# Patient Record
Sex: Male | Born: 1945 | Race: White | Hispanic: No | Marital: Single | Smoking: Current every day smoker
Health system: Southern US, Community
[De-identification: ages and names within clinical notes are randomized; demographics above are authoritative.]

## PROBLEM LIST (undated history)

## (undated) ENCOUNTER — Emergency Department (HOSPITAL_COMMUNITY): Discharge status: Absent without leave | Payer: Medicare Other

## (undated) DIAGNOSIS — I4891 Unspecified atrial fibrillation: Secondary | ICD-10-CM

## (undated) DIAGNOSIS — E785 Hyperlipidemia, unspecified: Secondary | ICD-10-CM

## (undated) DIAGNOSIS — K922 Gastrointestinal hemorrhage, unspecified: Secondary | ICD-10-CM

## (undated) DIAGNOSIS — Y92129 Unspecified place in nursing home as the place of occurrence of the external cause: Secondary | ICD-10-CM

## (undated) DIAGNOSIS — I10 Essential (primary) hypertension: Secondary | ICD-10-CM

## (undated) DIAGNOSIS — I442 Atrioventricular block, complete: Secondary | ICD-10-CM

## (undated) DIAGNOSIS — Z8669 Personal history of other diseases of the nervous system and sense organs: Secondary | ICD-10-CM

## (undated) DIAGNOSIS — F32A Depression, unspecified: Secondary | ICD-10-CM

## (undated) DIAGNOSIS — J690 Pneumonitis due to inhalation of food and vomit: Secondary | ICD-10-CM

## (undated) DIAGNOSIS — S72043A Displaced fracture of base of neck of unspecified femur, initial encounter for closed fracture: Secondary | ICD-10-CM

## (undated) DIAGNOSIS — I482 Chronic atrial fibrillation, unspecified: Secondary | ICD-10-CM

## (undated) DIAGNOSIS — K259 Gastric ulcer, unspecified as acute or chronic, without hemorrhage or perforation: Secondary | ICD-10-CM

## (undated) DIAGNOSIS — I428 Other cardiomyopathies: Secondary | ICD-10-CM

## (undated) DIAGNOSIS — M109 Gout, unspecified: Secondary | ICD-10-CM

## (undated) DIAGNOSIS — R079 Chest pain, unspecified: Secondary | ICD-10-CM

## (undated) DIAGNOSIS — D369 Benign neoplasm, unspecified site: Secondary | ICD-10-CM

## (undated) DIAGNOSIS — Z765 Malingerer [conscious simulation]: Secondary | ICD-10-CM

## (undated) DIAGNOSIS — I208 Other forms of angina pectoris: Secondary | ICD-10-CM

## (undated) DIAGNOSIS — G8929 Other chronic pain: Secondary | ICD-10-CM

## (undated) DIAGNOSIS — D126 Benign neoplasm of colon, unspecified: Secondary | ICD-10-CM

## (undated) DIAGNOSIS — F329 Major depressive disorder, single episode, unspecified: Secondary | ICD-10-CM

## (undated) DIAGNOSIS — W19XXXA Unspecified fall, initial encounter: Secondary | ICD-10-CM

## (undated) DIAGNOSIS — I700 Atherosclerosis of aorta: Secondary | ICD-10-CM

## (undated) DIAGNOSIS — C801 Malignant (primary) neoplasm, unspecified: Secondary | ICD-10-CM

## (undated) DIAGNOSIS — N400 Enlarged prostate without lower urinary tract symptoms: Secondary | ICD-10-CM

## (undated) DIAGNOSIS — C8590 Non-Hodgkin lymphoma, unspecified, unspecified site: Secondary | ICD-10-CM

## (undated) HISTORY — PX: APPENDECTOMY: SHX54

## (undated) HISTORY — PX: GASTRIC RESECTION: SHX5248

## (undated) HISTORY — PX: PARTIAL COLECTOMY: SHX5273

## (undated) HISTORY — PX: ESOPHAGOGASTRODUODENOSCOPY: SHX1529

## (undated) HISTORY — PX: PACEMAKER PLACEMENT: SHX43

## (undated) HISTORY — PX: CHOLECYSTECTOMY: SHX55

## (undated) HISTORY — PX: PACEMAKER GENERATOR CHANGE: SHX5998

## (undated) HISTORY — DX: Non-Hodgkin lymphoma, unspecified, unspecified site: C85.90

## (undated) HISTORY — DX: Gastric ulcer, unspecified as acute or chronic, without hemorrhage or perforation: K25.9

## (undated) HISTORY — DX: Benign neoplasm of colon, unspecified: D12.6

---

## 2006-10-26 ENCOUNTER — Encounter (INDEPENDENT_AMBULATORY_CARE_PROVIDER_SITE_OTHER): Payer: Self-pay | Admitting: Internal Medicine

## 2006-10-26 ENCOUNTER — Ambulatory Visit: Payer: Self-pay | Admitting: Cardiology

## 2006-10-26 ENCOUNTER — Inpatient Hospital Stay (HOSPITAL_COMMUNITY): Admission: EM | Admit: 2006-10-26 | Discharge: 2006-11-14 | Payer: Self-pay | Admitting: Emergency Medicine

## 2006-10-28 ENCOUNTER — Ambulatory Visit: Payer: Self-pay | Admitting: Oncology

## 2006-10-30 ENCOUNTER — Ambulatory Visit: Payer: Self-pay | Admitting: Internal Medicine

## 2006-11-04 ENCOUNTER — Encounter: Payer: Self-pay | Admitting: Internal Medicine

## 2006-12-24 ENCOUNTER — Ambulatory Visit: Payer: Self-pay | Admitting: Internal Medicine

## 2006-12-24 ENCOUNTER — Encounter: Payer: Self-pay | Admitting: Internal Medicine

## 2006-12-24 DIAGNOSIS — I700 Atherosclerosis of aorta: Secondary | ICD-10-CM

## 2006-12-24 DIAGNOSIS — F8189 Other developmental disorders of scholastic skills: Secondary | ICD-10-CM | POA: Insufficient documentation

## 2006-12-24 DIAGNOSIS — I4891 Unspecified atrial fibrillation: Secondary | ICD-10-CM

## 2006-12-24 DIAGNOSIS — I428 Other cardiomyopathies: Secondary | ICD-10-CM

## 2006-12-24 DIAGNOSIS — Z9189 Other specified personal risk factors, not elsewhere classified: Secondary | ICD-10-CM

## 2006-12-24 DIAGNOSIS — I259 Chronic ischemic heart disease, unspecified: Secondary | ICD-10-CM

## 2006-12-24 DIAGNOSIS — I442 Atrioventricular block, complete: Secondary | ICD-10-CM

## 2006-12-24 DIAGNOSIS — N189 Chronic kidney disease, unspecified: Secondary | ICD-10-CM

## 2006-12-24 DIAGNOSIS — F321 Major depressive disorder, single episode, moderate: Secondary | ICD-10-CM | POA: Insufficient documentation

## 2006-12-24 DIAGNOSIS — Z9089 Acquired absence of other organs: Secondary | ICD-10-CM

## 2006-12-24 DIAGNOSIS — D696 Thrombocytopenia, unspecified: Secondary | ICD-10-CM | POA: Insufficient documentation

## 2006-12-24 HISTORY — DX: Atherosclerosis of aorta: I70.0

## 2006-12-24 HISTORY — DX: Atrioventricular block, complete: I44.2

## 2006-12-24 HISTORY — DX: Other cardiomyopathies: I42.8

## 2006-12-24 LAB — CONVERTED CEMR LAB
Albumin: 4 g/dL (ref 3.5–5.2)
Alkaline Phosphatase: 76 units/L (ref 39–117)
BUN: 22 mg/dL (ref 6–23)
Basophils Absolute: 0 10*3/uL (ref 0.0–0.1)
CO2: 30 meq/L (ref 19–32)
Eosinophils Relative: 3 % (ref 0–5)
Glucose, Bld: 107 mg/dL — ABNORMAL HIGH (ref 70–99)
HCT: 37.5 % — ABNORMAL LOW (ref 39.0–52.0)
Lymphocytes Relative: 41 % (ref 12–46)
Lymphs Abs: 2.4 10*3/uL (ref 0.7–3.3)
Magnesium: 2 mg/dL (ref 1.5–2.5)
Neutro Abs: 2.8 10*3/uL (ref 1.7–7.7)
Platelets: 159 10*3/uL (ref 150–400)
Potassium: 4.9 meq/L (ref 3.5–5.3)
RBC Folate: 1282 ng/mL — ABNORMAL HIGH (ref 180–600)
Total Bilirubin: 0.3 mg/dL (ref 0.3–1.2)
Vitamin B-12: 634 pg/mL (ref 211–911)
WBC: 5.8 10*3/uL (ref 4.0–10.5)

## 2007-01-02 ENCOUNTER — Ambulatory Visit: Payer: Self-pay

## 2007-04-07 ENCOUNTER — Ambulatory Visit: Payer: Self-pay | Admitting: Internal Medicine

## 2007-09-30 ENCOUNTER — Emergency Department (HOSPITAL_COMMUNITY): Admission: EM | Admit: 2007-09-30 | Discharge: 2007-09-30 | Payer: Self-pay | Admitting: Emergency Medicine

## 2007-11-03 ENCOUNTER — Ambulatory Visit: Payer: Self-pay | Admitting: Internal Medicine

## 2010-02-06 ENCOUNTER — Encounter (INDEPENDENT_AMBULATORY_CARE_PROVIDER_SITE_OTHER): Payer: Self-pay | Admitting: *Deleted

## 2010-03-08 ENCOUNTER — Encounter: Payer: Self-pay | Admitting: Internal Medicine

## 2010-03-09 ENCOUNTER — Encounter: Payer: Self-pay | Admitting: Internal Medicine

## 2010-03-14 ENCOUNTER — Ambulatory Visit: Payer: Self-pay | Admitting: Internal Medicine

## 2010-07-12 NOTE — Cardiovascular Report (Signed)
Summary: Office Visit   Office Visit   Imported By: Roderic Ovens 04/09/2010 12:51:00  _____________________________________________________________________  External Attachment:    Type:   Image     Comment:   External Document

## 2010-07-12 NOTE — Procedures (Signed)
Summary: Cardiology Device Clinic   Current Medications (verified): 1)  Adult Aspirin Ec Low Strength 81 Mg  Tbec (Aspirin) .... Take 1 Tablet By Mouth Once A Day 2)  Carvedilol 3.125 Mg  Tabs (Carvedilol) .... Take 1 Tablet By Mouth Two Times A Day 3)  Zyprexa 5 Mg  Tabs (Olanzapine) .... Take 1 Tablet By Mouth Once A Day At Bedtime 4)  Citalopram Hydrobromide 20 Mg  Tabs (Citalopram Hydrobromide) .... Take 1 Tablet By Mouth Once A Day in Am 5)  Mag-Ox 400 400 Mg  Tabs (Magnesium Oxide) .... Take 1 Tablet By Mouth Two Times A Day 6)  Os-Cal 500 + D 500-200 Mg-Unit  Tabs (Calcium Carbonate-Vitamin D) .... Take 1 Tablet By Mouth Two Times A Day 7)  Docusate Sodium 100 Mg  Caps (Docusate Sodium) .... Take 1 Tablet By Mouth Once A Day At Bedtime As Needed For Constipation  Allergies (verified): No Known Drug Allergies  PPM Specifications Following MD:  Sherryl Manges, MD     PPM Vendor:  St Jude     PPM Model Number:  [id number]     PPM Serial Number:  [id number] PPM DOI:  10/31/2006     PPM Implanting MD:  Lewayne Bunting, MD  Lead 1    Location: RV     DOI: 01/21/1990     Model #: [id number]-60     Serial #: [id number]     Status: active  PPM Follow Up Battery Voltage:  2.78 V     Battery Est. Longevity:  >10 YRS     Right Ventricle  Amplitude: 6.9 mV, Impedance: 372 ohms, Threshold: 0.875 V at 0.5 msec  Episodes MS Episodes:  0     Ventricular High Rate:  0     Ventricular Pacing:  13%  Parameters Mode:  VVI     Lower Rate Limit:  50     Next Cardiology Appt Due:  09/10/2010 Tech Comments:  NORMAL DEVICE FUNCTION. NO EPISODES SINCE LAST CHECK. ROV IN 6 MTHS W/DEVICE CLINIC. Vella Kohler  March 14, 2010 8:54 AM

## 2010-07-12 NOTE — Cardiovascular Report (Signed)
Summary: Certified Letter Signed - Other (no appt. made)  Certified Letter Signed - Other (no appt. made)   Imported By: Debby Freiberg 04/17/2010 13:07:19  _____________________________________________________________________  External Attachment:    Type:   Image     Comment:   External Document

## 2010-07-12 NOTE — Letter (Signed)
Summary: Device-Delinquent Check  [facility], Main Office  [address]   [place], [place] [address]   Phone: [phone]  Fax: [phone]     February 06, 2010 MRN: [id number]   [name] [address] [place], [place]  [address]   Dear [name],  According to our records, you have not had your implanted device checked in the recommended period of time.  We are unable to determine appropriate device function without checking your device on a regular basis.  Please call our office to schedule an appointment, with Dr. Graciela Husbands,  as soon as possible.  If you are having your device checked by another physician, please call us so that we may update our records.  Thank you,  Altha Harm, LPN  February 06, 2010 8:42 AM  [facility] certified mail

## 2010-10-23 NOTE — Discharge Summary (Signed)
NAMEJULLIAN, [name]                 ACCOUNT NO.:  [id number]   MEDICAL RECORD NO.:  [id number]          PATIENT TYPE:  INP   LOCATION:  A209                          FACILITY:  [facility]   PHYSICIAN:  Dorris Singh, DO    DATE OF BIRTH:  1945/12/20   DATE OF ADMISSION:  10/25/2006  DATE OF DISCHARGE:  LH                               DISCHARGE SUMMARY   DISCHARGE DATE:  May be Oct 30, 2006.  The patient will be discharged  via [facility].   ADMISSION DIAGNOSES:  1. Dehydration.  2. Altered mental status.  3. Atrial fibrillation.  4. Status post pacemaker.  5. Malnutrition.  6. Patient was homeless, indigent.   DISCHARGE DIAGNOSES:  1. Atrial fibrillation with AV block.  2. Malnutrition.  3. Behavioral disorder.  4. Hypokalemia.   While the patient was here, he was seen by several specialists, the  consults include:  1. Cardiology.  2. Nephrology.  3. Hematology.   Procedures that were done while the patient was here include:  1. Abdominal series.  2. CT of the chest.  3. Chest x-ray.  4. CT of the head without contrast.  5. CT of the abdomen without contrast.  These tests were done.  The abdominal x-ray and chest x-ray were done on  Oct 26, 2006.  And, the CT of the abdomen, head, and chest were all done  on Oct 27, 2006.   HISTORY AND PHYSICAL:  [name] was a 65 year old [name] with an  unknown past medical history who was brought here by EMS and police.  Per the report of EMS and the emergency room physician, the patient was  living with his girlfriend till recently, when his girlfriend was placed  in a nursing home.  He was forced out of his girlfriend's home by his  girlfriend's daughter because he was unable to maintain his personal  hygiene.  He was at the [facility] and the owner of the [facility] called the police and EMS brought him to the emergency room.  It  was stated, the patient had not eaten for several days.  Apparently, he  had been  living in a barn off and on.  It is not known if he was taking  any medication.  Upon admission the patient was very weak and unable to  give any responses at that point in time.   While he was in the hospital, due to his labs, the patient was  determined to be in renal failure possibly due to severe dehydration  with an elevated BUN and creatinine.  He was hydrated and then assessed  by nutritional services and given the appropriate diet.  He also was  sent for multiple studies.  A CT of the head, chest, and abdomen.  Cardiology followed him regarding his AFib which was controlled on  medication.  The patient still would not answer questions appropriately,  would tell people that he just wanted to sleep but as he became more  hydrated and his renal function returned, the patient became more  responsive.  At  that point in time it was decided that the patient  should be evaluated by our ACT Team and social services for possible  placement due to his emaciated appearance.  It was determined that maybe  PT OT should also consult and participate.  While the patient was here,  he experienced several pauses of asymptomatic bradycardia as well as  several AV nodal pauses.  It was determined that the patient's pacemaker  was not working and that here at [facility] the cardiologists  do not place pacemakers, that the patient would need to be transferred.  Also, from the ACT Team evaluation it was recommended that the patient  be evaluated by psychiatric services.  [facility] does not have  these services here, so it was determined that the patient should be  transferred via [facility] and that I spoke with Dr. Renae Fickle and the patient  would be accepted on their team A and that the patient would be  transferred to be evaluated by these services at [facility].      Dorris Singh, DO  Electronically Signed     CB/MEDQ  D:  10/30/2006  T:  10/30/2006  Job:  [id number]

## 2010-10-23 NOTE — Consult Note (Signed)
NAMEJUMAR, GREENSTREET NO.:  [id number]   MEDICAL RECORD NO.:  [id number]          PATIENT TYPE:  INP   LOCATION:  2036                         FACILITY:  [facility]   PHYSICIAN:  Antonietta Breach, M.D.  DATE OF BIRTH:  [birth year]   DATE OF CONSULTATION:  11/11/2006  DATE OF DISCHARGE:                                 CONSULTATION   [name] continues with very low energy, social withdrawal, poverty of  speech, lying in his hospital bed.  He does acknowledge depressed mood  and poor appetite as well as lack of interests.  He denies any  hallucinations.  He does appear to have some suspiciousness, however,  there is no evidence that he is having systematic delusions or frank  paranoia.   LABORATORY DATA:  WBC 6.5, hemoglobin 10.6, platelet count 174,000.  On  Nov 04, 2006: BUN 24, creatinine 0.88.   MENTAL STATUS EXAM:  [name] is lying in the left lateral decubitus  position in his hospital bed with poor eye contact.  On orientation  testing he does not know the year, he looks up on the wall, finds the  calendar to name the month.  He does know he is in the hospital.  He is  oriented to person.  His thought process is coherent.  Thought content,  as described above, there is poverty of speech.  Affect is constricted.  Mood is irritable.  Judgment is impaired.  He clearly lacks the  appreciation of his condition as well as the environmental support that  would be required for sustaining life.   ASSESSMENT:  1. 293.83  Mood disorder not otherwise specified, depressed.  2. Rule out 296.33  Major depressive disorder, recurrent, severe.  3. Rule out psychotic disorder not otherwise specified, 293.81 with      delusions.  As mentioned above, there is no clear evidence that the      patient is having hallucinations.  He does appear suspicious but      not to the degree of fixed paranoia or systematic delusions.   [name] does demonstrate cognitive impairment as well as  impairment in  the ability to appreciate his condition and the environmental support  that would be required.  He does not have the capacity for informed  consent.   RECOMMENDATIONS:  1. Would start Celexa for antidepression at 5 mg q. a.m., if      tolerated, with an increase by 5 mg per day to an initial trial      dose of 20 mg q. a.m.  2. Would continue to monitor the patient for the need to start an      antipsychotic; however, will defer this at this point.      Antonietta Breach, M.D.  Electronically Signed     JW/MEDQ  D:  11/11/2006  T:  11/11/2006  Job:  [id number]

## 2010-10-23 NOTE — Assessment & Plan Note (Signed)
[facility]                         ELECTROPHYSIOLOGY OFFICE NOTE   NAME:MABESamarth, [name]                        MRN:          [id number]  DATE:11/03/2007                            DOB:          03/22/1946    [name] is status post pacemaker implantation for complete heart block  in the setting of atrial fibrillation.  He denies problems with  lightheadedness.  He has chronic shortness of breath.  He continues to  smoke.   His other complaint is sleeping disturbance.   MEDICATIONS:  1. Carvedilol 3.125 b.i.d.  2. Aspirin.  3. Zyprexa.   PHYSICAL EXAMINATION:  VITAL SIGNS:  Blood pressure 128/81, his pulse  was 76.  LUNGS:  Clear.  CARDIAC:  His heart sounds were regular.  EXTREMITIES:  Without edema.  His weight was 118, up 10 pounds since October.   Interrogation of his St. Jude Verity pulse generator demonstrates an R-  wave of 5 with impedance of 391 and a threshold of 1 volt at 0.5.   IMPRESSION:  1. Atrial fibrillation - permanent.  2. Complete heart block/bradycardia.  3. Status post pacemaker for the above.  4. Chest pain, declining further workup.  5. No Coumadin secondary to patient unwillingness.   [name] is stable from an arrhythmia point of view.  Risk factors  remain but modification is not something he is willing to undertake.   We will see him again in six months' time.     [name], MD, [facility]  Electronically Signed    SCK/MedQ  DD: 11/03/2007  DT: 11/03/2007  Job #: [id number]   cc:   [facility]

## 2010-10-23 NOTE — Op Note (Signed)
NAMEMALIQ, [name]                 ACCOUNT NO.:  [id number]   MEDICAL RECORD NO.:  [id number]          PATIENT TYPE:  INP   LOCATION:  2036                         FACILITY:  [facility]   PHYSICIAN:  Doylene Canning. Ladona Ridgel, MD    DATE OF BIRTH:  Sep 10, 1945   DATE OF PROCEDURE:  10/31/2006  DATE OF DISCHARGE:                               OPERATIVE REPORT   PROCEDURE PERFORMED:  Removal of a previously implanted single-chamber  pacemaker and insertion of a new single-chamber pacemaker secondary to  the device being at end of life.   SURGEON:  Doylene Canning. Ladona Ridgel, MD   I. INTRODUCTION:  The patient is a 65 year old male with a history of  symptomatic bradycardia and chronic atrial fibrillation, who was  admitted to the hospital and had periods of bradycardia along with  multiple other medical problems and is transferred down for pacemaker  generator change.   II. PROCEDURE:  After informed consent was obtained, the patient was  taken to the diagnostic EP lab in the fasting state.  After the usual  preparation and draping, intravenous fentanyl and midazolam were given  for sedation.  Thirty milliliters of lidocaine were infiltrated into the  right infraclavicular region over the old pacemaker insertion site.  A 5-  cm incision was carried out over this region and electrocautery utilized  to dissect down to the pacemaker pocket.  The generator was removed  without difficulty.  The old ventricular pacing lead was evaluated.  The  R waves were measured between 4 and 5 mV and the pacing impedance  (bipolar) was 764 ohms with a threshold of 1.6 volts at 0.5  milliseconds.  With these satisfactory parameters, the new St. Jude  Verity ADx XL SR single-chamber pacemaker, serial number [id number], was  connected to the previous pacing lead and placed back in the  subcutaneous pocket.  The pocket was irrigated with kanamycin and the  incision closed with a layer of 2-0 Vicryl followed by a layer of 3-0  Vicryl,  followed by a layer of 4-0 Vicryl.  Benzoin was painted on the  skin, Steri-Strips were applied and a pressure dressing was placed, and  the patient was returned to the his room in satisfactory condition.   III. COMPLICATIONS:  There were no immediate procedural complications.   IV. RESULTS:  This demonstrated successful pacemaker generator removal  and insertion in a patient with symptomatic bradycardia.      Doylene Canning. Ladona Ridgel, MD  Electronically Signed     GWT/MEDQ  D:  10/31/2006  T:  10/31/2006  Job:  [id number]   cc:   Gerrit Friends. Dietrich Pates, MD, [facility]

## 2010-10-23 NOTE — H&P (Signed)
[name], [name]                 ACCOUNT NO.:  [id number]   MEDICAL RECORD NO.:  [id number]          PATIENT TYPE:  INP   LOCATION:  A209                          FACILITY:  [facility]   PHYSICIAN:  Marcello Moores, MD   DATE OF BIRTH:  1946-04-10   DATE OF ADMISSION:  10/25/2006  DATE OF DISCHARGE:  LH                              HISTORY & PHYSICAL   PRIMARY CARE PHYSICIAN:  Unassigned.   CHIEF COMPLAINT:  He was brought by EMS and police from [facility].   HISTORY OF PRESENT ILLNESS:  [name] be is a 65 year old male patient  with unknown past medical history who was brought today early in the  morning by EMS and police.  Per the report of the EMS, police, and  emergency room physician, the patient was living with his girlfriend  until recently, and his girlfriend was placed in a nursing home.  He was  forced out of his girlfriend's and her daughter's house because he was  unable to maintain his personal hygiene.  He was at [facility], and  the owner of the [facility] called the police, and EMS brought him  to the emergency room. The patient was not eating for the last several  days, and he was dehydrated, and he was unable to answer for the  interview.  At this time, we do not know if he was taking any  medication, if he was following up with any medical doctor. The patient  is alert but very lethargic and weak and unable to give any reasonable  medical or social history.  Currently, the computer system is also in  repair, and I was unable to have access if he has any past medical  history in our hospital.   REVIEW OF SYSTEMS:  A 10-point review of system is limited, as I  mentioned in the HPI.   ALLERGIES:  Unknown.   PAST MEDICAL HISTORY:  Unknown.   SOCIAL HISTORY:  Homeless currently, otherwise unknown.   PHYSICAL EXAMINATION:  GENERAL:  The patient is lying on the bed, very  emaciated and cachectic without any respiratory distress, unable to give  any  reasonable history during the attempted interview.  VITAL SIGNS:  Initial blood pressure is 136/80. Pulse rate is 92.  Temperature is 99.4, and respiratory rate is 20 per minute. Saturation  is 96% on room air.  HEENT: He has pink conjunctivae.  Nonicteric sclera.  Very dry buccal  mucosa. Sunken periorbital area and prominent fascial bones with poor  hygiene.  NECK:  Supple.  CHEST: Good air entry bilaterally.  No wheezes or rhonchi.  CARDIOVASCULAR:  S1-S2 heard, irregular.  No murmur is appreciated, and  there is probable pacemaker on the right side of the chest.  ABDOMEN:  Scaphoid.  No organomegaly is appreciated in the area of  tenderness.  Normoactive bowel sounds.  EXTREMITIES:  emaciated.  Old scar on the right knee area.  CNS:  He is pale, alert but very weak and lethargic, but there is not  any neurological deficit on examination.   LABORATORY DATA:  Chemistries:  Sodium is 148, potassium 3.9, chloride  104, bicarb 23, and glucose is 251. BUN is 85, creatinine 1.97, calcium  9.9. Total protein is 8.6. Albumin is 4. Alcohol level is less than 5.  On the CBC, white blood cell count is 7.8, hemoglobin  15.38, hematocrit  48, RDW 14.5, platelet count 151, neutrophil 76%. CK-MB is 1.9, and  troponin is 0.06.   Chest x-ray:  COPD.  Otherwise there is no sign of infiltrate.   ASSESSMENT:  1. Dehydration with elevated BUN and creatinine, elevated sodium      level, elevated hemoglobin and hematocrit, and elevated protein and      albumin.  Probably all these elevated lab results are related to      dehydration. We will rehydrate him with IV fluid, and we will      monitor the labs.  2. Altered mental status.  The patient is alert, but he is very      lethargic and not well oriented.  Probably this is related to his      dehydration.  3. Atrial fibrillation.  The patient received IV dose of Cardizem in      the emergency room,  and the rate is well-controlled.  We will       continue with p.o. Cardizem and will consult cardiology also for      long-term management of his problem.  4. Status post pacemaker.  5. malnutrition. The patient is very emaciated and cachectic. Probably      this is secondary to food deprivation, but will investigate also      for possible medical cause of the weight loss or cancer with CT OF      ABDOMEN CHEST AND BRAIN .  6. Homeless, and he needs Presenter, broadcasting. Probably he might      need also psychiatric evaluation.      Marcello Moores, MD  Electronically Signed     MT/MEDQ  D:  10/26/2006  T:  10/26/2006  Job:  [id number]

## 2010-10-23 NOTE — Group Therapy Note (Signed)
[name], [name]                 ACCOUNT NO.:  [id number]   MEDICAL RECORD NO.:  [id number]          PATIENT TYPE:  INP   LOCATION:  A209                          FACILITY:  [facility]   PHYSICIAN:  Dorris Singh, DO    DATE OF BIRTH:  07/10/1945   DATE OF PROCEDURE:  DATE OF DISCHARGE:                                 PROGRESS NOTE   PROGRESS REPORT   This is for a note of [facility].   The patient is seen, examined, chart reviewed.  Have been monitoring  patient since AV block.  Has not had another episode.  Patient has been  seen by social work and cardiology.  Cardiology notes state that  possible replacement of pacemaker is in order due to the pacemaker not  functioning during a pause the patient had on 05/20.  Also spoke with  social work services; they stated that they could find placement for  patient here locally in town that can assist him with his medical needs  as well as his nutritional needs.   Vital signs for [name] are 97.5, 65, 20, 100/58.  Generally he is a  cachectic, emaciated, [name], who is in no acute distress, who  answers some questions, but will close eyes and not answer some.  HEENT  EOMI.  PERRLA.  The patient has a beard, and hair is in disarray.  No  lice or parasites noted.  Cardio regular rate and rhythm, without  murmur.  Pulmonary clear to auscultation bilaterally.  Abdomen flat,  nontender.  No organomegaly.  Bowel sounds times 4.  Extremities are  thin, cachectic, pulse within all 4 quadrants.   Labs that he had done, for today he had a CBC of WBC 5.2, hemoglobin  10.3, hematocrit 28.5, platelet 67.  His ESR was 40.  Had a CMP done.  Sodium was 142, potassium 2.9 which is low, chloride 106.  CO2 32.  Glucose 118.  BUN 9, and creatinine 0.5.  Also, his albumin __________  was 2.0 which is low, and his calcium 7.3 which is low.  His phosphorus  was 2.2.   IMPRESSION AND PLAN:  Renal failure, which is currently stable.  Hypokalemia.  We  will replace with KCl, and decrease IV rate.  AFib with  block.  Cardiology recommends pacemaker replacement.  Await consent of  patient.  Malnutritional state.  Per nutritional assessment, patient has  other severe protein caloric malnutrition.  We will continue with the  recommendations of the nutritionist for meal planning.  Social work has  found placement for patient at a local facility, a nursing home  facility; await patient consent.  PT OT continues participation.  Await  ACT team evaluation of the patient for any further recommendation.  We  will continue to monitor, and we will replace calcium at this time with  oral supplementation.      Dorris Singh, DO  Electronically Signed    CB/MEDQ  D:  10/29/2006  T:  10/29/2006  Job:  [id number]

## 2010-10-23 NOTE — Discharge Summary (Signed)
NAMEMERL, BOMMARITO                 ACCOUNT NO.:  [id number]   MEDICAL RECORD NO.:  [id number]          PATIENT TYPE:  INP   LOCATION:  2036                         FACILITY:  [facility]   PHYSICIAN:  Beckey Rutter, MD  DATE OF BIRTH:  [birth year]   DATE OF ADMISSION:  10/30/2006  DATE OF DISCHARGE:                               DISCHARGE SUMMARY   ADDENDUM TO DISCHARGE SUMMARY [phone]   1. The patient will be discharged on Zyprexa p.o. 5 mg at night, which      will be added to the above list of the medications.  2. The patient's history of HIV will be added to the additional      diagnoses.  The HIV preliminary tests seem to be stable as per the      medical chart.  During hospitalization, he remained stable in that      medical problem and the recommendation is to follow up with      infectious disease or primary for further assessment and manage his      HIV.      Beckey Rutter, MD  Electronically Signed     EME/MEDQ  D:  11/11/2006  T:  11/11/2006  Job:  [id number]

## 2010-10-23 NOTE — Consult Note (Signed)
[name], [name]                 ACCOUNT NO.:  [id number]   MEDICAL RECORD NO.:  [id number]          PATIENT TYPE:  INP   LOCATION:  A209                          FACILITY:  [facility]   PHYSICIAN:  Ladona Horns. Mariel Sleet, MD  DATE OF BIRTH:  [birth year]   DATE OF CONSULTATION:  10/27/2006  DATE OF DISCHARGE:                                 CONSULTATION   DIAGNOSES:  1. Thrombocytopenia, though he actually had a normal platelet count      yesterday.  His platelets from today was found to be low.  2. Possible mental health illness.  3. Profound cachexia.  4. Renal insufficiency, primarily prerenal, with a BUN of 85,      creatinine 1.97.  5. Elevated total protein, and we do need to exclude myeloma, however.   HISTORY:  This is a somewhat quiet gentleman who was brought to the  hospital by the police.  He [place], does not remember how many years he is done that, but he was  supposedly living with a young woman pillow she became placed in a  nursing home herself.   I believe he showed up at [facility] and that is where the police  picked him up and brought him to the hospital.  He does not remember the  last time he has eaten and he has not been able to answer the interview  at the time of the admission, and he is not a very good reporter of  circumstances now, either.   He did not know the year.  He did not know the last time he had  something to eat or drink.  He knew that he smoked years ago.  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  He is unsure of  allergies, etc., etc.   He was married, he states, at one time.  Does not remember how long ago  that was.  He does have a sister brother, he states, in [place].  He has  not seen them for 20 years or more.  His parents both died of old age.   PHYSICAL EXAMINATION:  GENERAL:  He is  cachectic-appearing gentleman in  no acute distress presently.  HEENT:  He has a full head of hair and full beard and mustache, some of  which is gray.  His pupils do respond to light.  He has no teeth.  Tongue appears unremarkable.  LUNGS:  Actually clear.  HEART:  A regular rhythm and rate without distinct murmur, rub or  gallop.  SKIN:  He has some benign-appearing lesions on his forehead and around  the right eye.  ABDOMEN:  No apparent splenomegaly.  LYMPHATIC:  He has a small lymph node about 1.5 cm in the left inguinal  area, but it does not feel pathologic.  EXTREMITIES:  He has no peripheral edema.  Pulses in his feet are very  weak.  CHEST:  This gentleman also has a pacemaker in his right upper chest  wall.   This gentleman is possibly thrombocytopenic.  That remains be seen with  a repeat value.  He has an elevated total protein, which clearly needs  an SPEP in order to evaluate.  He needs, of course, hydration for his  prerenal azotemia  and he does have some protein in his urine, which will again be  evaluated with an SPEP and a quantitative IFE from his serum, but I  suspect he may need a psychiatric intervention and evaluation to see if  he has occult schizophrenia or something like that that needs further  attention.      Ladona Horns. Mariel Sleet, MD  Electronically Signed     ESN/MEDQ  D:  10/27/2006  T:  10/28/2006  Job:  [id number]

## 2010-10-23 NOTE — Consult Note (Signed)
NAMECURTIES, CONIGLIARO                 ACCOUNT NO.:  [id number]   MEDICAL RECORD NO.:  [id number]          PATIENT TYPE:  INP   LOCATION:  2036                         FACILITY:  [facility]   PHYSICIAN:  Antonietta Breach, M.D.  DATE OF BIRTH:  [birth year]   DATE OF CONSULTATION:  10/31/2006  DATE OF DISCHARGE:                                 CONSULTATION   REQUESTING PHYSICIAN:  Lonia Blood, M.D.   REASON FOR CONSULTATION:  Impaired mental status.   HISTORY OF PRESENT ILLNESS:  Mr. Banjamin Stovall is a 65 year old male  admitted to the [facility] on Oct 30, 2006.   Mr. Mayberry was noncompliant with pacemaker followup and presented to the  [facility] after being transferred from [facility] for a  pacemaker change.  He had originally presented to [facility]  for dehydration and altered mental status as well as generalized failure  to thrive.  He has been indigent and homeless, however, was living in a  house for indigent individuals called the [facility].   On the medical ward, the patient has become very frustrated when he does  not understand why the nurses are coming in to tend to him.  Last night  he yelled and was wanting to be left alone.  Today, he has tolerated the  undersigned's visit without yelling.  He denies any depressed mood.  He  clearly has decreased energy.  He is oriented to all spheres and he does  have short-term recall.  When asked if he has auditory hallucinations,  he states that he does not know.  He will not commit to certain  questions and pauses for long periods of time.   Efforts to help the patient provide better history verbally have failed;  he continues to have poverty of speech.   PAST PSYCHIATRIC HISTORY:  It is reported that the patient was forced  out of his girlfriend and daughter's house because he would not maintain  his personal hygiene.  He then went to the [facility].  The patient  denies any history of mental treatment.   He will state I don't know  when asked about a history of hallucinations.   He will not provide any additional history.  Upon medical record review,  including the electronic medical record, there is no additional history  about his past mental status.   FAMILY PSYCHIATRIC HISTORY:  None known.   SOCIAL HISTORY:  Marital status:  The patient has stated that he was  married in 1 of the exam interviews; however, he does not state  specifically that he is divorced.  He has stated that he has not seen  his sister or brother for 20 years or more; please see the discussion  above regarding a recent girlfriend.  The patient denies any illegal  drugs or alcohol.  The patient denies biological children.  Occupation:  In the medical record, he is listed as a retired Games developer.  He  does have a history of drinking alcohol; however, the extent of his  drinking is unknown.   GENERAL MEDICAL  PROBLEMS:  1. Recent dehydration.  2. Altered mental status.  3. Atrial fibrillation, status post pacemaker.  4. Malnutrition.  5. Hypokalemia.  6. Cardiomyopathy with ejection fraction of 45%.  7. Severe aortic atherosclerosis and mural thrombosis per CT scan of      the abdomen in May 2008.  Patchy peribronchial densities in both      lungs in the CT scan.  8. CT scan of the abdomen on May 19 had findings suggestive of mild      ascites, periportal edema, mild biliary dilatation and a history of      cholecystectomy.  9. The patient also has shown normocytic anemia and mild      thrombocytopenia.  10.The patient's TSH was normal at 0.36.  His HIV was nonreactive.      B12 was 680; folic acid was 9.3.  11.He has protein calorie malnutrition, acute renal failure which was      resolved with IV fluids at [facility].   MEDICATIONS:  The [facility] is reviewed.  The patient is not on any current  psychotropics.   LABORATORY DATA:  Please see above.  The patient's current BUN is 9,  creatinine  0.58 and magnesium is 1.3.  ANA is negative.  The hepatitis  panel is completely negative.  Vitamin D was low at 14.  WBC 5.9,  hemoglobin 11.1 and platelet count 103,000.  The patient's ESR was high  at 40.  Hemoglobin A1c was within normal limits.  A drug screen of the  urine on May 18 was unremarkable.  The patient's Tylenol level was  negative.  Aspirin level was negative.  Alcohol level was negative.   Head CT without contrast on May 19 showed no apparent acute findings.   REVIEW OF SYSTEMS:  The patient has limited cooperation with this.  From  what can be gathered from the medical record and staff, is as follows:  CONSTITUTIONAL:  Afebrile.  HEAD:  No trauma.  EYES:  No known visual  changes.  EARS:  No hearing impairment.  NOSE:  No rhinorrhea.  MOUTH  AND THROAT:  No sore throat.  NEUROLOGIC:  Unremarkable.  PSYCHIATRIC:  As above.  CARDIOVASCULAR:  As above.  RESPIRATORY:  No current cough.  GASTROINTESTINAL:  Appetite is mildly decreased.  No diarrhea or  vomiting.  GENITOURINARY:  See above.  The patient's renal insufficiency  has resolved.  SKIN:  Unremarkable.  MUSCULOSKELETAL:  The patient is  emaciated.  HEMATOLOGIC/LYMPHATIC:  As above.  ENDOCRINE/METABOLIC:  As  above.   EXAMINATION:  VITAL SIGNS:  Temperature 97.1, pulse 80, respiration 18,  blood pressure 103/71, O2 saturation on room air 94%.   MENTAL STATUS EXAM:  Mr. Amirault is a 65 year old male appearing his  chronologic age, but clearly emaciated, lying in a supine position in  his hospital bed with a disheveled appearance, a very large untamed gray  beard with an aloof affect and intermittent eye contact; please see the  history of present illness.  The patient has intact orientation and did  cooperate with those questions.  He is oriented to the year, the month,  day of the month, day of the week, place and person.  His memory is intact for 3/3 objects immediate and 3/3 at 5 minutes.  His fund of  knowledge  and intelligence are grossly below average, although this is  difficult to discern, given the patient's limited historical  cooperation.  The patient's speech involves normal prosody; however,  at  times there is poverty of speech for long periods when he will not  answer certain questions.  Thought process is coherent; however, as  mentioned, there are long periods of pausing when questions are asked  and overall he has poverty of speech.  Thought content:  The patient  does have a guarded disposition.  He may be responding to internal  stimuli at the time; however, this is unclear.  Please see the history  of present illness regarding questions about auditory hallucinations.  His insight is poor.  His judgment is impaired.  Please see the  discussion below.  He demonstrates poor social reciprocity.   ASSESSMENT:   AXES I:  1. 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  2. Rule out 293.82, psychotic disorder, not otherwise specified, with      hallucinations.  3. Rule out schizophrenia, undifferentiated type, in residual phase;      please see the discussion below.   AXIS II:  Deferred.   AXIS III:  See general medical problems.   AXIS IV:  General medical; primary support group; economic.   AXIS V:  Forty.   Mr. Stradling, when discussing his medical condition, does not demonstrate  that he understands and appreciates the potential condition of his heart  without medical treatment, as well as the potential morbidity and  mortality involved.  He also demonstrates an ambivalence about his  future course of care.   He does not have the capacity for informed consent.   Mr. Glazier demonstrates currently and the available history supports the  possibility of a schizophrenia diagnosis.  He displays affective  awkwardness and lack of social reciprocity.  He also displays  ambivalence and poverty of speech.  He also has a guarded affect.    RECOMMENDATIONS:  1. If no healthcare power attorney is present, would treat any      potentially lethal conditions under the implied consent rule.  2. If no healthcare power attorney, I would recommend appointment of      legal guardian through the court system.  3. Regarding potential psychiatric treatment, if the patient's QTc is      less than 500 msec, he would be a candidate for a course of Zyprexa      starting 5 mg p.o. or IM daily in the evening; this could reduce      paranoia as well as eliminate any hallucinations if they are      present; also, this could eliminate agitation, which is clearly      disturbing to the patient as well as interfering with his bedside      care.  If the QTC remains less than 500 msec, the Zyprexa could be      increased to 10 mg nightly for an adequate trial.  4. Regarding the patient's followup, I anticipate that this patient     will require a placement facility.  He can follow up with one of      the psychiatric clinics attached to [facility], [facility]      or [facility]; also, the county mental health system is      available.  Some facilities have visiting mental health care teams      or on-site consulting psychiatrists.      Antonietta Breach, M.D.  Electronically Signed     JW/MEDQ  D:  10/31/2006  T:  11/01/2006  Job:  [id number]

## 2010-10-23 NOTE — H&P (Signed)
[name], [name] NO.:  [id number]   MEDICAL RECORD NO.:  [id number]          PATIENT TYPE:  INP   LOCATION:  2036                         FACILITY:  [facility]   PHYSICIAN:  Elliot Cousin, M.D.    DATE OF BIRTH:  [birth year]   DATE OF ADMISSION:  10/30/2006  DATE OF DISCHARGE:                              HISTORY & PHYSICAL   PRIORITY HISTORY AND PHYSICAL   CHIEF COMPLAINT:  The patient was transferred from [facility]  to [facility] for a pacemaker change.   HISTORY OF PRESENT ILLNESS:  The patient is a 65 year old man, who was  admitted at [facility] for dehydration, altered mental status,  and generalized failure to thrive.  Apparently the patient was living at  a boardinghouse and/or a house for indigent individuals, called the  [facility].  The patient apparently was not doing well at the  [facility] and was therefore transferred to [facility] for  further evaluation and management.  When the patient was evaluated in  the emergency department by the physicians at [facility], it  was found that the patient was dehydrated, malnourished, and had altered  mental status.  It was also noted that the patient was in atrial  fibrillation.  Throughout the hospitalization, the patient developed  sinus pauses, bradycardia and an AV block.  Subsequently, [facility]  Cardiology was consulted.  Per the evaluation by the cardiologist, the  patient needed a pacemaker generator change.  For additional details,  please see the discharge summary by Dr. Dorris Singh [facility]).   The patient is currently lying in bed.  He has no complaints except that  he admits to feeling bad.  When he was asked to be more specific, he  complains only of generalized weakness.  In essence, he denies headache,  dizziness, fever, chills, nausea, vomiting, diarrhea, chest pain,  palpitations, shortness of breath, abdominal pain,  painful urination,  and swelling in his legs.   PAST MEDICAL HISTORY:  (Gathered from the notes from [facility]).  1. Status post pacemaker in the past.  Unknown date of original      insertion.  2. 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  3. Atrial fibrillation, duration unknown.  4. Ischemic heart disease, status post coronary artery bypass surgery.  5. Cardiomyopathy with an ejection fraction of 45% per the two-D      echocardiogram, Oct 29, 2006.  6. Severe aortic atherosclerosis and mural thrombosis per CT scan of      the abdomen on Oct 27, 2006.  7. Patchy peribronchial densities in both of the lungs per CT scan of      the chest on Oct 27, 2006.  8. Homeless.  9. Illiterate.  10.CT scan of the abdomen on Oct 27, 2006, with findings suggestive of      mild ascites, periportal edema, mild biliary dilatation status post      cholecystectomy.  11.Normocytic anemia and mild thrombocytopenia.  The patient was      evaluated by hematologist, Dr. Mariel Sleet.  Iron studies were      ordered and revealed a total iron of 85, TIBC of 163, percent      saturation of 52, ferritin of 617, folic acid of 9.3, and Vitamin      B12 of 680.  In addition, the patient's HIV antibody was      nonreactive and his TSH was within normal limits at 0.360.  12.Protein-calorie malnutrition.  13.Hypokalemia.  14.Acute renal failure, resolved with IV fluids at [facility].  15.Question of mental illness versus dementia.   MEDICATIONS:  The active medication list was not sent over from [facility].   ALLERGIES:  No known drug allergies.   SOCIAL HISTORY:  The patient was living at [facility] for an unknown  period of time.  Apparently he had been living with his stepdaughter for  some time now.  The patient was asked to leave his stepdaughter's home  for unknown reasons.  The patient says  that he is divorced.  He has no  biological children.  He lived in [place] for a number of years.  He  worked as a Games developer in the past.  He stopped smoking many years  ago.  He recently stopped drinking alcohol.  He denies illicit drug use.   FAMILY HISTORY:  Both of his parents died in their 61s of old age.  He  has one brother and one sister.  He does not know them very well.   REVIEW OF SYSTEMS:  As above in the History of Present Illness.   PHYSICAL EXAMINATION:  VITAL SIGNS:  Temperature 98.1, pulse 58,  respiratory rate 18, blood pressure 127/56, oxygen saturation 98% on  room air, weight 42 kg.  GENERAL:  The patient is a small-framed, cachectic-appearing, 60-year-  old Caucasian man, who is currently in bed in no acute distress.  HEENT:  Head is normocephalic and nontraumatic, pupils are equal, round,  and reactive to light, extraocular movements are intact, conjunctivae  are clear, sclerae are white, tympanic membranes not examined, nasal  mucosa is mildly dry, no sinus tenderness.  Oropharynx reveals no teeth.  Mucous membranes are moist.  No posterior exudates or erythema.  NECK:  Supple, no adenopathy, no thyromegaly, no bruit, no JVD.  LUNGS:  Occasional fine crackles in the bases.  Breathing nonlabored.  HEART:  Irregular irregular with bradycardia.  CHEST WALL:  Well-healed scar.  Right upper chest pacemaker intact,  nontender.  ABDOMEN:  Positive bowel sounds, soft, nontender, nondistended, no  hepatosplenomegaly, no masses palpated.  RECTAL AND GU:  Deferred.  EXTREMITIES:  Pedal pulses barely palpable.  No pretibial edema and no  pedal edema.  The patient has a well-healed scar on his right leg above  his knee (he says this is secondary to a remote chainsaw injury).  NEUROLOGIC:  The patient is alert.  He is oriented to himself and the  hospital.  He is not oriented to the month (March), and he is not oriented to the date (2006).  He is not oriented to  the [facility]).  Cranial nerves II-XII appear to be grossly intact with  exception of a decrease in hearing acuity.  Strength is grossly 5/5 in  the supine position.  Sensation is grossly intact.   ADMISSION LABORATORY DATA:  Pending for in the morning.  Pertinent lab  results from [facility] over the past two days reveal a  negative ANA, negative acute viral hepatitis panel, unremarkable protein  electrophoresis, low Vitamin D of 14, potassium of 2.8, hemoglobin of  10.7, platelets of 93, TSH within normal limits at 0.360.   ASSESSMENT:  1. Sick sinus syndrome with a need for a generator change.  The      patient has been evaluated by [facility] Cardiology and the generator      change is scheduled for in the morning, Oct 31, 2006, by Dr.      Antoine Poche.  2. Hypokalemia.  The patient is being repleted.  3. Atrial fibrillation, duration unknown.  Will defer anticoagulation      chronically to the Cardiology team.  4. Revolved acute renal failure with treatment with intravenous      fluids.  5. History of ischemic heart disease and cardiomyopathy with the      ejection fraction of 45% per recent two-D echocardiogram, Oct 29, 2006.  6. Normocytic anemia/mild thrombocytopenia. Etiology unclear. The      patient's iron studies and additional studies appear to be within      normal limits.  7. Homeless/indigent.  8. Illiterate.  9. A question of mental illness versus dementia.  The patient will      need further evaluation by psychiatrist, Dr. Jeanie Sewer.   PLAN:  1. The patient will be admitted for further evaluation and management.  2. The patient is scheduled for a pacemaker generator change on Oct 31, 2006, by Dr. Antoine Poche.  3. Consult psychiatrist, Dr. Jeanie Sewer, in the morning.  4. Consult the case manager and/or social worker for possible      placement.  5. Will defer anticoagulation to the recommendations from Cardiology.  6. Replete potassium  chloride.  Will check a magnesium level to rule      out deficiency.  7. Supportive care.  8. A multivitamin with iron.  9. Vitamin D supplementation.  10.Albuterol and Atrovent nebulizers as needed.      Elliot Cousin, M.D.  Electronically Signed     DF/MEDQ  D:  10/30/2006  T:  10/30/2006  Job:  [id number]

## 2010-10-23 NOTE — Discharge Summary (Signed)
Damon, Goodwin                 ACCOUNT NO.:  [id number]   MEDICAL RECORD NO.:  [id number]          PATIENT TYPE:  INP   LOCATION:  2036                         FACILITY:  [facility]   PHYSICIAN:  Beckey Rutter, MD  DATE OF BIRTH:  [birth year]   DATE OF ADMISSION:  10/30/2006  DATE OF DISCHARGE:  11/11/2006                               DISCHARGE SUMMARY   CHIEF COMPLAINT ON PRESENTATION:  Transfer for pacemaker surgery.   HISTORY OF PRESENT ILLNESS:  This is a 65 year old [name] with  multiple medical problems transferred from [facility] mainly  for pacemaker generator replacement which was done the day after  admission on Oct 31, 2006.   HOSPITAL COURSE:  1. The patient had the pacemaker generator changed by Dr. Lewayne Bunting      on Oct 31, 2006.  The procedure performed was removal of previously      implanted single-chamber pacemaker and insertion of a new single-      chamber pacemaker secondary to the device being at end-of-life.      The patient was seen by [facility] during this      hospitalization and now the patient's cardiology issue is stable.      The plan is to follow up with [facility] at [address] on Thursday, June 5 at 9:20 a.m.  During the      hospitalization also, the patient was evaluated for malnutrition      and he was seen by nutritionist who assessed his need and last      report was reading that he is taking sufficient food to support his      nutrition needs.  2. Altered mental status.  The patient has altered mentation which      seems to be improved compared to the description and the      examination the patient had on presentation to [facility]      and even to the presentation here at [facility].  He also      was seen by Dr. Antonietta Breach, our psychiatrist for further      evaluation and assessment.  Please refer to the psych consultation      full report dictated  by Dr. Antonietta Breach on Oct 31, 2006.  In      brief, the impression of the psych is the patient has psychiatric      disorder nonspecific as of now, but he may need to have healthcare      power of attorney.  If no healthcare power of attorney, the      psychiatrist would recommend appointment of a legal guardian      through the court system.   During the hospital stay though he remained relatively stable a few days  after the surgery, but because of the guardianship and the placement  issue the patient stayed in the hospital here.  Now he is stable for  discharge.  We would recommend the psychiatrist to reevaluate him to  assess the need for guardianship and essentially do we need to keep him  in the hospital just for that or can he go to an assisted living  facility where they can continue to pursue the guardianship issue.   DISCHARGE DIAGNOSES:  1. Status post pacemaker and status post change of generator of the      pacemaker here in [facility]  2. 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  3. Atrial fibrillation, duration unknown.  4. Ischemic heart disease, status post coronary artery bypass surgery.  5. Cardiomyopathy with ejection fraction of 45% with 2D echocardiogram      done on Oct 29, 2006.  6. Severe aortic atherosclerosis and mural thrombosis per computerized      tomography scan of the abdomen on Oct 27, 2006.  7. Patchy peribronchial densities in both of the lungs per      computerized tomography scan of the chest on Oct 27, 2006.  8. Homeless.  9. Illiterate.  10.Computerized tomography scan of the abdomen on Oct 27, 2006 with      findings suggestive of mild ascites, periportal edema, and mild      bilateral dilatation, status post cholecystectomy.  11.Normocytic anemia and mild thrombocytopenia.  The patient was      evaluated by hematologist, Dr. Mariel Sleet.  Iron studies  were      ordered and revealed a total iron of 85, total iron-binding      capacity of 163, percent saturation is 52.  Ferritin of 617 and      folic acid of 9.3.  Vitamin B12 of 680.  In addition, the patient's      human immunodeficiency virus antibody was nonreactive and his TSH      was within normal limits at 0.360.  12.Protein calorie malnutrition.  13.Hypokalemia.  14.Acute renal failure, resolved with intravenous fluids at [facility].  15.Question of mental illness versus dementia.   DISCHARGE MEDICATIONS:  1. Aspirin 81 mg p.o. daily.  2. Coreg 3.125 p.o. b.i.d.  3. Calcium carbonate one tab p.o. twice a day.  4. Ensure nutritional supplement p.o. b.i.d.  5. Ducosate 100 mg at night p.r.n.   DISCHARGE PLAN:  The patient might be discharged to assisted living  facility at this time if agreeable by psychiatrist.      Beckey Rutter, MD  Electronically Signed     EME/MEDQ  D:  11/11/2006  T:  11/11/2006  Job:  [id number]
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
## 2010-10-23 NOTE — Assessment & Plan Note (Signed)
[facility]                         ELECTROPHYSIOLOGY OFFICE NOTE   NAME:[name]                        MRN:          [id number]  DATE:04/07/2007                            DOB:          August 03, 1945    [name] underwent pacemaker generator replacement for complete heart  block in the setting of permanent atrial fibrillation a couple months  ago by Dr. Lewayne Bunting.  He is a 65 year old gentleman who resides in a  nursing home.   He has had no problems with lightheadedness.  He has chronic shortness  of breath.  He also describes exertional abdominal discomfort which is  relieved by rest.   His cardiac risk factors notable for smoking.  His lipid status is not  known.   MEDICATIONS:  1. Carvedilol 3.125 b.i.d.  2. Zyprexa.  3. Aspirin.   EXAMINATION:  His blood pressure was 121/69, his pulse was 58.  LUNGS:  Clear.  HEART SOUNDS:  Regular.  EXTREMITIES:  Without edema.   Interrogation of his [employer] pacemaker demonstrates an R wave of 3.2  with an impedance of 367, a threshold of 1 volt at 0.5, battery voltage  was 2.78, heart rate excursion was adequate.   IMPRESSION:  1. Permanent atrial fibrillation with bradycardia.  2. Status post pacer for the above.  3. Thromboembolic risk factors notable for?  4. Exertional abdominal discomfort relieved by rest.   [name] has permanent atrial fibrillation status post pacemaker  implantation, from this point of view he is doing fine.  He is not a  great historian. We do not have data regarding his LV function; more  remotely, it was normal but that was a decade ago.   I am concerned about his exertional abdominal discomfort which is  relieved by rest; this possibly is an anginal equivalent, especially  given his cardiac risk factors and specifically long-term smoking.   The patient is disinclined to stop smoking.  The patient also declines  further evaluation of his chest  discomfort.   We will see him again in the device clinic in 8 months' time.     [name], MD, [facility]  Electronically Signed    SCK/MedQ  DD: 04/07/2007  DT: 04/07/2007  Job #: [id number]   cc:   [facility]

## 2010-10-23 NOTE — Procedures (Signed)
NAMECOLLIN, [name]                 ACCOUNT NO.:  [id number]   MEDICAL RECORD NO.:  [id number]          PATIENT TYPE:  INP   LOCATION:  A209                          FACILITY:  [facility]   PHYSICIAN:  Noralyn Pick. Eden Emms, MD, FACCDATE OF BIRTH:  September 27, 1945   DATE OF PROCEDURE:  DATE OF DISCHARGE:                                ECHOCARDIOGRAM   INDICATIONS FOR PROCEDURE:  Atrial fibrillation.   RESULTS:  Left ventricular cavity size was mildly dilated. There was  mild global hypokinesis with an EF in the 45% range. There was mild left  atrial enlargement. Right sided cardiac chambers were normal. There was  a pacing catheter seen in the RV. There was mild TR.   Mitral valve was mildly thickened with mild MR. There was aortic valve  sclerosis with mild aortic insufficiency. Subcostal imaging revealed no  atrial septal defect, no source of embolus and no effusion.   M-mode measurement showed an aortic dimension of 28 mm, left atrial  dimension of 42 mm, septal thickness 8 mm.   FINAL IMPRESSION:  1. Mild left ventricular cavity enlargement with global hypokinesis,      ejection fraction of 45%.  2. Mild mitral insufficiency.  3. Aortic valve sclerosis with mild AI.  4. Normal right ventricle with mild tricuspid regurgitation. Pacing      catheter seen in the right ventricle.  5. Mild left atrial enlargement.  6. No pericardial effusion.      Noralyn Pick. Eden Emms, MD, [facility]  Electronically Signed     PCN/MEDQ  D:  10/29/2006  T:  10/29/2006  Job:  [id number]   cc:   Marcello Moores, MD

## 2011-03-05 LAB — POCT CARDIAC MARKERS
CKMB, poc: 1.2
Myoglobin, poc: 32.9
Troponin i, poc: <0.05

## 2011-03-05 LAB — CBC
HCT: 41.5
Hemoglobin: 13.8
RBC: 4.3
RDW: 13.4
WBC: 6

## 2011-03-28 LAB — CBC
Platelets: 174
RBC: 3.27 — ABNORMAL LOW
WBC: 6.5

## 2011-03-28 LAB — PREALBUMIN: Prealbumin: 23.5

## 2013-05-27 ENCOUNTER — Inpatient Hospital Stay (HOSPITAL_COMMUNITY)
Admission: EM | Admit: 2013-05-27 | Discharge: 2013-05-31 | DRG: 481 | Disposition: A | Discharge status: Skilled Nursing Facility | Payer: Medicare Other | Attending: Internal Medicine | Admitting: Internal Medicine

## 2013-05-27 ENCOUNTER — Encounter (HOSPITAL_COMMUNITY): Payer: Self-pay | Admitting: Emergency Medicine

## 2013-05-27 ENCOUNTER — Emergency Department (HOSPITAL_COMMUNITY): Payer: Medicare Other

## 2013-05-27 DIAGNOSIS — Z7901 Long term (current) use of anticoagulants: Secondary | ICD-10-CM

## 2013-05-27 DIAGNOSIS — Z681 Body mass index (BMI) 19 or less, adult: Secondary | ICD-10-CM

## 2013-05-27 DIAGNOSIS — Z7982 Long term (current) use of aspirin: Secondary | ICD-10-CM

## 2013-05-27 DIAGNOSIS — I442 Atrioventricular block, complete: Secondary | ICD-10-CM | POA: Diagnosis present

## 2013-05-27 DIAGNOSIS — Z950 Presence of cardiac pacemaker: Secondary | ICD-10-CM

## 2013-05-27 DIAGNOSIS — S72141A Displaced intertrochanteric fracture of right femur, initial encounter for closed fracture: Secondary | ICD-10-CM

## 2013-05-27 DIAGNOSIS — S72043A Displaced fracture of base of neck of unspecified femur, initial encounter for closed fracture: Secondary | ICD-10-CM

## 2013-05-27 DIAGNOSIS — N189 Chronic kidney disease, unspecified: Secondary | ICD-10-CM | POA: Diagnosis present

## 2013-05-27 DIAGNOSIS — E46 Unspecified protein-calorie malnutrition: Secondary | ICD-10-CM | POA: Diagnosis present

## 2013-05-27 DIAGNOSIS — I259 Chronic ischemic heart disease, unspecified: Secondary | ICD-10-CM | POA: Diagnosis present

## 2013-05-27 DIAGNOSIS — F17200 Nicotine dependence, unspecified, uncomplicated: Secondary | ICD-10-CM | POA: Diagnosis present

## 2013-05-27 DIAGNOSIS — W010XXA Fall on same level from slipping, tripping and stumbling without subsequent striking against object, initial encounter: Secondary | ICD-10-CM | POA: Diagnosis present

## 2013-05-27 DIAGNOSIS — D62 Acute posthemorrhagic anemia: Secondary | ICD-10-CM | POA: Diagnosis not present

## 2013-05-27 DIAGNOSIS — F329 Major depressive disorder, single episode, unspecified: Secondary | ICD-10-CM

## 2013-05-27 DIAGNOSIS — I4891 Unspecified atrial fibrillation: Secondary | ICD-10-CM | POA: Insufficient documentation

## 2013-05-27 DIAGNOSIS — D696 Thrombocytopenia, unspecified: Secondary | ICD-10-CM

## 2013-05-27 DIAGNOSIS — I700 Atherosclerosis of aorta: Secondary | ICD-10-CM | POA: Diagnosis present

## 2013-05-27 DIAGNOSIS — S72143A Displaced intertrochanteric fracture of unspecified femur, initial encounter for closed fracture: Principal | ICD-10-CM

## 2013-05-27 DIAGNOSIS — Z79899 Other long term (current) drug therapy: Secondary | ICD-10-CM

## 2013-05-27 DIAGNOSIS — Y9210 Unspecified residential institution as the place of occurrence of the external cause: Secondary | ICD-10-CM | POA: Diagnosis present

## 2013-05-27 DIAGNOSIS — M109 Gout, unspecified: Secondary | ICD-10-CM | POA: Diagnosis present

## 2013-05-27 DIAGNOSIS — I428 Other cardiomyopathies: Secondary | ICD-10-CM | POA: Diagnosis present

## 2013-05-27 DIAGNOSIS — S7223XA Displaced subtrochanteric fracture of unspecified femur, initial encounter for closed fracture: Secondary | ICD-10-CM

## 2013-05-27 DIAGNOSIS — I2589 Other forms of chronic ischemic heart disease: Secondary | ICD-10-CM | POA: Diagnosis present

## 2013-05-27 DIAGNOSIS — F321 Major depressive disorder, single episode, moderate: Secondary | ICD-10-CM | POA: Diagnosis present

## 2013-05-27 HISTORY — DX: Major depressive disorder, single episode, unspecified: F32.9

## 2013-05-27 HISTORY — DX: Unspecified place in nursing home as the place of occurrence of the external cause: Y92.129

## 2013-05-27 HISTORY — DX: Displaced fracture of base of neck of unspecified femur, initial encounter for closed fracture: S72.043A

## 2013-05-27 HISTORY — DX: Unspecified fall, initial encounter: W19.XXXA

## 2013-05-27 HISTORY — DX: Gout, unspecified: M10.9

## 2013-05-27 HISTORY — DX: Depression, unspecified: F32.A

## 2013-05-27 HISTORY — DX: Atrioventricular block, complete: I44.2

## 2013-05-27 LAB — BASIC METABOLIC PANEL
CO2: 29 mEq/L (ref 19–32)
Calcium: 9.1 mg/dL (ref 8.4–10.5)
Chloride: 103 mEq/L (ref 96–112)
Creatinine, Ser: 0.72 mg/dL (ref 0.50–1.35)
Glucose, Bld: 103 mg/dL — ABNORMAL HIGH (ref 70–99)
Sodium: 137 mEq/L (ref 135–145)

## 2013-05-27 LAB — CBC WITH DIFFERENTIAL/PLATELET
Basophils Absolute: 0 10*3/uL (ref 0.0–0.1)
Eosinophils Relative: 1 % (ref 0–5)
HCT: 38.6 % — ABNORMAL LOW (ref 39.0–52.0)
Lymphocytes Relative: 32 % (ref 12–46)
Lymphs Abs: 1.7 10*3/uL (ref 0.7–4.0)
MCV: 98.2 fL (ref 78.0–100.0)
Monocytes Absolute: 0.3 10*3/uL (ref 0.1–1.0)
Neutro Abs: 3.4 10*3/uL (ref 1.7–7.7)
RBC: 3.93 MIL/uL — ABNORMAL LOW (ref 4.22–5.81)
RDW: 13.6 % (ref 11.5–15.5)
WBC: 5.5 10*3/uL (ref 4.0–10.5)

## 2013-05-27 LAB — ABO/RH: ABO/RH(D): O POS

## 2013-05-27 LAB — URINALYSIS, ROUTINE W REFLEX MICROSCOPIC
Nitrite: NEGATIVE
Specific Gravity, Urine: 1.017 (ref 1.005–1.030)
Urobilinogen, UA: 0.2 mg/dL (ref 0.0–1.0)
pH: 5 (ref 5.0–8.0)

## 2013-05-27 LAB — PROTIME-INR
INR: 1.06 (ref 0.00–1.49)
Prothrombin Time: 13.6 seconds (ref 11.6–15.2)

## 2013-05-27 LAB — GLUCOSE, CAPILLARY

## 2013-05-27 MED ORDER — DEXTROSE-NACL 5-0.45 % IV SOLN
100.0000 mL/h | INTRAVENOUS | Status: AC
  Administered 2013-05-27 – 2013-05-28 (×2): 100 mL/h via INTRAVENOUS

## 2013-05-27 MED ORDER — VITAMIN D (ERGOCALCIFEROL) 1.25 MG (50000 UNIT) PO CAPS
50000.0000 [IU] | ORAL_CAPSULE | ORAL | Status: DC
  Filled 2013-05-27: qty 1

## 2013-05-27 MED ORDER — HYDROMORPHONE HCL PF 1 MG/ML IJ SOLN
1.0000 mg | Freq: Once | INTRAMUSCULAR | Status: AC
  Administered 2013-05-27: 1 mg via INTRAVENOUS
  Filled 2013-05-27: qty 1

## 2013-05-27 MED ORDER — HYDROMORPHONE HCL PF 1 MG/ML IJ SOLN
1.0000 mg | INTRAMUSCULAR | Status: DC | PRN
  Administered 2013-05-27 – 2013-05-29 (×8): 1 mg via INTRAVENOUS
  Filled 2013-05-27 (×8): qty 1

## 2013-05-27 MED ORDER — SERTRALINE HCL 50 MG PO TABS
50.0000 mg | ORAL_TABLET | Freq: Every day | ORAL | Status: DC
  Administered 2013-05-28 – 2013-05-31 (×4): 50 mg via ORAL
  Filled 2013-05-27 (×5): qty 1

## 2013-05-27 MED ORDER — ONDANSETRON HCL 4 MG/2ML IJ SOLN
4.0000 mg | Freq: Once | INTRAMUSCULAR | Status: AC
  Administered 2013-05-27: 4 mg via INTRAVENOUS
  Filled 2013-05-27: qty 2

## 2013-05-27 MED ORDER — MELATONIN 1 MG PO TABS: 1.0000 mg | ORAL_TABLET | Freq: Every day | ORAL | Status: DC

## 2013-05-27 MED ORDER — MIRTAZAPINE 15 MG PO TABS
15.0000 mg | ORAL_TABLET | ORAL | Status: DC
  Administered 2013-05-28 – 2013-05-31 (×4): 15 mg via ORAL
  Filled 2013-05-27 (×5): qty 1

## 2013-05-27 MED ORDER — CHLORHEXIDINE GLUCONATE 4 % EX LIQD
60.0000 mL | Freq: Once | CUTANEOUS | Status: AC
  Administered 2013-05-28: 4 via TOPICAL
  Filled 2013-05-27 (×2): qty 60

## 2013-05-27 MED ORDER — DEXTROSE 5 % IV SOLN
2.0000 g | INTRAVENOUS | Status: AC
  Administered 2013-05-28: 2 g via INTRAVENOUS
  Filled 2013-05-27: qty 20

## 2013-05-27 MED ORDER — ACETAMINOPHEN 500 MG PO TABS
1000.0000 mg | ORAL_TABLET | Freq: Once | ORAL | Status: AC
  Administered 2013-05-27: 1000 mg via ORAL
  Filled 2013-05-27: qty 2

## 2013-05-27 NOTE — ED Provider Notes (Signed)
Medical screening examination/treatment/procedure(s) were conducted as a shared visit with non-physician practitioner(s) and myself.  I personally evaluated the patient during the encounter.  EKG Interpretation    Date/Time:  Thursday May 27 2013 10:09:07 EST Ventricular Rate:  92 PR Interval:    QRS Duration: 99 QT Interval:  394 QTC Calculation: 487 R Axis:   114 Text Interpretation:  Atrial fibrillation Right axis deviation Borderline prolonged QT interval Baseline wander in lead(s) V6 Confirmed by Bebe Shaggy  MD, Lewie Deman [phone]) on 05/27/2013 10:11:59 AM              Joya Gaskins, MD 05/27/13 [phone]
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
## 2013-05-27 NOTE — ED Notes (Signed)
Internal medicine at bedside

## 2013-05-27 NOTE — Consult Note (Signed)
ORTHOPAEDIC CONSULTATION  REQUESTING PHYSICIAN: Joya Gaskins, MD  Chief Complaint: R hip fracture  HPI: [name] is a 67 y.o. male who complains of  R hip pain  Past Medical History  Diagnosis Date  . Depression   . Hypomagnesemia   . Dyspepsia    History reviewed. No pertinent past surgical history. History   Social History  . Marital Status: Divorced    Spouse Name: N/A    Number of Children: N/A  . Years of Education: N/A   Social History Main Topics  . Smoking status: None  . Smokeless tobacco: None  . Alcohol Use: None  . Drug Use: None  . Sexual Activity: None   Other Topics Concern  . None   Social History Narrative  . None   History reviewed. No pertinent family history. No Known Allergies Prior to Admission medications   Medication Sig Start Date End Date Taking? Authorizing Provider  alendronate (FOSAMAX) 35 MG tablet Take 35 mg by mouth every 7 (seven) days. Take with a full glass of water on an empty stomach.   Yes Historical Provider, MD  aspirin 81 MG chewable tablet Chew 81 mg by mouth daily.   Yes Historical Provider, MD  Calcium Carbonate-Vit D-Min (CALTRATE 600+D PLUS MINERALS) 600-800 MG-UNIT TABS Take 1 tablet by mouth daily.   Yes Historical Provider, MD  carvedilol (COREG) 12.5 MG tablet Take 12.5 mg by mouth 2 (two) times daily with a meal.   Yes Historical Provider, MD  colchicine 0.6 MG tablet Take 0.6 mg by mouth every 6 (six) hours as needed (pain).   Yes Historical Provider, MD  doxazosin (CARDURA) 4 MG tablet Take 4 mg by mouth every morning.   Yes Historical Provider, MD  finasteride (PROSCAR) 5 MG tablet Take 5 mg by mouth every morning.   Yes Historical Provider, MD  lisinopril (PRINIVIL,ZESTRIL) 5 MG tablet Take 5 mg by mouth every morning.   Yes Historical Provider, MD  magnesium oxide (MAG-OX) 400 MG tablet Take 400 mg by mouth 2 (two) times daily.   Yes Historical Provider, MD  Melatonin 1 MG TABS Take 1 mg by  mouth at bedtime.   Yes Historical Provider, MD  mirtazapine (REMERON) 15 MG tablet Take 15 mg by mouth every morning.   Yes Historical Provider, MD  pravastatin (PRAVACHOL) 20 MG tablet Take 20 mg by mouth at bedtime.   Yes Historical Provider, MD  sertraline (ZOLOFT) 50 MG tablet Take 50 mg by mouth daily.   Yes Historical Provider, MD  Vitamin D, Ergocalciferol, (DRISDOL) 50000 UNITS CAPS capsule Take 50,000 Units by mouth every 7 (seven) days.   Yes Historical Provider, MD  warfarin (COUMADIN) 1 MG tablet Take 0.5-1 mg by mouth daily. Take 0.5 mg on Monday and Friday along with 6mg  tablet to equal 6.5mg .  Take 1mg  tablet along with 6 mg tablet to equal 7mg  on all other days of the week.   Yes Historical Provider, MD  warfarin (COUMADIN) 6 MG tablet Take 6 mg by mouth daily. Take 0.5 mg on Monday and Friday along with 6mg  tablet to equal 6.5mg .  Take 1mg  tablet along with 6 mg tablet to equal 7mg  on all other days of the week.   Yes Historical Provider, MD   Dg Chest 1 View  05/27/2013   CLINICAL DATA:  Fall.  Hip pain.  Preoperative chest radiograph.  EXAM: CHEST - 1 VIEW  COMPARISON:  09/30/2007.  FINDINGS: Dual lead  right subclavian pacemaker. The cardiopericardial silhouette is within normal limits for projection. Cholecystectomy clips are present in the right upper quadrant. No airspace disease. No effusion. No displaced rib fractures in this patient with a history of fall.  IMPRESSION: No active cardiopulmonary disease.   Electronically Signed   By: Andreas Newport M.D.   On: 05/27/2013 11:29   Dg Hip Complete Left  05/27/2013   CLINICAL DATA:  Fall.  Right hip pain.  EXAM: LEFT HIP - COMPLETE 2+ VIEW  COMPARISON:  None.  FINDINGS: There is a comminuted intertrochanteric right hip fracture with sub trochanteric extension into the medial aspect of the proximal shaft. Diffuse osteopenia. The femoral head remains located. The obturator rings appear intact. Fracture fragments are minimally  displaced.  IMPRESSION: Comminuted intertrochanteric and subtrochanteric right femur fracture.   Electronically Signed   By: Andreas Newport M.D.   On: 05/27/2013 11:28    Positive ROS: All other systems have been reviewed and were otherwise negative with the exception of those mentioned in the HPI and as above.  Labs cbc  Recent Labs  05/27/13 1034  WBC 5.5  HGB 12.6*  HCT 38.6*  PLT 109*    Labs inflam No results found for this basename: ESR, CRP,  in the last 72 hours  Labs coag  Recent Labs  05/27/13 1034  INR 1.06     Recent Labs  05/27/13 1034  NA 137  K 4.0  CL 103  CO2 29  GLUCOSE 103*  BUN 11  CREATININE 0.72  CALCIUM 9.1    Physical Exam: Filed Vitals:   05/27/13 1400  BP: 131/79  Pulse: 65  Temp:   Resp: 14   General: Alert, no acute distress Cardiovascular: No pedal edema Respiratory: No cyanosis, no use of accessory musculature GI: No organomegaly, abdomen is soft and non-tender Skin: No lesions in the area of chief complaint Neurologic: Sensation intact distally Psychiatric: Patient is competent for consent with normal mood and affect Lymphatic: No axillary or cervical lymphadenopathy  MUSCULOSKELETAL:  RLE: pain with log roll. Skin benign, NVI Other extremities are atraumatic with painless ROM and NVI.  Assessment: R hip fracture  Plan: IM nail Weight Bearing Status: bedrest for now PT VTE px: SCD's and chemical post op   Margarita Rana, D, MD Cell [phone]   05/27/2013 2:34 PM

## 2013-05-27 NOTE — H&P (Signed)
Date: 05/27/2013               Patient Name:  [name] MRN: [id number]  DOB: 03-19-1946 Age / Sex: 67 y.o., male   PCP: No primary provider on file.              Medical Service: Internal Medicine Teaching Service              Attending Physician: Dr. Farley Ly, MD    First Contact: Dr. Delane Ginger Pager: [phone]  Second Contact: Dr. Zada Girt Pager: [phone]            After Hours (After 5p/  First Contact Pager: [phone]  weekends / holidays): Second Contact Pager: [phone]   Chief Complaint: Right femur fracture  History of Present Illness: Pt is a 67 yo male with a PMH significant for major depression, atrial fibrillation, complete AV block (s/p pacemaker placement), previous homelessness, and thrombocytopenia who presents from [facility] assisted living facility after a fall on 05/27/2013 at 8:30 AM. Pt is a rather poor historian and therefore the history was limited.  Patient describes an episode where he slipped on some water that was on the floor and fell. He reports falling backwards and landed on his right back side. He denies hitting his head or any other injuries.  He denies any h/o seizure disorder and was not observed to exhibit any seizure-like activity.  He denies any LOC and remembers the events leading up to the fall and thereafter.  He has no h/o frequent falls. He denies any associated dizziness, lightheadedness, CP, diaphoresis, weakness, SOB, or palpitations.  Pt was in severe pain immediately and unable to get off the floor and EMS was called.  XR revealed a comminuted intertrochanteric and subtrochanteric right femur fracture.  He is a current smoker and smokes 4-5 cigarettes/day since he was 67 yrs old.  He reports previous using alcohol but quit 30 yrs ago.  He denies any recreational drug use.   During evaluation in the ED, the patient described his pain as constant, and a 7/10 which had improved from a 10/10 with 3 doses of IV Dilaudid 1 mg.   Pt is  divorced and has no family. According to chart records he completed 2nd grade and has an appointed legal guardian.    Review of Systems: Constitutional: Denies fever, chills, diaphoresis, appetite change and fatigue.  HEENT: Denies hearing loss, ear pain, congestion, sore throat, rhinorrhea, sneezing, mouth sores, trouble swallowing, neck pain, neck stiffness and tinnitus.  Respiratory: Denies SOB, DOE, cough, chest tightness, and wheezing.  Cardiovascular: Denies chest pain, palpitations and leg swelling.  Gastrointestinal: Denies nausea, vomiting, abdominal pain, diarrhea, constipation,blood in stool and abdominal distention.  Genitourinary: Denies dysuria, urgency, frequency, hematuria, flank pain and difficulty urinating.  Skin: Denies pallor, rash and wound.  Neurological: Denies dizziness, seizures, syncope, weakness, lightheadedness, numbness and headaches.  Hematological: Denies adenopathy. Easy bruising, personal or family bleeding history  Psychiatric/Behavioral: Denies suicidal ideation, mood changes, confusion, nervousness, sleep disturbance and agitation  Medications Prior to Admission  Medication Sig Dispense Refill  . alendronate (FOSAMAX) 35 MG tablet Take 35 mg by mouth every 7 (seven) days. Take with a full glass of water on an empty stomach.      Marland Kitchen aspirin 81 MG chewable tablet Chew 81 mg by mouth daily.      . Calcium Carbonate-Vit D-Min (CALTRATE 600+D PLUS MINERALS) 600-800 MG-UNIT TABS Take 1 tablet by mouth daily.      Marland Kitchen  carvedilol (COREG) 12.5 MG tablet Take 12.5 mg by mouth 2 (two) times daily with a meal.      . colchicine 0.6 MG tablet Take 0.6 mg by mouth every 6 (six) hours as needed (pain).      Marland Kitchen doxazosin (CARDURA) 4 MG tablet Take 4 mg by mouth every morning.      . finasteride (PROSCAR) 5 MG tablet Take 5 mg by mouth every morning.      Marland Kitchen lisinopril (PRINIVIL,ZESTRIL) 5 MG tablet Take 5 mg by mouth every morning.      . magnesium oxide (MAG-OX) 400 MG tablet  Take 400 mg by mouth 2 (two) times daily.      . Melatonin 1 MG TABS Take 1 mg by mouth at bedtime.      . mirtazapine (REMERON) 15 MG tablet Take 15 mg by mouth every morning.      . pravastatin (PRAVACHOL) 20 MG tablet Take 20 mg by mouth at bedtime.      . sertraline (ZOLOFT) 50 MG tablet Take 50 mg by mouth daily.      . Vitamin D, Ergocalciferol, (DRISDOL) 50000 UNITS CAPS capsule Take 50,000 Units by mouth every 7 (seven) days.      Marland Kitchen warfarin (COUMADIN) 1 MG tablet Take 0.5-1 mg by mouth daily. Take 0.5 mg on Monday and Friday along with 6mg  tablet to equal 6.5mg .  Take 1mg  tablet along with 6 mg tablet to equal 7mg  on all other days of the week.      . warfarin (COUMADIN) 6 MG tablet Take 6 mg by mouth daily. Take 0.5 mg on Monday and Friday along with 6mg  tablet to equal 6.5mg .  Take 1mg  tablet along with 6 mg tablet to equal 7mg  on all other days of the week.            Allergies: Allergies as of 05/27/2013  . (No Known Allergies)   Past Medical History  Diagnosis Date  . Depression   . Hypomagnesemia   . Dyspepsia   . Fall at nursing home 05/27/2013    "slipped on water; said I broke my right hip" (05/27/2013)  . Migraine     "used to have them bad; haven't had one in awhile" (05/27/2013)  . Gout attack     "not too long ago; in my right foot" (05/27/2013)  . Complete heart block     reason for pacemaker/notes 05/27/2013  . Atrial fibrillation     /notes 05/27/2013   Past Surgical History  Procedure Laterality Date  . Insert / replace / remove pacemaker    . Appendectomy    . Cholecystectomy     History reviewed. No pertinent family history. History   Social History  . Marital Status: Divorced    Spouse Name: N/A    Number of Children: N/A  . Years of Education: N/A   Occupational History  . Not on file.   Social History Main Topics  . Smoking status: Current Every Day Smoker -- 0.25 packs/day for 61 years    Types: Cigarettes  . Smokeless tobacco:  Former Neurosurgeon    Types: Snuff, Chew     Comment: 05/27/2013 "haven't used chew or snuff in ~ 30 yr"  . Alcohol Use: Yes     Comment: 05/27/2013 "none in years; never had problem w/it"  . Drug Use: Not on file  . Sexual Activity: No   Other Topics Concern  . Not on file   Social History Narrative  .  No narrative on file    Physical Exam: Filed Vitals:   05/27/13 2047  BP: 142/73  Pulse: 84  Temp: 98.7 F (37.1 C)  Resp:    General: chronically ill appearing male in distress but cooperative with exam Head: atraumatic, normocephalic Nose/throat: endentulous, oropharynx clear, moist mucous membranes  Neck: supple, no carotid bruits Lungs/Chest wall: clear to auscultation bilaterally, normal work of breathing  Heart: normal rate, irregular rhythm; no M/R/G Pulses: radial and dorsalis pedis pulses are 2+ b/l Abdomen: Thin, no rebound, guarding, or rigidity; normal bowel sounds; no masses or organomegaly  Skin: warm, dry, intact, normal turgor, no rashes. Extremities: soiled feet bilaterally. No peripheral edema, clubbing, or cyanosis. Pain with minimal movement of his RLE. Peripheral pulses are present b/l in the LE.  No visible wounds. Neurologic: A&O X3, CN II - XII are grossly intact.  Unable to adequately assess strength d/t pain.   Psych: pt alert and oriented, mood and affect are normal   Lab results: Basic Metabolic Panel:  Recent Labs  40/98/11 1034  NA 137  K 4.0  CL 103  CO2 29  GLUCOSE 103*  BUN 11  CREATININE 0.72  CALCIUM 9.1   Anion Gap: 5  Recent Labs  05/27/13 1034  WBC 5.5  NEUTROABS 3.4  HGB 12.6*  HCT 38.6*  MCV 98.2  PLT 109*   Coagulation:  Recent Labs  05/27/13 1034  LABPROT 13.6  INR 1.06   Urinalysis:  Recent Labs  05/27/13 1547  COLORURINE YELLOW  LABSPEC 1.017  PHURINE 5.0  GLUCOSEU NEGATIVE  HGBUR NEGATIVE  BILIRUBINUR NEGATIVE  KETONESUR NEGATIVE  PROTEINUR NEGATIVE  UROBILINOGEN 0.2  NITRITE NEGATIVE   LEUKOCYTESUR NEGATIVE   Imaging results:  Dg Chest 1 View  05/27/2013   CLINICAL DATA:  Fall.  Hip pain.  Preoperative chest radiograph.  EXAM: CHEST - 1 VIEW  COMPARISON:  09/30/2007.  FINDINGS: Dual lead right subclavian pacemaker. The cardiopericardial silhouette is within normal limits for projection. Cholecystectomy clips are present in the right upper quadrant. No airspace disease. No effusion. No displaced rib fractures in this patient with a history of fall.  IMPRESSION: No active cardiopulmonary disease.   Electronically Signed   By: Andreas Newport M.D.   On: 05/27/2013 11:29   Dg Hip Complete Left  05/27/2013   CLINICAL DATA:  Fall.  Right hip pain.  EXAM: LEFT HIP - COMPLETE 2+ VIEW  COMPARISON:  None.  FINDINGS: There is a comminuted intertrochanteric right hip fracture with sub trochanteric extension into the medial aspect of the proximal shaft. Diffuse osteopenia. The femoral head remains located. The obturator rings appear intact. Fracture fragments are minimally displaced.  IMPRESSION: Comminuted intertrochanteric and subtrochanteric right femur fracture.   Electronically Signed   By: Andreas Newport M.D.   On: 05/27/2013 11:28    EKG: Date/Time:  Thursday May 27 2013 10:09:07 EST Ventricular Rate:  92 PR Interval:    QRS Duration: 99 QT Interval:  394 QTC Calculation: 487 R Axis:   114 Text Interpretation:  Atrial fibrillation Right axis deviation Borderline prolonged QT interval Baseline wander in lead(s) V6 Confirmed by Bebe Shaggy  MD, DONALD [phone]) on 05/27/2013 10:11:59 AM  Consults: Treatment Team:  Sheral Apley, MD C: [id number]  Assessment & Plan by Problem: Principal Problem:   Closed comminuted intertrochanteric fracture of proximal end of right femur Active Problems:   MALNUTRITION, PROTEIN-CALORIE   THROMBOCYTOPENIA NOS   DEPRESSION, MAJOR, MODERATE   PSYCHIATRIC DISORDER  DISEASE, ISCHEMIC HEART, CHRONIC NOS   CARDIOMYOPATHY   BLOCK, AV,  COMPLETE   ATHEROSCLEROSIS, AORTIC   RENAL INSUFFICIENCY, ACUTE   ATRIAL FIBRILLATION, HX OF   Fracture femur, cervicotrochanteric  67 yo male with PMH significant for major depression, atrial fibrillation, complete AV block, status post pacemaker placement, homelessness, thrombocytopenia, presents from [facility] facility after a fall.  # Fracture of the right femur: Pt with mechanical fall on 05/27/2013 at 8:30 AM. XR revealed comminuted intertrochanteric and subtrochanteric right femur fracture. Physical exam does not reveal neurovascular compromise of the right lower extremity. Of note, pt is a smoker and is on fosamax and calcium w/VitD although osteopenia/osteoporosis is not listed as part of his PMH.  No BMD/DEXA report.  Also, pt has a h/o hypomagnesium and takes magnesium supplementation.  Pt continues to complain of pain. Orthopedics (Dr. Eulah Pont) was consulted from the ED.  - Admit telemetry  - frequent neurovascular checks for compartment syndrome  - IV Dilaudid 1 mg q3 hours PRN - Orthopedics following (Dr. Eulah Pont) - NPO after midnight for surgery  - monitor CBGs q4 hrs  - IV Cefazolin per surgery   # Atrial Fibrillation: Stable. Pt reports he gets frequent INR checks from his nursing home. INR is 1.6 today. On home Coreg 12.5 mg twice a day. Echocardiogram on 10/26/2006 revealed ejection fraction of 55-65%. No other abnormalities were detected. EKG today with atrial fibrillation.  - hold coumadin pre-op. Commence when safe after surgery  - continue with Coreg for rate control  - continue telemetry  # Major depression: Stable. Continue with home Remeron and Zoloft   # Block AV complete: patient has a pacemaker in place; followed by [place] [facility] previously.  Pt states it is checked about every 3 months.   # Chronic thrombocytopenia: Platelet count 109 compared to 88 in 2009. No active bleeding.  -Monitor CBC.   Code Status: Full  Dispo: Disposition is  deferred at this time, awaiting improvement of current medical problems. Anticipated discharge in approximately 3-5 day(s).   The patient does not have a current PCP (No primary provider on file.), therefore is require OPC follow-up after discharge.   The patient does have transportation limitations that hinder transportation to clinic appointments.

## 2013-05-27 NOTE — ED Provider Notes (Signed)
CSN: [id number]     Arrival date & time 05/27/13  1027 History   First MD Initiated Contact with Patient 05/27/13 0957     Chief Complaint  Patient presents with  . Fall   (Consider location/radiation/quality/duration/timing/severity/associated sxs/prior Treatment) HPI Comments: Patient with h/o pacemaker 2/2 complete heart block, h/o permanent afib on coumadin -- presents with complaint of right hip pain, acute onset, after slipping on water this morning and falling onto his right side. Patient was transported by EMS. Pain medicine was given en route with some improvement in pain. Patient denies hitting his head or hurting his neck. He denies numbness, tingling, weakness of extremities. He denies feeling lightheaded or having chest pain prior to his fall. EMS notes external rotation and shortening of the right lower extremity. Course is constant. Movement makes pain worse. Patient does not have orthopedic physician.  The history is provided by the patient.    Past Medical History  Diagnosis Date  . Depression   . Hypomagnesemia   . Dyspepsia    History reviewed. No pertinent past surgical history. History reviewed. No pertinent family history. History  Substance Use Topics  . Smoking status: Not on file  . Smokeless tobacco: Not on file  . Alcohol Use: Not on file    Review of Systems  Constitutional: Negative for fever.  HENT: Negative for rhinorrhea and sore throat.   Eyes: Negative for redness.  Respiratory: Negative for cough and shortness of breath.   Cardiovascular: Negative for chest pain and palpitations.  Gastrointestinal: Negative for nausea, vomiting, abdominal pain and diarrhea.  Genitourinary: Negative for dysuria.  Musculoskeletal: Positive for arthralgias and myalgias.  Skin: Negative for rash.  Neurological: Negative for headaches.    Allergies  Review of patient's allergies indicates no known allergies.  Home Medications  No current outpatient  prescriptions on file. BP 133/70  Pulse 84  Temp(Src) 98.5 F (36.9 C) (Oral)  Resp 16  SpO2 98% Physical Exam  Nursing note and vitals reviewed. Constitutional: He appears well-developed and well-nourished.  HENT:  Head: Normocephalic and atraumatic.  Eyes: Conjunctivae are normal. Right eye exhibits no discharge. Left eye exhibits no discharge.  Neck: Normal range of motion. Neck supple.  Cardiovascular: Normal rate and normal heart sounds.  An irregularly irregular rhythm present.  Pulses:      Dorsalis pedis pulses are 1+ on the right side, and 1+ on the left side.       Posterior tibial pulses are 1+ on the right side, and 1+ on the left side.  Diminished pedal pulses bilaterally with cap refill = 2s. No cyanosis of feet.   Pulmonary/Chest: Effort normal and breath sounds normal.  Abdominal: Soft. There is no tenderness.  Musculoskeletal:       Right hip: He exhibits decreased range of motion, decreased strength, tenderness and bony tenderness.       Right knee: Normal.       Right ankle: Normal.       Lumbar back: Normal.       Legs: Neurological: He is alert.  Skin: Skin is warm and dry.  Psychiatric: He has a normal mood and affect.    ED Course  Procedures (including critical care time) Labs Review Labs Reviewed  CBC WITH DIFFERENTIAL - Abnormal; Notable for the following:    RBC 3.93 (*)    Hemoglobin 12.6 (*)    HCT 38.6 (*)    Platelets 109 (*)    All other components within normal limits  BASIC METABOLIC PANEL - Abnormal; Notable for the following:    Glucose, Bld 103 (*)    All other components within normal limits  PROTIME-INR  URINALYSIS, ROUTINE W REFLEX MICROSCOPIC   Imaging Review Dg Chest 1 View  05/27/2013   CLINICAL DATA:  Fall.  Hip pain.  Preoperative chest radiograph.  EXAM: CHEST - 1 VIEW  COMPARISON:  09/30/2007.  FINDINGS: Dual lead right subclavian pacemaker. The cardiopericardial silhouette is within normal limits for projection.  Cholecystectomy clips are present in the right upper quadrant. No airspace disease. No effusion. No displaced rib fractures in this patient with a history of fall.  IMPRESSION: No active cardiopulmonary disease.   Electronically Signed   By: Andreas Newport M.D.   On: 05/27/2013 11:29   Dg Hip Complete Left  05/27/2013   CLINICAL DATA:  Fall.  Right hip pain.  EXAM: LEFT HIP - COMPLETE 2+ VIEW  COMPARISON:  None.  FINDINGS: There is a comminuted intertrochanteric right hip fracture with sub trochanteric extension into the medial aspect of the proximal shaft. Diffuse osteopenia. The femoral head remains located. The obturator rings appear intact. Fracture fragments are minimally displaced.  IMPRESSION: Comminuted intertrochanteric and subtrochanteric right femur fracture.   Electronically Signed   By: Andreas Newport M.D.   On: 05/27/2013 11:28    EKG Interpretation    Date/Time:  Thursday May 27 2013 10:09:07 EST Ventricular Rate:  92 PR Interval:    QRS Duration: 99 QT Interval:  394 QTC Calculation: 487 R Axis:   114 Text Interpretation:  Atrial fibrillation Right axis deviation Borderline prolonged QT interval Baseline wander in lead(s) V6 Confirmed by Bebe Shaggy  MD, DONALD ([id number]) on 05/27/2013 10:11:59 AM           10:20 AM Patient seen and examined. Work-up initiated. Medications ordered. Pt discussed with and seen by Dr. Bebe Shaggy.   Vital signs reviewed and are as follows: Filed Vitals:   05/27/13 1007  BP: 133/70  Pulse: 84  Temp: 98.5 F (36.9 C)  Resp: 16   11:25 AM Pt informed of hip fracture and need for admission.   11:54 AM Spoke with IMTS who will see this unassigned patient. Pending ortho callback.   12:54 PM Dr. Eulah Pont to see. Will consider repair tomorrow.   Spoke with Dr. Eulah Pont who states he will see patient tonight or tomorrow morning.    MDM   1. Intertrochanteric fracture of right hip, closed, initial encounter    Admit for hip fracture.      Renne Crigler, PA-C 05/27/13 [phone]

## 2013-05-27 NOTE — ED Provider Notes (Signed)
Patient seen/examined in the Emergency Department in conjunction with Midlevel Provider [place] Patient reports right hip pain s/p fall Exam : tenderness with ROM of right hip Plan: suspect right hip fracture.    Joya Gaskins, MD 05/27/13 [phone]

## 2013-05-27 NOTE — ED Notes (Signed)
Report received, assumed care.  

## 2013-05-28 ENCOUNTER — Inpatient Hospital Stay (HOSPITAL_COMMUNITY): Payer: Medicare Other

## 2013-05-28 ENCOUNTER — Encounter (HOSPITAL_COMMUNITY)
Admission: EM | Disposition: A | Discharge status: Skilled Nursing Facility | Payer: Self-pay | Source: Home / Self Care | Attending: Internal Medicine

## 2013-05-28 ENCOUNTER — Encounter (HOSPITAL_COMMUNITY): Payer: Medicare Other | Admitting: Certified Registered"

## 2013-05-28 ENCOUNTER — Encounter (HOSPITAL_COMMUNITY): Payer: Self-pay | Admitting: Certified Registered"

## 2013-05-28 ENCOUNTER — Inpatient Hospital Stay (HOSPITAL_COMMUNITY): Payer: Medicare Other | Admitting: Certified Registered"

## 2013-05-28 HISTORY — PX: OPEN REDUCTION OF HIP: SHX5993

## 2013-05-28 LAB — CBC
HCT: 34.4 % — ABNORMAL LOW (ref 39.0–52.0)
Hemoglobin: 11 g/dL — ABNORMAL LOW (ref 13.0–17.0)
MCV: 97.7 fL (ref 78.0–100.0)
RBC: 3.52 MIL/uL — ABNORMAL LOW (ref 4.22–5.81)
WBC: 6.3 10*3/uL (ref 4.0–10.5)

## 2013-05-28 LAB — COMPREHENSIVE METABOLIC PANEL
BUN: 10 mg/dL (ref 6–23)
CO2: 27 mEq/L (ref 19–32)
Chloride: 100 mEq/L (ref 96–112)
Creatinine, Ser: 0.64 mg/dL (ref 0.50–1.35)
GFR calc Af Amer: >90 mL/min (ref >90)
GFR calc non Af Amer: >90 mL/min (ref >90)
Glucose, Bld: 132 mg/dL — ABNORMAL HIGH (ref 70–99)
Total Bilirubin: 0.9 mg/dL (ref 0.3–1.2)

## 2013-05-28 LAB — PROTIME-INR
INR: 1.09 (ref 0.00–1.49)
Prothrombin Time: 13.9 seconds (ref 11.6–15.2)

## 2013-05-28 LAB — GLUCOSE, CAPILLARY
Glucose-Capillary: 122 mg/dL — ABNORMAL HIGH (ref 70–99)
Glucose-Capillary: 134 mg/dL — ABNORMAL HIGH (ref 70–99)
Glucose-Capillary: 137 mg/dL — ABNORMAL HIGH (ref 70–99)

## 2013-05-28 LAB — SURGICAL PCR SCREEN: Staphylococcus aureus: NEGATIVE

## 2013-05-28 LAB — HIV ANTIBODY (ROUTINE TESTING W REFLEX): HIV: NONREACTIVE

## 2013-05-28 SURGERY — OPEN REDUCTION OF HIP
Anesthesia: General | Site: Hip | Laterality: Right

## 2013-05-28 MED ORDER — WARFARIN SODIUM 7.5 MG PO TABS
7.5000 mg | ORAL_TABLET | Freq: Once | ORAL | Status: AC
  Administered 2013-05-28: 7.5 mg via ORAL
  Filled 2013-05-28: qty 1

## 2013-05-28 MED ORDER — ROCURONIUM BROMIDE 100 MG/10ML IV SOLN
INTRAVENOUS | Status: DC | PRN
  Administered 2013-05-28: 20 mg via INTRAVENOUS

## 2013-05-28 MED ORDER — FENTANYL CITRATE 0.05 MG/ML IJ SOLN
INTRAMUSCULAR | Status: DC | PRN
  Administered 2013-05-28 (×3): 50 ug via INTRAVENOUS

## 2013-05-28 MED ORDER — NEOSTIGMINE METHYLSULFATE 1 MG/ML IJ SOLN
INTRAMUSCULAR | Status: DC | PRN
  Administered 2013-05-28: 3 mg via INTRAVENOUS

## 2013-05-28 MED ORDER — PHENOL 1.4 % MT LIQD
1.0000 | OROMUCOSAL | Status: DC | PRN
  Filled 2013-05-28: qty 177

## 2013-05-28 MED ORDER — ONDANSETRON HCL 4 MG PO TABS: 4.0000 mg | ORAL_TABLET | Freq: Four times a day (QID) | ORAL | Status: DC | PRN

## 2013-05-28 MED ORDER — GLYCOPYRROLATE 0.2 MG/ML IJ SOLN
INTRAMUSCULAR | Status: DC | PRN
  Administered 2013-05-28: 0.2 mg via INTRAVENOUS

## 2013-05-28 MED ORDER — HYDROMORPHONE HCL PF 1 MG/ML IJ SOLN
0.2500 mg | INTRAMUSCULAR | Status: DC | PRN
  Administered 2013-05-28 (×2): 0.5 mg via INTRAVENOUS

## 2013-05-28 MED ORDER — WARFARIN - PHARMACIST DOSING INPATIENT: Freq: Every day | Status: DC

## 2013-05-28 MED ORDER — CEFAZOLIN SODIUM-DEXTROSE 2-3 GM-% IV SOLR
2.0000 g | Freq: Four times a day (QID) | INTRAVENOUS | Status: AC
  Administered 2013-05-28 (×2): 2 g via INTRAVENOUS
  Filled 2013-05-28 (×3): qty 50

## 2013-05-28 MED ORDER — CARVEDILOL 12.5 MG PO TABS
12.5000 mg | ORAL_TABLET | Freq: Two times a day (BID) | ORAL | Status: DC
  Administered 2013-05-28 – 2013-05-31 (×8): 12.5 mg via ORAL
  Filled 2013-05-28 (×9): qty 1

## 2013-05-28 MED ORDER — ACETAMINOPHEN 650 MG RE SUPP: 650.0000 mg | Freq: Four times a day (QID) | RECTAL | Status: DC | PRN

## 2013-05-28 MED ORDER — 0.9 % SODIUM CHLORIDE (POUR BTL) OPTIME
TOPICAL | Status: DC | PRN
  Administered 2013-05-28: 1000 mL

## 2013-05-28 MED ORDER — ACETAMINOPHEN 325 MG PO TABS: 650.0000 mg | ORAL_TABLET | Freq: Four times a day (QID) | ORAL | Status: DC | PRN

## 2013-05-28 MED ORDER — METOCLOPRAMIDE HCL 5 MG PO TABS
5.0000 mg | ORAL_TABLET | Freq: Three times a day (TID) | ORAL | Status: DC | PRN
  Filled 2013-05-28: qty 2

## 2013-05-28 MED ORDER — ONDANSETRON HCL 4 MG/2ML IJ SOLN: 4.0000 mg | Freq: Four times a day (QID) | INTRAMUSCULAR | Status: DC | PRN

## 2013-05-28 MED ORDER — HYDROCODONE-ACETAMINOPHEN 5-325 MG PO TABS
1.0000 | ORAL_TABLET | Freq: Four times a day (QID) | ORAL | Status: DC | PRN
  Administered 2013-05-28 – 2013-05-29 (×4): 2 via ORAL
  Filled 2013-05-28 (×4): qty 2

## 2013-05-28 MED ORDER — LACTATED RINGERS IV SOLN
INTRAVENOUS | Status: DC | PRN
  Administered 2013-05-28: 07:00:00 via INTRAVENOUS

## 2013-05-28 MED ORDER — METOCLOPRAMIDE HCL 5 MG/ML IJ SOLN
5.0000 mg | Freq: Three times a day (TID) | INTRAMUSCULAR | Status: DC | PRN
  Filled 2013-05-28: qty 2

## 2013-05-28 MED ORDER — MENTHOL 3 MG MT LOZG
1.0000 | LOZENGE | OROMUCOSAL | Status: DC | PRN
  Filled 2013-05-28: qty 9

## 2013-05-28 MED ORDER — ALBUMIN HUMAN 5 % IV SOLN
INTRAVENOUS | Status: DC | PRN
  Administered 2013-05-28: 08:00:00 via INTRAVENOUS

## 2013-05-28 MED ORDER — LIDOCAINE HCL (CARDIAC) 20 MG/ML IV SOLN
INTRAVENOUS | Status: DC | PRN
  Administered 2013-05-28: 100 mg via INTRAVENOUS

## 2013-05-28 MED ORDER — ENOXAPARIN SODIUM 40 MG/0.4ML ~~LOC~~ SOLN
40.0000 mg | SUBCUTANEOUS | Status: DC
  Administered 2013-05-29 – 2013-05-30 (×2): 40 mg via SUBCUTANEOUS
  Filled 2013-05-28 (×3): qty 0.4

## 2013-05-28 MED ORDER — PHENYLEPHRINE HCL 10 MG/ML IJ SOLN
INTRAMUSCULAR | Status: DC | PRN
  Administered 2013-05-28 (×6): 80 ug via INTRAVENOUS

## 2013-05-28 MED ORDER — ONDANSETRON HCL 4 MG/2ML IJ SOLN
INTRAMUSCULAR | Status: DC | PRN
  Administered 2013-05-28: 4 mg via INTRAVENOUS

## 2013-05-28 MED ORDER — MORPHINE SULFATE 2 MG/ML IJ SOLN: 0.5000 mg | INTRAMUSCULAR | Status: DC | PRN

## 2013-05-28 MED ORDER — ARTIFICIAL TEARS OP OINT
TOPICAL_OINTMENT | OPHTHALMIC | Status: DC | PRN
  Administered 2013-05-28: 1 via OPHTHALMIC

## 2013-05-28 MED ORDER — PROPOFOL 10 MG/ML IV BOLUS
INTRAVENOUS | Status: DC | PRN
  Administered 2013-05-28: 110 mg via INTRAVENOUS

## 2013-05-28 MED ORDER — HYDROMORPHONE HCL PF 1 MG/ML IJ SOLN
INTRAMUSCULAR | Status: AC
  Filled 2013-05-28: qty 1

## 2013-05-28 MED ORDER — DOCUSATE SODIUM 100 MG PO CAPS: 100.0000 mg | ORAL_CAPSULE | Freq: Two times a day (BID) | ORAL | Status: DC

## 2013-05-28 MED ORDER — DEXTROSE-NACL 5-0.45 % IV SOLN
INTRAVENOUS | Status: AC
  Administered 2013-05-28 – 2013-05-29 (×2): via INTRAVENOUS

## 2013-05-28 MED ORDER — HYDROCODONE-ACETAMINOPHEN 5-325 MG PO TABS: 2.0000 | ORAL_TABLET | ORAL | Status: DC | PRN

## 2013-05-28 MED ORDER — EPHEDRINE SULFATE 50 MG/ML IJ SOLN
INTRAMUSCULAR | Status: DC | PRN
  Administered 2013-05-28: 5 mg via INTRAVENOUS

## 2013-05-28 SURGICAL SUPPLY — 38 items
BIT DRILL AO GAMMA 4.2X180 (BIT) ×1 IMPLANT
CLOTH BEACON ORANGE TIMEOUT ST (SAFETY) ×2 IMPLANT
COVER MAYO STAND STRL (DRAPES) ×1 IMPLANT
COVER SURGICAL LIGHT HANDLE (MISCELLANEOUS) ×2 IMPLANT
DRAPE STERI IOBAN 125X83 (DRAPES) ×2 IMPLANT
DRSG ADAPTIC 3X8 NADH LF (GAUZE/BANDAGES/DRESSINGS) ×2 IMPLANT
DRSG TEGADERM 2-3/8X2-3/4 SM (GAUZE/BANDAGES/DRESSINGS) ×2 IMPLANT
DRSG TEGADERM 4X4.75 (GAUZE/BANDAGES/DRESSINGS) ×3 IMPLANT
DURAPREP 26ML APPLICATOR (WOUND CARE) ×2 IMPLANT
ELECT REM PT RETURN 9FT ADLT (ELECTROSURGICAL) ×2
ELECTRODE REM PT RTRN 9FT ADLT (ELECTROSURGICAL) ×1 IMPLANT
GAUZE XEROFORM 5X9 LF (GAUZE/BANDAGES/DRESSINGS) ×1 IMPLANT
GLOVE BIO SURGEON STRL SZ7.5 (GLOVE) ×2 IMPLANT
GLOVE BIOGEL PI IND STRL 8 (GLOVE) ×1 IMPLANT
GLOVE BIOGEL PI INDICATOR 8 (GLOVE) ×2
GOWN STRL NON-REIN LRG LVL3 (GOWN DISPOSABLE) ×2 IMPLANT
GUIDEROD T2 3X1000 (ROD) ×1 IMPLANT
K-WIRE  3.2X450M STR (WIRE) ×2
K-WIRE 3.2X450M STR (WIRE) ×2
KIT ROOM TURNOVER OR (KITS) ×2 IMPLANT
KWIRE 3.2X450M STR (WIRE) IMPLANT
MANIFOLD NEPTUNE II (INSTRUMENTS) ×1 IMPLANT
NAIL LONG GAMMA3 10X420X125 (Nail) ×1 IMPLANT
NS IRRIG 1000ML POUR BTL (IV SOLUTION) ×2 IMPLANT
PACK GENERAL/GYN (CUSTOM PROCEDURE TRAY) ×2 IMPLANT
PAD ARMBOARD 7.5X6 YLW CONV (MISCELLANEOUS) ×4 IMPLANT
SCREW LAG GAMMA 3 TI 10.5X100M (Screw) ×1 IMPLANT
SCREW LOCKING T2 F/T  5MMX40MM (Screw) ×1 IMPLANT
SCREW LOCKING T2 F/T 5MMX40MM (Screw) IMPLANT
SPONGE GAUZE 4X4 12PLY (GAUZE/BANDAGES/DRESSINGS) ×2 IMPLANT
STAPLER VISISTAT 35W (STAPLE) ×2 IMPLANT
SUT MNCRL AB 4-0 PS2 18 (SUTURE) IMPLANT
SUT MON AB 2-0 CT1 27 (SUTURE) ×2 IMPLANT
SUT VIC AB 0 CT1 27 (SUTURE) ×2
SUT VIC AB 0 CT1 27XBRD ANBCTR (SUTURE) ×1 IMPLANT
TOWEL OR 17X24 6PK STRL BLUE (TOWEL DISPOSABLE) ×2 IMPLANT
TOWEL OR 17X26 10 PK STRL BLUE (TOWEL DISPOSABLE) ×2 IMPLANT
WATER STERILE IRR 1000ML POUR (IV SOLUTION) IMPLANT

## 2013-05-28 NOTE — Progress Notes (Signed)
Pt returned from OR. Pedal pulses auscultated with doppler, present and marked on R foot.  L pedal pulse also palpable, 1+ weak.  Bilateral lower extremities cool to touch. No discoloration present. Will continue to monitor. [name]

## 2013-05-28 NOTE — Anesthesia Postprocedure Evaluation (Signed)
  Anesthesia Post-op Note  Patient: [name]  Procedure(s) Performed: Procedure(s): OPEN REDUCTION OF HIP (Right)  Patient Location: PACU  Anesthesia Type:General  Level of Consciousness: awake  Airway and Oxygen Therapy: Patient Spontanous Breathing  Post-op Pain: mild  Post-op Assessment: Post-op Vital signs reviewed  Post-op Vital Signs: Reviewed  Complications: No apparent anesthesia complications

## 2013-05-28 NOTE — Anesthesia Procedure Notes (Signed)
Procedure Name: Intubation Date/Time: 05/28/2013 7:21 AM Performed by: Lanell Matar Pre-anesthesia Checklist: Patient identified, Timeout performed, Emergency Drugs available, Suction available and Patient being monitored Patient Re-evaluated:Patient Re-evaluated prior to inductionOxygen Delivery Method: Circle system utilized Preoxygenation: Pre-oxygenation with 100% oxygen Intubation Type: IV induction Ventilation: Mask ventilation without difficulty and Oral airway inserted - appropriate to patient size Laryngoscope Size: Hyacinth Meeker and 2 Grade View: Grade I Tube type: Oral Tube size: 7.5 mm Number of attempts: 1 Airway Equipment and Method: Stylet Placement Confirmation: ETT inserted through vocal cords under direct vision,  positive ETCO2,  CO2 detector and breath sounds checked- equal and bilateral Secured at: 22 cm Tube secured with: Tape Dental Injury: Teeth and Oropharynx as per pre-operative assessment

## 2013-05-28 NOTE — Progress Notes (Signed)
ANTICOAGULATION CONSULT NOTE - Initial Consult  Pharmacy Consult for warfarin Indication: atrial fibrillation and VTE prophylaxis  No Known Allergies  Patient Measurements: Height: 5\' 9"  (175.3 cm) IBW/kg (Calculated) : 70.7 Heparin Dosing Weight:   Vital Signs: Temp: 98.7 F (37.1 C) (12/19 0953) BP: 153/64 mmHg (12/19 1012) Pulse Rate: 76 (12/19 0945)  Labs:  Recent Labs  05/27/13 1034 05/28/13 0704  HGB 12.6* 11.0*  HCT 38.6* 34.4*  PLT 109* 109*  LABPROT 13.6 13.9  INR 1.06 1.09  CREATININE 0.72 0.64    The CrCl is unknown because both a height and weight (above a minimum accepted value) are required for this calculation.   Medical History: Past Medical History  Diagnosis Date  . Depression   . Hypomagnesemia   . Dyspepsia   . Fall at nursing home 05/27/2013    "slipped on water; said I broke my right hip" (05/27/2013)  . Migraine     "used to have them bad; haven't had one in awhile" (05/27/2013)  . Gout attack     "not too long ago; in my right foot" (05/27/2013)  . Complete heart block     reason for pacemaker/notes 05/27/2013  . Atrial fibrillation     /notes 05/27/2013    Medications:  Prescriptions prior to admission  Medication Sig Dispense Refill  . alendronate (FOSAMAX) 35 MG tablet Take 35 mg by mouth every 7 (seven) days. Take with a full glass of water on an empty stomach.      Marland Kitchen aspirin 81 MG chewable tablet Chew 81 mg by mouth daily.      . Calcium Carbonate-Vit D-Min (CALTRATE 600+D PLUS MINERALS) 600-800 MG-UNIT TABS Take 1 tablet by mouth daily.      . carvedilol (COREG) 12.5 MG tablet Take 12.5 mg by mouth 2 (two) times daily with a meal.      . colchicine 0.6 MG tablet Take 0.6 mg by mouth every 6 (six) hours as needed (pain).      Marland Kitchen doxazosin (CARDURA) 4 MG tablet Take 4 mg by mouth every morning.      . finasteride (PROSCAR) 5 MG tablet Take 5 mg by mouth every morning.      Marland Kitchen lisinopril (PRINIVIL,ZESTRIL) 5 MG tablet Take 5 mg  by mouth every morning.      . magnesium oxide (MAG-OX) 400 MG tablet Take 400 mg by mouth 2 (two) times daily.      . Melatonin 1 MG TABS Take 1 mg by mouth at bedtime.      . mirtazapine (REMERON) 15 MG tablet Take 15 mg by mouth every morning.      . pravastatin (PRAVACHOL) 20 MG tablet Take 20 mg by mouth at bedtime.      . sertraline (ZOLOFT) 50 MG tablet Take 50 mg by mouth daily.      . Vitamin D, Ergocalciferol, (DRISDOL) 50000 UNITS CAPS capsule Take 50,000 Units by mouth every 7 (seven) days.      Marland Kitchen warfarin (COUMADIN) 1 MG tablet Take 0.5-1 mg by mouth daily. Take 0.5 mg on Monday and Friday along with 6mg  tablet to equal 6.5mg .  Take 1mg  tablet along with 6 mg tablet to equal 7mg  on all other days of the week.      . warfarin (COUMADIN) 6 MG tablet Take 6 mg by mouth daily. Take 0.5 mg on Monday and Friday along with 6mg  tablet to equal 6.5mg .  Take 1mg  tablet along with 6 mg tablet to equal  7mg  on all other days of the week.        Assessment: 67 yom on chronic coumadin for afib presented to the hospital after a fall. Now s/p ORIF of R hip. To restart coumadin tonight. Lovenox prophylaxis to start tomorrow AM. INR is low at 1.09. H/H low at 11/34.4, and plts are chronically low at 109. No overt bleeding noted.   Goal of Therapy:  INR 2-3   Plan:  1. Warfarin 7.5mg  PO x 1 tonight 2. Daily INR  Analayah Brooke, Drake Leach 05/28/2013,10:35 AM

## 2013-05-28 NOTE — Progress Notes (Signed)
Subjective:  Pt is s/p open reduction of right hip.  He c/o right extremity pain.  He denies any CP, SOB, N/V, or abdominal pain.    Objective:  Vital signs in last 24 hours: Filed Vitals:   05/28/13 0945 05/28/13 0953 05/28/13 1012 05/28/13 1226  BP:   153/64 149/69  Pulse: 76   94  Temp:  98.7 F (37.1 C)  97.2 F (36.2 C)  TempSrc:    Oral  Resp: 19   20  Height:      SpO2: 95%   96%   Weight change:   Intake/Output Summary (Last 24 hours) at 05/28/13 1601 Last data filed at 05/28/13 0900  Gross per 24 hour  Intake 1778.33 ml  Output    200 ml  Net 1578.33 ml    Physical Exam: Constitutional: Vital signs reviewed.  Patient is chronically ill appearing lying in bed in NAD.     Head: Normocephalic and atraumatic Eyes: PERRL, EOMI, conjunctivae normal, no scleral icterus  Neck: Supple, Trachea midline Cardiovascular: RRR, S1, S2 present, no MRG, DP 2+ b/l Pulmonary/Chest: normal respiratory effort, CTAB, no wheezes, rales, or rhonchi Abdominal: Soft. +BS, Non-tender, non-distended Musculoskeletal: R hip s/p open reduction, L hip wnl Neurological: A&O x3, cranial nerve II-XII are grossly intact, moving all extremities  Skin: Cool extremities (b/l below both knees), dry and intact. No rash. RN obtained DP pulses with doppler.   Lab Results:  BMP:  Recent Labs Lab 05/27/13 1034 05/27/13 2040 05/28/13 0704  NA 137  --  135  K 4.0  --  3.7  CL 103  --  100  CO2 29  --  27  GLUCOSE 103*  --  132*  BUN 11  --  10  CREATININE 0.72  --  0.64  CALCIUM 9.1  --  9.0  MG  --   --  1.7  PHOS  --  2.9  --     CBC:  Recent Labs Lab 05/27/13 1034 05/28/13 0704  WBC 5.5 6.3  NEUTROABS 3.4  --   HGB 12.6* 11.0*  HCT 38.6* 34.4*  MCV 98.2 97.7  PLT 109* 109*    Coagulation:  Recent Labs Lab 05/27/13 1034 05/28/13 0704  LABPROT 13.6 13.9  INR 1.06 1.09    CBG:            Recent Labs Lab 05/27/13 2135 05/28/13 0010 05/28/13 0639 05/28/13 0913  05/28/13 1139  GLUCAP 137* 137* 134* 122* 126*          LFTs:  Recent Labs Lab 05/28/13 0704  AST 31  ALT 21  ALKPHOS 121*  BILITOT 0.9  PROT 7.1  ALBUMIN 3.6   EKG:  Date/Time:  Thursday May 27 2013 10:09:07 EST Ventricular Rate:  92 PR Interval:    QRS Duration: 99 QT Interval:  394 QTC Calculation: 487 R Axis:   114 Text Interpretation:  Atrial fibrillation Right axis deviation Borderline prolonged QT interval Baseline wander in lead(s) V6 Confirmed by Bebe Shaggy  MD, DONALD [phone]) on 05/27/2013 10:11:59 AM   Urinalysis:  Recent Labs Lab 05/27/13 1547  COLORURINE YELLOW  LABSPEC 1.017  PHURINE 5.0  GLUCOSEU NEGATIVE  HGBUR NEGATIVE  BILIRUBINUR NEGATIVE  KETONESUR NEGATIVE  PROTEINUR NEGATIVE  UROBILINOGEN 0.2  NITRITE NEGATIVE  LEUKOCYTESUR NEGATIVE    Micro Results: Recent Results (from the past 240 hour(s))  SURGICAL PCR SCREEN     Status: None   Collection Time    05/28/13  2:44 AM  Result Value Range Status   MRSA, PCR NEGATIVE  NEGATIVE Final   Staphylococcus aureus NEGATIVE  NEGATIVE Final   Comment:            The Xpert SA Assay (FDA     approved for NASAL specimens     in patients over 48 years of age),     is one component of     a comprehensive surveillance     program.  Test performance has     been validated by [employer] for patients greater     than or equal to 82 year old.     It is not intended     to diagnose infection nor to     guide or monitor treatment.    Blood Culture: No results found for this basename: sdes,  specrequest,  cult,  reptstatus    Studies/Results: Dg Chest 1 View  05/27/2013   CLINICAL DATA:  Fall.  Hip pain.  Preoperative chest radiograph.  EXAM: CHEST - 1 VIEW  COMPARISON:  09/30/2007.  FINDINGS: Dual lead right subclavian pacemaker. The cardiopericardial silhouette is within normal limits for projection. Cholecystectomy clips are present in the right upper quadrant. No airspace  disease. No effusion. No displaced rib fractures in this patient with a history of fall.  IMPRESSION: No active cardiopulmonary disease.   Electronically Signed   By: Andreas Newport M.D.   On: 05/27/2013 11:29   Dg Hip Complete Left  05/27/2013   CLINICAL DATA:  Fall.  Right hip pain.  EXAM: LEFT HIP - COMPLETE 2+ VIEW  COMPARISON:  None.  FINDINGS: There is a comminuted intertrochanteric right hip fracture with sub trochanteric extension into the medial aspect of the proximal shaft. Diffuse osteopenia. The femoral head remains located. The obturator rings appear intact. Fracture fragments are minimally displaced.  IMPRESSION: Comminuted intertrochanteric and subtrochanteric right femur fracture.   Electronically Signed   By: Andreas Newport M.D.   On: 05/27/2013 11:28   Dg Hip Operative Right  05/28/2013   CLINICAL DATA:  Right femoral intertrochanteric fracture.  EXAM: DG OPERATIVE RIGHT HIP  TECHNIQUE: Spot fluoroscopic images of the right hip were submitted.  COMPARISON:  05/27/2013  FINDINGS: Again noted is a displaced right femoral intertrochanteric fracture. Intramedullary nail placement within the right femur. There is a hip screw extending through the right femoral head and neck. The intramedullary nail extends into the distal femur with a distal interlocking screw.  IMPRESSION: Internal fixation of the displaced right femoral intertrochanteric fracture.   Electronically Signed   By: Richarda Overlie M.D.   On: 05/28/2013 08:56   Dg Pelvis Portable  05/28/2013   CLINICAL DATA:  Right trochanteric fracture repair  EXAM: PORTABLE PELVIS 1-2 VIEWS  COMPARISON:  None.  FINDINGS: There is a minimally displaced right intertrochanteric fracture transfixed with a intra medullary nail and a cannulated femoral neck screw without hardware failure or complication. The distal aspect of the intra medullary nail is excluded from the field of view. There is no hip dislocation. There are postsurgical changes  overlying the right hip.  The left hip is unremarkable.  IMPRESSION: ORIF right intertrochanteric fracture.   Electronically Signed   By: Elige Ko   On: 05/28/2013 09:10   Dg Femur Right Port  05/28/2013   CLINICAL DATA:  Right intertrochanteric fracture  EXAM: PORTABLE RIGHT FEMUR - 2 VIEW  COMPARISON:  None.  FINDINGS: Minimally displaced right intertrochanteric fracture transfixed with a  right intra medullary nail and interlocking cannulated femoral neck screw. There is no dislocation. Postsurgical changes are noted over the right hip and distal right femur. There is no hardware failure or complication.  IMPRESSION: ORIF right intertrochanteric fracture.   Electronically Signed   By: Elige Ko   On: 05/28/2013 09:13    Medications:  Scheduled Meds: . carvedilol  12.5 mg Oral BID WC  .  ceFAZolin (ANCEF) IV  2 g Intravenous Q6H  . [START ON 05/29/2013] enoxaparin (LOVENOX) injection  40 mg Subcutaneous Q24H  . HYDROmorphone      . mirtazapine  15 mg Oral BH-q7a  . sertraline  50 mg Oral Daily  . Vitamin D (Ergocalciferol)  50,000 Units Oral Q7 days  . warfarin  7.5 mg Oral ONCE-1800  . Warfarin - Pharmacist Dosing Inpatient   Does not apply q1800   Continuous Infusions: . dextrose 5 % and 0.45% NaCl 75 mL/hr at 05/28/13 1015   PRN Meds:.acetaminophen, acetaminophen, HYDROcodone-acetaminophen, HYDROmorphone (DILAUDID) injection, menthol-cetylpyridinium, metoCLOPramide (REGLAN) injection, metoCLOPramide, morphine injection, ondansetron (ZOFRAN) IV, ondansetron, phenol  Antibiotics: Anti-infectives   Start     Dose/Rate Route Frequency Ordered Stop   05/28/13 1400  ceFAZolin (ANCEF) IVPB 2 g/50 mL premix     2 g 100 mL/hr over 30 Minutes Intravenous Every 6 hours 05/28/13 1013 05/29/13 0159   05/28/13 0600  [MAR Hold]  ceFAZolin (ANCEF) 2 g in dextrose 5 % 50 mL IVPB     (On MAR Hold since 05/28/13 0629)   2 g 140 mL/hr over 30 Minutes Intravenous On call to O.R. 05/27/13 1439  05/28/13 0735     Antibiotics Given (last 72 hours)   Date/Time Action Medication Dose   05/28/13 0735 Given   [MAR Hold] ceFAZolin (ANCEF) 2 g in dextrose 5 % 50 mL IVPB (On MAR Hold since 05/28/13 0629) 2 g     Day of Hospitalization:  1 Consults: Treatment Team:  Sheral Apley, MD  Assessment/Plan: Principal Problem:   Closed comminuted intertrochanteric fracture of proximal end of right femur Active Problems:   MALNUTRITION, PROTEIN-CALORIE   THROMBOCYTOPENIA NOS   DEPRESSION, MAJOR, MODERATE   PSYCHIATRIC DISORDER   DISEASE, ISCHEMIC HEART, CHRONIC NOS   CARDIOMYOPATHY   BLOCK, AV, COMPLETE   ATHEROSCLEROSIS, AORTIC   RENAL INSUFFICIENCY, ACUTE   Atrial fibrillation   Fracture femur, cervicotrochanteric  # Fracture of the right femur: s/p open reduction of the hip.  Pt with mechanical fall on 05/27/2013 at 8:30 AM. XR revealed comminuted intertrochanteric and subtrochanteric right femur fracture. Physical exam does not reveal neurovascular compromise of the right lower extremity. Of note, pt is a smoker and is on fosamax and calcium w/VitD although osteopenia/osteoporosis is not listed as part of his PMH. No BMD/DEXA report. Also, pt has a h/o hypomagnesium and takes magnesium supplementation. Pt continues to complain of pain. Orthopedics (Dr. Eulah Pont) was consulted from the ED and pt had open reduction of the right hip this AM.  He was admitted to telemetry.  He c/o pain this AM but denies any other CP, SOB, fever/chills, or N/V.   - frequent neurovascular checks for compartment syndrome  - IV Dilaudid 1 mg q3 hours PRN  - Norco 1-2 tabs q6 hrs PRN - Orthopedics following (Dr. Eulah Pont)  - IV Cefazolin per surgery   # Atrial Fibrillation: Stable. Pt reports he gets frequent INR checks from his nursing home. INR is 1.6 today. On home Coreg 12.5 mg twice a day. Echocardiogram  on 10/26/2006 revealed ejection fraction of 55-65%. No other abnormalities were detected. EKG today  with atrial fibrillation.  - held coumadin pre-op; surgery to begin lovenox on 12/20 and coumadin today 12/19 - continue coreg for rate control  - continue telemetry   # Major depression: Stable. Continue with home Remeron and Zoloft   # Block AV complete: patient has a pacemaker in place; followed by [facility] previously. Pt states it is checked about every 3 months.   # Chronic thrombocytopenia: Platelet count 109 compared to 88 in 2009. No active bleeding.  -Monitor CBC   #Dispo- Disposition is deferred at this time, awaiting improvement of current medical problems.  Anticipated discharge in approximately 1-2 day(s).    LOS: 1 day   Boykin Peek, MD 05/28/2013, 4:01 PM

## 2013-05-28 NOTE — Progress Notes (Signed)
OT Cancellation Note  Patient Details Name: [name] MRN: [id number] DOB: 06/29/1945   Cancelled Treatment:    Reason Eval/Treat Not Completed: Other (comment) From ALF. Will need SNF. Defer OT to SNF. [facility] [name], OTR/L  [phone] 05/28/2013 05/28/2013, 5:28 PM

## 2013-05-28 NOTE — Progress Notes (Signed)
Pt in a-fib hr noted to be as high as 150 non-sustaining. Pt asymptomatic. MD notified. No new orders given at this time. Sanda Linger, RN

## 2013-05-28 NOTE — Anesthesia Preprocedure Evaluation (Addendum)
Anesthesia Evaluation  Patient identified by MRN, date of birth, ID band Patient awake    Reviewed: Allergy & Precautions, H&P , NPO status , Patient's Chart, lab work & pertinent test results, reviewed documented beta blocker date and time   Airway Mallampati: I TM Distance: >3 FB Neck ROM: Full    Dental  (+) Edentulous Upper and Edentulous Lower   Pulmonary Current Smoker,          Cardiovascular + Peripheral Vascular Disease + dysrhythmias Atrial Fibrillation     Neuro/Psych  Headaches, PSYCHIATRIC DISORDERS Depression    GI/Hepatic negative GI ROS, Neg liver ROS,   Endo/Other    Renal/GU Renal disease     Musculoskeletal   Abdominal   Peds  Hematology   Anesthesia Other Findings   Reproductive/Obstetrics                          Anesthesia Physical Anesthesia Plan  ASA: III  Anesthesia Plan: General   Post-op Pain Management:    Induction: Intravenous  Airway Management Planned: Oral ETT  Additional Equipment:   Intra-op Plan:   Post-operative Plan: Possible Post-op intubation/ventilation  Informed Consent: I have reviewed the patients History and Physical, chart, labs and discussed the procedure including the risks, benefits and alternatives for the proposed anesthesia with the patient or authorized representative who has indicated his/her understanding and acceptance.   Dental advisory given  Plan Discussed with: CRNA and Anesthesiologist  Anesthesia Plan Comments:         Anesthesia Quick Evaluation

## 2013-05-28 NOTE — Transfer of Care (Signed)
Immediate Anesthesia Transfer of Care Note  Patient: [name]  Procedure(s) Performed: Procedure(s): OPEN REDUCTION OF HIP (Right)  Patient Location: PACU  Anesthesia Type:General  Level of Consciousness: awake, alert  and oriented  Airway & Oxygen Therapy: Patient Spontanous Breathing and Patient connected to nasal cannula oxygen  Post-op Assessment: Report given to PACU RN, Post -op Vital signs reviewed and stable and Patient moving all extremities X 4  Post vital signs: Reviewed and stable  Complications: No apparent anesthesia complications

## 2013-05-28 NOTE — H&P (Signed)
Internal Medicine Attending Admission Note Date: 05/28/2013  Patient name: [name] Medical record number: [id number] Date of birth: 1946-01-19 Age: 67 y.o. Gender: male  I saw and evaluated the patient. I reviewed the resident's note and I agree with the resident's findings and plan as documented in the resident's note, with the following additional comments.  Chief Complaint(s): Right femur fracture  History - key components related to admission: Patient is a 67 year old man with history of atrial fibrillation, AV block status post pacemaker placement, depression, and other problems as outlined in the medical history admitted with a comminuted intertrochanteric and subtrochanteric right femur fracture sustained in a fall at the assisted-living facility.  I saw patient after he had returned to his room postoperatively.   Physical Exam - key components related to admission:  Filed Vitals:   05/28/13 0945 05/28/13 0953 05/28/13 1012 05/28/13 1226  BP:   153/64 149/69  Pulse: 76   94  Temp:  98.7 F (37.1 C)  97.2 F (36.2 C)  TempSrc:    Oral  Resp: 19   20  Height:      SpO2: 95%   96%    General: Alert, oriented Lungs: Clear Heart: Regular; no extra sounds or murmurs Abdomen: Bowel sounds present, soft, nontender Extremities: No edema; feet are somewhat cool but appear adequately perfused  Lab results:   Basic Metabolic Panel:  Recent Labs  46/96/29 1034 05/27/13 2040 05/28/13 0704  NA 137  --  135  K 4.0  --  3.7  CL 103  --  100  CO2 29  --  27  GLUCOSE 103*  --  132*  BUN 11  --  10  CREATININE 0.72  --  0.64  CALCIUM 9.1  --  9.0  MG  --   --  1.7  PHOS  --  2.9  --     Liver Function Tests:  Recent Labs  05/28/13 0704  AST 31  ALT 21  ALKPHOS 121*  BILITOT 0.9  PROT 7.1  ALBUMIN 3.6    CBC:  Recent Labs  05/27/13 1034 05/28/13 0704  WBC 5.5 6.3  HGB 12.6* 11.0*  HCT 38.6* 34.4*  MCV 98.2 97.7  PLT 109* 109*    Recent Labs  05/27/13 1034  NEUTROABS 3.4  LYMPHSABS 1.7  MONOABS 0.3  EOSABS 0.1  BASOSABS 0.0     CBG:  Recent Labs  05/27/13 2135 05/28/13 0010 05/28/13 0639 05/28/13 0913 05/28/13 1139  GLUCAP 137* 137* 134* 122* 126*     Coagulation:  Recent Labs  05/27/13 1034 05/28/13 0704  INR 1.06 1.09      Urinalysis    Component Value Date/Time   COLORURINE YELLOW 05/27/2013 1547   APPEARANCEUR CLOUDY* 05/27/2013 1547   LABSPEC 1.017 05/27/2013 1547   PHURINE 5.0 05/27/2013 1547   GLUCOSEU NEGATIVE 05/27/2013 1547   HGBUR NEGATIVE 05/27/2013 1547   BILIRUBINUR NEGATIVE 05/27/2013 1547   KETONESUR NEGATIVE 05/27/2013 1547   PROTEINUR NEGATIVE 05/27/2013 1547   UROBILINOGEN 0.2 05/27/2013 1547   NITRITE NEGATIVE 05/27/2013 1547   LEUKOCYTESUR NEGATIVE 05/27/2013 1547     Imaging results:  Dg Chest 1 View  05/27/2013   CLINICAL DATA:  Fall.  Hip pain.  Preoperative chest radiograph.  EXAM: CHEST - 1 VIEW  COMPARISON:  09/30/2007.  FINDINGS: Dual lead right subclavian pacemaker. The cardiopericardial silhouette is within normal limits for projection. Cholecystectomy clips are present in the right upper quadrant. No airspace disease. No  effusion. No displaced rib fractures in this patient with a history of fall.  IMPRESSION: No active cardiopulmonary disease.   Electronically Signed   By: Andreas Newport M.D.   On: 05/27/2013 11:29   Dg Hip Complete Left  05/27/2013   CLINICAL DATA:  Fall.  Right hip pain.  EXAM: LEFT HIP - COMPLETE 2+ VIEW  COMPARISON:  None.  FINDINGS: There is a comminuted intertrochanteric right hip fracture with sub trochanteric extension into the medial aspect of the proximal shaft. Diffuse osteopenia. The femoral head remains located. The obturator rings appear intact. Fracture fragments are minimally displaced.  IMPRESSION: Comminuted intertrochanteric and subtrochanteric right femur fracture.   Electronically Signed   By: Andreas Newport M.D.   On:  05/27/2013 11:28   Dg Hip Operative Right  05/28/2013   CLINICAL DATA:  Right femoral intertrochanteric fracture.  EXAM: DG OPERATIVE RIGHT HIP  TECHNIQUE: Spot fluoroscopic images of the right hip were submitted.  COMPARISON:  05/27/2013  FINDINGS: Again noted is a displaced right femoral intertrochanteric fracture. Intramedullary nail placement within the right femur. There is a hip screw extending through the right femoral head and neck. The intramedullary nail extends into the distal femur with a distal interlocking screw.  IMPRESSION: Internal fixation of the displaced right femoral intertrochanteric fracture.   Electronically Signed   By: Richarda Overlie M.D.   On: 05/28/2013 08:56   Dg Pelvis Portable  05/28/2013   CLINICAL DATA:  Right trochanteric fracture repair  EXAM: PORTABLE PELVIS 1-2 VIEWS  COMPARISON:  None.  FINDINGS: There is a minimally displaced right intertrochanteric fracture transfixed with a intra medullary nail and a cannulated femoral neck screw without hardware failure or complication. The distal aspect of the intra medullary nail is excluded from the field of view. There is no hip dislocation. There are postsurgical changes overlying the right hip.  The left hip is unremarkable.  IMPRESSION: ORIF right intertrochanteric fracture.   Electronically Signed   By: Elige Ko   On: 05/28/2013 09:10   Dg Femur Right Port  05/28/2013   CLINICAL DATA:  Right intertrochanteric fracture  EXAM: PORTABLE RIGHT FEMUR - 2 VIEW  COMPARISON:  None.  FINDINGS: Minimally displaced right intertrochanteric fracture transfixed with a right intra medullary nail and interlocking cannulated femoral neck screw. There is no dislocation. Postsurgical changes are noted over the right hip and distal right femur. There is no hardware failure or complication.  IMPRESSION: ORIF right intertrochanteric fracture.   Electronically Signed   By: Elige Ko   On: 05/28/2013 09:13    Other results: EKG: Atrial  fibrillation; right axis deviation; borderline prolonged QT interval; baseline wander in lead(s) V6   Assessment & Plan by Problem:  1. Comminuted intertrochanteric and subtrochanteric right femur fracture.  Patient underwent open reduction by Dr. Margarita Rana, and is currently alert and stable postop.  Plan is postoperative management including DVT prophylaxis and mobilization as per orthopedics.  2.  Atrial fibrillation.  Patient's rate is well controlled; plan is continue home dose of Coreg; monitor.  Patient was apparently on chronic anticoagulation with warfarin prior to admission.     3.  Other problems and plans as per the resident physician's note.

## 2013-05-28 NOTE — Progress Notes (Signed)
UR Completed Guiseppe Flanagan Graves-Bigelow, RN,BSN [phone]  

## 2013-05-28 NOTE — Op Note (Signed)
DATE OF SURGERY:  05/28/2013  TIME: 8:26 AM  PATIENT NAME:  [name]  AGE: 67 y.o.  PRE-OPERATIVE DIAGNOSIS:  Right hip intertroch fracture  POST-OPERATIVE DIAGNOSIS:  SAME  PROCEDURE:  OPEN REDUCTION OF HIP  SURGEON:  Augusten Lipkin, D  ASSISTANT:  None  OPERATIVE IMPLANTS: Stryker Gamma Nail with distal interlock screw  PREOPERATIVE INDICATIONS:  KAIEL WEIDE is a 67 y.o. year old who fell and suffered a hip fracture. He was brought into the ER and then admitted and optimized and then elected for surgical intervention.    The risks benefits and alternatives were discussed with the patient including but not limited to the risks of nonoperative treatment, versus surgical intervention including infection, bleeding, nerve injury, malunion, nonunion, hardware prominence, hardware failure, need for hardware removal, blood clots, cardiopulmonary complications, morbidity, mortality, among others, and they were willing to proceed.    OPERATIVE PROCEDURE:  The patient was brought to the operating room and placed in the supine position. General anesthesia was administered, with a foley. He was placed on the fracture table.  Closed reduction was performed under C-arm guidance. The length of the femur was also measured using fluoroscopy. Time out was then performed after sterile prep and drape. He received preoperative antibiotics.  Incision was made proximal to the greater trochanter. A guidewire was placed in the appropriate position. Confirmation was made on AP and lateral views. The above-named nail was opened. I opened the proximal femur with a reamer. I then placed the nail by hand easily down. I did not need to ream the femur.  Once the nail was completely seated, I placed a guidepin into the femoral head into the center center position. I measured the length, and then reamed the lateral cortex and up into the head. I then placed the lag screw. Slight compression was applied.  Anatomic fixation achieved. Bone quality was mediocre.  I then secured the proximal interlocking bolt, and took off a half a turn, and then removed the instruments, and took final C-arm pictures AP and lateral the entire length of the leg.  The knee is perfect circles technique to place a distal interlock screw.   Anatomic reconstruction was achieved, and the wounds were irrigated copiously and closed with Vicryl followed by staples and sterile gauze for the skin. The patient was awakened and returned to PACU in stable and satisfactory condition. There no complications and the patient tolerated the procedure well.  He will be weightbearing as tolerated, and will be on lovenox to bridge to coumadin  for a period of four weeks after discharge.   Margarita Rana, M.D.

## 2013-05-28 NOTE — Progress Notes (Signed)
Spoke with MD Eulah Pont about pt stating he was uncomfortable signing the consent. MD stated to send pt down to the OR with the consent & he would speak with him about it then. Sanda Linger, RN

## 2013-05-28 NOTE — Care Management Note (Signed)
    Page 1 of 1   05/31/2013     3:42:13 PM   CARE MANAGEMENT NOTE 05/31/2013  Patient:  [name], [name]   Account Number:  [id number]  Date Initiated:  05/28/2013  Documentation initiated by:  GRAVES-BIGELOW,Aniesa Boback  Subjective/Objective Assessment:   Pt is admitted from [facility] due to slipping on some water and falling, patient landed on his right hip, unable to ambulate after fall.R hip fracture. Plan for OPEN REDUCTION OF HIP (Right) 05-28-13.     Action/Plan:   Plan for PT to evaluate. Pt will probably benefit from SNF at d/c. CM will continue to monitor for disposition needs.   Anticipated DC Date:  06/01/2013   Anticipated DC Plan:  SKILLED NURSING FACILITY  In-house referral  Clinical Social Worker      DC Planning Services  CM consult      Choice offered to / List presented to:             Status of service:  Completed, signed off Medicare Important Message given?   (If response is "NO", the following Medicare IM given date fields will be blank) Date Medicare IM given:   Date Additional Medicare IM given:    Discharge Disposition:  SKILLED NURSING FACILITY  Per UR Regulation:  Reviewed for med. necessity/level of care/duration of stay  If discussed at Long Length of Stay Meetings, dates discussed:    Comments:  05-31-13 1541 Tomi Bamberger, RN, BSN [phone] Plan for d/c to [facility].

## 2013-05-29 LAB — CBC
Hemoglobin: 7.7 g/dL — ABNORMAL LOW (ref 13.0–17.0)
MCH: 32 pg (ref 26.0–34.0)
RBC: 2.41 MIL/uL — ABNORMAL LOW (ref 4.22–5.81)
RDW: 13.2 % (ref 11.5–15.5)

## 2013-05-29 LAB — BASIC METABOLIC PANEL
CO2: 23 mEq/L (ref 19–32)
Calcium: 8.3 mg/dL — ABNORMAL LOW (ref 8.4–10.5)
Glucose, Bld: 123 mg/dL — ABNORMAL HIGH (ref 70–99)
Potassium: 4.3 mEq/L (ref 3.5–5.1)
Sodium: 129 mEq/L — ABNORMAL LOW (ref 135–145)

## 2013-05-29 LAB — PROTIME-INR: INR: 1.37 (ref 0.00–1.49)

## 2013-05-29 MED ORDER — WARFARIN SODIUM 7.5 MG PO TABS
7.5000 mg | ORAL_TABLET | Freq: Once | ORAL | Status: AC
  Administered 2013-05-29: 7.5 mg via ORAL
  Filled 2013-05-29: qty 1

## 2013-05-29 MED ORDER — DIPHENHYDRAMINE HCL 25 MG PO CAPS
25.0000 mg | ORAL_CAPSULE | Freq: Once | ORAL | Status: AC
  Administered 2013-05-29: 25 mg via ORAL
  Filled 2013-05-29: qty 1

## 2013-05-29 MED ORDER — HYDROCODONE-ACETAMINOPHEN 5-325 MG PO TABS
2.0000 | ORAL_TABLET | Freq: Four times a day (QID) | ORAL | Status: DC | PRN
  Administered 2013-05-29 – 2013-05-30 (×3): 2 via ORAL
  Filled 2013-05-29 (×3): qty 2

## 2013-05-29 MED ORDER — ACETAMINOPHEN 325 MG PO TABS: 650.0000 mg | ORAL_TABLET | Freq: Once | ORAL | Status: DC

## 2013-05-29 MED ORDER — SODIUM CHLORIDE 0.9 % IV BOLUS (SEPSIS)
500.0000 mL | Freq: Once | INTRAVENOUS | Status: AC
  Administered 2013-05-29: 500 mL via INTRAVENOUS

## 2013-05-29 MED ORDER — DIPHENHYDRAMINE HCL 50 MG/ML IJ SOLN
12.5000 mg | Freq: Once | INTRAMUSCULAR | Status: AC
  Administered 2013-05-29: 12.5 mg via INTRAVENOUS
  Filled 2013-05-29: qty 1

## 2013-05-29 NOTE — Progress Notes (Signed)
     Subjective:  Patient reports pain as moderate.  Denies chest pain or shortness of breath.  Objective:   VITALS:   Filed Vitals:   05/29/13 0000 05/29/13 0005 05/29/13 0400 05/29/13 0800  BP:  124/71 104/59 132/75  Pulse:  89 75   Temp:  98.5 F (36.9 C) 98.6 F (37 C)   TempSrc:  Oral Oral   Resp: 22 18 18 14   Height:      SpO2: 96% 94% 94% 100%    Dressings are intact, minimal serous drainage. EHL and FHL are firing. Calves are soft.   Lab Results  Component Value Date   WBC 6.2 05/29/2013   HGB 7.7* 05/29/2013   HCT 23.0* 05/29/2013   MCV 95.4 05/29/2013   PLT 74* 05/29/2013     Assessment/Plan: 1 Day Post-Op   Principal Problem:   Closed comminuted intertrochanteric fracture of proximal end of right femur Active Problems:   MALNUTRITION, PROTEIN-CALORIE   THROMBOCYTOPENIA NOS   DEPRESSION, MAJOR, MODERATE   PSYCHIATRIC DISORDER   DISEASE, ISCHEMIC HEART, CHRONIC NOS   CARDIOMYOPATHY   BLOCK, AV, COMPLETE   ATHEROSCLEROSIS, AORTIC   RENAL INSUFFICIENCY, ACUTE   Atrial fibrillation   Fracture femur, cervicotrochanteric   Continue physical therapy, Coumadin for DVT prophylaxis Acute postoperative blood loss anemia, we'll transfuse 2 units given the fact that he is postoperative day one, with a hematocrit of 23. I will expect ongoing decreasing hematocrit for at least the next 24 hours. SNF planning.   Wrenley Sayed P 05/29/2013, 12:00 PM   [name], MD Cell [phone]

## 2013-05-29 NOTE — Progress Notes (Signed)
ANTICOAGULATION CONSULT NOTE - Follow Up Consult  Pharmacy Consult for warfarin Indication: atrial fibrillation and VTE prophylaxis  No Known Allergies  Patient Measurements: Height: 5\' 9"  (175.3 cm) IBW/kg (Calculated) : 70.7 Heparin Dosing Weight:   Vital Signs: Temp: 98.6 F (37 C) (12/20 0400) Temp src: Oral (12/20 0400) BP: 132/75 mmHg (12/20 0800) Pulse Rate: 75 (12/20 0400)  Labs:  Recent Labs  05/27/13 1034 05/28/13 0704 05/29/13 0459  HGB 12.6* 11.0* 7.7*  HCT 38.6* 34.4* 23.0*  PLT 109* 109* 74*  LABPROT 13.6 13.9 16.5*  INR 1.06 1.09 1.37  CREATININE 0.72 0.64 0.65    The CrCl is unknown because both a height and weight (above a minimum accepted value) are required for this calculation.   Medical History: Past Medical History  Diagnosis Date  . Depression   . Hypomagnesemia   . Dyspepsia   . Fall at nursing home 05/27/2013    "slipped on water; said I broke my right hip" (05/27/2013)  . Migraine     "used to have them bad; haven't had one in awhile" (05/27/2013)  . Gout attack     "not too long ago; in my right foot" (05/27/2013)  . Complete heart block     reason for pacemaker/notes 05/27/2013  . Atrial fibrillation     /notes 05/27/2013    Medications:  Prescriptions prior to admission  Medication Sig Dispense Refill  . alendronate (FOSAMAX) 35 MG tablet Take 35 mg by mouth every 7 (seven) days. Take with a full glass of water on an empty stomach.      Marland Kitchen aspirin 81 MG chewable tablet Chew 81 mg by mouth daily.      . Calcium Carbonate-Vit D-Min (CALTRATE 600+D PLUS MINERALS) 600-800 MG-UNIT TABS Take 1 tablet by mouth daily.      . carvedilol (COREG) 12.5 MG tablet Take 12.5 mg by mouth 2 (two) times daily with a meal.      . colchicine 0.6 MG tablet Take 0.6 mg by mouth every 6 (six) hours as needed (pain).      Marland Kitchen doxazosin (CARDURA) 4 MG tablet Take 4 mg by mouth every morning.      . finasteride (PROSCAR) 5 MG tablet Take 5 mg by mouth  every morning.      Marland Kitchen lisinopril (PRINIVIL,ZESTRIL) 5 MG tablet Take 5 mg by mouth every morning.      . magnesium oxide (MAG-OX) 400 MG tablet Take 400 mg by mouth 2 (two) times daily.      . Melatonin 1 MG TABS Take 1 mg by mouth at bedtime.      . mirtazapine (REMERON) 15 MG tablet Take 15 mg by mouth every morning.      . pravastatin (PRAVACHOL) 20 MG tablet Take 20 mg by mouth at bedtime.      . sertraline (ZOLOFT) 50 MG tablet Take 50 mg by mouth daily.      . Vitamin D, Ergocalciferol, (DRISDOL) 50000 UNITS CAPS capsule Take 50,000 Units by mouth every 7 (seven) days.      Marland Kitchen warfarin (COUMADIN) 1 MG tablet Take 0.5-1 mg by mouth daily. Take 0.5 mg on Monday and Friday along with 6mg  tablet to equal 6.5mg .  Take 1mg  tablet along with 6 mg tablet to equal 7mg  on all other days of the week.      . warfarin (COUMADIN) 6 MG tablet Take 6 mg by mouth daily. Take 0.5 mg on Monday and Friday along with 6mg  tablet  to equal 6.5mg .  Take 1mg  tablet along with 6 mg tablet to equal 7mg  on all other days of the week.        Assessment: 67 yom on chronic coumadin for afib presented to the hospital after a fall. Now s/p ORIF of R hip. Coumadin restarted last night. Lovenox prophylaxis started this morning (to be d/c'd when INR 1.8 or higher per order comments).   INR is subtherapeutic today at 1.37. H/H low at 7.7/23, and plts are chronically low though further decreased today to 74 though patient is POD #1. No overt bleeding noted per RN.   Goal of Therapy:  INR 2-3   Plan:  1. Warfarin 7.5mg  PO x 1 tonight 2. Daily INR 3. Monitor signs of bleeding.  Swetha Rayle C. Seiji Wiswell, PharmD Clinical Pharmacist-Resident Pager: [phone] Pharmacy: [phone] 05/29/2013 9:18 AM

## 2013-05-29 NOTE — Progress Notes (Signed)
Dr. Zada Girt updated with Temp and BP. New order for NS bolus .

## 2013-05-29 NOTE — Progress Notes (Signed)
Orthopedic Tech Progress Note Patient Details:  [name] 07-Dec-1945 [id number]  Patient ID: [name], male   DOB: [birth year], 67 y.o.   MRN: [id number]  Trapeze bar patient helper Nikki Dom 05/29/2013, 3:14 PM

## 2013-05-29 NOTE — Evaluation (Signed)
Physical Therapy Evaluation Patient Details Name: [name] MRN: [id number] DOB: [birth year] Today's Date: 05/29/2013 Time: 6578-4696 PT Time Calculation (min): 32 min  PT Assessment / Plan / Recommendation History of Present Illness  Pt adm s/p fall at ALF (slipped in bathroom on water). Pt previously homeless. PMHx includes psych d/o, depression, cardiomyopathy, afib, PPM.  Clinical Impression  Patient is s/p above surgery resulting in functional limitations due to the deficits listed below (see PT Problem List).  Patient will benefit from skilled PT to increase their independence and safety with mobility to allow discharge to the venue listed below.       PT Assessment  Patient needs continued PT services    Follow Up Recommendations  SNF    Does the patient have the potential to tolerate intense rehabilitation      Barriers to Discharge Decreased caregiver support      Equipment Recommendations  Rolling walker with 5" wheels    Recommendations for Other Services     Frequency Min 3X/week    Precautions / Restrictions Precautions Precautions: Fall Restrictions Weight Bearing Restrictions: Yes RLE Weight Bearing: Weight bearing as tolerated   Pertinent Vitals/Pain 6/10 Rt hip; patient repositioned for comfort; pt premedicated for pain      Mobility  Bed Mobility Bed Mobility: Supine to Sit;Sitting - Scoot to [place] of Bed;Sit to Supine;Scooting to [facility] Supine to Sit: 4: Min assist;HOB elevated;With rails Sitting - Scoot to Edge of Bed: 5: Supervision Sit to Supine: 4: Min assist;HOB flat Scooting to [facility]: 5: Supervision Details for Bed Mobility Assistance: Pt initially insistent he could not move/sit up; after LE exercises, agreed to try. Pt moves slowly, but prefers to move his leg himself; pt almost able to lift his leg up onto bed with sit to supine; able to sit in long-sitting to scoot himself up to [facility] Transfers Transfers: Sit to Stand Sit to Stand: Other  (comment) (unable ) Details for Transfer Assistance: Pt with poor effort as attempted to stand x 2 from elevated bed: "I just can't do it today"    Exercises General Exercises - Lower Extremity Ankle Circles/Pumps: AROM;Both;10 reps;Supine Quad Sets: AROM;Both;10 reps;Supine Heel Slides: AAROM;Right;10 reps;Supine Hip ABduction/ADduction: AAROM;Right;10 reps;Supine   PT Diagnosis: Difficulty walking;Acute pain  PT Problem List: Decreased range of motion;Decreased activity tolerance;Decreased balance;Decreased mobility;Decreased knowledge of use of DME;Pain PT Treatment Interventions: DME instruction;Gait training;Functional mobility training;Therapeutic activities;Therapeutic exercise;Patient/family education     PT Goals(Current goals can be found in the care plan section) Acute Rehab PT Goals Patient Stated Goal: agrees he wants to be able to walk again PT Goal Formulation: With patient Time For Goal Achievement: 06/05/13 Potential to Achieve Goals: Good  Visit Information  Last PT Received On: 05/29/13 Assistance Needed: +2 History of Present Illness: Pt adm s/p fall at ALF (slipped in bathroom on water). Pt previously homeless. PMHx includes psych d/o, depression, cardiomyopathy, afib, PPM.       Prior Functioning  Home Living Family/patient expects to be discharged to:: Skilled nursing facility Additional Comments: from ALF Prior Function Level of Independence: Independent Communication Communication: No difficulties Dominant Hand: Right    Cognition  Cognition Arousal/Alertness: Awake/alert Behavior During Therapy: Anxious Overall Cognitive Status: No family/caregiver present to determine baseline cognitive functioning (per chart has appointed legal guardian )    Extremity/Trunk Assessment Upper Extremity Assessment Upper Extremity Assessment: Overall WFL for tasks assessed Lower Extremity Assessment Lower Extremity Assessment: RLE deficits/detail RLE: Unable to  fully assess due to pain  Cervical / Trunk Assessment Cervical / Trunk Assessment: Kyphotic   Balance    End of Session PT - End of Session Equipment Utilized During Treatment:  (pt refused use of gait belt when attempting to stand) Activity Tolerance: Patient limited by pain Patient left: in bed;with call bell/phone within reach;with bed alarm set Nurse Communication: Mobility status  GP     [name] 05/29/2013, 11:17 AM Pager [phone]

## 2013-05-29 NOTE — Progress Notes (Signed)
Subjective:  Feeling tired today. Did not have his breakfast. Complaining of pain and was unable to participate in PT.   Objective:  Vital signs in last 24 hours: Filed Vitals:   05/29/13 0000 05/29/13 0005 05/29/13 0400 05/29/13 0800  BP:  124/71 104/59 132/75  Pulse:  89 75   Temp:  98.5 F (36.9 C) 98.6 F (37 C)   TempSrc:  Oral Oral   Resp: 22 18 18 14   Height:      SpO2: 96% 94% 94% 100%   Weight change:   Intake/Output Summary (Last 24 hours) at 05/29/13 1229 Last data filed at 05/29/13 1000  Gross per 24 hour  Intake    935 ml  Output    875 ml  Net     60 ml    Physical Exam: Constitutional: Vital signs reviewed.  Patient is chronically ill appearing lying in bed in NAD.     Cardiovascular: RRR, S1, S2 present, no MRG, DP 2+ b/l Pulmonary/Chest: normal respiratory effort, CTAB, no wheezes, rales, or rhonchi Abdominal: Soft. +BS, Non-tender, non-distended Musculoskeletal: R hip s/p open reduction, L hip wnl, pulses are present in both LEs Neurological: A&O x3 Skin: Cool extremities (b/l below both knees), dry and intact. Lab Results:  BMP:  Recent Labs Lab 05/27/13 1034 05/27/13 2040 05/28/13 0704 05/29/13 0459  NA 137  --  135 129*  K 4.0  --  3.7 4.3  CL 103  --  100 97  CO2 29  --  27 23  GLUCOSE 103*  --  132* 123*  BUN 11  --  10 10  CREATININE 0.72  --  0.64 0.65  CALCIUM 9.1  --  9.0 8.3*  MG  --   --  1.7  --   PHOS  --  2.9  --   --     CBC:  Recent Labs Lab 05/27/13 1034 05/28/13 0704 05/29/13 0459  WBC 5.5 6.3 6.2  NEUTROABS 3.4  --   --   HGB 12.6* 11.0* 7.7*  HCT 38.6* 34.4* 23.0*  MCV 98.2 97.7 95.4  PLT 109* 109* 74*    Coagulation:  Recent Labs Lab 05/27/13 1034 05/28/13 0704 05/29/13 0459  LABPROT 13.6 13.9 16.5*  INR 1.06 1.09 1.37    CBG:            Recent Labs Lab 05/27/13 2135 05/28/13 0010 05/28/13 0639 05/28/13 0913 05/28/13 1139  GLUCAP 137* 137* 134* 122* 126*          LFTs:  Recent  Labs Lab 05/28/13 0704  AST 31  ALT 21  ALKPHOS 121*  BILITOT 0.9  PROT 7.1  ALBUMIN 3.6   EKG:  Date/Time:  Thursday May 27 2013 10:09:07 EST Ventricular Rate:  92 PR Interval:    QRS Duration: 99 QT Interval:  394 QTC Calculation: 487 R Axis:   114 Text Interpretation:  Atrial fibrillation Right axis deviation Borderline prolonged QT interval Baseline wander in lead(s) V6 Confirmed by Bebe Shaggy  MD, DONALD [phone]) on 05/27/2013 10:11:59 AM   Urinalysis:  Recent Labs Lab 05/27/13 1547  COLORURINE YELLOW  LABSPEC 1.017  PHURINE 5.0  GLUCOSEU NEGATIVE  HGBUR NEGATIVE  BILIRUBINUR NEGATIVE  KETONESUR NEGATIVE  PROTEINUR NEGATIVE  UROBILINOGEN 0.2  NITRITE NEGATIVE  LEUKOCYTESUR NEGATIVE     Studies/Results: Dg Hip Operative Right  05/28/2013   CLINICAL DATA:  Right femoral intertrochanteric fracture.  EXAM: DG OPERATIVE RIGHT HIP  TECHNIQUE: Spot fluoroscopic images of the right  hip were submitted.  COMPARISON:  05/27/2013  FINDINGS: Again noted is a displaced right femoral intertrochanteric fracture. Intramedullary nail placement within the right femur. There is a hip screw extending through the right femoral head and neck. The intramedullary nail extends into the distal femur with a distal interlocking screw.  IMPRESSION: Internal fixation of the displaced right femoral intertrochanteric fracture.   Electronically Signed   By: Richarda Overlie M.D.   On: 05/28/2013 08:56   Dg Pelvis Portable  05/28/2013   CLINICAL DATA:  Right trochanteric fracture repair  EXAM: PORTABLE PELVIS 1-2 VIEWS  COMPARISON:  None.  FINDINGS: There is a minimally displaced right intertrochanteric fracture transfixed with a intra medullary nail and a cannulated femoral neck screw without hardware failure or complication. The distal aspect of the intra medullary nail is excluded from the field of view. There is no hip dislocation. There are postsurgical changes overlying the right hip.  The left  hip is unremarkable.  IMPRESSION: ORIF right intertrochanteric fracture.   Electronically Signed   By: Elige Ko   On: 05/28/2013 09:10   Dg Femur Right Port  05/28/2013   CLINICAL DATA:  Right intertrochanteric fracture  EXAM: PORTABLE RIGHT FEMUR - 2 VIEW  COMPARISON:  None.  FINDINGS: Minimally displaced right intertrochanteric fracture transfixed with a right intra medullary nail and interlocking cannulated femoral neck screw. There is no dislocation. Postsurgical changes are noted over the right hip and distal right femur. There is no hardware failure or complication.  IMPRESSION: ORIF right intertrochanteric fracture.   Electronically Signed   By: Elige Ko   On: 05/28/2013 09:13    Medications:  Scheduled Meds: . acetaminophen  650 mg Oral Once  . carvedilol  12.5 mg Oral BID WC  . diphenhydrAMINE  25 mg Oral Once  . enoxaparin (LOVENOX) injection  40 mg Subcutaneous Q24H  . mirtazapine  15 mg Oral BH-q7a  . sertraline  50 mg Oral Daily  . Vitamin D (Ergocalciferol)  50,000 Units Oral Q7 days  . warfarin  7.5 mg Oral ONCE-1800  . Warfarin - Pharmacist Dosing Inpatient   Does not apply q1800   Continuous Infusions:   PRN Meds:.acetaminophen, acetaminophen, HYDROcodone-acetaminophen, HYDROmorphone (DILAUDID) injection, menthol-cetylpyridinium, metoCLOPramide (REGLAN) injection, metoCLOPramide, morphine injection, ondansetron (ZOFRAN) IV, ondansetron, phenol  Antibiotics: Anti-infectives   Start     Dose/Rate Route Frequency Ordered Stop   05/28/13 1400  ceFAZolin (ANCEF) IVPB 2 g/50 mL premix     2 g 100 mL/hr over 30 Minutes Intravenous Every 6 hours 05/28/13 1013 05/28/13 2033   05/28/13 0600  [MAR Hold]  ceFAZolin (ANCEF) 2 g in dextrose 5 % 50 mL IVPB     (On MAR Hold since 05/28/13 0629)   2 g 140 mL/hr over 30 Minutes Intravenous On call to O.R. 05/27/13 1439 05/28/13 0735     Antibiotics Given (last 72 hours)   Date/Time Action Medication Dose Rate   05/28/13  0735 Given   [MAR Hold] ceFAZolin (ANCEF) 2 g in dextrose 5 % 50 mL IVPB (On MAR Hold since 05/28/13 0629) 2 g    05/28/13 1615 Given   ceFAZolin (ANCEF) IVPB 2 g/50 mL premix 2 g 100 mL/hr   05/28/13 2003 Given   ceFAZolin (ANCEF) IVPB 2 g/50 mL premix 2 g 100 mL/hr     Day of Hospitalization:  2 Consults: Treatment Team:  Sheral Apley, MD  Assessment/Plan: Principal Problem:   Closed comminuted intertrochanteric fracture of proximal end of right  femur Active Problems:   MALNUTRITION, PROTEIN-CALORIE   THROMBOCYTOPENIA NOS   DEPRESSION, MAJOR, MODERATE   PSYCHIATRIC DISORDER   DISEASE, ISCHEMIC HEART, CHRONIC NOS   CARDIOMYOPATHY   BLOCK, AV, COMPLETE   ATHEROSCLEROSIS, AORTIC   RENAL INSUFFICIENCY, ACUTE   Atrial fibrillation   Fracture femur, cervicotrochanteric  # Fracture of the right femur: s/p open reduction of the hip on 05/28/13.  Pt with mechanical fall on 05/27/2013 at 8:30 AM. XR revealed comminuted intertrochanteric and subtrochanteric right femur fracture. Of note, pt is a smoker and is on fosamax and calcium w/VitD although osteopenia/osteoporosis is not listed as part of his PMH. No BMD/DEXA report. Also, pt has a h/o hypomagnesium and takes magnesium supplementation.   Plan. - frequent neurovascular checks for compartment syndrome  - maintain on Vicodin 2 pills every 6 hours when necessary - Orthopedics following (Dr. Eulah Pont)  - PT/OT  # Acute blood loss anemia: Postoperative blood loss. Hemoglobin from 11.0>7.7 postop. Plan. -Transfusion with 1 unit of PRBC orthopedic - check Hgb tomorrow am  # Atrial Fibrillation: Stable. Pt reports he gets frequent INR checks from his nursing home. INR is 1.6 today. On home Coreg 12.5 mg twice a day. Echocardiogram on 10/26/2006 revealed ejection fraction of 55-65%. No other abnormalities were detected. EKG  with atrial fibrillation.  - held coumadin pre-op; surgery - start lovenox today  - continue coreg for rate  control  - continue telemetry   # Major depression: Stable. Continue with home Remeron and Zoloft   # Block AV complete: patient has a pacemaker in place; followed by [facility] previously. Pt states it is checked about every 3 months.   # Chronic thrombocytopenia: Platelet count 109 compared to 88 in 2009. No active bleeding.  -Monitor CBC   #Dispo- Disposition is deferred at this time, awaiting improvement of current medical problems.  Anticipated discharge in approximately 1-2 day(s).    LOS: 2 days   Dow Adolph, MD 05/29/2013, 12:29 PM

## 2013-05-29 NOTE — Progress Notes (Signed)
Patient's temp 100.8. 2nd unit of PRBC's to be given. Notified MD on call. New orders for IV Benadryl and Tylenol before starting 2nd unit. Will continue to monitor. Earnest Conroy RN

## 2013-05-30 LAB — CBC
Hemoglobin: 10.2 g/dL — ABNORMAL LOW (ref 13.0–17.0)
MCH: 31.7 pg (ref 26.0–34.0)
MCHC: 33.3 g/dL (ref 30.0–36.0)
MCV: 95 fL (ref 78.0–100.0)
Platelets: 71 10*3/uL — ABNORMAL LOW (ref 150–400)
RDW: 13.6 % (ref 11.5–15.5)
WBC: 6 10*3/uL (ref 4.0–10.5)

## 2013-05-30 LAB — PROTIME-INR: Prothrombin Time: 28.9 seconds — ABNORMAL HIGH (ref 11.6–15.2)

## 2013-05-30 LAB — BASIC METABOLIC PANEL
BUN: 9 mg/dL (ref 6–23)
CO2: 29 mEq/L (ref 19–32)
Calcium: 8.4 mg/dL (ref 8.4–10.5)
Chloride: 101 mEq/L (ref 96–112)
Creatinine, Ser: 0.7 mg/dL (ref 0.50–1.35)
GFR calc Af Amer: >90 mL/min (ref >90)
GFR calc non Af Amer: >90 mL/min (ref >90)

## 2013-05-30 LAB — TYPE AND SCREEN
ABO/RH(D): O POS
Unit division: 0
Unit division: 0

## 2013-05-30 MED ORDER — WARFARIN SODIUM 3 MG PO TABS
3.0000 mg | ORAL_TABLET | Freq: Once | ORAL | Status: DC
  Filled 2013-05-30: qty 1

## 2013-05-30 MED ORDER — HYDROCODONE-ACETAMINOPHEN 5-325 MG PO TABS
2.0000 | ORAL_TABLET | Freq: Four times a day (QID) | ORAL | Status: DC
  Administered 2013-05-30 – 2013-05-31 (×6): 2 via ORAL
  Filled 2013-05-30 (×6): qty 2

## 2013-05-30 MED ORDER — MORPHINE SULFATE 2 MG/ML IJ SOLN
2.0000 mg | INTRAMUSCULAR | Status: DC | PRN
  Administered 2013-05-30 (×2): 2 mg via INTRAVENOUS
  Filled 2013-05-30 (×2): qty 1

## 2013-05-30 MED ORDER — WARFARIN SODIUM 1 MG PO TABS
1.0000 mg | ORAL_TABLET | Freq: Once | ORAL | Status: AC
  Administered 2013-05-30: 1 mg via ORAL
  Filled 2013-05-30: qty 1

## 2013-05-30 NOTE — Progress Notes (Signed)
     Subjective:  Patient reports pain as moderate.  No new complaints.  Objective:   VITALS:   Filed Vitals:   05/30/13 0030 05/30/13 0557 05/30/13 0800 05/30/13 0808  BP: 107/64 149/75  124/76  Pulse: 78 78  85  Temp: 99.1 F (37.3 C) 99.4 F (37.4 C)    TempSrc: Oral Oral    Resp: 16 18 19    Height:      SpO2: 95% 95% 94%     Neurologically intact Dorsiflexion/Plantar flexion intact Incision: no drainage and dressings changed proximally, no drainage or cellulitis.   Lab Results  Component Value Date   WBC 6.0 05/30/2013   HGB 10.2* 05/30/2013   HCT 30.6* 05/30/2013   MCV 95.0 05/30/2013   PLT 71* 05/30/2013     Assessment/Plan: 2 Days Post-Op   Principal Problem:   Closed comminuted intertrochanteric fracture of proximal end of right femur Active Problems:   MALNUTRITION, PROTEIN-CALORIE   THROMBOCYTOPENIA NOS   DEPRESSION, MAJOR, MODERATE   PSYCHIATRIC DISORDER   DISEASE, ISCHEMIC HEART, CHRONIC NOS   CARDIOMYOPATHY   BLOCK, AV, COMPLETE   ATHEROSCLEROSIS, AORTIC   RENAL INSUFFICIENCY, ACUTE   Atrial fibrillation   Fracture femur, cervicotrochanteric   Advance diet Up with therapy Discharge to SNF when bed available per primary team.   [name] P 05/30/2013, 11:03 AM   Damon Lucy, MD Cell [phone]

## 2013-05-30 NOTE — Clinical Social Work Placement (Addendum)
Clinical Social Work Department CLINICAL SOCIAL WORK PLACEMENT NOTE 05/30/2013  Patient:  [name], [name]  Account Number:  [id number] Admit date:  05/27/2013  Clinical Social Worker:  Lavell Luster  Date/time:  05/30/2013 02:53 PM  Clinical Social Work is seeking post-discharge placement for this patient at the following level of care:   SKILLED NURSING   (*CSW will update this form in Epic as items are completed)   05/30/2013  Patient/family provided with [facility] Department of Clinical Social Work's list of facilities offering this level of care within the geographic area requested by the patient (or if unable, by the patient's family).  05/30/2013  Patient/family informed of their freedom to choose among providers that offer the needed level of care, that participate in Medicare, Medicaid or managed care program needed by the patient, have an available bed and are willing to accept the patient.  05/30/2013  Patient/family informed of [facility]' ownership interest in [facility], as well as of the fact that they are under no obligation to receive care at this facility.  PASARR submitted to EDS on 05/30/2013 PASARR number received from EDS on   FL2 transmitted to all facilities in geographic area requested by pt/family on  05/30/2013 FL2 transmitted to all facilities within larger geographic area on   Patient informed that his/her managed care company has contracts with or will negotiate with  certain facilities, including the following:     Patient/family informed of bed offers received:   Patient chooses bed at  Physician recommends and patient chooses bed at    Patient to be transferred to [facility] on 05/31/13- Jetta Lout, LCSWA   Patient to be transferred to facility by Non-emergency EMS ([facility])- Jetta Lout, LCSWA   The following physician request were entered in Epic:   Additional Comments:   Zada Finders, Bridget Hartshorn,  [phone]

## 2013-05-30 NOTE — Progress Notes (Signed)
Subjective:  Feeling better today. No acute overnight events. Still complaining of pain. Appetite is good.   Objective:  Vital signs in last 24 hours: Filed Vitals:   05/30/13 0030 05/30/13 0557 05/30/13 0800 05/30/13 0808  BP: 107/64 149/75  124/76  Pulse: 78 78  85  Temp: 99.1 F (37.3 C) 99.4 F (37.4 C)    TempSrc: Oral Oral    Resp: 16 18 19    Height:      SpO2: 95% 95% 94%    Weight change:   Intake/Output Summary (Last 24 hours) at 05/30/13 1012 Last data filed at 05/30/13 0558  Gross per 24 hour  Intake   1028 ml  Output   2450 ml  Net  -1422 ml    Physical Exam: Constitutional: Vital signs reviewed.  Patient is chronically ill appearing lying in bed in NAD.     Cardiovascular: RRR, S1, S2 present, no MRG, DP 2+ b/l Pulmonary/Chest: normal respiratory effort, CTAB, no wheezes, rales, or rhonchi Abdominal: Soft. +BS, Non-tender, non-distended Musculoskeletal: R hip s/p open reduction, L hip wnl, pulses are present in both LEs Neurological: A&O x3 Skin: Cool extremities (b/l below both knees), dry and intact. Lab Results:  BMP:  Recent Labs Lab 05/27/13 1034 05/27/13 2040 05/28/13 0704 05/29/13 0459 05/30/13 0455  NA 137  --  135 129* 137  K 4.0  --  3.7 4.3 4.1  CL 103  --  100 97 101  CO2 29  --  27 23 29   GLUCOSE 103*  --  132* 123* 98  BUN 11  --  10 10 9   CREATININE 0.72  --  0.64 0.65 0.70  CALCIUM 9.1  --  9.0 8.3* 8.4  MG  --   --  1.7  --   --   PHOS  --  2.9  --   --   --     CBC:  Recent Labs Lab 05/27/13 1034  05/29/13 0459 05/30/13 0455  WBC 5.5  < > 6.2 6.0  NEUTROABS 3.4  --   --   --   HGB 12.6*  < > 7.7* 10.2*  HCT 38.6*  < > 23.0* 30.6*  MCV 98.2  < > 95.4 95.0  PLT 109*  < > 74* 71*  < > = values in this interval not displayed.  Coagulation:  Recent Labs Lab 05/27/13 1034 05/28/13 0704 05/29/13 0459 05/30/13 0455  LABPROT 13.6 13.9 16.5* 28.9*  INR 1.06 1.09 1.37 2.85*    CBG:            Recent  Labs Lab 05/27/13 2135 05/28/13 0010 05/28/13 0639 05/28/13 0913 05/28/13 1139  GLUCAP 137* 137* 134* 122* 126*          LFTs:  Recent Labs Lab 05/28/13 0704  AST 31  ALT 21  ALKPHOS 121*  BILITOT 0.9  PROT 7.1  ALBUMIN 3.6   EKG:  Date/Time:  Thursday May 27 2013 10:09:07 EST Ventricular Rate:  92 PR Interval:    QRS Duration: 99 QT Interval:  394 QTC Calculation: 487 R Axis:   114 Text Interpretation:  Atrial fibrillation Right axis deviation Borderline prolonged QT interval Baseline wander in lead(s) V6 Confirmed by Bebe Shaggy  MD, DONALD [phone]) on 05/27/2013 10:11:59 AM   Urinalysis:  Recent Labs Lab 05/27/13 1547  COLORURINE YELLOW  LABSPEC 1.017  PHURINE 5.0  GLUCOSEU NEGATIVE  HGBUR NEGATIVE  BILIRUBINUR NEGATIVE  KETONESUR NEGATIVE  PROTEINUR NEGATIVE  UROBILINOGEN 0.2  NITRITE  NEGATIVE  LEUKOCYTESUR NEGATIVE    Studies/Results: No results found.  Medications:  Scheduled Meds: . carvedilol  12.5 mg Oral BID WC  . mirtazapine  15 mg Oral BH-q7a  . sertraline  50 mg Oral Daily  . Vitamin D (Ergocalciferol)  50,000 Units Oral Q7 days  . warfarin  1 mg Oral ONCE-1800  . Warfarin - Pharmacist Dosing Inpatient   Does not apply q1800   Continuous Infusions:   PRN Meds:.HYDROcodone-acetaminophen, menthol-cetylpyridinium, metoCLOPramide (REGLAN) injection, metoCLOPramide, morphine injection, ondansetron (ZOFRAN) IV, ondansetron, phenol  Antibiotics: Anti-infectives   Start     Dose/Rate Route Frequency Ordered Stop   05/28/13 1400  ceFAZolin (ANCEF) IVPB 2 g/50 mL premix     2 g 100 mL/hr over 30 Minutes Intravenous Every 6 hours 05/28/13 1013 05/28/13 2033   05/28/13 0600  [MAR Hold]  ceFAZolin (ANCEF) 2 g in dextrose 5 % 50 mL IVPB     (On MAR Hold since 05/28/13 0629)   2 g 140 mL/hr over 30 Minutes Intravenous On call to O.R. 05/27/13 1439 05/28/13 0735     Antibiotics Given (last 72 hours)   Date/Time Action Medication Dose Rate    05/28/13 0735 Given   [MAR Hold] ceFAZolin (ANCEF) 2 g in dextrose 5 % 50 mL IVPB (On MAR Hold since 05/28/13 0629) 2 g    05/28/13 1615 Given   ceFAZolin (ANCEF) IVPB 2 g/50 mL premix 2 g 100 mL/hr   05/28/13 2003 Given   ceFAZolin (ANCEF) IVPB 2 g/50 mL premix 2 g 100 mL/hr     Day of Hospitalization:  3 Consults: Treatment Team:  Sheral Apley, MD  Assessment/Plan: Principal Problem:   Closed comminuted intertrochanteric fracture of proximal end of right femur Active Problems:   MALNUTRITION, PROTEIN-CALORIE   THROMBOCYTOPENIA NOS   DEPRESSION, MAJOR, MODERATE   PSYCHIATRIC DISORDER   DISEASE, ISCHEMIC HEART, CHRONIC NOS   CARDIOMYOPATHY   BLOCK, AV, COMPLETE   ATHEROSCLEROSIS, AORTIC   RENAL INSUFFICIENCY, ACUTE   Atrial fibrillation   Fracture femur, cervicotrochanteric  # Fracture of the right femur: s/p open reduction of the hip on 05/28/13.  Pt with mechanical fall on 05/27/2013. X Ray revealed comminuted intertrochanteric and subtrochanteric right femur fracture. Of note, pt is a smoker and is on fosamax and calcium w/VitD although osteopenia/osteoporosis is not listed as part of his PMH. No BMD/DEXA report. Also, pt has a h/o hypomagnesium and takes magnesium supplementation. Pain not well controlled with prn vicodin 10mg   Plan. - frequent neurovascular checks for compartment syndrome  - change vicodin to schedules q8hr - add morphine 2 mg q2 prm   - Orthopedics following (Dr. Eulah Pont)  - PT/OT - awaiting for SNF placement  # Acute blood loss anemia: Postoperative blood loss. Hemoglobin from 11.0>7.7 postop. Post-transfusion Hgb 10.2.  Plan - check Hgb in am.  # Atrial Fibrillation: Stable. Pt reports he gets frequent INR checks from his nursing home. INR is 1.6 today. On home Coreg 12.5 mg twice a day. Echocardiogram on 10/26/2006 revealed ejection fraction of 55-65%. No other abnormalities were detected. EKG  with atrial fibrillation.  - restarted coumadin  post-op per surgery, INR 2.85 - continue coreg for rate control  - continue telemetry   # Major depression: Stable. 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 Pt states it is checked about every 3 months.   # Chronic thrombocytopenia: Platelet count  109 compared to 88 in 2009. No active bleeding.  -Monitor CBC   #Dispo- Disposition is deferred at this time, awaiting improvement of current medical problems and availability of SNF bed.      LOS: 3 days   Dow Adolph, MD 05/30/2013, 10:12 AM

## 2013-05-30 NOTE — Progress Notes (Addendum)
ANTICOAGULATION CONSULT NOTE - Follow Up Consult  Pharmacy Consult for warfarin Indication: atrial fibrillation and VTE prophylaxis  No Known Allergies  Patient Measurements: Height: 5\' 9"  (175.3 cm) IBW/kg (Calculated) : 70.7 Heparin Dosing Weight:   Vital Signs: Temp: 99.4 F (37.4 C) (12/21 0557) Temp src: Oral (12/21 0557) BP: 124/76 mmHg (12/21 0808) Pulse Rate: 85 (12/21 0808)  Labs:  Recent Labs  05/28/13 0704 05/29/13 0459 05/30/13 0455  HGB 11.0* 7.7* 10.2*  HCT 34.4* 23.0* 30.6*  PLT 109* 74* 71*  LABPROT 13.9 16.5* 28.9*  INR 1.09 1.37 2.85*  CREATININE 0.64 0.65 0.70    The CrCl is unknown because both a height and weight (above a minimum accepted value) are required for this calculation.   Medical History: Past Medical History  Diagnosis Date  . Depression   . Hypomagnesemia   . Dyspepsia   . Fall at nursing home 05/27/2013    "slipped on water; said I broke my right hip" (05/27/2013)  . Migraine     "used to have them bad; haven't had one in awhile" (05/27/2013)  . Gout attack     "not too long ago; in my right foot" (05/27/2013)  . Complete heart block     reason for pacemaker/notes 05/27/2013  . Atrial fibrillation     /notes 05/27/2013    Medications:  Prescriptions prior to admission  Medication Sig Dispense Refill  . alendronate (FOSAMAX) 35 MG tablet Take 35 mg by mouth every 7 (seven) days. Take with a full glass of water on an empty stomach.      Marland Kitchen aspirin 81 MG chewable tablet Chew 81 mg by mouth daily.      . Calcium Carbonate-Vit D-Min (CALTRATE 600+D PLUS MINERALS) 600-800 MG-UNIT TABS Take 1 tablet by mouth daily.      . carvedilol (COREG) 12.5 MG tablet Take 12.5 mg by mouth 2 (two) times daily with a meal.      . colchicine 0.6 MG tablet Take 0.6 mg by mouth every 6 (six) hours as needed (pain).      Marland Kitchen doxazosin (CARDURA) 4 MG tablet Take 4 mg by mouth every morning.      . finasteride (PROSCAR) 5 MG tablet Take 5 mg by  mouth every morning.      Marland Kitchen lisinopril (PRINIVIL,ZESTRIL) 5 MG tablet Take 5 mg by mouth every morning.      . magnesium oxide (MAG-OX) 400 MG tablet Take 400 mg by mouth 2 (two) times daily.      . Melatonin 1 MG TABS Take 1 mg by mouth at bedtime.      . mirtazapine (REMERON) 15 MG tablet Take 15 mg by mouth every morning.      . pravastatin (PRAVACHOL) 20 MG tablet Take 20 mg by mouth at bedtime.      . sertraline (ZOLOFT) 50 MG tablet Take 50 mg by mouth daily.      . Vitamin D, Ergocalciferol, (DRISDOL) 50000 UNITS CAPS capsule Take 50,000 Units by mouth every 7 (seven) days.      Marland Kitchen warfarin (COUMADIN) 1 MG tablet Take 0.5-1 mg by mouth daily. Take 0.5 mg on Monday and Friday along with 6mg  tablet to equal 6.5mg .  Take 1mg  tablet along with 6 mg tablet to equal 7mg  on all other days of the week.      . warfarin (COUMADIN) 6 MG tablet Take 6 mg by mouth daily. Take 0.5 mg on Monday and Friday along with 6mg  tablet  to equal 6.5mg .  Take 1mg  tablet along with 6 mg tablet to equal 7mg  on all other days of the week.       Assessment: 67 yom on chronic coumadin for afib presented to the hospital after a fall. Now s/p ORIF of R hip 12/19. Coumadin restarted 12/19. Lovenox prophylaxis started 12/20 discontinued today now that INR is > 1.8 per lovenox order comments.    INR is therapeutic today at 2.85. However, this was a large increase in INR from yesterday (1.37). Patient has received only a total two doses of coumadin which were only slightly higher than patient's PTA dose of 6.5 mg MF, 7 mg all other days. He is not on any medications that are suggestive of drug interactions with coumadin. His inpatient diet being different from his home diet could play a role. Due to the large increase in INR, need for a transfusion yesterday, and the fact that the patient appears to be a fast-responder to the coumadin, dosing will be very conservative today. Holding a dose completely may cause his INR to drop too  low; therefore, patient will receive a low dose of coumadin tonight.  H/H improved 10.2/30.6 after receiving 2 units PRBC (acute blood loss anemia thought to be due to being post-op). His platelets are chronically low at 71. Patient is POD #2. No overt bleeding noted per RN.   Goal of Therapy:  INR 2-3   Plan:  1. Warfarin 1mg  PO x 1 tonight 2. Daily INR 3. Monitor signs of bleeding.  Jawon Dipiero C. Amal Saiki, PharmD Clinical Pharmacist-Resident Pager: [phone] Pharmacy: [phone] 05/30/2013 8:40 AM

## 2013-05-30 NOTE — Clinical Social Work Psychosocial (Signed)
Clinical Social Work Department BRIEF PSYCHOSOCIAL ASSESSMENT 05/30/2013  Patient:  [name], [name]     Account Number:  [id number]     Admit date:  05/27/2013  Clinical Social Worker:  Lavell Luster  Date/Time:  05/30/2013 02:38 PM  Referred by:  Physician  Date Referred:  05/30/2013 Referred for  SNF Placement   Other Referral:   Interview type:  Patient Other interview type:   Patient alert and oriented at time of assessment.    PSYCHOSOCIAL DATA Living Status:  FACILITY Admitted from facility:  [facility] Level of care:  Assisted Living Primary support name:  NONE Primary support relationship to patient:   Degree of support available:   Support is poor. Patient states that he does not have friends or family.    CURRENT CONCERNS Current Concerns  Post-Acute Placement   Other Concerns:    SOCIAL WORK ASSESSMENT / PLAN CSW met with patient at bedside to discuss recommendation for SNF. Patient is agreeable to SNF search and placement for rehab. Patient states that he lives at [facility] and plans to return after rehab. Patient reports that he does not have any social supports at this time. Patient does not have a preference for SNF at this time.   Assessment/plan status:  Psychosocial Support/Ongoing Assessment of Needs Other assessment/ plan:   Complete FL2, Fax, PASRR   Information/referral to community resources:   CSW contact information and SNF list given to patient.    PATIENT'S/FAMILY'S RESPONSE TO PLAN OF CARE: Patient agreeable to SNF placement in [facility]. Patient was pleasant, appropriate, and appreciative of CSW contact. Patient awaits bed offers. CSW will follow.       Roddie Mc, [place], [place], [phone]

## 2013-05-31 ENCOUNTER — Non-Acute Institutional Stay (SKILLED_NURSING_FACILITY): Payer: Medicare Other | Admitting: Internal Medicine

## 2013-05-31 DIAGNOSIS — I4891 Unspecified atrial fibrillation: Secondary | ICD-10-CM

## 2013-05-31 DIAGNOSIS — E46 Unspecified protein-calorie malnutrition: Secondary | ICD-10-CM

## 2013-05-31 DIAGNOSIS — F321 Major depressive disorder, single episode, moderate: Secondary | ICD-10-CM

## 2013-05-31 DIAGNOSIS — D696 Thrombocytopenia, unspecified: Secondary | ICD-10-CM

## 2013-05-31 DIAGNOSIS — F489 Nonpsychotic mental disorder, unspecified: Secondary | ICD-10-CM

## 2013-05-31 DIAGNOSIS — S72141A Displaced intertrochanteric fracture of right femur, initial encounter for closed fracture: Secondary | ICD-10-CM

## 2013-05-31 DIAGNOSIS — S72143A Displaced intertrochanteric fracture of unspecified femur, initial encounter for closed fracture: Secondary | ICD-10-CM

## 2013-05-31 DIAGNOSIS — I442 Atrioventricular block, complete: Secondary | ICD-10-CM

## 2013-05-31 DIAGNOSIS — N189 Chronic kidney disease, unspecified: Secondary | ICD-10-CM

## 2013-05-31 LAB — CBC
MCH: 31.8 pg (ref 26.0–34.0)
MCHC: 33 g/dL (ref 30.0–36.0)
MCV: 96.4 fL (ref 78.0–100.0)
Platelets: 90 10*3/uL — ABNORMAL LOW (ref 150–400)
RBC: 3.3 MIL/uL — ABNORMAL LOW (ref 4.22–5.81)
RDW: 13.9 % (ref 11.5–15.5)

## 2013-05-31 LAB — PROTIME-INR
INR: 5.15 (ref 0.00–1.49)
INR: 3.69 — ABNORMAL HIGH (ref 0.00–1.49)
Prothrombin Time: 45.4 seconds — ABNORMAL HIGH (ref 11.6–15.2)

## 2013-05-31 MED ORDER — WARFARIN SODIUM 6 MG PO TABS: 6.0000 mg | ORAL_TABLET | Freq: Every day | ORAL | Status: DC

## 2013-05-31 MED ORDER — WARFARIN SODIUM 1 MG PO TABS: 0.5000 mg | ORAL_TABLET | Freq: Every day | ORAL | Status: DC

## 2013-05-31 MED ORDER — HYDROCODONE-ACETAMINOPHEN 5-325 MG PO TABS: 2.0000 | ORAL_TABLET | ORAL | Status: DC | PRN

## 2013-05-31 NOTE — Discharge Summary (Signed)
Name: [name] MRN: [id number] DOB: 01-16-1946 67 y.o. PCP: No primary provider on file.  Date of Admission: 05/27/2013  9:53 AM Date of Discharge: 05/31/2013 Attending Physician: Farley Ly, MD  Discharge Diagnosis: Principal Problem:   Closed comminuted intertrochanteric fracture of proximal end of right femur Active Problems:   MALNUTRITION, PROTEIN-CALORIE   THROMBOCYTOPENIA NOS   DEPRESSION, MAJOR, MODERATE   PSYCHIATRIC DISORDER   DISEASE, ISCHEMIC HEART, CHRONIC NOS   CARDIOMYOPATHY   BLOCK, AV, COMPLETE   ATHEROSCLEROSIS, AORTIC   RENAL INSUFFICIENCY, ACUTE   Atrial fibrillation   Fracture femur, cervicotrochanteric  Discharge Medications:   Medication List         alendronate 35 MG tablet  Commonly known as:  FOSAMAX  Take 35 mg by mouth every 7 (seven) days. Take with a full glass of water on an empty stomach.     aspirin 81 MG chewable tablet  Chew 81 mg by mouth daily.     CALTRATE 600+D PLUS MINERALS 600-800 MG-UNIT Tabs  Take 1 tablet by mouth daily.     carvedilol 12.5 MG tablet  Commonly known as:  COREG  Take 12.5 mg by mouth 2 (two) times daily with a meal.     colchicine 0.6 MG tablet  Take 0.6 mg by mouth every 6 (six) hours as needed (pain).     docusate sodium 100 MG capsule  Commonly known as:  COLACE  Take 1 capsule (100 mg total) by mouth 2 (two) times daily. Continue this while taking narcotics to help with bowel movements     doxazosin 4 MG tablet  Commonly known as:  CARDURA  Take 4 mg by mouth every morning.     finasteride 5 MG tablet  Commonly known as:  PROSCAR  Take 5 mg by mouth every morning.     HYDROcodone-acetaminophen 5-325 MG per tablet  Commonly known as:  NORCO  Take 2 tablets by mouth every 4 (four) hours as needed.     lisinopril 5 MG tablet  Commonly known as:  PRINIVIL,ZESTRIL  Take 5 mg by mouth every morning.     magnesium oxide 400 MG tablet  Commonly known as:  MAG-OX  Take 400 mg by  mouth 2 (two) times daily.     Melatonin 1 MG Tabs  Take 1 mg by mouth at bedtime.     mirtazapine 15 MG tablet  Commonly known as:  REMERON  Take 15 mg by mouth every morning.     pravastatin 20 MG tablet  Commonly known as:  PRAVACHOL  Take 20 mg by mouth at bedtime.     sertraline 50 MG tablet  Commonly known as:  ZOLOFT  Take 50 mg by mouth daily.     Vitamin D (Ergocalciferol) 50000 UNITS Caps capsule  Commonly known as:  DRISDOL  Take 50,000 Units by mouth every 7 (seven) days.     warfarin 6 MG tablet  Commonly known as:  COUMADIN  Take 1 tablet (6 mg total) by mouth daily. Take 0.5 mg on Monday and Friday along with 6mg  tablet to equal 6.5mg .  Take 1mg  tablet along with 6 mg tablet to equal 7mg  on all other days of the week.  Start taking on:  06/03/2013     warfarin 1 MG tablet  Commonly known as:  COUMADIN  Take 0.5-1 tablets (0.5-1 mg total) by mouth daily. Take 0.5 mg on Monday and Friday along with 6mg  tablet to equal 6.5mg .  Take  1mg  tablet along with 6 mg tablet to equal 7mg  on all other days of the week.  Start taking on:  06/03/2013        Disposition and follow-up:   Mr.Jamon R Shellenbarger was discharged from [facility] in Stable condition.  At the hospital follow up visit please address:  1.  Appropriate coumadin dosing with daily INR checks, any signs of bleeding, follow up with Dr. Eulah Pont (orthopedic surgeon)--please call within 1-2 weeks to schedule an appointment   2.  Labs / imaging needed at time of follow-up: PT/INR, CBC   3.  Pending labs/ test needing follow-up: None  Follow-up Appointments: Follow-up Information   Call Margarita Rana, D, MD. (please make an appointment within 1-2 weeks)    Specialty:  Orthopedic Surgery   Contact information:   [address] [place] [address] [phone]       Call Pcp Not In System.      Discharge Instructions: Discharge Orders   Future Orders Complete By  Expires   Call MD for:  difficulty breathing, headache or visual disturbances  As directed    Call MD for:  persistant nausea and vomiting  As directed    Call MD for:  redness, tenderness, or signs of infection (pain, swelling, redness, odor or green/yellow discharge around incision site)  As directed    Call MD for:  severe uncontrolled pain  As directed    Call MD for:  temperature >100.4  As directed    Diet - low sodium heart healthy  As directed    Increase activity slowly  As directed    Weight bearing as tolerated  As directed    Questions:     Laterality:     Extremity:        Consultations: Treatment Team:  Sheral Apley, MD  Procedures Performed:  Dg Chest 1 View  05/27/2013   CLINICAL DATA:  Fall.  Hip pain.  Preoperative chest radiograph.  EXAM: CHEST - 1 VIEW  COMPARISON:  09/30/2007.  FINDINGS: Dual lead right subclavian pacemaker. The cardiopericardial silhouette is within normal limits for projection. Cholecystectomy clips are present in the right upper quadrant. No airspace disease. No effusion. No displaced rib fractures in this patient with a history of fall.  IMPRESSION: No active cardiopulmonary disease.   Electronically Signed   By: Andreas Newport M.D.   On: 05/27/2013 11:29   Dg Hip Complete Left  05/27/2013   CLINICAL DATA:  Fall.  Right hip pain.  EXAM: LEFT HIP - COMPLETE 2+ VIEW  COMPARISON:  None.  FINDINGS: There is a comminuted intertrochanteric right hip fracture with sub trochanteric extension into the medial aspect of the proximal shaft. Diffuse osteopenia. The femoral head remains located. The obturator rings appear intact. Fracture fragments are minimally displaced.  IMPRESSION: Comminuted intertrochanteric and subtrochanteric right femur fracture.   Electronically Signed   By: Andreas Newport M.D.   On: 05/27/2013 11:28   Dg Hip Operative Right  05/28/2013   CLINICAL DATA:  Right femoral intertrochanteric fracture.  EXAM: DG OPERATIVE RIGHT HIP   TECHNIQUE: Spot fluoroscopic images of the right hip were submitted.  COMPARISON:  05/27/2013  FINDINGS: Again noted is a displaced right femoral intertrochanteric fracture. Intramedullary nail placement within the right femur. There is a hip screw extending through the right femoral head and neck. The intramedullary nail extends into the distal femur with a distal interlocking screw.  IMPRESSION: Internal fixation of the  displaced right femoral intertrochanteric fracture.   Electronically Signed   By: Richarda Overlie M.D.   On: 05/28/2013 08:56   Dg Pelvis Portable  05/28/2013   CLINICAL DATA:  Right trochanteric fracture repair  EXAM: PORTABLE PELVIS 1-2 VIEWS  COMPARISON:  None.  FINDINGS: There is a minimally displaced right intertrochanteric fracture transfixed with a intra medullary nail and a cannulated femoral neck screw without hardware failure or complication. The distal aspect of the intra medullary nail is excluded from the field of view. There is no hip dislocation. There are postsurgical changes overlying the right hip.  The left hip is unremarkable.  IMPRESSION: ORIF right intertrochanteric fracture.   Electronically Signed   By: Elige Ko   On: 05/28/2013 09:10   Dg Femur Right Port  05/28/2013   CLINICAL DATA:  Right intertrochanteric fracture  EXAM: PORTABLE RIGHT FEMUR - 2 VIEW  COMPARISON:  None.  FINDINGS: Minimally displaced right intertrochanteric fracture transfixed with a right intra medullary nail and interlocking cannulated femoral neck screw. There is no dislocation. Postsurgical changes are noted over the right hip and distal right femur. There is no hardware failure or complication.  IMPRESSION: ORIF right intertrochanteric fracture.   Electronically Signed   By: Elige Ko   On: 05/28/2013 09:13    2D Echo: N/A  Cardiac Cath: N/A  Admission HPI: Pt is a 67 yo male with a PMH significant for major depression, atrial fibrillation, complete AV block (s/p pacemaker  placement), previous homelessness, and thrombocytopenia who presents from [facility] assisted living facility after a fall on 05/27/2013 at 8:30 AM. Pt is a rather poor historian and therefore the history was limited. Patient describes an episode where he slipped on some water that was on the floor and fell. He reports falling backwards and landed on his right back side. He denies hitting his head or any other injuries. He denies any h/o seizure disorder and was not observed to exhibit any seizure-like activity. He denies any LOC and remembers the events leading up to the fall and thereafter. He has no h/o frequent falls. He denies any associated dizziness, lightheadedness, CP, diaphoresis, weakness, SOB, or palpitations. Pt was in severe pain immediately and unable to get off the floor and EMS was called. XR revealed a comminuted intertrochanteric and subtrochanteric right femur fracture. He is a current smoker and smokes 4-5 cigarettes/day since he was 67 yrs old. He reports previous using alcohol but quit 30 yrs ago. He denies any recreational drug use.  During evaluation in the ED, the patient described his pain as constant, and a 7/10 which had improved from a 10/10 with 3 doses of IV Dilaudid 1 mg.   Pt is divorced and has no family. According to chart records he completed 2nd grade and has an appointed legal guardian.  Hospital Course by problem list: Principal Problem:   Closed comminuted intertrochanteric fracture of proximal end of right femur Active Problems:   MALNUTRITION, PROTEIN-CALORIE   THROMBOCYTOPENIA NOS   DEPRESSION, MAJOR, MODERATE   PSYCHIATRIC DISORDER   DISEASE, ISCHEMIC HEART, CHRONIC NOS   CARDIOMYOPATHY   BLOCK, AV, COMPLETE   ATHEROSCLEROSIS, AORTIC   RENAL INSUFFICIENCY, ACUTE   Atrial fibrillation   Fracture femur, cervicotrochanteric   # Fracture of the right femur: S/p open reduction of the hip. Pt with mechanical fall on 05/27/2013 at 8:30 AM. XR revealed  comminuted intertrochanteric and subtrochanteric right femur fracture. Physical exam does not reveal neurovascular compromise of the right  lower extremity. Pt is a smoker and is on fosamax and calcium w/VitD although osteopenia/osteoporosis is not listed as part of his PMH. No BMD/DEXA report. Also, pt has a h/o hypomagnesium and takes magnesium supplementation. Pt was admitted to the IM teaching service and placed on telemetry. Pt continued to c/o pain. Orthopedics (Dr. Eulah Pont) was consulted from the ED and pt had open reduction of the right hip this AM. He was given 2 units of prbcs due to intraoperative bleeding. He continues to c/o pain but denies any other CP, SOB, fever/chills, or N/V. He had frequent neurovascular checks for compartment syndrome which were negative. His pain was somewhat controlled with 2mg  morphine IV q2hrs PRN and norco 1-2 tabs q6 hrs PRN. He was put on IV Cefazolin per surgery for 1 day. PT has been working with him and recommends a rolling walker with 5" wheels and SNF placement. CSW has found a bed at a SNF and he is stable for d/c today.   # Atrial Fibrillation: Stable. Pt reports he gets frequent INR checks from his nursing home. On home Coreg 12.5 mg twice daily for rate control.  Echocardiogram on 10/26/2006 revealed ejection fraction of 55-65%. No other abnormalities were detected. EKG with atrial fibrillation. We placed on telemetry.  We held coumadin pre-op; surgery began lovenox on 12/20 and coumadin on 12/19. We continued coreg 12.5mg  bid for rate control. His goal INR is 2-3.  Initially upon admission, his INR was sub-therapeutic at 1.06.  It increased to 3.69 today.  He received 7.5mg  on 12/19, 7.5mg  on 12/20, and 1mg  on 12/21.  He will need close follow-up of his INR and should have it rechecked tomorrow on Tuesday, 05/31/13. He will need daily INR checks and monitored for signs of bleeding.  Per pharmacy's recommendations, his coumadin dose should be held today, Monday,  05/31/13.    Lab Results   Component  Value  Date    INR  3.69*  05/31/2013    INR  5.15*  05/31/2013    INR  2.85*  05/30/2013    # Major depression: Stable. Continued with home Remeron and Zoloft.   # Block AV complete: Pt with pacemaker in place; followed by [facility] previously. Pt states it is checked about every 3 months.   # Chronic thrombocytopenia: Platelet count 109 compared to 88 in 2009. No active bleeding. We monitored CBC which remained stable.   Lab Results   Component  Value  Date    PLT  90*  05/31/2013     Discharge Vitals:   BP 96/54  Pulse 79  Temp(Src) 98.8 F (37.1 C) (Oral)  Resp 16  Ht 5\' 9"  (1.753 m)  Wt 50.077 kg (110 lb 6.4 oz)  BMI 16.30 kg/m2  SpO2 98%  Discharge Labs:  Results for orders placed during the hospital encounter of 05/27/13 (from the past 24 hour(s))  CBC     Status: Abnormal   Collection Time    05/31/13  5:40 AM      Result Value Range   WBC 6.7  4.0 - 10.5 K/uL   RBC 3.30 (*) 4.22 - 5.81 MIL/uL   Hemoglobin 10.5 (*) 13.0 - 17.0 g/dL   HCT 16.1 (*) 09.6 - 04.5 %   MCV 96.4  78.0 - 100.0 fL   MCH 31.8  26.0 - 34.0 pg   MCHC 33.0  30.0 - 36.0 g/dL   RDW 40.9  81.1 - 91.4 %   Platelets 90 (*)  150 - 400 K/uL  PROTIME-INR     Status: Abnormal   Collection Time    05/31/13  5:40 AM      Result Value Range   Prothrombin Time 45.4 (*) 11.6 - 15.2 seconds   INR 5.15 (*) 0.00 - 1.49  PROTIME-INR     Status: Abnormal   Collection Time    05/31/13  8:38 AM      Result Value Range   Prothrombin Time 35.2 (*) 11.6 - 15.2 seconds   INR 3.69 (*) 0.00 - 1.49    Signed: Boykin Peek, MD 05/31/2013, 2:09 PM   Time Spent on Discharge: 55 minutes Services Ordered on Discharge: PT Equipment Ordered on Discharge: Dan Humphreys

## 2013-05-31 NOTE — Progress Notes (Signed)
Physical Therapy Treatment Patient Details Name: [name] MRN: [id number] DOB: 12/30/1945 Today's Date: 05/31/2013 Time: 0201-0226 PT Time Calculation (min): 25 min  PT Assessment / Plan / Recommendation  History of Present Illness Pt adm s/p fall at ALF (slipped in bathroom on water). Pt previously homeless. PMHx includes psych d/o, depression, cardiomyopathy, afib, PPM.   PT Comments   Pt agreeable to OOB today with encouragement.  Pt with good upper body strength and maintaining NWB on R LE despite being WBAT.  Plan is to go to SNF possibly today.  Follow Up Recommendations  SNF     Does the patient have the potential to tolerate intense rehabilitation     Barriers to Discharge        Equipment Recommendations  Rolling walker with 5" wheels    Recommendations for Other Services    Frequency Min 3X/week   Progress towards PT Goals Progress towards PT goals: Progressing toward goals  Plan Current plan remains appropriate    Precautions / Restrictions Precautions Precautions: Fall Restrictions RLE Weight Bearing: Weight bearing as tolerated   Pertinent Vitals/Pain FACES 7/10, not time for pain meds.    Mobility  Bed Mobility Bed Mobility: Supine to Sit Supine to Sit: 4: Min assist;Other (comment) (A for R LE) Sitting - Scoot to Edge of Bed: 5: Supervision Transfers Transfers: Sit to Stand Sit to Stand: 1: +2 Total assist Sit to Stand: Patient Percentage: 50% Stand Pivot Transfers: 4: Min assist Details for Transfer Assistance: Pt needing +2 A to stand with heavy encouragement. SPT with use of RW.  Pt maintaining NWB on R LE although he is WBAT.  Good upper body strength.    Exercises General Exercises - Lower Extremity Ankle Circles/Pumps: AROM;Both;10 reps;Supine Quad Sets: AROM;Both;10 reps;Supine Gluteal Sets: Strengthening;Both;Supine;10 reps Heel Slides: AAROM;Right;10 reps;Supine Hip ABduction/ADduction: AAROM;Right;10 reps;Supine   PT Diagnosis:     PT Problem List:   PT Treatment Interventions:     PT Goals (current goals can now be found in the care plan section)    Visit Information  Last PT Received On: 05/31/13 Assistance Needed: +2 History of Present Illness: Pt adm s/p fall at ALF (slipped in bathroom on water). Pt previously homeless. PMHx includes psych d/o, depression, cardiomyopathy, afib, PPM.    Subjective Data  Subjective: I don't think I can do it.   Cognition  Cognition Arousal/Alertness: Awake/alert Behavior During Therapy: Anxious Overall Cognitive Status: No family/caregiver present to determine baseline cognitive functioning    Balance     End of Session PT - End of Session Equipment Utilized During Treatment: Gait belt Activity Tolerance: Patient limited by pain Patient left: in chair;with chair alarm set Nurse Communication: Mobility status   GP     [name] LUBECK 05/31/2013, 2:40 PM

## 2013-05-31 NOTE — Progress Notes (Signed)
Patient ID: [name], male   DOB: 04/11/1946, 67 y.o.   MRN: [id number]     Subjective:  Patient reports pain as mild to moderate.  Patient aware of time and place.  Reports that he is doing better  Objective:   VITALS:   Filed Vitals:   05/30/13 1731 05/30/13 2000 05/30/13 2130 05/31/13 0547  BP: 147/75  108/69 119/68  Pulse: 94  72 82  Temp:   98.6 F (37 C) 98.9 F (37.2 C)  TempSrc:   Oral Oral  Resp:  18 18 18   Height:      SpO2:  95% 95% 98%    ABD soft Sensation intact distally Dorsiflexion/Plantar flexion intact Incision: dressing C/D/I and no drainage   Lab Results  Component Value Date   WBC 6.7 05/31/2013   HGB 10.5* 05/31/2013   HCT 31.8* 05/31/2013   MCV 96.4 05/31/2013   PLT 90* 05/31/2013     Assessment/Plan: 3 Days Post-Op   Principal Problem:   Closed comminuted intertrochanteric fracture of proximal end of right femur Active Problems:   MALNUTRITION, PROTEIN-CALORIE   THROMBOCYTOPENIA NOS   DEPRESSION, MAJOR, MODERATE   PSYCHIATRIC DISORDER   DISEASE, ISCHEMIC HEART, CHRONIC NOS   CARDIOMYOPATHY   BLOCK, AV, COMPLETE   ATHEROSCLEROSIS, AORTIC   RENAL INSUFFICIENCY, ACUTE   Atrial fibrillation   Fracture femur, cervicotrochanteric   Advance diet Up with therapy WBAT right lower ext. Dry dressing PRN   Haskel Khan 05/31/2013, 9:34 AM   Margarita Rana MD [phone]

## 2013-05-31 NOTE — Progress Notes (Signed)
ANTICOAGULATION CONSULT NOTE   Pharmacy Consult for warfarin Indication: atrial fibrillation and VTE prophylaxis  No Known Allergies  Patient Measurements: Height: 5\' 9"  (175.3 cm) IBW/kg (Calculated) : 70.7   Vital Signs: Temp: 98.9 F (37.2 C) (12/22 0547) Temp src: Oral (12/22 0547) BP: 119/68 mmHg (12/22 0547) Pulse Rate: 82 (12/22 0547)  Labs:  Recent Labs  05/29/13 0459 05/30/13 0455 05/31/13 0540  HGB 7.7* 10.2* 10.5*  HCT 23.0* 30.6* 31.8*  PLT 74* 71* 90*  LABPROT 16.5* 28.9* 45.4*  INR 1.37 2.85* 5.15*  CREATININE 0.65 0.70  --     The CrCl is unknown because both a height and weight (above a minimum accepted value) are required for this calculation.   Assessment: [name] on chronic coumadin for afib presented to the hospital after a fall. Now s/p ORIF of R hip 12/19. Coumadin restarted 12/19. Lovenox prophylaxis started 12/20 discontinued today now that INR is > 1.8 per lovenox order comments.    INR continued to rise overnight and exceeding high rate to 5.1 this morning after 1mg  last night. Will hold dosing for now until coagulapathy resolves.   H/H is stable after transfusions, plt continue to improve. No overt bleeding noted.  Goal of Therapy:  INR 2-3   Plan:  1. Hold Warfarin  2. Daily INR 3. Monitor signs of bleeding.  Sheppard Coil PharmD., BCPS Clinical Pharmacist Pager [phone] 05/31/2013 8:36 AM

## 2013-05-31 NOTE — Progress Notes (Signed)
Internal Medicine Attending  Date: 05/31/2013  Patient name: [name] Medical record number: [id number] Date of birth: 06-Dec-1945 Age: 67 y.o. Gender: male  I saw and evaluated the patient and discussed his care on a.m. rounds with house staff. I reviewed the resident's note by Dr. Delane Ginger and I agree with the resident's findings and plans as documented in her note, with the following additional comments.  Patient reports some right hip pain, but otherwise is doing well without complaints. His hemoglobin today is stable at 10.5.  Anticipate discharge to skilled nursing facility later today.  Patient's INR is over-therapeutic today; he will need daily INR checks and appropriate warfarin adjustment at the skilled nursing facility until his dosing regimen is stable.

## 2013-05-31 NOTE — Progress Notes (Signed)
DC orders received.  Patient stable with no S/S of distress.  Medication and discharge information reviewed with patient.  Awaiting transport to SNF via ambulance. [place], Mitzi Hansen

## 2013-05-31 NOTE — Progress Notes (Addendum)
Subjective    Pt is s/p open reduction of right hip.  He rates his pain 6/10.  No other complaints today.   Objective:    Vital signs in last 24 hours: Filed Vitals:   05/30/13 2130 05/31/13 0547 05/31/13 0800 05/31/13 1051  BP: 108/69 119/68    Pulse: 72 82    Temp: 98.6 F (37 C) 98.9 F (37.2 C)    TempSrc: Oral Oral    Resp: 18 18 16    Height:      Weight:    50.077 kg (110 lb 6.4 oz)  SpO2: 95% 98% 98%    Weight change:   Intake/Output Summary (Last 24 hours) at 05/31/13 1314 Last data filed at 05/31/13 0548  Gross per 24 hour  Intake      0 ml  Output    400 ml  Net   -400 ml    Physical Exam: Constitutional: Vital signs reviewed.  Patient is chronically ill appearing lying in bed in NAD.     Head: Normocephalic and atraumatic Eyes: PERRL, EOMI, conjunctivae normal, no scleral icterus  Neck: Supple, Trachea midline Cardiovascular: RRR, S1, S2 present, no MRG, DP 2+ b/l Pulmonary/Chest: normal respiratory effort, CTAB, no wheezes, rales, or rhonchi Abdominal: Soft. +BS, Non-tender, non-distended Musculoskeletal: R hip s/p open reduction, L hip wnl Neurological: A&O x3, cranial nerve II-XII are grossly intact, moving all extremities  Skin: Cool extremities (b/l below both knees), dry and intact. No rash. RN obtained DP pulses with doppler.   Lab Results:  BMP:  Recent Labs Lab 05/27/13 1034 05/27/13 2040 05/28/13 0704 05/29/13 0459 05/30/13 0455  NA 137  --  135 129* 137  K 4.0  --  3.7 4.3 4.1  CL 103  --  100 97 101  CO2 29  --  27 23 29   GLUCOSE 103*  --  132* 123* 98  BUN 11  --  10 10 9   CREATININE 0.72  --  0.64 0.65 0.70  CALCIUM 9.1  --  9.0 8.3* 8.4  MG  --   --  1.7  --   --   PHOS  --  2.9  --   --   --     CBC:  Recent Labs Lab 05/27/13 1034  05/30/13 0455 05/31/13 0540  WBC 5.5  < > 6.0 6.7  NEUTROABS 3.4  --   --   --   HGB 12.6*  < > 10.2* 10.5*  HCT 38.6*  < > 30.6* 31.8*  MCV 98.2  < > 95.0 96.4  PLT 109*  < >  71* 90*  < > = values in this interval not displayed.  Coagulation:  Recent Labs Lab 05/29/13 0459 05/30/13 0455 05/31/13 0540 05/31/13 0838  LABPROT 16.5* 28.9* 45.4* 35.2*  INR 1.37 2.85* 5.15* 3.69*    CBG:            Recent Labs Lab 05/27/13 2135 05/28/13 0010 05/28/13 0639 05/28/13 0913 05/28/13 1139  GLUCAP 137* 137* 134* 122* 126*          LFTs:  Recent Labs Lab 05/28/13 0704  AST 31  ALT 21  ALKPHOS 121*  BILITOT 0.9  PROT 7.1  ALBUMIN 3.6   EKG:  Date/Time:  Thursday May 27 2013 10:09:07 EST Ventricular Rate:  92 PR Interval:    QRS Duration: 99 QT Interval:  394 QTC Calculation: 487 R Axis:   114 Text Interpretation:  Atrial fibrillation Right axis deviation Borderline  prolonged QT interval Baseline wander in lead(s) V6 Confirmed by Bebe Shaggy  MD, DONALD [phone]) on 05/27/2013 10:11:59 AM   Urinalysis:  Recent Labs Lab 05/27/13 1547  COLORURINE YELLOW  LABSPEC 1.017  PHURINE 5.0  GLUCOSEU NEGATIVE  HGBUR NEGATIVE  BILIRUBINUR NEGATIVE  KETONESUR NEGATIVE  PROTEINUR NEGATIVE  UROBILINOGEN 0.2  NITRITE NEGATIVE  LEUKOCYTESUR NEGATIVE    Micro Results: Recent Results (from the past 240 hour(s))  SURGICAL PCR SCREEN     Status: None   Collection Time    05/28/13  2:44 AM      Result Value Range Status   MRSA, PCR NEGATIVE  NEGATIVE Final   Staphylococcus aureus NEGATIVE  NEGATIVE Final   Comment:            The Xpert SA Assay (FDA     approved for NASAL specimens     in patients over 53 years of age),     is one component of     a comprehensive surveillance     program.  Test performance has     been validated by [employer] for patients greater     than or equal to 52 year old.     It is not intended     to diagnose infection nor to     guide or monitor treatment.    Medications:  Scheduled Meds: . carvedilol  12.5 mg Oral BID WC  . HYDROcodone-acetaminophen  2 tablet Oral Q6H  . mirtazapine  15 mg Oral BH-q7a   . sertraline  50 mg Oral Daily  . Vitamin D (Ergocalciferol)  50,000 Units Oral Q7 days  . Warfarin - Pharmacist Dosing Inpatient   Does not apply q1800   Continuous Infusions:   PRN Meds:.menthol-cetylpyridinium, metoCLOPramide (REGLAN) injection, metoCLOPramide, morphine injection, ondansetron (ZOFRAN) IV, ondansetron, phenol  Antibiotics: Anti-infectives   Start     Dose/Rate Route Frequency Ordered Stop   05/28/13 1400  ceFAZolin (ANCEF) IVPB 2 g/50 mL premix     2 g 100 mL/hr over 30 Minutes Intravenous Every 6 hours 05/28/13 1013 05/28/13 2033   05/28/13 0600  [MAR Hold]  ceFAZolin (ANCEF) 2 g in dextrose 5 % 50 mL IVPB     (On MAR Hold since 05/28/13 0629)   2 g 140 mL/hr over 30 Minutes Intravenous On call to O.R. 05/27/13 1439 05/28/13 0735     Antibiotics Given (last 72 hours)   Date/Time Action Medication Dose Rate   05/28/13 1615 Given   ceFAZolin (ANCEF) IVPB 2 g/50 mL premix 2 g 100 mL/hr   05/28/13 2003 Given   ceFAZolin (ANCEF) IVPB 2 g/50 mL premix 2 g 100 mL/hr     Day of Hospitalization:  4 Consults: Treatment Team:  Sheral Apley, MD  Assessment/Plan: Principal Problem:   Closed comminuted intertrochanteric fracture of proximal end of right femur Active Problems:   MALNUTRITION, PROTEIN-CALORIE   THROMBOCYTOPENIA NOS   DEPRESSION, MAJOR, MODERATE   PSYCHIATRIC DISORDER   DISEASE, ISCHEMIC HEART, CHRONIC NOS   CARDIOMYOPATHY   BLOCK, AV, COMPLETE   ATHEROSCLEROSIS, AORTIC   RENAL INSUFFICIENCY, ACUTE   Atrial fibrillation   Fracture femur, cervicotrochanteric  # Fracture of the right femur: S/p open reduction of the hip.  Pt with mechanical fall on 05/27/2013 at 8:30 AM. XR revealed comminuted intertrochanteric and subtrochanteric right femur fracture. Physical exam does not reveal neurovascular compromise of the right lower extremity. Pt is a smoker and is on fosamax and calcium  w/VitD although osteopenia/osteoporosis is not listed as part of  his PMH. No BMD/DEXA report. Also, pt has a h/o hypomagnesium and takes magnesium supplementation. Pt was admitted to the IM teaching service and placed on telemetry.  Pt continued to c/o  pain. Orthopedics (Dr. Eulah Pont) was consulted from the ED and pt had open reduction of the right hip this AM.   He was given 2 units of prbcs due to intraoperative bleeding.  He continues to c/o pain but denies any other CP, SOB, fever/chills, or N/V.  He had frequent neurovascular checks for compartment syndrome which were negative.  His pain was somewhat controlled with 2mg  morphine IV q2hrs PRN and norco 1-2 tabs q6 hrs PRN.  He was put on IV Cefazolin per surgery for 1 day.  PT has been working with him and recommends a rolling walker with 5" wheels and SNF placement.  CSW has found a bed at a SNF and he is stable for d/c today.   # Atrial Fibrillation: Stable. Pt reports he gets frequent INR checks from his nursing home.  On home Coreg 12.5 mg twice a day. Echocardiogram on 10/26/2006 revealed ejection fraction of 55-65%. No other abnormalities were detected. EKG with atrial fibrillation.  We placed on telemetry.  We held coumadin pre-op; surgery began lovenox on 12/20 and coumadin on 12/19.  We continued coreg 12.5mg  bid for rate control.  His goal INR is 2-3.  Today, (05/31/13) his INR increased to 5.15, but upon recheck it decreased to 3.69.  He will need close follow-up of his INR and should have it rechecked tomorrow on Tuesday, 05/31/13.  He will need daily INR checks and monitor for signs of bleeding.  Per pharmacy's recommendations, his coumadin dose should be held today, Monday, 05/31/13.  He should then take 3mg  on 06/01/13 and 3mg  on 06/02/13 and resume his home dose on 06/03/13 per pharmacy.    Lab Results  Component Value Date   INR 3.69* 05/31/2013   INR 5.15* 05/31/2013   INR 2.85* 05/30/2013    # Major depression: Stable. Continued with home Remeron and Zoloft.  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 Pt states it is checked about every 3 months.   # Chronic thrombocytopenia: Platelet count 109 compared to 88 in 2009. No active bleeding.  We monitored CBC which remained stable.   Lab Results  Component Value Date   PLT 90* 05/31/2013    #Dispo-  SNF placement has been secured.  Anticipated discharge today.       LOS: 4 days   Boykin Peek, MD 05/31/2013, 1:14 PM

## 2013-05-31 NOTE — Progress Notes (Signed)
Patient is medically stable for D/C to [facility] skilled nursing facility today. Clinical Child psychotherapist (CSW) prepared D/C packet and confirmed patient's bed with [facility] at [place]. CSW arranged non-emegency EMS ([facility]) for transport to [place]. Patient has no contacts listed in Epic and when asked if he had any friends of family he replied "no he didn't have anyone." Nursing is aware of above. Please reconsult if further social work needs arise. CSW signing off.   Jetta Lout, LCSWA Weekend CSW [phone]

## 2013-06-01 ENCOUNTER — Other Ambulatory Visit: Payer: Self-pay | Admitting: *Deleted

## 2013-06-01 ENCOUNTER — Encounter: Payer: Self-pay | Admitting: Nurse Practitioner

## 2013-06-01 ENCOUNTER — Non-Acute Institutional Stay (SKILLED_NURSING_FACILITY): Payer: Medicare Other | Admitting: Nurse Practitioner

## 2013-06-01 DIAGNOSIS — S72143A Displaced intertrochanteric fracture of unspecified femur, initial encounter for closed fracture: Secondary | ICD-10-CM

## 2013-06-01 DIAGNOSIS — I442 Atrioventricular block, complete: Secondary | ICD-10-CM

## 2013-06-01 DIAGNOSIS — I4891 Unspecified atrial fibrillation: Secondary | ICD-10-CM

## 2013-06-01 DIAGNOSIS — D62 Acute posthemorrhagic anemia: Secondary | ICD-10-CM

## 2013-06-01 DIAGNOSIS — E46 Unspecified protein-calorie malnutrition: Secondary | ICD-10-CM

## 2013-06-01 DIAGNOSIS — D696 Thrombocytopenia, unspecified: Secondary | ICD-10-CM

## 2013-06-01 DIAGNOSIS — F321 Major depressive disorder, single episode, moderate: Secondary | ICD-10-CM

## 2013-06-01 DIAGNOSIS — S72141A Displaced intertrochanteric fracture of right femur, initial encounter for closed fracture: Secondary | ICD-10-CM

## 2013-06-01 DIAGNOSIS — N189 Chronic kidney disease, unspecified: Secondary | ICD-10-CM

## 2013-06-01 MED ORDER — HYDROCODONE-ACETAMINOPHEN 5-325 MG PO TABS: ORAL_TABLET | ORAL | Status: DC

## 2013-06-01 NOTE — Progress Notes (Signed)
Patient ID: [name], male   DOB: [birth year], 67 y.o.   MRN: [id number]    Nursing Home Location:  [facility]   Place of Service: SNF (31)  PCP: No primary provider on file.  No Known Allergies  Chief Complaint  Patient presents with  . Hospitalization Follow-up    HPI:  66 year old male who was transferred from the hospital to [facility] after open reduction of the hip after falling backwards and landing on his right side for ongoing rehab and support Nursing without any concern and pt does not have new complaints   Review of Systems:  Review of Systems  Constitutional: Negative for fever, chills and malaise/fatigue.  Respiratory: Positive for cough (chronic; smoker). Negative for shortness of breath.   Cardiovascular: Negative for chest pain.  Gastrointestinal: Negative for heartburn, abdominal pain, diarrhea and constipation.  Genitourinary: Negative for dysuria.  Musculoskeletal: Positive for falls and joint pain. Negative for myalgias.  Skin: Negative for itching and rash.  Neurological: Negative for dizziness, weakness and headaches.  Psychiatric/Behavioral: Negative for depression.     Past Medical History  Diagnosis Date  . Depression   . Hypomagnesemia   . Dyspepsia   . Fall at nursing home 05/27/2013    "slipped on water; said I broke my right hip" (05/27/2013)  . Migraine     "used to have them bad; haven't had one in awhile" (05/27/2013)  . Gout attack     "not too long ago; in my right foot" (05/27/2013)  . Complete heart block     reason for pacemaker/notes 05/27/2013  . Atrial fibrillation     /notes 05/27/2013   Past Surgical History  Procedure Laterality Date  . Insert / replace / remove pacemaker    . Appendectomy    . Cholecystectomy     Social History:   reports that he has been smoking Cigarettes.  He has a 15.25 pack-year smoking history. He has quit using smokeless tobacco. His smokeless tobacco use included Snuff and  Chew. He reports that he drinks alcohol. His drug history is not on file.  No family history on file.  Medications: Patient's Medications  New Prescriptions   No medications on file  Previous Medications   ALENDRONATE (FOSAMAX) 35 MG TABLET    Take 35 mg by mouth every 7 (seven) days. Take with a full glass of water on an empty stomach.   ASPIRIN 81 MG CHEWABLE TABLET    Chew 81 mg by mouth daily.   CALCIUM CARBONATE-VIT D-MIN (CALTRATE 600+D PLUS MINERALS) 600-800 MG-UNIT TABS    Take 1 tablet by mouth daily.   CARVEDILOL (COREG) 12.5 MG TABLET    Take 12.5 mg by mouth 2 (two) times daily with a meal.   COLCHICINE 0.6 MG TABLET    Take 0.6 mg by mouth every 6 (six) hours as needed (pain).   DOCUSATE SODIUM (COLACE) 100 MG CAPSULE    Take 1 capsule (100 mg total) by mouth 2 (two) times daily. Continue this while taking narcotics to help with bowel movements   DOXAZOSIN (CARDURA) 4 MG TABLET    Take 4 mg by mouth every morning.   FINASTERIDE (PROSCAR) 5 MG TABLET    Take 5 mg by mouth every morning.   HYDROCODONE-ACETAMINOPHEN (NORCO) 5-325 MG PER TABLET    Take 2 tablets by mouth every 4 (four) hours as needed.   LISINOPRIL (PRINIVIL,ZESTRIL) 5 MG TABLET    Take 5 mg by mouth  every morning.   MAGNESIUM OXIDE (MAG-OX) 400 MG TABLET    Take 400 mg by mouth 2 (two) times daily.   MELATONIN 1 MG TABS    Take 1 mg by mouth at bedtime.   MIRTAZAPINE (REMERON) 15 MG TABLET    Take 15 mg by mouth every morning.   PRAVASTATIN (PRAVACHOL) 20 MG TABLET    Take 20 mg by mouth at bedtime.   SERTRALINE (ZOLOFT) 50 MG TABLET    Take 50 mg by mouth daily.   VITAMIN D, ERGOCALCIFEROL, (DRISDOL) 50000 UNITS CAPS CAPSULE    Take 50,000 Units by mouth every 7 (seven) days.   WARFARIN (COUMADIN) 1 MG TABLET    Take 0.5-1 tablets (0.5-1 mg total) by mouth daily. Take 0.5 mg on Monday and Friday along with 6mg  tablet to equal 6.5mg .  Take 1mg  tablet along with 6 mg tablet to equal 7mg  on all other days of the  week.   WARFARIN (COUMADIN) 6 MG TABLET    Take 1 tablet (6 mg total) by mouth daily. Take 0.5 mg on Monday and Friday along with 6mg  tablet to equal 6.5mg .  Take 1mg  tablet along with 6 mg tablet to equal 7mg  on all other days of the week.  Modified Medications   No medications on file  Discontinued Medications   No medications on file     Physical Exam: Physical Exam  Constitutional:  Thin male in NAD  HENT:  Mouth/Throat: Oropharynx is clear and moist. No oropharyngeal exudate.  Eyes: Conjunctivae and EOM are normal. Pupils are equal, round, and reactive to light.  Neck: Normal range of motion. Neck supple.  Cardiovascular: Normal rate.   Pulmonary/Chest: Effort normal and breath sounds normal.  Abdominal: Soft. Bowel sounds are normal. He exhibits no distension.  Musculoskeletal: He exhibits no edema and no tenderness.  Neurological: He is alert.  Skin: Skin is warm and dry. No erythema.  Small incision on right hip with stables and dsg intact; no signs of infection  Psychiatric: Affect normal.     Filed Vitals:   06/01/13 0900  BP: 139/74  Pulse: 68  Temp: 98.2 F (36.8 C)  Resp: 20      Labs reviewed: Basic Metabolic Panel:  Recent Labs  16/10/96 1034 05/27/13 2040 05/28/13 0704 05/29/13 0459 05/30/13 0455  NA 137  --  135 129* 137  K 4.0  --  3.7 4.3 4.1  CL 103  --  100 97 101  CO2 29  --  27 23 29   GLUCOSE 103*  --  132* 123* 98  BUN 11  --  10 10 9   CREATININE 0.72  --  0.64 0.65 0.70  CALCIUM 9.1  --  9.0 8.3* 8.4  MG  --   --  1.7  --   --   PHOS  --  2.9  --   --   --    Liver Function Tests:  Recent Labs  05/28/13 0704  AST 31  ALT 21  ALKPHOS 121*  BILITOT 0.9  PROT 7.1  ALBUMIN 3.6   No results found for this basename: LIPASE, AMYLASE,  in the last 8760 hours No results found for this basename: AMMONIA,  in the last 8760 hours CBC:  Recent Labs  05/27/13 1034  05/29/13 0459 05/30/13 0455 05/31/13 0540  WBC 5.5  < > 6.2  6.0 6.7  NEUTROABS 3.4  --   --   --   --   HGB 12.6*  < >  7.7* 10.2* 10.5*  HCT 38.6*  < > 23.0* 30.6* 31.8*  MCV 98.2  < > 95.4 95.0 96.4  PLT 109*  < > 74* 71* 90*  < > = values in this interval not displayed. Cardiac Enzymes: No results found for this basename: CKTOTAL, CKMB, CKMBINDEX, TROPONINI,  in the last 8760 hours BNP: No components found with this basename: POCBNP,  CBG:  Recent Labs  05/28/13 0639 05/28/13 0913 05/28/13 1139  GLUCAP 134* 122* 126*   TSH: No results found for this basename: TSH,  in the last 8760 hours A1C: No results found for this basename: HGBA1C   Lipid Panel: No results found for this basename: CHOL, HDL, LDLCALC, TRIG, CHOLHDL, LDLDIRECT,  in the last 8760 hours  Radiological Exams: Dg Chest 1 View  05/27/2013   CLINICAL DATA:  Fall.  Hip pain.  Preoperative chest radiograph.  EXAM: CHEST - 1 VIEW  COMPARISON:  09/30/2007.  FINDINGS: Dual lead right subclavian pacemaker. The cardiopericardial silhouette is within normal limits for projection. Cholecystectomy clips are present in the right upper quadrant. No airspace disease. No effusion. No displaced rib fractures in this patient with a history of fall.  IMPRESSION: No active cardiopulmonary disease.   Electronically Signed   By: Andreas Newport M.D.   On: 05/27/2013 11:29   Dg Hip Complete Left  05/27/2013   CLINICAL DATA:  Fall.  Right hip pain.  EXAM: LEFT HIP - COMPLETE 2+ VIEW  COMPARISON:  None.  FINDINGS: There is a comminuted intertrochanteric right hip fracture with sub trochanteric extension into the medial aspect of the proximal shaft. Diffuse osteopenia. The femoral head remains located. The obturator rings appear intact. Fracture fragments are minimally displaced.  IMPRESSION: Comminuted intertrochanteric and subtrochanteric right femur fracture.   Electronically Signed   By: Andreas Newport M.D.   On: 05/27/2013 11:28   Dg Hip Operative Right  05/28/2013   CLINICAL DATA:  Right  femoral intertrochanteric fracture.  EXAM: DG OPERATIVE RIGHT HIP  TECHNIQUE: Spot fluoroscopic images of the right hip were submitted.  COMPARISON:  05/27/2013  FINDINGS: Again noted is a displaced right femoral intertrochanteric fracture. Intramedullary nail placement within the right femur. There is a hip screw extending through the right femoral head and neck. The intramedullary nail extends into the distal femur with a distal interlocking screw.  IMPRESSION: Internal fixation of the displaced right femoral intertrochanteric fracture.   Electronically Signed   By: Richarda Overlie M.D.   On: 05/28/2013 08:56   Dg Pelvis Portable  05/28/2013   CLINICAL DATA:  Right trochanteric fracture repair  EXAM: PORTABLE PELVIS 1-2 VIEWS  COMPARISON:  None.  FINDINGS: There is a minimally displaced right intertrochanteric fracture transfixed with a intra medullary nail and a cannulated femoral neck screw without hardware failure or complication. The distal aspect of the intra medullary nail is excluded from the field of view. There is no hip dislocation. There are postsurgical changes overlying the right hip.  The left hip is unremarkable.  IMPRESSION: ORIF right intertrochanteric fracture.   Electronically Signed   By: Elige Ko   On: 05/28/2013 09:10   Dg Femur Right Port  05/28/2013   CLINICAL DATA:  Right intertrochanteric fracture  EXAM: PORTABLE RIGHT FEMUR - 2 VIEW  COMPARISON:  None.  FINDINGS: Minimally displaced right intertrochanteric fracture transfixed with a right intra medullary nail and interlocking cannulated femoral neck screw. There is no dislocation. Postsurgical changes are noted over the right hip and distal right  femur. There is no hardware failure or complication.  IMPRESSION: ORIF right intertrochanteric fracture.   Electronically Signed   By: Elige Ko   On: 05/28/2013 09:13     Assessment/Plan 1. Atrial fibrillation -rate controlled on current medications -not on coumadin currently  due to elevated INR -current INR of 6.8; will having nursing repeat STAT and was 5.4 -pt on bedrest  2. Closed comminuted intertrochanteric fracture of proximal end of right femur S/p open reduction of the hip -conts on fosamax and calcium with D -tolerating pain medication  -pt with foley catheter from surgery? Unsure of why this was still left in, pt reports he was able to void prior, once INR becomes therapeutic will dc foley with nursing to monitor   3. THROMBOCYTOPENIA NOS -chronic -plts remained stable in hospital -will follow up cbc  4. MALNUTRITION, PROTEIN-CALORIE -conts on Remeron for appetite  -dietary consult -encourage PO intake and supplements   5. DEPRESSION, MAJOR, MODERATE -stable on zoloft  6. RENAL INSUFFICIENCY, ACUTE -will follow up BMP  7. hypomagnesemia  -remains on supplement   8. Acute blood loss -required transfusion after surgery -will follow up cbc  9. Block AV complete  Pt with pacemaker in place; followed by  EP

## 2013-06-01 NOTE — Discharge Summary (Signed)
Internal Medicine Attending  Date: 06/01/2013  Patient name: [name] Medical record number: [id number] Date of birth: February 07, 1946 Age: 67 y.o. Gender: male  Addendum: I called patient's physician at [facility] skilled nursing facility, Dr. Merrilee Seashore, this morning and discussed his anticoagulation in detail with her.  Patient was admitted to our service with a hip fracture, and because of a history of atrial fibrillation was on a reported warfarin regimen prior to admission of 6.5 mg on Monday and Friday and 7 mg on all other days of the week.  His INR was normal at the time of admission.  When warfarin was resumed postoperatively, his INR increased to a supratherapeutic level by the day of discharge and we held his warfarin dose on the day of discharge (yesterday 05/31/2013).  Since it is unclear what his warfarin requirement will be, and it is not clear that his prior home dose will be appropriate for him, I recommended that his INR be checked daily and that his warfarin be dosed and adjusted by Dr. Lyn Hollingshead based on his INR, rather than just resuming his prior home dose as listed in the discharge medication list.  Dr. Lyn Hollingshead confirmed that they will monitor his INR daily and dose his warfarin based upon those results.

## 2013-06-04 ENCOUNTER — Encounter: Payer: Self-pay | Admitting: Internal Medicine

## 2013-06-04 NOTE — Progress Notes (Signed)
MRN: [id number] Name: [name]  Sex: male Age: 67 y.o. DOB: Jan 29, 1946  PSC #: [facility] Facility/Room: 120A Level Of Care: SNF Provider: Merrilee Seashore D Emergency Contacts: Extended Emergency Contact Information Primary Emergency Contact: No,Contact  [place] Home Phone: [phone] Relation: None  Code Status:DNR   Allergies: Review of patient's allergies indicates no known allergies.  Chief Complaint  Patient presents with  . nursing home admission    HPI: Patient is 66 y.o. male who had R hip fracture and is here for Ot/PT. Pt also has mental problem , has a state guardian, and needs supervised care forever.  Past Medical History  Diagnosis Date  . Depression   . Hypomagnesemia   . Dyspepsia   . Fall at nursing home 05/27/2013    "slipped on water; said I broke my right hip" (05/27/2013)  . Migraine     "used to have them bad; haven't had one in awhile" (05/27/2013)  . Gout attack     "not too long ago; in my right foot" (05/27/2013)  . Complete heart block     reason for pacemaker/notes 05/27/2013  . Atrial fibrillation     /notes 05/27/2013    Past Surgical History  Procedure Laterality Date  . Insert / replace / remove pacemaker    . Appendectomy    . Cholecystectomy    . Open reduction of hip Right 05/28/2013    Procedure: OPEN REDUCTION OF HIP;  Surgeon: Sheral Apley, MD;  Location: [facility];  Service: Orthopedics;  Laterality: Right;      Medication List    Notice   This visit is during an admission. Changes to the med list made in this visit will be reflected in the After Visit Summary of the admission.      No orders of the defined types were placed in this encounter.     There is no immunization history on file for this patient.  History  Substance Use Topics  . Smoking status: Current Every Day Smoker -- 0.25 packs/day for 61 years    Types: Cigarettes  . Smokeless tobacco: Former Neurosurgeon    Types: Snuff, Chew      Comment: 05/27/2013 "haven't used chew or snuff in ~ 30 yr"  . Alcohol Use: Yes     Comment: 05/27/2013 "none in years; never had problem w/it"    Family history is noncontributory    Review of Systems- UTO sec to dementia    Filed Vitals:   06/04/13 1126  BP: 127/68  Pulse: 75  Temp: 98.6 F (37 C)  Resp: 19    Physical Exam  GENERAL APPEARANCE: Alert, conversant. Appropriately groomed. No acute distress.  SKIN: No diaphoresis rash HEAD: Normocephalic, atraumatic  EYES: Conjunctiva/lids clear. Pupils round, reactive. EOMs intact.  EARS: External exam WNL, canals clear. Hearing grossly normal.  NOSE: No deformity or discharge.  MOUTH/THROAT: Lips w/o lesions.  RESPIRATORY: Breathing is even, unlabored. Lung sounds are clear   CARDIOVASCULAR: Heart RRR no murmurs, rubs or gallops. No peripheral edema.  GASTROINTESTINAL: Abdomen is soft, non-tender, not distended w/ normal bowel sounds. GENITOURINARY: Bladder non tender, not distended  MUSCULOSKELETAL: No abnormal joints or musculature NEUROLOGIC: Oriented X1. Cranial nerves 2-12 grossly intact. Moves all extremities no tremor. PSYCHIATRICeasily aggitated, no behavioral issues  Patient Active Problem List   Diagnosis Date Noted  . Closed comminuted intertrochanteric fracture of proximal end of right femur 05/27/2013  . Fracture femur, cervicotrochanteric 05/27/2013  . MALNUTRITION, PROTEIN-CALORIE  12/24/2006  . THROMBOCYTOPENIA NOS 12/24/2006  . DEPRESSION, MAJOR, MODERATE 12/24/2006  . PSYCHIATRIC DISORDER 12/24/2006  . LEARNING DISABILITY 12/24/2006  . DISEASE, ISCHEMIC HEART, CHRONIC NOS 12/24/2006  . CARDIOMYOPATHY 12/24/2006  . BLOCK, AV, COMPLETE 12/24/2006  . ATHEROSCLEROSIS, AORTIC 12/24/2006  . RENAL INSUFFICIENCY, ACUTE 12/24/2006  . Atrial fibrillation 12/24/2006  . HOMELESSNESS, HX OF 12/24/2006  . CHOLECYSTECTOMY, HX OF 12/24/2006    CBC    Component Value Date/Time   WBC 6.7 05/31/2013 0540    RBC 3.30* 05/31/2013 0540   HGB 10.5* 05/31/2013 0540   HCT 31.8* 05/31/2013 0540   PLT 90* 05/31/2013 0540   MCV 96.4 05/31/2013 0540   LYMPHSABS 1.7 05/27/2013 1034   MONOABS 0.3 05/27/2013 1034   EOSABS 0.1 05/27/2013 1034   BASOSABS 0.0 05/27/2013 1034    CMP     Component Value Date/Time   NA 137 05/30/2013 0455   K 4.1 05/30/2013 0455   CL 101 05/30/2013 0455   CO2 29 05/30/2013 0455   GLUCOSE 98 05/30/2013 0455   BUN 9 05/30/2013 0455   CREATININE 0.70 05/30/2013 0455   CALCIUM 8.4 05/30/2013 0455   PROT 7.1 05/28/2013 0704   ALBUMIN 3.6 05/28/2013 0704   AST 31 05/28/2013 0704   ALT 21 05/28/2013 0704   ALKPHOS 121* 05/28/2013 0704   BILITOT 0.9 05/28/2013 0704   GFRNONAA >90 05/30/2013 0455   GFRAA >90 05/30/2013 0455    Assessment and Plan  Closed comminuted intertrochanteric fracture of proximal end of right femur S/p ORIF - s/p 2 units PRBC- OT/PT  Atrial fibrillation Coreg 12.5 BI for rate control; pt was on coumadin PTA but INR sub. Was on Lovenox bridge, whern put back on coumadin INR got high -will watch.  BLOCK, AV, COMPLETE S/p pacer prior has checked by [facility] q 3 month  THROMBOCYTOPENIA NOS Unknown cause- PLT 88 in 2009, 108 recently  DEPRESSION, MAJOR, MODERATE On zoloft and Remeron to continue  PSYCHIATRIC DISORDER Cause unknown-has been seen by psych once in 2009 without firm dx- on Remeron and zoloft  MALNUTRITION, PROTEIN-CALORIE Balanced nutrition at [facility]  RENAL INSUFFICIENCY, ACUTE BUN 9/Cr 0.7 on 12/21    Margit Hanks, MD

## 2013-06-04 NOTE — Assessment & Plan Note (Signed)
On zoloft and Remeron to continue

## 2013-06-04 NOTE — Assessment & Plan Note (Signed)
Cause unknown-has been seen by psych once in 2009 without firm dx- on Remeron and zoloft

## 2013-06-04 NOTE — Assessment & Plan Note (Signed)
S/p pacer prior has checked by [place] q 3 month

## 2013-06-04 NOTE — Assessment & Plan Note (Signed)
S/p ORIF - s/p 2 units PRBC- OT/PT

## 2013-06-04 NOTE — Assessment & Plan Note (Signed)
Balanced nutrition at [facility]

## 2013-06-04 NOTE — Assessment & Plan Note (Signed)
BUN 9/Cr 0.7 on 12/21

## 2013-06-04 NOTE — Assessment & Plan Note (Signed)
Coreg 12.5 BI for rate control; pt was on coumadin PTA but INR sub. Was on Lovenox bridge, whern put back on coumadin INR got high -will watch.

## 2013-06-04 NOTE — Assessment & Plan Note (Signed)
Unknown cause- PLT 88 in 2009, 108 recently

## 2013-06-22 ENCOUNTER — Non-Acute Institutional Stay (SKILLED_NURSING_FACILITY): Payer: Medicare Other | Admitting: Nurse Practitioner

## 2013-06-22 ENCOUNTER — Other Ambulatory Visit: Payer: Self-pay | Admitting: Nurse Practitioner

## 2013-06-22 DIAGNOSIS — I4891 Unspecified atrial fibrillation: Secondary | ICD-10-CM

## 2013-06-22 DIAGNOSIS — Z7901 Long term (current) use of anticoagulants: Secondary | ICD-10-CM

## 2013-06-22 DIAGNOSIS — S72143A Displaced intertrochanteric fracture of unspecified femur, initial encounter for closed fracture: Secondary | ICD-10-CM

## 2013-06-22 DIAGNOSIS — S72141A Displaced intertrochanteric fracture of right femur, initial encounter for closed fracture: Secondary | ICD-10-CM

## 2013-06-22 NOTE — Progress Notes (Signed)
Patient ID: [name], male   DOB: 1946/04/08, 68 y.o.   MRN: [id number]    Nursing Home Location:  [place] of Service: SNF (36)  PCP: [name], MD  No Known Allergies  Chief Complaint  Patient presents with  . Acute Visit    HPI:  68 year old male who was transferred from the hospital to [facility] after open reduction of the hip; pt with history of a fib and is currently on coumadin 7 mg; looking back pts INR have not been in the therapeutic range for sometime and have either been high requiring coumadin to be help or low; pts current INR is 1.1 he has been on 7 mg for over 1 week without changes in INR; pt has been complaint with medication And there has been no signs of bleeding  Review of Systems:  Review of Systems  Constitutional: Negative for fever, chills and malaise/fatigue.  HENT: Negative for nosebleeds.   Respiratory: Positive for cough (chronic; smoker). Negative for shortness of breath.   Cardiovascular: Negative for chest pain.  Gastrointestinal: Negative for abdominal pain, diarrhea, constipation and blood in stool.  Genitourinary: Negative for dysuria and hematuria.  Musculoskeletal: Negative for myalgias.  Skin: Negative for itching and rash.  Neurological: Negative for dizziness, weakness and headaches.  Psychiatric/Behavioral: Negative for depression.     Past Medical History  Diagnosis Date  . Depression   . Hypomagnesemia   . Dyspepsia   . Fall at nursing home 05/27/2013    "slipped on water; said I broke my right hip" (05/27/2013)  . Migraine     "used to have them bad; haven't had one in awhile" (05/27/2013)  . Gout attack     "not too long ago; in my right foot" (05/27/2013)  . Complete heart block     reason for pacemaker/notes 05/27/2013  . Atrial fibrillation     /notes 05/27/2013   Past Surgical History  Procedure Laterality Date  . Insert / replace / remove pacemaker    . Appendectomy    .  Cholecystectomy    . Open reduction of hip Right 05/28/2013    Procedure: OPEN REDUCTION OF HIP;  Surgeon: Renette Butters, MD;  Location: [place];  Service: Orthopedics;  Laterality: Right;   Social History:   reports that he has been smoking Cigarettes.  He has a 15.25 pack-year smoking history. He has quit using smokeless tobacco. His smokeless tobacco use included Snuff and Chew. He reports that he drinks alcohol. His drug history is not on file.  No family history on file.  Medications: Patient's Medications  New Prescriptions   No medications on file  Previous Medications   ALENDRONATE (FOSAMAX) 35 MG TABLET    Take 35 mg by mouth every 7 (seven) days. Take with a full glass of water on an empty stomach.   ASPIRIN 81 MG CHEWABLE TABLET    Chew 81 mg by mouth daily.   CALCIUM CARBONATE-VIT D-MIN (CALTRATE 600+D PLUS MINERALS) 600-800 MG-UNIT TABS    Take 1 tablet by mouth daily.   CARVEDILOL (COREG) 12.5 MG TABLET    Take 12.5 mg by mouth 2 (two) times daily with a meal.   COLCHICINE 0.6 MG TABLET    Take 0.6 mg by mouth every 6 (six) hours as needed (pain).   DOCUSATE SODIUM (COLACE) 100 MG CAPSULE    Take 1 capsule (100 mg total) by mouth 2 (two) times daily. Continue  this while taking narcotics to help with bowel movements   DOXAZOSIN (CARDURA) 4 MG TABLET    Take 4 mg by mouth every morning.   FINASTERIDE (PROSCAR) 5 MG TABLET    Take 5 mg by mouth every morning.   HYDROCODONE-ACETAMINOPHEN (NORCO) 5-325 MG PER TABLET    Take two tablets by mouth every four hours as needed for pain   LISINOPRIL (PRINIVIL,ZESTRIL) 5 MG TABLET    Take 5 mg by mouth every morning.   MAGNESIUM OXIDE (MAG-OX) 400 MG TABLET    Take 400 mg by mouth 2 (two) times daily.   MELATONIN 1 MG TABS    Take 1 mg by mouth at bedtime.   MIRTAZAPINE (REMERON) 15 MG TABLET    Take 15 mg by mouth every morning.   PRAVASTATIN (PRAVACHOL) 20 MG TABLET    Take 20 mg by mouth at bedtime.   SERTRALINE (ZOLOFT) 50 MG TABLET     Take 50 mg by mouth daily.   VITAMIN D, ERGOCALCIFEROL, (DRISDOL) 50000 UNITS CAPS CAPSULE    Take 50,000 Units by mouth every 7 (seven) days.  Modified Medications   Modified Medication Previous Medication   WARFARIN (COUMADIN) 6 MG TABLET warfarin (COUMADIN) 6 MG tablet      Take 6 mg by mouth daily. Takes 7 mg daily    Take 1 tablet (6 mg total) by mouth daily. Take 0.5 mg on Monday and Friday along with 6mg  tablet to equal 6.5mg .  Take 1mg  tablet along with 6 mg tablet to equal 7mg  on all other days of the week.  Discontinued Medications   WARFARIN (COUMADIN) 1 MG TABLET    Take 0.5-1 tablets (0.5-1 mg total) by mouth daily. Take 0.5 mg on Monday and Friday along with 6mg  tablet to equal 6.5mg .  Take 1mg  tablet along with 6 mg tablet to equal 7mg  on all other days of the week.     Physical Exam:  Filed Vitals:   06/22/13 1053  BP: 115/72  Pulse: 82  Temp: 98.1 F (36.7 C)  Resp: 20    Physical Exam  Constitutional:  Thin male in NAD  HENT:  Mouth/Throat: Oropharynx is clear and moist. No oropharyngeal exudate.  Eyes: Conjunctivae and EOM are normal. Pupils are equal, round, and reactive to light.  Neck: Normal range of motion. Neck supple.  Cardiovascular: Normal rate.   Pulmonary/Chest: Effort normal and breath sounds normal. No respiratory distress.  Abdominal: Soft. Bowel sounds are normal. He exhibits no distension.  Musculoskeletal: He exhibits no edema and no tenderness.  Neurological: He is alert.  Skin: Skin is warm and dry. No erythema.  Psychiatric: Affect normal.     Labs reviewed: Basic Metabolic Panel:  Recent Labs  05/27/13 1034 05/27/13 2040 05/28/13 0704 05/29/13 0459 05/30/13 0455  NA 137  --  135 129* 137  K 4.0  --  3.7 4.3 4.1  CL 103  --  100 97 101  CO2 29  --  27 23 29   GLUCOSE 103*  --  132* 123* 98  BUN 11  --  10 10 9   CREATININE 0.72  --  0.64 0.65 0.70  CALCIUM 9.1  --  9.0 8.3* 8.4  MG  --   --  1.7  --   --   PHOS  --  2.9   --   --   --    Liver Function Tests:  Recent Labs  05/28/13 0704  AST 31  ALT 21  ALKPHOS 121*  BILITOT 0.9  PROT 7.1  ALBUMIN 3.6   No results found for this basename: LIPASE, AMYLASE,  in the last 8760 hours No results found for this basename: AMMONIA,  in the last 8760 hours CBC:  Recent Labs  05/27/13 1034  05/29/13 0459 05/30/13 0455 05/31/13 0540  WBC 5.5  < > 6.2 6.0 6.7  NEUTROABS 3.4  --   --   --   --   HGB 12.6*  < > 7.7* 10.2* 10.5*  HCT 38.6*  < > 23.0* 30.6* 31.8*  MCV 98.2  < > 95.4 95.0 96.4  PLT 109*  < > 74* 71* 90*  < > = values in this interval not displayed. Cardiac Enzymes: No results found for this basename: CKTOTAL, CKMB, CKMBINDEX, TROPONINI,  in the last 8760 hours BNP: No components found with this basename: POCBNP,  CBG:  Recent Labs  05/28/13 0639 05/28/13 0913 05/28/13 1139  GLUCAP 134* 122* 126*    Assessment/Plan 1. Atrial fibrillation -rate controlled with current medication  - on anticoagulation long term  2. Closed comminuted intertrochanteric fracture of proximal end of right femur S/p ORIF; working with therapy  3. Long term (current) use of anticoagulants -has been on coumadin Will dc at this time and start xarelto 20 mg daily -will get cbc and cmp at this time

## 2013-06-29 ENCOUNTER — Non-Acute Institutional Stay (SKILLED_NURSING_FACILITY): Payer: Medicare Other | Admitting: Nurse Practitioner

## 2013-06-29 DIAGNOSIS — D62 Acute posthemorrhagic anemia: Secondary | ICD-10-CM

## 2013-06-29 DIAGNOSIS — S72141A Displaced intertrochanteric fracture of right femur, initial encounter for closed fracture: Secondary | ICD-10-CM

## 2013-06-29 DIAGNOSIS — F321 Major depressive disorder, single episode, moderate: Secondary | ICD-10-CM

## 2013-06-29 DIAGNOSIS — E46 Unspecified protein-calorie malnutrition: Secondary | ICD-10-CM

## 2013-06-29 DIAGNOSIS — N189 Chronic kidney disease, unspecified: Secondary | ICD-10-CM

## 2013-06-29 DIAGNOSIS — I259 Chronic ischemic heart disease, unspecified: Secondary | ICD-10-CM

## 2013-06-29 DIAGNOSIS — E559 Vitamin D deficiency, unspecified: Secondary | ICD-10-CM

## 2013-06-29 DIAGNOSIS — I4891 Unspecified atrial fibrillation: Secondary | ICD-10-CM

## 2013-06-29 DIAGNOSIS — N400 Enlarged prostate without lower urinary tract symptoms: Secondary | ICD-10-CM

## 2013-06-29 DIAGNOSIS — S72143A Displaced intertrochanteric fracture of unspecified femur, initial encounter for closed fracture: Secondary | ICD-10-CM

## 2013-06-29 NOTE — Progress Notes (Signed)
Patient ID: [name], male   DOB: 1945-08-13, 68 y.o.   MRN: [id number]    Nursing Home Location:  [place] of Service: SNF (36)  PCP: [name], MD  No Known Allergies  Chief Complaint  Patient presents with  . Discharge Note    HPI:  68  year old male who is at [facility] for rehab after open reduction of the hip; pt with hx of afib and was on coumadin therapy however due to not being able to control INRs he is now on xarelto  pt doing well with therapy and now ready to be transitioned to assisted living; pt reports come hip pain from surgery but is able to walk with a walker.  Review of Systems:  Review of Systems  Constitutional: Negative for fever, chills and malaise/fatigue.  HENT: Negative for nosebleeds.   Respiratory: Positive for cough (chronic; smoker). Negative for shortness of breath.   Cardiovascular: Negative for chest pain and leg swelling.  Gastrointestinal: Negative for abdominal pain, diarrhea, constipation and blood in stool.  Genitourinary: Negative for dysuria and hematuria.  Musculoskeletal: Positive for joint pain (hip pain exacerbated by movement). Negative for myalgias.  Skin: Negative for itching and rash.  Neurological: Negative for dizziness, weakness and headaches.  Psychiatric/Behavioral: Negative for depression.     Past Medical History  Diagnosis Date  . Depression   . Hypomagnesemia   . Dyspepsia   . Fall at nursing home 05/27/2013    "slipped on water; said I broke my right hip" (05/27/2013)  . Migraine     "used to have them bad; haven't had one in awhile" (05/27/2013)  . Gout attack     "not too long ago; in my right foot" (05/27/2013)  . Complete heart block     reason for pacemaker/notes 05/27/2013  . Atrial fibrillation     /notes 05/27/2013   Past Surgical History  Procedure Laterality Date  . Insert / replace / remove pacemaker    . Appendectomy    . Cholecystectomy    . Open  reduction of hip Right 05/28/2013    Procedure: OPEN REDUCTION OF HIP;  Surgeon: Renette Butters, MD;  Location: [place];  Service: Orthopedics;  Laterality: Right;   Social History:   reports that he has been smoking Cigarettes.  He has a 15.25 pack-year smoking history. He has quit using smokeless tobacco. His smokeless tobacco use included Snuff and Chew. He reports that he drinks alcohol. His drug history is not on file.  No family history on file.  Medications: Patient's Medications  New Prescriptions   No medications on file  Previous Medications   ALENDRONATE (FOSAMAX) 35 MG TABLET    Take 35 mg by mouth every 7 (seven) days. Take with a full glass of water on an empty stomach.   ASPIRIN 81 MG CHEWABLE TABLET    Chew 81 mg by mouth daily.   CALCIUM CARBONATE-VIT D-MIN (CALTRATE 600+D PLUS MINERALS) 600-800 MG-UNIT TABS    Take 1 tablet by mouth daily.   CARVEDILOL (COREG) 12.5 MG TABLET    Take 12.5 mg by mouth 2 (two) times daily with a meal.   COLCHICINE 0.6 MG TABLET    Take 0.6 mg by mouth every 6 (six) hours as needed (pain).   DOCUSATE SODIUM (COLACE) 100 MG CAPSULE    Take 1 capsule (100 mg total) by mouth 2 (two) times daily. Continue this while taking narcotics to help  with bowel movements   DOXAZOSIN (CARDURA) 4 MG TABLET    Take 4 mg by mouth every morning.   FINASTERIDE (PROSCAR) 5 MG TABLET    Take 5 mg by mouth every morning.   HYDROCODONE-ACETAMINOPHEN (NORCO) 5-325 MG PER TABLET    Take two tablets by mouth every four hours as needed for pain   LISINOPRIL (PRINIVIL,ZESTRIL) 5 MG TABLET    Take 5 mg by mouth every morning.   MAGNESIUM OXIDE (MAG-OX) 400 MG TABLET    Take 400 mg by mouth 2 (two) times daily.   MELATONIN 1 MG TABS    Take 1 mg by mouth at bedtime.   MIRTAZAPINE (REMERON) 15 MG TABLET    Take 15 mg by mouth every morning.   PRAVASTATIN (PRAVACHOL) 20 MG TABLET    Take 20 mg by mouth at bedtime.   RIVAROXABAN (XARELTO) 20 MG TABS TABLET    Take 20 mg by  mouth daily with supper.   SERTRALINE (ZOLOFT) 50 MG TABLET    Take 50 mg by mouth daily.   VITAMIN D, ERGOCALCIFEROL, (DRISDOL) 50000 UNITS CAPS CAPSULE    Take 50,000 Units by mouth every 7 (seven) days.  Modified Medications   No medications on file  Discontinued Medications   WARFARIN (COUMADIN) 6 MG TABLET    Take 6 mg by mouth daily. Takes 7 mg daily     Physical Exam: Physical Exam  Constitutional:  Thin male in NAD  HENT:  Mouth/Throat: Oropharynx is clear and moist. No oropharyngeal exudate.  Eyes: Conjunctivae and EOM are normal. Pupils are equal, round, and reactive to light.  Neck: Normal range of motion. Neck supple.  Cardiovascular: Normal rate.   Pulmonary/Chest: Effort normal and breath sounds normal. No respiratory distress.  Abdominal: Soft. Bowel sounds are normal. He exhibits no distension.  Musculoskeletal: He exhibits no edema and no tenderness.  Neurological: He is alert.  Skin: Skin is warm and dry. No erythema.  Scar to right hip incision   Psychiatric: Affect normal.    Filed Vitals:   06/29/13 1426  BP: 134/64  Pulse: 80  Temp: 98.3 F (36.8 C)  Resp: 18      Labs reviewed: Basic Metabolic Panel:  Recent Labs  05/27/13 1034 05/27/13 2040 05/28/13 0704 05/29/13 0459 05/30/13 0455  NA 137  --  135 129* 137  K 4.0  --  3.7 4.3 4.1  CL 103  --  100 97 101  CO2 29  --  _0 GLUCOSE 103*  --  132* 123* 98  BUN 11  --  _1 CREATININE 0.72  --  0.64 0.65 0.70  CALCIUM 9.1  --  9.0 8.3* 8.4  MG  --   --  1.7  --   --   PHOS  --  2.9  --   --   --    Liver Function Tests:  Recent Labs  05/28/13 0704  AST 31  ALT 21  ALKPHOS 121*  BILITOT 0.9  PROT 7.1  ALBUMIN 3.6   No results found for this basename: LIPASE, AMYLASE,  in the last 8760 hours No results found for this basename: AMMONIA,  in the last 8760 hours CBC:  Recent Labs  05/27/13 1034  05/29/13 0459 05/30/13 0455 05/31/13 0540  WBC 5.5  < > 6.2 6.0 6.7   NEUTROABS 3.4  --   --   --   --   HGB 12.6*  < >  7.7* 10.2* 10.5*  HCT 38.6*  < > 23.0* 30.6* 31.8*  MCV 98.2  < > 95.4 95.0 96.4  PLT 109*  < > 74* 71* 90*  < > = values in this interval not displayed. Cardiac Enzymes: No results found for this basename: CKTOTAL, CKMB, CKMBINDEX, TROPONINI,  in the last 8760 hours BNP: No components found with this basename: POCBNP,  CBG:  Recent Labs  05/28/13 0639 05/28/13 0913 05/28/13 1139  GLUCAP 134* 122* 126*   CMP with Estimated GFR    Result: 06/23/2013 10:19 PM   ( Status: F )     C Sodium 136     135-145 mEq/L SLN   Potassium 4.5     3.5-5.3 mEq/L SLN   Chloride 101     96-112 mEq/L SLN   CO2 26     19-32 mEq/L SLN   Glucose 92     70-99 mg/dL SLN   BUN 22     6-23 mg/dL SLN   Creatinine 0.64     0.50-1.35 mg/dL SLN   Bilirubin, Total 0.9     0.3-1.2 mg/dL SLN   Alkaline Phosphatase 144   H 39-117 U/L SLN   AST/SGOT 15     0-37 U/L SLN   ALT/SGPT 11     0-53 U/L SLN   Total Protein 7.7     6.0-8.3 g/dL SLN   Albumin 4.3     3.5-5.2 g/dL SLN   Calcium 9.9     8.4-10.5 mg/dL SLN   Est GFR, African American >89      mL/min SLN   Est GFR, NonAfrican American >89      mL/min SLN C CBC with Diff    Result: 06/23/2013 8:38 PM   ( Status: F )       WBC 5.6     4.0-10.5 K/uL SLN   RBC 4.01   L 4.22-5.81 MIL/uL SLN   Hemoglobin 12.6   L 13.0-17.0 g/dL SLN   Hematocrit 37.3   L 39.0-52.0 % SLN   MCV 93.0     78.0-100.0 fL SLN   MCH 31.4     26.0-34.0 pg SLN   MCHC 33.8     30.0-36.0 g/dL SLN   RDW 14.0     11.5-15.5 % SLN   Platelet Count 136   L 150-400 K/uL SLN   Granulocyte % 56     43-77 % SLN   Absolute Gran 3.1     1.7-7.7 K/uL SLN   Lymph % 34     12-46 % SLN   Absolute Lymph 1.9     0.7-4.0 K/uL SLN   Mono % 7     3-12 % SLN   Absolute Mono 0.4     0.1-1.0 K/uL SLN   Eos % 3     0-5 % SLN   Absolute Eos 0.1     0.0-0.7 K/uL SLN   Baso % 0     0-1 % SLN   Absolute Baso 0.0     0.0-0.1 K/uL SLN   Smear Review Criteria  for review not met  SLN   Lipid Profile    Result: 06/23/2013 10:19 PM   ( Status: F )       Cholesterol 148     0-200 mg/dL SLN C Triglyceride 101     <150 mg/dL SLN   HDL Cholesterol 38   L >39 mg/dL SLN   Total Chol/HDL Ratio 3.9  Ratio SLN   VLDL Cholesterol (Calc) 20     0-40 mg/dL SLN   LDL Cholesterol (Calc) 90  Assessment/Plan 1. Atrial fibrillation Rate controlled on current medications;  xarelto 20 mg daily for anticoagulation  2. Closed comminuted intertrochanteric fracture of proximal end of right femur -scar to op site -will make sure pt has appropriate ortho follow up scheduled before discharge  -doing well with therapy; no ready to transitioned into assisted living; will need outpt PT/OT  3. MALNUTRITION, PROTEIN-CALORIE -conts on remeron  -reports good appetite but has weight loss, will need to be further monitored as outpt  4. DISEASE, ISCHEMIC HEART, CHRONIC NOS -conts ASA and xarelto  5. Hypomagnesemia -conts magnesium   6. Acute blood loss anemia -improved on recent labs  7. Chronic kidney disease, unspecified unchanged  8. BPH (benign prostatic hyperplasia) -stable on proscar and cardura  9. Major depressive disorder, single episode, moderate -conts on zoloft  10. Unspecified vitamin D deficiency -has been started on Vit D 50000 units weekly  pt is stable for discharge to assisted living-will need PT/OT/Nursing per home health. DME needed includes 2 wheeled walker. Rx written.  will need to follow up with ortho and PCP.

## 2014-09-21 ENCOUNTER — Emergency Department (HOSPITAL_COMMUNITY): Payer: Medicare Other

## 2014-09-21 ENCOUNTER — Emergency Department (HOSPITAL_COMMUNITY)
Admission: EM | Admit: 2014-09-21 | Discharge: 2014-09-22 | Disposition: A | Discharge status: Home / Self Care | Payer: Medicare Other | Source: Home / Self Care | Attending: Emergency Medicine | Admitting: Emergency Medicine

## 2014-09-21 ENCOUNTER — Encounter (HOSPITAL_COMMUNITY): Payer: Self-pay | Admitting: Emergency Medicine

## 2014-09-21 DIAGNOSIS — Z87891 Personal history of nicotine dependence: Secondary | ICD-10-CM

## 2014-09-21 DIAGNOSIS — F329 Major depressive disorder, single episode, unspecified: Secondary | ICD-10-CM | POA: Insufficient documentation

## 2014-09-21 DIAGNOSIS — N400 Enlarged prostate without lower urinary tract symptoms: Secondary | ICD-10-CM | POA: Diagnosis present

## 2014-09-21 DIAGNOSIS — Z993 Dependence on wheelchair: Secondary | ICD-10-CM

## 2014-09-21 DIAGNOSIS — Z7982 Long term (current) use of aspirin: Secondary | ICD-10-CM

## 2014-09-21 DIAGNOSIS — Z7901 Long term (current) use of anticoagulants: Secondary | ICD-10-CM

## 2014-09-21 DIAGNOSIS — K921 Melena: Principal | ICD-10-CM | POA: Diagnosis present

## 2014-09-21 DIAGNOSIS — K2970 Gastritis, unspecified, without bleeding: Secondary | ICD-10-CM | POA: Diagnosis present

## 2014-09-21 DIAGNOSIS — I2510 Atherosclerotic heart disease of native coronary artery without angina pectoris: Secondary | ICD-10-CM | POA: Diagnosis present

## 2014-09-21 DIAGNOSIS — Z79899 Other long term (current) drug therapy: Secondary | ICD-10-CM | POA: Insufficient documentation

## 2014-09-21 DIAGNOSIS — Z8739 Personal history of other diseases of the musculoskeletal system and connective tissue: Secondary | ICD-10-CM

## 2014-09-21 DIAGNOSIS — I4891 Unspecified atrial fibrillation: Secondary | ICD-10-CM | POA: Diagnosis present

## 2014-09-21 DIAGNOSIS — I10 Essential (primary) hypertension: Secondary | ICD-10-CM | POA: Diagnosis present

## 2014-09-21 DIAGNOSIS — R079 Chest pain, unspecified: Secondary | ICD-10-CM

## 2014-09-21 DIAGNOSIS — I482 Chronic atrial fibrillation: Secondary | ICD-10-CM | POA: Diagnosis present

## 2014-09-21 DIAGNOSIS — Z950 Presence of cardiac pacemaker: Secondary | ICD-10-CM

## 2014-09-21 DIAGNOSIS — D638 Anemia in other chronic diseases classified elsewhere: Secondary | ICD-10-CM | POA: Diagnosis present

## 2014-09-21 DIAGNOSIS — D696 Thrombocytopenia, unspecified: Secondary | ICD-10-CM | POA: Diagnosis present

## 2014-09-21 DIAGNOSIS — E46 Unspecified protein-calorie malnutrition: Secondary | ICD-10-CM | POA: Diagnosis present

## 2014-09-21 DIAGNOSIS — Z681 Body mass index (BMI) 19 or less, adult: Secondary | ICD-10-CM

## 2014-09-21 LAB — TROPONIN I

## 2014-09-21 LAB — COMPREHENSIVE METABOLIC PANEL
ALK PHOS: 94 U/L (ref 39–117)
ALT: 15 U/L (ref 0–53)
ANION GAP: 9 (ref 5–15)
AST: 19 U/L (ref 0–37)
Albumin: 3.2 g/dL — ABNORMAL LOW (ref 3.5–5.2)
BILIRUBIN TOTAL: 0.5 mg/dL (ref 0.3–1.2)
BUN: 24 mg/dL — AB (ref 6–23)
CHLORIDE: 106 mmol/L (ref 96–112)
CO2: 25 mmol/L (ref 19–32)
CREATININE: 0.63 mg/dL (ref 0.50–1.35)
Calcium: 8.6 mg/dL (ref 8.4–10.5)
GFR calc Af Amer: >90 mL/min (ref >90)
GFR calc non Af Amer: >90 mL/min (ref >90)
GLUCOSE: 102 mg/dL — AB (ref 70–99)
POTASSIUM: 3.8 mmol/L (ref 3.5–5.1)
Sodium: 140 mmol/L (ref 135–145)
Total Protein: 5.9 g/dL — ABNORMAL LOW (ref 6.0–8.3)

## 2014-09-21 LAB — CBC
HCT: 32.2 % — ABNORMAL LOW (ref 39.0–52.0)
Hemoglobin: 10.4 g/dL — ABNORMAL LOW (ref 13.0–17.0)
MCH: 31.4 pg (ref 26.0–34.0)
MCHC: 32.3 g/dL (ref 30.0–36.0)
MCV: 97.3 fL (ref 78.0–100.0)
PLATELETS: 135 10*3/uL — AB (ref 150–400)
RBC: 3.31 MIL/uL — ABNORMAL LOW (ref 4.22–5.81)
RDW: 13.8 % (ref 11.5–15.5)
WBC: 5.4 10*3/uL (ref 4.0–10.5)

## 2014-09-21 MED ORDER — TRAMADOL HCL 50 MG PO TABS
50.0000 mg | ORAL_TABLET | Freq: Once | ORAL | Status: AC
  Administered 2014-09-22: 50 mg via ORAL
  Filled 2014-09-21: qty 1

## 2014-09-21 NOTE — ED Notes (Signed)
Per EMS:  Pt from [facility].  Pt has been complaining of chest discomfort since Sunday, with n/v over night Sunday in to Monday.  Pt has not had any relief since.  L chest pain, non reproduceable.  Pt received ASA and 2 nitro en route.  Initial BP 141/63, BP after 2 nitro 98/30.

## 2014-09-21 NOTE — ED Notes (Signed)
Phlebotomy at bedside.

## 2014-09-21 NOTE — ED Provider Notes (Addendum)
CSN: [id number]     Arrival date & time 09/21/14  2053 History   First MD Initiated Contact with Patient 09/21/14 2055     Chief Complaint  Patient presents with  . Chest Pain     (Consider location/radiation/quality/duration/timing/severity/associated sxs/prior Treatment) Patient is a 69 y.o. male presenting with chest pain. The history is provided by the patient.  Chest Pain Associated symptoms: no abdominal pain, no back pain, no cough, no fever, no headache, no palpitations, no shortness of breath and not vomiting   Patient c/o mid to left chest pain for the past 2-3 days. Pain is constant, sharp, worse w palpation. No radiating. No associated sob, nv or diaphoresis. Occurs at rest, no relation to activity or exertion. No change in pain whether upright or supine. No pleuritic pain. Denies cough or uri c/o. No fever or chills. Denies chest wall injury or strain. No heartburn. Denies personal or family hx cad. +smoker. On xarelto for hx afib. No leg pain or swelling. No recent immobility, trauma, travel, or surgery.       Past Medical History  Diagnosis Date  . Depression   . Hypomagnesemia   . Dyspepsia   . Fall at nursing home 05/27/2013    "slipped on water; said I broke my right hip" (05/27/2013)  . Migraine     "used to have them bad; haven't had one in awhile" (05/27/2013)  . Gout attack     "not too long ago; in my right foot" (05/27/2013)  . Complete heart block     reason for pacemaker/notes 05/27/2013  . Atrial fibrillation     /notes 05/27/2013   Past Surgical History  Procedure Laterality Date  . Insert / replace / remove pacemaker    . Appendectomy    . Cholecystectomy    . Open reduction of hip Right 05/28/2013    Procedure: OPEN REDUCTION OF HIP;  Surgeon: Renette Butters, MD;  Location: [facility];  Service: Orthopedics;  Laterality: Right;   History reviewed. No pertinent family history. History  Substance Use Topics  . Smoking status: Former Smoker --  0.25 packs/day for 61 years    Types: Cigarettes  . Smokeless tobacco: Former Systems developer    Types: Snuff, Chew     Comment: 05/27/2013 "haven't used chew or snuff in ~ 30 yr"  . Alcohol Use: Yes     Comment: 05/27/2013 "none in years; never had problem w/it"    Review of Systems  Constitutional: Negative for fever and chills.  HENT: Negative for sore throat.   Eyes: Negative for redness.  Respiratory: Negative for cough and shortness of breath.   Cardiovascular: Positive for chest pain. Negative for palpitations and leg swelling.  Gastrointestinal: Negative for vomiting, abdominal pain and diarrhea.  Genitourinary: Negative for dysuria and flank pain.  Musculoskeletal: Negative for back pain and neck pain.  Skin: Negative for rash.  Neurological: Negative for headaches.  Hematological: Does not bruise/bleed easily.  Psychiatric/Behavioral: Negative for confusion.      Allergies  Review of patient's allergies indicates no known allergies.  Home Medications   Prior to Admission medications   Medication Sig Start Date End Date Taking? Authorizing Provider  alendronate (FOSAMAX) 35 MG tablet Take 35 mg by mouth every 7 (seven) days. Take with a full glass of water on an empty stomach.    Historical Provider, MD  aspirin 81 MG chewable tablet Chew 81 mg by mouth daily.    Historical Provider, MD  Calcium Carbonate-Vit  D-Min (CALTRATE 600+D PLUS MINERALS) 600-800 MG-UNIT TABS Take 1 tablet by mouth daily.    Historical Provider, MD  carvedilol (COREG) 12.5 MG tablet Take 12.5 mg by mouth 2 (two) times daily with a meal.    Historical Provider, MD  colchicine 0.6 MG tablet Take 0.6 mg by mouth every 6 (six) hours as needed (pain).    Historical Provider, MD  docusate sodium (COLACE) 100 MG capsule Take 1 capsule (100 mg total) by mouth 2 (two) times daily. Continue this while taking narcotics to help with bowel movements 05/28/13   Renette Butters, MD  doxazosin (CARDURA) 4 MG tablet Take  4 mg by mouth every morning.    Historical Provider, MD  finasteride (PROSCAR) 5 MG tablet Take 5 mg by mouth every morning.    Historical Provider, MD  HYDROcodone-acetaminophen (NORCO) 5-325 MG per tablet Take two tablets by mouth every four hours as needed for pain 06/01/13   Blanchie Serve, MD  lisinopril (PRINIVIL,ZESTRIL) 5 MG tablet Take 5 mg by mouth every morning.    Historical Provider, MD  magnesium oxide (MAG-OX) 400 MG tablet Take 400 mg by mouth 2 (two) times daily.    Historical Provider, MD  Melatonin 1 MG TABS Take 1 mg by mouth at bedtime.    Historical Provider, MD  mirtazapine (REMERON) 15 MG tablet Take 15 mg by mouth every morning.    Historical Provider, MD  pravastatin (PRAVACHOL) 20 MG tablet Take 20 mg by mouth at bedtime.    Historical Provider, MD  Rivaroxaban (XARELTO) 20 MG TABS tablet Take 20 mg by mouth daily with supper.    Historical Provider, MD  sertraline (ZOLOFT) 50 MG tablet Take 50 mg by mouth daily.    Historical Provider, MD  Vitamin D, Ergocalciferol, (DRISDOL) 50000 UNITS CAPS capsule Take 50,000 Units by mouth every 7 (seven) days.    Historical Provider, MD   There were no vitals taken for this visit. Physical Exam  Constitutional: He is oriented to person, place, and time. He appears well-developed and well-nourished. No distress.  HENT:  Head: Atraumatic.  Mouth/Throat: Oropharynx is clear and moist.  Eyes: Conjunctivae are normal. No scleral icterus.  Neck: Neck supple. No JVD present. No tracheal deviation present.  Cardiovascular: Normal rate, regular rhythm, normal heart sounds and intact distal pulses.  Exam reveals no gallop and no friction rub.   No murmur heard. Pulmonary/Chest: Effort normal and breath sounds normal. No accessory muscle usage. No respiratory distress. He exhibits tenderness.  Abdominal: Soft. Bowel sounds are normal. He exhibits no distension and no mass. There is no tenderness. There is no rebound and no guarding.   Genitourinary:  No cva tenderness  Musculoskeletal: Normal range of motion. He exhibits no edema or tenderness.  ctl spine non tender, aligned.   Neurological: He is alert and oriented to person, place, and time.  Skin: Skin is warm and dry. No rash noted. He is not diaphoretic.  No shingles/rash in area of pain.   Psychiatric: He has a normal mood and affect.  Nursing note and vitals reviewed.   ED Course  Procedures (including critical care time) Labs Review  Results for orders placed or performed during the hospital encounter of 09/21/14  CBC  Result Value Ref Range   WBC 5.4 4.0 - 10.5 K/uL   RBC 3.31 (L) 4.22 - 5.81 MIL/uL   Hemoglobin 10.4 (L) 13.0 - 17.0 g/dL   HCT 32.2 (L) 39.0 - 52.0 %  MCV 97.3 78.0 - 100.0 fL   MCH 31.4 26.0 - 34.0 pg   MCHC 32.3 30.0 - 36.0 g/dL   RDW 13.8 11.5 - 15.5 %   Platelets 135 (L) 150 - 400 K/uL  Comprehensive metabolic panel  Result Value Ref Range   Sodium 140 135 - 145 mmol/L   Potassium 3.8 3.5 - 5.1 mmol/L   Chloride 106 96 - 112 mmol/L   CO2 25 19 - 32 mmol/L   Glucose, Bld 102 (H) 70 - 99 mg/dL   BUN 24 (H) 6 - 23 mg/dL   Creatinine, Ser 0.63 0.50 - 1.35 mg/dL   Calcium 8.6 8.4 - 10.5 mg/dL   Total Protein 5.9 (L) 6.0 - 8.3 g/dL   Albumin 3.2 (L) 3.5 - 5.2 g/dL   AST 19 0 - 37 U/L   ALT 15 0 - 53 U/L   Alkaline Phosphatase 94 39 - 117 U/L   Total Bilirubin 0.5 0.3 - 1.2 mg/dL   GFR calc non Af Amer >90 >90 mL/min   GFR calc Af Amer >90 >90 mL/min   Anion gap 9 5 - 15  Troponin I  Result Value Ref Range   Troponin I <0.03 <0.031 ng/mL   Dg Chest 2 View  09/21/2014   CLINICAL DATA:  Left-sided chest pain for 4 days.  EXAM: CHEST  2 VIEW  COMPARISON:  05/27/2013  FINDINGS: Unchanged positioning of dual-chamber pacer leads from the right. Negative cardiomediastinal silhouette. Streaky right infrahilar opacity. There is no edema, consolidation, effusion, or pneumothorax.  IMPRESSION: 1. No acute findings. 2. Mild scarring  or atelectasis in the right infrahilar lung.   Electronically Signed   By: Monte Fantasia M.D.   On: 09/21/2014 22:29       EKG Interpretation   Date/Time:  Wednesday September 21 2014 21:03:29 EDT Ventricular Rate:  71 PR Interval:    QRS Duration: 110 QT Interval:  435 QTC Calculation: 473 R Axis:   133 Text Interpretation:  Atrial fibrillation No significant change since last  tracing Confirmed by Ashok Cordia  MD, Lennette Bihari ([id number]) on 09/21/2014 9:26:40 PM      MDM   Iv ns. Continuous pulse ox and monitor. o2 [place].   Labs.  Cxr. Ecg.  Reviewed nursing notes and prior charts for additional history.   Ultram po.  Recheck pt comfortable. No distress. No sob. Afeb.  0008 delta/repeat troponin pending - signed out to Dr Johnney Killian to check repeat troponin - if neg, not increasing, may d/c w close pcp/cardiology follow up.       Lajean Saver, MD 09/22/14 [phone]

## 2014-09-22 LAB — I-STAT TROPONIN, ED: Troponin i, poc: 0 ng/mL (ref 0.00–0.08)

## 2014-09-22 MED ORDER — TRAMADOL HCL 50 MG PO TABS: 50.0000 mg | ORAL_TABLET | Freq: Four times a day (QID) | ORAL | Status: DC | PRN

## 2014-09-22 NOTE — Discharge Instructions (Signed)
It was our pleasure to provide your ER care today - we hope that you feel better.  Rest.  You may take ultram as need for pain - no driving when taking.  Follow up with primary care doctor/cardiologist in the next few days - call office this morning to arrange follow up appointment.  Return to ER if worse, new symptoms, fevers, persistent or recurrent chest pain, trouble breathing, other concern.  You were given pain medication in the ER - no driving for the next 4 hours.      Chest Pain (Nonspecific) It is often hard to give a specific diagnosis for the cause of chest pain. There is always a chance that your pain could be related to something serious, such as a heart attack or a blood clot in the lungs. You need to follow up with your health care provider for further evaluation. CAUSES   Heartburn.  Pneumonia or bronchitis.  Anxiety or stress.  Inflammation around your heart (pericarditis) or lung (pleuritis or pleurisy).  A blood clot in the lung.  A collapsed lung (pneumothorax). It can develop suddenly on its own (spontaneous pneumothorax) or from trauma to the chest.  Shingles infection (herpes zoster virus). The chest wall is composed of bones, muscles, and cartilage. Any of these can be the source of the pain.  The bones can be bruised by injury.  The muscles or cartilage can be strained by coughing or overwork.  The cartilage can be affected by inflammation and become sore (costochondritis). DIAGNOSIS  Lab tests or other studies may be needed to find the cause of your pain. Your health care provider may have you take a test called an ambulatory electrocardiogram (ECG). An ECG records your heartbeat patterns over a 24-hour period. You may also have other tests, such as:  Transthoracic echocardiogram (TTE). During echocardiography, sound waves are used to evaluate how blood flows through your heart.  Transesophageal echocardiogram (TEE).  Cardiac monitoring. This  allows your health care provider to monitor your heart rate and rhythm in real time.  Holter monitor. This is a portable device that records your heartbeat and can help diagnose heart arrhythmias. It allows your health care provider to track your heart activity for several days, if needed.  Stress tests by exercise or by giving medicine that makes the heart beat faster. TREATMENT   Treatment depends on what may be causing your chest pain. Treatment may include:  Acid blockers for heartburn.  Anti-inflammatory medicine.  Pain medicine for inflammatory conditions.  Antibiotics if an infection is present.  You may be advised to change lifestyle habits. This includes stopping smoking and avoiding alcohol, caffeine, and chocolate.  You may be advised to keep your head raised (elevated) when sleeping. This reduces the chance of acid going backward from your stomach into your esophagus. Most of the time, nonspecific chest pain will improve within 2-3 days with rest and mild pain medicine.  HOME CARE INSTRUCTIONS   If antibiotics were prescribed, take them as directed. Finish them even if you start to feel better.  For the next few days, avoid physical activities that bring on chest pain. Continue physical activities as directed.  Do not use any tobacco products, including cigarettes, chewing tobacco, or electronic cigarettes.  Avoid drinking alcohol.  Only take medicine as directed by your health care provider.  Follow your health care provider's suggestions for further testing if your chest pain does not go away.  Keep any follow-up appointments you made. If you  do not go to an appointment, you could develop lasting (chronic) problems with pain. If there is any problem keeping an appointment, call to reschedule. SEEK MEDICAL CARE IF:   Your chest pain does not go away, even after treatment.  You have a rash with blisters on your chest.  You have a fever. SEEK IMMEDIATE MEDICAL  CARE IF:   You have increased chest pain or pain that spreads to your arm, neck, jaw, back, or abdomen.  You have shortness of breath.  You have an increasing cough, or you cough up blood.  You have severe back or abdominal pain.  You feel nauseous or vomit.  You have severe weakness.  You faint.  You have chills. This is an emergency. Do not wait to see if the pain will go away. Get medical help at once. Call your local emergency services (911 in [place] Do not drive yourself to the hospital. MAKE SURE YOU:   Understand these instructions.  Will watch your condition.  Will get help right away if you are not doing well or get worse. Document Released: 03/06/2005 Document Revised: 06/01/2013 Document Reviewed: 12/31/2007 [facility] Patient Information 2015 [place], [place]. This information is not intended to replace advice given to you by your health care provider. Make sure you discuss any questions you have with your health care provider.    Chest Wall Pain Chest wall pain is pain in or around the bones and muscles of your chest. It may take up to 6 weeks to get better. It may take longer if you must stay physically active in your work and activities.  CAUSES  Chest wall pain may happen on its own. However, it may be caused by:  A viral illness like the flu.  Injury.  Coughing.  Exercise.  Arthritis.  Fibromyalgia.  Shingles. HOME CARE INSTRUCTIONS   Avoid overtiring physical activity. Try not to strain or perform activities that cause pain. This includes any activities using your chest or your abdominal and side muscles, especially if heavy weights are used.  Put ice on the sore area.  Put ice in a plastic bag.  Place a towel between your skin and the bag.  Leave the ice on for 15-20 minutes per hour while awake for the first 2 days.  Only take over-the-counter or prescription medicines for pain, discomfort, or fever as directed by your caregiver. SEEK  IMMEDIATE MEDICAL CARE IF:   Your pain increases, or you are very uncomfortable.  You have a fever.  Your chest pain becomes worse.  You have new, unexplained symptoms.  You have nausea or vomiting.  You feel sweaty or lightheaded.  You have a cough with phlegm (sputum), or you cough up blood. MAKE SURE YOU:   Understand these instructions.  Will watch your condition.  Will get help right away if you are not doing well or get worse. Document Released: 05/27/2005 Document Revised: 08/19/2011 Document Reviewed: 01/21/2011 [facility] Patient Information 2015 [place], [place]. This information is not intended to replace advice given to you by your health care provider. Make sure you discuss any questions you have with your health care provider.

## 2014-09-22 NOTE — ED Notes (Signed)
PTAR CALLED @0117 .

## 2014-09-24 ENCOUNTER — Inpatient Hospital Stay (HOSPITAL_COMMUNITY)
Admission: EM | Admit: 2014-09-24 | Discharge: 2014-09-26 | DRG: 378 | Disposition: A | Discharge status: Skilled Nursing Facility | Payer: Medicare Other | Attending: Internal Medicine | Admitting: Internal Medicine

## 2014-09-24 ENCOUNTER — Encounter (HOSPITAL_COMMUNITY): Payer: Self-pay | Admitting: Emergency Medicine

## 2014-09-24 ENCOUNTER — Inpatient Hospital Stay (HOSPITAL_COMMUNITY): Payer: Medicare Other

## 2014-09-24 DIAGNOSIS — I259 Chronic ischemic heart disease, unspecified: Secondary | ICD-10-CM | POA: Diagnosis not present

## 2014-09-24 DIAGNOSIS — I10 Essential (primary) hypertension: Secondary | ICD-10-CM | POA: Diagnosis present

## 2014-09-24 DIAGNOSIS — J690 Pneumonitis due to inhalation of food and vomit: Secondary | ICD-10-CM | POA: Diagnosis not present

## 2014-09-24 DIAGNOSIS — D696 Thrombocytopenia, unspecified: Secondary | ICD-10-CM | POA: Diagnosis present

## 2014-09-24 DIAGNOSIS — I482 Chronic atrial fibrillation: Secondary | ICD-10-CM

## 2014-09-24 DIAGNOSIS — F321 Major depressive disorder, single episode, moderate: Secondary | ICD-10-CM | POA: Diagnosis not present

## 2014-09-24 DIAGNOSIS — Z87891 Personal history of nicotine dependence: Secondary | ICD-10-CM | POA: Diagnosis not present

## 2014-09-24 DIAGNOSIS — R1030 Lower abdominal pain, unspecified: Secondary | ICD-10-CM | POA: Diagnosis not present

## 2014-09-24 DIAGNOSIS — N400 Enlarged prostate without lower urinary tract symptoms: Secondary | ICD-10-CM | POA: Diagnosis present

## 2014-09-24 DIAGNOSIS — Z993 Dependence on wheelchair: Secondary | ICD-10-CM | POA: Diagnosis not present

## 2014-09-24 DIAGNOSIS — K921 Melena: Secondary | ICD-10-CM | POA: Insufficient documentation

## 2014-09-24 DIAGNOSIS — J80 Acute respiratory distress syndrome: Secondary | ICD-10-CM | POA: Diagnosis not present

## 2014-09-24 DIAGNOSIS — J9601 Acute respiratory failure with hypoxia: Secondary | ICD-10-CM | POA: Diagnosis not present

## 2014-09-24 DIAGNOSIS — A419 Sepsis, unspecified organism: Secondary | ICD-10-CM | POA: Diagnosis not present

## 2014-09-24 DIAGNOSIS — I4891 Unspecified atrial fibrillation: Secondary | ICD-10-CM | POA: Diagnosis present

## 2014-09-24 DIAGNOSIS — T1490XA Injury, unspecified, initial encounter: Secondary | ICD-10-CM

## 2014-09-24 DIAGNOSIS — F329 Major depressive disorder, single episode, unspecified: Secondary | ICD-10-CM | POA: Diagnosis present

## 2014-09-24 DIAGNOSIS — Z7982 Long term (current) use of aspirin: Secondary | ICD-10-CM | POA: Diagnosis not present

## 2014-09-24 DIAGNOSIS — Z9889 Other specified postprocedural states: Secondary | ICD-10-CM | POA: Diagnosis not present

## 2014-09-24 DIAGNOSIS — E43 Unspecified severe protein-calorie malnutrition: Secondary | ICD-10-CM | POA: Diagnosis not present

## 2014-09-24 DIAGNOSIS — I481 Persistent atrial fibrillation: Secondary | ICD-10-CM | POA: Diagnosis not present

## 2014-09-24 DIAGNOSIS — D638 Anemia in other chronic diseases classified elsewhere: Secondary | ICD-10-CM | POA: Diagnosis present

## 2014-09-24 DIAGNOSIS — E46 Unspecified protein-calorie malnutrition: Secondary | ICD-10-CM | POA: Diagnosis not present

## 2014-09-24 DIAGNOSIS — R6521 Severe sepsis with septic shock: Secondary | ICD-10-CM | POA: Diagnosis not present

## 2014-09-24 DIAGNOSIS — I2510 Atherosclerotic heart disease of native coronary artery without angina pectoris: Secondary | ICD-10-CM | POA: Diagnosis present

## 2014-09-24 DIAGNOSIS — Z681 Body mass index (BMI) 19 or less, adult: Secondary | ICD-10-CM | POA: Diagnosis not present

## 2014-09-24 DIAGNOSIS — K254 Chronic or unspecified gastric ulcer with hemorrhage: Secondary | ICD-10-CM | POA: Diagnosis not present

## 2014-09-24 DIAGNOSIS — Z7901 Long term (current) use of anticoagulants: Secondary | ICD-10-CM | POA: Diagnosis not present

## 2014-09-24 DIAGNOSIS — Z950 Presence of cardiac pacemaker: Secondary | ICD-10-CM | POA: Diagnosis not present

## 2014-09-24 DIAGNOSIS — K2970 Gastritis, unspecified, without bleeding: Secondary | ICD-10-CM | POA: Diagnosis present

## 2014-09-24 DIAGNOSIS — K922 Gastrointestinal hemorrhage, unspecified: Secondary | ICD-10-CM | POA: Diagnosis not present

## 2014-09-24 LAB — COMPREHENSIVE METABOLIC PANEL
ALT: 15 U/L (ref 0–53)
AST: 18 U/L (ref 0–37)
Albumin: 3.2 g/dL — ABNORMAL LOW (ref 3.5–5.2)
Alkaline Phosphatase: 98 U/L (ref 39–117)
Anion gap: 8 (ref 5–15)
BUN: 19 mg/dL (ref 6–23)
CO2: 28 mmol/L (ref 19–32)
Calcium: 9 mg/dL (ref 8.4–10.5)
Chloride: 103 mmol/L (ref 96–112)
Creatinine, Ser: 0.75 mg/dL (ref 0.50–1.35)
GFR calc Af Amer: >90 mL/min (ref >90)
GFR calc non Af Amer: >90 mL/min (ref >90)
Glucose, Bld: 94 mg/dL (ref 70–99)
Potassium: 3.8 mmol/L (ref 3.5–5.1)
Sodium: 139 mmol/L (ref 135–145)
Total Bilirubin: 0.3 mg/dL (ref 0.3–1.2)
Total Protein: 6.4 g/dL (ref 6.0–8.3)

## 2014-09-24 LAB — CBC WITH DIFFERENTIAL/PLATELET
Basophils Absolute: 0 10*3/uL (ref 0.0–0.1)
Basophils Relative: 0 % (ref 0–1)
Eosinophils Absolute: 0.1 10*3/uL (ref 0.0–0.7)
Eosinophils Relative: 2 % (ref 0–5)
HCT: 32.6 % — ABNORMAL LOW (ref 39.0–52.0)
Hemoglobin: 10.5 g/dL — ABNORMAL LOW (ref 13.0–17.0)
Lymphocytes Relative: 39 % (ref 12–46)
Lymphs Abs: 2.3 10*3/uL (ref 0.7–4.0)
MCH: 31.6 pg (ref 26.0–34.0)
MCHC: 32.2 g/dL (ref 30.0–36.0)
MCV: 98.2 fL (ref 78.0–100.0)
Monocytes Absolute: 0.4 10*3/uL (ref 0.1–1.0)
Monocytes Relative: 7 % (ref 3–12)
Neutro Abs: 3.1 10*3/uL (ref 1.7–7.7)
Neutrophils Relative %: 52 % (ref 43–77)
Platelets: 138 10*3/uL — ABNORMAL LOW (ref 150–400)
RBC: 3.32 MIL/uL — ABNORMAL LOW (ref 4.22–5.81)
RDW: 13.9 % (ref 11.5–15.5)
WBC: 6 10*3/uL (ref 4.0–10.5)

## 2014-09-24 LAB — CBC
HCT: 32.4 % — ABNORMAL LOW (ref 39.0–52.0)
HEMOGLOBIN: 10.4 g/dL — AB (ref 13.0–17.0)
MCH: 31.4 pg (ref 26.0–34.0)
MCHC: 32.1 g/dL (ref 30.0–36.0)
MCV: 97.9 fL (ref 78.0–100.0)
Platelets: 131 10*3/uL — ABNORMAL LOW (ref 150–400)
RBC: 3.31 MIL/uL — ABNORMAL LOW (ref 4.22–5.81)
RDW: 13.9 % (ref 11.5–15.5)
WBC: 5.5 10*3/uL (ref 4.0–10.5)

## 2014-09-24 LAB — POC OCCULT BLOOD, ED: Fecal Occult Bld: POSITIVE — AB

## 2014-09-24 MED ORDER — SERTRALINE HCL 50 MG PO TABS
50.0000 mg | ORAL_TABLET | Freq: Every morning | ORAL | Status: DC
  Administered 2014-09-25: 50 mg via ORAL
  Filled 2014-09-24: qty 1

## 2014-09-24 MED ORDER — TRAMADOL HCL 50 MG PO TABS: 50.0000 mg | ORAL_TABLET | Freq: Four times a day (QID) | ORAL | Status: DC | PRN

## 2014-09-24 MED ORDER — ACETAMINOPHEN 325 MG PO TABS: 650.0000 mg | ORAL_TABLET | Freq: Four times a day (QID) | ORAL | Status: DC | PRN

## 2014-09-24 MED ORDER — NICOTINE 14 MG/24HR TD PT24
14.0000 mg | MEDICATED_PATCH | Freq: Every day | TRANSDERMAL | Status: DC
  Administered 2014-09-25 – 2014-09-26 (×2): 14 mg via TRANSDERMAL
  Filled 2014-09-24 (×2): qty 1

## 2014-09-24 MED ORDER — SODIUM CHLORIDE 0.9 % IV SOLN
INTRAVENOUS | Status: DC
  Administered 2014-09-24: 19:00:00 via INTRAVENOUS

## 2014-09-24 MED ORDER — TRAMADOL HCL 50 MG PO TABS
50.0000 mg | ORAL_TABLET | Freq: Two times a day (BID) | ORAL | Status: DC
  Administered 2014-09-25 (×3): 50 mg via ORAL
  Filled 2014-09-24 (×3): qty 1

## 2014-09-24 MED ORDER — ONDANSETRON HCL 4 MG/2ML IJ SOLN
4.0000 mg | Freq: Four times a day (QID) | INTRAMUSCULAR | Status: DC | PRN
Start: 2014-09-24 — End: 2014-09-26

## 2014-09-24 MED ORDER — GUAIFENESIN-DM 100-10 MG/5ML PO SYRP
5.0000 mL | ORAL_SOLUTION | ORAL | Status: DC | PRN
  Administered 2014-09-24 – 2014-09-25 (×2): 5 mL via ORAL
  Filled 2014-09-24 (×2): qty 5

## 2014-09-24 MED ORDER — ONDANSETRON HCL 4 MG PO TABS: 4.0000 mg | ORAL_TABLET | Freq: Four times a day (QID) | ORAL | Status: DC | PRN

## 2014-09-24 MED ORDER — PANTOPRAZOLE SODIUM 40 MG IV SOLR
40.0000 mg | Freq: Two times a day (BID) | INTRAVENOUS | Status: DC
  Administered 2014-09-25 (×3): 40 mg via INTRAVENOUS
  Filled 2014-09-24 (×6): qty 40

## 2014-09-24 MED ORDER — ALBUTEROL SULFATE (2.5 MG/3ML) 0.083% IN NEBU: 2.5000 mg | INHALATION_SOLUTION | RESPIRATORY_TRACT | Status: DC | PRN

## 2014-09-24 MED ORDER — DOCUSATE SODIUM 100 MG PO CAPS
100.0000 mg | ORAL_CAPSULE | Freq: Two times a day (BID) | ORAL | Status: DC
  Administered 2014-09-25 (×3): 100 mg via ORAL
  Filled 2014-09-24 (×3): qty 1

## 2014-09-24 MED ORDER — ZOLPIDEM TARTRATE 5 MG PO TABS
5.0000 mg | ORAL_TABLET | Freq: Every day | ORAL | Status: DC
  Administered 2014-09-25 (×2): 5 mg via ORAL
  Filled 2014-09-24 (×2): qty 1

## 2014-09-24 MED ORDER — MAGNESIUM OXIDE 400 (241.3 MG) MG PO TABS
400.0000 mg | ORAL_TABLET | Freq: Two times a day (BID) | ORAL | Status: DC
  Administered 2014-09-25 (×2): 400 mg via ORAL
  Filled 2014-09-24 (×2): qty 1

## 2014-09-24 MED ORDER — CARVEDILOL 12.5 MG PO TABS
12.5000 mg | ORAL_TABLET | Freq: Two times a day (BID) | ORAL | Status: DC
  Administered 2014-09-24 – 2014-09-25 (×3): 12.5 mg via ORAL
  Filled 2014-09-24 (×3): qty 1

## 2014-09-24 MED ORDER — ACETAMINOPHEN 650 MG RE SUPP: 650.0000 mg | Freq: Four times a day (QID) | RECTAL | Status: DC | PRN

## 2014-09-24 MED ORDER — PRAVASTATIN SODIUM 20 MG PO TABS
10.0000 mg | ORAL_TABLET | Freq: Every day | ORAL | Status: DC
  Administered 2014-09-25 (×2): 10 mg via ORAL
  Filled 2014-09-24 (×2): qty 1

## 2014-09-24 MED ORDER — HYDROCERIN EX CREA
TOPICAL_CREAM | Freq: Two times a day (BID) | CUTANEOUS | Status: DC
  Administered 2014-09-25: 01:00:00 via TOPICAL
  Administered 2014-09-25: 1 via TOPICAL
  Administered 2014-09-25 – 2014-09-26 (×2): via TOPICAL
  Filled 2014-09-24: qty 113

## 2014-09-24 MED ORDER — DOXAZOSIN MESYLATE 4 MG PO TABS
4.0000 mg | ORAL_TABLET | Freq: Every day | ORAL | Status: DC
  Administered 2014-09-25: 4 mg via ORAL
  Filled 2014-09-24 (×2): qty 1

## 2014-09-24 MED ORDER — FINASTERIDE 5 MG PO TABS
5.0000 mg | ORAL_TABLET | Freq: Every day | ORAL | Status: DC
  Administered 2014-09-25: 5 mg via ORAL
  Filled 2014-09-24: qty 1

## 2014-09-24 NOTE — H&P (Addendum)
PATIENT DETAILS Name: [name] Age: 69 y.o. Sex: male Date of Birth: 1946/02/02 Admit Date: 09/24/2014 [id number], Noah Delaine, MD   CHIEF COMPLAINT:  Dark stools 2-3 times since this late morning.  HPI: [name] is a 69 y.o. male with a Past Medical History of atrial fibrillation on chronic anticoagulation with Xarelto, history of complete heart block status post pacemaker implantation, history of CAD, nonambulatory-mostly bed to wheelchair bound who presents from his skilled nursing facility for evaluation of black tarry stools mixed with fresh blood since this morning. Per patient, he has had 2-3 bowel movements today that mostly dark-describes it as black, with very little amount of red blood. He denies any abdominal pain. He denies any hematemesis. He was sent from his skilled nursing facility to the emergency room for further evaluation. In the emergency room, rectal exam done by ED PA showed dark stools, stools were also positive for Hemoccult. Patient claims to have had a history of peptic ulcer disease in the remote past. At this time, patient denies any headache, fever, chest pain, shortness of breath, nausea, vomiting or diarrhea. Note-approximately 1 week back, patient claims a door slammed on his right hand, ever since then he's had worsening pain in his right fifth and fourth finger. Claims his hand was significantly swollen and has improved since then. Patient is now being admitted for further evaluation and treatment.   ALLERGIES:  No Known Allergies  PAST MEDICAL HISTORY: Past Medical History  Diagnosis Date  . Depression   . Hypomagnesemia   . Dyspepsia   . Fall at nursing home 05/27/2013    "slipped on water; said I broke my right hip" (05/27/2013)  . Migraine     "used to have them bad; haven't had one in awhile" (05/27/2013)  . Gout attack     "not too long ago; in my right foot" (05/27/2013)  . Complete heart block     reason for  pacemaker/notes 05/27/2013  . Atrial fibrillation     /notes 05/27/2013    PAST SURGICAL HISTORY: Past Surgical History  Procedure Laterality Date  . Insert / replace / remove pacemaker    . Appendectomy    . Cholecystectomy    . Open reduction of hip Right 05/28/2013    Procedure: OPEN REDUCTION OF HIP;  Surgeon: Renette Butters, MD;  Location: [facility];  Service: Orthopedics;  Laterality: Right;    MEDICATIONS AT HOME: Prior to Admission medications   Medication Sig Start Date End Date Taking? Authorizing Provider  aspirin 81 MG chewable tablet Chew 81 mg by mouth daily.   Yes Historical Provider, MD  Calcium Carbonate-Vit D-Min (CALTRATE 600+D PLUS MINERALS) 600-800 MG-UNIT TABS Take 1 tablet by mouth daily.   Yes Historical Provider, MD  carvedilol (COREG) 12.5 MG tablet Take 12.5 mg by mouth 2 (two) times daily with a meal.   Yes Historical Provider, MD  docusate sodium (COLACE) 100 MG capsule Take 1 capsule (100 mg total) by mouth 2 (two) times daily. Continue this while taking narcotics to help with bowel movements 05/28/13  Yes Renette Butters, MD  doxazosin (CARDURA) 4 MG tablet Take 4 mg by mouth every morning.   Yes Historical Provider, MD  finasteride (PROSCAR) 5 MG tablet Take 5 mg by mouth every morning.   Yes Historical Provider, MD  lisinopril (PRINIVIL,ZESTRIL) 5 MG tablet Take 5 mg by mouth every morning.   Yes Historical Provider, MD  magnesium  oxide (MAG-OX) 400 MG tablet Take 400 mg by mouth 2 (two) times daily.   Yes Historical Provider, MD  pravastatin (PRAVACHOL) 10 MG tablet Take 10 mg by mouth at bedtime.   Yes Historical Provider, MD  Rivaroxaban (XARELTO) 20 MG TABS tablet Take 20 mg by mouth every morning.    Yes Historical Provider, MD  sertraline (ZOLOFT) 50 MG tablet Take 50 mg by mouth every morning.    Yes Historical Provider, MD  traMADol (ULTRAM) 50 MG tablet Take 50 mg by mouth 2 (two) times daily.   Yes Historical Provider, MD  zolpidem (AMBIEN) 5 MG  tablet Take 5 mg by mouth at bedtime.   Yes Historical Provider, MD  traMADol (ULTRAM) 50 MG tablet Take 1 tablet (50 mg total) by mouth every 6 (six) hours as needed. Patient not taking: Reported on 09/24/2014 09/22/14   Lajean Saver, MD    FAMILY HISTORY: Mother-colon cancer  SOCIAL HISTORY:  reports that he has quit smoking. His smoking use included Cigarettes. He has a 15.25 pack-year smoking history. He has quit using smokeless tobacco. His smokeless tobacco use included Snuff and Chew. He reports that he drinks alcohol. His drug history is not on file.  REVIEW OF SYSTEMS:  Constitutional:   No  weight loss, night sweats,  Fevers, chills, fatigue.  HEENT:    No headaches, Difficulty swallowing,Tooth/dental problems,Sore throat  Cardio-vascular: No chest pain,  Orthopnea, PND, swelling in lower extremities, anasarca  GI:  No heartburn, indigestion, abdominal pain, nausea, vomiting, diarrhea  Resp: No shortness of breath with exertion or at rest.  No excess mucus, no productive cough, No non-productive cough,  No coughing up of blood.No change in color of mucus  Skin:  no rash or lesions.  GU:  no dysuria, change in color of urine, no urgency or frequency.  No flank pain.  Musculoskeletal: No joint pain or swelling.  No decreased range of motion.  No back pain.  Psych: No change in mood or affect. No depression or anxiety.  No memory loss.   PHYSICAL EXAM: Blood pressure 133/53, pulse 60, temperature 98.7 F (37.1 C), temperature source Oral, resp. rate 14, SpO2 97 %.  General appearance :Awake, alert, not in any distress. Speech Clear. Not toxic Looking HEENT: Atraumatic and Normocephalic, pupils equally reactive to light and accomodation Neck: supple, no JVD. No cervical lymphadenopathy.  Chest:Good air entry bilaterally, no added sounds  CVS: S1 S2 regular, no murmurs.  Abdomen: Bowel sounds present, Non tender and not distended with no gaurding, rigidity or  rebound. Extremities: B/L Lower Ext shows no edema, both legs are warm to touch. Significant muscle wasting observed in bilateral lower extremities. Multiple scratches/small cuts seen in the dorsum of right hand Neurology: Awake alert, and oriented X 3, CN II-XII intact, Non focal Skin:No Rash Wounds:N/A  LABS ON ADMISSION:   Recent Labs  09/21/14 2150 09/24/14 1412  NA 140 139  K 3.8 3.8  CL 106 103  CO2 25 28  GLUCOSE 102* 94  BUN 24* 19  CREATININE 0.63 0.75  CALCIUM 8.6 9.0    Recent Labs  09/21/14 2150 09/24/14 1412  AST 19 18  ALT 15 15  ALKPHOS 94 98  BILITOT 0.5 0.3  PROT 5.9* 6.4  ALBUMIN 3.2* 3.2*   No results for input(s): LIPASE, AMYLASE in the last 72 hours.  Recent Labs  09/21/14 2150 09/24/14 1412  WBC 5.4 6.0  NEUTROABS  --  3.1  HGB 10.4* 10.5*  HCT 32.2* 32.6*  MCV 97.3 98.2  PLT 135* 138*    Recent Labs  09/21/14 2150  TROPONINI <0.03   No results for input(s): DDIMER in the last 72 hours. Invalid input(s): POCBNP   RADIOLOGIC STUDIES ON ADMISSION: No results found.   EKG: Independently reviewed. Rate controlled A. fib  ASSESSMENT AND PLAN: Present on Admission:  . Upper GI bleed: Presents with black tarry stools, hemodynamically stable. Hemoglobin close to usual baseline. On anticoagulation, hold Xarelto for now. Will place on Protonix 40 mg IV twice a day. Frequent CBCs. Keep nothing by mouth post midnight, have consulted GI (Dr Fuller Plan) for possible endoscopic studies. If has overt/significant bleeding, may need to inform GI overnight.   . Atrial fibrillation: Continue Coreg for rate control, hold Xarelto given concern for above   . DEPRESSION, MAJOR, MODERATE: Continue with Zoloft.   . Hypertension: Continue Coreg and Doxil Zosyn. Hold her lisinopril for now. Follow an adjust accordingly  . BPH: Continue with doxazosin and finasteride. Follow  . Recent right hand injury: Claims her door slammed on his right hand-check  x-ray. Having difficulty moving fourth and fifth fingers.  . Thrombocytopenia: Close to baseline. Follow   . Protein-calorie malnutrition: Nutrition consultation when endoscopic studies have completed   . Chronic ischemic heart disease: Appears to be on both aspirin and anticoagulation, would discontinue aspirin permanently in this patient on discharge.  Further plan will depend as patient's clinical course evolves and further radiologic and laboratory data become available. Patient will be monitored closely.  Above noted plan was discussed with patient, he was in agreement.   DVT Prophylaxis: SCD's  Code Status: Full Code  Disposition Plan: Back to SNF when endoscopic procedures complete   Total time spent for admission equals 45 minutes.  [facility] Pager [phone]  If 7PM-7AM, please contact night-coverage [email or web address] Password [email or web address] 09/24/2014, 5:18 PM

## 2014-09-24 NOTE — ED Provider Notes (Signed)
CSN: [id number]     Arrival date & time 09/24/14  1338 History   First MD Initiated Contact with Patient 09/24/14 1342     Chief Complaint  Patient presents with  . Rectal Bleeding     (Consider location/radiation/quality/duration/timing/severity/associated sxs/prior Treatment) HPI Pt is a 69yo male with hx of migraines, complete heart block, and afib for which he has a pacemaker and is on Xarelto, presenting to ED via EMS from [facility] with reports of 2 episodes of dark stools today.  Pt does have a hx of a stomach ulcer but pt states it has never bled. Denies fever, chills, n/v/d. Denies CP or SOB. Denies abdominal pain or urinary symptoms.    Past Medical History  Diagnosis Date  . Depression   . Hypomagnesemia   . Dyspepsia   . Fall at nursing home 05/27/2013    "slipped on water; said I broke my right hip" (05/27/2013)  . Migraine     "used to have them bad; haven't had one in awhile" (05/27/2013)  . Gout attack     "not too long ago; in my right foot" (05/27/2013)  . Complete heart block     reason for pacemaker/notes 05/27/2013  . Atrial fibrillation     /notes 05/27/2013   Past Surgical History  Procedure Laterality Date  . Insert / replace / remove pacemaker    . Appendectomy    . Cholecystectomy    . Open reduction of hip Right 05/28/2013    Procedure: OPEN REDUCTION OF HIP;  Surgeon: Renette Butters, MD;  Location: [place];  Service: Orthopedics;  Laterality: Right;   No family history on file. History  Substance Use Topics  . Smoking status: Former Smoker -- 0.25 packs/day for 61 years    Types: Cigarettes  . Smokeless tobacco: Former Systems developer    Types: Snuff, Chew     Comment: 05/27/2013 "haven't used chew or snuff in ~ 30 yr"  . Alcohol Use: Yes     Comment: 05/27/2013 "none in years; never had problem w/it"    Review of Systems  Constitutional: Negative for fever, chills, diaphoresis, appetite change and fatigue.  Respiratory: Negative for cough  and shortness of breath.   Cardiovascular: Negative for chest pain, palpitations and leg swelling.  Gastrointestinal: Positive for blood in stool. Negative for nausea, vomiting, abdominal pain, diarrhea and constipation.  Musculoskeletal: Negative for myalgias and back pain.  All other systems reviewed and are negative.     Allergies  Review of patient's allergies indicates no known allergies.  Home Medications   Prior to Admission medications   Medication Sig Start Date End Date Taking? Authorizing Provider  aspirin 81 MG chewable tablet Chew 81 mg by mouth daily.   Yes Historical Provider, MD  Calcium Carbonate-Vit D-Min (CALTRATE 600+D PLUS MINERALS) 600-800 MG-UNIT TABS Take 1 tablet by mouth daily.   Yes Historical Provider, MD  carvedilol (COREG) 12.5 MG tablet Take 12.5 mg by mouth 2 (two) times daily with a meal.   Yes Historical Provider, MD  docusate sodium (COLACE) 100 MG capsule Take 1 capsule (100 mg total) by mouth 2 (two) times daily. Continue this while taking narcotics to help with bowel movements 05/28/13  Yes Renette Butters, MD  doxazosin (CARDURA) 4 MG tablet Take 4 mg by mouth every morning.   Yes Historical Provider, MD  finasteride (PROSCAR) 5 MG tablet Take 5 mg by mouth every morning.   Yes Historical Provider, MD  lisinopril (PRINIVIL,ZESTRIL)  5 MG tablet Take 5 mg by mouth every morning.   Yes Historical Provider, MD  magnesium oxide (MAG-OX) 400 MG tablet Take 400 mg by mouth 2 (two) times daily.   Yes Historical Provider, MD  pravastatin (PRAVACHOL) 10 MG tablet Take 10 mg by mouth at bedtime.   Yes Historical Provider, MD  Rivaroxaban (XARELTO) 20 MG TABS tablet Take 20 mg by mouth every morning.    Yes Historical Provider, MD  sertraline (ZOLOFT) 50 MG tablet Take 50 mg by mouth every morning.    Yes Historical Provider, MD  traMADol (ULTRAM) 50 MG tablet Take 50 mg by mouth 2 (two) times daily.   Yes Historical Provider, MD  zolpidem (AMBIEN) 5 MG tablet  Take 5 mg by mouth at bedtime.   Yes Historical Provider, MD  traMADol (ULTRAM) 50 MG tablet Take 1 tablet (50 mg total) by mouth every 6 (six) hours as needed. Patient not taking: Reported on 09/24/2014 09/22/14   Lajean Saver, MD   BP 133/53 mmHg  Pulse 60  Temp(Src) 98.7 F (37.1 C) (Oral)  Resp 14  SpO2 97% Physical Exam  Constitutional: He appears well-developed and well-nourished.  HENT:  Head: Normocephalic and atraumatic.  Eyes: Conjunctivae are normal. No scleral icterus.  Neck: Normal range of motion. Neck supple.  Cardiovascular: Normal rate, regular rhythm and normal heart sounds.   Regular rate and rhythm on exam   Pulmonary/Chest: Effort normal and breath sounds normal. No respiratory distress. He has no wheezes. He has no rales. He exhibits no tenderness.  Abdominal: Soft. Bowel sounds are normal. He exhibits no distension and no mass. There is no tenderness. There is no rebound and no guarding.  Genitourinary: Guaiac positive stool.  Chaperoned exam. Normal sphincter tone. No frank red blood on exam. Dark soft stool on exam glove.   Musculoskeletal: Normal range of motion.  Neurological: He is alert.  Skin: Skin is warm and dry.  Nursing note and vitals reviewed.   ED Course  Procedures (including critical care time) Labs Review Labs Reviewed  COMPREHENSIVE METABOLIC PANEL - Abnormal; Notable for the following:    Albumin 3.2 (*)    All other components within normal limits  CBC WITH DIFFERENTIAL/PLATELET - Abnormal; Notable for the following:    RBC 3.32 (*)    Hemoglobin 10.5 (*)    HCT 32.6 (*)    Platelets 138 (*)    All other components within normal limits  POC OCCULT BLOOD, ED - Abnormal; Notable for the following:    Fecal Occult Bld POSITIVE (*)    All other components within normal limits  OCCULT BLOOD X 1 CARD TO LAB, STOOL    Imaging Review No results found.   EKG Interpretation   Date/Time:  Saturday September 24 2014 13:53:47  EDT Ventricular Rate:  75 PR Interval:    QRS Duration: 105 QT Interval:  438 QTC Calculation: 489 R Axis:   104 Text Interpretation:  Atrial fibrillation Anteroseptal infarct, age  indeterminate Similar to prior No significant change since last tracing  Confirmed by Mingo Amber  MD, [place] [phone]) on 09/24/2014 3:15:33 PM      MDM   Final diagnoses:  Melena   Pt is a 69yo male presenting to ED with reports of 2 episodes of melena. No other concerns or complaints. Pt appears well, non-toxic, is afebrile. Abdomen is soft, non-tender. No frank red blood on rectal exam, Hemoccult: positive. Labs: consistent with pt's baseline.  Discussed pt with Dr. Mingo Amber  who also examined pt. Recommends admitting pt as he is on xarelto. Pt is hemodynamically stable.  Consulted with Dr. Sloan Leiter, [facility]. Will admit to med-surg bed. Temporary admission orders placed.     Noland Fordyce, PA-C 09/24/14 [place], MD 09/26/14 [phone]

## 2014-09-24 NOTE — ED Notes (Addendum)
Per EMS, pt coming from [facility], pt reports two dark stools today. Pt has an hx of ulcer. NAD at this time. Pt alert to self, place and situation, Pt disoriented to year. Pt denies pain at this time. Pt is on blood thinners.

## 2014-09-25 DIAGNOSIS — K921 Melena: Principal | ICD-10-CM

## 2014-09-25 LAB — CBC
HCT: 31.5 % — ABNORMAL LOW (ref 39.0–52.0)
HCT: 32.6 % — ABNORMAL LOW (ref 39.0–52.0)
HEMATOCRIT: 31.5 % — AB (ref 39.0–52.0)
HEMOGLOBIN: 10 g/dL — AB (ref 13.0–17.0)
Hemoglobin: 10 g/dL — ABNORMAL LOW (ref 13.0–17.0)
Hemoglobin: 10.4 g/dL — ABNORMAL LOW (ref 13.0–17.0)
MCH: 31.3 pg (ref 26.0–34.0)
MCH: 31.2 pg (ref 26.0–34.0)
MCH: 31.7 pg (ref 26.0–34.0)
MCHC: 31.7 g/dL (ref 30.0–36.0)
MCHC: 31.7 g/dL (ref 30.0–36.0)
MCHC: 31.9 g/dL (ref 30.0–36.0)
MCV: 98.4 fL (ref 78.0–100.0)
MCV: 98.1 fL (ref 78.0–100.0)
MCV: 99.4 fL (ref 78.0–100.0)
PLATELETS: 135 10*3/uL — AB (ref 150–400)
PLATELETS: 136 10*3/uL — AB (ref 150–400)
Platelets: 129 10*3/uL — ABNORMAL LOW (ref 150–400)
RBC: 3.2 MIL/uL — ABNORMAL LOW (ref 4.22–5.81)
RBC: 3.21 MIL/uL — ABNORMAL LOW (ref 4.22–5.81)
RBC: 3.28 MIL/uL — ABNORMAL LOW (ref 4.22–5.81)
RDW: 13.9 % (ref 11.5–15.5)
RDW: 13.8 % (ref 11.5–15.5)
RDW: 13.7 % (ref 11.5–15.5)
WBC: 5.1 10*3/uL (ref 4.0–10.5)
WBC: 5.8 10*3/uL (ref 4.0–10.5)
WBC: 5.9 10*3/uL (ref 4.0–10.5)

## 2014-09-25 LAB — BASIC METABOLIC PANEL
Anion gap: 7 (ref 5–15)
BUN: 16 mg/dL (ref 6–23)
CHLORIDE: 105 mmol/L (ref 96–112)
CO2: 27 mmol/L (ref 19–32)
CREATININE: 0.65 mg/dL (ref 0.50–1.35)
Calcium: 8.7 mg/dL (ref 8.4–10.5)
GFR calc Af Amer: >90 mL/min (ref >90)
GFR calc non Af Amer: >90 mL/min (ref >90)
Glucose, Bld: 109 mg/dL — ABNORMAL HIGH (ref 70–99)
Potassium: 4.1 mmol/L (ref 3.5–5.1)
SODIUM: 139 mmol/L (ref 135–145)

## 2014-09-25 NOTE — Progress Notes (Signed)
Pt still has some blood in stools.  At 0615 pt was found in bathroom. Connection hub had come off of iv catheter and pt was bleeding all over the place. Charge RN Hui estimated blood loss of 150-200 cc's. Pt stable and back in bed. Day nurse advised of need for IV access.

## 2014-09-25 NOTE — Progress Notes (Addendum)
PATIENT DETAILS Name: [name] Age: 69 y.o. Sex: male Date of Birth: 1945-07-13 Admit Date: 09/24/2014 Admitting Physician Evalee Mutton Kristeen Mans, MD QPR:[id number], Noah Delaine, MD  Subjective: Claims his stools were "normal color" this morning, however per RN-some blood in stools seen this am as well.   Assessment/Plan: Active Problems: GI bleeding: initially thought to be upper given dark stools-now not so sure as he has hematochezia. Hb remains stable, Xarelto discontinued, await GI input for endoscopic studies. Keep NPO till seen by GI.   Atrial fibrillation: Continue Coreg for rate control, continue to hold Xarelto given above   Anemia:likely secondary to chronic disease, Hb remains close to usual baseline.   DEPRESSION, MAJOR, MODERATE: Continue with Zoloft.   Recent right hand injury: Claims her door slammed on his right hand-a week back-Xray neg for fracture-but shows foreign bodies-will discuss with Ortho  Addendum:  Spoke with Hand surgeon over the phone-Dr Aileen Fass reviewed Xray-and suggested outpatient follow up with him. Did not recommend any other measures while inpatient.   Thrombocytopenia: Chronic,Close to baseline. Follow   BPH: Continue with doxazosin and finasteride. Follow  Protein-calorie malnutrition: Nutrition consultation when endoscopic studies have completed   Chronic ischemic heart disease: Appears to be on both aspirin and anticoagulation, would discontinue aspirin permanently in this patient on discharge.  Disposition: Remain inpatient-SNF when work complete  Antibiotics:  None   Anti-infectives    None      DVT Prophylaxis: SCD's  Code Status: Full code   Family Communication None at bedside  Procedures:  None  CONSULTS:  GI  Time spent 40 minutes-which includes 50% of the time with face-to-face with patient/ family and coordinating care related to the above assessment and plan.  MEDICATIONS: Scheduled Meds: .  carvedilol  12.5 mg Oral BID WC  . docusate sodium  100 mg Oral BID  . doxazosin  4 mg Oral Daily  . finasteride  5 mg Oral Daily  . hydrocerin   Topical BID  . magnesium oxide  400 mg Oral BID  . nicotine  14 mg Transdermal Daily  . pantoprazole (PROTONIX) IV  40 mg Intravenous Q12H  . pravastatin  10 mg Oral QHS  . sertraline  50 mg Oral q morning - 10a  . traMADol  50 mg Oral BID  . zolpidem  5 mg Oral QHS   Continuous Infusions: . sodium chloride 75 mL/hr at 09/24/14 1846   PRN Meds:.acetaminophen **OR** acetaminophen, albuterol, guaiFENesin-dextromethorphan, ondansetron **OR** ondansetron (ZOFRAN) IV    PHYSICAL EXAM: Vital signs in last 24 hours: Filed Vitals:   09/24/14 1715 09/24/14 1753 09/24/14 2112 09/25/14 0455  BP: 114/57 149/62 134/54 126/76  Pulse:  69 55 66  Temp:  98.6 F (37 C) 97.8 F (36.6 C) 98.4 F (36.9 C)  TempSrc:   Oral Oral  Resp: 14 16 16 16   SpO2: 97% 100% 99% 97%    Weight change:  There were no vitals filed for this visit. There is no weight on file to calculate BMI.   Gen Exam: Awake and alert with clear speech.   Neck: Supple, No JVD.   Chest: B/L Clear.   CVS: S1 S2 Regular, no murmurs.  Abdomen: soft, BS +, non tender, non distended.  Extremities: no edema, lower extremities warm to touch.Multiple scratches/small cuts seen in the dorsum of right hand-with no major swelling. Neurologic: Non Focal.   Skin: No Rash.   Wounds: N/A.  Intake/Output from previous day:  Intake/Output Summary (Last 24 hours) at 09/25/14 0918 Last data filed at 09/24/14 2054  Gross per 24 hour  Intake    120 ml  Output      0 ml  Net    120 ml     LAB RESULTS: CBC  Recent Labs Lab 09/21/14 2150 09/24/14 1412 09/24/14 1831 09/25/14 0115  WBC 5.4 6.0 5.5 5.1  HGB 10.4* 10.5* 10.4* 10.0*  HCT 32.2* 32.6* 32.4* 31.5*  PLT 135* 138* 131* 129*  MCV 97.3 98.2 97.9 98.4  MCH 31.4 31.6 31.4 31.3  MCHC 32.3 32.2 32.1 31.7  RDW 13.8 13.9 13.9  13.9  LYMPHSABS  --  2.3  --   --   MONOABS  --  0.4  --   --   EOSABS  --  0.1  --   --   BASOSABS  --  0.0  --   --     Chemistries   Recent Labs Lab 09/21/14 2150 09/24/14 1412 09/25/14 0115  NA 140 139 139  K 3.8 3.8 4.1  CL 106 103 105  CO2 25 28 27   GLUCOSE 102* 94 109*  BUN 24* 19 16  CREATININE 0.63 0.75 0.65  CALCIUM 8.6 9.0 8.7    CBG: No results for input(s): GLUCAP in the last 168 hours.  GFR CrCl cannot be calculated (Unknown ideal weight.).  Coagulation profile No results for input(s): INR, PROTIME in the last 168 hours.  Cardiac Enzymes  Recent Labs Lab 09/21/14 2150  TROPONINI <0.03    Invalid input(s): POCBNP No results for input(s): DDIMER in the last 72 hours. No results for input(s): HGBA1C in the last 72 hours. No results for input(s): CHOL, HDL, LDLCALC, TRIG, CHOLHDL, LDLDIRECT in the last 72 hours. No results for input(s): TSH, T4TOTAL, T3FREE, THYROIDAB in the last 72 hours.  Invalid input(s): FREET3 No results for input(s): VITAMINB12, FOLATE, FERRITIN, TIBC, IRON, RETICCTPCT in the last 72 hours. No results for input(s): LIPASE, AMYLASE in the last 72 hours.  Urine Studies No results for input(s): UHGB, CRYS in the last 72 hours.  Invalid input(s): UACOL, UAPR, USPG, UPH, UTP, UGL, UKET, UBIL, UNIT, UROB, ULEU, UEPI, UWBC, URBC, UBAC, CAST, UCOM, BILUA  MICROBIOLOGY: No results found for this or any previous visit (from the past 240 hour(s)).  RADIOLOGY STUDIES/RESULTS: Dg Chest 2 View  09/21/2014   CLINICAL DATA:  Left-sided chest pain for 4 days.  EXAM: CHEST  2 VIEW  COMPARISON:  05/27/2013  FINDINGS: Unchanged positioning of dual-chamber pacer leads from the right. Negative cardiomediastinal silhouette. Streaky right infrahilar opacity. There is no edema, consolidation, effusion, or pneumothorax.  IMPRESSION: 1. No acute findings. 2. Mild scarring or atelectasis in the right infrahilar lung.   Electronically Signed   By:  Monte Fantasia M.D.   On: 09/21/2014 22:29   Dg Hand 2 View Right  09/25/2014   CLINICAL DATA:  Right hand pain. Slammed door on fingers at home last Sunday, persistent pain.  EXAM: RIGHT HAND - 2 VIEW  COMPARISON:  None.  FINDINGS: Diffuse bony under mineralization. No fracture or dislocation. The alignment is maintained. 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 There are multiple, at least 4, small linear densities projecting in the soft tissues of the hand overlying the metacarpals consistent with foreign bodies. These measure up to 7 mm in length.  IMPRESSION: 1. No fracture or dislocation of the right  hand. 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 2. Multiple, at least 4, small linear densities in the soft tissues of the hand consistent with foreign bodies. Acuity is uncertain.   Electronically Signed   By: Jeb Levering M.D.   On: 09/25/2014 01:36    Oren Binet, MD  [facility] Pager:[phone]  If 7PM-7AM, please contact night-coverage [email or web address] Password [email or web address] 09/25/2014, 9:18 AM   LOS: 1 day

## 2014-09-25 NOTE — Consult Note (Signed)
Consultation  Referring Provider: [facility] Primary Care Physician:  [name], MD Primary Gastroenterologist:  Dr. Henrene Pastor remotely  Reason for Consultation:  Melena  HPI: [name] is a 69 y.o. male known very remotely to Dr. Scarlette Shorts. He had an endoscopy in 1994 for dysphagia which was negative. Patient was admitted through the emergency room last night after he had 2 black bowel movements at his nursing home yesterday morning. Patient is being maintained on as a role so and a baby aspirin for history of atrial fibrillation. He also has history of complete heart block status post pacemaker and coronary artery disease. 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 He had hemoglobin check on 09/21/2014 which was 10.4 hemoglobin last evening 10.4 and hemoglobin this morning 10 noted that his platelet count is also low. BUN 9 ,creatinine 0.75. He's been hemodynamically stable. Chart review shows he had an ER visit on 09/21/2014 with complaints of chest pain. Workup was negative and he was discharged. Pt states he has an ulcer and has had it for years. Some burning like fire recently. No PPI on med list. No N/V. No real abdominal pain. He believes he took all his meds yesterday morning. No bleeding since admit.  Past Medical History  Diagnosis Date  . Depression   . Hypomagnesemia   . Dyspepsia   . Fall at nursing home 05/27/2013    "slipped on water; said I broke my right hip" (05/27/2013)  . Migraine     "used to have them bad; haven't had one in awhile" (05/27/2013)  . Gout attack     "not too long ago; in my right foot" (05/27/2013)  . Complete heart block     reason for pacemaker/notes 05/27/2013  . Atrial fibrillation     /notes 05/27/2013    Past Surgical History  Procedure Laterality Date  . Insert / replace / remove pacemaker    . Appendectomy    . Cholecystectomy    . Open reduction of hip  Right 05/28/2013    Procedure: OPEN REDUCTION OF HIP;  Surgeon: Renette Butters, MD;  Location: [place];  Service: Orthopedics;  Laterality: Right;    Prior to Admission medications   Medication Sig Start Date End Date Taking? Authorizing Provider  aspirin 81 MG chewable tablet Chew 81 mg by mouth daily.   Yes Historical Provider, MD  Calcium Carbonate-Vit D-Min (CALTRATE 600+D PLUS MINERALS) 600-800 MG-UNIT TABS Take 1 tablet by mouth daily.   Yes Historical Provider, MD  carvedilol (COREG) 12.5 MG tablet Take 12.5 mg by mouth 2 (two) times daily with a meal.   Yes Historical Provider, MD  docusate sodium (COLACE) 100 MG capsule Take 1 capsule (100 mg total) by mouth 2 (two) times daily. Continue this while taking narcotics to help with bowel movements 05/28/13  Yes Renette Butters, MD  doxazosin (CARDURA) 4 MG tablet Take 4 mg by mouth every morning.   Yes Historical Provider, MD  finasteride (PROSCAR) 5 MG tablet Take 5 mg by mouth every morning.   Yes Historical Provider, MD  lisinopril (PRINIVIL,ZESTRIL) 5 MG tablet Take 5 mg by mouth every morning.   Yes Historical Provider, MD  magnesium oxide (MAG-OX) 400 MG tablet Take 400 mg by mouth 2 (two) times daily.   Yes Historical Provider, MD  pravastatin (PRAVACHOL) 10 MG tablet Take 10 mg by mouth at bedtime.   Yes Historical Provider, MD  Rivaroxaban (XARELTO) 20 MG TABS tablet Take 20 mg by mouth every morning.    Yes Historical Provider, MD  sertraline (ZOLOFT) 50 MG tablet Take 50 mg by mouth every morning.    Yes Historical Provider, MD  traMADol (ULTRAM) 50 MG tablet Take 50 mg by mouth 2 (two) times daily.   Yes Historical Provider, MD  zolpidem (AMBIEN) 5 MG tablet Take 5 mg by mouth at bedtime.   Yes Historical Provider, MD  traMADol (ULTRAM) 50 MG tablet Take 1 tablet (50 mg total) by mouth every 6 (six) hours as needed. Patient not taking: Reported on 09/24/2014 09/22/14   Lajean Saver, MD    Current Facility-Administered  Medications  Medication Dose Route Frequency Provider Last Rate Last Dose  . 0.9 %  sodium chloride infusion   Intravenous Continuous Jonetta Osgood, MD 75 mL/hr at 09/24/14 1846    . acetaminophen (TYLENOL) tablet 650 mg  650 mg Oral Q6H PRN Shanker Kristeen Mans, MD       Or  . acetaminophen (TYLENOL) suppository 650 mg  650 mg Rectal Q6H PRN Shanker Kristeen Mans, MD      . albuterol (PROVENTIL) (2.5 MG/3ML) 0.083% nebulizer solution 2.5 mg  2.5 mg Nebulization Q2H PRN Jonetta Osgood, MD      . carvedilol (COREG) tablet 12.5 mg  12.5 mg Oral BID WC Jonetta Osgood, MD   12.5 mg at 09/24/14 1845  . docusate sodium (COLACE) capsule 100 mg  100 mg Oral BID Jonetta Osgood, MD   100 mg at 09/25/14 0004  . doxazosin (CARDURA) tablet 4 mg  4 mg Oral Daily Shanker Kristeen Mans, MD      . finasteride (PROSCAR) tablet 5 mg  5 mg Oral Daily Shanker Kristeen Mans, MD      . guaiFENesin-dextromethorphan (ROBITUSSIN DM) 100-10 MG/5ML syrup 5 mL  5 mL Oral Q4H PRN Jonetta Osgood, MD   5 mL at 09/24/14 1845  . hydrocerin (EUCERIN) cream   Topical BID Dianne Dun, NP      . magnesium oxide (MAG-OX) tablet 400 mg  400 mg Oral BID Jonetta Osgood, MD   400 mg at 09/25/14 0004  . nicotine (NICODERM CQ - dosed in mg/24 hours) patch 14 mg  14 mg Transdermal Daily Dianne Dun, NP      . ondansetron [facility]) tablet 4 mg  4 mg Oral Q6H PRN Shanker Kristeen Mans, MD       Or  . ondansetron (ZOFRAN) injection 4 mg  4 mg Intravenous Q6H PRN Shanker Kristeen Mans, MD      . pantoprazole (PROTONIX) injection 40 mg  40 mg Intravenous Q12H Jonetta Osgood, MD   40 mg at 09/25/14 0004  . pravastatin (PRAVACHOL) tablet 10 mg  10 mg Oral QHS Jonetta Osgood, MD   10 mg at 09/25/14 0004  . sertraline (ZOLOFT) tablet 50 mg  50 mg Oral q morning - 10a Shanker Kristeen Mans, MD      . traMADol Veatrice Bourbon) tablet 50 mg  50 mg Oral BID Jonetta Osgood, MD   50 mg at 09/25/14 0005  . zolpidem (AMBIEN) tablet 5 mg  5  mg Oral QHS Jonetta Osgood, MD   5 mg at 09/25/14 0005    Allergies as of 09/24/2014  . (No Known Allergies)    No family history on file.  History   Social History  . Marital Status: Divorced    Spouse  Name: N/A  . Number of Children: N/A  . Years of Education: N/A   Occupational History  . Not on file.   Social History Main Topics  . Smoking status: Former Smoker -- 0.25 packs/day for 61 years    Types: Cigarettes  . Smokeless tobacco: Former Systems developer    Types: Snuff, Chew     Comment: 05/27/2013 "haven't used chew or snuff in ~ 30 yr"  . Alcohol Use: Yes     Comment: 05/27/2013 "none in years; never had problem w/it"  . Drug Use: Not on file  . Sexual Activity: No   Other Topics Concern  . Not on file   Social History Narrative    Review of Systems: Pertinent positive and negative review of systems were noted in the above HPI section.  All other review of systems was otherwise negative.Marland Kitchen  Physical Exam: Vital signs in last 24 hours: Temp:  [97.8 F (36.6 C)-98.7 F (37.1 C)] 98.4 F (36.9 C) (04/17 0455) Pulse Rate:  [55-74] 66 (04/17 0455) Resp:  [11-21] 16 (04/17 0455) BP: (107-149)/(51-80) 126/76 mmHg (04/17 0455) SpO2:  [96 %-100 %] 97 % (04/17 0455) Last BM Date: 09/25/14 General:   Alert, well-developed, thin older WM cooperative in NAD, smells of smoke Head:  Normocephalic and atraumatic. Eyes:  Sclera clear, no icterus.   Conjunctiva pink. Ears:  Normal auditory acuity. Nose:  No deformity, discharge,  or lesions. Mouth:  No deformity or lesions.   Neck:  Supple; no masses or thyromegaly. Lungs:  Clear throughout to auscultation.   No wheezes, crackles, or rhonchi.  Heart:  Regular rate and rhythm; no murmurs, clicks, rubs,  or gallops. Abdomen:  Soft,nontender, BS active,nonpalp mass or hsm.   Rectal:  Deferred ,documented melena in ER Msk:  Symmetrical without gross deformities. . Pulses:  Normal pulses noted. Extremities:  Without clubbing  or edema. Neurologic:  Alert and  oriented x4;  grossly normal neurologically. Skin:  Intact without significant lesions or rashes.. Psych:  Alert and cooperative. Normal mood and affect.  Intake/Output from previous day: 04/16 0701 - 04/17 0700 In: 120 [P.O.:120] Out: -  Intake/Output this shift:    Lab Results:  Recent Labs  09/24/14 1412 09/24/14 1831 09/25/14 0115  WBC 6.0 5.5 5.1  HGB 10.5* 10.4* 10.0*  HCT 32.6* 32.4* 31.5*  PLT 138* 131* 129*   BMET  Recent Labs  09/24/14 1412 09/25/14 0115  NA 139 139  K 3.8 4.1  CL 103 105  CO2 28 27  GLUCOSE 94 109*  BUN 19 16  CREATININE 0.75 0.65  CALCIUM 9.0 8.7   LFT  Recent Labs  09/24/14 1412  PROT 6.4  ALBUMIN 3.2*  AST 18  ALT 15  ALKPHOS 98  BILITOT 0.3   PT/INR No results for input(s): LABPROT, INR in the last 72 hours. Hepatitis Panel No results for input(s): HEPBSAG, HCVAB, HEPAIGM, HEPBIGM in the last 72 hours.   IMPRESSION:  #1 69 yo male on aspirin and Xarelto with 2 episodes of melena yesterday. Hemodynamically stable, no bleeding since admit  Hgb stable as compared to 4 days previous Suspect UGI bleed-R/O PUD, gastritis #2 anemia  - acute on chronic , also with thrombocytopenia ? Myelodysplastic component #3 CAD #4 Atrial fib-on baby asa and Xarelto #5 Depression #6 Hx of learning disability, and unspecified psychiatric disorder   PLAN: Hold Xarelto IV PPI BID Serial hgb- transfuse as indicated to keep Hb > 8 Clear liquids today Will schedule for  EGD tomorrow am . Procedure discussed in detail with pt and he is agreeable to proceed.   Amy Esterwood  09/25/2014, 9:53 AM     Attending physician's note   I have taken a history, examined the patient and reviewed the chart. I agree with the Advanced Practitioner's note, impression and recommendations. Acute GI bleed on Xarelto and ASA, likely UGI source. Hold Xarelto and ASA. IV PPI. Monitor Hb. Transfuse to keep Hb > 8. EGD  tomorrow with Dr. Hilarie Fredrickson after Xarelto wash out.   Ladene Artist, MD [facility]

## 2014-09-26 ENCOUNTER — Encounter (HOSPITAL_COMMUNITY)
Admission: EM | Disposition: A | Discharge status: Skilled Nursing Facility | Payer: Self-pay | Source: Home / Self Care | Attending: Internal Medicine

## 2014-09-26 ENCOUNTER — Inpatient Hospital Stay (HOSPITAL_COMMUNITY): Payer: Medicare Other | Admitting: Certified Registered Nurse Anesthetist

## 2014-09-26 ENCOUNTER — Encounter (HOSPITAL_COMMUNITY): Payer: Self-pay | Admitting: *Deleted

## 2014-09-26 DIAGNOSIS — I481 Persistent atrial fibrillation: Secondary | ICD-10-CM

## 2014-09-26 DIAGNOSIS — K921 Melena: Secondary | ICD-10-CM | POA: Insufficient documentation

## 2014-09-26 HISTORY — PX: ESOPHAGOGASTRODUODENOSCOPY (EGD) WITH PROPOFOL: SHX5813

## 2014-09-26 LAB — CBC
HCT: 31.5 % — ABNORMAL LOW (ref 39.0–52.0)
HEMATOCRIT: 30.6 % — AB (ref 39.0–52.0)
HEMOGLOBIN: 10.2 g/dL — AB (ref 13.0–17.0)
Hemoglobin: 9.6 g/dL — ABNORMAL LOW (ref 13.0–17.0)
MCH: 31.1 pg (ref 26.0–34.0)
MCH: 32.3 pg (ref 26.0–34.0)
MCHC: 31.4 g/dL (ref 30.0–36.0)
MCHC: 32.4 g/dL (ref 30.0–36.0)
MCV: 99 fL (ref 78.0–100.0)
MCV: 99.7 fL (ref 78.0–100.0)
PLATELETS: 123 10*3/uL — AB (ref 150–400)
Platelets: 127 10*3/uL — ABNORMAL LOW (ref 150–400)
RBC: 3.09 MIL/uL — ABNORMAL LOW (ref 4.22–5.81)
RBC: 3.16 MIL/uL — ABNORMAL LOW (ref 4.22–5.81)
RDW: 13.8 % (ref 11.5–15.5)
RDW: 13.6 % (ref 11.5–15.5)
WBC: 5.2 10*3/uL (ref 4.0–10.5)
WBC: 5.9 10*3/uL (ref 4.0–10.5)

## 2014-09-26 SURGERY — ESOPHAGOGASTRODUODENOSCOPY (EGD) WITH PROPOFOL
Anesthesia: Monitor Anesthesia Care

## 2014-09-26 MED ORDER — TRAMADOL HCL 50 MG PO TABS: 50.0000 mg | ORAL_TABLET | Freq: Two times a day (BID) | ORAL | Status: DC

## 2014-09-26 MED ORDER — RIVAROXABAN 20 MG PO TABS: 20.0000 mg | ORAL_TABLET | Freq: Every morning | ORAL | Status: DC

## 2014-09-26 MED ORDER — LACTATED RINGERS IV SOLN
INTRAVENOUS | Status: DC
  Administered 2014-09-26: 11:00:00 via INTRAVENOUS

## 2014-09-26 MED ORDER — PEG-KCL-NACL-NASULF-NA ASC-C 100 G PO SOLR: 0.5000 | Freq: Once | ORAL | Status: DC

## 2014-09-26 MED ORDER — PEG-KCL-NACL-NASULF-NA ASC-C 100 G PO SOLR
0.5000 | Freq: Once | ORAL | Status: DC
  Filled 2014-09-26: qty 1

## 2014-09-26 MED ORDER — LACTATED RINGERS IV SOLN
INTRAVENOUS | Status: DC | PRN
Start: 2014-09-26 — End: 2014-09-26
  Administered 2014-09-26: 12:00:00 via INTRAVENOUS

## 2014-09-26 MED ORDER — TRAMADOL HCL 50 MG PO TABS: 50.0000 mg | ORAL_TABLET | Freq: Four times a day (QID) | ORAL | Status: DC | PRN

## 2014-09-26 MED ORDER — PROPOFOL INFUSION 10 MG/ML OPTIME
INTRAVENOUS | Status: DC | PRN
  Administered 2014-09-26: 75 ug/kg/min via INTRAVENOUS

## 2014-09-26 MED ORDER — PNEUMOCOCCAL 13-VAL CONJ VACC IM SUSP: 0.5000 mL | INTRAMUSCULAR | Status: DC

## 2014-09-26 MED ORDER — ONDANSETRON HCL 4 MG/2ML IJ SOLN
INTRAMUSCULAR | Status: DC | PRN
  Administered 2014-09-26: 4 mg via INTRAVENOUS

## 2014-09-26 MED ORDER — BUTAMBEN-TETRACAINE-BENZOCAINE 2-2-14 % EX AERO
INHALATION_SPRAY | CUTANEOUS | Status: DC | PRN
  Administered 2014-09-26: 2 via TOPICAL

## 2014-09-26 MED ORDER — PANTOPRAZOLE SODIUM 40 MG PO TBEC: 40.0000 mg | DELAYED_RELEASE_TABLET | Freq: Every day | ORAL | Status: DC

## 2014-09-26 MED ORDER — PEG-KCL-NACL-NASULF-NA ASC-C 100 G PO SOLR: 1.0000 | Freq: Once | ORAL | Status: DC

## 2014-09-26 NOTE — Op Note (Signed)
[facility] [place], [id number]   ENDOSCOPY PROCEDURE REPORT  PATIENT: Damon, Goodwin  MR#: [id number] BIRTHDATE: Oct 28, 1945 , 52  yrs. old GENDER: male ENDOSCOPIST: Jerene Bears, MD REFERRED BY:  [facility] PROCEDURE DATE:  09/26/2014 PROCEDURE:  EGD, diagnostic ASA CLASS:     Class III INDICATIONS:  melena. MEDICATIONS: Monitored anesthesia care and Per Anesthesia TOPICAL ANESTHETIC: Cetacaine Spray  DESCRIPTION OF PROCEDURE: After the risks benefits and alternatives of the procedure were thoroughly explained, informed consent was obtained.  The Pentax Gastroscope [id number] endoscope was introduced through the mouth and advanced to the second portion of the duodenum , Without limitations.  The instrument was slowly withdrawn as the mucosa was fully examined.   ESOPHAGUS: The mucosa of the esophagus appeared normal.  STOMACH: Mild gastritis (inflammation) was found in the gastric antrum. There was adherent heme present in the stomach without erosion, ulceration seen. No AVM or angiodysplastic lesions seen. No active bleeding.  DUODENUM: Mild duodenal inflammation was found in the duodenal bulb and sweep.   The duodenal mucosa showed no abnormalities in the 2nd part of the duodenum.  Retroflexed views revealed no abnormalities.     The scope was then withdrawn from the patient and the procedure completed.  COMPLICATIONS: There were no immediate complications.  ENDOSCOPIC IMPRESSION: 1.   The mucosa of the esophagus appeared normal 2.   Mild gastritis (inflammation) was found in the gastric antrum 3.   Duodenal inflammation was found in the duodenal bulb and sweep 4.   The duodenal mucosa showed no abnormalities in the 2nd part of the duodenum  RECOMMENDATIONS: 1.  Check H.  pylori serum antibody, treat if positive 2.  Once daily PPI 3.  Proceed with colonoscopy, if patient agreeable, to further evaluate recent melena  (though no significant drop in Hgb)  eSigned:  Jerene Bears, MD 09/26/2014 12:56 PM CC: the patient

## 2014-09-26 NOTE — Progress Notes (Signed)
INITIAL NUTRITION ASSESSMENT  Pt meets criteria for SEVERE MALNUTRITION in the context of chronic illness as evidenced by severe fat and muscle mass loss.  DOCUMENTATION CODES Per approved criteria  -Severe malnutrition in the context of chronic illness -Underweight   INTERVENTION: Pt to be discharged today.  NUTRITION DIAGNOSIS: Malnutrition related to chronic illness as evidenced by severe fat and muscle mass loss.   Goal: Pt to meet >/= 90% of their estimated nutrition needs   Monitor:  Diet advancement, weight trends, labs, I/O's  Reason for Assessment: MD consult for assessment of nutrition requirements/status  69 y.o. male  Admitting Dx: <principal problem not specified>  ASSESSMENT: Pt with a PMH of atrial fibrillation on chronic, history of complete heart block status post pacemaker implantation, history of CAD, nonambulatory-mostly bed to wheelchair bound who presents from his SNF for black tarry stools.  Pt reports having a good appetite currently and PTA eating 3 meals a day with no other difficulties. Pt reports usual body weight of 103 lbs. Pt reports he has weighed 87 lbs, however has gained weight back. Pt does not drink any nutritional supplement as it causes abdominal discomfort. Pt was encouraged to eat his food at meals for adequate nutrition. Pt to be discharged today.  Nutrition Focused Physical Exam:  Subcutaneous Fat:  Orbital Region: N/A Upper Arm Region: Moderate depletion Thoracic and Lumbar Region: Severe depletion  Muscle:  Temple Region: N/A Clavicle Bone Region: Moderate depletion Clavicle and Acromion Bone Region: Moderate depletion Scapular Bone Region: N/A Dorsal Hand: N/A Patellar Region: Severe depletion Anterior Thigh Region: Severe depletion Posterior Calf Region: Severe depletion  Edema: none    Height: Ht Readings from Last 1 Encounters:  05/27/13 5\' 9"  (1.753 m)    Weight: Wt Readings from Last 1 Encounters:  05/31/13  110 lb 6.4 oz (50.077 kg)  09/26/14 103 lbs   Ideal Body Weight: 160 lbs  % Ideal Body Weight: 64%  Wt Readings from Last 10 Encounters:  05/31/13 110 lb 6.4 oz (50.077 kg)  12/24/06 94 lb 0.8 oz (42.661 kg)    Usual Body Weight: 103 lbs per pt report  % Usual Body Weight: ---  BMI:  Body Mass Index: 15.3 kg/(m^2) Underweight  Estimated Nutritional Needs: Kcal: 1700-1900 Protein: 70-90 grams Fluid: 1.7 - 1.9 L/day  Skin: Intact  Diet Order: Diet NPO time specified  EDUCATION NEEDS: -No education needs identified at this time   Intake/Output Summary (Last 24 hours) at 09/26/14 0932 Last data filed at 09/25/14 1700  Gross per 24 hour  Intake    720 ml  Output      0 ml  Net    720 ml    Last BM: 4/17  Labs:   Recent Labs Lab 09/21/14 2150 09/24/14 1412 09/25/14 0115  NA 140 139 139  K 3.8 3.8 4.1  CL 106 103 105  CO2 25 28 27   BUN 24* 19 16  CREATININE 0.63 0.75 0.65  CALCIUM 8.6 9.0 8.7  GLUCOSE 102* 94 109*    CBG (last 3)  No results for input(s): GLUCAP in the last 72 hours.  Scheduled Meds: . carvedilol  12.5 mg Oral BID WC  . docusate sodium  100 mg Oral BID  . doxazosin  4 mg Oral Daily  . finasteride  5 mg Oral Daily  . hydrocerin   Topical BID  . magnesium oxide  400 mg Oral BID  . nicotine  14 mg Transdermal Daily  . pantoprazole (PROTONIX)  IV  40 mg Intravenous Q12H  . [START ON 09/27/2014] pneumococcal 13-valent conjugate vaccine  0.5 mL Intramuscular Tomorrow-1000  . pravastatin  10 mg Oral QHS  . sertraline  50 mg Oral q morning - 10a  . traMADol  50 mg Oral BID  . zolpidem  5 mg Oral QHS    Continuous Infusions: . sodium chloride 75 mL/hr at 09/24/14 1846    Past Medical History  Diagnosis Date  . Depression   . Hypomagnesemia   . Dyspepsia   . Fall at nursing home 05/27/2013    "slipped on water; said I broke my right hip" (05/27/2013)  . Migraine     "used to have them bad; haven't had one in awhile" (05/27/2013)   . Gout attack     "not too long ago; in my right foot" (05/27/2013)  . Complete heart block     reason for pacemaker/notes 05/27/2013  . Atrial fibrillation     /notes 05/27/2013    Past Surgical History  Procedure Laterality Date  . Insert / replace / remove pacemaker    . Appendectomy    . Cholecystectomy    . Open reduction of hip Right 05/28/2013    Procedure: OPEN REDUCTION OF HIP;  Surgeon: Renette Butters, MD;  Location: [facility];  Service: Orthopedics;  Laterality: Right;    Kallie Locks, MS, RD, LDN Pager # [phone] After hours/ weekend pager # [phone]

## 2014-09-26 NOTE — Clinical Social Work Psychosocial (Signed)
Clinical Social Work Department BRIEF PSYCHOSOCIAL ASSESSMENT 09/26/2014  Patient:  [name], [name]     Account Number:  [id number]     Admit date:  09/24/2014  Clinical Social Worker:  Delrae Sawyers  Date/Time:  09/26/2014 03:01 PM  Referred by:  Physician  Date Referred:  09/26/2014 Referred for  SNF Placement   Other Referral:   none.   Interview type:  Patient Other interview type:   none.    PSYCHOSOCIAL DATA Living Status:  FACILITY Admitted from facility:  [facility] Level of care:  Assisted Living Primary support name:  none provided. Primary support relationship to patient:   Degree of support available:   Patient did not provide any information regarding support system.    CURRENT CONCERNS Current Concerns  Post-Acute Placement   Other Concerns:   none.    SOCIAL WORK ASSESSMENT / PLAN CSW received referral patient admitted from facility. (Please see previous note in Epic).    CSW met with patient at bedside regarding discharge disposition. Patient confirmed patient wishes to return to [facility] at time of discharge. Patient presented in good spirits and stated he was hopeful to return home (ALF) as soon as able.    CSW confirmed patient able to return to [facility] once medically stable for discharge.   Assessment/plan status:  Psychosocial Support/Ongoing Assessment of Needs Other assessment/ plan:   none.   Information/referral to community resources:   Patient to return to [facility]F.  FL2 on patient's chart for MD signature.    PATIENT'S/FAMILY'S RESPONSE TO PLAN OF CARE: Patient understanding and agreeable to CSW plan of care. Patient expressed no further questions or concerns at this time.       Lubertha Sayres, [place] Cell: [phone]       Fax: [phone] Clinical Social Work: Orthopedics [phone]) and Surgical [phone])

## 2014-09-26 NOTE — Progress Notes (Signed)
Discussion had with patient after upper endoscopy today. No source for melena found. I have recommended colonoscopy as the next diagnostic step to evaluate recent bleeding. We discussed the risks, benefits and alternatives to his colonoscopy and he does not agree to proceed. He does not feel this is necessary and states "I wont' bleeding again". He understands my recommendation for colonoscopy but still does not wish to proceed Hemoglobin will need to be monitored, resume Xarelto per primary team Follow-up H. pylori and treat if positive Call GI with questions

## 2014-09-26 NOTE — Discharge Summary (Signed)
PATIENT DETAILS Name: [name] Age: 69 y.o. Sex: male Date of Birth: 22-Apr-1946 MRN: [id number]. Admitting Physician: Jonetta Osgood, MD [id number], Noah Delaine, MD  Admit Date: 09/24/2014 Discharge date: 09/26/2014  Recommendations for Outpatient Follow-up:  1. Resume Xarelto from 4/19 2. Check CBC in 1 week 3. If develops anemia or overt GI bleed, we will need referral to gastroenterology for a colonoscopy  PRIMARY DISCHARGE DIAGNOSIS:  Active Problems:   Protein-calorie malnutrition   Thrombocytopenia   DEPRESSION, MAJOR, MODERATE   Chronic ischemic heart disease   Atrial fibrillation   Upper GI bleed   Melena      PAST MEDICAL HISTORY: Past Medical History  Diagnosis Date  . Depression   . Hypomagnesemia   . Dyspepsia   . Fall at nursing home 05/27/2013    "slipped on water; said I broke my right hip" (05/27/2013)  . Migraine     "used to have them bad; Damon't had one in awhile" (05/27/2013)  . Gout attack     "not too long ago; in my right foot" (05/27/2013)  . Complete heart block     reason for pacemaker/notes 05/27/2013  . Atrial fibrillation     /notes 05/27/2013    DISCHARGE MEDICATIONS: Current Discharge Medication List    CONTINUE these medications which have CHANGED   Details  rivaroxaban (XARELTO) 20 MG TABS tablet Take 1 tablet (20 mg total) by mouth every morning. Resume 4/19 Qty: 30 tablet    !! traMADol (ULTRAM) 50 MG tablet Take 1 tablet (50 mg total) by mouth every 6 (six) hours as needed. Qty: 20 tablet, Refills: 0    !! traMADol (ULTRAM) 50 MG tablet Take 1 tablet (50 mg total) by mouth 2 (two) times daily. Qty: 10 tablet, Refills: 0     !! - Potential duplicate medications found. Please discuss with provider.    CONTINUE these medications which have NOT CHANGED   Details  Calcium Carbonate-Vit D-Min (CALTRATE 600+D PLUS MINERALS) 600-800 MG-UNIT TABS Take 1 tablet by mouth daily.    carvedilol (COREG) 12.5 MG tablet Take  12.5 mg by mouth 2 (two) times daily with a meal.    docusate sodium (COLACE) 100 MG capsule Take 1 capsule (100 mg total) by mouth 2 (two) times daily. Continue this while taking narcotics to help with bowel movements Qty: 30 capsule, Refills: 1    doxazosin (CARDURA) 4 MG tablet Take 4 mg by mouth every morning.    finasteride (PROSCAR) 5 MG tablet Take 5 mg by mouth every morning.    lisinopril (PRINIVIL,ZESTRIL) 5 MG tablet Take 5 mg by mouth every morning.    magnesium oxide (MAG-OX) 400 MG tablet Take 400 mg by mouth 2 (two) times daily.    pravastatin (PRAVACHOL) 10 MG tablet Take 10 mg by mouth at bedtime.    sertraline (ZOLOFT) 50 MG tablet Take 50 mg by mouth every morning.     zolpidem (AMBIEN) 5 MG tablet Take 5 mg by mouth at bedtime.      STOP taking these medications     aspirin 81 MG chewable tablet         ALLERGIES:  No Known Allergies  BRIEF HPI:  See H&P, Labs, Consult and Test reports for all details in brief, patient is a 69 year old male resident of a assisted living facility who was admitted for evaluation of black tarry stools.  CONSULTATIONS:   GI  PERTINENT RADIOLOGIC STUDIES: Dg Chest 2 View  09/21/2014  CLINICAL DATA:  Left-sided chest pain for 4 days.  EXAM: CHEST  2 VIEW  COMPARISON:  05/27/2013  FINDINGS: Unchanged positioning of dual-chamber pacer leads from the right. Negative cardiomediastinal silhouette. Streaky right infrahilar opacity. There is no edema, consolidation, effusion, or pneumothorax.  IMPRESSION: 1. No acute findings. 2. Mild scarring or atelectasis in the right infrahilar lung.   Electronically Signed   By: Monte Fantasia M.D.   On: 09/21/2014 22:29   Dg Hand 2 View Right  09/25/2014   CLINICAL DATA:  Right hand pain. Slammed door on fingers at home last Sunday, persistent pain.  EXAM: RIGHT HAND - 2 VIEW  COMPARISON:  None.  FINDINGS: Diffuse bony under mineralization. No fracture or dislocation. The alignment is  maintained. 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 There are multiple, at least 4, small linear densities projecting in the soft tissues of the hand overlying the metacarpals consistent with foreign bodies. These measure up to 7 mm in length.  IMPRESSION: 1. No fracture or dislocation of the right hand. 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 2. Multiple, at least 4, small linear densities in the soft tissues of the hand consistent with foreign bodies. Acuity is uncertain.   Electronically Signed   By: Jeb Levering M.D.   On: 09/25/2014 01:36     PERTINENT LAB RESULTS: CBC:  Recent Labs  09/26/14 0136 09/26/14 1025  WBC 5.2 5.9  HGB 9.6* 10.2*  HCT 30.6* 31.5*  PLT 127* 123*   CMET CMP     Component Value Date/Time   NA 139 09/25/2014 0115   K 4.1 09/25/2014 0115   CL 105 09/25/2014 0115   CO2 27 09/25/2014 0115   GLUCOSE 109* 09/25/2014 0115   BUN 16 09/25/2014 0115   CREATININE 0.65 09/25/2014 0115   CALCIUM 8.7 09/25/2014 0115   PROT 6.4 09/24/2014 1412   ALBUMIN 3.2* 09/24/2014 1412   AST 18 09/24/2014 1412   ALT 15 09/24/2014 1412   ALKPHOS 98 09/24/2014 1412   BILITOT 0.3 09/24/2014 1412   GFRNONAA >90 09/25/2014 0115   GFRAA >90 09/25/2014 0115    GFR CrCl cannot be calculated (Unknown ideal weight.). No results for input(s): LIPASE, AMYLASE in the last 72 hours. No results for input(s): CKTOTAL, CKMB, CKMBINDEX, TROPONINI in the last 72 hours. Invalid input(s): POCBNP No results for input(s): DDIMER in the last 72 hours. No results for input(s): HGBA1C in the last 72 hours. No results for input(s): CHOL, HDL, LDLCALC, TRIG, CHOLHDL, LDLDIRECT in the last 72 hours. No results for input(s): TSH, T4TOTAL, T3FREE, THYROIDAB in the last 72 hours.  Invalid input(s): FREET3 No results for input(s): VITAMINB12, FOLATE, FERRITIN, TIBC, IRON, RETICCTPCT in the last 72 hours. Coags: No results for  input(s): INR in the last 72 hours.  Invalid input(s): PT Microbiology: No results found for this or any previous visit (from the past 240 hour(s)).   BRIEF HOSPITAL COURSE:  GI bleeding: Suspected upper GI bleeding.admitted, started on PPI, Xarelto was discontinued on admission. GI consulted for endoscopic studies-underwent EGD on 4/18, which did not reveal any foci of bleeding. Gastroenterology recommended proceeding with a colonoscopy, however since bleeding has resolved, patient did not agree. We will resume Xarelto on 4/19, and subsequently discharge back to his assisted living facility today if he has a bed available. If in the future, patient develops significant anemia or overt GI bleeding, he will need a colonoscopy. Please recheck  CBC within a week.   Atrial fibrillation: Continue Coreg for rate control, continue Xarelto as endoscopy negative and bleeding seems to have resolved.  Anemia:likely secondary to chronic disease, Hb remains close to usual baseline.   DEPRESSION, MAJOR, MODERATE: Continue with Zoloft.   Recent right hand injury: Claims her door slammed on his right hand-a week back-Xray neg for fracture-but shows foreign bodies-case discussed with will discuss with Hand surgeon over the phone-Dr Aileen Fass reviewed Xray-and suggested outpatient follow up with him. Did not recommend any other measures while inpatient.   Thrombocytopenia: Chronic,Close to baseline. Follow   BPH: Continue with doxazosin and finasteride. Follow  Protein-calorie malnutrition: Nutrition consultation when endoscopic studies have completed   Chronic ischemic heart disease: Appears to be on both aspirin and anticoagulation, will discontinue aspirin permanently in this patient on discharge-as patient is on Xarelto and had black stools this admission   TODAY-DAY OF DISCHARGE:  Subjective:   Lanier Clam today has no headache,no chest abdominal pain,no new weakness tingling or numbness, feels  much better wants to go home today.   Objective:   Blood pressure 145/91, pulse 33, temperature 98 F (36.7 C), temperature source Oral, resp. rate 19, SpO2 97 %.  Intake/Output Summary (Last 24 hours) at 09/26/14 1322 Last data filed at 09/26/14 1246  Gross per 24 hour  Intake    860 ml  Output      0 ml  Net    860 ml   There were no vitals filed for this visit.  Exam Awake Alert, Oriented *3, No new F.N deficits, Normal affect [place].AT,PERRAL Supple Neck,No JVD, No cervical lymphadenopathy appriciated.  Symmetrical Chest wall movement, Good air movement bilaterally, CTAB RRR,No Gallops,Rubs or new Murmurs, No Parasternal Heave +ve B.Sounds, Abd Soft, Non tender, No organomegaly appriciated, No rebound -guarding or rigidity. No Cyanosis, Clubbing or edema, No new Rash or bruise  DISCHARGE CONDITION: Stable  DISPOSITION: ALF   DISCHARGE INSTRUCTIONS:    Activity:  As tolerated with Full fall precautions use walker/cane & assistance as needed  Diet recommendation: Heart Healthy diet  Discharge Instructions    Call MD for:    Complete by:  As directed   Melena/bloody stools     Diet - low sodium heart healthy    Complete by:  As directed      Increase activity slowly    Complete by:  As directed            Follow-up Information    Follow up with [name], MD. Schedule an appointment as soon as possible for a visit in 2 days.   Specialty:  Internal Medicine   Contact information:   [place] [id number] [phone]       Total Time spent on discharge equals 45 minutes.  SignedOren Binet 09/26/2014 1:22 PM

## 2014-09-26 NOTE — Anesthesia Procedure Notes (Signed)
Procedure Name: MAC Date/Time: 09/26/2014 12:23 PM Performed by: Garrison Columbus T Pre-anesthesia Checklist: Patient identified, Emergency Drugs available, Suction available and Patient being monitored Patient Re-evaluated:Patient Re-evaluated prior to inductionOxygen Delivery Method: Nasal cannula Preoxygenation: Pre-oxygenation with 100% oxygen Intubation Type: IV induction Placement Confirmation: positive ETCO2 and breath sounds checked- equal and bilateral Dental Injury: Teeth and Oropharynx as per pre-operative assessment

## 2014-09-26 NOTE — Progress Notes (Signed)
PATIENT DETAILS Name: [name] Age: 69 y.o. Sex: male Date of Birth: 29-Jul-1945 Admit Date: 09/24/2014 Admitting Physician Evalee Mutton Kristeen Mans, MD GEX:[id number], Noah Delaine, MD  Subjective: No further dark or bloody stools.  Assessment/Plan: Active Problems: GI bleeding: Suspected upper GI bleeding.admitted, started on PPI, Xarelto was discontinued. GI consulted for endoscopic studies-due for EGD today. Bleeding seems to have resolved. If no major findings on EGD, suspect can be discharged back to SNF  Atrial fibrillation: Continue Coreg for rate control, continue to hold Xarelto given above   Anemia:likely secondary to chronic disease, Hb remains close to usual baseline.   DEPRESSION, MAJOR, MODERATE: Continue with Zoloft.   Recent right hand injury: Claims her door slammed on his right hand-a week back-Xray neg for fracture-but shows foreign bodies-case discussed with will discuss with Hand surgeon over the phone-Dr Aileen Fass reviewed Xray-and suggested outpatient follow up with him. Did not recommend any other measures while inpatient.   Thrombocytopenia: Chronic,Close to baseline. Follow   BPH: Continue with doxazosin and finasteride. Follow  Protein-calorie malnutrition: Nutrition consultation when endoscopic studies have completed   Chronic ischemic heart disease: Appears to be on both aspirin and anticoagulation, would discontinue aspirin permanently in this patient on discharge.  Disposition: Remain inpatient-SNF when work complete  Antibiotics:  None   Anti-infectives    None      DVT Prophylaxis: SCD's  Code Status: Full code   Family Communication None at bedside  Procedures:  None  CONSULTS:  GI  MEDICATIONS: Scheduled Meds: . carvedilol  12.5 mg Oral BID WC  . docusate sodium  100 mg Oral BID  . doxazosin  4 mg Oral Daily  . finasteride  5 mg Oral Daily  . hydrocerin   Topical BID  . magnesium oxide  400 mg Oral BID  .  nicotine  14 mg Transdermal Daily  . pantoprazole (PROTONIX) IV  40 mg Intravenous Q12H  . [START ON 09/27/2014] pneumococcal 13-valent conjugate vaccine  0.5 mL Intramuscular Tomorrow-1000  . pravastatin  10 mg Oral QHS  . sertraline  50 mg Oral q morning - 10a  . traMADol  50 mg Oral BID  . zolpidem  5 mg Oral QHS   Continuous Infusions: . sodium chloride 75 mL/hr at 09/24/14 1846   PRN Meds:.acetaminophen **OR** acetaminophen, albuterol, guaiFENesin-dextromethorphan, ondansetron **OR** ondansetron (ZOFRAN) IV    PHYSICAL EXAM: Vital signs in last 24 hours: Filed Vitals:   09/25/14 0455 09/25/14 1520 09/25/14 2058 09/26/14 0531  BP: 126/76 123/72 137/57 132/59  Pulse: 66 62 50 52  Temp: 98.4 F (36.9 C) 98.3 F (36.8 C) 97.7 F (36.5 C) 98.6 F (37 C)  TempSrc: Oral Oral Oral Oral  Resp: 16 17 18 17   SpO2: 97% 98% 99% 95%    Weight change:  There were no vitals filed for this visit. There is no weight on file to calculate BMI.   Gen Exam: Awake and alert with clear speech.  Not in any distress Neck: Supple, No JVD.   Chest: B/L Clear.  No rales or rhonchi CVS: S1 S2 Regular, no murmurs.  Abdomen: soft, BS +, non tender, non distended.  Extremities: no edema, lower extremities warm to touch.Multiple scratches/small cuts seen in the dorsum of right hand-with no major swelling. Neurologic: Non Focal.   Skin: No Rash.   Wounds: N/A.    Intake/Output from previous day:  Intake/Output Summary (Last 24 hours) at 09/26/14 1042 Last data filed  at 09/25/14 1700  Gross per 24 hour  Intake    720 ml  Output      0 ml  Net    720 ml     LAB RESULTS: CBC  Recent Labs Lab 09/24/14 1412 09/24/14 1831 09/25/14 0115 09/25/14 1017 09/25/14 1814 09/26/14 0136  WBC 6.0 5.5 5.1 5.8 5.9 5.2  HGB 10.5* 10.4* 10.0* 10.0* 10.4* 9.6*  HCT 32.6* 32.4* 31.5* 31.5* 32.6* 30.6*  PLT 138* 131* 129* 135* 136* 127*  MCV 98.2 97.9 98.4 98.1 99.4 99.0  MCH 31.6 31.4 31.3 31.2  31.7 31.1  MCHC 32.2 32.1 31.7 31.7 31.9 31.4  RDW 13.9 13.9 13.9 13.8 13.7 13.8  LYMPHSABS 2.3  --   --   --   --   --   MONOABS 0.4  --   --   --   --   --   EOSABS 0.1  --   --   --   --   --   BASOSABS 0.0  --   --   --   --   --     Chemistries   Recent Labs Lab 09/21/14 2150 09/24/14 1412 09/25/14 0115  NA 140 139 139  K 3.8 3.8 4.1  CL 106 103 105  CO2 25 28 27   GLUCOSE 102* 94 109*  BUN 24* 19 16  CREATININE 0.63 0.75 0.65  CALCIUM 8.6 9.0 8.7    CBG: No results for input(s): GLUCAP in the last 168 hours.  GFR CrCl cannot be calculated (Unknown ideal weight.).  Coagulation profile No results for input(s): INR, PROTIME in the last 168 hours.  Cardiac Enzymes  Recent Labs Lab 09/21/14 2150  TROPONINI <0.03    Invalid input(s): POCBNP No results for input(s): DDIMER in the last 72 hours. No results for input(s): HGBA1C in the last 72 hours. No results for input(s): CHOL, HDL, LDLCALC, TRIG, CHOLHDL, LDLDIRECT in the last 72 hours. No results for input(s): TSH, T4TOTAL, T3FREE, THYROIDAB in the last 72 hours.  Invalid input(s): FREET3 No results for input(s): VITAMINB12, FOLATE, FERRITIN, TIBC, IRON, RETICCTPCT in the last 72 hours. No results for input(s): LIPASE, AMYLASE in the last 72 hours.  Urine Studies No results for input(s): UHGB, CRYS in the last 72 hours.  Invalid input(s): UACOL, UAPR, USPG, UPH, UTP, UGL, UKET, UBIL, UNIT, UROB, ULEU, UEPI, UWBC, URBC, UBAC, CAST, UCOM, BILUA  MICROBIOLOGY: No results found for this or any previous visit (from the past 240 hour(s)).  RADIOLOGY STUDIES/RESULTS: Dg Chest 2 View  09/21/2014   CLINICAL DATA:  Left-sided chest pain for 4 days.  EXAM: CHEST  2 VIEW  COMPARISON:  05/27/2013  FINDINGS: Unchanged positioning of dual-chamber pacer leads from the right. Negative cardiomediastinal silhouette. Streaky right infrahilar opacity. There is no edema, consolidation, effusion, or pneumothorax.  IMPRESSION:  1. No acute findings. 2. Mild scarring or atelectasis in the right infrahilar lung.   Electronically Signed   By: Monte Fantasia M.D.   On: 09/21/2014 22:29   Dg Hand 2 View Right  09/25/2014   CLINICAL DATA:  Right hand pain. Slammed door on fingers at home last Sunday, persistent pain.  EXAM: RIGHT HAND - 2 VIEW  COMPARISON:  None.  FINDINGS: Diffuse bony under mineralization. No fracture or dislocation. The alignment is maintained. 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 There are multiple, at least 4, small linear densities projecting in the soft tissues  of the hand overlying the metacarpals consistent with foreign bodies. These measure up to 7 mm in length.  IMPRESSION: 1. No fracture or dislocation of the right hand. 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 2. Multiple, at least 4, small linear densities in the soft tissues of the hand consistent with foreign bodies. Acuity is uncertain.   Electronically Signed   By: Jeb Levering M.D.   On: 09/25/2014 01:36    Oren Binet, MD  [facility] Pager:[phone]  If 7PM-7AM, please contact night-coverage [email or web address] Password [email or web address] 09/26/2014, 10:42 AM   LOS: 2 days

## 2014-09-26 NOTE — Progress Notes (Signed)
Attempted to call report to [facility] several times. Placed on hold for lon periods of time.

## 2014-09-26 NOTE — Discharge Planning (Signed)
Patient to return to [facility]. Patient updated at bedside.  Facility: [facility] Report number: [phone] Transportation: EMS ([facility])  Lubertha Sayres, [place] Cell: [phone]       Fax: [phone] Clinical Social Work: Orthopedics [phone]) and Surgical [phone])

## 2014-09-26 NOTE — Progress Notes (Signed)
     [facility] Progress Note  Subjective:  Hgb today 9.6. Says he had dark, loose BM last pm,, but had loose normal colored BM today. For EGD later this a.m.   Objective:  Vital signs in last 24 hours: Temp:  [97.7 F (36.5 C)-98.6 F (37 C)] 98.6 F (37 C) (04/18 0531) Pulse Rate:  [50-62] 52 (04/18 0531) Resp:  [17-18] 17 (04/18 0531) BP: (123-137)/(57-72) 132/59 mmHg (04/18 0531) SpO2:  [95 %-99 %] 95 % (04/18 0531) Last BM Date: 09/25/14 General:   Alert,  Well-developed, male   in NAD Heart:  Regular rate and rhythm; no murmurs Pulm;lungs clear Abdomen:  Soft, nontender and nondistended. Normal bowel sounds, without guarding, and without rebound.   Extremities:  Without edema. Neurologic: Alert and  oriented x3;  grossly normal neurologically. Psych: Alert and cooperative. Normal mood and affect.  Intake/Output from previous day: 04/17 0701 - 04/18 0700 In: 720 [P.O.:720] Out: -  Intake/Output this shift:    Lab Results:  Recent Labs  09/25/14 1017 09/25/14 1814 09/26/14 0136  WBC 5.8 5.9 5.2  HGB 10.0* 10.4* 9.6*  HCT 31.5* 32.6* 30.6*  PLT 135* 136* 127*   BMET  Recent Labs  09/24/14 1412 09/25/14 0115  NA 139 139  K 3.8 4.1  CL 103 105  CO2 28 27  GLUCOSE 94 109*  BUN 19 16  CREATININE 0.75 0.65  CALCIUM 9.0 8.7   LFT  Recent Labs  09/24/14 1412  PROT 6.4  ALBUMIN 3.2*  AST 18  ALT 15  ALKPHOS 98  BILITOT 0.3      ASSESSMENT/PLAN:  #1 69 yo male on aspirin and Xarelto with 2 episodes of melena prior to admission. Hemodynamically stable, no bleeding since admit  Hgb stable as compared to 5 days previous Suspect UGI bleed-R/O PUD, gastritis #2 anemia - acute on chronic , also with thrombocytopenia ? Myelodysplastic component #3 CAD #4 Atrial fib-on baby asa and Xarelto #5 Depression #6 Hx of learning disability, and unspecified psychiatric disorder  Hold Xarelto IV PPI BID Serial hgb- transfuse as  indicated to keep Hb > 8  For EGD today. Procedure discussed in detail with pt and he is agreeable to proceed.      LOS: 2 days   Harsimran Westman, Vita Barley PA-C 09/26/2014, Pager [phone]

## 2014-09-26 NOTE — Transfer of Care (Signed)
Immediate Anesthesia Transfer of Care Note  Patient: [name]  Procedure(s) Performed: Procedure(s): ESOPHAGOGASTRODUODENOSCOPY (EGD) WITH PROPOFOL (N/A)  Patient Location: Endoscopy Unit  Anesthesia Type:MAC  Level of Consciousness: awake, alert  and oriented  Airway & Oxygen Therapy: Patient Spontanous Breathing and Patient connected to nasal cannula oxygen  Post-op Assessment: Report given to RN, Post -op Vital signs reviewed and stable and Patient moving all extremities X 4  Post vital signs: Reviewed and stable  Last Vitals:  Filed Vitals:   09/26/14 1254  BP:   Pulse: 104  Temp: 36.7 C  Resp: 15    Complications: No apparent anesthesia complications

## 2014-09-26 NOTE — Anesthesia Preprocedure Evaluation (Addendum)
Anesthesia Evaluation  Patient identified by MRN, date of birth, ID band Patient awake    Reviewed: Allergy & Precautions, H&P , NPO status , Patient's Chart, lab work & pertinent test results, reviewed documented beta blocker date and time   Airway Mallampati: II  TM Distance: >3 FB Neck ROM: Full    Dental no notable dental hx. (+) Edentulous Upper, Edentulous Lower, Dental Advisory Given   Pulmonary neg pulmonary ROS, former smoker,  breath sounds clear to auscultation  Pulmonary exam normal       Cardiovascular hypertension, On Medications and On Home Beta Blockers + Peripheral Vascular Disease + dysrhythmias Atrial Fibrillation + pacemaker Rhythm:Regular Rate:Normal     Neuro/Psych  Headaches, Depression    GI/Hepatic negative GI ROS, Neg liver ROS,   Endo/Other  negative endocrine ROS  Renal/GU negative Renal ROS  negative genitourinary   Musculoskeletal   Abdominal   Peds  Hematology negative hematology ROS (+)   Anesthesia Other Findings   Reproductive/Obstetrics negative OB ROS                            Anesthesia Physical Anesthesia Plan  ASA: III  Anesthesia Plan: MAC   Post-op Pain Management:    Induction: Intravenous  Airway Management Planned: Nasal Cannula and Natural Airway  Additional Equipment:   Intra-op Plan:   Post-operative Plan:   Informed Consent: I have reviewed the patients History and Physical, chart, labs and discussed the procedure including the risks, benefits and alternatives for the proposed anesthesia with the patient or authorized representative who has indicated his/her understanding and acceptance.   Dental advisory given  Plan Discussed with: CRNA and Surgeon  Anesthesia Plan Comments:         Anesthesia Quick Evaluation

## 2014-09-26 NOTE — Clinical Social Work Note (Signed)
Per RN report, patient from [facility]. CSW attempted to meet with patient at bedside, however patient currently off floor for procedure. CSW contacted [facility] to confirm patient from facility. Facility admissions liaison confirmed patient from facility and may be able to return at discharge. CSW has faxed appropriate documentation to [facility]. CSW to continue to follow and assist with discharge planning needs.  Full assessment to follow once patient returns to floor.  Lubertha Sayres, [place] Cell: [phone]       Fax: [phone] Clinical Social Work: Orthopedics [phone]) and Surgical [phone])

## 2014-09-26 NOTE — Progress Notes (Signed)
Utilization review completed.  

## 2014-09-26 NOTE — Anesthesia Postprocedure Evaluation (Signed)
  Anesthesia Post-op Note  Patient: [name]  Procedure(s) Performed: Procedure(s): ESOPHAGOGASTRODUODENOSCOPY (EGD) WITH PROPOFOL (N/A)  Patient Location: PACU  Anesthesia Type: MAC  Level of Consciousness: awake and alert   Airway and Oxygen Therapy: Patient Spontanous Breathing  Post-op Pain: none  Post-op Assessment: Post-op Vital signs reviewed, Patient's Cardiovascular Status Stable and Respiratory Function Stable  Post-op Vital Signs: Reviewed  Filed Vitals:   09/26/14 1300  BP: 145/91  Pulse: 33  Temp:   Resp: 19    Complications: No apparent anesthesia complications

## 2014-09-27 ENCOUNTER — Encounter (HOSPITAL_COMMUNITY): Payer: Self-pay | Admitting: Internal Medicine

## 2014-09-29 ENCOUNTER — Encounter (HOSPITAL_COMMUNITY): Payer: Self-pay | Admitting: Emergency Medicine

## 2014-09-29 ENCOUNTER — Inpatient Hospital Stay (HOSPITAL_COMMUNITY)
Admission: EM | Admit: 2014-09-29 | Discharge: 2014-10-17 | DRG: 820 | Disposition: A | Discharge status: Nursing Home (Includes ICF) | Payer: Medicare Other | Attending: Internal Medicine | Admitting: Internal Medicine

## 2014-09-29 DIAGNOSIS — K2980 Duodenitis without bleeding: Secondary | ICD-10-CM | POA: Diagnosis present

## 2014-09-29 DIAGNOSIS — R410 Disorientation, unspecified: Secondary | ICD-10-CM | POA: Diagnosis present

## 2014-09-29 DIAGNOSIS — K6389 Other specified diseases of intestine: Secondary | ICD-10-CM

## 2014-09-29 DIAGNOSIS — R6521 Severe sepsis with septic shock: Secondary | ICD-10-CM | POA: Diagnosis not present

## 2014-09-29 DIAGNOSIS — Z79899 Other long term (current) drug therapy: Secondary | ICD-10-CM | POA: Diagnosis not present

## 2014-09-29 DIAGNOSIS — I259 Chronic ischemic heart disease, unspecified: Secondary | ICD-10-CM

## 2014-09-29 DIAGNOSIS — Z7901 Long term (current) use of anticoagulants: Secondary | ICD-10-CM | POA: Diagnosis not present

## 2014-09-29 DIAGNOSIS — I482 Chronic atrial fibrillation: Secondary | ICD-10-CM | POA: Diagnosis present

## 2014-09-29 DIAGNOSIS — K254 Chronic or unspecified gastric ulcer with hemorrhage: Secondary | ICD-10-CM | POA: Diagnosis not present

## 2014-09-29 DIAGNOSIS — Z9889 Other specified postprocedural states: Secondary | ICD-10-CM | POA: Diagnosis not present

## 2014-09-29 DIAGNOSIS — D62 Acute posthemorrhagic anemia: Secondary | ICD-10-CM | POA: Diagnosis not present

## 2014-09-29 DIAGNOSIS — N400 Enlarged prostate without lower urinary tract symptoms: Secondary | ICD-10-CM

## 2014-09-29 DIAGNOSIS — R188 Other ascites: Secondary | ICD-10-CM | POA: Diagnosis present

## 2014-09-29 DIAGNOSIS — K6289 Other specified diseases of anus and rectum: Secondary | ICD-10-CM

## 2014-09-29 DIAGNOSIS — D696 Thrombocytopenia, unspecified: Secondary | ICD-10-CM | POA: Diagnosis present

## 2014-09-29 DIAGNOSIS — Z87891 Personal history of nicotine dependence: Secondary | ICD-10-CM | POA: Diagnosis not present

## 2014-09-29 DIAGNOSIS — K921 Melena: Secondary | ICD-10-CM | POA: Diagnosis present

## 2014-09-29 DIAGNOSIS — J9690 Respiratory failure, unspecified, unspecified whether with hypoxia or hypercapnia: Secondary | ICD-10-CM

## 2014-09-29 DIAGNOSIS — E785 Hyperlipidemia, unspecified: Secondary | ICD-10-CM | POA: Diagnosis present

## 2014-09-29 DIAGNOSIS — G9341 Metabolic encephalopathy: Secondary | ICD-10-CM | POA: Diagnosis not present

## 2014-09-29 DIAGNOSIS — K2970 Gastritis, unspecified, without bleeding: Secondary | ICD-10-CM | POA: Diagnosis present

## 2014-09-29 DIAGNOSIS — D125 Benign neoplasm of sigmoid colon: Secondary | ICD-10-CM | POA: Diagnosis present

## 2014-09-29 DIAGNOSIS — R109 Unspecified abdominal pain: Secondary | ICD-10-CM | POA: Diagnosis present

## 2014-09-29 DIAGNOSIS — M109 Gout, unspecified: Secondary | ICD-10-CM | POA: Diagnosis present

## 2014-09-29 DIAGNOSIS — E876 Hypokalemia: Secondary | ICD-10-CM | POA: Diagnosis not present

## 2014-09-29 DIAGNOSIS — J9600 Acute respiratory failure, unspecified whether with hypoxia or hypercapnia: Secondary | ICD-10-CM

## 2014-09-29 DIAGNOSIS — E871 Hypo-osmolality and hyponatremia: Secondary | ICD-10-CM | POA: Diagnosis not present

## 2014-09-29 DIAGNOSIS — E46 Unspecified protein-calorie malnutrition: Secondary | ICD-10-CM

## 2014-09-29 DIAGNOSIS — J690 Pneumonitis due to inhalation of food and vomit: Secondary | ICD-10-CM | POA: Diagnosis not present

## 2014-09-29 DIAGNOSIS — Z993 Dependence on wheelchair: Secondary | ICD-10-CM

## 2014-09-29 DIAGNOSIS — A419 Sepsis, unspecified organism: Secondary | ICD-10-CM | POA: Diagnosis not present

## 2014-09-29 DIAGNOSIS — R579 Shock, unspecified: Secondary | ICD-10-CM | POA: Diagnosis not present

## 2014-09-29 DIAGNOSIS — R0602 Shortness of breath: Secondary | ICD-10-CM

## 2014-09-29 DIAGNOSIS — D128 Benign neoplasm of rectum: Secondary | ICD-10-CM | POA: Diagnosis present

## 2014-09-29 DIAGNOSIS — J9601 Acute respiratory failure with hypoxia: Secondary | ICD-10-CM | POA: Diagnosis present

## 2014-09-29 DIAGNOSIS — R34 Anuria and oliguria: Secondary | ICD-10-CM | POA: Diagnosis not present

## 2014-09-29 DIAGNOSIS — J441 Chronic obstructive pulmonary disease with (acute) exacerbation: Secondary | ICD-10-CM | POA: Diagnosis not present

## 2014-09-29 DIAGNOSIS — R198 Other specified symptoms and signs involving the digestive system and abdomen: Secondary | ICD-10-CM | POA: Diagnosis not present

## 2014-09-29 DIAGNOSIS — J154 Pneumonia due to other streptococci: Secondary | ICD-10-CM | POA: Diagnosis not present

## 2014-09-29 DIAGNOSIS — Z950 Presence of cardiac pacemaker: Secondary | ICD-10-CM

## 2014-09-29 DIAGNOSIS — T45515A Adverse effect of anticoagulants, initial encounter: Secondary | ICD-10-CM | POA: Diagnosis present

## 2014-09-29 DIAGNOSIS — K922 Gastrointestinal hemorrhage, unspecified: Secondary | ICD-10-CM | POA: Diagnosis not present

## 2014-09-29 DIAGNOSIS — R5381 Other malaise: Secondary | ICD-10-CM | POA: Diagnosis not present

## 2014-09-29 DIAGNOSIS — J189 Pneumonia, unspecified organism: Secondary | ICD-10-CM | POA: Diagnosis not present

## 2014-09-29 DIAGNOSIS — I4892 Unspecified atrial flutter: Secondary | ICD-10-CM | POA: Diagnosis not present

## 2014-09-29 DIAGNOSIS — K625 Hemorrhage of anus and rectum: Secondary | ICD-10-CM | POA: Diagnosis present

## 2014-09-29 DIAGNOSIS — D120 Benign neoplasm of cecum: Secondary | ICD-10-CM | POA: Diagnosis present

## 2014-09-29 DIAGNOSIS — I4891 Unspecified atrial fibrillation: Secondary | ICD-10-CM | POA: Diagnosis present

## 2014-09-29 DIAGNOSIS — K639 Disease of intestine, unspecified: Secondary | ICD-10-CM | POA: Diagnosis not present

## 2014-09-29 DIAGNOSIS — E875 Hyperkalemia: Secondary | ICD-10-CM | POA: Diagnosis not present

## 2014-09-29 DIAGNOSIS — I481 Persistent atrial fibrillation: Secondary | ICD-10-CM

## 2014-09-29 DIAGNOSIS — J9621 Acute and chronic respiratory failure with hypoxia: Secondary | ICD-10-CM | POA: Diagnosis not present

## 2014-09-29 DIAGNOSIS — I10 Essential (primary) hypertension: Secondary | ICD-10-CM | POA: Diagnosis present

## 2014-09-29 DIAGNOSIS — Z452 Encounter for adjustment and management of vascular access device: Secondary | ICD-10-CM

## 2014-09-29 DIAGNOSIS — Z681 Body mass index (BMI) 19 or less, adult: Secondary | ICD-10-CM | POA: Diagnosis not present

## 2014-09-29 DIAGNOSIS — C8510 Unspecified B-cell lymphoma, unspecified site: Principal | ICD-10-CM | POA: Diagnosis present

## 2014-09-29 DIAGNOSIS — E43 Unspecified severe protein-calorie malnutrition: Secondary | ICD-10-CM | POA: Diagnosis not present

## 2014-09-29 DIAGNOSIS — F32A Depression, unspecified: Secondary | ICD-10-CM | POA: Diagnosis present

## 2014-09-29 DIAGNOSIS — R58 Hemorrhage, not elsewhere classified: Secondary | ICD-10-CM | POA: Diagnosis present

## 2014-09-29 DIAGNOSIS — D123 Benign neoplasm of transverse colon: Secondary | ICD-10-CM | POA: Diagnosis present

## 2014-09-29 DIAGNOSIS — Z4659 Encounter for fitting and adjustment of other gastrointestinal appliance and device: Secondary | ICD-10-CM

## 2014-09-29 DIAGNOSIS — K668 Other specified disorders of peritoneum: Secondary | ICD-10-CM

## 2014-09-29 DIAGNOSIS — R0603 Acute respiratory distress: Secondary | ICD-10-CM

## 2014-09-29 DIAGNOSIS — R1030 Lower abdominal pain, unspecified: Secondary | ICD-10-CM | POA: Diagnosis not present

## 2014-09-29 DIAGNOSIS — J80 Acute respiratory distress syndrome: Secondary | ICD-10-CM | POA: Diagnosis not present

## 2014-09-29 DIAGNOSIS — I2510 Atherosclerotic heart disease of native coronary artery without angina pectoris: Secondary | ICD-10-CM | POA: Diagnosis present

## 2014-09-29 DIAGNOSIS — F329 Major depressive disorder, single episode, unspecified: Secondary | ICD-10-CM | POA: Diagnosis present

## 2014-09-29 DIAGNOSIS — R935 Abnormal findings on diagnostic imaging of other abdominal regions, including retroperitoneum: Secondary | ICD-10-CM | POA: Diagnosis not present

## 2014-09-29 HISTORY — DX: Benign prostatic hyperplasia without lower urinary tract symptoms: N40.0

## 2014-09-29 HISTORY — DX: Gastrointestinal hemorrhage, unspecified: K92.2

## 2014-09-29 HISTORY — DX: Hyperlipidemia, unspecified: E78.5

## 2014-09-29 LAB — COMPREHENSIVE METABOLIC PANEL
ALT: 12 U/L (ref 0–53)
AST: 16 U/L (ref 0–37)
Albumin: 3.3 g/dL — ABNORMAL LOW (ref 3.5–5.2)
Alkaline Phosphatase: 83 U/L (ref 39–117)
Anion gap: 8 (ref 5–15)
BUN: 25 mg/dL — AB (ref 6–23)
CALCIUM: 8.8 mg/dL (ref 8.4–10.5)
CO2: 25 mmol/L (ref 19–32)
CREATININE: 0.69 mg/dL (ref 0.50–1.35)
Chloride: 104 mmol/L (ref 96–112)
GFR calc Af Amer: >90 mL/min (ref >90)
Glucose, Bld: 115 mg/dL — ABNORMAL HIGH (ref 70–99)
Potassium: 4.4 mmol/L (ref 3.5–5.1)
Sodium: 137 mmol/L (ref 135–145)
TOTAL PROTEIN: 6.3 g/dL (ref 6.0–8.3)
Total Bilirubin: 0.3 mg/dL (ref 0.3–1.2)

## 2014-09-29 LAB — CBC WITH DIFFERENTIAL/PLATELET
BASOS ABS: 0 10*3/uL (ref 0.0–0.1)
Basophils Relative: 0 % (ref 0–1)
Eosinophils Absolute: 0.2 10*3/uL (ref 0.0–0.7)
Eosinophils Relative: 3 % (ref 0–5)
HCT: 27.9 % — ABNORMAL LOW (ref 39.0–52.0)
Hemoglobin: 9.1 g/dL — ABNORMAL LOW (ref 13.0–17.0)
LYMPHS PCT: 40 % (ref 12–46)
Lymphs Abs: 2.5 10*3/uL (ref 0.7–4.0)
MCH: 32.3 pg (ref 26.0–34.0)
MCHC: 32.6 g/dL (ref 30.0–36.0)
MCV: 98.9 fL (ref 78.0–100.0)
MONO ABS: 0.5 10*3/uL (ref 0.1–1.0)
Monocytes Relative: 7 % (ref 3–12)
NEUTROS ABS: 3.2 10*3/uL (ref 1.7–7.7)
NEUTROS PCT: 50 % (ref 43–77)
PLATELETS: 126 10*3/uL — AB (ref 150–400)
RBC: 2.82 MIL/uL — ABNORMAL LOW (ref 4.22–5.81)
RDW: 13.8 % (ref 11.5–15.5)
WBC: 6.2 10*3/uL (ref 4.0–10.5)

## 2014-09-29 LAB — PROTIME-INR
INR: 1.79 — AB (ref 0.00–1.49)
PROTHROMBIN TIME: 20.9 s — AB (ref 11.6–15.2)

## 2014-09-29 LAB — POC OCCULT BLOOD, ED: Fecal Occult Bld: POSITIVE — AB

## 2014-09-29 NOTE — H&P (Signed)
[facility] History and Physical  [name] AST:[id number] DOB: [birth year] DOA: 09/29/2014  Referring physician: ED physician PCP: [name], MD  Specialists:   Chief Complaint: Rectal bleeding  HPI: [name] is a 69 y.o. 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  Patient was recently hospitalized from 4/16 to 4/18 because of GI bleeding. He had EGD on 4/18, which was negative for active bleeding. Gastroenterology recommended proceeding with a colonoscopy, however since bleeding has resolved, patient did not agree. His Xarelto was restarted on 4/19. He started having rectal bleeding again today. He had 4 times of rectal bleeding with bright red blood per patient. He does not have lightheadedness, chest pain or shortness breath. Patient reports having severe abdominal pain over suprapubic area. He has mild nausea, but no vomiting or diarrhea.  ROS: currently patient denies fever, chills, fatigue, running nose, ear pain, headaches, cough, chest pain, SOB, constipation, dysuria, urgency, frequency, hematuria, skin rashes, joint pain or leg swelling. No unilateral weakness, numbness or tingling sensations. No vision change or hearing loss.  In ED, patient was found to have slight hemoglobin drop from 10.2 on 09/26/14 to 9.1. Temperature normal, no tachycardia, INR 1.79, electrolytes okay and positive FOBT. Patient is admitted to inpatient for further evaluation and treatment.  Review of Systems: As presented in the history of presenting illness, rest negative.  Where does patient live?  At home Can patient participate in ADLs? Yes  Allergy: No Known Allergies  Past Medical History  Diagnosis Date  . Depression   . Hypomagnesemia   . Dyspepsia   . Fall at nursing home 05/27/2013    "slipped on  water; said I broke my right hip" (05/27/2013)  . Migraine     "used to have them bad; haven't had one in awhile" (05/27/2013)  . Gout attack     "not too long ago; in my right foot" (05/27/2013)  . Complete heart block     reason for pacemaker/notes 05/27/2013  . Atrial fibrillation     [name] 05/27/2013  . HLD (hyperlipidemia)   . GIB (gastrointestinal bleeding)   . BPH (benign prostatic hyperplasia)     Past Surgical History  Procedure Laterality Date  . Insert / replace / remove pacemaker    . Appendectomy    . Cholecystectomy    . Open reduction of hip Right 05/28/2013    Procedure: OPEN REDUCTION OF HIP;  Surgeon: Renette Butters, MD;  Location: [place];  Service: Orthopedics;  Laterality: Right;  . Esophagogastroduodenoscopy (egd) with propofol N/A 09/26/2014    Procedure: ESOPHAGOGASTRODUODENOSCOPY (EGD) WITH PROPOFOL;  Surgeon: Jerene Bears, MD;  Location: [facility] ENDOSCOPY;  Service: Endoscopy;  Laterality: N/A;    Social History:  reports that he has quit smoking. His smoking use included Cigarettes. He has a 15.25 pack-year smoking history. He has quit using smokeless tobacco. His smokeless tobacco use included Snuff and Chew. He reports that he drinks alcohol. His drug history is not on file.  Family History:  Family History  Problem Relation Age of Onset  . Colon cancer Mother      Prior to Admission medications   Medication Sig Start Date End Date Taking? Authorizing Provider  Calcium Carbonate-Vit D-Min (CALTRATE 600+D PLUS MINERALS) 600-800 MG-UNIT TABS Take 1 tablet by mouth daily.   Yes Historical Provider, MD  carvedilol (  COREG) 12.5 MG tablet Take 12.5 mg by mouth 2 (two) times daily with a meal.   Yes Historical Provider, MD  docusate sodium (COLACE) 100 MG capsule Take 1 capsule (100 mg total) by mouth 2 (two) times daily. Continue this while taking narcotics to help with bowel movements 05/28/13  Yes Renette Butters, MD  doxazosin (CARDURA) 4 MG tablet Take 4  mg by mouth every morning.   Yes Historical Provider, MD  finasteride (PROSCAR) 5 MG tablet Take 5 mg by mouth every morning.   Yes Historical Provider, MD  lisinopril (PRINIVIL,ZESTRIL) 5 MG tablet Take 5 mg by mouth every morning.   Yes Historical Provider, MD  magnesium oxide (MAG-OX) 400 MG tablet Take 400 mg by mouth 2 (two) times daily.   Yes Historical Provider, MD  pravastatin (PRAVACHOL) 10 MG tablet Take 10 mg by mouth at bedtime.   Yes Historical Provider, MD  rivaroxaban (XARELTO) 20 MG TABS tablet Take 1 tablet (20 mg total) by mouth every morning. Resume 4/19 09/27/14  Yes Shanker Kristeen Mans, MD  sertraline (ZOLOFT) 50 MG tablet Take 50 mg by mouth every morning.    Yes Historical Provider, MD  traMADol (ULTRAM) 50 MG tablet Take 1 tablet (50 mg total) by mouth 2 (two) times daily. 09/26/14  Yes Shanker Kristeen Mans, MD  zolpidem (AMBIEN) 5 MG tablet Take 5 mg by mouth at bedtime.   Yes Historical Provider, MD  traMADol (ULTRAM) 50 MG tablet Take 1 tablet (50 mg total) by mouth every 6 (six) hours as needed. 09/26/14   Shanker Kristeen Mans, MD    Physical Exam: Filed Vitals:   09/29/14 2300 09/29/14 2330 09/30/14 0000 09/30/14 0024  BP: 102/58 131/67 131/95 131/61  Pulse: 67 70 77 67  Temp:    97.9 F (36.6 C)  TempSrc:    Oral  Resp: 19 23 15 18   Height:    5\' 9"  (1.753 m)  Weight:    46.4 kg (102 lb 4.7 oz)  SpO2: 98% 96% 98% 100%   General: Not in acute distress HEENT:       Eyes: PERRL, EOMI, no scleral icterus       ENT: No discharge from the ears and nose, no pharynx injection, no tonsillar enlargement.        Neck: No JVD, no bruit, no mass felt. Cardiac: S1/S2, RRR, No murmurs, No gallops or rubs Pulm: Good air movement bilaterally. Clear to auscultation bilaterally. No rales, wheezing, rhonchi or rubs. Abd: Soft, nondistended, severe tenderness over suprapubic area, no rebound pain, no organomegaly, BS present Ext: No edema bilaterally. 2+DP/PT pulse  bilaterally Musculoskeletal: No joint deformities, erythema, or stiffness, ROM full Skin: No rashes.  Neuro: Alert and oriented X3, cranial nerves II-XII grossly intact, muscle strength 5/5 in all extremeties, sensation to light touch intact.  Psych: Patient is not psychotic, no suicidal or hemocidal ideation.  Labs on Admission:  Basic Metabolic Panel:  Recent Labs Lab 09/24/14 1412 09/25/14 0115 09/29/14 2211  NA 139 139 137  K 3.8 4.1 4.4  CL 103 105 104  CO2 28 27 25   GLUCOSE 94 109* 115*  BUN 19 16 25*  CREATININE 0.75 0.65 0.69  CALCIUM 9.0 8.7 8.8   Liver Function Tests:  Recent Labs Lab 09/24/14 1412 09/29/14 2211  AST 18 16  ALT 15 12  ALKPHOS 98 83  BILITOT 0.3 0.3  PROT 6.4 6.3  ALBUMIN 3.2* 3.3*   No results for input(s): LIPASE, AMYLASE  in the last 168 hours. No results for input(s): AMMONIA in the last 168 hours. CBC:  Recent Labs Lab 09/24/14 1412  09/25/14 1017 09/25/14 1814 09/26/14 0136 09/26/14 1025 09/29/14 2211  WBC 6.0  < > 5.8 5.9 5.2 5.9 6.2  NEUTROABS 3.1  --   --   --   --   --  3.2  HGB 10.5*  < > 10.0* 10.4* 9.6* 10.2* 9.1*  HCT 32.6*  < > 31.5* 32.6* 30.6* 31.5* 27.9*  MCV 98.2  < > 98.1 99.4 99.0 99.7 98.9  PLT 138*  < > 135* 136* 127* 123* 126*  < > = values in this interval not displayed. Cardiac Enzymes: No results for input(s): CKTOTAL, CKMB, CKMBINDEX, TROPONINI in the last 168 hours.  BNP (last 3 results) No results for input(s): BNP in the last 8760 hours.  ProBNP (last 3 results) No results for input(s): PROBNP in the last 8760 hours.  CBG: No results for input(s): GLUCAP in the last 168 hours.  Radiological Exams on Admission: No results found.  EKG: will get one  Assessment/Plan Principal Problem:   GIB (gastrointestinal bleeding) Active Problems:   Protein-calorie malnutrition   Thrombocytopenia   Chronic ischemic heart disease   Atrial fibrillation   BPH (benign prostatic hyperplasia)   HLD  (hyperlipidemia)   Depression   Abdominal pain  GI bleeding: In previous admission, GI was consulted for endoscopic studies-underwent EGD on 4/18, which did not reveal any foci of bleeding. Gastroenterology recommended proceeding with a colonoscopy, however since bleeding has resolved, patient did not agree. His Xarelto was restarted on 4/19. Now presenting with GIB bleeding again. It is likely from lower GI bleeding. Hgb dropped from 10.2 to 9.1. Hemodynamically stable.   - will admit to tele bed - hold xarelto - NPO  - NS at 100 mL/hr - Start IV pantoprazole 40 mg bib - Hydroxyzine IV for nausea (patient's QTC interval is 489 on previous EKG of 09/24/14, not good candidate for Zofran) - Avoid NSAIDs and SQ heparin - Maintain IV access (2 large bore IVs). - Monitor closely and follow q6h cbc, transfuse as necessary. - LaB: INR, PTT - May consult to GI in AM for possible colonoscopy  Abdominal pain: Patient has significant tenderness over suprapubic area, no symptoms for UTI. Etiology is not clear. Patient denies any injury. -CT-abdomen/pelvis  -lipase -pain control -follow UA  Atrial Fibrillation: CHA2DS2-VASc Score is 2, needs oral anticoagulation. Patient is on Xarelto at home. INR is 1.79 on admission. Heart rate is well controlled.  -hold Xarelto due to GIB -continue Coreg for rate control  DEPRESSION, MAJOR, MODERATE: Stable. No suicidal or homicidal ideations. -Continue with Zoloft.   Thrombocytopenia: Chronic,Close to baseline. Platelet 123 on 09/26/14-->126 today. -Follow up by CBC  BPH: Stable -Continue with doxazosin and finasteride.  Protein-calorie malnutrition:  -may Start Ensure after colonoscopy is completed    Chronic ischemic heart disease: Was on both aspirin and anticoagulation before. ASA was discontinued permanently in previous admssion -hold Xarelto -continue coreg and pravastatin  Hyperlipidemia: No LDL on records. -Continue pravastatin -check  FLP  Hypertension: Well-controlled -Continue Coreg and lisinopril   DVT ppx: SCD Code Status: Full code Family Communication: None at bed side.       Disposition Plan: Admit to inpatient   Date of Service 09/30/2014    Ivor Costa [facility] Pager [phone]  If 7PM-7AM, please contact night-coverage [email or web address] Password [email or web address] 09/30/2014, 1:26 AM

## 2014-09-29 NOTE — ED Provider Notes (Signed)
I saw and evaluated the patient, reviewed the resident's note and I agree with the findings and plan.   EKG Interpretation None      69 yo male with recent admission for GI bleeding presenting due to recurrent GI bleed.  On Xarelto.  On exam, well appearing, nontoxic, not distressed, normal respiratory effort, normal perfusion, alert.  Plan admit.    Clinical Impression: 1. Melena       Serita Grit, MD 09/29/14 [phone]

## 2014-09-29 NOTE — ED Notes (Signed)
Pt to ED via [facility] with c/o mid lower abd pain.  Pt st's has had bright red stools today.  Pt alert and oriented x's 3

## 2014-09-30 ENCOUNTER — Encounter (HOSPITAL_COMMUNITY): Payer: Self-pay | Admitting: Internal Medicine

## 2014-09-30 ENCOUNTER — Inpatient Hospital Stay (HOSPITAL_COMMUNITY): Payer: Medicare Other

## 2014-09-30 DIAGNOSIS — R109 Unspecified abdominal pain: Secondary | ICD-10-CM | POA: Diagnosis present

## 2014-09-30 DIAGNOSIS — K921 Melena: Secondary | ICD-10-CM

## 2014-09-30 DIAGNOSIS — K922 Gastrointestinal hemorrhage, unspecified: Secondary | ICD-10-CM | POA: Diagnosis present

## 2014-09-30 DIAGNOSIS — F329 Major depressive disorder, single episode, unspecified: Secondary | ICD-10-CM | POA: Diagnosis present

## 2014-09-30 DIAGNOSIS — Z7901 Long term (current) use of anticoagulants: Secondary | ICD-10-CM

## 2014-09-30 DIAGNOSIS — R935 Abnormal findings on diagnostic imaging of other abdominal regions, including retroperitoneum: Secondary | ICD-10-CM

## 2014-09-30 DIAGNOSIS — E785 Hyperlipidemia, unspecified: Secondary | ICD-10-CM | POA: Diagnosis present

## 2014-09-30 DIAGNOSIS — F32A Depression, unspecified: Secondary | ICD-10-CM | POA: Diagnosis present

## 2014-09-30 LAB — TYPE AND SCREEN
ABO/RH(D): O POS
ANTIBODY SCREEN: NEGATIVE

## 2014-09-30 LAB — CBC
HCT: 31.3 % — ABNORMAL LOW (ref 39.0–52.0)
HCT: 30.8 % — ABNORMAL LOW (ref 39.0–52.0)
HEMOGLOBIN: 9.8 g/dL — AB (ref 13.0–17.0)
Hemoglobin: 10.1 g/dL — ABNORMAL LOW (ref 13.0–17.0)
MCH: 32 pg (ref 26.0–34.0)
MCH: 31.2 pg (ref 26.0–34.0)
MCHC: 32.3 g/dL (ref 30.0–36.0)
MCHC: 31.8 g/dL (ref 30.0–36.0)
MCV: 99.1 fL (ref 78.0–100.0)
MCV: 98.1 fL (ref 78.0–100.0)
PLATELETS: 149 10*3/uL — AB (ref 150–400)
Platelets: 146 10*3/uL — ABNORMAL LOW (ref 150–400)
RBC: 3.16 MIL/uL — ABNORMAL LOW (ref 4.22–5.81)
RBC: 3.14 MIL/uL — AB (ref 4.22–5.81)
RDW: 13.5 % (ref 11.5–15.5)
RDW: 13.9 % (ref 11.5–15.5)
WBC: 6.2 10*3/uL (ref 4.0–10.5)
WBC: 6 10*3/uL (ref 4.0–10.5)

## 2014-09-30 LAB — LIPID PANEL
Cholesterol: 110 mg/dL (ref 0–200)
HDL: 35 mg/dL — ABNORMAL LOW (ref >39)
LDL CALC: 62 mg/dL (ref 0–99)
Total CHOL/HDL Ratio: 3.1 RATIO
Triglycerides: 66 mg/dL (ref <150)
VLDL: 13 mg/dL (ref 0–40)

## 2014-09-30 LAB — COMPREHENSIVE METABOLIC PANEL
ALT: 13 U/L (ref 0–53)
AST: 14 U/L (ref 0–37)
Albumin: 3 g/dL — ABNORMAL LOW (ref 3.5–5.2)
Alkaline Phosphatase: 81 U/L (ref 39–117)
Anion gap: 7 (ref 5–15)
BUN: 19 mg/dL (ref 6–23)
CO2: 24 mmol/L (ref 19–32)
Calcium: 8.6 mg/dL (ref 8.4–10.5)
Chloride: 107 mmol/L (ref 96–112)
Creatinine, Ser: 0.7 mg/dL (ref 0.50–1.35)
Glucose, Bld: 110 mg/dL — ABNORMAL HIGH (ref 70–99)
Potassium: 4.3 mmol/L (ref 3.5–5.1)
SODIUM: 138 mmol/L (ref 135–145)
Total Bilirubin: 0.4 mg/dL (ref 0.3–1.2)
Total Protein: 5.9 g/dL — ABNORMAL LOW (ref 6.0–8.3)

## 2014-09-30 LAB — LIPASE, BLOOD: Lipase: 40 U/L (ref 11–59)

## 2014-09-30 LAB — URINALYSIS, ROUTINE W REFLEX MICROSCOPIC
BILIRUBIN URINE: NEGATIVE
GLUCOSE, UA: NEGATIVE mg/dL
Hgb urine dipstick: NEGATIVE
Ketones, ur: NEGATIVE mg/dL
Leukocytes, UA: NEGATIVE
NITRITE: NEGATIVE
PH: 7.5 (ref 5.0–8.0)
Protein, ur: NEGATIVE mg/dL
Specific Gravity, Urine: 1.008 (ref 1.005–1.030)
Urobilinogen, UA: 0.2 mg/dL (ref 0.0–1.0)

## 2014-09-30 LAB — GLUCOSE, CAPILLARY
GLUCOSE-CAPILLARY: 90 mg/dL (ref 70–99)
Glucose-Capillary: 94 mg/dL (ref 70–99)

## 2014-09-30 LAB — HEMOGLOBIN AND HEMATOCRIT, BLOOD
HCT: 30.1 % — ABNORMAL LOW (ref 39.0–52.0)
HEMATOCRIT: 30 % — AB (ref 39.0–52.0)
HEMOGLOBIN: 9.6 g/dL — AB (ref 13.0–17.0)
HEMOGLOBIN: 9.7 g/dL — AB (ref 13.0–17.0)

## 2014-09-30 LAB — MRSA PCR SCREENING: MRSA by PCR: NEGATIVE

## 2014-09-30 LAB — APTT: APTT: 48 s — AB (ref 24–37)

## 2014-09-30 MED ORDER — SODIUM CHLORIDE 0.9 % IV SOLN
INTRAVENOUS | Status: DC
  Administered 2014-09-30 (×2): via INTRAVENOUS

## 2014-09-30 MED ORDER — MORPHINE SULFATE 2 MG/ML IJ SOLN
1.0000 mg | INTRAMUSCULAR | Status: DC | PRN
  Administered 2014-10-02: 1 mg via INTRAVENOUS
  Filled 2014-09-30: qty 1

## 2014-09-30 MED ORDER — FINASTERIDE 5 MG PO TABS
5.0000 mg | ORAL_TABLET | Freq: Every day | ORAL | Status: DC
  Administered 2014-09-30 – 2014-10-06 (×6): 5 mg via ORAL
  Filled 2014-09-30 (×7): qty 1

## 2014-09-30 MED ORDER — PEG-KCL-NACL-NASULF-NA ASC-C 100 G PO SOLR
1.0000 | Freq: Once | ORAL | Status: DC
  Filled 2014-09-30: qty 1

## 2014-09-30 MED ORDER — PANTOPRAZOLE SODIUM 40 MG PO TBEC
40.0000 mg | DELAYED_RELEASE_TABLET | Freq: Every day | ORAL | Status: DC
  Administered 2014-10-01 – 2014-10-06 (×6): 40 mg via ORAL
  Filled 2014-09-30 (×6): qty 1

## 2014-09-30 MED ORDER — IOHEXOL 300 MG/ML  SOLN
25.0000 mL | INTRAMUSCULAR | Status: AC
  Administered 2014-09-30 (×2): 25 mL via ORAL

## 2014-09-30 MED ORDER — SODIUM CHLORIDE 0.9 % IV SOLN
INTRAVENOUS | Status: DC
  Administered 2014-10-02: 75 mL/h via INTRAVENOUS
  Administered 2014-10-03 – 2014-10-04 (×2): via INTRAVENOUS

## 2014-09-30 MED ORDER — HYDROXYZINE HCL 50 MG/ML IM SOLN
25.0000 mg | Freq: Four times a day (QID) | INTRAMUSCULAR | Status: DC | PRN
  Filled 2014-09-30: qty 0.5

## 2014-09-30 MED ORDER — MAGNESIUM OXIDE 400 (241.3 MG) MG PO TABS
400.0000 mg | ORAL_TABLET | Freq: Two times a day (BID) | ORAL | Status: DC
  Administered 2014-09-30 – 2014-10-03 (×8): 400 mg via ORAL
  Filled 2014-09-30 (×9): qty 1

## 2014-09-30 MED ORDER — SERTRALINE HCL 50 MG PO TABS
50.0000 mg | ORAL_TABLET | Freq: Every morning | ORAL | Status: DC
  Administered 2014-09-30 – 2014-10-06 (×6): 50 mg via ORAL
  Filled 2014-09-30 (×7): qty 1

## 2014-09-30 MED ORDER — PANTOPRAZOLE SODIUM 40 MG IV SOLR
40.0000 mg | Freq: Every day | INTRAVENOUS | Status: DC
  Administered 2014-09-30: 40 mg via INTRAVENOUS
  Filled 2014-09-30 (×2): qty 40

## 2014-09-30 MED ORDER — ZOLPIDEM TARTRATE 5 MG PO TABS
5.0000 mg | ORAL_TABLET | Freq: Every day | ORAL | Status: DC
  Administered 2014-09-30 – 2014-10-04 (×5): 5 mg via ORAL
  Filled 2014-09-30 (×5): qty 1

## 2014-09-30 MED ORDER — LISINOPRIL 5 MG PO TABS
5.0000 mg | ORAL_TABLET | Freq: Every day | ORAL | Status: DC
  Administered 2014-09-30 – 2014-10-03 (×4): 5 mg via ORAL
  Filled 2014-09-30 (×4): qty 1

## 2014-09-30 MED ORDER — PRAVASTATIN SODIUM 10 MG PO TABS
10.0000 mg | ORAL_TABLET | Freq: Every day | ORAL | Status: DC
  Administered 2014-09-30 – 2014-10-05 (×7): 10 mg via ORAL
  Filled 2014-09-30 (×8): qty 1

## 2014-09-30 MED ORDER — TRAMADOL HCL 50 MG PO TABS
50.0000 mg | ORAL_TABLET | Freq: Four times a day (QID) | ORAL | Status: DC | PRN
  Administered 2014-10-03 (×2): 50 mg via ORAL
  Filled 2014-09-30 (×2): qty 1

## 2014-09-30 MED ORDER — CARVEDILOL 12.5 MG PO TABS
12.5000 mg | ORAL_TABLET | Freq: Two times a day (BID) | ORAL | Status: DC
  Administered 2014-09-30 – 2014-10-06 (×10): 12.5 mg via ORAL
  Filled 2014-09-30 (×15): qty 1

## 2014-09-30 MED ORDER — DOCUSATE SODIUM 100 MG PO CAPS: 100.0000 mg | ORAL_CAPSULE | Freq: Two times a day (BID) | ORAL | Status: DC | PRN

## 2014-09-30 MED ORDER — IOHEXOL 300 MG/ML  SOLN
100.0000 mL | Freq: Once | INTRAMUSCULAR | Status: AC | PRN
  Administered 2014-09-30: 100 mL via INTRAVENOUS

## 2014-09-30 MED ORDER — SODIUM CHLORIDE 0.9 % IJ SOLN
3.0000 mL | Freq: Two times a day (BID) | INTRAMUSCULAR | Status: DC
  Administered 2014-09-30 – 2014-10-12 (×17): 3 mL via INTRAVENOUS

## 2014-09-30 MED ORDER — DOXAZOSIN MESYLATE 4 MG PO TABS
4.0000 mg | ORAL_TABLET | Freq: Every day | ORAL | Status: DC
Start: 2014-09-30 — End: 2014-10-06
  Administered 2014-09-30 – 2014-10-06 (×6): 4 mg via ORAL
  Filled 2014-09-30 (×7): qty 1

## 2014-09-30 MED ORDER — CALCIUM CARBONATE-VITAMIN D 500-200 MG-UNIT PO TABS
1.0000 | ORAL_TABLET | Freq: Every day | ORAL | Status: DC
  Administered 2014-09-30 – 2014-10-06 (×6): 1 via ORAL
  Filled 2014-09-30 (×7): qty 1

## 2014-09-30 MED ORDER — MAGNESIUM HYDROXIDE 400 MG/5ML PO SUSP
30.0000 mL | Freq: Once | ORAL | Status: AC
  Administered 2014-09-30: 30 mL via ORAL
  Filled 2014-09-30: qty 30

## 2014-09-30 MED ORDER — PEG-KCL-NACL-NASULF-NA ASC-C 100 G PO SOLR
1.0000 | Freq: Once | ORAL | Status: AC
  Administered 2014-09-30: 200 g via ORAL

## 2014-09-30 MED ORDER — DILTIAZEM HCL ER COATED BEADS 120 MG PO CP24
120.0000 mg | ORAL_CAPSULE | Freq: Every day | ORAL | Status: DC
  Administered 2014-09-30 – 2014-10-06 (×5): 120 mg via ORAL
  Filled 2014-09-30 (×8): qty 1

## 2014-09-30 NOTE — Progress Notes (Addendum)
[facility]                                                                              Patient Demographics  [name], is a 69 y.o. male, DOB - Apr 18, 1946, DGL:[id number]  Admit date - 09/29/2014   Admitting Physician Ivor Costa, MD  Outpatient Primary MD for the patient is [name], MD  LOS - 1   Chief Complaint  Patient presents with  . 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 Patient was recently hospitalized from 4/16 to 4/18 because of GI bleeding. He had EGD on 4/18, which was negative for active bleeding. GI recommended proceeding with a colonoscopy, however since bleeding had resolved, patient refuse colonoscopy. His Xarelto was restarted on 4/19. He started having rectal bleeding again on the day of admission. He had 4 times of rectal bleeding with bright red blood per patient. He denied any lightheadedness, chest pain or shortness breath. Patient reports having severe abdominal pain over suprapubic area. He has mild nausea, but no vomiting or diarrhea.   Assessment & Plan    Principal Problem:   GIB (gastrointestinal bleeding)/lower GI bleed - Recent endoscopically on 4/18 did not reveal any foci of bleeding - Continue to hold xarelto, perhaps he is not a good candidate for anticoagulation at this point with recurrent GI bleeding  - Continue IV PPI, discussed with gastroenterology, recommended start clear liquid diet, milk of magnesia laxative and GI will evaluate for further recommendations. - Continue serial H&H  Addendum 1:38pm CT abdomen and pelvis Heterogeneous collection measuring 1.8 x 4.6 cm over the posterior right mid abdomen immediately posterior to several small bowel loops and inferior to the right kidney. This likely represents a small  hemorrhagic collection with etiology uncertain. No evidence of free peritoneal air or bowel obstruction. - will continue to hold xarelto   Active Problems: Suprapubic Abdominal pain - UA negative for UTI, follow CT abdomen and pelvis, denies any dysuria  Chronic atrial fibrillation - CHADS2-VASc Score is 2, patient is on xarelto, perhaps he is not a good candidate for antiplatelet medication at this point with recurrent GI bleeding. Will continue to hold xarelto at discharge and patient will need to discuss with his primary care physician or his cardiologist regarding further decision about anticoagulation - Continue Coreg  DEPRESSION: -  Stable. No suicidal or homicidal ideations. - Continue  Zoloft.   Thrombocytopenia: Chronic - Currently close to baseline, follow CBC.   BPH: Stable -Continue with doxazosin and finasteride.  Protein-calorie malnutrition:  - Nutrition consult, will place on nutritional supplements  Chronic ischemic heart disease:  - Patient was on both aspirin and anticoagulation before. ASA was discontinued permanently in previous admssion - hold Xarelto -continue coreg and pravastatin  Hyperlipidemia:  - Continue pravastatin - Lipid panel showed LDL 62, cholesterol 110  Hypertension: Well-controlled -Continue Coreg and lisinopril   Code Status: Full code  Family Communication: Discussed in detail with the  patient, all imaging results, lab results explained to the patient    Disposition Plan: Awaiting GI evaluation  Time Spent in minutes   25 minutes  Procedures  none  Consults   Gastroenterology  DVT Prophylaxis  SCD's  Medications  Scheduled Meds: . calcium-vitamin D  1 tablet Oral Daily  . carvedilol  12.5 mg Oral BID WC  . doxazosin  4 mg Oral Daily  . finasteride  5 mg Oral Daily  . lisinopril  5 mg Oral Daily  . magnesium oxide  400 mg Oral BID  . pantoprazole (PROTONIX) IV  40 mg Intravenous QHS  . pravastatin  10 mg Oral  QHS  . sertraline  50 mg Oral q morning - 10a  . sodium chloride  3 mL Intravenous Q12H  . zolpidem  5 mg Oral QHS   Continuous Infusions: . sodium chloride 75 mL/hr at 09/30/14 0030   PRN Meds:.docusate sodium, hydrOXYzine, morphine injection, traMADol   Antibiotics   Anti-infectives    None        Subjective:   [name] was seen and examined today. Patient denies dizziness, chest pain, shortness of breath, N/V/D/C, new weakness, numbess, tingling. No acute events overnight.  Complaining of suprapubic abdominal pain, no ongoing rectal bleeding currently, no fevers  Objective:   Blood pressure 114/60, pulse 76, temperature 98.2 F (36.8 C), temperature source Oral, resp. rate 18, height 5\' 9"  (1.753 m), weight 46.4 kg (102 lb 4.7 oz), SpO2 97 %.  Wt Readings from Last 3 Encounters:  09/30/14 46.4 kg (102 lb 4.7 oz)  05/31/13 50.077 kg (110 lb 6.4 oz)  12/24/06 42.661 kg (94 lb 0.8 oz)     Intake/Output Summary (Last 24 hours) at 09/30/14 1114 Last data filed at 09/30/14 1100  Gross per 24 hour  Intake      0 ml  Output    800 ml  Net   -800 ml    Exam  General: Alert and oriented x 3, NAD  HEENT:  PERRLA, EOMI, Anicteic Sclera, mucous membranes moist.   Neck: Supple, no JVD, no masses  CVS: S1 S2 auscultated, no rubs, murmurs or gallops. Regular rate and rhythm.  Respiratory: Clear to auscultation bilaterally, no wheezing, rales or rhonchi  Abdomen: Soft, tender in the suprapubic region, nondistended, + bowel sounds  Ext: no cyanosis clubbing or edema  Neuro: AAOx3, Cr N's II- XII. Strength 5/5 upper and lower extremities bilaterally  Skin: No rashes  Psych: Normal affect and demeanor, alert and oriented x3    Data Review   Micro Results Recent Results (from the past 240 hour(s))  MRSA PCR Screening     Status: None   Collection Time: 09/30/14  1:10 AM  Result Value Ref Range Status   MRSA by PCR NEGATIVE NEGATIVE Final    Comment:          The GeneXpert MRSA Assay (FDA approved for NASAL specimens only), is one component of a comprehensive MRSA colonization surveillance program. It is not intended to diagnose MRSA infection nor to guide or monitor treatment for MRSA infections.     Radiology Reports Dg Chest 2 View  09/21/2014   CLINICAL DATA:  Left-sided chest pain for 4 days.  EXAM: CHEST  2 VIEW  COMPARISON:  05/27/2013  FINDINGS: Unchanged positioning of dual-chamber pacer leads from the right. Negative cardiomediastinal silhouette. Streaky right infrahilar opacity. There is no edema, consolidation, effusion, or pneumothorax.  IMPRESSION: 1. No acute findings. 2. Mild scarring  or atelectasis in the right infrahilar lung.   Electronically Signed   By: Monte Fantasia M.D.   On: 09/21/2014 22:29   Dg Hand 2 View Right  09/25/2014   CLINICAL DATA:  Right hand pain. Slammed door on fingers at home last Sunday, persistent pain.  EXAM: RIGHT HAND - 2 VIEW  COMPARISON:  None.  FINDINGS: Diffuse bony under mineralization. No fracture or dislocation. The alignment is maintained. 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 There are multiple, at least 4, small linear densities projecting in the soft tissues of the hand overlying the metacarpals consistent with foreign bodies. These measure up to 7 mm in length.  IMPRESSION: 1. No fracture or dislocation of the right hand. 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 2. Multiple, at least 4, small linear densities in the soft tissues of the hand consistent with foreign bodies. Acuity is uncertain.   Electronically Signed   By: Jeb Levering M.D.   On: 09/25/2014 01:36    CBC  Recent Labs Lab 09/24/14 1412  09/26/14 0136 09/26/14 1025 09/29/14 2211 09/30/14 0253 09/30/14 0600  WBC 6.0  < > 5.2 5.9 6.2 6.2 6.0  HGB 10.5*  < > 9.6* 10.2* 9.1* 10.1* 9.8*  HCT 32.6*  < > 30.6* 31.5* 27.9* 31.3* 30.8*  PLT 138*  < > 127* 123*  126* 149* 146*  MCV 98.2  < > 99.0 99.7 98.9 99.1 98.1  MCH 31.6  < > 31.1 32.3 32.3 32.0 31.2  MCHC 32.2  < > 31.4 32.4 32.6 32.3 31.8  RDW 13.9  < > 13.8 13.6 13.8 13.5 13.9  LYMPHSABS 2.3  --   --   --  2.5  --   --   MONOABS 0.4  --   --   --  0.5  --   --   EOSABS 0.1  --   --   --  0.2  --   --   BASOSABS 0.0  --   --   --  0.0  --   --   < > = values in this interval not displayed.  Chemistries   Recent Labs Lab 09/24/14 1412 09/25/14 0115 09/29/14 2211 09/30/14 0600  NA 139 139 137 138  K 3.8 4.1 4.4 4.3  CL 103 105 104 107  CO2 28 27 25 24   GLUCOSE 94 109* 115* 110*  BUN 19 16 25* 19  CREATININE 0.75 0.65 0.69 0.70  CALCIUM 9.0 8.7 8.8 8.6  AST 18  --  16 14  ALT 15  --  12 13  ALKPHOS 98  --  83 81  BILITOT 0.3  --  0.3 0.4   ------------------------------------------------------------------------------------------------------------------ estimated creatinine clearance is 58.8 mL/min (by C-G formula based on Cr of 0.7). ------------------------------------------------------------------------------------------------------------------ No results for input(s): HGBA1C in the last 72 hours. ------------------------------------------------------------------------------------------------------------------  Recent Labs  09/30/14 0253  CHOL 110  HDL 35*  LDLCALC 62  TRIG 66  CHOLHDL 3.1   ------------------------------------------------------------------------------------------------------------------ No results for input(s): TSH, T4TOTAL, T3FREE, THYROIDAB in the last 72 hours.  Invalid input(s): FREET3 ------------------------------------------------------------------------------------------------------------------ No results for input(s): VITAMINB12, FOLATE, FERRITIN, TIBC, IRON, RETICCTPCT in the last 72 hours.  Coagulation profile  Recent Labs Lab 09/29/14 2211  INR 1.79*    No results for input(s): DDIMER in the last 72 hours.  Cardiac  Enzymes No results for input(s): CKMB, TROPONINI, MYOGLOBIN in the last 168 hours.  Invalid input(s): CK ------------------------------------------------------------------------------------------------------------------  Invalid input(s): POCBNP   Recent Labs  09/30/14 0810  GLUCAP 90     Teyona Nichelson M.D. [facility] 09/30/2014, 11:14 AM  Pager: [phone]   Between 7am to 7pm - call Pager - [phone]  After 7pm go to [email or web address] - password [email or web address]  Call night coverage person covering after 7pm

## 2014-09-30 NOTE — Consult Note (Signed)
[facility] Consult: 2:09 PM 09/30/2014  LOS: 1 day    Referring Provider: Dr Tana Coast  Primary Care Physician:  [name], MD Primary Gastroenterologist:  Dr. Henrene Pastor remotely.     Reason for Consultation:  Hematochezia   HPI: [name] is a 69 y.o. male.  Wheelchair bound.  Unspecified psych disorder and developmental disability. On Xarelto for hx a fib.  CAD. Status post PPM.  Admission 4/16 - 4/18 with dark, FOBT + stools.  EGD 09/26/14 for melena 1. The mucosa of the esophagus appeared normal 2. Mild gastritis (inflammation) was found in the gastric antrum 3. Duodenal inflammation was found in the duodenal bulb and sweep 4. The duodenal mucosa showed no abnormalities in the 2nd part of the duodenum  Hgb nadir of 9.6. Platelets in 120s - 130s. BUN max 25.   Pt refused/declined inpt colonoscopy. Resumed xarelto 4/19. 81 mg ASA was not resumed. Serum H pylori Ag is pending from 4/18.  Returned to ED late 09/29/14 with rectal bleeding.  Initially complained of suprapubic area pain (he currently denies this). Had a total of 4 episodes hematochezia PTA.  Hgb down to 9.1.  No stools or recurrent bleeding today. Patient says his appetite and good, no nausea. Denies dizziness, chest pain or heart palpitations.    Past Medical History  Diagnosis Date  . Depression   . Hypomagnesemia   . Dyspepsia   . Fall at nursing home 05/27/2013    "slipped on water; said I broke my right hip" (05/27/2013)  . Migraine     "used to have them bad; haven't had one in awhile" (05/27/2013)  . Gout attack     "not too long ago; in my right foot" (05/27/2013)  . Complete heart block     reason for pacemaker/notes 05/27/2013  . Atrial fibrillation     [name] 05/27/2013  . HLD (hyperlipidemia)   . GIB  (gastrointestinal bleeding)   . BPH (benign prostatic hyperplasia)     Past Surgical History  Procedure Laterality Date  . Insert / replace / remove pacemaker    . Appendectomy    . Cholecystectomy    . Open reduction of hip Right 05/28/2013    Procedure: OPEN REDUCTION OF HIP;  Surgeon: Renette Butters, MD;  Location: [place];  Service: Orthopedics;  Laterality: Right;  . Esophagogastroduodenoscopy (egd) with propofol N/A 09/26/2014    Procedure: ESOPHAGOGASTRODUODENOSCOPY (EGD) WITH PROPOFOL;  Surgeon: Jerene Bears, MD;  Location: [facility] ENDOSCOPY;  Service: Endoscopy;  Laterality: N/A;    Prior to Admission medications   Medication Sig Start Date End Date Taking? Authorizing Provider  Calcium Carbonate-Vit D-Min (CALTRATE 600+D PLUS MINERALS) 600-800 MG-UNIT TABS Take 1 tablet by mouth daily.   Yes Historical Provider, MD  carvedilol (COREG) 12.5 MG tablet Take 12.5 mg by mouth 2 (two) times daily with a meal.   Yes Historical Provider, MD  docusate sodium (COLACE) 100 MG capsule Take 1 capsule (100 mg total) by mouth 2 (two) times daily. Continue this while taking narcotics to help  with bowel movements 05/28/13  Yes Renette Butters, MD  doxazosin (CARDURA) 4 MG tablet Take 4 mg by mouth every morning.   Yes Historical Provider, MD  finasteride (PROSCAR) 5 MG tablet Take 5 mg by mouth every morning.   Yes Historical Provider, MD  lisinopril (PRINIVIL,ZESTRIL) 5 MG tablet Take 5 mg by mouth every morning.   Yes Historical Provider, MD  magnesium oxide (MAG-OX) 400 MG tablet Take 400 mg by mouth 2 (two) times daily.   Yes Historical Provider, MD  pravastatin (PRAVACHOL) 10 MG tablet Take 10 mg by mouth at bedtime.   Yes Historical Provider, MD  rivaroxaban (XARELTO) 20 MG TABS tablet Take 1 tablet (20 mg total) by mouth every morning. Resume 4/19 09/27/14  Yes Shanker Kristeen Mans, MD  sertraline (ZOLOFT) 50 MG tablet Take 50 mg by mouth every morning.    Yes Historical Provider, MD  traMADol  (ULTRAM) 50 MG tablet Take 1 tablet (50 mg total) by mouth 2 (two) times daily. 09/26/14  Yes Shanker Kristeen Mans, MD  zolpidem (AMBIEN) 5 MG tablet Take 5 mg by mouth at bedtime.   Yes Historical Provider, MD  traMADol (ULTRAM) 50 MG tablet Take 1 tablet (50 mg total) by mouth every 6 (six) hours as needed. 09/26/14   Shanker Kristeen Mans, MD    Scheduled Meds: . calcium-vitamin D  1 tablet Oral Daily  . carvedilol  12.5 mg Oral BID WC  . doxazosin  4 mg Oral Daily  . finasteride  5 mg Oral Daily  . lisinopril  5 mg Oral Daily  . magnesium hydroxide  30 mL Oral Once  . magnesium oxide  400 mg Oral BID  . pantoprazole (PROTONIX) IV  40 mg Intravenous QHS  . pravastatin  10 mg Oral QHS  . sertraline  50 mg Oral q morning - 10a  . sodium chloride  3 mL Intravenous Q12H  . zolpidem  5 mg Oral QHS   Infusions: . sodium chloride 75 mL/hr at 09/30/14 0030   PRN Meds: docusate sodium, hydrOXYzine, morphine injection, traMADol   Allergies as of 09/29/2014  . (No Known Allergies)    Family History  Problem Relation Age of Onset  . Colon cancer Mother     History   Social History  . Marital Status: Divorced    Spouse Name: N/A  . Number of Children: N/A  . Years of Education: N/A   Occupational History  . Not on file.   Social History Main Topics  . Smoking status: Former Smoker -- 0.25 packs/day for 61 years    Types: Cigarettes  . Smokeless tobacco: Former Systems developer    Types: Snuff, Chew     Comment: 05/27/2013 "haven't used chew or snuff in ~ 30 yr"  . Alcohol Use: Yes     Comment: 05/27/2013 "none in years; never had problem w/it"  . Drug Use: Not on file  . Sexual Activity: No   Other Topics Concern  . Not on file   Social History Narrative    REVIEW OF SYSTEMS: Pertinent positive and negative review of systems were noted in the above HPI section. All other review of systems was otherwise negative..  Transfusions:  05/2013 Neuro:  No headaches, no peripheral tingling  or numbness Derm:  No itching, no rash or sores.  Endocrine:  No sweats or chills.  No polyuria or dysuria Immunization:  Not queried.  Travel:  None beyond local counties in last few months.  PHYSICAL EXAM: Vital signs in last 24 hours: Filed Vitals:   09/30/14 0834  BP: 114/60  Pulse: 76  Temp:   Resp:    Wt Readings from Last 3 Encounters:  09/30/14 102 lb 4.7 oz (46.4 kg)  05/31/13 110 lb 6.4 oz (50.077 kg)  12/24/06 94 lb 0.8 oz (42.661 kg)    General: Pleasant, comfortable. Does not look acutely ill. Head:  No facial asymmetry or swelling. No signs of head trauma  Eyes:  No conjunctival pallor or scleral icterus Ears:  Not hard of hearing.  Nose:  No congestion, no nasal discharge. Mouth:  Edentulous. Moist, pink and clear oral mucosa. Neck:  No JVD, no TMG, no bruits, no masses. Lungs:  Somewhat reduced breath sounds but overall clear. No cough, no dyspnea. Vocal quality is a bit hoarse Heart: irreg/irreg though paced rhythm. Pacemaker on right upper chest Abdomen:  Soft, NT, ND.  No bruits, no masses. Active bowel sounds..   Rectal: Deferred.   Musc/Skeltl: No joint contractures, swelling, erythema or gross deformities. Extremities:  No CCE.  Neurologic:  He isn't oriented to year, place, reason for admission. No limb weakness, no tremors. Skin:  No telangiectasia, no sores, no rashes. Tattoos:  None Nodes:  No cervical adenopathy.   Psych:  Pleasant, cooperative. Not agitated or depressed.  Intake/Output from previous day: 04/21 0701 - 04/22 0700 In: -  Out: 150 [Urine:150] Intake/Output this shift: Total I/O In: 300 [I.V.:300] Out: 850 [Urine:850]  LAB RESULTS:  Recent Labs  09/29/14 2211 09/30/14 0253 09/30/14 0600 09/30/14 1240  WBC 6.2 6.2 6.0  --   HGB 9.1* 10.1* 9.8* 9.6*  HCT 27.9* 31.3* 30.8* 30.0*  PLT 126* 149* 146*  --    BMET Lab Results  Component Value Date   NA 138 09/30/2014   NA 137 09/29/2014   NA 139 09/25/2014   K 4.3  09/30/2014   K 4.4 09/29/2014   K 4.1 09/25/2014   CL 107 09/30/2014   CL 104 09/29/2014   CL 105 09/25/2014   CO2 24 09/30/2014   CO2 25 09/29/2014   CO2 27 09/25/2014   GLUCOSE 110* 09/30/2014   GLUCOSE 115* 09/29/2014   GLUCOSE 109* 09/25/2014   BUN 19 09/30/2014   BUN 25* 09/29/2014   BUN 16 09/25/2014   CREATININE 0.70 09/30/2014   CREATININE 0.69 09/29/2014   CREATININE 0.65 09/25/2014   CALCIUM 8.6 09/30/2014   CALCIUM 8.8 09/29/2014   CALCIUM 8.7 09/25/2014   LFT  Recent Labs  09/29/14 2211 09/30/14 0600  PROT 6.3 5.9*  ALBUMIN 3.3* 3.0*  AST 16 14  ALT 12 13  ALKPHOS 83 81  BILITOT 0.3 0.4   PT/INR Lab Results  Component Value Date   INR 1.79* 09/29/2014   INR 3.69* 05/31/2013   INR 5.15* 05/31/2013   Hepatitis Panel No results for input(s): HEPBSAG, HCVAB, HEPAIGM, HEPBIGM in the last 72 hours. C-Diff No components found for: CDIFF Lipase     Component Value Date/Time   LIPASE 40 09/30/2014 0253     No results found for: LABOPIA, COCAINSCRNUR, LABBENZ, AMPHETMU, THCU, LABBARB   RADIOLOGY STUDIES: Ct Abdomen Pelvis W Contrast  09/30/2014   CLINICAL DATA:  Patient presents with rectal bleeding. Recent hospitalization for rectal bleeding. EGD 09/26/2014 negative. Abdominal pain and suprapubic pain. Mild nausea.  EXAM: CT ABDOMEN AND PELVIS WITH CONTRAST  TECHNIQUE: Multidetector CT imaging of the abdomen and pelvis was performed using the standard protocol following bolus administration  of intravenous contrast.  CONTRAST:  152mL OMNIPAQUE IOHEXOL 300 MG/ML  SOLN  COMPARISON:  10/26/2006  FINDINGS: Lung bases are within normal.  Cardiac pacer leads are present.  Abdominal images demonstrate evidence of previous cholecystectomy. There is dilatation of the common bile duct measuring 1.7 cm with mild prominence of the central intrahepatic ducts likely due to the postcholecystectomy state. No focal mass or stone seen within the common bile duct. 4 mm  hypodensity over the body of the pancreas unchanged. The spleen, liver and adrenal glands are within normal. Kidneys are normal size without hydronephrosis or nephrolithiasis. There is a 1.5 cm cyst over the upper pole of the left kidney and 1.2 cm cyst over the upper pole of the right kidney. Ureters are within normal. Appendix is not visualized. There is moderate calcified plaque over the abdominal aorta and iliac arteries. 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 There is minimal diverticulosis of the colon.  There is mild reversal of the a normal relationship of the SMA and SMV as the SMV is slightly anterior and to the left of the SMA, although this unchanged. Slight increased number of small bowel loops in the right upper quadrant although unchanged. These findings can be seen with an internal hernia. No evidence of bowel obstruction. There is a heterogeneous extraluminal collection immediately posterior to several small bowel loops in the right mid abdomen and located just inferior to the right kidney measuring 1.8 x 4.6 cm. This may represent a hemorrhagic collection. There is no evidence of bowel perforation/free air.  Pelvic images demonstrate a normal bladder, prostate and rectum. There is hardware over the right femur intact. Mild degenerate change of the spine and left hip.  IMPRESSION: Heterogeneous collection measuring 1.8 x 4.6 cm over the posterior right mid abdomen immediately posterior to several small bowel loops and inferior to the right kidney. This likely represents a small hemorrhagic collection with etiology uncertain. No evidence of free peritoneal air or bowel obstruction.  Stable reversal of the SMA to SMV relationship with slight increased number of small bowel loops in the right upper quadrant as may be due to internal hernia, however is unchanged from the prior exam.  Post cholecystectomy prominence of the common bowel duct.  Stable 4 mm hypodensity over the body of the pancreas unchanged from 2008.  Bilateral renal cysts.  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  These results were called by telephone at the time of interpretation on 09/30/2014 at 1:32 pm to Dr. Tana Coast, who verbally acknowledged these results.   Electronically Signed   By: Marin Olp M.D.   On: 09/30/2014 13:33    ENDOSCOPIC STUDIES: Per HPI.   IMPRESSION:   *  Lower GIB. R/o diverticular bleed. Rule out vascular malformation. CT showing possible hemorrhage right abdomen, (stable) internal hernia ?Marland Kitchen  However pt has no abdominal pain.     *  Normocytic anemia.   *  Gastriitis, duodenitis on 4/18 EGD.  Serum H pylori Ab pending.  Source of melena not found.  Not on PPI at discharge though once daily PPI was GI rec. .   *  A fib, chronic Xarelto.    PLAN:     *  Daily oral PPI. Trend H & H. *  Colonoscopy.  ? Timing. Cardiology rec is to wait 48 hours from last dose before proceduring. His last dose was probably on 4/21  Azucena Freed  09/30/2014, 2:09 PM Pager: [phone]

## 2014-09-30 NOTE — Progress Notes (Signed)
Occupational Therapy Evaluation Patient Details Name: [name] MRN: [id number] DOB: 10/11/1945 Today's Date: 09/30/2014    History of Present Illness [name] is a 69 y.o. 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   Clinical Impression   Patient reports he is at baseline with ADLs. He was independent with bed mobility, modified independent to stand and take steps near [facility] to simulate transfer to w/c. Uses w/c at baseline for mobility and is independent with transfers. He was able to reach all body parts and don/doff socks independently. No OT needs identified. States he has all necessary equipment at his ALF. ALF assists with IADLs such as medication management, laundry, cleaning room, meals. No further OT needs at this time. Will sign off.    Follow Up Recommendations  No OT follow up;Other (comment) (back to ALF)    Equipment Recommendations  None recommended by OT    Recommendations for Other Services PT consult     Precautions / Restrictions Precautions Precautions: Fall Restrictions Weight Bearing Restrictions: No      Mobility Bed Mobility Overal bed mobility: Independent                Transfers                      Balance                                            ADL Overall ADL's : Modified independent                                       General ADL Comments: Patient moved to EOB modified independent. Able to reach all body parts from seated position. Donned/doffed socks independently. Sit to stand supervision/modified independent and took steps at bedside to simulate transfer. At baseline, patient is independent with transfers,  uses w/c for mobility, independent with ADLs.     Vision     Perception     Praxis       Pertinent Vitals/Pain Pain Assessment: 0-10 Pain Score: 6  Pain Location: stomach Pain Descriptors / Indicators: Aching Pain Intervention(s): Monitored during session;Limited activity within patient's tolerance     Hand Dominance Right   Extremity/Trunk Assessment Upper Extremity Assessment Upper Extremity Assessment: Overall WFL for tasks assessed   Lower Extremity Assessment Lower Extremity Assessment: Defer to PT evaluation   Cervical / Trunk Assessment Cervical / Trunk Assessment: Normal   Communication Communication Communication: No difficulties   Cognition Arousal/Alertness: Awake/alert Behavior During Therapy: WFL for tasks assessed/performed Overall Cognitive Status: Within Functional Limits for tasks assessed                     General Comments       Exercises       Shoulder Instructions      Home Living Family/patient expects to be discharged to:: Assisted living                             Home Equipment: Walker - 2 wheels;Wheelchair - manual;Grab bars - tub/shower;Grab bars -  toilet;Toilet riser;Shower seat          Prior Functioning/Environment Level of Independence: Independent with assistive device(s)             OT Diagnosis: Generalized weakness   OT Problem List:     OT Treatment/Interventions:      OT Goals(Current goals can be found in the care plan section) Acute Rehab OT Goals Patient Stated Goal: to go back to ALF OT Goal Formulation: All assessment and education complete, DC therapy  OT Frequency:     Barriers to D/C:            Co-evaluation              End of Session    Activity Tolerance: Patient tolerated treatment well Patient left: in bed;with call bell/phone within reach;with bed alarm set   Time: 1020-1030 OT Time Calculation (min): 10 min Charges:  OT General Charges $OT Visit: 1 Procedure OT Evaluation $Initial OT Evaluation Tier I: 1 Procedure G-Codes:    Abem Shaddix  A 10-06-14, 11:32 AM

## 2014-09-30 NOTE — Progress Notes (Signed)
PT Cancellation Note and Discharge  Patient Details Name: [name] MRN: [id number] DOB: [birth year]   Cancelled Treatment:    Reason Eval/Treat Not Completed: PT screened, no needs identified, will sign off. Pt was at a modified independent to an independent level with transfers during OT eval. Pt uses a w/c at baseline and has all the necessary equipment at ALF. Pt reports that he does not need PT services at this time. If needs change, please reconsult.    Rolinda Roan 09/30/2014, 2:39 PM   Rolinda Roan, PT, DPT Acute Rehabilitation Services Pager: [phone]

## 2014-09-30 NOTE — Progress Notes (Signed)
Utilization review completed.  

## 2014-10-01 ENCOUNTER — Encounter (HOSPITAL_COMMUNITY): Payer: Self-pay

## 2014-10-01 ENCOUNTER — Encounter (HOSPITAL_COMMUNITY)
Admission: EM | Disposition: A | Discharge status: Nursing Home (Includes ICF) | Payer: Self-pay | Source: Home / Self Care | Attending: Internal Medicine

## 2014-10-01 DIAGNOSIS — K639 Disease of intestine, unspecified: Secondary | ICD-10-CM

## 2014-10-01 DIAGNOSIS — K6389 Other specified diseases of intestine: Secondary | ICD-10-CM

## 2014-10-01 DIAGNOSIS — R198 Other specified symptoms and signs involving the digestive system and abdomen: Secondary | ICD-10-CM

## 2014-10-01 DIAGNOSIS — K921 Melena: Secondary | ICD-10-CM | POA: Diagnosis present

## 2014-10-01 DIAGNOSIS — K6289 Other specified diseases of anus and rectum: Secondary | ICD-10-CM

## 2014-10-01 DIAGNOSIS — D125 Benign neoplasm of sigmoid colon: Secondary | ICD-10-CM

## 2014-10-01 DIAGNOSIS — D123 Benign neoplasm of transverse colon: Secondary | ICD-10-CM

## 2014-10-01 HISTORY — PX: COLONOSCOPY: SHX5424

## 2014-10-01 LAB — BASIC METABOLIC PANEL
Anion gap: 9 (ref 5–15)
BUN: 10 mg/dL (ref 6–23)
CO2: 22 mmol/L (ref 19–32)
Calcium: 8.7 mg/dL (ref 8.4–10.5)
Chloride: 110 mmol/L (ref 96–112)
Creatinine, Ser: 0.71 mg/dL (ref 0.50–1.35)
GFR calc Af Amer: >90 mL/min (ref >90)
GFR calc non Af Amer: >90 mL/min (ref >90)
GLUCOSE: 125 mg/dL — AB (ref 70–99)
Potassium: 4.4 mmol/L (ref 3.5–5.1)
Sodium: 141 mmol/L (ref 135–145)

## 2014-10-01 LAB — HEMOGLOBIN AND HEMATOCRIT, BLOOD
HEMATOCRIT: 30.1 % — AB (ref 39.0–52.0)
HEMATOCRIT: 28.7 % — AB (ref 39.0–52.0)
HEMOGLOBIN: 9.2 g/dL — AB (ref 13.0–17.0)
Hemoglobin: 9.6 g/dL — ABNORMAL LOW (ref 13.0–17.0)

## 2014-10-01 LAB — GLUCOSE, CAPILLARY: Glucose-Capillary: 103 mg/dL — ABNORMAL HIGH (ref 70–99)

## 2014-10-01 SURGERY — COLONOSCOPY
Anesthesia: Moderate Sedation

## 2014-10-01 MED ORDER — GUAIFENESIN-DM 100-10 MG/5ML PO SYRP
5.0000 mL | ORAL_SOLUTION | ORAL | Status: DC | PRN
  Administered 2014-10-01: 5 mL via ORAL
  Filled 2014-10-01: qty 5

## 2014-10-01 MED ORDER — FENTANYL CITRATE (PF) 100 MCG/2ML IJ SOLN
INTRAMUSCULAR | Status: DC | PRN
  Administered 2014-10-01 (×4): 25 ug via INTRAVENOUS

## 2014-10-01 MED ORDER — MIDAZOLAM HCL 5 MG/ML IJ SOLN
INTRAMUSCULAR | Status: AC
  Filled 2014-10-01: qty 1

## 2014-10-01 MED ORDER — MIDAZOLAM HCL 5 MG/5ML IJ SOLN
INTRAMUSCULAR | Status: DC | PRN
Start: 2014-10-01 — End: 2014-10-01
  Administered 2014-10-01 (×6): 1 mg via INTRAVENOUS

## 2014-10-01 MED ORDER — SPOT INK MARKER SYRINGE KIT
PACK | SUBMUCOSAL | Status: DC | PRN
  Administered 2014-10-01: 4 mL via SUBMUCOSAL

## 2014-10-01 MED ORDER — FENTANYL CITRATE (PF) 100 MCG/2ML IJ SOLN
INTRAMUSCULAR | Status: AC
  Filled 2014-10-01: qty 2

## 2014-10-01 MED ORDER — SPOT INK MARKER SYRINGE KIT
PACK | SUBMUCOSAL | Status: AC
  Filled 2014-10-01: qty 5

## 2014-10-01 NOTE — Progress Notes (Addendum)
[facility]                                                                              Patient Demographics  [name], is a 69 y.o. male, DOB - [birth year], OFB:[id number]  Admit date - 09/29/2014   Admitting Physician Ivor Costa, MD  Outpatient Primary MD for the patient is [name], MD  LOS - 2   Chief Complaint  Patient presents with  . 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 Patient was recently hospitalized from 4/16 to 4/18 because of GI bleeding. He had EGD on 4/18, which was negative for active bleeding. GI recommended proceeding with a colonoscopy, however since bleeding had resolved, patient refuse colonoscopy. His Xarelto was restarted on 4/19. He started having rectal bleeding again on the day of admission. He had 4 times of rectal bleeding with bright red blood per patient. He denied any lightheadedness, chest pain or shortness breath. Patient reports having severe abdominal pain over suprapubic area. He has mild nausea, but no vomiting or diarrhea.   Assessment & Plan    Principal Problem:   GIB (gastrointestinal bleeding)/lower GI bleed - Recent EGD on 4/18 did not reveal any foci of bleeding - Continue to hold xarelto, perhaps he is not a good candidate for anticoagulation at this point with recurrent GI bleeding  - Continue IV PPI, GI consulted, recommending colonoscopy today 4/23, continue clear liquid diet, serial H&H Addendum: 2:35PM Called by Dr Hilarie Fredrickson: Cscope showed cecal mass 4x4cm, rectal mass 4x5cm - called surgery consult, discussed with Dr Hulen Skains - CEA ordered  Intraabdominal bleed/retroperitoneal hemorrhage: Likely from [place] - CT abdomen and pelvis done yesterday for abdominal pain showed 1.8 x 4.6 cm collection over the posterior  right mid abdomen immediately posterior to several small bowel loops and inferior to the right kidney. This likely represents a small hemorrhagic collection with etiology uncertain. No evidence of free peritoneal air or bowel obstruction. - will continue to hold xarelto, likely not a good candidate for anticoagulation at this point with retroperitoneal hemorrhage and GI bleeding  Suprapubic Abdominal pain: unclear etiology - UA negative for UTI  Chronic atrial fibrillation - CHADS2-VASc Score is 2, patient is on xarelto, perhaps he is not a good candidate for antiplatelet medication at this point with recurrent GI bleeding and retroperitoneal hemorrhage. - Will continue to hold xarelto at discharge and patient will need to discuss with his primary care physician or his cardiologist regarding further decision about anticoagulation - Continue Coreg  DEPRESSION: -  Stable. No suicidal or homicidal ideations. - Continue  Zoloft.   Thrombocytopenia: Chronic - Currently close to baseline, follow CBC.   BPH: Stable -Continue with doxazosin and finasteride.  Protein-calorie malnutrition:  - Nutrition consult, will place on nutritional supplements  Chronic ischemic heart disease:  - Patient was on both aspirin and anticoagulation before. ASA was discontinued permanently in previous admssion - hold Xarelto -continue coreg and pravastatin  Hyperlipidemia:  - Continue pravastatin - Lipid panel showed LDL 62, cholesterol 110  Hypertension: Well-controlled -Continue Coreg and lisinopril   Code Status: Full code  Family Communication: Discussed in detail with the patient, all imaging results, lab results explained to the patient    Disposition Plan: Awaiting colonoscopy, possibly DC tomorrow  Time Spent in minutes   25 minutes  Procedures  none  Consults   Gastroenterology  DVT Prophylaxis  SCD's  Medications  Scheduled Meds: . calcium-vitamin D  1 tablet Oral Daily  .  carvedilol  12.5 mg Oral BID WC  . diltiazem  120 mg Oral Daily  . doxazosin  4 mg Oral Daily  . finasteride  5 mg Oral Daily  . lisinopril  5 mg Oral Daily  . magnesium oxide  400 mg Oral BID  . pantoprazole  40 mg Oral Q0600  . pravastatin  10 mg Oral QHS  . sertraline  50 mg Oral q morning - 10a  . sodium chloride  3 mL Intravenous Q12H  . zolpidem  5 mg Oral QHS   Continuous Infusions: . sodium chloride 20 mL/hr at 09/30/14 1746   PRN Meds:.docusate sodium, hydrOXYzine, morphine injection, traMADol   Antibiotics   Anti-infectives    None        Subjective:   [name] was seen and examined today. Complaining of suprapubic discomfort otherwise no fevers or chills or ongoing rectal bleeding. Patient denies dizziness, chest pain, shortness of breath, N/V/D/C, new weakness, numbess, tingling. No acute events overnight.     Objective:   Blood pressure 113/51, pulse 51, temperature 98.8 F (37.1 C), temperature source Oral, resp. rate 18, height 5\' 9"  (1.753 m), weight 46.4 kg (102 lb 4.7 oz), SpO2 97 %.  Wt Readings from Last 3 Encounters:  09/30/14 46.4 kg (102 lb 4.7 oz)  05/31/13 50.077 kg (110 lb 6.4 oz)  12/24/06 42.661 kg (94 lb 0.8 oz)     Intake/Output Summary (Last 24 hours) at 10/01/14 1116 Last data filed at 10/01/14 0730  Gross per 24 hour  Intake 1388.84 ml  Output   1350 ml  Net  38.84 ml    Exam  General: Alert and oriented x 3, NAD  HEENT:  PERRLA, EOMI, Anicteic Sclera, mucous membranes moist.   Neck: Supple, no JVD, no masses  CVS: S1 S2 auscultated, no mrg  Respiratory: CTAB  Abdomen: Soft, tender in the suprapubic region,ND, NBS  Ext: no cyanosis clubbing or edema  Neuro: AAOx3, Cr N's II- XII. Strength 5/5 upper and lower extremities bilaterally  Skin: No rashes  Psych: Normal affect and demeanor, alert and oriented x3    Data Review   Micro Results Recent Results (from the past 240 hour(s))  MRSA PCR Screening      Status: None   Collection Time: 09/30/14  1:10 AM  Result Value Ref Range Status   MRSA by PCR NEGATIVE NEGATIVE Final    Comment:        The GeneXpert MRSA Assay (FDA approved for NASAL specimens only), is one component of a comprehensive MRSA colonization surveillance program. It is not intended to diagnose MRSA infection nor to guide or monitor treatment for MRSA infections.     Radiology Reports Dg Chest 2 View  09/21/2014   CLINICAL DATA:  Left-sided chest pain for 4 days.  EXAM: CHEST  2 VIEW  COMPARISON:  05/27/2013  FINDINGS: Unchanged positioning of dual-chamber pacer leads from the right. Negative cardiomediastinal silhouette. Streaky right  infrahilar opacity. There is no edema, consolidation, effusion, or pneumothorax.  IMPRESSION: 1. No acute findings. 2. Mild scarring or atelectasis in the right infrahilar lung.   Electronically Signed   By: Monte Fantasia M.D.   On: 09/21/2014 22:29   Ct Abdomen Pelvis W Contrast  09/30/2014   CLINICAL DATA:  Patient presents with rectal bleeding. Recent hospitalization for rectal bleeding. EGD 09/26/2014 negative. Abdominal pain and suprapubic pain. Mild nausea.  EXAM: CT ABDOMEN AND PELVIS WITH CONTRAST  TECHNIQUE: Multidetector CT imaging of the abdomen and pelvis was performed using the standard protocol following bolus administration of intravenous contrast.  CONTRAST:  172mL OMNIPAQUE IOHEXOL 300 MG/ML  SOLN  COMPARISON:  10/26/2006  FINDINGS: Lung bases are within normal.  Cardiac pacer leads are present.  Abdominal images demonstrate evidence of previous cholecystectomy. There is dilatation of the common bile duct measuring 1.7 cm with mild prominence of the central intrahepatic ducts likely due to the postcholecystectomy state. No focal mass or stone seen within the common bile duct. 4 mm hypodensity over the body of the pancreas unchanged. The spleen, liver and adrenal glands are within normal. Kidneys are normal size without  hydronephrosis or nephrolithiasis. There is a 1.5 cm cyst over the upper pole of the left kidney and 1.2 cm cyst over the upper pole of the right kidney. Ureters are within normal. Appendix is not visualized. There is moderate calcified plaque over the abdominal aorta and iliac arteries. 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 There is minimal diverticulosis of the colon.  There is mild reversal of the a normal relationship of the SMA and SMV as the SMV is slightly anterior and to the left of the SMA, although this unchanged. Slight increased number of small bowel loops in the right upper quadrant although unchanged. These findings can be seen with an internal hernia. No evidence of bowel obstruction. There is a heterogeneous extraluminal collection immediately posterior to several small bowel loops in the right mid abdomen and located just inferior to the right kidney measuring 1.8 x 4.6 cm. This may represent a hemorrhagic collection. There is no evidence of bowel perforation/free air.  Pelvic images demonstrate a normal bladder, prostate and rectum. There is hardware over the right femur intact. Mild degenerate change of the spine and left hip.  IMPRESSION: Heterogeneous collection measuring 1.8 x 4.6 cm over the posterior right mid abdomen immediately posterior to several small bowel loops and inferior to the right kidney. This likely represents a small hemorrhagic collection with etiology uncertain. No evidence of free peritoneal air or bowel obstruction.  Stable reversal of the SMA to SMV relationship with slight increased number of small bowel loops in the right upper quadrant as may be due to internal hernia, however is unchanged from the prior exam.  Post cholecystectomy prominence of the common bowel duct. Stable 4 mm hypodensity over the body of the pancreas unchanged from 2008.  Bilateral renal cysts.  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  These results were called by telephone at the time of interpretation on 09/30/2014 at 1:32 pm to Dr. Tana Coast, who verbally acknowledged these results.   Electronically Signed   By: Marin Olp M.D.   On: 09/30/2014 13:33   Dg Hand 2 View Right  09/25/2014   CLINICAL DATA:  Right hand pain. Slammed door on fingers at home last Sunday, persistent pain.  EXAM: RIGHT  HAND - 2 VIEW  COMPARISON:  None.  FINDINGS: Diffuse bony under mineralization. No fracture or dislocation. The alignment is maintained. 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 There are multiple, at least 4, small linear densities projecting in the soft tissues of the hand overlying the metacarpals consistent with foreign bodies. These measure up to 7 mm in length.  IMPRESSION: 1. No fracture or dislocation of the right hand. 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 2. Multiple, at least 4, small linear densities in the soft tissues of the hand consistent with foreign bodies. Acuity is uncertain.   Electronically Signed   By: Jeb Levering M.D.   On: 09/25/2014 01:36    CBC  Recent Labs Lab 09/24/14 1412  09/26/14 0136 09/26/14 1025 09/29/14 2211 09/30/14 0253 09/30/14 0600 09/30/14 1240 09/30/14 1807 10/01/14 0900  WBC 6.0  < > 5.2 5.9 6.2 6.2 6.0  --   --   --   HGB 10.5*  < > 9.6* 10.2* 9.1* 10.1* 9.8* 9.6* 9.7* 9.6*  HCT 32.6*  < > 30.6* 31.5* 27.9* 31.3* 30.8* 30.0* 30.1* 30.1*  PLT 138*  < > 127* 123* 126* 149* 146*  --   --   --   MCV 98.2  < > 99.0 99.7 98.9 99.1 98.1  --   --   --   MCH 31.6  < > 31.1 32.3 32.3 32.0 31.2  --   --   --   MCHC 32.2  < > 31.4 32.4 32.6 32.3 31.8  --   --   --   RDW 13.9  < > 13.8 13.6 13.8 13.5 13.9  --   --   --   LYMPHSABS 2.3  --   --   --  2.5  --   --   --   --   --   MONOABS 0.4  --   --   --  0.5  --   --   --   --   --   EOSABS 0.1  --   --   --  0.2  --   --   --   --    --   BASOSABS 0.0  --   --   --  0.0  --   --   --   --   --   < > = values in this interval not displayed.  Chemistries   Recent Labs Lab 09/24/14 1412 09/25/14 0115 09/29/14 2211 09/30/14 0600 10/01/14 0448  NA 139 139 137 138 141  K 3.8 4.1 4.4 4.3 4.4  CL 103 105 104 107 110  CO2 28 27 25 24 22   GLUCOSE 94 109* 115* 110* 125*  BUN 19 16 25* 19 10  CREATININE 0.75 0.65 0.69 0.70 0.71  CALCIUM 9.0 8.7 8.8 8.6 8.7  AST 18  --  16 14  --   ALT 15  --  12 13  --   ALKPHOS 98  --  83 81  --   BILITOT 0.3  --  0.3 0.4  --    ------------------------------------------------------------------------------------------------------------------ estimated creatinine clearance is 58.8 mL/min (by C-G formula based on Cr of 0.71). ------------------------------------------------------------------------------------------------------------------ No results for input(s): HGBA1C in the last 72 hours. ------------------------------------------------------------------------------------------------------------------  Recent Labs  09/30/14 0253  CHOL 110  HDL 35*  LDLCALC 62  TRIG 66  CHOLHDL 3.1   ------------------------------------------------------------------------------------------------------------------ No results for input(s): TSH, T4TOTAL, T3FREE,  THYROIDAB in the last 72 hours.  Invalid input(s): FREET3 ------------------------------------------------------------------------------------------------------------------ No results for input(s): VITAMINB12, FOLATE, FERRITIN, TIBC, IRON, RETICCTPCT in the last 72 hours.  Coagulation profile  Recent Labs Lab 09/29/14 2211  INR 1.79*    No results for input(s): DDIMER in the last 72 hours.  Cardiac Enzymes No results for input(s): CKMB, TROPONINI, MYOGLOBIN in the last 168 hours.  Invalid input(s): CK ------------------------------------------------------------------------------------------------------------------ Invalid  input(s): [place]  09/30/14 0810 09/30/14 1622 10/01/14 0630  GLUCAP 90 94 103*     RAI,RIPUDEEP M.D. [facility] 10/01/2014, 11:16 AM  Pager: [phone]   Between 7am to 7pm - call Pager - [phone]  After 7pm go to [email or web address] - password [email or web address]  Call night coverage person covering after 7pm

## 2014-10-01 NOTE — Op Note (Signed)
[facility] [place], [address]   COLONOSCOPY PROCEDURE REPORT  PATIENT: [name], [name]  MR#: [id number] BIRTHDATE: 1946-06-09 , 71  yrs. old GENDER: male ENDOSCOPIST: Jerene Bears, MD REFERRED EG:[facility] PROCEDURE DATE:  10/01/2014 PROCEDURE:   Colonoscopy with snare polypectomy, Colonoscopy with biopsy, Submucosal injection, any substance, and Colonoscopy, diagnostic First Screening Colonoscopy - Avg.  risk and is 50 yrs.  old or older - No.  Prior Negative Screening - Now for repeat screening. N/A  History of Adenoma - Now for follow-up colonoscopy & has been > or = to 3 yrs.  N/A ASA CLASS:   Class III INDICATIONS:hematochezia and anemia, chronic anticoagulation. MEDICATIONS: Fentanyl 100 mcg IV and Versed 7 mg IV  DESCRIPTION OF PROCEDURE:   After the risks benefits and alternatives of the procedure were thoroughly explained, informed consent was obtained.  The digital rectal exam revealed no rectal mass.   The Pentax Ped Colon [id number]  endoscope was introduced through the anus and advanced to the cecum, which was identified by both the appendix and ileocecal valve. No adverse events experienced.   The quality of the prep was (MoviPrep was used) fair.  The instrument was then slowly withdrawn as the colon was fully examined.      COLON FINDINGS: A mass measuring 4 X 4cm in size was found at the cecum.  This mass occupies approximately 50% of the cecum and extends very close to the IC valve. Multiple biopsies of the lesion were performed using cold forceps.  A tattoo was applied distal to this lesion on opposite walls in the ascending colon (5cc [place] Ink).   A sessile polyp ranging from 15 to 8mm in size was found in the distal transverse colon.  Endoscopic mucosal resection was performed by injecting saline (8 cc) into the submucosa to raise the lesion.  Resection was then performed with snare cautery.  The polyp  lifted well after submucosal injection and there was no target sign.  There was no blood loss from polypectomy.   Four sessile polyps ranging between 5-71mm in size were found in the sigmoid colon.  Polypectomies were performed with a cold snare (1) and using snare cautery (3).  The resection was complete, the polyp tissue was completely retrieved and sent to histology.   A half circumferential mass, measuring 4 X 5cm in size, was found in the rectum.  This lesion was not palpable on DRE, and the proximal most extent is no further than 15 cm from the dentate line.  Multiple biopsies of the lesion were performed using cold forceps. Retroflexed views revealed no abnormalities. The time to cecum = 8 min Withdrawal time = 47 min   The scope was withdrawn and the procedure completed.  COMPLICATIONS: There were no immediate complications.  ENDOSCOPIC IMPRESSION: 1.   Mass measuring 4 X 4cm in size was found at the cecum; multiple biopsies of the lesion were performed; a tattoo was applied distally in the ascending colon 2.   Sessile polyp ranging from 15 to 54mm in size was found in the distal transverse colon; endoscopic mucosal resection was performed  3.   Four sessile polyps ranging between 5-25mm in size were found in the sigmoid colon; polypectomies were performed with a cold snare (1) and using snare cautery (3) 4.   Half circumferential mass, measuring 4 X 5cm in size, was found in the rectum; multiple biopsies of the lesion were performed  RECOMMENDATIONS: 1.  Await pathology results 2.  Surgical consultation given cecal and rectal masses not endoscopically resectable 3.  Hold Aspirin, NSAIDs and Xarelto for at least 2 weeks given polypectomy and existing colonic masses  eSigned:  Jerene Bears, MD 10/01/2014 2:19 PM   cc:  the patient   PATIENT NAME:  [name], [name] MR#: [id number]

## 2014-10-02 LAB — BASIC METABOLIC PANEL
Anion gap: 7 (ref 5–15)
BUN: 12 mg/dL (ref 6–23)
CO2: 27 mmol/L (ref 19–32)
Calcium: 8.8 mg/dL (ref 8.4–10.5)
Chloride: 104 mmol/L (ref 96–112)
Creatinine, Ser: 0.73 mg/dL (ref 0.50–1.35)
GFR calc Af Amer: >90 mL/min (ref >90)
GFR calc non Af Amer: >90 mL/min (ref >90)
Glucose, Bld: 134 mg/dL — ABNORMAL HIGH (ref 70–99)
Potassium: 4.5 mmol/L (ref 3.5–5.1)
Sodium: 138 mmol/L (ref 135–145)

## 2014-10-02 LAB — GLUCOSE, CAPILLARY: GLUCOSE-CAPILLARY: 136 mg/dL — AB (ref 70–99)

## 2014-10-02 LAB — HEMOGLOBIN AND HEMATOCRIT, BLOOD
HCT: 31.8 % — ABNORMAL LOW (ref 39.0–52.0)
Hemoglobin: 10.2 g/dL — ABNORMAL LOW (ref 13.0–17.0)

## 2014-10-02 MED ORDER — HYDROMORPHONE HCL 1 MG/ML IJ SOLN: 3.0000 mg | INTRAMUSCULAR | Status: DC | PRN

## 2014-10-02 MED ORDER — HYDROMORPHONE HCL 1 MG/ML IJ SOLN
INTRAMUSCULAR | Status: AC
  Filled 2014-10-02: qty 2

## 2014-10-02 MED ORDER — ONDANSETRON HCL 4 MG/2ML IJ SOLN
4.0000 mg | INTRAMUSCULAR | Status: DC | PRN
  Administered 2014-10-02 – 2014-10-04 (×2): 4 mg via INTRAVENOUS
  Filled 2014-10-02 (×2): qty 2

## 2014-10-02 MED ORDER — HYDROMORPHONE HCL 1 MG/ML IJ SOLN
INTRAMUSCULAR | Status: AC
  Filled 2014-10-02: qty 1

## 2014-10-02 MED ORDER — HYDROMORPHONE HCL 1 MG/ML IJ SOLN
2.0000 mg | INTRAMUSCULAR | Status: DC | PRN
  Administered 2014-10-02 – 2014-10-05 (×9): 2 mg via INTRAVENOUS
  Filled 2014-10-02 (×9): qty 2

## 2014-10-02 MED ORDER — HYDROMORPHONE HCL 1 MG/ML IJ SOLN
1.0000 mg | INTRAMUSCULAR | Status: AC
  Administered 2014-10-02: 1 mg via INTRAVENOUS

## 2014-10-02 NOTE — Progress Notes (Signed)
Daily Rounding Note  10/02/2014, 11:28 AM  LOS: 3 days   SUBJECTIVE:       No BMs or rectal bleeding since colonoscopy prep.  Feels stomach burning, no nausea Tucking into his lunch of BBQ, potatoes, cole slaw.   OBJECTIVE:         Vital signs in last 24 hours:    Temp:  [97.7 F (36.5 C)-99 F (37.2 C)] 98.3 F (36.8 C) (04/24 0546) Pulse Rate:  [25-95] 51 (04/24 0546) Resp:  [11-26] 16 (04/24 0546) BP: (85-150)/(41-85) 106/62 mmHg (04/24 0546) SpO2:  [88 %-100 %] 100 % (04/24 0546) Last BM Date: 10/01/14 Filed Weights   09/29/14 2143 09/30/14 0024  Weight: 101 lb (45.813 kg) 102 lb 4.7 oz (46.4 kg)   General: looks weathered but overall not ill looking   Heart: RRR Chest: clear bil.  No cough or SOB Abdomen: soft, NT, ND.  BS active.   Extremities: no CCE Neuro/Psych:  Oriented x 3.  No gross deficits.  Fully alert.   Intake/Output from previous day: 04/23 0701 - 04/24 0700 In: 660 [P.O.:520; I.V.:140] Out: 1400 [Urine:1400]  Intake/Output this shift: Total I/O In: 520 [P.O.:520] Out: 575 [Urine:575]  Lab Results:  Recent Labs  09/29/14 2211 09/30/14 0253 09/30/14 0600  10/01/14 0900 10/01/14 1850 10/02/14 0733  WBC 6.2 6.2 6.0  --   --   --   --   HGB 9.1* 10.1* 9.8*  < > 9.6* 9.2* 10.2*  HCT 27.9* 31.3* 30.8*  < > 30.1* 28.7* 31.8*  PLT 126* 149* 146*  --   --   --   --   < > = values in this interval not displayed. BMET  Recent Labs  09/30/14 0600 10/01/14 0448 10/02/14 0437  NA 138 141 138  K 4.3 4.4 4.5  CL 107 110 104  CO2 24 22 27   GLUCOSE 110* 125* 134*  BUN 19 10 12   CREATININE 0.70 0.71 0.73  CALCIUM 8.6 8.7 8.8   LFT  Recent Labs  09/29/14 2211 09/30/14 0600  PROT 6.3 5.9*  ALBUMIN 3.3* 3.0*  AST 16 14  ALT 12 13  ALKPHOS 83 81  BILITOT 0.3 0.4   PT/INR  Recent Labs  09/29/14 2211  LABPROT 20.9*  INR 1.79*   Hepatitis Panel No results for input(s):  HEPBSAG, HCVAB, HEPAIGM, HEPBIGM in the last 72 hours.  Studies/Results: Ct Abdomen Pelvis W Contrast  09/30/2014   CLINICAL DATA:  Patient presents with rectal bleeding. Recent hospitalization for rectal bleeding. EGD 09/26/2014 negative. Abdominal pain and suprapubic pain. Mild nausea.  EXAM: CT ABDOMEN AND PELVIS WITH CONTRAST  TECHNIQUE: Multidetector CT imaging of the abdomen and pelvis was performed using the standard protocol following bolus administration of intravenous contrast.  CONTRAST:  173mL OMNIPAQUE IOHEXOL 300 MG/ML  SOLN  COMPARISON:  10/26/2006  FINDINGS: Lung bases are within normal.  Cardiac pacer leads are present.  Abdominal images demonstrate evidence of previous cholecystectomy. There is dilatation of the common bile duct measuring 1.7 cm with mild prominence of the central intrahepatic ducts likely due to the postcholecystectomy state. No focal mass or stone seen within the common bile duct. 4 mm hypodensity over the body of the pancreas unchanged. The spleen, liver and adrenal glands are within normal. Kidneys are normal size without hydronephrosis or nephrolithiasis. There is a 1.5 cm cyst over the upper pole of the left kidney and 1.2 cm cyst over  the upper pole of the right kidney. Ureters are within normal. Appendix is not visualized. There is moderate calcified plaque over the abdominal aorta and iliac arteries. 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 There is minimal diverticulosis of the colon.  There is mild reversal of the a normal relationship of the SMA and SMV as the SMV is slightly anterior and to the left of the SMA, although this unchanged. Slight increased number of small bowel loops in the right upper quadrant although unchanged. These findings can be seen with an internal hernia. No evidence of bowel obstruction. There is a heterogeneous extraluminal collection immediately posterior to several small bowel loops  in the right mid abdomen and located just inferior to the right kidney measuring 1.8 x 4.6 cm. This may represent a hemorrhagic collection. There is no evidence of bowel perforation/free air.  Pelvic images demonstrate a normal bladder, prostate and rectum. There is hardware over the right femur intact. Mild degenerate change of the spine and left hip.  IMPRESSION: Heterogeneous collection measuring 1.8 x 4.6 cm over the posterior right mid abdomen immediately posterior to several small bowel loops and inferior to the right kidney. This likely represents a small hemorrhagic collection with etiology uncertain. No evidence of free peritoneal air or bowel obstruction.  Stable reversal of the SMA to SMV relationship with slight increased number of small bowel loops in the right upper quadrant as may be due to internal hernia, however is unchanged from the prior exam.  Post cholecystectomy prominence of the common bowel duct. Stable 4 mm hypodensity over the body of the pancreas unchanged from 2008.  Bilateral renal cysts.  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  These results were called by telephone at the time of interpretation on 09/30/2014 at 1:32 pm to Dr. Tana Coast, who verbally acknowledged these results.   Electronically Signed   By: Marin Olp M.D.   On: 09/30/2014 13:33    ASSESMENT:   * Lower GIB.  CT showing possible hemorrhage right abdomen, (stable) internal hernia ? However pt has no abdominal pain.  Colonoscopy 4/13: cecal mass biopsied and tattood, rectal mass biopsied, multiple additional large polyps all removed.   * Normocytic anemia.   * Gastriitis, duodenitis on 4/18 EGD. Serum H pylori Ab pending. Source of melena not found. Not on PPI at discharge though once daily PPI was GI rec. .   * A fib, chronic Xarelto.     PLAN   *  Surgeon Dr Ninfa Linden following for possible subtotal colectomy vs 2 separate segmental  resections: depending on path results.   *  GI available prn.  Azucena Freed  10/02/2014, 11:28 AM Pager: [phone]

## 2014-10-02 NOTE — Progress Notes (Signed)
[facility]                                                                              Patient Demographics  [name], is a 69 y.o. male, DOB - Jan 31, 1946, FAO:[id number]  Admit date - 09/29/2014   Admitting Physician Ivor Costa, MD  Outpatient Primary MD for the patient is [name], MD  LOS - 3   Chief Complaint  Patient presents with  . 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 Patient was recently hospitalized from 4/16 to 4/18 because of GI bleeding. He had EGD on 4/18, which was negative for active bleeding. GI recommended proceeding with a colonoscopy, however since bleeding had resolved, patient refuse colonoscopy. His Xarelto was restarted on 4/19. He started having rectal bleeding again on the day of admission. He had 4 times of rectal bleeding with bright red blood per patient. He denied any lightheadedness, chest pain or shortness breath. Patient reports having severe abdominal pain over suprapubic area. He has mild nausea, but no vomiting or diarrhea.   Assessment & Plan    Principal Problem:   GIB (gastrointestinal bleeding)/lower GI bleed - Recent EGD on 4/18 did not reveal any foci of bleeding - Continue to hold xarelto, perhaps he is not a good candidate for anticoagulation at this point with recurrent GI bleeding  - Continue PPI, patient underwent colonoscopy on 4/23, showed a cecal mass 4x 4 cm, rectal mass 4 x 5 cm, CEA pending - Appreciate general surgery consult, await biopsy results    Intraabdominal bleed/retroperitoneal hemorrhage: Likely from xarelto - CT abd pelvis showed 1.8 x 4.6 cm collection over the posterior right mid abdomen immediately posterior to several small bowel loops and inferior to the right kidney, likely retroperitoneal  hematoma - will continue to hold xarelto, likely not a good candidate for anticoagulation at this point with retroperitoneal hemorrhage and GI bleeding and will need further workup regarding cecal, rectal mass  Suprapubic Abdominal pain: unclear etiology, resolved - UA negative for UTI  Chronic atrial fibrillation - CHADS2-VASc Score is 2, patient is on xarelto, perhaps he is not a good candidate for antiplatelet medication at this point with recurrent GI bleeding and retroperitoneal hemorrhage. - Will continue to hold xarelto at discharge and patient will need to discuss with his primary care physician or his cardiologist regarding further decision about anticoagulation - Continue Coreg  DEPRESSION: -  Stable. No suicidal or homicidal ideations. - Continue  Zoloft.   Thrombocytopenia: Chronic - Currently close to baseline, follow CBC.   BPH: Stable -Continue with doxazosin and finasteride.  Protein-calorie malnutrition:  - Nutrition consult, will place on nutritional supplements  Chronic ischemic heart disease:  - Patient was on both aspirin and anticoagulation before. ASA was discontinued permanently in previous admssion - hold Xarelto -continue coreg and pravastatin  Hyperlipidemia:  - Continue pravastatin - Lipid panel showed LDL 62, cholesterol 110  Hypertension: Well-controlled -Continue Coreg and lisinopril  Code Status: Full code  Family Communication: Discussed in detail with the patient, all imaging results, lab results explained to the patient    Disposition Plan: Await biopsy results, further management pending  Time Spent in minutes   25 minutes  Procedures  none  Consults   Gastroenterology  DVT Prophylaxis  SCD's  Medications  Scheduled Meds: . calcium-vitamin D  1 tablet Oral Daily  . carvedilol  12.5 mg Oral BID WC  . diltiazem  120 mg Oral Daily  . doxazosin  4 mg Oral Daily  . finasteride  5 mg Oral Daily  . lisinopril  5 mg Oral  Daily  . magnesium oxide  400 mg Oral BID  . pantoprazole  40 mg Oral Q0600  . pravastatin  10 mg Oral QHS  . sertraline  50 mg Oral q morning - 10a  . sodium chloride  3 mL Intravenous Q12H  . zolpidem  5 mg Oral QHS   Continuous Infusions: . sodium chloride 20 mL/hr at 09/30/14 1746   PRN Meds:.docusate sodium, guaiFENesin-dextromethorphan, hydrOXYzine, morphine injection, traMADol   Antibiotics   Anti-infectives    None        Subjective:   [name] was seen and examined today. Patient denies dizziness, chest pain, shortness of breath, N/V/D/C, new weakness, numbess, tingling. No acute events overnight.  Tolerating diet, no abdominal pain today   Objective:   Blood pressure 106/62, pulse 51, temperature 98.3 F (36.8 C), temperature source Oral, resp. rate 16, height 5\' 9"  (1.753 m), weight 46.4 kg (102 lb 4.7 oz), SpO2 100 %.  Wt Readings from Last 3 Encounters:  09/30/14 46.4 kg (102 lb 4.7 oz)  05/31/13 50.077 kg (110 lb 6.4 oz)  12/24/06 42.661 kg (94 lb 0.8 oz)     Intake/Output Summary (Last 24 hours) at 10/02/14 1122 Last data filed at 10/02/14 0813  Gross per 24 hour  Intake   1180 ml  Output   1975 ml  Net   -795 ml    Exam  General: Alert and oriented x 3, NAD  HEENT:  PERRLA, EOMI, Anicteic Sclera  Neck: Supple, no JVD  CVS: S1 S2 auscultated, no mrg  Respiratory: CTAB  Abdomen: Soft, NT ND, NBS  Ext: no cyanosis clubbing or edema  Neuro: AAOx3, Cr N's II- XII. Strength 5/5 upper and lower extremities bilaterally  Skin: No rashes  Psych: Normal affect and demeanor, alert and oriented x3    Data Review   Micro Results Recent Results (from the past 240 hour(s))  MRSA PCR Screening     Status: None   Collection Time: 09/30/14  1:10 AM  Result Value Ref Range Status   MRSA by PCR NEGATIVE NEGATIVE Final    Comment:        The GeneXpert MRSA Assay (FDA approved for NASAL specimens only), is one component of a comprehensive  MRSA colonization surveillance program. It is not intended to diagnose MRSA infection nor to guide or monitor treatment for MRSA infections.     Radiology Reports Dg Chest 2 View  09/21/2014   CLINICAL DATA:  Left-sided chest pain for 4 days.  EXAM: CHEST  2 VIEW  COMPARISON:  05/27/2013  FINDINGS: Unchanged positioning of dual-chamber pacer leads from the right. Negative cardiomediastinal silhouette. Streaky right infrahilar opacity. There is no edema, consolidation, effusion, or pneumothorax.  IMPRESSION: 1. No acute findings. 2. Mild scarring or atelectasis in the right infrahilar lung.   Electronically Signed   By: Angelica Chessman  Watts M.D.   On: 09/21/2014 22:29   Ct Abdomen Pelvis W Contrast  09/30/2014   CLINICAL DATA:  Patient presents with rectal bleeding. Recent hospitalization for rectal bleeding. EGD 09/26/2014 negative. Abdominal pain and suprapubic pain. Mild nausea.  EXAM: CT ABDOMEN AND PELVIS WITH CONTRAST  TECHNIQUE: Multidetector CT imaging of the abdomen and pelvis was performed using the standard protocol following bolus administration of intravenous contrast.  CONTRAST:  117mL OMNIPAQUE IOHEXOL 300 MG/ML  SOLN  COMPARISON:  10/26/2006  FINDINGS: Lung bases are within normal.  Cardiac pacer leads are present.  Abdominal images demonstrate evidence of previous cholecystectomy. There is dilatation of the common bile duct measuring 1.7 cm with mild prominence of the central intrahepatic ducts likely due to the postcholecystectomy state. No focal mass or stone seen within the common bile duct. 4 mm hypodensity over the body of the pancreas unchanged. The spleen, liver and adrenal glands are within normal. Kidneys are normal size without hydronephrosis or nephrolithiasis. There is a 1.5 cm cyst over the upper pole of the left kidney and 1.2 cm cyst over the upper pole of the right kidney. Ureters are within normal. Appendix is not visualized. There is moderate calcified plaque over the  abdominal aorta and iliac arteries. 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 There is minimal diverticulosis of the colon.  There is mild reversal of the a normal relationship of the SMA and SMV as the SMV is slightly anterior and to the left of the SMA, although this unchanged. Slight increased number of small bowel loops in the right upper quadrant although unchanged. These findings can be seen with an internal hernia. No evidence of bowel obstruction. There is a heterogeneous extraluminal collection immediately posterior to several small bowel loops in the right mid abdomen and located just inferior to the right kidney measuring 1.8 x 4.6 cm. This may represent a hemorrhagic collection. There is no evidence of bowel perforation/free air.  Pelvic images demonstrate a normal bladder, prostate and rectum. There is hardware over the right femur intact. Mild degenerate change of the spine and left hip.  IMPRESSION: Heterogeneous collection measuring 1.8 x 4.6 cm over the posterior right mid abdomen immediately posterior to several small bowel loops and inferior to the right kidney. This likely represents a small hemorrhagic collection with etiology uncertain. No evidence of free peritoneal air or bowel obstruction.  Stable reversal of the SMA to SMV relationship with slight increased number of small bowel loops in the right upper quadrant as may be due to internal hernia, however is unchanged from the prior exam.  Post cholecystectomy prominence of the common bowel duct. Stable 4 mm hypodensity over the body of the pancreas unchanged from 2008.  Bilateral renal cysts.  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  These results were called by telephone at the time of interpretation on 09/30/2014 at 1:32 pm to Dr. Tana Coast, who verbally acknowledged these results.   Electronically Signed   By: Marin Olp M.D.   On:  09/30/2014 13:33   Dg Hand 2 View Right  09/25/2014   CLINICAL DATA:  Right hand pain. Slammed door on fingers at home last Sunday, persistent pain.  EXAM: RIGHT HAND - 2 VIEW  COMPARISON:  None.  FINDINGS: Diffuse bony under mineralization. No fracture or dislocation. The alignment is maintained. There is osteoarthritis of base of the thumb with joint space narrowing and  proliferative change. There are multiple, at least 4, small linear densities projecting in the soft tissues of the hand overlying the metacarpals consistent with foreign bodies. These measure up to 7 mm in length.  IMPRESSION: 1. No fracture or dislocation of the right hand. 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 2. Multiple, at least 4, small linear densities in the soft tissues of the hand consistent with foreign bodies. Acuity is uncertain.   Electronically Signed   By: Jeb Levering M.D.   On: 09/25/2014 01:36    CBC  Recent Labs Lab 09/26/14 0136 09/26/14 1025 09/29/14 2211 09/30/14 0253 09/30/14 0600 09/30/14 1240 09/30/14 1807 10/01/14 0900 10/01/14 1850 10/02/14 0733  WBC 5.2 5.9 6.2 6.2 6.0  --   --   --   --   --   HGB 9.6* 10.2* 9.1* 10.1* 9.8* 9.6* 9.7* 9.6* 9.2* 10.2*  HCT 30.6* 31.5* 27.9* 31.3* 30.8* 30.0* 30.1* 30.1* 28.7* 31.8*  PLT 127* 123* 126* 149* 146*  --   --   --   --   --   MCV 99.0 99.7 98.9 99.1 98.1  --   --   --   --   --   MCH 31.1 32.3 32.3 32.0 31.2  --   --   --   --   --   MCHC 31.4 32.4 32.6 32.3 31.8  --   --   --   --   --   RDW 13.8 13.6 13.8 13.5 13.9  --   --   --   --   --   LYMPHSABS  --   --  2.5  --   --   --   --   --   --   --   MONOABS  --   --  0.5  --   --   --   --   --   --   --   EOSABS  --   --  0.2  --   --   --   --   --   --   --   BASOSABS  --   --  0.0  --   --   --   --   --   --   --     Chemistries   Recent Labs Lab 09/29/14 2211 09/30/14 0600 10/01/14 0448 10/02/14 0437  NA 137 138 141 138  K 4.4 4.3 4.4 4.5  CL 104 107 110 104  CO2 25  24 22 27   GLUCOSE 115* 110* 125* 134*  BUN 25* 19 10 12   CREATININE 0.69 0.70 0.71 0.73  CALCIUM 8.8 8.6 8.7 8.8  AST 16 14  --   --   ALT 12 13  --   --   ALKPHOS 83 81  --   --   BILITOT 0.3 0.4  --   --    ------------------------------------------------------------------------------------------------------------------ estimated creatinine clearance is 58.8 mL/min (by C-G formula based on Cr of 0.73). ------------------------------------------------------------------------------------------------------------------ No results for input(s): HGBA1C in the last 72 hours. ------------------------------------------------------------------------------------------------------------------  Recent Labs  09/30/14 0253  CHOL 110  HDL 35*  LDLCALC 62  TRIG 66  CHOLHDL 3.1   ------------------------------------------------------------------------------------------------------------------ No results for input(s): TSH, T4TOTAL, T3FREE, THYROIDAB in the last 72 hours.  Invalid input(s): FREET3 ------------------------------------------------------------------------------------------------------------------ No results for input(s): VITAMINB12, FOLATE, FERRITIN, TIBC, IRON, RETICCTPCT in the last 72 hours.  Coagulation profile  Recent Labs Lab 09/29/14 2211  INR 1.79*  No results for input(s): DDIMER in the last 72 hours.  Cardiac Enzymes No results for input(s): CKMB, TROPONINI, MYOGLOBIN in the last 168 hours.  Invalid input(s): CK ------------------------------------------------------------------------------------------------------------------ Invalid input(s): Gray  09/30/14 0810 09/30/14 1622 10/01/14 0630 10/02/14 0545  GLUCAP 90 94 103* 136*     RAI,RIPUDEEP M.D. [facility] 10/02/2014, 11:22 AM  Pager: [phone]   Between 7am to 7pm - call Pager - [phone]  After 7pm go to [email or web address] - password [email or web address]  Call night coverage person  covering after 7pm

## 2014-10-02 NOTE — Consult Note (Signed)
Reason for Consult:Colon masses Referring Physician: Dr. Ellard Goodwin is an 69 y.o. male.  HPI: We have been asked to see this pleasant gentleman after colonoscopy for GI bleed revealed a mass in the cecum as well as in his rectum. He also had multiple polyps throughout his colon which were removed. The gentleman originally presented last week with a lower GI bleed on blood thinning medication. Medication stopped and then the bleeding stopped as well. Once the medication was resumed, he began bleeding again and presented back to the hospital with a low hemoglobin. Again, he now has no further bleeding with his medication on hold. He is also had a CAT scan of the abdomen and pelvis showing a for severe fluid collection below the kidney. There is also a question of a small internal hernia. The patient currently reports mild right lower quadrant abdominal pain. He has had this discomfort for about a week. He denies a fall. He has no nausea or vomiting and is moving his bowels well and eating. He has multiple chronic medical problems.  Past Medical History  Diagnosis Date  . Depression   . Hypomagnesemia   . Dyspepsia   . Fall at nursing home 05/27/2013    "slipped on water; said I broke my right hip" (05/27/2013)  . Migraine     "used to have them bad; haven't had one in awhile" (05/27/2013)  . Gout attack     "not too long ago; in my right foot" (05/27/2013)  . Complete heart block     reason for pacemaker/notes 05/27/2013  . Atrial fibrillation     [name] 05/27/2013  . HLD (hyperlipidemia)   . GIB (gastrointestinal bleeding)   . BPH (benign prostatic hyperplasia)     Past Surgical History  Procedure Laterality Date  . Insert / replace / remove pacemaker    . Appendectomy    . Cholecystectomy    . Open reduction of hip Right 05/28/2013    Procedure: OPEN REDUCTION OF HIP;  Surgeon: Renette Butters, MD;  Location: [facility];  Service: Orthopedics;  Laterality: Right;  .  Esophagogastroduodenoscopy (egd) with propofol N/A 09/26/2014    Procedure: ESOPHAGOGASTRODUODENOSCOPY (EGD) WITH PROPOFOL;  Surgeon: Jerene Bears, MD;  Location: [facility] ENDOSCOPY;  Service: Endoscopy;  Laterality: N/A;    Family History  Problem Relation Age of Onset  . Colon cancer Mother     Social History:  reports that he has quit smoking. His smoking use included Cigarettes. He has a 15.25 pack-year smoking history. He has quit using smokeless tobacco. His smokeless tobacco use included Snuff and Chew. He reports that he drinks alcohol. His drug history is not on file.  Allergies: No Known Allergies  Medications: I have reviewed the patient's current medications.  Results for orders placed or performed during the hospital encounter of 09/29/14 (from the past 48 hour(s))  Glucose, capillary     Status: None   Collection Time: 09/30/14  8:10 AM  Result Value Ref Range   Glucose-Capillary 90 70 - 99 mg/dL   Comment 1 Notify RN   Hemoglobin and hematocrit, blood     Status: Abnormal   Collection Time: 09/30/14 12:40 PM  Result Value Ref Range   Hemoglobin 9.6 (L) 13.0 - 17.0 g/dL   HCT 30.0 (L) 39.0 - 52.0 %  Glucose, capillary     Status: None   Collection Time: 09/30/14  4:22 PM  Result Value Ref Range   Glucose-Capillary 94 70 -  99 mg/dL   Comment 1 Notify RN    Comment 2 Document in Chart   Hemoglobin and hematocrit, blood     Status: Abnormal   Collection Time: 09/30/14  6:07 PM  Result Value Ref Range   Hemoglobin 9.7 (L) 13.0 - 17.0 g/dL   HCT 30.1 (L) 39.0 - 46.9 %  Basic metabolic panel     Status: Abnormal   Collection Time: 10/01/14  4:48 AM  Result Value Ref Range   Sodium 141 135 - 145 mmol/L   Potassium 4.4 3.5 - 5.1 mmol/L   Chloride 110 96 - 112 mmol/L   CO2 22 19 - 32 mmol/L   Glucose, Bld 125 (H) 70 - 99 mg/dL   BUN 10 6 - 23 mg/dL   Creatinine, Ser 0.71 0.50 - 1.35 mg/dL   Calcium 8.7 8.4 - 10.5 mg/dL   GFR calc non Af Amer >90 >90 mL/min   GFR calc Af  Amer >90 >90 mL/min    Comment: (NOTE) The eGFR has been calculated using the CKD EPI equation. This calculation has not been validated in all clinical situations. eGFR's persistently <90 mL/min signify possible Chronic Kidney Disease.    Anion gap 9 5 - 15  Glucose, capillary     Status: Abnormal   Collection Time: 10/01/14  6:30 AM  Result Value Ref Range   Glucose-Capillary 103 (H) 70 - 99 mg/dL  Hemoglobin and hematocrit, blood     Status: Abnormal   Collection Time: 10/01/14  9:00 AM  Result Value Ref Range   Hemoglobin 9.6 (L) 13.0 - 17.0 g/dL   HCT 30.1 (L) 39.0 - 52.0 %  Hemoglobin and hematocrit, blood     Status: Abnormal   Collection Time: 10/01/14  6:50 PM  Result Value Ref Range   Hemoglobin 9.2 (L) 13.0 - 17.0 g/dL   HCT 28.7 (L) 39.0 - 62.9 %  Basic metabolic panel     Status: Abnormal   Collection Time: 10/02/14  4:37 AM  Result Value Ref Range   Sodium 138 135 - 145 mmol/L   Potassium 4.5 3.5 - 5.1 mmol/L   Chloride 104 96 - 112 mmol/L   CO2 27 19 - 32 mmol/L   Glucose, Bld 134 (H) 70 - 99 mg/dL   BUN 12 6 - 23 mg/dL   Creatinine, Ser 0.73 0.50 - 1.35 mg/dL   Calcium 8.8 8.4 - 10.5 mg/dL   GFR calc non Af Amer >90 >90 mL/min   GFR calc Af Amer >90 >90 mL/min    Comment: (NOTE) The eGFR has been calculated using the CKD EPI equation. This calculation has not been validated in all clinical situations. eGFR's persistently <90 mL/min signify possible Chronic Kidney Disease.    Anion gap 7 5 - 15  Glucose, capillary     Status: Abnormal   Collection Time: 10/02/14  5:45 AM  Result Value Ref Range   Glucose-Capillary 136 (H) 70 - 99 mg/dL    Ct Abdomen Pelvis W Contrast  09/30/2014   CLINICAL DATA:  Patient presents with rectal bleeding. Recent hospitalization for rectal bleeding. EGD 09/26/2014 negative. Abdominal pain and suprapubic pain. Mild nausea.  EXAM: CT ABDOMEN AND PELVIS WITH CONTRAST  TECHNIQUE: Multidetector CT imaging of the abdomen and pelvis  was performed using the standard protocol following bolus administration of intravenous contrast.  CONTRAST:  135m OMNIPAQUE IOHEXOL 300 MG/ML  SOLN  COMPARISON:  10/26/2006  FINDINGS: Lung bases are within normal.  Cardiac pacer  leads are present.  Abdominal images demonstrate evidence of previous cholecystectomy. There is dilatation of the common bile duct measuring 1.7 cm with mild prominence of the central intrahepatic ducts likely due to the postcholecystectomy state. No focal mass or stone seen within the common bile duct. 4 mm hypodensity over the body of the pancreas unchanged. The spleen, liver and adrenal glands are within normal. Kidneys are normal size without hydronephrosis or nephrolithiasis. There is a 1.5 cm cyst over the upper pole of the left kidney and 1.2 cm cyst over the upper pole of the right kidney. Ureters are within normal. Appendix is not visualized. There is moderate calcified plaque over the abdominal aorta and iliac arteries. 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 There is minimal diverticulosis of the colon.  There is mild reversal of the a normal relationship of the SMA and SMV as the SMV is slightly anterior and to the left of the SMA, although this unchanged. Slight increased number of small bowel loops in the right upper quadrant although unchanged. These findings can be seen with an internal hernia. No evidence of bowel obstruction. There is a heterogeneous extraluminal collection immediately posterior to several small bowel loops in the right mid abdomen and located just inferior to the right kidney measuring 1.8 x 4.6 cm. This may represent a hemorrhagic collection. There is no evidence of bowel perforation/free air.  Pelvic images demonstrate a normal bladder, prostate and rectum. There is hardware over the right femur intact. Mild degenerate change of the spine and left hip.  IMPRESSION: Heterogeneous collection  measuring 1.8 x 4.6 cm over the posterior right mid abdomen immediately posterior to several small bowel loops and inferior to the right kidney. This likely represents a small hemorrhagic collection with etiology uncertain. No evidence of free peritoneal air or bowel obstruction.  Stable reversal of the SMA to SMV relationship with slight increased number of small bowel loops in the right upper quadrant as may be due to internal hernia, however is unchanged from the prior exam.  Post cholecystectomy prominence of the common bowel duct. Stable 4 mm hypodensity over the body of the pancreas unchanged from 2008.  Bilateral renal cysts.  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  These results were called by telephone at the time of interpretation on 09/30/2014 at 1:32 pm to Dr. Tana Coast, who verbally acknowledged these results.   Electronically Signed   By: Marin Olp M.D.   On: 09/30/2014 13:33    Review of Systems  All other systems reviewed and are negative.  Blood pressure 106/62, pulse 51, temperature 98.3 F (36.8 C), temperature source Oral, resp. rate 16, height _0  (1.753 m), weight 46.4 kg (102 lb 4.7 oz), SpO2 100 %. Physical Exam  Constitutional: He is oriented to person, place, and time. He appears well-developed and well-nourished. No distress.  Thin appearing elderly gentleman  HENT:  Head: Normocephalic and atraumatic.  Right Ear: External ear normal.  Left Ear: External ear normal.  Nose: Nose normal.  Mouth/Throat: Oropharynx is clear and moist. No oropharyngeal exudate.  Eyes: Conjunctivae are normal. Pupils are equal, round, and reactive to light. No scleral icterus.  Neck: Normal range of motion. Neck supple. No tracheal deviation present.  Cardiovascular: Normal rate.   Irregular rhythm  Respiratory: Effort normal and breath sounds normal. He has no wheezes.  GI: Soft.  Very mild RLQ tenderness  Musculoskeletal:  Normal  range of motion. He exhibits no edema or tenderness.  Lymphadenopathy:    He has no cervical adenopathy.  Neurological: He is alert and oriented to person, place, and time.  Skin: Skin is warm and dry. No rash noted. He is not diaphoretic. No erythema.  Psychiatric: His behavior is normal. Judgment normal.    Assessment/Plan: 1. Cecal rectal mass with multiple polyps as well. Pathology is pending on these masses. They were not resectable by endoscopy. He may require a subtotal colectomy versus 2 separate segmental resections. We'll make this decision which pathology is back. His blood thinning medication will continue to be held.  2. Fluid collection in the right abdomen.  I suspect this may be a small retroperitoneal hematoma from his blood thinning medication. This may be evaluated the time of surgery versus a repeat CT scan in about a week. Currently he remains hemodynamic stable.    [name] A 10/02/2014, 8:03 AM

## 2014-10-03 LAB — BASIC METABOLIC PANEL
Anion gap: 7 (ref 5–15)
BUN: 25 mg/dL — ABNORMAL HIGH (ref 6–23)
CHLORIDE: 101 mmol/L (ref 96–112)
CO2: 26 mmol/L (ref 19–32)
CREATININE: 0.76 mg/dL (ref 0.50–1.35)
Calcium: 8.8 mg/dL (ref 8.4–10.5)
GFR calc Af Amer: >90 mL/min (ref >90)
Glucose, Bld: 109 mg/dL — ABNORMAL HIGH (ref 70–99)
Potassium: 6 mmol/L — ABNORMAL HIGH (ref 3.5–5.1)
Sodium: 134 mmol/L — ABNORMAL LOW (ref 135–145)

## 2014-10-03 LAB — GLUCOSE, CAPILLARY: Glucose-Capillary: 102 mg/dL — ABNORMAL HIGH (ref 70–99)

## 2014-10-03 LAB — HELICOBACTER PYLORI ABS-IGG+IGA, BLD
H Pylori IgA: <0.9
H Pylori IgG: <0.9

## 2014-10-03 LAB — CEA: CEA: 2.9 ng/mL (ref 0.0–4.7)

## 2014-10-03 MED ORDER — NICOTINE 21 MG/24HR TD PT24
21.0000 mg | MEDICATED_PATCH | Freq: Every day | TRANSDERMAL | Status: DC
  Administered 2014-10-03 – 2014-10-06 (×3): 21 mg via TRANSDERMAL
  Filled 2014-10-03 (×4): qty 1

## 2014-10-03 MED ORDER — SODIUM POLYSTYRENE SULFONATE 15 GM/60ML PO SUSP
30.0000 g | Freq: Once | ORAL | Status: AC
  Administered 2014-10-03: 30 g via ORAL
  Filled 2014-10-03: qty 120

## 2014-10-03 NOTE — Progress Notes (Signed)
[facility] Progress Note  2 Days Post-Op  Subjective: Pt notes lower abdominal pain.  No N/V, tolerating clears, but appetite is low.  Says he has trouble ambulating due to a bad leg.  Having some flatus, BM yesterday.    Objective: Vital signs in last 24 hours: Temp:  [97.7 F (36.5 C)-98.4 F (36.9 C)] 98.4 F (36.9 C) (04/25 0537) Pulse Rate:  [44-73] 56 (04/25 0537) Resp:  [16-18] 16 (04/25 0537) BP: (104-125)/(48-63) 120/53 mmHg (04/25 0537) SpO2:  [94 %-100 %] 95 % (04/25 0537) Last BM Date: 10/02/14  Intake/Output from previous day: 04/24 0701 - 04/25 0700 In: 880 [P.O.:880] Out: 2305 [Urine:2305] Intake/Output this shift:    PE: Gen:  Alert, NAD, pleasant Abd: Soft, tender in the RLQ and LLQ and suprapubic area, ND, +BS, no HSM   Lab Results:   Recent Labs  10/01/14 1850 10/02/14 0733  HGB 9.2* 10.2*  HCT 28.7* 31.8*   BMET  Recent Labs  10/02/14 0437 10/03/14 0525  NA 138 134*  K 4.5 6.0*  CL 104 101  CO2 27 26  GLUCOSE 134* 109*  BUN 12 25*  CREATININE 0.73 0.76  CALCIUM 8.8 8.8   PT/INR No results for input(s): LABPROT, INR in the last 72 hours. CMP     Component Value Date/Time   NA 134* 10/03/2014 0525   K 6.0* 10/03/2014 0525   CL 101 10/03/2014 0525   CO2 26 10/03/2014 0525   GLUCOSE 109* 10/03/2014 0525   BUN 25* 10/03/2014 0525   CREATININE 0.76 10/03/2014 0525   CALCIUM 8.8 10/03/2014 0525   PROT 5.9* 09/30/2014 0600   ALBUMIN 3.0* 09/30/2014 0600   AST 14 09/30/2014 0600   ALT 13 09/30/2014 0600   ALKPHOS 81 09/30/2014 0600   BILITOT 0.4 09/30/2014 0600   GFRNONAA >90 10/03/2014 0525   GFRAA >90 10/03/2014 0525   Lipase     Component Value Date/Time   LIPASE 40 09/30/2014 0253       Studies/Results: No results found.  Anti-infectives: Anti-infectives    None       Assessment/Plan 1. LGI bleed secondary - Cecal & rectal mass with multiple polyps as well. Pathology is pending on these  masses. They were not resectable by endoscopy. He may require a subtotal colectomy versus 2 separate segmental resections. Will likely need an ostomy.  We'll make this decision which pathology is back. His blood thinner medication will continue to be held.  2. Fluid collection in the right abdomen. I suspect this may be a small retroperitoneal hematoma from his blood thinner medication. This may be evaluated the time of surgery versus a repeat CT scan in about a week. Currently he remains hemodynamic stable.   3.  Gastritis, duodenitis 4.  AFIB on Xarelto 5.  Normocytic anemia ? Abl anemia   LOS: 4 days    DORT, Jinny Blossom 10/03/2014, 8:04 AM Pager: [phone]

## 2014-10-03 NOTE — Progress Notes (Signed)
[facility]                                                                              Patient Demographics  [name], is a 69 y.o. male, DOB - December 16, 1945, PJA:[id number]  Admit date - 09/29/2014   Admitting Physician Ivor Costa, MD  Outpatient Primary MD for the patient is [name], MD  LOS - 4   Chief Complaint  Patient presents with  . 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 Patient was recently hospitalized from 4/16 to 4/18 because of GI bleeding. He had EGD on 4/18, which was negative for active bleeding. GI recommended proceeding with a colonoscopy, however since bleeding had resolved, patient refuse colonoscopy. His Xarelto was restarted on 4/19. He started having rectal bleeding again on the day of admission. He had 4 times of rectal bleeding with bright red blood per patient. He denied any lightheadedness, chest pain or shortness breath. Patient reports having severe abdominal pain over suprapubic area. He has mild nausea, but no vomiting or diarrhea.   Assessment & Plan    Principal Problem:   GIB (gastrointestinal bleeding)/lower GI bleed - Recent EGD on 4/18 did not reveal any foci of bleeding - Continue to hold xarelto, perhaps he is not a good candidate for anticoagulation at this point with recurrent GI bleeding  - Continue PPI, patient underwent colonoscopy on 4/23, showed a cecal mass 4x 4 cm, rectal mass 4 x 5 cm, CEA pending - Appreciate general surgery consult, awaiting biopsy results    Intraabdominal bleed/retroperitoneal hemorrhage: Likely from xarelto - CT abd pelvis showed 1.8 x 4.6 cm collection over the posterior right mid abdomen immediately posterior to several small bowel loops and inferior to the right kidney, likely retroperitoneal  hematoma - will continue to hold xarelto, likely not a good candidate for anticoagulation at this point with retroperitoneal hemorrhage and GI bleeding and will need further workup regarding cecal, rectal mass  Suprapubic Abdominal pain: unclear etiology, resolved - UA negative for UTI  Chronic atrial fibrillation - CHADS2-VASc Score is 2, patient is on xarelto, perhaps he is not a good candidate for antiplatelet medication at this point with recurrent GI bleeding and retroperitoneal hemorrhage. - Will continue to hold xarelto at discharge and patient will need to discuss with his primary care physician or his cardiologist regarding further decision about anticoagulation - Continue Coreg  Hyperkalemia - Hold lisinopril, hold magnesium, 1 dose of Kayexalate 30 g 1  DEPRESSION: -  Stable. No suicidal or homicidal ideations. - Continue  Zoloft.   Thrombocytopenia: Chronic - Currently close to baseline  BPH: Stable -Continue with doxazosin and finasteride.  Protein-calorie malnutrition:  - Nutrition consult, will place on nutritional supplements  Chronic ischemic heart disease:  - Patient was on both aspirin and anticoagulation before. ASA was discontinued permanently in previous admssion - hold Xarelto -continue coreg and pravastatin  Hyperlipidemia:  - Continue pravastatin - Lipid panel showed  LDL 62, cholesterol 110  Hypertension: Well-controlled -Continue Coreg   Code Status: Full code  Family Communication: Discussed in detail with the patient, all imaging results, lab results explained to the patient    Disposition Plan: Await biopsy results, further management pending  Time Spent in minutes   25 minutes  Procedures  none  Consults   Gastroenterology  DVT Prophylaxis  SCD's  Medications  Scheduled Meds: . calcium-vitamin D  1 tablet Oral Daily  . carvedilol  12.5 mg Oral BID WC  . diltiazem  120 mg Oral Daily  . doxazosin  4 mg Oral Daily  .  finasteride  5 mg Oral Daily  . lisinopril  5 mg Oral Daily  . magnesium oxide  400 mg Oral BID  . nicotine  21 mg Transdermal Daily  . pantoprazole  40 mg Oral Q0600  . pravastatin  10 mg Oral QHS  . sertraline  50 mg Oral q morning - 10a  . sodium chloride  3 mL Intravenous Q12H  . zolpidem  5 mg Oral QHS   Continuous Infusions: . sodium chloride 75 mL/hr (10/02/14 1845)   PRN Meds:.docusate sodium, guaiFENesin-dextromethorphan, HYDROmorphone (DILAUDID) injection, HYDROmorphone (DILAUDID) injection, hydrOXYzine, ondansetron (ZOFRAN) IV, traMADol   Antibiotics   Anti-infectives    None        Subjective:   [name] was seen and examined today. Yesterday evening had abdominal pain with vomiting episode, resolved, patient wants to be on solid diet.  Patient denies dizziness, chest pain, shortness of breath, N/V/D/C, new weakness, numbess, tingling. States no abdominal pain today, no nausea or vomiting   Objective:   Blood pressure 121/59, pulse 56, temperature 98.4 F (36.9 C), temperature source Oral, resp. rate 16, height 5\' 9"  (1.753 m), weight 46.4 kg (102 lb 4.7 oz), SpO2 95 %.  Wt Readings from Last 3 Encounters:  09/30/14 46.4 kg (102 lb 4.7 oz)  05/31/13 50.077 kg (110 lb 6.4 oz)  12/24/06 42.661 kg (94 lb 0.8 oz)     Intake/Output Summary (Last 24 hours) at 10/03/14 1101 Last data filed at 10/03/14 0900  Gross per 24 hour  Intake    600 ml  Output   1730 ml  Net  -1130 ml    Exam  General: Alert and oriented x 3, NAD  HEENT:  PERRLA, EOMI, Anicteic Sclera  Neck: Supple, no JVD  CVS: S1 S2 clear no murmur rubs or gallops  Respiratory: CTAB  Abdomen: Soft, nontender, nondistended, normal bowel sounds  Ext: no cyanosis clubbing or edema  Neuro: AAOx3, Cr N's II- XII. Strength 5/5 upper and lower extremities bilaterally  Skin: No rashes  Psych: Normal affect and demeanor, alert and oriented x3    Data Review   Micro Results Recent  Results (from the past 240 hour(s))  MRSA PCR Screening     Status: None   Collection Time: 09/30/14  1:10 AM  Result Value Ref Range Status   MRSA by PCR NEGATIVE NEGATIVE Final    Comment:        The GeneXpert MRSA Assay (FDA approved for NASAL specimens only), is one component of a comprehensive MRSA colonization surveillance program. It is not intended to diagnose MRSA infection nor to guide or monitor treatment for MRSA infections.     Radiology Reports Dg Chest 2 View  09/21/2014   CLINICAL DATA:  Left-sided chest pain for 4 days.  EXAM: CHEST  2 VIEW  COMPARISON:  05/27/2013  FINDINGS: Unchanged positioning of  dual-chamber pacer leads from the right. Negative cardiomediastinal silhouette. Streaky right infrahilar opacity. There is no edema, consolidation, effusion, or pneumothorax.  IMPRESSION: 1. No acute findings. 2. Mild scarring or atelectasis in the right infrahilar lung.   Electronically Signed   By: Monte Fantasia M.D.   On: 09/21/2014 22:29   Ct Abdomen Pelvis W Contrast  09/30/2014   CLINICAL DATA:  Patient presents with rectal bleeding. Recent hospitalization for rectal bleeding. EGD 09/26/2014 negative. Abdominal pain and suprapubic pain. Mild nausea.  EXAM: CT ABDOMEN AND PELVIS WITH CONTRAST  TECHNIQUE: Multidetector CT imaging of the abdomen and pelvis was performed using the standard protocol following bolus administration of intravenous contrast.  CONTRAST:  157mL OMNIPAQUE IOHEXOL 300 MG/ML  SOLN  COMPARISON:  10/26/2006  FINDINGS: Lung bases are within normal.  Cardiac pacer leads are present.  Abdominal images demonstrate evidence of previous cholecystectomy. There is dilatation of the common bile duct measuring 1.7 cm with mild prominence of the central intrahepatic ducts likely due to the postcholecystectomy state. No focal mass or stone seen within the common bile duct. 4 mm hypodensity over the body of the pancreas unchanged. The spleen, liver and adrenal glands  are within normal. Kidneys are normal size without hydronephrosis or nephrolithiasis. There is a 1.5 cm cyst over the upper pole of the left kidney and 1.2 cm cyst over the upper pole of the right kidney. Ureters are within normal. Appendix is not visualized. There is moderate calcified plaque over the abdominal aorta and iliac arteries. 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 There is minimal diverticulosis of the colon.  There is mild reversal of the a normal relationship of the SMA and SMV as the SMV is slightly anterior and to the left of the SMA, although this unchanged. Slight increased number of small bowel loops in the right upper quadrant although unchanged. These findings can be seen with an internal hernia. No evidence of bowel obstruction. There is a heterogeneous extraluminal collection immediately posterior to several small bowel loops in the right mid abdomen and located just inferior to the right kidney measuring 1.8 x 4.6 cm. This may represent a hemorrhagic collection. There is no evidence of bowel perforation/free air.  Pelvic images demonstrate a normal bladder, prostate and rectum. There is hardware over the right femur intact. Mild degenerate change of the spine and left hip.  IMPRESSION: Heterogeneous collection measuring 1.8 x 4.6 cm over the posterior right mid abdomen immediately posterior to several small bowel loops and inferior to the right kidney. This likely represents a small hemorrhagic collection with etiology uncertain. No evidence of free peritoneal air or bowel obstruction.  Stable reversal of the SMA to SMV relationship with slight increased number of small bowel loops in the right upper quadrant as may be due to internal hernia, however is unchanged from the prior exam.  Post cholecystectomy prominence of the common bowel duct. Stable 4 mm hypodensity over the body of the pancreas unchanged from 2008.  Bilateral renal  cysts.  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  These results were called by telephone at the time of interpretation on 09/30/2014 at 1:32 pm to Dr. Tana Coast, who verbally acknowledged these results.   Electronically Signed   By: Marin Olp M.D.   On: 09/30/2014 13:33   Dg Hand 2 View Right  09/25/2014   CLINICAL DATA:  Right hand pain. Slammed door  on fingers at home last Sunday, persistent pain.  EXAM: RIGHT HAND - 2 VIEW  COMPARISON:  None.  FINDINGS: Diffuse bony under mineralization. No fracture or dislocation. The alignment is maintained. 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 There are multiple, at least 4, small linear densities projecting in the soft tissues of the hand overlying the metacarpals consistent with foreign bodies. These measure up to 7 mm in length.  IMPRESSION: 1. No fracture or dislocation of the right hand. 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 2. Multiple, at least 4, small linear densities in the soft tissues of the hand consistent with foreign bodies. Acuity is uncertain.   Electronically Signed   By: Jeb Levering M.D.   On: 09/25/2014 01:36    CBC  Recent Labs Lab 09/29/14 2211 09/30/14 0253 09/30/14 0600 09/30/14 1240 09/30/14 1807 10/01/14 0900 10/01/14 1850 10/02/14 0733  WBC 6.2 6.2 6.0  --   --   --   --   --   HGB 9.1* 10.1* 9.8* 9.6* 9.7* 9.6* 9.2* 10.2*  HCT 27.9* 31.3* 30.8* 30.0* 30.1* 30.1* 28.7* 31.8*  PLT 126* 149* 146*  --   --   --   --   --   MCV 98.9 99.1 98.1  --   --   --   --   --   MCH 32.3 32.0 31.2  --   --   --   --   --   MCHC 32.6 32.3 31.8  --   --   --   --   --   RDW 13.8 13.5 13.9  --   --   --   --   --   LYMPHSABS 2.5  --   --   --   --   --   --   --   MONOABS 0.5  --   --   --   --   --   --   --   EOSABS 0.2  --   --   --   --   --   --   --   BASOSABS 0.0  --   --   --   --   --   --   --      Chemistries   Recent Labs Lab 09/29/14 2211 09/30/14 0600 10/01/14 0448 10/02/14 0437 10/03/14 0525  NA 137 138 141 138 134*  K 4.4 4.3 4.4 4.5 6.0*  CL 104 107 110 104 101  CO2 25 24 22 27 26   GLUCOSE 115* 110* 125* 134* 109*  BUN 25* 19 10 12  25*  CREATININE 0.69 0.70 0.71 0.73 0.76  CALCIUM 8.8 8.6 8.7 8.8 8.8  AST 16 14  --   --   --   ALT 12 13  --   --   --   ALKPHOS 83 81  --   --   --   BILITOT 0.3 0.4  --   --   --    ------------------------------------------------------------------------------------------------------------------ estimated creatinine clearance is 58.8 mL/min (by C-G formula based on Cr of 0.76). ------------------------------------------------------------------------------------------------------------------ No results for input(s): HGBA1C in the last 72 hours. ------------------------------------------------------------------------------------------------------------------ No results for input(s): CHOL, HDL, LDLCALC, TRIG, CHOLHDL, LDLDIRECT in the last 72 hours. ------------------------------------------------------------------------------------------------------------------ No results for input(s): TSH, T4TOTAL, T3FREE, THYROIDAB in the last 72 hours.  Invalid input(s): FREET3 ------------------------------------------------------------------------------------------------------------------ No results for input(s): VITAMINB12, FOLATE, FERRITIN, TIBC, IRON, RETICCTPCT in the last  72 hours.  Coagulation profile  Recent Labs Lab 09/29/14 2211  INR 1.79*    No results for input(s): DDIMER in the last 72 hours.  Cardiac Enzymes No results for input(s): CKMB, TROPONINI, MYOGLOBIN in the last 168 hours.  Invalid input(s): CK ------------------------------------------------------------------------------------------------------------------ Invalid input(s): POCBNP   Recent Labs  09/30/14 1622 10/01/14 0630 10/02/14 0545 10/03/14 0534   GLUCAP 94 103* 136* 24*     RAI,RIPUDEEP M.D. [facility] 10/03/2014, 11:01 AM  Pager: [phone]   Between 7am to 7pm - call Pager - [phone]  After 7pm go to [email or web address] - password [email or web address]  Call night coverage person covering after 7pm

## 2014-10-04 ENCOUNTER — Encounter (HOSPITAL_COMMUNITY): Payer: Self-pay | Admitting: Internal Medicine

## 2014-10-04 LAB — CBC
HCT: 30 % — ABNORMAL LOW (ref 39.0–52.0)
Hemoglobin: 9.7 g/dL — ABNORMAL LOW (ref 13.0–17.0)
MCH: 31.5 pg (ref 26.0–34.0)
MCHC: 32.3 g/dL (ref 30.0–36.0)
MCV: 97.4 fL (ref 78.0–100.0)
Platelets: 122 10*3/uL — ABNORMAL LOW (ref 150–400)
RBC: 3.08 MIL/uL — AB (ref 4.22–5.81)
RDW: 13.7 % (ref 11.5–15.5)
WBC: 11.6 10*3/uL — AB (ref 4.0–10.5)

## 2014-10-04 LAB — BASIC METABOLIC PANEL
Anion gap: 8 (ref 5–15)
BUN: 17 mg/dL (ref 6–23)
CALCIUM: 8.2 mg/dL — AB (ref 8.4–10.5)
CO2: 25 mmol/L (ref 19–32)
Chloride: 102 mmol/L (ref 96–112)
Creatinine, Ser: 0.56 mg/dL (ref 0.50–1.35)
GFR calc Af Amer: >90 mL/min (ref >90)
GFR calc non Af Amer: >90 mL/min (ref >90)
GLUCOSE: 109 mg/dL — AB (ref 70–99)
Potassium: 3.6 mmol/L (ref 3.5–5.1)
SODIUM: 135 mmol/L (ref 135–145)

## 2014-10-04 LAB — GLUCOSE, CAPILLARY: Glucose-Capillary: 102 mg/dL — ABNORMAL HIGH (ref 70–99)

## 2014-10-04 MED ORDER — SODIUM POLYSTYRENE SULFONATE 15 GM/60ML PO SUSP: 30.0000 g | Freq: Once | ORAL | Status: DC

## 2014-10-04 MED ORDER — BOOST / RESOURCE BREEZE PO LIQD
1.0000 | Freq: Three times a day (TID) | ORAL | Status: DC
  Administered 2014-10-04 (×2): 1 via ORAL

## 2014-10-04 MED ORDER — DEXTROSE 5 % IV SOLN
2.0000 g | INTRAVENOUS | Status: AC
  Administered 2014-10-05: 2 g via INTRAVENOUS
  Filled 2014-10-04: qty 2

## 2014-10-04 MED ORDER — POLYETHYLENE GLYCOL 3350 17 G PO PACK
17.0000 g | PACK | Freq: Two times a day (BID) | ORAL | Status: DC
  Administered 2014-10-04 (×2): 17 g via ORAL
  Filled 2014-10-04 (×4): qty 1

## 2014-10-04 NOTE — Progress Notes (Addendum)
INITIAL NUTRITION ASSESSMENT  DOCUMENTATION CODES Per approved criteria  -Severe malnutrition in the context of chronic illness -Underweight   INTERVENTION: Resource Breeze po TID, each supplement provides 250 kcal and 9 grams of protein RD to follow for nutrition care plan  NUTRITION DIAGNOSIS: Increased nutrient needs related to malnutrition, repletion as evidenced by estimated nutrition needs   Goal: Pt to meet >/= 90% of their estimated nutrition needs   Monitor:  PO & supplemental intake, weight, labs, I/O's  Reason for Assessment: Consult  69 y.o. male  Admitting Dx: GIB (gastrointestinal bleeding)  ASSESSMENT: 69 y.o. Male with past medical history of hyperlipidemia, gout, depression, pacemaker placement secondary to complete AV block, atrial fibrillation on Xarelto, history of GI bleeding, BPH, who presents with rectal bleeding.  RD consulted for poor PO intake.  Patient reports his appetite is ok.  PO intake poor at 10-20% per flowsheet records.  He states he doesn't like Ensure supplements as they make him "sick".  Pt with visible severe muscle and fat depletion.  Pt identified with malnutrition during previous hospital admission -- ongoing.  Will order Resource Breeze to have him try.  Nutrition Focused Physical Exam:  Subcutaneous Fat:  Orbital Region: N/A Upper Arm Region: severe depletion Thoracic and Lumbar Region: severe depletion  Muscle:  Temple Region: N/A Clavicle Bone Region: severe depletion Clavicle and Acromion Bone Region: severe depletion Scapular Bone Region: N/A Dorsal Hand: N/A Patellar Region: severe depletion Anterior Thigh Region: severe depletion Posterior Calf Region: severe depletion  Edema: none  Patient continues to meet criteria for severe malnutrition in the context of chronic illness as evidenced by severe muscle and fat depletion.  Height: Ht Readings from Last 1 Encounters:  09/30/14 5\' 9"  (1.753 m)    Weight: Wt  Readings from Last 1 Encounters:  09/30/14 102 lb 4.7 oz (46.4 kg)    Ideal Body Weight: 160 lb  % Ideal Body Weight: 64%  Wt Readings from Last 10 Encounters:  09/30/14 102 lb 4.7 oz (46.4 kg)  05/31/13 110 lb 6.4 oz (50.077 kg)  12/24/06 94 lb 0.8 oz (42.661 kg)    Usual Body Weight: ---  % Usual Body Weight: ---  BMI:  Body mass index is 15.1 kg/(m^2).  Estimated Nutritional Needs: Kcal: 1500-1700 Protein: 70-80 gm Fluid: 1.5-1.7 L  Skin: Intact  Diet Order: Diet clear liquid Room service appropriate?: Yes; Fluid consistency:: Thin Diet NPO time specified Except for: Sips with Meds  EDUCATION NEEDS: -No education needs identified at this time   Intake/Output Summary (Last 24 hours) at 10/04/14 1435 Last data filed at 10/04/14 1300  Gross per 24 hour  Intake    360 ml  Output   1200 ml  Net   -840 ml    Labs:   Recent Labs Lab 10/02/14 0437 10/03/14 0525 10/04/14 0550  NA 138 134* 135  K 4.5 6.0* 3.6  CL 104 101 102  CO2 27 26 25   BUN 12 25* 17  CREATININE 0.73 0.76 0.56  CALCIUM 8.8 8.8 8.2*  GLUCOSE 134* 109* 109*    CBG (last 3)   Recent Labs  10/02/14 0545 10/03/14 0534 10/04/14 0636  GLUCAP 136* 102* 102*    Scheduled Meds: . calcium-vitamin D  1 tablet Oral Daily  . carvedilol  12.5 mg Oral BID WC  . diltiazem  120 mg Oral Daily  . doxazosin  4 mg Oral Daily  . finasteride  5 mg Oral Daily  . nicotine  21  mg Transdermal Daily  . pantoprazole  40 mg Oral Q0600  . polyethylene glycol  17 g Oral BID  . pravastatin  10 mg Oral QHS  . sertraline  50 mg Oral q morning - 10a  . sodium chloride  3 mL Intravenous Q12H  . zolpidem  5 mg Oral QHS    Continuous Infusions: . sodium chloride 75 mL/hr at 10/03/14 2015    Past Medical History  Diagnosis Date  . Depression   . Hypomagnesemia   . Dyspepsia   . Fall at nursing home 05/27/2013    "slipped on water; said I broke my right hip" (05/27/2013)  . Migraine     "used to  have them bad; haven't had one in awhile" (05/27/2013)  . Gout attack     "not too long ago; in my right foot" (05/27/2013)  . Complete heart block     reason for pacemaker/notes 05/27/2013  . Atrial fibrillation     [name] 05/27/2013  . HLD (hyperlipidemia)   . GIB (gastrointestinal bleeding)   . BPH (benign prostatic hyperplasia)     Past Surgical History  Procedure Laterality Date  . Insert / replace / remove pacemaker    . Appendectomy    . Cholecystectomy    . Open reduction of hip Right 05/28/2013    Procedure: OPEN REDUCTION OF HIP;  Surgeon: Renette Butters, MD;  Location: [facility];  Service: Orthopedics;  Laterality: Right;  . Esophagogastroduodenoscopy (egd) with propofol N/A 09/26/2014    Procedure: ESOPHAGOGASTRODUODENOSCOPY (EGD) WITH PROPOFOL;  Surgeon: Jerene Bears, MD;  Location: [facility] ENDOSCOPY;  Service: Endoscopy;  Laterality: N/A;  . Colonoscopy N/A 10/01/2014    Procedure: COLONOSCOPY;  Surgeon: Jerene Bears, MD;  Location: [facility] ENDOSCOPY;  Service: Endoscopy;  Laterality: N/A;    Arthur Holms, RD, LDN Pager #: [phone] After-Hours Pager #: [phone]

## 2014-10-04 NOTE — Progress Notes (Signed)
Medicare Important Message given? YES  (If response is "NO", the following Medicare IM given date fields will be blank)  Date Medicare IM given: 10/04/14 Medicare IM given by:  Dahlia Client [employer]

## 2014-10-04 NOTE — Clinical Social Work Note (Signed)
Clinical Social Work Assessment  Patient Details  Name: [name] MRN: [id number] Date of Birth: 1946/06/09  Date of referral:  10/04/14               Reason for consult:  Facility Placement                Permission sought to share information with:  Facility Art therapist granted to share information::  Yes, Release of Information Signed  Name::        Agency::  [facility]  Relationship::  [phone]  Contact Information:     Housing/Transportation Living arrangements for the past 2 months:  [place] of Information:  Patient Patient Interpreter Needed:  None Criminal Activity/Legal Involvement Pertinent to Current Situation/Hospitalization:  No - Comment as needed Significant Relationships:  None Lives with:  Facility Resident Do you feel safe going back to the place where you live?  Yes Need for family participation in patient care:  No (Coment)  Care giving concerns:  None- pt feels cared for at [facility]- has been a resident for 11 years   Social Worker assessment / plan:  CSW spoke with pt about returning to ALF at time of DC.  Employment status:  Retired Forensic scientist:  Information systems manager, Medicaid In [place] PT Recommendations:   (back to ALF) Information / Referral to community resources:     Patient/Family's Response to care:  Pt agreeable to returning to ALF  Patient/Family's Understanding of and Emotional Response to Diagnosis, Current Treatment, and Prognosis:  Pt expresses a good understanding of medical condition/prognosis  Emotional Assessment Appearance:  Appears stated age Attitude/Demeanor/Rapport:    Affect (typically observed):  Appropriate, Calm Orientation:  Oriented to Self, Oriented to Place, Oriented to  Time, Oriented to Situation Alcohol / Substance use:  Not Applicable Psych involvement (Current and /or in the community):  No (Comment)  Discharge Needs  Concerns to be addressed:  No discharge  needs identified Readmission within the last 30 days:  Yes Current discharge risk:  None Barriers to Discharge:  Continued Medical Work up   [employer], LCSW 10/04/2014, 2:17 PM

## 2014-10-04 NOTE — Progress Notes (Signed)
[facility] Progress Note  3 Days Post-Op  Subjective: Pt feels better.  Pain controlled, but quite tender in lower abdomen.  No N/V, but anorexic.  Tolerates clears.  Ambulating some.  Having BM's and flatus.  Objective: Vital signs in last 24 hours: Temp:  [99 F (37.2 C)-99.6 F (37.6 C)] 99 F (37.2 C) (04/26 0446) Pulse Rate:  [49-77] 77 (04/25 1953) Resp:  [18] 18 (04/26 0446) BP: (105-127)/(59-67) 106/67 mmHg (04/26 0446) SpO2:  [96 %-99 %] 96 % (04/26 0446) Last BM Date: 10/04/14  Intake/Output from previous day: 04/25 0701 - 04/26 0700 In: 600 [P.O.:600] Out: 600 [Urine:600] Intake/Output this shift:    PE: Gen: Alert, NAD, pleasant Abd: Soft, tender in the RLQ and LLQ and suprapubic area, ND, +BS, no HSM, well healed scars noted in RLQ (appy), umbilicus (GB)   Lab Results:   Recent Labs  10/02/14 0733 10/04/14 0550  WBC  --  11.6*  HGB 10.2* 9.7*  HCT 31.8* 30.0*  PLT  --  122*   BMET  Recent Labs  10/03/14 0525 10/04/14 0550  NA 134* 135  K 6.0* 3.6  CL 101 102  CO2 26 25  GLUCOSE 109* 109*  BUN 25* 17  CREATININE 0.76 0.56  CALCIUM 8.8 8.2*   PT/INR No results for input(s): LABPROT, INR in the last 72 hours. CMP     Component Value Date/Time   NA 135 10/04/2014 0550   K 3.6 10/04/2014 0550   CL 102 10/04/2014 0550   CO2 25 10/04/2014 0550   GLUCOSE 109* 10/04/2014 0550   BUN 17 10/04/2014 0550   CREATININE 0.56 10/04/2014 0550   CALCIUM 8.2* 10/04/2014 0550   PROT 5.9* 09/30/2014 0600   ALBUMIN 3.0* 09/30/2014 0600   AST 14 09/30/2014 0600   ALT 13 09/30/2014 0600   ALKPHOS 81 09/30/2014 0600   BILITOT 0.4 09/30/2014 0600   GFRNONAA >90 10/04/2014 0550   GFRAA >90 10/04/2014 0550   Lipase     Component Value Date/Time   LIPASE 40 09/30/2014 0253       Studies/Results: No results found.  Anti-infectives: Anti-infectives    None       Assessment/Plan 1. 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 Pathology is pending on these masses. They were not resectable by endoscopy. He may require a subtotal colectomy versus 2 separate segmental resections. Will likely need an ostomy. We'll make this decision which pathology is back. His blood thinner medication will continue to be held.  Clear liquid diet.  Gentle prep with miralax.  Tentative OR tomorrow if path back.  2. Fluid collection in the right abdomen. Suspected to be a small retroperitoneal hematoma from his blood thinner medication. This may be evaluated the time of surgery versus a repeat CT scan in about a week. Currently he remains hemodynamic stable.   3. Gastritis, duodenitis 4. AFIB on Xarelto 5. Normocytic anemia ? Abl anemia Hgb down to 9.7 today 6.  Leukocytosis 11.6 7.  DVT proph - SCD's, blood thinners on hold given bleeding    LOS: 5 days    Coralie Keens 10/04/2014, 8:38 AM Pager: [phone]

## 2014-10-04 NOTE — Progress Notes (Signed)
Pt complained of abdominal "burning pain", this pain has been ongoing as per pt. He refused P.O. Pain medication and requested IV pain meds.

## 2014-10-04 NOTE — Progress Notes (Signed)
Utilization review completed.  

## 2014-10-04 NOTE — Progress Notes (Signed)
Patient ID: [name], male   DOB: 25-May-1946, 69 y.o.   MRN: [id number]   Pathology report shows that rectal mass and cecal mass have no invasive cancer, but significant dysplasia (precancerous).  The other polyps are benign adenomas with no dysplasia.  We will proceed with ileocectomy and low anterior resection tomorrow.  There is a possibility that he might need a temporary diverting colostomy/ ileostomy, but this will be decided during surgery.  NPO p MN Hold anticoagulation.  The surgical procedure has been discussed with the patient.  Potential risks, benefits, alternative treatments, and expected outcomes have been explained.  All of the patient's questions at this time have been answered.  The likelihood of reaching the patient's treatment goal is good.  The patient understand the proposed surgical procedure and wishes to proceed.  Imogene Burn. Georgette Dover, MD, [facility]  General/ Trauma Surgery  10/04/2014 3:46 PM

## 2014-10-04 NOTE — Progress Notes (Signed)
[facility]                                                                              Patient Demographics  [name], is a 69 y.o. male, DOB - 05-14-1946, YKD:[id number]  Admit date - 09/29/2014   Admitting Physician Ivor Costa, MD  Outpatient Primary MD for the patient is [name], MD  LOS - 5   Chief Complaint  Patient presents with  . 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 Patient was recently hospitalized from 4/16 to 4/18 because of GI bleeding. He had EGD on 4/18, which was negative for active bleeding. GI recommended proceeding with a colonoscopy, however since bleeding had resolved, patient refuse colonoscopy. His Xarelto was restarted on 4/19. He started having rectal bleeding again on the day of admission. He had 4 times of rectal bleeding with bright red blood per patient. He denied any lightheadedness, chest pain or shortness breath. Patient reports having severe abdominal pain over suprapubic area. He has mild nausea, but no vomiting or diarrhea.   Assessment & Plan    Principal Problem:   GIB (gastrointestinal bleeding)/ lower GI bleed with cecal and rectal mass newly diagnosed - Recent EGD on 4/18 did not reveal any foci of bleeding - Continue to hold xarelto, perhaps he is not a good candidate for anticoagulation at this point with recurrent GI bleeding  - Continue PPI, patient underwent colonoscopy on 4/23, showed a cecal mass 4x 4 cm, rectal mass 4 x 5 cm, CEA pending - Appreciate general surgery consult and recommendations, NPO after MN - Per GI, both masses are adenoma with focal high-grade dysplasia on the biopsy report  Intraabdominal bleed/retroperitoneal hemorrhage: Likely from xarelto - CT abd pelvis showed 1.8 x 4.6 cm collection over  the posterior right mid abdomen immediately posterior to several small bowel loops and inferior to the right kidney, likely retroperitoneal hematoma - will continue to hold xarelto, likely not a good candidate for anticoagulation at this point with retroperitoneal hemorrhage and GI bleeding and will need further workup regarding cecal, rectal mass  Suprapubic Abdominal pain: unclear etiology, resolved - UA negative for UTI  Chronic atrial fibrillation - CHADS2-VASc Score is 2, patient is on xarelto, perhaps he is not a good candidate for antiplatelet medication at this point with recurrent GI bleeding and retroperitoneal hemorrhage. - Will continue to hold xarelto at discharge and patient will need to discuss with his primary care physician or his cardiologist regarding further decision about anticoagulation - Continue Coreg  Hyperkalemia - Resolved  DEPRESSION: -  Stable. No suicidal or homicidal ideations. - Continue  Zoloft.   Thrombocytopenia: Chronic - Currently close to baseline  BPH: Stable -Continue with doxazosin and finasteride.  Protein-calorie malnutrition:  - Nutrition consult, will place on nutritional supplements  Chronic ischemic heart disease:  - Patient was on both aspirin and anticoagulation before. ASA was discontinued permanently in previous admssion - hold Xarelto -continue  coreg and pravastatin  Hyperlipidemia:  - Continue pravastatin - Lipid panel showed LDL 62, cholesterol 110  Hypertension: Well-controlled -Continue Coreg   Code Status: Full code  Family Communication: Discussed in detail with the patient, all imaging results, lab results explained to the patient    Disposition Plan: further management pending, surgery?  Time Spent in minutes   25 minutes  Procedures  none  Consults   Gastroenterology General surgery  DVT Prophylaxis  SCD's  Medications  Scheduled Meds: . calcium-vitamin D  1 tablet Oral Daily  . carvedilol   12.5 mg Oral BID WC  . diltiazem  120 mg Oral Daily  . doxazosin  4 mg Oral Daily  . finasteride  5 mg Oral Daily  . nicotine  21 mg Transdermal Daily  . pantoprazole  40 mg Oral Q0600  . polyethylene glycol  17 g Oral BID  . pravastatin  10 mg Oral QHS  . sertraline  50 mg Oral q morning - 10a  . sodium chloride  3 mL Intravenous Q12H  . zolpidem  5 mg Oral QHS   Continuous Infusions: . sodium chloride 75 mL/hr at 10/03/14 2015   PRN Meds:.docusate sodium, guaiFENesin-dextromethorphan, HYDROmorphone (DILAUDID) injection, HYDROmorphone (DILAUDID) injection, hydrOXYzine, ondansetron (ZOFRAN) IV, traMADol   Antibiotics   Anti-infectives    None        Subjective:   [name] was seen and examined today.  reports no nausea or vomiting, tolerating solid diet, no fevers or chills, productive cough.  Patient denies dizziness, chest pain, shortness of breath, N/V/D/C, new weakness, numbess, tingling.   Objective:   Blood pressure 106/67, pulse 77, temperature 99 F (37.2 C), temperature source Oral, resp. rate 18, height 5\' 9"  (1.753 m), weight 46.4 kg (102 lb 4.7 oz), SpO2 96 %.  Wt Readings from Last 3 Encounters:  09/30/14 46.4 kg (102 lb 4.7 oz)  05/31/13 50.077 kg (110 lb 6.4 oz)  12/24/06 42.661 kg (94 lb 0.8 oz)     Intake/Output Summary (Last 24 hours) at 10/04/14 1256 Last data filed at 10/04/14 0855  Gross per 24 hour  Intake    480 ml  Output   1800 ml  Net  -1320 ml    Exam  General: Alert and oriented x 3, NAD  HEENT:  PERRLA, EOMI, Anicteic Sclera  Neck: Supple, no JVD  CVS: S1 S2 clear no murmur rubs or gallops  Respiratory: Clear to auscultation bilaterally  Abdomen: Soft, NT, ND, NBS  Ext: no cyanosis clubbing or edema  Neuro: AAOx3, Cr N's II- XII. Strength 5/5 upper and lower extremities bilaterally  Skin: No rashes  Psych: Normal affect and demeanor, alert and oriented x3    Data Review   Micro Results Recent Results (from  the past 240 hour(s))  MRSA PCR Screening     Status: None   Collection Time: 09/30/14  1:10 AM  Result Value Ref Range Status   MRSA by PCR NEGATIVE NEGATIVE Final    Comment:        The GeneXpert MRSA Assay (FDA approved for NASAL specimens only), is one component of a comprehensive MRSA colonization surveillance program. It is not intended to diagnose MRSA infection nor to guide or monitor treatment for MRSA infections.     Radiology Reports Dg Chest 2 View  09/21/2014   CLINICAL DATA:  Left-sided chest pain for 4 days.  EXAM: CHEST  2 VIEW  COMPARISON:  05/27/2013  FINDINGS: Unchanged positioning of dual-chamber pacer leads  from the right. Negative cardiomediastinal silhouette. Streaky right infrahilar opacity. There is no edema, consolidation, effusion, or pneumothorax.  IMPRESSION: 1. No acute findings. 2. Mild scarring or atelectasis in the right infrahilar lung.   Electronically Signed   By: Monte Fantasia M.D.   On: 09/21/2014 22:29   Ct Abdomen Pelvis W Contrast  09/30/2014   CLINICAL DATA:  Patient presents with rectal bleeding. Recent hospitalization for rectal bleeding. EGD 09/26/2014 negative. Abdominal pain and suprapubic pain. Mild nausea.  EXAM: CT ABDOMEN AND PELVIS WITH CONTRAST  TECHNIQUE: Multidetector CT imaging of the abdomen and pelvis was performed using the standard protocol following bolus administration of intravenous contrast.  CONTRAST:  188mL OMNIPAQUE IOHEXOL 300 MG/ML  SOLN  COMPARISON:  10/26/2006  FINDINGS: Lung bases are within normal.  Cardiac pacer leads are present.  Abdominal images demonstrate evidence of previous cholecystectomy. There is dilatation of the common bile duct measuring 1.7 cm with mild prominence of the central intrahepatic ducts likely due to the postcholecystectomy state. No focal mass or stone seen within the common bile duct. 4 mm hypodensity over the body of the pancreas unchanged. The spleen, liver and adrenal glands are within  normal. Kidneys are normal size without hydronephrosis or nephrolithiasis. There is a 1.5 cm cyst over the upper pole of the left kidney and 1.2 cm cyst over the upper pole of the right kidney. Ureters are within normal. Appendix is not visualized. There is moderate calcified plaque over the abdominal aorta and iliac arteries. 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 There is minimal diverticulosis of the colon.  There is mild reversal of the a normal relationship of the SMA and SMV as the SMV is slightly anterior and to the left of the SMA, although this unchanged. Slight increased number of small bowel loops in the right upper quadrant although unchanged. These findings can be seen with an internal hernia. No evidence of bowel obstruction. There is a heterogeneous extraluminal collection immediately posterior to several small bowel loops in the right mid abdomen and located just inferior to the right kidney measuring 1.8 x 4.6 cm. This may represent a hemorrhagic collection. There is no evidence of bowel perforation/free air.  Pelvic images demonstrate a normal bladder, prostate and rectum. There is hardware over the right femur intact. Mild degenerate change of the spine and left hip.  IMPRESSION: Heterogeneous collection measuring 1.8 x 4.6 cm over the posterior right mid abdomen immediately posterior to several small bowel loops and inferior to the right kidney. This likely represents a small hemorrhagic collection with etiology uncertain. No evidence of free peritoneal air or bowel obstruction.  Stable reversal of the SMA to SMV relationship with slight increased number of small bowel loops in the right upper quadrant as may be due to internal hernia, however is unchanged from the prior exam.  Post cholecystectomy prominence of the common bowel duct. Stable 4 mm hypodensity over the body of the pancreas unchanged from 2008.  Bilateral renal cysts.  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  These results were called by telephone at the time of interpretation on 09/30/2014 at 1:32 pm to Dr. Tana Coast, who verbally acknowledged these results.   Electronically Signed   By: Marin Olp M.D.   On: 09/30/2014 13:33   Dg Hand 2 View Right  09/25/2014   CLINICAL DATA:  Right hand pain. Slammed door on fingers at  home last Sunday, persistent pain.  EXAM: RIGHT HAND - 2 VIEW  COMPARISON:  None.  FINDINGS: Diffuse bony under mineralization. No fracture or dislocation. The alignment is maintained. 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 There are multiple, at least 4, small linear densities projecting in the soft tissues of the hand overlying the metacarpals consistent with foreign bodies. These measure up to 7 mm in length.  IMPRESSION: 1. No fracture or dislocation of the right hand. 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 2. Multiple, at least 4, small linear densities in the soft tissues of the hand consistent with foreign bodies. Acuity is uncertain.   Electronically Signed   By: Jeb Levering M.D.   On: 09/25/2014 01:36    CBC  Recent Labs Lab 09/29/14 2211 09/30/14 0253 09/30/14 0600  09/30/14 1807 10/01/14 0900 10/01/14 1850 10/02/14 0733 10/04/14 0550  WBC 6.2 6.2 6.0  --   --   --   --   --  11.6*  HGB 9.1* 10.1* 9.8*  < > 9.7* 9.6* 9.2* 10.2* 9.7*  HCT 27.9* 31.3* 30.8*  < > 30.1* 30.1* 28.7* 31.8* 30.0*  PLT 126* 149* 146*  --   --   --   --   --  122*  MCV 98.9 99.1 98.1  --   --   --   --   --  97.4  MCH 32.3 32.0 31.2  --   --   --   --   --  31.5  MCHC 32.6 32.3 31.8  --   --   --   --   --  32.3  RDW 13.8 13.5 13.9  --   --   --   --   --  13.7  LYMPHSABS 2.5  --   --   --   --   --   --   --   --   MONOABS 0.5  --   --   --   --   --   --   --   --   EOSABS 0.2  --   --   --   --   --   --   --   --   BASOSABS 0.0  --    --   --   --   --   --   --   --   < > = values in this interval not displayed.  Chemistries   Recent Labs Lab 09/29/14 2211 09/30/14 0600 10/01/14 0448 10/02/14 0437 10/03/14 0525 10/04/14 0550  NA 137 138 141 138 134* 135  K 4.4 4.3 4.4 4.5 6.0* 3.6  CL 104 107 110 104 101 102  CO2 25 24 22 27 26 25   GLUCOSE 115* 110* 125* 134* 109* 109*  BUN 25* 19 10 12  25* 17  CREATININE 0.69 0.70 0.71 0.73 0.76 0.56  CALCIUM 8.8 8.6 8.7 8.8 8.8 8.2*  AST 16 14  --   --   --   --   ALT 12 13  --   --   --   --   ALKPHOS 83 81  --   --   --   --   BILITOT 0.3 0.4  --   --   --   --    ------------------------------------------------------------------------------------------------------------------ estimated creatinine clearance is 58.8 mL/min (by C-G formula based on Cr of 0.56). ------------------------------------------------------------------------------------------------------------------ No results for input(s): HGBA1C in  the last 72 hours. ------------------------------------------------------------------------------------------------------------------ No results for input(s): CHOL, HDL, LDLCALC, TRIG, CHOLHDL, LDLDIRECT in the last 72 hours. ------------------------------------------------------------------------------------------------------------------ No results for input(s): TSH, T4TOTAL, T3FREE, THYROIDAB in the last 72 hours.  Invalid input(s): FREET3 ------------------------------------------------------------------------------------------------------------------ No results for input(s): VITAMINB12, FOLATE, FERRITIN, TIBC, IRON, RETICCTPCT in the last 72 hours.  Coagulation profile  Recent Labs Lab 09/29/14 2211  INR 1.79*    No results for input(s): DDIMER in the last 72 hours.  Cardiac Enzymes No results for input(s): CKMB, TROPONINI, MYOGLOBIN in the last 168 hours.  Invalid input(s):  CK ------------------------------------------------------------------------------------------------------------------ Invalid input(s): [place]  10/02/14 0545 10/03/14 0534 10/04/14 0636  GLUCAP 136* 102* 27*     RAI,RIPUDEEP M.D. [facility] 10/04/2014, 12:56 PM  Pager: [phone]   Between 7am to 7pm - call Pager - [phone]  After 7pm go to [email or web address] - password [email or web address]  Call night coverage person covering after 7pm

## 2014-10-04 NOTE — Progress Notes (Signed)
Pathology called me this morning with results  1. Cecal mass -- adenoma with at least focal high grade dysplasia  2.  Rectal mass -- adenoma with at least focal high grade dysplasia  Remains polyps removed -- tubular adenoma withOUT high grade dysplasia.

## 2014-10-05 ENCOUNTER — Inpatient Hospital Stay (HOSPITAL_COMMUNITY): Payer: Medicare Other | Admitting: Certified Registered"

## 2014-10-05 ENCOUNTER — Encounter (HOSPITAL_COMMUNITY): Payer: Self-pay | Admitting: Certified Registered Nurse Anesthetist

## 2014-10-05 ENCOUNTER — Encounter (HOSPITAL_COMMUNITY)
Admission: EM | Disposition: A | Discharge status: Nursing Home (Includes ICF) | Payer: Medicare Other | Source: Home / Self Care | Attending: Internal Medicine

## 2014-10-05 DIAGNOSIS — N402 Nodular prostate without lower urinary tract symptoms: Secondary | ICD-10-CM

## 2014-10-05 HISTORY — PX: PARTIAL COLECTOMY: SHX5273

## 2014-10-05 LAB — CBC
HCT: 26.1 % — ABNORMAL LOW (ref 39.0–52.0)
HCT: 28.4 % — ABNORMAL LOW (ref 39.0–52.0)
HEMOGLOBIN: 9.1 g/dL — AB (ref 13.0–17.0)
Hemoglobin: 8.5 g/dL — ABNORMAL LOW (ref 13.0–17.0)
MCH: 31.5 pg (ref 26.0–34.0)
MCH: 31.1 pg (ref 26.0–34.0)
MCHC: 32.6 g/dL (ref 30.0–36.0)
MCHC: 32 g/dL (ref 30.0–36.0)
MCV: 96.7 fL (ref 78.0–100.0)
MCV: 96.9 fL (ref 78.0–100.0)
PLATELETS: 107 10*3/uL — AB (ref 150–400)
Platelets: 110 10*3/uL — ABNORMAL LOW (ref 150–400)
RBC: 2.7 MIL/uL — ABNORMAL LOW (ref 4.22–5.81)
RBC: 2.93 MIL/uL — ABNORMAL LOW (ref 4.22–5.81)
RDW: 13.6 % (ref 11.5–15.5)
RDW: 13.4 % (ref 11.5–15.5)
WBC: 9.8 10*3/uL (ref 4.0–10.5)
WBC: 8.5 10*3/uL (ref 4.0–10.5)

## 2014-10-05 LAB — CREATININE, SERUM
Creatinine, Ser: 0.74 mg/dL (ref 0.50–1.35)
GFR calc Af Amer: >90 mL/min (ref >90)
GFR calc non Af Amer: >90 mL/min (ref >90)

## 2014-10-05 LAB — GLUCOSE, CAPILLARY
Glucose-Capillary: 116 mg/dL — ABNORMAL HIGH (ref 70–99)
Glucose-Capillary: 109 mg/dL — ABNORMAL HIGH (ref 70–99)

## 2014-10-05 LAB — BASIC METABOLIC PANEL
ANION GAP: 7 (ref 5–15)
BUN: 16 mg/dL (ref 6–23)
CHLORIDE: 100 mmol/L (ref 96–112)
CO2: 25 mmol/L (ref 19–32)
CREATININE: 0.52 mg/dL (ref 0.50–1.35)
Calcium: 8.2 mg/dL — ABNORMAL LOW (ref 8.4–10.5)
GFR calc Af Amer: >90 mL/min (ref >90)
GFR calc non Af Amer: >90 mL/min (ref >90)
Glucose, Bld: 101 mg/dL — ABNORMAL HIGH (ref 70–99)
Potassium: 3.8 mmol/L (ref 3.5–5.1)
Sodium: 132 mmol/L — ABNORMAL LOW (ref 135–145)

## 2014-10-05 LAB — TYPE AND SCREEN
ABO/RH(D): O POS
ANTIBODY SCREEN: NEGATIVE

## 2014-10-05 SURGERY — COLECTOMY, PARTIAL
Anesthesia: General | Site: Abdomen

## 2014-10-05 MED ORDER — PROPOFOL 10 MG/ML IV BOLUS
INTRAVENOUS | Status: AC
  Filled 2014-10-05: qty 20

## 2014-10-05 MED ORDER — MIDAZOLAM HCL 5 MG/5ML IJ SOLN
INTRAMUSCULAR | Status: DC | PRN
  Administered 2014-10-05: 1 mg via INTRAVENOUS

## 2014-10-05 MED ORDER — FENTANYL CITRATE (PF) 250 MCG/5ML IJ SOLN
INTRAMUSCULAR | Status: AC
Start: 2014-10-05 — End: 2014-10-05
  Filled 2014-10-05: qty 5

## 2014-10-05 MED ORDER — ONDANSETRON HCL 4 MG/2ML IJ SOLN: 4.0000 mg | Freq: Four times a day (QID) | INTRAMUSCULAR | Status: DC | PRN

## 2014-10-05 MED ORDER — ONDANSETRON HCL 4 MG/2ML IJ SOLN
INTRAMUSCULAR | Status: DC | PRN
  Administered 2014-10-05: 4 mg via INTRAVENOUS

## 2014-10-05 MED ORDER — FENTANYL CITRATE (PF) 100 MCG/2ML IJ SOLN
INTRAMUSCULAR | Status: AC
  Filled 2014-10-05: qty 2

## 2014-10-05 MED ORDER — ROCURONIUM BROMIDE 50 MG/5ML IV SOLN
INTRAVENOUS | Status: AC
  Filled 2014-10-05: qty 1

## 2014-10-05 MED ORDER — HYDROMORPHONE 0.3 MG/ML IV SOLN
INTRAVENOUS | Status: DC
  Administered 2014-10-05: 0.3 mg via INTRAVENOUS
  Administered 2014-10-05: 0.9 mg via INTRAVENOUS
  Administered 2014-10-06: 0.3 mg via INTRAVENOUS
  Administered 2014-10-06: 1.8 mg via INTRAVENOUS
  Administered 2014-10-06: 2.7 mg via INTRAVENOUS
  Filled 2014-10-05: qty 25

## 2014-10-05 MED ORDER — MEPERIDINE HCL 25 MG/ML IJ SOLN: 6.2500 mg | INTRAMUSCULAR | Status: DC | PRN

## 2014-10-05 MED ORDER — CEFOTETAN DISODIUM-DEXTROSE 2-2.08 GM-% IV SOLR
INTRAVENOUS | Status: AC
  Filled 2014-10-05: qty 50

## 2014-10-05 MED ORDER — HEPARIN SODIUM (PORCINE) 5000 UNIT/ML IJ SOLN
5000.0000 [IU] | Freq: Three times a day (TID) | INTRAMUSCULAR | Status: DC
  Filled 2014-10-05 (×3): qty 1

## 2014-10-05 MED ORDER — ROCURONIUM BROMIDE 100 MG/10ML IV SOLN
INTRAVENOUS | Status: DC | PRN
  Administered 2014-10-05: 10 mg via INTRAVENOUS
  Administered 2014-10-05: 40 mg via INTRAVENOUS

## 2014-10-05 MED ORDER — ARTIFICIAL TEARS OP OINT
TOPICAL_OINTMENT | OPHTHALMIC | Status: AC
  Filled 2014-10-05: qty 3.5

## 2014-10-05 MED ORDER — DIPHENHYDRAMINE HCL 50 MG/ML IJ SOLN: 12.5000 mg | Freq: Four times a day (QID) | INTRAMUSCULAR | Status: DC | PRN

## 2014-10-05 MED ORDER — LACTATED RINGERS IV SOLN
INTRAVENOUS | Status: DC
  Administered 2014-10-05 (×3): via INTRAVENOUS

## 2014-10-05 MED ORDER — GLYCOPYRROLATE 0.2 MG/ML IJ SOLN
INTRAMUSCULAR | Status: DC | PRN
  Administered 2014-10-05: 0.4 mg via INTRAVENOUS

## 2014-10-05 MED ORDER — MIDAZOLAM HCL 2 MG/2ML IJ SOLN
INTRAMUSCULAR | Status: AC
  Filled 2014-10-05: qty 2

## 2014-10-05 MED ORDER — LIDOCAINE HCL (CARDIAC) 20 MG/ML IV SOLN
INTRAVENOUS | Status: AC
  Filled 2014-10-05: qty 10

## 2014-10-05 MED ORDER — 0.9 % SODIUM CHLORIDE (POUR BTL) OPTIME
TOPICAL | Status: DC | PRN
  Administered 2014-10-05 (×4): 1000 mL

## 2014-10-05 MED ORDER — NALOXONE HCL 0.4 MG/ML IJ SOLN: 0.4000 mg | INTRAMUSCULAR | Status: DC | PRN

## 2014-10-05 MED ORDER — LIDOCAINE HCL (CARDIAC) 20 MG/ML IV SOLN
INTRAVENOUS | Status: DC | PRN
  Administered 2014-10-05: 40 mg via INTRAVENOUS

## 2014-10-05 MED ORDER — FENTANYL CITRATE (PF) 100 MCG/2ML IJ SOLN
INTRAMUSCULAR | Status: DC | PRN
  Administered 2014-10-05: 250 ug via INTRAVENOUS

## 2014-10-05 MED ORDER — PROMETHAZINE HCL 25 MG/ML IJ SOLN: 6.2500 mg | INTRAMUSCULAR | Status: DC | PRN

## 2014-10-05 MED ORDER — SODIUM CHLORIDE 0.9 % IJ SOLN: 9.0000 mL | INTRAMUSCULAR | Status: DC | PRN

## 2014-10-05 MED ORDER — MIDAZOLAM HCL 2 MG/2ML IJ SOLN
0.5000 mg | Freq: Once | INTRAMUSCULAR | Status: DC | PRN
  Administered 2014-10-06: 2 mg via INTRAVENOUS

## 2014-10-05 MED ORDER — DIPHENHYDRAMINE HCL 12.5 MG/5ML PO ELIX: 12.5000 mg | ORAL_SOLUTION | Freq: Four times a day (QID) | ORAL | Status: DC | PRN

## 2014-10-05 MED ORDER — NEOSTIGMINE METHYLSULFATE 10 MG/10ML IV SOLN
INTRAVENOUS | Status: DC | PRN
  Administered 2014-10-05: 3 mg via INTRAVENOUS

## 2014-10-05 MED ORDER — HYDROMORPHONE 0.3 MG/ML IV SOLN
INTRAVENOUS | Status: AC
  Filled 2014-10-05: qty 25

## 2014-10-05 MED ORDER — ONDANSETRON HCL 4 MG/2ML IJ SOLN
INTRAMUSCULAR | Status: AC
  Filled 2014-10-05: qty 2

## 2014-10-05 MED ORDER — PROPOFOL 10 MG/ML IV BOLUS
INTRAVENOUS | Status: DC | PRN
  Administered 2014-10-05: 100 mg via INTRAVENOUS

## 2014-10-05 MED ORDER — FENTANYL CITRATE (PF) 100 MCG/2ML IJ SOLN
25.0000 ug | INTRAMUSCULAR | Status: DC | PRN
  Administered 2014-10-05 (×4): 25 ug via INTRAVENOUS
  Administered 2014-10-06: 50 ug via INTRAVENOUS

## 2014-10-05 MED ORDER — VECURONIUM BROMIDE 10 MG IV SOLR
INTRAVENOUS | Status: DC | PRN
  Administered 2014-10-05: 2 mg via INTRAVENOUS
  Administered 2014-10-05: 1 mg via INTRAVENOUS

## 2014-10-05 SURGICAL SUPPLY — 55 items
BLADE SURG ROTATE 9660 (MISCELLANEOUS) ×2 IMPLANT
CANISTER SUCTION 2500CC (MISCELLANEOUS) ×3 IMPLANT
CHLORAPREP W/TINT 26ML (MISCELLANEOUS) ×3 IMPLANT
COVER MAYO STAND STRL (DRAPES) ×6 IMPLANT
COVER SURGICAL LIGHT HANDLE (MISCELLANEOUS) ×3 IMPLANT
DRAPE LAPAROSCOPIC ABDOMINAL (DRAPES) ×3 IMPLANT
DRAPE PROXIMA HALF (DRAPES) ×6 IMPLANT
DRAPE UTILITY XL STRL (DRAPES) ×9 IMPLANT
DRAPE WARM FLUID 44X44 (DRAPE) ×3 IMPLANT
DRSG COVADERM 4X10 (GAUZE/BANDAGES/DRESSINGS) ×2 IMPLANT
ELECT BLADE 6.5 EXT (BLADE) ×3 IMPLANT
ELECT CAUTERY BLADE 6.4 (BLADE) ×6 IMPLANT
ELECT REM PT RETURN 9FT ADLT (ELECTROSURGICAL) ×3
ELECTRODE REM PT RTRN 9FT ADLT (ELECTROSURGICAL) ×1 IMPLANT
GAUZE SPONGE 2X2 8PLY STRL LF (GAUZE/BANDAGES/DRESSINGS) IMPLANT
GLOVE BIO SURGEON STRL SZ 6 (GLOVE) ×4 IMPLANT
GLOVE BIO SURGEON STRL SZ7 (GLOVE) ×18 IMPLANT
GLOVE BIOGEL PI IND STRL 6.5 (GLOVE) IMPLANT
GLOVE BIOGEL PI IND STRL 7.0 (GLOVE) IMPLANT
GLOVE BIOGEL PI IND STRL 7.5 (GLOVE) ×2 IMPLANT
GLOVE BIOGEL PI INDICATOR 6.5 (GLOVE) ×2
GLOVE BIOGEL PI INDICATOR 7.0 (GLOVE) ×12
GLOVE BIOGEL PI INDICATOR 7.5 (GLOVE) ×4
GOWN STRL REUS W/ TWL LRG LVL3 (GOWN DISPOSABLE) ×6 IMPLANT
GOWN STRL REUS W/TWL LRG LVL3 (GOWN DISPOSABLE) ×21
KIT BASIN OR (CUSTOM PROCEDURE TRAY) ×3 IMPLANT
KIT ROOM TURNOVER OR (KITS) ×3 IMPLANT
LEGGING LITHOTOMY PAIR STRL (DRAPES) ×2 IMPLANT
LIGASURE IMPACT 36 18CM CVD LR (INSTRUMENTS) ×3 IMPLANT
NS IRRIG 1000ML POUR BTL (IV SOLUTION) ×6 IMPLANT
PACK GENERAL/GYN (CUSTOM PROCEDURE TRAY) ×3 IMPLANT
PAD ARMBOARD 7.5X6 YLW CONV (MISCELLANEOUS) ×4 IMPLANT
PENCIL BUTTON HOLSTER BLD 10FT (ELECTRODE) ×3 IMPLANT
RELOAD PROXIMATE 75MM BLUE (ENDOMECHANICALS) ×3 IMPLANT
RELOAD STAPLE 75 3.8 BLU REG (ENDOMECHANICALS) IMPLANT
SPONGE GAUZE 2X2 STER 10/PKG (GAUZE/BANDAGES/DRESSINGS) ×2
SPONGE LAP 18X18 X RAY DECT (DISPOSABLE) IMPLANT
STAPLER CIRC ILS CVD 25MM (STAPLE) ×2 IMPLANT
STAPLER CUT CVD 40MM BLUE (STAPLE) ×2 IMPLANT
STAPLER PROXIMATE 75MM BLUE (STAPLE) ×2 IMPLANT
STAPLER VISISTAT 35W (STAPLE) ×5 IMPLANT
SUCTION POOLE TIP (SUCTIONS) ×3 IMPLANT
SURGILUBE 2OZ TUBE FLIPTOP (MISCELLANEOUS) IMPLANT
SUT PDS AB 1 TP1 96 (SUTURE) ×6 IMPLANT
SUT SILK 2 0 SH CR/8 (SUTURE) ×3 IMPLANT
SUT SILK 2 0 TIES 10X30 (SUTURE) ×3 IMPLANT
SUT SILK 3 0 SH CR/8 (SUTURE) ×3 IMPLANT
SUT SILK 3 0 TIES 10X30 (SUTURE) ×3 IMPLANT
SYR BULB IRRIGATION 50ML (SYRINGE) ×3 IMPLANT
TOWEL OR 17X26 10 PK STRL BLUE (TOWEL DISPOSABLE) ×6 IMPLANT
TRAY FOLEY CATH SILVER 16FR (SET/KITS/TRAYS/PACK) ×2 IMPLANT
TRAY PROCTOSCOPIC FIBER OPTIC (SET/KITS/TRAYS/PACK) ×2 IMPLANT
TUBE CONNECTING 12'X1/4 (SUCTIONS) ×1
TUBE CONNECTING 12X1/4 (SUCTIONS) ×2 IMPLANT
YANKAUER SUCT BULB TIP NO VENT (SUCTIONS) ×3 IMPLANT

## 2014-10-05 NOTE — Anesthesia Preprocedure Evaluation (Addendum)
Anesthesia Evaluation  Patient identified by MRN, date of birth, ID band Patient awake    Reviewed: Allergy & Precautions, NPO status , Patient's Chart, lab work & pertinent test results  History of Anesthesia Complications Negative for: history of anesthetic complications  Airway Mallampati: II  TM Distance: >3 FB Neck ROM: Full    Dental  (+) Edentulous Upper, Edentulous Lower   Pulmonary COPDCurrent Smoker, former smoker,  breath sounds clear to auscultation        Cardiovascular hypertension, Pt. on medications and Pt. on home beta blockers - angina+ Peripheral Vascular Disease + dysrhythmias Atrial Fibrillation + pacemaker (for 3 AVB) Rhythm:Irregular Rate:Normal  '08 ECHO: EF 55-65%, valves OK   Neuro/Psych Depression negative neurological ROS     GI/Hepatic N/V with cancer H/o GI bleed   Endo/Other    Renal/GU      Musculoskeletal   Abdominal   Peds  Hematology  (+) Blood dyscrasia (xarelto, HB 8.5), ,   Anesthesia Other Findings   Reproductive/Obstetrics                          Anesthesia Physical Anesthesia Plan  ASA: III  Anesthesia Plan: General   Post-op Pain Management:    Induction: Intravenous  Airway Management Planned: Oral ETT  Additional Equipment:   Intra-op Plan:   Post-operative Plan: Extubation in OR  Informed Consent: I have reviewed the patients History and Physical, chart, labs and discussed the procedure including the risks, benefits and alternatives for the proposed anesthesia with the patient or authorized representative who has indicated his/her understanding and acceptance.     Plan Discussed with: CRNA and Surgeon  Anesthesia Plan Comments: (Plan routine monitors, GETA)        Anesthesia Quick Evaluation

## 2014-10-05 NOTE — Progress Notes (Signed)
4 Days Post-Op  Subjective: Patient awake, alert We discussed his upcoming surgery.  He has no family or friends to speak with.  Objective: Vital signs in last 24 hours: Temp:  [98.3 F (36.8 C)-99 F (37.2 C)] 99 F (37.2 C) (04/27 0502) Pulse Rate:  [72-86] 86 (04/27 0502) Resp:  [18] 18 (04/27 0502) BP: (99-110)/(54-72) 99/60 mmHg (04/27 0502) SpO2:  [92 %-96 %] 93 % (04/27 0502) Last BM Date: 10/04/14  Intake/Output from previous day: 04/26 0701 - 04/27 0700 In: 360 [P.O.:360] Out: 1700 [Urine:500; Stool:1200] Intake/Output this shift:    Still with some suprapubic tenderness.  Lab Results:   Recent Labs  10/04/14 0550 10/05/14 0439  WBC 11.6* 9.8  HGB 9.7* 8.5*  HCT 30.0* 26.1*  PLT 122* 110*   BMET  Recent Labs  10/04/14 0550 10/05/14 0439  NA 135 132*  K 3.6 3.8  CL 102 100  CO2 25 25  GLUCOSE 109* 101*  BUN 17 16  CREATININE 0.56 0.52  CALCIUM 8.2* 8.2*   PT/INR No results for input(s): LABPROT, INR in the last 72 hours. ABG No results for input(s): PHART, HCO3 in the last 72 hours.  Invalid input(s): PCO2, PO2  Studies/Results: No results found.  Anti-infectives: Anti-infectives    Start     Dose/Rate Route Frequency Ordered Stop   10/05/14 1045  cefoTEtan (CEFOTAN) 2 g in dextrose 5 % 50 mL IVPB     2 g 100 mL/hr over 30 Minutes Intravenous 30 min pre-op 10/04/14 1548        Assessment/Plan: s/p Procedure(s): COLONOSCOPY (N/A) Type and screen  Plan right hemicolectomy/ low anterior resection/ possible colostomy today.  The surgical procedure has been discussed with the patient.  Potential risks, benefits, alternative treatments, and expected outcomes have been explained.  All of the patient's questions at this time have been answered.  The likelihood of reaching the patient's treatment goal is good.  The patient understand the proposed surgical procedure and wishes to proceed.   LOS: 6 days    Raena Pau K. 10/05/2014

## 2014-10-05 NOTE — Op Note (Signed)
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 Asst.: Dr. Stark Klein Anesthesia: Gen. endotracheal Indications:  This is a 69 year old male who presented with anemia and some melena. His workup included a colonoscopy showing a large mass in his cecum as well as a large mass in the rectum. He has 2 other polyps that were removed.  None of the biopsies showed invasive adenocarcinoma but he does have high-grade dysplasia of the mass in the rectum as well as the cecum. The patient has no family or friend support and has a limited understanding of the care that might be needed for long-term surveillance. We discussed the possibilities for surgery including to separate segmental resections, possible subtotal colectomy, and possible colostomy.  Description of procedure: The patient was brought to the operating room and placed in a supine position on the operating room table. After an adequate level of general anesthesia was obtained, a Foley catheter was placed under sterile technique. The patient's legs were placed in lithotomy position in yellowfin stirrups. His perineum was prepped with Betadine. His abdomen was prepped with ChloraPrep and draped in sterile fashion. A timeout was taken to ensure the proper patient and proper procedure. A vertical midline incision was made from the symphysis pubis to the epigastrium. Dissection was carried down to the fascia which was opened at the linea alba. We into the peritoneal cavity sharply. Patient has some mild ascites. We placed fixed retractors. The patient has an unusual appearance to the anatomy. The cecum seems to be located in the right lower quadrant and the ascending transverse and descending colon appear to be in normal position. However small bowel seemed  to be all located on the right side of the abdomen seems to be covered by a thin transparent sac.  This sac was opened and the small bowel appeared to be normal. 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 The cecum was mobilized from the lateral attachments and mobilized medially. There are some adhesions of the terminal ileum although the patient has never had a previous surgery. The a sending colon and the hepatic flexure were mobilized medially. The duodenum was preserved posteriorly. The mass was easily palpated within the cecum. We then turned our attention to the rectal mass before determining our exact surgical plan. The patient's entire descending colon and sigmoid colon as well as the upper rectum all seemed full of hard balls of stool despite the fact the patient underwent a colonoscopy bowel prep and has been on clear liquids. A hard mass is palpated in the distal sigmoid colon at its juncture with the rectum. There is no stool palpated distal to that mass. The rectum and distal sigmoid colon were mobilized from the attachments on each side of the pelvis. The colon was divided with a blue contour stapler about 5 cm distal to the palpable mass. The mesocolon was divided with the LigaSure device. We then mobilized the descending colon medially up to the splenic flexure. The splenic flexure was taken down with cautery as well as blunt dissection. We divided the gastrocolic ligament with the LigaSure device. The entire colon was thus mobilized. We divided the terminal ileum with a GIA-75 stapler. The ileocolic vessel was ligated with  a 2-0 silk suture. The middle colic vessel was ligated with a 2-0 silk suture. The remainder of the mesentery of the colon was divided with the LigaSure device. The specimen was passed off the field and sent for pathologic examination. We  irrigated the abdomen thoroughly and inspected for hemostasis. With the division of the ileocolic vessel there did not seem to be any further sequela of the partial malrotation. We then created a end-to-side anastomosis between the terminal ileum and the rectum. We used EEA 25 mm stapler. The staple line was indicated on the terminal ileum and a 25 mm anvil was introduced into the small bowel lumen. We closed the end of the small bowel with another GIA-75 stapler and brought the shaft of the anvil out through the antimesenteric border of the terminal ileum. Dr. Barry Dienes inserted the rigid sigmoidoscope and no other masses were identified in the rectum. The  EEA stapler was then inserted and advanced the end of the rectal stump. The spike was advanced just posterior to the staple line and mated with the anvil. The stapler was closed, held for 30 seconds and then fired. The stapler was removed. We occluded the small bowel proximal to the anastomosis and filled the pelvis with saline. The rigid sigmoidoscope was inserted and the anastomosis was inspected and insufflated. There is no sign of leak or bleeding. We then irrigated the entire abdomen thoroughly and inspected for hemostasis. The fascia was reapproximated with double-stranded #1 PDS suture. Subcutaneous tissues were irrigated and staples were used to close the skin.  A dry dressing was applied. He was extubated and brought to recovery was stable condition. All sponge, initially, and needle counts were correct.   Imogene Burn. Georgette Dover, MD, [facility]  General/ Trauma Surgery  10/05/2014 3:27 PM

## 2014-10-05 NOTE — Progress Notes (Signed)
[facility]                                                                              Patient Demographics  [name], is a 69 y.o. male, DOB - 1946/05/26, EVO:[id number]  Admit date - 09/29/2014   Admitting Physician Ivor Costa, MD  Outpatient Primary MD for the patient is [name], MD  LOS - 6   Chief Complaint  Patient presents with  . 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 Patient was recently hospitalized from 4/16 to 4/18 because of GI bleeding. He had EGD on 4/18, which was negative for active bleeding. GI recommended proceeding with a colonoscopy, however since bleeding had resolved, patient refuse colonoscopy. His Xarelto was restarted on 4/19. He started having rectal bleeding again on the day of admission. He had 4 times of rectal bleeding with bright red blood per patient. He denied any lightheadedness, chest pain or shortness breath. Patient reports having severe abdominal pain over suprapubic area. He has mild nausea, but no vomiting or diarrhea.   Assessment & Plan      GIB (gastrointestinal bleeding)/ lower GI bleed with cecal and rectal mass newly diagnosed - Recent EGD on 4/18 did not reveal any foci of bleeding - Continue to hold xarelto, perhaps he is not a good candidate for anticoagulation at this point with recurrent GI bleeding  - Continue PPI, patient underwent colonoscopy on 4/23, showed a cecal mass 4x 4 cm, rectal mass 4 x 5 cm, CEA pending - Appreciate general surgery consult and recommendations, NPO, right hemicolectomy/ low anterior resection/ possible colostomy today - Per GI, both masses are adenoma with focal high-grade dysplasia on the biopsy report   Intraabdominal bleed/retroperitoneal hemorrhage: Likely from xarelto - CT abd  pelvis showed 1.8 x 4.6 cm collection over the posterior right mid abdomen immediately posterior to several small bowel loops and inferior to the right kidney, likely retroperitoneal hematoma - will continue to hold xarelto, likely not a good candidate for anticoagulation at this point with retroperitoneal hemorrhage and GI bleeding and will need further workup regarding cecal, rectal mass  Suprapubic Abdominal pain: unclear etiology, resolved - UA negative for UTI  Chronic atrial fibrillation - CHADS2-VASc Score is 2, patient is on xarelto, perhaps he is not a good candidate for antiplatelet medication at this point with recurrent GI bleeding and retroperitoneal hemorrhage. - Will continue to hold xarelto at discharge and patient will need to discuss with his primary care physician or his cardiologist regarding further decision about anticoagulation - Continue Coreg  Hyperkalemia - Resolved  DEPRESSION: -  Stable. No suicidal or homicidal ideations. - Continue  Zoloft.   Thrombocytopenia: Chronic - Currently close to baseline  BPH: Stable -Continue with doxazosin and finasteride.  Protein-calorie malnutrition:  - Nutrition consult, will place on nutritional supplements  Chronic ischemic heart disease:  - Patient was on both aspirin and anticoagulation before. ASA was discontinued permanently in previous  admssion - hold Xarelto -continue coreg and pravastatin  Hyperlipidemia:  - Continue pravastatin - Lipid panel showed LDL 62, cholesterol 110  Hypertension: Well-controlled -Continue Coreg   Code Status: Full code  Family Communication: Discussed in detail with the patient, all imaging results, lab results explained to the patient    Disposition Plan: further management pending, surgery?  Time Spent in minutes   25 minutes  Procedures  none  Consults   Gastroenterology General surgery  DVT Prophylaxis  SCD's  Medications  Scheduled Meds: . [MAR Hold]  calcium-vitamin D  1 tablet Oral Daily  . [MAR Hold] carvedilol  12.5 mg Oral BID WC  . cefoTEtan (CEFOTAN) IV  2 g Intravenous 30 min Pre-Op  . cefoTEtan in Dextrose 5%      . [MAR Hold] diltiazem  120 mg Oral Daily  . [MAR Hold] doxazosin  4 mg Oral Daily  . [MAR Hold] feeding supplement (RESOURCE BREEZE)  1 Container Oral TID BM  . [MAR Hold] finasteride  5 mg Oral Daily  . [MAR Hold] nicotine  21 mg Transdermal Daily  . [MAR Hold] pantoprazole  40 mg Oral Q0600  . [MAR Hold] polyethylene glycol  17 g Oral BID  . [MAR Hold] pravastatin  10 mg Oral QHS  . [MAR Hold] sertraline  50 mg Oral q morning - 10a  . [MAR Hold] sodium chloride  3 mL Intravenous Q12H  . [MAR Hold] zolpidem  5 mg Oral QHS   Continuous Infusions: . sodium chloride 75 mL/hr at 10/04/14 2235  . lactated ringers 10 mL/hr at 10/05/14 1102   PRN Meds:.[MAR Hold] docusate sodium, [MAR Hold] guaiFENesin-dextromethorphan, [MAR Hold]  HYDROmorphone (DILAUDID) injection, [MAR Hold]  HYDROmorphone (DILAUDID) injection, [MAR Hold] hydrOXYzine, [MAR Hold] ondansetron (ZOFRAN) IV, [MAR Hold] traMADol   Antibiotics   Anti-infectives    Start     Dose/Rate Route Frequency Ordered Stop   10/05/14 1117  cefoTEtan in Dextrose 5% (CEFOTAN) 2-2.08 GM-% IVPB    Comments:  Jones, Tomika   : cabinet override      10/05/14 1117 10/05/14 2329   10/05/14 1045  cefoTEtan (CEFOTAN) 2 g in dextrose 5 % 50 mL IVPB     2 g 100 mL/hr over 30 Minutes Intravenous 30 min pre-op 10/04/14 1548          Subjective:   [name] was seen and examined today.  reports no nausea or vomiting, tolerating solid diet, no fevers or chills, productive cough.     Objective:   Blood pressure 99/60, pulse 86, temperature 99 F (37.2 C), temperature source Oral, resp. rate 18, height 5\' 9"  (1.753 m), weight 46.4 kg (102 lb 4.7 oz), SpO2 93 %.  Wt Readings from Last 3 Encounters:  09/30/14 46.4 kg (102 lb 4.7 oz)  05/31/13 50.077 kg (110 lb 6.4  oz)  12/24/06 42.661 kg (94 lb 0.8 oz)     Intake/Output Summary (Last 24 hours) at 10/05/14 1129 Last data filed at 10/05/14 0509  Gross per 24 hour  Intake    240 ml  Output    500 ml  Net   -260 ml    Exam  General: Alert and oriented x 3, NAD  HEENT:  PERRLA, EOMI, Anicteic Sclera  Neck: Supple, no JVD  CVS: S1 S2 clear no murmur rubs or gallops  Respiratory: Clear to auscultation bilaterally  Abdomen: Soft, NT, ND, NBS  Ext: no cyanosis clubbing or edema  Neuro: AAOx3, Cr N's II- XII.  Strength 5/5 upper and lower extremities bilaterally  Skin: No rashes  Psych: Normal affect and demeanor, alert and oriented x3    Data Review   Micro Results Recent Results (from the past 240 hour(s))  MRSA PCR Screening     Status: None   Collection Time: 09/30/14  1:10 AM  Result Value Ref Range Status   MRSA by PCR NEGATIVE NEGATIVE Final    Comment:        The GeneXpert MRSA Assay (FDA approved for NASAL specimens only), is one component of a comprehensive MRSA colonization surveillance program. It is not intended to diagnose MRSA infection nor to guide or monitor treatment for MRSA infections.     Radiology Reports Dg Chest 2 View  09/21/2014   CLINICAL DATA:  Left-sided chest pain for 4 days.  EXAM: CHEST  2 VIEW  COMPARISON:  05/27/2013  FINDINGS: Unchanged positioning of dual-chamber pacer leads from the right. Negative cardiomediastinal silhouette. Streaky right infrahilar opacity. There is no edema, consolidation, effusion, or pneumothorax.  IMPRESSION: 1. No acute findings. 2. Mild scarring or atelectasis in the right infrahilar lung.   Electronically Signed   By: Monte Fantasia M.D.   On: 09/21/2014 22:29   Ct Abdomen Pelvis W Contrast  09/30/2014   CLINICAL DATA:  Patient presents with rectal bleeding. Recent hospitalization for rectal bleeding. EGD 09/26/2014 negative. Abdominal pain and suprapubic pain. Mild nausea.  EXAM: CT ABDOMEN AND PELVIS WITH  CONTRAST  TECHNIQUE: Multidetector CT imaging of the abdomen and pelvis was performed using the standard protocol following bolus administration of intravenous contrast.  CONTRAST:  123mL OMNIPAQUE IOHEXOL 300 MG/ML  SOLN  COMPARISON:  10/26/2006  FINDINGS: Lung bases are within normal.  Cardiac pacer leads are present.  Abdominal images demonstrate evidence of previous cholecystectomy. There is dilatation of the common bile duct measuring 1.7 cm with mild prominence of the central intrahepatic ducts likely due to the postcholecystectomy state. No focal mass or stone seen within the common bile duct. 4 mm hypodensity over the body of the pancreas unchanged. The spleen, liver and adrenal glands are within normal. Kidneys are normal size without hydronephrosis or nephrolithiasis. There is a 1.5 cm cyst over the upper pole of the left kidney and 1.2 cm cyst over the upper pole of the right kidney. Ureters are within normal. Appendix is not visualized. There is moderate calcified plaque over the abdominal aorta and iliac arteries. 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 There is minimal diverticulosis of the colon.  There is mild reversal of the a normal relationship of the SMA and SMV as the SMV is slightly anterior and to the left of the SMA, although this unchanged. Slight increased number of small bowel loops in the right upper quadrant although unchanged. These findings can be seen with an internal hernia. No evidence of bowel obstruction. There is a heterogeneous extraluminal collection immediately posterior to several small bowel loops in the right mid abdomen and located just inferior to the right kidney measuring 1.8 x 4.6 cm. This may represent a hemorrhagic collection. There is no evidence of bowel perforation/free air.  Pelvic images demonstrate a normal bladder, prostate and rectum. There is hardware over the right femur intact. Mild degenerate  change of the spine and left hip.  IMPRESSION: Heterogeneous collection measuring 1.8 x 4.6 cm over the posterior right mid abdomen immediately posterior to several small bowel loops and inferior to the right kidney. This  likely represents a small hemorrhagic collection with etiology uncertain. No evidence of free peritoneal air or bowel obstruction.  Stable reversal of the SMA to SMV relationship with slight increased number of small bowel loops in the right upper quadrant as may be due to internal hernia, however is unchanged from the prior exam.  Post cholecystectomy prominence of the common bowel duct. Stable 4 mm hypodensity over the body of the pancreas unchanged from 2008.  Bilateral renal cysts.  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  These results were called by telephone at the time of interpretation on 09/30/2014 at 1:32 pm to Dr. Tana Coast, who verbally acknowledged these results.   Electronically Signed   By: Marin Olp M.D.   On: 09/30/2014 13:33   Dg Hand 2 View Right  09/25/2014   CLINICAL DATA:  Right hand pain. Slammed door on fingers at home last Sunday, persistent pain.  EXAM: RIGHT HAND - 2 VIEW  COMPARISON:  None.  FINDINGS: Diffuse bony under mineralization. No fracture or dislocation. The alignment is maintained. 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 There are multiple, at least 4, small linear densities projecting in the soft tissues of the hand overlying the metacarpals consistent with foreign bodies. These measure up to 7 mm in length.  IMPRESSION: 1. No fracture or dislocation of the right hand. 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 2. Multiple, at least 4, small linear densities in the soft tissues of the hand consistent with foreign bodies. Acuity is uncertain.   Electronically Signed   By: Jeb Levering M.D.   On: 09/25/2014 01:36    CBC  Recent Labs Lab  09/29/14 2211 09/30/14 0253 09/30/14 0600  10/01/14 0900 10/01/14 1850 10/02/14 0733 10/04/14 0550 10/05/14 0439  WBC 6.2 6.2 6.0  --   --   --   --  11.6* 9.8  HGB 9.1* 10.1* 9.8*  < > 9.6* 9.2* 10.2* 9.7* 8.5*  HCT 27.9* 31.3* 30.8*  < > 30.1* 28.7* 31.8* 30.0* 26.1*  PLT 126* 149* 146*  --   --   --   --  122* 110*  MCV 98.9 99.1 98.1  --   --   --   --  97.4 96.7  MCH 32.3 32.0 31.2  --   --   --   --  31.5 31.5  MCHC 32.6 32.3 31.8  --   --   --   --  32.3 32.6  RDW 13.8 13.5 13.9  --   --   --   --  13.7 13.6  LYMPHSABS 2.5  --   --   --   --   --   --   --   --   MONOABS 0.5  --   --   --   --   --   --   --   --   EOSABS 0.2  --   --   --   --   --   --   --   --   BASOSABS 0.0  --   --   --   --   --   --   --   --   < > = values in this interval not displayed.  [facility] 09/29/14 2211 09/30/14 0600 10/01/14 0448 10/02/14 0437 10/03/14 0525 10/04/14 0550 10/05/14 0439  NA 137 138  141 138 134* 135 132*  K 4.4 4.3 4.4 4.5 6.0* 3.6 3.8  CL 104 107 110 104 101 102 100  CO2 25 24 22 27 26 25 25   GLUCOSE 115* 110* 125* 134* 109* 109* 101*  BUN 25* 19 10 12  25* 17 16  CREATININE 0.69 0.70 0.71 0.73 0.76 0.56 0.52  CALCIUM 8.8 8.6 8.7 8.8 8.8 8.2* 8.2*  AST 16 14  --   --   --   --   --   ALT 12 13  --   --   --   --   --   ALKPHOS 83 81  --   --   --   --   --   BILITOT 0.3 0.4  --   --   --   --   --    ------------------------------------------------------------------------------------------------------------------ estimated creatinine clearance is 58.8 mL/min (by C-G formula based on Cr of 0.52). ------------------------------------------------------------------------------------------------------------------ No results for input(s): HGBA1C in the last 72 hours. ------------------------------------------------------------------------------------------------------------------ No results for input(s): CHOL, HDL, LDLCALC, TRIG, CHOLHDL,  LDLDIRECT in the last 72 hours. ------------------------------------------------------------------------------------------------------------------ No results for input(s): TSH, T4TOTAL, T3FREE, THYROIDAB in the last 72 hours.  Invalid input(s): FREET3 ------------------------------------------------------------------------------------------------------------------ No results for input(s): VITAMINB12, FOLATE, FERRITIN, TIBC, IRON, RETICCTPCT in the last 72 hours.  Coagulation profile  Recent Labs Lab 09/29/14 2211  INR 1.79*    No results for input(s): DDIMER in the last 72 hours.  Cardiac Enzymes No results for input(s): CKMB, TROPONINI, MYOGLOBIN in the last 168 hours.  Invalid input(s): CK ------------------------------------------------------------------------------------------------------------------ Invalid input(s): [place]  10/03/14 0534 10/04/14 0636 10/05/14 0659 10/05/14 0952  GLUCAP 102* 102* 116* 109Reyne Dumas M.D. [facility] 10/05/2014, 11:29 AM  Pager: [phone]   Between 7am to 7pm - call Pager - [phone]  After 7pm go to [email or web address] - password [email or web address]  Call night coverage person covering after 7pm

## 2014-10-05 NOTE — Anesthesia Postprocedure Evaluation (Signed)
  Anesthesia Post-op Note  Patient: [name]  Procedure(s) Performed: Procedure(s): SUBTOTAL COLECTOMY WITH ILEORECTAL ANASTOMOSIS (N/A)  Patient Location: PACU  Anesthesia Type:General  Level of Consciousness: awake, alert , oriented and patient cooperative  Airway and Oxygen Therapy: Patient Spontanous Breathing and Patient connected to nasal cannula oxygen  Post-op Pain: mild  Post-op Assessment: Post-op Vital signs reviewed, Patient's Cardiovascular Status Stable, Respiratory Function Stable, Patent Airway, No signs of Nausea or vomiting and Pain level controlled  Post-op Vital Signs: Reviewed and stable  Last Vitals:  Filed Vitals:   10/05/14 1730  BP: 137/45  Pulse: 70  Temp: 36.8 C  Resp: 16    Complications: No apparent anesthesia complications

## 2014-10-05 NOTE — Anesthesia Procedure Notes (Signed)
Procedure Name: Intubation Date/Time: 10/05/2014 11:39 AM Performed by: Maryland Pink Pre-anesthesia Checklist: Patient identified, Emergency Drugs available, Suction available, Patient being monitored and Timeout performed Patient Re-evaluated:Patient Re-evaluated prior to inductionOxygen Delivery Method: Circle system utilized Preoxygenation: Pre-oxygenation with 100% oxygen Intubation Type: IV induction Ventilation: Mask ventilation without difficulty Laryngoscope Size: Mac and 3 Grade View: Grade I Tube type: Oral Tube size: 7.5 mm Number of attempts: 1 Airway Equipment and Method: Stylet Placement Confirmation: ETT inserted through vocal cords under direct vision,  positive ETCO2 and breath sounds checked- equal and bilateral Secured at: 21 cm Tube secured with: Tape Dental Injury: Teeth and Oropharynx as per pre-operative assessment

## 2014-10-05 NOTE — Transfer of Care (Signed)
Immediate Anesthesia Transfer of Care Note  Patient: [name]  Procedure(s) Performed: Procedure(s): SUBTOTAL COLECTOMY WITH ILEORECTAL ANASTOMOSIS (N/A)  Patient Location: PACU  Anesthesia Type:General  Level of Consciousness: awake and alert   Airway & Oxygen Therapy: Patient Spontanous Breathing and Patient connected to nasal cannula oxygen  Post-op Assessment: Report given to RN and Post -op Vital signs reviewed and stable  Post vital signs: Reviewed and stable  Last Vitals:  Filed Vitals:   10/05/14 0502  BP: 99/60  Pulse: 86  Temp: 37.2 C  Resp: 18    Complications: No apparent anesthesia complications

## 2014-10-05 NOTE — Progress Notes (Signed)
Report given to philip lopez rn as caregiver 

## 2014-10-06 ENCOUNTER — Inpatient Hospital Stay (HOSPITAL_COMMUNITY): Payer: Medicare Other

## 2014-10-06 ENCOUNTER — Encounter (HOSPITAL_COMMUNITY): Payer: Self-pay | Admitting: Surgery

## 2014-10-06 DIAGNOSIS — J9601 Acute respiratory failure with hypoxia: Secondary | ICD-10-CM

## 2014-10-06 DIAGNOSIS — I482 Chronic atrial fibrillation: Secondary | ICD-10-CM

## 2014-10-06 DIAGNOSIS — I959 Hypotension, unspecified: Secondary | ICD-10-CM

## 2014-10-06 DIAGNOSIS — Z9889 Other specified postprocedural states: Secondary | ICD-10-CM

## 2014-10-06 DIAGNOSIS — J80 Acute respiratory distress syndrome: Secondary | ICD-10-CM

## 2014-10-06 LAB — COMPREHENSIVE METABOLIC PANEL
ALT: 12 U/L (ref 0–53)
AST: 15 U/L (ref 0–37)
Albumin: 2.3 g/dL — ABNORMAL LOW (ref 3.5–5.2)
Alkaline Phosphatase: 62 U/L (ref 39–117)
Anion gap: 8 (ref 5–15)
BUN: 11 mg/dL (ref 6–23)
CALCIUM: 7.9 mg/dL — AB (ref 8.4–10.5)
CO2: 28 mmol/L (ref 19–32)
Chloride: 99 mmol/L (ref 96–112)
Creatinine, Ser: 0.6 mg/dL (ref 0.50–1.35)
GFR calc non Af Amer: >90 mL/min (ref >90)
GLUCOSE: 118 mg/dL — AB (ref 70–99)
POTASSIUM: 3.9 mmol/L (ref 3.5–5.1)
SODIUM: 135 mmol/L (ref 135–145)
Total Bilirubin: 0.4 mg/dL (ref 0.3–1.2)
Total Protein: 5 g/dL — ABNORMAL LOW (ref 6.0–8.3)

## 2014-10-06 LAB — POCT I-STAT 3, ART BLOOD GAS (G3+)
Acid-Base Excess: 4 mmol/L — ABNORMAL HIGH (ref 0.0–2.0)
Bicarbonate: 28.8 mEq/L — ABNORMAL HIGH (ref 20.0–24.0)
O2 SAT: 100 %
PCO2 ART: 44.4 mmHg (ref 35.0–45.0)
TCO2: 30 mmol/L (ref 0–100)
pH, Arterial: 7.42 (ref 7.350–7.450)
pO2, Arterial: 374 mmHg — ABNORMAL HIGH (ref 80.0–100.0)

## 2014-10-06 LAB — BLOOD GAS, ARTERIAL
ACID-BASE EXCESS: 4.3 mmol/L — AB (ref 0.0–2.0)
Bicarbonate: 29.3 mEq/L — ABNORMAL HIGH (ref 20.0–24.0)
Drawn by: 277331
FIO2: 0.55 %
O2 Saturation: 74.7 %
PH ART: 7.368 (ref 7.350–7.450)
Patient temperature: 98.7
TCO2: 30.9 mmol/L (ref 0–100)
pCO2 arterial: 52.2 mmHg — ABNORMAL HIGH (ref 35.0–45.0)
pO2, Arterial: 44.1 mmHg — ABNORMAL LOW (ref 80.0–100.0)

## 2014-10-06 LAB — CBC
HCT: 28.2 % — ABNORMAL LOW (ref 39.0–52.0)
HEMOGLOBIN: 9.1 g/dL — AB (ref 13.0–17.0)
MCH: 31.1 pg (ref 26.0–34.0)
MCHC: 32.3 g/dL (ref 30.0–36.0)
MCV: 96.2 fL (ref 78.0–100.0)
Platelets: 111 10*3/uL — ABNORMAL LOW (ref 150–400)
RBC: 2.93 MIL/uL — AB (ref 4.22–5.81)
RDW: 13.4 % (ref 11.5–15.5)
WBC: 8.2 10*3/uL (ref 4.0–10.5)

## 2014-10-06 LAB — TRIGLYCERIDES: Triglycerides: 83 mg/dL (ref <150)

## 2014-10-06 LAB — GLUCOSE, CAPILLARY: Glucose-Capillary: 112 mg/dL — ABNORMAL HIGH (ref 70–99)

## 2014-10-06 MED ORDER — FUROSEMIDE 10 MG/ML IJ SOLN
40.0000 mg | Freq: Once | INTRAMUSCULAR | Status: AC
  Administered 2014-10-06: 40 mg via INTRAVENOUS

## 2014-10-06 MED ORDER — FENTANYL CITRATE (PF) 100 MCG/2ML IJ SOLN
100.0000 ug | Freq: Once | INTRAMUSCULAR | Status: AC
  Administered 2014-10-06: 100 ug via INTRAVENOUS

## 2014-10-06 MED ORDER — PROPOFOL 1000 MG/100ML IV EMUL
5.0000 ug/kg/min | INTRAVENOUS | Status: DC
  Administered 2014-10-07: 20 ug/kg/min via INTRAVENOUS
  Administered 2014-10-07 (×2): 15 ug/kg/min via INTRAVENOUS
  Filled 2014-10-06 (×3): qty 100

## 2014-10-06 MED ORDER — ALBUTEROL SULFATE (2.5 MG/3ML) 0.083% IN NEBU
2.5000 mg | INHALATION_SOLUTION | Freq: Four times a day (QID) | RESPIRATORY_TRACT | Status: DC
  Administered 2014-10-06 – 2014-10-08 (×6): 2.5 mg via RESPIRATORY_TRACT
  Filled 2014-10-06 (×6): qty 3

## 2014-10-06 MED ORDER — PROPOFOL 1000 MG/100ML IV EMUL: 5.0000 ug/kg/min | INTRAVENOUS | Status: DC

## 2014-10-06 MED ORDER — FENTANYL CITRATE (PF) 100 MCG/2ML IJ SOLN: 100.0000 ug | Freq: Once | INTRAMUSCULAR | Status: DC

## 2014-10-06 MED ORDER — AMIODARONE HCL IN DEXTROSE 360-4.14 MG/200ML-% IV SOLN
60.0000 mg/h | INTRAVENOUS | Status: AC
  Administered 2014-10-07 (×2): 60 mg/h via INTRAVENOUS
  Filled 2014-10-06 (×2): qty 200

## 2014-10-06 MED ORDER — IPRATROPIUM BROMIDE 0.02 % IN SOLN
0.5000 mg | Freq: Four times a day (QID) | RESPIRATORY_TRACT | Status: DC
  Administered 2014-10-06 – 2014-10-08 (×6): 0.5 mg via RESPIRATORY_TRACT
  Filled 2014-10-06 (×6): qty 2.5

## 2014-10-06 MED ORDER — ALBUTEROL SULFATE (2.5 MG/3ML) 0.083% IN NEBU
2.5000 mg | INHALATION_SOLUTION | RESPIRATORY_TRACT | Status: DC | PRN
  Administered 2014-10-08: 2.5 mg via RESPIRATORY_TRACT
  Filled 2014-10-06: qty 3

## 2014-10-06 MED ORDER — HEPARIN SODIUM (PORCINE) 5000 UNIT/ML IJ SOLN
5000.0000 [IU] | Freq: Three times a day (TID) | INTRAMUSCULAR | Status: DC
  Administered 2014-10-07 – 2014-10-13 (×22): 5000 [IU] via SUBCUTANEOUS
  Filled 2014-10-06 (×28): qty 1

## 2014-10-06 MED ORDER — FENTANYL CITRATE (PF) 2500 MCG/50ML IJ SOLN
25.0000 ug/h | INTRAMUSCULAR | Status: DC
  Administered 2014-10-06: 50 ug/h via INTRAVENOUS
  Filled 2014-10-06: qty 50

## 2014-10-06 MED ORDER — FUROSEMIDE 10 MG/ML IJ SOLN
INTRAMUSCULAR | Status: AC
  Filled 2014-10-06: qty 4

## 2014-10-06 MED ORDER — SODIUM CHLORIDE 0.9 % IV SOLN
25.0000 ug/h | INTRAVENOUS | Status: DC
  Administered 2014-10-06: 50 ug/h via INTRAVENOUS
  Administered 2014-10-07: 100 ug/h via INTRAVENOUS
  Filled 2014-10-06 (×2): qty 50

## 2014-10-06 MED ORDER — FENTANYL BOLUS VIA INFUSION: 50.0000 ug | INTRAVENOUS | Status: DC | PRN

## 2014-10-06 MED ORDER — SODIUM CHLORIDE 0.9 % IV SOLN
3.0000 ug/kg/min | INTRAVENOUS | Status: DC
  Administered 2014-10-06: 3 ug/kg/min via INTRAVENOUS
  Filled 2014-10-06: qty 20

## 2014-10-06 MED ORDER — MIDAZOLAM HCL 2 MG/2ML IJ SOLN: 1.0000 mg | INTRAMUSCULAR | Status: DC | PRN

## 2014-10-06 MED ORDER — SODIUM CHLORIDE 0.9 % IV BOLUS (SEPSIS)
1000.0000 mL | Freq: Once | INTRAVENOUS | Status: AC
  Administered 2014-10-06: 1000 mL via INTRAVENOUS

## 2014-10-06 MED ORDER — BUDESONIDE 0.25 MG/2ML IN SUSP
0.2500 mg | Freq: Four times a day (QID) | RESPIRATORY_TRACT | Status: DC
  Administered 2014-10-06: 0.25 mg via RESPIRATORY_TRACT
  Filled 2014-10-06 (×3): qty 2

## 2014-10-06 MED ORDER — DOCUSATE SODIUM 50 MG/5ML PO LIQD
100.0000 mg | Freq: Two times a day (BID) | ORAL | Status: DC | PRN
  Filled 2014-10-06: qty 10

## 2014-10-06 MED ORDER — FENTANYL CITRATE (PF) 100 MCG/2ML IJ SOLN
50.0000 ug | Freq: Once | INTRAMUSCULAR | Status: AC
  Administered 2014-10-06: 50 ug via INTRAVENOUS

## 2014-10-06 MED ORDER — AMIODARONE LOAD VIA INFUSION
150.0000 mg | Freq: Once | INTRAVENOUS | Status: AC
  Administered 2014-10-06: 150 mg via INTRAVENOUS
  Filled 2014-10-06: qty 83.34

## 2014-10-06 MED ORDER — PROPOFOL 1000 MG/100ML IV EMUL: 0.0000 ug/kg/min | INTRAVENOUS | Status: DC

## 2014-10-06 MED ORDER — SODIUM CHLORIDE 0.9 % IV SOLN: 25.0000 ug/h | INTRAVENOUS | Status: DC

## 2014-10-06 MED ORDER — ROCURONIUM BROMIDE 50 MG/5ML IV SOLN
50.0000 mg | Freq: Once | INTRAVENOUS | Status: AC
Start: 2014-10-06 — End: 2014-10-06
  Administered 2014-10-06: 50 mg via INTRAVENOUS

## 2014-10-06 MED ORDER — FAMOTIDINE IN NACL 20-0.9 MG/50ML-% IV SOLN
20.0000 mg | Freq: Two times a day (BID) | INTRAVENOUS | Status: DC
  Administered 2014-10-06 – 2014-10-12 (×13): 20 mg via INTRAVENOUS
  Filled 2014-10-06 (×13): qty 50

## 2014-10-06 MED ORDER — SODIUM CHLORIDE 0.9 % IV BOLUS (SEPSIS)
500.0000 mL | INTRAVENOUS | Status: AC | PRN
  Administered 2014-10-07 (×2): 500 mL via INTRAVENOUS
  Filled 2014-10-06 (×2): qty 500

## 2014-10-06 MED ORDER — ARTIFICIAL TEARS OP OINT
1.0000 "application " | TOPICAL_OINTMENT | Freq: Three times a day (TID) | OPHTHALMIC | Status: DC
  Administered 2014-10-06 – 2014-10-07 (×2): 1 via OPHTHALMIC
  Filled 2014-10-06: qty 3.5

## 2014-10-06 MED ORDER — CHLORHEXIDINE GLUCONATE 0.12 % MT SOLN
15.0000 mL | Freq: Two times a day (BID) | OROMUCOSAL | Status: DC
  Administered 2014-10-06 – 2014-10-14 (×15): 15 mL via OROMUCOSAL
  Filled 2014-10-06 (×16): qty 15

## 2014-10-06 MED ORDER — METOPROLOL TARTRATE 1 MG/ML IV SOLN
INTRAVENOUS | Status: AC
  Administered 2014-10-06: 14:00:00
  Filled 2014-10-06: qty 5

## 2014-10-06 MED ORDER — PIPERACILLIN-TAZOBACTAM 3.375 G IVPB
3.3750 g | Freq: Three times a day (TID) | INTRAVENOUS | Status: DC
  Administered 2014-10-06 – 2014-10-10 (×12): 3.375 g via INTRAVENOUS
  Filled 2014-10-06 (×14): qty 50

## 2014-10-06 MED ORDER — FENTANYL CITRATE (PF) 100 MCG/2ML IJ SOLN
50.0000 ug | Freq: Once | INTRAMUSCULAR | Status: DC
  Filled 2014-10-06: qty 2

## 2014-10-06 MED ORDER — PHENYLEPHRINE HCL 10 MG/ML IJ SOLN
0.0000 ug/min | INTRAVENOUS | Status: DC
  Administered 2014-10-06: 20 ug/min via INTRAVENOUS
  Filled 2014-10-06: qty 1

## 2014-10-06 MED ORDER — PROPOFOL 1000 MG/100ML IV EMUL
INTRAVENOUS | Status: AC
  Filled 2014-10-06: qty 100

## 2014-10-06 MED ORDER — BUDESONIDE 0.25 MG/2ML IN SUSP
0.2500 mg | Freq: Four times a day (QID) | RESPIRATORY_TRACT | Status: DC
  Filled 2014-10-06 (×3): qty 2

## 2014-10-06 MED ORDER — NALOXONE HCL 0.4 MG/ML IJ SOLN
INTRAMUSCULAR | Status: AC
  Administered 2014-10-06: 0.4 mg
  Filled 2014-10-06: qty 1

## 2014-10-06 MED ORDER — KCL-LACTATED RINGERS-D5W 20 MEQ/L IV SOLN
INTRAVENOUS | Status: DC
  Administered 2014-10-06 – 2014-10-07 (×2): via INTRAVENOUS
  Filled 2014-10-06 (×4): qty 1000

## 2014-10-06 MED ORDER — AMIODARONE HCL IN DEXTROSE 360-4.14 MG/200ML-% IV SOLN
30.0000 mg/h | INTRAVENOUS | Status: DC
  Administered 2014-10-07 – 2014-10-09 (×4): 30 mg/h via INTRAVENOUS
  Filled 2014-10-06 (×10): qty 200

## 2014-10-06 MED ORDER — CETYLPYRIDINIUM CHLORIDE 0.05 % MT LIQD
7.0000 mL | Freq: Four times a day (QID) | OROMUCOSAL | Status: DC
  Administered 2014-10-07 – 2014-10-14 (×25): 7 mL via OROMUCOSAL

## 2014-10-06 MED ORDER — FENTANYL BOLUS VIA INFUSION
25.0000 ug | INTRAVENOUS | Status: DC | PRN
  Filled 2014-10-06: qty 25

## 2014-10-06 MED ORDER — FENTANYL CITRATE (PF) 100 MCG/2ML IJ SOLN: 100.0000 ug | Freq: Once | INTRAMUSCULAR | Status: AC | PRN

## 2014-10-06 MED ORDER — DEXTROSE 5 % IV SOLN
0.0000 ug/min | INTRAVENOUS | Status: DC
  Administered 2014-10-06: 10 ug/min via INTRAVENOUS
  Filled 2014-10-06 (×2): qty 4

## 2014-10-06 MED ORDER — CALCIUM CARBONATE-VITAMIN D 500-200 MG-UNIT PO TABS
1.0000 | ORAL_TABLET | Freq: Every day | ORAL | Status: DC
  Administered 2014-10-09 – 2014-10-17 (×9): 1
  Filled 2014-10-06 (×13): qty 1

## 2014-10-06 MED ORDER — FENTANYL BOLUS VIA INFUSION
50.0000 ug | INTRAVENOUS | Status: DC | PRN
  Filled 2014-10-06: qty 50

## 2014-10-06 MED ORDER — HYDROMORPHONE HCL 1 MG/ML IJ SOLN: 1.0000 mg | INTRAMUSCULAR | Status: DC | PRN

## 2014-10-06 MED ORDER — BUDESONIDE 0.25 MG/2ML IN SUSP
0.2500 mg | Freq: Four times a day (QID) | RESPIRATORY_TRACT | Status: DC
  Administered 2014-10-07 (×2): 0.25 mg via RESPIRATORY_TRACT
  Filled 2014-10-06 (×6): qty 2

## 2014-10-06 MED ORDER — ETOMIDATE 2 MG/ML IV SOLN
20.0000 mg | Freq: Once | INTRAVENOUS | Status: AC
  Administered 2014-10-06: 20 mg via INTRAVENOUS

## 2014-10-06 MED ORDER — MIDAZOLAM HCL 2 MG/2ML IJ SOLN
INTRAMUSCULAR | Status: AC
  Administered 2014-10-06: 2 mg via INTRAVENOUS
  Filled 2014-10-06: qty 2

## 2014-10-06 MED ORDER — CISATRACURIUM BOLUS VIA INFUSION
0.0500 mg/kg | Freq: Once | INTRAVENOUS | Status: AC
  Administered 2014-10-06: 2.4 mg via INTRAVENOUS
  Filled 2014-10-06: qty 3

## 2014-10-06 MED ORDER — SODIUM CHLORIDE 0.9 % IV SOLN
25.0000 ug/h | INTRAVENOUS | Status: DC
  Filled 2014-10-06: qty 50

## 2014-10-06 MED ORDER — FENTANYL CITRATE (PF) 100 MCG/2ML IJ SOLN
INTRAMUSCULAR | Status: AC
  Administered 2014-10-06: 50 ug via INTRAVENOUS
  Filled 2014-10-06: qty 2

## 2014-10-06 MED ORDER — FENTANYL CITRATE (PF) 100 MCG/2ML IJ SOLN: 100.0000 ug | Freq: Once | INTRAMUSCULAR | Status: DC | PRN

## 2014-10-06 NOTE — Progress Notes (Signed)
Pt began showing pulse ox of 80% on 6L.  Venti Mask was applied to pt and pt was staying with his o2 sat at 81%.  Dr. Allyson Sabal was notified and informed me to contact surgery since they ordered the PCA pump.  Surgery was contacted and agreed to come see pt.  Dr. Allyson Sabal called to say she placed orders and pca pump was immediately discontinued.  Rapid Response nurse and CCM were notified at this point.  Pt was given Narcan, 5 mg Metoprolol, 50 mg Lasix.  Pt was ST at the time around 105 bpm.  NG tube was then placed on pt through left nare per MD order and was hooked up to suction.  Orders were received to transfer pt to ICU 2S15.  Pt was transported to the unit and receiving nurse was informed that I will be calling report.

## 2014-10-06 NOTE — Procedures (Signed)
Arterial Catheter Insertion Procedure Note [name] [id number] 12-28-1945  Procedure: Insertion of Arterial Catheter  Indications: Blood pressure monitoring and Frequent blood sampling  Procedure Details Consent: Unable to obtain consent because of emergent medical necessity. Time Out: Verified patient identification, verified procedure, site/side was marked, verified correct patient position, special equipment/implants available, medications/allergies/relevent history reviewed, required imaging and test results available.  Performed  Maximum sterile technique was used including antiseptics, cap, gloves, gown, hand hygiene, mask and sheet. Skin prep: Chlorhexidine; local anesthetic administered 20 gauge catheter was inserted into right radial artery using the Seldinger technique.  Evaluation Blood flow good; BP tracing good. Complications: No apparent complications.  Performed under direct MD supervision.  Performed using ultrasound guidance.  Wire visualized in vessel under ultrasound.   Placed by Marni Griffon ACNP  I was present for and supervised the entire procedure  Merton Border, MD ; [facility] [phone].  After 5:30 PM or weekends, call [phone]

## 2014-10-06 NOTE — Progress Notes (Signed)
[facility] Progress Note  1 Day Post-Op  Subjective: We were called to bedside by the nurse (10/06/14 - 1304) because the patient became hypoxemic with sats into the 80's.  She had increased his oxygen and he was not responding.  I recommended respiratory be called to bedside and the patient be started on a non-rebreather.     Objective: Vital signs in last 24 hours: Temp:  [97.3 F (36.3 C)-98.9 F (37.2 C)] 98.8 F (37.1 C) (04/28 1300) Pulse Rate:  [51-102] 99 (04/28 1413) Resp:  [7-35] 35 (04/28 1413) BP: (60-143)/(45-69) 60/50 mmHg (04/28 1413) SpO2:  [84 %-100 %] 100 % (04/28 1413) FiO2 (%):  [99 %-100 %] 100 % (04/28 1400) Weight:  [48.452 kg (106 lb 13.1 oz)] 48.452 kg (106 lb 13.1 oz) (04/28 0503) Last BM Date: 10/04/14  Intake/Output from previous day: 04/27 0701 - 04/28 0700 In: 2100 [I.V.:2100] Out: 1375 [Urine:1225; Blood:150] Intake/Output this shift:    PE: Gen: Alert, NAD, pleasant Card: tachycardia, AFIB with RVR Pulm: Rhonchi/wheezing, apparent respiratory distress with sats between 55-80 Abd: Soft, mildly distended, tender to palpation over incision site, diminished BS, no HSM, incisions C/D/I Ext:  All 4 extremities are thin, no edema, non-tender, no erythema  Lab Results:   Recent Labs  10/05/14 1908 10/06/14 0356  WBC 8.5 8.2  HGB 9.1* 9.1*  HCT 28.4* 28.2*  PLT 107* 111*   BMET  Recent Labs  10/05/14 0439 10/05/14 1908 10/06/14 0356  NA 132*  --  135  K 3.8  --  3.9  CL 100  --  99  CO2 25  --  28  GLUCOSE 101*  --  118*  BUN 16  --  11  CREATININE 0.52 0.74 0.60  CALCIUM 8.2*  --  7.9*   PT/INR No results for input(s): LABPROT, INR in the last 72 hours. CMP     Component Value Date/Time   NA 135 10/06/2014 0356   K 3.9 10/06/2014 0356   CL 99 10/06/2014 0356   CO2 28 10/06/2014 0356   GLUCOSE 118* 10/06/2014 0356   BUN 11 10/06/2014 0356   CREATININE 0.60 10/06/2014 0356   CALCIUM 7.9* 10/06/2014 0356    PROT 5.0* 10/06/2014 0356   ALBUMIN 2.3* 10/06/2014 0356   AST 15 10/06/2014 0356   ALT 12 10/06/2014 0356   ALKPHOS 62 10/06/2014 0356   BILITOT 0.4 10/06/2014 0356   GFRNONAA >90 10/06/2014 0356   GFRAA >90 10/06/2014 0356   Lipase     Component Value Date/Time   LIPASE 40 09/30/2014 0253      Assessment/Plan 1. LGI bleed secondary - Cecal & rectal mass with multiple polyps as well.  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 Fluid collection in the right abdomen - Xarelto 3. Gastritis, duodenitis 4. AFIB on Xarelto 5. Normocytic anemia ? Abl anemia Hgb down to 9.7 today 6. Leukocytosis 11.6 7. DVT proph - SCD's, blood thinners on hold given bleeding  ADDENDUM: 8.  Hypoxia with sats down to 60% -Activated rapid response and called CCM who urgently came to the bedside -Stat CXR - was not very impressive -Given 0.2 narcan, 5mg  metoprolol -NRB, respiratory obtained ABG PO2 43, NG  tube placed with 266mL brown output -Transferred him to the ICU now in 2S15, rapid sequence intubation, they plan on placing a central line     LOS: 7 days    Coralie Keens 10/06/2014, 2:23 PM Pager: [phone]

## 2014-10-06 NOTE — Progress Notes (Addendum)
[facility]                                                                              Patient Demographics  [name], is a 69 y.o. male, DOB - 1945/10/17, HER:[id number]  Admit date - 09/29/2014   Admitting Physician Ivor Costa, MD  Outpatient Primary MD for the patient is [name], MD  LOS - 7   Chief Complaint  Patient presents with  . 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 Patient was recently hospitalized from 4/16 to 4/18 because of GI bleeding. He had EGD on 4/18, which was negative for active bleeding. GI recommended proceeding with a colonoscopy, however since bleeding had resolved, patient refuse colonoscopy. His Xarelto was restarted on 4/19. He started having rectal bleeding again on the day of admission. He had 4 times of rectal bleeding with bright red blood per patient. He denied any lightheadedness, chest pain or shortness breath. Patient reports having severe abdominal pain over suprapubic area. He has mild nausea, but no vomiting or diarrhea.   Assessment & Plan   Hypoxemic respiratory failure Likely secondary to PCA Discontinue PCA, ABG, portable chest x-ray, lasix IV  Continue incentive spirometry Pulmonary toilet      GIB (gastrointestinal bleeding)/ lower GI bleed with cecal and rectal mass newly diagnosed - Recent EGD on 4/18 did not reveal any foci of bleeding - Continue to hold xarelto, perhaps he is not a good candidate for anticoagulation at this point with recurrent GI bleeding  - Continue PPI, patient underwent colonoscopy on 4/23, showed a cecal mass 4x 4 cm, rectal mass 4 x 5 cm, CEA pending - Per GI, both masses are adenoma with focal high-grade dysplasia on the biopsy report POD#1 s/p subtotal colectomy with ileorectal  anastamosis -NPO except ice/sips, IVF, pain control, antiemetics   Intraabdominal bleed/retroperitoneal hemorrhage: Likely from xarelto - CT abd pelvis showed 1.8 x 4.6 cm collection over the posterior right mid abdomen immediately posterior to several small bowel loops and inferior to the right kidney, likely retroperitoneal hematoma -Resume xarelto when OK with surgery, and follow H&H closely  Suprapubic Abdominal pain: unclear etiology, resolved - UA negative for UTI  Chronic atrial fibrillation - CHADS2-VASc Score is 2, patient is on xarelto,  Suspected  retroperitoneal hemorrhage present on CT scan on 4/22 however the patient's H&H has been stable. Restart anticoagulation with xarelto when OK with surgery , hemoglobin stable postoperatively - Continue Coreg  Hyperkalemia - Resolved  DEPRESSION: -  Stable. No suicidal or homicidal ideations. - Continue  Zoloft.   Thrombocytopenia: Chronic - Currently close to baseline  BPH: Stable -Continue with doxazosin and finasteride.  Protein-calorie malnutrition:  - Nutrition consult, will place on nutritional supplements  Chronic ischemic heart disease:  -continue coreg and pravastatin  Hyperlipidemia:  - Continue pravastatin - Lipid panel showed LDL 62, cholesterol 110  Hypertension: Well-controlled -Continue Coreg   Code Status: Full code  Family Communication:  Discussed in detail with the patient, all imaging results, lab results explained to the patient    Disposition Plan: Disposition per surgery, still nothing by mouth  Time Spent in minutes   25 minutes  Procedures  none  Consults   Gastroenterology General surgery  DVT Prophylaxis  SCD's  Medications  Scheduled Meds: . calcium-vitamin D  1 tablet Oral Daily  . carvedilol  12.5 mg Oral BID WC  . diltiazem  120 mg Oral Daily  . doxazosin  4 mg Oral Daily  . finasteride  5 mg Oral Daily  . heparin  5,000 Units Subcutaneous 3 times per day  .  HYDROmorphone PCA 0.3 mg/mL   Intravenous 6 times per day  . nicotine  21 mg Transdermal Daily  . pantoprazole  40 mg Oral Q0600  . pravastatin  10 mg Oral QHS  . sertraline  50 mg Oral q morning - 10a  . sodium chloride  3 mL Intravenous Q12H  . zolpidem  5 mg Oral QHS   Continuous Infusions: . lactated ringers 10 mL/hr at 10/05/14 1102   PRN Meds:.diphenhydrAMINE **OR** diphenhydrAMINE, guaiFENesin-dextromethorphan, hydrOXYzine, naloxone **AND** sodium chloride, ondansetron (ZOFRAN) IV   Antibiotics   Anti-infectives    Start     Dose/Rate Route Frequency Ordered Stop   10/05/14 1117  cefoTEtan in Dextrose 5% (CEFOTAN) 2-2.08 GM-% IVPB    Comments:  Jones, Tomika   : cabinet override      10/05/14 1117 10/05/14 2329   10/05/14 1045  cefoTEtan (CEFOTAN) 2 g in dextrose 5 % 50 mL IVPB     2 g 100 mL/hr over 30 Minutes Intravenous 30 min pre-op 10/04/14 1548 10/05/14 1140        Subjective:   [name] was seen and examined today.  Pt doing okay, c/o a lot of midline pain/soreness. No N/V, tolerating ice chips and sips  Objective:   Blood pressure 104/62, pulse 92, temperature 97.8 F (36.6 C), temperature source Oral, resp. rate 11, height 5\' 9"  (1.753 m), weight 48.452 kg (106 lb 13.1 oz), SpO2 96 %.  Wt Readings from Last 3 Encounters:  10/06/14 48.452 kg (106 lb 13.1 oz)  05/31/13 50.077 kg (110 lb 6.4 oz)  12/24/06 42.661 kg (94 lb 0.8 oz)     Intake/Output Summary (Last 24 hours) at 10/06/14 1138 Last data filed at 10/06/14 0009  Gross per 24 hour  Intake   2100 ml  Output   1375 ml  Net    725 ml    Exam  General: Alert and oriented x 3, NAD  HEENT:  PERRLA, EOMI, Anicteic Sclera  Neck: Supple, no JVD  CVS: S1 S2 clear no murmur rubs or gallops  Respiratory: Clear to auscultation bilaterally  Abdomen: Soft, NT, ND, NBS  Ext: no cyanosis clubbing or edema  Neuro: AAOx3, Cr N's II- XII. Strength 5/5 upper and lower extremities  bilaterally  Skin: No rashes  Psych: Normal affect and demeanor, alert and oriented x3    Data Review   Micro Results Recent Results (from the past 240 hour(s))  MRSA PCR Screening     Status: None   Collection Time: 09/30/14  1:10 AM  Result Value Ref Range Status   MRSA by PCR NEGATIVE NEGATIVE Final    Comment:        The GeneXpert MRSA Assay (FDA approved for NASAL specimens only), is one component of a comprehensive MRSA colonization surveillance program. It is not intended to diagnose MRSA  infection nor to guide or monitor treatment for MRSA infections.     Radiology Reports Dg Chest 2 View  09/21/2014   CLINICAL DATA:  Left-sided chest pain for 4 days.  EXAM: CHEST  2 VIEW  COMPARISON:  05/27/2013  FINDINGS: Unchanged positioning of dual-chamber pacer leads from the right. Negative cardiomediastinal silhouette. Streaky right infrahilar opacity. There is no edema, consolidation, effusion, or pneumothorax.  IMPRESSION: 1. No acute findings. 2. Mild scarring or atelectasis in the right infrahilar lung.   Electronically Signed   By: Monte Fantasia M.D.   On: 09/21/2014 22:29   Ct Abdomen Pelvis W Contrast  09/30/2014   CLINICAL DATA:  Patient presents with rectal bleeding. Recent hospitalization for rectal bleeding. EGD 09/26/2014 negative. Abdominal pain and suprapubic pain. Mild nausea.  EXAM: CT ABDOMEN AND PELVIS WITH CONTRAST  TECHNIQUE: Multidetector CT imaging of the abdomen and pelvis was performed using the standard protocol following bolus administration of intravenous contrast.  CONTRAST:  166mL OMNIPAQUE IOHEXOL 300 MG/ML  SOLN  COMPARISON:  10/26/2006  FINDINGS: Lung bases are within normal.  Cardiac pacer leads are present.  Abdominal images demonstrate evidence of previous cholecystectomy. There is dilatation of the common bile duct measuring 1.7 cm with mild prominence of the central intrahepatic ducts likely due to the postcholecystectomy state. No focal mass or  stone seen within the common bile duct. 4 mm hypodensity over the body of the pancreas unchanged. The spleen, liver and adrenal glands are within normal. Kidneys are normal size without hydronephrosis or nephrolithiasis. There is a 1.5 cm cyst over the upper pole of the left kidney and 1.2 cm cyst over the upper pole of the right kidney. Ureters are within normal. Appendix is not visualized. There is moderate calcified plaque over the abdominal aorta and iliac arteries. 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 There is minimal diverticulosis of the colon.  There is mild reversal of the a normal relationship of the SMA and SMV as the SMV is slightly anterior and to the left of the SMA, although this unchanged. Slight increased number of small bowel loops in the right upper quadrant although unchanged. These findings can be seen with an internal hernia. No evidence of bowel obstruction. There is a heterogeneous extraluminal collection immediately posterior to several small bowel loops in the right mid abdomen and located just inferior to the right kidney measuring 1.8 x 4.6 cm. This may represent a hemorrhagic collection. There is no evidence of bowel perforation/free air.  Pelvic images demonstrate a normal bladder, prostate and rectum. There is hardware over the right femur intact. Mild degenerate change of the spine and left hip.  IMPRESSION: Heterogeneous collection measuring 1.8 x 4.6 cm over the posterior right mid abdomen immediately posterior to several small bowel loops and inferior to the right kidney. This likely represents a small hemorrhagic collection with etiology uncertain. No evidence of free peritoneal air or bowel obstruction.  Stable reversal of the SMA to SMV relationship with slight increased number of small bowel loops in the right upper quadrant as may be due to internal hernia, however is unchanged from the prior exam.  Post  cholecystectomy prominence of the common bowel duct. Stable 4 mm hypodensity over the body of the pancreas unchanged from 2008.  Bilateral renal cysts.  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  These results were called by telephone at the time of interpretation  on 09/30/2014 at 1:32 pm to Dr. Tana Coast, who verbally acknowledged these results.   Electronically Signed   By: Marin Olp M.D.   On: 09/30/2014 13:33   Dg Hand 2 View Right  09/25/2014   CLINICAL DATA:  Right hand pain. Slammed door on fingers at home last Sunday, persistent pain.  EXAM: RIGHT HAND - 2 VIEW  COMPARISON:  None.  FINDINGS: Diffuse bony under mineralization. No fracture or dislocation. The alignment is maintained. 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 There are multiple, at least 4, small linear densities projecting in the soft tissues of the hand overlying the metacarpals consistent with foreign bodies. These measure up to 7 mm in length.  IMPRESSION: 1. No fracture or dislocation of the right hand. 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 2. Multiple, at least 4, small linear densities in the soft tissues of the hand consistent with foreign bodies. Acuity is uncertain.   Electronically Signed   By: Jeb Levering M.D.   On: 09/25/2014 01:36    CBC  Recent Labs Lab 09/29/14 2211  09/30/14 0600  10/02/14 0733 10/04/14 0550 10/05/14 0439 10/05/14 1908 10/06/14 0356  WBC 6.2  < > 6.0  --   --  11.6* 9.8 8.5 8.2  HGB 9.1*  < > 9.8*  < > 10.2* 9.7* 8.5* 9.1* 9.1*  HCT 27.9*  < > 30.8*  < > 31.8* 30.0* 26.1* 28.4* 28.2*  PLT 126*  < > 146*  --   --  122* 110* 107* 111*  MCV 98.9  < > 98.1  --   --  97.4 96.7 96.9 96.2  MCH 32.3  < > 31.2  --   --  31.5 31.5 31.1 31.1  MCHC 32.6  < > 31.8  --   --  32.3 32.6 32.0 32.3  RDW 13.8  < > 13.9  --   --  13.7 13.6 13.4 13.4  LYMPHSABS 2.5  --   --   --   --   --   --    --   --   MONOABS 0.5  --   --   --   --   --   --   --   --   EOSABS 0.2  --   --   --   --   --   --   --   --   BASOSABS 0.0  --   --   --   --   --   --   --   --   < > = values in this interval not displayed.  Chemistries   Recent Labs Lab 09/29/14 2211 09/30/14 0600  10/02/14 0437 10/03/14 0525 10/04/14 0550 10/05/14 0439 10/05/14 1908 10/06/14 0356  NA 137 138  < > 138 134* 135 132*  --  135  K 4.4 4.3  < > 4.5 6.0* 3.6 3.8  --  3.9  CL 104 107  < > 104 101 102 100  --  99  CO2 25 24  < > 27 26 25 25   --  28  GLUCOSE 115* 110*  < > 134* 109* 109* 101*  --  118*  BUN 25* 19  < > 12 25* 17 16  --  11  CREATININE 0.69 0.70  < > 0.73 0.76 0.56 0.52 0.74 0.60  CALCIUM 8.8 8.6  < > 8.8 8.8 8.2* 8.2*  --  7.9*  AST 16 14  --   --   --   --   --   --  15  ALT 12 13  --   --   --   --   --   --  12  ALKPHOS 83 81  --   --   --   --   --   --  62  BILITOT 0.3 0.4  --   --   --   --   --   --  0.4  < > = values in this interval not displayed. ------------------------------------------------------------------------------------------------------------------ estimated creatinine clearance is 61.5 mL/min (by C-G formula based on Cr of 0.6). ------------------------------------------------------------------------------------------------------------------ No results for input(s): HGBA1C in the last 72 hours. ------------------------------------------------------------------------------------------------------------------ No results for input(s): CHOL, HDL, LDLCALC, TRIG, CHOLHDL, LDLDIRECT in the last 72 hours. ------------------------------------------------------------------------------------------------------------------ No results for input(s): TSH, T4TOTAL, T3FREE, THYROIDAB in the last 72 hours.  Invalid input(s): FREET3 ------------------------------------------------------------------------------------------------------------------ No results for input(s): VITAMINB12,  FOLATE, FERRITIN, TIBC, IRON, RETICCTPCT in the last 72 hours.  Coagulation profile  Recent Labs Lab 09/29/14 2211  INR 1.79*    No results for input(s): DDIMER in the last 72 hours.  Cardiac Enzymes No results for input(s): CKMB, TROPONINI, MYOGLOBIN in the last 168 hours.  Invalid input(s): CK ------------------------------------------------------------------------------------------------------------------ Invalid input(s): [place]  10/04/14 0636 10/05/14 0659 10/05/14 0952 10/06/14 0659  GLUCAP 102* 116* [place] M.D. [facility] 10/06/2014, 11:38 AM  Pager: [phone]  Between 7am to 7pm - call Pager - [phone]  After 7pm go to [email or web address] - password [email or web address]  Call night coverage person covering after 7pm

## 2014-10-06 NOTE — Plan of Care (Signed)
Problem: Phase I Progression Outcomes Goal: Pain controlled with appropriate interventions Outcome: Not Progressing Still on PCA c/o a lot of pain

## 2014-10-06 NOTE — Procedures (Signed)
Central Venous Catheter Insertion Procedure Note [name] [id number] 10/31/1945  Procedure: Insertion of Central Venous Catheter Indications: Assessment of intravascular volume and Drug and/or fluid administration  Procedure Details Consent: Unable to obtain consent because of emergent medical necessity. Time Out: Verified patient identification, verified procedure, site/side was marked, verified correct patient position, special equipment/implants available, medications/allergies/relevent history reviewed, required imaging and test results available.  Performed  Maximum sterile technique was used including antiseptics, cap, gloves, gown, hand hygiene, mask and sheet. Skin prep: Chlorhexidine; local anesthetic administered A antimicrobial bonded/coated triple lumen catheter was placed in the left internal jugular vein using the Seldinger technique.  Evaluation Blood flow good Complications: No apparent complications Patient did tolerate procedure well. Chest X-ray ordered to verify placement.  CXR: pending.  Performed under direct MD supervision.  Performed using ultrasound guidance.  Wire visualized in vessel under ultrasound.   Placed by Marni Griffon ACNP  I was present for and supervised the entire procedure  Merton Border, MD ; [facility] [phone].  After 5:30 PM or weekends, call [phone]

## 2014-10-06 NOTE — Progress Notes (Signed)
Called urgently to bedside, patient in acute respiratory distress. Entered room with Catarina NP. Patient Alert and talking.  O2 sats 70s on 100% NRB. Place NG tube, placed on LWS Facilitated urgent transfer to ICU. Patient transferred to 2S15 via bed with O2 and heart monitor. Patient intubated upon arrival post sedation.

## 2014-10-06 NOTE — Progress Notes (Signed)
Pt transferred from 2w- alert to name- o2 93- MD and team at bedside to prepare to intubate.  Report taken from Davis via telephone.  Placed on monitor- vital signs taken, will continue to monitor.

## 2014-10-06 NOTE — Progress Notes (Signed)
**Note De-identified  Obfuscation** Sputum collected and sent to lab 

## 2014-10-06 NOTE — Progress Notes (Addendum)
eLink Physician-Brief Progress Note Patient Name: DMONI FORTSON DOB: 1946-05-01 MRN: [id number]   Date of Service  10/06/2014  HPI/Events of Note  Chronic AFIB/AFlutter with ventricular rate = 133, Hypotension with BP = 83/49 and oliguria.  eICU Interventions  Will order: 1. BMP and Magnesium level now. 2. 0.9 NaCl 1 liter IV over 1 hour now.  3. Amiodarone IV bolus and IV infusion.  4. Titrate current Phenylephrine IV infusion up to goal MAP.      Intervention Category Major Interventions: Arrhythmia - evaluation and management;Hypotension - evaluation and management  Lysle Dingwall 10/06/2014, 11:29 PM

## 2014-10-06 NOTE — Consult Note (Signed)
PULMONARY / CRITICAL CARE MEDICINE   Name: [name] MRN: [id number] DOB: 1946/03/27    ADMISSION DATE:  09/29/2014 CONSULTATION DATE:  4/28   REFERRING MD :  Allyson Sabal   CHIEF COMPLAINT:  Acute respiratory failure   INITIAL PRESENTATION:  69yo male smoker with hx AFib on xarelto, gout, depression, hx GI bleed.  Recently admitted from 4/16 to 4/18 because of GI bleeding. He had EGD on 4/18, which was negative for active bleeding. Ultimately found to have cecal wall and rectal mass on f/u colonoscopy.  Returned 4/21 with rectal bleeding and ultimately underwent subtotal colectomy 4/27.  Was doing well post op until 4/28 when he developed acute respiratory distress, hypoxia with sats 60% despite NRB and PCCM called for ICU tx and emergent intubation.    STUDIES:  CT abd 4/22>>>Heterogeneous collection measuring 1.8 x 4.6 cm over the posterior right mid abdomen immediately posterior to several small bowel loops and inferior to the right kidney. This likely represents a small hemorrhagic collection with etiology uncertain. No evidence of free peritoneal air or bowel obstruction.  SIGNIFICANT EVENTS: 4/27 OR>> subtotal colectomy  4/28> respiratory failure, tx ICU, intubation   HISTORY OF PRESENT ILLNESS:   69yo male smoker with hx AFib on xarelto, gout, depression, hx GI bleed.  Recently admitted from 4/16 to 4/18 because of GI bleeding. He had EGD on 4/18, which was negative for active bleeding. Ultimately found to have cecal wall and rectal mass on f/u colonoscopy.  Returned 4/21 with rectal bleeding and ultimately underwent subtotal colectomy 4/27.  Was doing well post op until 4/28 when he developed acute respiratory distress, hypoxia with sats 60% despite NRB and PCCM called for ICU tx and emergent intubation.   PAST MEDICAL HISTORY :   has a past medical history of Depression; Hypomagnesemia; Dyspepsia; Fall at nursing home (05/27/2013); Migraine; Gout attack; Complete heart block; Atrial  fibrillation; HLD (hyperlipidemia); GIB (gastrointestinal bleeding); and BPH (benign prostatic hyperplasia).  has past surgical history that includes Insert / replace / remove pacemaker; Appendectomy; Cholecystectomy; Open reduction of hip (Right, 05/28/2013); Esophagogastroduodenoscopy (egd) with propofol (N/A, 09/26/2014); Colonoscopy (N/A, 10/01/2014); and Partial colectomy (N/A, 10/05/2014). Prior to Admission medications   Medication Sig Start Date End Date Taking? Authorizing Provider  Calcium Carbonate-Vit D-Min (CALTRATE 600+D PLUS MINERALS) 600-800 MG-UNIT TABS Take 1 tablet by mouth daily.   Yes Historical Provider, MD  carvedilol (COREG) 12.5 MG tablet Take 12.5 mg by mouth 2 (two) times daily with a meal.   Yes Historical Provider, MD  docusate sodium (COLACE) 100 MG capsule Take 1 capsule (100 mg total) by mouth 2 (two) times daily. Continue this while taking narcotics to help with bowel movements 05/28/13  Yes Renette Butters, MD  doxazosin (CARDURA) 4 MG tablet Take 4 mg by mouth every morning.   Yes Historical Provider, MD  finasteride (PROSCAR) 5 MG tablet Take 5 mg by mouth every morning.   Yes Historical Provider, MD  lisinopril (PRINIVIL,ZESTRIL) 5 MG tablet Take 5 mg by mouth every morning.   Yes Historical Provider, MD  magnesium oxide (MAG-OX) 400 MG tablet Take 400 mg by mouth 2 (two) times daily.   Yes Historical Provider, MD  pravastatin (PRAVACHOL) 10 MG tablet Take 10 mg by mouth at bedtime.   Yes Historical Provider, MD  rivaroxaban (XARELTO) 20 MG TABS tablet Take 1 tablet (20 mg total) by mouth every morning. Resume 4/19 09/27/14  Yes Shanker Kristeen Mans, MD  sertraline (ZOLOFT) 50 MG tablet  Take 50 mg by mouth every morning.    Yes Historical Provider, MD  traMADol (ULTRAM) 50 MG tablet Take 1 tablet (50 mg total) by mouth 2 (two) times daily. 09/26/14  Yes Shanker Kristeen Mans, MD  zolpidem (AMBIEN) 5 MG tablet Take 5 mg by mouth at bedtime.   Yes Historical Provider, MD   traMADol (ULTRAM) 50 MG tablet Take 1 tablet (50 mg total) by mouth every 6 (six) hours as needed. 09/26/14   Shanker Kristeen Mans, MD   No Known Allergies  FAMILY HISTORY:  has no family status information on file.  SOCIAL HISTORY:  reports that he has quit smoking. His smoking use included Cigarettes. He has a 15.25 pack-year smoking history. He has quit using smokeless tobacco. His smokeless tobacco use included Snuff and Chew. He reports that he drinks alcohol.  REVIEW OF SYSTEMS:  Unable.  Pt lethargic, critically ill.   SUBJECTIVE:   VITAL SIGNS: Temp:  [97.3 F (36.3 C)-98.9 F (37.2 C)] 98.8 F (37.1 C) (04/28 1300) Pulse Rate:  [51-102] 99 (04/28 1413) Resp:  [7-35] 35 (04/28 1413) BP: (60-143)/(45-69) 60/50 mmHg (04/28 1413) SpO2:  [84 %-100 %] 100 % (04/28 1413) FiO2 (%):  [99 %-100 %] 100 % (04/28 1400) Weight:  [106 lb 13.1 oz (48.452 kg)] 106 lb 13.1 oz (48.452 kg) (04/28 0503) HEMODYNAMICS:   VENTILATOR SETTINGS: Vent Mode:  [-] PRVC FiO2 (%):  [99 %-100 %] 100 % Set Rate:  [35 bmp] 35 bmp Vt Set:  [400 mL] 400 mL PEEP:  [15 cmH20] 15 cmH20 INTAKE / OUTPUT:  Intake/Output Summary (Last 24 hours) at 10/06/14 1432 Last data filed at 10/06/14 0009  Gross per 24 hour  Intake   1100 ml  Output    400 ml  Net    700 ml    PHYSICAL EXAMINATION: General:  69 year old male, currently in acute distress, w/ marked accessory muscle use  Neuro:  Awake, alert, anxious, moves all ext  HEENT:  [place], edentulous.  Cardiovascular:  Initially tachy irreg w/ AF, now AF  Lungs:  Basilar rales, + accessory muscle use  Abdomen:  Mid-abd dressing intact, hypo-active. NGT now in place. Bilious drainage  Musculoskeletal:  Intact  Skin:  Intact   LABS:  CBC  Recent Labs Lab 10/05/14 0439 10/05/14 1908 10/06/14 0356  WBC 9.8 8.5 8.2  HGB 8.5* 9.1* 9.1*  HCT 26.1* 28.4* 28.2*  PLT 110* 107* 111*   Coag's  Recent Labs Lab 09/29/14 2211 09/30/14 0253  APTT  --  48*   INR 1.79*  --    BMET  Recent Labs Lab 10/04/14 0550 10/05/14 0439 10/05/14 1908 10/06/14 0356  NA 135 132*  --  135  K 3.6 3.8  --  3.9  CL 102 100  --  99  CO2 25 25  --  28  BUN 17 16  --  11  CREATININE 0.56 0.52 0.74 0.60  GLUCOSE 109* 101*  --  118*   Electrolytes  Recent Labs Lab 10/04/14 0550 10/05/14 0439 10/06/14 0356  CALCIUM 8.2* 8.2* 7.9*   Sepsis Markers No results for input(s): LATICACIDVEN, PROCALCITON, O2SATVEN in the last 168 hours. ABG  Recent Labs Lab 10/06/14 1313  PHART 7.368  PCO2ART 52.2*  PO2ART 44.1*   Liver Enzymes  Recent Labs Lab 09/29/14 2211 09/30/14 0600 10/06/14 0356  AST 16 14 15   ALT 12 13 12   ALKPHOS 83 81 62  BILITOT 0.3 0.4 0.4  ALBUMIN  3.3* 3.0* 2.3*   Cardiac Enzymes No results for input(s): TROPONINI, PROBNP in the last 168 hours. Glucose  Recent Labs Lab 10/02/14 0545 10/03/14 0534 10/04/14 0636 10/05/14 0659 10/05/14 0952 10/06/14 0659  GLUCAP 136* 102* 102* 116* 109* 112*    Imaging No results found.   ASSESSMENT / PLAN:  PULMONARY OETT 4/28>>> Acute hypoxic respiratory failure - peep responsive hypoxia  ARDS ?aspiration  P:   Intubation, mech vent - 8cc/kg  Peep 15  F/u ABG  ARDS protocol  Stat CXR  BD's  Paralytic x 48 hours   CARDIOVASCULAR CVL L IJ CVL 4/28>>> R rad aline 4/28>>> Shock  Hx HTN  AFib with RVR (chronic)  CHADS-VASc =2c P:  Chronic BB  Cont hold anticoagulation post op  Hold coreg, cardura, diltiazem for now  Titrate pressors as needed to keep MAP 65   RENAL No active issue  P:   Monitor chem  Concern for AKI with significant hypotension   GASTROINTESTINAL Rectal bleeding  Rectal mass Cecal mass S/p subtotal colectomy 4/27 Retroperitoneal bleed - small.   P:    Surgery following  NPO OG to suction  Famotidine   HEMATOLOGIC Anemia - mild  Thrombocytopenia  P:  Sq heparin  Hold full anticoagulation for now  F/u CBC   INFECTIOUS HCAP  v aspiration PNA/ ARDS P:   Sputum 4/28>>> Zosyn 4/28>>>  ENDOCRINE No active issue  P:   Monitor glucose on chem   NEUROLOGIC AMS - r/t severe respiratory failure, shock Paralytic r/t vent dyssynchrony, severe ARDS   P:   RASS goal: -5 Monitor TOF with paralytic    FAMILY  - Updates:  No family    Nickolas Madrid, NP 10/06/2014  2:32 PM Pager: [phone]) [phone] or [phone]  PCCM ATTENDING: I have reviewed pt's initial presentation, consultants notes and hospital database in detail.  The above assessment and plan was formulated under my direction.  In summary: 62 M with CAF and recent GIB one day s/p subtotal colectomy for cecal mass and rectal mass Developed rather sudden and rapidly progressive severe hypoxic respiratory failure requiring emergent intubation Upon my initial evaluation, his chest exam and ventilator mechanics were consistent with ARDS (severe noncompliance)  PIP and Pplat markedly elevated  No wheezing  Abdomen notably soft and nondistended  R>L basilar crackles  He improved with recruitment maneuver and PEEP However, his CXR is not c/w ARDS and reveals only LLL Atx and/or infiltrate He was transiently hypotensive around the time of intubation but this has essentially resolved He has CAF and rate is presently well controlled  Despite the relative lack of findings on current CXR, we are supporting with ARDS protocol and he is on NMB protocol He has been started on pip-tazo for PNA, possible aspiration A CVL has been placed and CVP goal of 12-15 established Because of AF, phenylephrine will be used to maintain MAP > 65 mmHG  We have discussed with Dr Donne Hazel of [facility]  45 minutes of independent CCM time was provided by me   Merton Border, MD;  PCCM service; Mobile [phone]

## 2014-10-06 NOTE — Progress Notes (Signed)
[facility] Progress Note  1 Day Post-Op  Subjective: Pt doing okay, c/o a lot of midline pain/soreness.  No N/V, tolerating ice chips and sips.  No flatus or BM yet.  Not been OOB.  Foley in place.    Objective: Vital signs in last 24 hours: Temp:  [97.3 F (36.3 C)-98.9 F (37.2 C)] 97.8 F (36.6 C) (04/28 0503) Pulse Rate:  [51-93] 92 (04/28 0647) Resp:  [7-22] 11 (04/28 0503) BP: (101-143)/(45-69) 104/62 mmHg (04/28 0647) SpO2:  [96 %-100 %] 96 % (04/28 0503) FiO2 (%):  [99 %-100 %] 99 % (04/28 0503) Weight:  [48.452 kg (106 lb 13.1 oz)] 48.452 kg (106 lb 13.1 oz) (04/28 0503) Last BM Date: 10/04/14  Intake/Output from previous day: 04/27 0701 - 04/28 0700 In: 2100 [I.V.:2100] Out: 1375 [Urine:1225; Blood:150] Intake/Output this shift:    PE: Gen:  Alert, NAD, pleasant Card:  RRR, no M/G/R heard Pulm:  Crackles and rhonchi heard b/l, low effort, IS to 500 Abd: Soft, mildly distended, tender to palpation over incision site, diminished BS, no HSM, incisions C/D/I   Lab Results:   Recent Labs  10/05/14 1908 10/06/14 0356  WBC 8.5 8.2  HGB 9.1* 9.1*  HCT 28.4* 28.2*  PLT 107* 111*   BMET  Recent Labs  10/05/14 0439 10/05/14 1908 10/06/14 0356  NA 132*  --  135  K 3.8  --  3.9  CL 100  --  99  CO2 25  --  28  GLUCOSE 101*  --  118*  BUN 16  --  11  CREATININE 0.52 0.74 0.60  CALCIUM 8.2*  --  7.9*   PT/INR No results for input(s): LABPROT, INR in the last 72 hours. CMP     Component Value Date/Time   NA 135 10/06/2014 0356   K 3.9 10/06/2014 0356   CL 99 10/06/2014 0356   CO2 28 10/06/2014 0356   GLUCOSE 118* 10/06/2014 0356   BUN 11 10/06/2014 0356   CREATININE 0.60 10/06/2014 0356   CALCIUM 7.9* 10/06/2014 0356   PROT 5.0* 10/06/2014 0356   ALBUMIN 2.3* 10/06/2014 0356   AST 15 10/06/2014 0356   ALT 12 10/06/2014 0356   ALKPHOS 62 10/06/2014 0356   BILITOT 0.4 10/06/2014 0356   GFRNONAA >90 10/06/2014 0356   GFRAA >90  10/06/2014 0356   Lipase     Component Value Date/Time   LIPASE 40 09/30/2014 0253       Studies/Results: No results found.  Anti-infectives: Anti-infectives    Start     Dose/Rate Route Frequency Ordered Stop   10/05/14 1117  cefoTEtan in Dextrose 5% (CEFOTAN) 2-2.08 GM-% IVPB    Comments:  Jones, Tomika   : cabinet override      10/05/14 1117 10/05/14 2329   10/05/14 1045  cefoTEtan (CEFOTAN) 2 g in dextrose 5 % 50 mL IVPB     2 g 100 mL/hr over 30 Minutes Intravenous 30 min pre-op 10/04/14 1548 10/05/14 1140       Assessment/Plan 1. LGI bleed secondary - Cecal & rectal mass with multiple polyps as well.  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 Fluid collection in the right abdomen - Xarelto 3.  Gastritis, duodenitis 4. AFIB on Xarelto 5. Normocytic anemia ? Abl anemia Hgb down to 9.7 today 6. Leukocytosis 11.6 7. DVT proph - SCD's, blood thinners on hold given bleeding    LOS: 7 days    DORT, Arthi Mcdonald 10/06/2014, 7:51 AM Pager: [phone]

## 2014-10-06 NOTE — Procedures (Signed)
Intubation Procedure Note [name] [id number] 1945-06-26  Procedure: Intubation Indications: Respiratory insufficiency  Procedure Details Consent: Unable to obtain consent because of emergent medical necessity. Time Out: Verified patient identification, verified procedure, site/side was marked, verified correct patient position, special equipment/implants available, medications/allergies/relevent history reviewed, required imaging and test results available.  Performed   3 glidescope     Evaluation Hemodynamic Status: Persistent hypotension treated with pressors; O2 sats: persistent hypoxemia  Patient's Current Condition: stable Complications: No apparent complications Patient did tolerate procedure well. Chest X-ray ordered to verify placement.  CXR: pending.  Merton Border, MD ; [facility] [phone].  After 5:30 PM or weekends, call [phone]

## 2014-10-06 NOTE — Progress Notes (Signed)
ANTIBIOTIC CONSULT NOTE - INITIAL  Pharmacy Consult for Zosyn Indication: rule out pneumonia  No Known Allergies  Patient Measurements: Height: 5\' 9"  (175.3 cm) Weight: 106 lb 13.1 oz (48.452 kg) IBW/kg (Calculated) : 70.7 Vital Signs: Temp: 98.8 F (37.1 C) (04/28 1300) Temp Source: Oral (04/28 1300) BP: 60/50 mmHg (04/28 1413) Pulse Rate: 99 (04/28 1413) Intake/Output from previous day: 04/27 0701 - 04/28 0700 In: 2100 [I.V.:2100] Out: 1375 [Urine:1225; Blood:150] Intake/Output from this shift:    Labs:  Recent Labs  10/05/14 0439 10/05/14 1908 10/06/14 0356  WBC 9.8 8.5 8.2  HGB 8.5* 9.1* 9.1*  PLT 110* 107* 111*  CREATININE 0.52 0.74 0.60   Estimated Creatinine Clearance: 61.5 mL/min (by C-G formula based on Cr of 0.6). No results for input(s): VANCOTROUGH, VANCOPEAK, VANCORANDOM, GENTTROUGH, GENTPEAK, GENTRANDOM, TOBRATROUGH, TOBRAPEAK, TOBRARND, AMIKACINPEAK, AMIKACINTROU, AMIKACIN in the last 72 hours.   Microbiology: Recent Results (from the past 720 hour(s))  MRSA PCR Screening     Status: None   Collection Time: 09/30/14  1:10 AM  Result Value Ref Range Status   MRSA by PCR NEGATIVE NEGATIVE Final    Comment:        The GeneXpert MRSA Assay (FDA approved for NASAL specimens only), is one component of a comprehensive MRSA colonization surveillance program. It is not intended to diagnose MRSA infection nor to guide or monitor treatment for MRSA infections.     Medical History: Past Medical History  Diagnosis Date  . Depression   . Hypomagnesemia   . Dyspepsia   . Fall at nursing home 05/27/2013    "slipped on water; said I broke my right hip" (05/27/2013)  . Migraine     "used to have them bad; haven't had one in awhile" (05/27/2013)  . Gout attack     "not too long ago; in my right foot" (05/27/2013)  . Complete heart block     reason for pacemaker/notes 05/27/2013  . Atrial fibrillation     [name] 05/27/2013  . HLD (hyperlipidemia)    . GIB (gastrointestinal bleeding)   . BPH (benign prostatic hyperplasia)     Medications:  Anti-infectives    Start     Dose/Rate Route Frequency Ordered Stop   10/05/14 1117  cefoTEtan in Dextrose 5% (CEFOTAN) 2-2.08 GM-% IVPB    Comments:  Ronnald Ramp, Tomika   : cabinet override      10/05/14 1117 10/05/14 2329   10/05/14 1045  cefoTEtan (CEFOTAN) 2 g in dextrose 5 % 50 mL IVPB     2 g 100 mL/hr over 30 Minutes Intravenous 30 min pre-op 10/04/14 1548 10/05/14 1140     Assessment: 69 year old male with rule out aspiration pneumonia to start Zosyn.  Current wbc is within normal limits and patient is afebrile. SCr is normal. Patient has good urine output. CXR is negative. Patient developed acute respiratory distress and required emergent intubation.   Goal of Therapy:  Clinical resolution of infection  Plan:  Zosyn 3.375g IV q8h- 4 hour infusion. Consider ability to narrow antibiotics.   Sloan Leiter, PharmD, BCPS Clinical Pharmacist [phone] 10/06/2014,2:40 PM

## 2014-10-07 ENCOUNTER — Inpatient Hospital Stay (HOSPITAL_COMMUNITY): Payer: Medicare Other

## 2014-10-07 DIAGNOSIS — J690 Pneumonitis due to inhalation of food and vomit: Secondary | ICD-10-CM

## 2014-10-07 DIAGNOSIS — J9601 Acute respiratory failure with hypoxia: Secondary | ICD-10-CM | POA: Diagnosis present

## 2014-10-07 DIAGNOSIS — R6521 Severe sepsis with septic shock: Secondary | ICD-10-CM

## 2014-10-07 DIAGNOSIS — A419 Sepsis, unspecified organism: Secondary | ICD-10-CM | POA: Diagnosis not present

## 2014-10-07 HISTORY — DX: Pneumonitis due to inhalation of food and vomit: J69.0

## 2014-10-07 LAB — COMPREHENSIVE METABOLIC PANEL
ALK PHOS: 54 U/L (ref 39–117)
ALT: 15 U/L (ref 0–53)
AST: 22 U/L (ref 0–37)
Albumin: 1.9 g/dL — ABNORMAL LOW (ref 3.5–5.2)
Anion gap: 9 (ref 5–15)
BILIRUBIN TOTAL: 0.5 mg/dL (ref 0.3–1.2)
BUN: 15 mg/dL (ref 6–23)
CO2: 25 mmol/L (ref 19–32)
CREATININE: 0.95 mg/dL (ref 0.50–1.35)
Calcium: 7.9 mg/dL — ABNORMAL LOW (ref 8.4–10.5)
Chloride: 100 mmol/L (ref 96–112)
GFR, EST NON AFRICAN AMERICAN: 84 mL/min — AB (ref >90)
Glucose, Bld: 275 mg/dL — ABNORMAL HIGH (ref 70–99)
POTASSIUM: 4.1 mmol/L (ref 3.5–5.1)
SODIUM: 134 mmol/L — AB (ref 135–145)
Total Protein: 4.9 g/dL — ABNORMAL LOW (ref 6.0–8.3)

## 2014-10-07 LAB — CBC
HCT: 29.3 % — ABNORMAL LOW (ref 39.0–52.0)
Hemoglobin: 9.9 g/dL — ABNORMAL LOW (ref 13.0–17.0)
MCH: 31.8 pg (ref 26.0–34.0)
MCHC: 33.8 g/dL (ref 30.0–36.0)
MCV: 94.2 fL (ref 78.0–100.0)
Platelets: 152 10*3/uL (ref 150–400)
RBC: 3.11 MIL/uL — AB (ref 4.22–5.81)
RDW: 13.5 % (ref 11.5–15.5)
WBC: 12.3 10*3/uL — ABNORMAL HIGH (ref 4.0–10.5)

## 2014-10-07 LAB — BLOOD GAS, ARTERIAL
ACID-BASE DEFICIT: 0 mmol/L (ref 0.0–2.0)
BICARBONATE: 24 meq/L (ref 20.0–24.0)
DRAWN BY: 418751
FIO2: 0.6 %
LHR: 35 {breaths}/min
MECHVT: 400 mL
O2 Saturation: 99.3 %
PATIENT TEMPERATURE: 98.5
PEEP: 15 cmH2O
PO2 ART: 235 mmHg — AB (ref 80.0–100.0)
TCO2: 25.2 mmol/L (ref 0–100)
pCO2 arterial: 38.3 mmHg (ref 35.0–45.0)
pH, Arterial: 7.413 (ref 7.350–7.450)

## 2014-10-07 LAB — POCT I-STAT 3, ART BLOOD GAS (G3+)
ACID-BASE EXCESS: 2 mmol/L (ref 0.0–2.0)
BICARBONATE: 28 meq/L — AB (ref 20.0–24.0)
O2 Saturation: 98 %
PCO2 ART: 46 mmHg — AB (ref 35.0–45.0)
PO2 ART: 104 mmHg — AB (ref 80.0–100.0)
TCO2: 29 mmol/L (ref 0–100)
pH, Arterial: 7.39 (ref 7.350–7.450)

## 2014-10-07 LAB — BASIC METABOLIC PANEL
Anion gap: 10 (ref 5–15)
BUN: 15 mg/dL (ref 6–23)
CHLORIDE: 99 mmol/L (ref 96–112)
CO2: 25 mmol/L (ref 19–32)
Calcium: 7.9 mg/dL — ABNORMAL LOW (ref 8.4–10.5)
Creatinine, Ser: 1.02 mg/dL (ref 0.50–1.35)
GFR calc Af Amer: 86 mL/min — ABNORMAL LOW (ref >90)
GFR calc non Af Amer: 74 mL/min — ABNORMAL LOW (ref >90)
Glucose, Bld: 248 mg/dL — ABNORMAL HIGH (ref 70–99)
Potassium: 4.2 mmol/L (ref 3.5–5.1)
Sodium: 134 mmol/L — ABNORMAL LOW (ref 135–145)

## 2014-10-07 LAB — MAGNESIUM: Magnesium: 1.4 mg/dL — ABNORMAL LOW (ref 1.5–2.5)

## 2014-10-07 MED ORDER — DEXTROSE-NACL 5-0.45 % IV SOLN
INTRAVENOUS | Status: DC
  Administered 2014-10-07: 10:00:00 via INTRAVENOUS

## 2014-10-07 MED ORDER — MAGNESIUM SULFATE 2 GM/50ML IV SOLN
2.0000 g | Freq: Once | INTRAVENOUS | Status: AC
  Administered 2014-10-07: 2 g via INTRAVENOUS
  Filled 2014-10-07: qty 50

## 2014-10-07 MED ORDER — BUDESONIDE 0.25 MG/2ML IN SUSP
0.2500 mg | Freq: Two times a day (BID) | RESPIRATORY_TRACT | Status: DC
  Administered 2014-10-07 – 2014-10-16 (×18): 0.25 mg via RESPIRATORY_TRACT
  Filled 2014-10-07 (×22): qty 2

## 2014-10-07 NOTE — Progress Notes (Signed)
UR Completed.  [phone]  

## 2014-10-07 NOTE — Progress Notes (Signed)
[facility]  [address] [place], [place] [address] Phone: [phone] FAX: [place] [phone] 1945/07/14  CARE TEAM:  PCP: [name], MD  Outpatient Care Team: Patient Care Team: [name], MD as PCP - General (Internal Medicine)  Inpatient Treatment Team: Treatment Team: Attending Provider: Wilhelmina Mcardle, MD; Consulting Physician: Nolon Nations, MD; Technician: Rana Snare, NT; Social Worker: Ross Ludwig, Latanya Presser; Rounding Team: Md Pccm, MD  Problem List:   Principal Problem:   GIB (gastrointestinal bleeding) Active Problems:   Protein-calorie malnutrition   Thrombocytopenia   Chronic ischemic heart disease   Atrial fibrillation   BPH (benign prostatic hyperplasia)   HLD (hyperlipidemia)   Depression   Abdominal pain   Hematochezia   Cecum mass   Rectal mass   Benign neoplasm of transverse colon   Benign neoplasm of sigmoid colon   2 Days Post-Op   Preop diagnosis: #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 Asst.: Dr. Stark Klein  Assessment  ARDS / PNA.  Guarded but improving s/p abd colectomy for: LGI bleed secondary - Cecal & rectal mass with multiple polyps Rectal mass - tubulovillous adenoma with high-grade dysplasia -Cecal mass - adenoma with high-grade dysplasia  Plan:  -cont vent/PEEP/pulmonary toilet for PNA/ARDS -OGT to LIWS.  Can try trophic TFs if +BM & off pressors in 1-2 days -f/u pathology --VTE prophylaxis- SCDs, etc  I updated the status of the patient to the ICU team & drs Falkner w PCCM.  I made recommendations.  I answered questions.  Understanding & appreciation was expressed.   Adin Hector, M.D., F.A.C.S. Gastrointestinal  and Minimally Invasive Surgery [facility] [address] [place], [place] [address] [phone] Main / Paging   10/07/2014  Subjective:  Intubated Secretions not bad LLL infiltrate on CXR  Objective:  Vital signs:  Filed Vitals:   10/07/14 0645 10/07/14 0700 10/07/14 0718 10/07/14 0732  BP: 133/60 124/75 129/60   Pulse: 57 65 71   Temp:    98.4 F (36.9 C)  TempSrc:    Oral  Resp: 26 34 35   Height:      Weight:      SpO2: 100% 100% 100%     Last BM Date: 10/04/14  Intake/Output   Yesterday:  04/28 0701 - 04/29 0700 In: 4116.8 [I.V.:1836.8; NG/GT:30; IV Piggyback:2250] Out: 2065 [EMLJQ:4920; Emesis/NG output:600] This shift:     Bowel function:  Flatus: n  BM: n  Drain: thin bilious 300 q12h  Physical Exam:  General: Pt intubated/sedated Eyes: Closed.  PERRL, normal EOM.  Sclera clear.  No icterus Neuro: CN II-XII intact w/o focal sensory/motor deficits. Lymph: No head/neck/groin lymphadenopathy Psych:  No delerium/psychosis/paranoia HENT: Normocephalic, Mucus membranes moist.  No thrush.  ETT/OGT in place Neck: Supple, No tracheal deviation Chest: Coarse BS w good excursion on vent CV:  Pulses intact.  Regular rhythm MS: Normal AROM mjr joints.  No obvious deformity Abdomen: Soft.  Nondistended.  Flat.  Incision c/d/i.  No evidence of peritonitis.  No incarcerated hernias. Ext:  SCDs BLE.  No mjr edema.  No cyanosis Skin: No petechiae / purpura  Results:   Labs: Results for orders placed or performed during the hospital encounter of 09/29/14 (from the past 48 hour(s))  Glucose,  capillary     Status: Abnormal   Collection Time: 10/05/14  9:52 AM  Result Value Ref Range   Glucose-Capillary 109 (H) 70 - 99 mg/dL   Comment 1 Repeat Test    Comment 2 Document in Chart   Type and screen     Status: None   Collection Time: 10/05/14 10:47 AM  Result Value Ref Range   ABO/RH(D) O POS    Antibody Screen NEG     Sample Expiration 10/08/2014   CBC     Status: Abnormal   Collection Time: 10/05/14  7:08 PM  Result Value Ref Range   WBC 8.5 4.0 - 10.5 K/uL   RBC 2.93 (L) 4.22 - 5.81 MIL/uL   Hemoglobin 9.1 (L) 13.0 - 17.0 g/dL   HCT 28.4 (L) 39.0 - 52.0 %   MCV 96.9 78.0 - 100.0 fL   MCH 31.1 26.0 - 34.0 pg   MCHC 32.0 30.0 - 36.0 g/dL   RDW 13.4 11.5 - 15.5 %   Platelets 107 (L) 150 - 400 K/uL    Comment: CONSISTENT WITH PREVIOUS RESULT  Creatinine, serum     Status: None   Collection Time: 10/05/14  7:08 PM  Result Value Ref Range   Creatinine, Ser 0.74 0.50 - 1.35 mg/dL   GFR calc non Af Amer >90 >90 mL/min   GFR calc Af Amer >90 >90 mL/min    Comment: (NOTE) The eGFR has been calculated using the CKD EPI equation. This calculation has not been validated in all clinical situations. eGFR's persistently <90 mL/min signify possible Chronic Kidney Disease.   CBC     Status: Abnormal   Collection Time: 10/06/14  3:56 AM  Result Value Ref Range   WBC 8.2 4.0 - 10.5 K/uL   RBC 2.93 (L) 4.22 - 5.81 MIL/uL   Hemoglobin 9.1 (L) 13.0 - 17.0 g/dL   HCT 28.2 (L) 39.0 - 52.0 %   MCV 96.2 78.0 - 100.0 fL   MCH 31.1 26.0 - 34.0 pg   MCHC 32.3 30.0 - 36.0 g/dL   RDW 13.4 11.5 - 15.5 %   Platelets 111 (L) 150 - 400 K/uL    Comment: CONSISTENT WITH PREVIOUS RESULT  Comprehensive metabolic panel     Status: Abnormal   Collection Time: 10/06/14  3:56 AM  Result Value Ref Range   Sodium 135 135 - 145 mmol/L   Potassium 3.9 3.5 - 5.1 mmol/L   Chloride 99 96 - 112 mmol/L   CO2 28 19 - 32 mmol/L   Glucose, Bld 118 (H) 70 - 99 mg/dL   BUN 11 6 - 23 mg/dL   Creatinine, Ser 0.60 0.50 - 1.35 mg/dL   Calcium 7.9 (L) 8.4 - 10.5 mg/dL   Total Protein 5.0 (L) 6.0 - 8.3 g/dL   Albumin 2.3 (L) 3.5 - 5.2 g/dL   AST 15 0 - 37 U/L   ALT 12 0 - 53 U/L   Alkaline Phosphatase 62 39 - 117 U/L   Total Bilirubin 0.4 0.3 - 1.2 mg/dL   GFR calc non Af Amer >90 >90 mL/min   GFR calc Af Amer >90 >90 mL/min     Comment: (NOTE) The eGFR has been calculated using the CKD EPI equation. This calculation has not been validated in all clinical situations. eGFR's persistently <90 mL/min signify possible Chronic Kidney Disease.    Anion gap 8 5 - 15  Glucose, capillary     Status: Abnormal   Collection Time:  10/06/14  6:59 AM  Result Value Ref Range   Glucose-Capillary 112 (H) 70 - 99 mg/dL   Comment 1 Notify RN   Blood gas, arterial     Status: Abnormal   Collection Time: 10/06/14  1:13 PM  Result Value Ref Range   FIO2 0.55 %   Delivery systems OXYGEN MASK    pH, Arterial 7.368 7.350 - 7.450   pCO2 arterial 52.2 (H) 35.0 - 45.0 mmHg   pO2, Arterial 44.1 (L) 80.0 - 100.0 mmHg   Bicarbonate 29.3 (H) 20.0 - 24.0 mEq/L   TCO2 30.9 0 - 100 mmol/L   Acid-Base Excess 4.3 (H) 0.0 - 2.0 mmol/L   O2 Saturation 74.7 %   Patient temperature 98.7    Collection site LEFT RADIAL    Drawn by [id number]    Sample type ARTERIAL DRAW    Allens test (pass/fail) PASS PASS  Culture, respiratory (NON-Expectorated)     Status: None (Preliminary result)   Collection Time: 10/06/14  3:45 PM  Result Value Ref Range   Specimen Description TRACHEAL ASPIRATE    Special Requests NONE    Gram Stain PENDING    Culture      NO GROWTH 1 DAY Performed at [facility]    Report Status PENDING   Triglycerides     Status: None   Collection Time: 10/06/14  4:12 PM  Result Value Ref Range   Triglycerides 83 <150 mg/dL  I-STAT 3, arterial blood gas (G3+)     Status: Abnormal   Collection Time: 10/06/14  8:43 PM  Result Value Ref Range   pH, Arterial 7.420 7.350 - 7.450   pCO2 arterial 44.4 35.0 - 45.0 mmHg   pO2, Arterial 374.0 (H) 80.0 - 100.0 mmHg   Bicarbonate 28.8 (H) 20.0 - 24.0 mEq/L   TCO2 30 0 - 100 mmol/L   O2 Saturation 100.0 %   Acid-Base Excess 4.0 (H) 0.0 - 2.0 mmol/L   Sample type ARTERIAL   Basic metabolic panel     Status: Abnormal   Collection Time: 10/06/14 11:43 PM  Result Value Ref Range    Sodium 134 (L) 135 - 145 mmol/L   Potassium 4.2 3.5 - 5.1 mmol/L   Chloride 99 96 - 112 mmol/L   CO2 25 19 - 32 mmol/L   Glucose, Bld 248 (H) 70 - 99 mg/dL   BUN 15 6 - 23 mg/dL   Creatinine, Ser 1.02 0.50 - 1.35 mg/dL    Comment: DELTA CHECK NOTED   Calcium 7.9 (L) 8.4 - 10.5 mg/dL   GFR calc non Af Amer 74 (L) >90 mL/min   GFR calc Af Amer 86 (L) >90 mL/min    Comment: (NOTE) The eGFR has been calculated using the CKD EPI equation. This calculation has not been validated in all clinical situations. eGFR's persistently <90 mL/min signify possible Chronic Kidney Disease.    Anion gap 10 5 - 15  Magnesium     Status: Abnormal   Collection Time: 10/06/14 11:43 PM  Result Value Ref Range   Magnesium 1.4 (L) 1.5 - 2.5 mg/dL  Blood gas, arterial     Status: Abnormal   Collection Time: 10/07/14  3:25 AM  Result Value Ref Range   FIO2 0.60 %   Delivery systems VENTILATOR    Mode PRESSURE REGULATED VOLUME CONTROL    VT 400 mL   Rate 35 resp/min   Peep/cpap 15.0 cm H20   pH, Arterial 7.413 7.350 - 7.450  pCO2 arterial 38.3 35.0 - 45.0 mmHg   pO2, Arterial 235 (H) 80.0 - 100.0 mmHg   Bicarbonate 24.0 20.0 - 24.0 mEq/L   TCO2 25.2 0 - 100 mmol/L   Acid-base deficit 0.0 0.0 - 2.0 mmol/L   O2 Saturation 99.3 %   Patient temperature 98.5    Collection site ARTERIAL LINE    Drawn by [id number]    Sample type ARTERIAL DRAW   CBC     Status: Abnormal   Collection Time: 10/07/14  3:50 AM  Result Value Ref Range   WBC 12.3 (H) 4.0 - 10.5 K/uL   RBC 3.11 (L) 4.22 - 5.81 MIL/uL   Hemoglobin 9.9 (L) 13.0 - 17.0 g/dL   HCT 29.3 (L) 39.0 - 52.0 %   MCV 94.2 78.0 - 100.0 fL   MCH 31.8 26.0 - 34.0 pg   MCHC 33.8 30.0 - 36.0 g/dL   RDW 13.5 11.5 - 15.5 %   Platelets 152 150 - 400 K/uL    Comment: REPEATED TO VERIFY  Comprehensive metabolic panel     Status: Abnormal   Collection Time: 10/07/14  3:50 AM  Result Value Ref Range   Sodium 134 (L) 135 - 145 mmol/L   Potassium 4.1 3.5 - 5.1  mmol/L   Chloride 100 96 - 112 mmol/L   CO2 25 19 - 32 mmol/L   Glucose, Bld 275 (H) 70 - 99 mg/dL   BUN 15 6 - 23 mg/dL   Creatinine, Ser 0.95 0.50 - 1.35 mg/dL   Calcium 7.9 (L) 8.4 - 10.5 mg/dL   Total Protein 4.9 (L) 6.0 - 8.3 g/dL   Albumin 1.9 (L) 3.5 - 5.2 g/dL   AST 22 0 - 37 U/L   ALT 15 0 - 53 U/L   Alkaline Phosphatase 54 39 - 117 U/L   Total Bilirubin 0.5 0.3 - 1.2 mg/dL   GFR calc non Af Amer 84 (L) >90 mL/min   GFR calc Af Amer >90 >90 mL/min    Comment: (NOTE) The eGFR has been calculated using the CKD EPI equation. This calculation has not been validated in all clinical situations. eGFR's persistently <90 mL/min signify possible Chronic Kidney Disease.    Anion gap 9 5 - 15    Imaging / Studies: Dg Chest Port 1 View  10/07/2014   CLINICAL DATA:  Respiratory failure .  EXAM: PORTABLE CHEST - 1 VIEW  COMPARISON:  10/06/2014.  FINDINGS: Endotracheal tube, left IJ line, NG tube ends stable position. Mediastinum and hilar structures are normal. Heart size stable. Cardiac pacer in stable position. Developing diffuse left lower lobe infiltrate. Pneumonia could present in this fashion. Aspiration cannot be excluded. No pleural effusion or pneumothorax.  IMPRESSION: 1. Lines and tubes in stable position. 2. Developing prominent left lower lobe infiltrate consistent with pneumonia. Aspiration cannot be excluded.   Electronically Signed   By: Marcello Moores  Register   On: 10/07/2014 07:38   Dg Chest Port 1 View  10/06/2014   CLINICAL DATA:  Encounter for central line placement.  EXAM: PORTABLE CHEST - 1 VIEW  COMPARISON:  Same day.  FINDINGS: The heart size and mediastinal contours are within normal limits. Both lungs are clear. No pneumothorax or pleural effusion is noted. Endotracheal tube is in grossly good position with distal tip 6.4 cm above the carina. Left internal jugular catheter is noted with distal tip in expected position of SVC The visualized skeletal structures are  unremarkable.  IMPRESSION: No pneumothorax status post  left internal jugular catheter placement. Endotracheal tube in grossly good position.   Electronically Signed   By: Marijo Conception, M.D.   On: 10/06/2014 15:51   Dg Chest Port 1 View  10/06/2014   CLINICAL DATA:  Shortness of breath.  Decreased oxygen saturation.  EXAM: PORTABLE CHEST - 1 VIEW  COMPARISON:  Single view of the chest 05/27/2013. PA and lateral chest 09/21/2014.  FINDINGS: Pacing device in place. Heart size is upper normal. There is no pulmonary edema. Lungs are clear. No pneumothorax or pleural effusion.  IMPRESSION: No acute disease.   Electronically Signed   By: Inge Rise M.D.   On: 10/06/2014 14:15    Medications / Allergies: per chart  Antibiotics: Anti-infectives    Start     Dose/Rate Route Frequency Ordered Stop   10/06/14 1445  piperacillin-tazobactam (ZOSYN) IVPB 3.375 g     3.375 g 12.5 mL/hr over 240 Minutes Intravenous 3 times per day 10/06/14 1443     10/05/14 1117  cefoTEtan in Dextrose 5% (CEFOTAN) 2-2.08 GM-% IVPB    Comments:  Ronnald Ramp, [place]   : cabinet override      10/05/14 1117 10/05/14 2329   10/05/14 1045  cefoTEtan (CEFOTAN) 2 g in dextrose 5 % 50 mL IVPB     2 g 100 mL/hr over 30 Minutes Intravenous 30 min pre-op 10/04/14 1548 10/05/14 1140       Note: Portions of this report may have been transcribed using voice recognition software. Every effort was made to ensure accuracy; however, inadvertent computerized transcription errors may be present.   Any transcriptional errors that result from this process are unintentional.     Adin Hector, M.D., F.A.C.S. Gastrointestinal and Minimally Invasive Surgery [facility] [address], [place], [place] [address] [phone] Main / Paging   10/07/2014

## 2014-10-07 NOTE — Progress Notes (Signed)
[facility] Progress Note Patient Name: [name] DOB: Nov 20, 1945 MRN: [id number]   Date of Service  10/07/2014  HPI/Events of Note  K+ = 4.2, Mg++ = 1.4 and Creatinine = 1.02.  eICU Interventions  Replete Mg++.     Intervention Category Intermediate Interventions: Electrolyte abnormality - evaluation and management  Gerren Hoffmeier [place] 10/07/2014, 1:25 AM

## 2014-10-07 NOTE — Clinical Social Work Note (Signed)
CSW to continue to follow patient.  Patient is from [facility] discharge plan is to return, however patient may need SNF before returning.  FL2 has been signed and assessment has been completed.  Jones Broom. [place], MSW, [place]

## 2014-10-07 NOTE — Progress Notes (Signed)
PULMONARY / CRITICAL CARE MEDICINE   Name: [name] MRN: [id number] DOB: [birth year]    ADMISSION DATE:  09/29/2014 CONSULTATION DATE:  4/28   REFERRING MD :  Allyson Sabal   CHIEF COMPLAINT:  Acute respiratory failure   INITIAL PRESENTATION:  69yo male smoker with hx AFib on xarelto, gout, depression, hx GI bleed.  Recently admitted from 4/16 to 4/18 because of GI bleeding. He had EGD on 4/18, which was negative for active bleeding. Ultimately found to have cecal wall and rectal mass on f/u colonoscopy.  Returned 4/21 with rectal bleeding and ultimately underwent subtotal colectomy 4/27.  Was doing well post op until 4/28 when he developed acute respiratory distress, hypoxia with sats 60% despite NRB and PCCM called for ICU tx and emergent intubation.    STUDIES:  CT abd 4/22>>>Heterogeneous collection measuring 1.8 x 4.6 cm over the posterior right mid abdomen immediately posterior to several small bowel loops and inferior to the right kidney. This likely represents a small hemorrhagic collection with etiology uncertain. No evidence of free peritoneal air or bowel obstruction.  SIGNIFICANT EVENTS: 4/27 OR>> subtotal colectomy  4/28> respiratory failure, tx ICU, intubation   SUBJECTIVE:  A fib / flutter o/n, started on amio BP improved this am   VITAL SIGNS: Temp:  [97.5 F (36.4 C)-99 F (37.2 C)] 98.4 F (36.9 C) (04/29 0732) Pulse Rate:  [27-138] 71 (04/29 0718) Resp:  [13-42] 35 (04/29 0718) BP: (60-148)/(29-87) 129/60 mmHg (04/29 0718) SpO2:  [61 %-100 %] 100 % (04/29 0718) Arterial Line BP: (79-207)/(43-95) 135/61 mmHg (04/29 0700) FiO2 (%):  [50 %-100 %] 50 % (04/29 0718) HEMODYNAMICS: CVP:  [0 mmHg-22 mmHg] 7 mmHg VENTILATOR SETTINGS: Vent Mode:  [-] PRVC FiO2 (%):  [50 %-100 %] 50 % Set Rate:  [35 bmp] 35 bmp Vt Set:  [400 mL] 400 mL PEEP:  [10 cmH20-15 cmH20] 10 cmH20 Plateau Pressure:  [22 cmH20-32 cmH20] 22 cmH20 INTAKE / OUTPUT:  Intake/Output Summary (Last 24  hours) at 10/07/14 0902 Last data filed at 10/07/14 0700  Gross per 24 hour  Intake 4116.77 ml  Output   2065 ml  Net 2051.77 ml    PHYSICAL EXAMINATION: General:  69 year old male, sedated and intubated Neuro:  Awake, alert, anxious, moves all ext  HEENT:  , edentulous.  Cardiovascular:  Initially tachy irreg w/ AF, now AF  Lungs:  Basilar rales, + accessory muscle use  Abdomen:  Mid-abd dressing intact, hypo-active. NGT now in place. Bilious drainage  Musculoskeletal:  Intact  Skin:  Intact   LABS:  CBC  Recent Labs Lab 10/05/14 1908 10/06/14 0356 10/07/14 0350  WBC 8.5 8.2 12.3*  HGB 9.1* 9.1* 9.9*  HCT 28.4* 28.2* 29.3*  PLT 107* 111* 152   Coag's No results for input(s): APTT, INR in the last 168 hours. BMET  Recent Labs Lab 10/06/14 0356 10/06/14 2343 10/07/14 0350  NA 135 134* 134*  K 3.9 4.2 4.1  CL 99 99 100  CO2 28 25 25   BUN 11 15 15   CREATININE 0.60 1.02 0.95  GLUCOSE 118* 248* 275*   Electrolytes  Recent Labs Lab 10/06/14 0356 10/06/14 2343 10/07/14 0350  CALCIUM 7.9* 7.9* 7.9*  MG  --  1.4*  --    Sepsis Markers No results for input(s): LATICACIDVEN, PROCALCITON, O2SATVEN in the last 168 hours. ABG  Recent Labs Lab 10/06/14 1313 10/06/14 2043 10/07/14 0325  PHART 7.368 7.420 7.413  PCO2ART 52.2* 44.4 38.3  PO2ART 44.1*  374.0* 235*   Liver Enzymes  Recent Labs Lab 10/06/14 0356 10/07/14 0350  AST 15 22  ALT 12 15  ALKPHOS 62 54  BILITOT 0.4 0.5  ALBUMIN 2.3* 1.9*   Cardiac Enzymes No results for input(s): TROPONINI, PROBNP in the last 168 hours. Glucose  Recent Labs Lab 10/02/14 0545 10/03/14 0534 10/04/14 0636 10/05/14 0659 10/05/14 0952 10/06/14 0659  GLUCAP 136* 102* 102* 116* 109* 112*    Imaging Dg Chest Port 1 View  10/06/2014   CLINICAL DATA:  Encounter for central line placement.  EXAM: PORTABLE CHEST - 1 VIEW  COMPARISON:  Same day.  FINDINGS: The heart size and mediastinal contours are within  normal limits. Both lungs are clear. No pneumothorax or pleural effusion is noted. Endotracheal tube is in grossly good position with distal tip 6.4 cm above the carina. Left internal jugular catheter is noted with distal tip in expected position of SVC The visualized skeletal structures are unremarkable.  IMPRESSION: No pneumothorax status post left internal jugular catheter placement. Endotracheal tube in grossly good position.   Electronically Signed   By: Marijo Conception, M.D.   On: 10/06/2014 15:51   Dg Chest Port 1 View  10/06/2014   CLINICAL DATA:  Shortness of breath.  Decreased oxygen saturation.  EXAM: PORTABLE CHEST - 1 VIEW  COMPARISON:  Single view of the chest 05/27/2013. PA and lateral chest 09/21/2014.  FINDINGS: Pacing device in place. Heart size is upper normal. There is no pulmonary edema. Lungs are clear. No pneumothorax or pleural effusion.  IMPRESSION: No acute disease.   Electronically Signed   By: Inge Rise M.D.   On: 10/06/2014 14:15     ASSESSMENT / PLAN:  PULMONARY OETT 4/28>>> Acute hypoxic respiratory failure - peep responsive hypoxia  ARDS LLL PNA, ? aspiration P:   Intubation, mech vent - 8cc/kg; d/c ARDS protocol 4/29 Wean peep to 5, FiO2 to 0.40 F/u ABG  BD's  D./c paralysis 4/29  CARDIOVASCULAR CVL L IJ CVL 4/28>>> R rad aline 4/28>>> Shock  Hx HTN  AFib with RVR (chronic)  CHADS-VASc =2c P:  Chronic BB  Cont hold anticoagulation post op  Hold coreg, cardura, diltiazem for now given no enteral feeds / meds Titrate pressors as needed to keep MAP 65, wean to off Continue amiodarone gtt  RENAL Oliguria, no evidence ARF P:   Monitor chem  Concern for AKI with significant hypotension   GASTROINTESTINAL Rectal bleeding  Rectal mass Cecal mass S/p subtotal colectomy 4/27 Retroperitoneal bleed - small.   P:    Surgery following  NPO, start trickle TF when OK w CCS OG to suction  Famotidine   HEMATOLOGIC Anemia - mild   Thrombocytopenia  P:  Sq heparin  Hold full anticoagulation (A Fib) for now  F/u CBC   INFECTIOUS HCAP v aspiration PNA LLL  P:   Sputum 4/28>>> Zosyn 4/28>>>  ENDOCRINE No active issue  P:   Monitor glucose on chem   NEUROLOGIC AMS - r/t severe respiratory failure, shock Paralysis initiated due to vent dyssynchrony  P:   RASS goal: -2 D/c paralytics 4/29   FAMILY  - Updates:  No family; he is a nursing home resident, reportedly with no family or representative.    35 minutes of independent CCM time was provided by me   Baltazar Apo, MD, PhD 10/07/2014, 9:16 AM [facility] [phone] or if no answer [phone]

## 2014-10-07 NOTE — Progress Notes (Signed)
Inpatient Diabetes Program Recommendations  AACE/ADA: New Consensus Statement on Inpatient Glycemic Control (2013)  Target Ranges:  Prepandial:   less than 140 mg/dL      Peak postprandial:   less than 180 mg/dL (1-2 hours)      Critically ill patients:  140 - 180 mg/dL   Results for MAVRIC, CORTRIGHT (MRN [id number]) as of 10/07/2014 11:41  Ref. Range 10/04/2014 05:50 10/05/2014 04:39 10/06/2014 03:56 10/06/2014 23:43 10/07/2014 03:50  Glucose Latest Ref Range: 70-99 mg/dL 109 (H) 101 (H) 118 (H) 248 (H) 275 (H)    Diabetes history: NO Outpatient Diabetes medications: NA Current orders for Inpatient glycemic control: None  Inpatient Diabetes Program Recommendations Correction (SSI): Noted lab glucose 248 mg/dl at 23:43 on 4/28 and 275 mg/dl at 3:50 on 10/07/14. Question if lab glucose result of drawing blood from central line with IV fluids with dextrose running. At this time, please consider ordering CBGs with Novolog sensitive correction Q4H if needed.  Thanks, Barnie Alderman, RN, MSN, CCRN, CDE Diabetes Coordinator Inpatient Diabetes Program [phone] (Team Pager from [place] to [place]) [phone] ([facility] office) [phone] [facility] office)

## 2014-10-08 ENCOUNTER — Inpatient Hospital Stay (HOSPITAL_COMMUNITY): Payer: Medicare Other

## 2014-10-08 DIAGNOSIS — R6521 Severe sepsis with septic shock: Secondary | ICD-10-CM

## 2014-10-08 DIAGNOSIS — A419 Sepsis, unspecified organism: Secondary | ICD-10-CM

## 2014-10-08 DIAGNOSIS — E43 Unspecified severe protein-calorie malnutrition: Secondary | ICD-10-CM | POA: Diagnosis present

## 2014-10-08 LAB — BASIC METABOLIC PANEL
ANION GAP: 8 (ref 5–15)
BUN: 18 mg/dL (ref 6–23)
CHLORIDE: 100 mmol/L (ref 96–112)
CO2: 25 mmol/L (ref 19–32)
Calcium: 7.7 mg/dL — ABNORMAL LOW (ref 8.4–10.5)
Creatinine, Ser: 0.82 mg/dL (ref 0.50–1.35)
GFR calc Af Amer: >90 mL/min (ref >90)
GFR calc non Af Amer: 89 mL/min — ABNORMAL LOW (ref >90)
Glucose, Bld: 139 mg/dL — ABNORMAL HIGH (ref 70–99)
Potassium: 3.5 mmol/L (ref 3.5–5.1)
Sodium: 133 mmol/L — ABNORMAL LOW (ref 135–145)

## 2014-10-08 LAB — MAGNESIUM: Magnesium: 1.9 mg/dL (ref 1.5–2.5)

## 2014-10-08 LAB — POCT I-STAT 3, ART BLOOD GAS (G3+)
Acid-base deficit: 1 mmol/L (ref 0.0–2.0)
BICARBONATE: 23.8 meq/L (ref 20.0–24.0)
O2 SAT: 99 %
PCO2 ART: 39 mmHg (ref 35.0–45.0)
PO2 ART: 128 mmHg — AB (ref 80.0–100.0)
Patient temperature: 98.4
TCO2: 25 mmol/L (ref 0–100)
pH, Arterial: 7.393 (ref 7.350–7.450)

## 2014-10-08 LAB — GLUCOSE, CAPILLARY
GLUCOSE-CAPILLARY: 123 mg/dL — AB (ref 70–99)
Glucose-Capillary: 104 mg/dL — ABNORMAL HIGH (ref 70–99)
Glucose-Capillary: 119 mg/dL — ABNORMAL HIGH (ref 70–99)
Glucose-Capillary: 123 mg/dL — ABNORMAL HIGH (ref 70–99)
Glucose-Capillary: 134 mg/dL — ABNORMAL HIGH (ref 70–99)

## 2014-10-08 LAB — CBC
HEMATOCRIT: 23.8 % — AB (ref 39.0–52.0)
Hemoglobin: 7.9 g/dL — ABNORMAL LOW (ref 13.0–17.0)
MCH: 30.7 pg (ref 26.0–34.0)
MCHC: 33.2 g/dL (ref 30.0–36.0)
MCV: 92.6 fL (ref 78.0–100.0)
Platelets: 121 10*3/uL — ABNORMAL LOW (ref 150–400)
RBC: 2.57 MIL/uL — AB (ref 4.22–5.81)
RDW: 13.5 % (ref 11.5–15.5)
WBC: 8.5 10*3/uL (ref 4.0–10.5)

## 2014-10-08 LAB — PHOSPHORUS: PHOSPHORUS: 2.5 mg/dL (ref 2.3–4.6)

## 2014-10-08 MED ORDER — TRACE MINERALS CR-CU-F-FE-I-MN-MO-SE-ZN IV SOLN
INTRAVENOUS | Status: AC
  Administered 2014-10-08: 18:00:00 via INTRAVENOUS
  Filled 2014-10-08: qty 960

## 2014-10-08 MED ORDER — POTASSIUM CHLORIDE 10 MEQ/50ML IV SOLN
10.0000 meq | INTRAVENOUS | Status: AC
  Administered 2014-10-08 (×2): 10 meq via INTRAVENOUS
  Filled 2014-10-08 (×2): qty 50

## 2014-10-08 MED ORDER — METOPROLOL TARTRATE 1 MG/ML IV SOLN
2.5000 mg | INTRAVENOUS | Status: DC | PRN
  Administered 2014-10-08: 5 mg via INTRAVENOUS
  Administered 2014-10-08: 2.5 mg via INTRAVENOUS
  Filled 2014-10-08 (×2): qty 5

## 2014-10-08 MED ORDER — SODIUM CHLORIDE 0.9 % IV BOLUS (SEPSIS)
500.0000 mL | Freq: Once | INTRAVENOUS | Status: AC
  Administered 2014-10-08: 500 mL via INTRAVENOUS

## 2014-10-08 MED ORDER — INSULIN ASPART 100 UNIT/ML ~~LOC~~ SOLN
0.0000 [IU] | SUBCUTANEOUS | Status: DC
  Administered 2014-10-08 – 2014-10-09 (×7): 1 [IU] via SUBCUTANEOUS
  Administered 2014-10-10: 2 [IU] via SUBCUTANEOUS
  Administered 2014-10-10: 1 [IU] via SUBCUTANEOUS
  Administered 2014-10-10: 3 [IU] via SUBCUTANEOUS
  Administered 2014-10-10: 2 [IU] via SUBCUTANEOUS
  Administered 2014-10-10 (×2): 1 [IU] via SUBCUTANEOUS
  Administered 2014-10-11: 2 [IU] via SUBCUTANEOUS
  Administered 2014-10-11 (×3): 1 [IU] via SUBCUTANEOUS
  Administered 2014-10-11: 2 [IU] via SUBCUTANEOUS
  Administered 2014-10-11 – 2014-10-13 (×10): 1 [IU] via SUBCUTANEOUS
  Administered 2014-10-14: 2 [IU] via SUBCUTANEOUS
  Administered 2014-10-14: 1 [IU] via SUBCUTANEOUS
  Administered 2014-10-14: 2 [IU] via SUBCUTANEOUS
  Administered 2014-10-14: 1 [IU] via SUBCUTANEOUS

## 2014-10-08 MED ORDER — IPRATROPIUM-ALBUTEROL 0.5-2.5 (3) MG/3ML IN SOLN
3.0000 mL | Freq: Four times a day (QID) | RESPIRATORY_TRACT | Status: DC
  Administered 2014-10-08 – 2014-10-12 (×16): 3 mL via RESPIRATORY_TRACT
  Filled 2014-10-08 (×18): qty 3

## 2014-10-08 MED ORDER — FENTANYL CITRATE (PF) 100 MCG/2ML IJ SOLN
INTRAMUSCULAR | Status: AC
  Filled 2014-10-08: qty 2

## 2014-10-08 MED ORDER — FENTANYL CITRATE (PF) 100 MCG/2ML IJ SOLN
25.0000 ug | INTRAMUSCULAR | Status: DC | PRN
  Administered 2014-10-08 (×2): 25 ug via INTRAVENOUS
  Administered 2014-10-08 – 2014-10-12 (×29): 50 ug via INTRAVENOUS
  Filled 2014-10-08 (×30): qty 2

## 2014-10-08 MED ORDER — DEXTROSE-NACL 5-0.9 % IV SOLN
INTRAVENOUS | Status: DC
  Administered 2014-10-08: 75 mL/h via INTRAVENOUS
  Administered 2014-10-09 – 2014-10-13 (×2): via INTRAVENOUS

## 2014-10-08 NOTE — Progress Notes (Signed)
INITIAL NUTRITION ASSESSMENT  DOCUMENTATION CODES Per approved criteria  -Severe malnutrition in the context of chronic illness -underweight   INTERVENTION: -TPN per pharmacy  -Would recommend switching to tube feeds, unless enteral feeding contraindicated  NUTRITION DIAGNOSIS: Inadequate oral intake related to inability to eat as evidenced by NPO status  Goal: TPN meet >/= 90% of their estimated nutrition needs   Monitor:  TPN to meet needs, wound status, vent status, i/os,   Reason for Assessment: Consult for new TPN/TNA  69 y.o. male  Admitting Dx: GIB (gastrointestinal bleeding)  ASSESSMENT: [name] is a 69 y.o. male who had presented with rectal bleeding. Masses found in cecum/rectum s/p Subtotal colectomy with ileorectal anastomosis. 4/28 Developed acute resp distress and intubated. Found to have PNA.  4/30 extubated/consultation for TPN d/t bowel rest and likelihood of prolonged npo status. Last recorded intake on 4/26, but it was poor. Last complete meal 4/24.  Significant NG ouyput -400 ml  Spoke with nurse. She stated Dr. Rosendo Gros had mentioned that pt would likely not be eating for some time.   Preferred route of nutrition is enteral if gut is viable.   Nutrition Focused Physical Exam:  Subcutaneous Fat:  Orbital Region: n/a Upper Arm Region: Severe per (4/26)  Thoracic and Lumbar Region:Severe per (4/26)  Muscle:  Temple Region: moderate muscle loss Clavicle Bone Region: moderate loss Clavicle and Acromion Bone Region: moderate muscle loss Scapular Bone Region: n/a Dorsal Hand: age appropriate Patellar Region: severe muscle loss Anterior Thigh Region: Severe muscle loss Posterior Calf Region: Severe muscle loss  Edema: none  Height: Ht Readings from Last 1 Encounters:  09/30/14 5\' 9"  (1.753 m)    Weight: Wt Readings from Last 1 Encounters:  10/06/14 106 lb 13.1 oz (48.452 kg)    Ideal Body Weight: 160 lbs  % Ideal Body Weight:  66%  Wt Readings from Last 10 Encounters:  10/06/14 106 lb 13.1 oz (48.452 kg)  05/31/13 110 lb 6.4 oz (50.077 kg)  12/24/06 94 lb 0.8 oz (42.661 kg)    BMI:  Body mass index is 15.77 kg/(m^2).  Estimated Nutritional Needs: Kcal: 1700-1950 kcal (35-40 kcal/kg) Protein: >97 g Pro (2g/kg) Fluid: 1.4 liters  Skin: Surgical wounds, abrasions, dry/cracking skin  Diet Order:  NPO  EDUCATION NEEDS: -No education needs identified at this time   Intake/Output Summary (Last 24 hours) at 10/08/14 0930 Last data filed at 10/08/14 0800  Gross per 24 hour  Intake 2142.7 ml  Output   1330 ml  Net  812.7 ml    Last BM:4/26   Labs:   Recent Labs Lab 10/06/14 2343 10/07/14 0350 10/08/14 0424  NA 134* 134* 133*  K 4.2 4.1 3.5  CL 99 100 100  CO2 25 25 25   BUN 15 15 18   CREATININE 1.02 0.95 0.82  CALCIUM 7.9* 7.9* 7.7*  MG 1.4*  --  1.9  PHOS  --   --  2.5  GLUCOSE 248* 275* 139*    CBG (last 3)   Recent Labs  10/05/14 0952 10/06/14 0659  GLUCAP 109* 112*    Scheduled Meds: . antiseptic oral rinse  7 mL Mouth Rinse QID  . budesonide  0.25 mg Nebulization Q12H  . calcium-vitamin D  1 tablet Per Tube Daily  . chlorhexidine  15 mL Mouth Rinse BID  . famotidine (PEPCID) IV  20 mg Intravenous Q12H  . heparin subcutaneous  5,000 Units Subcutaneous 3 times per day  . insulin aspart  0-9  Units Subcutaneous 6 times per day  . ipratropium-albuterol  3 mL Nebulization QID  . piperacillin-tazobactam (ZOSYN)  IV  3.375 g Intravenous 3 times per day  . potassium chloride  10 mEq Intravenous Q1 Hr x 2  . sodium chloride  500 mL Intravenous Once  . sodium chloride  3 mL Intravenous Q12H    Continuous Infusions: . amiodarone 30 mg/hr (10/08/14 0600)  . dextrose 5 % and 0.9% NaCl 75 mL/hr (10/08/14 0820)  . fentaNYL infusion INTRAVENOUS 100 mcg/hr (10/08/14 0600)  . propofol (DIPRIVAN) infusion 20 mcg/kg/min (10/08/14 0600)    Past Medical History  Diagnosis Date  .  Depression   . Hypomagnesemia   . Dyspepsia   . Fall at nursing home 05/27/2013    "slipped on water; said I broke my right hip" (05/27/2013)  . Migraine     "used to have them bad; haven't had one in awhile" (05/27/2013)  . Gout attack     "not too long ago; in my right foot" (05/27/2013)  . Complete heart block     reason for pacemaker/notes 05/27/2013  . Atrial fibrillation     [name] 05/27/2013  . HLD (hyperlipidemia)   . GIB (gastrointestinal bleeding)   . BPH (benign prostatic hyperplasia)     Past Surgical History  Procedure Laterality Date  . Insert / replace / remove pacemaker    . Appendectomy    . Cholecystectomy    . Open reduction of hip Right 05/28/2013    Procedure: OPEN REDUCTION OF HIP;  Surgeon: Renette Butters, MD;  Location: [facility];  Service: Orthopedics;  Laterality: Right;  . Esophagogastroduodenoscopy (egd) with propofol N/A 09/26/2014    Procedure: ESOPHAGOGASTRODUODENOSCOPY (EGD) WITH PROPOFOL;  Surgeon: Jerene Bears, MD;  Location: [facility] ENDOSCOPY;  Service: Endoscopy;  Laterality: N/A;  . Colonoscopy N/A 10/01/2014    Procedure: COLONOSCOPY;  Surgeon: Jerene Bears, MD;  Location: [facility] ENDOSCOPY;  Service: Endoscopy;  Laterality: N/A;  . Partial colectomy N/A 10/05/2014    Procedure: SUBTOTAL COLECTOMY WITH ILEORECTAL ANASTOMOSIS;  Surgeon: Donnie Mesa, MD;  Location: [facility];  Service: General;  Laterality: N/A;    Burtis Junes RD, LDN Nutrition Pager: [phone] 10/08/2014 9:30 AM

## 2014-10-08 NOTE — Progress Notes (Deleted)
Foley catheter d/c'd at 0010. Pt unable to void and does not feel the urge to void. Bladder scan showed 130-155 cc. Will monitor for void for 2 more hours per protocol.  Jessie Jai Steil, RN 

## 2014-10-08 NOTE — Progress Notes (Signed)
PULMONARY / CRITICAL CARE MEDICINE   Name: [name] MRN: [id number] DOB: 1946-01-15    ADMISSION DATE:  09/29/2014 CONSULTATION DATE:  4/28   REFERRING MD :  Allyson Sabal   CHIEF COMPLAINT:  Acute respiratory failure   INITIAL PRESENTATION:  69yo male smoker with hx AFib on xarelto, gout, depression, hx GI bleed.  Recently admitted from 4/16 to 4/18 because of GI bleeding. He had EGD on 4/18, which was negative for active bleeding. Ultimately found to have cecal wall and rectal mass on f/u colonoscopy.  Returned 4/21 with rectal bleeding and ultimately underwent subtotal colectomy 4/27.  Was doing well post op until 4/28 when he developed acute respiratory distress, hypoxia with sats 60% despite NRB and PCCM called for ICU tx and emergent intubation.    STUDIES:  CT abd 4/22>>>Heterogeneous collection measuring 1.8 x 4.6 cm over the posterior right mid abdomen immediately posterior to several small bowel loops and inferior to the right kidney. This likely represents a small hemorrhagic collection with etiology uncertain. No evidence of free peritoneal air or bowel obstruction.  SIGNIFICANT EVENTS: 4/27 OR>> subtotal colectomy  4/28> respiratory failure, tx ICU, intubation  4/29 A fib / flutter o/n, started on amio  SUBJECTIVE:  Afebrile Calm , RASS 0 on fent + propofol gtt C/o mild abd pain   VITAL SIGNS: Temp:  [98.4 F (36.9 C)-99.7 F (37.6 C)] 98.4 F (36.9 C) (04/30 0743) Pulse Rate:  [32-136] 92 (04/30 0500) Resp:  [21-38] 21 (04/30 0700) BP: (91-146)/(51-89) 108/57 mmHg (04/30 0700) SpO2:  [90 %-100 %] 96 % (04/30 0700) Arterial Line BP: (100-148)/(44-66) 125/54 mmHg (04/30 0700) FiO2 (%):  [40 %-50 %] 40 % (04/30 0300) HEMODYNAMICS: CVP:  [2 mmHg-8 mmHg] 3 mmHg VENTILATOR SETTINGS: Vent Mode:  [-] PRVC FiO2 (%):  [40 %-50 %] 40 % Set Rate:  [26 bmp-28 bmp] 26 bmp Vt Set:  [520 mL] 520 mL PEEP:  [5 cmH20] 5 cmH20 Plateau Pressure:  [17 cmH20-18 cmH20] 18  cmH20 INTAKE / OUTPUT:  Intake/Output Summary (Last 24 hours) at 10/08/14 0823 Last data filed at 10/08/14 1696  Gross per 24 hour  Intake 2246.9 ml  Output   1365 ml  Net  881.9 ml    PHYSICAL EXAMINATION: General:  69 year old male, sedated and intubated Neuro:  Awake, alert, anxious, moves all ext  HEENT:  [place], edentulous.  Cardiovascular:  irreg w/ AF Lungs:  Basilar rales, + accessory muscle use  Abdomen:  Mid-abd dressing intact, hypo-active. NGT now in place. Bilious drainage  Musculoskeletal:  Intact  Skin:  Intact   LABS:  CBC  Recent Labs Lab 10/06/14 0356 10/07/14 0350 10/08/14 0424  WBC 8.2 12.3* 8.5  HGB 9.1* 9.9* 7.9*  HCT 28.2* 29.3* 23.8*  PLT 111* 152 121*   Coag's No results for input(s): APTT, INR in the last 168 hours. BMET  Recent Labs Lab 10/06/14 2343 10/07/14 0350 10/08/14 0424  NA 134* 134* 133*  K 4.2 4.1 3.5  CL 99 100 100  CO2 25 25 25   BUN 15 15 18   CREATININE 1.02 0.95 0.82  GLUCOSE 248* 275* 139*   Electrolytes  Recent Labs Lab 10/06/14 2343 10/07/14 0350 10/08/14 0424  CALCIUM 7.9* 7.9* 7.7*  MG 1.4*  --  1.9  PHOS  --   --  2.5   Sepsis Markers No results for input(s): LATICACIDVEN, PROCALCITON, O2SATVEN in the last 168 hours. ABG  Recent Labs Lab 10/06/14 1619 10/06/14 2043 10/07/14  0325  PHART 7.390 7.420 7.413  PCO2ART 46.0* 44.4 38.3  PO2ART 104.0* 374.0* 235*   Liver Enzymes  Recent Labs Lab 10/06/14 0356 10/07/14 0350  AST 15 22  ALT 12 15  ALKPHOS 62 54  BILITOT 0.4 0.5  ALBUMIN 2.3* 1.9*   Cardiac Enzymes No results for input(s): TROPONINI, PROBNP in the last 168 hours. Glucose  Recent Labs Lab 10/02/14 0545 10/03/14 0534 10/04/14 0636 10/05/14 0659 10/05/14 0952 10/06/14 0659  GLUCAP 136* 102* 102* 116* 109* 112*    Imaging Dg Chest Port 1 View  10/07/2014   CLINICAL DATA:  Respiratory failure .  EXAM: PORTABLE CHEST - 1 VIEW  COMPARISON:  10/06/2014.  FINDINGS:  Endotracheal tube, left IJ line, NG tube ends stable position. Mediastinum and hilar structures are normal. Heart size stable. Cardiac pacer in stable position. Developing diffuse left lower lobe infiltrate. Pneumonia could present in this fashion. Aspiration cannot be excluded. No pleural effusion or pneumothorax.  IMPRESSION: 1. Lines and tubes in stable position. 2. Developing prominent left lower lobe infiltrate consistent with pneumonia. Aspiration cannot be excluded.   Electronically Signed   By: Marcello Moores  Register   On: 10/07/2014 07:38     ASSESSMENT / PLAN:  PULMONARY OETT 4/28>>> Acute hypoxic respiratory failure - peep responsive hypoxia  Doubt ARDS - feel hypoxia was autoPEEP related LLL PNA, ? aspiration P:   Off  ARDS protocol 4/29 SBTs with goal extubation BD's    CARDIOVASCULAR CVL L IJ CVL 4/28>>> R rad aline 4/28>>> Shock  Hx HTN  AFib with RVR (chronic)  CHADS-VASc =2c P:  Chronic BB  Cont hold anticoagulation post op  Hold coreg, cardura, diltiazem for now given no enteral feeds / meds pressors off -start lopressor prn  Continue amiodarone gtt  RENAL Oliguria - low CVP Hyopnatremia/ hypokalemia P:   Monitor chem  NS x 500cc, then D5NS @ 75/h   GASTROINTESTINAL Rectal bleeding due to Rectal mass Cecal mass S/p subtotal colectomy 4/27 Retroperitoneal bleed - small.   Protein calorie malnutrition P:    Surgery following  NPO, start trickle TF when OK w CCS, otherwise YNA OG to suction  Famotidine   HEMATOLOGIC Anemia - mild  Thrombocytopenia  P:  Sq heparin  Hold full anticoagulation (A Fib) for now  F/u CBC   INFECTIOUS HCAP v aspiration PNA LLL  P:   Sputum 4/28>>> Zosyn 4/28>>>  ENDOCRINE No active issue  P:   Monitor glucose while npo NEUROLOGIC AMS - r/t severe respiratory failure, shock Paralysis initiated due to vent dyssynchrony  P:   RASS goal: 0    FAMILY  - Updates:  No family; he is a nursing home resident,  reportedly with no family or representative.   The patient is critically ill with multiple organ systems failure and requires high complexity decision making for assessment and support, frequent evaluation and titration of therapies, application of advanced monitoring technologies and extensive interpretation of multiple databases. Critical Care Time devoted to patient care services described in this note independent of APP time is 35 minutes.    Kara Mead MD. Shade Flood. [facility] Pager [phone] If no response call [phone]   10/08/2014, 8:23 AM

## 2014-10-08 NOTE — Progress Notes (Signed)
eLink Physician-Brief Progress Note Patient Name: RUSHTON EARLY DOB: March 24, 1946 MRN: [id number]   Date of Service  10/08/2014  HPI/Events of Note  Decubitus film of abdomen remains suspicious for free intraperitoneal air.   eICU Interventions  Will order Abdominal CT Scan with oral and IV contrast.      Intervention Category Intermediate Interventions: Diagnostic test evaluation  Salsabeel Gorelick Eugene 10/08/2014, 10:57 PM

## 2014-10-08 NOTE — Progress Notes (Signed)
PARENTERAL NUTRITION CONSULT NOTE - INITIAL  Pharmacy Consult for TPN Indication: NPO, PCM  No Known Allergies  Patient Measurements: Height: 5\' 9"  (175.3 cm) Weight: 106 lb 13.1 oz (48.452 kg) IBW/kg (Calculated) : 70.7  Intake/Output from previous day: 04/29 0701 - 04/30 0700 In: 2406.7 [I.V.:1526.7; NG/GT:30; IV Piggyback:250] Out: 1390 [Urine:590; Emesis/NG output:800] Intake/Output from this shift: Total I/O In: 20 [NG/GT:20] Out: -   Labs:  Recent Labs  10/06/14 0356 10/07/14 0350 10/08/14 0424  WBC 8.2 12.3* 8.5  HGB 9.1* 9.9* 7.9*  HCT 28.2* 29.3* 23.8*  PLT 111* 152 121*     Recent Labs  10/06/14 0356 10/06/14 1612 10/06/14 2343 10/07/14 0350 10/08/14 0424  NA 135  --  134* 134* 133*  K 3.9  --  4.2 4.1 3.5  CL 99  --  99 100 100  CO2 28  --  25 25 25   GLUCOSE 118*  --  248* 275* 139*  BUN 11  --  15 15 18   CREATININE 0.60  --  1.02 0.95 0.82  CALCIUM 7.9*  --  7.9* 7.9* 7.7*  MG  --   --  1.4*  --  1.9  PHOS  --   --   --   --  2.5  PROT 5.0*  --   --  4.9*  --   ALBUMIN 2.3*  --   --  1.9*  --   AST 15  --   --  22  --   ALT 12  --   --  15  --   ALKPHOS 62  --   --  54  --   BILITOT 0.4  --   --  0.5  --   TRIG  --  83  --   --   --    Estimated Creatinine Clearance: 60 mL/min (by C-G formula based on Cr of 0.82).    Recent Labs  10/05/14 0952 10/06/14 0659  GLUCAP 109* 112*    Medical History: Past Medical History  Diagnosis Date  . Depression   . Hypomagnesemia   . Dyspepsia   . Fall at nursing home 05/27/2013    "slipped on water; said I broke my right hip" (05/27/2013)  . Migraine     "used to have them bad; haven't had one in awhile" (05/27/2013)  . Gout attack     "not too long ago; in my right foot" (05/27/2013)  . Complete heart block     reason for pacemaker/notes 05/27/2013  . Atrial fibrillation     [name] 05/27/2013  . HLD (hyperlipidemia)   . GIB (gastrointestinal bleeding)   . BPH (benign prostatic  hyperplasia)     Insulin Requirements in the past 24 hours:  none Surgeries/Procedures: 4/27  Subtotal colectomy with ileorectal anastomosis GI:  NPO   Alb 1.9 Endo: am glucose 139, will add ssi with initiation of TPN Lytes: Na 133 K 3.5 (goal >4 with afib) Phos 2.5, Mag 1.9 (goal >2 ), Ca 7.7, CoCa 9.38  MD ordered KCl 10 mEq IV X 2 Renal: SrCr 0.82, UOP 0.5 ml/kg/hr Pulm: became hypoxic and transferred to ICU, now on vent, ? Asp PNA Cards:afib, BP soft, HR ok  On amio drip Hepatobil: LFTs ok, TG 83 Neuro:  Sedated on fent and propofol ID:  Zosyn  4/28>>     WBC 8.5, Afeb TPN Access: CVC LIJ 4/28 TPN start date:  4/30  Current Nutrition:  NPO   Propofol at 5.8  ml/hr provides ~153 Kcal/day IVF D5NS @75  ml/hr Assessment: 69 yo man s/p Subtotal colectomy with ileorectal anastomosis for cecal wall mass and rectal mass to start TPN for NPO status and PCM.  Nutritional Goals:  Per RD assessment  Plan:  Start Clinimix E 5/15 @ 40 ml/hr Will not start lipids at this time Start ssi with initiation of TPN F/u am labs, RD recommendations Dec IVF to 35 ml/hr when TPN starts  Audray Rumore Poteet 10/08/2014,8:55 AM

## 2014-10-08 NOTE — Progress Notes (Signed)
Wasted 50 mls of IV infusion  Fentanyl in sink.  Maia Petties, RN was a witness. Bobette Mo

## 2014-10-08 NOTE — Progress Notes (Signed)
CRITICAL VALUE ALERT  Critical value received: possible abd free air from abd X-ray  Date of notification:  10/08/14  Time of notification:  2239  Critical value read back:Yes.    Nurse who received alert:  Wyline Beady, RN  MD notified (1st page):  Emily Filbert  Time of first page:  2240  MD notified (2nd page):  Time of second page:  Responding FM:MCRFV Sommers  Time MD responded:  2250

## 2014-10-08 NOTE — Progress Notes (Signed)
3 Days Post-Op  Subjective: Pt with no acute changes overnight  Objective: Vital signs in last 24 hours: Temp:  [98.4 F (36.9 C)-99.7 F (37.6 C)] 98.4 F (36.9 C) (04/30 0743) Pulse Rate:  [32-136] 92 (04/30 0500) Resp:  [21-38] 21 (04/30 0700) BP: (91-146)/(51-89) 108/57 mmHg (04/30 0700) SpO2:  [90 %-100 %] 96 % (04/30 0700) Arterial Line BP: (100-148)/(44-66) 125/54 mmHg (04/30 0700) FiO2 (%):  [40 %-50 %] 40 % (04/30 0300) Last BM Date: 10/04/14  Intake/Output from previous day: 04/29 0701 - 04/30 0700 In: 2406.7 [I.V.:1526.7; NG/GT:30; IV Piggyback:250] Out: 1390 [Urine:590; Emesis/NG output:800] Intake/Output this shift: Total I/O In: 20 [NG/GT:20] Out: -   General appearance: alert GI: soft, approp ttp, ND, wound c/d/i  Lab Results:   Recent Labs  10/07/14 0350 10/08/14 0424  WBC 12.3* 8.5  HGB 9.9* 7.9*  HCT 29.3* 23.8*  PLT 152 121*   BMET  Recent Labs  10/07/14 0350 10/08/14 0424  NA 134* 133*  K 4.1 3.5  CL 100 100  CO2 25 25  GLUCOSE 275* 139*  BUN 15 18  CREATININE 0.95 0.82  CALCIUM 7.9* 7.7*   PT/INR No results for input(s): LABPROT, INR in the last 72 hours. ABG  Recent Labs  10/06/14 2043 10/07/14 0325  PHART 7.420 7.413  HCO3 28.8* 24.0    Studies/Results: Dg Chest Port 1 View  10/08/2014   CLINICAL DATA:  Hypoxia  EXAM: PORTABLE CHEST - 1 VIEW  COMPARISON:  October 07, 2014  FINDINGS: Endotracheal tube tip is 3.0 cm above the carina. Central catheter tip is in the superior vena cava. Nasogastric tube tip and side port are in the stomach. No pneumothorax. Pacemaker leads are attached to the right atrium right ventricle. Heart size and pulmonary vascularity are normal.  There is persistent patchy infiltrate in the left lower lobe. Lungs are otherwise clear. No adenopathy.  IMPRESSION: Tube and catheter positions as described without pneumothorax. Persistent left lower lobe infiltrate without appreciable change. No new opacity. No  change in cardiac silhouette.   Electronically Signed   By: Lowella Grip III M.D.   On: 10/08/2014 07:23   Dg Chest Port 1 View  10/07/2014   CLINICAL DATA:  Respiratory failure .  EXAM: PORTABLE CHEST - 1 VIEW  COMPARISON:  10/06/2014.  FINDINGS: Endotracheal tube, left IJ line, NG tube ends stable position. Mediastinum and hilar structures are normal. Heart size stable. Cardiac pacer in stable position. Developing diffuse left lower lobe infiltrate. Pneumonia could present in this fashion. Aspiration cannot be excluded. No pleural effusion or pneumothorax.  IMPRESSION: 1. Lines and tubes in stable position. 2. Developing prominent left lower lobe infiltrate consistent with pneumonia. Aspiration cannot be excluded.   Electronically Signed   By: Marcello Moores  Register   On: 10/07/2014 07:38   Dg Chest Port 1 View  10/06/2014   CLINICAL DATA:  Encounter for central line placement.  EXAM: PORTABLE CHEST - 1 VIEW  COMPARISON:  Same day.  FINDINGS: The heart size and mediastinal contours are within normal limits. Both lungs are clear. No pneumothorax or pleural effusion is noted. Endotracheal tube is in grossly good position with distal tip 6.4 cm above the carina. Left internal jugular catheter is noted with distal tip in expected position of SVC The visualized skeletal structures are unremarkable.  IMPRESSION: No pneumothorax status post left internal jugular catheter placement. Endotracheal tube in grossly good position.   Electronically Signed   By: Marijo Conception, M.D.  On: 10/06/2014 15:51   Dg Chest Port 1 View  10/06/2014   CLINICAL DATA:  Shortness of breath.  Decreased oxygen saturation.  EXAM: PORTABLE CHEST - 1 VIEW  COMPARISON:  Single view of the chest 05/27/2013. PA and lateral chest 09/21/2014.  FINDINGS: Pacing device in place. Heart size is upper normal. There is no pulmonary edema. Lungs are clear. No pneumothorax or pleural effusion.  IMPRESSION: No acute disease.   Electronically Signed    By: Inge Rise M.D.   On: 10/06/2014 14:15    Anti-infectives: Anti-infectives    Start     Dose/Rate Route Frequency Ordered Stop   10/06/14 1445  piperacillin-tazobactam (ZOSYN) IVPB 3.375 g     3.375 g 12.5 mL/hr over 240 Minutes Intravenous 3 times per day 10/06/14 1443     10/05/14 1117  cefoTEtan in Dextrose 5% (CEFOTAN) 2-2.08 GM-% IVPB    Comments:  Ronnald Ramp, [place]   : cabinet override      10/05/14 1117 10/05/14 2329   10/05/14 1045  cefoTEtan (CEFOTAN) 2 g in dextrose 5 % 50 mL IVPB     2 g 100 mL/hr over 30 Minutes Intravenous 30 min pre-op 10/04/14 1548 10/05/14 1140      Assessment/Plan: s/p Procedure(s): SUBTOTAL COLECTOMY WITH ILEORECTAL ANASTOMOSIS (N/A) Pharm Consult for TNA-poor nutrition pre op Per CCM working to extubation today  Con't Abx   LOS: 9 days    Rosario Jacks., Anne Hahn 10/08/2014

## 2014-10-08 NOTE — Progress Notes (Signed)
eLink Physician-Brief Progress Note Patient Name: [name] DOB: 11/15/1945 MRN: [id number]   Date of Service  10/08/2014  HPI/Events of Note  Question of L free air in abdomen on CXR.   eICU Interventions  Will order L lateral abdominal decubitus film.      Intervention Category Minor Interventions: Clinical assessment - ordering diagnostic tests  Sommer,Steven Cornelia Copa 10/08/2014, 7:01 PM

## 2014-10-08 NOTE — Progress Notes (Signed)
Spoke with Critical Care Dr. Titus Mould regarding orders for CT abdomen with contrast order placed by Dr Oletta Darter, advised by Dr. Harland Dingwall to discuss with CCS for clarification of orders and to make them aware of X-ray findings. Spoke with Dr. Rosendo Gros of [facility] and orders received to cancel CT abdomen as intraperitoneal air would be a probable expected finding 3 days post-op. Pt stable, complaining of mild abdominal pain (no worse than before). VSS. Will continue to monitor closely and alert physician of any clinical change.

## 2014-10-08 NOTE — Procedures (Signed)
Extubation Procedure Note  Patient Details:   Name: [name] DOB: 02/19/1946 MRN: [id number]   Airway Documentation:     Evaluation  O2 sats: stable throughout Complications: No apparent complications Patient did tolerate procedure well. Bilateral Breath Sounds: Diminished, Rhonchi, Coarse crackles Suctioning: Airway, Oral Yes   Pt tolerated wean, positive for cuff leak, extubated to 6L . No stridor or dyspnea noted after extubation. RT will continue to monitor.   Mariam Dollar 10/08/2014, 12:20 PM

## 2014-10-09 ENCOUNTER — Inpatient Hospital Stay (HOSPITAL_COMMUNITY): Payer: Medicare Other

## 2014-10-09 DIAGNOSIS — E43 Unspecified severe protein-calorie malnutrition: Secondary | ICD-10-CM

## 2014-10-09 LAB — COMPREHENSIVE METABOLIC PANEL
ALBUMIN: 1.6 g/dL — AB (ref 3.5–5.0)
ALT: 15 U/L — ABNORMAL LOW (ref 17–63)
AST: 14 U/L — AB (ref 15–41)
Alkaline Phosphatase: 55 U/L (ref 38–126)
Anion gap: 9 (ref 5–15)
BUN: 12 mg/dL (ref 6–20)
CO2: 27 mmol/L (ref 22–32)
CREATININE: 0.65 mg/dL (ref 0.61–1.24)
Calcium: 7.7 mg/dL — ABNORMAL LOW (ref 8.9–10.3)
Chloride: 101 mmol/L (ref 101–111)
GFR calc Af Amer: >60 mL/min (ref >60)
GFR calc non Af Amer: >60 mL/min (ref >60)
GLUCOSE: 135 mg/dL — AB (ref 70–99)
Potassium: 3.2 mmol/L — ABNORMAL LOW (ref 3.5–5.1)
SODIUM: 137 mmol/L (ref 135–145)
Total Bilirubin: 0.4 mg/dL (ref 0.3–1.2)
Total Protein: 4.8 g/dL — ABNORMAL LOW (ref 6.5–8.1)

## 2014-10-09 LAB — GLUCOSE, CAPILLARY
GLUCOSE-CAPILLARY: 139 mg/dL — AB (ref 70–99)
GLUCOSE-CAPILLARY: 128 mg/dL — AB (ref 70–99)
GLUCOSE-CAPILLARY: 123 mg/dL — AB (ref 70–99)
Glucose-Capillary: 119 mg/dL — ABNORMAL HIGH (ref 70–99)
Glucose-Capillary: 140 mg/dL — ABNORMAL HIGH (ref 70–99)

## 2014-10-09 LAB — MAGNESIUM: Magnesium: 1.8 mg/dL (ref 1.7–2.4)

## 2014-10-09 LAB — PREALBUMIN: Prealbumin: 3.7 mg/dL — ABNORMAL LOW (ref 18–38)

## 2014-10-09 LAB — CBC
HCT: 20.9 % — ABNORMAL LOW (ref 39.0–52.0)
HEMOGLOBIN: 6.9 g/dL — AB (ref 13.0–17.0)
MCH: 30.8 pg (ref 26.0–34.0)
MCHC: 33 g/dL (ref 30.0–36.0)
MCV: 93.3 fL (ref 78.0–100.0)
Platelets: 105 10*3/uL — ABNORMAL LOW (ref 150–400)
RBC: 2.24 MIL/uL — ABNORMAL LOW (ref 4.22–5.81)
RDW: 13.6 % (ref 11.5–15.5)
WBC: 6.3 10*3/uL (ref 4.0–10.5)

## 2014-10-09 LAB — PHOSPHORUS: PHOSPHORUS: 3.3 mg/dL (ref 2.5–4.6)

## 2014-10-09 LAB — TRIGLYCERIDES: Triglycerides: 78 mg/dL (ref <150)

## 2014-10-09 LAB — PREPARE RBC (CROSSMATCH)

## 2014-10-09 MED ORDER — POTASSIUM CHLORIDE 20 MEQ/15ML (10%) PO SOLN: 30.0000 meq | ORAL | Status: DC

## 2014-10-09 MED ORDER — TRACE MINERALS CR-CU-F-FE-I-MN-MO-SE-ZN IV SOLN
INTRAVENOUS | Status: AC
  Administered 2014-10-09: 18:00:00 via INTRAVENOUS
  Filled 2014-10-09: qty 1440

## 2014-10-09 MED ORDER — SODIUM CHLORIDE 0.9 % IJ SOLN
10.0000 mL | INTRAMUSCULAR | Status: DC | PRN
  Administered 2014-10-13: 10 mL
  Administered 2014-10-14: 20 mL
  Administered 2014-10-15: 10 mL
  Filled 2014-10-09 (×3): qty 40

## 2014-10-09 MED ORDER — TRACE MINERALS CR-CU-F-FE-I-MN-MO-SE-ZN IV SOLN: INTRAVENOUS | Status: DC

## 2014-10-09 MED ORDER — FAT EMULSION 20 % IV EMUL: 240.0000 mL | INTRAVENOUS | Status: DC

## 2014-10-09 MED ORDER — SODIUM CHLORIDE 0.9 % IV SOLN: Freq: Once | INTRAVENOUS | Status: DC

## 2014-10-09 MED ORDER — POTASSIUM CHLORIDE 10 MEQ/50ML IV SOLN
10.0000 meq | INTRAVENOUS | Status: AC
  Administered 2014-10-09 (×4): 10 meq via INTRAVENOUS
  Filled 2014-10-09 (×4): qty 50

## 2014-10-09 MED ORDER — CARVEDILOL 6.25 MG PO TABS
6.2500 mg | ORAL_TABLET | Freq: Two times a day (BID) | ORAL | Status: DC
  Administered 2014-10-09 – 2014-10-17 (×17): 6.25 mg via ORAL
  Filled 2014-10-09 (×19): qty 1

## 2014-10-09 MED ORDER — FAT EMULSION 20 % IV EMUL
240.0000 mL | INTRAVENOUS | Status: AC
  Administered 2014-10-09: 240 mL via INTRAVENOUS
  Filled 2014-10-09: qty 250

## 2014-10-09 MED ORDER — SODIUM CHLORIDE 0.9 % IJ SOLN
10.0000 mL | Freq: Two times a day (BID) | INTRAMUSCULAR | Status: DC
  Administered 2014-10-10 – 2014-10-12 (×5): 10 mL

## 2014-10-09 NOTE — Progress Notes (Signed)
[facility] ADULT ICU REPLACEMENT PROTOCOL FOR AM LAB REPLACEMENT ONLY  The patient does apply for the [facility] Adult ICU Electrolyte Replacment Protocol based on the criteria listed below:   1. Is GFR >/= 40 ml/min? Yes.    Patient's GFR today is >60 2. Is urine output >/= 0.5 ml/kg/hr for the last 6 hours? Yes.   Patient's UOP is 2.3 ml/kg/hr 3. Is BUN < 60 mg/dL? Yes.    Patient's BUN today is 12 4. Abnormal electrolyte(s):K 3.2 5. Ordered repletion with: per protocol 6. If a panic level lab has been reported, has the CCM MD in charge been notified? No..   Physician:    Ronda Fairly A 10/09/2014 6:31 AM

## 2014-10-09 NOTE — Progress Notes (Signed)
CRITICAL VALUE ALERT  Critical value received:  Hgb 6.9  Date of notification:  10-09-14  Time of notification:  0618  Critical value read back:Yes.    Nurse who received alert:  Chalmers Cater  MD notified (1st page):  Hervey Ard  Time of first page:  [phone]

## 2014-10-09 NOTE — Progress Notes (Signed)
PULMONARY / CRITICAL CARE MEDICINE   Name: [name] MRN: [id number] DOB: August 15, 1945    ADMISSION DATE:  09/29/2014 CONSULTATION DATE:  4/28   REFERRING MD :  Allyson Sabal   CHIEF COMPLAINT:  Acute respiratory failure   INITIAL PRESENTATION:  69yo male smoker with hx AFib on xarelto, gout, depression, hx GI bleed.  Recently admitted from 4/16 to 4/18 because of GI bleeding. He had EGD on 4/18, which was negative for active bleeding. Ultimately found to have cecal wall and rectal mass on f/u colonoscopy.  Returned 4/21 with rectal bleeding and ultimately underwent subtotal colectomy 4/27.  Was doing well post op until 4/28 when he developed acute respiratory distress, hypoxia with sats 60% despite NRB and PCCM called for ICU tx and emergent intubation.    STUDIES:  CT abd 4/22>>>Heterogeneous collection measuring 1.8 x 4.6 cm over the posterior right mid abdomen immediately posterior to several small bowel loops and inferior to the right kidney. This likely represents a small hemorrhagic collection with etiology uncertain. No evidence of free peritoneal air or bowel obstruction.  SIGNIFICANT EVENTS: 4/27 OR>> subtotal colectomy  4/28> respiratory failure, tx ICU, intubation  4/29 A fib / flutter o/n, started on amio 4/30 extubated  SUBJECTIVE:  Afebrile C/o mild abd pain Does nto want to get oob UO picking up   VITAL SIGNS: Temp:  [98.5 F (36.9 C)-99.2 F (37.3 C)] 98.5 F (36.9 C) (05/01 0849) Pulse Rate:  [42-105] 74 (05/01 0900) Resp:  [6-21] 17 (05/01 0900) BP: (117-165)/(47-83) 125/51 mmHg (05/01 0900) SpO2:  [90 %-100 %] 96 % (05/01 0900) Arterial Line BP: (132-169)/(49-60) 132/60 mmHg (05/01 0000) FiO2 (%):  [40 %] 40 % (04/30 1206) Weight:  [119 lb 11.4 oz (54.3 kg)] 119 lb 11.4 oz (54.3 kg) (05/01 0500) HEMODYNAMICS: CVP:  [5 mmHg-8 mmHg] 6 mmHg VENTILATOR SETTINGS: Vent Mode:  [-] PSV FiO2 (%):  [40 %] 40 % PEEP:  [5 cmH20] 5 cmH20 Pressure Support:  [5 cmH20] 5  cmH20 INTAKE / OUTPUT:  Intake/Output Summary (Last 24 hours) at 10/09/14 0920 Last data filed at 10/09/14 0900  Gross per 24 hour  Intake 2606.51 ml  Output   2460 ml  Net 146.51 ml    PHYSICAL EXAMINATION: General:  Chronically ill appearing Neuro:  Awake, alert, anxious, non focal HEENT:  [place], edentulous.  Cardiovascular:  irreg w/ AF Lungs:decreased BS BL Abdomen:  Mid-abd dressing intact, hypo-active. NGT  in place. Bilious drainage  Musculoskeletal:  Intact  Skin:  Intact   LABS:  CBC  Recent Labs Lab 10/07/14 0350 10/08/14 0424 10/09/14 0400  WBC 12.3* 8.5 6.3  HGB 9.9* 7.9* 6.9*  HCT 29.3* 23.8* 20.9*  PLT 152 121* 105*   Coag's No results for input(s): APTT, INR in the last 168 hours. BMET  Recent Labs Lab 10/07/14 0350 10/08/14 0424 10/09/14 0400  NA 134* 133* 137  K 4.1 3.5 3.2*  CL 100 100 101  CO2 25 25 27   BUN 15 18 12   CREATININE 0.95 0.82 0.65  GLUCOSE 275* 139* 135*   Electrolytes  Recent Labs Lab 10/06/14 2343 10/07/14 0350 10/08/14 0424 10/09/14 0400  CALCIUM 7.9* 7.9* 7.7* 7.7*  MG 1.4*  --  1.9 1.8  PHOS  --   --  2.5 3.3   Sepsis Markers No results for input(s): LATICACIDVEN, PROCALCITON, O2SATVEN in the last 168 hours. ABG  Recent Labs Lab 10/06/14 2043 10/07/14 0325 10/08/14 1157  PHART 7.420 7.413 7.Biggs  44.4 38.3 39.0  PO2ART 374.0* 235* 128.0*   Liver Enzymes  Recent Labs Lab 10/06/14 0356 10/07/14 0350 10/09/14 0400  AST 15 22 14*  ALT 12 15 15*  ALKPHOS 62 54 55  BILITOT 0.4 0.5 0.4  ALBUMIN 2.3* 1.9* 1.6*   Cardiac Enzymes No results for input(s): TROPONINI, PROBNP in the last 168 hours. Glucose  Recent Labs Lab 10/08/14 1151 10/08/14 1625 10/08/14 1932 10/08/14 2331 10/09/14 0331 10/09/14 0735  GLUCAP 123* 119* 134* 104* 139* 119*    Imaging Dg Chest Port 1 View  10/08/2014   CLINICAL DATA:  Nasogastric tube placement. Atrial fibrillation. History of smoking.  EXAM:  PORTABLE CHEST - 1 VIEW  COMPARISON:  10/08/2014 and earlier  FINDINGS: Left IJ central line has been placed, tip overlying the superior vena cava. Nasogastric tube is in place, tip not well seen but beyond the gastroesophageal junction.  Right-sided transvenous pacemaker leads overlie the right atrium and right ventricle. The heart is enlarged. There is dense opacity at the left lung base consistent with infiltrate.  There is lucency below the base the heart, suspicious for pneumopericardium or pneumoperitoneum. Further evaluation with left lateral decubitus view of the abdomen is recommended.  IMPRESSION: 1. Interval placement of left IJ central line.  No pneumothorax. 2. Nasogastric tube placed, tip beyond the gastroesophageal junction. 3. Question of abnormal gas collection in the upper abdomen. See above. Recommend left lateral decubitus view of the abdomen to exclude free intraperitoneal air. 4. The salient findings were discussed with Dr. Emmit Alexanders on 10/08/2014 at 6:58 pm.   Electronically Signed   By: Nolon Nations M.D.   On: 10/08/2014 19:00   Dg Chest Port 1 View  10/08/2014   CLINICAL DATA:  Hypoxia  EXAM: PORTABLE CHEST - 1 VIEW  COMPARISON:  October 07, 2014  FINDINGS: Endotracheal tube tip is 3.0 cm above the carina. Central catheter tip is in the superior vena cava. Nasogastric tube tip and side port are in the stomach. No pneumothorax. Pacemaker leads are attached to the right atrium right ventricle. Heart size and pulmonary vascularity are normal.  There is persistent patchy infiltrate in the left lower lobe. Lungs are otherwise clear. No adenopathy.  IMPRESSION: Tube and catheter positions as described without pneumothorax. Persistent left lower lobe infiltrate without appreciable change. No new opacity. No change in cardiac silhouette.   Electronically Signed   By: Lowella Grip III M.D.   On: 10/08/2014 07:23   Dg Abd Decub  10/08/2014   CLINICAL DATA:  Evaluate for free intraperitoneal  air. Chest radiograph demonstrating possible free intraperitoneal air.  EXAM: ABDOMEN - 1 VIEW DECUBITUS  COMPARISON:  CT of 09/30/2014  FINDINGS: Right-sided decubitus view demonstrates lucency in the right upper quadrant for which trace free intraperitoneal air is suspected. Midline laparotomy. Normal small bowel caliber. Right proximal femoral fixation.  IMPRESSION: 1. Suspicion of small volume right upper quadrant free intraperitoneal air. Critical test results telephoned to Kathlee Nations, r.n. at the time of interpretation at 10:39 p.m.on 10/08/2014. Consider dedicated abdominal series including left side up decubitus imaging. 2. No bowel distension.   Electronically Signed   By: Abigail Miyamoto M.D.   On: 10/08/2014 22:41     ASSESSMENT / PLAN:  PULMONARY OETT 4/28>>> 4/30 Acute hypoxic respiratory failure - peep responsive hypoxia  Doubt ARDS - feel hypoxia was autoPEEP related LLL PNA, ? 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   Protein calorie malnutrition P:    Surgery following  NPO, start trickle TF when OK w CCS Famotidine   HEMATOLOGIC Anemia - mild  Thrombocytopenia  P:  Sq heparin  Hold full anticoagulation (A Fib) for now  F/u CBC   INFECTIOUS HCAP v aspiration PNA LLL  P:   Sputum 4/28>>> Zosyn 4/28>>>  ENDOCRINE No active issue  P:   Monitor glucose while npo  NEUROLOGIC AMS - r/t severe respiratory failure, shock Paralysis briefly due to vent dyssynchrony  P:  Mobilise,not motivated for  PT   FAMILY  - Updates:  No family; he is a nursing home resident, reportedly with no family or representative.   Summary - Has tolerated  Extubation, UO improved, has AICD, dc amio gtt & resume PO home meds when able to  The patient is critically ill with multiple organ systems failure and requires high complexity decision making for assessment and support, frequent evaluation and titration of therapies, application of advanced monitoring technologies and extensive interpretation of multiple databases. Critical Care Time devoted to patient care services described in this note independent of APP time is 35 minutes.    Kara Mead MD. Shade Flood. [facility] Pager [phone] If no response call [phone]   10/09/2014, 9:20 AM

## 2014-10-09 NOTE — Progress Notes (Signed)
[facility] Progress Note Patient Name: WAYNE WICKLUND DOB: 07-Jan-1946 MRN: [id number]   Date of Service  10/09/2014  HPI/Events of Note  hgb down, likely dilutional  eICU Interventions  Tx 1 unit     Intervention Category Minor Interventions: Routine modifications to care plan (e.g. PRN medications for pain, fever)  Raylene Miyamoto. 10/09/2014, 6:40 AM

## 2014-10-09 NOTE — Progress Notes (Signed)
ANTIBIOTIC CONSULT NOTE  Pharmacy Consult for Zosyn Indication: rule out pneumonia  No Known Allergies Labs:  Recent Labs  10/07/14 0350 10/08/14 0424 10/09/14 0400  WBC 12.3* 8.5 6.3  HGB 9.9* 7.9* 6.9*  PLT 152 121* 105*  CREATININE 0.95 0.82 0.65   Estimated Creatinine Clearance: 68.8 mL/min (by C-G formula based on Cr of 0.65). No results for input(s): VANCOTROUGH, VANCOPEAK, VANCORANDOM, GENTTROUGH, GENTPEAK, GENTRANDOM, TOBRATROUGH, TOBRAPEAK, TOBRARND, AMIKACINPEAK, AMIKACINTROU, AMIKACIN in the last 72 hours.   Microbiology: Recent Results (from the past 720 hour(s))  MRSA PCR Screening     Status: None   Collection Time: 09/30/14  1:10 AM  Result Value Ref Range Status   MRSA by PCR NEGATIVE NEGATIVE Final    Comment:        The GeneXpert MRSA Assay (FDA approved for NASAL specimens only), is one component of a comprehensive MRSA colonization surveillance program. It is not intended to diagnose MRSA infection nor to guide or monitor treatment for MRSA infections.   Culture, respiratory (NON-Expectorated)     Status: None (Preliminary result)   Collection Time: 10/06/14  3:45 PM  Result Value Ref Range Status   Specimen Description TRACHEAL ASPIRATE  Final   Special Requests NONE  Final   Gram Stain   Final    ABUNDANT WBC PRESENT, PREDOMINANTLY PMN FEW SQUAMOUS EPITHELIAL CELLS PRESENT ABUNDANT GRAM POSITIVE COCCI IN PAIRS Performed at [facility]    Culture   Final    Culture reincubated for better growth Performed at [facility]    Report Status PENDING  Incomplete   Assessment: 69 yo M admitted 09/29/2014 with GIB, Also with intraabdominal RP/bleed requiring colectomy on xarelto PTA for afib  4/27 colectomy 4/28 developed hypoxia and acute resp distress on 2W xfer to 2s; pharmacy consulted to dose zosyn  ID- post op 4/28 pm acute resp distress and emergent intubation treating for r/o aspiration pneumonia v HCAP; Now extubated,   Consider ability to narrow (unasyn?) pending culture results.   Afeb, WBC wnl  4/28  Zosyn 3.375g IV q6h- 4hr infusion. >> 4/28 sputum, abundant GPC in pairs  Renal:  SCr stable CrCl ~60 ml/min  Goal of Therapy:  Clinical resolution of infection  Plan:  Continue Zosyn at current dose. Follow up SCr, UOP, cultures, clinical course and adjust as clinically indicated.  Thank you for allowing pharmacy to be a part of this patients care team.  Rowe Quindell Pharm.D., BCPS, AQ-Cardiology Clinical Pharmacist 10/09/2014 1:34 PM Pager: [phone] Phone: [phone]

## 2014-10-09 NOTE — Progress Notes (Signed)
PARENTERAL NUTRITION CONSULT NOTE   Pharmacy Consult for TPN Indication: NPO, PCM  No Known Allergies  Patient Measurements: Height: 5\' 9"  (175.3 cm) Weight: 119 lb 11.4 oz (54.3 kg) IBW/kg (Calculated) : 70.7  Intake/Output from previous day: 04/30 0701 - 05/01 0700 In: 2390.2 [I.V.:1584.9; NG/GT:60; IV Piggyback:250; TPN:495.3] Out: 2370 [Urine:2020; Emesis/NG output:350] Intake/Output from this shift: Total I/O In: 1158.7 [I.V.:568.7; IV Piggyback:150; TPN:440] Out: 1225 [Urine:1025; Emesis/NG output:200]  Labs:  Recent Labs  10/07/14 0350 10/08/14 0424 10/09/14 0400  WBC 12.3* 8.5 6.3  HGB 9.9* 7.9* 6.9*  HCT 29.3* 23.8* 20.9*  PLT 152 121* PENDING     Recent Labs  10/06/14 1612  10/06/14 2343 10/07/14 0350 10/08/14 0424 10/09/14 0400  NA  --   < > 134* 134* 133* 137  K  --   < > 4.2 4.1 3.5 3.2*  CL  --   < > 99 100 100 101  CO2  --   < > 25 25 25 27   GLUCOSE  --   < > 248* 275* 139* 135*  BUN  --   < > 15 15 18 12   CREATININE  --   < > 1.02 0.95 0.82 0.65  CALCIUM  --   < > 7.9* 7.9* 7.7* 7.7*  MG  --   --  1.4*  --  1.9 1.8  PHOS  --   --   --   --  2.5 3.3  PROT  --   --   --  4.9*  --  4.8*  ALBUMIN  --   --   --  1.9*  --  1.6*  AST  --   --   --  22  --  14*  ALT  --   --   --  15  --  15*  ALKPHOS  --   --   --  54  --  55  BILITOT  --   --   --  0.5  --  0.4  TRIG 83  --   --   --   --   --   < > = values in this interval not displayed. Estimated Creatinine Clearance: 68.8 mL/min (by C-G formula based on Cr of 0.65).    Recent Labs  10/08/14 1932 10/08/14 2331 10/09/14 0331  GLUCAP 134* 104* 139*    Medical History: Past Medical History  Diagnosis Date  . Depression   . Hypomagnesemia   . Dyspepsia   . Fall at nursing home 05/27/2013    "slipped on water; said I broke my right hip" (05/27/2013)  . Migraine     "used to have them bad; haven't had one in awhile" (05/27/2013)  . Gout attack     "not too long ago; in my right  foot" (05/27/2013)  . Complete heart block     reason for pacemaker/notes 05/27/2013  . Atrial fibrillation     [name] 05/27/2013  . HLD (hyperlipidemia)   . GIB (gastrointestinal bleeding)   . BPH (benign prostatic hyperplasia)     Insulin Requirements in the past 24 hours:  2 units ssi Surgeries/Procedures: 4/27  Subtotal colectomy with ileorectal anastomosis GI:  NPO   Alb 1.6  Anticipate prolonged npo status Endo: cbgs 104-139 Lytes: Na 137 K 3.2 (goal >4 with afib) Phos 3.3 , Mag 1.8 (goal >2 ), Ca 7.7, CoCa 9.38   KCl 10 mEq IV X 4 per protocol ordered Renal: SrCr 0.65, UOP 1.6 ml/kg/hr  Pulm: became hypoxic and transferred to ICU, now extubated and on RA  Cards:afib, BP soft, HR ok  On amio drip Hepatobil: LFTs ok, TG 83 Neuro: sedation off  ID:  Zosyn  4/28>>     WBC 6.3 , Tmax 99.1 TPN Access: CVC LIJ 4/28 TPN start date:  4/30  Current Nutrition:  Clinimix E 5/15 @ 40 ml/hr provides 48 gm protein and 681 kcal IVF D5NS @35  ml/hr Assessment: 69 yo man s/p Subtotal colectomy with ileorectal anastomosis for cecal wall mass and rectal mass to start TPN for NPO status and PCM.  Nutritional Goals:  Kcal 1700-1950, protein >97 g Clinimix E 5/15 at goal rate 83 ml/hr and 20% lipids @ 10 ml/hr will provide 99 gm protein and 1916 Kcal/day  Plan:  Increase Clinimix E 5/15 to 60 ml/hr Add lipids at 10 ml/hr KVO IVF F/u am TPN labs  Hali Balgobin Poteet 10/09/2014,6:51 AM

## 2014-10-09 NOTE — Progress Notes (Signed)
4 Days Post-Op  Subjective: No flatus yet, receiving 1u PRBC  Objective: Vital signs in last 24 hours: Temp:  [98.5 F (36.9 C)-99.2 F (37.3 C)] 98.7 F (37.1 C) (05/01 1030) Pulse Rate:  [42-105] 43 (05/01 1030) Resp:  [7-21] 18 (05/01 1030) BP: (120-165)/(47-83) 138/58 mmHg (05/01 1030) SpO2:  [90 %-100 %] 99 % (05/01 1030) Arterial Line BP: (132-169)/(50-60) 132/60 mmHg (05/01 0000) FiO2 (%):  [40 %] 40 % (04/30 1206) Weight:  [54.3 kg (119 lb 11.4 oz)] 54.3 kg (119 lb 11.4 oz) (05/01 0500) Last BM Date: 10/04/14  Intake/Output from previous day: 04/30 0701 - 05/01 0700 In: 2481.9 [I.V.:1636.6; NG/GT:60; IV Piggyback:250; TPN:535.3] Out: 2420 [Urine:2070; Emesis/NG output:350] Intake/Output this shift: Total I/O In: 620.5 [P.O.:60; I.V.:33.8; Blood:366.7; NG/GT:20; IV Piggyback:100; TPN:40] Out: 100 [Urine:100]  General appearance: alert and cooperative Resp: clear to auscultation bilaterally Cardio: irreg with occasional ectopy GI: soft, +BS, incision CDI  Lab Results:   Recent Labs  10/08/14 0424 10/09/14 0400  WBC 8.5 6.3  HGB 7.9* 6.9*  HCT 23.8* 20.9*  PLT 121* 105*   BMET  Recent Labs  10/08/14 0424 10/09/14 0400  NA 133* 137  K 3.5 3.2*  CL 100 101  CO2 25 27  GLUCOSE 139* 135*  BUN 18 12  CREATININE 0.82 0.65  CALCIUM 7.7* 7.7*   PT/INR No results for input(s): LABPROT, INR in the last 72 hours. ABG  Recent Labs  10/07/14 0325 10/08/14 1157  PHART 7.413 7.393  HCO3 24.0 23.8    Studies/Results: Dg Chest Port 1 View  10/09/2014   CLINICAL DATA:  Pneumonia.  EXAM: PORTABLE CHEST - 1 VIEW  COMPARISON:  10/08/2014  FINDINGS: Left IJ central line tip is unchanged, tip overlying the level of the superior vena cava. Right-sided pacer leads overlie the right atrium and right ventricle. Nasogastric tube tip is off the film but beyond the gastroesophageal junction. There is increased right-sided pleural effusion. Dense opacity at the left lung  base obscures the hemidiaphragm. There is a left pleural effusion.  IMPRESSION: 1. Bilateral pleural effusions, increased on the right. 2. Persistent dense left lower lobe infiltrate.   Electronically Signed   By: Nolon Nations M.D.   On: 10/09/2014 09:20   Dg Chest Port 1 View  10/08/2014   CLINICAL DATA:  Nasogastric tube placement. Atrial fibrillation. History of smoking.  EXAM: PORTABLE CHEST - 1 VIEW  COMPARISON:  10/08/2014 and earlier  FINDINGS: Left IJ central line has been placed, tip overlying the superior vena cava. Nasogastric tube is in place, tip not well seen but beyond the gastroesophageal junction.  Right-sided transvenous pacemaker leads overlie the right atrium and right ventricle. The heart is enlarged. There is dense opacity at the left lung base consistent with infiltrate.  There is lucency below the base the heart, suspicious for pneumopericardium or pneumoperitoneum. Further evaluation with left lateral decubitus view of the abdomen is recommended.  IMPRESSION: 1. Interval placement of left IJ central line.  No pneumothorax. 2. Nasogastric tube placed, tip beyond the gastroesophageal junction. 3. Question of abnormal gas collection in the upper abdomen. See above. Recommend left lateral decubitus view of the abdomen to exclude free intraperitoneal air. 4. The salient findings were discussed with Dr. Emmit Alexanders on 10/08/2014 at 6:58 pm.   Electronically Signed   By: Nolon Nations M.D.   On: 10/08/2014 19:00   Dg Chest Port 1 View  10/08/2014   CLINICAL DATA:  Hypoxia  EXAM: PORTABLE CHEST -  1 VIEW  COMPARISON:  October 07, 2014  FINDINGS: Endotracheal tube tip is 3.0 cm above the carina. Central catheter tip is in the superior vena cava. Nasogastric tube tip and side port are in the stomach. No pneumothorax. Pacemaker leads are attached to the right atrium right ventricle. Heart size and pulmonary vascularity are normal.  There is persistent patchy infiltrate in the left lower lobe.  Lungs are otherwise clear. No adenopathy.  IMPRESSION: Tube and catheter positions as described without pneumothorax. Persistent left lower lobe infiltrate without appreciable change. No new opacity. No change in cardiac silhouette.   Electronically Signed   By: Lowella Grip III M.D.   On: 10/08/2014 07:23   Dg Abd Decub  10/08/2014   CLINICAL DATA:  Evaluate for free intraperitoneal air. Chest radiograph demonstrating possible free intraperitoneal air.  EXAM: ABDOMEN - 1 VIEW DECUBITUS  COMPARISON:  CT of 09/30/2014  FINDINGS: Right-sided decubitus view demonstrates lucency in the right upper quadrant for which trace free intraperitoneal air is suspected. Midline laparotomy. Normal small bowel caliber. Right proximal femoral fixation.  IMPRESSION: 1. Suspicion of small volume right upper quadrant free intraperitoneal air. Critical test results telephoned to Kathlee Nations, r.n. at the time of interpretation at 10:39 p.m.on 10/08/2014. Consider dedicated abdominal series including left side up decubitus imaging. 2. No bowel distension.   Electronically Signed   By: Abigail Miyamoto M.D.   On: 10/08/2014 22:41    Anti-infectives: Anti-infectives    Start     Dose/Rate Route Frequency Ordered Stop   10/06/14 1445  piperacillin-tazobactam (ZOSYN) IVPB 3.375 g     3.375 g 12.5 mL/hr over 240 Minutes Intravenous 3 times per day 10/06/14 1443     10/05/14 1117  cefoTEtan in Dextrose 5% (CEFOTAN) 2-2.08 GM-% IVPB    Comments:  Ronnald Ramp, Tomika   : cabinet override      10/05/14 1117 10/05/14 2329   10/05/14 1045  cefoTEtan (CEFOTAN) 2 g in dextrose 5 % 50 mL IVPB     2 g 100 mL/hr over 30 Minutes Intravenous 30 min pre-op 10/04/14 1548 10/05/14 1140      Assessment/Plan: s/p Procedure(s): SUBTOTAL COLECTOMY WITH ILEORECTAL ANASTOMOSIS (N/A)  LOS: 10 days  POD#4 Await return of bowel function - good BS so OK for meds via NGT Acute on chronic anemiaB - TF Hypokalemia - replace Deconditioning -  PT/OT  Marcus Groll E 10/09/2014

## 2014-10-10 DIAGNOSIS — K254 Chronic or unspecified gastric ulcer with hemorrhage: Secondary | ICD-10-CM

## 2014-10-10 LAB — COMPREHENSIVE METABOLIC PANEL
ALBUMIN: 1.6 g/dL — AB (ref 3.5–5.0)
ALT: 14 U/L — ABNORMAL LOW (ref 17–63)
ANION GAP: 9 (ref 5–15)
AST: 15 U/L (ref 15–41)
Alkaline Phosphatase: 54 U/L (ref 38–126)
BILIRUBIN TOTAL: 0.6 mg/dL (ref 0.3–1.2)
BUN: 21 mg/dL — AB (ref 6–20)
CALCIUM: 7.9 mg/dL — AB (ref 8.9–10.3)
CO2: 23 mmol/L (ref 22–32)
CREATININE: 0.62 mg/dL (ref 0.61–1.24)
Chloride: 104 mmol/L (ref 101–111)
GFR calc non Af Amer: >60 mL/min (ref >60)
GLUCOSE: 175 mg/dL — AB (ref 70–99)
Potassium: 3.7 mmol/L (ref 3.5–5.1)
Sodium: 136 mmol/L (ref 135–145)
TOTAL PROTEIN: 4.8 g/dL — AB (ref 6.5–8.1)

## 2014-10-10 LAB — TYPE AND SCREEN
ABO/RH(D): O POS
Antibody Screen: NEGATIVE
UNIT DIVISION: 0

## 2014-10-10 LAB — GLUCOSE, CAPILLARY
GLUCOSE-CAPILLARY: 182 mg/dL — AB (ref 70–99)
GLUCOSE-CAPILLARY: 202 mg/dL — AB (ref 70–99)
GLUCOSE-CAPILLARY: 148 mg/dL — AB (ref 70–99)
GLUCOSE-CAPILLARY: 137 mg/dL — AB (ref 70–99)
Glucose-Capillary: 146 mg/dL — ABNORMAL HIGH (ref 70–99)
Glucose-Capillary: 164 mg/dL — ABNORMAL HIGH (ref 70–99)

## 2014-10-10 LAB — CBC
HCT: 38 % — ABNORMAL LOW (ref 39.0–52.0)
HEMOGLOBIN: 12.9 g/dL — AB (ref 13.0–17.0)
MCH: 31 pg (ref 26.0–34.0)
MCHC: 33.9 g/dL (ref 30.0–36.0)
MCV: 91.3 fL (ref 78.0–100.0)
Platelets: 156 10*3/uL (ref 150–400)
RBC: 4.16 MIL/uL — ABNORMAL LOW (ref 4.22–5.81)
RDW: 14.1 % (ref 11.5–15.5)
WBC: 12.5 10*3/uL — AB (ref 4.0–10.5)

## 2014-10-10 LAB — CULTURE, RESPIRATORY W GRAM STAIN

## 2014-10-10 LAB — CULTURE, RESPIRATORY

## 2014-10-10 LAB — PREALBUMIN: PREALBUMIN: 6.6 mg/dL — AB (ref 18–38)

## 2014-10-10 LAB — MAGNESIUM: MAGNESIUM: 1.7 mg/dL (ref 1.7–2.4)

## 2014-10-10 LAB — TRIGLYCERIDES: TRIGLYCERIDES: 101 mg/dL (ref <150)

## 2014-10-10 LAB — PHOSPHORUS: Phosphorus: 4.2 mg/dL (ref 2.5–4.6)

## 2014-10-10 MED ORDER — MAGNESIUM SULFATE IN D5W 10-5 MG/ML-% IV SOLN
1.0000 g | Freq: Once | INTRAVENOUS | Status: AC
  Administered 2014-10-10: 1 g via INTRAVENOUS
  Filled 2014-10-10: qty 100

## 2014-10-10 MED ORDER — POTASSIUM CHLORIDE 10 MEQ/50ML IV SOLN
10.0000 meq | INTRAVENOUS | Status: AC
  Administered 2014-10-10 (×2): 10 meq via INTRAVENOUS
  Filled 2014-10-10 (×2): qty 50

## 2014-10-10 MED ORDER — SODIUM CHLORIDE 0.9 % IV BOLUS (SEPSIS)
500.0000 mL | Freq: Once | INTRAVENOUS | Status: AC
  Administered 2014-10-10: 500 mL via INTRAVENOUS

## 2014-10-10 MED ORDER — TRACE MINERALS CR-CU-F-FE-I-MN-MO-SE-ZN IV SOLN
INTRAVENOUS | Status: AC
  Administered 2014-10-10: 18:00:00 via INTRAVENOUS
  Filled 2014-10-10: qty 1992

## 2014-10-10 NOTE — Progress Notes (Signed)
[place] NOTE   Pharmacy Consult for TPN Indication: NPO, PCM  No Known Allergies  Patient Measurements: Height: 5\' 9"  (175.3 cm) Weight: 123 lb 3.8 oz (55.9 kg) IBW/kg (Calculated) : 70.7  Intake/Output from previous day: 05/01 0701 - 05/02 0700 In: 2962.7 [P.O.:120; I.V.:373.9; Blood:366.7; NG/GT:110; IV Piggyback:450; TPN:1362.2] Out: 1425 [Urine:775; Emesis/NG output:650] Intake/Output from this shift: Total I/O In: 80 [I.V.:10; TPN:70] Out: -   Labs:  Recent Labs  10/08/14 0424 10/09/14 0400  WBC 8.5 6.3  HGB 7.9* 6.9*  HCT 23.8* 20.9*  PLT 121* 105*     Recent Labs  10/08/14 0424 10/09/14 0400 10/09/14 1405 10/10/14 0400  NA 133* 137  --  136  K 3.5 3.2*  --  3.7  CL 100 101  --  104  CO2 25 27  --  23  GLUCOSE 139* 135*  --  175*  BUN 18 12  --  21*  CREATININE 0.82 0.65  --  0.62  CALCIUM 7.7* 7.7*  --  7.9*  MG 1.9 1.8  --  1.7  PHOS 2.5 3.3  --  4.2  PROT  --  4.8*  --  4.8*  ALBUMIN  --  1.6*  --  1.6*  AST  --  14*  --  15  ALT  --  15*  --  14*  ALKPHOS  --  55  --  54  BILITOT  --  0.4  --  0.6  PREALBUMIN  --  3.7*  --  6.6*  TRIG  --   --  78 101   Estimated Creatinine Clearance: 70.8 mL/min (by C-G formula based on Cr of 0.62).    Recent Labs  10/10/14 0038 10/10/14 0459 10/10/14 0723  GLUCAP 164* 182* 202*    Insulin Requirements in the past 24 hours:  6 units ssi Surgeries/Procedures: 4/27  Subtotal colectomy with ileorectal anastomosis GI:  NPO   Alb 1.6  Anticipate prolonged npo status, prealbumin 3.7>6.6; no flatus; to keep NGT and allow sips Endo: cbgs 164 - 202, 6 units SSI given; goal < 180 in ICU pt Lytes: Na 136 K 3.7 (goal >4 with afib) Phos 4.2  , Mag 1.7  (goal >2 ), Ca 7.9, CoCa 9.82    Renal: SrCr stable, UOP 0.6 ml/kg/hr Pulm: became hypoxic and transferred to ICU, now extubated and on RA  Cards:afib,  Off amio drip Hepatobil: LFTs ok, TG 101 Neuro: sedation off  ID:  Zosyn  4/28>>      WBC 6.3 , AF TPN Access: CVC LIJ 4/28 TPN start date:  4/30  Current Nutrition:  NPO Clinimix E 5/15 60 ml/hr + IVFE at 10 ml/hr  Assessment: 69 yo man s/p Subtotal colectomy with ileorectal anastomosis for cecal wall mass and rectal mass on TPN for NPO status and PCM.  Nutritional Goals:  Kcal 1700-1950, protein >97 g Clinimix E 5/15 at goal rate 83 ml/hr and 20% lipids @ 10 ml/hr will provide 99 gm protein and 1916 Kcal/day  Plan:  -increase Clinimix E 5/15 to goal rate of 83 ml/hr which provides 99.6 gm protein (100% goal) and 1414 Kcals (~ 83 % Kcal goal). -Will withhold lipids while pt in ICU per ASPEN guideline - magnesium 1 gm bolus, k 2 runs and check BMET and mag in am -continue SSI, may need to add insulin to TPN to keep CBG < 180  Eudelia Bunch, Pharm.D. [phone] 10/10/2014 9:14 AM

## 2014-10-10 NOTE — Clinical Social Work Note (Signed)
The Covering CSW reviewed chart. The pt is a resident of [facility]. The pt reported that he will return back to [facility]. The Covering CSW faxed clinical to [facility] (phone [phone]). Ms. Tamala Julian reported that can accept the pt after the NG tube is removed. The Covering CSW will update the unit CSW.    Garnett Rekowski, MSW, LCSWA

## 2014-10-10 NOTE — Plan of Care (Signed)
Problem: Phase II Progression Outcomes Goal: Progress activity as tolerated unless otherwise ordered Outcome: Not Progressing PT consult ordered 10-09-14 Goal: Progressing with IS, TCDB Outcome: Progressing Pt frequently encouraged to perform IS/C.B.; only able to achieve 500 cc on I.S. Goal: Surgical site without signs of infection Outcome: Completed/Met Date Met:  10/10/14 M.A.I. with staples clean, dry and intact without s/s of infection.

## 2014-10-10 NOTE — Progress Notes (Signed)
5 Days Post-Op  Subjective: Awake and alert Complains of incisional pain No flatus yet  Objective: Vital signs in last 24 hours: Temp:  [97.4 F (36.3 C)-99.6 F (37.6 C)] 97.8 F (36.6 C) (05/02 0725) Pulse Rate:  [30-89] 30 (05/02 0700) Resp:  [13-26] 20 (05/02 0700) BP: (121-156)/(47-89) 144/62 mmHg (05/02 0700) SpO2:  [96 %-100 %] 100 % (05/02 0700) Weight:  [55.9 kg (123 lb 3.8 oz)] 55.9 kg (123 lb 3.8 oz) (05/02 0600) Last BM Date: 10/09/14  Intake/Output from previous day: 05/01 0701 - 05/02 0700 In: 2962.7 [P.O.:120; I.V.:373.9; Blood:366.7; NG/GT:110; IV Piggyback:450; TPN:1362.2] Out: 1425 [Urine:775; Emesis/NG output:650] Intake/Output this shift:    Abdomen soft, incision clean, +BS  Lab Results:   Recent Labs  10/08/14 0424 10/09/14 0400  WBC 8.5 6.3  HGB 7.9* 6.9*  HCT 23.8* 20.9*  PLT 121* 105*   BMET  Recent Labs  10/08/14 0424 10/09/14 0400  NA 133* 137  K 3.5 3.2*  CL 100 101  CO2 25 27  GLUCOSE 139* 135*  BUN 18 12  CREATININE 0.82 0.65  CALCIUM 7.7* 7.7*   PT/INR No results for input(s): LABPROT, INR in the last 72 hours. ABG  Recent Labs  10/08/14 1157  PHART 7.393  HCO3 23.8    Studies/Results: Dg Chest Port 1 View  10/09/2014   CLINICAL DATA:  Pneumonia.  EXAM: PORTABLE CHEST - 1 VIEW  COMPARISON:  10/08/2014  FINDINGS: Left IJ central line tip is unchanged, tip overlying the level of the superior vena cava. Right-sided pacer leads overlie the right atrium and right ventricle. Nasogastric tube tip is off the film but beyond the gastroesophageal junction. There is increased right-sided pleural effusion. Dense opacity at the left lung base obscures the hemidiaphragm. There is a left pleural effusion.  IMPRESSION: 1. Bilateral pleural effusions, increased on the right. 2. Persistent dense left lower lobe infiltrate.   Electronically Signed   By: Nolon Nations M.D.   On: 10/09/2014 09:20   Dg Chest Port 1 View  10/08/2014    CLINICAL DATA:  Nasogastric tube placement. Atrial fibrillation. History of smoking.  EXAM: PORTABLE CHEST - 1 VIEW  COMPARISON:  10/08/2014 and earlier  FINDINGS: Left IJ central line has been placed, tip overlying the superior vena cava. Nasogastric tube is in place, tip not well seen but beyond the gastroesophageal junction.  Right-sided transvenous pacemaker leads overlie the right atrium and right ventricle. The heart is enlarged. There is dense opacity at the left lung base consistent with infiltrate.  There is lucency below the base the heart, suspicious for pneumopericardium or pneumoperitoneum. Further evaluation with left lateral decubitus view of the abdomen is recommended.  IMPRESSION: 1. Interval placement of left IJ central line.  No pneumothorax. 2. Nasogastric tube placed, tip beyond the gastroesophageal junction. 3. Question of abnormal gas collection in the upper abdomen. See above. Recommend left lateral decubitus view of the abdomen to exclude free intraperitoneal air. 4. The salient findings were discussed with Dr. Emmit Alexanders on 10/08/2014 at 6:58 pm.   Electronically Signed   By: Nolon Nations M.D.   On: 10/08/2014 19:00   Dg Abd Decub  10/08/2014   CLINICAL DATA:  Evaluate for free intraperitoneal air. Chest radiograph demonstrating possible free intraperitoneal air.  EXAM: ABDOMEN - 1 VIEW DECUBITUS  COMPARISON:  CT of 09/30/2014  FINDINGS: Right-sided decubitus view demonstrates lucency in the right upper quadrant for which trace free intraperitoneal air is suspected. Midline laparotomy. Normal small bowel  caliber. Right proximal femoral fixation.  IMPRESSION: 1. Suspicion of small volume right upper quadrant free intraperitoneal air. Critical test results telephoned to Kathlee Nations, r.n. at the time of interpretation at 10:39 p.m.on 10/08/2014. Consider dedicated abdominal series including left side up decubitus imaging. 2. No bowel distension.   Electronically Signed   By: Abigail Miyamoto M.D.   On:  10/08/2014 22:41    Anti-infectives: Anti-infectives    Start     Dose/Rate Route Frequency Ordered Stop   10/06/14 1445  piperacillin-tazobactam (ZOSYN) IVPB 3.375 g     3.375 g 12.5 mL/hr over 240 Minutes Intravenous 3 times per day 10/06/14 1443     10/05/14 1117  cefoTEtan in Dextrose 5% (CEFOTAN) 2-2.08 GM-% IVPB    Comments:  Ronnald Ramp, [place]   : cabinet override      10/05/14 1117 10/05/14 2329   10/05/14 1045  cefoTEtan (CEFOTAN) 2 g in dextrose 5 % 50 mL IVPB     2 g 100 mL/hr over 30 Minutes Intravenous 30 min pre-op 10/04/14 1548 10/05/14 1140      Assessment/Plan: s/p Procedure(s): SUBTOTAL COLECTOMY WITH ILEORECTAL ANASTOMOSIS (N/A)  Will leave the NG until tomorrow Will allow sips  LOS: 11 days    Liston Thum A 10/10/2014

## 2014-10-10 NOTE — Care Management Note (Signed)
    Page 1 of 2   10/13/2014     11:35:25 AM CARE MANAGEMENT NOTE 10/13/2014  Patient:  [name], [name]   Account Number:  [id number]  Date Initiated:  10/03/2014  Documentation initiated by:  Marvetta Gibbons  Subjective/Objective Assessment:   Pt admitted with GIB     Action/Plan:   PTA pt lived at home   Anticipated DC Date:  10/10/2014   Anticipated DC Plan:  [place] referral  Clinical Social Worker      [place]  CM consult      Choice offered to / List presented to:             Status of service:  In process, will continue to follow Medicare Important Message given?  YES (If response is "NO", the following Medicare IM given date fields will be blank) Date Medicare IM given:  10/04/2014 Medicare IM given by:  Marvetta Gibbons Date Additional Medicare IM given:  10/13/2014 Additional Medicare IM given by:  [place]  Discharge Disposition:    Per UR Regulation:  Reviewed for med. necessity/level of care/duration of stay  If discussed at [place] of Stay Meetings, dates discussed:   10/04/2014  10/06/2014    Comments:  10-13-14 [place], RNBSN [phone] Nursing tells me he will need more assistance than AL due to no control of BM's from surgery.  Cannot care for self. Updated SW on 10-12-14.  Plan for tx to M/S unit today.  10-10-14 10:20am Luz Lex, RNBSN - [phone] Extubated on 4-30, amio off on 5-1, remains on TNa and NG tube.  Plan for SNF - SW consult placed.  10-07-14 [place] Developed resp failure  - requiring emergent intubation on 4-28  10/04/14- 1200- Marvetta Gibbons RN, BSN [phone] Surgery hopeful to plan for OR tomorrow pending path. reports coming in today.

## 2014-10-10 NOTE — Evaluation (Signed)
Physical Therapy Evaluation Patient Details Name: [name] MRN: [id number] DOB: [birth year] Today's Date: 10/10/2014   History of Present Illness  Pt is a 69 y.o. 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 Pt underwent subtotal colectomy 4/27 and was emergently intubated on 4/28 when pt developed acute respiratory distress. He was extubated 4/30.  Clinical Impression  Pt admitted with above diagnosis. Pt currently with functional limitations due to the deficits listed below (see PT Problem List). At the time of PT eval pt required significant encouragement to participate in PT session. Pt was educated in the benefits of OOB and participating with mobility. Pt is showing a functional decline since last evaluation on 4/22 when pt was at baseline of function. Pt will benefit from skilled PT to increase their independence and safety with mobility to allow discharge to the venue listed below. If pt does not functionally improve or participate with future therapy sessions, may want to consider SNF.      Follow Up Recommendations Home health PT;Supervision/Assistance - 24 hour    Equipment Recommendations  None recommended by PT    Recommendations for Other Services       Precautions / Restrictions Precautions Precautions: Fall Restrictions Weight Bearing Restrictions: No      Mobility  Bed Mobility Overal bed mobility: Needs Assistance Bed Mobility: Rolling;Sidelying to Sit Rolling: Min assist Sidelying to sit: Mod assist       General bed mobility comments: Pt not following cues for log roll technique and was complaining of increased pain when transitioning to EOB. He was educated on the benefits of log roll. Mod assist to elevate trunk to full sitting position.   Transfers Overall transfer level: Needs assistance Equipment used: 2 person  hand held assist Transfers: Sit to/from [facility] Sit to Stand: Min assist;+2 physical assistance Stand pivot transfers: Min assist;+2 physical assistance       General transfer comment: Assist to power-up to full standing position and to take pivotal steps around to the recliner chair. Pt was provided assist for balance and general support.   Ambulation/Gait             General Gait Details: Deferred as pt is at a transfers-only level at home. Wheelchair mobile.   Stairs            Wheelchair Mobility    Modified Rankin (Stroke Patients Only)       Balance Overall balance assessment: Needs assistance Sitting-balance support: Feet supported;No upper extremity supported Sitting balance-Leahy Scale: Fair     Standing balance support: Bilateral upper extremity supported Standing balance-Leahy Scale: Poor Standing balance comment: Pt requires UE support for balance at this time.                              Pertinent Vitals/Pain Pain Assessment: Faces Faces Pain Scale: Hurts even more Pain Location: Abdomen with movement Pain Descriptors / Indicators: Aching Pain Intervention(s): Limited activity within patient's tolerance;Monitored during session;Repositioned    Home Living Family/patient expects to be discharged to:: Assisted living               Home Equipment: Walker - 2 wheels;Wheelchair - manual;Grab bars - tub/shower;Grab bars - toilet;Toilet riser;Shower seat      Prior Function Level of Independence: Independent with assistive device(s)  Hand Dominance   Dominant Hand: Right    Extremity/Trunk Assessment   Upper Extremity Assessment: Generalized weakness           Lower Extremity Assessment: Generalized weakness (Significant muscle wasting noted in BLE's)      Cervical / Trunk Assessment: Normal  Communication   Communication: No difficulties  Cognition Arousal/Alertness:  Awake/alert Behavior During Therapy: WFL for tasks assessed/performed Overall Cognitive Status: Within Functional Limits for tasks assessed                      General Comments      Exercises        Assessment/Plan    PT Assessment Patient needs continued PT services  PT Diagnosis Difficulty walking;Generalized weakness;Acute pain   PT Problem List Decreased strength;Decreased range of motion;Decreased activity tolerance;Decreased balance;Decreased mobility;Decreased knowledge of use of DME;Decreased safety awareness;Decreased knowledge of precautions;Pain  PT Treatment Interventions DME instruction;Functional mobility training;Therapeutic activities;Therapeutic exercise;Neuromuscular re-education;Patient/family education;Wheelchair mobility training   PT Goals (Current goals can be found in the Care Plan section) Acute Rehab PT Goals Patient Stated Goal: to go back to ALF PT Goal Formulation: With patient Time For Goal Achievement: 10/24/14 Potential to Achieve Goals: Good    Frequency Min 3X/week   Barriers to discharge        Co-evaluation               End of Session Equipment Utilized During Treatment: Gait belt Activity Tolerance: No increased pain Patient left: in chair;with call bell/phone within reach;with chair alarm set Nurse Communication: Mobility status         Time: 0569-7948 PT Time Calculation (min) (ACUTE ONLY): 20 min   Charges:   PT Evaluation $Initial PT Evaluation Tier I: 1 Procedure     PT G CodesRolinda Roan 10-19-14, 12:02 PM   Rolinda Roan, PT, DPT Acute Rehabilitation Services Pager: [phone]

## 2014-10-10 NOTE — Progress Notes (Signed)
PULMONARY / CRITICAL CARE MEDICINE   Name: [name] MRN: [id number] DOB: 1945-12-07    ADMISSION DATE:  09/29/2014 CONSULTATION DATE:  4/28   REFERRING MD :  Allyson Sabal   CHIEF COMPLAINT:  Acute respiratory failure   INITIAL PRESENTATION:  69yo male smoker with hx AFib on xarelto, gout, depression, hx GI bleed.  Recently admitted from 4/16 to 4/18 because of GI bleeding. He had EGD on 4/18, which was negative for active bleeding. Ultimately found to have cecal wall and rectal mass on f/u colonoscopy.  Returned 4/21 with rectal bleeding and ultimately underwent subtotal colectomy 4/27.  Was doing well post op until 4/28 when he developed acute respiratory distress, hypoxia with sats 60% despite NRB and PCCM called for ICU tx and emergent intubation.    STUDIES:  CT abd 4/22>>>Heterogeneous collection measuring 1.8 x 4.6 cm over the posterior right mid abdomen immediately posterior to several small bowel loops and inferior to the right kidney. This likely represents a small hemorrhagic collection with etiology uncertain. No evidence of free peritoneal air or bowel obstruction.  SIGNIFICANT EVENTS: 4/27 OR>> subtotal colectomy  4/28> respiratory failure, tx ICU, intubation  4/29 A fib / flutter o/n, started on amio 4/30 extubated  SUBJECTIVE:  No acute distress, no acute events   VITAL SIGNS: Temp:  [97.4 F (36.3 C)-99.6 F (37.6 C)] 97.4 F (36.3 C) (05/02 1139) Pulse Rate:  [30-89] 32 (05/02 1100) Resp:  [13-26] 21 (05/02 1200) BP: (121-156)/(51-89) 143/58 mmHg (05/02 1000) SpO2:  [97 %-100 %] 100 % (05/02 1200) Weight:  [123 lb 3.8 oz (55.9 kg)] 123 lb 3.8 oz (55.9 kg) (05/02 0600) HEMODYNAMICS: CVP:  [4 mmHg-8 mmHg] 7 mmHg VENTILATOR SETTINGS:   INTAKE / OUTPUT:  Intake/Output Summary (Last 24 hours) at 10/10/14 1225 Last data filed at 10/10/14 1200  Gross per 24 hour  Intake 2342.16 ml  Output   1575 ml  Net 767.16 ml    PHYSICAL EXAMINATION:  Gen: chronically  ill appearing HENT: OP clear, NCAT, NG in place PULM: CTA B, normal percussion CV: Irreg irreg, no mgr, trace edema GI: BS infrequent, belly soft Derm: surgical scar intact Psyche: normal mood and affect    LABS:  CBC  Recent Labs Lab 10/08/14 0424 10/09/14 0400 10/10/14 0845  WBC 8.5 6.3 12.5*  HGB 7.9* 6.9* 12.9*  HCT 23.8* 20.9* 38.0*  PLT 121* 105* 156   Coag's No results for input(s): APTT, INR in the last 168 hours. BMET  Recent Labs Lab 10/08/14 0424 10/09/14 0400 10/10/14 0400  NA 133* 137 136  K 3.5 3.2* 3.7  CL 100 101 104  CO2 25 27 23   BUN 18 12 21*  CREATININE 0.82 0.65 0.62  GLUCOSE 139* 135* 175*   Electrolytes  Recent Labs Lab 10/08/14 0424 10/09/14 0400 10/10/14 0400  CALCIUM 7.7* 7.7* 7.9*  MG 1.9 1.8 1.7  PHOS 2.5 3.3 4.2   Sepsis Markers No results for input(s): LATICACIDVEN, PROCALCITON, O2SATVEN in the last 168 hours. ABG  Recent Labs Lab 10/06/14 2043 10/07/14 0325 10/08/14 1157  PHART 7.420 7.413 7.393  PCO2ART 44.4 38.3 39.0  PO2ART 374.0* 235* 128.0*   Liver Enzymes  Recent Labs Lab 10/07/14 0350 10/09/14 0400 10/10/14 0400  AST 22 14* 15  ALT 15 15* 14*  ALKPHOS 54 55 54  BILITOT 0.5 0.4 0.6  ALBUMIN 1.9* 1.6* 1.6*   Cardiac Enzymes No results for input(s): TROPONINI, PROBNP in the last 168 hours. Glucose  Recent  Labs Lab 10/09/14 1120 10/09/14 1657 10/09/14 2011 10/10/14 0038 10/10/14 0459 10/10/14 0723  GLUCAP 140* 128* 123* 164* 182* 202*    Imaging Dg Chest Port 1 View  10/09/2014   CLINICAL DATA:  Pneumonia.  EXAM: PORTABLE CHEST - 1 VIEW  COMPARISON:  10/08/2014  FINDINGS: Left IJ central line tip is unchanged, tip overlying the level of the superior vena cava. Right-sided pacer leads overlie the right atrium and right ventricle. Nasogastric tube tip is off the film but beyond the gastroesophageal junction. There is increased right-sided pleural effusion. Dense opacity at the left lung base  obscures the hemidiaphragm. There is a left pleural effusion.  IMPRESSION: 1. Bilateral pleural effusions, increased on the right. 2. Persistent dense left lower lobe infiltrate.   Electronically Signed   By: Nolon Nations M.D.   On: 10/09/2014 09:20     ASSESSMENT / PLAN:  PULMONARY OETT 4/28>>> 4/30 Acute hypoxic respiratory failure -resolvued, suspect COPD related, +/- HCAP Likely COPD, needs outpatient PFT P:   Continue bronchodilators Needs outpatient PFBV  CARDIOVASCULAR CVL L IJ CVL 4/28>>> Shock > resolved Hx HTN  AFib with RVR (chronic)  CHADS-VASc =2c P:  Cont hold anticoagulation post op  Continue coreg at 1/2 home dose, hold cardura, diltiazem for now Use  lopressor prn   RENAL Oliguria - low CVP Hyopnatremia/ hypokalemia P:   Monitor BMET and UOP Replace electrolytes as needed  GASTROINTESTINAL Rectal bleeding due to Rectal mass > resolved S/p subtotal colectomy 4/27 Retroperitoneal bleed - small.   Protein calorie malnutrition P:    Surgery following  NPO, continue TPN per surgery Famotidine   HEMATOLOGIC Anemia - resolved Thrombocytopenia  P:  Sq heparin  Hold full anticoagulation (A Fib) for now  F/u CBC   INFECTIOUS HCAP v aspiration PNA LLL  > resolved P:   Sputum 4/28>>> Zosyn 4/28>>>5/2  ENDOCRINE No active issue  P:   Monitor glucose while npo  NEUROLOGIC AMS - r/t severe respiratory failure, shock Paralysis briefly due to vent dyssynchrony  P:   Encouraged PT   FAMILY  - Updates:  No family; he is a nursing home resident, reportedly with no family or representative.   Roselie Awkward, MD [place] PCCM Pager: [phone] Cell: [phone] If no response, call [phone]  10/10/2014, 12:25 PM

## 2014-10-10 NOTE — Progress Notes (Signed)
eLink Physician-Brief Progress Note Patient Name: [name] DOB: 06/13/1945 MRN: [id number]   Date of Service  10/10/2014  HPI/Events of Note  [facility] REVIEWED AND D/W CRIT CARE  eICU Interventions       Intervention Category Major Interventions: Infection - evaluation and management  Raylene Miyamoto. 10/10/2014, 5:12 PM

## 2014-10-11 DIAGNOSIS — E871 Hypo-osmolality and hyponatremia: Secondary | ICD-10-CM | POA: Diagnosis present

## 2014-10-11 DIAGNOSIS — J189 Pneumonia, unspecified organism: Secondary | ICD-10-CM | POA: Diagnosis present

## 2014-10-11 DIAGNOSIS — D62 Acute posthemorrhagic anemia: Secondary | ICD-10-CM | POA: Diagnosis present

## 2014-10-11 DIAGNOSIS — I4891 Unspecified atrial fibrillation: Secondary | ICD-10-CM

## 2014-10-11 DIAGNOSIS — K661 Hemoperitoneum: Secondary | ICD-10-CM

## 2014-10-11 DIAGNOSIS — E876 Hypokalemia: Secondary | ICD-10-CM

## 2014-10-11 DIAGNOSIS — R58 Hemorrhage, not elsewhere classified: Secondary | ICD-10-CM | POA: Diagnosis present

## 2014-10-11 DIAGNOSIS — R410 Disorientation, unspecified: Secondary | ICD-10-CM

## 2014-10-11 DIAGNOSIS — I10 Essential (primary) hypertension: Secondary | ICD-10-CM | POA: Diagnosis present

## 2014-10-11 DIAGNOSIS — J9621 Acute and chronic respiratory failure with hypoxia: Secondary | ICD-10-CM | POA: Diagnosis present

## 2014-10-11 DIAGNOSIS — K625 Hemorrhage of anus and rectum: Secondary | ICD-10-CM | POA: Diagnosis present

## 2014-10-11 LAB — BASIC METABOLIC PANEL
Anion gap: 10 (ref 5–15)
BUN: 20 mg/dL (ref 6–20)
CO2: 23 mmol/L (ref 22–32)
CREATININE: 0.51 mg/dL — AB (ref 0.61–1.24)
Calcium: 7.9 mg/dL — ABNORMAL LOW (ref 8.9–10.3)
Chloride: 101 mmol/L (ref 101–111)
GFR calc Af Amer: >60 mL/min (ref >60)
GFR calc non Af Amer: >60 mL/min (ref >60)
Glucose, Bld: 132 mg/dL — ABNORMAL HIGH (ref 70–99)
Potassium: 3.7 mmol/L (ref 3.5–5.1)
Sodium: 134 mmol/L — ABNORMAL LOW (ref 135–145)

## 2014-10-11 LAB — GLUCOSE, CAPILLARY
GLUCOSE-CAPILLARY: 116 mg/dL — AB (ref 70–99)
GLUCOSE-CAPILLARY: 128 mg/dL — AB (ref 70–99)
GLUCOSE-CAPILLARY: 151 mg/dL — AB (ref 70–99)
Glucose-Capillary: 150 mg/dL — ABNORMAL HIGH (ref 70–99)
Glucose-Capillary: 167 mg/dL — ABNORMAL HIGH (ref 70–99)
Glucose-Capillary: 133 mg/dL — ABNORMAL HIGH (ref 70–99)
Glucose-Capillary: 139 mg/dL — ABNORMAL HIGH (ref 70–99)

## 2014-10-11 LAB — MAGNESIUM: Magnesium: 1.9 mg/dL (ref 1.7–2.4)

## 2014-10-11 MED ORDER — MAGNESIUM SULFATE IN D5W 10-5 MG/ML-% IV SOLN
1.0000 g | Freq: Once | INTRAVENOUS | Status: AC
  Administered 2014-10-11: 1 g via INTRAVENOUS
  Filled 2014-10-11: qty 100

## 2014-10-11 MED ORDER — POTASSIUM CHLORIDE 10 MEQ/50ML IV SOLN
10.0000 meq | INTRAVENOUS | Status: AC
  Administered 2014-10-11 (×3): 10 meq via INTRAVENOUS
  Filled 2014-10-11 (×3): qty 50

## 2014-10-11 MED ORDER — TRACE MINERALS CR-CU-F-FE-I-MN-MO-SE-ZN IV SOLN
INTRAVENOUS | Status: AC
  Administered 2014-10-11: 18:00:00 via INTRAVENOUS
  Filled 2014-10-11: qty 1992

## 2014-10-11 NOTE — Evaluation (Signed)
Occupational Therapy Evaluation Patient Details Name: [name] MRN: [id number] DOB: May 18, 1946 Today's Date: 10/11/2014    History of Present Illness Pt is a 69 y.o. 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 Pt underwent subtotal colectomy 4/27 and was emergently intubated on 4/28 when pt developed acute respiratory distress. He was extubated 4/30.   Clinical Impression   Pt admitted with above. He demonstrates the below listed deficits and will benefit from continued OT to maximize safety and independence with BADLs.  Pt presents to OT with generalized weakness and pain.  He requires min - max A with ADLs.  He resides at ALF, and recommend he return there at discharge with [place].        Follow Up Recommendations  Home health OT;Other (comment) (return to ALF )    Equipment Recommendations  None recommended by OT    Recommendations for Other Services       Precautions / Restrictions Precautions Precautions: Fall      Mobility Bed Mobility Overal bed mobility: Needs Assistance Bed Mobility: Rolling;Sidelying to Sit Rolling: Min assist Sidelying to sit: Min assist       General bed mobility comments: Pt instructed in need to log roll.  Pt requires min A to move feet to EOB and to lift trunk from bed   Transfers Overall transfer level: Needs assistance Equipment used: 1 person hand held assist Transfers: Sit to/from Stand;Stand Pivot Transfers Sit to Stand: Min assist;+2 safety/equipment Stand pivot transfers: Min assist;+2 safety/equipment       General transfer comment: min A to move into standing, and assist to steady and guide pt to recliner     Balance Overall balance assessment: Needs assistance Sitting-balance support: Feet supported Sitting balance-Leahy Scale: Fair     Standing balance support: Bilateral  upper extremity supported Standing balance-Leahy Scale: Poor Standing balance comment: requires min A                             ADL Overall ADL's : Needs assistance/impaired Eating/Feeding: Modified independent;Bed level;Sitting   Grooming: Wash/dry hands;Wash/dry face;Brushing hair;Set up;Sitting;Bed level   Upper Body Bathing: Moderate assistance;Bed level   Lower Body Bathing: Maximal assistance;Bed level;Sit to/from stand   Upper Body Dressing : Maximal assistance;Sitting   Lower Body Dressing: Total assistance;Sit to/from stand;Bed level   Toilet Transfer: Minimal assistance;+2 for safety/equipment;Stand-pivot;BSC Toilet Transfer Details (indicate cue type and reason): Pt is fearful of falling  Toileting- Clothing Manipulation and Hygiene: Total assistance;Sit to/from stand       Functional mobility during ADLs: Minimal assistance;+2 for safety/equipment General ADL Comments: Pt is limited by pain.  Requires encouragement to participate      Vision     Perception     Praxis      Pertinent Vitals/Pain Pain Assessment: 0-10 Pain Score: 7  Pain Location: abdomen  Pain Descriptors / Indicators: Aching;Constant Pain Intervention(s): Limited activity within patient's tolerance;Repositioned;RN gave pain meds during session;Monitored during session     Hand Dominance Right   Extremity/Trunk Assessment Upper Extremity Assessment Upper Extremity Assessment: Generalized weakness   Lower Extremity Assessment Lower Extremity Assessment: Defer to PT evaluation   Cervical / Trunk Assessment Cervical / Trunk Assessment: Normal   Communication Communication Communication: No difficulties   Cognition Arousal/Alertness: Awake/alert Behavior During Therapy: WFL for tasks assessed/performed Overall Cognitive Status:  Within Functional Limits for tasks assessed                     General Comments       Exercises       Shoulder Instructions       Home Living Family/patient expects to be discharged to:: Assisted living                             Home Equipment: Walker - 2 wheels;Wheelchair - manual;Grab bars - tub/shower;Grab bars - toilet;Toilet riser;Shower seat          Prior Functioning/Environment Level of Independence: Independent with assistive device(s)        Comments: Pt reports he has lived at [facility] x 11 years and is mod I from a w/c level     OT Diagnosis: Generalized weakness;Acute pain   OT Problem List: Decreased strength;Decreased activity tolerance;Impaired balance (sitting and/or standing);Decreased safety awareness;Decreased knowledge of use of DME or AE;Decreased knowledge of precautions;Pain   OT Treatment/Interventions: Self-care/ADL training;DME and/or AE instruction;Therapeutic activities;Balance training    OT Goals(Current goals can be found in the care plan section) Acute Rehab OT Goals Patient Stated Goal: to go back to ALF OT Goal Formulation: With patient Time For Goal Achievement: 10/25/14 Potential to Achieve Goals: Good ADL Goals Pt Will Perform Upper Body Bathing: with supervision;with set-up;sitting Pt Will Perform Lower Body Bathing: with supervision;sit to/from stand Pt Will Perform Upper Body Dressing: with set-up;with supervision;sitting Pt Will Perform Lower Body Dressing: with supervision;sit to/from stand Pt Will Transfer to Toilet: with supervision;stand pivot transfer;bedside commode Pt Will Perform Toileting - Clothing Manipulation and hygiene: with supervision;sit to/from stand  OT Frequency: Min 2X/week   Barriers to D/C: Decreased caregiver support  Pt has lived at ALF x 11 years        Co-evaluation              End of Session Nurse Communication: Mobility status  Activity Tolerance: Patient limited by pain Patient left: in chair;with call bell/phone within reach;with chair alarm set;with nursing/sitter in room   Time: 0321-2248 OT  Time Calculation (min): 17 min Charges:  OT General Charges $OT Visit: 1 Procedure OT Evaluation $Initial OT Evaluation Tier I: 1 Procedure G-Codes:    Lucille Passy M Oct 29, 2014, 3:40 PM

## 2014-10-11 NOTE — Progress Notes (Addendum)
PARENTERAL NUTRITION CONSULT NOTE   Pharmacy Consult for TPN Indication: NPO, PCM  No Known Allergies  Patient Measurements: Height: 5\' 9"  (175.3 cm) Weight: 123 lb 3.8 oz (55.9 kg) IBW/kg (Calculated) : 70.7  Intake/Output from previous day: 05/02 0701 - 05/03 0700 In: 2958.1 [I.V.:245; NG/GT:60; IV Piggyback:800; TPN:1853.1] Out: 2085 [Urine:1385; Emesis/NG output:700] Intake/Output from this shift:    Labs:  Recent Labs  10/09/14 0400 10/10/14 0845  WBC 6.3 12.5*  HGB 6.9* 12.9*  HCT 20.9* 38.0*  PLT 105* 156     Recent Labs  10/09/14 0400 10/09/14 1405 10/10/14 0400 10/11/14 0400  NA 137  --  136 134*  K 3.2*  --  3.7 3.7  CL 101  --  104 101  CO2 27  --  23 23  GLUCOSE 135*  --  175* 132*  BUN 12  --  21* 20  CREATININE 0.65  --  0.62 0.51*  CALCIUM 7.7*  --  7.9* 7.9*  MG 1.8  --  1.7 1.9  PHOS 3.3  --  4.2  --   PROT 4.8*  --  4.8*  --   ALBUMIN 1.6*  --  1.6*  --   AST 14*  --  15  --   ALT 15*  --  14*  --   ALKPHOS 55  --  54  --   BILITOT 0.4  --  0.6  --   PREALBUMIN 3.7*  --  6.6*  --   TRIG  --  78 101  --    Estimated Creatinine Clearance: 70.8 mL/min (by C-G formula based on Cr of 0.51).    Recent Labs  10/11/14 0001 10/11/14 0422 10/11/14 0728  GLUCAP 151* 128* 150*    Insulin Requirements in the past 24 hours:  6 units ssi since rate increased to 83 Surgeries/Procedures: 4/27  Subtotal colectomy with ileorectal anastomosis GI:  POD #6, having BMs, DC NG and start clear liquids.   Alb 1.6 prealbumin 3.7>6.6; Endo: cbgs 132-151, 6 units SSI given; goal < 180 in ICU pt Lytes: Na 134,  K 3.7 after 2 runs yesterday  (goal >4 with afib)  Mag 1.9 after 1 gm yesterday  (goal >2 ), Ca 7.9, CoCa 9.82    Renal: SrCr stable, UOP 0.6 ml/kg/hr Pulm: became hypoxic and transferred to ICU, now extubated and on RA  Cards:afib,  Off amio drip Hepatobil: LFTs ok, TG 101 Neuro: sedation off  ID:  Zosyn  4/28>>     WBC 6.3>>12.5 , AF TPN  Access: CVC LIJ 4/28 TPN start date:  4/30  Current Nutrition:  Clear liq 5/3 Clinimix E 5/15 at 83 ml/hr + IVFE at 10 ml/hr  Assessment: 69 yo man s/p Subtotal colectomy with ileorectal anastomosis for cecal wall mass and rectal mass on TPN for NPO status and PCM.  Nutritional Goals:  Kcal 1700-1950, protein >97 g Clinimix E 5/15 at goal rate 83 ml/hr and 20% lipids @ 10 ml/hr will provide 99 gm protein and 1916 Kcal/day  Plan:  -continue Clinimix E 5/15 at goal rate of 83 ml/hr which provides 99.6 gm protein (100% goal) and 1414 Kcals (~ 83 % Kcal goal). -Will withhold lipids x 7 days while pt in ICU  per ASPEN guideline - magnesium 1 gm bolus, k 3 runs and check BMET and mag in am -continue SSI  Eudelia Bunch, Pharm.D. [phone] 10/11/2014 8:44 AM

## 2014-10-11 NOTE — Progress Notes (Signed)
6 Days Post-Op  Subjective: Having BM's  Objective: Vital signs in last 24 hours: Temp:  [97.4 F (36.3 C)-99.5 F (37.5 C)] 99.5 F (37.5 C) (05/03 0733) Pulse Rate:  [32-96] 92 (05/03 0700) Resp:  [16-31] 16 (05/03 0700) BP: (126-162)/(57-91) 133/60 mmHg (05/03 0700) SpO2:  [84 %-100 %] 100 % (05/03 0700) Last BM Date: 10/10/14  Intake/Output from previous day: 05/02 0701 - 05/03 0700 In: 2958.1 [I.V.:245; NG/GT:60; IV Piggyback:800; TPN:1853.1] Out: 2085 [Urine:1385; Emesis/NG output:700] Intake/Output this shift:    Abdomen soft, appropriately tender  Lab Results:   Recent Labs  10/09/14 0400 10/10/14 0845  WBC 6.3 12.5*  HGB 6.9* 12.9*  HCT 20.9* 38.0*  PLT 105* 156   BMET  Recent Labs  10/10/14 0400 10/11/14 0400  NA 136 134*  K 3.7 3.7  CL 104 101  CO2 23 23  GLUCOSE 175* 132*  BUN 21* 20  CREATININE 0.62 0.51*  CALCIUM 7.9* 7.9*   PT/INR No results for input(s): LABPROT, INR in the last 72 hours. ABG  Recent Labs  10/08/14 1157  PHART 7.393  HCO3 23.8    Studies/Results: No results found.  Anti-infectives: Anti-infectives    Start     Dose/Rate Route Frequency Ordered Stop   10/06/14 1445  piperacillin-tazobactam (ZOSYN) IVPB 3.375 g  Status:  Discontinued     3.375 g 12.5 mL/hr over 240 Minutes Intravenous 3 times per day 10/06/14 1443 10/10/14 1232   10/05/14 1117  cefoTEtan in Dextrose 5% (CEFOTAN) 2-2.08 GM-% IVPB    Comments:  Ronnald Ramp, Tomika   : cabinet override      10/05/14 1117 10/05/14 2329   10/05/14 1045  cefoTEtan (CEFOTAN) 2 g in dextrose 5 % 50 mL IVPB     2 g 100 mL/hr over 30 Minutes Intravenous 30 min pre-op 10/04/14 1548 10/05/14 1140      Assessment/Plan: s/p Procedure(s): SUBTOTAL COLECTOMY WITH ILEORECTAL ANASTOMOSIS (N/A)  Given bowel movements, will d/c NG and start clear liquids. He will always have very loose, watery BM's  LOS: 12 days    Keerstin Bjelland A 10/11/2014

## 2014-10-11 NOTE — Plan of Care (Signed)
Problem: Phase II Progression Outcomes Goal: Discharge plan established Outcome: Completed/Met Date Met:  10/11/14 Discharge to SNF

## 2014-10-11 NOTE — Progress Notes (Signed)
[place] TEAM 1 - Stepdown/ICU TEAM Progress Note  [name] HUD:[id number] DOB: Jan 16, 1946 DOA: 09/29/2014 PCP: [name], MD  Admit HPI / Brief Narrative: [name] is a 70 y.o. 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 Presents with rectal bleeding.  Patient was recently hospitalized from 4/16 to 4/18 because of GI bleeding. He had EGD on 4/18, which was negative for active bleeding. Gastroenterology recommended proceeding with a colonoscopy, however since bleeding has resolved, patient did not agree. His Xarelto was restarted on 4/19. He started having rectal bleeding again today. He had 4 times of rectal bleeding with bright red blood per patient. He does not have lightheadedness, chest pain or shortness breath. Patient reports having severe abdominal pain over suprapubic area. He has mild nausea, but no vomiting or diarrhea. In ED, patient was found to have slight hemoglobin drop from 10.2 on 09/26/14 to 9.1. Temperature normal, no tachycardia, INR 1.79, electrolytes okay and positive FOBT. Patient is admitted to inpatient for further evaluation and treatment.  HPI/Subjective: 5/3 A/O 4, is states ate a small amount ice cream today negative N/V, positive gas. Request additional pain medication.   Assessment/Plan: Acute hypoxic respiratory failure  -Multifactorial to include COPD exacerbation, PCA overdose,+/- HCAP -Resolved -Continue albuterol nebulizer PRN -Continue DuoNeb QID -Continue Pulmicort nebulizer BID  HTN  -Continue Coreg 6.25 mg BID -Continue metoprolol PRN heart rate> 120  AFib with RVR (chronic) CHADS-VASc =2c -Currently in NSR  -See HTN Cont hold anticoagulation post op   Hyopnatremia/ hypokalemia -Improved continue to monitor closely -Replace lateral lites as required  Rectal bleeding due to Rectal mass -Resolved S/p subtotal  colectomy 4/27  Retroperitoneal bleed - small.  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 Marland KitchenTPN (CLINIMIX-E) Adult 83 mL/hr at 10/11/14 1744  . dextrose 5 % and 0.9% NaCl 10 mL/hr at 10/11/14 1500    Objective: VITAL SIGNS: Temp: 98.2 F (36.8 C) (05/03 1610) Temp Source: Oral (05/03 1610) BP: 137/63 mmHg (05/03 1813) Pulse Rate: 70 (05/03 1813) SPO2; FIO2:   Intake/Output Summary (Last 24 hours) at 10/11/14 1834 Last data filed at 10/11/14 1800  Gross per 24 hour  Intake 2475.61 ml  Output   2590 ml  Net -114.39 ml     Exam: General:  A/O 4, cachectic, poor appetite,No acute respiratory distress Lungs: Clear to auscultation bilaterally without wheezes or crackles Cardiovascular: Regular rate and rhythm without murmur gallop or rub normal S1 and S2 Abdomen: Nontender, nondistended, soft, bowel sounds positive, no rebound, no  ascites, no appreciable mass Extremities: No significant cyanosis, clubbing, or edema bilateral  lower extremities  Data Reviewed: Basic Metabolic Panel:  Recent Labs Lab 10/06/14 2343 10/07/14 0350 10/08/14 0424 10/09/14 0400 10/10/14 0400 10/11/14 0400  NA 134* 134* 133* 137 136 134*  K 4.2 4.1 3.5 3.2* 3.7 3.7  CL 99 100 100 101 104 101  CO2 25 25 25 27 23 23   GLUCOSE 248* 275* 139* 135* 175* 132*  BUN 15 15 18 12  21* 20  CREATININE 1.02 0.95 0.82 0.65 0.62 0.51*  CALCIUM 7.9* 7.9* 7.7* 7.7* 7.9* 7.9*  MG 1.4*  --  1.9 1.8 1.7 1.9  PHOS  --   --  2.5 3.3 4.2  --    Liver Function Tests:  Recent Labs Lab 10/06/14 0356 10/07/14 0350 10/09/14 0400 10/10/14 0400  AST 15 22 14* 15  ALT 12 15 15* 14*  ALKPHOS 62 54 55 54  BILITOT 0.4 0.5 0.4 0.6  PROT 5.0* 4.9* 4.8* 4.8*  ALBUMIN 2.3* 1.9* 1.6* 1.6*   No results for input(s): LIPASE, AMYLASE in the last 168 hours. No results for input(s): AMMONIA in the last 168 hours. CBC:  Recent Labs Lab 10/06/14 0356 10/07/14 0350 10/08/14 0424 10/09/14 0400 10/10/14 0845  WBC 8.2 12.3* 8.5 6.3 12.5*  HGB 9.1* 9.9* 7.9* 6.9* 12.9*  HCT 28.2* 29.3* 23.8* 20.9* 38.0*  MCV 96.2 94.2 92.6 93.3 91.3  PLT 111* 152 121* 105* 156   Cardiac Enzymes: No results for input(s): CKTOTAL, CKMB, CKMBINDEX, TROPONINI in the last 168 hours. BNP (last 3 results) No results for input(s): BNP in the last 8760 hours.  ProBNP (last 3 results) No results for input(s): PROBNP in the last 8760 hours.  CBG:  Recent Labs Lab 10/11/14 0001 10/11/14 0422 10/11/14 0728 10/11/14 1135 10/11/14 1557  GLUCAP 151* 128* 150* 167* 116*    Recent Results (from the past 240 hour(s))  Culture, respiratory (NON-Expectorated)     Status: None   Collection Time: 10/06/14  3:45 PM  Result Value Ref Range Status   Specimen Description TRACHEAL ASPIRATE  Final   Special Requests NONE  Final   Gram Stain   Final    ABUNDANT WBC PRESENT, PREDOMINANTLY PMN FEW SQUAMOUS EPITHELIAL CELLS PRESENT ABUNDANT GRAM POSITIVE COCCI IN PAIRS Performed  at [facility]    Culture   Final    ABUNDANT STREPTOCOCCUS PNEUMONIAE Performed at [facility]    Report Status 10/10/2014 FINAL  Final   Organism ID, Bacteria STREPTOCOCCUS PNEUMONIAE  Final      Susceptibility   Streptococcus pneumoniae - MIC (ETEST)*    CEFTRIAXONE 0.032 SENSITIVE Sensitive     LEVOFLOXACIN 0.75 SENSITIVE Sensitive     PENICILLIN 0.032 SENSITIVE Sensitive     * ABUNDANT STREPTOCOCCUS PNEUMONIAE     Studies:  Recent x-ray studies have been reviewed in detail by the Attending Physician  Scheduled Meds:  Scheduled Meds: . antiseptic oral rinse  7 mL Mouth Rinse QID  . budesonide  0.25 mg Nebulization Q12H  . calcium-vitamin D  1 tablet Per Tube Daily  . carvedilol  6.25 mg Oral BID WC  . chlorhexidine  15 mL Mouth Rinse BID  . famotidine (PEPCID) IV  20 mg Intravenous Q12H  . heparin subcutaneous  5,000 Units Subcutaneous 3 times per day  . insulin aspart  0-9 Units Subcutaneous 6 times per day  . ipratropium-albuterol  3 mL Nebulization QID  . sodium chloride  10-40 mL  Intracatheter Q12H  . sodium chloride  3 mL Intravenous Q12H    Time spent on care of this patient: 40 mins   Shakura Cowing, Geraldo Docker , MD  [facility] Office  [phone] Pager - [phone]  On-Call/Text Page:      [email or web address]      password [email or web address]  If 7PM-7AM, please contact night-coverage [email or web address] Password [facility] 10/11/2014, 6:34 PM   LOS: 12 days   Care during the described time interval was provided by me .  I have reviewed this patient's available data, including medical history, events of note, physical examination, radiology studies and test results as part of my evaluation  Dia Crawford, MD [phone] Pager

## 2014-10-12 ENCOUNTER — Encounter (HOSPITAL_COMMUNITY): Payer: Self-pay | Admitting: Surgery

## 2014-10-12 DIAGNOSIS — J9621 Acute and chronic respiratory failure with hypoxia: Secondary | ICD-10-CM

## 2014-10-12 DIAGNOSIS — D62 Acute posthemorrhagic anemia: Secondary | ICD-10-CM

## 2014-10-12 DIAGNOSIS — E871 Hypo-osmolality and hyponatremia: Secondary | ICD-10-CM

## 2014-10-12 LAB — GLUCOSE, CAPILLARY
GLUCOSE-CAPILLARY: 132 mg/dL — AB (ref 70–99)
GLUCOSE-CAPILLARY: 117 mg/dL — AB (ref 70–99)
Glucose-Capillary: 139 mg/dL — ABNORMAL HIGH (ref 70–99)
Glucose-Capillary: 126 mg/dL — ABNORMAL HIGH (ref 70–99)
Glucose-Capillary: 121 mg/dL — ABNORMAL HIGH (ref 70–99)
Glucose-Capillary: 142 mg/dL — ABNORMAL HIGH (ref 70–99)

## 2014-10-12 LAB — COMPREHENSIVE METABOLIC PANEL
ALK PHOS: 70 U/L (ref 38–126)
ALT: 36 U/L (ref 17–63)
AST: 34 U/L (ref 15–41)
Albumin: 1.8 g/dL — ABNORMAL LOW (ref 3.5–5.0)
Anion gap: 7 (ref 5–15)
BUN: 20 mg/dL (ref 6–20)
CALCIUM: 7.8 mg/dL — AB (ref 8.9–10.3)
CO2: 23 mmol/L (ref 22–32)
Chloride: 99 mmol/L — ABNORMAL LOW (ref 101–111)
Creatinine, Ser: 0.52 mg/dL — ABNORMAL LOW (ref 0.61–1.24)
GFR calc Af Amer: >60 mL/min (ref >60)
GFR calc non Af Amer: >60 mL/min (ref >60)
Glucose, Bld: 148 mg/dL — ABNORMAL HIGH (ref 70–99)
POTASSIUM: 4.3 mmol/L (ref 3.5–5.1)
Sodium: 129 mmol/L — ABNORMAL LOW (ref 135–145)
TOTAL PROTEIN: 5.2 g/dL — AB (ref 6.5–8.1)
Total Bilirubin: 0.5 mg/dL (ref 0.3–1.2)

## 2014-10-12 LAB — CBC WITH DIFFERENTIAL/PLATELET
BASOS ABS: 0 10*3/uL (ref 0.0–0.1)
Basophils Relative: 0 % (ref 0–1)
Eosinophils Absolute: 0.2 10*3/uL (ref 0.0–0.7)
Eosinophils Relative: 2 % (ref 0–5)
HEMATOCRIT: 32.3 % — AB (ref 39.0–52.0)
Hemoglobin: 10.7 g/dL — ABNORMAL LOW (ref 13.0–17.0)
Lymphocytes Relative: 21 % (ref 12–46)
Lymphs Abs: 2.4 10*3/uL (ref 0.7–4.0)
MCH: 30.7 pg (ref 26.0–34.0)
MCHC: 33.1 g/dL (ref 30.0–36.0)
MCV: 92.6 fL (ref 78.0–100.0)
Monocytes Absolute: 0.8 10*3/uL (ref 0.1–1.0)
Monocytes Relative: 7 % (ref 3–12)
NEUTROS ABS: 8.2 10*3/uL — AB (ref 1.7–7.7)
NEUTROS PCT: 71 % (ref 43–77)
Platelets: 167 10*3/uL (ref 150–400)
RBC: 3.49 MIL/uL — ABNORMAL LOW (ref 4.22–5.81)
RDW: 14 % (ref 11.5–15.5)
WBC: 11.6 10*3/uL — AB (ref 4.0–10.5)

## 2014-10-12 LAB — MAGNESIUM: Magnesium: 1.9 mg/dL (ref 1.7–2.4)

## 2014-10-12 LAB — PHOSPHORUS: Phosphorus: 4.1 mg/dL (ref 2.5–4.6)

## 2014-10-12 MED ORDER — TRACE MINERALS CR-CU-F-FE-I-MN-MO-SE-ZN IV SOLN
INTRAVENOUS | Status: AC
  Administered 2014-10-12: 18:00:00 via INTRAVENOUS
  Filled 2014-10-12: qty 1992

## 2014-10-12 MED ORDER — LEVALBUTEROL HCL 0.63 MG/3ML IN NEBU
0.6300 mg | INHALATION_SOLUTION | RESPIRATORY_TRACT | Status: DC | PRN
  Filled 2014-10-12: qty 3

## 2014-10-12 MED ORDER — MAGNESIUM SULFATE IN D5W 10-5 MG/ML-% IV SOLN
1.0000 g | Freq: Once | INTRAVENOUS | Status: AC
  Administered 2014-10-12: 1 g via INTRAVENOUS
  Filled 2014-10-12 (×2): qty 100

## 2014-10-12 MED ORDER — IPRATROPIUM BROMIDE 0.02 % IN SOLN
0.5000 mg | Freq: Four times a day (QID) | RESPIRATORY_TRACT | Status: DC
  Administered 2014-10-12 – 2014-10-14 (×6): 0.5 mg via RESPIRATORY_TRACT
  Filled 2014-10-12 (×6): qty 2.5

## 2014-10-12 MED ORDER — FENTANYL CITRATE (PF) 100 MCG/2ML IJ SOLN
50.0000 ug | INTRAMUSCULAR | Status: DC | PRN
  Administered 2014-10-12 (×2): 75 ug via INTRAVENOUS
  Administered 2014-10-12: 50 ug via INTRAVENOUS
  Administered 2014-10-12 – 2014-10-14 (×13): 75 ug via INTRAVENOUS
  Filled 2014-10-12 (×16): qty 2

## 2014-10-12 MED ORDER — LEVALBUTEROL HCL 0.63 MG/3ML IN NEBU
0.6300 mg | INHALATION_SOLUTION | Freq: Four times a day (QID) | RESPIRATORY_TRACT | Status: DC
  Administered 2014-10-12 – 2014-10-13 (×3): 0.63 mg via RESPIRATORY_TRACT
  Filled 2014-10-12 (×7): qty 3

## 2014-10-12 NOTE — Progress Notes (Signed)
Physical Therapy Treatment Patient Details Name: [name] MRN: [id number] DOB: 06/14/1945 Today's Date: 10/12/2014    History of Present Illness Pt is a 69 y.o. 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 Pt underwent subtotal colectomy 4/27 and was emergently intubated on 4/28 when pt developed acute respiratory distress. He was extubated 4/30.    PT Comments    Pt initially refusing all activity. Agreed to sit EOB if he didn't have to sit in recliner. Agreed to stand from EOB and pt incontinent of liquid stool. Stood again for 2 minutes for pericare and pivoted to chair for sheets to be changed. Pt with increasing restlessness/agitation and did not want to stay in chair. For his safety, assisted him back to bed. Mobilizing much better (only required 1 person assist).    Follow Up Recommendations  Home health PT;Supervision/Assistance - 24 hour     Equipment Recommendations  None recommended by PT    Recommendations for Other Services       Precautions / Restrictions Precautions Precautions: Fall Restrictions Weight Bearing Restrictions: No    Mobility  Bed Mobility Overal bed mobility: Needs Assistance Bed Mobility: Rolling;Sidelying to Sit;Sit to Sidelying Rolling: Min assist Sidelying to sit: Min assist;HOB elevated     Sit to sidelying: Min assist General bed mobility comments: pt again trying to pull up to sitting from supine, however was able to redirect him to roll and push up from his side; max cues for return to his side  Transfers Overall transfer level: Needs assistance Equipment used: 1 person hand held assist Transfers: Sit to/from [facility] Sit to Stand: Min assist;+2 safety/equipment Stand pivot transfers: Min assist;+2 safety/equipment       General transfer comment: Assist to power-up to  full standing position and to take pivotal steps around to the recliner chair. Pt was provided assist for balance and general support. to/from recliner as pt refused to stay out of bed  Ambulation/Gait             General Gait Details: Deferred as pt is at a transfers-only level at home. Wheelchair mobile.    Stairs            Wheelchair Mobility    Modified Rankin (Stroke Patients Only)       Balance   Sitting-balance support: No upper extremity supported;Feet supported Sitting balance-Leahy Scale: Fair     Standing balance support: Bilateral upper extremity supported Standing balance-Leahy Scale: Poor Standing balance comment: leans posteriorly on initial standing                    Cognition Arousal/Alertness: Awake/alert Behavior During Therapy: WFL for tasks assessed/performed Overall Cognitive Status: Impaired/Different from baseline Area of Impairment: Orientation;Awareness Orientation Level: Time (thought it was the middle of the night)   Memory: Decreased short-term memory     Awareness: Intellectual   General Comments: even after re-oriented multiple times, pt insisted it was night    Exercises      General Comments General comments (skin integrity, edema, etc.): Educated on importance of incr activity and risks of inactivity; pt continued to refuse to stay OOB. For pt's safety, returned to bed.      Pertinent Vitals/Pain Pain Assessment: Faces Faces Pain Scale: Hurts even more Pain Location: abd Pain Descriptors / Indicators: Burning Pain Intervention(s): Limited activity within patient's tolerance;Monitored during session;Premedicated  before session;Repositioned;Patient requesting pain meds-RN notified    Home Living                      Prior Function            PT Goals (current goals can now be found in the care plan section) Acute Rehab PT Goals Patient Stated Goal: to go back to ALF Time For Goal Achievement:  10/24/14 Progress towards PT goals: Progressing toward goals    Frequency  Min 3X/week    PT Plan Current plan remains appropriate    Co-evaluation             End of Session Equipment Utilized During Treatment: Gait belt Activity Tolerance: Patient limited by pain;Treatment limited secondary to agitation Patient left: with call bell/phone within reach;in bed;with bed alarm set;with SCD's reapplied     Time: 1057-1120 PT Time Calculation (min) (ACUTE ONLY): 23 min  Charges:  $Therapeutic Activity: 23-37 mins                    G Codes:      [name] 2014/11/08, 11:35 AM Pager [phone]

## 2014-10-12 NOTE — Progress Notes (Signed)
7 Days Post-Op  Subjective: Tolerated clears with some heart burn but no nausea or bloating  Objective: Vital signs in last 24 hours: Temp:  [97.4 F (36.3 C)-98.9 F (37.2 C)] 97.4 F (36.3 C) (05/04 0735) Pulse Rate:  [38-100] 75 (05/04 0840) Resp:  [13-30] 13 (05/04 0840) BP: (115-151)/(53-68) 138/58 mmHg (05/04 0840) SpO2:  [95 %-100 %] 100 % (05/04 0840) Weight:  [51.03 kg (112 lb 8 oz)] 51.03 kg (112 lb 8 oz) (05/04 0300) Last BM Date: 10/10/14  Intake/Output from previous day: 05/03 0701 - 05/04 0700 In: 2342.6 [I.V.:185; IV Piggyback:250; TPN:1907.6] Out: 1583 [Urine:3850] Intake/Output this shift: Total I/O In: 93 [I.V.:10; TPN:83] Out: 175 [Urine:175]  Abdomen is soft, non-distended Incision is clean  Lab Results:   Recent Labs  10/10/14 0845 10/12/14 0334  WBC 12.5* 11.6*  HGB 12.9* 10.7*  HCT 38.0* 32.3*  PLT 156 167   BMET  Recent Labs  10/11/14 0400 10/12/14 0334  NA 134* 129*  K 3.7 4.3  CL 101 99*  CO2 23 23  GLUCOSE 132* 148*  BUN 20 20  CREATININE 0.51* 0.52*  CALCIUM 7.9* 7.8*   PT/INR No results for input(s): LABPROT, INR in the last 72 hours. ABG No results for input(s): PHART, HCO3 in the last 72 hours.  Invalid input(s): PCO2, PO2  Studies/Results: No results found.  Anti-infectives: Anti-infectives    Start     Dose/Rate Route Frequency Ordered Stop   10/06/14 1445  piperacillin-tazobactam (ZOSYN) IVPB 3.375 g  Status:  Discontinued     3.375 g 12.5 mL/hr over 240 Minutes Intravenous 3 times per day 10/06/14 1443 10/10/14 1232   10/05/14 1117  cefoTEtan in Dextrose 5% (CEFOTAN) 2-2.08 GM-% IVPB    Comments:  Ronnald Ramp, Tomika   : cabinet override      10/05/14 1117 10/05/14 2329   10/05/14 1045  cefoTEtan (CEFOTAN) 2 g in dextrose 5 % 50 mL IVPB     2 g 100 mL/hr over 30 Minutes Intravenous 30 min pre-op 10/04/14 1548 10/05/14 1140      Assessment/Plan: s/p Procedure(s): SUBTOTAL COLECTOMY WITH ILEORECTAL  ANASTOMOSIS (N/A)  Will keep on clear liquids today per patient request Otherwise, he is slowly improving  LOS: 13 days    Aubriana Ravelo A 10/12/2014

## 2014-10-12 NOTE — Progress Notes (Addendum)
NUTRITION FOLLOW UP  DOCUMENTATION CODES Per approved criteria  -Severe malnutrition in the context of chronic illness -Underweight   INTERVENTION:  TPN per pharmacy RD to follow for nutrition care plan  NUTRITION DIAGNOSIS: Inadequate oral intake now related to altered GI function as evidenced by continued TPN, ongoing  Goal: TPN meet >/= 90% of their estimated nutrition needs, met  Monitor:  TPN prescription, PO intake, weight, labs, I/O's  ASSESSMENT: [name] is a 69 y.o. male who had presented with rectal bleeding. Masses found in cecum/rectum s/p subtotal colectomy with ileorectal anastomosis. 4/28 developed acute resp distress and intubated. Found to have PNA.  Patient s/p procedures 4/27: SUBTOTAL COLECTOMY WITH ILEORECTAL ANASTOMOSIS  RD consulted 4/30 for TPN initiation d/t bowel rest and likelihood of prolonged NPO status.   Pt advanced to Clear Liquids 5/3.  No diet advancement today per Surgery (pt requested).  Patient is receiving TPN with Clinimix E 5/15 @ 83 ml/hr.  Lipids are currently being held.  Provides 1414 kcal and 100 grams protein per day. Meets 94% minimum estimated energy needs and 100% minimum estimated protein needs.  Height: Ht Readings from Last 1 Encounters:  09/30/14 _0  (1.753 m)    Weight: Wt Readings from Last 1 Encounters:  10/12/14 112 lb 8 oz (51.03 kg)    BMI:  Body mass index is 16.61 kg/(m^2).  Re-estimated Nutritional Needs: Kcal: 1500-1700 Protein: 80-90 gm Fluid: 1.5-1.7 L  Skin: surgical wounds, abrasions, dry/cracking skin  Diet Order: Diet clear liquid Room service appropriate?: Yes; Fluid consistency:: Thin .TPN (CLINIMIX-E) Adult .TPN (CLINIMIX-E) Adult   Intake/Output Summary (Last 24 hours) at 10/12/14 1139 Last data filed at 10/12/14 0800  Gross per 24 hour  Intake 2023.61 ml  Output   3575 ml  Net -1551.39 ml    Last BM:  5/4  Labs:   Recent Labs Lab 10/09/14 0400 10/10/14 0400  10/11/14 0400 10/12/14 0334  NA 137 136 134* 129*  K 3.2* 3.7 3.7 4.3  CL 101 104 101 99*  CO2 _1 BUN 12 21* 20 20  CREATININE 0.65 0.62 0.51* 0.52*  CALCIUM 7.7* 7.9* 7.9* 7.8*  MG 1.8 1.7 1.9 1.9  PHOS 3.3 4.2  --  4.1  GLUCOSE 135* 175* 132* 148*    CBG (last 3)   Recent Labs  10/11/14 2328 10/12/14 0328 10/12/14 0733  GLUCAP 139* 139* 132*    Scheduled Meds: . antiseptic oral rinse  7 mL Mouth Rinse QID  . budesonide  0.25 mg Nebulization Q12H  . calcium-vitamin D  1 tablet Per Tube Daily  . carvedilol  6.25 mg Oral BID WC  . chlorhexidine  15 mL Mouth Rinse BID  . famotidine (PEPCID) IV  20 mg Intravenous Q12H  . heparin subcutaneous  5,000 Units Subcutaneous 3 times per day  . insulin aspart  0-9 Units Subcutaneous 6 times per day  . ipratropium-albuterol  3 mL Nebulization QID  . magnesium sulfate 1 - 4 g bolus IVPB  1 g Intravenous Once  . sodium chloride  10-40 mL Intracatheter Q12H  . sodium chloride  3 mL Intravenous Q12H    Continuous Infusions: . Marland KitchenTPN (CLINIMIX-E) Adult 83 mL/hr at 10/12/14 0800  . Marland KitchenTPN (CLINIMIX-E) Adult    . dextrose 5 % and 0.9% NaCl 10 mL/hr at 10/12/14 0800    Past Medical History  Diagnosis Date  . Depression   . Hypomagnesemia   . Dyspepsia   . Fall  at nursing home 05/27/2013    "slipped on water; said I broke my right hip" (05/27/2013)  . Migraine     "used to have them bad; haven't had one in awhile" (05/27/2013)  . Gout attack     "not too long ago; in my right foot" (05/27/2013)  . Complete heart block     reason for pacemaker/notes 05/27/2013  . Atrial fibrillation     [name] 05/27/2013  . HLD (hyperlipidemia)   . GIB (gastrointestinal bleeding)   . BPH (benign prostatic hyperplasia)     Past Surgical History  Procedure Laterality Date  . Insert / replace / remove pacemaker    . Appendectomy    . Cholecystectomy    . Open reduction of hip Right 05/28/2013    Procedure: OPEN REDUCTION OF HIP;   Surgeon: Renette Butters, MD;  Location: [facility];  Service: Orthopedics;  Laterality: Right;  . Esophagogastroduodenoscopy (egd) with propofol N/A 09/26/2014    Procedure: ESOPHAGOGASTRODUODENOSCOPY (EGD) WITH PROPOFOL;  Surgeon: Jerene Bears, MD;  Location[facility] ENDOSCOPY;  Service: Endoscopy;  Laterality: N/A;  . Colonoscopy N/A 10/01/2014    Procedure: COLONOSCOPY;  Surgeon: Jerene Bears, MD;  Location: [facility] ENDOSCOPY;  Service: Endoscopy;  Laterality: N/A;  . Partial colectomy N/A 10/05/2014    Procedure: SUBTOTAL COLECTOMY WITH ILEORECTAL ANASTOMOSIS;  Surgeon: Donnie Mesa, MD;  Location: [place];  Service: General;  Laterality: N/A;    Arthur Holms, RD, LDN Pager #: [phone] After-Hours Pager #: [phone]

## 2014-10-12 NOTE — Progress Notes (Signed)
PARENTERAL NUTRITION CONSULT NOTE   Pharmacy Consult for TPN Indication: NPO, PCM  No Known Allergies  Patient Measurements: Height: 5\' 9"  (175.3 cm) Weight: 112 lb 8 oz (51.03 kg) IBW/kg (Calculated) : 70.7  Intake/Output from previous day: 05/03 0701 - 05/04 0700 In: 2342.6 [I.V.:185; IV Piggyback:250; TPN:1907.6] Out: 3850 [Urine:3850] Intake/Output from this shift:    Labs:  Recent Labs  10/10/14 0845 10/12/14 0334  WBC 12.5* 11.6*  HGB 12.9* 10.7*  HCT 38.0* 32.3*  PLT 156 167     Recent Labs  10/09/14 1405 10/10/14 0400 10/11/14 0400 10/12/14 0334  NA  --  136 134* 129*  K  --  3.7 3.7 4.3  CL  --  104 101 99*  CO2  --  23 23 23   GLUCOSE  --  175* 132* 148*  BUN  --  21* 20 20  CREATININE  --  0.62 0.51* 0.52*  CALCIUM  --  7.9* 7.9* 7.8*  MG  --  1.7 1.9 1.9  PHOS  --  4.2  --  4.1  PROT  --  4.8*  --  5.2*  ALBUMIN  --  1.6*  --  1.8*  AST  --  15  --  34  ALT  --  14*  --  36  ALKPHOS  --  54  --  70  BILITOT  --  0.6  --  0.5  PREALBUMIN  --  6.6*  --   --   TRIG 78 101  --   --    Estimated Creatinine Clearance: 64.6 mL/min (by C-G formula based on Cr of 0.52).    Recent Labs  10/11/14 1946 10/11/14 2328 10/12/14 0328  GLUCAP 133* 139* 139*    Insulin Requirements in the past 24 hours:  5 units ssi since rate increased to 83 Surgeries/Procedures: 4/27  Subtotal colectomy with ileorectal anastomosis GI:  POD #7, having BMs, NGT out 5/3 and started clear liquids 5/3, no intake recorded, but per MD note ate a small amount of ice cream 5/3. No N/V.   Alb 1.8 prealbumin 3.7>6.6; Endo: cbgs 116-167, 5 units SSI given; goal < 180 in ICU pt Lytes: Na down to 129,  K 4.3 after 3 runs yesterday  (goal >4 with afib)  Mag 1.9 after 1 gm yesterday  (goal >2 ), phos 4.1, Ca 7.8, CoCa 9.56    Renal: SrCr stable, UOP 2.9 ml/kg/hr Pulm: became hypoxic and transferred to ICU, now extubated and on RA  Cards:afib,  Off amio drip Hepatobil: LFTs ok,  TG 101 Neuro: sedation off  ID:  Zosyn  4/28>>5/2     WBC 6.3>>12.5>11.6 , AF TPN Access: CVC LIJ 4/28 TPN start date:  4/30  Current Nutrition:  Clear liq 5/3 Clinimix E 5/15 at 83 ml/hr + IVFE at 10 ml/hr  Assessment: 69 yo man s/p Subtotal colectomy with ileorectal anastomosis for cecal wall mass and rectal mass on TPN for NPO status and PCM.  Nutritional Goals:  Kcal 1700-1950, protein >97 g Clinimix E 5/15 at goal rate 83 ml/hr and 20% lipids @ 10 ml/hr will provide 99 gm protein and 1916 Kcal/day  Plan:  -continue Clinimix E 5/15 at goal rate of 83 ml/hr which provides 99.6 gm protein (100% goal) and 1414 Kcals (~ 83 % Kcal goal). -Will withhold lipids x 7 days while pt in ICU  per ASPEN guideline - repeat magnesium 1 gm bolus,TPN labs in am -continue SSI -f/u tolerance of liquid  diet  Eudelia Bunch, Pharm.D. [phone] 10/12/2014 8:00 AM

## 2014-10-12 NOTE — Progress Notes (Signed)
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  Patient had been hospitalized from 4/16 - 4/18 because of GI bleeding. EGD 4/18 was negative for active bleeding. Pt refused colonoscopy at that time. His Xarelto was restarted on 4/19. He started having bright red rectal bleeding again 4/21.   In the ED he was found to have slight hemoglobin drop (10.2 on 09/26/14 to 9.1). Temperature normal, no tachycardia, INR 1.79, electrolytes okay and positive FOBT.   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   He denies nausea or vomiting , shortness of breath, chest pain , or headache. Nursing reports that he has been reluctant to progress his activity.  Assessment/Plan:  Acute hypoxic respiratory failure  -Multifactorial to include COPD exacerbation, PCA overdose, HCAP -Resolved  Left lower lobe Strep pneumo HCAP -respiratory culture positive -  has completed course of antibiotic therapy  HTN  -reasonably well controlled at present - follow w/o change today   AFib with RVR (chronic)  -rate controlled  -CHADS-VASc is 2 - anticoagulation on hold due to bleeding issues noted below  Hyopnatremia / hypokalemia -correct via TNA as indicated - follow sodium trend - no evidence of volume overload on physical exam  Rectal bleeding due to Rectal mass -  s/p subtotal colectomy with ileorectal anastomosis -Postop care per general surgery  Retroperitoneal bleed - small -continue to follow serial CBC to assure hemoglobin stable  Protein calorie malnutrition -Diet/TNA per general surgery  Acute blood loss Anemia -due to GI bleeding as well as retroperitoneal bleeding - following serial CBC  Thrombocytopenia  -steadily improving  AMS -Resolved   Probable previously undiagnosed COPD  -will need outpatient follow-up with PFTs   Code Status: FULL Family Communication: no family present at time of exam Disposition Plan:  medically stable for transfer to med/surg bed (chronic atrial fibrillation now well rate controlled) -  advance activity -  Diet management per general surgery -  patient resides in SNF therefore plan will be to return to same  Consultants: Gen Surg PCCM  Antibiotics: Zosyn 4/28 > 5/2  DVT prophylaxis: Subcutaneous heparin  Objective: Blood pressure 138/58, pulse 75, temperature 97.4 F (36.3 C), temperature source Oral, resp. rate 13, height 5\' 9"  (1.753 m), weight 51.03 kg (112 lb 8 oz), SpO2 100 %.  Intake/Output Summary (Last 24 hours) at 10/12/14 1134 Last data filed at 10/12/14 0800  Gross per 24 hour  Intake 2023.61 ml  Output   3575 ml  Net -1551.39 ml   Exam: General:  A/O 4, cachectic, no acute respiratory distress Lungs: Clear to auscultation bilaterally without wheezes or crackles - distant BS th/o  Cardiovascular: Regular rate without murmur gallop or rub - no JVD Abdomen: Nontender, nondistended, soft, wound/staples intact w/o erythema, no ascites, no appreciable mass Extremities: No significant cyanosis, clubbing, or edema bilateral lower extremities  Data Reviewed: Basic Metabolic Panel:  Recent Labs Lab 10/08/14 0424 10/09/14 0400 10/10/14 0400 10/11/14  0400 10/12/14 0334  NA 133* 137 136 134* 129*  K 3.5 3.2* 3.7 3.7 4.3  CL 100 101 104 101 99*  CO2 25 27 23 23 23     GLUCOSE 139* 135* 175* 132* 148*  BUN 18 12 21* 20 20  CREATININE 0.82 0.65 0.62 0.51* 0.52*  CALCIUM 7.7* 7.7* 7.9* 7.9* 7.8*  MG 1.9 1.8 1.7 1.9 1.9  PHOS 2.5 3.3 4.2  --  4.1   Liver Function Tests:  Recent Labs Lab 10/06/14 0356 10/07/14 0350 10/09/14 0400 10/10/14 0400 10/12/14 0334  AST 15 22 14* 15 34  ALT 12 15 15* 14* 36  ALKPHOS 62 54 55 54 70  BILITOT 0.4 0.5 0.4 0.6 0.5  PROT 5.0* 4.9* 4.8* 4.8* 5.2*  ALBUMIN 2.3* 1.9* 1.6* 1.6* 1.8*   CBC:  Recent Labs Lab 10/07/14 0350 10/08/14 0424 10/09/14 0400 10/10/14 0845 10/12/14 0334  WBC 12.3* 8.5 6.3 12.5* 11.6*  NEUTROABS  --   --   --   --  8.2*  HGB 9.9* 7.9* 6.9* 12.9* 10.7*  HCT 29.3* 23.8* 20.9* 38.0* 32.3*  MCV 94.2 92.6 93.3 91.3 92.6  PLT 152 121* 105* 156 167   CBG:  Recent Labs Lab 10/11/14 1557 10/11/14 1946 10/11/14 2328 10/12/14 0328 10/12/14 0733  GLUCAP 116* 133* 139* 139* 132*    Recent Results (from the past 240 hour(s))  Culture, respiratory (NON-Expectorated)     Status: None   Collection Time: 10/06/14  3:45 PM  Result Value Ref Range Status   Specimen Description TRACHEAL ASPIRATE  Final   Special Requests NONE  Final   Gram Stain   Final    ABUNDANT WBC PRESENT, PREDOMINANTLY PMN FEW SQUAMOUS EPITHELIAL CELLS PRESENT ABUNDANT GRAM POSITIVE COCCI IN PAIRS Performed at [facility]    Culture   Final    ABUNDANT STREPTOCOCCUS PNEUMONIAE Performed at [facility]    Report Status 10/10/2014 FINAL  Final   Organism ID, Bacteria STREPTOCOCCUS PNEUMONIAE  Final      Susceptibility   Streptococcus pneumoniae - MIC (ETEST)*    CEFTRIAXONE 0.032 SENSITIVE Sensitive     LEVOFLOXACIN 0.75 SENSITIVE Sensitive     PENICILLIN 0.032 SENSITIVE Sensitive     * ABUNDANT STREPTOCOCCUS PNEUMONIAE     Studies:  Recent x-ray studies have been reviewed in detail by the Attending Physician  Scheduled Meds:  Scheduled Meds: . antiseptic oral rinse  7 mL Mouth  Rinse QID  . budesonide  0.25 mg Nebulization Q12H  . calcium-vitamin D  1 tablet Per Tube Daily  . carvedilol  6.25 mg Oral BID WC  . chlorhexidine  15 mL Mouth Rinse BID  . famotidine (PEPCID) IV  20 mg Intravenous Q12H  . heparin subcutaneous  5,000 Units Subcutaneous 3 times per day  . insulin aspart  0-9 Units Subcutaneous 6 times per day  . ipratropium-albuterol  3 mL Nebulization QID  . magnesium sulfate 1 - 4 g bolus IVPB  1 g Intravenous Once  . sodium chloride  10-40 mL Intracatheter Q12H  . sodium chloride  3 mL Intravenous Q12H    Time spent on care of this patient: 35 mins  Cherene Altes, MD [facility] For Consults/Admissions - Flow Manager - [phone] Office  [phone]  Contact MD directly via text page:      [email or web address]      password [facility]  10/12/2014, 11:34 AM   LOS: 13 days

## 2014-10-13 ENCOUNTER — Inpatient Hospital Stay (HOSPITAL_COMMUNITY): Payer: Medicare Other

## 2014-10-13 DIAGNOSIS — J690 Pneumonitis due to inhalation of food and vomit: Secondary | ICD-10-CM | POA: Diagnosis present

## 2014-10-13 DIAGNOSIS — J154 Pneumonia due to other streptococci: Secondary | ICD-10-CM

## 2014-10-13 LAB — GLUCOSE, CAPILLARY
GLUCOSE-CAPILLARY: 142 mg/dL — AB (ref 70–99)
GLUCOSE-CAPILLARY: 102 mg/dL — AB (ref 70–99)
Glucose-Capillary: 148 mg/dL — ABNORMAL HIGH (ref 70–99)
Glucose-Capillary: 148 mg/dL — ABNORMAL HIGH (ref 70–99)
Glucose-Capillary: 142 mg/dL — ABNORMAL HIGH (ref 70–99)

## 2014-10-13 LAB — CBC
HCT: 32.7 % — ABNORMAL LOW (ref 39.0–52.0)
Hemoglobin: 10.8 g/dL — ABNORMAL LOW (ref 13.0–17.0)
MCH: 30.7 pg (ref 26.0–34.0)
MCHC: 33 g/dL (ref 30.0–36.0)
MCV: 92.9 fL (ref 78.0–100.0)
Platelets: 175 10*3/uL (ref 150–400)
RBC: 3.52 MIL/uL — ABNORMAL LOW (ref 4.22–5.81)
RDW: 14.1 % (ref 11.5–15.5)
WBC: 10.2 10*3/uL (ref 4.0–10.5)

## 2014-10-13 LAB — MAGNESIUM: Magnesium: 1.9 mg/dL (ref 1.7–2.4)

## 2014-10-13 LAB — BASIC METABOLIC PANEL
ANION GAP: 7 (ref 5–15)
BUN: 22 mg/dL — ABNORMAL HIGH (ref 6–20)
CO2: 24 mmol/L (ref 22–32)
Calcium: 8.1 mg/dL — ABNORMAL LOW (ref 8.9–10.3)
Chloride: 100 mmol/L — ABNORMAL LOW (ref 101–111)
Creatinine, Ser: 0.5 mg/dL — ABNORMAL LOW (ref 0.61–1.24)
GFR calc Af Amer: >60 mL/min (ref >60)
GLUCOSE: 168 mg/dL — AB (ref 70–99)
POTASSIUM: 4.5 mmol/L (ref 3.5–5.1)
Sodium: 131 mmol/L — ABNORMAL LOW (ref 135–145)

## 2014-10-13 LAB — PHOSPHORUS: Phosphorus: 4.5 mg/dL (ref 2.5–4.6)

## 2014-10-13 LAB — PREALBUMIN: PREALBUMIN: 13.8 mg/dL — AB (ref 18–38)

## 2014-10-13 MED ORDER — HYDROCODONE-ACETAMINOPHEN 5-325 MG PO TABS
1.0000 | ORAL_TABLET | ORAL | Status: DC | PRN
Start: 2014-10-13 — End: 2014-10-14
  Administered 2014-10-13 (×2): 2 via ORAL
  Filled 2014-10-13 (×2): qty 2

## 2014-10-13 MED ORDER — ACETAMINOPHEN 325 MG PO TABS: 325.0000 mg | ORAL_TABLET | Freq: Four times a day (QID) | ORAL | Status: DC | PRN

## 2014-10-13 MED ORDER — MAGNESIUM SULFATE 2 GM/50ML IV SOLN
2.0000 g | Freq: Once | INTRAVENOUS | Status: AC
  Administered 2014-10-13: 2 g via INTRAVENOUS
  Filled 2014-10-13: qty 50

## 2014-10-13 MED ORDER — TRACE MINERALS CR-CU-F-FE-I-MN-MO-SE-ZN IV SOLN
INTRAVENOUS | Status: AC
  Administered 2014-10-13: 18:00:00 via INTRAVENOUS
  Filled 2014-10-13: qty 1992

## 2014-10-13 MED ORDER — LEVALBUTEROL HCL 0.63 MG/3ML IN NEBU
0.6300 mg | INHALATION_SOLUTION | Freq: Four times a day (QID) | RESPIRATORY_TRACT | Status: DC
  Administered 2014-10-13 – 2014-10-14 (×3): 0.63 mg via RESPIRATORY_TRACT
  Filled 2014-10-13 (×2): qty 3

## 2014-10-13 NOTE — Progress Notes (Signed)
UR Completed.  [phone]  

## 2014-10-13 NOTE — Progress Notes (Signed)
PARENTERAL NUTRITION CONSULT NOTE - FOLLOW UP  Pharmacy Consult for TPN Indication: NPO, PCM  No Known Allergies  Patient Measurements: Height: 5\' 9"  (175.3 cm) Weight: 112 lb 8 oz (51.03 kg) IBW/kg (Calculated) : 70.7 Adjusted Body Weight:  Usual Weight:   Vital Signs: Temp: 98.5 F (36.9 C) (05/05 0700) Temp Source: Oral (05/05 0700) BP: 132/60 mmHg (05/05 0800) Pulse Rate: 72 (05/05 0810) Intake/Output from previous day: 05/04 0701 - 05/05 0700 In: 2250.4 [I.V.:240; IV Piggyback:100; TPN:1910.4] Out: 2975 [Urine:2975] Intake/Output from this shift: Total I/O In: 93 [I.V.:10; TPN:83] Out: 150 [Urine:150]  Labs:  Recent Labs  10/12/14 0334 10/13/14 0420  WBC 11.6* 10.2  HGB 10.7* 10.8*  HCT 32.3* 32.7*  PLT 167 175     Recent Labs  10/11/14 0400 10/12/14 0334 10/13/14 0420  NA 134* 129* 131*  K 3.7 4.3 4.5  CL 101 99* 100*  CO2 23 23 24   GLUCOSE 132* 148* 168*  BUN 20 20 22*  CREATININE 0.51* 0.52* 0.50*  CALCIUM 7.9* 7.8* 8.1*  MG 1.9 1.9 1.9  PHOS  --  4.1 4.5  PROT  --  5.2*  --   ALBUMIN  --  1.8*  --   AST  --  34  --   ALT  --  36  --   ALKPHOS  --  70  --   BILITOT  --  0.5  --   PREALBUMIN  --   --  13.8*   Estimated Creatinine Clearance: 64.6 mL/min (by C-G formula based on Cr of 0.5).    Recent Labs  10/12/14 2321 10/13/14 0323 10/13/14 0747  GLUCAP 142* 148* 142*    Medications:  Scheduled:  . antiseptic oral rinse  7 mL Mouth Rinse QID  . budesonide  0.25 mg Nebulization Q12H  . calcium-vitamin D  1 tablet Per Tube Daily  . carvedilol  6.25 mg Oral BID WC  . chlorhexidine  15 mL Mouth Rinse BID  . famotidine (PEPCID) IV  20 mg Intravenous Q12H  . heparin subcutaneous  5,000 Units Subcutaneous 3 times per day  . insulin aspart  0-9 Units Subcutaneous 6 times per day  . ipratropium  0.5 mg Nebulization Q6H  . levalbuterol  0.63 mg Nebulization QID    Insulin Requirements in the past 24 hours:  5 units Sens SSI, no  insulin in TPN  Surgeries/Procedures: 4/27 Subtotal colectomy with ileorectal anastomosis  GI: POD #7, having BMs, NGT out 5/3 and started clear liquids 5/3, poor appetite & no PO intake recorded.  No N/V.  Pepcid 20mg  IV q12  Endo: cbgs 121-148, 5 units SSI given; goal < 180 in ICU pt  Lytes: Na 131, K 4.5 (goal >4 with afib) Mag 1.9 (goal >2 ), phos 4.1 on 5/4, Ca 7.8, CoCa 9.56   Renal:  Cr < 1 & stable, UOP 2.7 ml/kg/hr  Pulm: became hypoxic and transferred to ICU, now extubated and on RA.  Pulmicort, Xopenex & Atrovent q6   Cards:  AFib.  VSS.  Coreg  Hepatobil:  LFTs ok, TG 101, PAlb 13.8- significant improvement  Neuro: sedation off   ID: Zosyn 4/28>>5/2 WBC 10.2- improving trend, AFeb  TPN Access: CVC LIJ 4/28 TPN start date: 4/30  Current Nutrition:  Clear liq 5/3 Clinimix E 5/15 at 83 ml/hr + IVFE at 10 ml/hr  Assessment: 69 yo man s/p Subtotal colectomy with ileorectal anastomosis for cecal wall mass and rectal mass on TPN for NPO status  and PCM.  Nutritional Goals:  Kcal 1700-1950, protein >97 g Clinimix E 5/15 at goal rate 83 ml/hr and 20% lipids @ 10 ml/hr will provide 99 gm protein and 1916 Kcal/day  Plan:  - Continue Clinimix E 5/15 at goal rate of 83 ml/hr which provides 99.6 gm protein (100% goal) and 1414 Kcals (~ 83 % Kcal goal). -Withholding lipids x 7 days while pt in ICU per ASPEN guidelines.  To start 5/7 - Mg 2 gm bolus today - BMet, Mg in AM - continue SSI - Add Pepcid to TPN - F/U tolerance of liquid diet   Gracy Bruins, PharmD Clinical Pharmacist [facility]

## 2014-10-13 NOTE — Progress Notes (Signed)
Occupational Therapy Treatment Patient Details Name: [name] MRN: [id number] DOB: Feb 11, 1946 Today's Date: 10/13/2014    History of present illness Pt is a 69 y.o. 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 Pt underwent subtotal colectomy 4/27 and was emergently intubated on 4/28 when pt developed acute respiratory distress. He was extubated 4/30.   OT comments  Pt moves very slowly and requires significant amount of encouragement to participate.  He was able to transfer to w/c with min A - refusing to sit in recliner stating he "hates those chairs".  May have better luck encouraging OOB if he is able to transfer to w/c.  He was incontinent of watery stool.  He is not able to engage in LB ADLs yet due to increased abdominal pain,  Disposition changed to SNF as pt not progressing as quickly as anticipated.   Follow Up Recommendations  SNF;Supervision/Assistance - 24 hour    Equipment Recommendations  None recommended by OT    Recommendations for Other Services      Precautions / Restrictions Precautions Precautions: Fall       Mobility Bed Mobility Overal bed mobility: Needs Assistance Bed Mobility: Supine to Sit Rolling: Min assist Sidelying to sit: Min assist;HOB elevated       General bed mobility comments: Attempted to have pt log roll, but he was insistent on moving supine to sit with HOB elevated.  Assist to lift shoulders due to pain   Transfers Overall transfer level: Needs assistance Equipment used: 1 person hand held assist Transfers: Sit to/from Stand;Stand Pivot Transfers Sit to Stand: Min assist Stand pivot transfers: Min assist       General transfer comment: requires min A to move into standing and min a for balance during transfer     Balance Overall balance assessment: Needs assistance Sitting-balance  support: Feet supported Sitting balance-Leahy Scale: Fair     Standing balance support: Single extremity supported Standing balance-Leahy Scale: Zero Standing balance comment: requires min A and UE support                    ADL                           Toilet Transfer: Minimal assistance;Stand-pivot;BSC   Toileting- Clothing Manipulation and Hygiene: Total assistance;Sit to/from stand       Functional mobility during ADLs: Minimal assistance General ADL Comments: Pt is incontinent of stool.   Required max encouragement to transfer to chair.       Vision                     Perception     Praxis      Cognition   Behavior During Therapy: Anxious Overall Cognitive Status: Difficult to assess                  General Comments: Pt very fixated on his pain.  Difficult to re-direct     Extremity/Trunk Assessment               Exercises     Shoulder Instructions       General Comments      Pertinent Vitals/ Pain       Pain Assessment: 0-10 Pain Score: 9  Pain Location: abdomen  Pain Descriptors / Indicators: Burning;Constant Pain  Intervention(s): Limited activity within patient's tolerance;Repositioned;RN gave pain meds during session  Home Living                                          Prior Functioning/Environment              Frequency Min 2X/week     Progress Toward Goals  OT Goals(current goals can now be found in the care plan section)  Progress towards OT goals: Progressing toward goals (slowly )  ADL Goals Pt Will Perform Upper Body Bathing: with supervision;with set-up;sitting Pt Will Perform Lower Body Bathing: with supervision;sit to/from stand Pt Will Perform Upper Body Dressing: with set-up;with supervision;sitting Pt Will Perform Lower Body Dressing: with supervision;sit to/from stand Pt Will Transfer to Toilet: with supervision;stand pivot transfer;bedside commode Pt Will  Perform Toileting - Clothing Manipulation and hygiene: with supervision;sit to/from stand  Plan Discharge plan needs to be updated    Co-evaluation                 End of Session     Activity Tolerance Patient limited by pain   Patient Left in chair;with call bell/phone within reach;with nursing/sitter in room   Nurse Communication Mobility status;Patient requests pain meds        Time: 1194-1740 OT Time Calculation (min): 24 min  Charges: OT General Charges $OT Visit: 1 Procedure OT Treatments $Therapeutic Activity: 23-37 mins  Morrison Masser M 10/13/2014, 12:06 PM

## 2014-10-13 NOTE — Progress Notes (Signed)
Patient ID: [name], male   DOB: [birth year], 69 y.o.   MRN: [id number]     [facility] [place], [place] [address]    Phone: [phone] FAX: [phone]     Subjective: bm today.  No n/v. Tolerating clears, but doesn't like it.  VSS.  Afebrile.   Objective:  Vital signs:  Filed Vitals:   10/13/14 0600 10/13/14 0700 10/13/14 0800 10/13/14 0810  BP: 111/64  132/60   Pulse: 81 62 62 72  Temp:  98.5 F (36.9 C)    TempSrc:  Oral    Resp: _0 Height:      Weight:      SpO2: 100% 100% 100% 100%    Last BM Date: 10/10/14  Intake/Output   Yesterday:  05/04 0701 - 05/05 0700 In: 2250.4 [I.V.:240; IV Piggyback:100; TPN:1910.4] Out: 2975 [Urine:2975] This shift:  Total I/O In: 93 [I.V.:10; TPN:83] Out: 150 [Urine:150]   Physical Exam: General: Pt awake/alert/oriented x4 in no acute distress  Abdomen: Soft.  Nondistended. Mildly tender at incisions only. midline incision-staples in place, wound edges are approximated.  No evidence of peritonitis.  No incarcerated hernias.    Problem List:   Principal Problem:   GIB (gastrointestinal bleeding) Active Problems:   Protein-calorie malnutrition   Thrombocytopenia   Chronic ischemic heart disease   Atrial fibrillation   BPH (benign prostatic hyperplasia)   HLD (hyperlipidemia)   Depression   Abdominal pain   Hematochezia   Sigmoid & cecal bleeding colon masses s/p abdominal colectomy 10/05/2014   Acute respiratory failure with hypoxia   Aspiration pneumonia   Septic shock   Protein-calorie malnutrition, severe   Acute on chronic respiratory failure with hypoxia   Essential hypertension   Atrial fibrillation with RVR   Hyponatremia   Hypokalemia   Rectal bleeding   Retroperitoneal bleed   Acute blood loss anemia   HCAP (healthcare-associated pneumonia)   Delirium    Results:   Labs: Results for orders placed or performed  during the hospital encounter of 09/29/14 (from the past 48 hour(s))  Glucose, capillary     Status: Abnormal   Collection Time: 10/11/14 11:35 AM  Result Value Ref Range   Glucose-Capillary 167 (H) 70 - 99 mg/dL   Comment 1 Capillary Specimen    Comment 2 Notify RN   Glucose, capillary     Status: Abnormal   Collection Time: 10/11/14  3:57 PM  Result Value Ref Range   Glucose-Capillary 116 (H) 70 - 99 mg/dL  Glucose, capillary     Status: Abnormal   Collection Time: 10/11/14  7:46 PM  Result Value Ref Range   Glucose-Capillary 133 (H) 70 - 99 mg/dL   Comment 1 Capillary Specimen    Comment 2 Notify RN    Comment 3 Document in Chart   Glucose, capillary     Status: Abnormal   Collection Time: 10/11/14 11:28 PM  Result Value Ref Range   Glucose-Capillary 139 (H) 70 - 99 mg/dL   Comment 1 Capillary Specimen   Glucose, capillary     Status: Abnormal   Collection Time: 10/12/14  3:28 AM  Result Value Ref Range   Glucose-Capillary 139 (H) 70 - 99 mg/dL   Comment 1 Venous Specimen   Magnesium     Status: None   Collection Time: 10/12/14  3:34 AM  Result Value Ref Range   Magnesium 1.9  1.7 - 2.4 mg/dL  Comprehensive metabolic panel     Status: Abnormal   Collection Time: 10/12/14  3:34 AM  Result Value Ref Range   Sodium 129 (L) 135 - 145 mmol/L   Potassium 4.3 3.5 - 5.1 mmol/L   Chloride 99 (L) 101 - 111 mmol/L   CO2 23 22 - 32 mmol/L   Glucose, Bld 148 (H) 70 - 99 mg/dL   BUN 20 6 - 20 mg/dL   Creatinine, Ser 0.52 (L) 0.61 - 1.24 mg/dL   Calcium 7.8 (L) 8.9 - 10.3 mg/dL   Total Protein 5.2 (L) 6.5 - 8.1 g/dL   Albumin 1.8 (L) 3.5 - 5.0 g/dL   AST 34 15 - 41 U/L   ALT 36 17 - 63 U/L   Alkaline Phosphatase 70 38 - 126 U/L   Total Bilirubin 0.5 0.3 - 1.2 mg/dL   GFR calc non Af Amer >60 >60 mL/min   GFR calc Af Amer >60 >60 mL/min    Comment: (NOTE) The eGFR has been calculated using the CKD EPI equation. This calculation has not been validated in all clinical  situations. eGFR's persistently <90 mL/min signify possible Chronic Kidney Disease.    Anion gap 7 5 - 15  Phosphorus     Status: None   Collection Time: 10/12/14  3:34 AM  Result Value Ref Range   Phosphorus 4.1 2.5 - 4.6 mg/dL  CBC with Differential/Platelet     Status: Abnormal   Collection Time: 10/12/14  3:34 AM  Result Value Ref Range   WBC 11.6 (H) 4.0 - 10.5 K/uL   RBC 3.49 (L) 4.22 - 5.81 MIL/uL   Hemoglobin 10.7 (L) 13.0 - 17.0 g/dL   HCT 32.3 (L) 39.0 - 52.0 %   MCV 92.6 78.0 - 100.0 fL   MCH 30.7 26.0 - 34.0 pg   MCHC 33.1 30.0 - 36.0 g/dL   RDW 14.0 11.5 - 15.5 %   Platelets 167 150 - 400 K/uL   Neutrophils Relative % 71 43 - 77 %   Neutro Abs 8.2 (H) 1.7 - 7.7 K/uL   Lymphocytes Relative 21 12 - 46 %   Lymphs Abs 2.4 0.7 - 4.0 K/uL   Monocytes Relative 7 3 - 12 %   Monocytes Absolute 0.8 0.1 - 1.0 K/uL   Eosinophils Relative 2 0 - 5 %   Eosinophils Absolute 0.2 0.0 - 0.7 K/uL   Basophils Relative 0 0 - 1 %   Basophils Absolute 0.0 0.0 - 0.1 K/uL  Glucose, capillary     Status: Abnormal   Collection Time: 10/12/14  7:33 AM  Result Value Ref Range   Glucose-Capillary 132 (H) 70 - 99 mg/dL   Comment 1 Capillary Specimen    Comment 2 Notify RN    Comment 3 Document in Chart   Glucose, capillary     Status: Abnormal   Collection Time: 10/12/14  1:05 PM  Result Value Ref Range   Glucose-Capillary 117 (H) 70 - 99 mg/dL   Comment 1 Capillary Specimen    Comment 2 Notify RN   Glucose, capillary     Status: Abnormal   Collection Time: 10/12/14  4:05 PM  Result Value Ref Range   Glucose-Capillary 126 (H) 70 - 99 mg/dL   Comment 1 Capillary Specimen    Comment 2 Notify RN   Glucose, capillary     Status: Abnormal   Collection Time: 10/12/14  7:13 PM  Result Value Ref Range  Glucose-Capillary 121 (H) 70 - 99 mg/dL   Comment 1 Capillary Specimen    Comment 2 Notify RN    Comment 3 Document in Chart   Glucose, capillary     Status: Abnormal   Collection Time:  10/12/14 11:21 PM  Result Value Ref Range   Glucose-Capillary 142 (H) 70 - 99 mg/dL   Comment 1 Capillary Specimen    Comment 2 Notify RN    Comment 3 Document in Chart   Glucose, capillary     Status: Abnormal   Collection Time: 10/13/14  3:23 AM  Result Value Ref Range   Glucose-Capillary 148 (H) 70 - 99 mg/dL   Comment 1 Capillary Specimen    Comment 2 Notify RN    Comment 3 Document in Chart   CBC     Status: Abnormal   Collection Time: 10/13/14  4:20 AM  Result Value Ref Range   WBC 10.2 4.0 - 10.5 K/uL   RBC 3.52 (L) 4.22 - 5.81 MIL/uL   Hemoglobin 10.8 (L) 13.0 - 17.0 g/dL   HCT 32.7 (L) 39.0 - 52.0 %   MCV 92.9 78.0 - 100.0 fL   MCH 30.7 26.0 - 34.0 pg   MCHC 33.0 30.0 - 36.0 g/dL   RDW 14.1 11.5 - 15.5 %   Platelets 175 150 - 400 K/uL  Magnesium     Status: None   Collection Time: 10/13/14  4:20 AM  Result Value Ref Range   Magnesium 1.9 1.7 - 2.4 mg/dL  Phosphorus     Status: None   Collection Time: 10/13/14  4:20 AM  Result Value Ref Range   Phosphorus 4.5 2.5 - 4.6 mg/dL  Prealbumin     Status: Abnormal   Collection Time: 10/13/14  4:20 AM  Result Value Ref Range   Prealbumin 13.8 (L) 18 - 38 mg/dL  Basic metabolic panel     Status: Abnormal   Collection Time: 10/13/14  4:20 AM  Result Value Ref Range   Sodium 131 (L) 135 - 145 mmol/L   Potassium 4.5 3.5 - 5.1 mmol/L   Chloride 100 (L) 101 - 111 mmol/L   CO2 24 22 - 32 mmol/L   Glucose, Bld 168 (H) 70 - 99 mg/dL   BUN 22 (H) 6 - 20 mg/dL   Creatinine, Ser 0.50 (L) 0.61 - 1.24 mg/dL   Calcium 8.1 (L) 8.9 - 10.3 mg/dL   GFR calc non Af Amer >60 >60 mL/min   GFR calc Af Amer >60 >60 mL/min    Comment: (NOTE) The eGFR has been calculated using the CKD EPI equation. This calculation has not been validated in all clinical situations. eGFR's persistently <90 mL/min signify possible Chronic Kidney Disease.    Anion gap 7 5 - 15  Glucose, capillary     Status: Abnormal   Collection Time: 10/13/14  7:47 AM   Result Value Ref Range   Glucose-Capillary 142 (H) 70 - 99 mg/dL   Comment 1 Notify RN     Imaging / Studies: Dg Chest Port 1 View  10/13/2014   CLINICAL DATA:  Rectal mass.  EXAM: PORTABLE CHEST - 1 VIEW  COMPARISON:  10/09/2014  FINDINGS: The nasogastric tube has been removed. There are intact appearances of the transvenous leads. The left jugular central line extends into the SVC. Basilar airspace opacities are present bilaterally, and there probably also is a left pleural effusion. There is worsened consolidation in the medial bases.  IMPRESSION: Worsened basilar consolidation and  probable small pleural effusion.   Electronically Signed   By: Andreas Newport M.D.   On: 10/13/2014 06:00    Medications / Allergies:  Scheduled Meds: . antiseptic oral rinse  7 mL Mouth Rinse QID  . budesonide  0.25 mg Nebulization Q12H  . calcium-vitamin D  1 tablet Per Tube Daily  . carvedilol  6.25 mg Oral BID WC  . chlorhexidine  15 mL Mouth Rinse BID  . heparin subcutaneous  5,000 Units Subcutaneous 3 times per day  . insulin aspart  0-9 Units Subcutaneous 6 times per day  . ipratropium  0.5 mg Nebulization Q6H  . levalbuterol  0.63 mg Nebulization QID  . magnesium sulfate 1 - 4 g bolus IVPB  2 g Intravenous Once   Continuous Infusions: . Marland KitchenTPN (CLINIMIX-E) Adult 83 mL/hr at 10/12/14 1730  . dextrose 5 % and 0.9% NaCl 10 mL/hr at 10/13/14 0756  . Marland KitchenTPN (CLINIMIX-E) Adult     PRN Meds:.acetaminophen, fentaNYL (SUBLIMAZE) injection, HYDROcodone-acetaminophen, levalbuterol, metoprolol, sodium chloride  Antibiotics: Anti-infectives    Start     Dose/Rate Route Frequency Ordered Stop   10/06/14 1445  piperacillin-tazobactam (ZOSYN) IVPB 3.375 g  Status:  Discontinued     3.375 g 12.5 mL/hr over 240 Minutes Intravenous 3 times per day 10/06/14 1443 10/10/14 1232   10/05/14 1117  cefoTEtan in Dextrose 5% (CEFOTAN) 2-2.08 GM-% IVPB    Comments:  Ronnald Ramp, [place]   : cabinet override      10/05/14 1117  10/05/14 2329   10/05/14 1045  cefoTEtan (CEFOTAN) 2 g in dextrose 5 % 50 mL IVPB     2 g 100 mL/hr over 30 Minutes Intravenous 30 min pre-op 10/04/14 1548 10/05/14 1140        Assessment/Plan Cecal and rectal mass POD#8 subtotal colectomy with ileorectal anastomosis---Dr. 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  Will discuss with attending if this has been discussed with the patient and if any further treatment is needed(ie OP onc follow up) VTE prophylaxis-SCD/heparin FEN-advance diet, nutrition consult to ensure PO is adequate enough before stopping TNA PCM-continue TNA  for now dispo-may transfer from surgical standpoint   Erby Pian, [facility] Pager [phone](7A-4:30P) For consults and floor pages call [phone](7A-4:30P)  10/13/2014 10:17 AM

## 2014-10-13 NOTE — Progress Notes (Signed)
[facility] TEAM 1 - Stepdown/ICU TEAM Progress Note  [name] DOB: 09-Sep-1945 DOA: 09/29/2014 PCP: [name], MD  Admit HPI / Brief Narrative: [name] is a 69 y.o. 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 Presents with rectal bleeding.  Patient was recently hospitalized from 4/16 to 4/18 because of GI bleeding. He had EGD on 4/18, which was negative for active bleeding. Gastroenterology recommended proceeding with a colonoscopy, however since bleeding has resolved, patient did not agree. His Xarelto was restarted on 4/19. He started having rectal bleeding again today. He had 4 times of rectal bleeding with bright red blood per patient. He does not have lightheadedness, chest pain or shortness breath. Patient reports having severe abdominal pain over suprapubic area. He has mild nausea, but no vomiting or diarrhea. In ED, patient was found to have slight hemoglobin drop from 10.2 on 09/26/14 to 9.1. Temperature normal, no tachycardia, INR 1.79, electrolytes okay and positive FOBT. Patient is admitted to inpatient for further evaluation and treatment.  HPI/Subjective: 5/4 A/O 4, states abdomen still tender, but improved. Negative N/V.   Assessment/Plan: Acute hypoxic respiratory failure  -Multifactorial to include COPD exacerbation, PCA overdose,+/- HCAP -Resolved -Continue Atrovent QID -Continue Pulmicort nebulizer BID -Note patient's trach aspirate positive for strep pneumo. Patient has not spiked fever, leukocytosis, bands or left shift. Continue to watch closely and if any of the above occur would restart antibiotics.  HCAP vs Aspiration PNA LLL  -Resolved -See acute respiratory  HTN  -Continue Coreg 6.25 mg BID -Continue metoprolol PRN heart rate> 120  AFib with RVR (chronic) CHADS-VASc =2c -Currently in NSR  -See HTN; Cont hold  anticoagulation post op until surgery clears  Hyopnatremia/ hypokalemia -Continues to improve  -Hypokalemia resolved -Replace lateral lites as required  Rectal bleeding due to Rectal mass -Resolved S/p subtotal colectomy 4/27  Retroperitoneal bleed - small.  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 Marland KitchenTPN (CLINIMIX-E) Adult 83 mL/hr at 10/12/14 1730  . dextrose 5 % and 0.9% NaCl 10 mL/hr at 10/13/14 0756  . Marland KitchenTPN (CLINIMIX-E) Adult      Objective: VITAL SIGNS: Temp: 99.1 F (37.3 C) (05/05 1100) Temp Source: Oral (05/05 1100) BP: 121/71 mmHg (05/05 1200) Pulse Rate: 83 (05/05 1000) SPO2; FIO2:   Intake/Output Summary (Last 24 hours) at 10/13/14 1302 Last data filed at 10/13/14 1200  Gross per 24 hour  Intake 2257.38 ml  Output   2775 ml  Net -517.62 ml  Exam: General:  A/O 4, cachectic, poor appetite,No acute respiratory distress Lungs: Clear to auscultation  bilaterally without wheezes or crackles Cardiovascular: Regular rate and rhythm without murmur gallop or rub normal S1 and S2; pacer present right chest wall Abdomen: Nontender, nondistended, soft, bowel sounds positive, no rebound, no ascites, no appreciable mass, midline vertical abdominal incision closed with staples, negative sign of infection Extremities: No significant cyanosis, clubbing, or edema bilateral lower extremities  Data Reviewed: Basic Metabolic Panel:  Recent Labs Lab 10/08/14 0424 10/09/14 0400 10/10/14 0400 10/11/14 0400 10/12/14 0334 10/13/14 0420  NA 133* 137 136 134* 129* 131*  K 3.5 3.2* 3.7 3.7 4.3 4.5  CL 100 101 104 101 99* 100*  CO2 25 27 23 23 23 24   GLUCOSE 139* 135* 175* 132* 148* 168*  BUN 18 12 21* 20 20 22*  CREATININE 0.82 0.65 0.62 0.51* 0.52* 0.50*  CALCIUM 7.7* 7.7* 7.9* 7.9* 7.8* 8.1*  MG 1.9 1.8 1.7 1.9 1.9 1.9  PHOS 2.5 3.3 4.2  --  4.1 4.5   Liver Function Tests:  Recent Labs Lab 10/07/14 0350 10/09/14 0400 10/10/14 0400 10/12/14 0334  AST 22 14* 15 34  ALT 15 15* 14* 36  ALKPHOS 54 55 54 70  BILITOT 0.5 0.4 0.6 0.5  PROT 4.9* 4.8* 4.8* 5.2*  ALBUMIN 1.9* 1.6* 1.6* 1.8*   No results for input(s): LIPASE, AMYLASE in the last 168 hours. No results for input(s): AMMONIA in the last 168 hours. CBC:  Recent Labs Lab 10/08/14 0424 10/09/14 0400 10/10/14 0845 10/12/14 0334 10/13/14 0420  WBC 8.5 6.3 12.5* 11.6* 10.2  NEUTROABS  --   --   --  8.2*  --   HGB 7.9* 6.9* 12.9* 10.7* 10.8*  HCT 23.8* 20.9* 38.0* 32.3* 32.7*  MCV 92.6 93.3 91.3 92.6 92.9  PLT 121* 105* 156 167 175   Cardiac Enzymes: No results for input(s): CKTOTAL, CKMB, CKMBINDEX, TROPONINI in the last 168 hours. BNP (last 3 results) No results for input(s): BNP in the last 8760 hours.  ProBNP (last 3 results) No results for input(s): PROBNP in the last 8760 hours.  CBG:  Recent Labs Lab 10/12/14 1913 10/12/14 2321 10/13/14 0323 10/13/14 0747  10/13/14 1130  GLUCAP 121* 142* 148* 142* 102*    Recent Results (from the past 240 hour(s))  Culture, respiratory (NON-Expectorated)     Status: None   Collection Time: 10/06/14  3:45 PM  Result Value Ref Range Status   Specimen Description TRACHEAL ASPIRATE  Final   Special Requests NONE  Final   Gram Stain   Final    ABUNDANT WBC PRESENT, PREDOMINANTLY PMN FEW SQUAMOUS EPITHELIAL CELLS PRESENT ABUNDANT GRAM POSITIVE COCCI IN PAIRS Performed at [facility]    Culture   Final    ABUNDANT STREPTOCOCCUS PNEUMONIAE Performed at [facility]    Report Status 10/10/2014 FINAL  Final   Organism ID, Bacteria STREPTOCOCCUS PNEUMONIAE  Final      Susceptibility   Streptococcus pneumoniae - MIC (ETEST)*    CEFTRIAXONE 0.032 SENSITIVE Sensitive     LEVOFLOXACIN 0.75 SENSITIVE Sensitive     PENICILLIN 0.032 SENSITIVE Sensitive     * ABUNDANT STREPTOCOCCUS PNEUMONIAE     Studies:  Recent x-ray studies have been reviewed in detail by the Attending Physician  Scheduled Meds:  Scheduled Meds: . antiseptic oral rinse  7 mL Mouth Rinse QID  . budesonide  0.25 mg Nebulization Q12H  . calcium-vitamin D  1 tablet Per  Tube Daily  . carvedilol  6.25 mg Oral BID WC  . chlorhexidine  15 mL Mouth Rinse BID  . heparin subcutaneous  5,000 Units Subcutaneous 3 times per day  . insulin aspart  0-9 Units Subcutaneous 6 times per day  . ipratropium  0.5 mg Nebulization Q6H  . levalbuterol  0.63 mg Nebulization QID    Time spent on care of this patient: 40 mins   Danell Vazquez, Geraldo Docker , MD  [facility] Office  [phone] Pager - [phone]  On-Call/Text Page:      [email or web address]      password [email or web address]  If 7PM-7AM, please contact night-coverage [email or web address] Password [email or web address] 10/13/2014, 1:02 PM   LOS: 14 days   Care during the described time interval was provided by me .  I have reviewed this patient's available data, including medical history, events of note, physical  examination, radiology studies and test results as part of my evaluation  Dia Crawford, MD [phone] Pager

## 2014-10-14 LAB — GLUCOSE, CAPILLARY
Glucose-Capillary: 109 mg/dL — ABNORMAL HIGH (ref 70–99)
Glucose-Capillary: 112 mg/dL — ABNORMAL HIGH (ref 70–99)
Glucose-Capillary: 115 mg/dL — ABNORMAL HIGH (ref 70–99)
Glucose-Capillary: 133 mg/dL — ABNORMAL HIGH (ref 70–99)
Glucose-Capillary: 147 mg/dL — ABNORMAL HIGH (ref 70–99)
Glucose-Capillary: 151 mg/dL — ABNORMAL HIGH (ref 70–99)
Glucose-Capillary: 175 mg/dL — ABNORMAL HIGH (ref 70–99)

## 2014-10-14 LAB — BASIC METABOLIC PANEL
ANION GAP: 6 (ref 5–15)
BUN: 25 mg/dL — ABNORMAL HIGH (ref 6–20)
CHLORIDE: 100 mmol/L — AB (ref 101–111)
CO2: 25 mmol/L (ref 22–32)
Calcium: 8.5 mg/dL — ABNORMAL LOW (ref 8.9–10.3)
Creatinine, Ser: 0.52 mg/dL — ABNORMAL LOW (ref 0.61–1.24)
GFR calc Af Amer: >60 mL/min (ref >60)
GFR calc non Af Amer: >60 mL/min (ref >60)
Glucose, Bld: 139 mg/dL — ABNORMAL HIGH (ref 70–99)
POTASSIUM: 4.7 mmol/L (ref 3.5–5.1)
Sodium: 131 mmol/L — ABNORMAL LOW (ref 135–145)

## 2014-10-14 LAB — MAGNESIUM: MAGNESIUM: 2.1 mg/dL (ref 1.7–2.4)

## 2014-10-14 MED ORDER — RIVAROXABAN 20 MG PO TABS
20.0000 mg | ORAL_TABLET | Freq: Every day | ORAL | Status: DC
  Administered 2014-10-14 – 2014-10-16 (×3): 20 mg via ORAL
  Filled 2014-10-14 (×4): qty 1

## 2014-10-14 MED ORDER — FAMOTIDINE 20 MG PO TABS
20.0000 mg | ORAL_TABLET | Freq: Two times a day (BID) | ORAL | Status: DC
  Administered 2014-10-14 – 2014-10-17 (×6): 20 mg via ORAL
  Filled 2014-10-14 (×6): qty 1

## 2014-10-14 MED ORDER — TRACE MINERALS CR-CU-F-FE-I-MN-MO-SE-ZN IV SOLN
INTRAVENOUS | Status: DC
  Filled 2014-10-14: qty 1992

## 2014-10-14 MED ORDER — OXYCODONE-ACETAMINOPHEN 5-325 MG PO TABS
1.0000 | ORAL_TABLET | ORAL | Status: DC | PRN
  Administered 2014-10-14 – 2014-10-17 (×12): 2 via ORAL
  Filled 2014-10-14 (×12): qty 2

## 2014-10-14 MED ORDER — BOOST / RESOURCE BREEZE PO LIQD
1.0000 | Freq: Three times a day (TID) | ORAL | Status: DC
  Administered 2014-10-14 – 2014-10-16 (×3): 1 via ORAL

## 2014-10-14 NOTE — Discharge Instructions (Addendum)
Information on my medicine - XARELTO (Rivaroxaban)  This medication education was reviewed with me or my healthcare representative as part of my discharge preparation.  The pharmacist that spoke with me during my hospital stay was:  Jaquita Folds, [facility]  Why was Xarelto prescribed for you? Xarelto was prescribed for you to reduce the risk of a blood clot forming that can cause a stroke if you have a medical condition called atrial fibrillation (a type of irregular heartbeat).  What do you need to know about xarelto ? Take your Xarelto ONCE DAILY at the same time every day with your evening meal. If you have difficulty swallowing the tablet whole, you may crush it and mix in applesauce just prior to taking your dose.  Take Xarelto exactly as prescribed by your doctor and DO NOT stop taking Xarelto without talking to the doctor who prescribed the medication.  Stopping without other stroke prevention medication to take the place of Xarelto may increase your risk of developing a clot that causes a stroke.  Refill your prescription before you run out.  After discharge, you should have regular check-up appointments with your healthcare provider that is prescribing your Xarelto.  In the future your dose may need to be changed if your kidney function or weight changes by a significant amount.  What do you do if you miss a dose? If you are taking Xarelto ONCE DAILY and you miss a dose, take it as soon as you remember on the same day then continue your regularly scheduled once daily regimen the next day. Do not take two doses of Xarelto at the same time or on the same day.   Important Safety Information A possible side effect of Xarelto is bleeding. You should call your healthcare provider right away if you experience any of the following: ? Bleeding from an injury or your nose that does not stop. ? Unusual colored urine (red or dark brown) or unusual colored stools (red or black). ? Unusual  bruising for unknown reasons. ? A serious fall or if you hit your head (even if there is no bleeding).  Some medicines may interact with Xarelto and might increase your risk of bleeding while on Xarelto. To help avoid this, consult your healthcare provider or pharmacist prior to using any new prescription or non-prescription medications, including herbals, vitamins, non-steroidal anti-inflammatory drugs (NSAIDs) and supplements.  This website has more information on Xarelto: [email or web address]  [facility], [place] [phone]  OPEN ABDOMINAL SURGERY: POST OP INSTRUCTIONS  Always review your discharge instruction sheet given to you by the facility where your surgery was performed.  IF YOU HAVE DISABILITY OR FAMILY LEAVE FORMS, YOU MUST BRING THEM TO THE OFFICE FOR PROCESSING.  PLEASE DO NOT GIVE THEM TO YOUR DOCTOR.  1. A prescription for pain medication may be given to you upon discharge.  Take your pain medication as prescribed, if needed.  If narcotic pain medicine is not needed, then you may take acetaminophen (Tylenol) or ibuprofen (Advil) as needed. 2. Take your usually prescribed medications unless otherwise directed. 3. If you need a refill on your pain medication, please contact your pharmacy. They will contact our office to request authorization.  Prescriptions will not be filled after 5pm or on week-ends. 4. You should follow a light diet the first few days after arrival home, such as soup and crackers, pudding, etc.unless your doctor has advised otherwise. A high-fiber, low fat diet can be resumed  as tolerated.   Be sure to include lots of fluids daily. Most patients will experience some swelling and bruising on the chest and neck area.  Ice packs will help.  Swelling and bruising can take several days to resolve 5. Most patients will experience some swelling and bruising in the area of the incision. Ice pack will help. Swelling and bruising can take several days to  resolve..  6. It is common to experience some constipation if taking pain medication after surgery.  Increasing fluid intake and taking a stool softener will usually help or prevent this problem from occurring.  A mild laxative (Milk of Magnesia or Miralax) should be taken according to package directions if there are no bowel movements after 48 hours. 7.  You may have steri-strips (small skin tapes) in place directly over the incision.  These strips should be left on the skin for 7-10 days.  If your surgeon used skin glue on the incision, you may shower in 24 hours.  The glue will flake off over the next 2-3 weeks.  Any sutures or staples will be removed at the office during your follow-up visit. You may find that a light gauze bandage over your incision may keep your staples from being rubbed or pulled. You may shower and replace the bandage daily. 8. ACTIVITIES:  You may resume regular (light) daily activities beginning the next day--such as daily self-care, walking, climbing stairs--gradually increasing activities as tolerated.  You may have sexual intercourse when it is comfortable.  Refrain from any heavy lifting or straining until approved by your doctor. a. You may drive when you no longer are taking prescription pain medication, you can comfortably wear a seatbelt, and you can safely maneuver your car and apply brakes b. Return to Work: ___________________________________ 19. You should see your doctor in the office for a follow-up appointment approximately two weeks after your surgery.  Make sure that you call for this appointment within a day or two after you arrive home to insure a convenient appointment time. OTHER INSTRUCTIONS:  _____________________________________________________________ _____________________________________________________________  WHEN TO CALL YOUR DOCTOR: 1. Fever over 101.0 2. Inability to urinate 3. Nausea and/or vomiting 4. Extreme swelling or  bruising 5. Continued bleeding from incision. 6. Increased pain, redness, or drainage from the incision. 7. Difficulty swallowing or breathing 8. Muscle cramping or spasms. 9. Numbness or tingling in hands or feet or around lips.  The clinic staff is available to answer your questions during regular business hours.  Please dont hesitate to call and ask to speak to one of the nurses if you have concerns.  For further questions, please visit [email or web address]

## 2014-10-14 NOTE — Progress Notes (Addendum)
NUTRITION CONSULT/FOLLOW UP  DOCUMENTATION CODES Per approved criteria  -Severe malnutrition in the context of chronic illness -Underweight   INTERVENTION:  TPN per pharmacy Boost Breeze po TID between meals, each supplement provides 250 kcal and 9 grams of protein Magic cup TID with meals, each supplement provides 290 kcal and 9 grams of protein  NUTRITION DIAGNOSIS: Inadequate oral intake now related to altered GI function as evidenced by continued TPN, ongoing  Goal: Pt to meet >/= 90% of their estimated nutrition needs, progressing  Monitor:  TPN prescription, PO intake, weight, labs, I/O's  ASSESSMENT: [name] is a 69 y.o. male who had presented with rectal bleeding. Masses found in cecum/rectum s/p subtotal colectomy with ileorectal anastomosis. 4/28 developed acute resp distress and intubated. Found to have PNA.  Patient s/p procedures 4/27: SUBTOTAL COLECTOMY WITH ILEORECTAL ANASTOMOSIS  Pt advanced to Soft diet 5/6.  Does not like Ensure.  PO intake at lunch today ~ 25% (per lunch tray observation).  Will have him try Boost Breeze & Magic Cup dessert supplement.    He will need to consume 3 supplements (Boost Breeze and/or [employer]) per day to meet his nutrition goal.  Patient is receiving TPN with Clinimix E 5/15 @ 83 ml/hr.  Lipids are currently being held.  Provides 1414 kcal and 100 grams protein per day. Meets 94% minimum estimated energy needs and 100% minimum estimated protein needs.  Height: Ht Readings from Last 1 Encounters:  09/30/14 5\' 9"  (1.753 m)    Weight: Wt Readings from Last 1 Encounters:  10/12/14 112 lb 8 oz (51.03 kg)    BMI:  Body mass index is 16.61 kg/(m^2).  Re-estimated Nutritional Needs: Kcal: 1500-1700 Protein: 80-90 gm Fluid: 1.5-1.7 L  Skin: surgical wounds, abrasions, dry/cracking skin  Diet Order: TPN (CLINIMIX-E) Adult DIET SOFT Room service appropriate?: Yes; Fluid consistency:: Thin   Intake/Output Summary  (Last 24 hours) at 10/14/14 1451 Last data filed at 10/14/14 1400  Gross per 24 hour  Intake 1633.13 ml  Output    860 ml  Net 773.13 ml    Last BM:  5/5  Labs:   Recent Labs Lab 10/10/14 0400  10/12/14 0334 10/13/14 0420 10/14/14 0505  NA 136  < > 129* 131* 131*  K 3.7  < > 4.3 4.5 4.7  CL 104  < > 99* 100* 100*  CO2 23  < > 23 24 25   BUN 21*  < > 20 22* 25*  CREATININE 0.62  < > 0.52* 0.50* 0.52*  CALCIUM 7.9*  < > 7.8* 8.1* 8.5*  MG 1.7  < > 1.9 1.9 2.1  PHOS 4.2  --  4.1 4.5  --   GLUCOSE 175*  < > 148* 168* 139*  < > = values in this interval not displayed.  CBG (last 3)   Recent Labs  10/14/14 0407 10/14/14 0755 10/14/14 1143  GLUCAP 147* 151* 109*    Scheduled Meds: . budesonide  0.25 mg Nebulization Q12H  . calcium-vitamin D  1 tablet Per Tube Daily  . carvedilol  6.25 mg Oral BID WC  . famotidine  20 mg Oral BID  . insulin aspart  0-9 Units Subcutaneous 6 times per day  . rivaroxaban  20 mg Oral Q supper    Continuous Infusions: . dextrose 5 % and 0.9% NaCl 10 mL/hr at 10/13/14 0756  . Marland KitchenTPN (CLINIMIX-E) Adult 40 mL/hr at 10/14/14 0859    Past Medical History  Diagnosis Date  .  Depression   . Hypomagnesemia   . Dyspepsia   . Fall at nursing home 05/27/2013    "slipped on water; said I broke my right hip" (05/27/2013)  . Migraine     "used to have them bad; haven't had one in awhile" (05/27/2013)  . Gout attack     "not too long ago; in my right foot" (05/27/2013)  . Complete heart block     reason for pacemaker/notes 05/27/2013  . Atrial fibrillation     [name] 05/27/2013  . HLD (hyperlipidemia)   . GIB (gastrointestinal bleeding)   . BPH (benign prostatic hyperplasia)     Past Surgical History  Procedure Laterality Date  . Insert / replace / remove pacemaker    . Appendectomy    . Cholecystectomy    . Open reduction of hip Right 05/28/2013    Procedure: OPEN REDUCTION OF HIP;  Surgeon: Renette Butters, MD;  Location: [facility];   Service: Orthopedics;  Laterality: Right;  . Esophagogastroduodenoscopy (egd) with propofol N/A 09/26/2014    Procedure: ESOPHAGOGASTRODUODENOSCOPY (EGD) WITH PROPOFOL;  Surgeon: Jerene Bears, MD;  Location: [facility] ENDOSCOPY;  Service: Endoscopy;  Laterality: N/A;  . Colonoscopy N/A 10/01/2014    Procedure: COLONOSCOPY;  Surgeon: Jerene Bears, MD;  Location: [name] ENDOSCOPY;  Service: Endoscopy;  Laterality: N/A;  . Partial colectomy N/A 10/05/2014    Procedure: SUBTOTAL COLECTOMY WITH ILEORECTAL ANASTOMOSIS;  Surgeon: Donnie Mesa, MD;  Location: [facility];  Service: General;  Laterality: N/A;    Arthur Holms, RD, LDN Pager #: [phone] After-Hours Pager #: [phone]

## 2014-10-14 NOTE — Progress Notes (Signed)
Physical Therapy Treatment Patient Details Name: [name] MRN: [id number] DOB: 07-Dec-1945 Today's Date: 10/14/2014    History of Present Illness Pt is a 69 y.o. 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 Pt underwent subtotal colectomy 4/27 and was emergently intubated on 4/28 when pt developed acute respiratory distress. He was extubated 4/30.    PT Comments    Pt progressing towards physical therapy goals. Was able to transfer to the wheelchair with min assist for balance and support. Pt required max encouragement to participate in session initially, however put forth a very good rehab effort once OOB. Pt was able to self-propel w/c ~130 feet with therapist managing IV pole and assisting around tight corners in room. Pt appeared enthusiastic about his progress and was asking about trying again later today.    Follow Up Recommendations  Home health PT;Supervision/Assistance - 24 hour     Equipment Recommendations  None recommended by PT    Recommendations for Other Services       Precautions / Restrictions Precautions Precautions: Fall Restrictions Weight Bearing Restrictions: No    Mobility  Bed Mobility Overal bed mobility: Needs Assistance Bed Mobility: Supine to Sit     Supine to sit: Min guard     General bed mobility comments: Pt was cued for log roll however moved supine to long sitting and then scooted around to EOB. Min guard for trunk stability when transitioning to seated position.   Transfers Overall transfer level: Needs assistance Equipment used: 1 person hand held assist Transfers: Sit to/from [employer] Sit to Stand: Min assist Stand pivot transfers: Min assist       General transfer comment: Assist to maintain balance when powering up to full standing position. Pt reaching out for  wheelchair arm rests for support when taking pivotal steps however feel that pt could do this without UE support. Has fear of falling.   Ambulation/Gait                 Hotel manager mobility: Yes Wheelchair propulsion: Both upper extremities Wheelchair parts: Supervision/cueing Distance: 130 Wheelchair Assistance Details (indicate cue type and reason): Occasional assist for w/c direction during tight turns as w/c is wider than he is used to. Therapist pushed IV pole as pt propelled w/c.   Modified Rankin (Stroke Patients Only)       Balance Overall balance assessment: Needs assistance Sitting-balance support: Feet supported;No upper extremity supported Sitting balance-Leahy Scale: Fair     Standing balance support: Single extremity supported;During functional activity Standing balance-Leahy Scale: Poor                      Cognition Arousal/Alertness: Awake/alert Behavior During Therapy: Anxious Overall Cognitive Status: Within Functional Limits for tasks assessed                      Exercises      General Comments General comments (skin integrity, edema, etc.): Educated on importance of incr activity and risks of inactivity. Pt appeared motivated for more activity at end of session after he was able to maneuver the w/c himself.       Pertinent Vitals/Pain Pain Assessment: Faces Faces Pain Scale: Hurts even more Pain Location: Abdomen with initial mobility Pain Descriptors /  Indicators: Operative site guarding Pain Intervention(s): Limited activity within patient's tolerance;Monitored during session;Repositioned    Home Living                      Prior Function            PT Goals (current goals can now be found in the care plan section) Acute Rehab PT Goals Patient Stated Goal: to go back to ALF PT Goal Formulation: With patient Time For Goal Achievement:  10/24/14 Potential to Achieve Goals: Good Progress towards PT goals: Progressing toward goals    Frequency  Min 3X/week    PT Plan Current plan remains appropriate    Co-evaluation             End of Session Equipment Utilized During Treatment: Gait belt Activity Tolerance: Patient tolerated treatment well Patient left: with call bell/phone within reach;Other (comment) (Sitting up in wheelchair)     Time: 8101-7510 PT Time Calculation (min) (ACUTE ONLY): 29 min  Charges:  $Therapeutic Activity: 8-22 mins $Wheel Chair Management: 8-22 mins                    G Codes:      Rolinda Roan 11/12/14, 8:48 AM   Rolinda Roan, PT, DPT Acute Rehabilitation Services Pager: [phone]

## 2014-10-14 NOTE — Clinical Social Work Note (Signed)
Patient is from [facility]F, contact person is Jae Dire (phone [phone]) CSW spoke to her on Wednesday she said  he has been living there for 11 years.  Plan for him to return, facility said they can take him back once he is ready for discharge, and can get home health at ALF.   Jones Broom. [place], MSW, [place] 10/14/2014 6:40 PM

## 2014-10-14 NOTE — Progress Notes (Addendum)
TEAM 1 - Stepdown/ICU TEAM Progress Note  ADVAITH Goodwin YNW:[id number] DOB: 10-Nov-1945 DOA: 09/29/2014 PCP: [name], MD  Admit HPI / Brief Narrative: [name] is a 69 y.o. 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 Presented with rectal bleeding. Patient was recently hospitalized from 4/16 to 4/18 because of GI bleeding. He had EGD on 4/18, which was negative for active bleeding. Gastroenterology recommended proceeding with a colonoscopy, however since bleeding has resolved, patient did not agree. His Xarelto was restarted on 4/19. He started having rectal bleeding again today. He had 4 times of rectal bleeding with bright red blood per patient. He does not have lightheadedness, chest pain or shortness breath. Patient reports having severe abdominal pain over suprapubic area. He has mild nausea, but no vomiting or diarrhea.  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 Patient has not spiked fever, leukocytosis, bands or left shift. Continue to watch closely and if any of the above occur would restart antibiotics.  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  -Stable, monitor closely  Acute blood loss Anemia  - resolved with subtotal colectomy 4/27  Thrombocytopenia  -resolved  Protein calorie malnutrition -wean TNA, advance diet  AMS -Resolved   Code Status: FULL Family Communication: no family present at time of exam Disposition Plan: ALF tomorrow if stable, CSW consult for dispo, may need guardian   Consultants: Dr.Douglas B McQuaid (PCCM) Dr.Douglas Ninfa Linden (CCS)     Culture 4/22 MRSA by PCR negative 4/28 tracheal aspirate positive strep pneumo   Antibiotics: Zosyn 4/28>>>5/2  DVT prophylaxis: Subcutaneous heparin   Devices   LINES / TUBES:  CVL L IJ CVL 4/28>>>    Continuous Infusions: . dextrose 5 % and 0.9% NaCl 10 mL/hr at 10/13/14 0756  . Marland KitchenTPN (CLINIMIX-E) Adult 40 mL/hr at 10/14/14 0859    Objective: VITAL SIGNS: Temp: 98.4 F (36.9 C) (05/06 0415) Temp Source: Oral (05/06 0415) BP: 102/62 mmHg (05/06 0415) Pulse Rate: 75 (05/06 0415) SPO2; FIO2:   Intake/Output Summary (Last 24 hours) at 10/14/14 1048 Last data filed at 10/14/14 0920  Gross per 24 hour  Intake 1259.13 ml  Output    585 ml  Net 674.13 ml     Exam: General:  A/O 4, cachectic, no distress Lungs: CTAB, diminished at bases Cardiovascular: Regular rate and rhythm without murmur gallop or rub normal S1 and  S2; pacer present right chest wall Abdomen: Nontender, nondistended, soft, bowel sounds positive, no rebound, no ascites, no appreciable mass, midline  vertical abdominal incision closed with staples, negative sign of infection Extremities: No significant cyanosis, clubbing, or edema bilateral lower extremities  Data Reviewed: Basic Metabolic Panel:  Recent Labs Lab 10/08/14 0424 10/09/14 0400 10/10/14 0400 10/11/14 0400 10/12/14 0334 10/13/14 0420 10/14/14 0505  NA 133* 137 136 134* 129* 131* 131*  K 3.5 3.2* 3.7 3.7 4.3 4.5 4.7  CL 100 101 104 101 99* 100* 100*  CO2 25 27 23 23 23 24 25   GLUCOSE 139* 135* 175* 132* 148* 168* 139*  BUN 18 12 21* 20 20 22* 25*  CREATININE 0.82 0.65 0.62 0.51* 0.52* 0.50* 0.52*  CALCIUM 7.7* 7.7* 7.9* 7.9* 7.8* 8.1* 8.5*  MG 1.9 1.8 1.7 1.9 1.9 1.9 2.1  PHOS 2.5 3.3 4.2  --  4.1 4.5  --    Liver Function Tests:  Recent Labs Lab 10/09/14 0400 10/10/14 0400 10/12/14 0334  AST 14* 15 34  ALT 15* 14* 36  ALKPHOS 55 54 70  BILITOT 0.4 0.6 0.5  PROT 4.8* 4.8* 5.2*  ALBUMIN 1.6* 1.6* 1.8*   No results for input(s): LIPASE, AMYLASE in the last 168 hours. No results for input(s): AMMONIA in the last 168 hours. CBC:  Recent Labs Lab 10/08/14 0424 10/09/14 0400 10/10/14 0845 10/12/14 0334 10/13/14 0420  WBC 8.5 6.3 12.5* 11.6* 10.2  NEUTROABS  --   --   --  8.2*  --   HGB 7.9* 6.9* 12.9* 10.7* 10.8*  HCT 23.8* 20.9* 38.0* 32.3* 32.7*  MCV 92.6 93.3 91.3 92.6 92.9  PLT 121* 105* 156 167 175   Cardiac Enzymes: No results for input(s): CKTOTAL, CKMB, CKMBINDEX, TROPONINI in the last 168 hours. BNP (last 3 results) No results for input(s): BNP in the last 8760 hours.  ProBNP (last 3 results) No results for input(s): PROBNP in the last 8760 hours.  CBG:  Recent Labs Lab 10/13/14 1545 10/13/14 1959 10/13/14 2354 10/14/14 0407 10/14/14 0755  GLUCAP 148* 142* 175* 147* 151*    Recent Results (from the past 240 hour(s))  Culture, respiratory (NON-Expectorated)     Status: None   Collection Time: 10/06/14  3:45 PM  Result Value Ref Range Status   Specimen Description  TRACHEAL ASPIRATE  Final   Special Requests NONE  Final   Gram Stain   Final    ABUNDANT WBC PRESENT, PREDOMINANTLY PMN FEW SQUAMOUS EPITHELIAL CELLS PRESENT ABUNDANT GRAM POSITIVE COCCI IN PAIRS Performed at [facility]    Culture   Final    ABUNDANT STREPTOCOCCUS PNEUMONIAE Performed at [facility]    Report Status 10/10/2014 FINAL  Final   Organism ID, Bacteria STREPTOCOCCUS PNEUMONIAE  Final      Susceptibility   Streptococcus pneumoniae - MIC (ETEST)*    CEFTRIAXONE 0.032 SENSITIVE Sensitive     LEVOFLOXACIN 0.75 SENSITIVE Sensitive     PENICILLIN 0.032 SENSITIVE Sensitive     * ABUNDANT STREPTOCOCCUS PNEUMONIAE     Studies:  Scheduled Meds:  Scheduled Meds: . budesonide  0.25 mg Nebulization Q12H  . calcium-vitamin D  1 tablet Per Tube Daily  . carvedilol  6.25 mg Oral BID WC  . famotidine  20 mg Oral BID  . insulin aspart  0-9 Units Subcutaneous 6 times per day    Time spent on care of this patient: 6mins   Domenic Polite , MD  [facility] Office  [phone] Pager - [phone]  On-Call/Text Page:      [email or web address]      password [email or web address]  If 7PM-7AM, please contact night-coverage [email or web address] Password [email or web address] 10/14/2014, 10:48 AM   LOS: 15 days

## 2014-10-14 NOTE — Progress Notes (Addendum)
PARENTERAL NUTRITION CONSULT NOTE - FOLLOW UP  Pharmacy Consult for TPN Indication: NPO, Protein Calorie Malnutrition  No Known Allergies  Patient Measurements: Height: 5\' 9"  (175.3 cm) Weight: 112 lb 8 oz (51.03 kg) IBW/kg (Calculated) : 70.7 Adjusted Body Weight:  Usual Weight:   Vital Signs: Temp: 98.4 F (36.9 C) (05/06 0415) Temp Source: Oral (05/06 0415) BP: 102/62 mmHg (05/06 0415) Pulse Rate: 75 (05/06 0415) Intake/Output from previous day: 05/05 0701 - 05/06 0700 In: 1518.1 [P.O.:390; I.V.:115.3; IV Piggyback:50; TPN:962.8] Out: 910 [Urine:910] Intake/Output from this shift:    Labs:  Recent Labs  10/12/14 0334 10/13/14 0420  WBC 11.6* 10.2  HGB 10.7* 10.8*  HCT 32.3* 32.7*  PLT 167 175     Recent Labs  10/12/14 0334 10/13/14 0420 10/14/14 0505  NA 129* 131* 131*  K 4.3 4.5 4.7  CL 99* 100* 100*  CO2 23 24 25   GLUCOSE 148* 168* 139*  BUN 20 22* 25*  CREATININE 0.52* 0.50* 0.52*  CALCIUM 7.8* 8.1* 8.5*  MG 1.9 1.9 2.1  PHOS 4.1 4.5  --   PROT 5.2*  --   --   ALBUMIN 1.8*  --   --   AST 34  --   --   ALT 36  --   --   ALKPHOS 70  --   --   BILITOT 0.5  --   --   PREALBUMIN  --  13.8*  --    Estimated Creatinine Clearance: 64.6 mL/min (by C-G formula based on Cr of 0.52).    Recent Labs  10/13/14 2354 10/14/14 0407 10/14/14 0755  GLUCAP 175* 147* 151*    Medications:  Scheduled:  . antiseptic oral rinse  7 mL Mouth Rinse QID  . budesonide  0.25 mg Nebulization Q12H  . calcium-vitamin D  1 tablet Per Tube Daily  . carvedilol  6.25 mg Oral BID WC  . chlorhexidine  15 mL Mouth Rinse BID  . heparin subcutaneous  5,000 Units Subcutaneous 3 times per day  . insulin aspart  0-9 Units Subcutaneous 6 times per day    Insulin Requirements in the past 24 hours:  6 units Sens SSI, no insulin in TPN  Current Nutrition:  Soft diet 5/6 Clinimix E 5/15 at 83 ml/hr + IVFE at 10 ml/hr  Assessment: 69 yo man s/p Subtotal colectomy with  ileorectal anastomosis for cecal wall mass and rectal mass on TPN for NPO status and PCM.  Nutritional Goals:  Kcal 1700-1950, protein >97 g Clinimix E 5/15 at goal rate 83 ml/hr and 20% lipids @ 10 ml/hr will provide 99 gm protein and 1916 Kcal/day  Surgeries/Procedures: 4/27 Subtotal colectomy with ileorectal anastomosis  GI: Having BMs, NGT out 5/3.  Tolerated CLs but poor appetite (doesn't like CL diet) & no PO intake recorded. No N/V- advanced to soft diet this AM. Plan to cont TPN until adequate PO intake.  Day#7 with no lipids per ICU protocol, but now on floor bed.  Pepcid in TPN  Endo: cbgs 121-148, 5 units SSI given; goal < 180 in ICU pt  Lytes: Na 131- stable. K 4.7 (goal >4 with afib)- upward trend with no additional K beyond TPN. Mg 2.1 (goal >2 ).  5/5: Phos 4.5, Ca 7.8, CoCa 9.56   Renal: Cr < 1 & stable, UOP 0.7 ml/kg/hr  Pulm:  RA. 5/5 CXR- worsened basilar consolidation, prob sm pleural effusion.  Pulmicort, Xopenex & Atrovent q6   Cards: AFib. VSS. Coreg  Hepatobil: LFTs ok, TG 101 on 5/2, PAlb 13.8- significant improvement  Neuro: no issues  ID: Zosyn 4/28>>5/2 WBC 10.2- improving trend, AFeb  TPN Access: CVC LIJ 4/28 TPN start date: 4/30   Plan:  - Continue Clinimix E 5/15 at goal rate of 83 ml/hr which provides 99.6 gm protein (100% goal) and 1414 Kcals (~ 83 % Kcal goal). - Add lipids 5/7 if PO intake does not improve with Soft Diet  - continue SSI - Pepcid in TPN- can stop once TPN d/c'd & full diet because was not on pta  Gracy Bruins, PharmD [facility]   10/14/14 (0900)- addendum:  Notified by Lovena Le, RN of plan to d/c TPN today.  There is no insulin in TPN, therefore do not need to wean over several hours.  1-  Decrease TPN to 78ml/hr x 1hr, then d/c 2-  Resume Pepcid 20mg  po q12, as not at full diet yet 3-  D/C all TPN labs  Gracy Bruins, PharmD Clinical  Pharmacist [facility]

## 2014-10-14 NOTE — Progress Notes (Signed)
ANTICOAGULATION CONSULT NOTE - Initial Consult  Pharmacy Consult for Xarelto Indication: atrial fibrillation  No Known Allergies  Patient Measurements: Height: 5\' 9"  (175.3 cm) Weight: 112 lb 8 oz (51.03 kg) IBW/kg (Calculated) : 70.7 Heparin Dosing Weight:   Vital Signs: Temp: 98.4 F (36.9 C) (05/06 0415) Temp Source: Oral (05/06 0415) BP: 102/62 mmHg (05/06 0415) Pulse Rate: 75 (05/06 0415)  Labs:  Recent Labs  10/12/14 0334 10/13/14 0420 10/14/14 0505  HGB 10.7* 10.8*  --   HCT 32.3* 32.7*  --   PLT 167 175  --   CREATININE 0.52* 0.50* 0.52*    Estimated Creatinine Clearance: 64.6 mL/min (by C-G formula based on Cr of 0.52).   Medical History: Past Medical History  Diagnosis Date  . Depression   . Hypomagnesemia   . Dyspepsia   . Fall at nursing home 05/27/2013    "slipped on water; said I broke my right hip" (05/27/2013)  . Migraine     "used to have them bad; haven't had one in awhile" (05/27/2013)  . Gout attack     "not too long ago; in my right foot" (05/27/2013)  . Complete heart block     reason for pacemaker/notes 05/27/2013  . Atrial fibrillation     [name] 05/27/2013  . HLD (hyperlipidemia)   . GIB (gastrointestinal bleeding)   . BPH (benign prostatic hyperplasia)     Medications:  Scheduled:  . budesonide  0.25 mg Nebulization Q12H  . calcium-vitamin D  1 tablet Per Tube Daily  . carvedilol  6.25 mg Oral BID WC  . famotidine  20 mg Oral BID  . insulin aspart  0-9 Units Subcutaneous 6 times per day    Assessment: 69yo male know to pharmacy, s/p subtotal colectomy with ileorectal anastamosis on 4/27.  Xarelto has been on hold due to rectal bleeding which led to the surgery & also a small RP bleed, now  to resume.  I also discussed this with Beryle Beams PA with surgery this AM, who stated at the time that this was OK with them as well.  Estimated CrCl ~60, no dosage adjustment needed.  Hg 10.8 on 5/5.  Heparin SQ has been  d/c'd.  Goal of Therapy:  prevention of stroke Monitor platelets by anticoagulation protocol: Yes   Plan:  Xarelto 20mg  daily with dinner Watch for s/s of bleeding CBC q48 to trend Hg  Gracy Bruins, PharmD Clinical Pharmacist [facility]

## 2014-10-14 NOTE — Progress Notes (Signed)
Patient ID: [name], male   DOB: [birth year], 69 y.o.   MRN: [id number] 9 Days Post-Op  Subjective: Pt feels ok today.  Hasn't eaten liquids because he hates them.  Ready for solid food.  Has no teeth, but says he still eats solid food. Objective: Vital signs in last 24 hours: Temp:  [97.6 F (36.4 C)-99.1 F (37.3 C)] 98.4 F (36.9 C) (05/06 0415) Pulse Rate:  [69-103] 75 (05/06 0415) Resp:  [16-21] 20 (05/06 0415) BP: (102-140)/(62-82) 102/62 mmHg (05/06 0415) SpO2:  [92 %-100 %] 95 % (05/06 0415) Last BM Date: 10/13/14  Intake/Output from previous day: 05/05 0701 - 05/06 0700 In: 1518.1 [P.O.:390; I.V.:115.3; IV Piggyback:50; TPN:962.8] Out: 910 [Urine:910] Intake/Output this shift:    PE: Abd: soft, appropriately tender, +BS, incision c/d/i with staples Heart: regular  Lab Results:   Recent Labs  10/12/14 0334 10/13/14 0420  WBC 11.6* 10.2  HGB 10.7* 10.8*  HCT 32.3* 32.7*  PLT 167 175   BMET  Recent Labs  10/13/14 0420 10/14/14 0505  NA 131* 131*  K 4.5 4.7  CL 100* 100*  CO2 24 25  GLUCOSE 168* 139*  BUN 22* 25*  CREATININE 0.50* 0.52*  CALCIUM 8.1* 8.5*   PT/INR No results for input(s): LABPROT, INR in the last 72 hours. CMP     Component Value Date/Time   NA 131* 10/14/2014 0505   K 4.7 10/14/2014 0505   CL 100* 10/14/2014 0505   CO2 25 10/14/2014 0505   GLUCOSE 139* 10/14/2014 0505   BUN 25* 10/14/2014 0505   CREATININE 0.52* 10/14/2014 0505   CALCIUM 8.5* 10/14/2014 0505   PROT 5.2* 10/12/2014 0334   ALBUMIN 1.8* 10/12/2014 0334   AST 34 10/12/2014 0334   ALT 36 10/12/2014 0334   ALKPHOS 70 10/12/2014 0334   BILITOT 0.5 10/12/2014 0334   GFRNONAA >60 10/14/2014 0505   GFRAA >60 10/14/2014 0505   Lipase     Component Value Date/Time   LIPASE 40 09/30/2014 0253       Studies/Results: Dg Chest Port 1 View  10/13/2014   CLINICAL DATA:  Rectal mass.  EXAM: PORTABLE CHEST - 1 VIEW  COMPARISON:  10/09/2014  FINDINGS: The  nasogastric tube has been removed. There are intact appearances of the transvenous leads. The left jugular central line extends into the SVC. Basilar airspace opacities are present bilaterally, and there probably also is a left pleural effusion. There is worsened consolidation in the medial bases.  IMPRESSION: Worsened basilar consolidation and probable small pleural effusion.   Electronically Signed   By: Andreas Newport M.D.   On: 10/13/2014 06:00    Anti-infectives: Anti-infectives    Start     Dose/Rate Route Frequency Ordered Stop   10/06/14 1445  piperacillin-tazobactam (ZOSYN) IVPB 3.375 g  Status:  Discontinued     3.375 g 12.5 mL/hr over 240 Minutes Intravenous 3 times per day 10/06/14 1443 10/10/14 1232   10/05/14 1117  cefoTEtan in Dextrose 5% (CEFOTAN) 2-2.08 GM-% IVPB    Comments:  Ronnald Ramp, Tomika   : cabinet override      10/05/14 1117 10/05/14 2329   10/05/14 1045  cefoTEtan (CEFOTAN) 2 g in dextrose 5 % 50 mL IVPB     2 g 100 mL/hr over 30 Minutes Intravenous 30 min pre-op 10/04/14 1548 10/05/14 1140       Assessment/Plan  Cecal and rectal mass POD#9 subtotal colectomy with ileorectal anastomosis---Dr. 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 He will need outpatient oncology follow up VTE prophylaxis-SCD/heparin FEN- soft diet today, wean TNA to off PCM-add ensure dispo- patient is doing well surgically.  My concern for him is that he lives by himself with absolutely no support.  He has no family or any contacts in our system.  PT has recommended HH or 24hr supervision.  OT recommended SNF/24 hr supervision.  He does not have supervision.  With his MR/learning disability, I am concerned the patient does not understand his diagnosis and will  not be able to appropriately juggle all of his follow ups or the importance of these.  I have asked social work to get involved to guide in disposition or what can be offered.  LOS: 15 days    Bland Rudzinski E 10/14/2014, 8:58 AM Pager: [phone]

## 2014-10-15 LAB — CBC
HCT: 34.8 % — ABNORMAL LOW (ref 39.0–52.0)
Hemoglobin: 11.3 g/dL — ABNORMAL LOW (ref 13.0–17.0)
MCH: 30.4 pg (ref 26.0–34.0)
MCHC: 32.5 g/dL (ref 30.0–36.0)
MCV: 93.5 fL (ref 78.0–100.0)
PLATELETS: 280 10*3/uL (ref 150–400)
RBC: 3.72 MIL/uL — AB (ref 4.22–5.81)
RDW: 14.2 % (ref 11.5–15.5)
WBC: 14.5 10*3/uL — AB (ref 4.0–10.5)

## 2014-10-15 LAB — CLOSTRIDIUM DIFFICILE BY PCR: CDIFFPCR: NEGATIVE

## 2014-10-15 LAB — GLUCOSE, CAPILLARY
GLUCOSE-CAPILLARY: 95 mg/dL (ref 70–99)
GLUCOSE-CAPILLARY: 103 mg/dL — AB (ref 70–99)
GLUCOSE-CAPILLARY: 105 mg/dL — AB (ref 70–99)
Glucose-Capillary: 89 mg/dL (ref 70–99)
Glucose-Capillary: 95 mg/dL (ref 70–99)
Glucose-Capillary: 95 mg/dL (ref 70–99)

## 2014-10-15 MED ORDER — LEVOFLOXACIN IN D5W 750 MG/150ML IV SOLN
750.0000 mg | INTRAVENOUS | Status: DC
  Administered 2014-10-15 – 2014-10-16 (×2): 750 mg via INTRAVENOUS
  Filled 2014-10-15 (×4): qty 150

## 2014-10-15 NOTE — Progress Notes (Signed)
Progress Note  JUSITN SALSGIVER VEL:[id number] DOB: [birth year] DOA: 09/29/2014 PCP: [name], MD   [facility] TRANSFER 5/6  Admit HPI / Brief Narrative: [name] is a 69 y.o. 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 Presented with rectal bleeding. Patient was recently hospitalized from 4/16 to 4/18 because of GI bleeding. He had EGD on 4/18, which was negative for active bleeding. Gastroenterology recommended proceeding with a colonoscopy, however since bleeding has resolved, patient did not agree. His Xarelto was restarted on 4/19. He started having rectal bleeding again today. He had 4 times of rectal bleeding with bright red blood per patient. He does not have lightheadedness, chest pain or shortness breath. Patient reports having severe abdominal pain over suprapubic area. He has mild nausea, but no vomiting or diarrhea.  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  -Stable, monitor closely  Acute blood loss Anemia  - resolved with subtotal colectomy 4/27  Thrombocytopenia  -resolved  Protein calorie malnutrition -weaned TNA, advance diet, supplement  Metabolic encephalopathy -Resolved   Code Status: FULL Family Communication: no family present at time of exam Disposition Plan: ALF tomorrow if stable, CSW consulted   Consultants: Dr.Douglas B McQuaid (PCCM) Dr.Douglas Ninfa Linden (CCS)     Culture 4/22 MRSA by PCR negative 4/28 tracheal aspirate positive strep pneumo   Antibiotics: Zosyn 4/28>>>5/2  DVT prophylaxis: Subcutaneous heparin   Devices   LINES / TUBES:  CVL L IJ CVL 4/28>>>    Continuous Infusions:    Objective: VITAL SIGNS: Temp: 98.8 F (37.1 C) (05/07 0611) Temp Source: Oral (05/07 0611) BP: 111/65 mmHg (05/07 0611) Pulse Rate: 81 (05/07 0611) SPO2; FIO2:   Intake/Output Summary (Last 24 hours) at 10/15/14 1053 Last data filed at 10/15/14 0848  Gross per 24 hour  Intake   1160 ml  Output   1550 ml  Net   -390 ml     Exam: General:  A/O 4, cachectic, no distress Lungs: CTAB, diminished at bases Cardiovascular: Regular rate and rhythm without murmur gallop or rub normal S1 and S2; pacer present right chest wall Abdomen: Nontender, nondistended, soft, bowel sounds  positive, no rebound, no ascites, no appreciable mass, midline vertical abdominal incision closed with staples, negative sign of  infection Extremities: No significant cyanosis, clubbing, or edema bilateral lower extremities  Data Reviewed: Basic Metabolic Panel:  Recent Labs Lab 10/09/14 0400 10/10/14 0400 10/11/14 0400 10/12/14 0334 10/13/14 0420 10/14/14 0505  NA 137 136 134* 129* 131* 131*  K 3.2* 3.7 3.7 4.3 4.5 4.7  CL 101 104 101 99* 100* 100*  CO2 27 23 23 23 24 25   GLUCOSE 135* 175* 132* 148* 168* 139*  BUN 12 21* 20 20 22* 25*  CREATININE 0.65 0.62 0.51* 0.52* 0.50* 0.52*  CALCIUM 7.7* 7.9* 7.9* 7.8* 8.1* 8.5*  MG 1.8 1.7 1.9 1.9 1.9 2.1  PHOS 3.3 4.2  --  4.1 4.5  --    Liver Function Tests:  Recent Labs Lab 10/09/14 0400 10/10/14 0400 10/12/14 0334  AST 14* 15 34  ALT 15* 14* 36  ALKPHOS 55 54 70  BILITOT 0.4 0.6 0.5  PROT 4.8* 4.8* 5.2*  ALBUMIN 1.6* 1.6* 1.8*   No results for input(s): LIPASE, AMYLASE in the last 168 hours. No results for input(s): AMMONIA in the last 168 hours. CBC:  Recent Labs Lab 10/09/14 0400 10/10/14 0845 10/12/14 0334 10/13/14 0420 10/15/14 0416  WBC 6.3 12.5* 11.6* 10.2 14.5*  NEUTROABS  --   --  8.2*  --   --   HGB 6.9* 12.9* 10.7* 10.8* 11.3*  HCT 20.9* 38.0* 32.3* 32.7* 34.8*  MCV 93.3 91.3 92.6 92.9 93.5  PLT 105* 156 167 175 280   Cardiac Enzymes: No results for input(s): CKTOTAL, CKMB, CKMBINDEX, TROPONINI in the last 168 hours. BNP (last 3 results) No results for input(s): BNP in the last 8760 hours.  ProBNP (last 3 results) No results for input(s): PROBNP in the last 8760 hours.  CBG:  Recent Labs Lab 10/14/14 1607 10/14/14 1958 10/14/14 2355 10/15/14 0344 10/15/14 0716  GLUCAP 133* 112* 115* 95 95    Recent Results (from the past 240 hour(s))  Culture, respiratory (NON-Expectorated)     Status: None   Collection Time: 10/06/14  3:45 PM  Result Value Ref Range Status   Specimen Description TRACHEAL ASPIRATE  Final   Special Requests NONE  Final   Gram Stain   Final    ABUNDANT WBC PRESENT, PREDOMINANTLY PMN FEW  SQUAMOUS EPITHELIAL CELLS PRESENT ABUNDANT GRAM POSITIVE COCCI IN PAIRS Performed at [facility]    Culture   Final    ABUNDANT STREPTOCOCCUS PNEUMONIAE Performed at [facility]    Report Status 10/10/2014 FINAL  Final   Organism ID, Bacteria STREPTOCOCCUS PNEUMONIAE  Final      Susceptibility   Streptococcus pneumoniae - MIC (ETEST)*    CEFTRIAXONE 0.032 SENSITIVE Sensitive     LEVOFLOXACIN 0.75 SENSITIVE Sensitive     PENICILLIN 0.032 SENSITIVE Sensitive     * ABUNDANT STREPTOCOCCUS PNEUMONIAE  Clostridium Difficile by PCR     Status: None   Collection Time: 10/15/14  7:17 AM  Result Value Ref Range Status   C difficile by pcr NEGATIVE NEGATIVE Final     Studies:  Scheduled Meds:  Scheduled Meds: . budesonide  0.25 mg Nebulization Q12H  . calcium-vitamin D  1 tablet Per Tube Daily  . carvedilol  6.25 mg Oral BID WC  . famotidine  20 mg Oral BID  . feeding supplement (RESOURCE BREEZE)  1 Container Oral TID BM  . insulin aspart  0-9 Units Subcutaneous 6 times  per day  . levofloxacin (LEVAQUIN) IV  750 mg Intravenous Q24H  . rivaroxaban  20 mg Oral Q supper    Time spent on care of this patient: 37mins   Hayslee Casebolt , MD  [facility] Office  [phone] Pager - [phone]  On-Call/Text Page:      [email or web address]      password [email or web address]  If 7PM-7AM, please contact night-coverage [email or web address] Password [email or web address] 10/15/2014, 10:53 AM   LOS: 16 days

## 2014-10-15 NOTE — Progress Notes (Signed)
Patient ID: [name], male   DOB: 07-20-1945, 69 y.o.   MRN: [id number] 10 Days Post-Op  Subjective: Pt feels ok today. Eating ok.  Having BMs.    Objective: Vital signs in last 24 hours: Temp:  [98.4 F (36.9 C)-99.1 F (37.3 C)] 98.8 F (37.1 C) (05/07 0611) Pulse Rate:  [74-83] 81 (05/07 0611) Resp:  [19-20] 20 (05/07 0611) BP: (111-132)/(65-78) 111/65 mmHg (05/07 0611) SpO2:  [96 %-97 %] 96 % (05/07 0611) Last BM Date: 10/14/14  Intake/Output from previous day: 05/06 0701 - 05/07 0700 In: 1160 [P.O.:1080; TPN:80] Out: 650 [Urine:650] Intake/Output this shift: Total I/O In: 120 [P.O.:120] Out: 900 [Urine:900]  PE: Abd: soft, appropriately tender, +BS, ND, incision c/d/i with staples present  Lab Results:   Recent Labs  10/13/14 0420 10/15/14 0416  WBC 10.2 14.5*  HGB 10.8* 11.3*  HCT 32.7* 34.8*  PLT 175 280   BMET  Recent Labs  10/13/14 0420 10/14/14 0505  NA 131* 131*  K 4.5 4.7  CL 100* 100*  CO2 24 25  GLUCOSE 168* 139*  BUN 22* 25*  CREATININE 0.50* 0.52*  CALCIUM 8.1* 8.5*   PT/INR No results for input(s): LABPROT, INR in the last 72 hours. CMP     Component Value Date/Time   NA 131* 10/14/2014 0505   K 4.7 10/14/2014 0505   CL 100* 10/14/2014 0505   CO2 25 10/14/2014 0505   GLUCOSE 139* 10/14/2014 0505   BUN 25* 10/14/2014 0505   CREATININE 0.52* 10/14/2014 0505   CALCIUM 8.5* 10/14/2014 0505   PROT 5.2* 10/12/2014 0334   ALBUMIN 1.8* 10/12/2014 0334   AST 34 10/12/2014 0334   ALT 36 10/12/2014 0334   ALKPHOS 70 10/12/2014 0334   BILITOT 0.5 10/12/2014 0334   GFRNONAA >60 10/14/2014 0505   GFRAA >60 10/14/2014 0505   Lipase     Component Value Date/Time   LIPASE 40 09/30/2014 0253       Studies/Results: No results found.  Anti-infectives: Anti-infectives    Start     Dose/Rate Route Frequency Ordered Stop   10/06/14 1445  piperacillin-tazobactam (ZOSYN) IVPB 3.375 g  Status:  Discontinued     3.375 g 12.5 mL/hr  over 240 Minutes Intravenous 3 times per day 10/06/14 1443 10/10/14 1232   10/05/14 1117  cefoTEtan in Dextrose 5% (CEFOTAN) 2-2.08 GM-% IVPB    Comments:  Jones, Tomika   : cabinet override      10/05/14 1117 10/05/14 2329   10/05/14 1045  cefoTEtan (CEFOTAN) 2 g in dextrose 5 % 50 mL IVPB     2 g 100 mL/hr over 30 Minutes Intravenous 30 min pre-op 10/04/14 1548 10/05/14 1140       Assessment/Plan   Cecal and rectal mass POD#10 subtotal colectomy with ileorectal anastomosis---Dr. 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 He will need outpatient oncology follow up.  I have contacted my office to have them set this up for him as an outpatient, as well as his surgical follow up with Dr. Georgette Dover -WBC went up to 14K today.  He is being checked for C diff.  He has no colon any longer, so he will likely have between 5-10 BMs possibly a day and this would be normal for him.  He did has positive cultures from his sputum, so he may need further abx therapy for this, per  medicine's note VTE prophylaxis-SCD/heparin FEN- soft diet today PCM-add ensure dispo- to [facility], per medicine Surgically stable for dc when medically stable  LOS: 16 days    [name] E 10/15/2014, 9:24 AM Pager: [phone]

## 2014-10-16 LAB — BASIC METABOLIC PANEL
ANION GAP: 9 (ref 5–15)
BUN: 20 mg/dL (ref 6–20)
CALCIUM: 8.9 mg/dL (ref 8.9–10.3)
CO2: 23 mmol/L (ref 22–32)
CREATININE: 0.64 mg/dL (ref 0.61–1.24)
Chloride: 98 mmol/L — ABNORMAL LOW (ref 101–111)
GFR calc non Af Amer: >60 mL/min (ref >60)
Glucose, Bld: 95 mg/dL (ref 70–99)
Potassium: 4.4 mmol/L (ref 3.5–5.1)
Sodium: 130 mmol/L — ABNORMAL LOW (ref 135–145)

## 2014-10-16 LAB — CBC
HCT: 32.5 % — ABNORMAL LOW (ref 39.0–52.0)
Hemoglobin: 10.7 g/dL — ABNORMAL LOW (ref 13.0–17.0)
MCH: 30.7 pg (ref 26.0–34.0)
MCHC: 32.9 g/dL (ref 30.0–36.0)
MCV: 93.1 fL (ref 78.0–100.0)
Platelets: 260 10*3/uL (ref 150–400)
RBC: 3.49 MIL/uL — ABNORMAL LOW (ref 4.22–5.81)
RDW: 14.2 % (ref 11.5–15.5)
WBC: 11.4 10*3/uL — ABNORMAL HIGH (ref 4.0–10.5)

## 2014-10-16 LAB — GLUCOSE, CAPILLARY
GLUCOSE-CAPILLARY: 108 mg/dL — AB (ref 70–99)
Glucose-Capillary: 87 mg/dL (ref 70–99)
Glucose-Capillary: 96 mg/dL (ref 70–99)
Glucose-Capillary: 89 mg/dL (ref 70–99)
Glucose-Capillary: 96 mg/dL (ref 70–99)

## 2014-10-16 MED ORDER — LOPERAMIDE HCL 2 MG PO CAPS
2.0000 mg | ORAL_CAPSULE | ORAL | Status: DC | PRN
  Administered 2014-10-16: 2 mg via ORAL
  Filled 2014-10-16: qty 1

## 2014-10-16 NOTE — Progress Notes (Signed)
Patient ID: [name], male   DOB: 1946/02/02, 69 y.o.   MRN: [id number] 11 Days Post-Op  Subjective: Pt c/o multiple stools.  Eating his breakfast this morning.    Objective: Vital signs in last 24 hours: Temp:  [98.4 F (36.9 C)-98.5 F (36.9 C)] 98.5 F (36.9 C) (05/08 0555) Pulse Rate:  [70-83] 79 (05/08 0555) Resp:  [18-20] 20 (05/08 0555) BP: (106-119)/(62-67) 106/66 mmHg (05/08 0555) SpO2:  [95 %-97 %] 97 % (05/08 0555) Last BM Date: 10/15/14  Intake/Output from previous day: 05/07 0701 - 05/08 0700 In: 600 [P.O.:600] Out: 2525 [Urine:2525] Intake/Output this shift:    PE: Abd: soft, incision healing well, staples out, +BS, ND, appropriately tender  Lab Results:   Recent Labs  10/15/14 0416 10/16/14 0408  WBC 14.5* 11.4*  HGB 11.3* 10.7*  HCT 34.8* 32.5*  PLT 280 260   BMET  Recent Labs  10/14/14 0505 10/16/14 0408  NA 131* 130*  K 4.7 4.4  CL 100* 98*  CO2 25 23  GLUCOSE 139* 95  BUN 25* 20  CREATININE 0.52* 0.64  CALCIUM 8.5* 8.9   PT/INR No results for input(s): LABPROT, INR in the last 72 hours. CMP     Component Value Date/Time   NA 130* 10/16/2014 0408   K 4.4 10/16/2014 0408   CL 98* 10/16/2014 0408   CO2 23 10/16/2014 0408   GLUCOSE 95 10/16/2014 0408   BUN 20 10/16/2014 0408   CREATININE 0.64 10/16/2014 0408   CALCIUM 8.9 10/16/2014 0408   PROT 5.2* 10/12/2014 0334   ALBUMIN 1.8* 10/12/2014 0334   AST 34 10/12/2014 0334   ALT 36 10/12/2014 0334   ALKPHOS 70 10/12/2014 0334   BILITOT 0.5 10/12/2014 0334   GFRNONAA >60 10/16/2014 0408   GFRAA >60 10/16/2014 0408   Lipase     Component Value Date/Time   LIPASE 40 09/30/2014 0253       Studies/Results: No results found.  Anti-infectives: Anti-infectives    Start     Dose/Rate Route Frequency Ordered Stop   10/15/14 1130  levofloxacin (LEVAQUIN) IVPB 750 mg     750 mg 100 mL/hr over 90 Minutes Intravenous Every 24 hours 10/15/14 1046     10/06/14 1445   piperacillin-tazobactam (ZOSYN) IVPB 3.375 g  Status:  Discontinued     3.375 g 12.5 mL/hr over 240 Minutes Intravenous 3 times per day 10/06/14 1443 10/10/14 1232   10/05/14 1117  cefoTEtan in Dextrose 5% (CEFOTAN) 2-2.08 GM-% IVPB    Comments:  [name], [name]   : cabinet override      10/05/14 1117 10/05/14 2329   10/05/14 1045  cefoTEtan (CEFOTAN) 2 g in dextrose 5 % 50 mL IVPB     2 g 100 mL/hr over 30 Minutes Intravenous 30 min pre-op 10/04/14 1548 10/05/14 1140       Assessment/Plan   [name] POD#11 subtotal colectomy with ileorectal anastomosis---Dr. 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 He will need outpatient oncology follow up. I have contacted my office to have them set this up for him as an outpatient, as well as his surgical follow up with Dr. Georgette Dover VTE prophylaxis-SCD/heparin FEN- soft diet, will add imodium for diarrhea.  (d/w patient again that he has no colon and this will likely be his new norm) PCM-add ensure dispo- to [facility], per medicine  ADDENDUM: Patient  has some BRB per rectum with a BM overnight.  Just restarted on xarelto.  Should probably observe for another day to make sure he does not continue to bleed.  LOS: 17 days    Hassie Mandt E 10/16/2014, 8:34 AM Pager: [phone]

## 2014-10-16 NOTE — Progress Notes (Signed)
Progress Note  [name]:[id number] DOB: [birth year] DOA: 09/29/2014 PCP: [name], MD   [facility] TRANSFER 5/6  Admit HPI / Brief Narrative: [name] is a 69 y.o. 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 Presented with rectal bleeding. Patient was recently hospitalized from 4/16 to 4/18 because of GI bleeding. He had EGD on 4/18, which was negative for active bleeding. Gastroenterology recommended proceeding with a colonoscopy, however since bleeding has resolved, patient did not agree. His Xarelto was restarted on 4/19. He started having rectal bleeding again today. He had 4 times of rectal bleeding with bright red blood per patient. He does not have lightheadedness, chest pain or shortness breath. Patient reports having severe abdominal pain over suprapubic area. He has mild nausea, but no vomiting or diarrhea.  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  -Stable, monitor closely  Acute blood loss Anemia  - resolved with subtotal colectomy 4/27  Thrombocytopenia  -resolved  Protein calorie malnutrition -weaned TNA, advance diet, supplement  Metabolic encephalopathy -Resolved   Code Status: FULL Family Communication: no family present at time of exam Disposition Plan: ALF tomorrow if stable, CSW consulted   Consultants: Dr.Douglas B McQuaid (PCCM) Dr.Douglas Ninfa Linden (CCS)     Culture 4/22 MRSA by PCR negative 4/28 tracheal aspirate positive strep pneumo   Antibiotics: Zosyn 4/28>>>5/2  DVT prophylaxis: Subcutaneous heparin   Devices   LINES / TUBES:  CVL L IJ CVL 4/28>>>    Continuous Infusions:    Objective: VITAL SIGNS: Temp: 98.5 F (36.9 C) (05/08 0555) Temp Source: Oral (05/08 0555) BP: 106/66 mmHg (05/08 0555) Pulse Rate: 79 (05/08 0555) SPO2; FIO2:   Intake/Output Summary (Last 24 hours) at 10/16/14 1024 Last data filed at 10/16/14 0555  Gross per 24 hour  Intake    480 ml  Output   1625 ml  Net  -1145 ml     Exam: General:  A/O 4, cachectic, no distress Lungs: CTAB, diminished at bases Cardiovascular: Regular rate and rhythm without murmur gallop or rub  normal S1 and S2; pacer present right chest wall Abdomen: Nontender, nondistended, soft, bowel sounds positive, no rebound, no ascites, no  appreciable mass, midline vertical abdominal incision,  Staples out  Extremities: No significant cyanosis, clubbing, or edema bilateral lower extremities  Data Reviewed: Basic Metabolic Panel:  Recent Labs Lab 10/10/14 0400 10/11/14 0400 10/12/14 0334 10/13/14 0420 10/14/14 0505 10/16/14 0408  NA 136 134* 129* 131* 131* 130*  K 3.7 3.7 4.3 4.5 4.7 4.4  CL 104 101 99* 100* 100* 98*  CO2 23 23 23 24 25 23   GLUCOSE 175* 132* 148* 168* 139* 95  BUN 21* 20 20 22* 25* 20  CREATININE 0.62 0.51* 0.52* 0.50* 0.52* 0.64  CALCIUM 7.9* 7.9* 7.8* 8.1* 8.5* 8.9  MG 1.7 1.9 1.9 1.9 2.1  --   PHOS 4.2  --  4.1 4.5  --   --    Liver Function Tests:  Recent Labs Lab 10/10/14 0400 10/12/14 0334  AST 15 34  ALT 14* 36  ALKPHOS 54 70  BILITOT 0.6 0.5  PROT 4.8* 5.2*  ALBUMIN 1.6* 1.8*   No results for input(s): LIPASE, AMYLASE in the last 168 hours. No results for input(s): AMMONIA in the last 168 hours. CBC:  Recent Labs Lab 10/10/14 0845 10/12/14 0334 10/13/14 0420 10/15/14 0416 10/16/14 0408  WBC 12.5* 11.6* 10.2 14.5* 11.4*  NEUTROABS  --  8.2*  --   --   --   HGB 12.9* 10.7* 10.8* 11.3* 10.7*  HCT 38.0* 32.3* 32.7* 34.8* 32.5*  MCV 91.3 92.6 92.9 93.5 93.1  PLT 156 167 175 280 260   Cardiac Enzymes: No results for input(s): CKTOTAL, CKMB, CKMBINDEX, TROPONINI in the last 168 hours. BNP (last 3 results) No results for input(s): BNP in the last 8760 hours.  ProBNP (last 3 results) No results for input(s): PROBNP in the last 8760 hours.  CBG:  Recent Labs Lab 10/15/14 1629 10/15/14 1937 10/15/14 2343 10/16/14 0338 10/16/14 0808  GLUCAP 95 105* 89 87 96    Recent Results (from the past 240 hour(s))  Culture, respiratory (NON-Expectorated)     Status: None   Collection Time: 10/06/14  3:45 PM  Result Value Ref Range Status   Specimen Description TRACHEAL ASPIRATE  Final   Special Requests NONE  Final   Gram Stain   Final    ABUNDANT WBC PRESENT,  PREDOMINANTLY PMN FEW SQUAMOUS EPITHELIAL CELLS PRESENT ABUNDANT GRAM POSITIVE COCCI IN PAIRS Performed at [facility]    Culture   Final    ABUNDANT STREPTOCOCCUS PNEUMONIAE Performed at [facility]    Report Status 10/10/2014 FINAL  Final   Organism ID, Bacteria STREPTOCOCCUS PNEUMONIAE  Final      Susceptibility   Streptococcus pneumoniae - MIC (ETEST)*    CEFTRIAXONE 0.032 SENSITIVE Sensitive     LEVOFLOXACIN 0.75 SENSITIVE Sensitive     PENICILLIN 0.032 SENSITIVE Sensitive     * ABUNDANT STREPTOCOCCUS PNEUMONIAE  Clostridium Difficile by PCR     Status: None   Collection Time: 10/15/14  7:17 AM  Result Value Ref Range Status   C difficile by pcr NEGATIVE NEGATIVE Final     Studies:  Scheduled Meds:  Scheduled Meds: . budesonide  0.25 mg Nebulization Q12H  . calcium-vitamin D  1 tablet Per Tube Daily  . carvedilol  6.25 mg Oral BID WC  . famotidine  20 mg Oral BID  . feeding supplement (RESOURCE BREEZE)  1 Container Oral TID BM  .  insulin aspart  0-9 Units Subcutaneous 6 times per day  . levofloxacin (LEVAQUIN) IV  750 mg Intravenous Q24H  . rivaroxaban  20 mg Oral Q supper    Time spent on care of this patient: 49mins   Avrohom Mckelvin , MD  [facility] Office  [phone] Pager - [phone]  On-Call/Text Page:      [email or web address]      password [email or web address]  If 7PM-7AM, please contact night-coverage [email or web address] Password [email or web address] 10/16/2014, 10:24 AM   LOS: 17 days

## 2014-10-17 ENCOUNTER — Other Ambulatory Visit: Payer: Self-pay | Admitting: Surgery

## 2014-10-17 DIAGNOSIS — C8590 Non-Hodgkin lymphoma, unspecified, unspecified site: Secondary | ICD-10-CM

## 2014-10-17 LAB — CBC
HCT: 31.8 % — ABNORMAL LOW (ref 39.0–52.0)
HEMOGLOBIN: 10.4 g/dL — AB (ref 13.0–17.0)
MCH: 30.4 pg (ref 26.0–34.0)
MCHC: 32.7 g/dL (ref 30.0–36.0)
MCV: 93 fL (ref 78.0–100.0)
Platelets: 253 10*3/uL (ref 150–400)
RBC: 3.42 MIL/uL — ABNORMAL LOW (ref 4.22–5.81)
RDW: 14.1 % (ref 11.5–15.5)
WBC: 10.6 10*3/uL — ABNORMAL HIGH (ref 4.0–10.5)

## 2014-10-17 LAB — BASIC METABOLIC PANEL
ANION GAP: 10 (ref 5–15)
BUN: 22 mg/dL — ABNORMAL HIGH (ref 6–20)
CHLORIDE: 96 mmol/L — AB (ref 101–111)
CO2: 22 mmol/L (ref 22–32)
Calcium: 8.7 mg/dL — ABNORMAL LOW (ref 8.9–10.3)
Creatinine, Ser: 0.63 mg/dL (ref 0.61–1.24)
GFR calc non Af Amer: >60 mL/min (ref >60)
Glucose, Bld: 108 mg/dL — ABNORMAL HIGH (ref 70–99)
POTASSIUM: 4.1 mmol/L (ref 3.5–5.1)
SODIUM: 128 mmol/L — AB (ref 135–145)

## 2014-10-17 LAB — GLUCOSE, CAPILLARY
GLUCOSE-CAPILLARY: 105 mg/dL — AB (ref 70–99)
GLUCOSE-CAPILLARY: 98 mg/dL (ref 70–99)
Glucose-Capillary: 116 mg/dL — ABNORMAL HIGH (ref 70–99)
Glucose-Capillary: 99 mg/dL (ref 70–99)

## 2014-10-17 MED ORDER — LOPERAMIDE HCL 2 MG PO CAPS: 2.0000 mg | ORAL_CAPSULE | ORAL | Status: DC | PRN

## 2014-10-17 MED ORDER — OXYCODONE-ACETAMINOPHEN 5-325 MG PO TABS: 1.0000 | ORAL_TABLET | ORAL | Status: DC | PRN

## 2014-10-17 NOTE — Care Management Note (Signed)
Case Management Note  Patient Details  Name: [name] MRN: [id number] Date of Birth: 1946-05-28  Subjective/Objective:                    Action/Plan:   Expected Discharge Date:  10/17/14                [facility] is [facility] given referral  Expected Discharge Plan:  Assisted Living / Rest Home  In-House Referral:  Clinical Social Work  Discharge planning Services  CM Consult  Post Acute Care Choice:  Home Health Choice offered to:     DME Arranged:    DME Agency:     HH Arranged:  RN, PT, OT, Nurse's Aide [place] Agency:  [facility]  Status of Service:  Completed, signed off  Medicare Important Message Given:    Date Medicare IM Given:  10/14/14 Medicare IM give by:  Magdalen Spatz RN BSN  Date Additional Medicare IM Given:  10/17/14 Additional Medicare Important Message give by:  Magdalen Spatz RN BSN   If discussed at [place] of Stay Meetings, dates discussed:    Additional Comments:  Marilu Favre, RN 10/17/2014, 10:30 AM

## 2014-10-17 NOTE — Progress Notes (Signed)
[name] to be D/C'd to [facility] per MD order.  Discussed with the patient and all questions fully answered.  VSS. Surgical site clean, dry, intact with no sign of infection. Steri-strips in place.  IV catheter discontinued intact. Site without signs and symptoms of complications. Dressing and pressure applied.  SNF packet prepared by Education officer, museum and sent with EMS to facility.  Prescriptions are in packet. Report called to RN at [facility]  Patient escorted via stretcher, and D/C home via EMS.  Micki Riley 10/17/2014 12:26 PM

## 2014-10-17 NOTE — Progress Notes (Signed)
Patient ID: [name], male   DOB: 06/09/1946, 69 y.o.   MRN: [id number] 12 Days Post-Op  Subjective: No further bloody BMs.  Patient wanting to leave today.  He feels well  Objective: Vital signs in last 24 hours: Temp:  [97.7 F (36.5 C)-98.3 F (36.8 C)] 98.3 F (36.8 C) (05/09 0516) Pulse Rate:  [71-73] 71 (05/09 0516) Resp:  [17-18] 17 (05/09 0516) BP: (99-129)/(60-69) 99/60 mmHg (05/09 0516) SpO2:  [95 %-98 %] 95 % (05/09 0516) Last BM Date: 10/17/14  Intake/Output from previous day: 05/08 0701 - 05/09 0700 In: 47 [P.O.:120; IV Piggyback:300] Out: 425 [Urine:425] Intake/Output this shift:    PE: Abd: soft, appropriately tender, +BS, ND, incisions healing well, staples out  Lab Results:   Recent Labs  10/16/14 0408 10/17/14 0411  WBC 11.4* 10.6*  HGB 10.7* 10.4*  HCT 32.5* 31.8*  PLT 260 253   BMET  Recent Labs  10/16/14 0408 10/17/14 0411  NA 130* 128*  K 4.4 4.1  CL 98* 96*  CO2 23 22  GLUCOSE 95 108*  BUN 20 22*  CREATININE 0.64 0.63  CALCIUM 8.9 8.7*   PT/INR No results for input(s): LABPROT, INR in the last 72 hours. CMP     Component Value Date/Time   NA 128* 10/17/2014 0411   K 4.1 10/17/2014 0411   CL 96* 10/17/2014 0411   CO2 22 10/17/2014 0411   GLUCOSE 108* 10/17/2014 0411   BUN 22* 10/17/2014 0411   CREATININE 0.63 10/17/2014 0411   CALCIUM 8.7* 10/17/2014 0411   PROT 5.2* 10/12/2014 0334   ALBUMIN 1.8* 10/12/2014 0334   AST 34 10/12/2014 0334   ALT 36 10/12/2014 0334   ALKPHOS 70 10/12/2014 0334   BILITOT 0.5 10/12/2014 0334   GFRNONAA >60 10/17/2014 0411   GFRAA >60 10/17/2014 0411   Lipase     Component Value Date/Time   LIPASE 40 09/30/2014 0253       Studies/Results: No results found.  Anti-infectives: Anti-infectives    Start     Dose/Rate Route Frequency Ordered Stop   10/15/14 1130  levofloxacin (LEVAQUIN) IVPB 750 mg     750 mg 100 mL/hr over 90 Minutes Intravenous Every 24 hours 10/15/14 1046      10/06/14 1445  piperacillin-tazobactam (ZOSYN) IVPB 3.375 g  Status:  Discontinued     3.375 g 12.5 mL/hr over 240 Minutes Intravenous 3 times per day 10/06/14 1443 10/10/14 1232   10/05/14 1117  cefoTEtan in Dextrose 5% (CEFOTAN) 2-2.08 GM-% IVPB    Comments:  Jones, Tomika   : cabinet override      10/05/14 1117 10/05/14 2329   10/05/14 1045  cefoTEtan (CEFOTAN) 2 g in dextrose 5 % 50 mL IVPB     2 g 100 mL/hr over 30 Minutes Intravenous 30 min pre-op 10/04/14 1548 10/05/14 1140       Assessment/Plan   Cecal and rectal mass POD#12 subtotal colectomy with ileorectal anastomosis---Dr. 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 He will need outpatient oncology follow up. I have contacted my office to have them set this up for him as an outpatient, as well as his surgical follow up with Dr. Georgette Dover -no further bleeding with BMs VTE prophylaxis-SCD/heparin FEN- soft diet, will add imodium for diarrhea. (d/w patient again that he has no colon and this will likely be his new norm) PCM-add ensure dispo- to  [facility], per medicine, no further bloody BMs.  Stable for DC  LOS: 18 days    Jameia Makris E 10/17/2014, 9:08 AM Pager: [phone]

## 2014-10-17 NOTE — Clinical Social Work Note (Signed)
Patient to be d/c'ed today to [facility].  Patient and family agreeable to plans will transport via ems RN to call report.  Evette Cristal, MSW, [place]

## 2014-10-17 NOTE — Discharge Summary (Signed)
Physician Discharge Summary  [name] RJJ:[id number] DOB: [birth year] DOA: 09/29/2014  PCP: [name], MD  Admit date: 09/29/2014 Discharge date: 10/17/2014  Time spent: 45 minutes  Recommendations for Outpatient Follow-up:  1. Dr.Tseui ([facility]) in [place], office will call 2. 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, marginal zone-New Diagnosis   Acute respiratory failure with hypoxia   Aspiration pneumonia   Septic shock   GIB (gastrointestinal bleeding)   Protein-calorie malnutrition   Thrombocytopenia   Chronic ischemic heart disease   Atrial fibrillation   BPH (benign prostatic hyperplasia)   HLD (hyperlipidemia)   Depression   Abdominal pain   Hematochezia   Protein-calorie malnutrition, severe   Acute on chronic respiratory failure with hypoxia   Essential hypertension   Atrial fibrillation with RVR   Hyponatremia   Hypokalemia   Rectal bleeding   Retroperitoneal bleed   Acute blood loss anemia   HCAP (healthcare-associated pneumonia)   Delirium   Aspiration pneumonia due to food (regurgitated)   Streptococcal pneumonia   Discharge Condition: stable  Diet recommendation: soft bland diet advance as tolerated  Filed Weights   10/09/14 0500 10/10/14 0600 10/12/14 0300  Weight: 54.3 kg (119 lb 11.4 oz) 55.9 kg (123 lb 3.8 oz) 51.03 kg (112 lb 8 oz)    History of present illness:  Chief complaint: Dark stools [name]:[id number] R [name] is a 69 y.o. male with a Past Medical History of atrial fibrillation on chronic anticoagulation with Xarelto, history of complete heart block status post pacemaker implantation, history of CAD, nonambulatory-mostly bed to wheelchair bound who presented from his ALF for evaluation of black tarry stools mixed with fresh blood    Hospital Course:   Cecal and rectal mass S/p subtotal colectomy with ileorectal anastomosis  -this was noted on colonoscopy, path: tubular adenoma with high grade dysplasia -S/p subtotal colectomy with ileorectal anastomosis 4/27 -[facility] removed staples  -was started on TNA post op, now weaned off TNA and diet advanced -Close FU with [facility], I arranged Onc FU -xarelto for Afib resumed  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, marginal zone-New Diagnosis from Path above -Onc FU arranged with Dr.Feng on May 17 at 2:30pm  Acute hypoxic respiratory failure -Post OP -Multifactorial to include COPD exacerbation, meds,+/- HCAP -Improved with nebs, Abx, pulm toilet -weaned off O2 -Continue Atrovent QID, Pulmicort nebulizer BID - CXR 5/5 with some worsened infiltrate given rise in WBC started on levaquin, trach aspirate positive for strep pneumoniae, will complete levaquin course at ALF -encouraged IS  HCAP vs Aspiration PNA LLL  -levaquin as above to complete course, stable, improved  HTN  -Continue Coreg 6.25 mg BID -stable  AFib with RVR (chronic) CHADS-VASc =2c -Currently in NSR  -continue Xarelto, d/w [facility]  Hyopnatremia/ hypokalemia -Continues to improve   Retroperitoneal bleed - small.  -Stable, monitor closely  Protein calorie malnutrition -weaned off TNA, advanced diet, supplements added   Procedures: Damon Graff, MD 4/27 Subtotal colectomy with ileorectal anastomosis; #1 rectal mass (tubulovillous adenoma with high-grade dysplasia)#2 cecal mass (adenoma with high-grade dysplasia) #3. 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, marginal zone  4/28> respiratory failure, tx ICU, intubation   Consultations:  GI  [facility]  Discharge Exam: Filed Vitals:   10/17/14  0516  BP: 99/60  Pulse: 71  Temp: 98.3 F (36.8 C)  Resp: 17    General: AAOx2 Cardiovascular: S1S2/RRR Respiratory: CTAB  Discharge  Instructions   Discharge Instructions    Diet - low sodium heart healthy    Complete by:  As directed      Increase activity slowly    Complete by:  As directed           Current Discharge Medication List    START taking these medications   Details  loperamide (IMODIUM) 2 MG capsule Take 1 capsule (2 mg total) by mouth as needed for diarrhea or loose stools (you may give 2mg  of imodium after each loose stool, up to 16 mg total). Qty: 30 capsule, Refills: 0    oxyCODONE-acetaminophen (PERCOCET/ROXICET) 5-325 MG per tablet Take 1 tablet by mouth every 4 (four) hours as needed for moderate pain. Qty: 30 tablet, Refills: 0      CONTINUE these medications which have NOT CHANGED   Details  Calcium Carbonate-Vit D-Min (CALTRATE 600+D PLUS MINERALS) 600-800 MG-UNIT TABS Take 1 tablet by mouth daily.    carvedilol (COREG) 12.5 MG tablet Take 12.5 mg by mouth 2 (two) times daily with a meal.    docusate sodium (COLACE) 100 MG capsule Take 1 capsule (100 mg total) by mouth 2 (two) times daily. Continue this while taking narcotics to help with bowel movements Qty: 30 capsule, Refills: 1    finasteride (PROSCAR) 5 MG tablet Take 5 mg by mouth every morning.    magnesium oxide (MAG-OX) 400 MG tablet Take 400 mg by mouth 2 (two) times daily.    pravastatin (PRAVACHOL) 10 MG tablet Take 10 mg by mouth at bedtime.    rivaroxaban (XARELTO) 20 MG TABS tablet Take 1 tablet (20 mg total) by mouth every morning. Resume 4/19 Qty: 30 tablet    sertraline (ZOLOFT) 50 MG tablet Take 50 mg by mouth every morning.     zolpidem (AMBIEN) 5 MG tablet Take 5 mg by mouth at bedtime.    traMADol (ULTRAM) 50 MG tablet Take 1 tablet (50 mg total) by mouth every 6 (six) hours as needed. Qty: 20 tablet, Refills: 0      STOP taking these medications     doxazosin (CARDURA) 4 MG tablet      lisinopril (PRINIVIL,ZESTRIL) 5 MG tablet        No Known Allergies Follow-up Information    Follow up with  Maia Petties., MD.   Specialty:  General Surgery   Why:  our office will call you   Contact information:   [address] [place] [address] [phone]       Follow up with [facility].   Specialty:  [place]   Why:  Will provide [facility], PT, OT, aide   Contact information:   [place] [address] [phone]        The results of significant diagnostics from this hospitalization (including imaging, microbiology, ancillary and laboratory) are listed below for reference.    Significant Diagnostic Studies: Dg Chest 2 View  09/21/2014   CLINICAL DATA:  Left-sided chest pain for 4 days.  EXAM: CHEST  2 VIEW  COMPARISON:  05/27/2013  FINDINGS: Unchanged positioning of dual-chamber pacer leads from the right. Negative cardiomediastinal silhouette. Streaky right infrahilar opacity. There is no edema, consolidation, effusion, or pneumothorax.  IMPRESSION: 1. No acute findings. 2. Mild scarring or atelectasis in the right infrahilar  lung.   Electronically Signed   By: Monte Fantasia M.D.   On: 09/21/2014 22:29   Ct Abdomen Pelvis W Contrast  09/30/2014   CLINICAL DATA:  Patient presents with rectal bleeding. Recent hospitalization for rectal bleeding. EGD 09/26/2014 negative. Abdominal pain and suprapubic pain. Mild nausea.  EXAM: CT ABDOMEN AND PELVIS WITH CONTRAST  TECHNIQUE: Multidetector CT imaging of the abdomen and pelvis was performed using the standard protocol following bolus administration of intravenous contrast.  CONTRAST:  163mL OMNIPAQUE IOHEXOL 300 MG/ML  SOLN  COMPARISON:  10/26/2006  FINDINGS: Lung bases are within normal.  Cardiac pacer leads are present.  Abdominal images demonstrate evidence of previous cholecystectomy. There is dilatation of the common bile duct measuring 1.7 cm with mild prominence of the central intrahepatic ducts likely due to the postcholecystectomy state. No focal mass or stone seen within the common  bile duct. 4 mm hypodensity over the body of the pancreas unchanged. The spleen, liver and adrenal glands are within normal. Kidneys are normal size without hydronephrosis or nephrolithiasis. There is a 1.5 cm cyst over the upper pole of the left kidney and 1.2 cm cyst over the upper pole of the right kidney. Ureters are within normal. Appendix is not visualized. There is moderate calcified plaque over the abdominal aorta and iliac arteries. 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 There is minimal diverticulosis of the colon.  There is mild reversal of the a normal relationship of the SMA and SMV as the SMV is slightly anterior and to the left of the SMA, although this unchanged. Slight increased number of small bowel loops in the right upper quadrant although unchanged. These findings can be seen with an internal hernia. No evidence of bowel obstruction. There is a heterogeneous extraluminal collection immediately posterior to several small bowel loops in the right mid abdomen and located just inferior to the right kidney measuring 1.8 x 4.6 cm. This may represent a hemorrhagic collection. There is no evidence of bowel perforation/free air.  Pelvic images demonstrate a normal bladder, prostate and rectum. There is hardware over the right femur intact. Mild degenerate change of the spine and left hip.  IMPRESSION: Heterogeneous collection measuring 1.8 x 4.6 cm over the posterior right mid abdomen immediately posterior to several small bowel loops and inferior to the right kidney. This likely represents a small hemorrhagic collection with etiology uncertain. No evidence of free peritoneal air or bowel obstruction.  Stable reversal of the SMA to SMV relationship with slight increased number of small bowel loops in the right upper quadrant as may be due to internal hernia, however is unchanged from the prior exam.  Post cholecystectomy prominence of the common  bowel duct. Stable 4 mm hypodensity over the body of the pancreas unchanged from 2008.  Bilateral renal cysts.  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  These results were called by telephone at the time of interpretation on 09/30/2014 at 1:32 pm to Dr. Tana Coast, who verbally acknowledged these results.   Electronically Signed   By: Marin Olp M.D.   On: 09/30/2014 13:33   Dg Hand 2 View Right  09/25/2014   CLINICAL DATA:  Right hand pain. Slammed door on fingers at home last Sunday, persistent pain.  EXAM: RIGHT HAND - 2 VIEW  COMPARISON:  None.  FINDINGS: Diffuse bony under mineralization. No fracture or dislocation. The alignment is maintained. 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 There are multiple, at least 4, small linear densities projecting in the soft tissues of the hand overlying the metacarpals consistent with foreign bodies. These measure up to 7 mm in length.  IMPRESSION: 1. No fracture or dislocation of the right hand. 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 2. Multiple, at least 4, small linear densities in the soft tissues of the hand consistent with foreign bodies. Acuity is uncertain.   Electronically Signed   By: Jeb Levering M.D.   On: 09/25/2014 01:36   Dg Chest Port 1 View  10/13/2014   CLINICAL DATA:  Rectal mass.  EXAM: PORTABLE CHEST - 1 VIEW  COMPARISON:  10/09/2014  FINDINGS: The nasogastric tube has been removed. There are intact appearances of the transvenous leads. The left jugular central line extends into the SVC. Basilar airspace opacities are present bilaterally, and there probably also is a left pleural effusion. There is worsened consolidation in the medial bases.  IMPRESSION: Worsened basilar consolidation and probable small pleural effusion.   Electronically Signed   By: Andreas Newport M.D.   On: 10/13/2014 06:00   Dg Chest Port 1 View  10/09/2014    CLINICAL DATA:  Pneumonia.  EXAM: PORTABLE CHEST - 1 VIEW  COMPARISON:  10/08/2014  FINDINGS: Left IJ central line tip is unchanged, tip overlying the level of the superior vena cava. Right-sided pacer leads overlie the right atrium and right ventricle. Nasogastric tube tip is off the film but beyond the gastroesophageal junction. There is increased right-sided pleural effusion. Dense opacity at the left lung base obscures the hemidiaphragm. There is a left pleural effusion.  IMPRESSION: 1. Bilateral pleural effusions, increased on the right. 2. Persistent dense left lower lobe infiltrate.   Electronically Signed   By: Nolon Nations M.D.   On: 10/09/2014 09:20   Dg Chest Port 1 View  10/08/2014   CLINICAL DATA:  Nasogastric tube placement. Atrial fibrillation. History of smoking.  EXAM: PORTABLE CHEST - 1 VIEW  COMPARISON:  10/08/2014 and earlier  FINDINGS: Left IJ central line has been placed, tip overlying the superior vena cava. Nasogastric tube is in place, tip not well seen but beyond the gastroesophageal junction.  Right-sided transvenous pacemaker leads overlie the right atrium and right ventricle. The heart is enlarged. There is dense opacity at the left lung base consistent with infiltrate.  There is lucency below the base the heart, suspicious for pneumopericardium or pneumoperitoneum. Further evaluation with left lateral decubitus view of the abdomen is recommended.  IMPRESSION: 1. Interval placement of left IJ central line.  No pneumothorax. 2. Nasogastric tube placed, tip beyond the gastroesophageal junction. 3. Question of abnormal gas collection in the upper abdomen. See above. Recommend left lateral decubitus view of the abdomen to exclude free intraperitoneal air. 4. The salient findings were discussed with Dr. Emmit Alexanders on 10/08/2014 at 6:58 pm.   Electronically Signed   By: Nolon Nations M.D.   On: 10/08/2014 19:00   Dg Chest Port 1 View  10/08/2014   CLINICAL DATA:  Hypoxia  EXAM:  PORTABLE CHEST - 1 VIEW  COMPARISON:  October 07, 2014  FINDINGS: Endotracheal tube tip is 3.0 cm above the carina. Central catheter tip is in the superior vena cava. Nasogastric tube tip and side port are in the stomach. No pneumothorax. Pacemaker leads are attached to the right atrium right ventricle. Heart size and pulmonary vascularity are normal.  There is persistent  patchy infiltrate in the left lower lobe. Lungs are otherwise clear. No adenopathy.  IMPRESSION: Tube and catheter positions as described without pneumothorax. Persistent left lower lobe infiltrate without appreciable change. No new opacity. No change in cardiac silhouette.   Electronically Signed   By: Lowella Grip III M.D.   On: 10/08/2014 07:23   Dg Chest Port 1 View  10/07/2014   CLINICAL DATA:  Respiratory failure .  EXAM: PORTABLE CHEST - 1 VIEW  COMPARISON:  10/06/2014.  FINDINGS: Endotracheal tube, left IJ line, NG tube ends stable position. Mediastinum and hilar structures are normal. Heart size stable. Cardiac pacer in stable position. Developing diffuse left lower lobe infiltrate. Pneumonia could present in this fashion. Aspiration cannot be excluded. No pleural effusion or pneumothorax.  IMPRESSION: 1. Lines and tubes in stable position. 2. Developing prominent left lower lobe infiltrate consistent with pneumonia. Aspiration cannot be excluded.   Electronically Signed   By: Marcello Moores  Register   On: 10/07/2014 07:38   Dg Chest Port 1 View  10/06/2014   CLINICAL DATA:  Encounter for central line placement.  EXAM: PORTABLE CHEST - 1 VIEW  COMPARISON:  Same day.  FINDINGS: The heart size and mediastinal contours are within normal limits. Both lungs are clear. No pneumothorax or pleural effusion is noted. Endotracheal tube is in grossly good position with distal tip 6.4 cm above the carina. Left internal jugular catheter is noted with distal tip in expected position of SVC The visualized skeletal structures are unremarkable.   IMPRESSION: No pneumothorax status post left internal jugular catheter placement. Endotracheal tube in grossly good position.   Electronically Signed   By: Marijo Conception, M.D.   On: 10/06/2014 15:51   Dg Chest Port 1 View  10/06/2014   CLINICAL DATA:  Shortness of breath.  Decreased oxygen saturation.  EXAM: PORTABLE CHEST - 1 VIEW  COMPARISON:  Single view of the chest 05/27/2013. PA and lateral chest 09/21/2014.  FINDINGS: Pacing device in place. Heart size is upper normal. There is no pulmonary edema. Lungs are clear. No pneumothorax or pleural effusion.  IMPRESSION: No acute disease.   Electronically Signed   By: Inge Rise M.D.   On: 10/06/2014 14:15   Dg Abd Decub  10/08/2014   CLINICAL DATA:  Evaluate for free intraperitoneal air. Chest radiograph demonstrating possible free intraperitoneal air.  EXAM: ABDOMEN - 1 VIEW DECUBITUS  COMPARISON:  CT of 09/30/2014  FINDINGS: Right-sided decubitus view demonstrates lucency in the right upper quadrant for which trace free intraperitoneal air is suspected. Midline laparotomy. Normal small bowel caliber. Right proximal femoral fixation.  IMPRESSION: 1. Suspicion of small volume right upper quadrant free intraperitoneal air. Critical test results telephoned to Kathlee Nations, r.n. at the time of interpretation at 10:39 p.m.on 10/08/2014. Consider dedicated abdominal series including left side up decubitus imaging. 2. No bowel distension.   Electronically Signed   By: Abigail Miyamoto M.D.   On: 10/08/2014 22:41    Microbiology: Recent Results (from the past 240 hour(s))  Clostridium Difficile by PCR     Status: None   Collection Time: 10/15/14  7:17 AM  Result Value Ref Range Status   C difficile by pcr NEGATIVE NEGATIVE Final     Labs: Basic Metabolic Panel:  Recent Labs Lab 10/11/14 0400 10/12/14 0334 10/13/14 0420 10/14/14 0505 10/16/14 0408 10/17/14 0411  NA 134* 129* 131* 131* 130* 128*  K 3.7 4.3 4.5 4.7 4.4 4.1  CL 101 99* 100* 100* 98*  96*  CO2 23 23 24 25 23 22   GLUCOSE 132* 148* 168* 139* 95 108*  BUN 20 20 22* 25* 20 22*  CREATININE 0.51* 0.52* 0.50* 0.52* 0.64 0.63  CALCIUM 7.9* 7.8* 8.1* 8.5* 8.9 8.7*  MG 1.9 1.9 1.9 2.1  --   --   PHOS  --  4.1 4.5  --   --   --    Liver Function Tests:  Recent Labs Lab 10/12/14 0334  AST 34  ALT 36  ALKPHOS 70  BILITOT 0.5  PROT 5.2*  ALBUMIN 1.8*   No results for input(s): LIPASE, AMYLASE in the last 168 hours. No results for input(s): AMMONIA in the last 168 hours. CBC:  Recent Labs Lab 10/12/14 0334 10/13/14 0420 10/15/14 0416 10/16/14 0408 10/17/14 0411  WBC 11.6* 10.2 14.5* 11.4* 10.6*  NEUTROABS 8.2*  --   --   --   --   HGB 10.7* 10.8* 11.3* 10.7* 10.4*  HCT 32.3* 32.7* 34.8* 32.5* 31.8*  MCV 92.6 92.9 93.5 93.1 93.0  PLT 167 175 280 260 253   Cardiac Enzymes: No results for input(s): CKTOTAL, CKMB, CKMBINDEX, TROPONINI in the last 168 hours. BNP: BNP (last 3 results) No results for input(s): BNP in the last 8760 hours.  ProBNP (last 3 results) No results for input(s): PROBNP in the last 8760 hours.  CBG:  Recent Labs Lab 10/16/14 1546 10/16/14 2003 10/17/14 0008 10/17/14 0400 10/17/14 0716  GLUCAP 96 108* 116* 105* 99       Signed:  Cale Bethard  [facility] 10/17/2014, 10:56 AM

## 2014-10-18 ENCOUNTER — Telehealth: Payer: Self-pay | Admitting: *Deleted

## 2014-10-18 NOTE — Telephone Encounter (Signed)
Spoke with representative at [facility]: They are unable to transport him to the 10/25/14 appointment and request a reschedule. Rescheduled for Dr. Burr Medico on 10/28/14 at 11:00. This appointment was accepted.  Faxed appointment calendar and MD contact information to center. Requesting he bring his insurance information and current med list with him. Inquired if he needs a relative or POA to accompany him to the visit. If so, please contact them.

## 2014-10-25 ENCOUNTER — Ambulatory Visit: Payer: Medicare Other

## 2014-10-25 ENCOUNTER — Ambulatory Visit: Payer: Medicare Other | Admitting: Hematology

## 2014-10-28 ENCOUNTER — Ambulatory Visit: Payer: Medicare Other

## 2014-10-28 ENCOUNTER — Encounter: Payer: Self-pay | Admitting: Hematology

## 2014-10-28 ENCOUNTER — Ambulatory Visit (HOSPITAL_BASED_OUTPATIENT_CLINIC_OR_DEPARTMENT_OTHER): Payer: Medicare Other | Admitting: Hematology

## 2014-10-28 ENCOUNTER — Ambulatory Visit (HOSPITAL_BASED_OUTPATIENT_CLINIC_OR_DEPARTMENT_OTHER): Payer: Medicare Other

## 2014-10-28 ENCOUNTER — Telehealth: Payer: Self-pay | Admitting: Hematology

## 2014-10-28 VITALS — BP 109/61 | HR 56 | Temp 98.2°F | Resp 18 | Ht 69.0 in | Wt 96.1 lb

## 2014-10-28 DIAGNOSIS — I4891 Unspecified atrial fibrillation: Secondary | ICD-10-CM

## 2014-10-28 DIAGNOSIS — D128 Benign neoplasm of rectum: Secondary | ICD-10-CM

## 2014-10-28 DIAGNOSIS — C8580 Other specified types of non-Hodgkin lymphoma, unspecified site: Secondary | ICD-10-CM

## 2014-10-28 LAB — CBC & DIFF AND RETIC
BASO%: 0.1 % (ref 0.0–2.0)
BASOS ABS: 0 10*3/uL (ref 0.0–0.1)
EOS%: 1.9 % (ref 0.0–7.0)
Eosinophils Absolute: 0.1 10*3/uL (ref 0.0–0.5)
HEMATOCRIT: 31.6 % — AB (ref 38.4–49.9)
HEMOGLOBIN: 10.2 g/dL — AB (ref 13.0–17.1)
Immature Retic Fract: 12 % — ABNORMAL HIGH (ref 3.00–10.60)
LYMPH#: 2.9 10*3/uL (ref 0.9–3.3)
LYMPH%: 39.3 % (ref 14.0–49.0)
MCH: 31.1 pg (ref 27.2–33.4)
MCHC: 32.3 g/dL (ref 32.0–36.0)
MCV: 96.3 fL (ref 79.3–98.0)
MONO#: 0.6 10*3/uL (ref 0.1–0.9)
MONO%: 8 % (ref 0.0–14.0)
NEUT#: 3.7 10*3/uL (ref 1.5–6.5)
NEUT%: 50.7 % (ref 39.0–75.0)
Platelets: 209 10*3/uL (ref 140–400)
RBC: 3.28 10*6/uL — ABNORMAL LOW (ref 4.20–5.82)
RDW: 15 % — ABNORMAL HIGH (ref 11.0–14.6)
Retic %: 2.04 % — ABNORMAL HIGH (ref 0.80–1.80)
Retic Ct Abs: 66.91 10*3/uL (ref 34.80–93.90)
WBC: 7.3 10*3/uL (ref 4.0–10.3)

## 2014-10-28 LAB — COMPREHENSIVE METABOLIC PANEL (CC13)
ALK PHOS: 97 U/L (ref 40–150)
ALT: 11 U/L (ref 0–55)
AST: 14 U/L (ref 5–34)
Albumin: 3.3 g/dL — ABNORMAL LOW (ref 3.5–5.0)
Anion Gap: 13 mEq/L — ABNORMAL HIGH (ref 3–11)
BUN: 17.4 mg/dL (ref 7.0–26.0)
CO2: 24 mEq/L (ref 22–29)
CREATININE: 0.7 mg/dL (ref 0.7–1.3)
Calcium: 8.8 mg/dL (ref 8.4–10.4)
Chloride: 103 mEq/L (ref 98–109)
EGFR: >90 mL/min/{1.73_m2} (ref >90)
Glucose: 91 mg/dl (ref 70–140)
Potassium: 3.7 mEq/L (ref 3.5–5.1)
SODIUM: 140 meq/L (ref 136–145)
TOTAL PROTEIN: 6.9 g/dL (ref 6.4–8.3)
Total Bilirubin: 0.27 mg/dL (ref 0.20–1.20)

## 2014-10-28 LAB — LACTATE DEHYDROGENASE (CC13): LDH: 181 U/L (ref 125–245)

## 2014-10-28 NOTE — Telephone Encounter (Signed)
Gave and printed appt sched and avs for pt for JUNE °

## 2014-10-28 NOTE — Progress Notes (Signed)
Checked in new pt with no financial concerns. °

## 2014-10-28 NOTE — Progress Notes (Signed)
[place]  [phone]) [phone] Fax:(336) [phone]  Clinic New Consult Note   Patient Care Team: [name], MD as PCP - General (Family Medicine) 10/28/2014  CHIEF COMPLAINTS/PURPOSE OF CONSULTATION:  Newly diagnosed marginal zone lymphoma  HISTORY OF PRESENTING ILLNESS:  [name] 69 y.o. male is here because of recently diagnosed marginal zone lymphoma. He is accompanied by a staph from his nursing home.  He has been a long-term resident at [facility] ore over 10 years due to his homeless. He has no relatives and make his medical decisions on his own. He has multiple medical conditions, including H fibrillation and heart block status post pacemaker in 2014, gout, dyslipidemia, depression, and BPH. He had a mechanical fall in the nursing home which resulted left hip fracture and surgery. He has a chronic pain since then, and is wheelchair-bound most of time.  He presents to [facility] on 09/29/2014 for GI bleeding. He underwent EGD and colonoscopy, which showed mild gastritis at antrum, a 4 cm mass in the cecum and a 5 cm mass in the rectum, 2 additional small polyps in the transverse colon and the sigmoid colon were removed during the colonoscopy. Serum H pylori test was negative. He was seen by general surgeon Dr. Gershon Crane, and underwent subtotal colectomy with ileorectal anastomosis. The cecum and the rectal mass path revealed tubular adenoma with high-grade dysplasia marginal zone B-cell lymphoma.. Incidentally, the omental biopsy showed marginal zone B-cell lymphoma. He was discharged back to nursing home on 10/17/2014.   He has been recovering well from the surgery. He has loose bowel movement 1-3 times a day, no significant abdominal pain or nausea. His appetite and energy level has recovered well. He denies any other symptoms.  No fever, chill, night sweats, no significant weight loss before his surgery.    MEDICAL HISTORY:  Past Medical  History  Diagnosis Date  . Depression   . Hypomagnesemia   . Dyspepsia   . Fall at nursing home 05/27/2013    "slipped on water; said I broke my right hip" (05/27/2013)  . Migraine     "used to have them bad; haven't had one in awhile" (05/27/2013)  . Gout attack     "not too long ago; in my right foot" (05/27/2013)  . Complete heart block     reason for pacemaker/notes 05/27/2013  . Atrial fibrillation     [name] 05/27/2013  . HLD (hyperlipidemia)   . GIB (gastrointestinal bleeding)   . BPH (benign prostatic hyperplasia)     SURGICAL HISTORY:     Past Surgical History  Procedure Laterality Date  . Insert / replace / remove pacemaker    . Appendectomy    . Cholecystectomy    . Open reduction of hip Right 05/28/2013    Procedure: OPEN REDUCTION OF HIP;  Surgeon: Renette Butters, MD;  Location: [place];  Service: Orthopedics;  Laterality: Right;  . Esophagogastroduodenoscopy (egd) with propofol N/A 09/26/2014    Procedure: ESOPHAGOGASTRODUODENOSCOPY (EGD) WITH PROPOFOL;  Surgeon: Jerene Bears, MD;  Location: [facility] ENDOSCOPY;  Service: Endoscopy;  Laterality: N/A;  . Colonoscopy N/A 10/01/2014    Procedure: COLONOSCOPY;  Surgeon: Jerene Bears, MD;  Location: [facility] ENDOSCOPY;  Service: Endoscopy;  Laterality: N/A;  . Partial colectomy N/A 10/05/2014    Procedure: SUBTOTAL COLECTOMY WITH ILEORECTAL ANASTOMOSIS;  Surgeon: Donnie Mesa, MD;  Location: [place];  Service: General;  Laterality: N/A;    SOCIAL HISTORY: History  Social History  . Marital Status: Divorced    Spouse Name: N/A  . Number of Children: N/A  . Years of Education: N/A   Occupational History  . Not on file.   Social History Main Topics  . Smoking status: Former Smoker -- 0.25 packs/day for 61 years    Types: Cigarettes  . Smokeless tobacco: Former Systems developer    Types: Snuff, Chew     Comment: 05/27/2013 "haven't used chew or snuff in ~ 30 yr"  . Alcohol Use: Yes     Comment: 05/27/2013 "none in years; never had  problem w/it"  . Drug Use: Not on file  . Sexual Activity: No   Other Topics Concern  . Not on file   Social History Narrative    FAMILY HISTORY: Family History  Problem Relation Age of Onset  . Colon cancer Mother     ALLERGIES:  has No Known Allergies.  MEDICATIONS:  Current Outpatient Prescriptions  Medication Sig Dispense Refill  . Calcium Carbonate-Vit D-Min (CALTRATE 600+D PLUS MINERALS) 600-800 MG-UNIT TABS Take 1 tablet by mouth daily.    . carvedilol (COREG) 12.5 MG tablet Take 12.5 mg by mouth 2 (two) times daily with a meal.    . docusate sodium (COLACE) 100 MG capsule Take 1 capsule (100 mg total) by mouth 2 (two) times daily. Continue this while taking narcotics to help with bowel movements 30 capsule 1  . finasteride (PROSCAR) 5 MG tablet Take 5 mg by mouth every morning.    . magnesium oxide (MAG-OX) 400 MG tablet Take 400 mg by mouth 2 (two) times daily.    Marland Kitchen oxyCODONE-acetaminophen (PERCOCET/ROXICET) 5-325 MG per tablet Take 1 tablet by mouth every 4 (four) hours as needed for moderate pain. 30 tablet 0  . pravastatin (PRAVACHOL) 10 MG tablet Take 10 mg by mouth at bedtime.    . rivaroxaban (XARELTO) 20 MG TABS tablet Take 1 tablet (20 mg total) by mouth every morning. Resume 4/19 30 tablet   . sertraline (ZOLOFT) 50 MG tablet Take 50 mg by mouth every morning.     . traMADol (ULTRAM) 50 MG tablet Take 1 tablet (50 mg total) by mouth every 6 (six) hours as needed. 20 tablet 0  . zolpidem (AMBIEN) 5 MG tablet Take 5 mg by mouth at bedtime.    Marland Kitchen loperamide (IMODIUM) 2 MG capsule Take 1 capsule (2 mg total) by mouth as needed for diarrhea or loose stools (you may give 63m of imodium after each loose stool, up to 16 mg total). (Patient not taking: Reported on 10/28/2014) 30 capsule 0   No current facility-administered medications for this visit.    REVIEW OF SYSTEMS:   Constitutional: Denies fevers, chills or abnormal night sweats Eyes: Denies blurriness of vision,  double vision or watery eyes Ears, nose, mouth, throat, and face: Denies mucositis or sore throat Respiratory: Denies cough, dyspnea or wheezes Cardiovascular: Denies palpitation, chest discomfort or lower extremity swelling Gastrointestinal:  Denies nausea, heartburn or change in bowel habits Skin: Denies abnormal skin rashes Lymphatics: Denies new lymphadenopathy or easy bruising Neurological:Denies numbness, tingling or new weaknesses Behavioral/Psych: Mood is stable, no new changes  All other systems were reviewed with the patient and are negative.  PHYSICAL EXAMINATION: ECOG PERFORMANCE STATUS: 3 - Symptomatic, >50% confined to bed  Filed Vitals:   10/28/14 1138  BP: 109/61  Pulse: 56  Temp: 98.2 F (36.8 C)  Resp: 18   Filed Weights   10/28/14 1138  Weight: 96  lb 1.6 oz (43.591 kg)    GENERAL:alert, no distress and comfortable SKIN: skin color, texture, turgor are normal, no rashes or significant lesions EYES: normal, conjunctiva are pink and non-injected, sclera clear OROPHARYNX:no exudate, no erythema and lips, buccal mucosa, and tongue normal  NECK: supple, thyroid normal size, non-tender, without nodularity LYMPH:  no palpable lymphadenopathy in the cervical, axillary or inguinal LUNGS: clear to auscultation and percussion with normal breathing effort HEART: regular rate & rhythm and no murmurs and no lower extremity edema ABDOMEN:abdomen soft, non-tender and normal bowel sounds Musculoskeletal:no cyanosis of digits and no clubbing  PSYCH: alert & oriented x 3 with fluent speech NEURO: no focal motor/sensory deficits  LABORATORY DATA:  I have reviewed the data as listed Lab Results  Component Value Date   WBC 10.6* 10/17/2014   HGB 10.4* 10/17/2014   HCT 31.8* 10/17/2014   MCV 93.0 10/17/2014   PLT 253 10/17/2014    Recent Labs  10/09/14 0400 10/10/14 0400  10/12/14 0334  10/14/14 0505 10/16/14 0408 10/17/14 0411  NA 137 136  < > 129*  < > 131*  130* 128*  K 3.2* 3.7  < > 4.3  < > 4.7 4.4 4.1  CL 101 104  < > 99*  < > 100* 98* 96*  CO2 27 23  < > 23  < > '25 23 22  ' GLUCOSE 135* 175*  < > 148*  < > 139* 95 108*  BUN 12 21*  < > 20  < > 25* 20 22*  CREATININE 0.65 0.62  < > 0.52*  < > 0.52* 0.64 0.63  CALCIUM 7.7* 7.9*  < > 7.8*  < > 8.5* 8.9 8.7*  GFRNONAA >60 >60  < > >60  < > >60 >60 >60  GFRAA >60 >60  < > >60  < > >60 >60 >60  PROT 4.8* 4.8*  --  5.2*  --   --   --   --   ALBUMIN 1.6* 1.6*  --  1.8*  --   --   --   --   AST 14* 15  --  34  --   --   --   --   ALT 15* 14*  --  36  --   --   --   --   ALKPHOS 55 54  --  70  --   --   --   --   BILITOT 0.4 0.6  --  0.5  --   --   --   --   < > = values in this interval not displayed.  Surgical pathology 10/05/2014 Diagnosis 1. 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 SEE COMMENT. - CECAL TUBULAR ADENOMA (1.1CM) NEGATIVE FOR HIGH GRADE DYSPLASIA AND INVASIVE MALIGNANCY - RECTAL PREVIOUS BIOPSY SITE, NEGATIVE FOR DYSPLASIA OR MALIGNANCY. - OMENTAL SOFT TISSUE INVOLVEMENT BY LOW GRADE NON-HODGKIN'S B CELL LYMPHOMA, MARGINAL ZONETYPE - TWENTY-SIX LYMPH NODES, NEGATIVE FOR METASTATIC TUMOR (0/26). - ENDOSCOPIC TATTOO INK IDENTIFIED. - COLORECTAL ULCERATIONS WITH ISCHEMIC INJURY AND KAYEXALATE DEPOSITS. - SURGICAL MARGINS, NEGATIVE FOR EPITHELIAL DYSPLASIA OR MALIGNANCY. 2. Colon, resection margin (donut), distal - BENIGN COLON. - NEGATIVE FOR EPITHELIAL DYSPLASIA OR MALIGNANCY. Microscopic Comment 1. The entire 5.1 cm cecal lesion was submitted for review. Slide sections demonstrate tubular adenoma with multiple foci of high grade adenomatous dysplasia present. Although there is focal, minimal mucin extravasation extending into the  submucosa, within the mucin, there is no epithelial lining or freely floating epithelium. The finding is consistent with benign mucin  extravasation secondary to prolapsed mucosa. There is a separate cecal polyp (1.1 cm) with features of tubular adenoma negative for high grade dysplasia or invasive malignancy. A third proximal mucosa polyp (slide D) demonstrates benign polypoid mucosa negative for features of dysplasia or malignancy. There are multiple erosions/ulcerations with associated ischemic injury throughout the colorectum that focally demonstrate basophilic crystalline material consistent with Kayexalate crystalline deposits. The entire stellate area of rectal mucosa representing the previous biopsy site was submitted for review. Slide sections demonstrate benign rectal mucosa with features of previous biopsy site. There are no features of epithelial dysplasia or malignancy present. Within the peri-colonic and omental adipose soft tissue, there are two foci of benign nodular fat necrosis present. Slides from the additional omental nodules as well as the remaining lymph nodes were evaluated by Dr. Gari Crown. Sections of the omental nodules (block Z and ZA) show fatty tissue infiltrated by a dense, relatively monomorphic infiltrate of primarily small lymphoid cells displaying round to slightly irregular nuclei, high nuclear cytoplasmic ratio, dense to partially clumped chromatin, and small nucleoli. The appearance is diffuse associated numerous very small and relatively effaced disrupted germinal centers. Definite atypical follicular lymphoid follicles or proliferation centers are not seen. To further evaluate this process, immunohistochemical stains for BCL-2, BCL-6, CD3, CD5, CD10, CD20, CD21, CD43, CD79a, and Cyclin D-1 were performed on block 1ZA with appropriate controls. The overwhelming majority of lymphocytes consists of B cells as seen with CD20 and CD79a. This is associated with patchy weak co-expression with CD43. No significant CD5 or CD10 positivity is identified. The latter shows minor positivity in some cells in  areas of previously described germinal centers. The latter also are highlighted by numerous dendritic networks as seen with CD21. No significant Cyclin D-1 or BCL-6 positivity is identified. BCL-2 is strongly positive throughout the tissue. The overall features are consistent with involvement by low grade non-Hodgkin's B cell lymphoma, which is best classified as marginal zone type. Sections of all other lymph nodes show relative preservation of the architecture, although patchy expansion of a paracortex is seen in some. While the features are not considered diagnostic of lymphoma, partial involvement by the same low grade lymphoma cannot be entirely excluded. (CRR:gt/ds, 10/10/14) [place] RUND DO   RADIOGRAPHIC STUDIES: I have personally reviewed the radiological images as listed and agreed with the findings in the report. Ct Abdomen Pelvis W Contrast  09/30/2014   CLINICAL DATA:  Patient presents with rectal bleeding. Recent hospitalization for rectal bleeding. EGD 09/26/2014 negative. Abdominal pain and suprapubic pain. Mild nausea.  EXAM: CT ABDOMEN AND PELVIS WITH CONTRAST  TECHNIQUE: Multidetector CT imaging of the abdomen and pelvis was performed using the standard protocol following bolus administration of intravenous contrast.  CONTRAST:  114m OMNIPAQUE IOHEXOL 300 MG/ML  SOLN  COMPARISON:  10/26/2006  FINDINGS: Lung bases are within normal.  Cardiac pacer leads are present.  Abdominal images demonstrate evidence of previous cholecystectomy. There is dilatation of the common bile duct measuring 1.7 cm with mild prominence of the central intrahepatic ducts likely due to the postcholecystectomy state. No focal mass or stone seen within the common bile duct. 4 mm hypodensity over the body of the pancreas unchanged. The spleen, liver and adrenal glands are within normal. Kidneys are normal size without hydronephrosis or nephrolithiasis. There is a 1.5 cm cyst over the upper pole of the left kidney and  1.2 cm cyst over the upper pole of the right kidney. Ureters are within normal. Appendix is not visualized. There is moderate calcified plaque over the abdominal aorta and iliac arteries. 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 There is minimal diverticulosis of the colon.  There is mild reversal of the a normal relationship of the SMA and SMV as the SMV is slightly anterior and to the left of the SMA, although this unchanged. Slight increased number of small bowel loops in the right upper quadrant although unchanged. These findings can be seen with an internal hernia. No evidence of bowel obstruction. There is a heterogeneous extraluminal collection immediately posterior to several small bowel loops in the right mid abdomen and located just inferior to the right kidney measuring 1.8 x 4.6 cm. This may represent a hemorrhagic collection. There is no evidence of bowel perforation/free air.  Pelvic images demonstrate a normal bladder, prostate and rectum. There is hardware over the right femur intact. Mild degenerate change of the spine and left hip.  IMPRESSION: Heterogeneous collection measuring 1.8 x 4.6 cm over the posterior right mid abdomen immediately posterior to several small bowel loops and inferior to the right kidney. This likely represents a small hemorrhagic collection with etiology uncertain. No evidence of free peritoneal air or bowel obstruction.  Stable reversal of the SMA to SMV relationship with slight increased number of small bowel loops in the right upper quadrant as may be due to internal hernia, however is unchanged from the prior exam.  Post cholecystectomy prominence of the common bowel duct. Stable 4 mm hypodensity over the body of the pancreas unchanged from 2008.  Bilateral renal cysts.  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  These results were called by telephone at the  time of interpretation on 09/30/2014 at 1:32 pm to Dr. Tana Coast, who verbally acknowledged these results.   Electronically Signed   By: Marin Olp M.D.   On: 09/30/2014 13:33   Dg Chest Port 1 View  10/13/2014   CLINICAL DATA:  Rectal mass.  EXAM: PORTABLE CHEST - 1 VIEW  COMPARISON:  10/09/2014  FINDINGS: The nasogastric tube has been removed. There are intact appearances of the transvenous leads. The left jugular central line extends into the SVC. Basilar airspace opacities are present bilaterally, and there probably also is a left pleural effusion. There is worsened consolidation in the medial bases.  IMPRESSION: Worsened basilar consolidation and probable small pleural effusion.   Electronically Signed   By: Andreas Newport M.D.   On: 10/13/2014 06:00   Dg Chest Port 1 View  10/09/2014   CLINICAL DATA:  Pneumonia.  EXAM: PORTABLE CHEST - 1 VIEW  COMPARISON:  10/08/2014  FINDINGS: Left IJ central line tip is unchanged, tip overlying the level of the superior vena cava. Right-sided pacer leads overlie the right atrium and right ventricle. Nasogastric tube tip is off the film but beyond the gastroesophageal junction. There is increased right-sided pleural effusion. Dense opacity at the left lung base obscures the hemidiaphragm. There is a left pleural effusion.  IMPRESSION: 1. Bilateral pleural effusions, increased on the right. 2. Persistent dense left lower lobe infiltrate.   Electronically Signed   By: Nolon Nations M.D.   On: 10/09/2014 09:20   Dg Chest Port 1 View  10/08/2014   CLINICAL DATA:  Nasogastric tube placement. Atrial fibrillation. History of smoking.  EXAM: PORTABLE CHEST - 1  VIEW  COMPARISON:  10/08/2014 and earlier  FINDINGS: Left IJ central line has been placed, tip overlying the superior vena cava. Nasogastric tube is in place, tip not well seen but beyond the gastroesophageal junction.  Right-sided transvenous pacemaker leads overlie the right atrium and right ventricle. The heart is  enlarged. There is dense opacity at the left lung base consistent with infiltrate.  There is lucency below the base the heart, suspicious for pneumopericardium or pneumoperitoneum. Further evaluation with left lateral decubitus view of the abdomen is recommended.  IMPRESSION: 1. Interval placement of left IJ central line.  No pneumothorax. 2. Nasogastric tube placed, tip beyond the gastroesophageal junction. 3. Question of abnormal gas collection in the upper abdomen. See above. Recommend left lateral decubitus view of the abdomen to exclude free intraperitoneal air. 4. The salient findings were discussed with Dr. Emmit Alexanders on 10/08/2014 at 6:58 pm.   Electronically Signed   By: Nolon Nations M.D.   On: 10/08/2014 19:00   Dg Chest Port 1 View  10/08/2014   CLINICAL DATA:  Hypoxia  EXAM: PORTABLE CHEST - 1 VIEW  COMPARISON:  October 07, 2014  FINDINGS: Endotracheal tube tip is 3.0 cm above the carina. Central catheter tip is in the superior vena cava. Nasogastric tube tip and side port are in the stomach. No pneumothorax. Pacemaker leads are attached to the right atrium right ventricle. Heart size and pulmonary vascularity are normal.  There is persistent patchy infiltrate in the left lower lobe. Lungs are otherwise clear. No adenopathy.  IMPRESSION: Tube and catheter positions as described without pneumothorax. Persistent left lower lobe infiltrate without appreciable change. No new opacity. No change in cardiac silhouette.   Electronically Signed   By: Lowella Grip III M.D.   On: 10/08/2014 07:23   Dg Chest Port 1 View  10/07/2014   CLINICAL DATA:  Respiratory failure .  EXAM: PORTABLE CHEST - 1 VIEW  COMPARISON:  10/06/2014.  FINDINGS: Endotracheal tube, left IJ line, NG tube ends stable position. Mediastinum and hilar structures are normal. Heart size stable. Cardiac pacer in stable position. Developing diffuse left lower lobe infiltrate. Pneumonia could present in this fashion. Aspiration cannot be  excluded. No pleural effusion or pneumothorax.  IMPRESSION: 1. Lines and tubes in stable position. 2. Developing prominent left lower lobe infiltrate consistent with pneumonia. Aspiration cannot be excluded.   Electronically Signed   By: Marcello Moores  Register   On: 10/07/2014 07:38   Dg Chest Port 1 View  10/06/2014   CLINICAL DATA:  Encounter for central line placement.  EXAM: PORTABLE CHEST - 1 VIEW  COMPARISON:  Same day.  FINDINGS: The heart size and mediastinal contours are within normal limits. Both lungs are clear. No pneumothorax or pleural effusion is noted. Endotracheal tube is in grossly good position with distal tip 6.4 cm above the carina. Left internal jugular catheter is noted with distal tip in expected position of SVC The visualized skeletal structures are unremarkable.  IMPRESSION: No pneumothorax status post left internal jugular catheter placement. Endotracheal tube in grossly good position.   Electronically Signed   By: Marijo Conception, M.D.   On: 10/06/2014 15:51   Dg Chest Port 1 View  10/06/2014   CLINICAL DATA:  Shortness of breath.  Decreased oxygen saturation.  EXAM: PORTABLE CHEST - 1 VIEW  COMPARISON:  Single view of the chest 05/27/2013. PA and lateral chest 09/21/2014.  FINDINGS: Pacing device in place. Heart size is upper normal. There is no pulmonary edema.  Lungs are clear. No pneumothorax or pleural effusion.  IMPRESSION: No acute disease.   Electronically Signed   By: Inge Rise M.D.   On: 10/06/2014 14:15   Dg Abd Decub  10/08/2014   CLINICAL DATA:  Evaluate for free intraperitoneal air. Chest radiograph demonstrating possible free intraperitoneal air.  EXAM: ABDOMEN - 1 VIEW DECUBITUS  COMPARISON:  CT of 09/30/2014  FINDINGS: Right-sided decubitus view demonstrates lucency in the right upper quadrant for which trace free intraperitoneal air is suspected. Midline laparotomy. Normal small bowel caliber. Right proximal femoral fixation.  IMPRESSION: 1. Suspicion of small  volume right upper quadrant free intraperitoneal air. Critical test results telephoned to Kathlee Nations, r.n. at the time of interpretation at 10:39 p.m.on 10/08/2014. Consider dedicated abdominal series including left side up decubitus imaging. 2. No bowel distension.   Electronically Signed   By: Abigail Miyamoto M.D.   On: 10/08/2014 22:41   Colonoscopy 10/01/2014 ENDOSCOPIC IMPRESSION: 1. Mass measuring 4 X 4cm in size was found at the cecum; multiple biopsies of the lesion were performed; a tattoo was applied distally in the ascending colon 2. Sessile polyp ranging from 15 to 63m in size was found in the distal transverse colon; endoscopic mucosal resection was performed  3. Four sessile polyps ranging between 5-932min size were found in the sigmoid colon; polypectomies were performed with a cold snare (1) and using snare cautery (3) 4. Half circumferential mass, measuring 4 X 5cm in size, was found in the rectum; multiple biopsies of the lesion were performed   ASSESSMENT & PLAN:   [id number] old long-term nursing home resident, with multiple medical comorbidities, including atrial fibrillation status post pacemaker and on survival total, hip fracture status post surgery, which resulted in chronic pain and immobilization, depression,  Who presented with GI bleeding with melena and tarry stool.   1. Omental marginal zone B-cell lymphoma  - this is an incidental finding from his recent colon surgery. -His CT chest reviewed no adenopathy, 26 lymph nodes removed from colectomy showed no definitive evidence of lymphoma -He had EGD which showed mild gastritis in the antrum, no other lesions, no random biopsy was done, no breast evidence of gastric involvement by lymphoma. Serum H. pylori test was negative. -I'll obtain a CT chest with contrast to see if he has additional adenopathy, and a bone marrow biopsy to rule out marrow involvement by the lymphoma. I explained this in details to him, he agreed.  -If these  tests are negative, I think he has very small disease burden and would not warrant any treatment. In that case I would recommend observation. -If he has more advanced disease, such as bone marrow involvement, he may benefit from chemotherapy treatment. -Given his medical comorbidities, and the poor performance status due to the hip issue, and very limited understanding of her medical issues and treatment options (he was not able to completely repeat back what I explained to him), he would not be a good candidate for intense chemotherapy treatment.  2. Cecum tubular adenoma and high-grade dysplasia, rectum tubular adenoma, status post subtotal colectomy with ileorectal anastomosis -These lesions has been completely resected by surgery. No need for additional follow-up  3. AF fibrillation, status post pacemaker, arthritis, chronic left hip pain -He'll continue follow-up with his primary care physician at the nursing home.  Plan -CT chest with IV contrast -Bone marrow biopsy by IR -Lab today -Return to clinic in 1 month's to discuss the above test results.   All questions were  answered. The patient knows to call the clinic with any problems, questions or concerns. I spent 55 minutes counseling the patient face to face. The total time spent in the appointment was 60 minutes and more than 50% was on counseling.     Truitt Merle, MD 10/28/2014 12:03 PM

## 2014-10-29 ENCOUNTER — Encounter: Payer: Self-pay | Admitting: Hematology

## 2014-10-29 LAB — HEPATITIS C ANTIBODY: HCV Ab: NEGATIVE

## 2014-10-29 LAB — HEPATITIS B SURFACE ANTIBODY,QUALITATIVE: Hep B S Ab: NEGATIVE

## 2014-10-29 LAB — HEPATITIS B SURFACE ANTIGEN: HEP B S AG: NEGATIVE

## 2014-10-29 LAB — HEPATITIS B CORE ANTIBODY, TOTAL: HEP B C TOTAL AB: NONREACTIVE

## 2014-11-06 ENCOUNTER — Emergency Department (HOSPITAL_COMMUNITY): Payer: Medicare Other

## 2014-11-06 ENCOUNTER — Inpatient Hospital Stay (HOSPITAL_COMMUNITY)
Admission: EM | Admit: 2014-11-06 | Discharge: 2014-11-09 | DRG: 312 | Disposition: A | Discharge status: Home Health | Payer: Medicare Other | Attending: Internal Medicine | Admitting: Internal Medicine

## 2014-11-06 ENCOUNTER — Encounter (HOSPITAL_COMMUNITY): Payer: Self-pay | Admitting: Emergency Medicine

## 2014-11-06 DIAGNOSIS — I951 Orthostatic hypotension: Principal | ICD-10-CM | POA: Diagnosis present

## 2014-11-06 DIAGNOSIS — W19XXXA Unspecified fall, initial encounter: Secondary | ICD-10-CM | POA: Insufficient documentation

## 2014-11-06 DIAGNOSIS — D696 Thrombocytopenia, unspecified: Secondary | ICD-10-CM | POA: Diagnosis present

## 2014-11-06 DIAGNOSIS — E860 Dehydration: Secondary | ICD-10-CM | POA: Diagnosis present

## 2014-11-06 DIAGNOSIS — I4891 Unspecified atrial fibrillation: Secondary | ICD-10-CM | POA: Diagnosis present

## 2014-11-06 DIAGNOSIS — I481 Persistent atrial fibrillation: Secondary | ICD-10-CM | POA: Diagnosis not present

## 2014-11-06 DIAGNOSIS — M25552 Pain in left hip: Secondary | ICD-10-CM | POA: Diagnosis not present

## 2014-11-06 DIAGNOSIS — E46 Unspecified protein-calorie malnutrition: Secondary | ICD-10-CM | POA: Diagnosis present

## 2014-11-06 DIAGNOSIS — K921 Melena: Secondary | ICD-10-CM | POA: Diagnosis present

## 2014-11-06 DIAGNOSIS — Z7982 Long term (current) use of aspirin: Secondary | ICD-10-CM

## 2014-11-06 DIAGNOSIS — N400 Enlarged prostate without lower urinary tract symptoms: Secondary | ICD-10-CM | POA: Diagnosis present

## 2014-11-06 DIAGNOSIS — I482 Chronic atrial fibrillation: Secondary | ICD-10-CM | POA: Diagnosis not present

## 2014-11-06 DIAGNOSIS — Z86010 Personal history of colonic polyps: Secondary | ICD-10-CM | POA: Insufficient documentation

## 2014-11-06 DIAGNOSIS — Z7901 Long term (current) use of anticoagulants: Secondary | ICD-10-CM

## 2014-11-06 DIAGNOSIS — K9289 Other specified diseases of the digestive system: Secondary | ICD-10-CM | POA: Diagnosis present

## 2014-11-06 DIAGNOSIS — D125 Benign neoplasm of sigmoid colon: Secondary | ICD-10-CM | POA: Diagnosis present

## 2014-11-06 DIAGNOSIS — F329 Major depressive disorder, single episode, unspecified: Secondary | ICD-10-CM | POA: Diagnosis present

## 2014-11-06 DIAGNOSIS — R55 Syncope and collapse: Secondary | ICD-10-CM | POA: Diagnosis present

## 2014-11-06 DIAGNOSIS — Z950 Presence of cardiac pacemaker: Secondary | ICD-10-CM

## 2014-11-06 DIAGNOSIS — E785 Hyperlipidemia, unspecified: Secondary | ICD-10-CM | POA: Diagnosis present

## 2014-11-06 DIAGNOSIS — D62 Acute posthemorrhagic anemia: Secondary | ICD-10-CM | POA: Diagnosis present

## 2014-11-06 DIAGNOSIS — I2510 Atherosclerotic heart disease of native coronary artery without angina pectoris: Secondary | ICD-10-CM | POA: Diagnosis present

## 2014-11-06 DIAGNOSIS — Z681 Body mass index (BMI) 19 or less, adult: Secondary | ICD-10-CM

## 2014-11-06 DIAGNOSIS — K6289 Other specified diseases of anus and rectum: Secondary | ICD-10-CM | POA: Insufficient documentation

## 2014-11-06 DIAGNOSIS — R64 Cachexia: Secondary | ICD-10-CM | POA: Diagnosis present

## 2014-11-06 DIAGNOSIS — Z9049 Acquired absence of other specified parts of digestive tract: Secondary | ICD-10-CM | POA: Insufficient documentation

## 2014-11-06 DIAGNOSIS — Z87891 Personal history of nicotine dependence: Secondary | ICD-10-CM

## 2014-11-06 DIAGNOSIS — C8510 Unspecified B-cell lymphoma, unspecified site: Secondary | ICD-10-CM | POA: Diagnosis present

## 2014-11-06 LAB — POC OCCULT BLOOD, ED: Fecal Occult Bld: POSITIVE — AB

## 2014-11-06 LAB — BASIC METABOLIC PANEL
ANION GAP: 9 (ref 5–15)
BUN: 21 mg/dL — AB (ref 6–20)
CALCIUM: 8.6 mg/dL — AB (ref 8.9–10.3)
CO2: 21 mmol/L — AB (ref 22–32)
Chloride: 106 mmol/L (ref 101–111)
Creatinine, Ser: 0.59 mg/dL — ABNORMAL LOW (ref 0.61–1.24)
GFR calc non Af Amer: >60 mL/min (ref >60)
Glucose, Bld: 94 mg/dL (ref 65–99)
Potassium: 3.8 mmol/L (ref 3.5–5.1)
Sodium: 136 mmol/L (ref 135–145)

## 2014-11-06 LAB — CBC WITH DIFFERENTIAL/PLATELET
Basophils Absolute: 0 10*3/uL (ref 0.0–0.1)
Basophils Relative: 0 % (ref 0–1)
EOS PCT: 1 % (ref 0–5)
Eosinophils Absolute: 0 10*3/uL (ref 0.0–0.7)
HEMATOCRIT: 31.5 % — AB (ref 39.0–52.0)
Hemoglobin: 9.9 g/dL — ABNORMAL LOW (ref 13.0–17.0)
Lymphocytes Relative: 45 % (ref 12–46)
Lymphs Abs: 2.7 10*3/uL (ref 0.7–4.0)
MCH: 30.7 pg (ref 26.0–34.0)
MCHC: 31.4 g/dL (ref 30.0–36.0)
MCV: 97.8 fL (ref 78.0–100.0)
MONO ABS: 0.4 10*3/uL (ref 0.1–1.0)
Monocytes Relative: 7 % (ref 3–12)
Neutro Abs: 2.8 10*3/uL (ref 1.7–7.7)
Neutrophils Relative %: 47 % (ref 43–77)
Platelets: 143 10*3/uL — ABNORMAL LOW (ref 150–400)
RBC: 3.22 MIL/uL — ABNORMAL LOW (ref 4.22–5.81)
RDW: 16.8 % — ABNORMAL HIGH (ref 11.5–15.5)
WBC: 5.9 10*3/uL (ref 4.0–10.5)

## 2014-11-06 LAB — PROTIME-INR
INR: 1.54 — AB (ref 0.00–1.49)
Prothrombin Time: 18.5 seconds — ABNORMAL HIGH (ref 11.6–15.2)

## 2014-11-06 MED ORDER — CARVEDILOL 12.5 MG PO TABS
12.5000 mg | ORAL_TABLET | Freq: Two times a day (BID) | ORAL | Status: DC
  Administered 2014-11-06 – 2014-11-09 (×6): 12.5 mg via ORAL
  Filled 2014-11-06 (×6): qty 1

## 2014-11-06 MED ORDER — MAGNESIUM OXIDE 400 MG PO TABS
400.0000 mg | ORAL_TABLET | Freq: Two times a day (BID) | ORAL | Status: DC
  Administered 2014-11-06 – 2014-11-09 (×7): 400 mg via ORAL
  Filled 2014-11-06 (×15): qty 1

## 2014-11-06 MED ORDER — DOCUSATE SODIUM 100 MG PO CAPS
100.0000 mg | ORAL_CAPSULE | Freq: Two times a day (BID) | ORAL | Status: DC
  Administered 2014-11-06 – 2014-11-07 (×3): 100 mg via ORAL
  Filled 2014-11-06 (×4): qty 1

## 2014-11-06 MED ORDER — TRAMADOL HCL 50 MG PO TABS
50.0000 mg | ORAL_TABLET | Freq: Four times a day (QID) | ORAL | Status: DC | PRN
  Administered 2014-11-08: 50 mg via ORAL
  Filled 2014-11-06: qty 1

## 2014-11-06 MED ORDER — ONDANSETRON HCL 4 MG PO TABS: 4.0000 mg | ORAL_TABLET | Freq: Four times a day (QID) | ORAL | Status: DC | PRN

## 2014-11-06 MED ORDER — ZOLPIDEM TARTRATE 5 MG PO TABS
5.0000 mg | ORAL_TABLET | Freq: Every day | ORAL | Status: DC
  Administered 2014-11-06 – 2014-11-08 (×3): 5 mg via ORAL
  Filled 2014-11-06 (×3): qty 1

## 2014-11-06 MED ORDER — ACETAMINOPHEN 650 MG RE SUPP: 650.0000 mg | Freq: Four times a day (QID) | RECTAL | Status: DC | PRN

## 2014-11-06 MED ORDER — SODIUM CHLORIDE 0.9 % IV SOLN: INTRAVENOUS | Status: DC

## 2014-11-06 MED ORDER — BISACODYL 10 MG RE SUPP: 10.0000 mg | Freq: Every day | RECTAL | Status: DC | PRN

## 2014-11-06 MED ORDER — FINASTERIDE 5 MG PO TABS
5.0000 mg | ORAL_TABLET | ORAL | Status: DC
  Administered 2014-11-07 – 2014-11-09 (×3): 5 mg via ORAL
  Filled 2014-11-06 (×4): qty 1

## 2014-11-06 MED ORDER — CALCIUM CARBONATE-VITAMIN D 500-200 MG-UNIT PO TABS
1.0000 | ORAL_TABLET | Freq: Every day | ORAL | Status: DC
  Administered 2014-11-07 – 2014-11-09 (×3): 1 via ORAL
  Filled 2014-11-06 (×4): qty 1

## 2014-11-06 MED ORDER — RIVAROXABAN 20 MG PO TABS
20.0000 mg | ORAL_TABLET | Freq: Every morning | ORAL | Status: DC
  Administered 2014-11-06: 20 mg via ORAL
  Filled 2014-11-06: qty 1

## 2014-11-06 MED ORDER — CALTRATE 600+D PLUS MINERALS 600-800 MG-UNIT PO TABS: 1.0000 | ORAL_TABLET | Freq: Every day | ORAL | Status: DC

## 2014-11-06 MED ORDER — PRAVASTATIN SODIUM 20 MG PO TABS
10.0000 mg | ORAL_TABLET | Freq: Every day | ORAL | Status: DC
  Administered 2014-11-06 – 2014-11-08 (×3): 10 mg via ORAL
  Filled 2014-11-06 (×3): qty 1

## 2014-11-06 MED ORDER — MORPHINE SULFATE 2 MG/ML IJ SOLN
2.0000 mg | Freq: Once | INTRAMUSCULAR | Status: AC
  Administered 2014-11-06: 2 mg via INTRAVENOUS
  Filled 2014-11-06: qty 1

## 2014-11-06 MED ORDER — SODIUM CHLORIDE 0.9 % IV SOLN
INTRAVENOUS | Status: DC
  Administered 2014-11-06 (×2): via INTRAVENOUS

## 2014-11-06 MED ORDER — GUAIFENESIN-DM 100-10 MG/5ML PO SYRP
5.0000 mL | ORAL_SOLUTION | ORAL | Status: DC | PRN
  Administered 2014-11-06 – 2014-11-09 (×7): 5 mL via ORAL
  Filled 2014-11-06 (×8): qty 5

## 2014-11-06 MED ORDER — ACETAMINOPHEN 325 MG PO TABS
650.0000 mg | ORAL_TABLET | Freq: Four times a day (QID) | ORAL | Status: DC | PRN
  Administered 2014-11-08: 650 mg via ORAL
  Filled 2014-11-06: qty 2

## 2014-11-06 MED ORDER — DIPHENHYDRAMINE HCL 25 MG PO CAPS
25.0000 mg | ORAL_CAPSULE | Freq: Once | ORAL | Status: AC
  Administered 2014-11-06: 25 mg via ORAL
  Filled 2014-11-06: qty 1

## 2014-11-06 MED ORDER — POLYETHYLENE GLYCOL 3350 17 G PO PACK
17.0000 g | PACK | Freq: Every day | ORAL | Status: DC | PRN
Start: 2014-11-06 — End: 2014-11-09

## 2014-11-06 MED ORDER — ENSURE PUDDING PO PUDG
1.0000 | Freq: Three times a day (TID) | ORAL | Status: DC
  Administered 2014-11-06 – 2014-11-09 (×5): 1 via ORAL

## 2014-11-06 MED ORDER — OXYCODONE-ACETAMINOPHEN 5-325 MG PO TABS
1.0000 | ORAL_TABLET | ORAL | Status: DC | PRN
  Administered 2014-11-06 – 2014-11-09 (×13): 1 via ORAL
  Filled 2014-11-06 (×14): qty 1

## 2014-11-06 MED ORDER — SERTRALINE HCL 50 MG PO TABS
50.0000 mg | ORAL_TABLET | Freq: Every morning | ORAL | Status: DC
  Administered 2014-11-06 – 2014-11-09 (×4): 50 mg via ORAL
  Filled 2014-11-06 (×4): qty 1

## 2014-11-06 MED ORDER — NICOTINE 21 MG/24HR TD PT24
21.0000 mg | MEDICATED_PATCH | Freq: Every day | TRANSDERMAL | Status: DC
  Administered 2014-11-06 – 2014-11-09 (×4): 21 mg via TRANSDERMAL
  Filled 2014-11-06 (×4): qty 1

## 2014-11-06 MED ORDER — ONDANSETRON HCL 4 MG/2ML IJ SOLN: 4.0000 mg | Freq: Four times a day (QID) | INTRAMUSCULAR | Status: DC | PRN

## 2014-11-06 MED ORDER — PANTOPRAZOLE SODIUM 40 MG PO TBEC
40.0000 mg | DELAYED_RELEASE_TABLET | Freq: Every day | ORAL | Status: DC
  Administered 2014-11-06 – 2014-11-09 (×4): 40 mg via ORAL
  Filled 2014-11-06 (×5): qty 1

## 2014-11-06 NOTE — Consult Note (Signed)
CARDIOLOGY CONSULT NOTE  Patient ID: [name], MRN: [id number], DOB/AGE: January 25, 1946 69 y.o. Admit date: 11/06/2014 Date of Consult: 11/06/2014  Primary Physician: [name], MD Primary Cardiologist: ?  Chief Complaint: *presyncope   HPI [name] is a 69 y.o. male gentleman who is homeless and lives at the [facility] facility admitted after a fall.  He stood this am, felt light headed and fell   He mostly walks with a wheel chair IP orthostatics abnormal ( and incomplete) with HR increase 83--107 but without prolonged standing Diet has been decreased since abd surgery, but no acute changes  Drinking and urinating normally  Denies prior problem with LH and orthostasis and/or syncope  He has a history of permanent atrial fibrillation and underwent pacemaker implantation with generator replacement 2010 by Dr. Elliot Cousin. [facility]. Jude)  E has been lost to followup    He was recently admitted with GI bleeding and found to have a cecal wall and rectal mass. He underwent subtotal colectomy 9/48 was complicated by respiratory failure requiring intubation. 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 Bone marrow biopsy was recommended for disease burden assessment.     Past Medical History  Diagnosis Date  . Depression   . Hypomagnesemia   . Dyspepsia   . Fall at nursing home 05/27/2013    "slipped on water; said I broke my right hip" (05/27/2013)  . Migraine     "used to have them bad; haven't had one in awhile" (05/27/2013)  . Gout attack     "not too long ago; in my right foot" (05/27/2013)  . Complete heart block     reason for pacemaker/notes 05/27/2013  . Atrial fibrillation     [name] 05/27/2013  . HLD (hyperlipidemia)   . GIB (gastrointestinal bleeding)   . BPH (benign prostatic hyperplasia)       Surgical History:  Past Surgical History  Procedure Laterality Date  .  Insert / replace / remove pacemaker    . Appendectomy    . Cholecystectomy    . Open reduction of hip Right 05/28/2013    Procedure: OPEN REDUCTION OF HIP;  Surgeon: Renette Butters, MD;  Location: [place];  Service: Orthopedics;  Laterality: Right;  . Esophagogastroduodenoscopy (egd) with propofol N/A 09/26/2014    Procedure: ESOPHAGOGASTRODUODENOSCOPY (EGD) WITH PROPOFOL;  Surgeon: Jerene Bears, MD;  Location[facility] ENDOSCOPY;  Service: Endoscopy;  Laterality: N/A;  . Colonoscopy N/A 10/01/2014    Procedure: COLONOSCOPY;  Surgeon: Jerene Bears, MD;  Location[facility]COPY;  Service: Endoscopy;  Laterality: N/A;  . Partial colectomy N/A 10/05/2014    Procedure: SUBTOTAL COLECTOMY WITH ILEORECTAL ANASTOMOSIS;  Surgeon: Donnie Mesa, MD;  Location: [place];  Service: General;  Laterality: N/A;     Home Meds: Prior to Admission medications   Medication Sig Start Date End Date Taking? Authorizing Provider  aspirin 81 MG chewable tablet Chew 81 mg by mouth daily.   Yes Historical Provider, MD  Calcium Carbonate-Vit D-Min (CALTRATE 600+D PLUS MINERALS) 600-800 MG-UNIT TABS Take 1 tablet by mouth daily.   Yes Historical Provider, MD  carvedilol (COREG) 12.5 MG tablet Take 12.5 mg by mouth 2 (two) times daily with a meal.   Yes Historical Provider, MD  docusate sodium (COLACE) 100 MG capsule Take 1 capsule (100 mg total) by mouth 2 (two) times daily. Continue this while taking narcotics to help with bowel  movements 05/28/13  Yes Renette Butters, MD  finasteride (PROSCAR) 5 MG tablet Take 5 mg by mouth every morning.   Yes Historical Provider, MD  magnesium oxide (MAG-OX) 400 MG tablet Take 400 mg by mouth 2 (two) times daily.   Yes Historical Provider, MD  oxyCODONE-acetaminophen (PERCOCET/ROXICET) 5-325 MG per tablet Take 1 tablet by mouth every 4 (four) hours as needed for moderate pain. 10/17/14  Yes Domenic Polite, MD  pravastatin (PRAVACHOL) 10 MG tablet Take 10 mg by mouth at bedtime.   Yes Historical  Provider, MD  rivaroxaban (XARELTO) 20 MG TABS tablet Take 1 tablet (20 mg total) by mouth every morning. Resume 4/19 09/27/14  Yes Shanker Kristeen Mans, MD  sertraline (ZOLOFT) 50 MG tablet Take 50 mg by mouth every morning.    Yes Historical Provider, MD  traMADol (ULTRAM) 50 MG tablet Take 1 tablet (50 mg total) by mouth every 6 (six) hours as needed. 09/26/14  Yes Shanker Kristeen Mans, MD  zolpidem (AMBIEN) 5 MG tablet Take 5 mg by mouth at bedtime.   Yes Historical Provider, MD  loperamide (IMODIUM) 2 MG capsule Take 1 capsule (2 mg total) by mouth as needed for diarrhea or loose stools (you may give 54m of imodium after each loose stool, up to 16 mg total). Patient not taking: Reported on 10/28/2014 10/17/14   PDomenic Polite MD    Inpatient Medications:  . [START ON 11/07/2014] calcium-vitamin D  1 tablet Oral Q breakfast  . carvedilol  12.5 mg Oral BID WC  . docusate sodium  100 mg Oral BID  . feeding supplement (ENSURE)  1 Container Oral TID BM  . [START ON 11/07/2014] finasteride  5 mg Oral BH-q7a  . magnesium oxide  400 mg Oral BID  . pantoprazole  40 mg Oral Daily  . pravastatin  10 mg Oral QHS  . rivaroxaban  20 mg Oral q morning - 10a  . sertraline  50 mg Oral q morning - 10a  . zolpidem  5 mg Oral QHS    Allergies: No Known Allergies  History   Social History  . Marital Status: Divorced    Spouse Name: N/A  . Number of Children: N/A  . Years of Education: N/A   Occupational History  . Not on file.   Social History Main Topics  . Smoking status: Former Smoker -- 0.25 packs/day for 61 years    Types: Cigarettes  . Smokeless tobacco: Former [employer] developer   Types: Snuff, Chew     Comment: 05/27/2013 "haven't used chew or snuff in ~ 30 yr"  . Alcohol Use: Yes     Comment: 05/27/2013 "none in years; never had problem w/it" - not in 30 years  . Drug Use: No  . Sexual Activity: No   Other Topics Concern  . Not on file   Social History Narrative     Family History  Problem  Relation Age of Onset  . Colon cancer Mother      ROS:  Please see the history of present illness.   Negative aprt from aboveAll other systems reviewed and negative.    Physical Exam   Blood pressure 115/60, pulse 64, temperature 97.8 F (36.6 C), temperature source Oral, resp. rate 18, height '5\' 9"'  (1.753 m), weight 42.774 kg (94 lb 4.8 oz), SpO2 100 %. General: cachectic caucasian  male in no acute distress. Head: Normocephalic, atraumatic, sclera non-icteric, no xanthomas, nares are without discharge.temporal wasting  EENT: normal  Lymph Nodes:  none Neck: Negative for carotid bruits. JVD not elevated. Back:without scoliosis kyphosis  Lungs: Clear bilaterally to auscultation without wheezes, rales, or rhonchi. Breathing is unlabored. Heart: irregular rate and rhythm  . No rubs, or gallops appreciated. Abdomen: Soft, non-tender, non-distended with normoactive bowel sounds. No hepatomegaly. No rebound/guarding. No obvious abdominal masses. Msk:  Strength and tone appear normal for age. Extremities: No clubbing or cyanosis. No  edema.  Distal pedal pulses are 2+ and equal bilaterally. Skin: Warm and Dry Neuro: Alert and oriented X 3. CN III-XII intact Grossly normal sensory and motor function . Psych:  Responds to questions appropriately with a normal affect.      Labs: Cardiac Enzymes No results for input(s): CKTOTAL, CKMB, TROPONINI in the last 72 hours. CBC Lab Results  Component Value Date   WBC 5.9 11/06/2014   HGB 9.9* 11/06/2014   HCT 31.5* 11/06/2014   MCV 97.8 11/06/2014   PLT 143* 11/06/2014   PROTIME:  Recent Labs  11/06/14 0935  LABPROT 18.5*  INR 1.54*   Chemistry  Recent Labs Lab 11/06/14 0935  NA 136  K 3.8  CL 106  CO2 21*  BUN 21*  CREATININE 0.59*  CALCIUM 8.6*  GLUCOSE 94   Lipids Lab Results  Component Value Date   CHOL 110 09/30/2014   HDL 35* 09/30/2014   LDLCALC 62 09/30/2014   TRIG 101 10/10/2014   BNP No results found for:  PROBNP Thyroid Function Tests: No results for input(s): TSH, T4TOTAL, T3FREE, THYROIDAB in the last 72 hours.  Invalid input(s): FREET3 Miscellaneous No results found for: DDIMER  Radiology/Studies:  Ct Head Wo Contrast  11/06/2014   CLINICAL DATA:  Fall, on Xarelto  EXAM: CT HEAD WITHOUT CONTRAST  TECHNIQUE: Contiguous axial images were obtained from the base of the skull through the vertex without intravenous contrast.  COMPARISON:  10/27/2006  FINDINGS: Motion degraded images.  No evidence of parenchymal hemorrhage or extra-axial fluid collection. No mass lesion, mass effect, or midline shift.  No CT evidence of acute infarction.  Subcortical white matter and periventricular small vessel ischemic changes. Intracranial atherosclerosis.  Global cortical atrophy.  No ventriculomegaly.  The visualized paranasal sinuses are essentially clear. The mastoid air cells are unopacified.  No evidence of calvarial fracture.  IMPRESSION: No evidence of acute intracranial abnormality.  Atrophy with small vessel ischemic changes.   Electronically Signed   By: Julian Hy M.D.   On: 11/06/2014 10:08   Dg Chest Port 1 View  10/13/2014   CLINICAL DATA:  Rectal mass.  EXAM: PORTABLE CHEST - 1 VIEW  COMPARISON:  10/09/2014  FINDINGS: The nasogastric tube has been removed. There are intact appearances of the transvenous leads. The left jugular central line extends into the SVC. Basilar airspace opacities are present bilaterally, and there probably also is a left pleural effusion. There is worsened consolidation in the medial bases.  IMPRESSION: Worsened basilar consolidation and probable small pleural effusion.   Electronically Signed   By: Andreas Newport M.D.   On: 10/13/2014 06:00   Dg Chest Port 1 View  10/09/2014   CLINICAL DATA:  Pneumonia.  EXAM: PORTABLE CHEST - 1 VIEW  COMPARISON:  10/08/2014  FINDINGS: Left IJ central line tip is unchanged, tip overlying the level of the superior vena cava. Right-sided  pacer leads overlie the right atrium and right ventricle. Nasogastric tube tip is off the film but beyond the gastroesophageal junction. There is increased right-sided pleural effusion. Dense opacity at the left lung  base obscures the hemidiaphragm. There is a left pleural effusion.  IMPRESSION: 1. Bilateral pleural effusions, increased on the right. 2. Persistent dense left lower lobe infiltrate.   Electronically Signed   By: Nolon Nations M.D.   On: 10/09/2014 09:20   Dg Chest Port 1 View  10/08/2014   CLINICAL DATA:  Nasogastric tube placement. Atrial fibrillation. History of smoking.  EXAM: PORTABLE CHEST - 1 VIEW  COMPARISON:  10/08/2014 and earlier  FINDINGS: Left IJ central line has been placed, tip overlying the superior vena cava. Nasogastric tube is in place, tip not well seen but beyond the gastroesophageal junction.  Right-sided transvenous pacemaker leads overlie the right atrium and right ventricle. The heart is enlarged. There is dense opacity at the left lung base consistent with infiltrate.  There is lucency below the base the heart, suspicious for pneumopericardium or pneumoperitoneum. Further evaluation with left lateral decubitus view of the abdomen is recommended.  IMPRESSION: 1. Interval placement of left IJ central line.  No pneumothorax. 2. Nasogastric tube placed, tip beyond the gastroesophageal junction. 3. Question of abnormal gas collection in the upper abdomen. See above. Recommend left lateral decubitus view of the abdomen to exclude free intraperitoneal air. 4. The salient findings were discussed with Dr. Emmit Alexanders on 10/08/2014 at 6:58 pm.   Electronically Signed   By: Nolon Nations M.D.   On: 10/08/2014 19:00   Dg Chest Port 1 View  10/08/2014   CLINICAL DATA:  Hypoxia  EXAM: PORTABLE CHEST - 1 VIEW  COMPARISON:  October 07, 2014  FINDINGS: Endotracheal tube tip is 3.0 cm above the carina. Central catheter tip is in the superior vena cava. Nasogastric tube tip and side port  are in the stomach. No pneumothorax. Pacemaker leads are attached to the right atrium right ventricle. Heart size and pulmonary vascularity are normal.  There is persistent patchy infiltrate in the left lower lobe. Lungs are otherwise clear. No adenopathy.  IMPRESSION: Tube and catheter positions as described without pneumothorax. Persistent left lower lobe infiltrate without appreciable change. No new opacity. No change in cardiac silhouette.   Electronically Signed   By: Lowella Grip III M.D.   On: 10/08/2014 07:23   Dg Abd Decub  10/08/2014   CLINICAL DATA:  Evaluate for free intraperitoneal air. Chest radiograph demonstrating possible free intraperitoneal air.  EXAM: ABDOMEN - 1 VIEW DECUBITUS  COMPARISON:  CT of 09/30/2014  FINDINGS: Right-sided decubitus view demonstrates lucency in the right upper quadrant for which trace free intraperitoneal air is suspected. Midline laparotomy. Normal small bowel caliber. Right proximal femoral fixation.  IMPRESSION: 1. Suspicion of small volume right upper quadrant free intraperitoneal air. Critical test results telephoned to Kathlee Nations, r.n. at the time of interpretation at 10:39 p.m.on 10/08/2014. Consider dedicated abdominal series including left side up decubitus imaging. 2. No bowel distension.   Electronically Signed   By: Abigail Miyamoto M.D.   On: 10/08/2014 22:41   Dg Hip Unilat With Pelvis 2-3 Views Left  11/06/2014   CLINICAL DATA:  Dizziness and fall with left hip pain.  EXAM: LEFT HIP (WITH PELVIS) 2-3 VIEWS  COMPARISON:  05/28/2013 and CT 09/30/2014  FINDINGS: Examination demonstrates diffuse decreased bone mineralization. There are mild symmetric degenerative changes of the hips. Fixation hardware over the right femur is intact and unchanged. No evidence of acute fracture dislocation of the left hip/pelvis. There are degenerative changes of the spine as well as calcified plaque over the abdominal aorta and iliac arteries.  IMPRESSION: No  acute findings.    Electronically Signed   By: Marin Olp M.D.   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 Jude  B-cell lymphoma evaluation ongoing    Orthostatic VS and Bun/cr ratio both speak to volume depletion as the likely explanation of his event( finasteride is long standing)   Would hydrate gently  Device was interrogated and function is normal.  Aspirin should be discontinued as it does not add benefit to Rivaroxaban (already done)    Virl Axe

## 2014-11-06 NOTE — Progress Notes (Signed)
  Pill found on floor next to pt recliner where he is sitting.   Looked pill up: Pill is:  Drug: Acetaminophen and Oxycodone Hydrochloride Strength: 325 mg / 5 mg Pill Imprint: 512 Color: White Shape: Round  This RN disposed of pill in sharps box with Jacqualin Combes, RN.   Pt educated on not taking any of his home meds while he is here at the hospital. Will continue to monitor.

## 2014-11-06 NOTE — ED Notes (Signed)
Patient stood up at bedside and stated he didn't feel dizzy.

## 2014-11-06 NOTE — ED Notes (Signed)
Patient transported to CT 

## 2014-11-06 NOTE — ED Notes (Addendum)
Patient returned from X-ray 

## 2014-11-06 NOTE — H&P (Signed)
Patient Demographics  [name], is a 69 y.o. male  MRN: [id number]   DOB - Jun 13, 1945  Admit Date - 11/06/2014  Outpatient Primary MD for the patient is [name], MD   With History of -  Past Medical History  Diagnosis Date  . Depression   . Hypomagnesemia   . Dyspepsia   . Fall at nursing home 05/27/2013    "slipped on water; said I broke my right hip" (05/27/2013)  . Migraine     "used to have them bad; haven't had one in awhile" (05/27/2013)  . Gout attack     "not too long ago; in my right foot" (05/27/2013)  . Complete heart block     reason for pacemaker/notes 05/27/2013  . Atrial fibrillation     [name] 05/27/2013  . HLD (hyperlipidemia)   . GIB (gastrointestinal bleeding)   . BPH (benign prostatic hyperplasia)       Past Surgical History  Procedure Laterality Date  . Insert / replace / remove pacemaker    . Appendectomy    . Cholecystectomy    . Open reduction of hip Right 05/28/2013    Procedure: OPEN REDUCTION OF HIP;  Surgeon: Renette Butters, MD;  Location: [facility];  Service: Orthopedics;  Laterality: Right;  . Esophagogastroduodenoscopy (egd) with propofol N/A 09/26/2014    Procedure: ESOPHAGOGASTRODUODENOSCOPY (EGD) WITH PROPOFOL;  Surgeon: Jerene Bears, MD;  Location: [facility] ENDOSCOPY;  Service: Endoscopy;  Laterality: N/A;  . Colonoscopy N/A 10/01/2014    Procedure: COLONOSCOPY;  Surgeon: Jerene Bears, MD;  Location: [facility] ENDOSCOPY;  Service: Endoscopy;  Laterality: N/A;  . Partial colectomy N/A 10/05/2014    Procedure: SUBTOTAL COLECTOMY WITH ILEORECTAL ANASTOMOSIS;  Surgeon: Donnie Mesa, MD;  Location: [facility];  Service: General;  Laterality: N/A;    in for   Chief Complaint  Patient presents with  . Fall  . Hip Pain     HPI  [name]  is a 69 y.o. male, with past medical history of atrial fibrillation on chronic anticoagulation with Xarelto, history of complete heart block status post pacemaker implantation, history of coronary artery  disease, with recent lengthy hospitalization significant for cecal and rectal mass status post total colectomy with ileorectal anastomosis with biopsy significant for tubular adenoma with high-grade dysplasia, been followed as an outpatient by Dr. Burr Medico for low-grade non-Hodgkin B-cell lymphoma, patient presents from assisted living facility for an episode of dizziness and fall, patient reports at baseline he is ambulatory with a wheelchair, unable to tolerate cane or walker, reports was trying to stand up this morning, as wheelchair was not present, reports he felt dizzy, lightheaded, and then he fell on the floor, denies any loss of consciousness, workup in ED was significant for Hemoccult-positive stool, but normal color stool, no melanoma or bright red blood per rectum, patient was orthostatic by heart rate criteria, not blood pressure criteria, specialists requested to admit the patient for further evaluation.  Review of Systems    In addition to the HPI above,  No Fever-chills, No Headache, No changes with Vision or hearing, No problems swallowing food or Liquids, reports poor appetite No Chest pain, Cough or Shortness of Breath, and an episode of near syncope. No Abdominal pain, No Nausea or Vommitting, Bowel movements are regular, No Blood in stool or Urine, No dysuria, No new skin rashes or bruises, No new joints pains-aches,  No new weakness, tingling, numbness in any extremity, patient was using a wheelchair at  baseline,  No recent weight gain or loss, No polyuria, polydypsia or polyphagia, No significant Mental Stressors.  A full 10 point Review of Systems was done, except as stated above, all other Review of Systems were negative.   Social History History  Substance Use Topics  . Smoking status: Former Smoker -- 0.25 packs/day for 61 years    Types: Cigarettes  . Smokeless tobacco: Former Systems developer    Types: Snuff, Chew     Comment: 05/27/2013 "haven't used chew or snuff in ~ 30  yr"  . Alcohol Use: Yes     Comment: 05/27/2013 "none in years; never had problem w/it" - not in 30 years     Family History Family History  Problem Relation Age of Onset  . Colon cancer Mother      Prior to Admission medications   Medication Sig Start Date End Date Taking? Authorizing Provider  aspirin 81 MG chewable tablet Chew 81 mg by mouth daily.   Yes Historical Provider, MD  Calcium Carbonate-Vit D-Min (CALTRATE 600+D PLUS MINERALS) 600-800 MG-UNIT TABS Take 1 tablet by mouth daily.   Yes Historical Provider, MD  carvedilol (COREG) 12.5 MG tablet Take 12.5 mg by mouth 2 (two) times daily with a meal.   Yes Historical Provider, MD  docusate sodium (COLACE) 100 MG capsule Take 1 capsule (100 mg total) by mouth 2 (two) times daily. Continue this while taking narcotics to help with bowel movements 05/28/13  Yes Renette Butters, MD  finasteride (PROSCAR) 5 MG tablet Take 5 mg by mouth every morning.   Yes Historical Provider, MD  magnesium oxide (MAG-OX) 400 MG tablet Take 400 mg by mouth 2 (two) times daily.   Yes Historical Provider, MD  oxyCODONE-acetaminophen (PERCOCET/ROXICET) 5-325 MG per tablet Take 1 tablet by mouth every 4 (four) hours as needed for moderate pain. 10/17/14  Yes Domenic Polite, MD  pravastatin (PRAVACHOL) 10 MG tablet Take 10 mg by mouth at bedtime.   Yes Historical Provider, MD  rivaroxaban (XARELTO) 20 MG TABS tablet Take 1 tablet (20 mg total) by mouth every morning. Resume 4/19 09/27/14  Yes Shanker Kristeen Mans, MD  sertraline (ZOLOFT) 50 MG tablet Take 50 mg by mouth every morning.    Yes Historical Provider, MD  traMADol (ULTRAM) 50 MG tablet Take 1 tablet (50 mg total) by mouth every 6 (six) hours as needed. 09/26/14  Yes Shanker Kristeen Mans, MD  zolpidem (AMBIEN) 5 MG tablet Take 5 mg by mouth at bedtime.   Yes Historical Provider, MD  loperamide (IMODIUM) 2 MG capsule Take 1 capsule (2 mg total) by mouth as needed for diarrhea or loose stools (you may give 2mg   of imodium after each loose stool, up to 16 mg total). Patient not taking: Reported on 10/28/2014 10/17/14   Domenic Polite, MD    No Known Allergies  Physical Exam  Vitals  Blood pressure 128/74, pulse 87, temperature 98.6 F (37 C), temperature source Oral, resp. rate 20, height 5\' 9"  (1.753 m), weight 43.092 kg (95 lb), SpO2 98 %.   1. General, frail, elderly thin-appearing male lying in bed in NAD,    2. Normal affect and insight, Not Suicidal or Homicidal, Awake Alert, Oriented X 3.  3. No F.N deficits, ALL C.Nerves Intact, motor strength grossly intact, Sensation intact all 4 extremities, Plantars down going.  4. Ears and Eyes appear Normal, Conjunctivae clear, PERRLA. Moist Oral Mucosa.  5. Supple Neck, No JVD, No cervical lymphadenopathy appriciated, No Carotid Bruits.  6. Symmetrical Chest wall movement, Good air movement bilaterally, CTAB.  7. Irregular, No Gallops, Rubs or Murmurs, No Parasternal Heave.  8. Positive Bowel Sounds, Abdomen Soft, No tenderness, No organomegaly appriciated,No rebound -guarding or rigidity.  9.  No Cyanosis, Normal Skin Turgor, No Skin Rash or Bruise.  10. Severe muscle wasting,  joints appear normal , no effusions, Normal ROM.    Data Review  CBC  Recent Labs Lab 11/06/14 0935  WBC 5.9  HGB 9.9*  HCT 31.5*  PLT 143*  MCV 97.8  MCH 30.7  MCHC 31.4  RDW 16.8*  LYMPHSABS 2.7  MONOABS 0.4  EOSABS 0.0  BASOSABS 0.0   ------------------------------------------------------------------------------------------------------------------  Chemistries   Recent Labs Lab 11/06/14 0935  NA 136  K 3.8  CL 106  CO2 21*  GLUCOSE 94  BUN 21*  CREATININE 0.59*  CALCIUM 8.6*   ------------------------------------------------------------------------------------------------------------------ estimated creatinine clearance is 54.6 mL/min (by C-G formula based on Cr of  0.59). ------------------------------------------------------------------------------------------------------------------ No results for input(s): TSH, T4TOTAL, T3FREE, THYROIDAB in the last 72 hours.  Invalid input(s): FREET3   Coagulation profile  Recent Labs Lab 11/06/14 0935  INR 1.54*   ------------------------------------------------------------------------------------------------------------------- No results for input(s): DDIMER in the last 72 hours. -------------------------------------------------------------------------------------------------------------------  Cardiac Enzymes No results for input(s): CKMB, TROPONINI, MYOGLOBIN in the last 168 hours.  Invalid input(s): CK ------------------------------------------------------------------------------------------------------------------ Invalid input(s): POCBNP   ---------------------------------------------------------------------------------------------------------------  Urinalysis    Component Value Date/Time   COLORURINE YELLOW 09/30/2014 0309   APPEARANCEUR CLEAR 09/30/2014 0309   LABSPEC 1.008 09/30/2014 0309   PHURINE 7.5 09/30/2014 0309   GLUCOSEU NEGATIVE 09/30/2014 0309   HGBUR NEGATIVE 09/30/2014 0309   BILIRUBINUR NEGATIVE 09/30/2014 0309   KETONESUR NEGATIVE 09/30/2014 0309   PROTEINUR NEGATIVE 09/30/2014 0309   UROBILINOGEN 0.2 09/30/2014 0309   NITRITE NEGATIVE 09/30/2014 0309   LEUKOCYTESUR NEGATIVE 09/30/2014 0309    ----------------------------------------------------------------------------------------------------------------  Imaging results:   Ct Head Wo Contrast  11/06/2014   CLINICAL DATA:  Fall, on Xarelto  EXAM: CT HEAD WITHOUT CONTRAST  TECHNIQUE: Contiguous axial images were obtained from the base of the skull through the vertex without intravenous contrast.  COMPARISON:  10/27/2006  FINDINGS: Motion degraded images.  No evidence of parenchymal hemorrhage or extra-axial fluid  collection. No mass lesion, mass effect, or midline shift.  No CT evidence of acute infarction.  Subcortical white matter and periventricular small vessel ischemic changes. Intracranial atherosclerosis.  Global cortical atrophy.  No ventriculomegaly.  The visualized paranasal sinuses are essentially clear. The mastoid air cells are unopacified.  No evidence of calvarial fracture.  IMPRESSION: No evidence of acute intracranial abnormality.  Atrophy with small vessel ischemic changes.   Electronically Signed   By: Julian Hy M.D.   On: 11/06/2014 10:08   Dg Hip Unilat With Pelvis 2-3 Views Left  11/06/2014   CLINICAL DATA:  Dizziness and fall with left hip pain.  EXAM: LEFT HIP (WITH PELVIS) 2-3 VIEWS  COMPARISON:  05/28/2013 and CT 09/30/2014  FINDINGS: Examination demonstrates diffuse decreased bone mineralization. There are mild symmetric degenerative changes of the hips. Fixation hardware over the right femur is intact and unchanged. No evidence of acute fracture dislocation of the left hip/pelvis. There are degenerative changes of the spine as well as calcified plaque over the abdominal aorta and iliac arteries.  IMPRESSION: No acute findings.   Electronically Signed   By: Marin Olp M.D.   On: 11/06/2014 10:13    My personal review of EKG: Rhythm NSR, Rate 104   /  min, QTc 493  , no Acute ST changes    Assessment & Plan  Active Problems:   Protein-calorie malnutrition   Atrial fibrillation   Sigmoid & cecal bleeding colon masses s/p abdominal colectomy 10/05/2014   Near syncope  Near syncope - Patient is orthostatic by heart rate criteria, unclear etiology, but patient is wheelchair dependent at baseline, so very likely deconditioning Levada Dy is weakness contributing to it, as well he appears to be malnourished, with significant cardiac history, so he will be admitted for further management. - We'll start on IV fluids 75 mL/h. - Continue on telemetry monitoring. - Check urinalysis -  Consult PT - Will request cardiology to interrogate pacemaker.  Hemoccult-positive stool - Patient with recent abdominal surgery, on anticoagulation with Xarelto, no evidence of active bleed, as discussed with surgery is expected to be Hemoccult-positive stool given his recent surgery and he is still on anticoagulation ,no melena, no bright red blood per rectum, hemoglobin is stable at baseline, recheck CBC in a.m., start on Protonix given history of gastritis in the past .   atrial fibrillation  - CHA2DS2-VASc Score is 2, continue with Xarelto  - Continue with Coreg for heart rate controlled   Protein calorie malnutrition  - Resume on diet , start on ensure .  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  Code Status Full code, confirmed by the patient  Likely DC to  back to ALF once stable   Condition GUARDED    Time spent in minutes : 55 minutes    Mandeep Ferch M.D on 11/06/2014 at 11:43 AM  Between 7am to 7pm - Pager - [phone]  After 7pm go to [email or web address] - password [email or web address]  And look for the night coverage person covering me after hours  [facility] Office  [phone]

## 2014-11-06 NOTE — ED Provider Notes (Signed)
CSN: [id number]     Arrival date & time 11/06/14  0900 History   First MD Initiated Contact with Patient 11/06/14 [phone]     Chief Complaint  Patient presents with  . Fall  . Hip Pain     (Consider location/radiation/quality/duration/timing/severity/associated sxs/prior Treatment) HPI 69 year old male presents after a fall after standing up out of bed. Patient states he stood up and immediately felt lightheaded. At this cause him to fall the ground but he states he did not pass out. The patient's complaining of some mild neck pain but is more concerned about his left hip hurting. Last year he broke his right hip and states this feels similar. He does however have chronic left hip pain and states he needs to get an injection in it. The patient was recently discharged from the hospital after having a subtotal colectomy for recurrent GI bleeds. He is currently on Xarelto but denies melena or bright red blood per rectum. He does not think he hit his head but did hit his neck. Denies ever having chest pain or shortness of breath. Feels normal besides the pain at this point.  Past Medical History  Diagnosis Date  . Depression   . Hypomagnesemia   . Dyspepsia   . Fall at nursing home 05/27/2013    "slipped on water; said I broke my right hip" (05/27/2013)  . Migraine     "used to have them bad; haven't had one in awhile" (05/27/2013)  . Gout attack     "not too long ago; in my right foot" (05/27/2013)  . Complete heart block     reason for pacemaker/notes 05/27/2013  . Atrial fibrillation     [name] 05/27/2013  . HLD (hyperlipidemia)   . GIB (gastrointestinal bleeding)   . BPH (benign prostatic hyperplasia)    Past Surgical History  Procedure Laterality Date  . Insert / replace / remove pacemaker    . Appendectomy    . Cholecystectomy    . Open reduction of hip Right 05/28/2013    Procedure: OPEN REDUCTION OF HIP;  Surgeon: Renette Butters, MD;  Location: [facility];  Service: Orthopedics;   Laterality: Right;  . Esophagogastroduodenoscopy (egd) with propofol N/A 09/26/2014    Procedure: ESOPHAGOGASTRODUODENOSCOPY (EGD) WITH PROPOFOL;  Surgeon: Jerene Bears, MD;  Location: [facility] ENDOSCOPY;  Service: Endoscopy;  Laterality: N/A;  . Colonoscopy N/A 10/01/2014    Procedure: COLONOSCOPY;  Surgeon: Jerene Bears, MD;  Location[facility] ENDOSCOPY;  Service: Endoscopy;  Laterality: N/A;  . Partial colectomy N/A 10/05/2014    Procedure: SUBTOTAL COLECTOMY WITH ILEORECTAL ANASTOMOSIS;  Surgeon: Donnie Mesa, MD;  Location: [facility];  Service: General;  Laterality: N/A;   Family History  Problem Relation Age of Onset  . Colon cancer Mother    History  Substance Use Topics  . Smoking status: Former Smoker -- 0.25 packs/day for 61 years    Types: Cigarettes  . Smokeless tobacco: Former Systems developer    Types: Snuff, Chew     Comment: 05/27/2013 "haven't used chew or snuff in ~ 30 yr"  . Alcohol Use: Yes     Comment: 05/27/2013 "none in years; never had problem w/it" - not in 30 years    Review of Systems  Respiratory: Negative for shortness of breath.   Cardiovascular: Negative for chest pain and palpitations.  Gastrointestinal: Positive for abdominal pain (baseline per patient, always has abd pain since his surgery). Negative for vomiting and blood in stool.  Musculoskeletal: Positive for arthralgias  and neck pain.  Neurological: Positive for light-headedness. Negative for syncope, weakness and headaches.  All other systems reviewed and are negative.     Allergies  Review of patient's allergies indicates no known allergies.  Home Medications   Prior to Admission medications   Medication Sig Start Date End Date Taking? Authorizing Provider  Calcium Carbonate-Vit D-Min (CALTRATE 600+D PLUS MINERALS) 600-800 MG-UNIT TABS Take 1 tablet by mouth daily.    Historical Provider, MD  carvedilol (COREG) 12.5 MG tablet Take 12.5 mg by mouth 2 (two) times daily with a meal.    Historical Provider, MD   docusate sodium (COLACE) 100 MG capsule Take 1 capsule (100 mg total) by mouth 2 (two) times daily. Continue this while taking narcotics to help with bowel movements 05/28/13   Renette Butters, MD  finasteride (PROSCAR) 5 MG tablet Take 5 mg by mouth every morning.    Historical Provider, MD  loperamide (IMODIUM) 2 MG capsule Take 1 capsule (2 mg total) by mouth as needed for diarrhea or loose stools (you may give 2mg  of imodium after each loose stool, up to 16 mg total). Patient not taking: Reported on 10/28/2014 10/17/14   Domenic Polite, MD  magnesium oxide (MAG-OX) 400 MG tablet Take 400 mg by mouth 2 (two) times daily.    Historical Provider, MD  oxyCODONE-acetaminophen (PERCOCET/ROXICET) 5-325 MG per tablet Take 1 tablet by mouth every 4 (four) hours as needed for moderate pain. 10/17/14   Domenic Polite, MD  pravastatin (PRAVACHOL) 10 MG tablet Take 10 mg by mouth at bedtime.    Historical Provider, MD  rivaroxaban (XARELTO) 20 MG TABS tablet Take 1 tablet (20 mg total) by mouth every morning. Resume 4/19 09/27/14   Jonetta Osgood, MD  sertraline (ZOLOFT) 50 MG tablet Take 50 mg by mouth every morning.     Historical Provider, MD  traMADol (ULTRAM) 50 MG tablet Take 1 tablet (50 mg total) by mouth every 6 (six) hours as needed. 09/26/14   Shanker Kristeen Mans, MD  zolpidem (AMBIEN) 5 MG tablet Take 5 mg by mouth at bedtime.    Historical Provider, MD   BP 131/66 mmHg  Pulse 72  Temp(Src) 98.6 F (37 C) (Oral)  Resp 19  Ht 5\' 9"  (1.753 m)  Wt 95 lb (43.092 kg)  BMI 14.02 kg/m2  SpO2 100% Physical Exam  Constitutional: He is oriented to person, place, and time. He appears cachectic.  HENT:  Head: Normocephalic and atraumatic.  Right Ear: External ear normal.  Left Ear: External ear normal.  Nose: Nose normal.  Eyes: Right eye exhibits no discharge. Left eye exhibits no discharge.  Neck: Normal range of motion. Neck supple. Muscular tenderness (mild) present. No spinous process  tenderness present. Normal range of motion present.  Cardiovascular: Normal rate, normal heart sounds and intact distal pulses.  An irregularly irregular rhythm present.  Pulmonary/Chest: Effort normal.  Abdominal: Soft. There is no tenderness.  Genitourinary: Rectal exam shows no external hemorrhoid, no fissure, no mass and anal tone normal.  Brown stool, heme-positive  Musculoskeletal: He exhibits no edema.       Left hip: He exhibits decreased range of motion and tenderness.  Neurological: He is alert and oriented to person, place, and time.  Skin: Skin is warm and dry.  Nursing note and vitals reviewed.   ED Course  Procedures (including critical care time) Labs Review Labs Reviewed  BASIC METABOLIC PANEL - Abnormal; Notable for the following:    CO2  21 (*)    BUN 21 (*)    Creatinine, Ser 0.59 (*)    Calcium 8.6 (*)    All other components within normal limits  CBC WITH DIFFERENTIAL/PLATELET - Abnormal; Notable for the following:    RBC 3.22 (*)    Hemoglobin 9.9 (*)    HCT 31.5 (*)    RDW 16.8 (*)    Platelets 143 (*)    All other components within normal limits  PROTIME-INR - Abnormal; Notable for the following:    Prothrombin Time 18.5 (*)    INR 1.54 (*)    All other components within normal limits  POC OCCULT BLOOD, ED - Abnormal; Notable for the following:    Fecal Occult Bld POSITIVE (*)    All other components within normal limits  TYPE AND SCREEN    Imaging Review Ct Head Wo Contrast  11/06/2014   CLINICAL DATA:  Fall, on Xarelto  EXAM: CT HEAD WITHOUT CONTRAST  TECHNIQUE: Contiguous axial images were obtained from the base of the skull through the vertex without intravenous contrast.  COMPARISON:  10/27/2006  FINDINGS: Motion degraded images.  No evidence of parenchymal hemorrhage or extra-axial fluid collection. No mass lesion, mass effect, or midline shift.  No CT evidence of acute infarction.  Subcortical white matter and periventricular small vessel  ischemic changes. Intracranial atherosclerosis.  Global cortical atrophy.  No ventriculomegaly.  The visualized paranasal sinuses are essentially clear. The mastoid air cells are unopacified.  No evidence of calvarial fracture.  IMPRESSION: No evidence of acute intracranial abnormality.  Atrophy with small vessel ischemic changes.   Electronically Signed   By: Julian Hy M.D.   On: 11/06/2014 10:08   Dg Hip Unilat With Pelvis 2-3 Views Left  11/06/2014   CLINICAL DATA:  Dizziness and fall with left hip pain.  EXAM: LEFT HIP (WITH PELVIS) 2-3 VIEWS  COMPARISON:  05/28/2013 and CT 09/30/2014  FINDINGS: Examination demonstrates diffuse decreased bone mineralization. There are mild symmetric degenerative changes of the hips. Fixation hardware over the right femur is intact and unchanged. No evidence of acute fracture dislocation of the left hip/pelvis. There are degenerative changes of the spine as well as calcified plaque over the abdominal aorta and iliac arteries.  IMPRESSION: No acute findings.   Electronically Signed   By: Marin Olp M.D.   On: 11/06/2014 10:13     EKG Interpretation   Date/Time:  Sunday Nov 06 2014 09:14:33 EDT Ventricular Rate:  104 PR Interval:    QRS Duration: 102 QT Interval:  375 QTC Calculation: 493 R Axis:   -167 Text Interpretation:  Atrial fibrillation Anterior infarct, old no acute  ST/T changes no significant change since September 30 2014 Confirmed by  Regenia Skeeter  MD, [place] ([id number]) on 11/06/2014 9:19:10 AM      MDM   Final diagnoses:  Fall, initial encounter    Patient with a near syncopal episode and subsequent fall. No evidence of a hip fracture and he was able to ambulate and bear weight on the hip and thus I doubt occult fracture. However he did get near syncopal with a mild drop in his hemoglobin and positive blood in his stool while on Xarelto. Given this he will be observed in the hospital with serial hemoglobins and further monitoring.    Sherwood Gambler, MD 11/06/14 1710

## 2014-11-06 NOTE — ED Notes (Signed)
Reports getting up to go outside to smoke and felt dizzy and fell; denies hitting head on fall; no LOC; reports left hip pain. Able to stand, bear weight, and self-transfer to EMS stretcher. Alert and oriented x 4. Denies dizziness on arrival.

## 2014-11-07 ENCOUNTER — Encounter (HOSPITAL_COMMUNITY)
Admission: EM | Disposition: A | Discharge status: Home Health | Payer: Self-pay | Source: Home / Self Care | Attending: Internal Medicine

## 2014-11-07 ENCOUNTER — Encounter (HOSPITAL_COMMUNITY): Payer: Self-pay | Admitting: Nurse Practitioner

## 2014-11-07 DIAGNOSIS — I951 Orthostatic hypotension: Secondary | ICD-10-CM | POA: Diagnosis present

## 2014-11-07 DIAGNOSIS — K625 Hemorrhage of anus and rectum: Secondary | ICD-10-CM | POA: Diagnosis not present

## 2014-11-07 DIAGNOSIS — D62 Acute posthemorrhagic anemia: Secondary | ICD-10-CM | POA: Diagnosis not present

## 2014-11-07 DIAGNOSIS — I482 Chronic atrial fibrillation: Secondary | ICD-10-CM | POA: Diagnosis present

## 2014-11-07 DIAGNOSIS — M7989 Other specified soft tissue disorders: Secondary | ICD-10-CM | POA: Diagnosis not present

## 2014-11-07 DIAGNOSIS — E785 Hyperlipidemia, unspecified: Secondary | ICD-10-CM | POA: Diagnosis present

## 2014-11-07 DIAGNOSIS — F329 Major depressive disorder, single episode, unspecified: Secondary | ICD-10-CM | POA: Diagnosis present

## 2014-11-07 DIAGNOSIS — W19XXXA Unspecified fall, initial encounter: Secondary | ICD-10-CM | POA: Diagnosis present

## 2014-11-07 DIAGNOSIS — R198 Other specified symptoms and signs involving the digestive system and abdomen: Secondary | ICD-10-CM | POA: Diagnosis not present

## 2014-11-07 DIAGNOSIS — Z7982 Long term (current) use of aspirin: Secondary | ICD-10-CM | POA: Diagnosis not present

## 2014-11-07 DIAGNOSIS — R64 Cachexia: Secondary | ICD-10-CM | POA: Diagnosis present

## 2014-11-07 DIAGNOSIS — D696 Thrombocytopenia, unspecified: Secondary | ICD-10-CM | POA: Diagnosis present

## 2014-11-07 DIAGNOSIS — K922 Gastrointestinal hemorrhage, unspecified: Secondary | ICD-10-CM | POA: Diagnosis not present

## 2014-11-07 DIAGNOSIS — C8510 Unspecified B-cell lymphoma, unspecified site: Secondary | ICD-10-CM | POA: Diagnosis present

## 2014-11-07 DIAGNOSIS — I481 Persistent atrial fibrillation: Secondary | ICD-10-CM

## 2014-11-07 DIAGNOSIS — M79609 Pain in unspecified limb: Secondary | ICD-10-CM | POA: Diagnosis not present

## 2014-11-07 DIAGNOSIS — Z950 Presence of cardiac pacemaker: Secondary | ICD-10-CM | POA: Diagnosis not present

## 2014-11-07 DIAGNOSIS — Z9889 Other specified postprocedural states: Secondary | ICD-10-CM | POA: Diagnosis not present

## 2014-11-07 DIAGNOSIS — Z86010 Personal history of colonic polyps: Secondary | ICD-10-CM | POA: Insufficient documentation

## 2014-11-07 DIAGNOSIS — Z860101 Personal history of adenomatous and serrated colon polyps: Secondary | ICD-10-CM | POA: Insufficient documentation

## 2014-11-07 DIAGNOSIS — E46 Unspecified protein-calorie malnutrition: Secondary | ICD-10-CM | POA: Diagnosis present

## 2014-11-07 DIAGNOSIS — K9289 Other specified diseases of the digestive system: Secondary | ICD-10-CM | POA: Diagnosis present

## 2014-11-07 DIAGNOSIS — E860 Dehydration: Secondary | ICD-10-CM | POA: Diagnosis present

## 2014-11-07 DIAGNOSIS — Z87891 Personal history of nicotine dependence: Secondary | ICD-10-CM | POA: Diagnosis not present

## 2014-11-07 DIAGNOSIS — Z7901 Long term (current) use of anticoagulants: Secondary | ICD-10-CM | POA: Diagnosis not present

## 2014-11-07 DIAGNOSIS — I2510 Atherosclerotic heart disease of native coronary artery without angina pectoris: Secondary | ICD-10-CM | POA: Diagnosis present

## 2014-11-07 DIAGNOSIS — K6289 Other specified diseases of anus and rectum: Secondary | ICD-10-CM | POA: Insufficient documentation

## 2014-11-07 DIAGNOSIS — Z9049 Acquired absence of other specified parts of digestive tract: Secondary | ICD-10-CM | POA: Insufficient documentation

## 2014-11-07 DIAGNOSIS — R55 Syncope and collapse: Secondary | ICD-10-CM | POA: Diagnosis not present

## 2014-11-07 DIAGNOSIS — N400 Enlarged prostate without lower urinary tract symptoms: Secondary | ICD-10-CM | POA: Diagnosis present

## 2014-11-07 DIAGNOSIS — K921 Melena: Secondary | ICD-10-CM | POA: Diagnosis present

## 2014-11-07 DIAGNOSIS — M25552 Pain in left hip: Secondary | ICD-10-CM | POA: Diagnosis present

## 2014-11-07 DIAGNOSIS — Z681 Body mass index (BMI) 19 or less, adult: Secondary | ICD-10-CM | POA: Diagnosis not present

## 2014-11-07 HISTORY — PX: FLEXIBLE SIGMOIDOSCOPY: SHX5431

## 2014-11-07 LAB — CBC
HCT: 25.7 % — ABNORMAL LOW (ref 39.0–52.0)
HCT: 27.9 % — ABNORMAL LOW (ref 39.0–52.0)
HEMATOCRIT: 28.8 % — AB (ref 39.0–52.0)
HEMOGLOBIN: 8.7 g/dL — AB (ref 13.0–17.0)
Hemoglobin: 8.2 g/dL — ABNORMAL LOW (ref 13.0–17.0)
Hemoglobin: 8.9 g/dL — ABNORMAL LOW (ref 13.0–17.0)
MCH: 31.4 pg (ref 26.0–34.0)
MCH: 30.9 pg (ref 26.0–34.0)
MCH: 31.2 pg (ref 26.0–34.0)
MCHC: 31.9 g/dL (ref 30.0–36.0)
MCHC: 30.9 g/dL (ref 30.0–36.0)
MCHC: 31.2 g/dL (ref 30.0–36.0)
MCV: 98.5 fL (ref 78.0–100.0)
MCV: 100 fL (ref 78.0–100.0)
MCV: 100 fL (ref 78.0–100.0)
PLATELETS: 128 10*3/uL — AB (ref 150–400)
Platelets: 125 10*3/uL — ABNORMAL LOW (ref 150–400)
Platelets: 135 10*3/uL — ABNORMAL LOW (ref 150–400)
RBC: 2.61 MIL/uL — ABNORMAL LOW (ref 4.22–5.81)
RBC: 2.88 MIL/uL — ABNORMAL LOW (ref 4.22–5.81)
RBC: 2.79 MIL/uL — ABNORMAL LOW (ref 4.22–5.81)
RDW: 17.1 % — ABNORMAL HIGH (ref 11.5–15.5)
RDW: 17.1 % — ABNORMAL HIGH (ref 11.5–15.5)
RDW: 16.8 % — ABNORMAL HIGH (ref 11.5–15.5)
WBC: 4.6 10*3/uL (ref 4.0–10.5)
WBC: 5.2 10*3/uL (ref 4.0–10.5)
WBC: 5.5 10*3/uL (ref 4.0–10.5)

## 2014-11-07 LAB — BASIC METABOLIC PANEL
ANION GAP: 5 (ref 5–15)
BUN: 21 mg/dL — AB (ref 6–20)
CO2: 26 mmol/L (ref 22–32)
Calcium: 8.1 mg/dL — ABNORMAL LOW (ref 8.9–10.3)
Chloride: 108 mmol/L (ref 101–111)
Creatinine, Ser: 0.63 mg/dL (ref 0.61–1.24)
GFR calc Af Amer: >60 mL/min (ref >60)
GFR calc non Af Amer: >60 mL/min (ref >60)
Glucose, Bld: 114 mg/dL — ABNORMAL HIGH (ref 65–99)
POTASSIUM: 3.7 mmol/L (ref 3.5–5.1)
SODIUM: 139 mmol/L (ref 135–145)

## 2014-11-07 LAB — CLOSTRIDIUM DIFFICILE BY PCR: Toxigenic C. Difficile by PCR: NEGATIVE

## 2014-11-07 LAB — URINALYSIS, ROUTINE W REFLEX MICROSCOPIC
BILIRUBIN URINE: NEGATIVE
Glucose, UA: NEGATIVE mg/dL
HGB URINE DIPSTICK: NEGATIVE
KETONES UR: NEGATIVE mg/dL
Leukocytes, UA: NEGATIVE
Nitrite: NEGATIVE
PH: 5 (ref 5.0–8.0)
PROTEIN: NEGATIVE mg/dL
SPECIFIC GRAVITY, URINE: 1.024 (ref 1.005–1.030)
Urobilinogen, UA: 0.2 mg/dL (ref 0.0–1.0)

## 2014-11-07 LAB — OCCULT BLOOD X 1 CARD TO LAB, STOOL: Fecal Occult Bld: POSITIVE — AB

## 2014-11-07 SURGERY — SIGMOIDOSCOPY, FLEXIBLE
Anesthesia: Moderate Sedation

## 2014-11-07 MED ORDER — MIDAZOLAM HCL 10 MG/2ML IJ SOLN
INTRAMUSCULAR | Status: DC | PRN
Start: 2014-11-07 — End: 2014-11-07
  Administered 2014-11-07 (×2): 1 mg via INTRAVENOUS

## 2014-11-07 MED ORDER — FENTANYL CITRATE (PF) 100 MCG/2ML IJ SOLN
INTRAMUSCULAR | Status: DC | PRN
  Administered 2014-11-07: 25 ug via INTRAVENOUS
  Administered 2014-11-07: 12.5 ug via INTRAVENOUS

## 2014-11-07 MED ORDER — FENTANYL CITRATE (PF) 100 MCG/2ML IJ SOLN
INTRAMUSCULAR | Status: AC
  Filled 2014-11-07: qty 2

## 2014-11-07 MED ORDER — MIDAZOLAM HCL 5 MG/ML IJ SOLN
INTRAMUSCULAR | Status: AC
  Filled 2014-11-07: qty 1

## 2014-11-07 MED ORDER — SODIUM CHLORIDE 0.9 % IV SOLN
INTRAVENOUS | Status: DC
  Administered 2014-11-07 – 2014-11-09 (×5): via INTRAVENOUS

## 2014-11-07 NOTE — Op Note (Signed)
[facility] [place], [address]   FLEXIBLE SIGMOIDOSCOPY PROCEDURE REPORT  PATIENT: Damon, Goodwin  MR#: [id number] BIRTHDATE: 1945-06-29 , 20  yrs. old GENDER: male ENDOSCOPIST: Jerene Bears, MD REFERRED BY: [facility] PROCEDURE DATE:  11/07/2014 PROCEDURE:   Sigmoidoscopy, diagnostic ASA CLASS:   Class III INDICATIONS:rectal bleeding, history of cecal mass, history of rectal mass, status post subtotal colectomy with ileorectal anastomosis in April 2016, chronic anticoagulation. MEDICATIONS: Versed 3 mg IV and Fentanyl 37.5 mcg IV  DESCRIPTION OF PROCEDURE:   After the risks benefits and alternatives of the procedure were thoroughly explained, informed consent was obtained.  Digital exam revealed several skin tags. The therapeutic Pentax upper endoscope was introduced through the anus and advanced to the terminal ileum , without limitations.    The quality of the prep was  adequate. Estimated blood loss is zero unless otherwise noted in this procedure report. The instrument was then slowly withdrawn as the mucosa was fully examined.     COLON FINDINGS: There is evidence of a prior surgical anastomosis, ileorectal, with an intact staple line in the rectosigmoid colon. There is a small erosion at this area but it is healthy in appearance, not bleeding, and widely patent. There is a small blind pouch. The distal ileal mucosa is normal.   A half to three-quarter circumferential mass was found in the rectum.  This is located on the first rectal valve distal to the ileorectal anastomosis.  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 Retroflexion was not performed.  The scope was then withdrawn from the patient and the procedure terminated.  COMPLICATIONS: There were no immediate complications.  ENDOSCOPIC IMPRESSION: 1.     Ileorectal  anastomosis with very superficial erosion, healthy in appearance and widely patent 2.   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  Surgical consult for consideration of transanal resection versus further rectal resection 2.  Hold anticoagulation  eSigned:  Jerene Bears, MD 11/07/2014 12:18 PM   CC: the patient

## 2014-11-07 NOTE — Consult Note (Signed)
Reason for Consult:GI bleed Referring Physician: Dr. Tacy Goodwin is an 69 y.o. male.  HPI: Damon pt is a 69yo wm who presents [facility] to [facility] hospital after feeling dizzy and having a reddish brown stool. He is 4 weeks status post subtotal colectomy with ileorectal anastamosis for 2 rectal polyps that had high grade dysplasia. Damon second polyp was looked for but never found intraoperatively. He was re scoped today and there is a polyp identified on Damon first rectal valve that has some friable edge that is likely Damon source of Damon recent low grade bleed.   Past Medical History  Diagnosis Date  . Depression   . Hypomagnesemia   . Fall at nursing home 05/27/2013    "slipped on water; said I broke my right hip" (05/27/2013)  . Migraine     "used to have them bad; haven't had one in awhile" (05/27/2013)  . Gout attack     "not too long ago; in my right foot" (05/27/2013)  . Complete heart block     reason for pacemaker/notes 05/27/2013  . Atrial fibrillation     [name] 05/27/2013  . HLD (hyperlipidemia)   . GIB (gastrointestinal bleeding)   . BPH (benign prostatic hyperplasia)     Past Surgical History  Procedure Laterality Date  . Insert / replace / remove pacemaker    . Appendectomy    . Cholecystectomy    . Open reduction of hip Right 05/28/2013    Procedure: OPEN REDUCTION OF HIP;  Surgeon: Renette Butters, MD;  Location: [facility];  Service: Orthopedics;  Laterality: Right;  . Esophagogastroduodenoscopy (egd) with propofol N/A 09/26/2014    Procedure: ESOPHAGOGASTRODUODENOSCOPY (EGD) WITH PROPOFOL;  Surgeon: Jerene Bears, MD;  Location: [facility] ENDOSCOPY;  Service: Endoscopy;  Laterality: N/A;  . Colonoscopy N/A 10/01/2014    Procedure: COLONOSCOPY;  Surgeon: Jerene Bears, MD;  Location: [facility] ENDOSCOPY;  Service: Endoscopy;  Laterality: N/A;  . Partial colectomy N/A 10/05/2014    Procedure: SUBTOTAL COLECTOMY WITH ILEORECTAL ANASTOMOSIS;  Surgeon: Donnie Mesa, MD;  Location: [facility] OR;   Service: General;  Laterality: N/A;    Family History  Problem Relation Age of Onset  . Colon cancer Mother     Social History:  reports that he has quit smoking. His smoking use included Cigarettes. He has a 15.25 pack-year smoking history. He has quit using smokeless tobacco. His smokeless tobacco use included Snuff and Chew. He reports that he drinks alcohol. He reports that he does not use illicit drugs.  Allergies: No Known Allergies  Medications: I have reviewed Damon patient's current medications.  Results for orders placed or performed during Damon hospital encounter of 11/06/14 (from Damon past 48 hour(s))  Basic metabolic panel     Status: Abnormal   Collection Time: 11/06/14  9:35 AM  Result Value Ref Range   Sodium 136 135 - 145 mmol/L   Potassium 3.8 3.5 - 5.1 mmol/L   Chloride 106 101 - 111 mmol/L   CO2 21 (L) 22 - 32 mmol/L   Glucose, Bld 94 65 - 99 mg/dL   BUN 21 (H) 6 - 20 mg/dL   Creatinine, Ser 0.59 (L) 0.61 - 1.24 mg/dL   Calcium 8.6 (L) 8.9 - 10.3 mg/dL   GFR calc non Af Amer >60 >60 mL/min   GFR calc Af Amer >60 >60 mL/min    Comment: (NOTE) Damon eGFR has been calculated using Damon CKD EPI equation. This calculation has not been validated  in all clinical situations. eGFR's persistently <60 mL/min signify possible Chronic Kidney Disease.    Anion gap 9 5 - 15  CBC WITH DIFFERENTIAL     Status: Abnormal   Collection Time: 11/06/14  9:35 AM  Result Value Ref Range   WBC 5.9 4.0 - 10.5 K/uL   RBC 3.22 (L) 4.22 - 5.81 MIL/uL   Hemoglobin 9.9 (L) 13.0 - 17.0 g/dL   HCT 31.5 (L) 39.0 - 52.0 %   MCV 97.8 78.0 - 100.0 fL   MCH 30.7 26.0 - 34.0 pg   MCHC 31.4 30.0 - 36.0 g/dL   RDW 16.8 (H) 11.5 - 15.5 %   Platelets 143 (L) 150 - 400 K/uL   Neutrophils Relative % 47 43 - 77 %   Neutro Abs 2.8 1.7 - 7.7 K/uL   Lymphocytes Relative 45 12 - 46 %   Lymphs Abs 2.7 0.7 - 4.0 K/uL   Monocytes Relative 7 3 - 12 %   Monocytes Absolute 0.4 0.1 - 1.0 K/uL   Eosinophils  Relative 1 0 - 5 %   Eosinophils Absolute 0.0 0.0 - 0.7 K/uL   Basophils Relative 0 0 - 1 %   Basophils Absolute 0.0 0.0 - 0.1 K/uL  Protime-INR     Status: Abnormal   Collection Time: 11/06/14  9:35 AM  Result Value Ref Range   Prothrombin Time 18.5 (H) 11.6 - 15.2 seconds   INR 1.54 (H) 0.00 - 1.49  Type and screen     Status: None   Collection Time: 11/06/14  9:35 AM  Result Value Ref Range   ABO/RH(D) O POS    Antibody Screen NEG    Sample Expiration 11/09/2014   POC occult blood, ED RN will collect     Status: Abnormal   Collection Time: 11/06/14 10:46 AM  Result Value Ref Range   Fecal Occult Bld POSITIVE (A) NEGATIVE  Clostridium Difficile by PCR     Status: None   Collection Time: 11/06/14  9:40 PM  Result Value Ref Range   C difficile by pcr NEGATIVE NEGATIVE  Basic metabolic panel     Status: Abnormal   Collection Time: 11/07/14  2:28 AM  Result Value Ref Range   Sodium 139 135 - 145 mmol/L   Potassium 3.7 3.5 - 5.1 mmol/L   Chloride 108 101 - 111 mmol/L   CO2 26 22 - 32 mmol/L   Glucose, Bld 114 (H) 65 - 99 mg/dL   BUN 21 (H) 6 - 20 mg/dL   Creatinine, Ser 0.63 0.61 - 1.24 mg/dL   Calcium 8.1 (L) 8.9 - 10.3 mg/dL   GFR calc non Af Amer >60 >60 mL/min   GFR calc Af Amer >60 >60 mL/min    Comment: (NOTE) Damon eGFR has been calculated using Damon CKD EPI equation. This calculation has not been validated in all clinical situations. eGFR's persistently <60 mL/min signify possible Chronic Kidney Disease.    Anion gap 5 5 - 15  CBC     Status: Abnormal   Collection Time: 11/07/14  2:28 AM  Result Value Ref Range   WBC 4.6 4.0 - 10.5 K/uL   RBC 2.61 (L) 4.22 - 5.81 MIL/uL   Hemoglobin 8.2 (L) 13.0 - 17.0 g/dL   HCT 25.7 (L) 39.0 - 52.0 %   MCV 98.5 78.0 - 100.0 fL   MCH 31.4 26.0 - 34.0 pg   MCHC 31.9 30.0 - 36.0 g/dL   RDW 17.1 (H) 11.5 -  15.5 %   Platelets 125 (L) 150 - 400 K/uL  Urinalysis, Routine w reflex microscopic (not at [facility])     Status: None    Collection Time: 11/07/14  6:05 AM  Result Value Ref Range   Color, Urine YELLOW YELLOW   APPearance CLEAR CLEAR   Specific Gravity, Urine 1.024 1.005 - 1.030   pH 5.0 5.0 - 8.0   Glucose, UA NEGATIVE NEGATIVE mg/dL   Hgb urine dipstick NEGATIVE NEGATIVE   Bilirubin Urine NEGATIVE NEGATIVE   Ketones, ur NEGATIVE NEGATIVE mg/dL   Protein, ur NEGATIVE NEGATIVE mg/dL   Urobilinogen, UA 0.2 0.0 - 1.0 mg/dL   Nitrite NEGATIVE NEGATIVE   Leukocytes, UA NEGATIVE NEGATIVE    Comment: MICROSCOPIC NOT DONE ON URINES WITH NEGATIVE PROTEIN, BLOOD, LEUKOCYTES, NITRITE, OR GLUCOSE <1000 mg/dL.  Occult blood card to lab, stool     Status: Abnormal   Collection Time: 11/07/14  6:45 AM  Result Value Ref Range   Fecal Occult Bld POSITIVE (A) NEGATIVE  CBC     Status: Abnormal   Collection Time: 11/07/14  9:58 AM  Result Value Ref Range   WBC 5.2 4.0 - 10.5 K/uL   RBC 2.88 (L) 4.22 - 5.81 MIL/uL   Hemoglobin 8.9 (L) 13.0 - 17.0 g/dL   HCT 28.8 (L) 39.0 - 52.0 %   MCV 100.0 78.0 - 100.0 fL   MCH 30.9 26.0 - 34.0 pg   MCHC 30.9 30.0 - 36.0 g/dL   RDW 17.1 (H) 11.5 - 15.5 %   Platelets 128 (L) 150 - 400 K/uL    Ct Head Wo Contrast  11/06/2014   CLINICAL DATA:  Fall, on Xarelto  EXAM: CT HEAD WITHOUT CONTRAST  TECHNIQUE: Contiguous axial images were obtained from Damon base of Damon skull through Damon vertex without intravenous contrast.  COMPARISON:  10/27/2006  FINDINGS: Motion degraded images.  No evidence of parenchymal hemorrhage or extra-axial fluid collection. No mass lesion, mass effect, or midline shift.  No CT evidence of acute infarction.  Subcortical white matter and periventricular small vessel ischemic changes. Intracranial atherosclerosis.  Global cortical atrophy.  No ventriculomegaly.  Damon visualized paranasal sinuses are essentially clear. Damon mastoid air cells are unopacified.  No evidence of calvarial fracture.  IMPRESSION: No evidence of acute intracranial abnormality.  Atrophy with small  vessel ischemic changes.   Electronically Signed   By: Julian Hy M.D.   On: 11/06/2014 10:08   Dg Hip Unilat With Pelvis 2-3 Views Left  11/06/2014   CLINICAL DATA:  Dizziness and fall with left hip pain.  EXAM: LEFT HIP (WITH PELVIS) 2-3 VIEWS  COMPARISON:  05/28/2013 and CT 09/30/2014  FINDINGS: Examination demonstrates diffuse decreased bone mineralization. There are mild symmetric degenerative changes of Damon hips. Fixation hardware over Damon right femur is intact and unchanged. No evidence of acute fracture dislocation of Damon left hip/pelvis. There are degenerative changes of Damon spine as well as calcified plaque over Damon abdominal aorta and iliac arteries.  IMPRESSION: No acute findings.   Electronically Signed   By: Marin Olp M.D.   On: 11/06/2014 10:13    Review of Systems  Constitutional: Negative.   HENT: Negative.   Eyes: Negative.   Respiratory: Negative.   Cardiovascular: Negative.   Gastrointestinal: Positive for blood in stool. Negative for abdominal pain.  Genitourinary: Negative.   Musculoskeletal: Negative.   Skin: Negative.   Neurological: Positive for dizziness.  Endo/Heme/Allergies: Negative.   Psychiatric/Behavioral: Negative.    Blood  pressure 115/66, pulse 49, temperature 97.9 F (36.6 C), temperature source Oral, resp. rate 20, height '5\' 9"'  (1.753 m), weight 42.774 kg (94 lb 4.8 oz), SpO2 92 %. Physical Exam  Constitutional: He is oriented to person, place, and time. He appears well-developed and well-nourished.  HENT:  Head: Normocephalic and atraumatic.  Eyes: Conjunctivae and EOM are normal. Pupils are equal, round, and reactive to light.  Neck: Normal range of motion. Neck supple.  Cardiovascular: Normal rate, regular rhythm and normal heart sounds.   Respiratory: Effort normal and breath sounds normal.  GI: Soft. Bowel sounds are normal.  Musculoskeletal: Normal range of motion.  Neurological: He is alert and oriented to person, place, and time.   Skin: Skin is warm and dry.  Psychiatric: He has a normal mood and affect. His behavior is normal.    Assessment/Plan: Damon pt is currently asymptomatic but has a polyp in Damon rectum that has at least some high grade dysplasia. Unfortunately he is 4 weeks out from his last surgery. I would recommend holding his blood thinners. I will discuss with his primary surgeon his current findings. We will follow closely.  TOTH III,Tonjia Parillo S 11/07/2014, 1:18 PM

## 2014-11-07 NOTE — Consult Note (Signed)
Consultation  Referring Provider:  [facility]    Primary Care Physician:  [name], MD Primary Gastroenterologist:    Dr. Henrene Pastor (remotely). Most recently Dr. Hilarie Fredrickson (inpatient)    Reason for Consultation:  gib             HPI:   [name] is a 69 y.o. male who was hospitalized in late April with hematochezia. Inpatient colonoscopy revelaed multiple polyps , a 4cm cecal mass and a 4-5cm rectal mass. Cecal path was tubulovillous adenoma with high grade dysplasia and rectal mass biopsy c/w tubular adenoma with high grade dysplasia. Other polyps were adenomatous without dysplasia. On 10/05/14 patient underwent subtotal colectomy with ileorectal anastomosis.   Patient brought to ED after a fall at home.Patient was dizzy but denies loss of consciousness. He was heme positive in ED with mild drop in hgb. Patient now having rectal bleeding. He is on Xarelto but cannot remember if he took it yesterday. No significant abdominal pain.   Past Medical History  Diagnosis Date  . Depression   . Hypomagnesemia   . Fall at nursing home 05/27/2013    "slipped on water; said I broke my right hip" (05/27/2013)  . Migraine     "used to have them bad; haven't had one in awhile" (05/27/2013)  . Gout attack     "not too long ago; in my right foot" (05/27/2013)  . Complete heart block     reason for pacemaker/notes 05/27/2013  . Atrial fibrillation     [name] 05/27/2013  . HLD (hyperlipidemia)   . GIB (gastrointestinal bleeding)   . BPH (benign prostatic hyperplasia)     Past Surgical History  Procedure Laterality Date  . Insert / replace / remove pacemaker    . Appendectomy    . Cholecystectomy    . Open reduction of hip Right 05/28/2013    Procedure: OPEN REDUCTION OF HIP;  Surgeon: Renette Butters, MD;  Location: [facility];  Service: Orthopedics;  Laterality: Right;  . Esophagogastroduodenoscopy (egd) with propofol N/A 09/26/2014    Procedure: ESOPHAGOGASTRODUODENOSCOPY (EGD) WITH  PROPOFOL;  Surgeon: Jerene Bears, MD;  Location: [facility] ENDOSCOPY;  Service: Endoscopy;  Laterality: N/A;  . Colonoscopy N/A 10/01/2014    Procedure: COLONOSCOPY;  Surgeon: Jerene Bears, MD;  Location: [facility] ENDOSCOPY;  Service: Endoscopy;  Laterality: N/A;  . Partial colectomy N/A 10/05/2014    Procedure: SUBTOTAL COLECTOMY WITH ILEORECTAL ANASTOMOSIS;  Surgeon: Donnie Mesa, MD;  Location: [facility] OR;  Service: General;  Laterality: N/A;    Family History  Problem Relation Age of Onset  . Colon cancer Mother      History  Substance Use Topics  . Smoking status: Former Smoker -- 0.25 packs/day for 61 years    Types: Cigarettes  . Smokeless tobacco: Former Systems developer    Types: Snuff, Chew     Comment: 05/27/2013 "haven't used chew or snuff in ~ 30 yr"  . Alcohol Use: Yes     Comment: 05/27/2013 "none in years; never had problem w/it" - not in 30 years    Prior to Admission medications   Medication Sig Start Date End Date Taking? Authorizing Provider  aspirin 81 MG chewable tablet Chew 81 mg by mouth daily.   Yes Historical Provider, MD  Calcium Carbonate-Vit D-Min (CALTRATE 600+D PLUS MINERALS) 600-800 MG-UNIT TABS Take 1 tablet by mouth daily.   Yes Historical Provider, MD  carvedilol (COREG) 12.5 MG tablet Take 12.5 mg by mouth 2 (two)  times daily with a meal.   Yes Historical Provider, MD  docusate sodium (COLACE) 100 MG capsule Take 1 capsule (100 mg total) by mouth 2 (two) times daily. Continue this while taking narcotics to help with bowel movements 05/28/13  Yes Renette Butters, MD  finasteride (PROSCAR) 5 MG tablet Take 5 mg by mouth every morning.   Yes Historical Provider, MD  magnesium oxide (MAG-OX) 400 MG tablet Take 400 mg by mouth 2 (two) times daily.   Yes Historical Provider, MD  oxyCODONE-acetaminophen (PERCOCET/ROXICET) 5-325 MG per tablet Take 1 tablet by mouth every 4 (four) hours as needed for moderate pain. 10/17/14  Yes Domenic Polite, MD  pravastatin (PRAVACHOL) 10 MG tablet Take  10 mg by mouth at bedtime.   Yes Historical Provider, MD  rivaroxaban (XARELTO) 20 MG TABS tablet Take 1 tablet (20 mg total) by mouth every morning. Resume 4/19 09/27/14  Yes Shanker Kristeen Mans, MD  sertraline (ZOLOFT) 50 MG tablet Take 50 mg by mouth every morning.    Yes Historical Provider, MD  traMADol (ULTRAM) 50 MG tablet Take 1 tablet (50 mg total) by mouth every 6 (six) hours as needed. 09/26/14  Yes Shanker Kristeen Mans, MD  zolpidem (AMBIEN) 5 MG tablet Take 5 mg by mouth at bedtime.   Yes Historical Provider, MD  loperamide (IMODIUM) 2 MG capsule Take 1 capsule (2 mg total) by mouth as needed for diarrhea or loose stools (you may give 2mg  of imodium after each loose stool, up to 16 mg total). Patient not taking: Reported on 10/28/2014 10/17/14   Domenic Polite, MD    Current Facility-Administered Medications  Medication Dose Route Frequency Provider Last Rate Last Dose  . 0.9 %  sodium chloride infusion   Intravenous Continuous Belkys A Regalado, MD 100 mL/hr at 11/07/14 0804    . acetaminophen (TYLENOL) tablet 650 mg  650 mg Oral Q6H PRN Albertine Patricia, MD       Or  . acetaminophen (TYLENOL) suppository 650 mg  650 mg Rectal Q6H PRN Albertine Patricia, MD      . bisacodyl (DULCOLAX) suppository 10 mg  10 mg Rectal Daily PRN Albertine Patricia, MD      . calcium-vitamin D (OSCAL WITH D) 500-200 MG-UNIT per tablet 1 tablet  1 tablet Oral Q breakfast Dawood S Elgergawy, MD      . carvedilol (COREG) tablet 12.5 mg  12.5 mg Oral BID WC Silver Huguenin Elgergawy, MD   12.5 mg at 11/06/14 1707  . docusate sodium (COLACE) capsule 100 mg  100 mg Oral BID Albertine Patricia, MD   100 mg at 11/06/14 1315  . feeding supplement (ENSURE) (ENSURE) pudding 1 Container  1 Container Oral TID BM Albertine Patricia, MD   1 Container at 11/06/14 2000  . finasteride (PROSCAR) tablet 5 mg  5 mg Oral BH-q7a Albertine Patricia, MD   5 mg at 11/07/14 0617  . guaiFENesin-dextromethorphan (ROBITUSSIN DM) 100-10 MG/5ML  syrup 5 mL  5 mL Oral Q4H PRN Dianne Dun, NP   5 mL at 11/07/14 0406  . magnesium oxide (MAG-OX) tablet 400 mg  400 mg Oral BID Albertine Patricia, MD   400 mg at 11/06/14 2133  . nicotine (NICODERM CQ - dosed in mg/24 hours) patch 21 mg  21 mg Transdermal Daily Albertine Patricia, MD   21 mg at 11/06/14 1707  . ondansetron (ZOFRAN) tablet 4 mg  4 mg Oral Q6H PRN Silver Huguenin  Elgergawy, MD       Or  . ondansetron (ZOFRAN) injection 4 mg  4 mg Intravenous Q6H PRN Albertine Patricia, MD      . oxyCODONE-acetaminophen (PERCOCET/ROXICET) 5-325 MG per tablet 1 tablet  1 tablet Oral Q4H PRN Albertine Patricia, MD   1 tablet at 11/07/14 0406  . pantoprazole (PROTONIX) EC tablet 40 mg  40 mg Oral Daily Albertine Patricia, MD   40 mg at 11/06/14 1147  . polyethylene glycol (MIRALAX / GLYCOLAX) packet 17 g  17 g Oral Daily PRN Albertine Patricia, MD      . pravastatin (PRAVACHOL) tablet 10 mg  10 mg Oral QHS Albertine Patricia, MD   10 mg at 11/06/14 2133  . sertraline (ZOLOFT) tablet 50 mg  50 mg Oral q morning - 10a Albertine Patricia, MD   50 mg at 11/06/14 1315  . traMADol (ULTRAM) tablet 50 mg  50 mg Oral Q6H PRN Albertine Patricia, MD      . zolpidem (AMBIEN) tablet 5 mg  5 mg Oral QHS Albertine Patricia, MD   5 mg at 11/06/14 2133    Allergies as of 11/06/2014  . (No Known Allergies)    Review of Systems:    All systems reviewed and negative except where noted in HPI.   Physical Exam:  Vital signs in last 24 hours: Temp:  [97.8 F (36.6 C)-98.3 F (36.8 C)] 98.3 F (36.8 C) (05/30 0605) Pulse Rate:  [64-122] 122 (05/30 0605) Resp:  [16-20] 16 (05/29 2053) BP: (94-132)/(47-74) 99/49 mmHg (05/30 0605) SpO2:  [98 %-100 %] 100 % (05/29 2053) Weight:  [94 lb 4.8 oz (42.774 kg)] 94 lb 4.8 oz (42.774 kg) (05/29 1235) Last BM Date: 11/06/14 General:   Pleasant white male in NAD Head:  Normocephalic and atraumatic. Eyes:   No icterus.   Conjunctiva pink. Ears:  Normal auditory  acuity. Neck:  Supple; no masses felt Lungs:  Respirations even and unlabored. Lungs clear to auscultation bilaterally.   No wheezes, crackles, or rhonchi.  Heart:  Regular rate, irregular rhythm. Abdomen:  Soft, nondistended, nontender. Normal bowel sounds. No appreciable masses or hepatomegaly.   Msk:  Symmetrical without gross deformities.  Extremities:  Without edema. Neurologic:  Alert and  oriented x4;  grossly normal neurologically. Skin:  Intact without significant lesions or rashes. Cervical Nodes:  No significant cervical adenopathy. Psych:  Alert and cooperative.   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 SEE COMMENT. - CECAL TUBULAR ADENOMA (1.1CM) NEGATIVE FOR HIGH GRADE DYSPLASIA AND INVASIVE MALIGNANCY - RECTAL PREVIOUS BIOPSY SITE, NEGATIVE FOR DYSPLASIA OR MALIGNANCY. - OMENTAL SOFT TISSUE INVOLVEMENT BY LOW GRADE NON-HODGKIN'S B CELL LYMPHOMA, MARGINAL ZONE TYPE - TWENTY-SIX LYMPH NODES, NEGATIVE FOR METASTATIC TUMOR (0/26). - ENDOSCOPIC TATTOO INK IDENTIFIED. - COLORECTAL ULCERATIONS WITH ISCHEMIC INJURY AND KAYEXALATE DEPOSITS. - SURGICAL MARGINS, NEGATIVE FOR EPITHELIAL DYSPLASIA  OR MALIGNANCY. 2. Colon, resection margin (donut), distal - BENIGN COLON. - NEGATIVE FOR EPITHELIAL DYSPLASIA OR MALIGNANCY.    Impression / Plan:   64. 69 year old male with painless rectal bleeding. He is s/p subtotal colectomy with ileorectal anastomosis late April. Resection done for large  cecal and rectal mass. 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 Not clear on rectal path submitted after resection (no mention of the mass though surely it was resected?). Rule out anastomotic bleed. Patient needs flex sigmoidoscopy today. He has been NPO this am.   2. Afib, he has a pacemaker. On Xarelto.    Thanks     Tye Savoy  11/07/2014, 9:18 AM

## 2014-11-07 NOTE — Evaluation (Signed)
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 Pt is 4 weeks s/p subtotal colectomy with ileorectal anastamosis for 2 rectal polyps that had high grade dysplasia. Sigmoidoscopy shows polyp in the rectum that has at least some high grade dysplasia.    Clinical Impression  Patient presents with mild balance deficits impacting mobility. Reaching to hold onto rail vs IV pole during gait training for balance. Might benefit from [facility] vs RW for safety/stability during mobility. Pt with questionable safety awareness. Pt functioning close to baseline however would benefit from skilled PT to improve balance and safe mobility prior to return to ALF.    Follow Up Recommendations Supervision - Intermittent;Home health PT    Equipment Recommendations  None recommended by PT    Recommendations for Other Services       Precautions / Restrictions Precautions Precautions: Fall Restrictions Weight Bearing Restrictions: No      Mobility  Bed Mobility               General bed mobility comments: Sitting in chair upon PT arrival with sitter.  Transfers Overall transfer level: Needs assistance   Transfers: Sit to/from Stand Sit to Stand: Min guard         General transfer comment: Min guard to return to seated position. Assist for reaching back for surface.   Ambulation/Gait Ambulation/Gait assistance: Min guard Ambulation Distance (Feet): 150 Feet Assistive device: None Gait Pattern/deviations: Step-to pattern;Decreased stride length;Narrow base of support   Gait velocity interpretation: Below normal speed for age/gender General Gait Details: Pt with step to gait pattern, holding onto rail in  hallway or IV pole for support. Mildly unsteady. Declined using RW. Multiple standing rest breaks. 1/4 dyspnea.  Stairs            Wheelchair Mobility    Modified Rankin (Stroke Patients Only)       Balance Overall balance assessment: Needs assistance Sitting-balance support: Feet supported;No upper extremity supported Sitting balance-Leahy Scale: Fair     Standing balance support: During functional activity Standing balance-Leahy Scale: Fair                               Pertinent Vitals/Pain Pain Assessment: Faces Faces Pain Scale: Hurts little more Pain Location: LEs. Pain Descriptors / Indicators: Sore Pain Intervention(s): Monitored during session;Repositioned;Patient requesting pain meds-RN notified    Home Living Family/patient expects to be discharged to:: Assisted living               Home Equipment: Walker - 2 wheels;Wheelchair - manual;Shower seat      Prior Function Level of Independence: Independent with assistive device(s)         Comments: Pt reports he has lived at [facility] x 11 years and is mod I from a w/c level. Appears to be furniture walker based on assessment. Reports 1 fall recently.      Hand Dominance   Dominant Hand: Right    Extremity/Trunk Assessment   Upper Extremity Assessment: Defer to OT evaluation           Lower Extremity Assessment: Overall WFL for tasks assessed      Cervical / Trunk  Assessment: Normal  Communication   Communication: No difficulties  Cognition Arousal/Alertness: Awake/alert Behavior During Therapy: WFL for tasks assessed/performed;Impulsive Overall Cognitive Status: Within Functional Limits for tasks assessed Area of Impairment: Safety/judgement         Safety/Judgement: Decreased awareness of safety     General Comments: Pt impulsive with movement and forgetting IV pole and getting twisted in lines.    General Comments      Exercises        Assessment/Plan     PT Assessment Patient needs continued PT services  PT Diagnosis Abnormality of gait;Acute pain   PT Problem List Decreased strength;Pain;Cardiopulmonary status limiting activity;Decreased balance;Decreased mobility;Decreased safety awareness;Decreased activity tolerance  PT Treatment Interventions Gait training;Balance training;DME instruction;Functional mobility training;Therapeutic activities;Therapeutic exercise;Patient/family education;Wheelchair mobility training   PT Goals (Current goals can be found in the Care Plan section) Acute Rehab PT Goals Patient Stated Goal: to go back to ALF PT Goal Formulation: With patient Time For Goal Achievement: 11/21/14 Potential to Achieve Goals: Good    Frequency Min 3X/week   Barriers to discharge   Not sure of level of support at ALF.    Co-evaluation               End of Session Equipment Utilized During Treatment: Gait belt Activity Tolerance: Patient tolerated treatment well Patient left: in chair;with call bell/phone within reach;with nursing/sitter in room Nurse Communication: Mobility status;Patient requests pain meds    Functional Assessment Tool Used: Clinical judgment Functional Limitation: Mobility: Walking and moving around Mobility: Walking and Moving Around Current Status [phone]): At least 1 percent but less than 20 percent impaired, limited or restricted Mobility: Walking and Moving Around Goal Status [phone]): At least 1 percent but less than 20 percent impaired, limited or restricted    Time: 1411-1424 PT Time Calculation (min) (ACUTE ONLY): 13 min   Charges:   PT Evaluation $Initial PT Evaluation Tier I: 1 Procedure     PT G Codes:   PT G-Codes **NOT FOR INPATIENT CLASS** Functional Assessment Tool Used: Clinical judgment Functional Limitation: Mobility: Walking and moving around Mobility: Walking and Moving Around Current Status ([id number]): At least 1 percent but less than 20 percent impaired, limited or  restricted Mobility: Walking and Moving Around Goal Status [phone]): At least 1 percent but less than 20 percent impaired, limited or restricted    [place] 11/07/2014, 2:37 PM  Wray Kearns, [place], DPT [phone]

## 2014-11-07 NOTE — Progress Notes (Signed)
Pt has DNR papers in chart, but is listed as full code in epic. Please address code status.  Very frequent bowel movements. Stool went from brown green, to now brown/orange/red. Hemoccult sent, NP on call notified.  Patient refusing tele to be put back on him at this time.  Frequent IV removal. 2 last night.   Patient impulsive, very hard to redirect. Patient alert and oriented.

## 2014-11-07 NOTE — Care Management Note (Addendum)
Case Management Note  Patient Details  Name: [name] MRN: [id number] Date of Birth: [birth year]  Subjective/Objective:  69yo M who presents back to the hospital after feeling dizzy and having a reddish brown stool. He is 4 weeks status post subtotal colectomy with ileorectal anastamosis for 2 rectal polyps that had high grade dysplasia. The second polyp was looked for but never found intraoperatively. He was re-scoped and there is a polyp identified on the first rectal valve that has some friable edge that is likely the source of the recent low grade bleed and  has at least some high grade dysplasia         Action/Plan: received referral to assist with PCP   Expected Discharge Date: 11/09/14                  Expected Discharge Plan:  Assisted Living / Rest Home  In-House Referral:     Discharge planning Services  CM Consult  Post Acute Care Choice:    Choice offered to:     DME Arranged:    DME Agency:     HH Arranged:    [place] Agency:     Status of Service:  Completed, signed off  Medicare Important Message Given:    Date Medicare IM Given:    Medicare IM give by:    Date Additional Medicare IM Given:    Additional Medicare Important Message give by:     If discussed at [place] of Stay Meetings, dates discussed:    Additional Comments: met with to discuss PCP. He resides at [facility]. He stated that he has a doctor that sees him once a week. He doesn't know her name. Contacted facility at [phone], spoke with Amador Cunas, she stated that pt's PCP is Dr. [name] and his NP sees pt every Tuesdays.  Norina Buzzard, RN 11/07/2014, 1:58 PM

## 2014-11-07 NOTE — Progress Notes (Addendum)
[facility] PROGRESS NOTE  [name] UYQ:[id number] DOB: Apr 29, 1946 DOA: 11/06/2014 PCP: [name], MD  Assessment/Plan: 1-Near syncope:  -Suspect secondary to orthostatics, dehydration.  -Pacemaker interrogated, and function is normal.   2-GI bleed;  Per report patient had brown red stool.  Times 2 hemoccult positive.  IV fluids.  Continue to cycle Hb.  Type and scree in case needs transfusion.  Hb slightly decreasing 9.9 to 8.2.  GI consulted. Hold xarelto until ok to resume by GI  atrial fibrillation  - CHA2DS2-VASc Score is 2, Will hold Xarelto due to concern for GI bleed.  - Continue with Coreg for heart rate controlled   Protein calorie malnutrition  - Continue with ensure .  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  Family Communication: care discussed with patient.  Disposition Plan: remain inpatient for Observation of Hb, work up GI bleed.    Consultants:  Cardiology  GI  Procedures:  none  Antibiotics:  none  HPI/Subjective: Feeling ok.  Mild abdominal pain, chronic since the surgery.  Had red bloody stool today.  atient wants to be Full Code.   Objective: Filed Vitals:   11/07/14 0605  BP: 99/49  Pulse: 122  Temp: 98.3 F (36.8 C)  Resp:     Intake/Output Summary (Last 24 hours) at 11/07/14 0739 Last data filed at 11/07/14 6387  Gross per 24 hour  Intake 1886.25 ml  Output     75 ml  Net 1811.25 ml   Filed Weights   11/06/14 0910 11/06/14 1235  Weight: 43.092 kg (95 lb) 42.774 kg (94 lb 4.8 oz)    Exam:   General:  Alert in no distress.   Cardiovascular: S 1, S 2 RRR  Respiratory: CTA  Abdomen: BS present, soft, mild tender  Musculoskeletal:  no edema  Data Reviewed: Basic Metabolic Panel:  Recent Labs Lab 11/06/14 0935 11/07/14 0228  NA 136 139  K 3.8 3.7  CL 106 108  CO2 21* 26  GLUCOSE 94 114*  BUN 21* 21*  CREATININE 0.59* 0.63  CALCIUM 8.6* 8.1*   Liver Function Tests: No results for input(s): AST, ALT, ALKPHOS, BILITOT, PROT, ALBUMIN in the last 168 hours. No results for input(s): LIPASE, AMYLASE in the last 168 hours. No results for input(s): AMMONIA in the last 168 hours. CBC:  Recent Labs Lab 11/06/14 0935 11/07/14 0228  WBC 5.9 4.6  NEUTROABS 2.8  --   HGB 9.9* 8.2*  HCT 31.5* 25.7*  MCV 97.8 98.5  PLT 143* 125*   Cardiac Enzymes: No results for input(s): CKTOTAL, CKMB, CKMBINDEX, TROPONINI in the last 168 hours. BNP (last 3 results) No results for input(s): BNP in the last 8760 hours.  ProBNP (last 3 results) No results for input(s): PROBNP in the last 8760 hours.  CBG: No results for input(s): GLUCAP in the last 168 hours.  No results found for this or any previous visit (from the past 240 hour(s)).   Studies: Ct Head Wo Contrast  11/06/2014   CLINICAL DATA:  Fall, on Xarelto  EXAM: CT HEAD WITHOUT CONTRAST  TECHNIQUE: Contiguous axial images were obtained from the base of the skull through the vertex without intravenous contrast.  COMPARISON:  10/27/2006  FINDINGS: Motion degraded images.  No evidence of parenchymal hemorrhage or extra-axial fluid collection. No mass lesion, mass effect, or midline shift.  No CT evidence of acute infarction.  Subcortical white matter and periventricular small vessel ischemic changes. Intracranial atherosclerosis.  Global cortical atrophy.  No ventriculomegaly.  The visualized paranasal sinuses are essentially clear. The mastoid air cells are unopacified.  No evidence of calvarial fracture.  IMPRESSION: No evidence of acute intracranial abnormality.  Atrophy with small vessel ischemic changes.   Electronically Signed   By: Julian Hy M.D.   On:  11/06/2014 10:08   Dg Hip Unilat With Pelvis 2-3 Views Left  11/06/2014   CLINICAL DATA:  Dizziness and fall with left hip pain.  EXAM: LEFT HIP (WITH PELVIS) 2-3 VIEWS  COMPARISON:  05/28/2013 and CT 09/30/2014  FINDINGS: Examination demonstrates diffuse decreased bone mineralization. There are mild symmetric degenerative changes of the hips. Fixation hardware over the right femur is intact and unchanged. No evidence of acute fracture dislocation of the left hip/pelvis. There are degenerative changes of the spine as well as calcified plaque over the abdominal aorta and iliac arteries.  IMPRESSION: No acute findings.   Electronically Signed   By: Marin Olp M.D.   On: 11/06/2014 10:13    Scheduled Meds: . calcium-vitamin D  1 tablet Oral Q breakfast  . carvedilol  12.5 mg Oral BID WC  . docusate sodium  100 mg Oral BID  . feeding supplement (ENSURE)  1 Container Oral TID BM  . finasteride  5 mg Oral BH-q7a  . magnesium oxide  400 mg Oral BID  . nicotine  21 mg Transdermal Daily  . pantoprazole  40 mg Oral Daily  . pravastatin  10 mg Oral QHS  . rivaroxaban  20 mg Oral q morning - 10a  . sertraline  50 mg Oral q morning - 10a  . zolpidem  5 mg Oral QHS   Continuous Infusions: . sodium chloride 75 mL/hr at 11/06/14 2007    Active Problems:   Protein-calorie malnutrition   Atrial fibrillation   Sigmoid & cecal bleeding colon masses s/p abdominal colectomy 10/05/2014   Near syncope   Fall    Time spent: 35 minutes.     Niel Hummer A  [facility] Pager [phone]. If 7PM-7AM, please contact night-coverage at [email or web address], password [facility] 11/07/2014, 7:39 AM

## 2014-11-07 NOTE — Progress Notes (Signed)
PT Cancellation Note  Patient Details Name: [name] MRN: [id number] DOB: [birth year]   Cancelled Treatment:    Reason Eval/Treat Not Completed: Patient at procedure or test/unavailable  Pt off floor at endoscopy. Will follow up when time allows to perform PT evaluation.  Marguarite Arbour A Zalika Tieszen 11/07/2014, 11:04 AM  Wray Kearns, PT, DPT [phone]

## 2014-11-08 ENCOUNTER — Encounter (HOSPITAL_COMMUNITY): Payer: Self-pay | Admitting: Internal Medicine

## 2014-11-08 LAB — CBC
HCT: 27.5 % — ABNORMAL LOW (ref 39.0–52.0)
HCT: 31.7 % — ABNORMAL LOW (ref 39.0–52.0)
HEMATOCRIT: 25.4 % — AB (ref 39.0–52.0)
HEMOGLOBIN: 9.9 g/dL — AB (ref 13.0–17.0)
Hemoglobin: 7.9 g/dL — ABNORMAL LOW (ref 13.0–17.0)
Hemoglobin: 8.4 g/dL — ABNORMAL LOW (ref 13.0–17.0)
MCH: 30.1 pg (ref 26.0–34.0)
MCH: 31 pg (ref 26.0–34.0)
MCH: 31.3 pg (ref 26.0–34.0)
MCHC: 30.5 g/dL (ref 30.0–36.0)
MCHC: 31.1 g/dL (ref 30.0–36.0)
MCHC: 31.2 g/dL (ref 30.0–36.0)
MCV: 100.8 fL — ABNORMAL HIGH (ref 78.0–100.0)
MCV: 101.5 fL — ABNORMAL HIGH (ref 78.0–100.0)
MCV: 96.4 fL (ref 78.0–100.0)
Platelets: 120 10*3/uL — ABNORMAL LOW (ref 150–400)
Platelets: 125 10*3/uL — ABNORMAL LOW (ref 150–400)
Platelets: 125 10*3/uL — ABNORMAL LOW (ref 150–400)
RBC: 2.52 MIL/uL — ABNORMAL LOW (ref 4.22–5.81)
RBC: 2.71 MIL/uL — AB (ref 4.22–5.81)
RBC: 3.29 MIL/uL — AB (ref 4.22–5.81)
RDW: 16.9 % — ABNORMAL HIGH (ref 11.5–15.5)
RDW: 17 % — ABNORMAL HIGH (ref 11.5–15.5)
RDW: 19.3 % — ABNORMAL HIGH (ref 11.5–15.5)
WBC: 5.1 10*3/uL (ref 4.0–10.5)
WBC: 5.1 10*3/uL (ref 4.0–10.5)
WBC: 5.7 10*3/uL (ref 4.0–10.5)

## 2014-11-08 LAB — BASIC METABOLIC PANEL
Anion gap: 4 — ABNORMAL LOW (ref 5–15)
BUN: 12 mg/dL (ref 6–20)
CO2: 22 mmol/L (ref 22–32)
CREATININE: 0.54 mg/dL — AB (ref 0.61–1.24)
Calcium: 7.9 mg/dL — ABNORMAL LOW (ref 8.9–10.3)
Chloride: 111 mmol/L (ref 101–111)
GFR calc non Af Amer: >60 mL/min (ref >60)
Glucose, Bld: 121 mg/dL — ABNORMAL HIGH (ref 65–99)
Potassium: 4.2 mmol/L (ref 3.5–5.1)
Sodium: 137 mmol/L (ref 135–145)

## 2014-11-08 LAB — HEMOGLOBIN A1C
Hgb A1c MFr Bld: 5.6 % (ref 4.8–5.6)
Mean Plasma Glucose: 114 mg/dL

## 2014-11-08 LAB — PREPARE RBC (CROSSMATCH)

## 2014-11-08 MED ORDER — ALBUTEROL SULFATE (2.5 MG/3ML) 0.083% IN NEBU: 2.5000 mg | INHALATION_SOLUTION | Freq: Four times a day (QID) | RESPIRATORY_TRACT | Status: DC | PRN

## 2014-11-08 MED ORDER — ALBUTEROL SULFATE (2.5 MG/3ML) 0.083% IN NEBU
2.5000 mg | INHALATION_SOLUTION | Freq: Four times a day (QID) | RESPIRATORY_TRACT | Status: DC
  Administered 2014-11-08 (×2): 2.5 mg via RESPIRATORY_TRACT
  Filled 2014-11-08 (×2): qty 3

## 2014-11-08 MED ORDER — SODIUM CHLORIDE 0.9 % IV SOLN
Freq: Once | INTRAVENOUS | Status: AC
  Administered 2014-11-08: 08:00:00 via INTRAVENOUS

## 2014-11-08 MED ORDER — DIPHENHYDRAMINE HCL 25 MG PO CAPS
25.0000 mg | ORAL_CAPSULE | Freq: Four times a day (QID) | ORAL | Status: DC | PRN
  Administered 2014-11-08: 25 mg via ORAL
  Filled 2014-11-08: qty 1

## 2014-11-08 NOTE — Progress Notes (Signed)
Patient ID: [name], male   DOB: 1946/05/04, 69 y.o.   MRN: [id number] 1 Day Post-Op  Subjective: Pt feels well this morning.  Minimal abdominal soreness.  Had a small amount of bloody mixed with stool last night, but none this morning.  Objective: Vital signs in last 24 hours: Temp:  [97.7 F (36.5 C)-98.7 F (37.1 C)] 98 F (36.7 C) (05/31 0744) Pulse Rate:  [37-89] 55 (05/31 0756) Resp:  [14-24] 16 (05/31 0744) BP: (80-127)/(39-66) 110/45 mmHg (05/31 0756) SpO2:  [82 %-100 %] 97 % (05/31 0744) Weight:  [47.582 kg (104 lb 14.4 oz)] 47.582 kg (104 lb 14.4 oz) (05/31 0519) Last BM Date: 11/08/14  Intake/Output from previous day: 05/30 0701 - 05/31 0700 In: 1688 [P.O.:1688] Out: 750 [Urine:750] Intake/Output this shift: Total I/O In: 260 [P.O.:260] Out: -   PE: Abd: soft, NT, Nd, +BS  Lab Results:   Recent Labs  11/08/14 0010 11/08/14 0855  WBC 5.1 5.1  HGB 7.9* 8.4*  HCT 25.4* 27.5*  PLT 120* 125*   BMET  Recent Labs  11/07/14 0228 11/08/14 0010  NA 139 137  K 3.7 4.2  CL 108 111  CO2 26 22  GLUCOSE 114* 121*  BUN 21* 12  CREATININE 0.63 0.54*  CALCIUM 8.1* 7.9*   PT/INR  Recent Labs  11/06/14 0935  LABPROT 18.5*  INR 1.54*   CMP     Component Value Date/Time   NA 137 11/08/2014 0010   NA 140 10/28/2014 1237   K 4.2 11/08/2014 0010   K 3.7 10/28/2014 1237   CL 111 11/08/2014 0010   CO2 22 11/08/2014 0010   CO2 24 10/28/2014 1237   GLUCOSE 121* 11/08/2014 0010   GLUCOSE 91 10/28/2014 1237   BUN 12 11/08/2014 0010   BUN 17.4 10/28/2014 1237   CREATININE 0.54* 11/08/2014 0010   CREATININE 0.7 10/28/2014 1237   CALCIUM 7.9* 11/08/2014 0010   CALCIUM 8.8 10/28/2014 1237   PROT 6.9 10/28/2014 1237   PROT 5.2* 10/12/2014 0334   ALBUMIN 3.3* 10/28/2014 1237   ALBUMIN 1.8* 10/12/2014 0334   AST 14 10/28/2014 1237   AST 34 10/12/2014 0334   ALT 11 10/28/2014 1237   ALT 36 10/12/2014 0334   ALKPHOS 97 10/28/2014 1237   ALKPHOS 70  10/12/2014 0334   BILITOT 0.27 10/28/2014 1237   BILITOT 0.5 10/12/2014 0334   GFRNONAA >60 11/08/2014 0010   GFRAA >60 11/08/2014 0010   Lipase     Component Value Date/Time   LIPASE 40 09/30/2014 0253       Studies/Results: No results found.  Anti-infectives: Anti-infectives    None       Assessment/Plan LGI bleed,  S/p subtotal abdominal colectomy for multiple polyps for high grade dysplasia 10/05/2014 -patient just had colonoscopy that reveals another polyp that is friable and the source of bleeding in his rectum.  This is more distal that our anastomosis.  Will need to discuss with surgeon regarding plan.  He is only 4 weeks out and so another operation would not be recommended for at least 2 more months as his abdomen is likely frozen right now.  He is on blood thinners which make him more prone to bleed.  These may need to be held, but will d/w MD. -cont diet   LOS: 1 day    Annabella Elford E 11/08/2014, 10:36 AM Pager: [phone]

## 2014-11-08 NOTE — Progress Notes (Signed)
Paged Baltazar Najjar NP with Hgb of 7.9. Awaiting orders.

## 2014-11-08 NOTE — Progress Notes (Signed)
[facility] PROGRESS NOTE  [name] BSW:[id number] DOB: 08/19/1945 DOA: 11/06/2014 PCP: [name], MD  Assessment/Plan: 69 yo with history of A fib on xarelto, diagnosed with Cecal and rectal mass S/p subtotal colectomy with ileorectal anastomosis 4-27, present with near syncope episode, recurrent GI bleed. He underwent sigmoidoscopy which showed:  Ileorectal anastomosis with very superficial erosion, healthy in appearance and widely patent. Rectal mass as described above with friable surfaces, previously biopsied found to be tubular adenoma with at least high-grade dysplasia.  Surgery consulted, awaiting recommendation regarding next step for rectal mass.  Patient Hb has decrease to 7.9, he had another episode of bloody stool last night. I will transfuse 1 unit PRBC today. Xarelto has been on hold due to GI bleed.   1-Near syncope:  -Suspect secondary to orthostatics, dehydration, anemia.  -Pacemaker interrogated, and function is normal.  -resolved.   2-GI bleed;  Cecal and rectal mass S/p subtotal colectomy with ileorectal anastomosis 4-27 -Times 2 hemoccult positive. Bloody stool/  -Hb trending down. Will transfuse one unit PRBC today./  -GI consulted. Hold xarelto until ok to resume by GI, surgery/  -Patient underwent sigmoidoscopy which showed:  Ileorectal anastomosis with very superficial erosion, healthy in appearance and widely patent. Rectal mass as described above with friable surfaces, previously biopsied found to be tubular adenoma with at least high-grade dysplasia -Will follow surgery recommendation regarding rectal mass, polyp.   atrial fibrillation  - CHA2DS2-VASc Score is 2,  Holding  Xarelto due GI bleed.  - Continue with Coreg for heart rate controlled   Protein calorie malnutrition  - Continue with ensure .  BPH - Continue with Proscar  Hyperlipidemia - Continue with statin  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,  marginal zone - Continue to follow with oncology as an outpatient       Code Status: Full Code.  Family Communication: care discussed with patient.  Disposition Plan: remain inpatient for Observation of Hb, work up GI bleed.    Consultants:  Cardiology  GI  surgery  Procedures: Sigmoidoscopy: 1. Ileorectal anastomosis with very superficial erosion, healthy in appearance and widely patent 2. Rectal mass as described above with friable surfaces, previously biopsied found to be tubular adenoma with at least high-grade dysplasia   Antibiotics:  none  HPI/Subjective: Last bloody stool BM was last night.  Feeling well. No more lightheadedness. .    Objective: Filed Vitals:   11/08/14 1204  BP: 116/69  Pulse: 83  Temp: 98.2 F (36.8 C)  Resp: 20    Intake/Output Summary (Last 24 hours) at 11/08/14 1224 Last data filed at 11/08/14 1217  Gross per 24 hour  Intake   2518 ml  Output    553 ml  Net   1965 ml   Filed Weights   11/06/14 0910 11/06/14 1235 11/08/14 0519  Weight: 43.092 kg (95 lb) 42.774 kg (94 lb 4.8 oz) 47.582 kg (104 lb 14.4 oz)    Exam:   General:  Alert in no distress.   Cardiovascular: S 1, S 2 RRR  Respiratory: CTA  Abdomen: BS present, soft, mild tender  Musculoskeletal: no edema  Data Reviewed: Basic Metabolic Panel:  Recent Labs Lab 11/06/14 0935 11/07/14 0228 11/08/14 0010  NA 136 139 137  K 3.8 3.7 4.2  CL 106 108 111  CO2 21* 26 22  GLUCOSE 94 114* 121*  BUN 21* 21* 12  CREATININE 0.59* 0.63 0.54*  CALCIUM 8.6* 8.1* 7.9*  Liver Function Tests: No results for input(s): AST, ALT, ALKPHOS, BILITOT, PROT, ALBUMIN in the last 168 hours. No results for input(s): LIPASE, AMYLASE in the last 168 hours. No results for input(s): AMMONIA in the last 168 hours. CBC:  Recent Labs Lab 11/06/14 0935 11/07/14 0228 11/07/14 0958 11/07/14 1615 11/08/14 0010 11/08/14 0855  WBC 5.9 4.6 5.2 5.5 5.1 5.1  NEUTROABS  2.8  --   --   --   --   --   HGB 9.9* 8.2* 8.9* 8.7* 7.9* 8.4*  HCT 31.5* 25.7* 28.8* 27.9* 25.4* 27.5*  MCV 97.8 98.5 100.0 100.0 100.8* 101.5*  PLT 143* 125* 128* 135* 120* 125*   Cardiac Enzymes: No results for input(s): CKTOTAL, CKMB, CKMBINDEX, TROPONINI in the last 168 hours. BNP (last 3 results) No results for input(s): BNP in the last 8760 hours.  ProBNP (last 3 results) No results for input(s): PROBNP in the last 8760 hours.  CBG: No results for input(s): GLUCAP in the last 168 hours.  Recent Results (from the past 240 hour(s))  Clostridium Difficile by PCR     Status: None   Collection Time: 11/06/14  9:40 PM  Result Value Ref Range Status   C difficile by pcr NEGATIVE NEGATIVE Final     Studies: No results found.  Scheduled Meds: . albuterol  2.5 mg Nebulization Q6H  . calcium-vitamin D  1 tablet Oral Q breakfast  . carvedilol  12.5 mg Oral BID WC  . docusate sodium  100 mg Oral BID  . feeding supplement (ENSURE)  1 Container Oral TID BM  . finasteride  5 mg Oral BH-q7a  . magnesium oxide  400 mg Oral BID  . nicotine  21 mg Transdermal Daily  . pantoprazole  40 mg Oral Daily  . pravastatin  10 mg Oral QHS  . sertraline  50 mg Oral q morning - 10a  . zolpidem  5 mg Oral QHS   Continuous Infusions: . sodium chloride 100 mL/hr at 11/08/14 0814    Active Problems:   Protein-calorie malnutrition   Atrial fibrillation   Sigmoid & cecal bleeding colon masses s/p abdominal colectomy 10/05/2014   Near syncope   Fall   Chronic anticoagulation   Hx of colectomy   History of adenomatous polyp of colon   Rectal mass    Time spent: 35 minutes.     Niel Hummer A  [facility] Pager [phone]. If 7PM-7AM, please contact night-coverage at [email or web address], password [facility] 11/08/2014, 12:24 PM  LOS: 1 day

## 2014-11-09 ENCOUNTER — Inpatient Hospital Stay (HOSPITAL_COMMUNITY): Payer: Medicare Other

## 2014-11-09 DIAGNOSIS — M79609 Pain in unspecified limb: Secondary | ICD-10-CM

## 2014-11-09 DIAGNOSIS — R55 Syncope and collapse: Secondary | ICD-10-CM

## 2014-11-09 DIAGNOSIS — K922 Gastrointestinal hemorrhage, unspecified: Secondary | ICD-10-CM

## 2014-11-09 DIAGNOSIS — D62 Acute posthemorrhagic anemia: Secondary | ICD-10-CM

## 2014-11-09 DIAGNOSIS — M7989 Other specified soft tissue disorders: Secondary | ICD-10-CM

## 2014-11-09 LAB — CBC
HCT: 28.6 % — ABNORMAL LOW (ref 39.0–52.0)
HCT: 31 % — ABNORMAL LOW (ref 39.0–52.0)
HEMOGLOBIN: 9.1 g/dL — AB (ref 13.0–17.0)
Hemoglobin: 9.7 g/dL — ABNORMAL LOW (ref 13.0–17.0)
MCH: 30.2 pg (ref 26.0–34.0)
MCH: 30.7 pg (ref 26.0–34.0)
MCHC: 31.8 g/dL (ref 30.0–36.0)
MCHC: 31.3 g/dL (ref 30.0–36.0)
MCV: 96.6 fL (ref 78.0–100.0)
MCV: 96.6 fL (ref 78.0–100.0)
PLATELETS: 114 10*3/uL — AB (ref 150–400)
Platelets: 108 10*3/uL — ABNORMAL LOW (ref 150–400)
RBC: 2.96 MIL/uL — ABNORMAL LOW (ref 4.22–5.81)
RBC: 3.21 MIL/uL — ABNORMAL LOW (ref 4.22–5.81)
RDW: 19.6 % — AB (ref 11.5–15.5)
RDW: 19.7 % — ABNORMAL HIGH (ref 11.5–15.5)
WBC: 4.8 10*3/uL (ref 4.0–10.5)
WBC: 5.8 10*3/uL (ref 4.0–10.5)

## 2014-11-09 LAB — BASIC METABOLIC PANEL
ANION GAP: 8 (ref 5–15)
BUN: 7 mg/dL (ref 6–20)
CHLORIDE: 105 mmol/L (ref 101–111)
CO2: 23 mmol/L (ref 22–32)
Calcium: 8.6 mg/dL — ABNORMAL LOW (ref 8.9–10.3)
Creatinine, Ser: 0.57 mg/dL — ABNORMAL LOW (ref 0.61–1.24)
GFR calc non Af Amer: >60 mL/min (ref >60)
Glucose, Bld: 116 mg/dL — ABNORMAL HIGH (ref 65–99)
POTASSIUM: 4.7 mmol/L (ref 3.5–5.1)
Sodium: 136 mmol/L (ref 135–145)

## 2014-11-09 LAB — TYPE AND SCREEN
ABO/RH(D): O POS
Antibody Screen: NEGATIVE
Unit division: 0

## 2014-11-09 MED ORDER — CALCIUM POLYCARBOPHIL 625 MG PO TABS: 625.0000 mg | ORAL_TABLET | Freq: Every day | ORAL | Status: DC

## 2014-11-09 MED ORDER — CALCIUM POLYCARBOPHIL 625 MG PO TABS
625.0000 mg | ORAL_TABLET | Freq: Every day | ORAL | Status: DC
  Administered 2014-11-09: 625 mg via ORAL
  Filled 2014-11-09: qty 1

## 2014-11-09 MED ORDER — TRAMADOL HCL 50 MG PO TABS: 50.0000 mg | ORAL_TABLET | Freq: Four times a day (QID) | ORAL | Status: DC | PRN

## 2014-11-09 MED ORDER — ENSURE PUDDING PO PUDG: 1.0000 | Freq: Three times a day (TID) | ORAL | Status: DC

## 2014-11-09 MED ORDER — NICOTINE 21 MG/24HR TD PT24: 21.0000 mg | MEDICATED_PATCH | Freq: Every day | TRANSDERMAL | Status: DC

## 2014-11-09 MED ORDER — LOPERAMIDE HCL 2 MG PO CAPS: 4.0000 mg | ORAL_CAPSULE | Freq: Four times a day (QID) | ORAL | Status: DC | PRN

## 2014-11-09 NOTE — Clinical Social Work Note (Signed)
Patient is projected to discharge back to ALF, [facility] today.  [facility] is requesting DC summary by 1pm to review for admission.  Per ALF, patient was set up with [facility] for [facility] PT.  ALF requesting RNCM to communicate [facility] PT needs with [facility] prior to discharge.  RNCM made aware and agreeable.  CSW paged MD to make aware of need for discharge summary.  CSW continuing to follow.  Nonnie Done, LCSW [phone]  Psychiatric & Orthopedics (5N 1-8) Clinical Social Worker

## 2014-11-09 NOTE — Discharge Instructions (Signed)
Fiber supplement daily to help with diarrhea Imodium, up to 16mg  a day as needed to help control diarrhea

## 2014-11-09 NOTE — Progress Notes (Signed)
Preliminary results by tech - Left lower ext venous Duplex Completed. Negative for deep and superficial vein thrombosis.  Oda Cogan, BS, RDMS, RVT

## 2014-11-09 NOTE — Care Management Note (Signed)
Case Management Note  Patient Details  Name: [name] MRN: [id number] Date of Birth: 1946/05/12  Subjective/Objective:   Pt admitted for syncope and recurrent GI Bleeds. Pt is from ALF. Plan to return once stable.                  Action/Plan: CM did call [facility]- pt currently active. Agency will need orders for services and F2F. No further needs from CM at this time.    Expected Discharge Date:  11/09/14               Expected Discharge Plan:  Assisted Living / Rest Home  In-House Referral:     Discharge planning Services  CM Consult  Post Acute Care Choice:    Choice offered to:     DME Arranged:    DME Agency:     HH Arranged:  PT HH Agency:  [facility]  Status of Service:  Completed, signed off  Medicare Important Message Given:  N/A - LOS <3 / Initial given by admissions Date Medicare IM Given:    Medicare IM give by:    Date Additional Medicare IM Given:    Additional Medicare Important Message give by:     If discussed at [place] of Stay Meetings, dates discussed:    Additional Comments:  Bethena Roys, RN 11/09/2014, 11:36 AM

## 2014-11-09 NOTE — Discharge Summary (Addendum)
Physician Discharge Summary  [name] GYI:[id number] DOB: 09/24/1945 DOA: 11/06/2014  PCP: [name], MD  Admit date: 11/06/2014 Discharge date: 11/09/2014  Time spent: Greater than 30 minutes  Recommendations for Outpatient Follow-up:  1. MD at ALF in 3 days with repeat labs (CBC & BMP). 2. Home health physical therapy 3. Dr. Donnie Mesa, [facility]: ALF to call for appointment. 4. Dr. Cristopher Peru, Cardiology: ALF to arrange for follow-up appointment 5. 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  Filed Weights   11/06/14 1235 11/08/14 0519 11/09/14 0602  Weight: 42.774 kg (94 lb 4.8 oz) 47.582 kg (104 lb 14.4 oz) 48.127 kg (106 lb 1.6 oz)    History of present illness:  69 year old male patient who is 4 weeks status post subtotal colectomy with ileorectal anastomosis for 2 rectal polyps that had high-grade dysplasia. The second polyp was looked for but never found intraoperatively. His surgery was complicated by respiratory failure requiring intubation. Incidental omental biopsy showed a marginal B cell lymphoma and bone marrow biopsy was recommended for disease burden assessment. He is a resident of assisted living facility. He also has past medical history of depression, hypomagnesemia, gout, complete heart block status post pacemaker, A. fib on Xarelto, HLD and BPH. He was admitted to [facility] on 11/06/14 with complaints of rectal bleeding, dizziness  and near syncope. He was lost to follow-up with cardiology (Dr. Cristopher Peru)  Hospital Course:   Acute lower GI bleed secondary to rectal mass - [facility] and general surgery were consulted - Patient underwent flexible sigmoidoscopy on 11/07/14 with findings as outlined below. Most importantly showed rectal mass which is most likely the cause of his bleeding. - Aspirin and anticoagulation were discontinued due to ongoing bleeding - Surgeons have seen patient today (discussed with Mrs. Saverio Danker, PA) who have cleared patient for discharge back to ALF and patient is to follow-up with Dr. Georgette Dover as outpatient for possible transanal approach for polyp resection. - Surgeons recommend Imodium prn and fiber supplement to help with diarrhea. They also recommend holding anticoagulation until definitive treatment of source of rectal bleeding. - Patient is expected to when continues to have post colectomy diarrhea. -C. difficile PCR negative   Acute posthemorrhagic anemia complicating chronic anemia - Patient had hemoglobins in the low 10 g per DL range in early May. - He was admitted with hemoglobin of 9.9 which was probably hemoconcentrated - Hemoglobin gradually dropped to 7.948 hours after admission. - He was transfused a unit of packed red blood cells and hemoglobin has remained stable in the 9 g range. -  patient also has mild thrombocytopenia which appears chronic   Near syncope - Likely secondary to dehydration from post colectomy diarrhea, acute blood loss anemia and related orthostatic hypotension - Cardiology was consulted, pacemaker was interrogated and normal - Patient was gently hydrated and PRBCs transfused. - Patient denies dizziness, lightheadedness, chest pain or dyspnea. - CT head without acute findings  - Orthostatic blood pressure checks today: Negative  atrial fibrillation  - CHA2DS2-VASc Score is 2 - Holding Xarelto due GI bleed. Aspirin discontinued too. - Continue  with Coreg for heart rate controlled   Protein calorie malnutrition - Continue ensure  BPH - Continue Proscar  Hyperlipidemia - Continue statins  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, marginal zone - Recommend outpatient oncology consultation and follow-up  History of gout - Mild left ankle and feet edema without acute findings. Left lower extremity venous Dopplers negative for DVT. Findings not consistent with acute gout flare.  Consultations:  Cardiology   Surgery  [facility]  Procedures:  Flexible sigmoidoscopy 11/07/14 ENDOSCOPIC IMPRESSION: 1. Ileorectal anastomosis with very superficial erosion, healthy in appearance and widely patent 2. 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 Surgical consult for consideration of transanal resection versus further rectal resection  2. Hold anticoagulation    Discharge Exam:  Complaints:  Ongoing diarrhea but denies rectal bleeding. Denies dizziness, lightheadedness, chest pain or dyspnea. Some left ankle swelling without significant pain.   Filed Vitals:   11/09/14 0602 11/09/14 0800 11/09/14 0837 11/09/14 1146  BP:  140/70  137/77  Pulse:  48 73 83  Temp:  98.6 F (37 C)  97.2 F (36.2 C)  TempSrc:  Oral  Oral  Resp:  18  18  Height:      Weight: 48.127 kg (106 lb 1.6 oz)     SpO2:  98%  98%    General exam: Small built and frail middle-aged male sitting up comfortably at edge of bed. Respiratory system: Clear. No increased work of breathing. Cardiovascular system: S1 & S2 heard, RRR. No JVD, murmurs, gallops, click. Trace left ankle edema . telemetry: Atrial flutter with controlled ventricular rate, occasional PVCs and intermittent on demand ventricular pacing.  Gastrointestinal system: Abdomen is nondistended, soft and nontender. Normal bowel  sounds heard. Central nervous system: Alert and oriented. No focal neurological deficits. Extremities: Symmetric 5 x 5 power.  Discharge Instructions      Discharge Instructions    Call MD for:  difficulty breathing, headache or visual disturbances    Complete by:  As directed      Call MD for:  extreme fatigue    Complete by:  As directed      Call MD for:  persistant dizziness or light-headedness    Complete by:  As directed      Call MD for:  persistant nausea and vomiting    Complete by:  As directed      Call MD for:  severe uncontrolled pain    Complete by:  As directed      Call MD for:  temperature >100.4    Complete by:  As directed      Call MD for:    Complete by:  As directed   Worsening Rectal Bleeding.     Diet - low sodium heart healthy    Complete by:  As directed      Increase activity slowly    Complete by:  As directed             Medication List    STOP taking these medications        aspirin 81 MG chewable tablet     docusate sodium 100 MG capsule  Commonly known as:  COLACE     oxyCODONE-acetaminophen 5-325 MG per tablet  Commonly known as:  PERCOCET/ROXICET     rivaroxaban 20 MG Tabs tablet  Commonly known as:  XARELTO      TAKE these medications        CALTRATE 600+D PLUS MINERALS 600-800 MG-UNIT Tabs  Take 1 tablet by mouth daily.     carvedilol 12.5 MG tablet  Commonly known as:  COREG  Take 12.5 mg by mouth 2 (two) times daily with a meal.     feeding supplement (ENSURE) Pudg  Take 1 Container by mouth 3 (three) times daily between meals.     finasteride 5 MG tablet  Commonly known as:  PROSCAR  Take 5 mg by mouth every morning.     loperamide 2 MG capsule  Commonly known as:  IMODIUM  Take 1 capsule (2 mg total) by mouth as needed for diarrhea or loose stools (you may give 31m of imodium after each loose stool, up to 16 mg total).     magnesium oxide 400 MG tablet  Commonly known as:  MAG-OX  Take 400 mg by mouth 2  (two) times daily.     nicotine 21 mg/24hr patch  Commonly known as:  NICODERM CQ - dosed in mg/24 hours  Place 1 patch (21 mg total) onto the skin daily.     polycarbophil 625 MG tablet  Commonly known as:  FIBERCON  Take 1 tablet (625 mg total) by mouth daily.     pravastatin 10 MG tablet  Commonly known as:  PRAVACHOL  Take 10 mg by mouth at bedtime.     sertraline 50 MG tablet  Commonly known as:  ZOLOFT  Take 50 mg by mouth every morning.     traMADol 50 MG tablet  Commonly known as:  ULTRAM  Take 1 tablet (50 mg total) by mouth every 6 (six) hours as needed for moderate pain or severe pain.     zolpidem 5 MG tablet  Commonly known as:  AMBIEN  Take 5 mg by mouth at bedtime.       Follow-up Information    Follow up with TMaia Petties, MD On 11/22/2014.   Specialty:  General Surgery   Why:  arrive at 2:10pm for a 2:40pm appointment   Contact information:   [phone]      Follow up with [facility]   Specialty:  Home Health Services   Why:  Physical / Occupational Therapy   Contact information:   [address] [phone]       The results of significant diagnostics from this hospitalization (including imaging, microbiology, ancillary and laboratory) are listed below for reference.    Significant Diagnostic Studies: Ct Head Wo Contrast  11/06/2014   CLINICAL DATA:  Fall, on Xarelto  EXAM: CT HEAD WITHOUT CONTRAST  TECHNIQUE: Contiguous axial images were obtained from the base of the skull through the vertex without intravenous contrast.  COMPARISON:  10/27/2006  FINDINGS: Motion degraded images.  No evidence of parenchymal hemorrhage or extra-axial fluid collection. No mass lesion, mass effect, or midline shift.  No CT evidence of acute infarction.  Subcortical white matter and periventricular small vessel ischemic changes. Intracranial atherosclerosis.  Global cortical  atrophy.  No ventriculomegaly.  The visualized paranasal sinuses are essentially clear. The mastoid air cells are unopacified.  No evidence of calvarial fracture.  IMPRESSION: No evidence of acute intracranial abnormality.  Atrophy with small vessel ischemic changes.   Electronically Signed   By:  Julian Hy M.D.   On: 11/06/2014 10:08   Dg Chest Port 1 View  10/13/2014   CLINICAL DATA:  Rectal mass.  EXAM: PORTABLE CHEST - 1 VIEW  COMPARISON:  10/09/2014  FINDINGS: The nasogastric tube has been removed. There are intact appearances of the transvenous leads. The left jugular central line extends into the SVC. Basilar airspace opacities are present bilaterally, and there probably also is a left pleural effusion. There is worsened consolidation in the medial bases.  IMPRESSION: Worsened basilar consolidation and probable small pleural effusion.   Electronically Signed   By: Andreas Newport M.D.   On: 10/13/2014 06:00   Dg Hip Unilat With Pelvis 2-3 Views Left  11/06/2014   CLINICAL DATA:  Dizziness and fall with left hip pain.  EXAM: LEFT HIP (WITH PELVIS) 2-3 VIEWS  COMPARISON:  05/28/2013 and CT 09/30/2014  FINDINGS: Examination demonstrates diffuse decreased bone mineralization. There are mild symmetric degenerative changes of the hips. Fixation hardware over the right femur is intact and unchanged. No evidence of acute fracture dislocation of the left hip/pelvis. There are degenerative changes of the spine as well as calcified plaque over the abdominal aorta and iliac arteries.  IMPRESSION: No acute findings.   Electronically Signed   By: Marin Olp M.D.   On: 11/06/2014 10:13    Microbiology: Recent Results (from the past 240 hour(s))  Clostridium Difficile by PCR     Status: None   Collection Time: 11/06/14  9:40 PM  Result Value Ref Range Status   C difficile by pcr NEGATIVE NEGATIVE Final     Labs: Basic Metabolic Panel:  Recent Labs Lab 11/06/14 0935 11/07/14 0228  11/08/14 0010 11/09/14 0525  NA 136 139 137 136  K 3.8 3.7 4.2 4.7  CL 106 108 111 105  CO2 21* '26 22 23  ' GLUCOSE 94 114* 121* 116*  BUN 21* 21* 12 7  CREATININE 0.59* 0.63 0.54* 0.57*  CALCIUM 8.6* 8.1* 7.9* 8.6*   Liver Function Tests: No results for input(s): AST, ALT, ALKPHOS, BILITOT, PROT, ALBUMIN in the last 168 hours. No results for input(s): LIPASE, AMYLASE in the last 168 hours. No results for input(s): AMMONIA in the last 168 hours. CBC:  Recent Labs Lab 11/06/14 0935  11/08/14 0010 11/08/14 0855 11/08/14 1655 11/08/14 2338 11/09/14 0756  WBC 5.9  < > 5.1 5.1 5.7 4.8 5.8  NEUTROABS 2.8  --   --   --   --   --   --   HGB 9.9*  < > 7.9* 8.4* 9.9* 9.1* 9.7*  HCT 31.5*  < > 25.4* 27.5* 31.7* 28.6* 31.0*  MCV 97.8  < > 100.8* 101.5* 96.4 96.6 96.6  PLT 143*  < > 120* 125* 125* 114* 108*  < > = values in this interval not displayed. Cardiac Enzymes: No results for input(s): CKTOTAL, CKMB, CKMBINDEX, TROPONINI in the last 168 hours. BNP: BNP (last 3 results) No results for input(s): BNP in the last 8760 hours.  ProBNP (last 3 results) No results for input(s): PROBNP in the last 8760 hours.  CBG: No results for input(s): GLUCAP in the last 168 hours.     Signed:  Vernell Leep, MD, FACP, FHM. [facility] Pager [phone]  If 7PM-7AM, please contact night-coverage [email or web address] Password [facility] 11/09/2014, 12:46 PM

## 2014-11-09 NOTE — Progress Notes (Signed)
Physical Therapy Treatment Patient Details Name: [name] MRN: [id number] DOB: Feb 03, 1946 Today's Date: 11/09/2014    History of Present Illness Patient is a 69 y/o male with PMH of HLD, gout, depression, pacemaker placement secondary to complete AV block, A-fib, hx of GI bleed and BPH who presents with rectal bleeding and dizziness. Pt is 4 weeks s/p subtotal colectomy with ileorectal anastamosis for 2 rectal polyps that had high grade dysplasia. Sigmoidoscopy shows polyp in the rectum that has at least some high grade dysplasia.    PT Comments    Patient would ambulate more safely if he would consider  A RW. Plans to return to ALF.  Follow Up Recommendations  Supervision - Intermittent;Home health PT     Equipment Recommendations  None recommended by PT    Recommendations for Other Services       Precautions / Restrictions Precautions Precautions: Fall    Mobility  Bed Mobility                  Transfers     Transfers: Sit to/from Stand Sit to Stand: Supervision         General transfer comment: cues for safety, reaching for IV pole  Ambulation/Gait Ambulation/Gait assistance: Min guard Ambulation Distance (Feet): 200 Feet   Gait Pattern/deviations: Antalgic;Decreased stride length;Staggering right;Staggering left     General Gait Details: patient declines to use a RW, pushing IV poe, reaching for rails, door frames,   Stairs            Wheelchair Mobility    Modified Rankin (Stroke Patients Only)       Balance           Standing balance support: No upper extremity supported;During functional activity Standing balance-Leahy Scale: Fair                      Cognition Arousal/Alertness: Awake/alert     Area of Impairment: Safety/judgement     Memory: Decreased short-term memory   Safety/Judgement: Decreased awareness of safety     General Comments: Pt impulsive with movement and .    Exercises      General  Comments        Pertinent Vitals/Pain Faces Pain Scale: Hurts even more Pain Location: L foot and R thigh/hip Pain Descriptors / Indicators: Sore Pain Intervention(s): Monitored during session    Home Living                      Prior Function            PT Goals (current goals can now be found in the care plan section) Progress towards PT goals: Progressing toward goals    Frequency  Min 3X/week    PT Plan Current plan remains appropriate    Co-evaluation             End of Session   Activity Tolerance: Patient tolerated treatment well Patient left: in bed;with call bell/phone within reach;with nursing/sitter in room     Time: 1120-1135 PT Time Calculation (min) (ACUTE ONLY): 15 min  Charges:  $Gait Training: 8-22 mins                    G Codes:      Marcelino Freestone PT [phone]  11/09/2014, 1:07 PM

## 2014-11-09 NOTE — Progress Notes (Signed)
Patient ID: [name], male   DOB: 1945-12-05, 69 y.o.   MRN: [id number] 2 Days Post-Op  Subjective: No complaints.  Ready to go home.  Tolerating a soft diet. No further bloody BMs  Objective: Vital signs in last 24 hours: Temp:  [98.1 F (36.7 C)-99 F (37.2 C)] 98.6 F (37 C) (06/01 0800) Pulse Rate:  [48-83] 73 (06/01 0837) Resp:  [18-20] 18 (06/01 0800) BP: (102-141)/(41-84) 140/70 mmHg (06/01 0800) SpO2:  [93 %-100 %] 98 % (06/01 0800) Weight:  [48.127 kg (106 lb 1.6 oz)] 48.127 kg (106 lb 1.6 oz) (06/01 0602) Last BM Date: 11/08/14  Intake/Output from previous day: 05/31 0701 - 06/01 0700 In: 1090 [P.O.:760; Blood:330] Out: 3451 [Urine:3451] Intake/Output this shift: Total I/O In: 480 [P.O.:480] Out: 475 [Urine:475]  PE: Abd: soft, NT, ND, +BS, incision healed well  Lab Results:   Recent Labs  11/08/14 2338 11/09/14 0756  WBC 4.8 5.8  HGB 9.1* 9.7*  HCT 28.6* 31.0*  PLT 114* 108*   BMET  Recent Labs  11/08/14 0010 11/09/14 0525  NA 137 136  K 4.2 4.7  CL 111 105  CO2 22 23  GLUCOSE 121* 116*  BUN 12 7  CREATININE 0.54* 0.57*  CALCIUM 7.9* 8.6*   PT/INR No results for input(s): LABPROT, INR in the last 72 hours. CMP     Component Value Date/Time   NA 136 11/09/2014 0525   NA 140 10/28/2014 1237   K 4.7 11/09/2014 0525   K 3.7 10/28/2014 1237   CL 105 11/09/2014 0525   CO2 23 11/09/2014 0525   CO2 24 10/28/2014 1237   GLUCOSE 116* 11/09/2014 0525   GLUCOSE 91 10/28/2014 1237   BUN 7 11/09/2014 0525   BUN 17.4 10/28/2014 1237   CREATININE 0.57* 11/09/2014 0525   CREATININE 0.7 10/28/2014 1237   CALCIUM 8.6* 11/09/2014 0525   CALCIUM 8.8 10/28/2014 1237   PROT 6.9 10/28/2014 1237   PROT 5.2* 10/12/2014 0334   ALBUMIN 3.3* 10/28/2014 1237   ALBUMIN 1.8* 10/12/2014 0334   AST 14 10/28/2014 1237   AST 34 10/12/2014 0334   ALT 11 10/28/2014 1237   ALT 36 10/12/2014 0334   ALKPHOS 97 10/28/2014 1237   ALKPHOS 70 10/12/2014 0334   BILITOT 0.27 10/28/2014 1237   BILITOT 0.5 10/12/2014 0334   GFRNONAA >60 11/09/2014 0525   GFRAA >60 11/09/2014 0525   Lipase     Component Value Date/Time   LIPASE 40 09/30/2014 0253       Studies/Results: No results found.  Anti-infectives: Anti-infectives    None       Assessment/Plan LGI bleed, S/p subtotal abdominal colectomy for multiple polyps for high grade dysplasia 10/05/2014 -patient just had colonoscopy that reveals another polyp that is friable and the source of bleeding in his rectum. This is more distal that our anastomosis. -will have him follow up with Dr. Georgette Dover to discuss possible need for transanal approach for polyp resection as outpatient -stable for dc home from our standpoint -patient should use imodium prn and fiber supplement to help with diarrhea.     LOS: 2 days    Alana Dayton E 11/09/2014, 10:07 AM Pager: [phone]

## 2014-11-14 ENCOUNTER — Encounter (HOSPITAL_COMMUNITY): Payer: Self-pay | Admitting: Emergency Medicine

## 2014-11-14 ENCOUNTER — Inpatient Hospital Stay (HOSPITAL_COMMUNITY)
Admission: EM | Admit: 2014-11-14 | Discharge: 2014-11-17 | DRG: 377 | Disposition: A | Discharge status: Nursing Home (Includes ICF) | Payer: Medicare Other | Attending: Family Medicine | Admitting: Family Medicine

## 2014-11-14 DIAGNOSIS — K6289 Other specified diseases of anus and rectum: Secondary | ICD-10-CM | POA: Diagnosis present

## 2014-11-14 DIAGNOSIS — I481 Persistent atrial fibrillation: Secondary | ICD-10-CM | POA: Diagnosis not present

## 2014-11-14 DIAGNOSIS — Z79899 Other long term (current) drug therapy: Secondary | ICD-10-CM

## 2014-11-14 DIAGNOSIS — L8990 Pressure ulcer of unspecified site, unspecified stage: Secondary | ICD-10-CM | POA: Insufficient documentation

## 2014-11-14 DIAGNOSIS — K922 Gastrointestinal hemorrhage, unspecified: Secondary | ICD-10-CM | POA: Diagnosis not present

## 2014-11-14 DIAGNOSIS — F17220 Nicotine dependence, chewing tobacco, uncomplicated: Secondary | ICD-10-CM | POA: Diagnosis present

## 2014-11-14 DIAGNOSIS — N400 Enlarged prostate without lower urinary tract symptoms: Secondary | ICD-10-CM | POA: Diagnosis present

## 2014-11-14 DIAGNOSIS — Z9049 Acquired absence of other specified parts of digestive tract: Secondary | ICD-10-CM | POA: Diagnosis present

## 2014-11-14 DIAGNOSIS — L89151 Pressure ulcer of sacral region, stage 1: Secondary | ICD-10-CM | POA: Diagnosis present

## 2014-11-14 DIAGNOSIS — Z681 Body mass index (BMI) 19 or less, adult: Secondary | ICD-10-CM | POA: Diagnosis not present

## 2014-11-14 DIAGNOSIS — I4891 Unspecified atrial fibrillation: Secondary | ICD-10-CM | POA: Diagnosis present

## 2014-11-14 DIAGNOSIS — I10 Essential (primary) hypertension: Secondary | ICD-10-CM | POA: Diagnosis present

## 2014-11-14 DIAGNOSIS — K625 Hemorrhage of anus and rectum: Secondary | ICD-10-CM | POA: Diagnosis not present

## 2014-11-14 DIAGNOSIS — F17210 Nicotine dependence, cigarettes, uncomplicated: Secondary | ICD-10-CM | POA: Diagnosis present

## 2014-11-14 DIAGNOSIS — D125 Benign neoplasm of sigmoid colon: Secondary | ICD-10-CM | POA: Diagnosis not present

## 2014-11-14 DIAGNOSIS — E785 Hyperlipidemia, unspecified: Secondary | ICD-10-CM | POA: Diagnosis present

## 2014-11-14 DIAGNOSIS — C8510 Unspecified B-cell lymphoma, unspecified site: Secondary | ICD-10-CM | POA: Diagnosis present

## 2014-11-14 DIAGNOSIS — I2510 Atherosclerotic heart disease of native coronary artery without angina pectoris: Secondary | ICD-10-CM | POA: Diagnosis present

## 2014-11-14 DIAGNOSIS — D128 Benign neoplasm of rectum: Secondary | ICD-10-CM | POA: Diagnosis present

## 2014-11-14 DIAGNOSIS — E43 Unspecified severe protein-calorie malnutrition: Secondary | ICD-10-CM | POA: Diagnosis present

## 2014-11-14 DIAGNOSIS — F329 Major depressive disorder, single episode, unspecified: Secondary | ICD-10-CM | POA: Diagnosis present

## 2014-11-14 DIAGNOSIS — Z950 Presence of cardiac pacemaker: Secondary | ICD-10-CM | POA: Diagnosis not present

## 2014-11-14 DIAGNOSIS — M109 Gout, unspecified: Secondary | ICD-10-CM | POA: Diagnosis present

## 2014-11-14 DIAGNOSIS — R198 Other specified symptoms and signs involving the digestive system and abdomen: Secondary | ICD-10-CM

## 2014-11-14 LAB — CBC WITH DIFFERENTIAL/PLATELET
BASOS PCT: 0 % (ref 0–1)
Basophils Absolute: 0 10*3/uL (ref 0.0–0.1)
EOS ABS: 0.1 10*3/uL (ref 0.0–0.7)
EOS PCT: 1 % (ref 0–5)
HCT: 32.9 % — ABNORMAL LOW (ref 39.0–52.0)
Hemoglobin: 10.1 g/dL — ABNORMAL LOW (ref 13.0–17.0)
LYMPHS PCT: 40 % (ref 12–46)
Lymphs Abs: 2 10*3/uL (ref 0.7–4.0)
MCH: 30.4 pg (ref 26.0–34.0)
MCHC: 30.7 g/dL (ref 30.0–36.0)
MCV: 99.1 fL (ref 78.0–100.0)
Monocytes Absolute: 0.4 10*3/uL (ref 0.1–1.0)
Monocytes Relative: 8 % (ref 3–12)
Neutro Abs: 2.5 10*3/uL (ref 1.7–7.7)
Neutrophils Relative %: 51 % (ref 43–77)
Platelets: 155 10*3/uL (ref 150–400)
RBC: 3.32 MIL/uL — ABNORMAL LOW (ref 4.22–5.81)
RDW: 17.7 % — ABNORMAL HIGH (ref 11.5–15.5)
WBC: 5 10*3/uL (ref 4.0–10.5)

## 2014-11-14 LAB — BASIC METABOLIC PANEL
Anion gap: 8 (ref 5–15)
BUN: 35 mg/dL — ABNORMAL HIGH (ref 6–20)
CO2: 23 mmol/L (ref 22–32)
Calcium: 8.8 mg/dL — ABNORMAL LOW (ref 8.9–10.3)
Chloride: 111 mmol/L (ref 101–111)
Creatinine, Ser: 0.69 mg/dL (ref 0.61–1.24)
Glucose, Bld: 100 mg/dL — ABNORMAL HIGH (ref 65–99)
Potassium: 4 mmol/L (ref 3.5–5.1)
Sodium: 142 mmol/L (ref 135–145)

## 2014-11-14 LAB — OCCULT BLOOD X 1 CARD TO LAB, STOOL: FECAL OCCULT BLD: POSITIVE — AB

## 2014-11-14 LAB — PROTIME-INR
INR: 1.22 (ref 0.00–1.49)
PROTHROMBIN TIME: 15.5 s — AB (ref 11.6–15.2)

## 2014-11-14 LAB — HEMOGLOBIN AND HEMATOCRIT, BLOOD
HCT: 32.5 % — ABNORMAL LOW (ref 39.0–52.0)
Hemoglobin: 10.1 g/dL — ABNORMAL LOW (ref 13.0–17.0)

## 2014-11-14 MED ORDER — ENSURE ENLIVE PO LIQD: 237.0000 mL | Freq: Two times a day (BID) | ORAL | Status: DC

## 2014-11-14 MED ORDER — ACETAMINOPHEN 325 MG PO TABS
650.0000 mg | ORAL_TABLET | Freq: Four times a day (QID) | ORAL | Status: DC | PRN
  Filled 2014-11-14: qty 2

## 2014-11-14 MED ORDER — SODIUM CHLORIDE 0.9 % IV BOLUS (SEPSIS)
1000.0000 mL | Freq: Once | INTRAVENOUS | Status: AC
Start: 2014-11-14 — End: 2014-11-14
  Administered 2014-11-14: 1000 mL via INTRAVENOUS

## 2014-11-14 MED ORDER — HYDROMORPHONE HCL 1 MG/ML IJ SOLN
1.0000 mg | INTRAMUSCULAR | Status: DC | PRN
  Administered 2014-11-14 – 2014-11-16 (×10): 1 mg via INTRAVENOUS
  Filled 2014-11-14 (×10): qty 1

## 2014-11-14 MED ORDER — ONDANSETRON HCL 4 MG/2ML IJ SOLN: 4.0000 mg | Freq: Four times a day (QID) | INTRAMUSCULAR | Status: DC | PRN

## 2014-11-14 MED ORDER — SODIUM CHLORIDE 0.9 % IJ SOLN
3.0000 mL | Freq: Two times a day (BID) | INTRAMUSCULAR | Status: DC
  Administered 2014-11-14: 3 mL via INTRAVENOUS

## 2014-11-14 MED ORDER — NICOTINE 21 MG/24HR TD PT24
21.0000 mg | MEDICATED_PATCH | Freq: Every day | TRANSDERMAL | Status: DC
  Administered 2014-11-14 – 2014-11-17 (×4): 21 mg via TRANSDERMAL
  Filled 2014-11-14 (×4): qty 1

## 2014-11-14 MED ORDER — SODIUM CHLORIDE 0.9 % IV BOLUS (SEPSIS)
500.0000 mL | Freq: Once | INTRAVENOUS | Status: AC
  Administered 2014-11-14: 500 mL via INTRAVENOUS

## 2014-11-14 MED ORDER — ONDANSETRON HCL 4 MG PO TABS: 4.0000 mg | ORAL_TABLET | Freq: Four times a day (QID) | ORAL | Status: DC | PRN

## 2014-11-14 MED ORDER — SODIUM CHLORIDE 0.9 % IV SOLN
INTRAVENOUS | Status: DC
  Administered 2014-11-14 – 2014-11-16 (×4): via INTRAVENOUS

## 2014-11-14 MED ORDER — ACETAMINOPHEN 650 MG RE SUPP: 650.0000 mg | Freq: Four times a day (QID) | RECTAL | Status: DC | PRN

## 2014-11-14 NOTE — ED Notes (Signed)
Per ems pt from [facility], pt co abdominal and rectal bleed since 3 AM. Per pt reports dark and tarry stools. Denies nausea. BP 120/78, pulse 80 Resp 16. Hx colon cancer.

## 2014-11-14 NOTE — ED Provider Notes (Signed)
CSN: [id number]     Arrival date & time 11/14/14  1036 History   First MD Initiated Contact with Patient 11/14/14 1042     Chief Complaint  Patient presents with  . Abdominal Pain  . Rectal Bleeding     (Consider location/radiation/quality/duration/timing/severity/associated sxs/prior Treatment) Patient is a 69 y.o. male presenting with hematochezia. The history is provided by the patient (pt complains of rectal bleeding last night).  Rectal Bleeding Quality:  Bright red Amount:  Moderate Timing:  Intermittent Progression:  Waxing and waning Chronicity:  Recurrent Context: not anal fissures   Associated symptoms: no abdominal pain     Past Medical History  Diagnosis Date  . Depression   . Hypomagnesemia   . Fall at nursing home 05/27/2013    "slipped on water; said I broke my right hip" (05/27/2013)  . Migraine     "used to have them bad; haven't had one in awhile" (05/27/2013)  . Gout attack     "not too long ago; in my right foot" (05/27/2013)  . Complete heart block     reason for pacemaker/notes 05/27/2013  . Atrial fibrillation     [name] 05/27/2013  . HLD (hyperlipidemia)   . GIB (gastrointestinal bleeding)   . BPH (benign prostatic hyperplasia)    Past Surgical History  Procedure Laterality Date  . Insert / replace / remove pacemaker    . Appendectomy    . Cholecystectomy    . Open reduction of hip Right 05/28/2013    Procedure: OPEN REDUCTION OF HIP;  Surgeon: Renette Butters, MD;  Location: [place];  Service: Orthopedics;  Laterality: Right;  . Esophagogastroduodenoscopy (egd) with propofol N/A 09/26/2014    Procedure: ESOPHAGOGASTRODUODENOSCOPY (EGD) WITH PROPOFOL;  Surgeon: Jerene Bears, MD;  Location: [facility] ENDOSCOPY;  Service: Endoscopy;  Laterality: N/A;  . Colonoscopy N/A 10/01/2014    Procedure: COLONOSCOPY;  Surgeon: Jerene Bears, MD;  Location: [facility] ENDOSCOPY;  Service: Endoscopy;  Laterality: N/A;  . Partial colectomy N/A 10/05/2014    Procedure: SUBTOTAL  COLECTOMY WITH ILEORECTAL ANASTOMOSIS;  Surgeon: Donnie Mesa, MD;  Location: [place];  Service: General;  Laterality: N/A;  . Flexible sigmoidoscopy N/A 11/07/2014    Procedure: FLEXIBLE SIGMOIDOSCOPY;  Surgeon: Jerene Bears, MD;  Location: [facility] ENDOSCOPY;  Service: Endoscopy;  Laterality: N/A;   Family History  Problem Relation Age of Onset  . Colon cancer Mother    History  Substance Use Topics  . Smoking status: Former Smoker -- 0.25 packs/day for 61 years    Types: Cigarettes  . Smokeless tobacco: Former Systems developer    Types: Snuff, Chew     Comment: 05/27/2013 "haven't used chew or snuff in ~ 30 yr"  . Alcohol Use: Yes     Comment: 05/27/2013 "none in years; never had problem w/it" - not in 30 years    Review of Systems  Constitutional: Negative for appetite change and fatigue.  HENT: Negative for congestion, ear discharge and sinus pressure.   Eyes: Negative for discharge.  Respiratory: Negative for cough.   Cardiovascular: Negative for chest pain.  Gastrointestinal: Positive for blood in stool and hematochezia. Negative for abdominal pain and diarrhea.  Genitourinary: Negative for frequency and hematuria.  Musculoskeletal: Negative for back pain.  Skin: Negative for rash.  Neurological: Negative for seizures and headaches.  Psychiatric/Behavioral: Negative for hallucinations.      Allergies  Review of patient's allergies indicates no known allergies.  Home Medications   Prior to Admission medications  Medication Sig Start Date End Date Taking? Authorizing Provider  Calcium Carbonate-Vit D-Min (CALTRATE 600+D PLUS MINERALS) 600-800 MG-UNIT TABS Take 1 tablet by mouth daily.   Yes Historical Provider, MD  carvedilol (COREG) 12.5 MG tablet Take 12.5 mg by mouth 2 (two) times daily with a meal.   Yes Historical Provider, MD  finasteride (PROSCAR) 5 MG tablet Take 5 mg by mouth every morning.   Yes Historical Provider, MD  loperamide (IMODIUM) 2 MG capsule Take 1 capsule (2 mg  total) by mouth as needed for diarrhea or loose stools (you may give 2mg  of imodium after each loose stool, up to 16 mg total). 10/17/14  Yes Domenic Polite, MD  magnesium oxide (MAG-OX) 400 MG tablet Take 400 mg by mouth 2 (two) times daily.   Yes Historical Provider, MD  pravastatin (PRAVACHOL) 10 MG tablet Take 10 mg by mouth at bedtime.   Yes Historical Provider, MD  sertraline (ZOLOFT) 50 MG tablet Take 50 mg by mouth every morning.    Yes Historical Provider, MD  traMADol (ULTRAM) 50 MG tablet Take 1 tablet (50 mg total) by mouth every 6 (six) hours as needed for moderate pain or severe pain. 11/09/14  Yes Modena Jansky, MD  zolpidem (AMBIEN) 5 MG tablet Take 5 mg by mouth at bedtime.   Yes Historical Provider, MD  feeding supplement, ENSURE, (ENSURE) PUDG Take 1 Container by mouth 3 (three) times daily between meals. Patient not taking: Reported on 11/14/2014 11/09/14   Modena Jansky, MD  nicotine (NICODERM CQ - DOSED IN MG/24 HOURS) 21 mg/24hr patch Place 1 patch (21 mg total) onto the skin daily. Patient not taking: Reported on 11/14/2014 11/09/14   Modena Jansky, MD  polycarbophil (FIBERCON) 625 MG tablet Take 1 tablet (625 mg total) by mouth daily. Patient not taking: Reported on 11/14/2014 11/09/14   Modena Jansky, MD   BP 115/58 mmHg  Pulse 79  Temp(Src) 97.8 F (36.6 C) (Oral)  Resp 16  SpO2 98% Physical Exam  Constitutional: He is oriented to person, place, and time. He appears well-developed.  HENT:  Head: Normocephalic.  Eyes: Conjunctivae and EOM are normal. No scleral icterus.  Neck: Neck supple. No thyromegaly present.  Cardiovascular: Normal rate and regular rhythm.  Exam reveals no gallop and no friction rub.   No murmur heard. Pulmonary/Chest: No stridor. He has no wheezes. He has no rales. He exhibits no tenderness.  Abdominal: He exhibits no distension. There is no tenderness. There is no rebound.  Genitourinary:  Hem pos brown stool  Musculoskeletal: Normal range  of motion. He exhibits no edema.  Lymphadenopathy:    He has no cervical adenopathy.  Neurological: He is oriented to person, place, and time. He exhibits normal muscle tone. Coordination normal.  Skin: No rash noted. No erythema.  Psychiatric: He has a normal mood and affect. His behavior is normal.    ED Course  Procedures (including critical care time) Labs Review Labs Reviewed  CBC WITH DIFFERENTIAL/PLATELET - Abnormal; Notable for the following:    RBC 3.32 (*)    Hemoglobin 10.1 (*)    HCT 32.9 (*)    RDW 17.7 (*)    All other components within normal limits  BASIC METABOLIC PANEL - Abnormal; Notable for the following:    Glucose, Bld 100 (*)    BUN 35 (*)    Calcium 8.8 (*)    All other components within normal limits  OCCULT BLOOD X 1 CARD TO  LAB, STOOL    Imaging Review No results found.   EKG Interpretation None      MDM   Final diagnoses:  Rectal bleeding   Rectal bleeding from polyp.  Med admit,  Surgery consult    Milton Ferguson, MD 11/14/14 [phone]

## 2014-11-14 NOTE — Progress Notes (Signed)
ED CM spoke with [place] about cm care consult for wheel chair need upon d/c and discussed pt admission to [place] to follow pt at [facility] for other d/c needs

## 2014-11-14 NOTE — H&P (Signed)
History and Physical        Hospital Admission Note Date: 11/14/2014  Patient name: [name] Medical record number: [id number] Date of birth: 1945-12-08 Age: 69 y.o. Gender: male  PCP: [name], MD  Referring physician: Dr Roderic Palau  Chief Complaint:  Rectal bleeding since morning  HPI: Patient is a 69 year old male with history of atrial fibrillation (not on anticoagulation due to GI bleed), history of complete heart block with pacemaker, CAD, recently lengthy and recurrent hospitalizations significant for GI bleed. Patient was found to have cecal and rectal mass on colonoscopies. 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 He underwent underwent total colectomy with review rectal anastomosis. Patient is also followed outpatient by oncology for low-grade non-Hodgkin B-cell lymphoma. Patient also underwent flexible sigmoidoscopy on 5/30 which had shown ileorectal anastomosis with very superficial erosion, rectal mass with friable surface, was recommended surgery for transanal resection versus further rectal resection. The patient presented today from the facility with rectal bleeding since 3 AM in the morning. History was obtained from the patient who reported at least 5 episodes of bloody watery stools, BRBPR. He denied any fevers or chills or any abdominal pain. He denied any nausea or vomiting or hematemesis.  Review of Systems:  Constitutional: Denies fever, chills, diaphoresis, poor appetite and fatigue.  HEENT: Denies photophobia, eye pain, redness, hearing loss, ear pain, congestion, sore throat, rhinorrhea, sneezing, mouth sores, trouble swallowing, neck pain, neck stiffness and tinnitus.   Respiratory: Denies SOB, DOE, cough, chest tightness,  and wheezing.   Cardiovascular: Denies chest pain, palpitations and leg swelling.  Gastrointestinal: Please  see history of present illness  Genitourinary: Denies dysuria, urgency, frequency, hematuria, flank pain and difficulty urinating.  Musculoskeletal: Denies myalgias, back pain, joint swelling, arthralgias and gait problem.  Skin: Denies pallor, rash and wound.  Neurological: Denies dizziness, seizures, syncope, weakness, light-headedness, numbness and headaches.  Hematological: Denies adenopathy. Easy bruising, personal or family bleeding history  Psychiatric/Behavioral: Denies suicidal ideation, mood changes, confusion, nervousness, sleep disturbance and agitation  Past Medical History: Past Medical History  Diagnosis Date  . Depression   . Hypomagnesemia   . Fall at nursing home 05/27/2013    "slipped on water; said I broke my right hip" (05/27/2013)  . Migraine     "used to have them bad; haven't had one in awhile" (05/27/2013)  . Gout attack     "not too long ago; in my right foot" (05/27/2013)  . Complete heart block     reason for pacemaker/notes 05/27/2013  . Atrial fibrillation     [name] 05/27/2013  . HLD (hyperlipidemia)   . GIB (gastrointestinal bleeding)   . BPH (benign prostatic hyperplasia)     Past Surgical History  Procedure Laterality Date  . Insert / replace / remove pacemaker    . Appendectomy    . Cholecystectomy    . Open reduction of hip Right 05/28/2013    Procedure: OPEN REDUCTION OF HIP;  Surgeon: Renette Butters, MD;  Location: [place];  Service: Orthopedics;  Laterality: Right;  . Esophagogastroduodenoscopy (egd) with propofol N/A 09/26/2014    Procedure: ESOPHAGOGASTRODUODENOSCOPY (EGD) WITH PROPOFOL;  Surgeon: Ulice Dash  Everitt Amber, MD;  Location: [place];  Service: Endoscopy;  Laterality: N/A;  . Colonoscopy N/A 10/01/2014    Procedure: COLONOSCOPY;  Surgeon: Jerene Bears, MD;  Location: [facility] ENDOSCOPY;  Service: Endoscopy;  Laterality: N/A;  . Partial colectomy N/A 10/05/2014    Procedure: SUBTOTAL COLECTOMY WITH ILEORECTAL ANASTOMOSIS;  Surgeon: Donnie Mesa, MD;  Location: [place];  Service: General;  Laterality: N/A;  . Flexible sigmoidoscopy N/A 11/07/2014    Procedure: FLEXIBLE SIGMOIDOSCOPY;  Surgeon: Jerene Bears, MD;  Location: [facility] ENDOSCOPY;  Service: Endoscopy;  Laterality: N/A;    Medications: Prior to Admission medications   Medication Sig Start Date End Date Taking? Authorizing Provider  Calcium Carbonate-Vit D-Min (CALTRATE 600+D PLUS MINERALS) 600-800 MG-UNIT TABS Take 1 tablet by mouth daily.   Yes Historical Provider, MD  carvedilol (COREG) 12.5 MG tablet Take 12.5 mg by mouth 2 (two) times daily with a meal.   Yes Historical Provider, MD  finasteride (PROSCAR) 5 MG tablet Take 5 mg by mouth every morning.   Yes Historical Provider, MD  loperamide (IMODIUM) 2 MG capsule Take 1 capsule (2 mg total) by mouth as needed for diarrhea or loose stools (you may give 2mg  of imodium after each loose stool, up to 16 mg total). 10/17/14  Yes Domenic Polite, MD  magnesium oxide (MAG-OX) 400 MG tablet Take 400 mg by mouth 2 (two) times daily.   Yes Historical Provider, MD  pravastatin (PRAVACHOL) 10 MG tablet Take 10 mg by mouth at bedtime.   Yes Historical Provider, MD  sertraline (ZOLOFT) 50 MG tablet Take 50 mg by mouth every morning.    Yes Historical Provider, MD  traMADol (ULTRAM) 50 MG tablet Take 1 tablet (50 mg total) by mouth every 6 (six) hours as needed for moderate pain or severe pain. 11/09/14  Yes Modena Jansky, MD  zolpidem (AMBIEN) 5 MG tablet Take 5 mg by mouth at bedtime.   Yes Historical Provider, MD  feeding supplement, ENSURE, (ENSURE) PUDG Take 1 Container by mouth 3 (three) times daily between meals. Patient not taking: Reported on 11/14/2014 11/09/14   Modena Jansky, MD  nicotine (NICODERM CQ - DOSED IN MG/24 HOURS) 21 mg/24hr patch Place 1 patch (21 mg total) onto the skin daily. Patient not taking: Reported on 11/14/2014 11/09/14   Modena Jansky, MD  polycarbophil (FIBERCON) 625 MG tablet Take 1 tablet (625 mg total) by  mouth daily. Patient not taking: Reported on 11/14/2014 11/09/14   Modena Jansky, MD    Allergies:  No Known Allergies  Social History:  reports that he has quit smoking. His smoking use included Cigarettes. He has a 15.25 pack-year smoking history. He has quit using smokeless tobacco. His smokeless tobacco use included Snuff and Chew. He reports that he drinks alcohol. He reports that he does not use illicit drugs.  Family History: Reviewed with the patient Family History  Problem Relation Age of Onset  . Colon cancer Mother     Physical Exam: Blood pressure 115/58, pulse 79, temperature 97.8 F (36.6 C), temperature source Oral, resp. rate 16, SpO2 98 %. General: Alert, awake, oriented x3, in no acute distress, frail. HEENT: normocephalic, atraumatic, anicteric sclera, pink conjunctiva, pupils equal and reactive to light and accomodation, oropharynx clear Neck: supple, no masses or lymphadenopathy, no goiter, no bruits  Heart: Regular rate and rhythm, without murmurs, rubs or gallops. Lungs: Clear to auscultation bilaterally, no wheezing, rales or rhonchi. Abdomen: Soft, nontender, nondistended, positive bowel  sounds, no masses. Extremities: No clubbing, cyanosis or edema with positive pedal pulses. Neuro: Grossly intact, no focal neurological deficits, strength 5/5 upper and lower extremities bilaterally Psych: alert and oriented x 3, normal mood and affect Skin: no rashes or lesions, warm and dry   LABS on Admission:  Basic Metabolic Panel:  Recent Labs Lab 11/09/14 0525 11/14/14 1112  NA 136 142  K 4.7 4.0  CL 105 111  CO2 23 23  GLUCOSE 116* 100*  BUN 7 35*  CREATININE 0.57* 0.69  CALCIUM 8.6* 8.8*   Liver Function Tests: No results for input(s): AST, ALT, ALKPHOS, BILITOT, PROT, ALBUMIN in the last 168 hours. No results for input(s): LIPASE, AMYLASE in the last 168 hours. No results for input(s): AMMONIA in the last 168 hours. CBC:  Recent Labs Lab  11/09/14 0756 11/14/14 1113  WBC 5.8 5.0  NEUTROABS  --  2.5  HGB 9.7* 10.1*  HCT 31.0* 32.9*  MCV 96.6 99.1  PLT 108* 155   Cardiac Enzymes: No results for input(s): CKTOTAL, CKMB, CKMBINDEX, TROPONINI in the last 168 hours. BNP: Invalid input(s): POCBNP CBG: No results for input(s): GLUCAP in the last 168 hours.  Radiological Exams on Admission:  Ct Head Wo Contrast  11/06/2014   CLINICAL DATA:  Fall, on Xarelto  EXAM: CT HEAD WITHOUT CONTRAST  TECHNIQUE: Contiguous axial images were obtained from the base of the skull through the vertex without intravenous contrast.  COMPARISON:  10/27/2006  FINDINGS: Motion degraded images.  No evidence of parenchymal hemorrhage or extra-axial fluid collection. No mass lesion, mass effect, or midline shift.  No CT evidence of acute infarction.  Subcortical white matter and periventricular small vessel ischemic changes. Intracranial atherosclerosis.  Global cortical atrophy.  No ventriculomegaly.  The visualized paranasal sinuses are essentially clear. The mastoid air cells are unopacified.  No evidence of calvarial fracture.  IMPRESSION: No evidence of acute intracranial abnormality.  Atrophy with small vessel ischemic changes.   Electronically Signed   By: Julian Hy M.D.   On: 11/06/2014 10:08   Dg Hip Unilat With Pelvis 2-3 Views Left  11/06/2014   CLINICAL DATA:  Dizziness and fall with left hip pain.  EXAM: LEFT HIP (WITH PELVIS) 2-3 VIEWS  COMPARISON:  05/28/2013 and CT 09/30/2014  FINDINGS: Examination demonstrates diffuse decreased bone mineralization. There are mild symmetric degenerative changes of the hips. Fixation hardware over the right femur is intact and unchanged. No evidence of acute fracture dislocation of the left hip/pelvis. There are degenerative changes of the spine as well as calcified plaque over the abdominal aorta and iliac arteries.  IMPRESSION: No acute findings.   Electronically Signed   By: Marin Olp M.D.   On:  11/06/2014 10:13    *I have personally reviewed the images above*     Assessment/Plan Principal Problem:   Lower GI bleed: With history of recently diagnosed cecal and rectal mass on colonoscopies. He was found to have a tubular adenoma with high-grade dysplasia, underwent underwent total colectomy with review rectal anastomosis. Subsequent flexible sigmoidoscopy on 5/30 showed rectal mass with friable surfaces, was recommended surgical consult for consideration of transanal resection sources for the rectal resection. - Admit to telemetry, currently hemodynamically stable follow closely, low threshold to transfer to stepdown if having gross bleeding, serial H&H, NPO, IV fluids - GI consulted, surgery also has been consulted by EDP - Transfuse packed RBC for hemoglobin below 7.   Active Problems:   Atrial fibrillation - Currently rate controlled,  on Coreg outpatient  - No intake medications currently to GI bleed    Sigmoid & cecal bleeding colon masses s/p abdominal colectomy 10/05/2014 - GI and surgery consulted as #1    Protein-calorie malnutrition, severe - Once patient on oral diet, will consult nutrition and place on nutritional supplements  Nicotine abuse - Place on nicotine patch    Essential hypertension - Currently stable   DVT prophylaxis: SCDs   CODE STATUS: full code  Family Communication: Admission, patients condition and plan of care including tests being ordered have been discussed with the patient  who indicates understanding and agree with the plan and Code Status  Disposition plan: Further plan will depend as patient's clinical course evolves and further radiologic and laboratory data become available.   Time Spent on Admission: 60 mins   Dao Memmott M.D. [facility] 11/14/2014, 2:55 PM Pager: [phone]  If 7PM-7AM, please contact night-coverage [email or web address] Password [email or web address]

## 2014-11-14 NOTE — Consult Note (Signed)
[facility] Progress Note     Subjective: 69 y/o white male who lives at an assisted living facility with legal guardianship with h/o rectal and cecal mass which showed high grade dysplasia s/p subtotal colectomy on 10/05/14 by Dr. Georgette Dover.  He has subsequently been admitted to [facility] with rectal bleeding and found to have another rectal polyp distal to his anastomosis site (Dr. Hilarie Fredrickson - 11/08/14).  He was discharged from [facility] on 11/09/14 after his Hgb stabilized off Xarelto.  He was pending follow up with Dr. Georgette Dover and referral to one of our colorectal surgeons for consideration of TEMS, but he developed recurrent bloody BM's this morning since 3am (11/14/14).  He notes lower abdominal pain associated with the blood watery BM's.  He's had trouble with diarrhea since discharge.  Admits to SOB and cough which is chronic secondary to smoking.  Denies any CP, N/V, constipation, hematemesis.  He says his appetite is low, but then admits to eating a lot of snacks like potato chips which he tolerates just fine - "I don't starve".  Smokes 5-6 cigarettes/day.  Objective: Vital signs in last 24 hours: Temp:  [97.8 F (36.6 C)-98.5 F (36.9 C)] 97.8 F (36.6 C) (06/06 1330) Pulse Rate:  [79-99] 79 (06/06 1330) Resp:  [16-18] 16 (06/06 1330) BP: (115-122)/(58-68) 115/58 mmHg (06/06 1330) SpO2:  [98 %-100 %] 98 % (06/06 1330)    Intake/Output from previous day:   Intake/Output this shift:    PE: General: pleasant, WD, but very thin white male who is laying in bed in NAD HEENT: head is normocephalic, atraumatic.  Sclera are noninjected.  PERRL.  Ears and nose without any masses or lesions.  Mouth is pink and moist Heart: regular, rate, and rhythm.  Normal s1,s2. No obvious murmurs, gallops, or rubs noted.  Palpable radial and pedal pulses bilaterally Lungs: CTAB, no rhonchi or rales noted, B/l wheezing heard.  Respiratory effort nonlabored. Abd: soft, NT/ND, +BS, no masses, hernias, or  organomegaly, midline incision is well healed with tiny nodule at midportion of wound likely due to sutures MS: all 4 extremities are symmetrical with no cyanosis, clubbing, or edema. Skin: warm and dry with no masses, lesions, or rashes Psych: A&Ox3 with an appropriate affect.   Lab Results:   Recent Labs  11/14/14 1113  WBC 5.0  HGB 10.1*  HCT 32.9*  PLT 155   BMET  Recent Labs  11/14/14 1112  NA 142  K 4.0  CL 111  CO2 23  GLUCOSE 100*  BUN 35*  CREATININE 0.69  CALCIUM 8.8*   PT/INR No results for input(s): LABPROT, INR in the last 72 hours. CMP     Component Value Date/Time   NA 142 11/14/2014 1112   NA 140 10/28/2014 1237   K 4.0 11/14/2014 1112   K 3.7 10/28/2014 1237   CL 111 11/14/2014 1112   CO2 23 11/14/2014 1112   CO2 24 10/28/2014 1237   GLUCOSE 100* 11/14/2014 1112   GLUCOSE 91 10/28/2014 1237   BUN 35* 11/14/2014 1112   BUN 17.4 10/28/2014 1237   CREATININE 0.69 11/14/2014 1112   CREATININE 0.7 10/28/2014 1237   CALCIUM 8.8* 11/14/2014 1112   CALCIUM 8.8 10/28/2014 1237   PROT 6.9 10/28/2014 1237   PROT 5.2* 10/12/2014 0334   ALBUMIN 3.3* 10/28/2014 1237   ALBUMIN 1.8* 10/12/2014 0334   AST 14 10/28/2014 1237   AST 34 10/12/2014 0334   ALT 11 10/28/2014 1237   ALT 36  10/12/2014 0334   ALKPHOS 97 10/28/2014 1237   ALKPHOS 70 10/12/2014 0334   BILITOT 0.27 10/28/2014 1237   BILITOT 0.5 10/12/2014 0334   GFRNONAA >60 11/14/2014 1112   GFRAA >60 11/14/2014 1112   Lipase     Component Value Date/Time   LIPASE 40 09/30/2014 0253       Studies/Results: No results found.  Anti-infectives: Anti-infectives    None       Assessment/Plan LGI bleed secondary to rectal polyp/mass distal to anastomosis site S/p subtotal abdominal colectomy for multiple polyps for high grade dysplasia 10/05/2014 -Patient had colonoscopy on 11/08/14 Dr. Hilarie Fredrickson that reveals another polyp that is friable and the source of bleeding in his rectum. This  is more distal that our anastomosis. -He had follow up with Dr. 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  I will touch base with Dr. Marcello Moores to see what she recommends. -Hgb 10.1 which is improved from discharge on 11/09/14 -I don't think he's on anticoagulation it was discontinued at discharge, but ensure it is not restarted, SCD's for dvt proph only -Check PT/INR - PT was last 18.5 and INR 1.54 on 11/08/14 -Patient should use imodium prn and fiber supplement to help with diarrhea.   1. 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 SEE COMMENT. - CECAL TUBULAR ADENOMA (1.1CM) NEGATIVE FOR HIGH GRADE DYSPLASIA AND INVASIVE MALIGNANCY - RECTAL PREVIOUS BIOPSY SITE, NEGATIVE FOR DYSPLASIA OR MALIGNANCY. - OMENTAL SOFT TISSUE INVOLVEMENT BY LOW GRADE NON-HODGKIN'S B CELL LYMPHOMA, MARGINAL ZONE TYPE - TWENTY-SIX LYMPH NODES, NEGATIVE FOR METASTATIC TUMOR (0/26). - ENDOSCOPIC TATTOO INK IDENTIFIED. - COLORECTAL ULCERATIONS WITH ISCHEMIC INJURY AND KAYEXALATE DEPOSITS. - SURGICAL MARGINS, NEGATIVE FOR EPITHELIAL DYSPLASIA OR MALIGNANCY. 2. Colon, resection margin (donut), distal - BENIGN COLON. - NEGATIVE FOR EPITHELIAL DYSPLASIA OR MALIGNANCY.   LOS: 0 days    Nat Christen 11/14/2014, 2:55 PM Pager: [phone]

## 2014-11-14 NOTE — ED Notes (Signed)
spoke to [facility]  Pt can go at 16:35...klj

## 2014-11-15 LAB — BASIC METABOLIC PANEL
ANION GAP: 9 (ref 5–15)
BUN: 36 mg/dL — AB (ref 6–20)
CO2: 21 mmol/L — AB (ref 22–32)
Calcium: 8.8 mg/dL — ABNORMAL LOW (ref 8.9–10.3)
Chloride: 114 mmol/L — ABNORMAL HIGH (ref 101–111)
Creatinine, Ser: 0.61 mg/dL (ref 0.61–1.24)
GFR calc Af Amer: >60 mL/min (ref >60)
GFR calc non Af Amer: >60 mL/min (ref >60)
Glucose, Bld: 70 mg/dL (ref 65–99)
POTASSIUM: 4 mmol/L (ref 3.5–5.1)
Sodium: 144 mmol/L (ref 135–145)

## 2014-11-15 LAB — HEMOGLOBIN AND HEMATOCRIT, BLOOD
HCT: 35.5 % — ABNORMAL LOW (ref 39.0–52.0)
HEMATOCRIT: 33.7 % — AB (ref 39.0–52.0)
HEMATOCRIT: 36.8 % — AB (ref 39.0–52.0)
HEMOGLOBIN: 10.8 g/dL — AB (ref 13.0–17.0)
HEMOGLOBIN: 10.3 g/dL — AB (ref 13.0–17.0)
Hemoglobin: 11 g/dL — ABNORMAL LOW (ref 13.0–17.0)

## 2014-11-15 LAB — MRSA PCR SCREENING: MRSA BY PCR: NEGATIVE

## 2014-11-15 MED ORDER — IPRATROPIUM-ALBUTEROL 0.5-2.5 (3) MG/3ML IN SOLN
RESPIRATORY_TRACT | Status: AC
  Filled 2014-11-15: qty 3

## 2014-11-15 MED ORDER — CARVEDILOL 6.25 MG PO TABS
6.2500 mg | ORAL_TABLET | Freq: Two times a day (BID) | ORAL | Status: DC
  Administered 2014-11-15 – 2014-11-17 (×4): 6.25 mg via ORAL
  Filled 2014-11-15 (×4): qty 1

## 2014-11-15 MED ORDER — CALCIUM POLYCARBOPHIL 625 MG PO TABS
625.0000 mg | ORAL_TABLET | Freq: Every day | ORAL | Status: DC
  Administered 2014-11-15 – 2014-11-17 (×3): 625 mg via ORAL
  Filled 2014-11-15 (×3): qty 1

## 2014-11-15 MED ORDER — LORAZEPAM 2 MG/ML IJ SOLN
0.5000 mg | Freq: Once | INTRAMUSCULAR | Status: AC
  Administered 2014-11-15: 0.5 mg via INTRAVENOUS
  Filled 2014-11-15: qty 1

## 2014-11-15 MED ORDER — GUAIFENESIN-DM 100-10 MG/5ML PO SYRP
5.0000 mL | ORAL_SOLUTION | ORAL | Status: DC | PRN
  Administered 2014-11-15: 5 mL via ORAL
  Filled 2014-11-15 (×2): qty 10

## 2014-11-15 MED ORDER — LOPERAMIDE HCL 2 MG PO CAPS
2.0000 mg | ORAL_CAPSULE | ORAL | Status: DC | PRN
Start: 2014-11-15 — End: 2014-11-17

## 2014-11-15 MED ORDER — IPRATROPIUM-ALBUTEROL 0.5-2.5 (3) MG/3ML IN SOLN
3.0000 mL | Freq: Two times a day (BID) | RESPIRATORY_TRACT | Status: DC
  Administered 2014-11-15: 3 mL via RESPIRATORY_TRACT
  Filled 2014-11-15: qty 3

## 2014-11-15 MED ORDER — SERTRALINE HCL 50 MG PO TABS
50.0000 mg | ORAL_TABLET | Freq: Every morning | ORAL | Status: DC
  Administered 2014-11-15 – 2014-11-17 (×3): 50 mg via ORAL
  Filled 2014-11-15 (×3): qty 1

## 2014-11-15 MED ORDER — ENSURE PUDDING PO PUDG: 1.0000 | Freq: Three times a day (TID) | ORAL | Status: DC

## 2014-11-15 MED ORDER — IPRATROPIUM-ALBUTEROL 0.5-2.5 (3) MG/3ML IN SOLN: 3.0000 mL | Freq: Four times a day (QID) | RESPIRATORY_TRACT | Status: DC | PRN

## 2014-11-15 MED ORDER — BENZONATATE 100 MG PO CAPS: 100.0000 mg | ORAL_CAPSULE | Freq: Three times a day (TID) | ORAL | Status: DC | PRN

## 2014-11-15 MED ORDER — FINASTERIDE 5 MG PO TABS
5.0000 mg | ORAL_TABLET | Freq: Every day | ORAL | Status: DC
Start: 2014-11-15 — End: 2014-11-17
  Administered 2014-11-15 – 2014-11-17 (×3): 5 mg via ORAL
  Filled 2014-11-15 (×3): qty 1

## 2014-11-15 MED ORDER — PRAVASTATIN SODIUM 10 MG PO TABS
10.0000 mg | ORAL_TABLET | Freq: Every day | ORAL | Status: DC
  Administered 2014-11-15 – 2014-11-16 (×2): 10 mg via ORAL
  Filled 2014-11-15 (×2): qty 1

## 2014-11-15 NOTE — Progress Notes (Signed)
Initial Nutrition Assessment  DOCUMENTATION CODES:  Severe malnutrition in context of chronic illness, Underweight  INTERVENTION: - Will order Magic Cup TID - Will order [facility] Pudding TID - Will d/c Ensure Enlive as pt states he does not like it and refuses to drink it - RD will continue to monitor for needs  NUTRITION DIAGNOSIS:  Malnutrition related to chronic illness as evidenced by severe depletion of muscle mass, severe depletion of body fat.  GOAL:  Patient will meet greater than or equal to 90% of their needs  MONITOR:  PO intake, Diet advancement, Supplement acceptance, Weight trends, Labs, I & O's  REASON FOR ASSESSMENT:  Malnutrition Screening Tool  ASSESSMENT: Per H&P, 69 y.o. 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 Burr Medico for low-grade non-Hodgkin B-cell lymphoma, patient presents from assisted living facility for an episode of dizziness and fall.  Pt seen for MST. BMI indicates underweight status. Pt on CLD to be advanced to FLD at 1400 today. Pt does not like items on CLD tray to include juice, coffee, and broth but he is willing to try jello. He states he does not like Ensure and he refuses to drink it. He also states that he does not like juice. He does like ice cream and would like to receive it TID; will order Magic Cup TID. Pt does not have any teeth. He is difficult to understand with mumbling, slurred speech.  Physical assessment indicates severe muscle and fat wasting to all areas. Weight has been fairly stable per weight hx review. Not meeting needs. PTA, he was ordered Ensure Pudding and FiberCon at  facility.   Medications reviewed. Labs reviewed; Cl: 114 mmol/L, BUN elevated.  Height:  Ht Readings from Last 1 Encounters:  11/14/14 5\' 9"  (1.753 m)    Weight:  Wt Readings from Last 1 Encounters:  11/14/14 99 lb 8 oz (45.133 kg)    Ideal Body Weight:  72.7 kg (kg)  Wt Readings from Last 10 Encounters:  11/14/14 99 lb 8 oz (45.133 kg)  11/09/14 106 lb 1.6 oz (48.127 kg)  10/28/14 96 lb 1.6 oz (43.591 kg)  05/31/13 110 lb 6.4 oz (50.077 kg)  12/24/06 94 lb 0.8 oz (42.661 kg)    BMI:  Body mass index is 14.69 kg/(m^2).  Estimated Nutritional Needs:  Kcal:  1500-1700  Protein:  60-70 grams  Fluid:  2 L/day  Skin:  Wound (see comment) (stage 1 sacral pressure ulcer, abdominal incision present on admission)  Diet Order:  Diet clear liquid Room service appropriate?: Yes; Fluid consistency:: Thin Diet full liquid Room service appropriate?: Yes; Fluid consistency:: Thin  EDUCATION NEEDS:  No education needs identified at this time   Intake/Output Summary (Last 24 hours) at 11/15/14 1325 Last data filed at 11/15/14 0600  Gross per 24 hour  Intake    960 ml  Output      0 ml  Net    960 ml    Last BM:  PTA   Jarome Matin, RD, LDN Inpatient Clinical Dietitian Pager # [phone] After hours/weekend pager # [phone]

## 2014-11-15 NOTE — Progress Notes (Signed)
[facility] Progress Note     Subjective: Pt very sleepy apparently had to be given ativan due to agitation last night.  Denies any pain or N/V.  Unable to tell me if he's had any bloody BM's.  Nurse overnight reports he had several soft brown stools without gross blood, but was heme-occult positive.    Objective: Vital signs in last 24 hours: Temp:  [97.5 F (36.4 C)-98.5 F (36.9 C)] 97.5 F (36.4 C) (06/07 0506) Pulse Rate:  [40-106] 106 (06/07 0506) Resp:  [16-18] 16 (06/07 0506) BP: (100-122)/(45-68) 114/45 mmHg (06/07 0506) SpO2:  [90 %-100 %] 90 % (06/07 0506) Weight:  [45.133 kg (99 lb 8 oz)] 45.133 kg (99 lb 8 oz) (06/06 2037) Last BM Date: 11/14/14  Intake/Output from previous day: 06/06 0701 - 06/07 0700 In: 960 [I.V.:960] Out: -  Intake/Output this shift:    PE: Gen:  Alert, NAD, pleasant Abd: Soft, NT/ND, +BS, no HSM   Lab Results:   Recent Labs  11/14/14 1113 11/14/14 1738 11/15/14 0013  WBC 5.0  --   --   HGB 10.1* 10.1* 10.8*  HCT 32.9* 32.5* 35.5*  PLT 155  --   --    BMET  Recent Labs  11/14/14 1112 11/15/14 0013  NA 142 144  K 4.0 4.0  CL 111 114*  CO2 23 21*  GLUCOSE 100* 70  BUN 35* 36*  CREATININE 0.69 0.61  CALCIUM 8.8* 8.8*   PT/INR  Recent Labs  11/14/14 1835  LABPROT 15.5*  INR 1.22   CMP     Component Value Date/Time   NA 144 11/15/2014 0013   NA 140 10/28/2014 1237   K 4.0 11/15/2014 0013   K 3.7 10/28/2014 1237   CL 114* 11/15/2014 0013   CO2 21* 11/15/2014 0013   CO2 24 10/28/2014 1237   GLUCOSE 70 11/15/2014 0013   GLUCOSE 91 10/28/2014 1237   BUN 36* 11/15/2014 0013   BUN 17.4 10/28/2014 1237   CREATININE 0.61 11/15/2014 0013   CREATININE 0.7 10/28/2014 1237   CALCIUM 8.8* 11/15/2014 0013   CALCIUM 8.8 10/28/2014 1237   PROT 6.9 10/28/2014 1237   PROT 5.2* 10/12/2014 0334   ALBUMIN 3.3* 10/28/2014 1237   ALBUMIN 1.8* 10/12/2014 0334   AST 14 10/28/2014 1237   AST 34 10/12/2014 0334    ALT 11 10/28/2014 1237   ALT 36 10/12/2014 0334   ALKPHOS 97 10/28/2014 1237   ALKPHOS 70 10/12/2014 0334   BILITOT 0.27 10/28/2014 1237   BILITOT 0.5 10/12/2014 0334   GFRNONAA >60 11/15/2014 0013   GFRAA >60 11/15/2014 0013   Lipase     Component Value Date/Time   LIPASE 40 09/30/2014 0253       Studies/Results: No results found.  Anti-infectives: Anti-infectives    None       Assessment/Plan LGI bleed secondary to rectal polyp/mass distal to anastomosis site S/p subtotal abdominal colectomy for multiple polyps for high grade dysplasia 10/05/2014 -Patient had colonoscopy on 11/08/14 Dr. Hilarie Fredrickson that reveals another polyp that is friable and the source of bleeding in his rectum. This is more distal that our anastomosis. -He had follow up with Dr. 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 Dr. Marcello Moores recommends OP evaluation as long as he continues to improve/stabilize. -Hgb 10.8 which continues to improve -Would recommend continuing to hold anticoagulation given his recurrent issues with bleeding -Protein supplements -Check PT/INR - PT was last 18.5 and INR 1.54 on  11/08/14 -Ordered imodium prn and fiber supplement to help with diarrhea. -Start diet and advance as tolerated, monitor H&H, maybe d/c home tomorrow if no more bleeding episodes and Hgb stable   1. 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 SEE COMMENT. - CECAL TUBULAR ADENOMA (1.1CM) NEGATIVE FOR HIGH GRADE DYSPLASIA AND INVASIVE MALIGNANCY - RECTAL PREVIOUS BIOPSY SITE, NEGATIVE FOR DYSPLASIA OR MALIGNANCY. - OMENTAL SOFT TISSUE INVOLVEMENT BY LOW GRADE NON-HODGKIN'S B CELL LYMPHOMA, MARGINAL ZONE TYPE - TWENTY-SIX LYMPH NODES, NEGATIVE FOR METASTATIC TUMOR (0/26). - ENDOSCOPIC TATTOO INK IDENTIFIED. - COLORECTAL ULCERATIONS WITH ISCHEMIC INJURY AND KAYEXALATE DEPOSITS. -  SURGICAL MARGINS, NEGATIVE FOR EPITHELIAL DYSPLASIA OR MALIGNANCY. 2. Colon, resection margin (donut), distal - BENIGN COLON. - NEGATIVE FOR EPITHELIAL DYSPLASIA OR MALIGNANCY.    LOS: 1 day    Nat Christen 11/15/2014, 7:39 AM Pager: [phone]

## 2014-11-15 NOTE — Progress Notes (Signed)
[facility]                                                                              Patient Demographics  [name], is a 69 y.o. male, DOB - 11-20-1945, KCL:[id number]  Admit date - 11/14/2014   Admitting Physician Ripudeep Krystal Eaton, MD  Outpatient Primary MD for the patient is [name], MD  LOS - 1   Chief Complaint  Patient presents with  . Abdominal Pain  . Rectal Bleeding       Brief HPI   Patient is a 69 year old male with history of atrial fibrillation (not on anticoagulation due to GI bleed), history of complete heart block with pacemaker, CAD, recently lengthy and recurrent hospitalizations significant for GI bleed. Patient was found to have cecal and rectal mass on colonoscopies. 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 He underwent underwent total colectomy with review rectal anastomosis. Patient is also followed outpatient by oncology for low-grade non-Hodgkin B-cell lymphoma. Patient also underwent flexible sigmoidoscopy on 5/30 which had shown ileorectal anastomosis with very superficial erosion, rectal mass with friable surface, was recommended surgery for transanal resection versus further rectal resection. The patient presented today from the facility with rectal bleeding since 3 AM in the morning. History was obtained from the patient who reported at least 5 episodes of bloody watery stools, BRBPR. He denied any fevers or chills or any abdominal pain. He denied any nausea or vomiting or hematemesis.   Assessment & Plan    Principal Problem:  Lower GI bleed: With history of recently diagnosed cecal and rectal mass on colonoscopies. He was found to have a tubular adenoma with high-grade dysplasia, underwent underwent total colectomy with review rectal anastomosis. Subsequent flexible sigmoidoscopy on 5/30 showed rectal mass with friable surfaces, was recommended surgical consult for consideration of transanal resection  sources for the rectal resection. - Discussed with general surgery today, hemoglobin currently stable, surgery recommend continued outpatient evaluation as long as he is stable  - Advance diet today, started on clears, serial H&H, observe overnight, possible DC tomorrow if no more bleeding and hemoglobin is stable - Transfuse packed RBC for hemoglobin below 7.   Active Problems:  Atrial fibrillation - Currently rate controlled, on Coreg outpatient, restarted beta blocker  - No anticoagulation due to to GI bleed   Sigmoid & cecal bleeding colon masses s/p abdominal colectomy 10/05/2014 - surgery consulted as #1   Protein-calorie malnutrition, severe - Place on nutritional supplements  Nicotine abuse - Place on nicotine patch   Essential hypertension - Restarted Coreg    Code Status: full code  Family Communication: Discussed in detail with the patient, all imaging results, lab results explained to the patient   Disposition Plan: Possible DC tomorrow if hemoglobin remains stable and no further bleeding  Time Spent in minutes 25 minutes  Procedures  None   Consults   surgery  DVT Prophylaxis  SCD's  Medications  Scheduled Meds: . feeding supplement (ENSURE ENLIVE)  237 mL Oral BID BM  . nicotine  21 mg Transdermal Daily  . polycarbophil  625 mg Oral Daily  .  sodium chloride  3 mL Intravenous Q12H   Continuous Infusions: . sodium chloride 75 mL/hr at 11/15/14 0750   PRN Meds:.acetaminophen **OR** acetaminophen, HYDROmorphone (DILAUDID) injection, loperamide, ondansetron **OR** ondansetron (ZOFRAN) IV   Antibiotics   Anti-infectives    None        Subjective:   [name] was seen and examined today. Sleepy, had received Ativan earlier. Patient denies dizziness, chest pain, shortness of breath, abdominal pain, N/V/D/C, new weakness, numbess, tingling. No bleeding reported since admission  Objective:   Blood pressure 114/45, pulse 106, temperature  97.5 F (36.4 C), temperature source Oral, resp. rate 16, height 5\' 9"  (1.753 m), weight 45.133 kg (99 lb 8 oz), SpO2 90 %.  Wt Readings from Last 3 Encounters:  11/14/14 45.133 kg (99 lb 8 oz)  11/09/14 48.127 kg (106 lb 1.6 oz)  10/28/14 43.591 kg (96 lb 1.6 oz)     Intake/Output Summary (Last 24 hours) at 11/15/14 1010 Last data filed at 11/15/14 0600  Gross per 24 hour  Intake    960 ml  Output      0 ml  Net    960 ml    Exam  General: Sleepy but arousable, NAD  HEENT:  PERRLA, EOMI, Anicteric Sclera, mucous membranes moist.   Neck: Supple, no JVD, no masses  CVS: S1 S2 auscultated, no rubs, murmurs or gallops. Regular rate and rhythm.  Respiratory: Clear to auscultation bilaterally, no wheezing, rales or rhonchi  Abdomen: Soft, nontender, nondistended, + bowel sounds  Ext: no cyanosis clubbing or edema  Neuro: Cr N's II- XII. Strength 5/5 upper and lower extremities bilaterally  Skin: No rashes  Psych: Sleepy   Data Review   Micro Results Recent Results (from the past 240 hour(s))  Clostridium Difficile by PCR     Status: None   Collection Time: 11/06/14  9:40 PM  Result Value Ref Range Status   C difficile by pcr NEGATIVE NEGATIVE Final  MRSA PCR Screening     Status: None   Collection Time: 11/15/14  3:30 AM  Result Value Ref Range Status   MRSA by PCR NEGATIVE NEGATIVE Final    Comment:        The GeneXpert MRSA Assay (FDA approved for NASAL specimens only), is one component of a comprehensive MRSA colonization surveillance program. It is not intended to diagnose MRSA infection nor to guide or monitor treatment for MRSA infections.     Radiology Reports Ct Head Wo Contrast  11/06/2014   CLINICAL DATA:  Fall, on Xarelto  EXAM: CT HEAD WITHOUT CONTRAST  TECHNIQUE: Contiguous axial images were obtained from the base of the skull through the vertex without intravenous contrast.  COMPARISON:  10/27/2006  FINDINGS: Motion degraded images.  No  evidence of parenchymal hemorrhage or extra-axial fluid collection. No mass lesion, mass effect, or midline shift.  No CT evidence of acute infarction.  Subcortical white matter and periventricular small vessel ischemic changes. Intracranial atherosclerosis.  Global cortical atrophy.  No ventriculomegaly.  The visualized paranasal sinuses are essentially clear. The mastoid air cells are unopacified.  No evidence of calvarial fracture.  IMPRESSION: No evidence of acute intracranial abnormality.  Atrophy with small vessel ischemic changes.   Electronically Signed   By: Julian Hy M.D.   On: 11/06/2014 10:08   Dg Hip Unilat With Pelvis 2-3 Views Left  11/06/2014   CLINICAL DATA:  Dizziness and fall with left hip pain.  EXAM: LEFT HIP (WITH PELVIS) 2-3 VIEWS  COMPARISON:  05/28/2013 and CT 09/30/2014  FINDINGS: Examination demonstrates diffuse decreased bone mineralization. There are mild symmetric degenerative changes of the hips. Fixation hardware over the right femur is intact and unchanged. No evidence of acute fracture dislocation of the left hip/pelvis. There are degenerative changes of the spine as well as calcified plaque over the abdominal aorta and iliac arteries.  IMPRESSION: No acute findings.   Electronically Signed   By: Marin Olp M.D.   On: 11/06/2014 10:13    CBC  Recent Labs Lab 11/08/14 1655 11/08/14 2338 11/09/14 0756 11/14/14 1113 11/14/14 1738 11/15/14 0013 11/15/14 0835  WBC 5.7 4.8 5.8 5.0  --   --   --   HGB 9.9* 9.1* 9.7* 10.1* 10.1* 10.8* 10.3*  HCT 31.7* 28.6* 31.0* 32.9* 32.5* 35.5* 33.7*  PLT 125* 114* 108* 155  --   --   --   MCV 96.4 96.6 96.6 99.1  --   --   --   MCH 30.1 30.7 30.2 30.4  --   --   --   MCHC 31.2 31.8 31.3 30.7  --   --   --   RDW 19.3* 19.7* 19.6* 17.7*  --   --   --   LYMPHSABS  --   --   --  2.0  --   --   --   MONOABS  --   --   --  0.4  --   --   --   EOSABS  --   --   --  0.1  --   --   --   BASOSABS  --   --   --  0.0  --   --    --     Chemistries   Recent Labs Lab 11/09/14 0525 11/14/14 1112 11/15/14 0013  NA 136 142 144  K 4.7 4.0 4.0  CL 105 111 114*  CO2 23 23 21*  GLUCOSE 116* 100* 70  BUN 7 35* 36*  CREATININE 0.57* 0.69 0.61  CALCIUM 8.6* 8.8* 8.8*   ------------------------------------------------------------------------------------------------------------------ estimated creatinine clearance is 57.2 mL/min (by C-G formula based on Cr of 0.61). ------------------------------------------------------------------------------------------------------------------ No results for input(s): HGBA1C in the last 72 hours. ------------------------------------------------------------------------------------------------------------------ No results for input(s): CHOL, HDL, LDLCALC, TRIG, CHOLHDL, LDLDIRECT in the last 72 hours. ------------------------------------------------------------------------------------------------------------------ No results for input(s): TSH, T4TOTAL, T3FREE, THYROIDAB in the last 72 hours.  Invalid input(s): FREET3 ------------------------------------------------------------------------------------------------------------------ No results for input(s): VITAMINB12, FOLATE, FERRITIN, TIBC, IRON, RETICCTPCT in the last 72 hours.  Coagulation profile  Recent Labs Lab 11/14/14 1835  INR 1.22    No results for input(s): DDIMER in the last 72 hours.  Cardiac Enzymes No results for input(s): CKMB, TROPONINI, MYOGLOBIN in the last 168 hours.  Invalid input(s): CK ------------------------------------------------------------------------------------------------------------------ Invalid input(s): POCBNP  No results for input(s): GLUCAP in the last 72 hours.   RAI,RIPUDEEP M.D. [facility] 11/15/2014, 10:10 AM  Pager: [phone]   Between 7am to 7pm - call Pager - [phone]  After 7pm go to [email or web address] - password [email or web address]  Call night coverage person covering after  7pm

## 2014-11-15 NOTE — Progress Notes (Signed)
Cm received a call from [place] at 1342 to state pt last received a walker in 2015 and is not eligible another one until 2020

## 2014-11-16 ENCOUNTER — Other Ambulatory Visit: Payer: Self-pay | Admitting: Hematology

## 2014-11-16 ENCOUNTER — Ambulatory Visit (HOSPITAL_COMMUNITY): Payer: Medicare Other

## 2014-11-16 DIAGNOSIS — L8990 Pressure ulcer of unspecified site, unspecified stage: Secondary | ICD-10-CM | POA: Insufficient documentation

## 2014-11-16 DIAGNOSIS — K922 Gastrointestinal hemorrhage, unspecified: Principal | ICD-10-CM

## 2014-11-16 DIAGNOSIS — C8580 Other specified types of non-Hodgkin lymphoma, unspecified site: Secondary | ICD-10-CM

## 2014-11-16 LAB — HEMOGLOBIN AND HEMATOCRIT, BLOOD
HEMATOCRIT: 33.9 % — AB (ref 39.0–52.0)
Hemoglobin: 10.3 g/dL — ABNORMAL LOW (ref 13.0–17.0)

## 2014-11-16 MED ORDER — CARVEDILOL 6.25 MG PO TABS: 6.2500 mg | ORAL_TABLET | Freq: Two times a day (BID) | ORAL | Status: DC

## 2014-11-16 MED ORDER — TRAMADOL HCL 50 MG PO TABS
100.0000 mg | ORAL_TABLET | Freq: Four times a day (QID) | ORAL | Status: DC | PRN
  Administered 2014-11-16 – 2014-11-17 (×3): 100 mg via ORAL
  Filled 2014-11-16 (×3): qty 2

## 2014-11-16 NOTE — Care Management Note (Signed)
Case Management Note  Patient Details  Name: [name] MRN: [id number] Date of Birth: 09-15-1945  Subjective/Objective: From [facility] to return. Noted ordered for RW but has already received a rw in 2015, not eligble to a new one unless private pay.  Await PT recommendations.                 Action/Plan:d/c plan return back to ALF.   Expected Discharge Date:   (unknown)               Expected Discharge Plan:  Assisted Living / Rest Home  In-House Referral:  Clinical Social Work  Discharge planning Services  CM Consult  Post Acute Care Choice:    Choice offered to:     DME Arranged:    DME Agency:     HH Arranged:    HH Agency:     Status of Service:  In process, will continue to follow  Medicare Important Message Given:    Date Medicare IM Given:    Medicare IM give by:    Date Additional Medicare IM Given:    Additional Medicare Important Message give by:     If discussed at [place] of Stay Meetings, dates discussed:    Additional Comments:  Dessa Phi, RN 11/16/2014, 1:36 PM

## 2014-11-16 NOTE — Evaluation (Signed)
Physical Therapy Evaluation Patient Details Name: [name] MRN: [id number] DOB: 10-03-1945 Today's Date: 11/16/2014   History of Present Illness  Patient is a 69 y/o male with PMH of HLD, gout, depression, pacemaker placement secondary to complete AV block, A-fib, hx of GI bleed and BPH who presents with rectal bleeding and dizziness and admitted for lower GI bleed secondary to rectal polyp/mass distal to anastomosis site  Clinical Impression  Pt admitted with above diagnosis. Pt currently with functional limitations due to the deficits listed below (see PT Problem List).   Pt will benefit from skilled PT to increase their independence and safety with mobility to allow discharge to the venue listed below.  Pt with unsteady, antalgic gait however refuses any assistive device.  Pt also reports falls from becoming dizzy occurring while in standing position.  Pt reports "she" is getting me a new w/c and recommended pt use w/c for safety with mobility upon d/c.      Follow Up Recommendations Supervision - Intermittent;Home health PT    Equipment Recommendations  Wheelchair (measurements PT);Wheelchair cushion (measurements PT) (uncertain of w/c access for pt at ALF)    Recommendations for Other Services       Precautions / Restrictions Precautions Precautions: Fall      Mobility  Bed Mobility               General bed mobility comments: Sitting in chair upon arrival  Transfers Overall transfer level: Needs assistance Equipment used: None Transfers: Sit to/from Stand Sit to Stand: Supervision         General transfer comment: cues for safety, reaching for IV pole and anything for UE support nearby  Ambulation/Gait Ambulation/Gait assistance: Min guard Ambulation Distance (Feet): 80 Feet Assistive device: None Gait Pattern/deviations: Step-through pattern;Decreased stride length;Narrow base of support;Antalgic     General Gait Details: patient declines to use a RW,  utilized IV pole and reaching for rails, door frames for UE support, states his chronic LE pain limits ambulation and he prefers w/c  Stairs            Wheelchair Mobility    Modified Rankin (Stroke Patients Only)       Balance Overall balance assessment: Needs assistance;History of Falls         Standing balance support: Bilateral upper extremity supported;During functional activity Standing balance-Leahy Scale: Poor                               Pertinent Vitals/Pain Pain Assessment: Faces Faces Pain Scale: Hurts even more Pain Location: L foot however also reports bil leg pain Pain Descriptors / Indicators: Sore Pain Intervention(s): Limited activity within patient's tolerance;Monitored during session    Home Living Family/patient expects to be discharged to:: Assisted living               Home Equipment: Walker - 2 wheels;Wheelchair - manual;Shower seat Additional Comments: per prior admission about a week ago however pt states he doesn't have a w/c upon asking him today    Prior Function Level of Independence: Independent with assistive device(s)         Comments: Pt reports he has lived at [facility] for 11 years and is mod I from a w/c level. Appears to be furniture walker based on assessment. Reports hx of falls from mostly dizziness     Hand Dominance   Dominant Hand: Right    Extremity/Trunk Assessment  Lower Extremity Assessment: Overall WFL for tasks assessed (diffuse muscle atrophy throughout)         Communication   Communication: No difficulties  Cognition Arousal/Alertness: Awake/alert Behavior During Therapy: WFL for tasks assessed/performed   Area of Impairment: Safety/judgement         Safety/Judgement: Decreased awareness of safety     General Comments: refuses any assistive device for gait despite pain and instability    General Comments      Exercises        Assessment/Plan     PT Assessment Patient needs continued PT services  PT Diagnosis Abnormality of gait;Acute pain   PT Problem List Decreased strength;Pain;Decreased balance;Decreased mobility;Decreased safety awareness;Decreased activity tolerance  PT Treatment Interventions Gait training;Balance training;DME instruction;Functional mobility training;Therapeutic activities;Therapeutic exercise;Patient/family education;Wheelchair mobility training   PT Goals (Current goals can be found in the Care Plan section) Acute Rehab PT Goals Patient Stated Goal: to go back to ALF PT Goal Formulation: With patient Time For Goal Achievement: 11/30/14 Potential to Achieve Goals: Good    Frequency Min 3X/week   Barriers to discharge        Co-evaluation               End of Session   Activity Tolerance: Patient limited by pain Patient left: in chair;with call bell/phone within reach           Time: 1254-1302 PT Time Calculation (min) (ACUTE ONLY): 8 min   Charges:   PT Evaluation $Initial PT Evaluation Tier I: 1 Procedure     PT G Codes:        Porsche Noguchi,KATHrine E 11/16/2014, 2:11 PM Carmelia Bake, PT, DPT 11/16/2014 Pager: [phone]

## 2014-11-16 NOTE — Discharge Instructions (Signed)
Soft-Food Meal Plan °A soft-food meal plan includes foods that are safe and easy to swallow. This meal plan typically is used: °· If you are having trouble chewing or swallowing foods. °· As a transition meal plan after only having had liquid meals for a long period. °WHAT DO I NEED TO KNOW ABOUT THE SOFT-FOOD MEAL PLAN? °A soft-food meal plan includes tender foods that are soft and easy to chew and swallow. In most cases, bite-sized pieces of food are easier to swallow. A bite-sized piece is about ½ inch or smaller. Foods in this plan do not need to be ground or pureed. °Foods that are very hard, crunchy, or sticky should be avoided. Also, breads, cereals, yogurts, and desserts with nuts, seeds, or fruits should be avoided. °WHAT FOODS CAN I EAT? °Grains °Rice and wild rice. Moist bread, dressing, pasta, and noodles. Well-moistened dry or cooked cereals, such as farina (cooked wheat cereal), oatmeal, or grits. Biscuits, breads, muffins, pancakes, and waffles that have been well moistened. °Vegetables °Shredded lettuce. Cooked, tender vegetables, including potatoes without skins. Vegetable juices. Broths or creamed soups made with vegetables that are not stringy or chewy. Strained tomatoes (without seeds). °Fruits °Canned or well-cooked fruits. Soft (ripe), peeled fresh fruits, such as peaches, nectarines, kiwi, cantaloupe, honeydew melon, and watermelon (without seeds). Soft berries with small seeds, such as strawberries. Fruit juices (without pulp). °Meats and Other Protein Sources °Moist, tender, lean beef. Mutton. Lamb. Veal. Chicken. Turkey. Liver. Ham. Fish without bones. Eggs. °Dairy °Milk, milk drinks, and cream. Plain cream cheese and cottage cheese. Plain yogurt. °Sweets/Desserts °Flavored gelatin desserts. Custard. Plain ice cream, frozen yogurt, sherbet, milk shakes, and malts. Plain cakes and cookies. Plain hard candy.  °Other °Butter, margarine (without trans fat), and cooking oils. Mayonnaise. Cream  sauces. Mild spices, salt, and sugar. Syrup, molasses, honey, and jelly. °The items listed above may not be a complete list of recommended foods or beverages. Contact your dietitian for more options. °WHAT FOODS ARE NOT RECOMMENDED? °Grains °Dry bread, toast, crackers that have not been moistened. Coarse or dry cereals, such as bran, granola, and shredded wheat. Tough or chewy crusty breads, such as French bread or baguettes. °Vegetables °Corn. Raw vegetables except shredded lettuce. Cooked vegetables that are tough or stringy. Tough, crisp, fried potatoes and potato skins. °Fruits °Fresh fruits with skins or seeds or both, such as apples, pears, or grapes. Stringy, high-pulp fruits, such as papaya, pineapple, coconut, or mango. Fruit leather, fruit roll-ups, and all dried fruits. °Meats and Other Protein Sources °Sausages and hot dogs. Meats with gristle. Fish with bones. Nuts, seeds, and chunky peanut or other nut butters. °Sweets/Desserts °Cakes or cookies that are very dry or chewy.  °The items listed above may not be a complete list of foods and beverages to avoid. Contact your dietitian for more information. °Document Released: 09/03/2007 Document Revised: 06/01/2013 Document Reviewed: 04/23/2013 °ExitCare® Patient Information ©2015 [employer]. This information is not intended to replace advice given to you by your health care provider. Make sure you discuss any questions you have with your health care provider. ° ° °

## 2014-11-16 NOTE — Discharge Summary (Addendum)
Physician Discharge Summary  [name] WCB:[id number] DOB: [birth year] DOA: 11/14/2014  PCP: [name], MD  Admit date: 11/14/2014 Discharge date: 11/16/2014  Time spent: > 35  minutes  Recommendations for Outpatient Follow-up:  1. Patient will need f/u with General surgery to make further plans given patient's rectal mass  Discharge Diagnoses:  Principal Problem:   GI bleed Active Problems:   Atrial fibrillation   Sigmoid & cecal bleeding colon masses s/p abdominal colectomy 10/05/2014   Protein-calorie malnutrition, severe   Essential hypertension   Rectal mass   Pressure ulcer   Discharge Condition: Stable  Diet recommendation: Soft diet  Filed Weights   11/14/14 2037  Weight: 45.133 kg (99 lb 8 oz)    History of present illness:  From original history of present illness 69 year old male with history of atrial fibrillation (not on anticoagulation due to GI bleed), history of complete heart block with pacemaker, CAD, recently lengthy and recurrent hospitalizations significant for GI bleed. Patient was found to have cecal and rectal mass on colonoscopies. 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 He underwent underwent total colectomy with review rectal anastomosis. Patient is also followed outpatient by oncology for low-grade non-Hodgkin B-cell lymphoma. Patient also underwent flexible sigmoidoscopy on 5/30 which had shown ileorectal anastomosis with very superficial erosion, rectal mass with friable surface, was recommended surgery for transanal resection versus further rectal resection. The patient presented today from the facility with rectal bleeding since 3 AM in the morning. History was obtained from the patient who reported at least 5 episodes of bloody watery stools, BRBPR.   Hospital Course:  Rectal mass: General surgery consulted who recommended the following: LGI bleed secondary to rectal polyp/mass distal to anastomosis site S/p subtotal  abdominal colectomy for multiple polyps for high grade dysplasia 10/05/2014 -Patient had colonoscopy on 11/08/14 Dr. Hilarie Fredrickson that reveals another polyp that is friable and the source of bleeding in his rectum. This is more distal that our anastomosis. -He had follow up with Dr. 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 Dr. Marcello Moores recommends OP evaluation, will arrange for follow up. -Hgb 10.3 which is stable -Would recommend continuing to hold anticoagulation given his recurrent issues with bleeding -Protein supplements -Check PT/INR - PT was last 18.5 and INR 1.54 on 11/08/14 -Ordered imodium prn and fiber supplement to help with diarrhea. 4-16mg /day and titrate until he has only 1-2 bm's per day, continue this at discharge -Advance to soft diet (continue this at discharge), no further bloody BM's, Hgb stable, d/c home today if okay from medical standpoint.   Procedures:  Flexible sigmoidoscopy  Consultations:  General surgery  Discharge Exam: Filed Vitals:   11/16/14 1353  BP: 139/69  Pulse: 87  Temp: 98.1 F (36.7 C)  Resp: 20    General: Pt in nad, alert and awake Cardiovascular: rrr, no mrg Respiratory: cta bl no wheezes  Discharge Instructions   Discharge Instructions    Call MD for:  difficulty breathing, headache or visual disturbances    Complete by:  As directed      Call MD for:  persistant dizziness or light-headedness    Complete by:  As directed      Call MD for:  severe uncontrolled pain    Complete by:  As directed      Call MD for:  temperature >100.4    Complete by:  As directed      Diet - low sodium heart healthy  Complete by:  As directed      Discharge instructions    Complete by:  As directed   Please call Dr. [name]' office for details on follow up appointment as the general surgical team in the hospital has set up a f/u appointment for you.     Increase activity slowly    Complete by:  As directed            Current Discharge Medication List    CONTINUE these medications which have CHANGED   Details  carvedilol (COREG) 6.25 MG tablet Take 1 tablet (6.25 mg total) by mouth 2 (two) times daily with a meal. Qty: 60 tablet, Refills: 0      CONTINUE these medications which have NOT CHANGED   Details  Calcium Carbonate-Vit D-Min (CALTRATE 600+D PLUS MINERALS) 600-800 MG-UNIT TABS Take 1 tablet by mouth daily.    finasteride (PROSCAR) 5 MG tablet Take 5 mg by mouth every morning.    loperamide (IMODIUM) 2 MG capsule Take 1 capsule (2 mg total) by mouth as needed for diarrhea or loose stools (you may give 2mg  of imodium after each loose stool, up to 16 mg total). Qty: 30 capsule, Refills: 0    magnesium oxide (MAG-OX) 400 MG tablet Take 400 mg by mouth 2 (two) times daily.    pravastatin (PRAVACHOL) 10 MG tablet Take 10 mg by mouth at bedtime.    sertraline (ZOLOFT) 50 MG tablet Take 50 mg by mouth every morning.     traMADol (ULTRAM) 50 MG tablet Take 1 tablet (50 mg total) by mouth every 6 (six) hours as needed for moderate pain or severe pain. Qty: 20 tablet, Refills: 0    zolpidem (AMBIEN) 5 MG tablet Take 5 mg by mouth at bedtime.    feeding supplement, ENSURE, (ENSURE) PUDG Take 1 Container by mouth 3 (three) times daily between meals.    polycarbophil (FIBERCON) 625 MG tablet Take 1 tablet (625 mg total) by mouth daily.      STOP taking these medications     nicotine (NICODERM CQ - DOSED IN MG/24 HOURS) 21 mg/24hr patch        No Known Allergies Follow-up Information    Call [facility].   Specialty:  [place]   Why:  As needed This company will assist with your wheelchair You can call to check on the status of wheelchair   Contact information:   [address] [place] [address] [phone]       Follow up with Rosario Adie., MD. Schedule an appointment as soon as possible for a visit in 1 week.   Specialty:  General Surgery   Why:   For post-hospital follow up regarding getting surgery for your bleeding rectal polyp.  Call the office for appointment date in time within 1-2 weeks.     Contact information:   [address] [place] [address] [phone]        The results of significant diagnostics from this hospitalization (including imaging, microbiology, ancillary and laboratory) are listed below for reference.    Significant Diagnostic Studies: Ct Head Wo Contrast  11/06/2014   CLINICAL DATA:  Fall, on Xarelto  EXAM: CT HEAD WITHOUT CONTRAST  TECHNIQUE: Contiguous axial images were obtained from the base of the skull through the vertex without intravenous contrast.  COMPARISON:  10/27/2006  FINDINGS: Motion degraded images.  No evidence of parenchymal hemorrhage or extra-axial fluid collection. No mass lesion, mass  effect, or midline shift.  No CT evidence of acute infarction.  Subcortical white matter and periventricular small vessel ischemic changes. Intracranial atherosclerosis.  Global cortical atrophy.  No ventriculomegaly.  The visualized paranasal sinuses are essentially clear. The mastoid air cells are unopacified.  No evidence of calvarial fracture.  IMPRESSION: No evidence of acute intracranial abnormality.  Atrophy with small vessel ischemic changes.   Electronically Signed   By: Julian Hy M.D.   On: 11/06/2014 10:08   Dg Hip Unilat With Pelvis 2-3 Views Left  11/06/2014   CLINICAL DATA:  Dizziness and fall with left hip pain.  EXAM: LEFT HIP (WITH PELVIS) 2-3 VIEWS  COMPARISON:  05/28/2013 and CT 09/30/2014  FINDINGS: Examination demonstrates diffuse decreased bone mineralization. There are mild symmetric degenerative changes of the hips. Fixation hardware over the right femur is intact and unchanged. No evidence of acute fracture dislocation of the left hip/pelvis. There are degenerative changes of the spine as well as calcified plaque over the abdominal aorta and iliac arteries.  IMPRESSION:  No acute findings.   Electronically Signed   By: Marin Olp M.D.   On: 11/06/2014 10:13    Microbiology: Recent Results (from the past 240 hour(s))  Clostridium Difficile by PCR     Status: None   Collection Time: 11/06/14  9:40 PM  Result Value Ref Range Status   C difficile by pcr NEGATIVE NEGATIVE Final  MRSA PCR Screening     Status: None   Collection Time: 11/15/14  3:30 AM  Result Value Ref Range Status   MRSA by PCR NEGATIVE NEGATIVE Final    Comment:        The GeneXpert MRSA Assay (FDA approved for NASAL specimens only), is one component of a comprehensive MRSA colonization surveillance program. It is not intended to diagnose MRSA infection nor to guide or monitor treatment for MRSA infections.      Labs: Basic Metabolic Panel:  Recent Labs Lab 11/14/14 1112 11/15/14 0013  NA 142 144  K 4.0 4.0  CL 111 114*  CO2 23 21*  GLUCOSE 100* 70  BUN 35* 36*  CREATININE 0.69 0.61  CALCIUM 8.8* 8.8*   Liver Function Tests: No results for input(s): AST, ALT, ALKPHOS, BILITOT, PROT, ALBUMIN in the last 168 hours. No results for input(s): LIPASE, AMYLASE in the last 168 hours. No results for input(s): AMMONIA in the last 168 hours. CBC:  Recent Labs Lab 11/14/14 1113 11/14/14 1738 11/15/14 0013 11/15/14 0835 11/15/14 1700 11/16/14 0105  WBC 5.0  --   --   --   --   --   NEUTROABS 2.5  --   --   --   --   --   HGB 10.1* 10.1* 10.8* 10.3* 11.0* 10.3*  HCT 32.9* 32.5* 35.5* 33.7* 36.8* 33.9*  MCV 99.1  --   --   --   --   --   PLT 155  --   --   --   --   --    Cardiac Enzymes: No results for input(s): CKTOTAL, CKMB, CKMBINDEX, TROPONINI in the last 168 hours. BNP: BNP (last 3 results) No results for input(s): BNP in the last 8760 hours.  ProBNP (last 3 results) No results for input(s): PROBNP in the last 8760 hours.  CBG: No results for input(s): GLUCAP in the last 168 hours.     Signed:  Velvet Bathe  [facility] 11/16/2014, 3:58  PM    Patient seen and evaluated and  ok for discharge.  Anetha Slagel, [employer]

## 2014-11-16 NOTE — Clinical Social Work Note (Signed)
Clinical Social Work Assessment  Patient Details  Name: [name] MRN: [id number] Date of Birth: [birth year]  Date of referral:  11/16/14               Reason for consult:  Facility Placement                Permission sought to share information with:  Facility Art therapist granted to share information::  Yes, Verbal Permission Granted  Name::        Agency::     Relationship::     Contact Information:     Housing/Transportation Living arrangements for the past 2 months:  [place] of Information:  Patient, Facility Patient Interpreter Needed:  None Criminal Activity/Legal Involvement Pertinent to Current Situation/Hospitalization:  No - Comment as needed Significant Relationships:  None Lives with:  Facility Resident Do you feel safe going back to the place where you live?  Yes Need for family participation in patient care:  No (Coment)  Care giving concerns:  CSW received consult that patient was admitted from [facility].    Social Worker assessment / plan:  CSW confirmed with patient that he plans to return to [facility] also confirmed with [name] at [facility] that they would be able to take patient back at discharge.   Employment status:    Forensic scientist:  Medicare, Medicaid In [place] PT Recommendations:    Information / Referral to community resources:     Patient/Family's Response to care:  Patient is anxious and ready to return to ALF.   Patient/Family's Understanding of and Emotional Response to Diagnosis, Current Treatment, and Prognosis:    Emotional Assessment Appearance:  Appears stated age Attitude/Demeanor/Rapport:    Affect (typically observed):  Pleasant, Blunt Orientation:  Oriented to Self, Oriented to Place, Oriented to  Time, Oriented to Situation Alcohol / Substance use:    Psych involvement (Current and /or in the community):     Discharge Needs  Concerns to be addressed:     Readmission within the last 30 days:    Current discharge risk:    Barriers to Discharge:      Standley Brooking, LCSW 11/16/2014, 1:27 PM

## 2014-11-16 NOTE — Progress Notes (Signed)
Pt has discharge orders. Social Worker contacted ALF and they are unable to take pt back today as their admission coordinator has left for the day. CSW will work on discharge first thing in the am. IV has already been removed in anticipation of discharge. MD made aware and orders received to leave IV out. MD to place orders for PO pain meds for overnight.   [name] [facility] 11/16/2014

## 2014-11-16 NOTE — Progress Notes (Signed)
[facility] Progress Note     Subjective: Pt denies any more bloody BM's, no abdominal pain.  Feels fine.  No N/V, wants to eat solid food.  Ambulating well.  Wants to go back to ALF.  Objective: Vital signs in last 24 hours: Temp:  [97.5 F (36.4 C)-97.8 F (36.6 C)] 97.8 F (36.6 C) (06/08 0623) Pulse Rate:  [71-97] 97 (06/08 0623) Resp:  [16-20] 20 (06/08 0623) BP: (106-116)/(64-79) 116/65 mmHg (06/08 0623) SpO2:  [98 %-100 %] 100 % (06/08 0623) Last BM Date: 11/15/14  Intake/Output from previous day: 06/07 0701 - 06/08 0700 In: 2280 [P.O.:480; I.V.:1800] Out: -  Intake/Output this shift:    PE: Gen:  Alert, NAD, pleasant Abd: Soft, NT/ND, +BS, no HSM, abdominal scars noted and well healed   Lab Results:   Recent Labs  11/14/14 1113  11/15/14 1700 11/16/14 0105  WBC 5.0  --   --   --   HGB 10.1*  < > 11.0* 10.3*  HCT 32.9*  < > 36.8* 33.9*  PLT 155  --   --   --   < > = values in this interval not displayed. BMET  Recent Labs  11/14/14 1112 11/15/14 0013  NA 142 144  K 4.0 4.0  CL 111 114*  CO2 23 21*  GLUCOSE 100* 70  BUN 35* 36*  CREATININE 0.69 0.61  CALCIUM 8.8* 8.8*   PT/INR  Recent Labs  11/14/14 1835  LABPROT 15.5*  INR 1.22   CMP     Component Value Date/Time   NA 144 11/15/2014 0013   NA 140 10/28/2014 1237   K 4.0 11/15/2014 0013   K 3.7 10/28/2014 1237   CL 114* 11/15/2014 0013   CO2 21* 11/15/2014 0013   CO2 24 10/28/2014 1237   GLUCOSE 70 11/15/2014 0013   GLUCOSE 91 10/28/2014 1237   BUN 36* 11/15/2014 0013   BUN 17.4 10/28/2014 1237   CREATININE 0.61 11/15/2014 0013   CREATININE 0.7 10/28/2014 1237   CALCIUM 8.8* 11/15/2014 0013   CALCIUM 8.8 10/28/2014 1237   PROT 6.9 10/28/2014 1237   PROT 5.2* 10/12/2014 0334   ALBUMIN 3.3* 10/28/2014 1237   ALBUMIN 1.8* 10/12/2014 0334   AST 14 10/28/2014 1237   AST 34 10/12/2014 0334   ALT 11 10/28/2014 1237   ALT 36 10/12/2014 0334   ALKPHOS 97 10/28/2014  1237   ALKPHOS 70 10/12/2014 0334   BILITOT 0.27 10/28/2014 1237   BILITOT 0.5 10/12/2014 0334   GFRNONAA >60 11/15/2014 0013   GFRAA >60 11/15/2014 0013   Lipase     Component Value Date/Time   LIPASE 40 09/30/2014 0253       Studies/Results: No results found.  Anti-infectives: Anti-infectives    None       Assessment/Plan LGI bleed secondary to rectal polyp/mass distal to anastomosis site S/p subtotal abdominal colectomy for multiple polyps for high grade dysplasia 10/05/2014 -Patient had colonoscopy on 11/08/14 Dr. Hilarie Fredrickson that reveals another polyp that is friable and the source of bleeding in his rectum. This is more distal that our anastomosis. -He had follow up with Dr. 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 Dr. Marcello Moores recommends OP evaluation, will arrange for follow up. -Hgb 10.3 which is stable -Would recommend continuing to hold anticoagulation given his recurrent issues with bleeding -Protein supplements -Check PT/INR - PT was last 18.5 and INR 1.54 on 11/08/14 -Ordered imodium prn and fiber supplement to help with diarrhea.  4-16mg /day and titrate until he has only 1-2 bm's per day, continue this at discharge -Advance to soft diet (continue this at discharge), no further bloody BM's, Hgb stable, d/c home today if okay from medical standpoint.  1. 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 SEE COMMENT. - CECAL TUBULAR ADENOMA (1.1CM) NEGATIVE FOR HIGH GRADE DYSPLASIA AND INVASIVE MALIGNANCY - RECTAL PREVIOUS BIOPSY SITE, NEGATIVE FOR DYSPLASIA OR MALIGNANCY. - OMENTAL SOFT TISSUE INVOLVEMENT BY LOW GRADE NON-HODGKIN'S B CELL LYMPHOMA, MARGINAL ZONE TYPE - TWENTY-SIX LYMPH NODES, NEGATIVE FOR METASTATIC TUMOR (0/26). - ENDOSCOPIC TATTOO INK IDENTIFIED. - COLORECTAL ULCERATIONS WITH ISCHEMIC INJURY AND KAYEXALATE DEPOSITS. - SURGICAL  MARGINS, NEGATIVE FOR EPITHELIAL DYSPLASIA OR MALIGNANCY. 2. Colon, resection margin (donut), distal - BENIGN COLON. - NEGATIVE FOR EPITHELIAL DYSPLASIA OR MALIGNANCY.    LOS: 2 days    Nat Christen 11/16/2014, 8:03 AM Pager: [phone]

## 2014-11-16 NOTE — Progress Notes (Signed)
Pt stable on telemetry for 24 hours. Afib rate controlled. Pt will not leave telemetry leads on. MD gave order to d/c tele.

## 2014-11-16 NOTE — Progress Notes (Signed)
Patient constantly taking the telemetry leads off his chest. He wont comply with our instruction to not take it off. He don't seem to understand well everytime we talk to him about his telemetry. Requested the central tele monitor tech to keep his telemetry on standby, K. Schorr was made aware.

## 2014-11-17 ENCOUNTER — Telehealth: Payer: Self-pay | Admitting: Hematology

## 2014-11-17 MED ORDER — HYDROCOD POLST-CPM POLST ER 10-8 MG/5ML PO SUER: 5.0000 mL | Freq: Once | ORAL | Status: DC

## 2014-11-17 NOTE — Telephone Encounter (Signed)
per pof to sch pt appt-[facility] w/[name] to [facility] and she stated they will call pt after it is cleared/she r/s CT-Cld pt and gave nurse pt appt time & date/location-stated she understood

## 2014-11-17 NOTE — Progress Notes (Signed)
Discharge report called to [place] at [facility] [phone]), no questions asked. Pt to be transported via EMS

## 2014-11-17 NOTE — Care Management Note (Signed)
Case Management Note  Patient Details  Name: CATO LIBURD MRN: [id number] Date of Birth: 04/18/1946  Subjective/Objective:  Awaiting to confirm from [facility] on the dme ordered, if they can provide. [facility] already set up.                  Action/Plan:d/c plan returning back to ALF-[facility].   Expected Discharge Date:   (unknown)               Expected Discharge Plan:  Assisted Living / Rest Home  In-House Referral:  Clinical Social Work  Discharge planning Services  CM Consult  Post Acute Care Choice:    Choice offered to:     DME Arranged:  3-N-1, Wheelchair manual (w/c w/cushion.Patient already had a rw within 37yrs,& has a current rw.) DME Agency:     [place]:    [place]:     Status of Service:  Completed, signed off  Medicare Important Message Given:    Date Medicare IM Given:  11/17/14 Medicare IM give by:  Dessa Phi Date Additional Medicare IM Given:    Additional Medicare Important Message give by:     If discussed at [place] of Stay Meetings, dates discussed:    Additional Comments:  Dessa Phi, RN 11/17/2014, 10:56 AM

## 2014-11-17 NOTE — Progress Notes (Signed)
Patient is set to discharge back to [facility] ALF today. Patient & facility aware. Discharge packet given to RN, Marissa. [facility] called for transport.     Raynaldo Opitz, [facility] Clinical Social Worker cell #: [phone]

## 2014-11-19 ENCOUNTER — Emergency Department (HOSPITAL_COMMUNITY)
Admission: EM | Admit: 2014-11-19 | Discharge: 2014-11-19 | Disposition: A | Discharge status: Home / Self Care | Payer: Medicare Other | Attending: Emergency Medicine | Admitting: Emergency Medicine

## 2014-11-19 ENCOUNTER — Encounter (HOSPITAL_COMMUNITY): Payer: Self-pay | Admitting: Emergency Medicine

## 2014-11-19 DIAGNOSIS — Z7901 Long term (current) use of anticoagulants: Secondary | ICD-10-CM | POA: Diagnosis not present

## 2014-11-19 DIAGNOSIS — F329 Major depressive disorder, single episode, unspecified: Secondary | ICD-10-CM | POA: Diagnosis not present

## 2014-11-19 DIAGNOSIS — K625 Hemorrhage of anus and rectum: Secondary | ICD-10-CM | POA: Diagnosis present

## 2014-11-19 DIAGNOSIS — Z7982 Long term (current) use of aspirin: Secondary | ICD-10-CM | POA: Insufficient documentation

## 2014-11-19 DIAGNOSIS — Z87828 Personal history of other (healed) physical injury and trauma: Secondary | ICD-10-CM | POA: Insufficient documentation

## 2014-11-19 DIAGNOSIS — I4891 Unspecified atrial fibrillation: Secondary | ICD-10-CM | POA: Insufficient documentation

## 2014-11-19 DIAGNOSIS — Z720 Tobacco use: Secondary | ICD-10-CM | POA: Insufficient documentation

## 2014-11-19 DIAGNOSIS — N400 Enlarged prostate without lower urinary tract symptoms: Secondary | ICD-10-CM | POA: Diagnosis not present

## 2014-11-19 DIAGNOSIS — E785 Hyperlipidemia, unspecified: Secondary | ICD-10-CM | POA: Insufficient documentation

## 2014-11-19 DIAGNOSIS — Z79899 Other long term (current) drug therapy: Secondary | ICD-10-CM | POA: Insufficient documentation

## 2014-11-19 DIAGNOSIS — Z8739 Personal history of other diseases of the musculoskeletal system and connective tissue: Secondary | ICD-10-CM | POA: Insufficient documentation

## 2014-11-19 LAB — TYPE AND SCREEN
ABO/RH(D): O POS
ANTIBODY SCREEN: NEGATIVE

## 2014-11-19 LAB — COMPREHENSIVE METABOLIC PANEL
ALK PHOS: 90 U/L (ref 38–126)
ALT: 12 U/L — ABNORMAL LOW (ref 17–63)
AST: 15 U/L (ref 15–41)
Albumin: 3.1 g/dL — ABNORMAL LOW (ref 3.5–5.0)
Anion gap: 11 (ref 5–15)
BILIRUBIN TOTAL: 0.1 mg/dL — AB (ref 0.3–1.2)
BUN: 21 mg/dL — ABNORMAL HIGH (ref 6–20)
CHLORIDE: 102 mmol/L (ref 101–111)
CO2: 24 mmol/L (ref 22–32)
CREATININE: 0.67 mg/dL (ref 0.61–1.24)
Calcium: 8.6 mg/dL — ABNORMAL LOW (ref 8.9–10.3)
GFR calc Af Amer: >60 mL/min (ref >60)
GFR calc non Af Amer: >60 mL/min (ref >60)
Glucose, Bld: 112 mg/dL — ABNORMAL HIGH (ref 65–99)
Potassium: 3.6 mmol/L (ref 3.5–5.1)
Sodium: 137 mmol/L (ref 135–145)
Total Protein: 6.3 g/dL — ABNORMAL LOW (ref 6.5–8.1)

## 2014-11-19 LAB — CBC
HEMATOCRIT: 29.9 % — AB (ref 39.0–52.0)
HEMATOCRIT: 30 % — AB (ref 39.0–52.0)
Hemoglobin: 9.5 g/dL — ABNORMAL LOW (ref 13.0–17.0)
Hemoglobin: 9.7 g/dL — ABNORMAL LOW (ref 13.0–17.0)
MCH: 30.8 pg (ref 26.0–34.0)
MCH: 31.3 pg (ref 26.0–34.0)
MCHC: 31.8 g/dL (ref 30.0–36.0)
MCHC: 32.3 g/dL (ref 30.0–36.0)
MCV: 97.1 fL (ref 78.0–100.0)
MCV: 96.8 fL (ref 78.0–100.0)
PLATELETS: 138 10*3/uL — AB (ref 150–400)
Platelets: 134 10*3/uL — ABNORMAL LOW (ref 150–400)
RBC: 3.08 MIL/uL — ABNORMAL LOW (ref 4.22–5.81)
RBC: 3.1 MIL/uL — AB (ref 4.22–5.81)
RDW: 17.4 % — AB (ref 11.5–15.5)
RDW: 17.4 % — AB (ref 11.5–15.5)
WBC: 5.6 10*3/uL (ref 4.0–10.5)
WBC: 5.3 10*3/uL (ref 4.0–10.5)

## 2014-11-19 NOTE — ED Notes (Signed)
Pt stable, ambulatory, states understanding of discharge instructions 

## 2014-11-19 NOTE — ED Provider Notes (Addendum)
CSN: [id number]     Arrival date & time 11/19/14  0113 History  This chart was scribed for Damon Biles, MD by [name], ED Scribe. This patient was seen in room D30C/D30C and the patient's care was started at 3:21 AM.   Chief Complaint  Patient presents with  . Rectal Bleeding   The history is provided by the patient. No language interpreter was used.    HPI Comments: [name] is a 69 y.o. male with a PMHx of gout, GI bleeding, benign prostatic hyperplasia, hyperlipidemia, appendectomy and cholecystectomy, who presents to the Emergency Department complaining of moderate active GI bleeding. Patient states that he was discharged 2 days ago. He reports he has been bleeding dark red blood with some clots since then. He states that he had an episode of bloody bowel movement at 4PM with about a "cup full amount" of blood. He states that he has had 4 bloody bowel movements since 4PM. His most recent episode of bloody stool was around 12AM. He also reports associated sharp periumbilical pain. Patient has episodes of GI bleed due to large rectal mass. He denies associated fevers or chills.  Past Medical History  Diagnosis Date  . Depression   . Hypomagnesemia   . Fall at nursing home 05/27/2013    "slipped on water; said I broke my right hip" (05/27/2013)  . Migraine     "used to have them bad; haven't had one in awhile" (05/27/2013)  . Gout attack     "not too long ago; in my right foot" (05/27/2013)  . Complete heart block     reason for pacemaker/notes 05/27/2013  . Atrial fibrillation     [name] 05/27/2013  . HLD (hyperlipidemia)   . GIB (gastrointestinal bleeding)   . BPH (benign prostatic hyperplasia)    Past Surgical History  Procedure Laterality Date  . Insert / replace / remove pacemaker    . Appendectomy    . Cholecystectomy    . Open reduction of hip Right 05/28/2013    Procedure: OPEN REDUCTION OF HIP;  Surgeon: Renette Butters, MD;  Location: [place];  Service:  Orthopedics;  Laterality: Right;  . Esophagogastroduodenoscopy (egd) with propofol N/A 09/26/2014    Procedure: ESOPHAGOGASTRODUODENOSCOPY (EGD) WITH PROPOFOL;  Surgeon: Jerene Bears, MD;  Location: [facility] ENDOSCOPY;  Service: Endoscopy;  Laterality: N/A;  . Colonoscopy N/A 10/01/2014    Procedure: COLONOSCOPY;  Surgeon: Jerene Bears, MD;  Location: [facility] ENDOSCOPY;  Service: Endoscopy;  Laterality: N/A;  . Partial colectomy N/A 10/05/2014    Procedure: SUBTOTAL COLECTOMY WITH ILEORECTAL ANASTOMOSIS;  Surgeon: Donnie Mesa, MD;  Location: [place];  Service: General;  Laterality: N/A;  . Flexible sigmoidoscopy N/A 11/07/2014    Procedure: FLEXIBLE SIGMOIDOSCOPY;  Surgeon: Jerene Bears, MD;  Location: [facility] ENDOSCOPY;  Service: Endoscopy;  Laterality: N/A;   Family History  Problem Relation Age of Onset  . Colon cancer Mother    History  Substance Use Topics  . Smoking status: Current Some Day Smoker -- 0.25 packs/day for 61 years    Types: Cigarettes  . Smokeless tobacco: Former Systems developer    Types: Snuff, Chew     Comment: 05/27/2013 "haven't used chew or snuff in ~ 30 yr"  . Alcohol Use: No     Comment: 05/27/2013 "none in years; never had problem w/it" - not in 30 years   Review of Systems  Gastrointestinal: Positive for abdominal pain and blood in stool.  All other systems reviewed  and are negative.  Allergies  Review of patient's allergies indicates no known allergies.  Home Medications   Prior to Admission medications   Medication Sig Start Date End Date Taking? Authorizing Provider  aspirin 81 MG chewable tablet Chew 81 mg by mouth daily.    Historical Provider, MD  Calcium Carbonate-Vit D-Min (CALTRATE 600+D PLUS MINERALS) 600-800 MG-UNIT TABS Take 1 tablet by mouth daily.    Historical Provider, MD  carvedilol (COREG) 12.5 MG tablet Take 12.5 mg by mouth 2 (two) times daily with a meal.    Historical Provider, MD  carvedilol (COREG) 6.25 MG tablet Take 1 tablet (6.25 mg total) by mouth 2  (two) times daily with a meal. Patient not taking: Reported on 11/29/2014 11/16/14   Velvet Bathe, MD  ciprofloxacin (CIPRO) 500 MG tablet Take 1 tablet (500 mg total) by mouth 2 (two) times daily. One po bid x 7 days 11/29/14   Veryl Speak, MD  docusate sodium (COLACE) 100 MG capsule Take 100 mg by mouth 2 (two) times daily.    Historical Provider, MD  doxazosin (CARDURA) 4 MG tablet Take 4 mg by mouth daily.    Historical Provider, MD  feeding supplement, ENSURE, (ENSURE) PUDG Take 1 Container by mouth 3 (three) times daily between meals. Patient not taking: Reported on 11/29/2014 11/09/14   Modena Jansky, MD  lisinopril (PRINIVIL,ZESTRIL) 5 MG tablet Take 5 mg by mouth daily.    Historical Provider, MD  loperamide (IMODIUM) 2 MG capsule Take 1 capsule (2 mg total) by mouth as needed for diarrhea or loose stools (you may give 2mg  of imodium after each loose stool, up to 16 mg total). Patient not taking: Reported on 11/29/2014 10/17/14   Domenic Polite, MD  magnesium oxide (MAG-OX) 400 MG tablet Take 400 mg by mouth 2 (two) times daily.    Historical Provider, MD  metroNIDAZOLE (FLAGYL) 500 MG tablet Take 1 tablet (500 mg total) by mouth 3 (three) times daily. One po bid x 7 days 11/29/14   Veryl Speak, MD  polycarbophil (FIBERCON) 625 MG tablet Take 1 tablet (625 mg total) by mouth daily. Patient not taking: Reported on 11/29/2014 11/09/14   Modena Jansky, MD  pravastatin (PRAVACHOL) 10 MG tablet Take 10 mg by mouth at bedtime.    Historical Provider, MD  rivaroxaban (XARELTO) 20 MG TABS tablet Take 20 mg by mouth daily.    Historical Provider, MD  sertraline (ZOLOFT) 50 MG tablet Take 50 mg by mouth every morning.     Historical Provider, MD  traMADol (ULTRAM) 50 MG tablet Take 1 tablet (50 mg total) by mouth every 6 (six) hours as needed for moderate pain or severe pain. Patient taking differently: Take 50 mg by mouth 2 (two) times daily.  11/09/14   Modena Jansky, MD  zolpidem (AMBIEN) 5 MG tablet  Take 5 mg by mouth at bedtime.    Historical Provider, MD   Triage Vitals: BP 120/67 mmHg  Pulse 83  Temp(Src) 98.8 F (37.1 C) (Oral)  Resp 16  SpO2 98%  Physical Exam  Constitutional: He is oriented to person, place, and time. He appears well-developed.  HENT:  Head: Normocephalic and atraumatic.  Eyes: Conjunctivae and EOM are normal. Pupils are equal, round, and reactive to light.  Neck: Normal range of motion. Neck supple.  Cardiovascular: Normal rate and regular rhythm.   Pulmonary/Chest: Effort normal and breath sounds normal.  Abdominal: Soft. Bowel sounds are normal. He exhibits no distension. There  is no tenderness. There is no rebound and no guarding.  Neurological: He is alert and oriented to person, place, and time.  Skin: Skin is warm.  Nursing note and vitals reviewed.  ED Course  Procedures (including critical care time)  DIAGNOSTIC STUDIES: Oxygen Saturation is 98% on RA, normal by my interpretation.    COORDINATION OF CARE: 3:48 AM- Discussed plans to order diagnostic lab work. Pt advised of plan for treatment and pt agrees.  Labs Review Labs Reviewed  CBC - Abnormal; Notable for the following:    RBC 3.08 (*)    Hemoglobin 9.5 (*)    HCT 29.9 (*)    RDW 17.4 (*)    Platelets 138 (*)    All other components within normal limits  COMPREHENSIVE METABOLIC PANEL - Abnormal; Notable for the following:    Glucose, Bld 112 (*)    BUN 21 (*)    Calcium 8.6 (*)    Total Protein 6.3 (*)    Albumin 3.1 (*)    ALT 12 (*)    Total Bilirubin 0.1 (*)    All other components within normal limits  CBC - Abnormal; Notable for the following:    RBC 3.10 (*)    Hemoglobin 9.7 (*)    HCT 30.0 (*)    RDW 17.4 (*)    Platelets 134 (*)    All other components within normal limits  TYPE AND SCREEN   Imaging Review Ct Abdomen Pelvis W Contrast  11/29/2014   CLINICAL DATA:  Acute onset of epigastric abdominal pain and blood in stool. Initial encounter.  EXAM: CT  ABDOMEN AND PELVIS WITH CONTRAST  TECHNIQUE: Multidetector CT imaging of the abdomen and pelvis was performed using the standard protocol following bolus administration of intravenous contrast.  CONTRAST:  57mL OMNIPAQUE IOHEXOL 300 MG/ML  SOLN  COMPARISON:  CT of the abdomen and pelvis performed 09/30/2014  FINDINGS: The visualized lung bases are clear. A right ventricular lead is partially imaged.  Mild prominence of the intrahepatic biliary ducts is within normal limits status post cholecystectomy. Clips are noted at the gallbladder fossa. The gallbladder is within normal limits. The pancreas and adrenal glands are unremarkable.  The kidneys are unremarkable in appearance. There is no evidence of hydronephrosis. No renal or ureteral stones are seen. No perinephric stranding is appreciated.  No free fluid is identified. The small bowel is unremarkable in appearance. The stomach is within normal limits. No acute vascular abnormalities are seen.  Diffuse calcification is noted along the abdominal aorta and its branches, including at the proximal renal arteries and along the superior mesenteric artery. There appears to be marked luminal narrowing along the right common iliac artery, though there is reconstitution of flow within the right external iliac artery.  The patient is status post appendectomy. Much of the colon has been resected, with the ileocolic anastomosis at the sigmoid colon. Vague soft tissue edema is suggested about the remaining sigmoid colon, possibly reflecting a mild infectious or inflammatory process. Contrast progresses to the level of the rectum.  The bladder is decompressed and not well assessed. Bladder wall thickening is nonspecific. The prostate remains normal in size. No inguinal lymphadenopathy is seen.  No acute osseous abnormalities are identified. An intramedullary rod and screw are noted within the proximal right femur.  IMPRESSION: 1. Vague soft tissue edema suggested about the  remaining sigmoid colon, possibly reflecting a mild infectious or inflammatory process. Ileocolic anastomosis is unremarkable in appearance. 2. Nonspecific bladder wall  thickening. Would correlate for any evidence of cystitis. 3. Diffuse calcification along the abdominal aorta and its branches, including at the proximal renal arteries and along the superior mesenteric artery. Apparent marked luminal narrowing along the right common iliac artery, though there is reconstitution of flow within the right external iliac artery.   Electronically Signed   By: Garald Balding M.D.   On: 11/29/2014 06:25     EKG Interpretation None     MDM   Final diagnoses:  Rectal bleed    Pt comes in with rectal bleed. He has known lower GI bleed hx secondary to lesion. Pt just discharged from the hospital, he had a GI evaluation, and was provided Gen Surgery f/u.  We decided to get serial CBC - which remained stable. Strict return precautions discussed - but with known source of bleed, that doesn't require acute intervention, stable CBC, stable vitals that are WNL and normal monitoring in the ER - we feel comfortable sending patient home with the already established outpatient workup.   Damon Biles, MD 11/21/14 [place], MD 11/30/14 [id number]  Damon Biles, MD 11/30/14 [id number]

## 2014-11-19 NOTE — Discharge Instructions (Signed)

## 2014-11-19 NOTE — ED Notes (Signed)
PTAR CALLED @ 0503.

## 2014-11-19 NOTE — ED Notes (Signed)
Patient with history of rectal bleeding, having more bleeding and rectal pain at this time.  Patient has followed up with surgeon and needs to have abdominal surgery in 5 months.  Patient is CAOx4 upon arrival.

## 2014-11-21 ENCOUNTER — Telehealth: Payer: Self-pay | Admitting: Hematology

## 2014-11-21 ENCOUNTER — Telehealth (HOSPITAL_COMMUNITY): Payer: Self-pay

## 2014-11-21 ENCOUNTER — Other Ambulatory Visit: Payer: Self-pay | Admitting: Hematology

## 2014-11-21 NOTE — Telephone Encounter (Signed)
cld [employer] where pt lives * spoke to [place] and gave r/s appt time & date of appt-

## 2014-11-23 ENCOUNTER — Ambulatory Visit (HOSPITAL_COMMUNITY)
Admit: 2014-11-23 | Discharge: 2014-11-23 | Disposition: A | Discharge status: Home / Self Care | Payer: Medicare Other | Source: Ambulatory Visit | Attending: Hematology | Admitting: Hematology

## 2014-11-23 ENCOUNTER — Encounter (HOSPITAL_COMMUNITY): Payer: Self-pay

## 2014-11-23 DIAGNOSIS — C8510 Unspecified B-cell lymphoma, unspecified site: Secondary | ICD-10-CM | POA: Diagnosis not present

## 2014-11-23 DIAGNOSIS — Z950 Presence of cardiac pacemaker: Secondary | ICD-10-CM | POA: Diagnosis present

## 2014-11-23 DIAGNOSIS — J432 Centrilobular emphysema: Secondary | ICD-10-CM | POA: Insufficient documentation

## 2014-11-23 DIAGNOSIS — E279 Disorder of adrenal gland, unspecified: Secondary | ICD-10-CM | POA: Insufficient documentation

## 2014-11-23 DIAGNOSIS — C8580 Other specified types of non-Hodgkin lymphoma, unspecified site: Secondary | ICD-10-CM

## 2014-11-23 MED ORDER — IOHEXOL 300 MG/ML  SOLN
100.0000 mL | Freq: Once | INTRAMUSCULAR | Status: AC | PRN
  Administered 2014-11-23: 60 mL via INTRAVENOUS

## 2014-11-24 ENCOUNTER — Encounter (HOSPITAL_COMMUNITY): Payer: Self-pay | Admitting: Emergency Medicine

## 2014-11-24 ENCOUNTER — Emergency Department (HOSPITAL_COMMUNITY): Payer: Medicare Other

## 2014-11-24 ENCOUNTER — Emergency Department (HOSPITAL_COMMUNITY)
Admission: EM | Admit: 2014-11-24 | Discharge: 2014-11-24 | Disposition: A | Discharge status: Home / Self Care | Payer: Medicare Other | Attending: Emergency Medicine | Admitting: Emergency Medicine

## 2014-11-24 DIAGNOSIS — Z87891 Personal history of nicotine dependence: Secondary | ICD-10-CM | POA: Insufficient documentation

## 2014-11-24 DIAGNOSIS — W19XXXA Unspecified fall, initial encounter: Secondary | ICD-10-CM

## 2014-11-24 DIAGNOSIS — Y9389 Activity, other specified: Secondary | ICD-10-CM | POA: Insufficient documentation

## 2014-11-24 DIAGNOSIS — M545 Low back pain, unspecified: Secondary | ICD-10-CM

## 2014-11-24 DIAGNOSIS — Y998 Other external cause status: Secondary | ICD-10-CM | POA: Insufficient documentation

## 2014-11-24 DIAGNOSIS — N400 Enlarged prostate without lower urinary tract symptoms: Secondary | ICD-10-CM | POA: Insufficient documentation

## 2014-11-24 DIAGNOSIS — F329 Major depressive disorder, single episode, unspecified: Secondary | ICD-10-CM | POA: Diagnosis not present

## 2014-11-24 DIAGNOSIS — Z79899 Other long term (current) drug therapy: Secondary | ICD-10-CM | POA: Insufficient documentation

## 2014-11-24 DIAGNOSIS — W01198A Fall on same level from slipping, tripping and stumbling with subsequent striking against other object, initial encounter: Secondary | ICD-10-CM | POA: Diagnosis not present

## 2014-11-24 DIAGNOSIS — Y92002 Bathroom of unspecified non-institutional (private) residence single-family (private) house as the place of occurrence of the external cause: Secondary | ICD-10-CM | POA: Diagnosis not present

## 2014-11-24 DIAGNOSIS — S3992XA Unspecified injury of lower back, initial encounter: Secondary | ICD-10-CM | POA: Insufficient documentation

## 2014-11-24 DIAGNOSIS — Z8739 Personal history of other diseases of the musculoskeletal system and connective tissue: Secondary | ICD-10-CM | POA: Insufficient documentation

## 2014-11-24 DIAGNOSIS — I4891 Unspecified atrial fibrillation: Secondary | ICD-10-CM | POA: Insufficient documentation

## 2014-11-24 DIAGNOSIS — E785 Hyperlipidemia, unspecified: Secondary | ICD-10-CM | POA: Insufficient documentation

## 2014-11-24 DIAGNOSIS — Z8719 Personal history of other diseases of the digestive system: Secondary | ICD-10-CM | POA: Insufficient documentation

## 2014-11-24 DIAGNOSIS — R42 Dizziness and giddiness: Secondary | ICD-10-CM

## 2014-11-24 LAB — URINALYSIS, ROUTINE W REFLEX MICROSCOPIC
Bilirubin Urine: NEGATIVE
Glucose, UA: NEGATIVE mg/dL
Hgb urine dipstick: NEGATIVE
KETONES UR: NEGATIVE mg/dL
Leukocytes, UA: NEGATIVE
Nitrite: NEGATIVE
Protein, ur: NEGATIVE mg/dL
SPECIFIC GRAVITY, URINE: 1.026 (ref 1.005–1.030)
Urobilinogen, UA: 0.2 mg/dL (ref 0.0–1.0)
pH: 5.5 (ref 5.0–8.0)

## 2014-11-24 LAB — CBC WITH DIFFERENTIAL/PLATELET
BASOS ABS: 0 10*3/uL (ref 0.0–0.1)
BASOS PCT: 0 % (ref 0–1)
Eosinophils Absolute: 0.1 10*3/uL (ref 0.0–0.7)
Eosinophils Relative: 1 % (ref 0–5)
HEMATOCRIT: 32.9 % — AB (ref 39.0–52.0)
Hemoglobin: 10.4 g/dL — ABNORMAL LOW (ref 13.0–17.0)
Lymphocytes Relative: 35 % (ref 12–46)
Lymphs Abs: 2.7 10*3/uL (ref 0.7–4.0)
MCH: 30.3 pg (ref 26.0–34.0)
MCHC: 31.6 g/dL (ref 30.0–36.0)
MCV: 95.9 fL (ref 78.0–100.0)
MONO ABS: 0.6 10*3/uL (ref 0.1–1.0)
Monocytes Relative: 8 % (ref 3–12)
NEUTROS ABS: 4.4 10*3/uL (ref 1.7–7.7)
Neutrophils Relative %: 56 % (ref 43–77)
PLATELETS: 197 10*3/uL (ref 150–400)
RBC: 3.43 MIL/uL — ABNORMAL LOW (ref 4.22–5.81)
RDW: 17.1 % — ABNORMAL HIGH (ref 11.5–15.5)
WBC: 7.9 10*3/uL (ref 4.0–10.5)

## 2014-11-24 LAB — COMPREHENSIVE METABOLIC PANEL
ALK PHOS: 92 U/L (ref 38–126)
ALT: 10 U/L — AB (ref 17–63)
AST: 14 U/L — ABNORMAL LOW (ref 15–41)
Albumin: 3.2 g/dL — ABNORMAL LOW (ref 3.5–5.0)
Anion gap: 11 (ref 5–15)
BILIRUBIN TOTAL: 0.4 mg/dL (ref 0.3–1.2)
BUN: 19 mg/dL (ref 6–20)
CO2: 23 mmol/L (ref 22–32)
Calcium: 8.8 mg/dL — ABNORMAL LOW (ref 8.9–10.3)
Chloride: 104 mmol/L (ref 101–111)
Creatinine, Ser: 0.66 mg/dL (ref 0.61–1.24)
GLUCOSE: 129 mg/dL — AB (ref 65–99)
Potassium: 4.2 mmol/L (ref 3.5–5.1)
Sodium: 138 mmol/L (ref 135–145)
Total Protein: 6.9 g/dL (ref 6.5–8.1)

## 2014-11-24 LAB — TROPONIN I: Troponin I: <0.03 ng/mL (ref <0.031)

## 2014-11-24 MED ORDER — HYDROCODONE-ACETAMINOPHEN 5-325 MG PO TABS
1.0000 | ORAL_TABLET | Freq: Once | ORAL | Status: AC
  Administered 2014-11-24: 1 via ORAL
  Filled 2014-11-24: qty 1

## 2014-11-24 NOTE — Discharge Instructions (Signed)
Back Pain, Adult Low back pain is very common. About 1 in 5 people have back pain.The cause of low back pain is rarely dangerous. The pain often gets better over time.About half of people with a sudden onset of back pain feel better in just 2 weeks. About 8 in 10 people feel better by 6 weeks.  CAUSES Some common causes of back pain include:  Strain of the muscles or ligaments supporting the spine.  Wear and tear (degeneration) of the spinal discs.  Arthritis.  Direct injury to the back. DIAGNOSIS Most of the time, the direct cause of low back pain is not known.However, back pain can be treated effectively even when the exact cause of the pain is unknown.Answering your caregiver's questions about your overall health and symptoms is one of the most accurate ways to make sure the cause of your pain is not dangerous. If your caregiver needs more information, he or she may order lab work or imaging tests (X-rays or MRIs).However, even if imaging tests show changes in your back, this usually does not require surgery. HOME CARE INSTRUCTIONS For many people, back pain returns.Since low back pain is rarely dangerous, it is often a condition that people can learn to [facility] their own.   Remain active. It is stressful on the back to sit or stand in one place. Do not sit, drive, or stand in one place for more than 30 minutes at a time. Take short walks on level surfaces as soon as pain allows.Try to increase the length of time you walk each day.  Do not stay in bed.Resting more than 1 or 2 days can delay your recovery.  Do not avoid exercise or work.Your body is made to move.It is not dangerous to be active, even though your back may hurt.Your back will likely heal faster if you return to being active before your pain is gone.  Pay attention to your body when you bend and lift. Many people have less discomfortwhen lifting if they bend their knees, keep the load close to their bodies,and  avoid twisting. Often, the most comfortable positions are those that put less stress on your recovering back.  Find a comfortable position to sleep. Use a firm mattress and lie on your side with your knees slightly bent. If you lie on your back, put a pillow under your knees.  Only take over-the-counter or prescription medicines as directed by your caregiver. Over-the-counter medicines to reduce pain and inflammation are often the most helpful.Your caregiver may prescribe muscle relaxant drugs.These medicines help dull your pain so you can more quickly return to your normal activities and healthy exercise.  Put ice on the injured area.  Put ice in a plastic bag.  Place a towel between your skin and the bag.  Leave the ice on for 15-20 minutes, 03-04 times a day for the first 2 to 3 days. After that, ice and heat may be alternated to reduce pain and spasms.  Ask your caregiver about trying back exercises and gentle massage. This may be of some benefit.  Avoid feeling anxious or stressed.Stress increases muscle tension and can worsen back pain.It is important to recognize when you are anxious or stressed and learn ways to manage it.Exercise is a great option. SEEK MEDICAL CARE IF:  You have pain that is not relieved with rest or medicine.  You have pain that does not improve in 1 week.  You have new symptoms.  You are generally not feeling well. SEEK  IMMEDIATE MEDICAL CARE IF:   You have pain that radiates from your back into your legs.  You develop new bowel or bladder control problems.  You have unusual weakness or numbness in your arms or legs.  You develop nausea or vomiting.  You develop abdominal pain.  You feel faint. Document Released: 05/27/2005 Document Revised: 11/26/2011 Document Reviewed: 09/28/2013 [facility] Patient Information 2015 [place], [place]. This information is not intended to replace advice given to you by your health care provider. Make sure you  discuss any questions you have with your health care provider.  Dizziness Dizziness is a common problem. It is a feeling of unsteadiness or light-headedness. You may feel like you are about to faint. Dizziness can lead to injury if you stumble or fall. A person of any age group can suffer from dizziness, but dizziness is more common in older adults. CAUSES  Dizziness can be caused by many different things, including:  Middle ear problems.  Standing for too long.  Infections.  An allergic reaction.  Aging.  An emotional response to something, such as the sight of blood.  Side effects of medicines.  Tiredness.  Problems with circulation or blood pressure.  Excessive use of alcohol or medicines, or illegal drug use.  Breathing too fast (hyperventilation).  An irregular heart rhythm (arrhythmia).  A low red blood cell count (anemia).  Pregnancy.  Vomiting, diarrhea, fever, or other illnesses that cause body fluid loss (dehydration).  Diseases or conditions such as Parkinson's disease, high blood pressure (hypertension), diabetes, and thyroid problems.  Exposure to extreme heat. DIAGNOSIS  Your health care provider will ask about your symptoms, perform a physical exam, and perform an electrocardiogram (ECG) to record the electrical activity of your heart. Your health care provider may also perform other heart or blood tests to determine the cause of your dizziness. These may include:  Transthoracic echocardiogram (TTE). During echocardiography, sound waves are used to evaluate how blood flows through your heart.  Transesophageal echocardiogram (TEE).  Cardiac monitoring. This allows your health care provider to monitor your heart rate and rhythm in real time.  Holter monitor. This is a portable device that records your heartbeat and can help diagnose heart arrhythmias. It allows your health care provider to track your heart activity for several days if needed.  Stress  tests by exercise or by giving medicine that makes the heart beat faster. TREATMENT  Treatment of dizziness depends on the cause of your symptoms and can vary greatly. HOME CARE INSTRUCTIONS   Drink enough fluids to keep your urine clear or pale yellow. This is especially important in very hot weather. In older adults, it is also important in cold weather.  Take your medicine exactly as directed if your dizziness is caused by medicines. When taking blood pressure medicines, it is especially important to get up slowly.  Rise slowly from chairs and steady yourself until you feel okay.  In the morning, first sit up on the side of the bed. When you feel okay, stand slowly while holding onto something until you know your balance is fine.  Move your legs often if you need to stand in one place for a long time. Tighten and relax your muscles in your legs while standing.  Have someone stay with you for 1-2 days if dizziness continues to be a problem. Do this until you feel you are well enough to stay alone. Have the person call your health care provider if he or she notices changes in  you that are concerning.  Do not drive or use heavy machinery if you feel dizzy.  Do not drink alcohol. SEEK IMMEDIATE MEDICAL CARE IF:   Your dizziness or light-headedness gets worse.  You feel nauseous or vomit.  You have problems talking, walking, or using your arms, hands, or legs.  You feel weak.  You are not thinking clearly or you have trouble forming sentences. It may take a friend or family member to notice this.  You have chest pain, abdominal pain, shortness of breath, or sweating.  Your vision changes.  You notice any bleeding.  You have side effects from medicine that seems to be getting worse rather than better. MAKE SURE YOU:   Understand these instructions.  Will watch your condition.  Will get help right away if you are not doing well or get worse. Document Released: 11/20/2000  Document Revised: 06/01/2013 Document Reviewed: 12/14/2010 [facility] Patient Information 2015 [place], [place]. This information is not intended to replace advice given to you by your health care provider. Make sure you discuss any questions you have with your health care provider.

## 2014-11-24 NOTE — ED Provider Notes (Signed)
TIME SEEN: 5:55 AM  CHIEF COMPLAINT: Fall, back pain  HPI: Pt is a 69 y.o. male with history of atrial fibrillation status post pacemaker, hyperlipidemia, previous gastrointestinal bleed who presents to the emergency department with a fall at his nursing home. States that he felt "swimmy-headed" when he got up from bed to the bathroom. [place] [address] and hit his back. Denies hitting his head. No numbness, tingling or focal weakness. Denies chest pain, shortness of breath, palpitations that led to his fall. No symptoms currently.  Denies recent vomiting or diarrhea.  ROS: See HPI Constitutional: no fever  Eyes: no drainage  ENT: no runny nose   Cardiovascular:  no chest pain  Resp: no SOB  GI: no vomiting GU: no dysuria Integumentary: no rash  Allergy: no hives  Musculoskeletal: no leg swelling  Neurological: no slurred speech ROS otherwise negative  PAST MEDICAL HISTORY/PAST SURGICAL HISTORY:  Past Medical History  Diagnosis Date  . Depression   . Hypomagnesemia   . Fall at nursing home 05/27/2013    "slipped on water; said I broke my right hip" (05/27/2013)  . Migraine     "used to have them bad; haven't had one in awhile" (05/27/2013)  . Gout attack     "not too long ago; in my right foot" (05/27/2013)  . Complete heart block     reason for pacemaker/notes 05/27/2013  . Atrial fibrillation     [name] 05/27/2013  . HLD (hyperlipidemia)   . GIB (gastrointestinal bleeding)   . BPH (benign prostatic hyperplasia)     MEDICATIONS:  Prior to Admission medications   Medication Sig Start Date End Date Taking? Authorizing Provider  Calcium Carbonate-Vit D-Min (CALTRATE 600+D PLUS MINERALS) 600-800 MG-UNIT TABS Take 1 tablet by mouth daily.    Historical Provider, MD  carvedilol (COREG) 6.25 MG tablet Take 1 tablet (6.25 mg total) by mouth 2 (two) times daily with a meal. 11/16/14   Velvet Bathe, MD  feeding supplement, ENSURE, (ENSURE) PUDG Take 1 Container by mouth 3 (three) times daily  between meals. Patient not taking: Reported on 11/14/2014 11/09/14   Modena Jansky, MD  finasteride (PROSCAR) 5 MG tablet Take 5 mg by mouth every morning.    Historical Provider, MD  loperamide (IMODIUM) 2 MG capsule Take 1 capsule (2 mg total) by mouth as needed for diarrhea or loose stools (you may give 2mg  of imodium after each loose stool, up to 16 mg total). 10/17/14   Domenic Polite, MD  magnesium oxide (MAG-OX) 400 MG tablet Take 400 mg by mouth 2 (two) times daily.    Historical Provider, MD  polycarbophil (FIBERCON) 625 MG tablet Take 1 tablet (625 mg total) by mouth daily. Patient not taking: Reported on 11/14/2014 11/09/14   Modena Jansky, MD  pravastatin (PRAVACHOL) 10 MG tablet Take 10 mg by mouth at bedtime.    Historical Provider, MD  sertraline (ZOLOFT) 50 MG tablet Take 50 mg by mouth every morning.     Historical Provider, MD  traMADol (ULTRAM) 50 MG tablet Take 1 tablet (50 mg total) by mouth every 6 (six) hours as needed for moderate pain or severe pain. 11/09/14   Modena Jansky, MD  zolpidem (AMBIEN) 5 MG tablet Take 5 mg by mouth at bedtime.    Historical Provider, MD    ALLERGIES:  No Known Allergies  SOCIAL HISTORY:  History  Substance Use Topics  . Smoking status: Former Smoker -- 0.25 packs/day for 61 years    Types:  Cigarettes  . Smokeless tobacco: Former Systems developer    Types: Snuff, Chew     Comment: 05/27/2013 "haven't used chew or snuff in ~ 30 yr"  . Alcohol Use: Yes     Comment: 05/27/2013 "none in years; never had problem w/it" - not in 30 years    FAMILY HISTORY: Family History  Problem Relation Age of Onset  . Colon cancer Mother     EXAM: BP 124/71 mmHg  Pulse 84  Temp(Src) 98.6 F (37 C) (Oral)  Resp 23  SpO2 99% CONSTITUTIONAL: Alert and oriented and responds appropriately to questions. Elderly, in no distress, nontoxic, afebrile HEAD: Normocephalic, atraumatic EYES: Conjunctivae clear, PERRL ENT: normal nose; no rhinorrhea; moist mucous  membranes; pharynx without lesions noted, no septal hematoma or dental injury NECK: Supple, no meningismus, no LAD; no midline spinal tenderness or step-off or deformity CARD: RRR; S1 and S2 appreciated; no murmurs, no clicks, no rubs, no gallops RESP: Normal chest excursion without splinting or tachypnea; breath sounds clear and equal bilaterally; no wheezes, no rhonchi, no rales, no hypoxia or respiratory distress, speaking full sentences ABD/GI: Normal bowel sounds; non-distended; soft, non-tender, no rebound, no guarding, no peritoneal signs BACK:  The back appears normal and is tender over the lumbar paraspinal musculature and midline without step-off or deformity, no lesions noted on the back, no ecchymosis or swelling,, there is no CVA tenderness EXT: Normal ROM in all joints; non-tender to palpation; no edema; normal capillary refill; no cyanosis, no calf tenderness or swelling    SKIN: Normal color for age and race; warm NEURO: Moves all extremities equally, sensation to light touch intact diffusely, cranial nerves II through XII intact PSYCH: The patient's mood and manner are appropriate. Grooming and personal hygiene are appropriate.  MEDICAL DECISION MAKING: Patient here with a fall from feeling dizzy. Patient's labs ordered in triage are unremarkable. Troponin negative. Will obtain x-ray of his back, urinalysis. Will interrogate pacemaker.  ED PROGRESS: 7:14 AM  Pt's [employer] pacemaker has been interrogated. No arrhythmia, other issues to report.  X-ray shows no acute abnormality. Urine pending.  Anticipate discharge back to his nursing facility after urinalysis has resulted.     EKG Interpretation  Date/Time:  Thursday November 24 2014 04:58:04 EDT Ventricular Rate:  81 PR Interval:    QRS Duration: 108 QT Interval:  395 QTC Calculation: 458 R Axis:   -112 Text Interpretation:  Atrial fibrillation Right superior axis Anteroseptal infarct, old Baseline wander in lead(s) V4 No  significant change since last tracing Confirmed by WARD,  DO, KRISTEN [phone]) on 11/24/2014 6:03:27 AM        [place], DO 11/24/14 7106

## 2014-11-24 NOTE — ED Notes (Signed)
Pt. arrived with EMS from a nursing home , found by staff on the floor inside the bathroom , pt. stated that he lost his balance and fell , no LOC , ambulatory , reports pain at lower back . Alert /respirations unlabored.

## 2014-11-24 NOTE — ED Notes (Signed)
Urinal within reach; pt instructed to provide urine sample

## 2014-11-25 ENCOUNTER — Emergency Department (HOSPITAL_COMMUNITY)
Admission: EM | Admit: 2014-11-25 | Discharge: 2014-11-25 | Disposition: A | Discharge status: Home / Self Care | Payer: Medicare Other | Attending: Emergency Medicine | Admitting: Emergency Medicine

## 2014-11-25 ENCOUNTER — Encounter (HOSPITAL_COMMUNITY): Payer: Self-pay | Admitting: Family Medicine

## 2014-11-25 ENCOUNTER — Emergency Department (HOSPITAL_COMMUNITY): Payer: Medicare Other

## 2014-11-25 DIAGNOSIS — I4891 Unspecified atrial fibrillation: Secondary | ICD-10-CM | POA: Diagnosis not present

## 2014-11-25 DIAGNOSIS — Z8669 Personal history of other diseases of the nervous system and sense organs: Secondary | ICD-10-CM | POA: Insufficient documentation

## 2014-11-25 DIAGNOSIS — Z87891 Personal history of nicotine dependence: Secondary | ICD-10-CM | POA: Insufficient documentation

## 2014-11-25 DIAGNOSIS — Z8719 Personal history of other diseases of the digestive system: Secondary | ICD-10-CM | POA: Insufficient documentation

## 2014-11-25 DIAGNOSIS — Z79899 Other long term (current) drug therapy: Secondary | ICD-10-CM | POA: Insufficient documentation

## 2014-11-25 DIAGNOSIS — R079 Chest pain, unspecified: Secondary | ICD-10-CM

## 2014-11-25 DIAGNOSIS — F329 Major depressive disorder, single episode, unspecified: Secondary | ICD-10-CM | POA: Diagnosis not present

## 2014-11-25 DIAGNOSIS — E785 Hyperlipidemia, unspecified: Secondary | ICD-10-CM | POA: Insufficient documentation

## 2014-11-25 DIAGNOSIS — N400 Enlarged prostate without lower urinary tract symptoms: Secondary | ICD-10-CM | POA: Diagnosis not present

## 2014-11-25 DIAGNOSIS — R0789 Other chest pain: Secondary | ICD-10-CM | POA: Insufficient documentation

## 2014-11-25 DIAGNOSIS — Z8739 Personal history of other diseases of the musculoskeletal system and connective tissue: Secondary | ICD-10-CM | POA: Insufficient documentation

## 2014-11-25 LAB — CBC
HCT: 30.8 % — ABNORMAL LOW (ref 39.0–52.0)
Hemoglobin: 9.8 g/dL — ABNORMAL LOW (ref 13.0–17.0)
MCH: 30.3 pg (ref 26.0–34.0)
MCHC: 31.8 g/dL (ref 30.0–36.0)
MCV: 95.4 fL (ref 78.0–100.0)
Platelets: 195 10*3/uL (ref 150–400)
RBC: 3.23 MIL/uL — ABNORMAL LOW (ref 4.22–5.81)
RDW: 16.7 % — AB (ref 11.5–15.5)
WBC: 5.8 10*3/uL (ref 4.0–10.5)

## 2014-11-25 LAB — BASIC METABOLIC PANEL
Anion gap: 6 (ref 5–15)
BUN: 18 mg/dL (ref 6–20)
CO2: 26 mmol/L (ref 22–32)
Calcium: 8.4 mg/dL — ABNORMAL LOW (ref 8.9–10.3)
Chloride: 106 mmol/L (ref 101–111)
Creatinine, Ser: 0.66 mg/dL (ref 0.61–1.24)
GFR calc Af Amer: >60 mL/min (ref >60)
GLUCOSE: 99 mg/dL (ref 65–99)
Potassium: 3.9 mmol/L (ref 3.5–5.1)
Sodium: 138 mmol/L (ref 135–145)

## 2014-11-25 LAB — TROPONIN I: Troponin I: <0.03 ng/mL (ref <0.031)

## 2014-11-25 MED ORDER — SODIUM CHLORIDE 0.9 % IV BOLUS (SEPSIS)
1000.0000 mL | Freq: Once | INTRAVENOUS | Status: AC
  Administered 2014-11-25: 1000 mL via INTRAVENOUS

## 2014-11-25 NOTE — ED Notes (Signed)
Notified [facility] for transportation back home 

## 2014-11-25 NOTE — ED Notes (Signed)
Pt presents from [facility] assisted living via EMS with c/o left sided sharp chest pain.  Pt given 1 SL nitro PTA and pressure dropped from 115/64 to 83/42. Last BP was 128/70 from EMS. PT is Alert and interactive and in NAD.  Pt was in Afib/Aflutter with EMS and states he is usually in Afib.  Pt was on a blood thinner but had rectal bleeding 2 weeks ago and was taken off of it.

## 2014-11-25 NOTE — ED Provider Notes (Signed)
CSN: [id number]     Arrival date & time 11/25/14  9242 History   First MD Initiated Contact with Patient 11/25/14 [phone]     Chief Complaint  Patient presents with  . Chest Pain     (Consider location/radiation/quality/duration/timing/severity/associated sxs/prior Treatment) HPI Comments: The patient is a 69 year old male, he has multiple medical problems including frequent falls, a newly identified cancer which required colectomy in the last 2 months, ongoing GI bleeding, atrial flutter which is unable to be treated with anticoagulation secondary to history of GI bleeding, pacemaker placement (last interrogated last night after he presented with a fall) itches working normally. He states that this morning after eating [employer] he developed left-sided chest pain, it is a sharp and stabbing pain on the left side of his chest, persistent, does not have Better or worse with any position or deep breathing, it is not exertional, it is a pain that he may have had once or twice in the past. He denies having a heart attack, review of the medical record shows that he has not had any cardiac catheterizations or stress test that I can find in our adequate record, he was seen by cardiology during a recent admission to the hospital during which time they felt like the pacemaker was working appropriately, no ischemic evaluation was done at that time. The patient did present last night after he had a fall from standing when he got dizzy, his hemoglobin was higher than normal at 10.5, he had no other acute findings and was discharged home in a stable condition.  Patient is a 69 y.o. male presenting with chest pain. The history is provided by the patient.  Chest Pain   Past Medical History  Diagnosis Date  . Depression   . Hypomagnesemia   . Fall at nursing home 05/27/2013    "slipped on water; said I broke my right hip" (05/27/2013)  . Migraine     "used to have them bad; haven't had one in awhile" (05/27/2013)   . Gout attack     "not too long ago; in my right foot" (05/27/2013)  . Complete heart block     reason for pacemaker/notes 05/27/2013  . Atrial fibrillation     [name] 05/27/2013  . HLD (hyperlipidemia)   . GIB (gastrointestinal bleeding)   . BPH (benign prostatic hyperplasia)    Past Surgical History  Procedure Laterality Date  . Insert / replace / remove pacemaker    . Appendectomy    . Cholecystectomy    . Open reduction of hip Right 05/28/2013    Procedure: OPEN REDUCTION OF HIP;  Surgeon: Renette Butters, MD;  Location: [facility];  Service: Orthopedics;  Laterality: Right;  . Esophagogastroduodenoscopy (egd) with propofol N/A 09/26/2014    Procedure: ESOPHAGOGASTRODUODENOSCOPY (EGD) WITH PROPOFOL;  Surgeon: Jerene Bears, MD;  Location: [facility] ENDOSCOPY;  Service: Endoscopy;  Laterality: N/A;  . Colonoscopy N/A 10/01/2014    Procedure: COLONOSCOPY;  Surgeon: Jerene Bears, MD;  Location: [facility] ENDOSCOPY;  Service: Endoscopy;  Laterality: N/A;  . Partial colectomy N/A 10/05/2014    Procedure: SUBTOTAL COLECTOMY WITH ILEORECTAL ANASTOMOSIS;  Surgeon: Donnie Mesa, MD;  Location: [facility];  Service: General;  Laterality: N/A;  . Flexible sigmoidoscopy N/A 11/07/2014    Procedure: FLEXIBLE SIGMOIDOSCOPY;  Surgeon: Jerene Bears, MD;  Location: [facility] ENDOSCOPY;  Service: Endoscopy;  Laterality: N/A;   Family History  Problem Relation Age of Onset  . Colon cancer Mother    History  Substance  Use Topics  . Smoking status: Former Smoker -- 0.25 packs/day for 61 years    Types: Cigarettes  . Smokeless tobacco: Former Systems developer    Types: Snuff, Chew     Comment: 05/27/2013 "haven't used chew or snuff in ~ 30 yr"  . Alcohol Use: Yes     Comment: 05/27/2013 "none in years; never had problem w/it" - not in 30 years    Review of Systems  Cardiovascular: Positive for chest pain.  All other systems reviewed and are negative.     Allergies  Review of patient's allergies indicates no known  allergies.  Home Medications   Prior to Admission medications   Medication Sig Start Date End Date Taking? Authorizing Provider  Calcium Carbonate-Vit D-Min (CALTRATE 600+D PLUS MINERALS) 600-800 MG-UNIT TABS Take 1 tablet by mouth daily.   Yes Historical Provider, MD  carvedilol (COREG) 6.25 MG tablet Take 1 tablet (6.25 mg total) by mouth 2 (two) times daily with a meal. 11/16/14  Yes Velvet Bathe, MD  finasteride (PROSCAR) 5 MG tablet Take 5 mg by mouth every morning.   Yes Historical Provider, MD  loperamide (IMODIUM) 2 MG capsule Take 1 capsule (2 mg total) by mouth as needed for diarrhea or loose stools (you may give 2mg  of imodium after each loose stool, up to 16 mg total). 10/17/14  Yes Domenic Polite, MD  magnesium oxide (MAG-OX) 400 MG tablet Take 400 mg by mouth 2 (two) times daily.   Yes Historical Provider, MD  oxyCODONE-acetaminophen (PERCOCET/ROXICET) 5-325 MG per tablet Take 1 tablet by mouth every 4 (four) hours as needed for severe pain.   Yes Historical Provider, MD  pravastatin (PRAVACHOL) 10 MG tablet Take 10 mg by mouth at bedtime.   Yes Historical Provider, MD  sertraline (ZOLOFT) 50 MG tablet Take 50 mg by mouth every morning.    Yes Historical Provider, MD  traMADol (ULTRAM) 50 MG tablet Take 1 tablet (50 mg total) by mouth every 6 (six) hours as needed for moderate pain or severe pain. 11/09/14  Yes Modena Jansky, MD  zolpidem (AMBIEN) 5 MG tablet Take 5 mg by mouth at bedtime.   Yes Historical Provider, MD  feeding supplement, ENSURE, (ENSURE) PUDG Take 1 Container by mouth 3 (three) times daily between meals. 11/09/14   Modena Jansky, MD  polycarbophil (FIBERCON) 625 MG tablet Take 1 tablet (625 mg total) by mouth daily. 11/09/14   Modena Jansky, MD   BP 99/42 mmHg  Pulse 79  Temp(Src) 98.8 F (37.1 C) (Oral)  Resp 21  SpO2 100% Physical Exam  Constitutional: He appears well-developed and well-nourished. No distress.  HENT:  Head: Normocephalic and atraumatic.   Mouth/Throat: Oropharynx is clear and moist. No oropharyngeal exudate.  Eyes: Conjunctivae and EOM are normal. Pupils are equal, round, and reactive to light. Right eye exhibits no discharge. Left eye exhibits no discharge. No scleral icterus.  Neck: Normal range of motion. Neck supple. No JVD present. No thyromegaly present.  Cardiovascular: Normal heart sounds and intact distal pulses.  Exam reveals no gallop and no friction rub.   No murmur heard. Normal rate, irregular rhythm, strong pulses at the radial arteries  Pulmonary/Chest: Effort normal and breath sounds normal. No respiratory distress. He has no wheezes. He has no rales. He exhibits tenderness (mild tenderness over the left chest wall).  Abdominal: Soft. Bowel sounds are normal. He exhibits no distension and no mass. There is no tenderness.  Musculoskeletal: Normal range of motion. He  exhibits no edema or tenderness.  Lymphadenopathy:    He has no cervical adenopathy.  Neurological: He is alert. Coordination normal.  Skin: Skin is warm and dry. No rash noted. No erythema.  Psychiatric: He has a normal mood and affect. His behavior is normal.  Nursing note and vitals reviewed.   ED Course  Procedures (including critical care time) Labs Review Labs Reviewed  BASIC METABOLIC PANEL - Abnormal; Notable for the following:    Calcium 8.4 (*)    All other components within normal limits  CBC - Abnormal; Notable for the following:    RBC 3.23 (*)    Hemoglobin 9.8 (*)    HCT 30.8 (*)    RDW 16.7 (*)    All other components within normal limits  TROPONIN I    Imaging Review Dg Chest 2 View  11/25/2014   CLINICAL DATA:  Chest pain starting this morning  EXAM: CHEST  2 VIEW  COMPARISON:  11/23/2014  FINDINGS: Dual lead pacer with proximal and distal leads projecting over the right atrium and ventricle, respectively.  Atherosclerotic calcification of the aortic arch.  Emphysema noted.  Sclerotic density in the right fourth rib  for related to remote fracture.  The lungs appear otherwise clear.  Heart size within normal limits.  IMPRESSION: 1. Emphysema. 2. Atherosclerosis. 3. No acute findings.   Electronically Signed   By: Van Clines M.D.   On: 11/25/2014 11:20   Dg Lumbar Spine Complete  11/24/2014   CLINICAL DATA:  Patient was found on the floor in the bathroom. Patient lost balance and fell. Low back pain.  EXAM: LUMBAR SPINE - COMPLETE 4+ VIEW  COMPARISON:  CT abdomen and pelvis 09/30/2014  FINDINGS: Diffuse bone demineralization. Normal alignment of the lumbar spine. No vertebral compression deformities. Degenerative disc disease at the lumbosacral interspace. No focal bone lesion or bone destruction. Vascular calcifications. Surgical clips in the right upper quadrant.  IMPRESSION: Degenerative changes in the spine. Normal alignment. No acute displaced fractures.   Electronically Signed   By: Lucienne Capers M.D.   On: 11/24/2014 06:52     EKG Interpretation   Date/Time:  Friday November 25 2014 09:50:13 EDT Ventricular Rate:  91 PR Interval:    QRS Duration: 100 QT Interval:  481 QTC Calculation: 592 R Axis:   -136 Text Interpretation:  Afib/flut and V-paced complexes No further rhythm  analysis attempted due to paced rhythm Right axis deviation Prolonged QT  interval since last tracing no significant change Confirmed by Sabra Heck  MD,  Carizma Dunsworth ([id number]) on 11/25/2014 9:54:51 AM      MDM   Final diagnoses:  Chest pain    The patient has no abdominal tenderness, no tachycardia, his EKG shows atrial flutter with underlying paced ventricular beats, it is completely unchanged from prior EKGs, he will need a workup including troponin and a chest x-ray, he is hypotensive, the etiology of his hypotension is unclear, does not seem to be volume related as he has had normal intake.  Due to hypertension will give fluids  I have personally viewed and interpreted the imaging and agree with radiologist interpretation.,  pacer seen in the R chest wall, no pneumothorax, no pneumonia.  The pt has had normal xray, normal labs and VS are improved - hypotension resolved - he has no Chest Pain at the time of reevlaution at 12:30 - he has been sleeping and feels back to normal - no light headedness, no dysfunction in pacer (checked last night)  and no papitations / no SOB and no fever or cough.  He appears stable for d/c and he states he desires d/c as well.  Meds given in ED:  Medications  sodium chloride 0.9 % bolus 1,000 mL (1,000 mLs Intravenous New Bag/Given 11/25/14 1046)      Noemi Chapel, MD 11/25/14 1243

## 2014-11-25 NOTE — Discharge Instructions (Signed)
Please call your doctor for a followup appointment within 24-48 hours. When you talk to your doctor please let them know that you were seen in the emergency department and have them acquire all of your records so that they can discuss the findings with you and formulate a treatment plan to fully care for your new and ongoing problems. ° °

## 2014-11-28 ENCOUNTER — Ambulatory Visit: Payer: Medicare Other | Admitting: Hematology

## 2014-11-29 ENCOUNTER — Emergency Department (HOSPITAL_COMMUNITY): Payer: Medicare Other

## 2014-11-29 ENCOUNTER — Encounter (HOSPITAL_COMMUNITY): Payer: Self-pay | Admitting: *Deleted

## 2014-11-29 ENCOUNTER — Emergency Department (HOSPITAL_COMMUNITY)
Admission: EM | Admit: 2014-11-29 | Discharge: 2014-11-29 | Disposition: A | Discharge status: Home / Self Care | Payer: Medicare Other | Attending: Emergency Medicine | Admitting: Emergency Medicine

## 2014-11-29 DIAGNOSIS — Z720 Tobacco use: Secondary | ICD-10-CM | POA: Diagnosis not present

## 2014-11-29 DIAGNOSIS — F329 Major depressive disorder, single episode, unspecified: Secondary | ICD-10-CM | POA: Diagnosis not present

## 2014-11-29 DIAGNOSIS — Z7982 Long term (current) use of aspirin: Secondary | ICD-10-CM | POA: Insufficient documentation

## 2014-11-29 DIAGNOSIS — Z8739 Personal history of other diseases of the musculoskeletal system and connective tissue: Secondary | ICD-10-CM | POA: Insufficient documentation

## 2014-11-29 DIAGNOSIS — Z9089 Acquired absence of other organs: Secondary | ICD-10-CM | POA: Insufficient documentation

## 2014-11-29 DIAGNOSIS — Z7901 Long term (current) use of anticoagulants: Secondary | ICD-10-CM | POA: Insufficient documentation

## 2014-11-29 DIAGNOSIS — I4891 Unspecified atrial fibrillation: Secondary | ICD-10-CM | POA: Diagnosis not present

## 2014-11-29 DIAGNOSIS — E785 Hyperlipidemia, unspecified: Secondary | ICD-10-CM | POA: Insufficient documentation

## 2014-11-29 DIAGNOSIS — Z79899 Other long term (current) drug therapy: Secondary | ICD-10-CM | POA: Diagnosis not present

## 2014-11-29 DIAGNOSIS — K529 Noninfective gastroenteritis and colitis, unspecified: Secondary | ICD-10-CM | POA: Diagnosis not present

## 2014-11-29 DIAGNOSIS — N400 Enlarged prostate without lower urinary tract symptoms: Secondary | ICD-10-CM | POA: Insufficient documentation

## 2014-11-29 DIAGNOSIS — Z87828 Personal history of other (healed) physical injury and trauma: Secondary | ICD-10-CM | POA: Insufficient documentation

## 2014-11-29 DIAGNOSIS — R1030 Lower abdominal pain, unspecified: Secondary | ICD-10-CM | POA: Diagnosis present

## 2014-11-29 LAB — CBC WITH DIFFERENTIAL/PLATELET
BASOS ABS: 0 10*3/uL (ref 0.0–0.1)
Basophils Relative: 0 % (ref 0–1)
EOS ABS: 0.1 10*3/uL (ref 0.0–0.7)
EOS PCT: 1 % (ref 0–5)
HCT: 37.4 % — ABNORMAL LOW (ref 39.0–52.0)
Hemoglobin: 11.9 g/dL — ABNORMAL LOW (ref 13.0–17.0)
LYMPHS ABS: 2.6 10*3/uL (ref 0.7–4.0)
LYMPHS PCT: 40 % (ref 12–46)
MCH: 30.5 pg (ref 26.0–34.0)
MCHC: 31.8 g/dL (ref 30.0–36.0)
MCV: 95.9 fL (ref 78.0–100.0)
Monocytes Absolute: 0.4 10*3/uL (ref 0.1–1.0)
Monocytes Relative: 7 % (ref 3–12)
Neutro Abs: 3.4 10*3/uL (ref 1.7–7.7)
Neutrophils Relative %: 52 % (ref 43–77)
PLATELETS: 212 10*3/uL (ref 150–400)
RBC: 3.9 MIL/uL — AB (ref 4.22–5.81)
RDW: 16.9 % — AB (ref 11.5–15.5)
WBC: 6.6 10*3/uL (ref 4.0–10.5)

## 2014-11-29 LAB — COMPREHENSIVE METABOLIC PANEL
ALK PHOS: 89 U/L (ref 38–126)
ALT: 12 U/L — ABNORMAL LOW (ref 17–63)
ANION GAP: 7 (ref 5–15)
AST: 16 U/L (ref 15–41)
Albumin: 3.6 g/dL (ref 3.5–5.0)
BUN: 17 mg/dL (ref 6–20)
CO2: 24 mmol/L (ref 22–32)
CREATININE: 0.65 mg/dL (ref 0.61–1.24)
Calcium: 9 mg/dL (ref 8.9–10.3)
Chloride: 106 mmol/L (ref 101–111)
GFR calc non Af Amer: >60 mL/min (ref >60)
Glucose, Bld: 115 mg/dL — ABNORMAL HIGH (ref 65–99)
POTASSIUM: 3.6 mmol/L (ref 3.5–5.1)
Sodium: 137 mmol/L (ref 135–145)
TOTAL PROTEIN: 7.4 g/dL (ref 6.5–8.1)
Total Bilirubin: 0.5 mg/dL (ref 0.3–1.2)

## 2014-11-29 LAB — LIPASE, BLOOD: Lipase: 53 U/L — ABNORMAL HIGH (ref 22–51)

## 2014-11-29 MED ORDER — IOHEXOL 300 MG/ML  SOLN
50.0000 mL | Freq: Once | INTRAMUSCULAR | Status: AC | PRN
  Administered 2014-11-29: 50 mL via ORAL

## 2014-11-29 MED ORDER — METRONIDAZOLE 500 MG PO TABS: 500.0000 mg | ORAL_TABLET | Freq: Three times a day (TID) | ORAL | Status: DC

## 2014-11-29 MED ORDER — IOHEXOL 300 MG/ML  SOLN
80.0000 mL | Freq: Once | INTRAMUSCULAR | Status: AC | PRN
  Administered 2014-11-29: 80 mL via INTRAVENOUS

## 2014-11-29 MED ORDER — CIPROFLOXACIN HCL 500 MG PO TABS: 500.0000 mg | ORAL_TABLET | Freq: Two times a day (BID) | ORAL | Status: DC

## 2014-11-29 NOTE — ED Notes (Signed)
Patient has been up to the [facility] several times for lose stools.  Instructed patient to call for assistance when he needs to get up.  Call bell within reach

## 2014-11-29 NOTE — ED Notes (Signed)
[facility] at bedside to transport pt back to facility

## 2014-11-29 NOTE — ED Provider Notes (Signed)
CSN: [id number]     Arrival date & time 11/29/14  0224 History  This chart was scribed for Veryl Speak, MD by Rayfield Citizen, ED Scribe. This patient was seen in room D36C/D36C and the patient's care was started at 3:26 AM.     Chief Complaint  Patient presents with  . Abdominal Pain   The history is provided by the patient. No language interpreter was used.     HPI Comments: [name] is a 69 y.o. male who presents to the Emergency Department complaining of lower abdominal pain, described as a burning sensation, beginning tonight which patient associates with a recent surgical intervention. He explains he had "part of his colon removed" due to "black spots on it" three weeks PTA here at [facility]. Patient reports a small amount of blood in his stool tonight. He denies fever, denies known hemorrhoids, sick contacts.   Past Medical History  Diagnosis Date  . Depression   . Hypomagnesemia   . Fall at nursing home 05/27/2013    "slipped on water; said I broke my right hip" (05/27/2013)  . Migraine     "used to have them bad; haven't had one in awhile" (05/27/2013)  . Gout attack     "not too long ago; in my right foot" (05/27/2013)  . Complete heart block     reason for pacemaker/notes 05/27/2013  . Atrial fibrillation     [name] 05/27/2013  . HLD (hyperlipidemia)   . GIB (gastrointestinal bleeding)   . BPH (benign prostatic hyperplasia)    Past Surgical History  Procedure Laterality Date  . Insert / replace / remove pacemaker    . Appendectomy    . Cholecystectomy    . Open reduction of hip Right 05/28/2013    Procedure: OPEN REDUCTION OF HIP;  Surgeon: Renette Butters, MD;  Location: [place];  Service: Orthopedics;  Laterality: Right;  . Esophagogastroduodenoscopy (egd) with propofol N/A 09/26/2014    Procedure: ESOPHAGOGASTRODUODENOSCOPY (EGD) WITH PROPOFOL;  Surgeon: Jerene Bears, MD;  Location: [facility] ENDOSCOPY;  Service: Endoscopy;  Laterality: N/A;  . Colonoscopy N/A 10/01/2014     Procedure: COLONOSCOPY;  Surgeon: Jerene Bears, MD;  Location[facility] ENDOSCOPY;  Service: Endoscopy;  Laterality: N/A;  . Partial colectomy N/A 10/05/2014    Procedure: SUBTOTAL COLECTOMY WITH ILEORECTAL ANASTOMOSIS;  Surgeon: Donnie Mesa, MD;  Location: [place];  Service: General;  Laterality: N/A;  . Flexible sigmoidoscopy N/A 11/07/2014    Procedure: FLEXIBLE SIGMOIDOSCOPY;  Surgeon: Jerene Bears, MD;  Location: [facility] ENDOSCOPY;  Service: Endoscopy;  Laterality: N/A;   Family History  Problem Relation Age of Onset  . Colon cancer Mother    History  Substance Use Topics  . Smoking status: Current Some Day Smoker -- 0.25 packs/day for 61 years    Types: Cigarettes  . Smokeless tobacco: Former Systems developer    Types: Snuff, Chew     Comment: 05/27/2013 "haven't used chew or snuff in ~ 30 yr"  . Alcohol Use: No     Comment: 05/27/2013 "none in years; never had problem w/it" - not in 30 years    Review of Systems  A complete 10 system review of systems was obtained and all systems are negative except as noted in the HPI and PMH.    Allergies  Review of patient's allergies indicates no known allergies.  Home Medications   Prior to Admission medications   Medication Sig Start Date End Date Taking? Authorizing Provider  aspirin 81 MG  chewable tablet Chew 81 mg by mouth daily.   Yes Historical Provider, MD  Calcium Carbonate-Vit D-Min (CALTRATE 600+D PLUS MINERALS) 600-800 MG-UNIT TABS Take 1 tablet by mouth daily.   Yes Historical Provider, MD  carvedilol (COREG) 12.5 MG tablet Take 12.5 mg by mouth 2 (two) times daily with a meal.   Yes Historical Provider, MD  docusate sodium (COLACE) 100 MG capsule Take 100 mg by mouth 2 (two) times daily.   Yes Historical Provider, MD  doxazosin (CARDURA) 4 MG tablet Take 4 mg by mouth daily.   Yes Historical Provider, MD  lisinopril (PRINIVIL,ZESTRIL) 5 MG tablet Take 5 mg by mouth daily.   Yes Historical Provider, MD  magnesium oxide (MAG-OX) 400 MG tablet Take  400 mg by mouth 2 (two) times daily.   Yes Historical Provider, MD  pravastatin (PRAVACHOL) 10 MG tablet Take 10 mg by mouth at bedtime.   Yes Historical Provider, MD  rivaroxaban (XARELTO) 20 MG TABS tablet Take 20 mg by mouth daily.   Yes Historical Provider, MD  sertraline (ZOLOFT) 50 MG tablet Take 50 mg by mouth every morning.    Yes Historical Provider, MD  traMADol (ULTRAM) 50 MG tablet Take 1 tablet (50 mg total) by mouth every 6 (six) hours as needed for moderate pain or severe pain. Patient taking differently: Take 50 mg by mouth 2 (two) times daily.  11/09/14  Yes Modena Jansky, MD  zolpidem (AMBIEN) 5 MG tablet Take 5 mg by mouth at bedtime.   Yes Historical Provider, MD  carvedilol (COREG) 6.25 MG tablet Take 1 tablet (6.25 mg total) by mouth 2 (two) times daily with a meal. Patient not taking: Reported on 11/29/2014 11/16/14   Velvet Bathe, MD  feeding supplement, ENSURE, (ENSURE) PUDG Take 1 Container by mouth 3 (three) times daily between meals. Patient not taking: Reported on 11/29/2014 11/09/14   Modena Jansky, MD  loperamide (IMODIUM) 2 MG capsule Take 1 capsule (2 mg total) by mouth as needed for diarrhea or loose stools (you may give 2mg  of imodium after each loose stool, up to 16 mg total). Patient not taking: Reported on 11/29/2014 10/17/14   Domenic Polite, MD  polycarbophil (FIBERCON) 625 MG tablet Take 1 tablet (625 mg total) by mouth daily. Patient not taking: Reported on 11/29/2014 11/09/14   Modena Jansky, MD   BP 143/73 mmHg  Pulse 77  Temp(Src) 98.4 F (36.9 C) (Oral)  Resp 23  Ht 5\' 9"  (1.753 m)  Wt 93 lb (42.185 kg)  BMI 13.73 kg/m2  SpO2 100% Physical Exam  Constitutional: He is oriented to person, place, and time. He appears well-developed and well-nourished.  HENT:  Head: Normocephalic and atraumatic.  Neck: No tracheal deviation present.  Cardiovascular: Normal rate, regular rhythm and normal heart sounds.  Exam reveals no gallop and no friction rub.    No murmur heard. Pulmonary/Chest: Effort normal and breath sounds normal. No respiratory distress. He has no wheezes. He has no rales.  Abdominal: Soft. There is tenderness (Tender to palpation over the suprapubic region). There is no rebound and no guarding.  Musculoskeletal: Normal range of motion. He exhibits no edema.  Neurological: He is alert and oriented to person, place, and time.  Skin: Skin is warm and dry.  Psychiatric: He has a normal mood and affect. His behavior is normal.  Nursing note and vitals reviewed.   ED Course  Procedures   DIAGNOSTIC STUDIES: Oxygen Saturation is 100% on RA, normal by  my interpretation.    COORDINATION OF CARE: 3:29 AM Discussed treatment plan with pt at bedside and pt agreed to plan.   Labs Review Labs Reviewed  CBC WITH DIFFERENTIAL/PLATELET - Abnormal; Notable for the following:    RBC 3.90 (*)    Hemoglobin 11.9 (*)    HCT 37.4 (*)    RDW 16.9 (*)    All other components within normal limits  COMPREHENSIVE METABOLIC PANEL - Abnormal; Notable for the following:    Glucose, Bld 115 (*)    ALT 12 (*)    All other components within normal limits  LIPASE, BLOOD - Abnormal; Notable for the following:    Lipase 53 (*)    All other components within normal limits    Imaging Review No results found.   EKG Interpretation None      MDM   Final diagnoses:  None    Patient presents with complaints of lower abdominal discomfort and bloody stool. CT scan reveals an area of soft tissue edema about the remaining sigmoid colon consistent with an inflammatory process. Will treat with cipro, flagyl, and prn return.    I personally performed the services described in this documentation, which was scribed in my presence. The recorded information has been reviewed and is accurate.       Veryl Speak, MD 11/29/14 [phone]

## 2014-11-29 NOTE — Discharge Instructions (Signed)
Cipro and flagyl as prescribed.  Follow up with your primary doctor if not improving in the next 2-3 days, and return to the ER if symptoms significantly worsen or change.   Colitis Colitis is inflammation of the colon. Colitis can be a short-term or long-standing (chronic) illness. Crohn's disease and ulcerative colitis are 2 types of colitis which are chronic. They usually require lifelong treatment. CAUSES  There are many different causes of colitis, including:  Viruses.  Germs (bacteria).  Medicine reactions. SYMPTOMS   Diarrhea.  Intestinal bleeding.  Pain.  Fever.  Throwing up (vomiting).  Tiredness (fatigue).  Weight loss.  Bowel blockage. DIAGNOSIS  The diagnosis of colitis is based on examination and stool or blood tests. X-rays, CT scan, and colonoscopy may also be needed. TREATMENT  Treatment may include:  Fluids given through the vein (intravenously).  Bowel rest (nothing to eat or drink for a period of time).  Medicine for pain and diarrhea.  Medicines (antibiotics) that kill germs.  Cortisone medicines.  Surgery. HOME CARE INSTRUCTIONS   Get plenty of rest.  Drink enough water and fluids to keep your urine clear or pale yellow.  Eat a well-balanced diet.  Call your caregiver for follow-up as recommended. SEEK IMMEDIATE MEDICAL CARE IF:   You develop chills.  You have an oral temperature above 102 F (38.9 C), not controlled by medicine.  You have extreme weakness, fainting, or dehydration.  You have repeated vomiting.  You develop severe belly (abdominal) pain or are passing bloody or tarry stools. MAKE SURE YOU:   Understand these instructions.  Will watch your condition.  Will get help right away if you are not doing well or get worse. Document Released: 07/04/2004 Document Revised: 08/19/2011 Document Reviewed: 09/29/2009 [facility] Patient Information 2015 [place], [place]. This information is not intended to replace advice  given to you by your health care provider. Make sure you discuss any questions you have with your health care provider.

## 2014-11-29 NOTE — ED Notes (Signed)
Patient presents stating he had surgery about 3 weeks ago and had half of his colon removed.  Tonight about 1 PTA he got up to use the BR and started with abd pain and stated he noticed some blood with the stool

## 2014-11-29 NOTE — ED Notes (Signed)
[facility] for pickup.

## 2014-11-29 NOTE — ED Notes (Signed)
This nurse called [facility].  They are unable to pickup patient at this time per [place]

## 2014-11-29 NOTE — ED Notes (Signed)
[facility] has been called for transportation. Pt received discharge instructions and is ready for discharge

## 2014-12-01 ENCOUNTER — Observation Stay (HOSPITAL_BASED_OUTPATIENT_CLINIC_OR_DEPARTMENT_OTHER): Payer: Medicare Other

## 2014-12-01 ENCOUNTER — Other Ambulatory Visit (HOSPITAL_COMMUNITY): Payer: Medicare Other

## 2014-12-01 ENCOUNTER — Observation Stay (HOSPITAL_COMMUNITY)
Admission: EM | Admit: 2014-12-01 | Discharge: 2014-12-02 | Discharge status: Against Medical Advice | Payer: Medicare Other | Attending: Internal Medicine | Admitting: Internal Medicine

## 2014-12-01 ENCOUNTER — Other Ambulatory Visit: Payer: Self-pay | Admitting: Radiology

## 2014-12-01 ENCOUNTER — Emergency Department (HOSPITAL_COMMUNITY): Payer: Medicare Other

## 2014-12-01 ENCOUNTER — Encounter (HOSPITAL_COMMUNITY): Payer: Self-pay | Admitting: Emergency Medicine

## 2014-12-01 DIAGNOSIS — Z9049 Acquired absence of other specified parts of digestive tract: Secondary | ICD-10-CM | POA: Diagnosis not present

## 2014-12-01 DIAGNOSIS — I442 Atrioventricular block, complete: Secondary | ICD-10-CM | POA: Diagnosis not present

## 2014-12-01 DIAGNOSIS — I700 Atherosclerosis of aorta: Secondary | ICD-10-CM | POA: Diagnosis present

## 2014-12-01 DIAGNOSIS — I4892 Unspecified atrial flutter: Secondary | ICD-10-CM | POA: Diagnosis not present

## 2014-12-01 DIAGNOSIS — Z950 Presence of cardiac pacemaker: Secondary | ICD-10-CM | POA: Insufficient documentation

## 2014-12-01 DIAGNOSIS — F329 Major depressive disorder, single episode, unspecified: Secondary | ICD-10-CM | POA: Diagnosis present

## 2014-12-01 DIAGNOSIS — I4891 Unspecified atrial fibrillation: Secondary | ICD-10-CM | POA: Diagnosis present

## 2014-12-01 DIAGNOSIS — E46 Unspecified protein-calorie malnutrition: Secondary | ICD-10-CM | POA: Diagnosis not present

## 2014-12-01 DIAGNOSIS — C8580 Other specified types of non-Hodgkin lymphoma, unspecified site: Secondary | ICD-10-CM

## 2014-12-01 DIAGNOSIS — I10 Essential (primary) hypertension: Secondary | ICD-10-CM | POA: Diagnosis present

## 2014-12-01 DIAGNOSIS — Z7982 Long term (current) use of aspirin: Secondary | ICD-10-CM | POA: Diagnosis not present

## 2014-12-01 DIAGNOSIS — I482 Chronic atrial fibrillation: Secondary | ICD-10-CM

## 2014-12-01 DIAGNOSIS — Z590 Homelessness: Secondary | ICD-10-CM | POA: Insufficient documentation

## 2014-12-01 DIAGNOSIS — F17210 Nicotine dependence, cigarettes, uncomplicated: Secondary | ICD-10-CM | POA: Diagnosis not present

## 2014-12-01 DIAGNOSIS — R079 Chest pain, unspecified: Secondary | ICD-10-CM

## 2014-12-01 DIAGNOSIS — Z7901 Long term (current) use of anticoagulants: Secondary | ICD-10-CM | POA: Diagnosis not present

## 2014-12-01 DIAGNOSIS — C8510 Unspecified B-cell lymphoma, unspecified site: Secondary | ICD-10-CM | POA: Insufficient documentation

## 2014-12-01 DIAGNOSIS — I2089 Other forms of angina pectoris: Secondary | ICD-10-CM

## 2014-12-01 DIAGNOSIS — I208 Other forms of angina pectoris: Secondary | ICD-10-CM | POA: Diagnosis not present

## 2014-12-01 DIAGNOSIS — M109 Gout, unspecified: Secondary | ICD-10-CM | POA: Diagnosis not present

## 2014-12-01 DIAGNOSIS — J439 Emphysema, unspecified: Secondary | ICD-10-CM | POA: Insufficient documentation

## 2014-12-01 DIAGNOSIS — I209 Angina pectoris, unspecified: Secondary | ICD-10-CM | POA: Diagnosis not present

## 2014-12-01 DIAGNOSIS — E785 Hyperlipidemia, unspecified: Secondary | ICD-10-CM | POA: Diagnosis not present

## 2014-12-01 DIAGNOSIS — N400 Enlarged prostate without lower urinary tract symptoms: Secondary | ICD-10-CM | POA: Diagnosis present

## 2014-12-01 DIAGNOSIS — F32A Depression, unspecified: Secondary | ICD-10-CM | POA: Diagnosis present

## 2014-12-01 HISTORY — DX: Other forms of angina pectoris: I20.8

## 2014-12-01 HISTORY — DX: Other forms of angina pectoris: I20.89

## 2014-12-01 LAB — CBC WITH DIFFERENTIAL/PLATELET
Basophils Absolute: 0 10*3/uL (ref 0.0–0.1)
Basophils Relative: 0 % (ref 0–1)
EOS PCT: 2 % (ref 0–5)
Eosinophils Absolute: 0.1 10*3/uL (ref 0.0–0.7)
HCT: 34 % — ABNORMAL LOW (ref 39.0–52.0)
Hemoglobin: 10.6 g/dL — ABNORMAL LOW (ref 13.0–17.0)
LYMPHS ABS: 2 10*3/uL (ref 0.7–4.0)
Lymphocytes Relative: 33 % (ref 12–46)
MCH: 29.9 pg (ref 26.0–34.0)
MCHC: 31.2 g/dL (ref 30.0–36.0)
MCV: 96 fL (ref 78.0–100.0)
MONOS PCT: 8 % (ref 3–12)
Monocytes Absolute: 0.5 10*3/uL (ref 0.1–1.0)
NEUTROS ABS: 3.4 10*3/uL (ref 1.7–7.7)
Neutrophils Relative %: 57 % (ref 43–77)
PLATELETS: 186 10*3/uL (ref 150–400)
RBC: 3.54 MIL/uL — ABNORMAL LOW (ref 4.22–5.81)
RDW: 16.8 % — AB (ref 11.5–15.5)
WBC: 6 10*3/uL (ref 4.0–10.5)

## 2014-12-01 LAB — COMPREHENSIVE METABOLIC PANEL
ALK PHOS: 83 U/L (ref 38–126)
ALT: 9 U/L — ABNORMAL LOW (ref 17–63)
AST: 13 U/L — ABNORMAL LOW (ref 15–41)
Albumin: 3.2 g/dL — ABNORMAL LOW (ref 3.5–5.0)
Anion gap: 7 (ref 5–15)
BILIRUBIN TOTAL: 0.3 mg/dL (ref 0.3–1.2)
BUN: 22 mg/dL — ABNORMAL HIGH (ref 6–20)
CO2: 24 mmol/L (ref 22–32)
CREATININE: 0.73 mg/dL (ref 0.61–1.24)
Calcium: 8.9 mg/dL (ref 8.9–10.3)
Chloride: 109 mmol/L (ref 101–111)
GFR calc Af Amer: >60 mL/min (ref >60)
Glucose, Bld: 108 mg/dL — ABNORMAL HIGH (ref 65–99)
Potassium: 4.3 mmol/L (ref 3.5–5.1)
Sodium: 140 mmol/L (ref 135–145)
Total Protein: 7 g/dL (ref 6.5–8.1)

## 2014-12-01 LAB — I-STAT TROPONIN, ED
Troponin i, poc: 0 ng/mL (ref 0.00–0.08)
Troponin i, poc: 0 ng/mL (ref 0.00–0.08)

## 2014-12-01 LAB — TROPONIN I
Troponin I: <0.03 ng/mL (ref <0.031)
Troponin I: <0.03 ng/mL (ref <0.031)

## 2014-12-01 LAB — PROTIME-INR
INR: 1.19 (ref 0.00–1.49)
Prothrombin Time: 15.2 seconds (ref 11.6–15.2)

## 2014-12-01 MED ORDER — DOXAZOSIN MESYLATE 4 MG PO TABS
4.0000 mg | ORAL_TABLET | Freq: Every day | ORAL | Status: DC
  Administered 2014-12-01: 4 mg via ORAL
  Filled 2014-12-01 (×2): qty 1

## 2014-12-01 MED ORDER — CIPROFLOXACIN HCL 500 MG PO TABS
500.0000 mg | ORAL_TABLET | Freq: Two times a day (BID) | ORAL | Status: DC
  Administered 2014-12-01 – 2014-12-02 (×2): 500 mg via ORAL
  Filled 2014-12-01 (×3): qty 1

## 2014-12-01 MED ORDER — CARVEDILOL 12.5 MG PO TABS
12.5000 mg | ORAL_TABLET | Freq: Two times a day (BID) | ORAL | Status: DC
  Administered 2014-12-01 – 2014-12-02 (×3): 12.5 mg via ORAL
  Filled 2014-12-01 (×4): qty 1

## 2014-12-01 MED ORDER — MORPHINE SULFATE 2 MG/ML IJ SOLN: 2.0000 mg | INTRAMUSCULAR | Status: DC | PRN

## 2014-12-01 MED ORDER — NITROGLYCERIN 0.4 MG SL SUBL
0.4000 mg | SUBLINGUAL_TABLET | SUBLINGUAL | Status: DC | PRN
Start: 2014-12-01 — End: 2014-12-02

## 2014-12-01 MED ORDER — CALCIUM CARBONATE-VITAMIN D 500-200 MG-UNIT PO TABS
1.0000 | ORAL_TABLET | Freq: Every day | ORAL | Status: DC
  Administered 2014-12-02: 1 via ORAL
  Filled 2014-12-01 (×2): qty 1

## 2014-12-01 MED ORDER — ONDANSETRON HCL 4 MG/2ML IJ SOLN
4.0000 mg | Freq: Once | INTRAMUSCULAR | Status: AC
  Administered 2014-12-01: 4 mg via INTRAVENOUS
  Filled 2014-12-01: qty 2

## 2014-12-01 MED ORDER — SERTRALINE HCL 50 MG PO TABS
50.0000 mg | ORAL_TABLET | Freq: Every morning | ORAL | Status: DC
  Administered 2014-12-02: 50 mg via ORAL
  Filled 2014-12-01: qty 1

## 2014-12-01 MED ORDER — DOCUSATE SODIUM 100 MG PO CAPS
100.0000 mg | ORAL_CAPSULE | Freq: Two times a day (BID) | ORAL | Status: DC
  Administered 2014-12-01: 100 mg via ORAL
  Filled 2014-12-01 (×3): qty 1

## 2014-12-01 MED ORDER — CALTRATE 600+D PLUS MINERALS 600-800 MG-UNIT PO TABS: 1.0000 | ORAL_TABLET | Freq: Every day | ORAL | Status: DC

## 2014-12-01 MED ORDER — MORPHINE SULFATE 2 MG/ML IJ SOLN
2.0000 mg | Freq: Once | INTRAMUSCULAR | Status: AC
  Administered 2014-12-01: 2 mg via INTRAVENOUS
  Filled 2014-12-01: qty 1

## 2014-12-01 MED ORDER — PRAVASTATIN SODIUM 10 MG PO TABS
10.0000 mg | ORAL_TABLET | Freq: Every day | ORAL | Status: DC
  Administered 2014-12-01: 10 mg via ORAL
  Filled 2014-12-01 (×2): qty 1

## 2014-12-01 MED ORDER — RIVAROXABAN 20 MG PO TABS
20.0000 mg | ORAL_TABLET | Freq: Every day | ORAL | Status: DC
  Administered 2014-12-01 – 2014-12-02 (×2): 20 mg via ORAL
  Filled 2014-12-01 (×2): qty 1

## 2014-12-01 MED ORDER — ACETAMINOPHEN 325 MG PO TABS
650.0000 mg | ORAL_TABLET | ORAL | Status: DC | PRN
  Administered 2014-12-02: 650 mg via ORAL
  Filled 2014-12-01: qty 2

## 2014-12-01 MED ORDER — METRONIDAZOLE 500 MG PO TABS
500.0000 mg | ORAL_TABLET | Freq: Three times a day (TID) | ORAL | Status: DC
  Administered 2014-12-01 – 2014-12-02 (×3): 500 mg via ORAL
  Filled 2014-12-01 (×7): qty 1

## 2014-12-01 MED ORDER — CALCIUM POLYCARBOPHIL 625 MG PO TABS
625.0000 mg | ORAL_TABLET | Freq: Every day | ORAL | Status: DC
  Filled 2014-12-01: qty 1

## 2014-12-01 MED ORDER — MAGNESIUM OXIDE 400 MG PO TABS
400.0000 mg | ORAL_TABLET | Freq: Two times a day (BID) | ORAL | Status: DC
  Administered 2014-12-01 – 2014-12-02 (×3): 400 mg via ORAL
  Filled 2014-12-01 (×4): qty 1

## 2014-12-01 MED ORDER — LISINOPRIL 5 MG PO TABS: 5.0000 mg | ORAL_TABLET | Freq: Every day | ORAL | Status: DC

## 2014-12-01 MED ORDER — MORPHINE SULFATE 4 MG/ML IJ SOLN
4.0000 mg | Freq: Once | INTRAMUSCULAR | Status: AC
  Administered 2014-12-01: 4 mg via INTRAVENOUS
  Filled 2014-12-01: qty 1

## 2014-12-01 MED ORDER — ENSURE PUDDING PO PUDG
1.0000 | Freq: Three times a day (TID) | ORAL | Status: DC
  Administered 2014-12-01 – 2014-12-02 (×3): 1 via ORAL
  Filled 2014-12-01 (×6): qty 1

## 2014-12-01 MED ORDER — TRAMADOL HCL 50 MG PO TABS
50.0000 mg | ORAL_TABLET | Freq: Four times a day (QID) | ORAL | Status: DC | PRN
  Administered 2014-12-01 – 2014-12-02 (×2): 50 mg via ORAL
  Filled 2014-12-01 (×2): qty 1

## 2014-12-01 NOTE — Progress Notes (Signed)
  Echocardiogram 2D Echocardiogram has been performed.  Diamond Nickel 12/01/2014, 3:39 PM

## 2014-12-01 NOTE — Consult Note (Signed)
CARDIOLOGY CONSULT NOTE       Patient ID: [name] MRN: [id number] DOB/AGE: 08/08/1945 69 y.o.  Admit date: 12/01/2014 Referring Physician:  Jerilee Hoh  Primary Physician: [name], MD Primary Cardiologist:  Lovena Le Reason for Consultation:  Chest pain  Principal Problem:   Atypical angina Active Problems:   Protein-calorie malnutrition   BLOCK, AV, COMPLETE   ATHEROSCLEROSIS, AORTIC   Atrial fibrillation   BPH (benign prostatic hyperplasia)   HLD (hyperlipidemia)   Depression   Essential hypertension   Marginal zone lymphoma   HPI:  69 y.o. with history of chronic afib/flutter on anticoagulation.  SSS post pacer.  Generator changed in 2012 by Dr Lovena Le.  He indicates someone comes to NH to check twice a year.  Smoker He indicated to NH and ER  left-sided chest pain beginning at 1900 6/22 while sitting and resting quietly. It was not positional or pleuritic, not associated with dyspnea, diaphoresis, arm/neck/jaw pain. He denies any dyspnea with exertion when walking with a walker. He endorses chronic palpitations without recent changes. Denies orthopnea, PND, LE swelling. The staff at [facility] tranfserred him to the [facility] ED where his pain was improved with morphine and NTG.    On floor denies SSCP to me.  Complains of epigastric and abdominal pain.  S/P colectomy 10/05/14 for lymphoma.  No nausea, vomiting or diarhea No fever   ROS All other systems reviewed and negative except as noted above  Past Medical History  Diagnosis Date  . Depression   . Hypomagnesemia   . Fall at nursing home 05/27/2013    "slipped on water; said I broke my right hip" (05/27/2013)  . Migraine     "used to have them bad; haven't had one in awhile" (05/27/2013)  . Gout attack     "not too long ago; in my right foot" (05/27/2013)  . Complete heart block     reason for pacemaker/notes 05/27/2013  . Atrial fibrillation     [name] 05/27/2013  . HLD (hyperlipidemia)   . GIB  (gastrointestinal bleeding)   . BPH (benign prostatic hyperplasia)     Family History  Problem Relation Age of Onset  . Colon cancer Mother     History   Social History  . Marital Status: Divorced    Spouse Name: N/A  . Number of Children: N/A  . Years of Education: N/A   Occupational History  . Not on file.   Social History Main Topics  . Smoking status: Current Some Day Smoker -- 0.25 packs/day for 61 years    Types: Cigarettes  . Smokeless tobacco: Former Systems developer    Types: Snuff, Chew     Comment: 05/27/2013 "haven't used chew or snuff in ~ 30 yr"  . Alcohol Use: No     Comment: 05/27/2013 "none in years; never had problem w/it" - not in 30 years  . Drug Use: No  . Sexual Activity: No   Other Topics Concern  . Not on file   Social History Narrative    Past Surgical History  Procedure Laterality Date  . Insert / replace / remove pacemaker    . Appendectomy    . Cholecystectomy    . Open reduction of hip Right 05/28/2013    Procedure: OPEN REDUCTION OF HIP;  Surgeon: Renette Butters, MD;  Location: [place];  Service: Orthopedics;  Laterality: Right;  . Esophagogastroduodenoscopy (egd) with propofol N/A 09/26/2014    Procedure: ESOPHAGOGASTRODUODENOSCOPY (EGD) WITH PROPOFOL;  Surgeon: Ulice Dash  Everitt Amber, MD;  Location: [place];  Service: Endoscopy;  Laterality: N/A;  . Colonoscopy N/A 10/01/2014    Procedure: COLONOSCOPY;  Surgeon: Jerene Bears, MD;  Location: [facility] ENDOSCOPY;  Service: Endoscopy;  Laterality: N/A;  . Partial colectomy N/A 10/05/2014    Procedure: SUBTOTAL COLECTOMY WITH ILEORECTAL ANASTOMOSIS;  Surgeon: Donnie Mesa, MD;  Location: [place];  Service: General;  Laterality: N/A;  . Flexible sigmoidoscopy N/A 11/07/2014    Procedure: FLEXIBLE SIGMOIDOSCOPY;  Surgeon: Jerene Bears, MD;  Location: [facility] ENDOSCOPY;  Service: Endoscopy;  Laterality: N/A;     . CALTRATE 600+D PLUS MINERALS  1 tablet Oral Daily  . carvedilol  12.5 mg Oral BID WC  . ciprofloxacin  500 mg  Oral BID  . docusate sodium  100 mg Oral BID  . doxazosin  4 mg Oral Daily  . feeding supplement (ENSURE)  1 Container Oral TID BM  . lisinopril  5 mg Oral Daily  . magnesium oxide  400 mg Oral BID  . metroNIDAZOLE  500 mg Oral TID  . polycarbophil  625 mg Oral Daily  . pravastatin  10 mg Oral QHS  . rivaroxaban  20 mg Oral Daily  . sertraline  50 mg Oral q morning - 10a      Physical Exam: Blood pressure 121/59, pulse 64, temperature 98.2 F (36.8 C), temperature source Oral, resp. rate 16, height 5\' 9"  (1.753 m), weight 44.226 kg (97 lb 8 oz), SpO2 98 %.   Affect appropriate Desheveled white male  HEENT: normal Neck supple with no adenopathy JVP normal no bruits no thyromegaly Lungs clear with no wheezing and good diaphragmatic motion Heart:  S1/S2 no murmur, no rub, gallop or click PMI normal Abdomen: benighn, BS positve, no tenderness, no AAA no bruit.  No HSM or HJR Post colectomy no ostomy  Distal pulses intact with no bruits No edema Neuro non-focal Skin warm and dry No muscular weakness  Labs:   Lab Results  Component Value Date   WBC 6.0 12/01/2014   HGB 10.6* 12/01/2014   HCT 34.0* 12/01/2014   MCV 96.0 12/01/2014   PLT 186 12/01/2014    Recent Labs Lab 12/01/14 0654  NA 140  K 4.3  CL 109  CO2 24  BUN 22*  CREATININE 0.73  CALCIUM 8.9  PROT 7.0  BILITOT 0.3  ALKPHOS 83  ALT 9*  AST 13*  GLUCOSE 108*   Lab Results  Component Value Date   TROPONINI <0.03 11/25/2014    Lab Results  Component Value Date   CHOL 110 09/30/2014   Lab Results  Component Value Date   HDL 35* 09/30/2014   Lab Results  Component Value Date   LDLCALC 62 09/30/2014   Lab Results  Component Value Date   TRIG 101 10/10/2014   TRIG 78 10/09/2014   TRIG 83 10/06/2014   Lab Results  Component Value Date   CHOLHDL 3.1 09/30/2014   No results found for: LDLDIRECT    Radiology: Dg Chest 2 View  12/01/2014   CLINICAL DATA:  Left-sided chest pain.   Shortness of breath.  EXAM: CHEST - 2 VIEW  COMPARISON:  Two-view chest x-ray 11/25/2014  FINDINGS: The heart size is normal. A dual lead pacemaker is stable in position. Atherosclerotic changes are again noted at the aortic arch. Emphysematous changes are present. No focal airspace disease is evident. There is no edema or effusion to suggest failure. The visualized soft tissues and bony thorax are unremarkable.  IMPRESSION: 1. No acute cardiopulmonary disease. Imaging findings of potential clinical significance:  1. Emphysema 2. Atherosclerosis of the thoracic aorta.   Electronically Signed   By: San Morelle M.D.   On: 12/01/2014 07:54   Dg Chest 2 View  11/25/2014   CLINICAL DATA:  Chest pain starting this morning  EXAM: CHEST  2 VIEW  COMPARISON:  11/23/2014  FINDINGS: Dual lead pacer with proximal and distal leads projecting over the right atrium and ventricle, respectively.  Atherosclerotic calcification of the aortic arch.  Emphysema noted.  Sclerotic density in the right fourth rib for related to remote fracture.  The lungs appear otherwise clear.  Heart size within normal limits.  IMPRESSION: 1. Emphysema. 2. Atherosclerosis. 3. No acute findings.   Electronically Signed   By: Van Clines M.D.   On: 11/25/2014 11:20   Dg Lumbar Spine Complete  11/24/2014   CLINICAL DATA:  Patient was found on the floor in the bathroom. Patient lost balance and fell. Low back pain.  EXAM: LUMBAR SPINE - COMPLETE 4+ VIEW  COMPARISON:  CT abdomen and pelvis 09/30/2014  FINDINGS: Diffuse bone demineralization. Normal alignment of the lumbar spine. No vertebral compression deformities. Degenerative disc disease at the lumbosacral interspace. No focal bone lesion or bone destruction. Vascular calcifications. Surgical clips in the right upper quadrant.  IMPRESSION: Degenerative changes in the spine. Normal alignment. No acute displaced fractures.   Electronically Signed   By: Lucienne Capers M.D.   On:  11/24/2014 06:52   Ct Head Wo Contrast  11/06/2014   CLINICAL DATA:  Fall, on Xarelto  EXAM: CT HEAD WITHOUT CONTRAST  TECHNIQUE: Contiguous axial images were obtained from the base of the skull through the vertex without intravenous contrast.  COMPARISON:  10/27/2006  FINDINGS: Motion degraded images.  No evidence of parenchymal hemorrhage or extra-axial fluid collection. No mass lesion, mass effect, or midline shift.  No CT evidence of acute infarction.  Subcortical white matter and periventricular small vessel ischemic changes. Intracranial atherosclerosis.  Global cortical atrophy.  No ventriculomegaly.  The visualized paranasal sinuses are essentially clear. The mastoid air cells are unopacified.  No evidence of calvarial fracture.  IMPRESSION: No evidence of acute intracranial abnormality.  Atrophy with small vessel ischemic changes.   Electronically Signed   By: Julian Hy M.D.   On: 11/06/2014 10:08   Ct Chest W Contrast  11/23/2014   CLINICAL DATA:  Subsequent encounter forB-cell lymphoma  EXAM: CT CHEST WITH CONTRAST  TECHNIQUE: Multidetector CT imaging of the chest was performed during intravenous contrast administration.  CONTRAST:  50mL OMNIPAQUE IOHEXOL 300 MG/ML  SOLN  COMPARISON:  10/27/2006  FINDINGS: Mediastinum / Lymph Nodes: Right-sided permanent pacemaker again noted. No axillary lymphadenopathy. No mediastinal or hilar lymphadenopathy. Heart size is normal. No Evidence of pericardial effusion.  Lungs / Pleura: Centrilobular emphysema noted bilaterally with chronic bronchial wall thickening. No focal airspace consolidation. No suspicious pulmonary parenchymal nodule or mass. No pulmonary edema or pleural effusion.  MSK / Soft Tissues: Bone windows reveal no worrisome lytic or sclerotic osseous lesions.  Upper Abdomen: 16 right adrenal nodule is stable since 09/30/2014 but slightly larger than 10/27/2006. Relative enhancement washout views 44% which is borderline for adrenal adenoma.   IMPRESSION: No acute findings in the chest.  16 mm right adrenal nodule has washout characteristics borderline for benign adrenal adenoma. Attention on followup imaging is recommended. If there is clinical concern to further evaluate this finding more urgently, MRI with in and out  of phase imaging is recommended.   Electronically Signed   By: Misty Stanley M.D.   On: 11/23/2014 12:19   Ct Abdomen Pelvis W Contrast  11/29/2014   CLINICAL DATA:  Acute onset of epigastric abdominal pain and blood in stool. Initial encounter.  EXAM: CT ABDOMEN AND PELVIS WITH CONTRAST  TECHNIQUE: Multidetector CT imaging of the abdomen and pelvis was performed using the standard protocol following bolus administration of intravenous contrast.  CONTRAST:  44mL OMNIPAQUE IOHEXOL 300 MG/ML  SOLN  COMPARISON:  CT of the abdomen and pelvis performed 09/30/2014  FINDINGS: The visualized lung bases are clear. A right ventricular lead is partially imaged.  Mild prominence of the intrahepatic biliary ducts is within normal limits status post cholecystectomy. Clips are noted at the gallbladder fossa. The gallbladder is within normal limits. The pancreas and adrenal glands are unremarkable.  The kidneys are unremarkable in appearance. There is no evidence of hydronephrosis. No renal or ureteral stones are seen. No perinephric stranding is appreciated.  No free fluid is identified. The small bowel is unremarkable in appearance. The stomach is within normal limits. No acute vascular abnormalities are seen.  Diffuse calcification is noted along the abdominal aorta and its branches, including at the proximal renal arteries and along the superior mesenteric artery. There appears to be marked luminal narrowing along the right common iliac artery, though there is reconstitution of flow within the right external iliac artery.  The patient is status post appendectomy. Much of the colon has been resected, with the ileocolic anastomosis at the sigmoid  colon. Vague soft tissue edema is suggested about the remaining sigmoid colon, possibly reflecting a mild infectious or inflammatory process. Contrast progresses to the level of the rectum.  The bladder is decompressed and not well assessed. Bladder wall thickening is nonspecific. The prostate remains normal in size. No inguinal lymphadenopathy is seen.  No acute osseous abnormalities are identified. An intramedullary rod and screw are noted within the proximal right femur.  IMPRESSION: 1. Vague soft tissue edema suggested about the remaining sigmoid colon, possibly reflecting a mild infectious or inflammatory process. Ileocolic anastomosis is unremarkable in appearance. 2. Nonspecific bladder wall thickening. Would correlate for any evidence of cystitis. 3. Diffuse calcification along the abdominal aorta and its branches, including at the proximal renal arteries and along the superior mesenteric artery. Apparent marked luminal narrowing along the right common iliac artery, though there is reconstitution of flow within the right external iliac artery.   Electronically Signed   By: Garald Balding M.D.   On: 11/29/2014 06:25   Dg Hip Unilat With Pelvis 2-3 Views Left  11/06/2014   CLINICAL DATA:  Dizziness and fall with left hip pain.  EXAM: LEFT HIP (WITH PELVIS) 2-3 VIEWS  COMPARISON:  05/28/2013 and CT 09/30/2014  FINDINGS: Examination demonstrates diffuse decreased bone mineralization. There are mild symmetric degenerative changes of the hips. Fixation hardware over the right femur is intact and unchanged. No evidence of acute fracture dislocation of the left hip/pelvis. There are degenerative changes of the spine as well as calcified plaque over the abdominal aorta and iliac arteries.  IMPRESSION: No acute findings.   Electronically Signed   By: Marin Olp M.D.   On: 11/06/2014 10:13    EKG: AFib /Flutter intermitant pacing   ASSESSMENT AND PLAN:  Chest Pain;  Atypical resolved no history of CAD more  abdominal pain with recent colectomy Troponin negative  Echo pending No need for further cardiac w/u if echo shows  normal EF  Pacer:  Pocket ok generator changed 2013  F/u Dr Lovena Le  CA;  6/21 CT with some soft tissue edema but no acute issues No distension plan per oncology  Signed: Jenkins Rouge 12/01/2014, 1:37 PM

## 2014-12-01 NOTE — ED Provider Notes (Signed)
CSN: [id number]     Arrival date & time 12/01/14  0605 History   First MD Initiated Contact with Patient 12/01/14 0630     Chief Complaint  Patient presents with  . Chest Pain     (Consider location/radiation/quality/duration/timing/severity/associated sxs/prior Treatment) HPI    PCP: [name], MD Blood pressure 117/45, pulse 72, temperature 98 F (36.7 C), temperature source Oral, resp. rate 23, SpO2 98 %.  CHICK COUSINS is a 69 y.o.male with a significant PMH of depression, frequent falls, malnutrition, gout hx, complete heart block, atrial flutter rate controlled on Xarelto, HLD, GI bleeding hx , BPH  presents to the ER with complaints of chest pain. He was seen for the same on 6/17 after eating at [employer] and was discharged in stable condition not requiring any pain medication. He was brought in by EMS today after having 2 SL nitro with no relief and 324 mg aspirin. Pt still continues to have non radiating substernal chest pain. Denies any complaints of GI bleeding at this time. Per triage EKG is in a.flutter rate controlled. His pain is a pressure. Denies having a cardiologist. He is homeless but is currently staying at [facility] for rehab. Denies LE swelling.     The patient denies diaphoresis, fever, headache, weakness (general or focal), confusion, change of vision,  neck pain, dysphagia, aphagia, shortness of breath,  back pain, abdominal pains, nausea, vomiting, diarrhea, lower extremity swelling, rash.  Past Medical History  Diagnosis Date  . Depression   . Hypomagnesemia   . Fall at nursing home 05/27/2013    "slipped on water; said I broke my right hip" (05/27/2013)  . Migraine     "used to have them bad; haven't had one in awhile" (05/27/2013)  . Gout attack     "not too long ago; in my right foot" (05/27/2013)  . Complete heart block     reason for pacemaker/notes 05/27/2013  . Atrial fibrillation     [name] 05/27/2013  . HLD (hyperlipidemia)   . GIB  (gastrointestinal bleeding)   . BPH (benign prostatic hyperplasia)    Past Surgical History  Procedure Laterality Date  . Insert / replace / remove pacemaker    . Appendectomy    . Cholecystectomy    . Open reduction of hip Right 05/28/2013    Procedure: OPEN REDUCTION OF HIP;  Surgeon: Renette Butters, MD;  Location: [place];  Service: Orthopedics;  Laterality: Right;  . Esophagogastroduodenoscopy (egd) with propofol N/A 09/26/2014    Procedure: ESOPHAGOGASTRODUODENOSCOPY (EGD) WITH PROPOFOL;  Surgeon: Jerene Bears, MD;  Location: [facility] ENDOSCOPY;  Service: Endoscopy;  Laterality: N/A;  . Colonoscopy N/A 10/01/2014    Procedure: COLONOSCOPY;  Surgeon: Jerene Bears, MD;  Location: [facility] ENDOSCOPY;  Service: Endoscopy;  Laterality: N/A;  . Partial colectomy N/A 10/05/2014    Procedure: SUBTOTAL COLECTOMY WITH ILEORECTAL ANASTOMOSIS;  Surgeon: Donnie Mesa, MD;  Location: [place];  Service: General;  Laterality: N/A;  . Flexible sigmoidoscopy N/A 11/07/2014    Procedure: FLEXIBLE SIGMOIDOSCOPY;  Surgeon: Jerene Bears, MD;  Location: [facility] ENDOSCOPY;  Service: Endoscopy;  Laterality: N/A;   Family History  Problem Relation Age of Onset  . Colon cancer Mother    History  Substance Use Topics  . Smoking status: Current Some Day Smoker -- 0.25 packs/day for 61 years    Types: Cigarettes  . Smokeless tobacco: Former Systems developer    Types: Snuff, Chew     Comment: 05/27/2013 "haven't used chew  or snuff in ~ 30 yr"  . Alcohol Use: No     Comment: 05/27/2013 "none in years; never had problem w/it" - not in 30 years    Review of Systems  10 Systems reviewed and are negative for acute change except as noted in the HPI.   Allergies  Review of patient's allergies indicates no known allergies.  Home Medications   Prior to Admission medications   Medication Sig Start Date End Date Taking? Authorizing Provider  aspirin 81 MG chewable tablet Chew 81 mg by mouth daily.    Historical Provider, MD  Calcium  Carbonate-Vit D-Min (CALTRATE 600+D PLUS MINERALS) 600-800 MG-UNIT TABS Take 1 tablet by mouth daily.    Historical Provider, MD  carvedilol (COREG) 12.5 MG tablet Take 12.5 mg by mouth 2 (two) times daily with a meal.    Historical Provider, MD  carvedilol (COREG) 6.25 MG tablet Take 1 tablet (6.25 mg total) by mouth 2 (two) times daily with a meal. Patient not taking: Reported on 11/29/2014 11/16/14   Velvet Bathe, MD  ciprofloxacin (CIPRO) 500 MG tablet Take 1 tablet (500 mg total) by mouth 2 (two) times daily. One po bid x 7 days 11/29/14   Veryl Speak, MD  docusate sodium (COLACE) 100 MG capsule Take 100 mg by mouth 2 (two) times daily.    Historical Provider, MD  doxazosin (CARDURA) 4 MG tablet Take 4 mg by mouth daily.    Historical Provider, MD  feeding supplement, ENSURE, (ENSURE) PUDG Take 1 Container by mouth 3 (three) times daily between meals. Patient not taking: Reported on 11/29/2014 11/09/14   Modena Jansky, MD  lisinopril (PRINIVIL,ZESTRIL) 5 MG tablet Take 5 mg by mouth daily.    Historical Provider, MD  loperamide (IMODIUM) 2 MG capsule Take 1 capsule (2 mg total) by mouth as needed for diarrhea or loose stools (you may give 2mg  of imodium after each loose stool, up to 16 mg total). Patient not taking: Reported on 11/29/2014 10/17/14   Domenic Polite, MD  magnesium oxide (MAG-OX) 400 MG tablet Take 400 mg by mouth 2 (two) times daily.    Historical Provider, MD  metroNIDAZOLE (FLAGYL) 500 MG tablet Take 1 tablet (500 mg total) by mouth 3 (three) times daily. One po bid x 7 days 11/29/14   Veryl Speak, MD  polycarbophil (FIBERCON) 625 MG tablet Take 1 tablet (625 mg total) by mouth daily. Patient not taking: Reported on 11/29/2014 11/09/14   Modena Jansky, MD  pravastatin (PRAVACHOL) 10 MG tablet Take 10 mg by mouth at bedtime.    Historical Provider, MD  rivaroxaban (XARELTO) 20 MG TABS tablet Take 20 mg by mouth daily.    Historical Provider, MD  sertraline (ZOLOFT) 50 MG tablet Take  50 mg by mouth every morning.     Historical Provider, MD  traMADol (ULTRAM) 50 MG tablet Take 1 tablet (50 mg total) by mouth every 6 (six) hours as needed for moderate pain or severe pain. Patient taking differently: Take 50 mg by mouth 2 (two) times daily.  11/09/14   Modena Jansky, MD  zolpidem (AMBIEN) 5 MG tablet Take 5 mg by mouth at bedtime.    Historical Provider, MD   BP 134/73 mmHg  Pulse 93  Temp(Src) 98 F (36.7 C) (Oral)  Resp 22  SpO2 100% Physical Exam  Constitutional: He appears well-developed and well-nourished. No distress.  HENT:  Head: Normocephalic and atraumatic.  Eyes: Pupils are equal, round, and reactive to  light.  Neck: Normal range of motion. Neck supple.  Cardiovascular: Normal rate and regular rhythm.   Pulmonary/Chest: Effort normal and breath sounds normal. He has no decreased breath sounds. He has no wheezes. He exhibits no tenderness and no bony tenderness.  Abdominal: Soft.  Musculoskeletal:  No LE swelling  Neurological: He is alert.  Skin: Skin is warm and dry.  Nursing note and vitals reviewed.   ED Course  Procedures (including critical care time) Labs Review Labs Reviewed  CBC WITH DIFFERENTIAL/PLATELET - Abnormal; Notable for the following:    RBC 3.54 (*)    Hemoglobin 10.6 (*)    HCT 34.0 (*)    RDW 16.8 (*)    All other components within normal limits  COMPREHENSIVE METABOLIC PANEL - Abnormal; Notable for the following:    Glucose, Bld 108 (*)    BUN 22 (*)    Albumin 3.2 (*)    AST 13 (*)    ALT 9 (*)    All other components within normal limits  PROTIME-INR  I-STAT TROPOININ, ED  Randolm Idol, ED    Imaging Review Dg Chest 2 View  12/01/2014   CLINICAL DATA:  Left-sided chest pain.  Shortness of breath.  EXAM: CHEST - 2 VIEW  COMPARISON:  Two-view chest x-ray 11/25/2014  FINDINGS: The heart size is normal. A dual lead pacemaker is stable in position. Atherosclerotic changes are again noted at the aortic arch.  Emphysematous changes are present. No focal airspace disease is evident. There is no edema or effusion to suggest failure. The visualized soft tissues and bony thorax are unremarkable.  IMPRESSION: 1. No acute cardiopulmonary disease. Imaging findings of potential clinical significance:  1. Emphysema 2. Atherosclerosis of the thoracic aorta.   Electronically Signed   By: San Morelle M.D.   On: 12/01/2014 07:54     EKG Interpretation   Date/Time:  Thursday December 01 2014 06:13:22 EDT Ventricular Rate:  75 PR Interval:    QRS Duration: 104 QT Interval:  418 QTC Calculation: 467 R Axis:   144 Text Interpretation:  Afib/flut and V-paced complexes No further rhythm  analysis attempted due to paced rhythm Probable right ventricular  hypertrophy No significant change since last tracing Confirmed by OTTER   MD, OLGA ([id number]) on 12/01/2014 6:24:24 AM      MDM   Final diagnoses:  Chest pain    Patients pain finally resolved after 2 SL nitro, 324 mg Aspiring and 6 mg Morphine. He has not had an echo or cardiac cath, he has a.flutter rate controlled. Concern for angina and need for further work-up and will require admission for work-up. Discussed with Dr. Sabra Heck who agrees with plan.  Filed Vitals:   12/01/14 1000  BP: 134/73  Pulse: 93  Temp:   Resp: 22    [facility] to admit patient, he will put in admission orders.  Delos Haring, PA-C 12/01/14 1033  Noemi Chapel, MD 12/02/14 2029

## 2014-12-01 NOTE — ED Notes (Signed)
Patient comes from [facility]. Patient started having Chest pain at 1900. Chest pain has been constant since then denies any radiation. Patient denies any SOB, n/v lightheadness. Patient has a Dietitian. Patient given 324 ASA and (2) nitro with no relief.

## 2014-12-01 NOTE — Clinical Social Work Note (Signed)
Clinical Social Work Assessment  Patient Details  Name: [name] MRN: [id number] Date of Birth: [birth year]  Date of referral:  12/01/14               Reason for consult:  Facility Placement                Permission sought to share information with:  Facility Art therapist granted to share information::  Yes, Verbal Permission Granted  Name::        Agency::     Relationship::     Contact Information:     Housing/Transportation Living arrangements for the past 2 months:  [place] of Information:  Patient Patient Interpreter Needed:  None Criminal Activity/Legal Involvement Pertinent to Current Situation/Hospitalization:  No - Comment as needed Significant Relationships:  None Lives with:  Facility Resident Do you feel safe going back to the place where you live?  Yes Need for family participation in patient care:  No (Coment)  Care giving concerns: CSW notified pt admitted from [place] Worker assessment / plan:  CSW visited pt room. Pt laying in bed comfortably during assessment. Pt confirms he was admitted from [facility] where he has been a resident for over 10 years. Pt happy at facility and plans to return at dc. Per pt, plan will be to remain in hospital over night and dc back to ALF tomorrow.  CSW spoke with [name] at facility. She confirmed that pending review of FL2 with dc medications there should be no issues with pt returning at dc. [name] or [name] at facility will review FL2 on day of dc. CSW will need to fax this to [phone]  Employment status:    Insurance information:  Medicaid In [place] PT Recommendations:  Not assessed at this time Information / Referral to community resources:   (None needed at this time)  Patient/Family's Response to care:  Pt accepting of CSW visit and agreeable to returning to ALF  Patient/Family's Understanding of and Emotional Response to Diagnosis, Current Treatment, and  Prognosis:  Pt able to communicate chief complaint with CSW minimally. Pt with appropriate emotional response.   Emotional Assessment Appearance:  Appears stated age, Well-Groomed Attitude/Demeanor/Rapport:  Other (Cooperative; Pleasant) Affect (typically observed):  Calm, Appropriate, Happy Orientation:  Oriented to Self, Oriented to Place, Oriented to  Time, Oriented to Situation Alcohol / Substance use:  Not Applicable Psych involvement (Current and /or in the community):  No (Comment)  Discharge Needs  Concerns to be addressed:  Discharge Planning Concerns Readmission within the last 30 days:  Yes Current discharge risk:  None Barriers to Discharge:  Continued Medical Work up   [employer], [place]

## 2014-12-01 NOTE — H&P (Signed)
[facility]          History and Physical    PCP:   [name], MD   EDP: Delos Haring, PA-C  Chief Complaint:  chest pain  HPI: [name] is a 69 y.o. 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   He reports left-sided chest pain beginning at 1900 6/22 while sitting and resting quietly. It was not positional or pleuritic, not associated with dyspnea, diaphoresis, arm/neck/jaw pain. He denies any dyspnea with exertion when walking with a walker. He endorses chronic palpitations without recent changes. Denies orthopnea, PND, LE swelling. The staff at [facility] tranfserred him to the [facility] ED where his pain was improved with morphine and NTG. ECG was inconclusive for ischemia due to paced rhythm and troponins have been negative x2.   He has been assessed in the [facility] x4 in the past week, twice now for chest pain. Was diagnosed with colitis on 6/21 and started cipro/flagyl. He reports 3-4 loose BMs per day which is not new. No recent weight loss, fevers, night sweats. He has no regular cardiologist and has not had an ischemic work up.  He is a resident of [facility] x 10 years due to homelessness. Smokes 7 cigarettes per day. No EtOH or other drugs.  Allergies:  No Known Allergies    Past Medical History  Diagnosis Date  . Depression   . Hypomagnesemia   . Fall at nursing home 05/27/2013    "slipped on water; said I broke my right hip" (05/27/2013)  . Migraine     "used to have them bad; haven't had one in awhile" (05/27/2013)  . Gout attack     "not too long ago; in my right foot" (05/27/2013)  . Complete heart block     reason for pacemaker/notes 05/27/2013  . Atrial fibrillation     [name] 05/27/2013  . HLD (hyperlipidemia)   . GIB (gastrointestinal bleeding)   . BPH  (benign prostatic hyperplasia)     Past Surgical History  Procedure Laterality Date  . Insert / replace / remove pacemaker    . Appendectomy    . Cholecystectomy    . Open reduction of hip Right 05/28/2013    Procedure: OPEN REDUCTION OF HIP;  Surgeon: Renette Butters, MD;  Location: [place];  Service: Orthopedics;  Laterality: Right;  . Esophagogastroduodenoscopy (egd) with propofol N/A 09/26/2014    Procedure: ESOPHAGOGASTRODUODENOSCOPY (EGD) WITH PROPOFOL;  Surgeon: Jerene Bears, MD;  Location: [facility] ENDOSCOPY;  Service: Endoscopy;  Laterality: N/A;  . Colonoscopy N/A 10/01/2014    Procedure: COLONOSCOPY;  Surgeon: Jerene Bears, MD;  Location: [facility] ENDOSCOPY;  Service: Endoscopy;  Laterality: N/A;  . Partial colectomy N/A 10/05/2014    Procedure: SUBTOTAL COLECTOMY WITH ILEORECTAL ANASTOMOSIS;  Surgeon: Donnie Mesa, MD;  Location: [place];  Service: General;  Laterality: N/A;  . Flexible sigmoidoscopy N/A 11/07/2014    Procedure: FLEXIBLE SIGMOIDOSCOPY;  Surgeon: Jerene Bears, MD;  Location[facility] END[facility]COPY;  Service: Endoscopy;  Laterality: N/A;    Prior to Admission medications   Medication Sig Start Date End Date Taking? Authorizing Provider  aspirin 81 MG chewable tablet Chew 81 mg by mouth daily.    Historical Provider, MD  Calcium Carbonate-Vit D-Min (CALTRATE  600+D PLUS MINERALS) 600-800 MG-UNIT TABS Take 1 tablet by mouth daily.    Historical Provider, MD  carvedilol (COREG) 12.5 MG tablet Take 12.5 mg by mouth 2 (two) times daily with a meal.    Historical Provider, MD  carvedilol (COREG) 6.25 MG tablet Take 1 tablet (6.25 mg total) by mouth 2 (two) times daily with a meal. Patient not taking: Reported on 11/29/2014 11/16/14   Velvet Bathe, MD  ciprofloxacin (CIPRO) 500 MG tablet Take 1 tablet (500 mg total) by mouth 2 (two) times daily. One po bid x 7 days 11/29/14   Veryl Speak, MD  docusate sodium (COLACE) 100 MG capsule Take 100 mg by mouth 2 (two) times daily.    Historical Provider, MD    doxazosin (CARDURA) 4 MG tablet Take 4 mg by mouth daily.    Historical Provider, MD  feeding supplement, ENSURE, (ENSURE) PUDG Take 1 Container by mouth 3 (three) times daily between meals. Patient not taking: Reported on 11/29/2014 11/09/14   Modena Jansky, MD  lisinopril (PRINIVIL,ZESTRIL) 5 MG tablet Take 5 mg by mouth daily.    Historical Provider, MD  loperamide (IMODIUM) 2 MG capsule Take 1 capsule (2 mg total) by mouth as needed for diarrhea or loose stools (you may give 38m of imodium after each loose stool, up to 16 mg total). Patient not taking: Reported on 11/29/2014 10/17/14   PDomenic Polite MD  magnesium oxide (MAG-OX) 400 MG tablet Take 400 mg by mouth 2 (two) times daily.    Historical Provider, MD  metroNIDAZOLE (FLAGYL) 500 MG tablet Take 1 tablet (500 mg total) by mouth 3 (three) times daily. One po bid x 7 days 11/29/14   DVeryl Speak MD  polycarbophil (FIBERCON) 625 MG tablet Take 1 tablet (625 mg total) by mouth daily. Patient not taking: Reported on 11/29/2014 11/09/14   AModena Jansky MD  pravastatin (PRAVACHOL) 10 MG tablet Take 10 mg by mouth at bedtime.    Historical Provider, MD  rivaroxaban (XARELTO) 20 MG TABS tablet Take 20 mg by mouth daily.    Historical Provider, MD  sertraline (ZOLOFT) 50 MG tablet Take 50 mg by mouth every morning.     Historical Provider, MD  traMADol (ULTRAM) 50 MG tablet Take 1 tablet (50 mg total) by mouth every 6 (six) hours as needed for moderate pain or severe pain. Patient taking differently: Take 50 mg by mouth 2 (two) times daily.  11/09/14   AModena Jansky MD  zolpidem (AMBIEN) 5 MG tablet Take 5 mg by mouth at bedtime.    Historical Provider, MD    Social History:  reports that he has been smoking Cigarettes.  He has a 15.25 pack-year smoking history. He has quit using smokeless tobacco. His smokeless tobacco use included Snuff and Chew. He reports that he does not drink alcohol or use illicit drugs.  Family History  Problem  Relation Age of Onset  . Colon cancer Mother     Review of Systems:  Denies fever, chills, weight loss, changes in vision or hearing, headache, cough, sore throat, shortness of breath, abdominal pain, nausea, vomiting, changes in bowel habits, change in bladder habits, myalgias, arthralgias, and rash.   Physical Exam: Blood pressure 133/93, pulse 88, temperature 98 F (36.7 C), temperature source Oral, resp. rate 19, SpO2 100 %. Gen: Very thin but well-appearing 69y.o.male in NAD HEENT: PERRL, MMM, posterior oropharynx clear, poor dentition Pulm: Non-labored, 100% breathing ambient air. Mild end-expiratory wheezes without prolonged  expiration.  CV: Irreg irreg, no murmur, rub, or gallop. Radial, DP and PT pulses 1+ and symmetric. No JVD. No LE edema. GI: + BS; soft, non-tender, non-distended, no HSM Skin: Vertical midline abdominal incision well healed. Diffuse photodermatosis and hyperpigmentation in sun-exposed areas. No other rashes. MSK: No chest wall deformities or pain with palpation. Neuro: No deficits noted. Psych: A&Ox3, mood euthymic with congruent affect, speech clear and coherent  ECG: Irregular rate, atrial flutter with some ventricular paced beats, no ST segment changes.  Labs on Admission:  Results for orders placed or performed during the hospital encounter of 12/01/14 (from the past 48 hour(s))  CBC with Differential/Platelet     Status: Abnormal   Collection Time: 12/01/14  6:54 AM  Result Value Ref Range   WBC 6.0 4.0 - 10.5 K/uL   RBC 3.54 (L) 4.22 - 5.81 MIL/uL   Hemoglobin 10.6 (L) 13.0 - 17.0 g/dL   HCT 34.0 (L) 39.0 - 52.0 %   MCV 96.0 78.0 - 100.0 fL   MCH 29.9 26.0 - 34.0 pg   MCHC 31.2 30.0 - 36.0 g/dL   RDW 16.8 (H) 11.5 - 15.5 %   Platelets 186 150 - 400 K/uL   Neutrophils Relative % 57 43 - 77 %   Neutro Abs 3.4 1.7 - 7.7 K/uL   Lymphocytes Relative 33 12 - 46 %   Lymphs Abs 2.0 0.7 - 4.0 K/uL   Monocytes Relative 8 3 - 12 %   Monocytes Absolute  0.5 0.1 - 1.0 K/uL   Eosinophils Relative 2 0 - 5 %   Eosinophils Absolute 0.1 0.0 - 0.7 K/uL   Basophils Relative 0 0 - 1 %   Basophils Absolute 0.0 0.0 - 0.1 K/uL  Protime-INR     Status: None   Collection Time: 12/01/14  6:54 AM  Result Value Ref Range   Prothrombin Time 15.2 11.6 - 15.2 seconds   INR 1.19 0.00 - 1.49  Comprehensive metabolic panel     Status: Abnormal   Collection Time: 12/01/14  6:54 AM  Result Value Ref Range   Sodium 140 135 - 145 mmol/L   Potassium 4.3 3.5 - 5.1 mmol/L   Chloride 109 101 - 111 mmol/L   CO2 24 22 - 32 mmol/L   Glucose, Bld 108 (H) 65 - 99 mg/dL   BUN 22 (H) 6 - 20 mg/dL   Creatinine, Ser 0.73 0.61 - 1.24 mg/dL   Calcium 8.9 8.9 - 10.3 mg/dL   Total Protein 7.0 6.5 - 8.1 g/dL   Albumin 3.2 (L) 3.5 - 5.0 g/dL   AST 13 (L) 15 - 41 U/L   ALT 9 (L) 17 - 63 U/L   Alkaline Phosphatase 83 38 - 126 U/L   Total Bilirubin 0.3 0.3 - 1.2 mg/dL   GFR calc non Af Amer >60 >60 mL/min   GFR calc Af Amer >60 >60 mL/min    Comment: (NOTE) The eGFR has been calculated using the CKD EPI equation. This calculation has not been validated in all clinical situations. eGFR's persistently <60 mL/min signify possible Chronic Kidney Disease.    Anion gap 7 5 - 15  I-stat troponin, ED     Status: None   Collection Time: 12/01/14  7:04 AM  Result Value Ref Range   Troponin i, poc 0.00 0.00 - 0.08 ng/mL   Comment 3            Comment: Due to the release kinetics of cTnI,  a negative result within the first hours of the onset of symptoms does not rule out myocardial infarction with certainty. If myocardial infarction is still suspected, repeat the test at appropriate intervals.   I-stat troponin, ED     Status: None   Collection Time: 12/01/14  9:40 AM  Result Value Ref Range   Troponin i, poc 0.00 0.00 - 0.08 ng/mL   Comment 3            Comment: Due to the release kinetics of cTnI, a negative result within the first hours of the onset of symptoms does  not rule out myocardial infarction with certainty. If myocardial infarction is still suspected, repeat the test at appropriate intervals.     Radiological Exams on Admission: Dg Chest 2 View  12/01/2014   CLINICAL DATA:  Left-sided chest pain.  Shortness of breath.  EXAM: CHEST - 2 VIEW  COMPARISON:  Two-view chest x-ray 11/25/2014  FINDINGS: The heart size is normal. A dual lead pacemaker is stable in position. Atherosclerotic changes are again noted at the aortic arch. Emphysematous changes are present. No focal airspace disease is evident. There is no edema or effusion to suggest failure. The visualized soft tissues and bony thorax are unremarkable.  IMPRESSION: 1. No acute cardiopulmonary disease. Imaging findings of potential clinical significance:  1. Emphysema 2. Atherosclerosis of the thoracic aorta.   Electronically Signed   By: San Morelle M.D.   On: 12/01/2014 07:54    Assessment/Plan Principal Problem:   Atypical angina Active Problems:   Protein-calorie malnutrition   BLOCK, AV, COMPLETE   ATHEROSCLEROSIS, AORTIC   Atrial fibrillation   BPH (benign prostatic hyperplasia)   HLD (hyperlipidemia)   Depression   Essential hypertension   Marginal zone lymphoma  ACS rule out: Atypical chest pain in a gentleman with cardiac risk factors (HTN, smoker, HLD, atherosclerosis) and no history of work up. HEART score: 4. CXR shows only emphysema. Appears euvolemic.  - Admit to [facility] service - I have consulted cardiology. - Repeat ECG in AM (Rate controlled A flutter without ST segment changes in ED) - Cycle troponins (neg x2 in ED) - TSH, CBC, BMP in AM - Morphine 2 mg IV q2h prn chest pain  - Nitroglycerin 0.58m SL q583m prn chest pain  - O2 by [place] prn hypoxemia - Echocardiogram  Atrial flutter, HTN, and heart block s/p pacemaker 1990: Rate controlled on beta blocker, on full dose anticoagulation.  - Continue coreg and xarelto. Also lisinopril.  -  Discontinued ASA given no history of stents   Colitis: In setting of subtotal colectomy with ileorectal anastamosis in April. Hgb stable from previous.   - Continue cipro/flagyl (6/21 - 6/27). QTc: 46736m. Needs dose today.  - Continue bowel program - Monitor hgb  Marginal zone lymphoma: S/p subtotal colectomy. Sees Dr. FenBurr Medico outpatient, scheduled for CT chest and bone marrow biopsy to rule out adenopathy/marrow involvement.  Depression: Stable.  - Continue zoloft  Protein-calorie malnutrition: Continue ensure  BPH: Continue cardura  Time Spent on Admission: 50 minutes  Ryan B. GruBonner PunaD, PGY-2 [facility] Resident 12/01/2014 10:55 AM  Patient seen and examined. Agree with above note by Dr[facility]. Patient here from assisted living facility with chest pain. It is certainly atypical, but agree with admission for ACS rule out given a heart score of 4. EKG has been independently reviewed and because of paced rhythm is inconclusive for ischemia. I will consider discharge back  to ALF tomorrow morning if ACS is ruled out without further cardiac testing. Cardiology consult has already been obtained by EDP.  Domingo Mend, MD [facility] Pager: [phone]

## 2014-12-02 DIAGNOSIS — I442 Atrioventricular block, complete: Secondary | ICD-10-CM

## 2014-12-02 DIAGNOSIS — I4892 Unspecified atrial flutter: Secondary | ICD-10-CM | POA: Diagnosis not present

## 2014-12-02 DIAGNOSIS — I209 Angina pectoris, unspecified: Secondary | ICD-10-CM | POA: Diagnosis not present

## 2014-12-02 DIAGNOSIS — I4891 Unspecified atrial fibrillation: Secondary | ICD-10-CM | POA: Diagnosis not present

## 2014-12-02 DIAGNOSIS — I208 Other forms of angina pectoris: Secondary | ICD-10-CM | POA: Diagnosis not present

## 2014-12-02 DIAGNOSIS — F329 Major depressive disorder, single episode, unspecified: Secondary | ICD-10-CM | POA: Diagnosis not present

## 2014-12-02 DIAGNOSIS — I482 Chronic atrial fibrillation: Secondary | ICD-10-CM | POA: Diagnosis not present

## 2014-12-02 DIAGNOSIS — I700 Atherosclerosis of aorta: Secondary | ICD-10-CM | POA: Diagnosis not present

## 2014-12-02 DIAGNOSIS — N400 Enlarged prostate without lower urinary tract symptoms: Secondary | ICD-10-CM

## 2014-12-02 DIAGNOSIS — I10 Essential (primary) hypertension: Secondary | ICD-10-CM

## 2014-12-02 LAB — CBC
HCT: 32.6 % — ABNORMAL LOW (ref 39.0–52.0)
HEMATOCRIT: 32.1 % — AB (ref 39.0–52.0)
HEMOGLOBIN: 10.1 g/dL — AB (ref 13.0–17.0)
Hemoglobin: 10.1 g/dL — ABNORMAL LOW (ref 13.0–17.0)
MCH: 30.5 pg (ref 26.0–34.0)
MCH: 31 pg (ref 26.0–34.0)
MCHC: 31 g/dL (ref 30.0–36.0)
MCHC: 31.5 g/dL (ref 30.0–36.0)
MCV: 98.5 fL (ref 78.0–100.0)
MCV: 98.5 fL (ref 78.0–100.0)
Platelets: 159 10*3/uL (ref 150–400)
Platelets: 162 10*3/uL (ref 150–400)
RBC: 3.31 MIL/uL — ABNORMAL LOW (ref 4.22–5.81)
RBC: 3.26 MIL/uL — AB (ref 4.22–5.81)
RDW: 16.6 % — ABNORMAL HIGH (ref 11.5–15.5)
RDW: 16.6 % — ABNORMAL HIGH (ref 11.5–15.5)
WBC: 5 10*3/uL (ref 4.0–10.5)
WBC: 5.9 10*3/uL (ref 4.0–10.5)

## 2014-12-02 LAB — APTT: aPTT: 49 seconds — ABNORMAL HIGH (ref 24–37)

## 2014-12-02 LAB — BASIC METABOLIC PANEL
ANION GAP: 6 (ref 5–15)
BUN: 19 mg/dL (ref 6–20)
CO2: 25 mmol/L (ref 22–32)
CREATININE: 0.73 mg/dL (ref 0.61–1.24)
Calcium: 8.3 mg/dL — ABNORMAL LOW (ref 8.9–10.3)
Chloride: 103 mmol/L (ref 101–111)
GFR calc non Af Amer: >60 mL/min (ref >60)
Glucose, Bld: 136 mg/dL — ABNORMAL HIGH (ref 65–99)
Potassium: 4.2 mmol/L (ref 3.5–5.1)
Sodium: 134 mmol/L — ABNORMAL LOW (ref 135–145)

## 2014-12-02 LAB — TSH: TSH: 0.622 u[IU]/mL (ref 0.350–4.500)

## 2014-12-02 MED ORDER — NICOTINE 14 MG/24HR TD PT24
14.0000 mg | MEDICATED_PATCH | Freq: Every day | TRANSDERMAL | Status: DC
  Filled 2014-12-02: qty 1

## 2014-12-02 MED ORDER — SODIUM CHLORIDE 0.9 % IV SOLN
INTRAVENOUS | Status: DC
  Administered 2014-12-02: 03:00:00 via INTRAVENOUS

## 2014-12-02 NOTE — Progress Notes (Signed)
SUBJECTIVE:  No complaints of CP  OBJECTIVE:   Vitals:   Filed Vitals:   12/01/14 2100 12/02/14 0129 12/02/14 0218 12/02/14 0544  BP: 100/71 100/60 110/56 99/79  Pulse: 85 87 68 82  Temp: 98.2 F (36.8 C) 98.4 F (36.9 C) 97.5 F (36.4 C) 97.8 F (36.6 C)  TempSrc: Oral Oral Oral Oral  Resp: 16 16 18 18   Height:      Weight:      SpO2: 96% 96% 99% 100%   I&O's:   Intake/Output Summary (Last 24 hours) at 12/02/14 0950 Last data filed at 12/02/14 0710  Gross per 24 hour  Intake   1119 ml  Output      0 ml  Net   1119 ml   TELEMETRY: Reviewed telemetry pt in NSR:     PHYSICAL EXAM General: Well developed, well nourished, in no acute distress Head: Eyes PERRLA, No xanthomas.   Normal cephalic and atramatic  Lungs:   Clear bilaterally to auscultation and percussion. Heart:   HRRR S1 S2 Pulses are 2+ & equal. Abdomen: Bowel sounds are positive, abdomen soft and non-tender without masses  Extremities:   No clubbing, cyanosis or edema.  DP +1 Neuro: Alert and oriented X 3. Psych:  Good affect, responds appropriately   LABS: Basic Metabolic Panel:  Recent Labs  12/01/14 0654 12/02/14 0616  NA 140 134*  K 4.3 4.2  CL 109 103  CO2 24 25  GLUCOSE 108* 136*  BUN 22* 19  CREATININE 0.73 0.73  CALCIUM 8.9 8.3*   Liver Function Tests:  Recent Labs  12/01/14 0654  AST 13*  ALT 9*  ALKPHOS 83  BILITOT 0.3  PROT 7.0  ALBUMIN 3.2*   No results for input(s): LIPASE, AMYLASE in the last 72 hours. CBC:  Recent Labs  12/01/14 0654 12/02/14 0432 12/02/14 0616  WBC 6.0 5.0 5.9  NEUTROABS 3.4  --   --   HGB 10.6* 10.1* 10.1*  HCT 34.0* 32.6* 32.1*  MCV 96.0 98.5 98.5  PLT 186 159 162   Cardiac Enzymes:  Recent Labs  12/01/14 1324 12/01/14 1925  TROPONINI <0.03 <0.03   BNP: Invalid input(s): POCBNP D-Dimer: No results for input(s): DDIMER in the last 72 hours. Hemoglobin A1C: No results for input(s): HGBA1C in the last 72 hours. Fasting  Lipid Panel: No results for input(s): CHOL, HDL, LDLCALC, TRIG, CHOLHDL, LDLDIRECT in the last 72 hours. Thyroid Function Tests:  Recent Labs  12/02/14 0616  TSH 0.622   Anemia Panel: No results for input(s): VITAMINB12, FOLATE, FERRITIN, TIBC, IRON, RETICCTPCT in the last 72 hours. Coag Panel:   Lab Results  Component Value Date   INR 1.19 12/01/2014   INR 1.22 11/14/2014   INR 1.54* 11/06/2014    RADIOLOGY: Dg Chest 2 View  12/01/2014   CLINICAL DATA:  Left-sided chest pain.  Shortness of breath.  EXAM: CHEST - 2 VIEW  COMPARISON:  Two-view chest x-ray 11/25/2014  FINDINGS: The heart size is normal. A dual lead pacemaker is stable in position. Atherosclerotic changes are again noted at the aortic arch. Emphysematous changes are present. No focal airspace disease is evident. There is no edema or effusion to suggest failure. The visualized soft tissues and bony thorax are unremarkable.  IMPRESSION: 1. No acute cardiopulmonary disease. Imaging findings of potential clinical significance:  1. Emphysema 2. Atherosclerosis of the thoracic aorta.   Electronically Signed   By: San Morelle M.D.   On: 12/01/2014 07:54  Dg Chest 2 View  11/25/2014   CLINICAL DATA:  Chest pain starting this morning  EXAM: CHEST  2 VIEW  COMPARISON:  11/23/2014  FINDINGS: Dual lead pacer with proximal and distal leads projecting over the right atrium and ventricle, respectively.  Atherosclerotic calcification of the aortic arch.  Emphysema noted.  Sclerotic density in the right fourth rib for related to remote fracture.  The lungs appear otherwise clear.  Heart size within normal limits.  IMPRESSION: 1. Emphysema. 2. Atherosclerosis. 3. No acute findings.   Electronically Signed   By: Van Clines M.D.   On: 11/25/2014 11:20   Dg Lumbar Spine Complete  11/24/2014   CLINICAL DATA:  Patient was found on the floor in the bathroom. Patient lost balance and fell. Low back pain.  EXAM: LUMBAR SPINE -  COMPLETE 4+ VIEW  COMPARISON:  CT abdomen and pelvis 09/30/2014  FINDINGS: Diffuse bone demineralization. Normal alignment of the lumbar spine. No vertebral compression deformities. Degenerative disc disease at the lumbosacral interspace. No focal bone lesion or bone destruction. Vascular calcifications. Surgical clips in the right upper quadrant.  IMPRESSION: Degenerative changes in the spine. Normal alignment. No acute displaced fractures.   Electronically Signed   By: Lucienne Capers M.D.   On: 11/24/2014 06:52   Ct Head Wo Contrast  11/06/2014   CLINICAL DATA:  Fall, on Xarelto  EXAM: CT HEAD WITHOUT CONTRAST  TECHNIQUE: Contiguous axial images were obtained from the base of the skull through the vertex without intravenous contrast.  COMPARISON:  10/27/2006  FINDINGS: Motion degraded images.  No evidence of parenchymal hemorrhage or extra-axial fluid collection. No mass lesion, mass effect, or midline shift.  No CT evidence of acute infarction.  Subcortical white matter and periventricular small vessel ischemic changes. Intracranial atherosclerosis.  Global cortical atrophy.  No ventriculomegaly.  The visualized paranasal sinuses are essentially clear. The mastoid air cells are unopacified.  No evidence of calvarial fracture.  IMPRESSION: No evidence of acute intracranial abnormality.  Atrophy with small vessel ischemic changes.   Electronically Signed   By: Julian Hy M.D.   On: 11/06/2014 10:08   Ct Chest W Contrast  11/23/2014   CLINICAL DATA:  Subsequent encounter forB-cell lymphoma  EXAM: CT CHEST WITH CONTRAST  TECHNIQUE: Multidetector CT imaging of the chest was performed during intravenous contrast administration.  CONTRAST:  34mL OMNIPAQUE IOHEXOL 300 MG/ML  SOLN  COMPARISON:  10/27/2006  FINDINGS: Mediastinum / Lymph Nodes: Right-sided permanent pacemaker again noted. No axillary lymphadenopathy. No mediastinal or hilar lymphadenopathy. Heart size is normal. No Evidence of pericardial  effusion.  Lungs / Pleura: Centrilobular emphysema noted bilaterally with chronic bronchial wall thickening. No focal airspace consolidation. No suspicious pulmonary parenchymal nodule or mass. No pulmonary edema or pleural effusion.  MSK / Soft Tissues: Bone windows reveal no worrisome lytic or sclerotic osseous lesions.  Upper Abdomen: 16 right adrenal nodule is stable since 09/30/2014 but slightly larger than 10/27/2006. Relative enhancement washout views 44% which is borderline for adrenal adenoma.  IMPRESSION: No acute findings in the chest.  16 mm right adrenal nodule has washout characteristics borderline for benign adrenal adenoma. Attention on followup imaging is recommended. If there is clinical concern to further evaluate this finding more urgently, MRI with in and out of phase imaging is recommended.   Electronically Signed   By: Misty Stanley M.D.   On: 11/23/2014 12:19   Ct Abdomen Pelvis W Contrast  11/29/2014   CLINICAL DATA:  Acute onset of epigastric  abdominal pain and blood in stool. Initial encounter.  EXAM: CT ABDOMEN AND PELVIS WITH CONTRAST  TECHNIQUE: Multidetector CT imaging of the abdomen and pelvis was performed using the standard protocol following bolus administration of intravenous contrast.  CONTRAST:  91mL OMNIPAQUE IOHEXOL 300 MG/ML  SOLN  COMPARISON:  CT of the abdomen and pelvis performed 09/30/2014  FINDINGS: The visualized lung bases are clear. A right ventricular lead is partially imaged.  Mild prominence of the intrahepatic biliary ducts is within normal limits status post cholecystectomy. Clips are noted at the gallbladder fossa. The gallbladder is within normal limits. The pancreas and adrenal glands are unremarkable.  The kidneys are unremarkable in appearance. There is no evidence of hydronephrosis. No renal or ureteral stones are seen. No perinephric stranding is appreciated.  No free fluid is identified. The small bowel is unremarkable in appearance. The stomach is  within normal limits. No acute vascular abnormalities are seen.  Diffuse calcification is noted along the abdominal aorta and its branches, including at the proximal renal arteries and along the superior mesenteric artery. There appears to be marked luminal narrowing along the right common iliac artery, though there is reconstitution of flow within the right external iliac artery.  The patient is status post appendectomy. Much of the colon has been resected, with the ileocolic anastomosis at the sigmoid colon. Vague soft tissue edema is suggested about the remaining sigmoid colon, possibly reflecting a mild infectious or inflammatory process. Contrast progresses to the level of the rectum.  The bladder is decompressed and not well assessed. Bladder wall thickening is nonspecific. The prostate remains normal in size. No inguinal lymphadenopathy is seen.  No acute osseous abnormalities are identified. An intramedullary rod and screw are noted within the proximal right femur.  IMPRESSION: 1. Vague soft tissue edema suggested about the remaining sigmoid colon, possibly reflecting a mild infectious or inflammatory process. Ileocolic anastomosis is unremarkable in appearance. 2. Nonspecific bladder wall thickening. Would correlate for any evidence of cystitis. 3. Diffuse calcification along the abdominal aorta and its branches, including at the proximal renal arteries and along the superior mesenteric artery. Apparent marked luminal narrowing along the right common iliac artery, though there is reconstitution of flow within the right external iliac artery.   Electronically Signed   By: Garald Balding M.D.   On: 11/29/2014 06:25   Dg Hip Unilat With Pelvis 2-3 Views Left  11/06/2014   CLINICAL DATA:  Dizziness and fall with left hip pain.  EXAM: LEFT HIP (WITH PELVIS) 2-3 VIEWS  COMPARISON:  05/28/2013 and CT 09/30/2014  FINDINGS: Examination demonstrates diffuse decreased bone mineralization. There are mild symmetric  degenerative changes of the hips. Fixation hardware over the right femur is intact and unchanged. No evidence of acute fracture dislocation of the left hip/pelvis. There are degenerative changes of the spine as well as calcified plaque over the abdominal aorta and iliac arteries.  IMPRESSION: No acute findings.   Electronically Signed   By: Marin Olp M.D.   On: 11/06/2014 10:13   ASSESSMENT AND PLAN:   Chest Pain; Atypical resolved with no history of CAD.  He is a poor historian and initial said that he had CP but then told Dr. Johnsie Cancel that it was all abdominal pain with recent colectomy.  He now tells me that he definitely had left sided chest pain that was unrelated to his abdominal pain.   Troponin negative x 3. Echo shows low normal EF 50-55% with mild AR.  Recommend NPO after  MN and Lexiscan myoview in am to rule out ischemia.    Pacer: Pocket ok generator changed 2013 F/u Dr Lovena Le  CA; 6/21 CT with some soft tissue edema but no acute issues No distension plan per oncology     Sueanne Margarita, MD  12/02/2014  9:50 AM

## 2014-12-02 NOTE — Progress Notes (Signed)
CSW was notified by nurse that a patient had signed himself out AMA today. Nurse states that the pt became agitated that he could not smoke while in hospital.   Nurse informed CSW that the pt comes from [facility]. Nurse states that he will call [facility] for patient.   CSW spoke with Director/Ms.Tamala Julian and informed her that the pt will be returning.  Willette Brace [phone] ED CSW 12/02/2014 11:11 PM

## 2014-12-02 NOTE — Progress Notes (Signed)
[facility] PROGRESS NOTE   [name] NFA:[id number] DOB: 1945-08-02 DOA: 12/01/2014 PCP: [name], MD  HPI/Subjective: Denies any chest pain this AM.  Assessment/Plan: Principal Problem:   Atypical angina Active Problems:   Protein-calorie malnutrition   BLOCK, AV, COMPLETE   ATHEROSCLEROSIS, AORTIC   Atrial fibrillation   BPH (benign prostatic hyperplasia)   HLD (hyperlipidemia)   Depression   Essential hypertension   Marginal zone lymphoma    Chest pain: Patient is a poor historian, reported different types of chest pain. 3 sets of cardiac enzymes negative as well as swelling EKG. 2-D echocardiogram showed normal ejection fraction is 50-55%, cannot rule out motion abnormalities. Chest x-ray without acute findings, shows emphysema. Stress test in a.m., if negative can be discharged back to his ALF.  Atrial flutter, HTN, and heart block s/p pacemaker 1990:  Rate controlled on beta blocker, on full dose anticoagulation.  Aspirin discontinued on admission, doubt he needs it.  Colitis:  In setting of subtotal colectomy with ileorectal anastamosis in April. Hgb stable from previous.  Continue Cipro/Flagyl patient was started prior to admission. Continue bowel regimen.  Marginal zone lymphoma:  S/p subtotal colectomy. Sees Dr. Burr Medico as outpatient, scheduled for CT chest and bone marrow biopsy to rule out adenopathy/marrow involvement.  Depression: Stable.  - Continue zoloft  Protein-calorie malnutrition:  Continue ensure  Code Status: Full Code Family Communication: Plan discussed with the patient. Disposition Plan: Remains inpatient Diet: Diet Heart Room service appropriate?: Yes; Fluid consistency:: Thin Diet NPO time specified  Consultants:  Cards  Procedures:  None  Antibiotics:  None   Objective: Filed Vitals:   12/02/14 0544  BP: 99/79  Pulse: 82  Temp: 97.8 F (36.6 C)  Resp: 18    Intake/Output Summary (Last 24  hours) at 12/02/14 1323 Last data filed at 12/02/14 1232  Gross per 24 hour  Intake   1255 ml  Output    200 ml  Net   1055 ml   Filed Weights   12/01/14 1212  Weight: 44.226 kg (97 lb 8 oz)    Exam: General: Alert and awake, oriented x3, not in any acute distress. HEENT: anicteric sclera, pupils reactive to light and accommodation, EOMI CVS: S1-S2 clear, no murmur rubs or gallops Chest: clear to auscultation bilaterally, no wheezing, rales or rhonchi Abdomen: soft nontender, nondistended, normal bowel sounds, no organomegaly Extremities: no cyanosis, clubbing or edema noted bilaterally Neuro: Cranial nerves II-XII intact, no focal neurological deficits  Data Reviewed: Basic Metabolic Panel:  Recent Labs Lab 11/29/14 0238 12/01/14 0654 12/02/14 0616  NA 137 140 134*  K 3.6 4.3 4.2  CL 106 109 103  CO2 _0 GLUCOSE 115* 108* 136*  BUN 17 22* 19  CREATININE 0.65 0.73 0.73  CALCIUM 9.0 8.9 8.3*   Liver Function Tests:  Recent Labs Lab 11/29/14 0238 12/01/14 0654  AST 16 13*  ALT 12* 9*  ALKPHOS 89 83  BILITOT 0.5 0.3  PROT 7.4 7.0  ALBUMIN 3.6 3.2*    Recent Labs Lab 11/29/14 0238  LIPASE 53*   No results for input(s): AMMONIA in the last 168 hours. CBC:  Recent Labs Lab 11/29/14 0238 12/01/14 0654 12/02/14 0432 12/02/14 0616  WBC 6.6 6.0 5.0 5.9  NEUTROABS 3.4 3.4  --   --   HGB 11.9* 10.6* 10.1* 10.1*  HCT 37.4* 34.0* 32.6* 32.1*  MCV 95.9 96.0 98.5 98.5  PLT 212 186 159 162   Cardiac Enzymes:  Recent  Labs Lab 12/01/14 1324 12/01/14 1925  TROPONINI <0.03 <0.03   BNP (last 3 results) No results for input(s): BNP in the last 8760 hours.  ProBNP (last 3 results) No results for input(s): PROBNP in the last 8760 hours.  CBG: No results for input(s): GLUCAP in the last 168 hours.  Micro No results found for this or any previous visit (from the past 240 hour(s)).   Studies: Dg Chest 2 View  12/01/2014   CLINICAL DATA:   Left-sided chest pain.  Shortness of breath.  EXAM: CHEST - 2 VIEW  COMPARISON:  Two-view chest x-ray 11/25/2014  FINDINGS: The heart size is normal. A dual lead pacemaker is stable in position. Atherosclerotic changes are again noted at the aortic arch. Emphysematous changes are present. No focal airspace disease is evident. There is no edema or effusion to suggest failure. The visualized soft tissues and bony thorax are unremarkable.  IMPRESSION: 1. No acute cardiopulmonary disease. Imaging findings of potential clinical significance:  1. Emphysema 2. Atherosclerosis of the thoracic aorta.   Electronically Signed   By: San Morelle M.D.   On: 12/01/2014 07:54    Scheduled Meds: . calcium-vitamin D  1 tablet Oral Q breakfast  . carvedilol  12.5 mg Oral BID WC  . ciprofloxacin  500 mg Oral BID  . docusate sodium  100 mg Oral BID  . doxazosin  4 mg Oral QHS  . feeding supplement (ENSURE)  1 Container Oral TID BM  . magnesium oxide  400 mg Oral BID  . metroNIDAZOLE  500 mg Oral TID  . polycarbophil  625 mg Oral Daily  . pravastatin  10 mg Oral QHS  . rivaroxaban  20 mg Oral Q supper  . sertraline  50 mg Oral q morning - 10a   Continuous Infusions: . sodium chloride 50 mL/hr at 12/02/14 0313       Time spent: 35 minutes    [facility] A  [facility] Pager [phone] If 7PM-7AM, please contact night-coverage at [email or web address], password [facility] 12/02/2014, 1:23 PM

## 2014-12-02 NOTE — Progress Notes (Signed)
At [facility] pt became agitated and restless and was found walking off unit to go smoke by unit Network engineer. Pt was talked back to his room and RN explained that patient can not go off unit to smoke, and is not allowed to smoke on hospital campus. Advised RN would call MD for nicotene patch for patient. Pt refused idea of nicotene patch. Pt demanding he would go smoke regardless and attempted to walk off unit through emergency doors 2 other times. Security came to floor and with RN was explained that if patient left the unit and went outside he would be discharged against medical advice and would not be allowed to return to his room as a patient. Pt advised He would need to be re-admitted through ER. Patient verbalized understanding and other than being aggitated is alert and oriented. Spoke with NP Rogue Bussing who ordered nicotene patch and advised pt to remain in hospital as he has stress test scheduled for am. Pt informed of orders and need for test but stated, "I don't need that test, they ain't going to find nothing." Pt stated he did not have a ride to his former facility and understands hospital would not be able to arrange a ride for him. Pt stated he did not care, he would walk if he had to. Pt took off tele monitor, RN collected from him. Pt stated, "I am going to smoke. I have not had a cigarette in 3 days and if that means I have to leave then I will." RN and charge RN tried to talk with patient again but he refused to stay. NP Rogue Bussing informed of pt decision, CCMD notified, IV was d/c'd. Patient in right mind with full understanding signed AMA form and was escorted outside hospital with belongings in bag. Marland Kitchen

## 2014-12-02 NOTE — Progress Notes (Signed)
Bright red blood noted in [facility], asked patient if he is aware of the bleeding and he stated having this problem for a while, unable to assess sacrum, patient was back in the bed fully dressed when blood was noticed, reported to nurse

## 2014-12-02 NOTE — Progress Notes (Signed)
Pt transferred by w/c to 2W15, tolerated transfer well, report given to RN.

## 2014-12-02 NOTE — Progress Notes (Signed)
12/02/2014 02:30 AM Pt transferred from [address] to room 2W15.  Pt alert and oriented and experiencing no pain.  Will continue to monitor. [name]

## 2014-12-03 ENCOUNTER — Other Ambulatory Visit: Payer: Self-pay | Admitting: Radiology

## 2014-12-04 ENCOUNTER — Emergency Department (HOSPITAL_COMMUNITY)
Admission: EM | Admit: 2014-12-04 | Discharge: 2014-12-05 | Disposition: A | Discharge status: Home / Self Care | Payer: Medicare Other | Attending: Emergency Medicine | Admitting: Emergency Medicine

## 2014-12-04 ENCOUNTER — Encounter (HOSPITAL_COMMUNITY): Payer: Self-pay | Admitting: Emergency Medicine

## 2014-12-04 DIAGNOSIS — M109 Gout, unspecified: Secondary | ICD-10-CM | POA: Diagnosis not present

## 2014-12-04 DIAGNOSIS — E785 Hyperlipidemia, unspecified: Secondary | ICD-10-CM | POA: Insufficient documentation

## 2014-12-04 DIAGNOSIS — G43909 Migraine, unspecified, not intractable, without status migrainosus: Secondary | ICD-10-CM | POA: Insufficient documentation

## 2014-12-04 DIAGNOSIS — Z79899 Other long term (current) drug therapy: Secondary | ICD-10-CM | POA: Insufficient documentation

## 2014-12-04 DIAGNOSIS — R079 Chest pain, unspecified: Secondary | ICD-10-CM

## 2014-12-04 DIAGNOSIS — F329 Major depressive disorder, single episode, unspecified: Secondary | ICD-10-CM | POA: Insufficient documentation

## 2014-12-04 DIAGNOSIS — Z720 Tobacco use: Secondary | ICD-10-CM | POA: Insufficient documentation

## 2014-12-04 NOTE — ED Notes (Signed)
Pt arrives from [facility] by [facility] for c/o chest pressure that began around 2100 tonight. Pain is reproduceable and pt also reports coughing up some white phlegm, although he is unsure how long this has been going on. Pt is Afib on the monitor with hx of such. Pt received 1SL nitro. Alert, oriented,

## 2014-12-04 NOTE — ED Notes (Signed)
Pt now reports numbness and tingling down his left arm x2 weeks, tonight he was watching tv when it began. Denies sweating, sob, n/v.

## 2014-12-04 NOTE — ED Notes (Signed)
MD at bedside. 

## 2014-12-05 ENCOUNTER — Encounter (HOSPITAL_COMMUNITY): Payer: Self-pay

## 2014-12-05 ENCOUNTER — Ambulatory Visit (HOSPITAL_COMMUNITY)
Admission: RE | Admit: 2014-12-05 | Discharge: 2014-12-05 | Disposition: A | Discharge status: Home / Self Care | Payer: Medicare Other | Source: Ambulatory Visit | Attending: Hematology | Admitting: Hematology

## 2014-12-05 DIAGNOSIS — C8580 Other specified types of non-Hodgkin lymphoma, unspecified site: Secondary | ICD-10-CM | POA: Insufficient documentation

## 2014-12-05 DIAGNOSIS — R079 Chest pain, unspecified: Secondary | ICD-10-CM | POA: Diagnosis not present

## 2014-12-05 DIAGNOSIS — C884 Extranodal marginal zone B-cell lymphoma of mucosa-associated lymphoid tissue [MALT-lymphoma]: Secondary | ICD-10-CM | POA: Insufficient documentation

## 2014-12-05 LAB — CBC WITH DIFFERENTIAL/PLATELET
BASOS PCT: 0 % (ref 0–1)
Basophils Absolute: 0 10*3/uL (ref 0.0–0.1)
Basophils Absolute: 0 10*3/uL (ref 0.0–0.1)
Basophils Relative: 0 % (ref 0–1)
EOS ABS: 0.1 10*3/uL (ref 0.0–0.7)
Eosinophils Absolute: 0.2 10*3/uL (ref 0.0–0.7)
Eosinophils Relative: 2 % (ref 0–5)
Eosinophils Relative: 3 % (ref 0–5)
HCT: 34.3 % — ABNORMAL LOW (ref 39.0–52.0)
HCT: 34.4 % — ABNORMAL LOW (ref 39.0–52.0)
Hemoglobin: 10.8 g/dL — ABNORMAL LOW (ref 13.0–17.0)
Hemoglobin: 10.9 g/dL — ABNORMAL LOW (ref 13.0–17.0)
LYMPHS PCT: 38 % (ref 12–46)
Lymphocytes Relative: 44 % (ref 12–46)
Lymphs Abs: 2.5 10*3/uL (ref 0.7–4.0)
Lymphs Abs: 2.9 10*3/uL (ref 0.7–4.0)
MCH: 30.9 pg (ref 26.0–34.0)
MCH: 30.8 pg (ref 26.0–34.0)
MCHC: 31.5 g/dL (ref 30.0–36.0)
MCHC: 31.7 g/dL (ref 30.0–36.0)
MCV: 97.2 fL (ref 78.0–100.0)
MCV: 98 fL (ref 78.0–100.0)
MONO ABS: 0.6 10*3/uL (ref 0.1–1.0)
Monocytes Absolute: 0.4 10*3/uL (ref 0.1–1.0)
Monocytes Relative: 6 % (ref 3–12)
Monocytes Relative: 8 % (ref 3–12)
NEUTROS ABS: 2.8 10*3/uL (ref 1.7–7.7)
NEUTROS ABS: 3.9 10*3/uL (ref 1.7–7.7)
NEUTROS PCT: 48 % (ref 43–77)
Neutrophils Relative %: 51 % (ref 43–77)
PLATELETS: 171 10*3/uL (ref 150–400)
PLATELETS: 170 10*3/uL (ref 150–400)
RBC: 3.5 MIL/uL — ABNORMAL LOW (ref 4.22–5.81)
RBC: 3.54 MIL/uL — AB (ref 4.22–5.81)
RDW: 16.8 % — ABNORMAL HIGH (ref 11.5–15.5)
RDW: 16.9 % — ABNORMAL HIGH (ref 11.5–15.5)
WBC: 5.8 10*3/uL (ref 4.0–10.5)
WBC: 7.6 10*3/uL (ref 4.0–10.5)

## 2014-12-05 LAB — PROTIME-INR
INR: 1.16 (ref 0.00–1.49)
Prothrombin Time: 15 seconds (ref 11.6–15.2)

## 2014-12-05 LAB — BASIC METABOLIC PANEL
Anion gap: 8 (ref 5–15)
BUN: 29 mg/dL — AB (ref 6–20)
CHLORIDE: 104 mmol/L (ref 101–111)
CO2: 25 mmol/L (ref 22–32)
CREATININE: 0.76 mg/dL (ref 0.61–1.24)
Calcium: 8.9 mg/dL (ref 8.9–10.3)
GLUCOSE: 130 mg/dL — AB (ref 65–99)
Potassium: 4 mmol/L (ref 3.5–5.1)
Sodium: 137 mmol/L (ref 135–145)

## 2014-12-05 LAB — TROPONIN I: Troponin I: <0.03 ng/mL (ref <0.031)

## 2014-12-05 LAB — BONE MARROW EXAM

## 2014-12-05 LAB — APTT: APTT: 38 s — AB (ref 24–37)

## 2014-12-05 MED ORDER — FENTANYL CITRATE (PF) 100 MCG/2ML IJ SOLN
INTRAMUSCULAR | Status: AC
  Filled 2014-12-05: qty 2

## 2014-12-05 MED ORDER — MIDAZOLAM HCL 2 MG/2ML IJ SOLN
INTRAMUSCULAR | Status: AC
  Filled 2014-12-05: qty 2

## 2014-12-05 MED ORDER — HYDROCODONE-ACETAMINOPHEN 5-325 MG PO TABS
1.0000 | ORAL_TABLET | ORAL | Status: DC | PRN
  Filled 2014-12-05: qty 2

## 2014-12-05 MED ORDER — HYDROCODONE-ACETAMINOPHEN 5-325 MG PO TABS
2.0000 | ORAL_TABLET | Freq: Once | ORAL | Status: AC
  Administered 2014-12-05: 2 via ORAL
  Filled 2014-12-05: qty 2

## 2014-12-05 MED ORDER — SODIUM CHLORIDE 0.9 % IV SOLN
INTRAVENOUS | Status: DC
  Administered 2014-12-05: 10:00:00 via INTRAVENOUS

## 2014-12-05 MED ORDER — HYDROCODONE-ACETAMINOPHEN 5-325 MG PO TABS: 1.0000 | ORAL_TABLET | Freq: Two times a day (BID) | ORAL | Status: DC | PRN

## 2014-12-05 MED ORDER — FENTANYL CITRATE (PF) 100 MCG/2ML IJ SOLN
INTRAMUSCULAR | Status: AC | PRN
  Administered 2014-12-05: 25 ug via INTRAVENOUS

## 2014-12-05 MED ORDER — MIDAZOLAM HCL 2 MG/2ML IJ SOLN
INTRAMUSCULAR | Status: AC | PRN
  Administered 2014-12-05: 0.5 mg via INTRAVENOUS

## 2014-12-05 NOTE — H&P (Signed)
Chief Complaint: Omental marginal zone B-Cell Lymphoma  Referring Physician(s): Tripp,Henry Dr. Burr Medico  History of Present Illness: [name] is a 69 y.o. 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  We are asked to perform a CT guided bone marrow biopsy today to evaluate for marrow involvement.    Past Medical History  Diagnosis Date  . Depression   . Hypomagnesemia   . Fall at nursing home 05/27/2013    "slipped on water; said I broke my right hip" (05/27/2013)  . Migraine     "used to have them bad; haven't had one in awhile" (05/27/2013)  . Gout attack     "not too long ago; in my right foot" (05/27/2013)  . Complete heart block     reason for pacemaker/notes 05/27/2013  . Atrial fibrillation     [name] 05/27/2013  . HLD (hyperlipidemia)   . GIB (gastrointestinal bleeding)   . BPH (benign prostatic hyperplasia)   . Presence of permanent cardiac pacemaker     Past Surgical History  Procedure Laterality Date  . Insert / replace / remove pacemaker    . Appendectomy    . Cholecystectomy    . Open reduction of hip Right 05/28/2013    Procedure: OPEN REDUCTION OF HIP;  Surgeon: Renette Butters, MD;  Location: [place];  Service: Orthopedics;  Laterality: Right;  . Esophagogastroduodenoscopy (egd) with propofol N/A 09/26/2014    Procedure: ESOPHAGOGASTRODUODENOSCOPY (EGD) WITH PROPOFOL;  Surgeon: Jerene Bears, MD;  Location: [facility] ENDOSCOPY;  Service: Endoscopy;  Laterality: N/A;  . Colonoscopy N/A 10/01/2014    Procedure: COLONOSCOPY;  Surgeon: Jerene Bears, MD;  Location[facility] ENDOSCOPY;  Service: Endoscopy;  Laterality: N/A;  . Partial colectomy N/A 10/05/2014    Procedure: SUBTOTAL COLECTOMY WITH ILEORECTAL ANASTOMOSIS;  Surgeon: Donnie Mesa, MD;  Location: [place];  Service: General;   Laterality: N/A;  . Flexible sigmoidoscopy N/A 11/07/2014    Procedure: FLEXIBLE SIGMOIDOSCOPY;  Surgeon: Jerene Bears, MD;  Location: [facility] ENDOSCOPY;  Service: Endoscopy;  Laterality: N/A;    Allergies: Review of patient's allergies indicates no known allergies.  Medications: Prior to Admission medications   Medication Sig Start Date End Date Taking? Authorizing Provider  carvedilol (COREG) 6.25 MG tablet Take 1 tablet (6.25 mg total) by mouth 2 (two) times daily with a meal. 11/16/14  Yes Velvet Bathe, MD  ciprofloxacin (CIPRO) 500 MG tablet Take 1 tablet (500 mg total) by mouth 2 (two) times daily. One po bid x 7 days 11/29/14  Yes Veryl Speak, MD  colchicine 0.6 MG tablet Take 1.2 mg by mouth once.   Yes Historical Provider, MD  loperamide (IMODIUM A-D) 2 MG tablet Take 2 mg by mouth 4 (four) times daily as needed for diarrhea or loose stools.   Yes Historical Provider, MD  metroNIDAZOLE (FLAGYL) 500 MG tablet Take 1 tablet (500 mg total) by mouth 3 (three) times daily. One po bid x 7 days 11/29/14  Yes Veryl Speak, MD  pravastatin (PRAVACHOL) 10 MG tablet Take 10 mg by mouth at bedtime.   Yes Historical Provider, MD  traMADol (ULTRAM) 50 MG tablet Take 50 mg by mouth every 6 (six) hours as needed for moderate pain.   Yes Historical Provider, MD  zolpidem (AMBIEN) 5 MG tablet Take 5 mg by  mouth at bedtime.   Yes Historical Provider, MD  Calcium Carbonate-Vit D-Min (CALTRATE 600+D PLUS MINERALS) 600-800 MG-UNIT TABS Take 1 tablet by mouth daily.    Historical Provider, MD  carvedilol (COREG) 12.5 MG tablet Take 12.5 mg by mouth 2 (two) times daily with a meal.    Historical Provider, MD  docusate sodium (COLACE) 100 MG capsule Take 100 mg by mouth 2 (two) times daily.    Historical Provider, MD  doxazosin (CARDURA) 4 MG tablet Take 4 mg by mouth daily.    Historical Provider, MD  feeding supplement, ENSURE, (ENSURE) PUDG Take 1 Container by mouth 3 (three) times daily between meals. Patient not  taking: Reported on 11/29/2014 11/09/14   Modena Jansky, MD  HYDROcodone-acetaminophen (NORCO/VICODIN) 5-325 MG per tablet Take 1 tablet by mouth 2 (two) times daily as needed for severe pain. 12/05/14   Everlene Balls, MD  magnesium oxide (MAG-OX) 400 MG tablet Take 400 mg by mouth 2 (two) times daily.    Historical Provider, MD  polycarbophil (FIBERCON) 625 MG tablet Take 1 tablet (625 mg total) by mouth daily. Patient not taking: Reported on 11/29/2014 11/09/14   Modena Jansky, MD  rivaroxaban (XARELTO) 20 MG TABS tablet Take 20 mg by mouth daily.    Historical Provider, MD  sertraline (ZOLOFT) 50 MG tablet Take 50 mg by mouth every morning.     Historical Provider, MD     Family History  Problem Relation Age of Onset  . Colon cancer Mother     History   Social History  . Marital Status: Divorced    Spouse Name: N/A  . Number of Children: N/A  . Years of Education: N/A   Social History Main Topics  . Smoking status: Current Some Day Smoker -- 0.50 packs/day for 66 years    Types: Cigarettes  . Smokeless tobacco: Former Systems developer    Types: Snuff, Chew     Comment: 05/27/2013 "haven't used chew or snuff in ~ 30 yr"  . Alcohol Use: No     Comment: 05/27/2013 "none in years; never had problem w/it" - not in 30 years  . Drug Use: No  . Sexual Activity: No   Other Topics Concern  . None   Social History Narrative    Review of Systems  Constitutional: Negative for fever, activity change, appetite change and fatigue.  HENT: Negative.   Respiratory: Negative for chest tightness and shortness of breath.   Cardiovascular: Negative for chest pain.  Gastrointestinal: Negative for nausea, vomiting and abdominal pain.  Musculoskeletal: Negative.   Skin: Negative.   Neurological: Negative.   Psychiatric/Behavioral: Negative.     Vital Signs: 128/49 P 74 T 99 SpO2 100%  Physical Exam  Constitutional: He is oriented to person, place, and time. He appears well-developed and  well-nourished.  HENT:  Head: Normocephalic and atraumatic.  Eyes: EOM are normal.  Neck: Normal range of motion. Neck supple.  Cardiovascular: Normal rate, regular rhythm and normal heart sounds.   Pacemaker in place  Pulmonary/Chest: Effort normal and breath sounds normal.  Abdominal: Soft. Bowel sounds are normal.  Musculoskeletal: Normal range of motion.  Neurological: He is alert and oriented to person, place, and time.  Skin: Skin is warm.  Psychiatric: He has a normal mood and affect. His behavior is normal. Judgment and thought content normal.  Vitals reviewed.   Mallampati Score:  MD Evaluation Airway: WNL Heart: WNL Abdomen: WNL Chest/ Lungs: WNL ASA  Classification: 3 Mallampati/Airway Score:  One  Imaging: Dg Chest 2 View  12/01/2014   CLINICAL DATA:  Left-sided chest pain.  Shortness of breath.  EXAM: CHEST - 2 VIEW  COMPARISON:  Two-view chest x-ray 11/25/2014  FINDINGS: The heart size is normal. A dual lead pacemaker is stable in position. Atherosclerotic changes are again noted at the aortic arch. Emphysematous changes are present. No focal airspace disease is evident. There is no edema or effusion to suggest failure. The visualized soft tissues and bony thorax are unremarkable.  IMPRESSION: 1. No acute cardiopulmonary disease. Imaging findings of potential clinical significance:  1. Emphysema 2. Atherosclerosis of the thoracic aorta.   Electronically Signed   By: San Morelle M.D.   On: 12/01/2014 07:54   Dg Chest 2 View  11/25/2014   CLINICAL DATA:  Chest pain starting this morning  EXAM: CHEST  2 VIEW  COMPARISON:  11/23/2014  FINDINGS: Dual lead pacer with proximal and distal leads projecting over the right atrium and ventricle, respectively.  Atherosclerotic calcification of the aortic arch.  Emphysema noted.  Sclerotic density in the right fourth rib for related to remote fracture.  The lungs appear otherwise clear.  Heart size within normal limits.   IMPRESSION: 1. Emphysema. 2. Atherosclerosis. 3. No acute findings.   Electronically Signed   By: Van Clines M.D.   On: 11/25/2014 11:20   Dg Lumbar Spine Complete  11/24/2014   CLINICAL DATA:  Patient was found on the floor in the bathroom. Patient lost balance and fell. Low back pain.  EXAM: LUMBAR SPINE - COMPLETE 4+ VIEW  COMPARISON:  CT abdomen and pelvis 09/30/2014  FINDINGS: Diffuse bone demineralization. Normal alignment of the lumbar spine. No vertebral compression deformities. Degenerative disc disease at the lumbosacral interspace. No focal bone lesion or bone destruction. Vascular calcifications. Surgical clips in the right upper quadrant.  IMPRESSION: Degenerative changes in the spine. Normal alignment. No acute displaced fractures.   Electronically Signed   By: Lucienne Capers M.D.   On: 11/24/2014 06:52   Ct Head Wo Contrast  11/06/2014   CLINICAL DATA:  Fall, on Xarelto  EXAM: CT HEAD WITHOUT CONTRAST  TECHNIQUE: Contiguous axial images were obtained from the base of the skull through the vertex without intravenous contrast.  COMPARISON:  10/27/2006  FINDINGS: Motion degraded images.  No evidence of parenchymal hemorrhage or extra-axial fluid collection. No mass lesion, mass effect, or midline shift.  No CT evidence of acute infarction.  Subcortical white matter and periventricular small vessel ischemic changes. Intracranial atherosclerosis.  Global cortical atrophy.  No ventriculomegaly.  The visualized paranasal sinuses are essentially clear. The mastoid air cells are unopacified.  No evidence of calvarial fracture.  IMPRESSION: No evidence of acute intracranial abnormality.  Atrophy with small vessel ischemic changes.   Electronically Signed   By: Julian Hy M.D.   On: 11/06/2014 10:08   Ct Chest W Contrast  11/23/2014   CLINICAL DATA:  Subsequent encounter forB-cell lymphoma  EXAM: CT CHEST WITH CONTRAST  TECHNIQUE: Multidetector CT imaging of the chest was performed  during intravenous contrast administration.  CONTRAST:  91m OMNIPAQUE IOHEXOL 300 MG/ML  SOLN  COMPARISON:  10/27/2006  FINDINGS: Mediastinum / Lymph Nodes: Right-sided permanent pacemaker again noted. No axillary lymphadenopathy. No mediastinal or hilar lymphadenopathy. Heart size is normal. No Evidence of pericardial effusion.  Lungs / Pleura: Centrilobular emphysema noted bilaterally with chronic bronchial wall thickening. No focal airspace consolidation. No suspicious pulmonary parenchymal nodule or mass. No pulmonary edema or pleural effusion.  MSK / Soft Tissues: Bone windows reveal no worrisome lytic or sclerotic osseous lesions.  Upper Abdomen: 16 right adrenal nodule is stable since 09/30/2014 but slightly larger than 10/27/2006. Relative enhancement washout views 44% which is borderline for adrenal adenoma.  IMPRESSION: No acute findings in the chest.  16 mm right adrenal nodule has washout characteristics borderline for benign adrenal adenoma. Attention on followup imaging is recommended. If there is clinical concern to further evaluate this finding more urgently, MRI with in and out of phase imaging is recommended.   Electronically Signed   By: Misty Stanley M.D.   On: 11/23/2014 12:19   Ct Abdomen Pelvis W Contrast  11/29/2014   CLINICAL DATA:  Acute onset of epigastric abdominal pain and blood in stool. Initial encounter.  EXAM: CT ABDOMEN AND PELVIS WITH CONTRAST  TECHNIQUE: Multidetector CT imaging of the abdomen and pelvis was performed using the standard protocol following bolus administration of intravenous contrast.  CONTRAST:  17m OMNIPAQUE IOHEXOL 300 MG/ML  SOLN  COMPARISON:  CT of the abdomen and pelvis performed 09/30/2014  FINDINGS: The visualized lung bases are clear. A right ventricular lead is partially imaged.  Mild prominence of the intrahepatic biliary ducts is within normal limits status post cholecystectomy. Clips are noted at the gallbladder fossa. The gallbladder is within  normal limits. The pancreas and adrenal glands are unremarkable.  The kidneys are unremarkable in appearance. There is no evidence of hydronephrosis. No renal or ureteral stones are seen. No perinephric stranding is appreciated.  No free fluid is identified. The small bowel is unremarkable in appearance. The stomach is within normal limits. No acute vascular abnormalities are seen.  Diffuse calcification is noted along the abdominal aorta and its branches, including at the proximal renal arteries and along the superior mesenteric artery. There appears to be marked luminal narrowing along the right common iliac artery, though there is reconstitution of flow within the right external iliac artery.  The patient is status post appendectomy. Much of the colon has been resected, with the ileocolic anastomosis at the sigmoid colon. Vague soft tissue edema is suggested about the remaining sigmoid colon, possibly reflecting a mild infectious or inflammatory process. Contrast progresses to the level of the rectum.  The bladder is decompressed and not well assessed. Bladder wall thickening is nonspecific. The prostate remains normal in size. No inguinal lymphadenopathy is seen.  No acute osseous abnormalities are identified. An intramedullary rod and screw are noted within the proximal right femur.  IMPRESSION: 1. Vague soft tissue edema suggested about the remaining sigmoid colon, possibly reflecting a mild infectious or inflammatory process. Ileocolic anastomosis is unremarkable in appearance. 2. Nonspecific bladder wall thickening. Would correlate for any evidence of cystitis. 3. Diffuse calcification along the abdominal aorta and its branches, including at the proximal renal arteries and along the superior mesenteric artery. Apparent marked luminal narrowing along the right common iliac artery, though there is reconstitution of flow within the right external iliac artery.   Electronically Signed   By: JGarald BaldingM.D.    On: 11/29/2014 06:25   Dg Hip Unilat With Pelvis 2-3 Views Left  11/06/2014   CLINICAL DATA:  Dizziness and fall with left hip pain.  EXAM: LEFT HIP (WITH PELVIS) 2-3 VIEWS  COMPARISON:  05/28/2013 and CT 09/30/2014  FINDINGS: Examination demonstrates diffuse decreased bone mineralization. There are mild symmetric degenerative changes of the hips. Fixation hardware over the right femur is intact and unchanged. No evidence of acute fracture dislocation of  the left hip/pelvis. There are degenerative changes of the spine as well as calcified plaque over the abdominal aorta and iliac arteries.  IMPRESSION: No acute findings.   Electronically Signed   By: Marin Olp M.D.   On: 11/06/2014 10:13    Labs:  CBC:  Recent Labs  12/02/14 0432 12/02/14 0616 12/04/14 2344 12/05/14 0935  WBC 5.0 5.9 7.6 5.8  HGB 10.1* 10.1* 10.9* 10.8*  HCT 32.6* 32.1* 34.4* 34.3*  PLT 159 162 170 171    COAGS:  Recent Labs  09/30/14 0253 11/06/14 0935 11/14/14 1835 12/01/14 0654 12/02/14 0432 12/05/14 0935  INR  --  1.54* 1.22 1.19  --  1.16  APTT 48*  --   --   --  49* 38*    BMP:  Recent Labs  11/29/14 0238 12/01/14 0654 12/02/14 0616 12/04/14 2344  NA 137 140 134* 137  K 3.6 4.3 4.2 4.0  CL 106 109 103 104  CO2 _0 GLUCOSE 115* 108* 136* 130*  BUN 17 22* 19 29*  CALCIUM 9.0 8.9 8.3* 8.9  CREATININE 0.65 0.73 0.73 0.76  GFRNONAA >60 >60 >60 >60  GFRAA >60 >60 >60 >60    LIVER FUNCTION TESTS:  Recent Labs  11/19/14 0142 11/24/14 0118 11/29/14 0238 12/01/14 0654  BILITOT 0.1* 0.4 0.5 0.3  AST 15 14* 16 13*  ALT 12* 10* 12* 9*  ALKPHOS 90 92 89 83  PROT 6.3* 6.9 7.4 7.0  ALBUMIN 3.1* 3.2* 3.6 3.2*    TUMOR MARKERS:  Recent Labs  10/01/14 1850  CEA 2.9    Assessment and Plan:  S/P Sub-Total Colectomy with finding of B-Cell Lymphoma  Will proceed with CT guided bone marrow biopsy by Dr. Vernard Gambles today.  Risks and Benefits discussed with the patient  including, but not limited to bleeding, infection, damage to adjacent structures or low yield requiring additional tests. All of the patient's questions were answered, patient is agreeable to proceed. Consent signed and in chart.  Thank you for this interesting consult.  I greatly enjoyed meeting MONTRAIL MEHRER and look forward to participating in their care.  SignedMurrell Redden 12/05/2014, 10:57 AM   I spent a total of  20 Minutes   in face to face in clinical consultation, greater than 50% of which was counseling/coordinating care for CT guided Bone Marrow Biopsy.

## 2014-12-05 NOTE — Discharge Instructions (Signed)
Conscious Sedation Sedation is the use of medicines to promote relaxation and relieve discomfort and anxiety. Conscious sedation is a type of sedation. Under conscious sedation you are less alert than normal but are still able to respond to instructions or stimulation. Conscious sedation is used during short medical and dental procedures. It is milder than deep sedation or general anesthesia and allows you to return to your regular activities sooner.  LET [facility] CARE PROVIDER KNOW ABOUT:   Any allergies you have.  All medicines you are taking, including vitamins, herbs, eye drops, creams, and over-the-counter medicines.  Use of steroids (by mouth or creams).  Previous problems you or members of your family have had with the use of anesthetics.  Any blood disorders you have.  Previous surgeries you have had.  Medical conditions you have.  Possibility of pregnancy, if this applies.  Use of cigarettes, alcohol, or illegal drugs. RISKS AND COMPLICATIONS Generally, this is a safe procedure. However, as with any procedure, problems can occur. Possible problems include:  Oversedation.  Trouble breathing on your own. You may need to have a breathing tube until you are awake and breathing on your own.  Allergic reaction to any of the medicines used for the procedure. BEFORE THE PROCEDURE  You may have blood tests done. These tests can help show how well your kidneys and liver are working. They can also show how well your blood clots.  A physical exam will be done.  Only take medicines as directed by your health care provider. You may need to stop taking medicines (such as blood thinners, aspirin, or nonsteroidal anti-inflammatory drugs) before the procedure.   Do not eat or drink at least 6 hours before the procedure or as directed by your health care provider.  Arrange for a responsible adult, family member, or friend to take you home after the procedure. He or she should stay  with you for at least 24 hours after the procedure, until the medicine has worn off. PROCEDURE   An intravenous (IV) catheter will be inserted into one of your veins. Medicine will be able to flow directly into your body through this catheter. You may be given medicine through this tube to help prevent pain and help you relax.  The medical or dental procedure will be done. AFTER THE PROCEDURE  You will stay in a recovery area until the medicine has worn off. Your blood pressure and pulse will be checked.   Depending on the procedure you had, you may be allowed to go home when you can tolerate liquids and your pain is under control. Document Released: 02/19/2001 Document Revised: 06/01/2013 Document Reviewed: 02/01/2013 [facility] Patient Information 2015 [place], [place]. This information is not intended to replace advice given to you by your health care provider. Make sure you discuss any questions you have with your health care provider. Bone Marrow Aspiration, Bone Marrow Biopsy Care After Read the instructions outlined below and refer to this sheet in the next few weeks. These discharge instructions provide you with general information on caring for yourself after you leave the hospital. Your caregiver may also give you specific instructions. While your treatment has been planned according to the most current medical practices available, unavoidable complications occasionally occur. If you have any problems or questions after discharge, call your caregiver. FINDING OUT THE RESULTS OF YOUR TEST Not all test results are available during your visit. If your test results are not back during the visit, make an appointment with your  your caregiver to find out the results. Do not assume everything is normal if you have not heard from your caregiver or the medical facility. It is important for you to follow up on all of your test results.  °HOME CARE INSTRUCTIONS  °You have had sedation and may be sleepy or  dizzy. Your thinking may not be as clear as usual. For the next 24 hours: °· Only take over-the-counter or prescription medicines for pain, discomfort, and or fever as directed by your caregiver. °· Do not drink alcohol. °· Do not smoke. °· Do not drive. °· Do not make important legal decisions. °· Do not operate heavy machinery. °· Do not care for small children by yourself. °· Keep your dressing clean and dry. You may replace dressing with a bandage after 24 hours. °· You may take a bath or shower after 24 hours. °· Use an ice pack for 20 minutes every 2 hours while awake for pain as needed. °SEEK MEDICAL CARE IF:  °· There is redness, swelling, or increasing pain at the biopsy site. °· There is pus coming from the biopsy site. °· There is drainage from a biopsy site lasting longer than one day. °· An unexplained oral temperature above 102° F (38.9° C) develops. °SEEK IMMEDIATE MEDICAL CARE IF:  °· You develop a rash. °· You have difficulty breathing. °· You develop any reaction or side effects to medications given. °Document Released: 12/14/2004 Document Revised: 08/19/2011 Document Reviewed: 05/24/2008 °[employer]® Patient Information ©2015 [employer]. This information is not intended to replace advice given to you by your health care provider. Make sure you discuss any questions you have with your health care provider. ° °

## 2014-12-05 NOTE — Discharge Instructions (Signed)

## 2014-12-05 NOTE — ED Provider Notes (Signed)
CSN: [id number]     Arrival date & time 12/04/14  2204 History   First MD Initiated Contact with Patient 12/04/14 2206     Chief Complaint  Patient presents with  . Chest Pain     (Consider location/radiation/quality/duration/timing/severity/associated sxs/prior Treatment) Patient is a 69 y.o. male presenting with chest pain. The history is provided by the patient. No language interpreter was used.  Chest Pain Pain location:  L chest and R chest Pain quality: aching   Pain radiates to:  Does not radiate Pain severity:  Moderate Onset quality:  Gradual Timing:  Constant Progression:  Worsening Chronicity:  New Context: at rest   Relieved by:  Nothing Worsened by:  Nothing tried Ineffective treatments:  None tried Risk factors: hypertension   Pt just admitted for chest pain.  Pt had negative troponin's. Normal echo, cardiology consult.   Pt has had multiple episodes of chest pain.   Pt reports he had has chest pain for several days.  Past Medical History  Diagnosis Date  . Depression   . Hypomagnesemia   . Fall at nursing home 05/27/2013    "slipped on water; said I broke my right hip" (05/27/2013)  . Migraine     "used to have them bad; haven't had one in awhile" (05/27/2013)  . Gout attack     "not too long ago; in my right foot" (05/27/2013)  . Complete heart block     reason for pacemaker/notes 05/27/2013  . Atrial fibrillation     [name] 05/27/2013  . HLD (hyperlipidemia)   . GIB (gastrointestinal bleeding)   . BPH (benign prostatic hyperplasia)    Past Surgical History  Procedure Laterality Date  . Insert / replace / remove pacemaker    . Appendectomy    . Cholecystectomy    . Open reduction of hip Right 05/28/2013    Procedure: OPEN REDUCTION OF HIP;  Surgeon: Renette Butters, MD;  Location: [place];  Service: Orthopedics;  Laterality: Right;  . Esophagogastroduodenoscopy (egd) with propofol N/A 09/26/2014    Procedure: ESOPHAGOGASTRODUODENOSCOPY (EGD) WITH  PROPOFOL;  Surgeon: Jerene Bears, MD;  Location: [facility] ENDOSCOPY;  Service: Endoscopy;  Laterality: N/A;  . Colonoscopy N/A 10/01/2014    Procedure: COLONOSCOPY;  Surgeon: Jerene Bears, MD;  Location: [facility] ENDOSCOPY;  Service: Endoscopy;  Laterality: N/A;  . Partial colectomy N/A 10/05/2014    Procedure: SUBTOTAL COLECTOMY WITH ILEORECTAL ANASTOMOSIS;  Surgeon: Donnie Mesa, MD;  Location: [place];  Service: General;  Laterality: N/A;  . Flexible sigmoidoscopy N/A 11/07/2014    Procedure: FLEXIBLE SIGMOIDOSCOPY;  Surgeon: Jerene Bears, MD;  Location: [facility] ENDOSCOPY;  Service: Endoscopy;  Laterality: N/A;   Family History  Problem Relation Age of Onset  . Colon cancer Mother    History  Substance Use Topics  . Smoking status: Current Some Day Smoker -- 0.50 packs/day for 66 years    Types: Cigarettes  . Smokeless tobacco: Former Systems developer    Types: Snuff, Chew     Comment: 05/27/2013 "haven't used chew or snuff in ~ 30 yr"  . Alcohol Use: No     Comment: 05/27/2013 "none in years; never had problem w/it" - not in 30 years    Review of Systems  Cardiovascular: Positive for chest pain.  All other systems reviewed and are negative.     Allergies  Review of patient's allergies indicates no known allergies.  Home Medications   Prior to Admission medications   Medication Sig Start Date End  Date Taking? Authorizing Provider  Calcium Carbonate-Vit D-Min (CALTRATE 600+D PLUS MINERALS) 600-800 MG-UNIT TABS Take 1 tablet by mouth daily.    Historical Provider, MD  carvedilol (COREG) 12.5 MG tablet Take 12.5 mg by mouth 2 (two) times daily with a meal.    Historical Provider, MD  carvedilol (COREG) 6.25 MG tablet Take 1 tablet (6.25 mg total) by mouth 2 (two) times daily with a meal. 11/16/14   Velvet Bathe, MD  ciprofloxacin (CIPRO) 500 MG tablet Take 1 tablet (500 mg total) by mouth 2 (two) times daily. One po bid x 7 days 11/29/14   Veryl Speak, MD  colchicine 0.6 MG tablet Take 1.2 mg by mouth once.     Historical Provider, MD  docusate sodium (COLACE) 100 MG capsule Take 100 mg by mouth 2 (two) times daily.    Historical Provider, MD  doxazosin (CARDURA) 4 MG tablet Take 4 mg by mouth daily.    Historical Provider, MD  feeding supplement, ENSURE, (ENSURE) PUDG Take 1 Container by mouth 3 (three) times daily between meals. Patient not taking: Reported on 11/29/2014 11/09/14   Modena Jansky, MD  loperamide (IMODIUM A-D) 2 MG tablet Take 2 mg by mouth 4 (four) times daily as needed for diarrhea or loose stools.    Historical Provider, MD  magnesium oxide (MAG-OX) 400 MG tablet Take 400 mg by mouth 2 (two) times daily.    Historical Provider, MD  metroNIDAZOLE (FLAGYL) 500 MG tablet Take 1 tablet (500 mg total) by mouth 3 (three) times daily. One po bid x 7 days 11/29/14   Veryl Speak, MD  polycarbophil (FIBERCON) 625 MG tablet Take 1 tablet (625 mg total) by mouth daily. Patient not taking: Reported on 11/29/2014 11/09/14   Modena Jansky, MD  pravastatin (PRAVACHOL) 10 MG tablet Take 10 mg by mouth at bedtime.    Historical Provider, MD  rivaroxaban (XARELTO) 20 MG TABS tablet Take 20 mg by mouth daily.    Historical Provider, MD  sertraline (ZOLOFT) 50 MG tablet Take 50 mg by mouth every morning.     Historical Provider, MD  traMADol (ULTRAM) 50 MG tablet Take 50 mg by mouth every 6 (six) hours as needed for moderate pain.    Historical Provider, MD  zolpidem (AMBIEN) 5 MG tablet Take 5 mg by mouth at bedtime.    Historical Provider, MD   BP 121/72 mmHg  Pulse 77  Temp(Src) 98.6 F (37 C)  Resp 16  SpO2 98% Physical Exam  Constitutional: He is oriented to person, place, and time. He appears well-developed and well-nourished.  HENT:  Head: Normocephalic.  Right Ear: External ear normal.  Left Ear: External ear normal.  Nose: Nose normal.  Mouth/Throat: Oropharynx is clear and moist.  Eyes: Conjunctivae and EOM are normal. Pupils are equal, round, and reactive to light.  Neck: Normal  range of motion.  Cardiovascular: Normal rate and normal heart sounds.   Pulmonary/Chest: Effort normal.  Abdominal: He exhibits no distension.  Musculoskeletal: Normal range of motion.  Neurological: He is alert and oriented to person, place, and time.  Skin: Skin is warm.  Psychiatric: He has a normal mood and affect.  Nursing note and vitals reviewed.   ED Course  Procedures (including critical care time) Labs Review Labs Reviewed  CBC WITH DIFFERENTIAL/PLATELET - Abnormal; Notable for the following:    RBC 3.54 (*)    Hemoglobin 10.9 (*)    HCT 34.4 (*)    RDW 16.8 (*)  All other components within normal limits  TROPONIN I  BASIC METABOLIC PANEL    Imaging Review No results found.   EKG Interpretation None    AFib 84, rightward axis nonspecific t waves, no change since previous  Qt 374  MDM   Final diagnoses:  Chest pain, unspecified chest pain type    Hydrocodone Reuse home medications    Fransico Meadow, PA-C 12/05/14 0048  Evelina Bucy, MD 12/06/14 [phone]

## 2014-12-05 NOTE — Procedures (Signed)
CT-guided  R iliac bone marrow aspiration and core biopsy No complication No blood loss. See complete dictation in [facility] PACS  

## 2014-12-06 ENCOUNTER — Emergency Department (HOSPITAL_COMMUNITY): Payer: Medicare Other

## 2014-12-06 ENCOUNTER — Other Ambulatory Visit: Payer: Self-pay

## 2014-12-06 ENCOUNTER — Emergency Department (HOSPITAL_COMMUNITY)
Admission: EM | Admit: 2014-12-06 | Discharge: 2014-12-07 | Disposition: A | Discharge status: Home / Self Care | Payer: Medicare Other | Attending: Emergency Medicine | Admitting: Emergency Medicine

## 2014-12-06 ENCOUNTER — Encounter (HOSPITAL_COMMUNITY): Payer: Self-pay | Admitting: Emergency Medicine

## 2014-12-06 DIAGNOSIS — N400 Enlarged prostate without lower urinary tract symptoms: Secondary | ICD-10-CM | POA: Insufficient documentation

## 2014-12-06 DIAGNOSIS — M109 Gout, unspecified: Secondary | ICD-10-CM | POA: Insufficient documentation

## 2014-12-06 DIAGNOSIS — R079 Chest pain, unspecified: Secondary | ICD-10-CM | POA: Diagnosis present

## 2014-12-06 DIAGNOSIS — E785 Hyperlipidemia, unspecified: Secondary | ICD-10-CM | POA: Insufficient documentation

## 2014-12-06 DIAGNOSIS — I10 Essential (primary) hypertension: Secondary | ICD-10-CM | POA: Insufficient documentation

## 2014-12-06 DIAGNOSIS — Z87828 Personal history of other (healed) physical injury and trauma: Secondary | ICD-10-CM | POA: Insufficient documentation

## 2014-12-06 DIAGNOSIS — I4891 Unspecified atrial fibrillation: Secondary | ICD-10-CM | POA: Diagnosis not present

## 2014-12-06 DIAGNOSIS — Z792 Long term (current) use of antibiotics: Secondary | ICD-10-CM | POA: Insufficient documentation

## 2014-12-06 DIAGNOSIS — R0602 Shortness of breath: Secondary | ICD-10-CM | POA: Insufficient documentation

## 2014-12-06 DIAGNOSIS — I482 Chronic atrial fibrillation, unspecified: Secondary | ICD-10-CM

## 2014-12-06 DIAGNOSIS — D649 Anemia, unspecified: Secondary | ICD-10-CM | POA: Diagnosis not present

## 2014-12-06 DIAGNOSIS — Z720 Tobacco use: Secondary | ICD-10-CM | POA: Insufficient documentation

## 2014-12-06 DIAGNOSIS — Z8719 Personal history of other diseases of the digestive system: Secondary | ICD-10-CM | POA: Diagnosis not present

## 2014-12-06 DIAGNOSIS — Z950 Presence of cardiac pacemaker: Secondary | ICD-10-CM | POA: Diagnosis not present

## 2014-12-06 DIAGNOSIS — Z8659 Personal history of other mental and behavioral disorders: Secondary | ICD-10-CM | POA: Diagnosis not present

## 2014-12-06 DIAGNOSIS — Z79899 Other long term (current) drug therapy: Secondary | ICD-10-CM | POA: Insufficient documentation

## 2014-12-06 DIAGNOSIS — R0789 Other chest pain: Secondary | ICD-10-CM | POA: Insufficient documentation

## 2014-12-06 LAB — BASIC METABOLIC PANEL WITH GFR
Anion gap: 7 (ref 5–15)
BUN: 27 mg/dL — ABNORMAL HIGH (ref 6–20)
CO2: 22 mmol/L (ref 22–32)
Calcium: 8.4 mg/dL — ABNORMAL LOW (ref 8.9–10.3)
Chloride: 108 mmol/L (ref 101–111)
Creatinine, Ser: 0.65 mg/dL (ref 0.61–1.24)
GFR calc Af Amer: >60 mL/min
GFR calc non Af Amer: >60 mL/min
Glucose, Bld: 109 mg/dL — ABNORMAL HIGH (ref 65–99)
Potassium: 3.6 mmol/L (ref 3.5–5.1)
Sodium: 137 mmol/L (ref 135–145)

## 2014-12-06 LAB — CBC
HCT: 30.3 % — ABNORMAL LOW (ref 39.0–52.0)
Hemoglobin: 9.6 g/dL — ABNORMAL LOW (ref 13.0–17.0)
MCH: 30.5 pg (ref 26.0–34.0)
MCHC: 31.7 g/dL (ref 30.0–36.0)
MCV: 96.2 fL (ref 78.0–100.0)
Platelets: 156 10*3/uL (ref 150–400)
RBC: 3.15 MIL/uL — ABNORMAL LOW (ref 4.22–5.81)
RDW: 17 % — AB (ref 11.5–15.5)
WBC: 6.4 10*3/uL (ref 4.0–10.5)

## 2014-12-06 LAB — I-STAT TROPONIN, ED: Troponin i, poc: 0 ng/mL (ref 0.00–0.08)

## 2014-12-06 NOTE — ED Provider Notes (Signed)
CSN: [id number]     Arrival date & time 12/06/14  2221 History  This chart was scribed for [name], MD by Randa Evens, ED Scribe. This patient was seen in room A03C/A03C and the patient's care was started at 11:41 PM.    Chief Complaint  Patient presents with  . Chest Pain   The history is provided by the patient. No language interpreter was used.   HPI Comments: [name] is a 69 y.o. male brought in by ambulance, who presents to the Emergency Department complaining of chest pain onset 2 hours PTA. Pt states that the pain was 9/10, but now it has completely resolved. Pt states that he was having associated SOB. Pt states that onset of pain was at rest tonight while lying on the bed. Pt doesn't report any alleviation or worsening factors. Per EMS pt was given aspirin, nitroglycerin and solumedrol PTA. Denies nausea, vomiting, or diaphoresis. Pt states that he is an everyday smoker.   Past Medical History  Diagnosis Date  . Depression   . Hypomagnesemia   . Fall at nursing home 05/27/2013    "slipped on water; said I broke my right hip" (05/27/2013)  . Migraine     "used to have them bad; haven't had one in awhile" (05/27/2013)  . Gout attack     "not too long ago; in my right foot" (05/27/2013)  . Complete heart block     reason for pacemaker/notes 05/27/2013  . Atrial fibrillation     [name] 05/27/2013  . HLD (hyperlipidemia)   . GIB (gastrointestinal bleeding)   . BPH (benign prostatic hyperplasia)   . Presence of permanent cardiac pacemaker    Past Surgical History  Procedure Laterality Date  . Insert / replace / remove pacemaker    . Appendectomy    . Cholecystectomy    . Open reduction of hip Right 05/28/2013    Procedure: OPEN REDUCTION OF HIP;  Surgeon: Renette Butters, MD;  Location: [place];  Service: Orthopedics;  Laterality: Right;  . Esophagogastroduodenoscopy (egd) with propofol N/A 09/26/2014    Procedure: ESOPHAGOGASTRODUODENOSCOPY (EGD) WITH PROPOFOL;   Surgeon: Jerene Bears, MD;  Location: [facility] ENDOSCOPY;  Service: Endoscopy;  Laterality: N/A;  . Colonoscopy N/A 10/01/2014    Procedure: COLONOSCOPY;  Surgeon: Jerene Bears, MD;  Location: [facility] ENDOSCOPY;  Service: Endoscopy;  Laterality: N/A;  . Partial colectomy N/A 10/05/2014    Procedure: SUBTOTAL COLECTOMY WITH ILEORECTAL ANASTOMOSIS;  Surgeon: Donnie Mesa, MD;  Location: [place];  Service: General;  Laterality: N/A;  . Flexible sigmoidoscopy N/A 11/07/2014    Procedure: FLEXIBLE SIGMOIDOSCOPY;  Surgeon: Jerene Bears, MD;  Location: [facility] ENDOSCOPY;  Service: Endoscopy;  Laterality: N/A;   Family History  Problem Relation Age of Onset  . Colon cancer Mother    History  Substance Use Topics  . Smoking status: Current Some Day Smoker -- 0.50 packs/day for 66 years    Types: Cigarettes  . Smokeless tobacco: Former Systems developer    Types: Snuff, Chew     Comment: 05/27/2013 "haven't used chew or snuff in ~ 30 yr"  . Alcohol Use: No     Comment: 05/27/2013 "none in years; never had problem w/it" - not in 30 years    Review of Systems  Constitutional: Negative for diaphoresis.  Respiratory: Positive for shortness of breath.   Cardiovascular: Positive for chest pain.  Gastrointestinal: Negative for nausea and vomiting.  All other systems reviewed and are negative.  Allergies  Review of patient's allergies indicates no known allergies.  Home Medications   Prior to Admission medications   Medication Sig Start Date End Date Taking? Authorizing Provider  Calcium Carbonate-Vit D-Min (CALTRATE 600+D PLUS MINERALS) 600-800 MG-UNIT TABS Take 1 tablet by mouth daily.    Historical Provider, MD  carvedilol (COREG) 6.25 MG tablet Take 1 tablet (6.25 mg total) by mouth 2 (two) times daily with a meal. 11/16/14   Velvet Bathe, MD  ciprofloxacin (CIPRO) 500 MG tablet Take 1 tablet (500 mg total) by mouth 2 (two) times daily. One po bid x 7 days Patient taking differently: Take 500 mg by mouth 2 (two) times  daily. Give 2 hours before colchicine 11/29/14   Veryl Speak, MD  colchicine 0.6 MG tablet Take 1.2 mg by mouth once.    Historical Provider, MD  finasteride (PROSCAR) 5 MG tablet Take 5 mg by mouth daily.    Historical Provider, MD  HYDROcodone-acetaminophen (NORCO/VICODIN) 5-325 MG per tablet Take 1 tablet by mouth 2 (two) times daily as needed for severe pain. Patient not taking: Reported on 12/05/2014 12/05/14   Everlene Balls, MD  loperamide (IMODIUM A-D) 2 MG tablet Take 2 mg by mouth 4 (four) times daily as needed for diarrhea or loose stools.    Historical Provider, MD  magnesium oxide (MAG-OX) 400 MG tablet Take 400 mg by mouth 2 (two) times daily.    Historical Provider, MD  metroNIDAZOLE (FLAGYL) 500 MG tablet Take 1 tablet (500 mg total) by mouth 3 (three) times daily. One po bid x 7 days 11/29/14   Veryl Speak, MD  polycarbophil (FIBERCON) 625 MG tablet Take 1 tablet (625 mg total) by mouth daily. Patient not taking: Reported on 11/29/2014 11/09/14   Modena Jansky, MD  pravastatin (PRAVACHOL) 10 MG tablet Take 10 mg by mouth at bedtime.    Historical Provider, MD  sertraline (ZOLOFT) 50 MG tablet Take 50 mg by mouth every morning.     Historical Provider, MD  traMADol (ULTRAM) 50 MG tablet Take 50 mg by mouth every 6 (six) hours as needed for moderate pain.    Historical Provider, MD  zolpidem (AMBIEN) 5 MG tablet Take 5 mg by mouth at bedtime.    Historical Provider, MD   BP 103/48 mmHg  Pulse 83  Temp(Src) 98.6 F (37 C) (Oral)  Resp 14  SpO2 97%   Physical Exam  Constitutional: He is oriented to person, place, and time. He appears well-developed. No distress.  Somewhat cachectic  HENT:  Head: Normocephalic and atraumatic.  Eyes: Conjunctivae and EOM are normal. Pupils are equal, round, and reactive to light.  Neck: Normal range of motion. Neck supple. No JVD present.  Cardiovascular: Normal rate and normal heart sounds.   No murmur heard. Irregularly irregular rhythm   Pulmonary/Chest: Effort normal and breath sounds normal. He has no wheezes. He has no rales. He exhibits no tenderness.  Pacemaker present in the right subclavian area  Abdominal: Soft. Bowel sounds are normal. He exhibits no distension and no mass. There is no tenderness.  Musculoskeletal: Normal range of motion. He exhibits no edema.  Lymphadenopathy:    He has no cervical adenopathy.  Neurological: He is alert and oriented to person, place, and time. No cranial nerve deficit. He exhibits normal muscle tone. Coordination normal.  Skin: Skin is warm and dry. No rash noted.  Psychiatric: He has a normal mood and affect. His behavior is normal. Judgment and thought content normal.  Nursing note and vitals reviewed.   ED Course  Procedures (including critical care time) DIAGNOSTIC STUDIES: Oxygen Saturation is 97% on RA, normal by my interpretation.    COORDINATION OF CARE: 12:02 AM-Discussed treatment plan with pt at bedside and pt agreed to plan.     Labs Review Results for orders placed or performed during the hospital encounter of 12/06/14  CBC  Result Value Ref Range   WBC 6.4 4.0 - 10.5 K/uL   RBC 3.15 (L) 4.22 - 5.81 MIL/uL   Hemoglobin 9.6 (L) 13.0 - 17.0 g/dL   HCT 30.3 (L) 39.0 - 52.0 %   MCV 96.2 78.0 - 100.0 fL   MCH 30.5 26.0 - 34.0 pg   MCHC 31.7 30.0 - 36.0 g/dL   RDW 17.0 (H) 11.5 - 15.5 %   Platelets 156 150 - 400 K/uL  Basic metabolic panel  Result Value Ref Range   Sodium 137 135 - 145 mmol/L   Potassium 3.6 3.5 - 5.1 mmol/L   Chloride 108 101 - 111 mmol/L   CO2 22 22 - 32 mmol/L   Glucose, Bld 109 (H) 65 - 99 mg/dL   BUN 27 (H) 6 - 20 mg/dL   Creatinine, Ser 0.65 0.61 - 1.24 mg/dL   Calcium 8.4 (L) 8.9 - 10.3 mg/dL   GFR calc non Af Amer >60 >60 mL/min   GFR calc Af Amer >60 >60 mL/min   Anion gap 7 5 - 15  I-stat troponin, ED  (not at [facility])  Result Value Ref Range   Troponin i, poc 0.00 0.00 - 0.08 ng/mL   Comment 3            Imaging  Review Ct Biopsy  12/05/2014   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  EXAM: CT GUIDED DEEP ILIAC BONE ASPIRATION AND CORE BIOPSY  TECHNIQUE: Patient was placed supine on the CT gantry and limited axial scans through the pelvis were obtained. Appropriate skin entry site was identified. Skin site was marked, prepped with Betadine, draped in usual sterile fashion, and infiltrated locally with 1% lidocaine.  Intravenous Fentanyl and Versed were administered as conscious sedation during continuous cardiorespiratory monitoring by the radiology RN, with a total moderate sedation time of 3 minutes.  Under CT fluoroscopic guidance an 11-gauge Cook trocar bone needle was advanced into the right iliac bone just lateral to the sacroiliac joint. Once needle tip position was confirmed, coaxial core and aspiration samples were obtained. The final sample was obtained using the guiding needle itself, which was then removed. Post procedure scans show no hematoma or fracture. Patient tolerated procedure well.  COMPLICATIONS: COMPLICATIONS none  IMPRESSION: 1. Technically successful CT guided right iliac bone core and aspiration biopsy.   Electronically Signed   By: Lucrezia Europe M.D.   On: 12/05/2014 12:56   Dg Chest Port 1 View  12/06/2014   CLINICAL DATA:  69 year old male with a history of chest pain. Wheezing  EXAM: PORTABLE CHEST - 1 VIEW  COMPARISON:  12/01/2014, 11/25/2014, chest CT 11/23/2014  FINDINGS: Cardiomediastinal silhouette unchanged in size and contour. No evidence of pulmonary vascular congestion.  Cardiac pacing device on the right chest wall is unchanged with 2 leads in place.  Atherosclerotic calcification of the aortic arch.  No confluent airspace disease, pneumothorax, or pleural effusion.  Hyperinflation on the anterior view is again evident.  IMPRESSION: No radiographic evidence of acute cardiopulmonary disease, with changes compatible with emphysema.  Unchanged position of cardiac  pacing  device on the right chest wall.  Signed,  Dulcy Fanny. Earleen Newport, DO  [facility]  [facility]   Electronically Signed   By: Corrie Mckusick D.O.   On: 12/06/2014 22:49     EKG Interpretation   Date/Time:  Tuesday December 06 2014 22:16:11 EDT Ventricular Rate:  78 PR Interval:    QRS Duration: 104 QT Interval:  404 QTC Calculation: 460 R Axis:   64 Text Interpretation:  Demand pacemaker; interpretation is based on  intrinsic rhythm Atrial flutter with variable A-V block with premature  ventricular or aberrantly conducted complexes Abnormal ECG When compared  with ECG of 12/04/2014, No significant change was found Confirmed by [facility] ([facility])   MD, Nana Vastine ([id number]) on 12/07/2014 12:00:34 AM      MDM   Final diagnoses:  Atypical chest pain  Chronic atrial fibrillation  Normocytic anemia      Chest pain which has resolved. Old records reviewed and he has multiple ED visits for chest pain and recent hospitalization with cardiology consultation. No evidence of coronary artery disease. ECG is unchanged and troponin is normal. He is discharged and instructed to follow-up with his PCP.   I personally performed the services described in this documentation, which was scribed in my presence. The recorded information has been reviewed and is accurate.       [name], MD 23/95/32 0233

## 2014-12-06 NOTE — ED Notes (Signed)
Pt arrives via EMS with chest pain, per EMS appears chronic, pt states this episode ongoing for two hours. Mild wheezing, EMS started 5MG  albuterol TX, LS clear now. Pt has on demand pacemaker, aflutter at this time. EMS gave 324 MG ASA, 1 SL nitro and 125 MG solumedrol.   PT FROM [facility], [facility] AT BEDSIDE.

## 2014-12-07 NOTE — Discharge Instructions (Signed)

## 2014-12-07 NOTE — ED Notes (Signed)
Dr. Glick at bedside.  

## 2014-12-07 NOTE — ED Notes (Signed)
Pt transferred to [place]

## 2014-12-08 ENCOUNTER — Encounter: Payer: Medicare Other | Admitting: Hematology

## 2014-12-08 NOTE — Progress Notes (Signed)
No show  This encounter was created in error - please disregard.

## 2014-12-09 ENCOUNTER — Encounter (HOSPITAL_COMMUNITY): Payer: Self-pay | Admitting: Emergency Medicine

## 2014-12-09 ENCOUNTER — Telehealth: Payer: Self-pay | Admitting: Hematology

## 2014-12-09 ENCOUNTER — Emergency Department (HOSPITAL_COMMUNITY)
Admission: EM | Admit: 2014-12-09 | Discharge: 2014-12-10 | Disposition: A | Discharge status: Home / Self Care | Payer: Medicare Other | Attending: Emergency Medicine | Admitting: Emergency Medicine

## 2014-12-09 DIAGNOSIS — Z9181 History of falling: Secondary | ICD-10-CM | POA: Insufficient documentation

## 2014-12-09 DIAGNOSIS — R079 Chest pain, unspecified: Secondary | ICD-10-CM | POA: Diagnosis present

## 2014-12-09 DIAGNOSIS — Z720 Tobacco use: Secondary | ICD-10-CM | POA: Insufficient documentation

## 2014-12-09 DIAGNOSIS — D649 Anemia, unspecified: Secondary | ICD-10-CM | POA: Insufficient documentation

## 2014-12-09 DIAGNOSIS — Z87438 Personal history of other diseases of male genital organs: Secondary | ICD-10-CM | POA: Insufficient documentation

## 2014-12-09 DIAGNOSIS — I10 Essential (primary) hypertension: Secondary | ICD-10-CM | POA: Insufficient documentation

## 2014-12-09 DIAGNOSIS — K625 Hemorrhage of anus and rectum: Secondary | ICD-10-CM | POA: Insufficient documentation

## 2014-12-09 DIAGNOSIS — R0602 Shortness of breath: Secondary | ICD-10-CM | POA: Insufficient documentation

## 2014-12-09 DIAGNOSIS — F329 Major depressive disorder, single episode, unspecified: Secondary | ICD-10-CM | POA: Insufficient documentation

## 2014-12-09 DIAGNOSIS — E785 Hyperlipidemia, unspecified: Secondary | ICD-10-CM | POA: Insufficient documentation

## 2014-12-09 DIAGNOSIS — Z79899 Other long term (current) drug therapy: Secondary | ICD-10-CM | POA: Diagnosis not present

## 2014-12-09 DIAGNOSIS — Z950 Presence of cardiac pacemaker: Secondary | ICD-10-CM | POA: Diagnosis not present

## 2014-12-09 HISTORY — DX: Essential (primary) hypertension: I10

## 2014-12-09 NOTE — Telephone Encounter (Signed)
per pof to sch pt appt-cld & spoke to [employer] and the rehab center where pt resides and adv of r/s appt time & date-stated she would write it down

## 2014-12-09 NOTE — ED Notes (Signed)
Per EMS pt was picked up from [facility] w/ c/o chest pain and rectal bleeding.  He informed EMS that he "needed his pacemaker to be "reset" b/c it was hurting."  The staff at [facility] informed EMS that he could not return.  Vitals stable upon transport.

## 2014-12-10 ENCOUNTER — Emergency Department (HOSPITAL_COMMUNITY)
Admission: EM | Admit: 2014-12-10 | Discharge: 2014-12-10 | Disposition: A | Discharge status: Home / Self Care | Payer: Medicare Other | Attending: Emergency Medicine | Admitting: Emergency Medicine

## 2014-12-10 ENCOUNTER — Emergency Department (HOSPITAL_COMMUNITY): Payer: Medicare Other

## 2014-12-10 ENCOUNTER — Encounter (HOSPITAL_COMMUNITY): Payer: Self-pay

## 2014-12-10 DIAGNOSIS — Z950 Presence of cardiac pacemaker: Secondary | ICD-10-CM | POA: Diagnosis not present

## 2014-12-10 DIAGNOSIS — Z87438 Personal history of other diseases of male genital organs: Secondary | ICD-10-CM | POA: Insufficient documentation

## 2014-12-10 DIAGNOSIS — Z79899 Other long term (current) drug therapy: Secondary | ICD-10-CM | POA: Diagnosis not present

## 2014-12-10 DIAGNOSIS — Z720 Tobacco use: Secondary | ICD-10-CM | POA: Diagnosis not present

## 2014-12-10 DIAGNOSIS — F329 Major depressive disorder, single episode, unspecified: Secondary | ICD-10-CM | POA: Diagnosis not present

## 2014-12-10 DIAGNOSIS — Z8674 Personal history of sudden cardiac arrest: Secondary | ICD-10-CM | POA: Insufficient documentation

## 2014-12-10 DIAGNOSIS — I10 Essential (primary) hypertension: Secondary | ICD-10-CM | POA: Diagnosis not present

## 2014-12-10 DIAGNOSIS — D649 Anemia, unspecified: Secondary | ICD-10-CM | POA: Diagnosis not present

## 2014-12-10 DIAGNOSIS — Z8739 Personal history of other diseases of the musculoskeletal system and connective tissue: Secondary | ICD-10-CM | POA: Insufficient documentation

## 2014-12-10 DIAGNOSIS — K625 Hemorrhage of anus and rectum: Secondary | ICD-10-CM | POA: Diagnosis present

## 2014-12-10 DIAGNOSIS — Z9181 History of falling: Secondary | ICD-10-CM | POA: Insufficient documentation

## 2014-12-10 LAB — BASIC METABOLIC PANEL
Anion gap: 9 (ref 5–15)
BUN: 21 mg/dL — ABNORMAL HIGH (ref 6–20)
CO2: 20 mmol/L — ABNORMAL LOW (ref 22–32)
Calcium: 8.7 mg/dL — ABNORMAL LOW (ref 8.9–10.3)
Chloride: 109 mmol/L (ref 101–111)
Creatinine, Ser: 0.66 mg/dL (ref 0.61–1.24)
GFR calc non Af Amer: >60 mL/min (ref >60)
Glucose, Bld: 107 mg/dL — ABNORMAL HIGH (ref 65–99)
POTASSIUM: 3.7 mmol/L (ref 3.5–5.1)
SODIUM: 138 mmol/L (ref 135–145)

## 2014-12-10 LAB — CBC
HCT: 36.1 % — ABNORMAL LOW (ref 39.0–52.0)
HEMOGLOBIN: 11.3 g/dL — AB (ref 13.0–17.0)
MCH: 30.4 pg (ref 26.0–34.0)
MCHC: 31.3 g/dL (ref 30.0–36.0)
MCV: 97 fL (ref 78.0–100.0)
PLATELETS: 172 10*3/uL (ref 150–400)
RBC: 3.72 MIL/uL — AB (ref 4.22–5.81)
RDW: 17.6 % — AB (ref 11.5–15.5)
WBC: 7.2 10*3/uL (ref 4.0–10.5)

## 2014-12-10 LAB — I-STAT TROPONIN, ED: Troponin i, poc: 0 ng/mL (ref 0.00–0.08)

## 2014-12-10 LAB — PROTIME-INR
INR: 1.14 (ref 0.00–1.49)
Prothrombin Time: 14.8 seconds (ref 11.6–15.2)

## 2014-12-10 LAB — HEMOGLOBIN AND HEMATOCRIT, BLOOD
HCT: 34.7 % — ABNORMAL LOW (ref 39.0–52.0)
Hemoglobin: 10.9 g/dL — ABNORMAL LOW (ref 13.0–17.0)

## 2014-12-10 LAB — I-STAT CG4 LACTIC ACID, ED: Lactic Acid, Venous: 0.94 mmol/L (ref 0.5–2.0)

## 2014-12-10 LAB — POC OCCULT BLOOD, ED: FECAL OCCULT BLD: NEGATIVE

## 2014-12-10 NOTE — Discharge Instructions (Signed)
You did not show any sign of bleeding today.

## 2014-12-10 NOTE — ED Notes (Signed)
Pt agitated and wandering in hallway; attempted to redirect patient to room by multiple staff members. Pt unwilling to be redirected. Security with patient; MD notified

## 2014-12-10 NOTE — ED Notes (Signed)
Safety sitter is bedside for pt safety due to wondering behavior.

## 2014-12-10 NOTE — ED Notes (Signed)
Per EMS - pt was seen earlier today at [facility] for rectal bleeding. Returns with same complaint. Ambulatory to room. VSS.

## 2014-12-10 NOTE — ED Notes (Signed)
RN just redirected pt back to his room.  He does not want to remain in his room but is unable to verbalize where he is going.

## 2014-12-10 NOTE — ED Notes (Signed)
[facility] has been called. No ETA given.

## 2014-12-10 NOTE — ED Provider Notes (Signed)
CSN: [id number]     Arrival date & time 12/09/14  2328 History  This chart was scribed for Damon Fuel, MD by [name], ED Scribe. This patient was seen in room B17C/B17C and the patient's care was started at 12:29 AM.   Chief Complaint  Patient presents with  . Chest Pain  . Rectal Bleeding   Patient is a 69 y.o. male presenting with chest pain and hematochezia. The history is provided by the patient. No language interpreter was used.  Chest Pain Associated symptoms: abdominal pain and shortness of breath   Associated symptoms: no dizziness   Rectal Bleeding Associated symptoms: abdominal pain   Associated symptoms: no dizziness    HPI Comments: [name] is a 69 y.o. male who presents to the Emergency Department complaining of moderate dark red rectal bleeding that began yesterday. He denies any excretion of blood clots. He also reports associated mid abdomen tenderness, SOB and chest pain. He denies associated dizziness. Pt was seen 2 days ago for chest pain.  Past Medical History  Diagnosis Date  . Depression   . Hypomagnesemia   . Fall at nursing home 05/27/2013    "slipped on water; said I broke my right hip" (05/27/2013)  . Migraine     "used to have them bad; haven't had one in awhile" (05/27/2013)  . Gout attack     "not too long ago; in my right foot" (05/27/2013)  . Complete heart block     reason for pacemaker/notes 05/27/2013  . Atrial fibrillation     [name] 05/27/2013  . HLD (hyperlipidemia)   . GIB (gastrointestinal bleeding)   . BPH (benign prostatic hyperplasia)   . Presence of permanent cardiac pacemaker   . Hypertension    Past Surgical History  Procedure Laterality Date  . Insert / replace / remove pacemaker    . Appendectomy    . Cholecystectomy    . Open reduction of hip Right 05/28/2013    Procedure: OPEN REDUCTION OF HIP;  Surgeon: Renette Butters, MD;  Location: [place];  Service: Orthopedics;  Laterality: Right;  .  Esophagogastroduodenoscopy (egd) with propofol N/A 09/26/2014    Procedure: ESOPHAGOGASTRODUODENOSCOPY (EGD) WITH PROPOFOL;  Surgeon: Jerene Bears, MD;  Location: [facility] ENDOSCOPY;  Service: Endoscopy;  Laterality: N/A;  . Colonoscopy N/A 10/01/2014    Procedure: COLONOSCOPY;  Surgeon: Jerene Bears, MD;  Location: [facility] ENDOSCOPY;  Service: Endoscopy;  Laterality: N/A;  . Partial colectomy N/A 10/05/2014    Procedure: SUBTOTAL COLECTOMY WITH ILEORECTAL ANASTOMOSIS;  Surgeon: Donnie Mesa, MD;  Location: [place];  Service: General;  Laterality: N/A;  . Flexible sigmoidoscopy N/A 11/07/2014    Procedure: FLEXIBLE SIGMOIDOSCOPY;  Surgeon: Jerene Bears, MD;  Location: [facility] ENDOSCOPY;  Service: Endoscopy;  Laterality: N/A;   Family History  Problem Relation Age of Onset  . Colon cancer Mother    History  Substance Use Topics  . Smoking status: Current Some Day Smoker -- 0.50 packs/day for 66 years    Types: Cigarettes  . Smokeless tobacco: Former Systems developer    Types: Snuff, Chew     Comment: 05/27/2013 "haven't used chew or snuff in ~ 30 yr"  . Alcohol Use: No     Comment: 05/27/2013 "none in years; never had problem w/it" - not in 30 years   Review of Systems  Respiratory: Positive for shortness of breath.   Cardiovascular: Positive for chest pain.  Gastrointestinal: Positive for abdominal pain, hematochezia and anal bleeding.  Neurological: Negative for dizziness.  All other systems reviewed and are negative.  Allergies  Review of patient's allergies indicates no known allergies.  Home Medications   Prior to Admission medications   Medication Sig Start Date End Date Taking? Authorizing Provider  acetaminophen (TYLENOL) 325 MG tablet Take 650 mg by mouth every 6 (six) hours as needed for mild pain.    Historical Provider, MD  Calcium Carbonate-Vit D-Min (CALTRATE 600+D PLUS MINERALS) 600-800 MG-UNIT TABS Take 1 tablet by mouth daily.    Historical Provider, MD  carvedilol (COREG) 6.25 MG tablet Take 1  tablet (6.25 mg total) by mouth 2 (two) times daily with a meal. 11/16/14   Velvet Bathe, MD  ciprofloxacin (CIPRO) 500 MG tablet Take 1 tablet (500 mg total) by mouth 2 (two) times daily. One po bid x 7 days Patient taking differently: Take 500 mg by mouth 2 (two) times daily. Give 2 hours before colchicine 11/29/14   Veryl Speak, MD  finasteride (PROSCAR) 5 MG tablet Take 5 mg by mouth daily.    Historical Provider, MD  HYDROcodone-acetaminophen (NORCO/VICODIN) 5-325 MG per tablet Take 1 tablet by mouth 2 (two) times daily as needed for severe pain. Patient not taking: Reported on 12/05/2014 12/05/14   Everlene Balls, MD  loperamide (IMODIUM A-D) 2 MG tablet Take 2 mg by mouth 4 (four) times daily as needed for diarrhea or loose stools.    Historical Provider, MD  magnesium oxide (MAG-OX) 400 MG tablet Take 400 mg by mouth 2 (two) times daily.    Historical Provider, MD  metroNIDAZOLE (FLAGYL) 500 MG tablet Take 1 tablet (500 mg total) by mouth 3 (three) times daily. One po bid x 7 days 11/29/14   Veryl Speak, MD  pravastatin (PRAVACHOL) 10 MG tablet Take 10 mg by mouth at bedtime.    Historical Provider, MD  sertraline (ZOLOFT) 50 MG tablet Take 50 mg by mouth every morning.     Historical Provider, MD  traMADol (ULTRAM) 50 MG tablet Take 50 mg by mouth every 6 (six) hours as needed for moderate pain.    Historical Provider, MD  zolpidem (AMBIEN) 5 MG tablet Take 5 mg by mouth at bedtime.    Historical Provider, MD   Triage Vitals: BP 159/106 mmHg  Pulse 101  Temp(Src) 98.3 F (36.8 C) (Oral)  Resp 19  Ht 5\' 9"  (1.753 m)  Wt 86 lb (39.009 kg)  BMI 12.69 kg/m2  SpO2 99%  Physical Exam  Constitutional: He is oriented to person, place, and time. He appears well-developed and well-nourished. No distress.  HENT:  Head: Normocephalic and atraumatic.  Eyes: Conjunctivae and EOM are normal. Pupils are equal, round, and reactive to light.  No conjunctival pallor  Neck: Normal range of motion. Neck  supple. No JVD present.  Cardiovascular: Normal rate.   Pulmonary/Chest: Effort normal and breath sounds normal. He has no wheezes. He has no rales. He exhibits no tenderness.  Abdominal: Soft. Bowel sounds are normal. He exhibits no mass. There is tenderness. There is no rebound and no guarding.  Mild periumbilical tenderness.  Genitourinary:  Normal sphincter tone. Small amount of liquid stool which is brown and has been sent for heme occult testing.  Musculoskeletal: Normal range of motion. He exhibits no edema.  Lymphadenopathy:    He has no cervical adenopathy.  Neurological: He is alert and oriented to person, place, and time. No cranial nerve deficit. He exhibits normal muscle tone. Coordination normal.  Skin: Skin is warm  and dry. No rash noted.  Psychiatric:  Flat affect  Nursing note and vitals reviewed.  ED Course  Procedures (including critical care time)  DIAGNOSTIC STUDIES: Oxygen Saturation is 99% on RA, normal by my interpretation.    COORDINATION OF CARE: 12:31 AM- Ordered diagnostic CXR, and lab work. Pt advised of plan for treatment and pt agrees.  Labs Review Results for orders placed or performed during the hospital encounter of 12/09/14  CBC  Result Value Ref Range   WBC 7.2 4.0 - 10.5 K/uL   RBC 3.72 (L) 4.22 - 5.81 MIL/uL   Hemoglobin 11.3 (L) 13.0 - 17.0 g/dL   HCT 36.1 (L) 39.0 - 52.0 %   MCV 97.0 78.0 - 100.0 fL   MCH 30.4 26.0 - 34.0 pg   MCHC 31.3 30.0 - 36.0 g/dL   RDW 17.6 (H) 11.5 - 15.5 %   Platelets 172 150 - 400 K/uL  Basic metabolic panel  Result Value Ref Range   Sodium 138 135 - 145 mmol/L   Potassium 3.7 3.5 - 5.1 mmol/L   Chloride 109 101 - 111 mmol/L   CO2 20 (L) 22 - 32 mmol/L   Glucose, Bld 107 (H) 65 - 99 mg/dL   BUN 21 (H) 6 - 20 mg/dL   Creatinine, Ser 0.66 0.61 - 1.24 mg/dL   Calcium 8.7 (L) 8.9 - 10.3 mg/dL   GFR calc non Af Amer >60 >60 mL/min   GFR calc Af Amer >60 >60 mL/min   Anion gap 9 5 - 15  Protime-INR (if pt  is taking Coumadin)  Result Value Ref Range   Prothrombin Time 14.8 11.6 - 15.2 seconds   INR 1.14 0.00 - 1.49  I-stat troponin, ED  (not at [facility])  Result Value Ref Range   Troponin i, poc 0.00 0.00 - 0.08 ng/mL   Comment 3          I-Stat CG4 Lactic Acid, ED  Result Value Ref Range   Lactic Acid, Venous 0.94 0.5 - 2.0 mmol/L  POC occult blood, ED Provider will collect  Result Value Ref Range   Fecal Occult Bld NEGATIVE NEGATIVE   Imaging Review Dg Chest 2 View  12/10/2014   CLINICAL DATA:  Chest pain and dyspnea for most of the day today  EXAM: CHEST  2 VIEW  COMPARISON:  12/06/2014  FINDINGS: There is marked hyperinflation. Nodular-appearing opacity in the right apex represents a benign focus of sclerosis in the right posterior fourth rib, seen on the 11/23/2014 CT, combined with monitor lead on the skin. The lungs are clear. The pulmonary vasculature is normal. Hilar, mediastinal and cardiac contours are unremarkable and unchanged. There is no pneumothorax. There is no large effusion.  There are grossly intact appearances of the transvenous leads.  IMPRESSION: Hyperinflation.  No acute findings.   Electronically Signed   By: Andreas Newport M.D.   On: 12/10/2014 00:18   MDM   Final diagnoses:  Normocytic anemia    Report of rectal bleeding. However, hemoglobin has actually risen from the last recorded value which was only 2 days ago, and there is no gross blood on rectal exam. Heme occult testing is pending. Will check orthostatic vital signs and lactic acid. If negative, will return to his nursing facility.  Heme occult is negative. Orthostatic vital signs are unremarkable. Lactic acid is normal. Patient is discharged. At about the time that the results were coming back, patient stated that he did not want to  stay and was caving. He actually left before discharge instructions could be given but he would have been discharged anyway as there were no acute findings.  I personally  performed the services described in this documentation, which was scribed in my presence. The recorded information has been reviewed and is accurate.     Damon Fuel, MD 50/72/25 [id number]

## 2014-12-10 NOTE — ED Notes (Signed)
Pt found wondering in the hallway; had unhooked himself and climbed out of bed. When asked where he was heading, pt stated he was going outside. Informed patient to wait for MD in room. Pt assisted to room without difficulty. Pt appears anxious at this time. Pt taken to xray

## 2014-12-10 NOTE — ED Notes (Signed)
Pt returned from X-ray.  When RN entered the room she found that he had the wall clock in the bed w/ him and was attempting to hide it by lying on top of it.  He was unable to explain what he was doing.

## 2014-12-10 NOTE — ED Notes (Signed)
Pt reporting chest pain . Rectal pain and abd pain.

## 2014-12-10 NOTE — ED Notes (Signed)
MD at bedside. 

## 2014-12-10 NOTE — Care Management (Signed)
ED CM consulted by Dr. Roxanne Mins in regards to disposition, CM reviewed record, and met with patient at bedside, he states he resides at [facility],  spoke with Donald Prose [phone] she was informed that patient will be returning  to the facility she was agreeable. [place] ED Charge Nurse made aware [facility] will be contacted for transport.

## 2014-12-10 NOTE — ED Notes (Signed)
Clock removed from room; pt continues to take clock off wall and "hide it" by laying on it

## 2014-12-10 NOTE — ED Notes (Signed)
Contacted [facility] [place] facility; per [place], caretaker, pt is no longer allowed at their facility. Informed staff that pt may return there.

## 2014-12-10 NOTE — ED Provider Notes (Addendum)
CSN: [id number]     Arrival date & time 12/10/14  0542 History   First MD Initiated Contact with Patient 12/10/14 [phone]     Chief Complaint  Patient presents with  . Rectal Bleeding     (Consider location/radiation/quality/duration/timing/severity/associated sxs/prior Treatment) Patient is a 69 y.o. male presenting with hematochezia. The history is provided by the patient.  Rectal Bleeding He had been in their emergency department earlier this evening and have been complaining of rectal bleeding. He was found to have Hemoccult-negative stool and stable hemoglobin. He left prior to receiving his discharge instructions and was found walking along the road and return to the emergency department continuing to claim that he is having rectal bleeding.  Past Medical History  Diagnosis Date  . Depression   . Hypomagnesemia   . Fall at nursing home 05/27/2013    "slipped on water; said I broke my right hip" (05/27/2013)  . Migraine     "used to have them bad; haven't had one in awhile" (05/27/2013)  . Gout attack     "not too long ago; in my right foot" (05/27/2013)  . Complete heart block     reason for pacemaker/notes 05/27/2013  . Atrial fibrillation     [name] 05/27/2013  . HLD (hyperlipidemia)   . GIB (gastrointestinal bleeding)   . BPH (benign prostatic hyperplasia)   . Presence of permanent cardiac pacemaker   . Hypertension    Past Surgical History  Procedure Laterality Date  . Insert / replace / remove pacemaker    . Appendectomy    . Cholecystectomy    . Open reduction of hip Right 05/28/2013    Procedure: OPEN REDUCTION OF HIP;  Surgeon: Renette Butters, MD;  Location: [place];  Service: Orthopedics;  Laterality: Right;  . Esophagogastroduodenoscopy (egd) with propofol N/A 09/26/2014    Procedure: ESOPHAGOGASTRODUODENOSCOPY (EGD) WITH PROPOFOL;  Surgeon: Jerene Bears, MD;  Location: [facility] ENDOSCOPY;  Service: Endoscopy;  Laterality: N/A;  . Colonoscopy N/A 10/01/2014   Procedure: COLONOSCOPY;  Surgeon: Jerene Bears, MD;  Location[facility] ENDOSCOPY;  Service: Endoscopy;  Laterality: N/A;  . Partial colectomy N/A 10/05/2014    Procedure: SUBTOTAL COLECTOMY WITH ILEORECTAL ANASTOMOSIS;  Surgeon: Donnie Mesa, MD;  Location: [place];  Service: General;  Laterality: N/A;  . Flexible sigmoidoscopy N/A 11/07/2014    Procedure: FLEXIBLE SIGMOIDOSCOPY;  Surgeon: Jerene Bears, MD;  Location: [facility] ENDOSCOPY;  Service: Endoscopy;  Laterality: N/A;   Family History  Problem Relation Age of Onset  . Colon cancer Mother    History  Substance Use Topics  . Smoking status: Current Some Day Smoker -- 0.50 packs/day for 66 years    Types: Cigarettes  . Smokeless tobacco: Former Systems developer    Types: Snuff, Chew     Comment: 05/27/2013 "haven't used chew or snuff in ~ 30 yr"  . Alcohol Use: No     Comment: 05/27/2013 "none in years; never had problem w/it" - not in 30 years    Review of Systems  Gastrointestinal: Positive for hematochezia.  All other systems reviewed and are negative.     Allergies  Review of patient's allergies indicates no known allergies.  Home Medications   Prior to Admission medications   Medication Sig Start Date End Date Taking? Authorizing Provider  Calcium Carbonate-Vit D-Min (CALTRATE 600+D PLUS MINERALS) 600-800 MG-UNIT TABS Take 1 tablet by mouth daily.   Yes Historical Provider, MD  carvedilol (COREG) 6.25 MG tablet Take 1 tablet (6.25 mg  total) by mouth 2 (two) times daily with a meal. 11/16/14  Yes Velvet Bathe, MD  finasteride (PROSCAR) 5 MG tablet Take 5 mg by mouth daily.   Yes Historical Provider, MD  HYDROcodone-acetaminophen (NORCO/VICODIN) 5-325 MG per tablet Take 1 tablet by mouth 2 (two) times daily as needed for severe pain. 12/05/14  Yes Everlene Balls, MD  loperamide (IMODIUM A-D) 2 MG tablet Take 2 mg by mouth 4 (four) times daily as needed for diarrhea or loose stools.   Yes Historical Provider, MD  magnesium oxide (MAG-OX) 400 MG tablet  Take 400 mg by mouth 2 (two) times daily.   Yes Historical Provider, MD  pravastatin (PRAVACHOL) 10 MG tablet Take 10 mg by mouth at bedtime.   Yes Historical Provider, MD  sertraline (ZOLOFT) 50 MG tablet Take 50 mg by mouth every morning.    Yes Historical Provider, MD  traMADol (ULTRAM) 50 MG tablet Take 50 mg by mouth every 6 (six) hours as needed for moderate pain.   Yes Historical Provider, MD  ciprofloxacin (CIPRO) 500 MG tablet Take 1 tablet (500 mg total) by mouth 2 (two) times daily. One po bid x 7 days Patient not taking: Reported on 12/10/2014 11/29/14   Veryl Speak, MD  metroNIDAZOLE (FLAGYL) 500 MG tablet Take 1 tablet (500 mg total) by mouth 3 (three) times daily. One po bid x 7 days Patient not taking: Reported on 12/10/2014 11/29/14   Veryl Speak, MD   BP 120/52 mmHg  Pulse 97  Temp(Src) 97.9 F (36.6 C) (Oral)  Resp 21  Ht 5\' 9"  (1.753 m)  Wt 86 lb (39.009 kg)  BMI 12.69 kg/m2  SpO2 96% Physical Exam  Nursing note and vitals reviewed.  69 year old male, resting comfortably and in no acute distress. Vital signs are normal. Oxygen saturation is 96%, which is normal. Head is normocephalic and atraumatic. PERRLA, EOMI. Oropharynx is clear. Neck is nontender and supple without adenopathy or JVD. Back is nontender and there is no CVA tenderness. Lungs are clear without rales, wheezes, or rhonchi. Chest is nontender. Heart has regular rate and rhythm without murmur. Abdomen is soft, flat, nontender without masses or hepatosplenomegaly and peristalsis is normoactive. Extremities have no cyanosis or edema, full range of motion is present. Skin is warm and dry without rash. Neurologic: Mental status is normal, cranial nerves are intact, there are no motor or sensory deficits.  ED Course  Procedures (including critical care time) Labs Review Labs Reviewed  HEMOGLOBIN AND HEMATOCRIT, BLOOD - Abnormal; Notable for the following:    Hemoglobin 10.9 (*)    HCT 34.7 (*)    All  other components within normal limits    MDM   Final diagnoses:  Normochromic anemia    Alleged history of rectal bleeding without any evidence of actual bleeding. Hemoglobin is unchanged from last visit. There was a message from the facility where he stays that they were unable to accept him back. Patient will be kept in the ED for social service consultation to it attempt appropriate placement.    [name], MD 33/83/29 [id number]  Jacinto Keil, MD 60/60/04 [id number]

## 2014-12-10 NOTE — ED Notes (Signed)
Patient currently walking in department demanding to go to lobby to "lay on the sofa". Not redirectable at this time. Spoke with MD Roxanne Mins about patient. At this time MD advises that patient presents with no symptoms that would warrant restraint or detention of patient. States "If patient wishes to leave he can do so AMA".

## 2014-12-10 NOTE — ED Notes (Signed)
Pt demanded to leave, would not sign AMA paperwork.   Did

## 2014-12-11 ENCOUNTER — Emergency Department (HOSPITAL_COMMUNITY)
Admission: EM | Admit: 2014-12-11 | Discharge: 2014-12-12 | Disposition: A | Discharge status: Home / Self Care | Payer: Medicare Other | Attending: Emergency Medicine | Admitting: Emergency Medicine

## 2014-12-11 ENCOUNTER — Encounter (HOSPITAL_COMMUNITY): Payer: Self-pay

## 2014-12-11 DIAGNOSIS — K921 Melena: Secondary | ICD-10-CM | POA: Diagnosis present

## 2014-12-11 DIAGNOSIS — I4891 Unspecified atrial fibrillation: Secondary | ICD-10-CM | POA: Diagnosis not present

## 2014-12-11 DIAGNOSIS — Z8719 Personal history of other diseases of the digestive system: Secondary | ICD-10-CM | POA: Insufficient documentation

## 2014-12-11 DIAGNOSIS — Z79899 Other long term (current) drug therapy: Secondary | ICD-10-CM | POA: Diagnosis not present

## 2014-12-11 DIAGNOSIS — E785 Hyperlipidemia, unspecified: Secondary | ICD-10-CM | POA: Insufficient documentation

## 2014-12-11 DIAGNOSIS — Z950 Presence of cardiac pacemaker: Secondary | ICD-10-CM | POA: Insufficient documentation

## 2014-12-11 DIAGNOSIS — F329 Major depressive disorder, single episode, unspecified: Secondary | ICD-10-CM | POA: Insufficient documentation

## 2014-12-11 DIAGNOSIS — I10 Essential (primary) hypertension: Secondary | ICD-10-CM | POA: Insufficient documentation

## 2014-12-11 DIAGNOSIS — Z5900 Homelessness unspecified: Secondary | ICD-10-CM

## 2014-12-11 DIAGNOSIS — Z87438 Personal history of other diseases of male genital organs: Secondary | ICD-10-CM | POA: Diagnosis not present

## 2014-12-11 DIAGNOSIS — Z720 Tobacco use: Secondary | ICD-10-CM | POA: Insufficient documentation

## 2014-12-11 DIAGNOSIS — R195 Other fecal abnormalities: Secondary | ICD-10-CM | POA: Diagnosis not present

## 2014-12-11 NOTE — ED Notes (Signed)
Pt c/o dark red rectal bleeding x 44yrs with symptoms increasing over the last day.

## 2014-12-12 ENCOUNTER — Emergency Department (HOSPITAL_COMMUNITY)
Admission: EM | Admit: 2014-12-12 | Discharge: 2014-12-12 | Disposition: A | Discharge status: Home / Self Care | Payer: Medicare Other | Attending: Emergency Medicine | Admitting: Emergency Medicine

## 2014-12-12 ENCOUNTER — Encounter (HOSPITAL_COMMUNITY): Payer: Self-pay | Admitting: Emergency Medicine

## 2014-12-12 DIAGNOSIS — I10 Essential (primary) hypertension: Secondary | ICD-10-CM | POA: Diagnosis not present

## 2014-12-12 DIAGNOSIS — E785 Hyperlipidemia, unspecified: Secondary | ICD-10-CM | POA: Diagnosis not present

## 2014-12-12 DIAGNOSIS — F329 Major depressive disorder, single episode, unspecified: Secondary | ICD-10-CM | POA: Insufficient documentation

## 2014-12-12 DIAGNOSIS — Z8739 Personal history of other diseases of the musculoskeletal system and connective tissue: Secondary | ICD-10-CM | POA: Insufficient documentation

## 2014-12-12 DIAGNOSIS — Z720 Tobacco use: Secondary | ICD-10-CM | POA: Insufficient documentation

## 2014-12-12 DIAGNOSIS — I4891 Unspecified atrial fibrillation: Secondary | ICD-10-CM | POA: Diagnosis not present

## 2014-12-12 DIAGNOSIS — R195 Other fecal abnormalities: Secondary | ICD-10-CM | POA: Diagnosis not present

## 2014-12-12 DIAGNOSIS — Z79899 Other long term (current) drug therapy: Secondary | ICD-10-CM | POA: Insufficient documentation

## 2014-12-12 DIAGNOSIS — Z950 Presence of cardiac pacemaker: Secondary | ICD-10-CM | POA: Insufficient documentation

## 2014-12-12 DIAGNOSIS — K921 Melena: Secondary | ICD-10-CM | POA: Diagnosis present

## 2014-12-12 LAB — CBC
HCT: 35.3 % — ABNORMAL LOW (ref 39.0–52.0)
Hemoglobin: 11.1 g/dL — ABNORMAL LOW (ref 13.0–17.0)
MCH: 30.5 pg (ref 26.0–34.0)
MCHC: 31.4 g/dL (ref 30.0–36.0)
MCV: 97 fL (ref 78.0–100.0)
PLATELETS: 178 10*3/uL (ref 150–400)
RBC: 3.64 MIL/uL — ABNORMAL LOW (ref 4.22–5.81)
RDW: 17.1 % — ABNORMAL HIGH (ref 11.5–15.5)
WBC: 6.8 10*3/uL (ref 4.0–10.5)

## 2014-12-12 LAB — POC OCCULT BLOOD, ED: Fecal Occult Bld: NEGATIVE

## 2014-12-12 MED ORDER — CULTURELLE DIGESTIVE HEALTH PO CAPS: 1.0000 | ORAL_CAPSULE | Freq: Three times a day (TID) | ORAL | Status: DC

## 2014-12-12 NOTE — ED Notes (Signed)
Per EMS. Pt from [facility] home. Pt reports rectal bleeding for the past 2 years since surgery. None of nurses or techs at facility have seen any rectal bleeding. Pt was here for same last night. Pt in no obvious distress.

## 2014-12-12 NOTE — ED Notes (Signed)
[facility] called. Report given to Mardene Celeste, SIC/Med Tech at [facility].

## 2014-12-12 NOTE — ED Provider Notes (Signed)
CSN: [id number]     Arrival date & time 12/11/14  2352 History   First MD Initiated Contact with Patient 12/11/14 2357     Chief Complaint  Patient presents with  . Rectal Bleeding    x 26yrs     (Consider location/radiation/quality/duration/timing/severity/associated sxs/prior Treatment) Patient is a 69 y.o. male presenting with hematochezia. The history is provided by the patient.  Rectal Bleeding Quality:  Bright red Amount:  Scant Timing:  Rare Progression:  Unchanged Chronicity:  New Context: not anal fissures   Similar prior episodes: yes   Relieved by:  Nothing Worsened by:  Nothing tried Ineffective treatments:  None tried Associated symptoms: no abdominal pain and no fever   Risk factors: no anticoagulant use     Past Medical History  Diagnosis Date  . Depression   . Hypomagnesemia   . Fall at nursing home 05/27/2013    "slipped on water; said I broke my right hip" (05/27/2013)  . Migraine     "used to have them bad; haven't had one in awhile" (05/27/2013)  . Gout attack     "not too long ago; in my right foot" (05/27/2013)  . Complete heart block     reason for pacemaker/notes 05/27/2013  . Atrial fibrillation     [name] 05/27/2013  . HLD (hyperlipidemia)   . GIB (gastrointestinal bleeding)   . BPH (benign prostatic hyperplasia)   . Presence of permanent cardiac pacemaker   . Hypertension    Past Surgical History  Procedure Laterality Date  . Insert / replace / remove pacemaker    . Appendectomy    . Cholecystectomy    . Open reduction of hip Right 05/28/2013    Procedure: OPEN REDUCTION OF HIP;  Surgeon: Renette Butters, MD;  Location: [place];  Service: Orthopedics;  Laterality: Right;  . Esophagogastroduodenoscopy (egd) with propofol N/A 09/26/2014    Procedure: ESOPHAGOGASTRODUODENOSCOPY (EGD) WITH PROPOFOL;  Surgeon: Jerene Bears, MD;  Location: [facility] ENDOSCOPY;  Service: Endoscopy;  Laterality: N/A;  . Colonoscopy N/A 10/01/2014    Procedure:  COLONOSCOPY;  Surgeon: Jerene Bears, MD;  Location: [facility] ENDOSCOPY;  Service: Endoscopy;  Laterality: N/A;  . Partial colectomy N/A 10/05/2014    Procedure: SUBTOTAL COLECTOMY WITH ILEORECTAL ANASTOMOSIS;  Surgeon: Donnie Mesa, MD;  Location: [place];  Service: General;  Laterality: N/A;  . Flexible sigmoidoscopy N/A 11/07/2014    Procedure: FLEXIBLE SIGMOIDOSCOPY;  Surgeon: Jerene Bears, MD;  Location: [facility] ENDOSCOPY;  Service: Endoscopy;  Laterality: N/A;   Family History  Problem Relation Age of Onset  . Colon cancer Mother    History  Substance Use Topics  . Smoking status: Current Some Day Smoker -- 0.50 packs/day for 66 years    Types: Cigarettes  . Smokeless tobacco: Former Systems developer    Types: Snuff, Chew     Comment: 05/27/2013 "haven't used chew or snuff in ~ 30 yr"  . Alcohol Use: No     Comment: 05/27/2013 "none in years; never had problem w/it" - not in 30 years    Review of Systems  Constitutional: Negative for fever.  Cardiovascular: Negative for chest pain.  Gastrointestinal: Positive for hematochezia. Negative for abdominal pain.  All other systems reviewed and are negative.     Allergies  Review of patient's allergies indicates no known allergies.  Home Medications   Prior to Admission medications   Medication Sig Start Date End Date Taking? Authorizing Provider  acetaminophen (TYLENOL) 500 MG tablet Take 1,000 mg  by mouth every 6 (six) hours as needed for moderate pain.   Yes Historical Provider, MD  Calcium Carbonate-Vit D-Min (CALTRATE 600+D PLUS MINERALS) 600-800 MG-UNIT TABS Take 1 tablet by mouth daily.   Yes Historical Provider, MD  carvedilol (COREG) 6.25 MG tablet Take 1 tablet (6.25 mg total) by mouth 2 (two) times daily with a meal. 11/16/14  Yes Velvet Bathe, MD  finasteride (PROSCAR) 5 MG tablet Take 5 mg by mouth daily.   Yes Historical Provider, MD  magnesium oxide (MAG-OX) 400 MG tablet Take 400 mg by mouth 2 (two) times daily.   Yes Historical Provider,  MD  pravastatin (PRAVACHOL) 10 MG tablet Take 10 mg by mouth at bedtime.   Yes Historical Provider, MD  sertraline (ZOLOFT) 50 MG tablet Take 50 mg by mouth every morning.    Yes Historical Provider, MD  traMADol (ULTRAM) 50 MG tablet Take 50 mg by mouth every 6 (six) hours as needed for moderate pain.   Yes Historical Provider, MD  ciprofloxacin (CIPRO) 500 MG tablet Take 1 tablet (500 mg total) by mouth 2 (two) times daily. One po bid x 7 days Patient not taking: Reported on 12/10/2014 11/29/14   Veryl Speak, MD  HYDROcodone-acetaminophen (NORCO/VICODIN) 5-325 MG per tablet Take 1 tablet by mouth 2 (two) times daily as needed for severe pain. 12/05/14   Everlene Balls, MD  loperamide (IMODIUM A-D) 2 MG tablet Take 2 mg by mouth 4 (four) times daily as needed for diarrhea or loose stools.    Historical Provider, MD  metroNIDAZOLE (FLAGYL) 500 MG tablet Take 1 tablet (500 mg total) by mouth 3 (three) times daily. One po bid x 7 days Patient not taking: Reported on 12/10/2014 11/29/14   Veryl Speak, MD  metroNIDAZOLE (FLAGYL) 500 MG tablet Take 500 mg by mouth 3 (three) times daily. 11/30/14   Historical Provider, MD   BP 128/77 mmHg  Pulse 71  Temp(Src) 97.6 F (36.4 C) (Oral)  Ht 6' (1.829 m)  Wt 80 lb (36.288 kg)  BMI 10.85 kg/m2  SpO2 100% Physical Exam  Constitutional: He is oriented to person, place, and time. He appears well-developed and well-nourished. No distress.  HENT:  Head: Normocephalic and atraumatic.  Mouth/Throat: Oropharynx is clear and moist.  Eyes: EOM are normal. Pupils are equal, round, and reactive to light.  Neck: Normal range of motion. Neck supple.  Cardiovascular: Normal rate, regular rhythm and intact distal pulses.   Pulmonary/Chest: Effort normal and breath sounds normal. No respiratory distress. He has no wheezes. He has no rales.  Abdominal: Soft. Bowel sounds are normal. There is no tenderness. There is no rebound and no guarding.  Musculoskeletal: Normal range  of motion.  Neurological: He is alert and oriented to person, place, and time.  Skin: Skin is warm and dry.  Psychiatric: He has a normal mood and affect.    ED Course  Procedures (including critical care time) Labs Review Labs Reviewed  POC OCCULT BLOOD, ED  I-STAT CHEM 8, ED    Imaging Review No results found.   EKG Interpretation None      MDM   Final diagnoses:  None    No bleeding on exam H/H stable.  Will prescribe cuturelle and close follow up    Gohan Collister, MD 12/12/14 [id number]

## 2014-12-12 NOTE — ED Notes (Signed)
MD d/c'd CBC.

## 2014-12-12 NOTE — ED Notes (Signed)
PTAR at bedside to transport patient 

## 2014-12-12 NOTE — ED Notes (Signed)
Report called to staff at [facility].  Edwena Felty Soil scientist) was given report.  PTAR called for patient transport.

## 2014-12-12 NOTE — Progress Notes (Signed)
CSW met with patient at bedside. There was no family present. Patient confirms that he is from [facility]. Patient states that he has been living at the facility for the past 14 years. He states he is in the independent living unit. According to pt, he does not receive assistance with completing his ADL's. Also, he states that he has not fallen within the past 6 months. Patient states that he fell 2 years ago and broke his hip.  Patient informed CSW that he feels he is living in the appropriate level of care. Patient states that he does not have a support system.   Patient confirms that he presents to [facility] due to rectal bleeding. Patient stated " I came up here last night. I was just pouring; Especially when I use the bathroom."  Patient informed CSW that he does not have any questions at this time.  Willette Brace [phone] ED CSW 12/12/2014 8:11 PM

## 2014-12-12 NOTE — Discharge Instructions (Signed)
Bloody Stools °Bloody stools means there is blood in your poop (stool). It is a sign that there is a problem somewhere in the digestive system. It is important for your doctor to find the cause of your bleeding, so the problem can be treated.  °HOME CARE °· Only take medicine as told by your doctor. °· Eat foods with fiber (prunes, bran cereals). °· Drink enough fluids to keep your pee (urine) clear or pale yellow. °· Sit in warm water (sitz bath) for 10 to 15 minutes as told by your doctor. °· Know how to take your medicines (enemas, suppositories) if advised by your doctor. °· Watch for signs that you are getting better or getting worse. °GET HELP RIGHT AWAY IF:  °· You are not getting better. °· You start to get better but then get worse again. °· You have new problems. °· You have severe bleeding from the place where poop comes out (rectum) that does not stop. °· You throw up (vomit) blood. °· You feel weak or pass out (faint). °· You have a fever. °MAKE SURE YOU:  °· Understand these instructions. °· Will watch your condition. °· Will get help right away if you are not doing well or get worse. °Document Released: 05/15/2009 Document Revised: 08/19/2011 Document Reviewed: 10/12/2010 °ExitCare® Patient Information ©2015 [employer]. This information is not intended to replace advice given to you by your health care provider. Make sure you discuss any questions you have with your health care provider. ° °

## 2014-12-12 NOTE — ED Provider Notes (Signed)
CSN: [id number]     Arrival date & time 12/12/14  1906 History   First MD Initiated Contact with Patient 12/12/14 1929     Chief Complaint  Patient presents with  . Rectal Bleeding     (Consider location/radiation/quality/duration/timing/severity/associated sxs/prior Treatment) HPI Comments: Pt seen for the same complain on 7/2, 7/3 - had guaic neg stools and normal Hb.  Patient is a 69 y.o. male presenting with hematochezia. The history is provided by the patient.  Rectal Bleeding Quality:  Black and tarry and maroon Amount:  Moderate Duration:  3 days Timing:  Intermittent Progression:  Unchanged Chronicity:  New Similar prior episodes: yes   Associated symptoms: no abdominal pain, no dizziness, no hematemesis and no light-headedness   Risk factors: no anticoagulant use     Past Medical History  Diagnosis Date  . Depression   . Hypomagnesemia   . Fall at nursing home 05/27/2013    "slipped on water; said I broke my right hip" (05/27/2013)  . Migraine     "used to have them bad; haven't had one in awhile" (05/27/2013)  . Gout attack     "not too long ago; in my right foot" (05/27/2013)  . Complete heart block     reason for pacemaker/notes 05/27/2013  . Atrial fibrillation     [name] 05/27/2013  . HLD (hyperlipidemia)   . GIB (gastrointestinal bleeding)   . BPH (benign prostatic hyperplasia)   . Presence of permanent cardiac pacemaker   . Hypertension    Past Surgical History  Procedure Laterality Date  . Insert / replace / remove pacemaker    . Appendectomy    . Cholecystectomy    . Open reduction of hip Right 05/28/2013    Procedure: OPEN REDUCTION OF HIP;  Surgeon: Renette Butters, MD;  Location: [place];  Service: Orthopedics;  Laterality: Right;  . Esophagogastroduodenoscopy (egd) with propofol N/A 09/26/2014    Procedure: ESOPHAGOGASTRODUODENOSCOPY (EGD) WITH PROPOFOL;  Surgeon: Jerene Bears, MD;  Location: [facility] ENDOSCOPY;  Service: Endoscopy;  Laterality: N/A;   . Colonoscopy N/A 10/01/2014    Procedure: COLONOSCOPY;  Surgeon: Jerene Bears, MD;  Location: [facility] ENDOSCOPY;  Service: Endoscopy;  Laterality: N/A;  . Partial colectomy N/A 10/05/2014    Procedure: SUBTOTAL COLECTOMY WITH ILEORECTAL ANASTOMOSIS;  Surgeon: Donnie Mesa, MD;  Location: [place];  Service: General;  Laterality: N/A;  . Flexible sigmoidoscopy N/A 11/07/2014    Procedure: FLEXIBLE SIGMOIDOSCOPY;  Surgeon: Jerene Bears, MD;  Location: [facility] ENDOSCOPY;  Service: Endoscopy;  Laterality: N/A;   Family History  Problem Relation Age of Onset  . Colon cancer Mother    History  Substance Use Topics  . Smoking status: Current Some Day Smoker -- 0.50 packs/day for 66 years    Types: Cigarettes  . Smokeless tobacco: Former Systems developer    Types: Snuff, Chew     Comment: 05/27/2013 "haven't used chew or snuff in ~ 30 yr"  . Alcohol Use: No     Comment: 05/27/2013 "none in years; never had problem w/it" - not in 30 years    Review of Systems  Constitutional: Negative for activity change and appetite change.  Respiratory: Negative for cough and shortness of breath.   Cardiovascular: Negative for chest pain.  Gastrointestinal: Positive for hematochezia and anal bleeding. Negative for abdominal pain and hematemesis.  Genitourinary: Negative for dysuria.  Neurological: Negative for dizziness and light-headedness.      Allergies  Review of patient's allergies indicates no known  allergies.  Home Medications   Prior to Admission medications   Medication Sig Start Date End Date Taking? Authorizing Provider  acetaminophen (TYLENOL) 500 MG tablet Take 1,000 mg by mouth every 6 (six) hours as needed for moderate pain.   Yes Historical Provider, MD  Calcium Carbonate-Vit D-Min (CALTRATE 600+D PLUS MINERALS) 600-800 MG-UNIT TABS Take 1 tablet by mouth daily.   Yes Historical Provider, MD  carvedilol (COREG) 6.25 MG tablet Take 1 tablet (6.25 mg total) by mouth 2 (two) times daily with a meal. 11/16/14   Yes Velvet Bathe, MD  finasteride (PROSCAR) 5 MG tablet Take 5 mg by mouth daily.   Yes Historical Provider, MD  HYDROcodone-acetaminophen (NORCO/VICODIN) 5-325 MG per tablet Take 1 tablet by mouth 2 (two) times daily as needed for severe pain. 12/05/14  Yes Everlene Balls, MD  loperamide (IMODIUM A-D) 2 MG tablet Take 2 mg by mouth 4 (four) times daily as needed for diarrhea or loose stools.   Yes Historical Provider, MD  magnesium oxide (MAG-OX) 400 MG tablet Take 400 mg by mouth 2 (two) times daily.   Yes Historical Provider, MD  metroNIDAZOLE (FLAGYL) 500 MG tablet Take 500 mg by mouth 3 (three) times daily. 11/30/14  Yes Historical Provider, MD  pravastatin (PRAVACHOL) 10 MG tablet Take 10 mg by mouth at bedtime.   Yes Historical Provider, MD  sertraline (ZOLOFT) 50 MG tablet Take 50 mg by mouth every morning.    Yes Historical Provider, MD  traMADol (ULTRAM) 50 MG tablet Take 50 mg by mouth every 6 (six) hours as needed for moderate pain.   Yes Historical Provider, MD  ciprofloxacin (CIPRO) 500 MG tablet Take 1 tablet (500 mg total) by mouth 2 (two) times daily. One po bid x 7 days Patient not taking: Reported on 12/10/2014 11/29/14   Veryl Speak, MD  Lactobacillus-Inulin ([place]) CAPS Take 1 capsule by mouth 3 (three) times daily. Patient not taking: Reported on 12/12/2014 12/12/14   April Palumbo, MD  metroNIDAZOLE (FLAGYL) 500 MG tablet Take 1 tablet (500 mg total) by mouth 3 (three) times daily. One po bid x 7 days Patient not taking: Reported on 12/10/2014 11/29/14   Veryl Speak, MD   BP 115/54 mmHg  Pulse 84  Temp(Src) 97.8 F (36.6 C) (Oral)  Resp 18  SpO2 98% Physical Exam  Constitutional: He is oriented to person, place, and time. He appears well-developed.  HENT:  Head: Normocephalic and atraumatic.  Eyes: Conjunctivae and EOM are normal. Pupils are equal, round, and reactive to light.  Neck: Normal range of motion. Neck supple.  Cardiovascular: Normal rate and  regular rhythm.   Pulmonary/Chest: Effort normal and breath sounds normal.  Abdominal: Soft. Bowel sounds are normal. He exhibits no distension. There is no tenderness. There is no rebound and no guarding.  Neurological: He is alert and oriented to person, place, and time.  Skin: Skin is warm.  Nursing note and vitals reviewed.   ED Course  Procedures (including critical care time) Labs Review Labs Reviewed  CBC - Abnormal; Notable for the following:    RBC 3.64 (*)    Hemoglobin 11.1 (*)    HCT 35.3 (*)    RDW 17.1 (*)    All other components within normal limits    Imaging Review No results found.   EKG Interpretation None      MDM   Final diagnoses:  Dark stools    Pt with blood stools. Hb is normal. Has GI  f/u this week. He has had 2 ER visits the last 2 days - with normal Hb on each visit and guaic neg stools.  Repeat CBC here is same/normal. Will d/c, and have pt see GI as planned. Pt in agreement with the plan - he is just scared to see what appears to be bloody stools to him, and so comes to the ER.    [name], MD 12/12/14 2308

## 2014-12-12 NOTE — ED Notes (Signed)
Bed: [facility] Expected date:  Expected time:  Means of arrival:  Comments: diarrhea

## 2014-12-13 LAB — I-STAT CHEM 8, ED
BUN: 35 mg/dL — ABNORMAL HIGH (ref 6–20)
CHLORIDE: 109 mmol/L (ref 101–111)
Calcium, Ion: 1.22 mmol/L (ref 1.13–1.30)
Creatinine, Ser: 0.8 mg/dL (ref 0.61–1.24)
GLUCOSE: 166 mg/dL — AB (ref 65–99)
HCT: 39 % (ref 39.0–52.0)
HEMOGLOBIN: 13.3 g/dL (ref 13.0–17.0)
POTASSIUM: 4 mmol/L (ref 3.5–5.1)
SODIUM: 137 mmol/L (ref 135–145)
TCO2: 18 mmol/L (ref 0–100)

## 2014-12-14 ENCOUNTER — Encounter (HOSPITAL_COMMUNITY): Payer: Self-pay

## 2014-12-14 ENCOUNTER — Emergency Department (HOSPITAL_COMMUNITY)
Admission: EM | Admit: 2014-12-14 | Discharge: 2014-12-15 | Disposition: A | Discharge status: Home / Self Care | Payer: Medicare Other | Attending: Emergency Medicine | Admitting: Emergency Medicine

## 2014-12-14 ENCOUNTER — Emergency Department (HOSPITAL_COMMUNITY): Payer: Medicare Other

## 2014-12-14 DIAGNOSIS — F329 Major depressive disorder, single episode, unspecified: Secondary | ICD-10-CM | POA: Insufficient documentation

## 2014-12-14 DIAGNOSIS — Z8719 Personal history of other diseases of the digestive system: Secondary | ICD-10-CM | POA: Insufficient documentation

## 2014-12-14 DIAGNOSIS — Z720 Tobacco use: Secondary | ICD-10-CM | POA: Insufficient documentation

## 2014-12-14 DIAGNOSIS — Z9181 History of falling: Secondary | ICD-10-CM | POA: Diagnosis not present

## 2014-12-14 DIAGNOSIS — F1110 Opioid abuse, uncomplicated: Secondary | ICD-10-CM | POA: Insufficient documentation

## 2014-12-14 DIAGNOSIS — R109 Unspecified abdominal pain: Secondary | ICD-10-CM

## 2014-12-14 DIAGNOSIS — R1084 Generalized abdominal pain: Secondary | ICD-10-CM | POA: Diagnosis present

## 2014-12-14 DIAGNOSIS — I10 Essential (primary) hypertension: Secondary | ICD-10-CM | POA: Diagnosis not present

## 2014-12-14 DIAGNOSIS — Z87438 Personal history of other diseases of male genital organs: Secondary | ICD-10-CM | POA: Insufficient documentation

## 2014-12-14 DIAGNOSIS — F321 Major depressive disorder, single episode, moderate: Secondary | ICD-10-CM | POA: Diagnosis present

## 2014-12-14 DIAGNOSIS — E785 Hyperlipidemia, unspecified: Secondary | ICD-10-CM | POA: Insufficient documentation

## 2014-12-14 DIAGNOSIS — Z950 Presence of cardiac pacemaker: Secondary | ICD-10-CM | POA: Insufficient documentation

## 2014-12-14 DIAGNOSIS — Z79899 Other long term (current) drug therapy: Secondary | ICD-10-CM | POA: Diagnosis not present

## 2014-12-14 LAB — RAPID URINE DRUG SCREEN, HOSP PERFORMED
AMPHETAMINES: NOT DETECTED
BARBITURATES: NOT DETECTED
Benzodiazepines: NOT DETECTED
Cocaine: NOT DETECTED
Opiates: POSITIVE — AB
TETRAHYDROCANNABINOL: NOT DETECTED

## 2014-12-14 LAB — URINALYSIS, ROUTINE W REFLEX MICROSCOPIC
Bilirubin Urine: NEGATIVE
GLUCOSE, UA: NEGATIVE mg/dL
Hgb urine dipstick: NEGATIVE
KETONES UR: NEGATIVE mg/dL
Leukocytes, UA: NEGATIVE
NITRITE: NEGATIVE
PH: 5.5 (ref 5.0–8.0)
Protein, ur: NEGATIVE mg/dL
SPECIFIC GRAVITY, URINE: 1.018 (ref 1.005–1.030)
Urobilinogen, UA: 0.2 mg/dL (ref 0.0–1.0)

## 2014-12-14 LAB — COMPREHENSIVE METABOLIC PANEL
ALT: 10 U/L — ABNORMAL LOW (ref 17–63)
AST: 18 U/L (ref 15–41)
Albumin: 3.3 g/dL — ABNORMAL LOW (ref 3.5–5.0)
Alkaline Phosphatase: 84 U/L (ref 38–126)
Anion gap: 6 (ref 5–15)
BUN: 20 mg/dL (ref 6–20)
CO2: 22 mmol/L (ref 22–32)
Calcium: 8.2 mg/dL — ABNORMAL LOW (ref 8.9–10.3)
Chloride: 109 mmol/L (ref 101–111)
Creatinine, Ser: 0.67 mg/dL (ref 0.61–1.24)
GFR calc Af Amer: >60 mL/min (ref >60)
GFR calc non Af Amer: >60 mL/min (ref >60)
GLUCOSE: 133 mg/dL — AB (ref 65–99)
POTASSIUM: 3.9 mmol/L (ref 3.5–5.1)
SODIUM: 137 mmol/L (ref 135–145)
TOTAL PROTEIN: 6.7 g/dL (ref 6.5–8.1)
Total Bilirubin: 0.2 mg/dL — ABNORMAL LOW (ref 0.3–1.2)

## 2014-12-14 LAB — CBC WITH DIFFERENTIAL/PLATELET
Basophils Absolute: 0 10*3/uL (ref 0.0–0.1)
Basophils Relative: 0 % (ref 0–1)
EOS PCT: 2 % (ref 0–5)
Eosinophils Absolute: 0.1 10*3/uL (ref 0.0–0.7)
HCT: 34.5 % — ABNORMAL LOW (ref 39.0–52.0)
Hemoglobin: 10.9 g/dL — ABNORMAL LOW (ref 13.0–17.0)
LYMPHS ABS: 2 10*3/uL (ref 0.7–4.0)
Lymphocytes Relative: 40 % (ref 12–46)
MCH: 30.8 pg (ref 26.0–34.0)
MCHC: 31.6 g/dL (ref 30.0–36.0)
MCV: 97.5 fL (ref 78.0–100.0)
Monocytes Absolute: 0.3 10*3/uL (ref 0.1–1.0)
Monocytes Relative: 6 % (ref 3–12)
NEUTROS ABS: 2.6 10*3/uL (ref 1.7–7.7)
NEUTROS PCT: 52 % (ref 43–77)
Platelets: 148 10*3/uL — ABNORMAL LOW (ref 150–400)
RBC: 3.54 MIL/uL — ABNORMAL LOW (ref 4.22–5.81)
RDW: 17 % — AB (ref 11.5–15.5)
WBC: 5.1 10*3/uL (ref 4.0–10.5)

## 2014-12-14 LAB — ETHANOL: Alcohol, Ethyl (B): <5 mg/dL (ref <5)

## 2014-12-14 LAB — LIPASE, BLOOD: LIPASE: 55 U/L — AB (ref 22–51)

## 2014-12-14 MED ORDER — MAGNESIUM OXIDE 400 (241.3 MG) MG PO TABS
400.0000 mg | ORAL_TABLET | Freq: Two times a day (BID) | ORAL | Status: DC
  Administered 2014-12-14 – 2014-12-15 (×2): 400 mg via ORAL
  Filled 2014-12-14 (×3): qty 1

## 2014-12-14 MED ORDER — CARVEDILOL 6.25 MG PO TABS
6.2500 mg | ORAL_TABLET | Freq: Two times a day (BID) | ORAL | Status: DC
  Administered 2014-12-14 – 2014-12-15 (×2): 6.25 mg via ORAL
  Filled 2014-12-14 (×4): qty 1

## 2014-12-14 MED ORDER — SODIUM CHLORIDE 0.9 % IV SOLN
1000.0000 mL | Freq: Once | INTRAVENOUS | Status: AC
  Administered 2014-12-14: 1000 mL via INTRAVENOUS

## 2014-12-14 MED ORDER — CALCIUM CARBONATE-VITAMIN D 500-200 MG-UNIT PO TABS
1.0000 | ORAL_TABLET | Freq: Every day | ORAL | Status: DC
  Administered 2014-12-15: 1 via ORAL
  Filled 2014-12-14: qty 1

## 2014-12-14 MED ORDER — SODIUM CHLORIDE 0.9 % IV SOLN
1000.0000 mL | INTRAVENOUS | Status: DC
  Administered 2014-12-14: 1000 mL via INTRAVENOUS

## 2014-12-14 MED ORDER — ACETAMINOPHEN 500 MG PO TABS
1000.0000 mg | ORAL_TABLET | Freq: Four times a day (QID) | ORAL | Status: DC | PRN
  Administered 2014-12-15: 1000 mg via ORAL
  Filled 2014-12-14: qty 2

## 2014-12-14 MED ORDER — FINASTERIDE 5 MG PO TABS
5.0000 mg | ORAL_TABLET | Freq: Every day | ORAL | Status: DC
  Administered 2014-12-15: 5 mg via ORAL
  Filled 2014-12-14: qty 1

## 2014-12-14 MED ORDER — ONDANSETRON HCL 4 MG/2ML IJ SOLN
4.0000 mg | Freq: Once | INTRAMUSCULAR | Status: AC
  Administered 2014-12-14: 4 mg via INTRAVENOUS
  Filled 2014-12-14: qty 2

## 2014-12-14 MED ORDER — SERTRALINE HCL 50 MG PO TABS
50.0000 mg | ORAL_TABLET | Freq: Every morning | ORAL | Status: DC
  Administered 2014-12-15: 50 mg via ORAL
  Filled 2014-12-14: qty 1

## 2014-12-14 MED ORDER — SACCHAROMYCES BOULARDII 250 MG PO CAPS
250.0000 mg | ORAL_CAPSULE | Freq: Two times a day (BID) | ORAL | Status: DC
  Administered 2014-12-14 – 2014-12-15 (×2): 250 mg via ORAL
  Filled 2014-12-14 (×3): qty 1

## 2014-12-14 MED ORDER — HYDROCODONE-ACETAMINOPHEN 5-325 MG PO TABS
1.0000 | ORAL_TABLET | Freq: Two times a day (BID) | ORAL | Status: DC | PRN
  Administered 2014-12-15 (×2): 1 via ORAL
  Filled 2014-12-14 (×3): qty 1

## 2014-12-14 MED ORDER — LOPERAMIDE HCL 2 MG PO CAPS
2.0000 mg | ORAL_CAPSULE | Freq: Four times a day (QID) | ORAL | Status: DC | PRN
  Administered 2014-12-15: 2 mg via ORAL
  Filled 2014-12-14: qty 1

## 2014-12-14 NOTE — ED Notes (Signed)
Upon communication with [facility] staff, in conjunction with our social worker and Dr. Tomi Bamberger; we will perform T.T.S. Consult.  PTAR here and are informed.
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
## 2014-12-14 NOTE — ED Notes (Addendum)
Pt. Is unable to use the restroom at this time, but there is a urinal at bedside.

## 2014-12-14 NOTE — Discharge Instructions (Signed)

## 2014-12-14 NOTE — Progress Notes (Addendum)
69 yr old male medicare/medicaid [place] access covered pt from [facility] with 12 ED visits and 5 admissions in last 6 months.  Pt has been in a [facility] ED every day of July 2016 except December 13 2014. Pt evaluated by ED medical staff 12/14/14 and found to be medically stable after c/o abdominal pain On today pt walked to a store near [facility] and called EMS.  PMH depression  CM spoke with pt and inquired why he returns everyday Pt states he is not getting medicine for his pain at [facility] Reports he last saw "doctor two weeks ago" "I take all the medicine they give me but it don't work"  CM discussed with pt that he should ask [employer] staff to have his pcp to change his medicine if it is not helping.  CM discussed with him that he can also call his pcp to discuss changing his medicine.  CM provided pt with Dr. Breck Coons. Janett Labella, MD Address: [address], [place], [place] [address]  Phone: [phone] on a sheet of paper in large print CM gave this to pt and watched him place it in his right pants pocket.  Pt agrees to call pcp prn for abdominal pain Pt reports not having any family, being an only child and not having access to a telephone in his room at [facility] He reports this is why he walked to convenience store up the street and had the store staff call ems for him

## 2014-12-14 NOTE — ED Notes (Signed)
Bed: SQ58 Expected date:  Expected time:  Means of arrival:  Comments: EMS- 69yo M, abdominal pain/psych evaluation

## 2014-12-14 NOTE — BH Assessment (Signed)
Assessment Note  [name] is an 69 y.o. male with history of depression. Patient has a history of chronic abdominal pain with recurrent visits to the emergency room. Patient states that he is here today because of stomach pain. Patient lives in a ALF ([facility]) and reports that staff are mean to him. Patient denies SI, HI, and AVH's. He denies history of all. He denies depression and anxiety. Patient sts that his only stressor is related to living at [facility]. Patient stating that they accuse him of stealing things. Patient admits that he becomes upset about their acquisitions and threatens staff. Patient admitting that he has threatened to call 911 and make bombs. Patient stating, "I will not stop threatening them until they treat me right". Patient has no history of violence toward others. Patient denies alcohol and drug use. Patient denies history of inpatient mental health treatment.   Collateral Information from Edson Snowball, LCSW 12/14/2014 2:42PM:  CSW spoke with RN, who states ALF is requesting psych eval. CSW spoke with [place] who states patient has been walking into other residents room, threatening staff, talking about making a bomb in the woods, calling 911, and going to the convenient store call 911. CSW asked if patient has been seen by outpatient psychiatrist, facility stated no. CSW inquired about patient being seen by in house dr. Per facility, in house dr felt that due to patient being calm and cooperative upon assessment no medication changes. CSW discussed with EDP who also talked with facility. Pt to been seen by psych and TTS.  Axis I:  Major Depressive Disorder NOS Axis II: Deferred Axis III:  Past Medical History  Diagnosis Date  . Depression   . Hypomagnesemia   . Fall at nursing home 05/27/2013    "slipped on water; said I broke my right hip" (05/27/2013)  . Migraine     "used to have them bad; haven't had one in awhile" (05/27/2013)  . Gout attack    "not too long ago; in my right foot" (05/27/2013)  . Complete heart block     reason for pacemaker/notes 05/27/2013  . Atrial fibrillation     [name] 05/27/2013  . HLD (hyperlipidemia)   . GIB (gastrointestinal bleeding)   . BPH (benign prostatic hyperplasia)   . Presence of permanent cardiac pacemaker   . Hypertension    Axis IV: other psychosocial or environmental problems, problems related to social environment, problems with access to health care services and problems with primary support group Axis V: 31-40 impairment in reality testing  Past Medical History:  Past Medical History  Diagnosis Date  . Depression   . Hypomagnesemia   . Fall at nursing home 05/27/2013    "slipped on water; said I broke my right hip" (05/27/2013)  . Migraine     "used to have them bad; haven't had one in awhile" (05/27/2013)  . Gout attack     "not too long ago; in my right foot" (05/27/2013)  . Complete heart block     reason for pacemaker/notes 05/27/2013  . Atrial fibrillation     [name] 05/27/2013  . HLD (hyperlipidemia)   . GIB (gastrointestinal bleeding)   . BPH (benign prostatic hyperplasia)   . Presence of permanent cardiac pacemaker   . Hypertension     Past Surgical History  Procedure Laterality Date  . Insert / replace / remove pacemaker    . Appendectomy    . Cholecystectomy    . Open  reduction of hip Right 05/28/2013    Procedure: OPEN REDUCTION OF HIP;  Surgeon: Renette Butters, MD;  Location: [facility];  Service: Orthopedics;  Laterality: Right;  . Esophagogastroduodenoscopy (egd) with propofol N/A 09/26/2014    Procedure: ESOPHAGOGASTRODUODENOSCOPY (EGD) WITH PROPOFOL;  Surgeon: Jerene Bears, MD;  Location: [facility] ENDOSCOPY;  Service: Endoscopy;  Laterality: N/A;  . Colonoscopy N/A 10/01/2014    Procedure: COLONOSCOPY;  Surgeon: Jerene Bears, MD;  Location: [facility] ENDOSCOPY;  Service: Endoscopy;  Laterality: N/A;  . Partial colectomy N/A 10/05/2014    Procedure: SUBTOTAL COLECTOMY  WITH ILEORECTAL ANASTOMOSIS;  Surgeon: Donnie Mesa, MD;  Location: [facility];  Service: General;  Laterality: N/A;  . Flexible sigmoidoscopy N/A 11/07/2014    Procedure: FLEXIBLE SIGMOIDOSCOPY;  Surgeon: Jerene Bears, MD;  Location: [facility] ENDOSCOPY;  Service: Endoscopy;  Laterality: N/A;    Family History:  Family History  Problem Relation Age of Onset  . Colon cancer Mother     Social History:  reports that he has been smoking Cigarettes.  He has a 33 pack-year smoking history. He has quit using smokeless tobacco. His smokeless tobacco use included Snuff and Chew. He reports that he does not drink alcohol or use illicit drugs.  Additional Social History:  Alcohol / Drug Use Pain Medications: SEE MAR Prescriptions: SEE MAR Over the Counter: SEE MAR History of alcohol / drug use?: No history of alcohol / drug abuse  CIWA: CIWA-Ar BP: 125/56 mmHg Pulse Rate: 78 COWS:    Allergies: No Known Allergies  Home Medications:  (Not in a hospital admission)  OB/GYN Status:  No LMP for male patient.  General Assessment Data Location of Assessment: [facility] Is this a Tele or Face-to-Face Assessment?: Face-to-Face Is this an Initial Assessment or a Re-assessment for this encounter?: Initial Assessment Marital status: Single Maiden name:  (n/a) Is patient pregnant?: No Pregnancy Status: No Living Arrangements: Other (Comment) (patient lives in a ALF ([place])) Can pt return to current living arrangement?: No Admission Status: Voluntary Is patient capable of signing voluntary admission?: Yes Referral Source: Self/Family/Friend Insurance type:  (MCR/MCD)     Crisis Care Plan Living Arrangements: Other (Comment) (patient lives in a ALF ([place])) Name of Psychiatrist:  (No psychiatrist ) Name of Therapist:  (No therapist )  Education Status Is patient currently in school?: No Highest grade of school patient has completed:  (n/a) Name of school:  (n/a) Contact person:  (n/a)  Risk  to self with the past 6 months Suicidal Ideation: No Has patient been a risk to self within the past 6 months prior to admission? : No Suicidal Intent: No Has patient had any suicidal intent within the past 6 months prior to admission? : No Is patient at risk for suicide?: No Suicidal Plan?: No Has patient had any suicidal plan within the past 6 months prior to admission? : No Access to Means: No What has been your use of drugs/alcohol within the last 12 months?:  (n/a) Previous Attempts/Gestures: No How many times?:  (n/a) Other Self Harm Risks:  (n/a) Triggers for Past Attempts: Other (Comment) (n/a) Intentional Self Injurious Behavior: None Family Suicide History: No Recent stressful life event(s): Other (Comment) Persecutory voices/beliefs?: No Depression: No Depression Symptoms:  (n/a) Substance abuse history and/or treatment for substance abuse?: No Suicide prevention information given to non-admitted patients: Not applicable  Risk to Others within the past 6 months Homicidal Ideation: No Does patient have any lifetime risk of violence toward others  beyond the six months prior to admission? : No Thoughts of Harm to Others: No Current Homicidal Intent: No Current Homicidal Plan: No Access to Homicidal Means: No Identified Victim:  (n/a) History of harm to others?: No Assessment of Violence: None Noted Violent Behavior Description:  (patient is calm and cooperative ) Does patient have access to weapons?: No Criminal Charges Pending?: No Does patient have a court date: No Is patient on probation?: No  Psychosis Hallucinations: None noted Delusions: None noted  Mental Status Report Appearance/Hygiene: Disheveled Eye Contact: Fair Motor Activity: Freedom of movement Speech: Logical/coherent Level of Consciousness: Alert Mood: Depressed Affect: Appropriate to circumstance Anxiety Level: None Thought Processes: Coherent Judgement: Impaired Orientation: Person,  Place, Time, Situation Obsessive Compulsive Thoughts/Behaviors: None  Cognitive Functioning Concentration: Normal Memory: Recent Intact, Remote Intact IQ: Average Insight: Fair Impulse Control: Good Appetite: Fair Weight Loss:  (none reported) Weight Gain:  (none reported) Sleep: No Change Total Hours of Sleep:  (n/a) Vegetative Symptoms: None  ADLScreening [facility] Assessment Services) Patient's cognitive ability adequate to safely complete daily activities?: Yes Patient able to express need for assistance with ADLs?: No Independently performs ADLs?: Yes (appropriate for developmental age)  Prior Inpatient Therapy Prior Inpatient Therapy: No Prior Therapy Dates:  (n/a) Prior Therapy Facilty/Provider(s):  (n/a) Reason for Treatment:  (n/a)  Prior Outpatient Therapy Prior Outpatient Therapy: No Prior Therapy Dates:  (n/a) Prior Therapy Facilty/Provider(s):  (n/a) Reason for Treatment:  (n/a) Does patient have an ACCT team?: No Does patient have Intensive In-House Services?  : No Does patient have [facility] services? : No Does patient have P4CC services?: No  ADL Screening (condition at time of admission) Patient's cognitive ability adequate to safely complete daily activities?: Yes Is the patient deaf or have difficulty hearing?: No Does the patient have difficulty seeing, even when wearing glasses/contacts?: No Does the patient have difficulty concentrating, remembering, or making decisions?: Yes Patient able to express need for assistance with ADLs?: No Does the patient have difficulty dressing or bathing?: No Independently performs ADLs?: Yes (appropriate for developmental age) Communication: Independent Dressing (OT): Independent Grooming: Independent Bathing: Independent Toileting: Independent Does the patient have difficulty walking or climbing stairs?: No Weakness of Legs: None Weakness of Arms/Hands: None       Abuse/Neglect Assessment (Assessment to be  complete while patient is alone) Physical Abuse: Denies Verbal Abuse: Denies Sexual Abuse: Denies Exploitation of patient/patient's resources: Denies Self-Neglect: Denies Values / Beliefs Cultural Requests During Hospitalization: None Spiritual Requests During Hospitalization: None   Advance Directives (For Healthcare) Does patient have an advance directive?: No Would patient like information on creating an advanced directive?: No - patient declined information Does patient want to make changes to advanced directive?: No - Patient declined Copy of advanced directive(s) in chart?: No - copy requested    Additional Information 1:1 In Past 12 Months?: No CIRT Risk: No Elopement Risk: No Does patient have medical clearance?: Yes     Disposition:  Disposition Initial Assessment Completed for this Encounter: Yes Disposition of Patient: Other dispositions (Pt to remain in the ED overnight for observation, per [facility]) Other disposition(s):  (Pt to remain in the ED overnight for observation; per [facility])  On Site Evaluation by:   Reviewed with Physician:    Waldon Merl [facility] 12/14/2014 7:02 PM

## 2014-12-14 NOTE — ED Notes (Signed)
He has had T.T.S. Eval. And remains in no distress.  I have given report to Geni Bers, RN in [place] and we will escort pt. There shortly after dressing/security wanding.

## 2014-12-14 NOTE — ED Notes (Signed)
Patient is resting comfortably. 

## 2014-12-14 NOTE — BH Assessment (Signed)
[place] Assessment Progress Note   Called and scheduled pt's tele assessment with this clinician.  Called EDP Tomi Bamberger and gathered clinical information on the pt.  Shaune Pascal, MS, [facility] Therapeutic Triage Specialist [facility]

## 2014-12-14 NOTE — Progress Notes (Signed)
CSW spoke with RN, who states ALF is requesting psych eval. CSW spoke with [place] who states patient has been walking into other residents room, threatening staff, talking about making a bomb in the woods, calling 911, and going to the convenient store call 911. CSW asked if patient has been seen by outpatient psychiatrist, facility stated no. CSW inquired about patient being seen by in house dr. Per facility, in house dr felt that due to patient being calm and coopeartive upon assessment no medication changes. CSW discussed with EDP who also talked with facility. Pt to been seen by psych and tts.   Belia Heman, [place] Work  [employer] [phone]

## 2014-12-14 NOTE — ED Provider Notes (Signed)
CSN: [id number]     Arrival date & time 12/14/14  0955 History   First MD Initiated Contact with Patient 12/14/14 1020     Chief Complaint  Patient presents with  . Abdominal Pain   HPI Patient presents to the emergency room for evaluation of abdominal pain. Patient has a history of chronic abdominal pain with recurrent visits to the emergency room. Patient states that his entire stomach has been hurting and cramping. This has been going on at least for several months. Today the pain increased again so he called 911. Patient states he was scheduled to see his GI doctor but could not wait to do so. She denies any vomiting. He does have several episodes of loose stools daily. He has mentioned dark stools and possible blood previously but has not noted any today. She was seen in the emergency room 2 days ago for the same complaints. He also had been seen several times prior to that. Patient denies any fevers. As any chest pain or shortness of breath. No urinary symptoms.  The patient is a resident of [facility]. Staff at the facility also mentioned the patient has become increasingly aggressive and has been causing some vandalism. Pt denies SI or HI. Past Medical History  Diagnosis Date  . Depression   . Hypomagnesemia   . Fall at nursing home 05/27/2013    "slipped on water; said I broke my right hip" (05/27/2013)  . Migraine     "used to have them bad; haven't had one in awhile" (05/27/2013)  . Gout attack     "not too long ago; in my right foot" (05/27/2013)  . Complete heart block     reason for pacemaker/notes 05/27/2013  . Atrial fibrillation     [name] 05/27/2013  . HLD (hyperlipidemia)   . GIB (gastrointestinal bleeding)   . BPH (benign prostatic hyperplasia)   . Presence of permanent cardiac pacemaker   . Hypertension    Past Surgical History  Procedure Laterality Date  . Insert / replace / remove pacemaker    . Appendectomy    . Cholecystectomy    . Open reduction of  hip Right 05/28/2013    Procedure: OPEN REDUCTION OF HIP;  Surgeon: Renette Butters, MD;  Location: [facility];  Service: Orthopedics;  Laterality: Right;  . Esophagogastroduodenoscopy (egd) with propofol N/A 09/26/2014    Procedure: ESOPHAGOGASTRODUODENOSCOPY (EGD) WITH PROPOFOL;  Surgeon: Jerene Bears, MD;  Location: [facility] ENDOSCOPY;  Service: Endoscopy;  Laterality: N/A;  . Colonoscopy N/A 10/01/2014    Procedure: COLONOSCOPY;  Surgeon: Jerene Bears, MD;  [facility] ENDOSCOPY;  Service: Endoscopy;  Laterality: N/A;  . Partial colectomy N/A 10/05/2014    Procedure: SUBTOTAL COLECTOMY WITH ILEORECTAL ANASTOMOSIS;  Surgeon: Donnie Mesa, MD;  Location: [place];  Service: General;  Laterality: N/A;  . Flexible sigmoidoscopy N/A 11/07/2014    Procedure: FLEXIBLE SIGMOIDOSCOPY;  Surgeon: Jerene Bears, MD;  Location: [facility] ENDOSCOPY;  Service: Endoscopy;  Laterality: N/A;   Family History  Problem Relation Age of Onset  . Colon cancer Mother    History  Substance Use Topics  . Smoking status: Current Some Day Smoker -- 0.50 packs/day for 66 years    Types: Cigarettes  . Smokeless tobacco: Former Systems developer    Types: Snuff, Chew     Comment: 05/27/2013 "haven't used chew or snuff in ~ 30 yr"  . Alcohol Use: No     Comment: 05/27/2013 "none in years; never had problem  w/it" - not in 30 years    Review of Systems  All other systems reviewed and are negative.     Allergies  Review of patient's allergies indicates no known allergies.  Home Medications   Prior to Admission medications   Medication Sig Start Date End Date Taking? Authorizing Provider  acetaminophen (TYLENOL) 500 MG tablet Take 1,000 mg by mouth every 6 (six) hours as needed for moderate pain.   Yes Historical Provider, MD  Calcium Carbonate-Vit D-Min (CALTRATE 600+D PLUS MINERALS) 600-800 MG-UNIT TABS Take 1 tablet by mouth daily.   Yes Historical Provider, MD  carvedilol (COREG) 6.25 MG tablet Take 1 tablet (6.25 mg total) by mouth 2  (two) times daily with a meal. 11/16/14  Yes Velvet Bathe, MD  finasteride (PROSCAR) 5 MG tablet Take 5 mg by mouth daily.   Yes Historical Provider, MD  HYDROcodone-acetaminophen (NORCO/VICODIN) 5-325 MG per tablet Take 1 tablet by mouth 2 (two) times daily as needed for severe pain. 12/05/14  Yes Everlene Balls, MD  loperamide (IMODIUM A-D) 2 MG tablet Take 2 mg by mouth 4 (four) times daily as needed for diarrhea or loose stools.   Yes Historical Provider, MD  magnesium oxide (MAG-OX) 400 MG tablet Take 400 mg by mouth 2 (two) times daily.   Yes Historical Provider, MD  pravastatin (PRAVACHOL) 10 MG tablet Take 10 mg by mouth at bedtime.   Yes Historical Provider, MD  sertraline (ZOLOFT) 50 MG tablet Take 50 mg by mouth every morning.    Yes Historical Provider, MD  traMADol (ULTRAM) 50 MG tablet Take 50 mg by mouth every 6 (six) hours as needed for moderate pain.   Yes Historical Provider, MD  ciprofloxacin (CIPRO) 500 MG tablet Take 1 tablet (500 mg total) by mouth 2 (two) times daily. One po bid x 7 days Patient not taking: Reported on 12/10/2014 11/29/14   Veryl Speak, MD  Lactobacillus-Inulin ([place]) CAPS Take 1 capsule by mouth 3 (three) times daily. Patient not taking: Reported on 12/12/2014 12/12/14   April Palumbo, MD  metroNIDAZOLE (FLAGYL) 500 MG tablet Take 1 tablet (500 mg total) by mouth 3 (three) times daily. One po bid x 7 days Patient not taking: Reported on 12/10/2014 11/29/14   Veryl Speak, MD   BP 117/49 mmHg  Pulse 81  Temp(Src) 98.5 F (36.9 C) (Oral)  Resp 16  SpO2 97% Physical Exam  Constitutional: He appears well-developed and well-nourished. No distress.  HENT:  Head: Normocephalic and atraumatic.  Right Ear: External ear normal.  Left Ear: External ear normal.  Eyes: Conjunctivae are normal. Right eye exhibits no discharge. Left eye exhibits no discharge. No scleral icterus.  Neck: Neck supple. No tracheal deviation present.  Cardiovascular: Normal  rate, regular rhythm and intact distal pulses.   Pulmonary/Chest: Effort normal and breath sounds normal. No stridor. No respiratory distress. He has no wheezes. He has no rales.  Abdominal: Soft. Bowel sounds are normal. He exhibits no distension and no mass. There is tenderness (mild generalized). There is no rebound and no guarding.  Musculoskeletal: He exhibits no edema or tenderness.  Neurological: He is alert. He has normal strength. No cranial nerve deficit (no facial droop, extraocular movements intact, no slurred speech) or sensory deficit. He exhibits normal muscle tone. He displays no seizure activity. Coordination normal.  Skin: Skin is warm and dry. No rash noted.  Psychiatric: He has a normal mood and affect. He does not exhibit a depressed mood. He expresses  no homicidal and no suicidal ideation.  Nursing note and vitals reviewed.   ED Course  Procedures (including critical care time) Labs Review Labs Reviewed  CBC WITH DIFFERENTIAL/PLATELET - Abnormal; Notable for the following:    RBC 3.54 (*)    Hemoglobin 10.9 (*)    HCT 34.5 (*)    RDW 17.0 (*)    Platelets 148 (*)    All other components within normal limits  COMPREHENSIVE METABOLIC PANEL - Abnormal; Notable for the following:    Glucose, Bld 133 (*)    Calcium 8.2 (*)    Albumin 3.3 (*)    ALT 10 (*)    Total Bilirubin 0.2 (*)    All other components within normal limits  LIPASE, BLOOD - Abnormal; Notable for the following:    Lipase 55 (*)    All other components within normal limits  URINE RAPID DRUG SCREEN, HOSP PERFORMED - Abnormal; Notable for the following:    Opiates POSITIVE (*)    All other components within normal limits  URINALYSIS, ROUTINE W REFLEX MICROSCOPIC (NOT AT [facility])  ETHANOL    Imaging Review Dg Abd Acute W/chest  12/14/2014   CLINICAL DATA:  Mid abdominal pain for 6 months. Diarrhea. Prior partial colectomy.  EXAM: DG ABDOMEN ACUTE W/ 1V CHEST  COMPARISON:  Chest radiographs  12/10/2014. CT abdomen and pelvis 11/29/2014.  FINDINGS: Dual lead pacemaker remains in place. Cardiac silhouette remains enlarged. Lungs remain hyperinflated. No airspace consolidation, edema, pleural effusion, or pneumothorax is identified. Focal sclerosis is again noted in the posterior right fourth rib.  There is no evidence of intraperitoneal free air. Gas is present in nondilated loops of small and large bowel without evidence of obstruction. Anastomotic staple line is noted in the pelvis. Right upper quadrant abdominal surgical clips are noted. Internal fixation of the right femur is partially visualized.  IMPRESSION: 1. No evidence of acute cardiopulmonary disease. 2. Nonobstructed bowel-gas pattern.   Electronically Signed   By: Logan Bores   On: 12/14/2014 11:31    Medications  0.9 %  sodium chloride infusion (0 mLs Intravenous Stopped 12/14/14 1130)    Followed by  0.9 %  sodium chloride infusion (1,000 mLs Intravenous New Bag/Given 12/14/14 1131)  ondansetron (ZOFRAN) injection 4 mg (4 mg Intravenous Given 12/14/14 1055)     MDM   Final diagnoses:  Abdominal pain, unspecified abdominal location    Pt has been having issues with persistent diarrhea and recurrent abdominal pain.  No acute findings noted on AAS or labs.  Anemia is stable.  Pt has had recent guiac negative stools.  Pt denies any SI or HI.  He has remained calm and cooperative.  Stable for outpatient follow up.  At this time there does not appear to be any evidence of an acute emergency medical condition and the patient appears stable for discharge with appropriate outpatient follow up. \   Dorie Rank, MD 12/14/14 [phone]

## 2014-12-14 NOTE — ED Notes (Signed)
He remains in no distress and I have just phoned [facility] for transport back to [facility]

## 2014-12-15 ENCOUNTER — Other Ambulatory Visit: Payer: Self-pay

## 2014-12-15 ENCOUNTER — Emergency Department (HOSPITAL_COMMUNITY)
Admission: EM | Admit: 2014-12-15 | Discharge: 2014-12-16 | Disposition: A | Discharge status: Home / Self Care | Payer: Medicare Other | Attending: Emergency Medicine | Admitting: Emergency Medicine

## 2014-12-15 ENCOUNTER — Emergency Department (HOSPITAL_COMMUNITY): Payer: Medicare Other

## 2014-12-15 ENCOUNTER — Encounter (HOSPITAL_COMMUNITY): Payer: Self-pay | Admitting: Emergency Medicine

## 2014-12-15 DIAGNOSIS — F329 Major depressive disorder, single episode, unspecified: Secondary | ICD-10-CM | POA: Insufficient documentation

## 2014-12-15 DIAGNOSIS — Z720 Tobacco use: Secondary | ICD-10-CM | POA: Insufficient documentation

## 2014-12-15 DIAGNOSIS — R0789 Other chest pain: Secondary | ICD-10-CM | POA: Diagnosis not present

## 2014-12-15 DIAGNOSIS — R1084 Generalized abdominal pain: Secondary | ICD-10-CM | POA: Diagnosis not present

## 2014-12-15 DIAGNOSIS — Z8669 Personal history of other diseases of the nervous system and sense organs: Secondary | ICD-10-CM | POA: Insufficient documentation

## 2014-12-15 DIAGNOSIS — Z79899 Other long term (current) drug therapy: Secondary | ICD-10-CM | POA: Insufficient documentation

## 2014-12-15 DIAGNOSIS — E785 Hyperlipidemia, unspecified: Secondary | ICD-10-CM | POA: Diagnosis not present

## 2014-12-15 DIAGNOSIS — I4891 Unspecified atrial fibrillation: Secondary | ICD-10-CM | POA: Insufficient documentation

## 2014-12-15 DIAGNOSIS — R079 Chest pain, unspecified: Secondary | ICD-10-CM | POA: Diagnosis present

## 2014-12-15 DIAGNOSIS — Z950 Presence of cardiac pacemaker: Secondary | ICD-10-CM | POA: Diagnosis not present

## 2014-12-15 DIAGNOSIS — N400 Enlarged prostate without lower urinary tract symptoms: Secondary | ICD-10-CM | POA: Diagnosis not present

## 2014-12-15 DIAGNOSIS — Z8739 Personal history of other diseases of the musculoskeletal system and connective tissue: Secondary | ICD-10-CM | POA: Diagnosis not present

## 2014-12-15 DIAGNOSIS — I10 Essential (primary) hypertension: Secondary | ICD-10-CM | POA: Insufficient documentation

## 2014-12-15 DIAGNOSIS — F321 Major depressive disorder, single episode, moderate: Secondary | ICD-10-CM

## 2014-12-15 LAB — CBC WITH DIFFERENTIAL/PLATELET
BASOS PCT: 0 % (ref 0–1)
Basophils Absolute: 0 10*3/uL (ref 0.0–0.1)
EOS ABS: 0.1 10*3/uL (ref 0.0–0.7)
Eosinophils Relative: 2 % (ref 0–5)
HCT: 33.6 % — ABNORMAL LOW (ref 39.0–52.0)
Hemoglobin: 10.7 g/dL — ABNORMAL LOW (ref 13.0–17.0)
Lymphocytes Relative: 30 % (ref 12–46)
Lymphs Abs: 2.2 10*3/uL (ref 0.7–4.0)
MCH: 30.7 pg (ref 26.0–34.0)
MCHC: 31.8 g/dL (ref 30.0–36.0)
MCV: 96.6 fL (ref 78.0–100.0)
MONO ABS: 0.6 10*3/uL (ref 0.1–1.0)
Monocytes Relative: 8 % (ref 3–12)
Neutro Abs: 4.3 10*3/uL (ref 1.7–7.7)
Neutrophils Relative %: 60 % (ref 43–77)
Platelets: 124 10*3/uL — ABNORMAL LOW (ref 150–400)
RBC: 3.48 MIL/uL — ABNORMAL LOW (ref 4.22–5.81)
RDW: 17.1 % — AB (ref 11.5–15.5)
WBC: 7.3 10*3/uL (ref 4.0–10.5)

## 2014-12-15 LAB — CHROMOSOME ANALYSIS, BONE MARROW

## 2014-12-15 LAB — BASIC METABOLIC PANEL
Anion gap: 6 (ref 5–15)
BUN: 25 mg/dL — ABNORMAL HIGH (ref 6–20)
CALCIUM: 8.6 mg/dL — AB (ref 8.9–10.3)
CHLORIDE: 105 mmol/L (ref 101–111)
CO2: 24 mmol/L (ref 22–32)
CREATININE: 0.87 mg/dL (ref 0.61–1.24)
Glucose, Bld: 103 mg/dL — ABNORMAL HIGH (ref 65–99)
Potassium: 4.3 mmol/L (ref 3.5–5.1)
Sodium: 135 mmol/L (ref 135–145)

## 2014-12-15 LAB — I-STAT TROPONIN, ED: TROPONIN I, POC: 0 ng/mL (ref 0.00–0.08)

## 2014-12-15 MED ORDER — MORPHINE SULFATE 4 MG/ML IJ SOLN
4.0000 mg | Freq: Once | INTRAMUSCULAR | Status: AC
  Administered 2014-12-15: 4 mg via INTRAVENOUS
  Filled 2014-12-15: qty 1

## 2014-12-15 NOTE — ED Notes (Addendum)
Patient ate all his breakfast and drank 320.

## 2014-12-15 NOTE — ED Notes (Signed)
Spoke with [place] at [facility].  States that pt was in process (discharge papers were already signed) of being transferred to their sister facility on [address] yesterday when pt walked to store and called EMS.  Pt's belongings arent at [facility], they have already been transported to the other facility.  Marliss Coots states that she is currently on the phone with [facility] social worker, about this situation.  Informed her to finish speaking with Erasmo Downer and I would follow up with Erasmo Downer on pt's discharge instructions.

## 2014-12-15 NOTE — ED Notes (Signed)
[place] to give discharge report on pt going to their facility.  When spoke with manager, was informed that pt will need an FL-2 when he comes to the facility.

## 2014-12-15 NOTE — Consult Note (Signed)
[facility] Face-to-Face Psychiatry Consult   Reason for Consult:  Major depressive disorder, single episode moderate Referring Physician:  EDP Patient Identification: [name] MRN:  [id number] Principal Diagnosis: Major depressive disorder, single episode, moderate Diagnosis:   Patient Active Problem List   Diagnosis Date Noted  . DEPRESSION, MAJOR, MODERATE [F32.1] 12/24/2006    Priority: High  . 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 Atypical angina [I20.8] 12/01/2014  . Pressure ulcer [L89.90] 11/16/2014  . Marginal zone lymphoma [C85.80] 11/16/2014  . GI bleed [K92.2] 11/14/2014  . Chronic anticoagulation [Z79.01]   . Hx of colectomy [Z98.89]   . History of adenomatous polyp of colon [Z86.010]   . Rectal mass [R19.8]   . Near syncope [R55] 11/06/2014  . Fall [W19.XXXA]   . Aspiration pneumonia due to food (regurgitated) [J69.0]   . Streptococcal pneumonia [J15.4]   . Acute on chronic respiratory failure with hypoxia [J96.21]   . Essential hypertension [I10]   . Atrial fibrillation with RVR [I48.91]   . Hyponatremia [E87.1]   . Hypokalemia [E87.6]   . Rectal bleeding [K62.5]   . Retroperitoneal bleed [K66.1]   . Acute blood loss anemia [D62]   . HCAP (healthcare-associated pneumonia) [J18.9]   . Delirium [R41.0]   . Protein-calorie malnutrition, severe [E43] 10/08/2014  . Acute respiratory failure with hypoxia [J96.01] 10/07/2014  . Aspiration pneumonia [J69.0] 10/07/2014  . Septic shock [A41.9, R65.21] 10/07/2014  . Hematochezia [K92.1]   . Sigmoid & cecal bleeding colon masses s/p abdominal colectomy 10/05/2014 [D12.5]   . GIB (gastrointestinal bleeding) [K92.2] 09/30/2014  . HLD (hyperlipidemia) [E78.5] 09/30/2014  . Depression [F32.9] 09/30/2014  . Abdominal pain [R10.9] 09/30/2014  . BPH (benign prostatic hyperplasia) [N40.0] 09/29/2014  . Melena [K92.1]   . Closed comminuted intertrochanteric fracture of proximal end of right femur [S72.141A]  05/27/2013  . Fracture femur, cervicotrochanteric [S72.043A] 05/27/2013  . Protein-calorie malnutrition [E46] 12/24/2006  . Thrombocytopenia [D69.6] 12/24/2006  . PSYCHIATRIC DISORDER [F48.9] 12/24/2006  . LEARNING DISABILITY [F81.89] 12/24/2006  . 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 CARDIOMYOPATHY [I42.8] 12/24/2006  . 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 ATHEROSCLEROSIS, AORTIC [I70.0] 12/24/2006  . RENAL INSUFFICIENCY, ACUTE [N18.9] 12/24/2006  . Atrial fibrillation [I48.91] 12/24/2006  . HOMELESSNESS, HX OF [Z91.89] 12/24/2006  . CHOLECYSTECTOMY, HX OF [Z90.89] 12/24/2006    Total Time spent with patient: 1 hour  Subjective:   [name] is a 69 y.o. male patient admitted with Major depressive disorder, single episode moderate without Psychosis.  HPI:  [name], 69 years old was evaluated for mood disorder. Patient came from assisted living facility and stated he does not know why Psychiatrist was asked to see him.  He has a hx of depression and stated that his Depression is the same.  Patient stated that he came to the hospital for abdominal pain. Patient denied threatening to kill staff of the assisted living facility and stated that he has been with them for 15 years.   Patient stated that he has no problem with both staff and members of the assisted living facility.  Patient denies SI/HI/AVH.  Patient will like to go back to his ALF today and the facility have agreed to take him back.  Patient is discharged home to his assisted living facility.  HPI Elements:   Location:  Major depressive disorder, single episode without Psychosis. Quality:  moderate. Severity:  moderate. Timing:  Acute. Duration:  Sudden. Context:  Brought in for Abdominal pain .  Past Medical History:  Past Medical History  Diagnosis Date  . Depression   . Hypomagnesemia   . Fall at nursing home 05/27/2013    "slipped on water; said I broke my right hip" (05/27/2013)  .  Migraine     "used to have them bad; haven't had one in awhile" (05/27/2013)  . Gout attack     "not too long ago; in my right foot" (05/27/2013)  . Complete heart block     reason for pacemaker/notes 05/27/2013  . Atrial fibrillation     [name] 05/27/2013  . HLD (hyperlipidemia)   . GIB (gastrointestinal bleeding)   . BPH (benign prostatic hyperplasia)   . Presence of permanent cardiac pacemaker   . Hypertension     Past Surgical History  Procedure Laterality Date  . Insert / replace / remove pacemaker    . Appendectomy    . Cholecystectomy    . Open reduction of hip Right 05/28/2013    Procedure: OPEN REDUCTION OF HIP;  Surgeon: Renette Butters, MD;  Location: [place];  Service: Orthopedics;  Laterality: Right;  . Esophagogastroduodenoscopy (egd) with propofol N/A 09/26/2014    Procedure: ESOPHAGOGASTRODUODENOSCOPY (EGD) WITH PROPOFOL;  Surgeon: Jerene Bears, MD;  Location[facility] ENDOSCOPY;  Service: Endoscopy;  Laterality: N/A;  . Colonoscopy N/A 10/01/2014    Procedure: COLONOSCOPY;  Surgeon: Jerene Bears, MD;  Location: [facility] ENDOSCOPY;  Service: Endoscopy;  Laterality: N/A;  . Partial colectomy N/A 10/05/2014    Procedure: SUBTOTAL COLECTOMY WITH ILEORECTAL ANASTOMOSIS;  Surgeon: Donnie Mesa, MD;  Location: [place];  Service: General;  Laterality: N/A;  . Flexible sigmoidoscopy N/A 11/07/2014    Procedure: FLEXIBLE SIGMOIDOSCOPY;  Surgeon: Jerene Bears, MD;  Location: [facility] ENDOSCOPY;  Service: Endoscopy;  Laterality: N/A;   Family History:  Family History  Problem Relation Age of Onset  . Colon cancer Mother    Social History:  History  Alcohol Use No    Comment: 05/27/2013 "none in years; never had problem w/it" - not in 30 years     History  Drug Use No    History   Social History  . Marital Status: Divorced    Spouse Name: N/A  . Number of Children: N/A  . Years of Education: N/A   Social History Main Topics  . Smoking status: Current Some Day Smoker -- 0.50 packs/day  for 66 years    Types: Cigarettes  . Smokeless tobacco: Former Systems developer    Types: Snuff, Chew     Comment: 05/27/2013 "haven't used chew or snuff in ~ 30 yr"  . Alcohol Use: No     Comment: 05/27/2013 "none in years; never had problem w/it" - not in 30 years  . Drug Use: No  . Sexual Activity: No   Other Topics Concern  . None   Social History Narrative   Additional Social History:    Pain Medications: SEE MAR Prescriptions: SEE MAR Over the Counter: SEE MAR History of alcohol / drug use?: No history of alcohol / drug abuse                     Allergies:  No Known Allergies  Labs:  Results for orders placed or performed during the hospital encounter of 12/14/14 (from the past 48 hour(s))  CBC WITH DIFFERENTIAL     Status: Abnormal   Collection Time: 12/14/14 11:03 AM  Result Value Ref Range   WBC 5.1 4.0 - 10.5 K/uL  RBC 3.54 (L) 4.22 - 5.81 MIL/uL   Hemoglobin 10.9 (L) 13.0 - 17.0 g/dL   HCT 34.5 (L) 39.0 - 52.0 %   MCV 97.5 78.0 - 100.0 fL   MCH 30.8 26.0 - 34.0 pg   MCHC 31.6 30.0 - 36.0 g/dL   RDW 17.0 (H) 11.5 - 15.5 %   Platelets 148 (L) 150 - 400 K/uL   Neutrophils Relative % 52 43 - 77 %   Neutro Abs 2.6 1.7 - 7.7 K/uL   Lymphocytes Relative 40 12 - 46 %   Lymphs Abs 2.0 0.7 - 4.0 K/uL   Monocytes Relative 6 3 - 12 %   Monocytes Absolute 0.3 0.1 - 1.0 K/uL   Eosinophils Relative 2 0 - 5 %   Eosinophils Absolute 0.1 0.0 - 0.7 K/uL   Basophils Relative 0 0 - 1 %   Basophils Absolute 0.0 0.0 - 0.1 K/uL  Comprehensive metabolic panel     Status: Abnormal   Collection Time: 12/14/14 11:03 AM  Result Value Ref Range   Sodium 137 135 - 145 mmol/L   Potassium 3.9 3.5 - 5.1 mmol/L   Chloride 109 101 - 111 mmol/L   CO2 22 22 - 32 mmol/L   Glucose, Bld 133 (H) 65 - 99 mg/dL   BUN 20 6 - 20 mg/dL   Creatinine, Ser 0.67 0.61 - 1.24 mg/dL   Calcium 8.2 (L) 8.9 - 10.3 mg/dL   Total Protein 6.7 6.5 - 8.1 g/dL   Albumin 3.3 (L) 3.5 - 5.0 g/dL   AST 18 15 - 41  U/L   ALT 10 (L) 17 - 63 U/L   Alkaline Phosphatase 84 38 - 126 U/L   Total Bilirubin 0.2 (L) 0.3 - 1.2 mg/dL   GFR calc non Af Amer >60 >60 mL/min   GFR calc Af Amer >60 >60 mL/min    Comment: (NOTE) The eGFR has been calculated using the CKD EPI equation. This calculation has not been validated in all clinical situations. eGFR's persistently <60 mL/min signify possible Chronic Kidney Disease.    Anion gap 6 5 - 15  Lipase, blood     Status: Abnormal   Collection Time: 12/14/14 11:03 AM  Result Value Ref Range   Lipase 55 (H) 22 - 51 U/L  Ethanol     Status: None   Collection Time: 12/14/14 11:03 AM  Result Value Ref Range   Alcohol, Ethyl (B) <5 <5 mg/dL    Comment:        LOWEST DETECTABLE LIMIT FOR SERUM ALCOHOL IS 5 mg/dL FOR MEDICAL PURPOSES ONLY   Urinalysis, Routine w reflex microscopic (not at [facility])     Status: None   Collection Time: 12/14/14 12:09 PM  Result Value Ref Range   Color, Urine YELLOW YELLOW   APPearance CLEAR CLEAR   Specific Gravity, Urine 1.018 1.005 - 1.030   pH 5.5 5.0 - 8.0   Glucose, UA NEGATIVE NEGATIVE mg/dL   Hgb urine dipstick NEGATIVE NEGATIVE   Bilirubin Urine NEGATIVE NEGATIVE   Ketones, ur NEGATIVE NEGATIVE mg/dL   Protein, ur NEGATIVE NEGATIVE mg/dL   Urobilinogen, UA 0.2 0.0 - 1.0 mg/dL   Nitrite NEGATIVE NEGATIVE   Leukocytes, UA NEGATIVE NEGATIVE    Comment: MICROSCOPIC NOT DONE ON URINES WITH NEGATIVE PROTEIN, BLOOD, LEUKOCYTES, NITRITE, OR GLUCOSE <1000 mg/dL.  Urine rapid drug screen (hosp performed)     Status: Abnormal   Collection Time: 12/14/14 12:09 PM  Result Value Ref Range  Opiates POSITIVE (A) NONE DETECTED   Cocaine NONE DETECTED NONE DETECTED   Benzodiazepines NONE DETECTED NONE DETECTED   Amphetamines NONE DETECTED NONE DETECTED   Tetrahydrocannabinol NONE DETECTED NONE DETECTED   Barbiturates NONE DETECTED NONE DETECTED    Comment:        DRUG SCREEN FOR MEDICAL PURPOSES ONLY.  IF CONFIRMATION IS  NEEDED FOR ANY PURPOSE, NOTIFY LAB WITHIN 5 DAYS.        LOWEST DETECTABLE LIMITS FOR URINE DRUG SCREEN Drug Class       Cutoff (ng/mL) Amphetamine      1000 Barbiturate      200 Benzodiazepine   093 Tricyclics       235 Opiates          300 Cocaine          300 THC              50     Vitals: Blood pressure 125/88, pulse 81, temperature 97.4 F (36.3 C), temperature source Oral, resp. rate 16, SpO2 100 %.  Risk to Self: Suicidal Ideation: No Suicidal Intent: No Is patient at risk for suicide?: No Suicidal Plan?: No Access to Means: No What has been your use of drugs/alcohol within the last 12 months?:  (n/a) How many times?:  (n/a) Other Self Harm Risks:  (n/a) Triggers for Past Attempts: Other (Comment) (n/a) Intentional Self Injurious Behavior: None Risk to Others: Homicidal Ideation: No Thoughts of Harm to Others: No Current Homicidal Intent: No Current Homicidal Plan: No Access to Homicidal Means: No Identified Victim:  (n/a) History of harm to others?: No Assessment of Violence: None Noted Violent Behavior Description:  (patient is calm and cooperative ) Does patient have access to weapons?: No Criminal Charges Pending?: No Does patient have a court date: No Prior Inpatient Therapy: Prior Inpatient Therapy: No Prior Therapy Dates:  (n/a) Prior Therapy Facilty/Provider(s):  (n/a) Reason for Treatment:  (n/a) Prior Outpatient Therapy: Prior Outpatient Therapy: No Prior Therapy Dates:  (n/a) Prior Therapy Facilty/Provider(s):  (n/a) Reason for Treatment:  (n/a) Does patient have an ACCT team?: No Does patient have Intensive In-House Services?  : No Does patient have [facility] services? : No Does patient have P4CC services?: No  Current Facility-Administered Medications  Medication Dose Route Frequency Provider Last Rate Last Dose  . 0.9 %  sodium chloride infusion  1,000 mL Intravenous Continuous Dorie Rank, MD 125 mL/hr at 12/14/14 1131 1,000 mL at 12/14/14  1131  . acetaminophen (TYLENOL) tablet 1,000 mg  1,000 mg Oral Q6H PRN Dorie Rank, MD   1,000 mg at 12/15/14 0806  . calcium-vitamin D (OSCAL WITH D) 500-200 MG-UNIT per tablet 1 tablet  1 tablet Oral Daily Dorie Rank, MD   1 tablet at 12/15/14 0931  . carvedilol (COREG) tablet 6.25 mg  6.25 mg Oral BID WC Dorie Rank, MD   6.25 mg at 12/15/14 0806  . finasteride (PROSCAR) tablet 5 mg  5 mg Oral Daily Dorie Rank, MD   5 mg at 12/15/14 0931  . HYDROcodone-acetaminophen (NORCO/VICODIN) 5-325 MG per tablet 1 tablet  1 tablet Oral BID PRN Dorie Rank, MD   1 tablet at 12/15/14 0318  . loperamide (IMODIUM) capsule 2 mg  2 mg Oral QID PRN Dorie Rank, MD   2 mg at 12/15/14 0932  . magnesium oxide (MAG-OX) tablet 400 mg  400 mg Oral BID Dorie Rank, MD   400 mg at 12/15/14 0931  . saccharomyces boulardii (FLORASTOR) capsule 250  mg  250 mg Oral BID Dorie Rank, MD   250 mg at 12/15/14 0931  . sertraline (ZOLOFT) tablet 50 mg  50 mg Oral q morning - 10a Dorie Rank, MD   50 mg at 12/15/14 3662   Current Outpatient Prescriptions  Medication Sig Dispense Refill  . acetaminophen (TYLENOL) 500 MG tablet Take 1,000 mg by mouth every 6 (six) hours as needed for moderate pain.    . Calcium Carbonate-Vit D-Min (CALTRATE 600+D PLUS MINERALS) 600-800 MG-UNIT TABS Take 1 tablet by mouth daily.    . carvedilol (COREG) 6.25 MG tablet Take 1 tablet (6.25 mg total) by mouth 2 (two) times daily with a meal. 60 tablet 0  . finasteride (PROSCAR) 5 MG tablet Take 5 mg by mouth daily.    Marland Kitchen HYDROcodone-acetaminophen (NORCO/VICODIN) 5-325 MG per tablet Take 1 tablet by mouth 2 (two) times daily as needed for severe pain. 6 tablet 0  . loperamide (IMODIUM A-D) 2 MG tablet Take 2 mg by mouth 4 (four) times daily as needed for diarrhea or loose stools.    . magnesium oxide (MAG-OX) 400 MG tablet Take 400 mg by mouth 2 (two) times daily.    . pravastatin (PRAVACHOL) 10 MG tablet Take 10 mg by mouth at bedtime.    . sertraline (ZOLOFT) 50 MG  tablet Take 50 mg by mouth every morning.     . traMADol (ULTRAM) 50 MG tablet Take 50 mg by mouth every 6 (six) hours as needed for moderate pain.    . ciprofloxacin (CIPRO) 500 MG tablet Take 1 tablet (500 mg total) by mouth 2 (two) times daily. One po bid x 7 days (Patient not taking: Reported on 12/10/2014) 14 tablet 0  . Lactobacillus-Inulin ([place]) CAPS Take 1 capsule by mouth 3 (three) times daily. (Patient not taking: Reported on 12/12/2014) 90 capsule 0  . metroNIDAZOLE (FLAGYL) 500 MG tablet Take 1 tablet (500 mg total) by mouth 3 (three) times daily. One po bid x 7 days (Patient not taking: Reported on 12/10/2014) 21 tablet 0    Musculoskeletal: Strength & Muscle Tone: seen in bed Gait & Station: seen in bed Patient leans: see above.  Psychiatric Specialty Exam: Physical Exam  Review of Systems  Constitutional: Negative.   HENT: Negative.   Eyes: Negative.   Respiratory: Negative.   Cardiovascular: Negative.   Gastrointestinal:       Reports Chronic abdominal pain and previous colon surgery for removal of Polip  Genitourinary: Negative.   Musculoskeletal:       Chronic joint and back pain  Skin: Negative.   Neurological: Negative.   Endo/Heme/Allergies: Negative.     Blood pressure 125/88, pulse 81, temperature 97.4 F (36.3 C), temperature source Oral, resp. rate 16, SpO2 100 %.There is no weight on file to calculate BMI.  General Appearance: Casual  Eye Contact::  Good  Speech:  Clear and Coherent and Normal Rate  Volume:  Normal  Mood:  Depressed  Affect:  Congruent  Thought Process:  Coherent, Goal Directed and Intact  Orientation:  Full (Time, Place, and Person)  Thought Content:  WDL  Suicidal Thoughts:  No  Homicidal Thoughts:  No  Memory:  Immediate;   Good Recent;   Good Remote;   Good  Judgement:  Fair  Insight:  Fair  Psychomotor Activity:  Normal  Concentration:  Fair  Recall:  NA  Fund of Knowledge:Fair  Language: Good   Akathisia:  NA  Handed:  Right  AIMS (  if indicated):     Assets:  Desire for Improvement  ADL's:  Intact  Cognition: WNL  Sleep:      Medical Decision Making: Established Problem, Stable/Improving (1)   Disposition:  Discharge home to his assisted living facility  Delfin Gant   PMHNP-BC 12/15/2014 12:17 PM

## 2014-12-15 NOTE — ED Notes (Signed)
[name], Education officer, museum, awaiting a call from [employer] about pt's transportation to new facility.

## 2014-12-15 NOTE — ED Notes (Signed)
Pt in bathroom

## 2014-12-15 NOTE — ED Notes (Signed)
[facility] notified about pt transportation needed.

## 2014-12-15 NOTE — Progress Notes (Signed)
Pt psychiatrically stable for discharge back to [place]. Per discussion with psychiatrist and NP, recommending [facility] to make an appointment with neurologist to evaluate patient for dementia. CSW confirmed recommendations with Janina Mayo who verbalized understanding of disposition.CSW informed RN, who plans to call ptar for transportation back to ALF.    [name], [place] Work  [employer] [phone]

## 2014-12-15 NOTE — ED Notes (Signed)
Per [facility], pt seen here last night for same, CP. Ambulatory on scene and at ED. Pt has pacemaker, EKG by ems, shows PACs. Pt is AAOX4, in NAD. Given 324 asa, no nitro. Pt from adult enrichement center ([place])

## 2014-12-15 NOTE — ED Notes (Signed)
Pt is awake and alert, denies HI/SI reports multiple BM however reports that is normal, denies pain, mucuse or bleeding with BMs. Will continue to monitor for safey

## 2014-12-15 NOTE — [facility] Suicide Risk Assessment (Cosign Needed)
Suicide Risk Assessment  Discharge Assessment   [facility] Discharge Suicide Risk Assessment   Demographic Factors:  Male, Age 69 or older, Caucasian, Low socioeconomic status and Unemployed  Total Time spent with patient: 20 minutes  Musculoskeletal: Strength & Muscle Tone: seen in bed lying down Gait & Station: seen in bed lying down Patient leans: see above  Psychiatric Specialty Exam:     Blood pressure 124/56, pulse 82, temperature 97.7 F (36.5 C), temperature source Oral, resp. rate 17, SpO2 98 %.There is no weight on file to calculate BMI.  General Appearance: Casual  Eye Contact::  Good  Speech:  Clear and Coherent and Normal Rate  Volume:  Normal  Mood:  Depressed  Affect:  Congruent  Thought Process:  Coherent, Goal Directed and Intact  Orientation:  Full (Time, Place, and Person)  Thought Content:  WDL  Suicidal Thoughts:  No  Homicidal Thoughts:  No  Memory:  Immediate;   Good Recent;   Good Remote;   Good  Judgement:  Fair  Insight:  Fair  Psychomotor Activity:  Normal  Concentration:  Fair  Recall:  NA  Fund of Knowledge:Fair  Language: Good  Akathisia:  NA  Handed:  Right  AIMS (if indicated):     Assets:  Desire for Improvement  ADL's:  Intact  Cognition: WNL  Sleep:           Has this patient used any form of tobacco in the last 30 days? (Cigarettes, Smokeless Tobacco, Cigars, and/or Pipes) N/A  Mental Status Per Nursing Assessment::   On Admission:     Current Mental Status by Physician: NA  Loss Factors: NA  Historical Factors: NA  Risk Reduction Factors:   Religious beliefs about death, Living with another person, especially a relative and Positive social support  Continued Clinical Symptoms:  Depression:   Insomnia  Cognitive Features That Contribute To Risk:  Polarized thinking    Suicide Risk:  Minimal: No identifiable suicidal ideation.  Patients presenting with no risk factors but with morbid ruminations; may be classified  as minimal risk based on the severity of the depressive symptoms  Principal Problem: Major depressive disorder, single episode, moderate Discharge Diagnoses:  Patient Active Problem List   Diagnosis Date Noted  . DEPRESSION, MAJOR, MODERATE [F32.1] 12/24/2006    Priority: High  . 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 Atypical angina [I20.8] 12/01/2014  . Pressure ulcer [L89.90] 11/16/2014  . Marginal zone lymphoma [C85.80] 11/16/2014  . GI bleed [K92.2] 11/14/2014  . Chronic anticoagulation [Z79.01]   . Hx of colectomy [Z98.89]   . History of adenomatous polyp of colon [Z86.010]   . Rectal mass [R19.8]   . Near syncope [R55] 11/06/2014  . Fall [W19.XXXA]   . Aspiration pneumonia due to food (regurgitated) [J69.0]   . Streptococcal pneumonia [J15.4]   . Acute on chronic respiratory failure with hypoxia [J96.21]   . Essential hypertension [I10]   . Atrial fibrillation with RVR [I48.91]   . Hyponatremia [E87.1]   . Hypokalemia [E87.6]   . Rectal bleeding [K62.5]   . Retroperitoneal bleed [K66.1]   . Acute blood loss anemia [D62]   . HCAP (healthcare-associated pneumonia) [J18.9]   . Delirium [R41.0]   . Protein-calorie malnutrition, severe [E43] 10/08/2014  . Acute respiratory failure with hypoxia [J96.01] 10/07/2014  . Aspiration pneumonia [J69.0] 10/07/2014  . Septic shock [A41.9, R65.21] 10/07/2014  . Hematochezia [K92.1]   . Sigmoid & cecal bleeding colon masses s/p abdominal colectomy  10/05/2014 [D12.5]   . GIB (gastrointestinal bleeding) [K92.2] 09/30/2014  . HLD (hyperlipidemia) [E78.5] 09/30/2014  . Depression [F32.9] 09/30/2014  . Abdominal pain [R10.9] 09/30/2014  . BPH (benign prostatic hyperplasia) [N40.0] 09/29/2014  . Melena [K92.1]   . Closed comminuted intertrochanteric fracture of proximal end of right femur [S72.141A] 05/27/2013  . Fracture femur, cervicotrochanteric [S72.043A] 05/27/2013  . Protein-calorie malnutrition [E46] 12/24/2006  .  Thrombocytopenia [D69.6] 12/24/2006  . PSYCHIATRIC DISORDER [F48.9] 12/24/2006  . LEARNING DISABILITY [F81.89] 12/24/2006  . 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 CARDIOMYOPATHY [I42.8] 12/24/2006  . 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 ATHEROSCLEROSIS, AORTIC [I70.0] 12/24/2006  . RENAL INSUFFICIENCY, ACUTE [N18.9] 12/24/2006  . Atrial fibrillation [I48.91] 12/24/2006  . HOMELESSNESS, HX OF [Z91.89] 12/24/2006  . CHOLECYSTECTOMY, HX OF [Z90.89] 12/24/2006    Follow-up Information    Call [name], MD.   Specialty:  Family Medicine   Contact information:   [address] [place] [address] [phone]       Plan Of Care/Follow-up recommendations:  Activity:  as tolerated Diet:  Regular  Is patient on multiple antipsychotic therapies at discharge:  No   Has Patient had three or more failed trials of antipsychotic monotherapy by history:  No  Recommended Plan for Multiple Antipsychotic Therapies: NA    Landers Prajapati C   PMHNP-BC 12/15/2014, 12:36 PM

## 2014-12-16 DIAGNOSIS — R0789 Other chest pain: Secondary | ICD-10-CM | POA: Diagnosis not present

## 2014-12-16 LAB — I-STAT TROPONIN, ED: Troponin i, poc: 0 ng/mL (ref 0.00–0.08)

## 2014-12-16 MED ORDER — OXYCODONE-ACETAMINOPHEN 5-325 MG PO TABS
2.0000 | ORAL_TABLET | Freq: Once | ORAL | Status: AC
  Administered 2014-12-16: 2 via ORAL
  Filled 2014-12-16: qty 2

## 2014-12-16 NOTE — ED Notes (Signed)
PTAR called for Transport 

## 2014-12-16 NOTE — ED Provider Notes (Signed)
CSN: [id number]     Arrival date & time 12/15/14  2110 History   First MD Initiated Contact with Patient 12/15/14 2125     Chief Complaint  Patient presents with  . Chest Pain     (Consider location/radiation/quality/duration/timing/severity/associated sxs/prior Treatment) Patient is a 69 y.o. male presenting with chest pain. The history is provided by the patient. No language interpreter was used.  Chest Pain Pain location:  Substernal area Pain quality: dull and sharp   Pain radiates to:  Does not radiate Pain radiates to the back: no   Pain severity:  Moderate Onset quality:  Sudden Duration:  1 hour Timing:  Constant Progression:  Unchanged Chronicity:  New Context: at rest   Relieved by:  Nothing Worsened by:  Nothing tried Associated symptoms: no abdominal pain, no anorexia, no back pain, no cough, no diaphoresis, no dizziness, no dysphagia, no fatigue, no fever, no headache, no lower extremity edema, no nausea, no numbness, no shortness of breath, not vomiting and no weakness   Risk factors: high cholesterol, hypertension and male sex   Risk factors: no aortic disease and no coronary artery disease     Past Medical History  Diagnosis Date  . Depression   . Hypomagnesemia   . Fall at nursing home 05/27/2013    "slipped on water; said I broke my right hip" (05/27/2013)  . Migraine     "used to have them bad; haven't had one in awhile" (05/27/2013)  . Gout attack     "not too long ago; in my right foot" (05/27/2013)  . Complete heart block     reason for pacemaker/notes 05/27/2013  . Atrial fibrillation     [name] 05/27/2013  . HLD (hyperlipidemia)   . GIB (gastrointestinal bleeding)   . BPH (benign prostatic hyperplasia)   . Presence of permanent cardiac pacemaker   . Hypertension    Past Surgical History  Procedure Laterality Date  . Insert / replace / remove pacemaker    . Appendectomy    . Cholecystectomy    . Open reduction of hip Right 05/28/2013   Procedure: OPEN REDUCTION OF HIP;  Surgeon: Renette Butters, MD;  Location: [place];  Service: Orthopedics;  Laterality: Right;  . Esophagogastroduodenoscopy (egd) with propofol N/A 09/26/2014    Procedure: ESOPHAGOGASTRODUODENOSCOPY (EGD) WITH PROPOFOL;  Surgeon: Jerene Bears, MD;  Location: [facility] ENDOSCOPY;  Service: Endoscopy;  Laterality: N/A;  . Colonoscopy N/A 10/01/2014    Procedure: COLONOSCOPY;  Surgeon: Jerene Bears, MD;  Location: [facility] ENDOSCOPY;  Service: Endoscopy;  Laterality: N/A;  . Partial colectomy N/A 10/05/2014    Procedure: SUBTOTAL COLECTOMY WITH ILEORECTAL ANASTOMOSIS;  Surgeon: Donnie Mesa, MD;  Location: [place];  Service: General;  Laterality: N/A;  . Flexible sigmoidoscopy N/A 11/07/2014    Procedure: FLEXIBLE SIGMOIDOSCOPY;  Surgeon: Jerene Bears, MD;  Location: [facility] ENDOSCOPY;  Service: Endoscopy;  Laterality: N/A;   Family History  Problem Relation Age of Onset  . Colon cancer Mother    History  Substance Use Topics  . Smoking status: Current Some Day Smoker -- 0.50 packs/day for 66 years    Types: Cigarettes  . Smokeless tobacco: Former Systems developer    Types: Snuff, Chew     Comment: 05/27/2013 "haven't used chew or snuff in ~ 30 yr"  . Alcohol Use: No     Comment: 05/27/2013 "none in years; never had problem w/it" - not in 30 years    Review of Systems  Constitutional: Negative for  fever, diaphoresis, activity change, appetite change and fatigue.  HENT: Negative for congestion, facial swelling, rhinorrhea and trouble swallowing.   Eyes: Negative for photophobia and pain.  Respiratory: Negative for cough, chest tightness and shortness of breath.   Cardiovascular: Positive for chest pain. Negative for leg swelling.  Gastrointestinal: Negative for nausea, vomiting, abdominal pain, diarrhea, constipation and anorexia.  Endocrine: Negative for polydipsia and polyuria.  Genitourinary: Negative for dysuria, urgency, decreased urine volume and difficulty urinating.    Musculoskeletal: Negative for back pain and gait problem.  Skin: Negative for color change, rash and wound.  Allergic/Immunologic: Negative for immunocompromised state.  Neurological: Negative for dizziness, facial asymmetry, speech difficulty, weakness, numbness and headaches.  Psychiatric/Behavioral: Negative for confusion, decreased concentration and agitation.      Allergies  Review of patient's allergies indicates no known allergies.  Home Medications   Prior to Admission medications   Medication Sig Start Date End Date Taking? Authorizing Provider  Calcium Carbonate-Vit D-Min (CALTRATE 600+D PLUS MINERALS) 600-800 MG-UNIT TABS Take 1 tablet by mouth daily.    Historical Provider, MD  carvedilol (COREG) 6.25 MG tablet Take 1 tablet (6.25 mg total) by mouth 2 (two) times daily with a meal. 11/16/14   Velvet Bathe, MD  finasteride (PROSCAR) 5 MG tablet Take 5 mg by mouth daily.    Historical Provider, MD  HYDROcodone-acetaminophen (NORCO/VICODIN) 5-325 MG per tablet Take 1 tablet by mouth 2 (two) times daily as needed for severe pain. 12/05/14   Everlene Balls, MD  Lactobacillus-Inulin ([place]) CAPS Take 1 capsule by mouth 3 (three) times daily. Patient not taking: Reported on 12/12/2014 12/12/14   April Palumbo, MD  loperamide (IMODIUM A-D) 2 MG tablet Take 2 mg by mouth 4 (four) times daily as needed for diarrhea or loose stools.    Historical Provider, MD  magnesium oxide (MAG-OX) 400 MG tablet Take 400 mg by mouth 2 (two) times daily.    Historical Provider, MD  pravastatin (PRAVACHOL) 10 MG tablet Take 10 mg by mouth at bedtime.    Historical Provider, MD  sertraline (ZOLOFT) 50 MG tablet Take 50 mg by mouth every morning.     Historical Provider, MD  traMADol (ULTRAM) 50 MG tablet Take 50 mg by mouth every 6 (six) hours as needed for moderate pain.    Historical Provider, MD   BP 119/65 mmHg  Pulse 92  Temp(Src) 98.9 F (37.2 C) (Oral)  Resp 24  SpO2  100% Physical Exam  Constitutional: He is oriented to person, place, and time. He appears well-developed and well-nourished. No distress.  HENT:  Head: Normocephalic and atraumatic.  Mouth/Throat: No oropharyngeal exudate.  Eyes: Pupils are equal, round, and reactive to light.  Neck: Normal range of motion. Neck supple.  Cardiovascular: Normal rate, regular rhythm and normal heart sounds.  Exam reveals no gallop and no friction rub.   No murmur heard. Pulmonary/Chest: Effort normal and breath sounds normal. No respiratory distress. He has no wheezes. He has no rales.    Abdominal: Soft. Bowel sounds are normal. He exhibits no distension and no mass. There is no tenderness. There is no rebound and no guarding.  Musculoskeletal: Normal range of motion. He exhibits no edema or tenderness.  Neurological: He is alert and oriented to person, place, and time.  Skin: Skin is warm and dry.  Psychiatric: He has a normal mood and affect.    ED Course  Procedures (including critical care time) Labs Review Labs Reviewed  CBC WITH DIFFERENTIAL/PLATELET -  Abnormal; Notable for the following:    RBC 3.48 (*)    Hemoglobin 10.7 (*)    HCT 33.6 (*)    RDW 17.1 (*)    Platelets 124 (*)    All other components within normal limits  BASIC METABOLIC PANEL - Abnormal; Notable for the following:    Glucose, Bld 103 (*)    BUN 25 (*)    Calcium 8.6 (*)    All other components within normal limits  I-STAT TROPOININ, ED    Imaging Review Dg Chest 2 View  12/15/2014   CLINICAL DATA:  Chest pain, shortness of breath and cough today. History of hypertension, respiratory failure, septic shock.  EXAM: CHEST  2 VIEW  COMPARISON:  Chest radiograph December 13, 2013  FINDINGS: Cardiac silhouette is mildly enlarged, mediastinal silhouette is nonsuspicious, mildly calcified aortic knob. No pleural effusion or focal consolidation. No pneumothorax. Soft tissue planes and included osseous structure in nonacute; patient  is cachectic.  IMPRESSION: Mild cardiomegaly, no acute pulmonary process.   Electronically Signed   By: Elon Alas M.D.   On: 12/15/2014 22:35   Dg Abd Acute W/chest  12/14/2014   CLINICAL DATA:  Mid abdominal pain for 6 months. Diarrhea. Prior partial colectomy.  EXAM: DG ABDOMEN ACUTE W/ 1V CHEST  COMPARISON:  Chest radiographs 12/10/2014. CT abdomen and pelvis 11/29/2014.  FINDINGS: Dual lead pacemaker remains in place. Cardiac silhouette remains enlarged. Lungs remain hyperinflated. No airspace consolidation, edema, pleural effusion, or pneumothorax is identified. Focal sclerosis is again noted in the posterior right fourth rib.  There is no evidence of intraperitoneal free air. Gas is present in nondilated loops of small and large bowel without evidence of obstruction. Anastomotic staple line is noted in the pelvis. Right upper quadrant abdominal surgical clips are noted. Internal fixation of the right femur is partially visualized.  IMPRESSION: 1. No evidence of acute cardiopulmonary disease. 2. Nonobstructed bowel-gas pattern.   Electronically Signed   By: Logan Bores   On: 12/14/2014 11:31     EKG Interpretation None     ED ECG REPORT   Date: 12/15/2014  Rate: 87  Rhythm: atrial fibrillation  QRS Axis: right  Intervals:   ST/T Wave abnormalities: nonspecific ST changes  Conduction Disutrbances:none  Narrative Interpretation:   Old EKG Reviewed: unchanged  I have personally reviewed the EKG tracing and agree with the computerized printout as noted.   MDM   Final diagnoses:  Atypical chest pain    Pt is a 69 y.o. male with Pmhx as above who presents with sudden onset central sharp chest pain about a 40 5 PM while at rest.  Patient denies associated cough, fever, shortness of breath, radiation or diaphoresis.  Patient seen here yesterday for abdominal pain and states that this is not the same pain, he has had 18, ED visits in the past 6 months.  He reports having a left  heart cath approximately 5 years ago, has no stent placement.  He has a pacemaker that was placed possibly 10 years ago, for sick sinus syndrome, however, he has not been following with cardiology.  On physical exam, vital signs are stable and he is in no acute distress.  He is reproducible central chest pain.  Lungs are clear.  No lower extremity edema.  EKG shows A. fib which is unchanged from prior.  I do not see clear pacemaker spikes.  Chest x-rays, no acute pulmonary process.  First troponin is negative.  CBC and BMP  are grossly unremarkable.  I feel chest pain is likely not cardiogenic in nature, however, will get 3 hour troponin.  [employer] is also sending a rapid to interrogate pacemaker as it could not be done over the phone, as the devices , is too old to interrogate by phone.   Dr. Tammy Sours will f/u on 0130 trop, I feel can d/c home with outp cards f/u is nml.     Modena Jansky Hacker evaluation in the Emergency Department is complete. It has been determined that no acute conditions requiring further emergency intervention are present at this time. The patient/guardian have been advised of the diagnosis and plan. We have discussed signs and symptoms that warrant return to the ED, such as changes or worsening in symptoms, worsening pain, fever, inability to tolerate liquids.       Ernestina Patches, MD 12/16/14 [phone]

## 2014-12-16 NOTE — ED Notes (Signed)
Pt stable, ambulatory, states understanding of discharge instructions 

## 2014-12-16 NOTE — Discharge Instructions (Signed)

## 2014-12-21 ENCOUNTER — Encounter (HOSPITAL_COMMUNITY): Payer: Self-pay

## 2014-12-23 ENCOUNTER — Emergency Department (HOSPITAL_COMMUNITY): Payer: Medicare Other

## 2014-12-23 ENCOUNTER — Encounter (HOSPITAL_COMMUNITY): Payer: Self-pay | Admitting: Emergency Medicine

## 2014-12-23 ENCOUNTER — Emergency Department (HOSPITAL_COMMUNITY)
Admission: EM | Admit: 2014-12-23 | Discharge: 2014-12-23 | Disposition: A | Discharge status: Home / Self Care | Payer: Medicare Other | Attending: Emergency Medicine | Admitting: Emergency Medicine

## 2014-12-23 DIAGNOSIS — M25561 Pain in right knee: Secondary | ICD-10-CM | POA: Diagnosis not present

## 2014-12-23 DIAGNOSIS — G43909 Migraine, unspecified, not intractable, without status migrainosus: Secondary | ICD-10-CM | POA: Diagnosis not present

## 2014-12-23 DIAGNOSIS — F329 Major depressive disorder, single episode, unspecified: Secondary | ICD-10-CM | POA: Diagnosis not present

## 2014-12-23 DIAGNOSIS — Z79899 Other long term (current) drug therapy: Secondary | ICD-10-CM | POA: Insufficient documentation

## 2014-12-23 DIAGNOSIS — I10 Essential (primary) hypertension: Secondary | ICD-10-CM | POA: Insufficient documentation

## 2014-12-23 DIAGNOSIS — E785 Hyperlipidemia, unspecified: Secondary | ICD-10-CM | POA: Insufficient documentation

## 2014-12-23 DIAGNOSIS — Z720 Tobacco use: Secondary | ICD-10-CM | POA: Diagnosis not present

## 2014-12-23 DIAGNOSIS — Z950 Presence of cardiac pacemaker: Secondary | ICD-10-CM | POA: Diagnosis not present

## 2014-12-23 MED ORDER — ACETAMINOPHEN 325 MG PO TABS
650.0000 mg | ORAL_TABLET | Freq: Once | ORAL | Status: AC
  Administered 2014-12-23: 650 mg via ORAL
  Filled 2014-12-23: qty 2

## 2014-12-23 NOTE — ED Provider Notes (Signed)
CSN: [id number]     Arrival date & time 12/23/14  1822 History   First MD Initiated Contact with Patient 12/23/14 1906     Chief Complaint  Patient presents with  . Leg Pain    The patient said he has been having right leg pain for seven days now.  He called EMS because it has gotten worse.  He denies any injury.    Patient is a 69 y.o. male presenting with leg pain. The history is provided by the patient.  Leg Pain Location:  Knee Injury: no   Knee location:  R knee Pain details:    Quality:  Aching   Radiates to:  Does not radiate   Severity:  Moderate   Onset quality:  Gradual   Duration:  7 days   Timing:  Constant   Progression:  Worsening Chronicity:  Recurrent Dislocation: no   Foreign body present:  No foreign bodies Worsened by:  Activity Ineffective treatments:  None tried Associated symptoms: no back pain, no fever, no muscle weakness and no swelling    the patient has a history of frequent visits to the emergency department. The most daily this past week for various complaints. Patient states he has been having knee pain for a long period of time. He was told that his foot to have a knee replacement in the past. However, when asked the patient about having x-rays of his knee, he tells me he hasn't had x-rays in the past.  Past Medical History  Diagnosis Date  . Depression   . Hypomagnesemia   . Fall at nursing home 05/27/2013    "slipped on water; said I broke my right hip" (05/27/2013)  . Migraine     "used to have them bad; haven't had one in awhile" (05/27/2013)  . Gout attack     "not too long ago; in my right foot" (05/27/2013)  . Complete heart block     reason for pacemaker/notes 05/27/2013  . Atrial fibrillation     [name] 05/27/2013  . HLD (hyperlipidemia)   . GIB (gastrointestinal bleeding)   . BPH (benign prostatic hyperplasia)   . Presence of permanent cardiac pacemaker   . Hypertension    Past Surgical History  Procedure Laterality Date  .  Insert / replace / remove pacemaker    . Appendectomy    . Cholecystectomy    . Open reduction of hip Right 05/28/2013    Procedure: OPEN REDUCTION OF HIP;  Surgeon: Renette Butters, MD;  Location: [place];  Service: Orthopedics;  Laterality: Right;  . Esophagogastroduodenoscopy (egd) with propofol N/A 09/26/2014    Procedure: ESOPHAGOGASTRODUODENOSCOPY (EGD) WITH PROPOFOL;  Surgeon: Jerene Bears, MD;  Location: [facility] ENDOSCOPY;  Service: Endoscopy;  Laterality: N/A;  . Colonoscopy N/A 10/01/2014    Procedure: COLONOSCOPY;  Surgeon: Jerene Bears, MD;  Location: [facility] ENDOSCOPY;  Service: Endoscopy;  Laterality: N/A;  . Partial colectomy N/A 10/05/2014    Procedure: SUBTOTAL COLECTOMY WITH ILEORECTAL ANASTOMOSIS;  Surgeon: Donnie Mesa, MD;  Location: [place];  Service: General;  Laterality: N/A;  . Flexible sigmoidoscopy N/A 11/07/2014    Procedure: FLEXIBLE SIGMOIDOSCOPY;  Surgeon: Jerene Bears, MD;  Location: [facility] ENDOSCOPY;  Service: Endoscopy;  Laterality: N/A;   Family History  Problem Relation Age of Onset  . Colon cancer Mother    History  Substance Use Topics  . Smoking status: Current Some Day Smoker -- 0.50 packs/day for 66 years    Types: Cigarettes  .  Smokeless tobacco: Former Systems developer    Types: Snuff, Chew     Comment: 05/27/2013 "haven't used chew or snuff in ~ 30 yr"  . Alcohol Use: No     Comment: 05/27/2013 "none in years; never had problem w/it" - not in 30 years    Review of Systems  Constitutional: Negative for fever.  Musculoskeletal: Negative for back pain.  All other systems reviewed and are negative.     Allergies  Review of patient's allergies indicates no known allergies.  Home Medications   Prior to Admission medications   Medication Sig Start Date End Date Taking? Authorizing Provider  Calcium Carbonate-Vit D-Min (CALTRATE 600+D PLUS MINERALS) 600-800 MG-UNIT TABS Take 1 tablet by mouth daily.    Historical Provider, MD  carvedilol (COREG) 6.25 MG tablet Take 1  tablet (6.25 mg total) by mouth 2 (two) times daily with a meal. 11/16/14   Velvet Bathe, MD  finasteride (PROSCAR) 5 MG tablet Take 5 mg by mouth daily.    Historical Provider, MD  HYDROcodone-acetaminophen (NORCO/VICODIN) 5-325 MG per tablet Take 1 tablet by mouth 2 (two) times daily as needed for severe pain. 12/05/14   Everlene Balls, MD  Lactobacillus-Inulin ([place]) CAPS Take 1 capsule by mouth 3 (three) times daily. Patient not taking: Reported on 12/12/2014 12/12/14   April Palumbo, MD  loperamide (IMODIUM A-D) 2 MG tablet Take 2 mg by mouth 4 (four) times daily as needed for diarrhea or loose stools.    Historical Provider, MD  magnesium oxide (MAG-OX) 400 MG tablet Take 400 mg by mouth 2 (two) times daily.    Historical Provider, MD  pravastatin (PRAVACHOL) 10 MG tablet Take 10 mg by mouth at bedtime.    Historical Provider, MD  sertraline (ZOLOFT) 50 MG tablet Take 50 mg by mouth every morning.     Historical Provider, MD  traMADol (ULTRAM) 50 MG tablet Take 50 mg by mouth every 6 (six) hours as needed for moderate pain.    Historical Provider, MD   BP 123/70 mmHg  Pulse 81  Temp(Src) 98.2 F (36.8 C) (Oral)  Resp 16  Ht 5\' 9"  (1.753 m)  Wt 70 lb (31.752 kg)  BMI 10.33 kg/m2  SpO2 99% Physical Exam  Constitutional: He appears well-developed and well-nourished. No distress.  HENT:  Head: Normocephalic and atraumatic.  Right Ear: External ear normal.  Left Ear: External ear normal.  Eyes: Conjunctivae are normal. Right eye exhibits no discharge. Left eye exhibits no discharge. No scleral icterus.  Neck: Neck supple. No tracheal deviation present.  Cardiovascular: Normal rate.   Pulmonary/Chest: Effort normal. No stridor. No respiratory distress.  Musculoskeletal: He exhibits tenderness. He exhibits no edema.       Right knee: He exhibits normal range of motion, no swelling, no effusion, no ecchymosis and no deformity. Tenderness found.       Left knee: Normal.   Neurological: He is alert. Cranial nerve deficit: no gross deficits.  Skin: Skin is warm and dry. No rash noted.  Psychiatric: He has a normal mood and affect.  Nursing note and vitals reviewed.   ED Course  Procedures (including critical care time) Labs Review Labs Reviewed - No data to display  Imaging Review Dg Knee Complete 4 Views Right  12/23/2014   CLINICAL DATA:  Right knee pain  EXAM: RIGHT KNEE - COMPLETE 4+ VIEW  COMPARISON:  05/28/2013.  FINDINGS: Previous hardware fixation of the proximal right femur. The bones appear osteopenic. There is no  evidence for acute fracture or subluxation. No joint effusion identified. Eighty loose body is noted within the supra patellar joint space.  IMPRESSION: 1. No acute findings. 2. Previous hardware fixation of the proximal femur.   Electronically Signed   By: Kerby Moors M.D.   On: 12/23/2014 20:12     EKG Interpretation None      MDM   Final diagnoses:  Right knee pain    No acute findings on xray.  No sign of infection.  No acute injury. Follow up ortho prn    Dorie Rank, MD 12/23/14 2041

## 2014-12-23 NOTE — ED Notes (Addendum)
Pt states he was "supposed to have new knees put in last week," states that he was unable to. Reports knee pain x7 years that has gotten worse today. No known injuries. Pt reports he takes darvicet for pain, took one this morning and his pain decreased, did not take another one because the people who take care of him at [facility] would not give him another one.

## 2014-12-23 NOTE — ED Notes (Signed)
MD at bedside. 

## 2014-12-23 NOTE — ED Notes (Addendum)
The patient said he has been having right leg pain for seven days now.  He called EMS because it has gotten worse.  He denies any injury.  The patient rates his pain 8/10.  Vitals sings 150/76, HR 78, RR 18.

## 2014-12-23 NOTE — ED Notes (Signed)
Patient transported to X-ray 

## 2014-12-23 NOTE — ED Notes (Signed)
Patient decline crutches

## 2014-12-26 ENCOUNTER — Emergency Department (HOSPITAL_COMMUNITY): Payer: Medicare Other

## 2014-12-26 ENCOUNTER — Encounter (HOSPITAL_COMMUNITY): Payer: Self-pay | Admitting: Emergency Medicine

## 2014-12-26 ENCOUNTER — Emergency Department (HOSPITAL_COMMUNITY)
Admission: EM | Admit: 2014-12-26 | Discharge: 2014-12-27 | Disposition: A | Discharge status: Home / Self Care | Payer: Medicare Other | Attending: Emergency Medicine | Admitting: Emergency Medicine

## 2014-12-26 ENCOUNTER — Emergency Department (HOSPITAL_COMMUNITY)
Admission: EM | Admit: 2014-12-26 | Discharge: 2014-12-26 | Disposition: A | Discharge status: Home / Self Care | Payer: Medicare Other | Attending: Emergency Medicine | Admitting: Emergency Medicine

## 2014-12-26 DIAGNOSIS — R197 Diarrhea, unspecified: Secondary | ICD-10-CM | POA: Insufficient documentation

## 2014-12-26 DIAGNOSIS — I10 Essential (primary) hypertension: Secondary | ICD-10-CM | POA: Insufficient documentation

## 2014-12-26 DIAGNOSIS — Z950 Presence of cardiac pacemaker: Secondary | ICD-10-CM | POA: Insufficient documentation

## 2014-12-26 DIAGNOSIS — R079 Chest pain, unspecified: Secondary | ICD-10-CM | POA: Diagnosis present

## 2014-12-26 DIAGNOSIS — Z87438 Personal history of other diseases of male genital organs: Secondary | ICD-10-CM | POA: Diagnosis not present

## 2014-12-26 DIAGNOSIS — K625 Hemorrhage of anus and rectum: Secondary | ICD-10-CM | POA: Diagnosis present

## 2014-12-26 DIAGNOSIS — N400 Enlarged prostate without lower urinary tract symptoms: Secondary | ICD-10-CM | POA: Insufficient documentation

## 2014-12-26 DIAGNOSIS — K921 Melena: Secondary | ICD-10-CM | POA: Insufficient documentation

## 2014-12-26 DIAGNOSIS — I4891 Unspecified atrial fibrillation: Secondary | ICD-10-CM | POA: Insufficient documentation

## 2014-12-26 DIAGNOSIS — Z79899 Other long term (current) drug therapy: Secondary | ICD-10-CM | POA: Insufficient documentation

## 2014-12-26 DIAGNOSIS — Z8739 Personal history of other diseases of the musculoskeletal system and connective tissue: Secondary | ICD-10-CM | POA: Diagnosis not present

## 2014-12-26 DIAGNOSIS — S3991XA Unspecified injury of abdomen, initial encounter: Secondary | ICD-10-CM | POA: Diagnosis not present

## 2014-12-26 DIAGNOSIS — Z720 Tobacco use: Secondary | ICD-10-CM | POA: Insufficient documentation

## 2014-12-26 DIAGNOSIS — R0789 Other chest pain: Secondary | ICD-10-CM | POA: Diagnosis not present

## 2014-12-26 DIAGNOSIS — E785 Hyperlipidemia, unspecified: Secondary | ICD-10-CM | POA: Diagnosis not present

## 2014-12-26 DIAGNOSIS — Z8659 Personal history of other mental and behavioral disorders: Secondary | ICD-10-CM | POA: Insufficient documentation

## 2014-12-26 DIAGNOSIS — F329 Major depressive disorder, single episode, unspecified: Secondary | ICD-10-CM | POA: Diagnosis not present

## 2014-12-26 LAB — BASIC METABOLIC PANEL
ANION GAP: 9 (ref 5–15)
BUN: 22 mg/dL — ABNORMAL HIGH (ref 6–20)
CALCIUM: 9.1 mg/dL (ref 8.9–10.3)
CO2: 25 mmol/L (ref 22–32)
Chloride: 106 mmol/L (ref 101–111)
Creatinine, Ser: 0.83 mg/dL (ref 0.61–1.24)
GLUCOSE: 101 mg/dL — AB (ref 65–99)
Potassium: 4.4 mmol/L (ref 3.5–5.1)
Sodium: 140 mmol/L (ref 135–145)

## 2014-12-26 LAB — CBC WITH DIFFERENTIAL/PLATELET
BASOS PCT: 0 % (ref 0–1)
Basophils Absolute: 0 10*3/uL (ref 0.0–0.1)
Eosinophils Absolute: 0.1 10*3/uL (ref 0.0–0.7)
Eosinophils Relative: 2 % (ref 0–5)
HCT: 33.4 % — ABNORMAL LOW (ref 39.0–52.0)
Hemoglobin: 10.4 g/dL — ABNORMAL LOW (ref 13.0–17.0)
LYMPHS PCT: 36 % (ref 12–46)
Lymphs Abs: 2.4 10*3/uL (ref 0.7–4.0)
MCH: 30.2 pg (ref 26.0–34.0)
MCHC: 31.1 g/dL (ref 30.0–36.0)
MCV: 97.1 fL (ref 78.0–100.0)
MONO ABS: 0.6 10*3/uL (ref 0.1–1.0)
Monocytes Relative: 9 % (ref 3–12)
NEUTROS PCT: 53 % (ref 43–77)
Neutro Abs: 3.6 10*3/uL (ref 1.7–7.7)
PLATELETS: 161 10*3/uL (ref 150–400)
RBC: 3.44 MIL/uL — ABNORMAL LOW (ref 4.22–5.81)
RDW: 16.9 % — ABNORMAL HIGH (ref 11.5–15.5)
WBC: 6.8 10*3/uL (ref 4.0–10.5)

## 2014-12-26 LAB — CBC
HCT: 33 % — ABNORMAL LOW (ref 39.0–52.0)
Hemoglobin: 10.3 g/dL — ABNORMAL LOW (ref 13.0–17.0)
MCH: 30.4 pg (ref 26.0–34.0)
MCHC: 31.2 g/dL (ref 30.0–36.0)
MCV: 97.3 fL (ref 78.0–100.0)
Platelets: 144 10*3/uL — ABNORMAL LOW (ref 150–400)
RBC: 3.39 MIL/uL — AB (ref 4.22–5.81)
RDW: 16.7 % — ABNORMAL HIGH (ref 11.5–15.5)
WBC: 7.3 10*3/uL (ref 4.0–10.5)

## 2014-12-26 LAB — PROTIME-INR
INR: 1.07 (ref 0.00–1.49)
Prothrombin Time: 14.1 seconds (ref 11.6–15.2)

## 2014-12-26 LAB — I-STAT TROPONIN, ED: Troponin i, poc: 0 ng/mL (ref 0.00–0.08)

## 2014-12-26 LAB — POC OCCULT BLOOD, ED: Fecal Occult Bld: POSITIVE — AB

## 2014-12-26 MED ORDER — ACETAMINOPHEN 325 MG PO TABS
650.0000 mg | ORAL_TABLET | Freq: Once | ORAL | Status: AC
  Administered 2014-12-26: 650 mg via ORAL
  Filled 2014-12-26: qty 2

## 2014-12-26 NOTE — ED Notes (Signed)
PTAR CALLED @0216 .

## 2014-12-26 NOTE — ED Notes (Signed)
Per ems-- pt c/o sharp non radiating cp since this am. Denies sob, nv. Pt has pacemaker. ekg- irregular rhythm. Ems administered 324 asa and 2 nitro with out relief.

## 2014-12-26 NOTE — ED Notes (Signed)
MD at bedside. 

## 2014-12-26 NOTE — ED Notes (Addendum)
Pt arrives via EMS from SNF with rectal bleeding. Has been seen for the same in the past. Bright red blood per patient. Ambulatory. Not using RXed PRN meds, c/o abdominal pain in addition to rectal bleeding. ASking for coke in triage.

## 2014-12-26 NOTE — ED Provider Notes (Signed)
CSN: [id number]     Arrival date & time 12/26/14  2116 History   First MD Initiated Contact with Patient 12/26/14 2122     Chief Complaint  Patient presents with  . Chest Pain   (Consider location/radiation/quality/duration/timing/severity/associated sxs/prior Treatment) Patient is a 69 y.o. male presenting with chest pain. The history is provided by the patient. No language interpreter was used.  Chest Pain Pain location:  L chest Pain quality: sharp   Pain radiates to:  Does not radiate Pain radiates to the back: no   Pain severity:  Moderate Onset quality:  Sudden Timing:  Constant Progression:  Unchanged Chronicity:  Recurrent Context: at rest   Relieved by:  Nothing Worsened by:  Nothing tried Ineffective treatments: tylenol. Associated symptoms: no abdominal pain, no altered mental status, no anorexia, no anxiety, no claudication, no cough, no diaphoresis, no fatigue, no fever, no headache, no nausea, no palpitations, no shortness of breath, not vomiting and no weakness   Risk factors: hypertension, male sex and smoking     Past Medical History  Diagnosis Date  . Depression   . Hypomagnesemia   . Fall at nursing home 05/27/2013    "slipped on water; said I broke my right hip" (05/27/2013)  . Migraine     "used to have them bad; haven't had one in awhile" (05/27/2013)  . Gout attack     "not too long ago; in my right foot" (05/27/2013)  . Complete heart block     reason for pacemaker/notes 05/27/2013  . Atrial fibrillation     [name] 05/27/2013  . HLD (hyperlipidemia)   . GIB (gastrointestinal bleeding)   . BPH (benign prostatic hyperplasia)   . Presence of permanent cardiac pacemaker   . Hypertension    Past Surgical History  Procedure Laterality Date  . Insert / replace / remove pacemaker    . Appendectomy    . Cholecystectomy    . Open reduction of hip Right 05/28/2013    Procedure: OPEN REDUCTION OF HIP;  Surgeon: Renette Butters, MD;  Location: [place];   Service: Orthopedics;  Laterality: Right;  . Esophagogastroduodenoscopy (egd) with propofol N/A 09/26/2014    Procedure: ESOPHAGOGASTRODUODENOSCOPY (EGD) WITH PROPOFOL;  Surgeon: Jerene Bears, MD;  Location: [facility] ENDOSCOPY;  Service: Endoscopy;  Laterality: N/A;  . Colonoscopy N/A 10/01/2014    Procedure: COLONOSCOPY;  Surgeon: Jerene Bears, MD;  Location: [facility] ENDOSCOPY;  Service: Endoscopy;  Laterality: N/A;  . Partial colectomy N/A 10/05/2014    Procedure: SUBTOTAL COLECTOMY WITH ILEORECTAL ANASTOMOSIS;  Surgeon: Donnie Mesa, MD;  Location: [place];  Service: General;  Laterality: N/A;  . Flexible sigmoidoscopy N/A 11/07/2014    Procedure: FLEXIBLE SIGMOIDOSCOPY;  Surgeon: Jerene Bears, MD;  Location: [facility] ENDOSCOPY;  Service: Endoscopy;  Laterality: N/A;   Family History  Problem Relation Age of Onset  . Colon cancer Mother    History  Substance Use Topics  . Smoking status: Current Some Day Smoker -- 0.50 packs/day for 66 years    Types: Cigarettes  . Smokeless tobacco: Former Systems developer    Types: Snuff, Chew     Comment: 05/27/2013 "haven't used chew or snuff in ~ 30 yr"  . Alcohol Use: No     Comment: 05/27/2013 "none in years; never had problem w/it" - not in 30 years    Review of Systems  Constitutional: Negative for fever, diaphoresis and fatigue.  Respiratory: Negative for cough, chest tightness and shortness of breath.   Cardiovascular: Positive  for chest pain. Negative for palpitations and claudication.  Gastrointestinal: Positive for blood in stool. Negative for nausea, vomiting, abdominal pain and anorexia.  Neurological: Negative for weakness, light-headedness and headaches.  Psychiatric/Behavioral: Negative for confusion.  All other systems reviewed and are negative.     Allergies  Review of patient's allergies indicates no known allergies.  Home Medications   Prior to Admission medications   Medication Sig Start Date End Date Taking? Authorizing Provider  Calcium  Carbonate-Vit D-Min (CALTRATE 600+D PLUS MINERALS) 600-800 MG-UNIT TABS Take 1 tablet by mouth daily.    Historical Provider, MD  carvedilol (COREG) 6.25 MG tablet Take 1 tablet (6.25 mg total) by mouth 2 (two) times daily with a meal. 11/16/14   Velvet Bathe, MD  finasteride (PROSCAR) 5 MG tablet Take 5 mg by mouth daily.    Historical Provider, MD  HYDROcodone-acetaminophen (NORCO/VICODIN) 5-325 MG per tablet Take 1 tablet by mouth 2 (two) times daily as needed for severe pain. 12/05/14   Everlene Balls, MD  Lactobacillus-Inulin ([place]) CAPS Take 1 capsule by mouth 3 (three) times daily. Patient not taking: Reported on 12/12/2014 12/12/14   April Palumbo, MD  loperamide (IMODIUM A-D) 2 MG tablet Take 2 mg by mouth 4 (four) times daily as needed for diarrhea or loose stools.    Historical Provider, MD  magnesium oxide (MAG-OX) 400 MG tablet Take 400 mg by mouth 2 (two) times daily.    Historical Provider, MD  pravastatin (PRAVACHOL) 10 MG tablet Take 10 mg by mouth at bedtime.    Historical Provider, MD  sertraline (ZOLOFT) 50 MG tablet Take 50 mg by mouth every morning.     Historical Provider, MD  traMADol (ULTRAM) 50 MG tablet Take 50 mg by mouth every 6 (six) hours as needed for moderate pain.    Historical Provider, MD   BP 134/67 mmHg  Pulse 80  Temp(Src) 98 F (36.7 C) (Oral)  Resp 22  Ht 5\' 9"  (1.753 m)  SpO2 100%   Physical Exam  Constitutional: He is oriented to person, place, and time. Vital signs are normal. He appears well-developed and well-nourished. He does not appear ill. No distress.  Well appearing, in NAD  HENT:  Head: Normocephalic and atraumatic.  Nose: Nose normal.  Mouth/Throat: Oropharynx is clear and moist. No oropharyngeal exudate.  Edentulous   Eyes: EOM are normal. Pupils are equal, round, and reactive to light.  Neck: Normal range of motion. Neck supple.  Cardiovascular: Normal rate, regular rhythm, normal heart sounds and intact distal pulses.    No murmur heard. Pulmonary/Chest: Effort normal and breath sounds normal. No respiratory distress. He has no wheezes. He exhibits tenderness (pinpoint tenderness to palpation of left anterior chest with hand but not with palpation by stethoscope).  Abdominal: Soft. He exhibits no distension. There is no tenderness. There is no guarding.  Well healed surgical scar on abdomen  Musculoskeletal: Normal range of motion. He exhibits no tenderness.  Neurological: He is alert and oriented to person, place, and time. No cranial nerve deficit. Coordination normal.  Skin: Skin is warm and dry. He is not diaphoretic. No pallor.  Psychiatric: He has a normal mood and affect. His behavior is normal. Judgment and thought content normal.  Nursing note and vitals reviewed.   ED Course  Procedures (including critical care time) Labs Review Labs Reviewed  CBC WITH DIFFERENTIAL/PLATELET - Abnormal; Notable for the following:    RBC 3.44 (*)    Hemoglobin 10.4 (*)  HCT 33.4 (*)    RDW 16.9 (*)    All other components within normal limits  BASIC METABOLIC PANEL - Abnormal; Notable for the following:    Glucose, Bld 101 (*)    BUN 22 (*)    All other components within normal limits  PROTIME-INR  I-STAT TROPOININ, ED  Randolm Idol, ED   Imaging Review Dg Chest 2 View  12/26/2014   CLINICAL DATA:  Chest pain, shortness of breath and cough.  EXAM: CHEST  2 VIEW  COMPARISON:  12/15/2014  FINDINGS: Right-sided pacemaker unchanged. Lungs are adequately inflated without consolidation or effusion. Mild cardiomegaly unchanged. Calcified plaque over the thoracic aorta. Mild degenerative change of the spine.  IMPRESSION: No active cardiopulmonary disease.   Electronically Signed   By: Marin Olp M.D.   On: 12/26/2014 23:17     EKG Interpretation None      MDM   Final diagnoses:  Chest wall pain   Pt is a 69 yo M with hx of AFib, HTN, heart block w/ pacer, depression, and chronic GI bleeding who  presents with chest pain.  Complains of sharp left sided chest pain this AM.  Reports his ICD shocked him around 0700 and since has had the pain.  No improvement with tylenol.  No associated SOB, diaphoresis, lightheadedness, N/V.  Currently a 7/10 pain.   Epic review shows he has had 21 ED visits in the past 6 months, including one this AM.  Usually or GI bleeding vs chest pain.  Pt has had extensive work up in the past.  Most recently seen with chest pain on 7/8 when he had his cardiac biomarkers trended then discharged home.    Patient appears very well.  He has no po pain when palpating with my stethoscope on his left anterior chest but complains of significant pain when palpating with my hand in the same place.  Lungs are clear.   Doubt acute emergent pathology based on benign exam.  Given tylenol.   Will evaluate with EKG, CXR, and labs.   RN spoke to [facility] as previous note states that his pacer was placed in 2008 which is too old to interrogate via satellite.  They informed us that the device is strictly a pacer and not a defibrillator.  So pain could not be due to a discharge this AM.   CXR clear.  Troponin 0.00.  Electrolytes, kidney function, and hemoglobin.  Patient was tolerating PO intake, ambulating easily in the hall way, and in no acute distress.  Likely mild musculoskeletal chest wall pain.  Considered stable to discharge home.  Advised tylenol PRN for pain.  Encouraged to f/u with PCP if sx continue.  All questions were answered and he was discharged in stable condition.    If performed, labs, EKGs, and imaging were reviewed and interpreted by myself and my attending, and incorporated in the medical decision making.  Patient was seen with ED Attending, Dr. Shella Spearing, MD   Tori Milks, MD 12/27/14 5809  Charlesetta Shanks, MD 12/30/14 [phone]

## 2014-12-26 NOTE — ED Notes (Signed)
Spoke with [facility] and respresenitive sts that pt has a Single chamber pacemaker, therefore it could not shock pt as he described to MD.

## 2014-12-26 NOTE — Discharge Instructions (Signed)

## 2014-12-26 NOTE — ED Provider Notes (Signed)
CSN: [id number]     Arrival date & time 12/26/14  0120 History   This chart was scribed for Linton Flemings, MD by Randa Evens, ED Scribe. This patient was seen in room A01C/A01C and the patient's care was started at 1:25 AM.      Chief Complaint  Patient presents with  . Rectal Bleeding    The history is provided by the patient. No language interpreter was used.   HPI Comments: [name] is a 69 y.o. male brought in by ambulance, who presents to the Emergency Department complaining of rectal bleeding onset today. Pt states that he notices dark red blood every time he as a BM. Pt states that he has had associated abdominal pain. Pt reports chronic diarrhea. Pt reports colonoscopy 2 years prior. Pt also report Hx of colectomy. Pt states that he has had similar symptoms in May 2016. Pt states that he recently followed up with gastroenterology for his symptoms and was told to follow up with surgery. Pt states that he was suppose to follow up with the surgeon but has yet to do so. Pt denies nausea or vomiting.     Past Medical History  Diagnosis Date  . Depression   . Hypomagnesemia   . Fall at nursing home 05/27/2013    "slipped on water; said I broke my right hip" (05/27/2013)  . Migraine     "used to have them bad; haven't had one in awhile" (05/27/2013)  . Gout attack     "not too long ago; in my right foot" (05/27/2013)  . Complete heart block     reason for pacemaker/notes 05/27/2013  . Atrial fibrillation     [name] 05/27/2013  . HLD (hyperlipidemia)   . GIB (gastrointestinal bleeding)   . BPH (benign prostatic hyperplasia)   . Presence of permanent cardiac pacemaker   . Hypertension    Past Surgical History  Procedure Laterality Date  . Insert / replace / remove pacemaker    . Appendectomy    . Cholecystectomy    . Open reduction of hip Right 05/28/2013    Procedure: OPEN REDUCTION OF HIP;  Surgeon: Renette Butters, MD;  Location: [place];  Service: Orthopedics;   Laterality: Right;  . Esophagogastroduodenoscopy (egd) with propofol N/A 09/26/2014    Procedure: ESOPHAGOGASTRODUODENOSCOPY (EGD) WITH PROPOFOL;  Surgeon: Jerene Bears, MD;  Location: [facility] ENDOSCOPY;  Service: Endoscopy;  Laterality: N/A;  . Colonoscopy N/A 10/01/2014    Procedure: COLONOSCOPY;  Surgeon: Jerene Bears, MD;  Location: [facility] ENDOSCOPY;  Service: Endoscopy;  Laterality: N/A;  . Partial colectomy N/A 10/05/2014    Procedure: SUBTOTAL COLECTOMY WITH ILEORECTAL ANASTOMOSIS;  Surgeon: Donnie Mesa, MD;  Location: [place];  Service: General;  Laterality: N/A;  . Flexible sigmoidoscopy N/A 11/07/2014    Procedure: FLEXIBLE SIGMOIDOSCOPY;  Surgeon: Jerene Bears, MD;  Location: [facility] ENDOSCOPY;  Service: Endoscopy;  Laterality: N/A;   Family History  Problem Relation Age of Onset  . Colon cancer Mother    History  Substance Use Topics  . Smoking status: Current Some Day Smoker -- 0.50 packs/day for 66 years    Types: Cigarettes  . Smokeless tobacco: Former Systems developer    Types: Snuff, Chew     Comment: 05/27/2013 "haven't used chew or snuff in ~ 30 yr"  . Alcohol Use: No     Comment: 05/27/2013 "none in years; never had problem w/it" - not in 30 years    Review of Systems  Gastrointestinal:  Positive for abdominal pain, diarrhea and blood in stool. Negative for nausea and vomiting.  All other systems reviewed and are negative.     Allergies  Review of patient's allergies indicates no known allergies.  Home Medications   Prior to Admission medications   Medication Sig Start Date End Date Taking? Authorizing Provider  Calcium Carbonate-Vit D-Min (CALTRATE 600+D PLUS MINERALS) 600-800 MG-UNIT TABS Take 1 tablet by mouth daily.    Historical Provider, MD  carvedilol (COREG) 6.25 MG tablet Take 1 tablet (6.25 mg total) by mouth 2 (two) times daily with a meal. 11/16/14   Velvet Bathe, MD  finasteride (PROSCAR) 5 MG tablet Take 5 mg by mouth daily.    Historical Provider, MD   HYDROcodone-acetaminophen (NORCO/VICODIN) 5-325 MG per tablet Take 1 tablet by mouth 2 (two) times daily as needed for severe pain. 12/05/14   Everlene Balls, MD  Lactobacillus-Inulin ([place]) CAPS Take 1 capsule by mouth 3 (three) times daily. Patient not taking: Reported on 12/12/2014 12/12/14   April Palumbo, MD  loperamide (IMODIUM A-D) 2 MG tablet Take 2 mg by mouth 4 (four) times daily as needed for diarrhea or loose stools.    Historical Provider, MD  magnesium oxide (MAG-OX) 400 MG tablet Take 400 mg by mouth 2 (two) times daily.    Historical Provider, MD  pravastatin (PRAVACHOL) 10 MG tablet Take 10 mg by mouth at bedtime.    Historical Provider, MD  sertraline (ZOLOFT) 50 MG tablet Take 50 mg by mouth every morning.     Historical Provider, MD  traMADol (ULTRAM) 50 MG tablet Take 50 mg by mouth every 6 (six) hours as needed for moderate pain.    Historical Provider, MD   Pulse 101  Temp(Src) 99.3 F (37.4 C) (Oral)  Resp 16  SpO2 99%   Physical Exam  Constitutional: He is oriented to person, place, and time. He appears well-developed and well-nourished.  Extremely thin male who appears older than stated age  HENT:  Head: Normocephalic and atraumatic.  Nose: Nose normal.  Mouth/Throat: Oropharynx is clear and moist.  Eyes: Conjunctivae and EOM are normal. Pupils are equal, round, and reactive to light.  Neck: Normal range of motion. Neck supple. No JVD present. No tracheal deviation present. No thyromegaly present.  Cardiovascular: Normal rate, regular rhythm, normal heart sounds and intact distal pulses.  Exam reveals no gallop and no friction rub.   No murmur heard. Pulmonary/Chest: Effort normal and breath sounds normal. No stridor. No respiratory distress. He has no wheezes. He has no rales. He exhibits no tenderness.  Abdominal: Soft. Bowel sounds are normal. He exhibits no distension and no mass. There is tenderness (mild diffuse tenderness). There is no  rebound and no guarding.  Genitourinary: Guaiac positive stool.  Musculoskeletal: Normal range of motion. He exhibits no edema or tenderness.  Lymphadenopathy:    He has no cervical adenopathy.  Neurological: He is alert and oriented to person, place, and time. He displays normal reflexes. He exhibits normal muscle tone. Coordination normal.  Skin: Skin is warm and dry. No rash noted. No erythema. No pallor.  Psychiatric: He has a normal mood and affect. His behavior is normal. Judgment and thought content normal.  Nursing note and vitals reviewed.   ED Course  Procedures (including critical care time) DIAGNOSTIC STUDIES: Oxygen Saturation is 99% on RA, normal by my interpretation.    COORDINATION OF CARE: 1:47 AM-Discussed treatment plan with pt at bedside and pt agreed to plan.  Labs Review Labs Reviewed  CBC - Abnormal; Notable for the following:    RBC 3.39 (*)    Hemoglobin 10.3 (*)    HCT 33.0 (*)    RDW 16.7 (*)    Platelets 144 (*)    All other components within normal limits  POC OCCULT BLOOD, ED - Abnormal; Notable for the following:    Fecal Occult Bld POSITIVE (*)    All other components within normal limits  POC OCCULT BLOOD, ED    Imaging Review No results found.   EKG Interpretation None      MDM   Final diagnoses:  Rectal bleeding      I personally performed the services described in this documentation, which was scribed in my presence. The recorded information has been reviewed and is accurate.  69 year old male with known mass in rectum that on his last flexible sigmoidoscopy in May was friable and most likely the source of his bleeding.  Patient has had multiple ED visits for rectal bleeding.  He is heme-positive from below, but does not have active bleeding at this time, his hemoglobin is stable from multiple prior checks.  Patient instructed that he must call the surgery office in the morning to follow-up with Dr. Marcello Moores to address the  rectal mass.  He is informed that he will intermittently have some rectal bleeding associated with this mass until it is addressed.     Linton Flemings, MD 12/26/14 [phone]

## 2014-12-26 NOTE — Discharge Instructions (Signed)
You have a mass in your rectum that was seen by Dr Hilarie Fredrickson on 5/30.  You need to be seen by Dr Marcello Moores, a general surgeon, to go over plans to remove this mass.  This mass will bleed from time to time.  You are not supposed to be taking any anti-coagulants at this time.  It is VERY IMPORTANT to call and make the appointment with Dr Marcello Moores.  Your blood counts today are stable and unchanged from your most recent levels.  You do not need admission or blood transfusion at this time.   Bloody Stools Bloody stools often mean that there is a problem in the digestive tract. Your caregiver may use the term "melena" to describe black, tarry, and bad smelling stools or "hematochezia" to describe red or maroon-colored stools. Blood seen in the stool can be caused by bleeding anywhere along the intestinal tract.  A black stool usually means that blood is coming from the upper part of the gastrointestinal tract (esophagus, stomach, or small bowel). Passing maroon-colored stools or bright red blood usually means that blood is coming from lower down in the large bowel or the rectum. However, sometimes massive bleeding in the stomach or small intestine can cause bright red bloody stools.  Consuming black licorice, lead, iron pills, medicines containing bismuth subsalicylate, or blueberries can also cause black stools. Your caregiver can test black stools to see if blood is present. It is important that the cause of the bleeding be found. Treatment can then be started, and the problem can be corrected. Rectal bleeding may not be serious, but you should not assume everything is okay until you know the cause.It is very important to follow up with your caregiver or a specialist in gastrointestinal problems. CAUSES  Blood in the stools can come from various underlying causes.Often, the cause is not found during your first visit. Testing is often needed to discover the cause of bleeding in the gastrointestinal tract. Causes range  from simple to serious or even life-threatening.Possible causes include:  Hemorrhoids.These are veins that are full of blood (engorged) in the rectum. They cause pain, inflammation, and may bleed.  Anal fissures.These are areas of painful tearing which may bleed. They are often caused by passing hard stool.  Diverticulosis.These are pouches that form on the colon over time, with age, and may bleed significantly.  Diverticulitis.This is inflammation in areas with diverticulosis. It can cause pain, fever, and bloody stools, although bleeding is rare.  Proctitis and colitis. These are inflamed areas of the rectum or colon. They may cause pain, fever, and bloody stools.  Polyps and cancer. Colon cancer is a leading cause of preventable cancer death.It often starts out as precancerous polyps that can be removed during a colonoscopy, preventing progression into cancer. Sometimes, polyps and cancer may cause rectal bleeding.  Gastritis and ulcers.Bleeding from the upper gastrointestinal tract (near the stomach) may travel through the intestines and produce black, sometimes tarry, often bad smelling stools. In certain cases, if the bleeding is fast enough, the stools may not be black, but red and the condition may be life-threatening. SYMPTOMS  You may have stools that are bright red and bloody, that are normal color with blood on them, or that are dark black and tarry. In some cases, you may only have blood in the toilet bowl. Any of these cases need medical care. You may also have:  Pain at the anus or anywhere in the rectum.  Lightheadedness or feeling faint.  Extreme weakness.  Nausea or vomiting.  Fever. DIAGNOSIS Your caregiver may use the following methods to find the cause of your bleeding:  Taking a medical history. Age is important. Older people tend to develop polyps and cancer more often. If there is anal pain and a hard, large stool associated with bleeding, a tear of the  anus may be the cause. If blood drips into the toilet after a bowel movement, bleeding hemorrhoids may be the problem. The color and frequency of the bleeding are additional considerations. In most cases, the medical history provides clues, but seldom the final answer.  A visual and finger (digital) exam. Your caregiver will inspect the anal area, looking for tears and hemorrhoids. A finger exam can provide information when there is tenderness or a growth inside. In men, the prostate is also examined.  Endoscopy. Several types of small, long scopes (endoscopes) are used to view the colon.  In the office, your caregiver may use a rigid, or more commonly, a flexible viewing sigmoidoscope. This exam is called flexible sigmoidoscopy. It is performed in 5 to 10 minutes.  A more thorough exam is accomplished with a colonoscope. It allows your caregiver to view the entire 5 to 6 foot long colon. Medicine to help you relax (sedative) is usually given for this exam. Frequently, a bleeding lesion may be present beyond the reach of the sigmoidoscope. So, a colonoscopy may be the best exam to start with. Both exams are usually done on an outpatient basis. This means the patient does not stay overnight in the hospital or surgery center.  An upper endoscopy may be needed to examine your stomach. Sedation is used and a flexible endoscope is put in your mouth, down to your stomach.  A barium enema X-ray. This is an X-ray exam. It uses liquid barium inserted by enema into the rectum. This test alone may not identify an actual bleeding point. X-rays highlight abnormal shadows, such as those made by lumps (tumors), diverticuli, or colitis. TREATMENT  Treatment depends on the cause of your bleeding.   For bleeding from the stomach or colon, the caregiver doing your endoscopy or colonoscopy may be able to stop the bleeding as part of the procedure.  Inflammation or infection of the colon can be treated with  medicines.  Many rectal problems can be treated with creams, suppositories, or warm baths.  Surgery is sometimes needed.  Blood transfusions are sometimes needed if you have lost a lot of blood.  For any bleeding problem, let your caregiver know if you take aspirin or other blood thinners regularly. HOME CARE INSTRUCTIONS   Take any medicines exactly as prescribed.  Keep your stools soft by eating a diet high in fiber. Prunes (1 to 3 a day) work well for many people.  Drink enough water and fluids to keep your urine clear or pale yellow.  Take sitz baths if advised. A sitz bath is when you sit in a bathtub with warm water for 10 to 15 minutes to soak, soothe, and cleanse the rectal area.  If enemas or suppositories are advised, be sure you know how to use them. Tell your caregiver if you have problems with this.  Monitor your bowel movements to look for signs of improvement or worsening. SEEK MEDICAL CARE IF:   You do not improve in the time expected.  Your condition worsens after initial improvement.  You develop any new symptoms. SEEK IMMEDIATE MEDICAL CARE IF:   You develop severe or prolonged rectal bleeding.  You vomit blood.  You feel weak or faint.  You have a fever. MAKE SURE YOU:  Understand these instructions.  Will watch your condition.  Will get help right away if you are not doing well or get worse. Document Released: 05/17/2002 Document Revised: 08/19/2011 Document Reviewed: 10/12/2010 [facility] Patient Information 2015 [place], [place]. This information is not intended to replace advice given to you by your health care provider. Make sure you discuss any questions you have with your health care provider.

## 2014-12-26 NOTE — ED Notes (Signed)
Given pt specific instructions to follow up with surgeon to have bleeding mass removed.

## 2014-12-27 ENCOUNTER — Emergency Department (HOSPITAL_COMMUNITY)
Admission: EM | Admit: 2014-12-27 | Discharge: 2014-12-27 | Disposition: A | Discharge status: Home / Self Care | Payer: Medicare Other | Attending: Emergency Medicine | Admitting: Emergency Medicine

## 2014-12-27 ENCOUNTER — Encounter (HOSPITAL_COMMUNITY): Payer: Self-pay | Admitting: Emergency Medicine

## 2014-12-27 DIAGNOSIS — Y998 Other external cause status: Secondary | ICD-10-CM | POA: Diagnosis not present

## 2014-12-27 DIAGNOSIS — F329 Major depressive disorder, single episode, unspecified: Secondary | ICD-10-CM | POA: Diagnosis not present

## 2014-12-27 DIAGNOSIS — Z950 Presence of cardiac pacemaker: Secondary | ICD-10-CM | POA: Insufficient documentation

## 2014-12-27 DIAGNOSIS — E785 Hyperlipidemia, unspecified: Secondary | ICD-10-CM | POA: Insufficient documentation

## 2014-12-27 DIAGNOSIS — Z792 Long term (current) use of antibiotics: Secondary | ICD-10-CM | POA: Diagnosis not present

## 2014-12-27 DIAGNOSIS — Z8739 Personal history of other diseases of the musculoskeletal system and connective tissue: Secondary | ICD-10-CM | POA: Diagnosis not present

## 2014-12-27 DIAGNOSIS — Y9389 Activity, other specified: Secondary | ICD-10-CM | POA: Diagnosis not present

## 2014-12-27 DIAGNOSIS — I10 Essential (primary) hypertension: Secondary | ICD-10-CM | POA: Diagnosis not present

## 2014-12-27 DIAGNOSIS — S3991XA Unspecified injury of abdomen, initial encounter: Secondary | ICD-10-CM | POA: Insufficient documentation

## 2014-12-27 DIAGNOSIS — I4891 Unspecified atrial fibrillation: Secondary | ICD-10-CM | POA: Insufficient documentation

## 2014-12-27 DIAGNOSIS — N400 Enlarged prostate without lower urinary tract symptoms: Secondary | ICD-10-CM | POA: Insufficient documentation

## 2014-12-27 DIAGNOSIS — Z8719 Personal history of other diseases of the digestive system: Secondary | ICD-10-CM | POA: Insufficient documentation

## 2014-12-27 DIAGNOSIS — Z79899 Other long term (current) drug therapy: Secondary | ICD-10-CM | POA: Insufficient documentation

## 2014-12-27 DIAGNOSIS — R109 Unspecified abdominal pain: Secondary | ICD-10-CM

## 2014-12-27 DIAGNOSIS — Z590 Homelessness: Secondary | ICD-10-CM | POA: Insufficient documentation

## 2014-12-27 DIAGNOSIS — Y9289 Other specified places as the place of occurrence of the external cause: Secondary | ICD-10-CM | POA: Diagnosis not present

## 2014-12-27 LAB — URINALYSIS, ROUTINE W REFLEX MICROSCOPIC
BILIRUBIN URINE: NEGATIVE
GLUCOSE, UA: NEGATIVE mg/dL
Hgb urine dipstick: NEGATIVE
Ketones, ur: NEGATIVE mg/dL
LEUKOCYTES UA: NEGATIVE
Nitrite: NEGATIVE
PH: 6 (ref 5.0–8.0)
PROTEIN: NEGATIVE mg/dL
Specific Gravity, Urine: 1.011 (ref 1.005–1.030)
UROBILINOGEN UA: 0.2 mg/dL (ref 0.0–1.0)

## 2014-12-27 LAB — COMPREHENSIVE METABOLIC PANEL
ALT: 13 U/L — AB (ref 17–63)
AST: 17 U/L (ref 15–41)
Albumin: 3.1 g/dL — ABNORMAL LOW (ref 3.5–5.0)
Alkaline Phosphatase: 81 U/L (ref 38–126)
Anion gap: 6 (ref 5–15)
BILIRUBIN TOTAL: 0.5 mg/dL (ref 0.3–1.2)
BUN: 21 mg/dL — AB (ref 6–20)
CALCIUM: 8.4 mg/dL — AB (ref 8.9–10.3)
CHLORIDE: 107 mmol/L (ref 101–111)
CO2: 22 mmol/L (ref 22–32)
CREATININE: 0.68 mg/dL (ref 0.61–1.24)
GFR calc Af Amer: >60 mL/min (ref >60)
GLUCOSE: 104 mg/dL — AB (ref 65–99)
Potassium: 4.2 mmol/L (ref 3.5–5.1)
Sodium: 135 mmol/L (ref 135–145)
TOTAL PROTEIN: 6.6 g/dL (ref 6.5–8.1)
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
## 2014-12-27 NOTE — Discharge Instructions (Signed)

## 2014-12-27 NOTE — ED Notes (Signed)
Pt to lobby to wait for taxi.

## 2014-12-27 NOTE — ED Notes (Signed)
Pt placed in bed. Pt monitored by pulse ox, bp cuff, and 5-lead.
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
## 2014-12-27 NOTE — ED Notes (Signed)
Urine specimen sent to lab. Clean catch in urinal.

## 2014-12-27 NOTE — ED Notes (Addendum)
Patient lives at [facility], [place], [place].  Phone #: [phone]   On the phone with [place]: Spoke with Waunita Schooner, Director, and patient is able to come back, transportation just has to be provided.

## 2014-12-27 NOTE — ED Notes (Signed)
Tried to call [facility].  They will not
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
## 2014-12-27 NOTE — ED Notes (Signed)
[facility].

## 2014-12-28 ENCOUNTER — Emergency Department (HOSPITAL_COMMUNITY)
Admission: EM | Admit: 2014-12-28 | Discharge: 2014-12-28 | Disposition: A | Discharge status: Home / Self Care | Payer: Medicare Other | Attending: Emergency Medicine | Admitting: Emergency Medicine

## 2014-12-28 ENCOUNTER — Encounter (HOSPITAL_COMMUNITY): Payer: Self-pay | Admitting: Emergency Medicine

## 2014-12-28 DIAGNOSIS — Z950 Presence of cardiac pacemaker: Secondary | ICD-10-CM | POA: Diagnosis not present

## 2014-12-28 DIAGNOSIS — Z720 Tobacco use: Secondary | ICD-10-CM | POA: Diagnosis not present

## 2014-12-28 DIAGNOSIS — Z79899 Other long term (current) drug therapy: Secondary | ICD-10-CM | POA: Insufficient documentation

## 2014-12-28 DIAGNOSIS — E785 Hyperlipidemia, unspecified: Secondary | ICD-10-CM | POA: Diagnosis not present

## 2014-12-28 DIAGNOSIS — I10 Essential (primary) hypertension: Secondary | ICD-10-CM | POA: Diagnosis not present

## 2014-12-28 DIAGNOSIS — G43909 Migraine, unspecified, not intractable, without status migrainosus: Secondary | ICD-10-CM | POA: Insufficient documentation

## 2014-12-28 DIAGNOSIS — M109 Gout, unspecified: Secondary | ICD-10-CM | POA: Insufficient documentation

## 2014-12-28 DIAGNOSIS — R079 Chest pain, unspecified: Secondary | ICD-10-CM | POA: Insufficient documentation

## 2014-12-28 DIAGNOSIS — F329 Major depressive disorder, single episode, unspecified: Secondary | ICD-10-CM | POA: Diagnosis not present

## 2014-12-28 LAB — I-STAT CHEM 8, ED
BUN: 24 mg/dL — ABNORMAL HIGH (ref 6–20)
CALCIUM ION: 1.19 mmol/L (ref 1.13–1.30)
Chloride: 105 mmol/L (ref 101–111)
Creatinine, Ser: 0.7 mg/dL (ref 0.61–1.24)
GLUCOSE: 87 mg/dL (ref 65–99)
HEMATOCRIT: 34 % — AB (ref 39.0–52.0)
HEMOGLOBIN: 11.6 g/dL — AB (ref 13.0–17.0)
Potassium: 4.2 mmol/L (ref 3.5–5.1)
Sodium: 140 mmol/L (ref 135–145)
TCO2: 21 mmol/L (ref 0–100)

## 2014-12-28 LAB — I-STAT TROPONIN, ED
Troponin i, poc: 0.01 ng/mL (ref 0.00–0.08)
Troponin i, poc: 0 ng/mL (ref 0.00–0.08)

## 2014-12-28 NOTE — Discharge Instructions (Signed)

## 2014-12-28 NOTE — ED Provider Notes (Signed)
CSN: [id number]     Arrival date & time 12/28/14  1456 History   First MD Initiated Contact with Patient 12/28/14 1558     Chief Complaint  Patient presents with  . Chest Pain     (Consider location/radiation/quality/duration/timing/severity/associated sxs/prior Treatment) HPI  69 year old male with hx of chronic chest pain, CHF, pacemaker presents with recurrent chest pain. Started after lunch around 12 pm. Sharp, left sided. Not worse with inspiration. No coughing. Patient has been here multiple times for chest pain and states this pain is similar to those. No leg swelling. No trouble breathing, abdominal pain or N/V. Given 1 nitro by EMS, states it helped. Pain spontaneously resolved prior to me seeing patient.   Past Medical History  Diagnosis Date  . Depression   . Hypomagnesemia   . Fall at nursing home 05/27/2013    "slipped on water; said I broke my right hip" (05/27/2013)  . Migraine     "used to have them bad; haven't had one in awhile" (05/27/2013)  . Gout attack     "not too long ago; in my right foot" (05/27/2013)  . Complete heart block     reason for pacemaker/notes 05/27/2013  . Atrial fibrillation     [name] 05/27/2013  . HLD (hyperlipidemia)   . GIB (gastrointestinal bleeding)   . BPH (benign prostatic hyperplasia)   . Presence of permanent cardiac pacemaker   . Hypertension    Past Surgical History  Procedure Laterality Date  . Insert / replace / remove pacemaker    . Appendectomy    . Cholecystectomy    . Open reduction of hip Right 05/28/2013    Procedure: OPEN REDUCTION OF HIP;  Surgeon: Renette Butters, MD;  Location: [place];  Service: Orthopedics;  Laterality: Right;  . Esophagogastroduodenoscopy (egd) with propofol N/A 09/26/2014    Procedure: ESOPHAGOGASTRODUODENOSCOPY (EGD) WITH PROPOFOL;  Surgeon: Jerene Bears, MD;  Location: [facility] ENDOSCOPY;  Service: Endoscopy;  Laterality: N/A;  . Colonoscopy N/A 10/01/2014    Procedure: COLONOSCOPY;  Surgeon: Jerene Bears, MD;  Location: [facility] ENDOSCOPY;  Service: Endoscopy;  Laterality: N/A;  . Partial colectomy N/A 10/05/2014    Procedure: SUBTOTAL COLECTOMY WITH ILEORECTAL ANASTOMOSIS;  Surgeon: Donnie Mesa, MD;  Location: [place];  Service: General;  Laterality: N/A;  . Flexible sigmoidoscopy N/A 11/07/2014    Procedure: FLEXIBLE SIGMOIDOSCOPY;  Surgeon: Jerene Bears, MD;  Location: [facility] ENDOSCOPY;  Service: Endoscopy;  Laterality: N/A;   Family History  Problem Relation Age of Onset  . Colon cancer Mother    History  Substance Use Topics  . Smoking status: Current Some Day Smoker -- 0.50 packs/day for 66 years    Types: Cigarettes  . Smokeless tobacco: Former Systems developer    Types: Snuff, Chew     Comment: 05/27/2013 "haven't used chew or snuff in ~ 30 yr"  . Alcohol Use: No     Comment: 05/27/2013 "none in years; never had problem w/it" - not in 30 years    Review of Systems  Constitutional: Negative for fever.  Respiratory: Negative for shortness of breath.   Cardiovascular: Positive for chest pain. Negative for leg swelling.  Gastrointestinal: Negative for nausea and vomiting.  All other systems reviewed and are negative.     Allergies  Review of patient's allergies indicates no known allergies.  Home Medications   Prior to Admission medications   Medication Sig Start Date End Date Taking? Authorizing Provider  Calcium Carbonate-Vit D-Min (CALTRATE  600+D PLUS MINERALS) 600-800 MG-UNIT TABS Take 1 tablet by mouth daily.    Historical Provider, MD  carvedilol (COREG) 6.25 MG tablet Take 1 tablet (6.25 mg total) by mouth 2 (two) times daily with a meal. 11/16/14   Velvet Bathe, MD  finasteride (PROSCAR) 5 MG tablet Take 5 mg by mouth daily.    Historical Provider, MD  HYDROcodone-acetaminophen (NORCO/VICODIN) 5-325 MG per tablet Take 1 tablet by mouth 2 (two) times daily as needed for severe pain. 12/05/14   Everlene Balls, MD  Lactobacillus-Inulin ([place]) CAPS Take 1 capsule  by mouth 3 (three) times daily. Patient not taking: Reported on 12/12/2014 12/12/14   April Palumbo, MD  loperamide (IMODIUM A-D) 2 MG tablet Take 2 mg by mouth 4 (four) times daily as needed for diarrhea or loose stools.    Historical Provider, MD  magnesium oxide (MAG-OX) 400 MG tablet Take 400 mg by mouth 2 (two) times daily.    Historical Provider, MD  pravastatin (PRAVACHOL) 10 MG tablet Take 10 mg by mouth at bedtime.    Historical Provider, MD  sertraline (ZOLOFT) 50 MG tablet Take 50 mg by mouth every morning.     Historical Provider, MD  traMADol (ULTRAM) 50 MG tablet Take 50 mg by mouth every 6 (six) hours as needed for moderate pain.    Historical Provider, MD   BP 114/57 mmHg  Pulse 77  Temp(Src) 98.2 F (36.8 C) (Oral)  Resp 14  SpO2 100% Physical Exam  Constitutional: He is oriented to person, place, and time. He appears well-developed and well-nourished.  HENT:  Head: Normocephalic and atraumatic.  Right Ear: External ear normal.  Left Ear: External ear normal.  Nose: Nose normal.  Eyes: Right eye exhibits no discharge. Left eye exhibits no discharge.  Neck: Neck supple.  Cardiovascular: Normal rate, regular rhythm, normal heart sounds and intact distal pulses.   Pulmonary/Chest: Effort normal and breath sounds normal. He exhibits tenderness.    Abdominal: Soft. There is no tenderness.  Musculoskeletal: He exhibits no edema or tenderness.  Neurological: He is alert and oriented to person, place, and time.  Skin: Skin is warm and dry.  Nursing note and vitals reviewed.   ED Course  Procedures (including critical care time) Labs Review Labs Reviewed  I-STAT CHEM 8, ED - Abnormal; Notable for the following:    BUN 24 (*)    Hemoglobin 11.6 (*)    HCT 34.0 (*)    All other components within normal limits  I-STAT TROPOININ, ED  Randolm Idol, ED    Imaging Review Dg Chest 2 View  12/26/2014   CLINICAL DATA:  Chest pain, shortness of breath and cough.  EXAM:  CHEST  2 VIEW  COMPARISON:  12/15/2014  FINDINGS: Right-sided pacemaker unchanged. Lungs are adequately inflated without consolidation or effusion. Mild cardiomegaly unchanged. Calcified plaque over the thoracic aorta. Mild degenerative change of the spine.  IMPRESSION: No active cardiopulmonary disease.   Electronically Signed   By: Marin Olp M.D.   On: 12/26/2014 23:17     EKG Interpretation   Date/Time:  Wednesday December 28 2014 15:03:13 EDT Ventricular Rate:  71 PR Interval:    QRS Duration: 106 QT Interval:  424 QTC Calculation: 461 R Axis:   -124 Text Interpretation:  Atrial fibrillation Anteroseptal infarct, age  indeterminate artifact noted No significant change since last tracing  Confirmed by Christy Gentles  MD, DONALD ([id number]) on 12/28/2014 3:10:51 PM      MDM  Final diagnoses:  Chest pain, unspecified chest pain type    Patient with atypical chest pain. Has been seen here frequently for similar symptoms. She has been pain-free for several hours. 2 troponins are negative. With unremarkable EKG besides atrophic fibrillation, 2 negative troponins, and recurrent atypical reproducible chest pain, I have low suspicion for ACS, pulmonary embolism, or dissection. Stable for discharge home.    Sherwood Gambler, MD 12/28/14 [phone]

## 2014-12-28 NOTE — ED Notes (Signed)
Per EMS - pt is experiencing 10/10 substernal chest pain, non radiating, sharp in character. Began 1 hour ago while watching television. Does not change with movement or inspiration. Frequent chest pain hx. Pt has pacemaker. Pt in a fib/a flutter, with pausing and pacemaker fire. Denies nausea/vomiting. EMS gave 1 nitro, BP down from 416 systolic to 94. Pt received 200 cc NS. A/O x4.

## 2014-12-29 ENCOUNTER — Emergency Department (HOSPITAL_COMMUNITY)
Admission: EM | Admit: 2014-12-29 | Discharge: 2014-12-29 | Disposition: A | Discharge status: Home / Self Care | Payer: Medicare Other | Attending: Emergency Medicine | Admitting: Emergency Medicine

## 2014-12-29 ENCOUNTER — Encounter (HOSPITAL_COMMUNITY): Payer: Self-pay | Admitting: *Deleted

## 2014-12-29 DIAGNOSIS — G43909 Migraine, unspecified, not intractable, without status migrainosus: Secondary | ICD-10-CM | POA: Insufficient documentation

## 2014-12-29 DIAGNOSIS — Z950 Presence of cardiac pacemaker: Secondary | ICD-10-CM | POA: Insufficient documentation

## 2014-12-29 DIAGNOSIS — R197 Diarrhea, unspecified: Secondary | ICD-10-CM | POA: Diagnosis not present

## 2014-12-29 DIAGNOSIS — F329 Major depressive disorder, single episode, unspecified: Secondary | ICD-10-CM | POA: Diagnosis not present

## 2014-12-29 DIAGNOSIS — M109 Gout, unspecified: Secondary | ICD-10-CM | POA: Insufficient documentation

## 2014-12-29 DIAGNOSIS — I10 Essential (primary) hypertension: Secondary | ICD-10-CM | POA: Insufficient documentation

## 2014-12-29 DIAGNOSIS — R1031 Right lower quadrant pain: Secondary | ICD-10-CM | POA: Diagnosis present

## 2014-12-29 DIAGNOSIS — G8929 Other chronic pain: Secondary | ICD-10-CM | POA: Diagnosis not present

## 2014-12-29 DIAGNOSIS — Z720 Tobacco use: Secondary | ICD-10-CM | POA: Diagnosis not present

## 2014-12-29 DIAGNOSIS — E785 Hyperlipidemia, unspecified: Secondary | ICD-10-CM | POA: Diagnosis not present

## 2014-12-29 DIAGNOSIS — K625 Hemorrhage of anus and rectum: Secondary | ICD-10-CM | POA: Insufficient documentation

## 2014-12-29 DIAGNOSIS — Z79899 Other long term (current) drug therapy: Secondary | ICD-10-CM | POA: Diagnosis not present

## 2014-12-29 DIAGNOSIS — R109 Unspecified abdominal pain: Secondary | ICD-10-CM | POA: Diagnosis not present

## 2014-12-29 NOTE — ED Notes (Signed)
Pt waiting in waiting room for

## 2014-12-29 NOTE — ED Notes (Addendum)
Pt. Given blue scrubs and new socks. Offered pt. A shower he declined.  Pt. Cleansed himself. Pt. Has requested to go home. Pt. Given a drink and sandwich.  P=tar called by medical Secretary to transfer pt. To home.

## 2014-12-29 NOTE — ED Notes (Signed)
Pt arrived by [facility] for abd pain, hx of colon cancer.

## 2014-12-29 NOTE — ED Notes (Signed)
Pt remains in the waiting room for waiting on PTAR.

## 2014-12-29 NOTE — ED Provider Notes (Signed)
CSN: [id number]     Arrival date & time 12/29/14  1432 History   None    Chief Complaint  Patient presents with  . Abdominal Pain     (Consider location/radiation/quality/duration/timing/severity/associated sxs/prior Treatment) Patient is a 69 y.o. male presenting with abdominal pain. The history is provided by the patient. No language interpreter was used.  Abdominal Pain Pain location:  RLQ and LLQ Pain quality: aching   Pain radiates to:  Does not radiate Pain severity:  Moderate Onset quality:  Gradual Timing:  Intermittent Progression:  Waxing and waning Chronicity:  Chronic Context: previous surgery and trauma   Relieved by:  Nothing Worsened by:  Nothing tried Ineffective treatments:  None tried Associated symptoms: diarrhea and nausea   Associated symptoms: no anorexia, no chest pain, no chills, no constipation, no cough, no dysuria, no fatigue, no fever, no shortness of breath and no vomiting   Risk factors: multiple surgeries     Past Medical History  Diagnosis Date  . Depression   . Hypomagnesemia   . Fall at nursing home 05/27/2013    "slipped on water; said I broke my right hip" (05/27/2013)  . Migraine     "used to have them bad; haven't had one in awhile" (05/27/2013)  . Gout attack     "not too long ago; in my right foot" (05/27/2013)  . Complete heart block     reason for pacemaker/notes 05/27/2013  . Atrial fibrillation     [name] 05/27/2013  . HLD (hyperlipidemia)   . GIB (gastrointestinal bleeding)   . BPH (benign prostatic hyperplasia)   . Presence of permanent cardiac pacemaker   . Hypertension    Past Surgical History  Procedure Laterality Date  . Insert / replace / remove pacemaker    . Appendectomy    . Cholecystectomy    . Open reduction of hip Right 05/28/2013    Procedure: OPEN REDUCTION OF HIP;  Surgeon: Renette Butters, MD;  Location: [place];  Service: Orthopedics;  Laterality: Right;  . Esophagogastroduodenoscopy (egd) with  propofol N/A 09/26/2014    Procedure: ESOPHAGOGASTRODUODENOSCOPY (EGD) WITH PROPOFOL;  Surgeon: Jerene Bears, MD;  Location: [facility] ENDOSCOPY;  Service: Endoscopy;  Laterality: N/A;  . Colonoscopy N/A 10/01/2014    Procedure: COLONOSCOPY;  Surgeon: Jerene Bears, MD;  Location: [facility] ENDOSCOPY;  Service: Endoscopy;  Laterality: N/A;  . Partial colectomy N/A 10/05/2014    Procedure: SUBTOTAL COLECTOMY WITH ILEORECTAL ANASTOMOSIS;  Surgeon: Donnie Mesa, MD;  Location: [place];  Service: General;  Laterality: N/A;  . Flexible sigmoidoscopy N/A 11/07/2014    Procedure: FLEXIBLE SIGMOIDOSCOPY;  Surgeon: Jerene Bears, MD;  Location: [facility] ENDOSCOPY;  Service: Endoscopy;  Laterality: N/A;   Family History  Problem Relation Age of Onset  . Colon cancer Mother    History  Substance Use Topics  . Smoking status: Current Some Day Smoker -- 0.50 packs/day for 66 years    Types: Cigarettes  . Smokeless tobacco: Former Systems developer    Types: Snuff, Chew     Comment: 05/27/2013 "haven't used chew or snuff in ~ 30 yr"  . Alcohol Use: No     Comment: 05/27/2013 "none in years; never had problem w/it" - not in 30 years    Review of Systems  Constitutional: Negative for fever, chills and fatigue.  Respiratory: Negative for cough, chest tightness and shortness of breath.   Cardiovascular: Negative for chest pain.  Gastrointestinal: Positive for nausea, abdominal pain, diarrhea, blood in stool and  anal bleeding. Negative for vomiting, constipation and anorexia.  Genitourinary: Negative for dysuria.  Neurological: Negative for light-headedness and headaches.  Psychiatric/Behavioral: Negative for confusion.  All other systems reviewed and are negative.     Allergies  Review of patient's allergies indicates no known allergies.  Home Medications   Prior to Admission medications   Medication Sig Start Date End Date Taking? Authorizing Provider  Calcium Carbonate-Vit D-Min (CALTRATE 600+D PLUS MINERALS) 600-800 MG-UNIT  TABS Take 1 tablet by mouth daily.    Historical Provider, MD  carvedilol (COREG) 6.25 MG tablet Take 1 tablet (6.25 mg total) by mouth 2 (two) times daily with a meal. 11/16/14   Velvet Bathe, MD  finasteride (PROSCAR) 5 MG tablet Take 5 mg by mouth daily.    Historical Provider, MD  HYDROcodone-acetaminophen (NORCO/VICODIN) 5-325 MG per tablet Take 1 tablet by mouth 2 (two) times daily as needed for severe pain. 12/05/14   Everlene Balls, MD  Lactobacillus-Inulin ([place]) CAPS Take 1 capsule by mouth 3 (three) times daily. Patient not taking: Reported on 12/12/2014 12/12/14   April Palumbo, MD  loperamide (IMODIUM A-D) 2 MG tablet Take 2 mg by mouth 4 (four) times daily as needed for diarrhea or loose stools.    Historical Provider, MD  magnesium oxide (MAG-OX) 400 MG tablet Take 400 mg by mouth 2 (two) times daily.    Historical Provider, MD  pravastatin (PRAVACHOL) 10 MG tablet Take 10 mg by mouth at bedtime.    Historical Provider, MD  sertraline (ZOLOFT) 50 MG tablet Take 50 mg by mouth every morning.     Historical Provider, MD  traMADol (ULTRAM) 50 MG tablet Take 50 mg by mouth every 6 (six) hours as needed for moderate pain.    Historical Provider, MD   BP 114/88 mmHg  Pulse 85  Temp(Src) 98.6 F (37 C) (Oral)  Resp 21  SpO2 99% Physical Exam  Constitutional: He is oriented to person, place, and time. He appears well-developed and well-nourished. No distress.  In NAD  HENT:  Head: Normocephalic and atraumatic.  Nose: Nose normal.  Mouth/Throat: Oropharynx is clear and moist. No oropharyngeal exudate.  Edentulous   Eyes: EOM are normal. Pupils are equal, round, and reactive to light.  Neck: Normal range of motion. Neck supple.  Cardiovascular: Normal rate, regular rhythm, normal heart sounds and intact distal pulses.   No murmur heard. Pulmonary/Chest: Effort normal and breath sounds normal. No respiratory distress. He has no wheezes. He exhibits no tenderness.  No  chest wall tenderness  Abdominal: Soft. He exhibits no distension. There is no tenderness. There is no guarding.  Well healed surgical scar over lower abdomen, nontender to palpation.  Able to change positions easily with no pain.  No complaints when shaking the bed.  No masses palpable.   Musculoskeletal: Normal range of motion. He exhibits no tenderness.  Neurological: He is alert and oriented to person, place, and time. No cranial nerve deficit. Coordination normal.  Skin: Skin is warm and dry. He is not diaphoretic. No pallor.  Psychiatric: He has a normal mood and affect. His behavior is normal. Judgment and thought content normal.  Nursing note and vitals reviewed.   ED Course  Procedures (including critical care time) Labs Review Labs Reviewed - No data to display  Imaging Review No results found.   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  Reports he tripped over his shoes this morning and fell on a chair, impacting his abdomen.  Has chronic lower abdominal pain and reports the fall exacerbated his typical pain.  Denies any increase in his typical rectal bleeding, but was concerned that the fall caused his rectal bleeding.  On discussion today, he now states that his abdominal pain is at his baseline and his rectal bleeding is also at baseline.  Noted to have loose stool while in the ED, but he reports this is also typical for him.  Currently denies chest pain, SOB, N/V, is afebrile, and looks well.  Immediately after complaining of abdominal pain, he requested snacks, so was given a sandwich and coke and was able to tolerate it easily.    Had reassuring blood work  drawn daily for the past 3 days.  No indication that he would benefit from additional labs or imaging.  Considered stable for discharge home. [facility] called to transport him home.   Of note, patient was found stealing the ED clock off the wall and reported he was "just tired of looking at it", then later it was found sticking out of his backpack.  He was encouraged to restrain from taking ED property and voiced understanding.   Discharged via [place] in NAD.    Patient was seen with ED Attending, Dr. Alfonse Spruce, MD   Tori Milks, MD 12/30/14 [place], MD 12/30/14 [phone]

## 2014-12-29 NOTE — ED Notes (Signed)
Informed patient that we have called [facility] to get him back to the [facility]. Pt has become angry and reports he wants to leave and is getting angry. Pt is attempting to wheel himself out front in wheelchair. Talked with Charge RN and pt taken to the lobby to wait.

## 2014-12-29 NOTE — ED Notes (Addendum)
Social Work called about pt and will see patient. Informed from social work that pt needs to go back to the nursing home via [place].

## 2015-01-02 ENCOUNTER — Encounter: Payer: Medicare Other | Admitting: Hematology

## 2015-01-02 ENCOUNTER — Emergency Department (HOSPITAL_COMMUNITY)
Admission: EM | Admit: 2015-01-02 | Discharge: 2015-01-02 | Disposition: A | Discharge status: Home / Self Care | Payer: Medicare Other | Attending: Emergency Medicine | Admitting: Emergency Medicine

## 2015-01-02 ENCOUNTER — Emergency Department (HOSPITAL_COMMUNITY): Payer: Medicare Other

## 2015-01-02 ENCOUNTER — Encounter (HOSPITAL_COMMUNITY): Payer: Self-pay | Admitting: Emergency Medicine

## 2015-01-02 DIAGNOSIS — Z950 Presence of cardiac pacemaker: Secondary | ICD-10-CM | POA: Insufficient documentation

## 2015-01-02 DIAGNOSIS — Z590 Homelessness: Secondary | ICD-10-CM | POA: Insufficient documentation

## 2015-01-02 DIAGNOSIS — M109 Gout, unspecified: Secondary | ICD-10-CM | POA: Diagnosis not present

## 2015-01-02 DIAGNOSIS — Z720 Tobacco use: Secondary | ICD-10-CM | POA: Diagnosis not present

## 2015-01-02 DIAGNOSIS — R079 Chest pain, unspecified: Secondary | ICD-10-CM | POA: Diagnosis present

## 2015-01-02 DIAGNOSIS — G43909 Migraine, unspecified, not intractable, without status migrainosus: Secondary | ICD-10-CM | POA: Insufficient documentation

## 2015-01-02 DIAGNOSIS — F329 Major depressive disorder, single episode, unspecified: Secondary | ICD-10-CM | POA: Diagnosis not present

## 2015-01-02 DIAGNOSIS — E785 Hyperlipidemia, unspecified: Secondary | ICD-10-CM | POA: Insufficient documentation

## 2015-01-02 DIAGNOSIS — Z79899 Other long term (current) drug therapy: Secondary | ICD-10-CM | POA: Insufficient documentation

## 2015-01-02 DIAGNOSIS — I10 Essential (primary) hypertension: Secondary | ICD-10-CM | POA: Diagnosis not present

## 2015-01-02 LAB — CBC WITH DIFFERENTIAL/PLATELET
Basophils Absolute: 0 10*3/uL (ref 0.0–0.1)
Basophils Relative: 0 % (ref 0–1)
EOS PCT: 2 % (ref 0–5)
Eosinophils Absolute: 0.1 10*3/uL (ref 0.0–0.7)
HCT: 36.7 % — ABNORMAL LOW (ref 39.0–52.0)
Hemoglobin: 11.9 g/dL — ABNORMAL LOW (ref 13.0–17.0)
Lymphocytes Relative: 41 % (ref 12–46)
Lymphs Abs: 3.2 10*3/uL (ref 0.7–4.0)
MCH: 31.8 pg (ref 26.0–34.0)
MCHC: 32.4 g/dL (ref 30.0–36.0)
MCV: 98.1 fL (ref 78.0–100.0)
MONO ABS: 0.6 10*3/uL (ref 0.1–1.0)
Monocytes Relative: 8 % (ref 3–12)
NEUTROS ABS: 3.9 10*3/uL (ref 1.7–7.7)
Neutrophils Relative %: 49 % (ref 43–77)
PLATELETS: 196 10*3/uL (ref 150–400)
RBC: 3.74 MIL/uL — AB (ref 4.22–5.81)
RDW: 16.5 % — ABNORMAL HIGH (ref 11.5–15.5)
WBC: 7.8 10*3/uL (ref 4.0–10.5)

## 2015-01-02 LAB — BASIC METABOLIC PANEL
ANION GAP: 9 (ref 5–15)
BUN: 16 mg/dL (ref 6–20)
CHLORIDE: 101 mmol/L (ref 101–111)
CO2: 23 mmol/L (ref 22–32)
Calcium: 8.8 mg/dL — ABNORMAL LOW (ref 8.9–10.3)
Creatinine, Ser: 0.65 mg/dL (ref 0.61–1.24)
GFR calc Af Amer: >60 mL/min (ref >60)
GFR calc non Af Amer: >60 mL/min (ref >60)
GLUCOSE: 90 mg/dL (ref 65–99)
POTASSIUM: 3.8 mmol/L (ref 3.5–5.1)
Sodium: 133 mmol/L — ABNORMAL LOW (ref 135–145)

## 2015-01-02 LAB — TROPONIN I: Troponin I: <0.03 ng/mL (ref <0.031)

## 2015-01-02 MED ORDER — ASPIRIN 81 MG PO CHEW: 324.0000 mg | CHEWABLE_TABLET | Freq: Once | ORAL | Status: DC

## 2015-01-02 MED ORDER — HYDROCODONE-ACETAMINOPHEN 5-325 MG PO TABS
2.0000 | ORAL_TABLET | Freq: Once | ORAL | Status: AC
  Administered 2015-01-02: 2 via ORAL
  Filled 2015-01-02: qty 2

## 2015-01-02 NOTE — ED Notes (Signed)
Patient transported to X-ray 

## 2015-01-02 NOTE — Discharge Instructions (Signed)
If you were given medicines take as directed.  If you are on coumadin or contraceptives realize their levels and effectiveness is altered by many different medicines.  If you have any reaction (rash, tongues swelling, other) to the medicines stop taking and see a physician.    If your blood pressure was elevated in the ER make sure you follow up for management with a primary doctor or return for chest pain, shortness of breath or stroke symptoms.  Please follow up as directed and return to the ER or see a physician for new or worsening symptoms.  Thank you. Filed Vitals:   01/02/15 1229 01/02/15 1427  BP: 131/61 119/52  Pulse: 78 72  Temp: 97.7 F (36.5 C)   TempSrc: Oral   Resp: 16 20  SpO2: 96% 96%

## 2015-01-02 NOTE — ED Notes (Signed)
Pt left prior to dc paperwork

## 2015-01-02 NOTE — ED Provider Notes (Signed)
CSN: [id number]     Arrival date & time 01/02/15  1205 History   First MD Initiated Contact with Patient 01/02/15 1208     Chief Complaint  Patient presents with  . Chest Pain     (Consider location/radiation/quality/duration/timing/severity/associated sxs/prior Treatment) HPI Comments: 69 year old male with multiple medical issues including cardiomyopathy, ischemic heart disease, learning disability, atrial fibrillation, homeless, GI bleed, respiratory failure, malnutrition presents with anterior chest pain nonradiating, nothing specifically improves or worsens he has it constantly for months. No shortness of breath. Patient is also had intermittent diarrhea. Difficulty describing details likely due to learning disability.  Patient is a 69 y.o. male presenting with chest pain. The history is provided by the patient and medical records.  Chest Pain Associated symptoms: no abdominal pain, no back pain, no fever, no headache, no shortness of breath and not vomiting     Past Medical History  Diagnosis Date  . Depression   . Hypomagnesemia   . Fall at nursing home 05/27/2013    "slipped on water; said I broke my right hip" (05/27/2013)  . Migraine     "used to have them bad; haven't had one in awhile" (05/27/2013)  . Gout attack     "not too long ago; in my right foot" (05/27/2013)  . Complete heart block     reason for pacemaker/notes 05/27/2013  . Atrial fibrillation     [name] 05/27/2013  . HLD (hyperlipidemia)   . GIB (gastrointestinal bleeding)   . BPH (benign prostatic hyperplasia)   . Presence of permanent cardiac pacemaker   . Hypertension    Past Surgical History  Procedure Laterality Date  . Insert / replace / remove pacemaker    . Appendectomy    . Cholecystectomy    . Open reduction of hip Right 05/28/2013    Procedure: OPEN REDUCTION OF HIP;  Surgeon: Renette Butters, MD;  Location: [place];  Service: Orthopedics;  Laterality: Right;  . Esophagogastroduodenoscopy  (egd) with propofol N/A 09/26/2014    Procedure: ESOPHAGOGASTRODUODENOSCOPY (EGD) WITH PROPOFOL;  Surgeon: Jerene Bears, MD;  Location: [facility] ENDOSCOPY;  Service: Endoscopy;  Laterality: N/A;  . Colonoscopy N/A 10/01/2014    Procedure: COLONOSCOPY;  Surgeon: Jerene Bears, MD;  Location: [facility] ENDOSCOPY;  Service: Endoscopy;  Laterality: N/A;  . Partial colectomy N/A 10/05/2014    Procedure: SUBTOTAL COLECTOMY WITH ILEORECTAL ANASTOMOSIS;  Surgeon: Donnie Mesa, MD;  Location: [place];  Service: General;  Laterality: N/A;  . Flexible sigmoidoscopy N/A 11/07/2014    Procedure: FLEXIBLE SIGMOIDOSCOPY;  Surgeon: Jerene Bears, MD;  Location: [facility] ENDOSCOPY;  Service: Endoscopy;  Laterality: N/A;   Family History  Problem Relation Age of Onset  . Colon cancer Mother    History  Substance Use Topics  . Smoking status: Current Some Day Smoker -- 0.50 packs/day for 66 years    Types: Cigarettes  . Smokeless tobacco: Former Systems developer    Types: Snuff, Chew     Comment: 05/27/2013 "haven't used chew or snuff in ~ 30 yr"  . Alcohol Use: No     Comment: 05/27/2013 "none in years; never had problem w/it" - not in 30 years    Review of Systems  Constitutional: Negative for fever and chills.  HENT: Negative for congestion.   Eyes: Negative for visual disturbance.  Respiratory: Negative for shortness of breath.   Cardiovascular: Positive for chest pain.  Gastrointestinal: Negative for vomiting and abdominal pain.  Genitourinary: Negative for dysuria and flank pain.  Musculoskeletal:  Negative for back pain, neck pain and neck stiffness.  Skin: Negative for rash.  Neurological: Negative for light-headedness and headaches.      Allergies  Review of patient's allergies indicates no known allergies.  Home Medications   Prior to Admission medications   Medication Sig Start Date End Date Taking? Authorizing Provider  Calcium Carbonate-Vit D-Min (CALTRATE 600+D PLUS MINERALS) 600-800 MG-UNIT TABS Take 1 tablet by  mouth daily.    Historical Provider, MD  carvedilol (COREG) 6.25 MG tablet Take 1 tablet (6.25 mg total) by mouth 2 (two) times daily with a meal. 11/16/14   Velvet Bathe, MD  finasteride (PROSCAR) 5 MG tablet Take 5 mg by mouth daily.    Historical Provider, MD  HYDROcodone-acetaminophen (NORCO/VICODIN) 5-325 MG per tablet Take 1 tablet by mouth 2 (two) times daily as needed for severe pain. 12/05/14   Everlene Balls, MD  Lactobacillus-Inulin ([place]) CAPS Take 1 capsule by mouth 3 (three) times daily. Patient not taking: Reported on 12/12/2014 12/12/14   April Palumbo, MD  loperamide (IMODIUM A-D) 2 MG tablet Take 2 mg by mouth 4 (four) times daily as needed for diarrhea or loose stools.    Historical Provider, MD  magnesium oxide (MAG-OX) 400 MG tablet Take 400 mg by mouth 2 (two) times daily.    Historical Provider, MD  pravastatin (PRAVACHOL) 10 MG tablet Take 10 mg by mouth at bedtime.    Historical Provider, MD  sertraline (ZOLOFT) 50 MG tablet Take 50 mg by mouth every morning.     Historical Provider, MD  traMADol (ULTRAM) 50 MG tablet Take 50 mg by mouth every 6 (six) hours as needed for moderate pain.    Historical Provider, MD   BP 119/52 mmHg  Pulse 72  Temp(Src) 97.7 F (36.5 C) (Oral)  Resp 20  SpO2 96% Physical Exam  Constitutional: He is oriented to person, place, and time. No distress (cachectic).  HENT:  Head: Normocephalic and atraumatic.  Mild dry mucous membranes  Eyes: Right eye exhibits no discharge. Left eye exhibits no discharge.  Neck: Normal range of motion. Neck supple. No tracheal deviation present.  Cardiovascular: Normal rate and regular rhythm.   Pulmonary/Chest: Effort normal and breath sounds normal.  Abdominal: Soft. He exhibits no distension. There is no tenderness. There is no guarding.  Musculoskeletal: He exhibits no edema.  Neurological: He is alert and oriented to person, place, and time.  Skin: Skin is warm. No rash noted.  Patient  had diarrhea episode on arrival, stool on his buttocks and legs nonbloody  Psychiatric:  Patient has mild slow responses, intellectual disability  Nursing note and vitals reviewed.   ED Course  Procedures (including critical care time) Labs Review Labs Reviewed  BASIC METABOLIC PANEL - Abnormal; Notable for the following:    Sodium 133 (*)    Calcium 8.8 (*)    All other components within normal limits  CBC WITH DIFFERENTIAL/PLATELET - Abnormal; Notable for the following:    RBC 3.74 (*)    Hemoglobin 11.9 (*)    HCT 36.7 (*)    RDW 16.5 (*)    All other components within normal limits  TROPONIN I    Imaging Review Dg Chest Portable 1 View  01/02/2015   CLINICAL DATA:  Chest pain  EXAM: PORTABLE CHEST - 1 VIEW  COMPARISON:  12/26/2014  FINDINGS: Dual lead right-sided pacer in place. Heart size mildly enlarged. Lungs are hyperaerated suggesting emphysema. Presumed nipple shadow over the left lung  base reidentified. Right upper lobe granuloma is stable. No new focal pulmonary opacity or pleural effusion.  IMPRESSION: Mild cardiomegaly with changes of emphysema reidentified.   Electronically Signed   By: Conchita Paris M.D.   On: 01/02/2015 12:45     EKG Interpretation   Date/Time:  Monday January 02 2015 12:26:50 EDT Ventricular Rate:  91 PR Interval:    QRS Duration: 105 QT Interval:  401 QTC Calculation: 493 R Axis:   102 Text Interpretation:  Atrial fibrillation Anterolateral infarct, old  similar to previous Confirmed by Shelisha Gautier  MD, Lashell Moffitt ([id number]) on 01/02/2015  12:42:19 PM      MDM   Final diagnoses:  Chest pain, unspecified chest pain type   Patient presents with atypical constant chest pain for months, difficulty with details due to intellectual disability. Plan for cardiac screen, vitals, portal chest x-ray. Patient had large bowel movement in the bed and all of the floor on arrival, patient says this happens routinely. Assisted the patient to clean and  housekeeping will be called.  Chest pain improved in the ER, patient walking around asking to leave he was to go outside for cigarette. Screening cardiac workup unremarkable, atypical pain patient has had for months. Patient currently stable for outpatient follow-up vitals normal.   Medications  aspirin chewable tablet 324 mg (324 mg Oral Not Given 01/02/15 1230)  HYDROcodone-acetaminophen (NORCO/VICODIN) 5-325 MG per tablet 2 tablet (2 tablets Oral Given 01/02/15 1425)    Filed Vitals:   01/02/15 1229 01/02/15 1427  BP: 131/61 119/52  Pulse: 78 72  Temp: 97.7 F (36.5 C)   TempSrc: Oral   Resp: 16 20  SpO2: 96% 96%    Final diagnoses:  Chest pain, unspecified chest pain type       Elnora Morrison, MD 01/02/15 1459

## 2015-01-02 NOTE — ED Notes (Signed)
Pt arrived by [facility] from side of the road with c/o cp. Denies any dizziness, sob, and n/v. Pt cp is the left side of chest and dull. Pain 3/10 with EMS. Pt was recently seen in ED.

## 2015-01-03 NOTE — Progress Notes (Signed)
No show  This encounter was created in error - please disregard.

## 2015-01-09 ENCOUNTER — Encounter: Payer: Self-pay | Admitting: Internal Medicine

## 2015-01-09 ENCOUNTER — Telehealth: Payer: Self-pay | Admitting: Hematology

## 2015-01-09 NOTE — Telephone Encounter (Signed)
pt called to r/s missed appt....done.Marland Kitchen..Marland Kitchenok and aware of new d.t

## 2015-01-15 ENCOUNTER — Emergency Department (HOSPITAL_COMMUNITY): Payer: Medicare Other

## 2015-01-15 ENCOUNTER — Encounter (HOSPITAL_COMMUNITY): Payer: Self-pay | Admitting: Emergency Medicine

## 2015-01-15 ENCOUNTER — Emergency Department (HOSPITAL_COMMUNITY)
Admission: EM | Admit: 2015-01-15 | Discharge: 2015-01-15 | Disposition: A | Discharge status: Home / Self Care | Payer: Medicare Other | Attending: Emergency Medicine | Admitting: Emergency Medicine

## 2015-01-15 DIAGNOSIS — Z79899 Other long term (current) drug therapy: Secondary | ICD-10-CM | POA: Insufficient documentation

## 2015-01-15 DIAGNOSIS — I10 Essential (primary) hypertension: Secondary | ICD-10-CM | POA: Insufficient documentation

## 2015-01-15 DIAGNOSIS — G43909 Migraine, unspecified, not intractable, without status migrainosus: Secondary | ICD-10-CM | POA: Insufficient documentation

## 2015-01-15 DIAGNOSIS — R197 Diarrhea, unspecified: Secondary | ICD-10-CM | POA: Insufficient documentation

## 2015-01-15 DIAGNOSIS — Z720 Tobacco use: Secondary | ICD-10-CM | POA: Insufficient documentation

## 2015-01-15 DIAGNOSIS — F329 Major depressive disorder, single episode, unspecified: Secondary | ICD-10-CM | POA: Diagnosis not present

## 2015-01-15 DIAGNOSIS — I4891 Unspecified atrial fibrillation: Secondary | ICD-10-CM | POA: Insufficient documentation

## 2015-01-15 DIAGNOSIS — E785 Hyperlipidemia, unspecified: Secondary | ICD-10-CM | POA: Insufficient documentation

## 2015-01-15 DIAGNOSIS — R1031 Right lower quadrant pain: Secondary | ICD-10-CM | POA: Diagnosis present

## 2015-01-15 DIAGNOSIS — Z87438 Personal history of other diseases of male genital organs: Secondary | ICD-10-CM | POA: Insufficient documentation

## 2015-01-15 DIAGNOSIS — R109 Unspecified abdominal pain: Secondary | ICD-10-CM | POA: Diagnosis not present

## 2015-01-15 DIAGNOSIS — R52 Pain, unspecified: Secondary | ICD-10-CM

## 2015-01-15 DIAGNOSIS — Z8719 Personal history of other diseases of the digestive system: Secondary | ICD-10-CM | POA: Insufficient documentation

## 2015-01-15 DIAGNOSIS — Z950 Presence of cardiac pacemaker: Secondary | ICD-10-CM | POA: Insufficient documentation

## 2015-01-15 DIAGNOSIS — G8929 Other chronic pain: Secondary | ICD-10-CM

## 2015-01-15 DIAGNOSIS — Z9049 Acquired absence of other specified parts of digestive tract: Secondary | ICD-10-CM | POA: Diagnosis not present

## 2015-01-15 LAB — COMPREHENSIVE METABOLIC PANEL
ALBUMIN: 3.5 g/dL (ref 3.5–5.0)
ALT: 13 U/L — AB (ref 17–63)
AST: 19 U/L (ref 15–41)
Alkaline Phosphatase: 129 U/L — ABNORMAL HIGH (ref 38–126)
Anion gap: 9 (ref 5–15)
BILIRUBIN TOTAL: 0.3 mg/dL (ref 0.3–1.2)
BUN: 28 mg/dL — ABNORMAL HIGH (ref 6–20)
CO2: 26 mmol/L (ref 22–32)
CREATININE: 0.71 mg/dL (ref 0.61–1.24)
Calcium: 9 mg/dL (ref 8.9–10.3)
Chloride: 103 mmol/L (ref 101–111)
GFR calc Af Amer: >60 mL/min (ref >60)
GFR calc non Af Amer: >60 mL/min (ref >60)
Glucose, Bld: 111 mg/dL — ABNORMAL HIGH (ref 65–99)
Potassium: 3.6 mmol/L (ref 3.5–5.1)
SODIUM: 138 mmol/L (ref 135–145)
TOTAL PROTEIN: 7.3 g/dL (ref 6.5–8.1)

## 2015-01-15 LAB — CBC
HCT: 35.3 % — ABNORMAL LOW (ref 39.0–52.0)
Hemoglobin: 11 g/dL — ABNORMAL LOW (ref 13.0–17.0)
MCH: 30 pg (ref 26.0–34.0)
MCHC: 31.2 g/dL (ref 30.0–36.0)
MCV: 96.2 fL (ref 78.0–100.0)
Platelets: 148 10*3/uL — ABNORMAL LOW (ref 150–400)
RBC: 3.67 MIL/uL — ABNORMAL LOW (ref 4.22–5.81)
RDW: 16.2 % — ABNORMAL HIGH (ref 11.5–15.5)
WBC: 7.6 10*3/uL (ref 4.0–10.5)

## 2015-01-15 LAB — URINALYSIS, ROUTINE W REFLEX MICROSCOPIC
BILIRUBIN URINE: NEGATIVE
Glucose, UA: NEGATIVE mg/dL
Hgb urine dipstick: NEGATIVE
Ketones, ur: NEGATIVE mg/dL
Leukocytes, UA: NEGATIVE
Nitrite: NEGATIVE
PH: 5 (ref 5.0–8.0)
Protein, ur: NEGATIVE mg/dL
Specific Gravity, Urine: 1.022 (ref 1.005–1.030)
UROBILINOGEN UA: 0.2 mg/dL (ref 0.0–1.0)

## 2015-01-15 LAB — LIPASE, BLOOD: Lipase: 49 U/L (ref 22–51)

## 2015-01-15 MED ORDER — ALIGN 4 MG PO CAPS: 1.0000 | ORAL_CAPSULE | Freq: Four times a day (QID) | ORAL | Status: DC

## 2015-01-15 MED ORDER — GI COCKTAIL ~~LOC~~
30.0000 mL | Freq: Once | ORAL | Status: AC
  Administered 2015-01-15: 30 mL via ORAL
  Filled 2015-01-15: qty 30

## 2015-01-15 NOTE — ED Notes (Signed)
Pt made aware of need for urine sample, given urinal. Will alert staff when he has sample.

## 2015-01-15 NOTE — ED Notes (Signed)
Bed: WA03 Expected date:  Expected time:  Means of arrival:  Comments: 66 M abd pain

## 2015-01-15 NOTE — ED Provider Notes (Signed)
CSN: [id number]     Arrival date & time 01/15/15  0020 History  This chart was scribed for Aramis Weil, MD by Hansel Feinstein, ED Scribe. This patient was seen in room WA03/WA03 and the patient's care was started at 12:58 AM.     Chief Complaint  Patient presents with  . Abdominal Pain   Patient is a 69 y.o. male presenting with abdominal pain. The history is provided by the patient. No language interpreter was used.  Abdominal Pain Pain location:  RLQ and LLQ Pain quality: cramping   Pain radiates to:  Does not radiate Pain severity:  Moderate Onset quality:  Gradual Duration:  1 day Timing:  Constant Progression:  Unchanged Chronicity:  New Context: previous surgery   Context: not suspicious food intake   Relieved by:  Nothing Worsened by:  Nothing tried Ineffective treatments:  None tried Associated symptoms: diarrhea   Associated symptoms: no constipation, no dysuria, no fever, no nausea and no vomiting     HPI Comments: [name] is a 69 y.o. male with Hx of depression, pacemaker, Afib, HLD, GIB, BPH, HTN who presents to the Emergency Department complaining of moderate, constant gradual onset lower abdominal pain onset tonight. Pt states associated diarrhea that is baseline for him. He states pain at the site of a surgical incision 2 years ago. Pt notes no modifying factors. Pt was seen in the ED on 01/02/15, 12/28/14, 12/26/14, 12/15/14 for CP, on 12/29/14 for abdominal pain, as well as 22 other visits in the past 6 months. He denies new foods, previous issues with his surgical scar. He also denies constipation, dysuria, nausea, vomiting, fever, activity changes. Pt lives in a nursing home.   Past Medical History  Diagnosis Date  . Depression   . Hypomagnesemia   . Fall at nursing home 05/27/2013    "slipped on water; said I broke my right hip" (05/27/2013)  . Migraine     "used to have them bad; haven't had one in awhile" (05/27/2013)  . Gout attack     "not too long ago; in my  right foot" (05/27/2013)  . Complete heart block     reason for pacemaker/notes 05/27/2013  . Atrial fibrillation     [name] 05/27/2013  . HLD (hyperlipidemia)   . GIB (gastrointestinal bleeding)   . BPH (benign prostatic hyperplasia)   . Presence of permanent cardiac pacemaker   . Hypertension    Past Surgical History  Procedure Laterality Date  . Insert / replace / remove pacemaker    . Appendectomy    . Cholecystectomy    . Open reduction of hip Right 05/28/2013    Procedure: OPEN REDUCTION OF HIP;  Surgeon: Renette Butters, MD;  Location: [place];  Service: Orthopedics;  Laterality: Right;  . Esophagogastroduodenoscopy (egd) with propofol N/A 09/26/2014    Procedure: ESOPHAGOGASTRODUODENOSCOPY (EGD) WITH PROPOFOL;  Surgeon: Jerene Bears, MD;  Location: [facility] ENDOSCOPY;  Service: Endoscopy;  Laterality: N/A;  . Colonoscopy N/A 10/01/2014    Procedure: COLONOSCOPY;  Surgeon: Jerene Bears, MD;  Location: [facility] ENDOSCOPY;  Service: Endoscopy;  Laterality: N/A;  . Partial colectomy N/A 10/05/2014    Procedure: SUBTOTAL COLECTOMY WITH ILEORECTAL ANASTOMOSIS;  Surgeon: Donnie Mesa, MD;  Location: [place];  Service: General;  Laterality: N/A;  . Flexible sigmoidoscopy N/A 11/07/2014    Procedure: FLEXIBLE SIGMOIDOSCOPY;  Surgeon: Jerene Bears, MD;  Location: [facility] ENDOSCOPY;  Service: Endoscopy;  Laterality: N/A;   Family History  Problem Relation  Age of Onset  . Colon cancer Mother    History  Substance Use Topics  . Smoking status: Current Some Day Smoker -- 0.50 packs/day for 66 years    Types: Cigarettes  . Smokeless tobacco: Former Systems developer    Types: Snuff, Chew     Comment: 05/27/2013 "haven't used chew or snuff in ~ 30 yr"  . Alcohol Use: No     Comment: 05/27/2013 "none in years; never had problem w/it" - not in 30 years    Review of Systems  Constitutional: Negative for fever and activity change.  Gastrointestinal: Positive for abdominal pain and diarrhea. Negative for nausea, vomiting  and constipation.  Genitourinary: Negative for dysuria.  All other systems reviewed and are negative.   Allergies  Review of patient's allergies indicates no known allergies.  Home Medications   Prior to Admission medications   Medication Sig Start Date End Date Taking? Authorizing Provider  Calcium Carbonate-Vit D-Min (CALTRATE 600+D PLUS MINERALS) 600-800 MG-UNIT TABS Take 1 tablet by mouth daily.    Historical Provider, MD  carvedilol (COREG) 6.25 MG tablet Take 1 tablet (6.25 mg total) by mouth 2 (two) times daily with a meal. 11/16/14   Velvet Bathe, MD  finasteride (PROSCAR) 5 MG tablet Take 5 mg by mouth daily.    Historical Provider, MD  HYDROcodone-acetaminophen (NORCO/VICODIN) 5-325 MG per tablet Take 1 tablet by mouth 2 (two) times daily as needed for severe pain. 12/05/14   Everlene Balls, MD  Lactobacillus-Inulin ([place]) CAPS Take 1 capsule by mouth 3 (three) times daily. Patient not taking: Reported on 12/12/2014 12/12/14   Chianne Byrns, MD  loperamide (IMODIUM A-D) 2 MG tablet Take 2 mg by mouth 4 (four) times daily as needed for diarrhea or loose stools.    Historical Provider, MD  magnesium oxide (MAG-OX) 400 MG tablet Take 400 mg by mouth 2 (two) times daily.    Historical Provider, MD  pravastatin (PRAVACHOL) 10 MG tablet Take 10 mg by mouth at bedtime.    Historical Provider, MD  sertraline (ZOLOFT) 50 MG tablet Take 50 mg by mouth every morning.     Historical Provider, MD  traMADol (ULTRAM) 50 MG tablet Take 50 mg by mouth every 6 (six) hours as needed for moderate pain.    Historical Provider, MD   BP 128/54 mmHg  Pulse 91  Temp(Src) 98.9 F (37.2 C) (Oral)  Resp 19  SpO2 100% Physical Exam  Constitutional: He is oriented to person, place, and time. He appears well-developed and well-nourished.  HENT:  Head: Normocephalic and atraumatic.  Mouth/Throat: Uvula is midline and oropharynx is clear and moist.  Eyes: Conjunctivae and EOM are normal.  Pupils are equal, round, and reactive to light.  Neck: Trachea normal and normal range of motion. Neck supple. No tracheal deviation present.  Cardiovascular: Normal rate, regular rhythm and intact distal pulses.   Pulmonary/Chest: Effort normal. No respiratory distress. He has no wheezes. He has no rales.  Lungs CTA.   Abdominal: Soft. He exhibits no distension and no mass. There is no tenderness. There is no rebound and no guarding.  Hyperactive bowel sounds throughout. Incision clean, dry, intact. No hernias  Musculoskeletal: Normal range of motion.  Neurological: He is alert and oriented to person, place, and time. He has normal reflexes.  Skin: Skin is warm and dry.  Psychiatric: He has a normal mood and affect. His behavior is normal.  Nursing note and vitals reviewed.   ED Course  Procedures (  including critical care time) DIAGNOSTIC STUDIES: Oxygen Saturation is 100% on RA, normal by my interpretation.    COORDINATION OF CARE: 1:02 AM Discussed treatment plan with pt at bedside and pt agreed to plan.    Labs Review Labs Reviewed  LIPASE, BLOOD  COMPREHENSIVE METABOLIC PANEL  CBC  URINALYSIS, ROUTINE W REFLEX MICROSCOPIC (NOT AT [facility])    Imaging Review No results found.   EKG Interpretation None      MDM   Final diagnoses:  None    Chronic pain.  Will treat with probiotics.  Follow up with your PMD   I personally performed the services described in this documentation, which was scribed in my presence. The recorded information has been reviewed and is accurate.    Veatrice Kells, MD 01/15/15 [phone]

## 2015-01-15 NOTE — ED Notes (Signed)
Pt presents via EMS for mid lower abdominal pain. Denies N/V/D.  Last VS 132/68, 78hr, 16resp, [id number]

## 2015-01-19 ENCOUNTER — Other Ambulatory Visit: Payer: Self-pay | Admitting: General Surgery

## 2015-01-19 NOTE — H&P (Signed)
[name] 01/19/2015 10:13 AM Location: [facility] Patient #: [id number] DOB: 1945/08/15 Divorced / Language: [name] / Race: White Male History of Present Illness Damon Ruff MD; 4/58/0998 10:50 AM) The patient is a 69 year old male who presents with a colorectal polyp. 69 year old male who underwent a total abdominal colectomy by Dr. Georgette Goodwin in April. At that time, he had a rectal polyp which was excised endoscopically. Repeat colonoscopy showed recurrence. 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 He was referred to me for possible transanal excision. 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 Proscar (5MG  Tablet, Oral) Active. Pravastatin Sodium (10MG  Tablet, Oral) Active. Sertraline HCl (50MG  Tablet, Oral) Active. Calcium "900" w/D (Oral) Active. Medications Reconciled  Social History Damon Ruff, MD; 2/99/2426 10:50 AM) Caffeine use Carbonated beverages. Alcohol use Moderate alcohol use. Illicit drug use Remotely quit drug use. Tobacco use Current every day smoker.  Family History Damon Ruff, MD; 8/34/1962 10:50 AM) First Degree Relatives No pertinent family history     Review of Systems Damon Ruff MD; 2/29/7989 10:50 AM) General Present- Weight Loss. Not Present- Appetite Loss, Chills, Fatigue, Fever, Night Sweats and Weight Gain. Skin Not Present- Change in  Wart/Mole, Dryness, Hives, Jaundice, New Lesions, Non-Healing Wounds, Rash and Ulcer. HEENT Not Present- Earache, Hearing Loss, Hoarseness, Nose Bleed, Oral Ulcers, Ringing in the Ears, Seasonal Allergies, Sinus Pain, Sore Throat, Visual Disturbances, Wears glasses/contact lenses and Yellow Eyes. Respiratory Present- Chronic Cough. Not Present- Bloody sputum, Difficulty Breathing, Snoring and Wheezing. Breast Not Present- Breast Mass, Breast Pain, Nipple Discharge and Skin Changes. Cardiovascular Present- Chest Pain, Leg Cramps, Rapid Heart Rate and Swelling of Extremities. Not Present- Difficulty Breathing Lying Down, Palpitations and Shortness of Breath. Gastrointestinal Present- Abdominal Pain, Bloating, Bloody Stool and Chronic diarrhea. Not Present- Change in Bowel Habits, Constipation, Difficulty Swallowing, Excessive gas, Gets full quickly at meals, Hemorrhoids, Indigestion, Nausea, Rectal Pain and Vomiting. Male Genitourinary Present- Frequency. Not Present- Blood in Urine, Change in Urinary Stream, Impotence, Nocturia, Painful Urination, Urgency and Urine Leakage. Musculoskeletal Present- Back Pain and Swelling of Extremities. Not Present- Joint Pain, Joint Stiffness, Muscle Pain and Muscle Weakness. Neurological Not Present- Decreased Memory, Fainting, Headaches, Numbness, Seizures, Tingling, Tremor, Trouble walking and Weakness. Psychiatric Not Present- Anxiety, Bipolar, Change in Sleep Pattern, Depression, Fearful and Frequent crying. Endocrine Present- Excessive Hunger. Not Present- Cold Intolerance, Hair Changes, Heat Intolerance and New Diabetes. Hematology Present- Easy Bruising. Not Present- Excessive bleeding, Gland problems, HIV and Persistent Infections.  Vitals [name] CMA; 01/19/2015 10:15 AM) 01/19/2015 10:14 AM Weight: 104 lb Height: 69in Body Surface Area: 1.52 m Body Mass Index: 15.36 kg/m Temp.: 98.71F(Oral)  Pulse: 70 (Regular)  BP: 126/78 (Sitting,  Left Arm, Standard)     Physical Exam Damon Ruff MD; 07/22/9415 10:51 AM)  The physical exam findings are as follows: Note:Thin Male in NAD HEENT: EOMI, sclera anicteric Neck: No masses, no thyromegaly Lungs: CTA bilaterally; normal respiratory effort CV: Regular rate and rhythm; no murmurs Abd: +bowel sounds, soft, non-tender, no masses; well-healed  midline incision Rectal: Posterior palpable mass noted in mid rectum Ext: Well-perfused; no edema Skin: Warm, dry; no sign of jaundice    Assessment & Plan Damon Ruff MD; 08/03/4973 10:52 AM)  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 Follow-up colonoscopy shows recurrence of rectal polyp. On exam the polyp is posterior and mobile. I think it could be resected easily using a TAMIS procedure. I discussed this with him. I recommended an overnight stay in the hospital. We discussed that if the tumor does indeed have cancer, we would need to discuss further resection. Risks of surgery include anal pain, bleeding, infection and anastomotic leak.

## 2015-01-21 ENCOUNTER — Emergency Department (HOSPITAL_COMMUNITY)
Admission: EM | Admit: 2015-01-21 | Discharge: 2015-01-21 | Payer: Medicare Other | Attending: Emergency Medicine | Admitting: Emergency Medicine

## 2015-01-21 ENCOUNTER — Encounter (HOSPITAL_COMMUNITY): Payer: Self-pay

## 2015-01-21 ENCOUNTER — Emergency Department (HOSPITAL_COMMUNITY): Payer: Medicare Other

## 2015-01-21 DIAGNOSIS — N400 Enlarged prostate without lower urinary tract symptoms: Secondary | ICD-10-CM | POA: Diagnosis not present

## 2015-01-21 DIAGNOSIS — Z720 Tobacco use: Secondary | ICD-10-CM | POA: Insufficient documentation

## 2015-01-21 DIAGNOSIS — Z8719 Personal history of other diseases of the digestive system: Secondary | ICD-10-CM | POA: Insufficient documentation

## 2015-01-21 DIAGNOSIS — I4891 Unspecified atrial fibrillation: Secondary | ICD-10-CM | POA: Diagnosis not present

## 2015-01-21 DIAGNOSIS — E785 Hyperlipidemia, unspecified: Secondary | ICD-10-CM | POA: Insufficient documentation

## 2015-01-21 DIAGNOSIS — I10 Essential (primary) hypertension: Secondary | ICD-10-CM | POA: Insufficient documentation

## 2015-01-21 DIAGNOSIS — R0789 Other chest pain: Secondary | ICD-10-CM | POA: Diagnosis not present

## 2015-01-21 DIAGNOSIS — B86 Scabies: Secondary | ICD-10-CM | POA: Diagnosis not present

## 2015-01-21 DIAGNOSIS — Z950 Presence of cardiac pacemaker: Secondary | ICD-10-CM | POA: Diagnosis not present

## 2015-01-21 DIAGNOSIS — F329 Major depressive disorder, single episode, unspecified: Secondary | ICD-10-CM | POA: Diagnosis not present

## 2015-01-21 DIAGNOSIS — R079 Chest pain, unspecified: Secondary | ICD-10-CM | POA: Diagnosis present

## 2015-01-21 DIAGNOSIS — Z79899 Other long term (current) drug therapy: Secondary | ICD-10-CM | POA: Insufficient documentation

## 2015-01-21 LAB — CBC
HEMATOCRIT: 35 % — AB (ref 39.0–52.0)
HEMOGLOBIN: 11.1 g/dL — AB (ref 13.0–17.0)
MCH: 30.9 pg (ref 26.0–34.0)
MCHC: 31.7 g/dL (ref 30.0–36.0)
MCV: 97.5 fL (ref 78.0–100.0)
Platelets: 157 10*3/uL (ref 150–400)
RBC: 3.59 MIL/uL — AB (ref 4.22–5.81)
RDW: 15.9 % — AB (ref 11.5–15.5)
WBC: 6.2 10*3/uL (ref 4.0–10.5)

## 2015-01-21 LAB — TROPONIN I: Troponin I: <0.03 ng/mL (ref <0.031)

## 2015-01-21 LAB — PROTIME-INR
INR: 1.09 (ref 0.00–1.49)
Prothrombin Time: 14.3 seconds (ref 11.6–15.2)

## 2015-01-21 LAB — BASIC METABOLIC PANEL
Anion gap: 10 (ref 5–15)
BUN: 19 mg/dL (ref 6–20)
CHLORIDE: 103 mmol/L (ref 101–111)
CO2: 25 mmol/L (ref 22–32)
CREATININE: 0.63 mg/dL (ref 0.61–1.24)
Calcium: 9 mg/dL (ref 8.9–10.3)
GFR calc Af Amer: >60 mL/min (ref >60)
Glucose, Bld: 134 mg/dL — ABNORMAL HIGH (ref 65–99)
Potassium: 3.6 mmol/L (ref 3.5–5.1)
Sodium: 138 mmol/L (ref 135–145)

## 2015-01-21 MED ORDER — PERMETHRIN 5 % EX CREA: TOPICAL_CREAM | CUTANEOUS | Status: DC

## 2015-01-21 MED ORDER — ACETAMINOPHEN 325 MG PO TABS
650.0000 mg | ORAL_TABLET | Freq: Once | ORAL | Status: AC
  Administered 2015-01-21: 650 mg via ORAL
  Filled 2015-01-21: qty 2

## 2015-01-21 NOTE — ED Provider Notes (Signed)
CSN: [id number]     Arrival date & time 01/21/15  0749 History   First MD Initiated Contact with Patient 01/21/15 [phone]     Chief Complaint  Patient presents with  . Chest Pain     (Consider location/radiation/quality/duration/timing/severity/associated sxs/prior Treatment) HPI Comments: Patient here complaining of left-sided sharp chest pain which is been present since 3:00 this morning. Pain is worse with movement and not associated with dyspnea, diaphoresis, syncope or near-syncope. History of similar symptoms in the past without an etiology. Does have a pacemaker but denies any issues with that. No leg pain or swelling. Patient is currently incarcerated. He also has had diffuse whole-body rash which is not pruritic and has been present for some time. Presents via Event organiser  Patient is a 69 y.o. male presenting with chest pain. The history is provided by the patient.  Chest Pain   Past Medical History  Diagnosis Date  . Depression   . Hypomagnesemia   . Fall at nursing home 05/27/2013    "slipped on water; said I broke my right hip" (05/27/2013)  . Migraine     "used to have them bad; haven't had one in awhile" (05/27/2013)  . Gout attack     "not too long ago; in my right foot" (05/27/2013)  . Complete heart block     reason for pacemaker/notes 05/27/2013  . Atrial fibrillation     [name] 05/27/2013  . HLD (hyperlipidemia)   . GIB (gastrointestinal bleeding)   . BPH (benign prostatic hyperplasia)   . Presence of permanent cardiac pacemaker   . Hypertension    Past Surgical History  Procedure Laterality Date  . Insert / replace / remove pacemaker    . Appendectomy    . Cholecystectomy    . Open reduction of hip Right 05/28/2013    Procedure: OPEN REDUCTION OF HIP;  Surgeon: Renette Butters, MD;  Location: [place];  Service: Orthopedics;  Laterality: Right;  . Esophagogastroduodenoscopy (egd) with propofol N/A 09/26/2014    Procedure: ESOPHAGOGASTRODUODENOSCOPY (EGD)  WITH PROPOFOL;  Surgeon: Jerene Bears, MD;  [facility] ENDOSCOPY;  Service: Endoscopy;  Laterality: N/A;  . Colonoscopy N/A 10/01/2014    Procedure: COLONOSCOPY;  Surgeon: Jerene Bears, MD;  Location: [facility] ENDOSCOPY;  Service: Endoscopy;  Laterality: N/A;  . Partial colectomy N/A 10/05/2014    Procedure: SUBTOTAL COLECTOMY WITH ILEORECTAL ANASTOMOSIS;  Surgeon: Donnie Mesa, MD;  Location: [place];  Service: General;  Laterality: N/A;  . Flexible sigmoidoscopy N/A 11/07/2014    Procedure: FLEXIBLE SIGMOIDOSCOPY;  Surgeon: Jerene Bears, MD;  Location: [facility] ENDOSCOPY;  Service: Endoscopy;  Laterality: N/A;   Family History  Problem Relation Age of Onset  . Colon cancer Mother    Social History  Substance Use Topics  . Smoking status: Current Some Day Smoker -- 0.50 packs/day for 66 years    Types: Cigarettes  . Smokeless tobacco: Former Systems developer    Types: Snuff, Chew     Comment: 05/27/2013 "haven't used chew or snuff in ~ 30 yr"  . Alcohol Use: No     Comment: 05/27/2013 "none in years; never had problem w/it" - not in 30 years    Review of Systems  Cardiovascular: Positive for chest pain.  All other systems reviewed and are negative.     Allergies  Review of patient's allergies indicates no known allergies.  Home Medications   Prior to Admission medications   Medication Sig Start Date End Date Taking? Authorizing Provider  Calcium Carbonate-Vit D-Min (CALTRATE 600+D PLUS MINERALS) 600-800 MG-UNIT TABS Take 1 tablet by mouth daily.    Historical Provider, MD  carvedilol (COREG) 6.25 MG tablet Take 1 tablet (6.25 mg total) by mouth 2 (two) times daily with a meal. 11/16/14   Velvet Bathe, MD  finasteride (PROSCAR) 5 MG tablet Take 5 mg by mouth daily.    Historical Provider, MD  HYDROcodone-acetaminophen (NORCO/VICODIN) 5-325 MG per tablet Take 1 tablet by mouth 2 (two) times daily as needed for severe pain. 12/05/14   Everlene Balls, MD  Lactobacillus-Inulin ([place]) CAPS  Take 1 capsule by mouth 3 (three) times daily. Patient not taking: Reported on 12/12/2014 12/12/14   April Palumbo, MD  loperamide (IMODIUM A-D) 2 MG tablet Take 2 mg by mouth 4 (four) times daily as needed for diarrhea or loose stools.    Historical Provider, MD  magnesium oxide (MAG-OX) 400 MG tablet Take 400 mg by mouth 2 (two) times daily.    Historical Provider, MD  pravastatin (PRAVACHOL) 10 MG tablet Take 10 mg by mouth at bedtime.    Historical Provider, MD  Probiotic Product (ALIGN) 4 MG CAPS Take 1 capsule by mouth 4 (four) times daily. 01/15/15   April Palumbo, MD  sertraline (ZOLOFT) 50 MG tablet Take 50 mg by mouth every morning.     Historical Provider, MD  traMADol (ULTRAM) 50 MG tablet Take 50 mg by mouth every 6 (six) hours as needed for moderate pain.    Historical Provider, MD   There were no vitals taken for this visit. Physical Exam  Constitutional: He is oriented to person, place, and time. He appears well-developed and well-nourished.  Non-toxic appearance. No distress.  HENT:  Head: Normocephalic and atraumatic.  Eyes: Conjunctivae, EOM and lids are normal. Pupils are equal, round, and reactive to light.  Neck: Normal range of motion. Neck supple. No tracheal deviation present. No thyroid mass present.  Cardiovascular: Normal rate, regular rhythm and normal heart sounds.  Exam reveals no gallop.   No murmur heard. Pulmonary/Chest: Effort normal and breath sounds normal. No stridor. No respiratory distress. He has no decreased breath sounds. He has no wheezes. He has no rhonchi. He has no rales. He exhibits bony tenderness. He exhibits no crepitus and no swelling.    Abdominal: Soft. Normal appearance and bowel sounds are normal. He exhibits no distension. There is no tenderness. There is no rebound and no CVA tenderness.  Musculoskeletal: Normal range of motion. He exhibits no edema or tenderness.  Neurological: He is alert and oriented to person, place, and time. He has  normal strength. No cranial nerve deficit or sensory deficit. GCS eye subscore is 4. GCS verbal subscore is 5. GCS motor subscore is 6.  Skin: Skin is warm and dry. Rash noted. No abrasion noted. Rash is maculopapular. Rash is not pustular.  Psychiatric: He has a normal mood and affect. His speech is normal and behavior is normal.  Nursing note and vitals reviewed.   ED Course  Procedures (including critical care time) Labs Review Labs Reviewed  BASIC METABOLIC PANEL  CBC  TROPONIN I    Imaging Review No results found. I, Leota Jacobsen, personally reviewed and evaluated these images and lab results as part of my medical decision-making.   EKG Interpretation   Date/Time:  Saturday January 21 2015 08:13:36 EDT Ventricular Rate:  85 PR Interval:    QRS Duration: 106 QT Interval:  391 QTC Calculation: 465 R Axis:   100  Text Interpretation:  Atrial fibrillation Probable anterior infarct, old  No significant change since last tracing Confirmed by Ajay Strubel  MD, Rob Mciver  ([id number]) on 01/21/2015 8:15:51 AM      MDM   Final diagnoses:  None    Patient to be for presumptive scabies. Patient's chest pain is likely from chest wall etiology. Do not think that this represents ACS or PE. Patient stable for discharge    Lacretia Leigh, MD 01/21/15 [phone]

## 2015-01-21 NOTE — ED Notes (Signed)
He comes from [facility] jail with two sheriff deputies in upper and lower extremity shackles.  CMS intact all digits bilat. With no skin issues noted.  Pt. Tells [place] he has had some "sharp" chest pain since 0300 today which persists.  He is in no distress with his skin being normal, warm and dry and he is breathing normally.  He further tells [place] he has a pacemaker inserted in 1991.  Dr. Zenia Resides is examining him as I write this.

## 2015-01-21 NOTE — Discharge Instructions (Signed)
Chest Wall Pain Chest wall pain is pain in or around the bones and muscles of your chest. It may take up to 6 weeks to get better. It may take longer if you must stay physically active in your work and activities.  CAUSES  Chest wall pain may happen on its own. However, it may be caused by:  A viral illness like the flu.  Injury.  Coughing.  Exercise.  Arthritis.  Fibromyalgia.  Shingles. HOME CARE INSTRUCTIONS   Avoid overtiring physical activity. Try not to strain or perform activities that cause pain. This includes any activities using your chest or your abdominal and side muscles, especially if heavy weights are used.  Put ice on the sore area.  Put ice in a plastic bag.  Place a towel between your skin and the bag.  Leave the ice on for 15-20 minutes per hour while awake for the first 2 days.  Only take over-the-counter or prescription medicines for pain, discomfort, or fever as directed by your caregiver. SEEK IMMEDIATE MEDICAL CARE IF:   Your pain increases, or you are very uncomfortable.  You have a fever.  Your chest pain becomes worse.  You have new, unexplained symptoms.  You have nausea or vomiting.  You feel sweaty or lightheaded.  You have a cough with phlegm (sputum), or you cough up blood. MAKE SURE YOU:   Understand these instructions.  Will watch your condition.  Will get help right away if you are not doing well or get worse. Document Released: 05/27/2005 Document Revised: 08/19/2011 Document Reviewed: 01/21/2011 [facility] Patient Information 2015 [place], [place]. This information is not intended to replace advice given to you by your health care provider. Make sure you discuss any questions you have with your health care provider. Scabies Scabies are small bugs (mites) that burrow under the skin and cause red bumps and severe itching. These bugs can only be seen with a microscope. Scabies are highly contagious. They can spread easily from  person to person by direct contact. They are also spread through sharing clothing or linens that have the scabies mites living in them. It is not unusual for an entire family to become infected through shared towels, clothing, or bedding.  HOME CARE INSTRUCTIONS   Your caregiver may prescribe a cream or lotion to kill the mites. If cream is prescribed, massage the cream into the entire body from the neck to the bottom of both feet. Also massage the cream into the scalp and face if your child is less than 79 year old. Avoid the eyes and mouth. Do not wash your hands after application.  Leave the cream on for 8 to 12 hours. Your child should bathe or shower after the 8 to 12 hour application period. Sometimes it is helpful to apply the cream to your child right before bedtime.  One treatment is usually effective and will eliminate approximately 95% of infestations. For severe cases, your caregiver may decide to repeat the treatment in 1 week. Everyone in your household should be treated with one application of the cream.  New rashes or burrows should not appear within 24 to 48 hours after successful treatment. However, the itching and rash may last for 2 to 4 weeks after successful treatment. Your caregiver may prescribe a medicine to help with the itching or to help the rash go away more quickly.  Scabies can live on clothing or linens for up to 3 days. All of your child's recently used clothing, towels, stuffed  toys, and bed linens should be washed in hot water and then dried in a dryer for at least 20 minutes on high heat. Items that cannot be washed should be enclosed in a plastic bag for at least 3 days.  To help relieve itching, bathe your child in a cool bath or apply cool washcloths to the affected areas.  Your child may return to school after treatment with the prescribed cream. SEEK MEDICAL CARE IF:   The itching persists longer than 4 weeks after treatment.  The rash spreads or becomes  infected. Signs of infection include red blisters or yellow-tan crust. Document Released: 05/27/2005 Document Revised: 08/19/2011 Document Reviewed: 10/05/2008 [facility] Patient Information 2015 [place], [place]. This information is not intended to replace advice given to you by your health care provider. Make sure you discuss any questions you have with your health care provider.

## 2015-01-24 ENCOUNTER — Emergency Department (HOSPITAL_COMMUNITY)
Admission: EM | Admit: 2015-01-24 | Discharge: 2015-01-24 | Disposition: A | Discharge status: Home / Self Care | Payer: Medicare Other | Attending: Emergency Medicine | Admitting: Emergency Medicine

## 2015-01-24 ENCOUNTER — Encounter (HOSPITAL_COMMUNITY): Payer: Self-pay | Admitting: *Deleted

## 2015-01-24 DIAGNOSIS — I10 Essential (primary) hypertension: Secondary | ICD-10-CM | POA: Diagnosis not present

## 2015-01-24 DIAGNOSIS — Z950 Presence of cardiac pacemaker: Secondary | ICD-10-CM | POA: Insufficient documentation

## 2015-01-24 DIAGNOSIS — Z79899 Other long term (current) drug therapy: Secondary | ICD-10-CM | POA: Diagnosis not present

## 2015-01-24 DIAGNOSIS — Z720 Tobacco use: Secondary | ICD-10-CM | POA: Insufficient documentation

## 2015-01-24 DIAGNOSIS — R1030 Lower abdominal pain, unspecified: Secondary | ICD-10-CM | POA: Diagnosis present

## 2015-01-24 DIAGNOSIS — Z8739 Personal history of other diseases of the musculoskeletal system and connective tissue: Secondary | ICD-10-CM | POA: Insufficient documentation

## 2015-01-24 DIAGNOSIS — I4891 Unspecified atrial fibrillation: Secondary | ICD-10-CM | POA: Diagnosis not present

## 2015-01-24 DIAGNOSIS — Z8719 Personal history of other diseases of the digestive system: Secondary | ICD-10-CM | POA: Insufficient documentation

## 2015-01-24 DIAGNOSIS — Z9181 History of falling: Secondary | ICD-10-CM | POA: Diagnosis not present

## 2015-01-24 DIAGNOSIS — F329 Major depressive disorder, single episode, unspecified: Secondary | ICD-10-CM | POA: Diagnosis not present

## 2015-01-24 DIAGNOSIS — N400 Enlarged prostate without lower urinary tract symptoms: Secondary | ICD-10-CM | POA: Diagnosis not present

## 2015-01-24 DIAGNOSIS — Z9049 Acquired absence of other specified parts of digestive tract: Secondary | ICD-10-CM | POA: Diagnosis not present

## 2015-01-24 DIAGNOSIS — E785 Hyperlipidemia, unspecified: Secondary | ICD-10-CM | POA: Diagnosis not present

## 2015-01-24 LAB — URINALYSIS, ROUTINE W REFLEX MICROSCOPIC
Bilirubin Urine: NEGATIVE
Glucose, UA: NEGATIVE mg/dL
Hgb urine dipstick: NEGATIVE
Ketones, ur: NEGATIVE mg/dL
Leukocytes, UA: NEGATIVE
Nitrite: NEGATIVE
Protein, ur: NEGATIVE mg/dL
Specific Gravity, Urine: 1.022 (ref 1.005–1.030)
Urobilinogen, UA: 0.2 mg/dL (ref 0.0–1.0)
pH: 6 (ref 5.0–8.0)

## 2015-01-24 LAB — CBC WITH DIFFERENTIAL/PLATELET
Basophils Absolute: 0 10*3/uL (ref 0.0–0.1)
Basophils Relative: 0 % (ref 0–1)
Eosinophils Absolute: 0.1 10*3/uL (ref 0.0–0.7)
Eosinophils Relative: 2 % (ref 0–5)
HCT: 36.8 % — ABNORMAL LOW (ref 39.0–52.0)
Hemoglobin: 11.4 g/dL — ABNORMAL LOW (ref 13.0–17.0)
Lymphocytes Relative: 36 % (ref 12–46)
Lymphs Abs: 1.9 10*3/uL (ref 0.7–4.0)
MCH: 30.6 pg (ref 26.0–34.0)
MCHC: 31 g/dL (ref 30.0–36.0)
MCV: 98.9 fL (ref 78.0–100.0)
Monocytes Absolute: 0.6 10*3/uL (ref 0.1–1.0)
Monocytes Relative: 11 % (ref 3–12)
Neutro Abs: 2.7 10*3/uL (ref 1.7–7.7)
Neutrophils Relative %: 51 % (ref 43–77)
Platelets: 148 10*3/uL — ABNORMAL LOW (ref 150–400)
RBC: 3.72 MIL/uL — ABNORMAL LOW (ref 4.22–5.81)
RDW: 15.9 % — ABNORMAL HIGH (ref 11.5–15.5)
WBC: 5.3 10*3/uL (ref 4.0–10.5)

## 2015-01-24 LAB — BASIC METABOLIC PANEL
Anion gap: 7 (ref 5–15)
BUN: 24 mg/dL — ABNORMAL HIGH (ref 6–20)
CO2: 26 mmol/L (ref 22–32)
Calcium: 9.1 mg/dL (ref 8.9–10.3)
Chloride: 109 mmol/L (ref 101–111)
Creatinine, Ser: 0.82 mg/dL (ref 0.61–1.24)
GFR calc Af Amer: >60 mL/min (ref >60)
GFR calc non Af Amer: >60 mL/min (ref >60)
Glucose, Bld: 116 mg/dL — ABNORMAL HIGH (ref 65–99)
Potassium: 4 mmol/L (ref 3.5–5.1)
Sodium: 142 mmol/L (ref 135–145)

## 2015-01-24 MED ORDER — FENTANYL CITRATE (PF) 100 MCG/2ML IJ SOLN
50.0000 ug | Freq: Once | INTRAMUSCULAR | Status: AC
  Administered 2015-01-24: 50 ug via INTRAVENOUS
  Filled 2015-01-24: qty 2

## 2015-01-24 NOTE — ED Notes (Signed)
Pt laying on side of road and passerby called 911.  [facility] arrival, pt sts he was tired and just laid down.  During transport pt sts he is having abdominal pain that started this morning.

## 2015-01-24 NOTE — ED Notes (Signed)
Bed: [facility] Expected date:  Expected time:  Means of arrival:  Comments: EMS- 69yo M, sleepy/possible abdominal pain

## 2015-01-24 NOTE — Discharge Instructions (Signed)

## 2015-01-25 ENCOUNTER — Encounter (HOSPITAL_COMMUNITY): Payer: Self-pay | Admitting: *Deleted

## 2015-01-25 ENCOUNTER — Emergency Department (HOSPITAL_COMMUNITY): Payer: Medicare Other

## 2015-01-25 ENCOUNTER — Emergency Department (HOSPITAL_COMMUNITY)
Admission: EM | Admit: 2015-01-25 | Discharge: 2015-01-25 | Disposition: A | Discharge status: Home / Self Care | Payer: Medicare Other | Attending: Emergency Medicine | Admitting: Emergency Medicine

## 2015-01-25 DIAGNOSIS — Z950 Presence of cardiac pacemaker: Secondary | ICD-10-CM | POA: Insufficient documentation

## 2015-01-25 DIAGNOSIS — E785 Hyperlipidemia, unspecified: Secondary | ICD-10-CM | POA: Diagnosis not present

## 2015-01-25 DIAGNOSIS — N400 Enlarged prostate without lower urinary tract symptoms: Secondary | ICD-10-CM | POA: Insufficient documentation

## 2015-01-25 DIAGNOSIS — I10 Essential (primary) hypertension: Secondary | ICD-10-CM | POA: Insufficient documentation

## 2015-01-25 DIAGNOSIS — Z79899 Other long term (current) drug therapy: Secondary | ICD-10-CM | POA: Insufficient documentation

## 2015-01-25 DIAGNOSIS — I4891 Unspecified atrial fibrillation: Secondary | ICD-10-CM | POA: Insufficient documentation

## 2015-01-25 DIAGNOSIS — Z720 Tobacco use: Secondary | ICD-10-CM | POA: Diagnosis not present

## 2015-01-25 DIAGNOSIS — R079 Chest pain, unspecified: Secondary | ICD-10-CM | POA: Diagnosis not present

## 2015-01-25 DIAGNOSIS — Z87828 Personal history of other (healed) physical injury and trauma: Secondary | ICD-10-CM | POA: Insufficient documentation

## 2015-01-25 DIAGNOSIS — Z8719 Personal history of other diseases of the digestive system: Secondary | ICD-10-CM | POA: Diagnosis not present

## 2015-01-25 DIAGNOSIS — Z8739 Personal history of other diseases of the musculoskeletal system and connective tissue: Secondary | ICD-10-CM | POA: Diagnosis not present

## 2015-01-25 DIAGNOSIS — F329 Major depressive disorder, single episode, unspecified: Secondary | ICD-10-CM | POA: Diagnosis not present

## 2015-01-25 LAB — CBC WITH DIFFERENTIAL/PLATELET
Basophils Absolute: 0 10*3/uL (ref 0.0–0.1)
Basophils Relative: 0 % (ref 0–1)
Eosinophils Absolute: 0.1 10*3/uL (ref 0.0–0.7)
Eosinophils Relative: 1 % (ref 0–5)
HEMATOCRIT: 35 % — AB (ref 39.0–52.0)
Hemoglobin: 11.2 g/dL — ABNORMAL LOW (ref 13.0–17.0)
LYMPHS PCT: 32 % (ref 12–46)
Lymphs Abs: 2 10*3/uL (ref 0.7–4.0)
MCH: 30.9 pg (ref 26.0–34.0)
MCHC: 32 g/dL (ref 30.0–36.0)
MCV: 96.7 fL (ref 78.0–100.0)
MONO ABS: 0.5 10*3/uL (ref 0.1–1.0)
MONOS PCT: 8 % (ref 3–12)
NEUTROS ABS: 3.7 10*3/uL (ref 1.7–7.7)
Neutrophils Relative %: 59 % (ref 43–77)
Platelets: 133 10*3/uL — ABNORMAL LOW (ref 150–400)
RBC: 3.62 MIL/uL — ABNORMAL LOW (ref 4.22–5.81)
RDW: 15.5 % (ref 11.5–15.5)
WBC: 6.2 10*3/uL (ref 4.0–10.5)

## 2015-01-25 LAB — BASIC METABOLIC PANEL
ANION GAP: 8 (ref 5–15)
BUN: 25 mg/dL — ABNORMAL HIGH (ref 6–20)
CALCIUM: 8.8 mg/dL — AB (ref 8.9–10.3)
CHLORIDE: 105 mmol/L (ref 101–111)
CO2: 23 mmol/L (ref 22–32)
CREATININE: 0.76 mg/dL (ref 0.61–1.24)
GFR calc Af Amer: >60 mL/min (ref >60)
GFR calc non Af Amer: >60 mL/min (ref >60)
GLUCOSE: 96 mg/dL (ref 65–99)
Potassium: 3.8 mmol/L (ref 3.5–5.1)
Sodium: 136 mmol/L (ref 135–145)

## 2015-01-25 LAB — I-STAT TROPONIN, ED: Troponin i, poc: 0.01 ng/mL (ref 0.00–0.08)

## 2015-01-25 LAB — MAGNESIUM: MAGNESIUM: 1.8 mg/dL (ref 1.7–2.4)

## 2015-01-25 LAB — PHOSPHORUS: Phosphorus: 3.7 mg/dL (ref 2.5–4.6)

## 2015-01-25 MED ORDER — ASPIRIN 81 MG PO CHEW: 324.0000 mg | CHEWABLE_TABLET | Freq: Once | ORAL | Status: DC

## 2015-01-25 MED ORDER — SODIUM CHLORIDE 0.9 % IV BOLUS (SEPSIS)
1000.0000 mL | Freq: Once | INTRAVENOUS | Status: AC
  Administered 2015-01-25: 1000 mL via INTRAVENOUS

## 2015-01-25 MED ORDER — HYDROCODONE-ACETAMINOPHEN 5-325 MG PO TABS
1.0000 | ORAL_TABLET | Freq: Once | ORAL | Status: AC
  Administered 2015-01-25: 1 via ORAL
  Filled 2015-01-25: qty 1

## 2015-01-25 NOTE — ED Notes (Signed)
Patient given a warm blanket. 

## 2015-01-25 NOTE — ED Notes (Signed)
Patient transported to X-ray 

## 2015-01-25 NOTE — ED Notes (Signed)
Central sharp chest pain 9/10. Hx of pacemaker and afib. Pacemaker not firing.  Left [place] home today on bus, frequently leaves and takes the bus. Today went 'wrong direction.' Got off bus and was lying in shade under a tree, where fire dept found him, went to check on him and found him to be having CP.  AAO x 3, confused at times - unable to answer all questions appropriately, is baseline mental status.  324 of asa and 1 nitro en route. Did not affect pain.

## 2015-01-25 NOTE — ED Provider Notes (Signed)
CSN: [id number]     Arrival date & time 01/25/15  1441 History   First MD Initiated Contact with Patient 01/25/15 1454     Chief Complaint  Patient presents with  . Chest Pain     (Consider location/radiation/quality/duration/timing/severity/associated sxs/prior Treatment)  HPI  69 year old male who presents with chest pain. He has a history of atrial fibrillation not on anticoagulation, hypertension, hyperlipidemia, and complete heart block with a pacemaker. He has a history of chronic chest pain, and has had multiple ED visits for this. He reports onset of left-sided chest pain this morning at 2 AM. Pain is sharp, constant, nonradiating, nonpleuritic, and not associated with movement. The pain is not worsened by exertional activities. This is the same chest pain that he typically gets with his chronic chest pain, he reports. He was seen in the emergency department 4 days ago for the same chest pain, he reports.   Past Medical History  Diagnosis Date  . Depression   . Hypomagnesemia   . Fall at nursing home 05/27/2013    "slipped on water; said I broke my right hip" (05/27/2013)  . Migraine     "used to have them bad; haven't had one in awhile" (05/27/2013)  . Gout attack     "not too long ago; in my right foot" (05/27/2013)  . Complete heart block     reason for pacemaker/notes 05/27/2013  . Atrial fibrillation     [name] 05/27/2013  . HLD (hyperlipidemia)   . GIB (gastrointestinal bleeding)   . BPH (benign prostatic hyperplasia)   . Presence of permanent cardiac pacemaker   . Hypertension    Past Surgical History  Procedure Laterality Date  . Insert / replace / remove pacemaker    . Appendectomy    . Cholecystectomy    . Open reduction of hip Right 05/28/2013    Procedure: OPEN REDUCTION OF HIP;  Surgeon: Renette Butters, MD;  Location: [place];  Service: Orthopedics;  Laterality: Right;  . Esophagogastroduodenoscopy (egd) with propofol N/A 09/26/2014    Procedure:  ESOPHAGOGASTRODUODENOSCOPY (EGD) WITH PROPOFOL;  Surgeon: Jerene Bears, MD;  Location: [facility] ENDOSCOPY;  Service: Endoscopy;  Laterality: N/A;  . Colonoscopy N/A 10/01/2014    Procedure: COLONOSCOPY;  Surgeon: Jerene Bears, MD;  Location: [facility] ENDOSCOPY;  Service: Endoscopy;  Laterality: N/A;  . Partial colectomy N/A 10/05/2014    Procedure: SUBTOTAL COLECTOMY WITH ILEORECTAL ANASTOMOSIS;  Surgeon: Donnie Mesa, MD;  Location: [place];  Service: General;  Laterality: N/A;  . Flexible sigmoidoscopy N/A 11/07/2014    Procedure: FLEXIBLE SIGMOIDOSCOPY;  Surgeon: Jerene Bears, MD;  Location: [facility] ENDOSCOPY;  Service: Endoscopy;  Laterality: N/A;   Family History  Problem Relation Age of Onset  . Colon cancer Mother    Social History  Substance Use Topics  . Smoking status: Current Some Day Smoker -- 0.50 packs/day for 66 years    Types: Cigarettes  . Smokeless tobacco: Former Systems developer    Types: Snuff, Chew     Comment: 05/27/2013 "haven't used chew or snuff in ~ 30 yr"  . Alcohol Use: No     Comment: 05/27/2013 "none in years; never had problem w/it" - not in 30 years    Review of Systems  Constitutional: Negative for fever and chills.  HENT: Negative for congestion and rhinorrhea.   Respiratory: Negative for cough and shortness of breath.   Cardiovascular: Positive for chest pain. Negative for palpitations and leg swelling.  Gastrointestinal: Negative for nausea, vomiting,  abdominal pain and diarrhea.  Musculoskeletal: Negative for back pain.  Allergic/Immunologic: Negative for immunocompromised state.  Neurological: Negative for syncope and light-headedness.  Hematological: Does not bruise/bleed easily.  Psychiatric/Behavioral: Negative for confusion.  All other systems reviewed and are negative.     Allergies  Review of patient's allergies indicates no known allergies.  Home Medications   Prior to Admission medications   Medication Sig Start Date End Date Taking? Authorizing Provider   Calcium Carbonate-Vit D-Min (CALTRATE 600+D PLUS MINERALS) 600-800 MG-UNIT TABS Take 1 tablet by mouth daily.    Historical Provider, MD  carvedilol (COREG) 6.25 MG tablet Take 1 tablet (6.25 mg total) by mouth 2 (two) times daily with a meal. 11/16/14   Velvet Bathe, MD  finasteride (PROSCAR) 5 MG tablet Take 5 mg by mouth daily.    Historical Provider, MD  HYDROcodone-acetaminophen (NORCO/VICODIN) 5-325 MG per tablet Take 1 tablet by mouth 2 (two) times daily as needed for severe pain. 12/05/14   Everlene Balls, MD  Lactobacillus-Inulin ([place]) CAPS Take 1 capsule by mouth 3 (three) times daily. Patient not taking: Reported on 12/12/2014 12/12/14   April Palumbo, MD  loperamide (IMODIUM A-D) 2 MG tablet Take 2 mg by mouth 4 (four) times daily as needed for diarrhea or loose stools.    Historical Provider, MD  magnesium oxide (MAG-OX) 400 MG tablet Take 400 mg by mouth 2 (two) times daily.    Historical Provider, MD  permethrin (ELIMITE) 5 % cream Apply to affected area once 01/21/15   Lacretia Leigh, MD  pravastatin (PRAVACHOL) 10 MG tablet Take 10 mg by mouth at bedtime.    Historical Provider, MD  Probiotic Product (ALIGN) 4 MG CAPS Take 1 capsule by mouth 4 (four) times daily. 01/15/15   April Palumbo, MD  sertraline (ZOLOFT) 50 MG tablet Take 50 mg by mouth every morning.     Historical Provider, MD  traMADol (ULTRAM) 50 MG tablet Take 50 mg by mouth every 6 (six) hours as needed for moderate pain.    Historical Provider, MD   Pulse 160  Temp(Src) 98.1 F (36.7 C) (Oral)  Resp 19  SpO2 96% Physical Exam  Physical Exam  Nursing note and vitals reviewed. Constitutional: Well developed, well nourished, non-toxic, and in no acute distress Head: Normocephalic and atraumatic.  Mouth/Throat: Oropharynx is clear and moist.  Neck: Normal range of motion. Neck supple.  Cardiovascular: Normal rate and regular rhythm.  No edema. Normal distal capillary refill. Pulmonary/Chest: Effort  normal and breath sounds normal.  Abdominal: Soft. There is no tenderness. There is no rebound and no guarding.  Musculoskeletal: Normal range of motion.  Neurological: Alert, no facial droop, fluent speech, moves all extremities symmetrically Skin: Skin is warm and dry.  Psychiatric: Cooperative  ED Course  Procedures (including critical care time) Labs Review Labs Reviewed - No data to display  Imaging Review No results found. I have personally reviewed and evaluated these images and lab results as part of my medical decision-making.   EKG Interpretation   Date/Time:  Wednesday January 25 2015 14:54:35 EDT Ventricular Rate:  86 PR Interval:    QRS Duration: 104 QT Interval:  406 QTC Calculation: 486 R Axis:   128 Text Interpretation:  Atrial fibrillation Anteroseptal infarct, old No  significant change since last tracing Confirmed by LIU MD, DANA [phone]) on  01/25/2015 2:58:09 PM      MDM   Final diagnoses:  None    69 year old male with history of  atrial fibrillation, complete heart block with pacemaker, and chronic chest pain who presents with left-sided chest pain which is typical for his usual chronic chest pain. He is well-appearing, nontoxic, in no acute distress on presentation. Although initial triage heart rate 160, he on the EKG and cardiac monitor is noted to be in atrial fibrillation that is well rate controlled. No ischemic changes are noted. Troponin 1 is negative. He is given aspirin and Norco for pain control. Given that this is exact same as his previous chronic chest pain on and has had multiple cardiac workups for this, I do not believe this is consistent with underlying ACS. Presentation also not consistent for PE or dissection.   Patient was more interested in obtaining food in the emergency department rather than getting a complete cardiac rule out. After having a meal, patient requested to be discharged to that he could smoke outside. We discussed  potentially getting serial EKGs and troponins for rule out, but he states that this pain is gone away and did not want to stay for any longer. Strict return follow-up instructions were reviewed. He expressed understanding of all discharge instructions felt comfortable to plan of care.  Forde Dandy, MD 01/26/15 [phone]

## 2015-01-25 NOTE — Discharge Instructions (Signed)
Return for worsening symptoms, including worsening pain, difficulty breathing, passing out, or any other symptoms concerning to you.  Chest Pain (Nonspecific) It is often hard to give a diagnosis for the cause of chest pain. There is always a chance that your pain could be related to something serious, such as a heart attack or a blood clot in the lungs. You need to follow up with your doctor. HOME CARE  If antibiotic medicine was given, take it as directed by your doctor. Finish the medicine even if you start to feel better.  For the next few days, avoid activities that bring on chest pain. Continue physical activities as told by your doctor.  Do not use any tobacco products. This includes cigarettes, chewing tobacco, and e-cigarettes.  Avoid drinking alcohol.  Only take medicine as told by your doctor.  Follow your doctor's suggestions for more testing if your chest pain does not go away.  Keep all doctor visits you made. GET HELP IF:  Your chest pain does not go away, even after treatment.  You have a rash with blisters on your chest.  You have a fever. GET HELP RIGHT AWAY IF:   You have more pain or pain that spreads to your arm, neck, jaw, back, or belly (abdomen).  You have shortness of breath.  You cough more than usual or cough up blood.  You have very bad back or belly pain.  You feel sick to your stomach (nauseous) or throw up (vomit).  You have very bad weakness.  You pass out (faint).  You have chills. This is an emergency. Do not wait to see if the problems will go away. Call your local emergency services (911 in [place] Do not drive yourself to the hospital. MAKE SURE YOU:   Understand these instructions.  Will watch your condition.  Will get help right away if you are not doing well or get worse. Document Released: 11/13/2007 Document Revised: 06/01/2013 Document Reviewed: 11/13/2007 [facility] Patient Information 2015 [place], [place]. This information  is not intended to replace advice given to you by your health care provider. Make sure you discuss any questions you have with your health care provider.

## 2015-01-26 ENCOUNTER — Encounter: Payer: Self-pay | Admitting: Hematology

## 2015-01-26 ENCOUNTER — Other Ambulatory Visit: Payer: Self-pay | Admitting: *Deleted

## 2015-01-26 NOTE — Discharge Summary (Signed)
Physician Discharge Summary  [name]:[id number] DOB: 05/27/1946 DOA: 12/01/2014  PCP: Damon Poll, MD  Admit date: 12/01/2014 Discharge date: 01/26/2015  Time spent: None  Recommendations for Outpatient Follow-up:  1. Pt left AMA, please see my progress note for the same day.  Discharge Diagnoses:  Principal Problem:   Atypical angina Active Problems:   Protein-calorie malnutrition   BLOCK, AV, COMPLETE   ATHEROSCLEROSIS, AORTIC   Atrial fibrillation   BPH (benign prostatic hyperplasia)   HLD (hyperlipidemia)   Depression   Essential hypertension   Discharge Condition:   Diet recommendation: Hearty healthy  Filed Weights   12/01/14 1212  Weight: 44.226 kg (97 lb 8 oz)    History of present illness:  [name] is a 69 y.o. 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   He reports left-sided chest pain beginning at 1900 6/22 while sitting and resting quietly. It was not positional or pleuritic, not associated with dyspnea, diaphoresis, arm/neck/jaw pain. He denies any dyspnea with exertion when walking with a walker. He endorses chronic palpitations without recent changes. Denies orthopnea, PND, LE swelling. The staff at [facility] tranfserred him to [facility] ED where his pain was improved with morphine and NTG. ECG was inconclusive for ischemia due to paced rhythm and troponins have been negative x2.   He has been assessed in the [facility]ED x4 in the past week, twice now for chest pain. Was diagnosed with colitis on 6/21 and started cipro/flagyl. He reports 3-4 loose BMs per day which is not new. No recent weight loss, fevers, night sweats. He has no regular cardiologist and has not had an ischemic work up.  He is a resident of [facility] x 10 years due to homelessness. Smokes 7 cigarettes per  day. No EtOH or other drugs.  Hospital Course:  Left AMA  Procedures:  None  Consultations:  None  Discharge Exam: Filed Vitals:   12/02/14 1725  BP: 106/60  Pulse:   Temp:   Resp:    General: Alert and awake, oriented x3, not in any acute distress. HEENT: anicteric sclera, pupils reactive to light and accommodation, EOMI CVS: S1-S2 clear, no murmur rubs or gallops Chest: clear to auscultation bilaterally, no wheezing, rales or rhonchi Abdomen: soft nontender, nondistended, normal bowel sounds, no organomegaly Extremities: no cyanosis, clubbing or edema noted bilaterally Neuro: Cranial nerves II-XII intact, no focal neurological deficits  Discharge Instructions    Discharge Medication List as of 12/02/2014  8:46 PM    CONTINUE these medications which have NOT CHANGED   Details  Calcium Carbonate-Vit D-Min (CALTRATE 600+D PLUS MINERALS) 600-800 MG-UNIT TABS Take 1 tablet by mouth daily., Until Discontinued, Historical Med    !! carvedilol (COREG) 6.25 MG tablet Take 1 tablet (6.25 mg total) by mouth 2 (two) times daily with a meal., Starting 11/16/2014, Until Discontinued, Print    loperamide (IMODIUM A-D) 2 MG tablet Take 2 mg by mouth 4 (four) times daily as needed for diarrhea or loose stools., Until Discontinued, Historical Med    magnesium oxide (MAG-OX) 400 MG tablet Take 400 mg by mouth 2 (two) times daily., Until Discontinued, Historical Med    pravastatin (PRAVACHOL) 10 MG tablet Take 10 mg by mouth at bedtime., Until Discontinued, Historical Med    sertraline (ZOLOFT) 50 MG tablet Take 50 mg by  mouth every morning. , Until Discontinued, Historical Med    traMADol (ULTRAM) 50 MG tablet Take 50 mg by mouth every 6 (six) hours as needed for moderate pain., Until Discontinued, Historical Med    !! carvedilol (COREG) 12.5 MG tablet Take 12.5 mg by mouth 2 (two) times daily with a meal., Until Discontinued, Historical Med    ciprofloxacin (CIPRO) 500 MG tablet  Take 1 tablet (500 mg total) by mouth 2 (two) times daily. One po bid x 7 days, Starting 11/29/2014, Until Discontinued, Print    colchicine 0.6 MG tablet Take 1.2 mg by mouth once., Historical Med    docusate sodium (COLACE) 100 MG capsule Take 100 mg by mouth 2 (two) times daily., Until Discontinued, Historical Med    doxazosin (CARDURA) 4 MG tablet Take 4 mg by mouth daily., Until Discontinued, Historical Med    feeding supplement, ENSURE, (ENSURE) PUDG Take 1 Container by mouth 3 (three) times daily between meals., Starting 11/09/2014, Until Discontinued, OTC    metroNIDAZOLE (FLAGYL) 500 MG tablet Take 1 tablet (500 mg total) by mouth 3 (three) times daily. One po bid x 7 days, Starting 11/29/2014, Until Discontinued, Print    polycarbophil (FIBERCON) 625 MG tablet Take 1 tablet (625 mg total) by mouth daily., Starting 11/09/2014, Until Discontinued, No Print    rivaroxaban (XARELTO) 20 MG TABS tablet Take 20 mg by mouth daily., Until Discontinued, Historical Med    zolpidem (AMBIEN) 5 MG tablet Take 5 mg by mouth at bedtime., Until Discontinued, Historical Med     !! - Potential duplicate medications found. Please discuss with provider.     No Known Allergies    The results of significant diagnostics from this hospitalization (including imaging, microbiology, ancillary and laboratory) are listed below for reference.    Significant Diagnostic Studies: Dg Chest 2 View  01/25/2015   CLINICAL DATA:  Left-sided chest pain.  Atrial fibrillation.  EXAM: CHEST  2 VIEW  COMPARISON:  01/21/2015  FINDINGS: The lungs are hyperinflated likely secondary to COPD. There is no focal parenchymal opacity. There is no pleural effusion or pneumothorax. The heart and mediastinal contours are unremarkable. There is a dual lead pacemaker.  The osseous structures are unremarkable. There is an old right posterior fourth rib fracture.  IMPRESSION: No active cardiopulmonary disease.   Electronically Signed   By:  Kathreen Devoid   On: 01/25/2015 15:41   Dg Chest 2 View  01/21/2015   CLINICAL DATA:  Acute chest pain.  EXAM: CHEST  2 VIEW  COMPARISON:  January 15, 2015.  FINDINGS: The heart size and mediastinal contours are within normal limits. Both lungs are clear. No pneumothorax or pleural effusion is noted. Right-sided pacemaker is noted which is unchanged in position. The visualized skeletal structures are unremarkable.  IMPRESSION: No active cardiopulmonary disease.   Electronically Signed   By: Marijo Conception, M.D.   On: 01/21/2015 09:16   Dg Chest Portable 1 View  01/02/2015   CLINICAL DATA:  Chest pain  EXAM: PORTABLE CHEST - 1 VIEW  COMPARISON:  12/26/2014  FINDINGS: Dual lead right-sided pacer in place. Heart size mildly enlarged. Lungs are hyperaerated suggesting emphysema. Presumed nipple shadow over the left lung base reidentified. Right upper lobe granuloma is stable. No new focal pulmonary opacity or pleural effusion.  IMPRESSION: Mild cardiomegaly with changes of emphysema reidentified.   Electronically Signed   By: Conchita Paris M.D.   On: 01/02/2015 12:45   Dg Abd Acute W/chest  01/15/2015  CLINICAL DATA:  Mid abdominal pain for several days, worsened this morning  EXAM: DG ABDOMEN ACUTE W/ 1V CHEST  COMPARISON:  01/02/2015  FINDINGS: There is no evidence of dilated bowel loops or free intraperitoneal air. No radiopaque calculi or other significant radiographic abnormality is seen. Heart size and mediastinal contours are within normal limits. Both lungs are clear.  IMPRESSION: Negative abdominal radiographs.  No acute cardiopulmonary disease.   Electronically Signed   By: Andreas Newport M.D.   On: 01/15/2015 01:38    Microbiology: No results found for this or any previous visit (from the past 240 hour(s)).   Labs: Basic Metabolic Panel:  Recent Labs Lab 01/21/15 0845 01/24/15 1055 01/25/15 1512  NA 138 142 136  K 3.6 4.0 3.8  CL 103 109 105  CO2 25 26 23   GLUCOSE 134* 116* 96   BUN 19 24* 25*  CREATININE 0.63 0.82 0.76  CALCIUM 9.0 9.1 8.8*  MG  --   --  1.8  PHOS  --   --  3.7   Liver Function Tests: No results for input(s): AST, ALT, ALKPHOS, BILITOT, PROT, ALBUMIN in the last 168 hours. No results for input(s): LIPASE, AMYLASE in the last 168 hours. No results for input(s): AMMONIA in the last 168 hours. CBC:  Recent Labs Lab 01/21/15 0845 01/24/15 1055 01/25/15 1512  WBC 6.2 5.3 6.2  NEUTROABS  --  2.7 3.7  HGB 11.1* 11.4* 11.2*  HCT 35.0* 36.8* 35.0*  MCV 97.5 98.9 96.7  PLT 157 148* 133*   Cardiac Enzymes:  Recent Labs Lab 01/21/15 0845  TROPONINI <0.03   BNP: BNP (last 3 results) No results for input(s): BNP in the last 8760 hours.  ProBNP (last 3 results) No results for input(s): PROBNP in the last 8760 hours.  CBG: No results for input(s): GLUCAP in the last 168 hours.     Signed:  Pharell Rolfson A  [facility] 01/26/2015, 7:47 AM

## 2015-01-26 NOTE — Progress Notes (Signed)
No show  This encounter was created in error - please disregard.

## 2015-01-27 ENCOUNTER — Telehealth: Payer: Self-pay | Admitting: Hematology

## 2015-01-27 NOTE — Telephone Encounter (Signed)
per pof to call & r/s appt-left message w/r/s appt time & date

## 2015-02-02 NOTE — ED Provider Notes (Signed)
CSN: [id number]     Arrival date & time 01/24/15  9450 History   First MD Initiated Contact with Patient 01/24/15 1055     Chief Complaint  Patient presents with  . Abdominal Pain     (Consider location/radiation/quality/duration/timing/severity/associated sxs/prior Treatment) HPI   69 year old male with lower abdominal pain. Patient was found lying down on onset of the road earlier today. He denies falling or other trauma. ECT was just tired and laid down. Ultrasound bypass was to call EMS to have him evaluated. His complaints of lower abdominal pain approximately emergency room. He is unable to give me clear onset of his belly pain. Tenderness lower abdomen. Worse the middle. No urinary complaints. No nausea, vomiting or diarrhea. No fevers or chills.  Past Medical History  Diagnosis Date  . Depression   . Hypomagnesemia   . Fall at nursing home 05/27/2013    "slipped on water; said I broke my right hip" (05/27/2013)  . Migraine     "used to have them bad; haven't had one in awhile" (05/27/2013)  . Gout attack     "not too long ago; in my right foot" (05/27/2013)  . Complete heart block     reason for pacemaker/notes 05/27/2013  . Atrial fibrillation     [name] 05/27/2013  . HLD (hyperlipidemia)   . GIB (gastrointestinal bleeding)   . BPH (benign prostatic hyperplasia)   . Presence of permanent cardiac pacemaker   . Hypertension    Past Surgical History  Procedure Laterality Date  . Insert / replace / remove pacemaker    . Appendectomy    . Cholecystectomy    . Open reduction of hip Right 05/28/2013    Procedure: OPEN REDUCTION OF HIP;  Surgeon: Renette Butters, MD;  Location: [place];  Service: Orthopedics;  Laterality: Right;  . Esophagogastroduodenoscopy (egd) with propofol N/A 09/26/2014    Procedure: ESOPHAGOGASTRODUODENOSCOPY (EGD) WITH PROPOFOL;  Surgeon: Jerene Bears, MD;  Location[facility] ENDOSCOPY;  Service: Endoscopy;  Laterality: N/A;  . Colonoscopy N/A 10/01/2014     Procedure: COLONOSCOPY;  Surgeon: Jerene Bears, MD;  Location: [facility] ENDOSCOPY;  Service: Endoscopy;  Laterality: N/A;  . Partial colectomy N/A 10/05/2014    Procedure: SUBTOTAL COLECTOMY WITH ILEORECTAL ANASTOMOSIS;  Surgeon: Donnie Mesa, MD;  Location: [place];  Service: General;  Laterality: N/A;  . Flexible sigmoidoscopy N/A 11/07/2014    Procedure: FLEXIBLE SIGMOIDOSCOPY;  Surgeon: Jerene Bears, MD;  Location: [facility] ENDOSCOPY;  Service: Endoscopy;  Laterality: N/A;   Family History  Problem Relation Age of Onset  . Colon cancer Mother    Social History  Substance Use Topics  . Smoking status: Current Some Day Smoker -- 0.50 packs/day for 66 years    Types: Cigarettes  . Smokeless tobacco: Former Systems developer    Types: Snuff, Chew     Comment: 05/27/2013 "haven't used chew or snuff in ~ 30 yr"  . Alcohol Use: No     Comment: 05/27/2013 "none in years; never had problem w/it" - not in 30 years    Review of Systems  All systems reviewed and negative, other than as noted in HPI.   Allergies  Review of patient's allergies indicates no known allergies.  Home Medications   Prior to Admission medications   Medication Sig Start Date End Date Taking? Authorizing Provider  Calcium Carbonate-Vit D-Min (CALTRATE 600+D PLUS MINERALS) 600-800 MG-UNIT TABS Take 1 tablet by mouth daily.    Historical Provider, MD  carvedilol (COREG) 6.25  MG tablet Take 1 tablet (6.25 mg total) by mouth 2 (two) times daily with a meal. 11/16/14   Velvet Bathe, MD  finasteride (PROSCAR) 5 MG tablet Take 5 mg by mouth daily.    Historical Provider, MD  HYDROcodone-acetaminophen (NORCO/VICODIN) 5-325 MG per tablet Take 1 tablet by mouth 2 (two) times daily as needed for severe pain. 12/05/14   Everlene Balls, MD  Lactobacillus-Inulin ([place]) CAPS Take 1 capsule by mouth 3 (three) times daily. Patient taking differently: Take 1 capsule by mouth daily.  12/12/14   April Palumbo, MD  loperamide (IMODIUM A-D) 2 MG  tablet Take 2 mg by mouth 4 (four) times daily as needed for diarrhea or loose stools.    Historical Provider, MD  magnesium oxide (MAG-OX) 400 MG tablet Take 400 mg by mouth 2 (two) times daily.    Historical Provider, MD  permethrin (ELIMITE) 5 % cream Apply to affected area once 01/21/15   Lacretia Leigh, MD  pravastatin (PRAVACHOL) 10 MG tablet Take 10 mg by mouth at bedtime.    Historical Provider, MD  Probiotic Product (ALIGN) 4 MG CAPS Take 1 capsule by mouth 4 (four) times daily. 01/15/15   April Palumbo, MD  sertraline (ZOLOFT) 50 MG tablet Take 50 mg by mouth every morning.     Historical Provider, MD  traMADol (ULTRAM) 50 MG tablet Take 50 mg by mouth every 6 (six) hours as needed for moderate pain.    Historical Provider, MD   BP 132/70 mmHg  Pulse 78  Temp(Src) 98.1 F (36.7 C) (Oral)  Resp 18  Ht 5\' 5"  (1.651 m)  Wt 80 lb (36.288 kg)  BMI 13.31 kg/m2  SpO2 99% Physical Exam  Constitutional: He appears well-developed and well-nourished. No distress.  HENT:  Head: Normocephalic and atraumatic.  Eyes: Conjunctivae are normal. Right eye exhibits no discharge. Left eye exhibits no discharge.  Neck: Neck supple.  Cardiovascular: Normal rate, regular rhythm and normal heart sounds.  Exam reveals no gallop and no friction rub.   No murmur heard. Pulmonary/Chest: Effort normal and breath sounds normal. No respiratory distress.  Abdominal: Soft. He exhibits no distension. There is no tenderness.  Musculoskeletal: He exhibits no edema or tenderness.  Neurological: He is alert.  Skin: Skin is warm and dry.  Psychiatric: He has a normal mood and affect. His behavior is normal. Thought content normal.  Nursing note and vitals reviewed.   ED Course  Procedures (including critical care time) Labs Review Labs Reviewed  CBC WITH DIFFERENTIAL/PLATELET - Abnormal; Notable for the following:    RBC 3.72 (*)    Hemoglobin 11.4 (*)    HCT 36.8 (*)    RDW 15.9 (*)    Platelets 148 (*)     All other components within normal limits  BASIC METABOLIC PANEL - Abnormal; Notable for the following:    Glucose, Bld 116 (*)    BUN 24 (*)    All other components within normal limits  URINALYSIS, ROUTINE W REFLEX MICROSCOPIC (NOT AT [name])    Imaging Review No results found. I have personally reviewed and evaluated these images and lab results as part of my medical decision-making.   EKG Interpretation None      MDM   Final diagnoses:  Lower abdominal pain    69 year old male with lower abdominal pain. Exam is fairly unremarkable. Minimal tenderness. Workup unrevealing. Patient is well-known to the emergency room. Multiple visits, typically for chest pain. He is denying this to  me currently.    Virgel Manifold, MD 02/02/15 [phone]

## 2015-02-10 ENCOUNTER — Encounter: Payer: Self-pay | Admitting: *Deleted

## 2015-02-15 ENCOUNTER — Encounter: Payer: Self-pay | Admitting: Hematology

## 2015-02-15 ENCOUNTER — Ambulatory Visit: Payer: Self-pay | Admitting: Hematology

## 2015-02-27 ENCOUNTER — Encounter (HOSPITAL_COMMUNITY): Payer: Self-pay | Admitting: Emergency Medicine

## 2015-02-27 ENCOUNTER — Emergency Department (HOSPITAL_COMMUNITY)
Admission: EM | Admit: 2015-02-27 | Discharge: 2015-02-27 | Disposition: A | Discharge status: Home / Self Care | Payer: Medicare Other | Attending: Emergency Medicine | Admitting: Emergency Medicine

## 2015-02-27 ENCOUNTER — Emergency Department (HOSPITAL_COMMUNITY): Payer: Medicare Other

## 2015-02-27 ENCOUNTER — Emergency Department (HOSPITAL_COMMUNITY)
Admission: EM | Admit: 2015-02-27 | Discharge: 2015-02-28 | Discharge status: Against Medical Advice | Payer: Medicare Other | Attending: Emergency Medicine | Admitting: Emergency Medicine

## 2015-02-27 ENCOUNTER — Encounter (HOSPITAL_COMMUNITY): Payer: Self-pay

## 2015-02-27 DIAGNOSIS — Z8659 Personal history of other mental and behavioral disorders: Secondary | ICD-10-CM | POA: Diagnosis not present

## 2015-02-27 DIAGNOSIS — Z950 Presence of cardiac pacemaker: Secondary | ICD-10-CM | POA: Diagnosis not present

## 2015-02-27 DIAGNOSIS — Z720 Tobacco use: Secondary | ICD-10-CM | POA: Diagnosis not present

## 2015-02-27 DIAGNOSIS — R109 Unspecified abdominal pain: Secondary | ICD-10-CM | POA: Insufficient documentation

## 2015-02-27 DIAGNOSIS — Z8572 Personal history of non-Hodgkin lymphomas: Secondary | ICD-10-CM | POA: Insufficient documentation

## 2015-02-27 DIAGNOSIS — R079 Chest pain, unspecified: Secondary | ICD-10-CM | POA: Diagnosis present

## 2015-02-27 DIAGNOSIS — Z8719 Personal history of other diseases of the digestive system: Secondary | ICD-10-CM | POA: Diagnosis not present

## 2015-02-27 DIAGNOSIS — I4891 Unspecified atrial fibrillation: Secondary | ICD-10-CM | POA: Insufficient documentation

## 2015-02-27 DIAGNOSIS — R0789 Other chest pain: Secondary | ICD-10-CM | POA: Insufficient documentation

## 2015-02-27 DIAGNOSIS — I10 Essential (primary) hypertension: Secondary | ICD-10-CM | POA: Insufficient documentation

## 2015-02-27 DIAGNOSIS — Z8639 Personal history of other endocrine, nutritional and metabolic disease: Secondary | ICD-10-CM | POA: Diagnosis not present

## 2015-02-27 DIAGNOSIS — Z87438 Personal history of other diseases of male genital organs: Secondary | ICD-10-CM | POA: Insufficient documentation

## 2015-02-27 DIAGNOSIS — I499 Cardiac arrhythmia, unspecified: Secondary | ICD-10-CM | POA: Diagnosis not present

## 2015-02-27 DIAGNOSIS — Z8739 Personal history of other diseases of the musculoskeletal system and connective tissue: Secondary | ICD-10-CM | POA: Diagnosis not present

## 2015-02-27 DIAGNOSIS — Z794 Long term (current) use of insulin: Secondary | ICD-10-CM | POA: Diagnosis not present

## 2015-02-27 DIAGNOSIS — Z85038 Personal history of other malignant neoplasm of large intestine: Secondary | ICD-10-CM | POA: Insufficient documentation

## 2015-02-27 LAB — HEPATIC FUNCTION PANEL
ALBUMIN: 3.7 g/dL (ref 3.5–5.0)
ALK PHOS: 93 U/L (ref 38–126)
ALT: 12 U/L — ABNORMAL LOW (ref 17–63)
AST: 17 U/L (ref 15–41)
BILIRUBIN TOTAL: 0.3 mg/dL (ref 0.3–1.2)
Bilirubin, Direct: <0.1 mg/dL — ABNORMAL LOW (ref 0.1–0.5)
TOTAL PROTEIN: 7.7 g/dL (ref 6.5–8.1)

## 2015-02-27 LAB — BASIC METABOLIC PANEL
Anion gap: 8 (ref 5–15)
BUN: 21 mg/dL — AB (ref 6–20)
CO2: 27 mmol/L (ref 22–32)
CREATININE: 0.91 mg/dL (ref 0.61–1.24)
Calcium: 9.6 mg/dL (ref 8.9–10.3)
Chloride: 105 mmol/L (ref 101–111)
Glucose, Bld: 105 mg/dL — ABNORMAL HIGH (ref 65–99)
POTASSIUM: 5 mmol/L (ref 3.5–5.1)
SODIUM: 140 mmol/L (ref 135–145)

## 2015-02-27 LAB — CBC
HCT: 34.9 % — ABNORMAL LOW (ref 39.0–52.0)
Hemoglobin: 10.9 g/dL — ABNORMAL LOW (ref 13.0–17.0)
MCH: 29.9 pg (ref 26.0–34.0)
MCHC: 31.2 g/dL (ref 30.0–36.0)
MCV: 95.6 fL (ref 78.0–100.0)
PLATELETS: 127 10*3/uL — AB (ref 150–400)
RBC: 3.65 MIL/uL — AB (ref 4.22–5.81)
RDW: 14.7 % (ref 11.5–15.5)
WBC: 6.5 10*3/uL (ref 4.0–10.5)

## 2015-02-27 LAB — I-STAT TROPONIN, ED: TROPONIN I, POC: 0 ng/mL (ref 0.00–0.08)

## 2015-02-27 LAB — LIPASE, BLOOD: Lipase: 48 U/L (ref 22–51)

## 2015-02-27 MED ORDER — GI COCKTAIL ~~LOC~~
30.0000 mL | Freq: Once | ORAL | Status: AC
  Administered 2015-02-27: 30 mL via ORAL
  Filled 2015-02-27: qty 30

## 2015-02-27 MED ORDER — ASPIRIN 81 MG PO CHEW
324.0000 mg | CHEWABLE_TABLET | Freq: Once | ORAL | Status: AC
  Administered 2015-02-27: 324 mg via ORAL
  Filled 2015-02-27: qty 4

## 2015-02-27 NOTE — ED Provider Notes (Signed)
CSN: [id number]     Arrival date & time 02/27/15  1531 History   First MD Initiated Contact with Patient 02/27/15 1556     Chief Complaint  Patient presents with  . Chest Pain     (Consider location/radiation/quality/duration/timing/severity/associated sxs/prior Treatment) HPI [name] is a 69 y.o. male with history of afib, htn, migraine presents to ED with complaint of chest pain. Pt states pain started this morning after eating breakfast. Pt reports hx of similar pain in the past. States nothing is making pain better or worse. Denies nausea, vomiting. No SOB. Admits to epigastric abdominal pain. Admits to some cough which is chronic. Pt is a current smoker. No fever or chills. Pt with multiple visits to ED for the same. Requesting pain medications.   Past Medical History  Diagnosis Date  . Depression   . Hypomagnesemia   . Fall at nursing home 05/27/2013    "slipped on water; said I broke my right hip" (05/27/2013)  . Migraine     "used to have them bad; haven't had one in awhile" (05/27/2013)  . Gout attack     "not too long ago; in my right foot" (05/27/2013)  . Complete heart block     reason for pacemaker/notes 05/27/2013  . Atrial fibrillation     [name] 05/27/2013  . HLD (hyperlipidemia)   . GIB (gastrointestinal bleeding)   . BPH (benign prostatic hyperplasia)   . Presence of permanent cardiac pacemaker   . Hypertension   . Gastric ulcer   . Non Hodgkin's lymphoma   . Tubular adenoma of colon    Past Surgical History  Procedure Laterality Date  . Insert / replace / remove pacemaker    . Appendectomy    . Cholecystectomy    . Open reduction of hip Right 05/28/2013    Procedure: OPEN REDUCTION OF HIP;  Surgeon: Renette Butters, MD;  Location: [place];  Service: Orthopedics;  Laterality: Right;  . Esophagogastroduodenoscopy (egd) with propofol N/A 09/26/2014    Procedure: ESOPHAGOGASTRODUODENOSCOPY (EGD) WITH PROPOFOL;  Surgeon: Jerene Bears, MD;  Location: [facility]  ENDOSCOPY;  Service: Endoscopy;  Laterality: N/A;  . Colonoscopy N/A 10/01/2014    Procedure: COLONOSCOPY;  Surgeon: Jerene Bears, MD;  Location: [facility] ENDOSCOPY;  Service: Endoscopy;  Laterality: N/A;  . Partial colectomy N/A 10/05/2014    Procedure: SUBTOTAL COLECTOMY WITH ILEORECTAL ANASTOMOSIS;  Surgeon: Donnie Mesa, MD;  Location: [place];  Service: General;  Laterality: N/A;  . Flexible sigmoidoscopy N/A 11/07/2014    Procedure: FLEXIBLE SIGMOIDOSCOPY;  Surgeon: Jerene Bears, MD;  Location[facility] ENDOSCOPY;  Service: Endoscopy;  Laterality: N/A;  . Gastric resection     Family History  Problem Relation Age of Onset  . Colon cancer Mother    Social History  Substance Use Topics  . Smoking status: Current Some Day Smoker -- 0.50 packs/day for 66 years    Types: Cigarettes  . Smokeless tobacco: Former Systems developer    Types: Snuff, Chew     Comment: 05/27/2013 "haven't used chew or snuff in ~ 30 yr"  . Alcohol Use: No     Comment: 05/27/2013 "none in years; never had problem w/it" - not in 30 years    Review of Systems  Constitutional: Negative for fever and chills.  Respiratory: Positive for cough. Negative for chest tightness and shortness of breath.   Cardiovascular: Positive for chest pain. Negative for palpitations and leg swelling.  Gastrointestinal: Positive for abdominal pain. Negative for nausea,  vomiting, diarrhea and abdominal distention.  Genitourinary: Negative for dysuria, urgency, frequency and hematuria.  Musculoskeletal: Negative for myalgias, arthralgias, neck pain and neck stiffness.  Skin: Negative for rash.  Allergic/Immunologic: Negative for immunocompromised state.  Neurological: Negative for dizziness, weakness, light-headedness, numbness and headaches.  All other systems reviewed and are negative.     Allergies  Review of patient's allergies indicates no known allergies.  Home Medications   Prior to Admission medications   Medication Sig Start Date End Date  Taking? Authorizing Provider  Calcium Carbonate-Vit D-Min (CALTRATE 600+D PLUS MINERALS) 600-800 MG-UNIT TABS Take 1 tablet by mouth daily.    Historical Provider, MD  carvedilol (COREG) 6.25 MG tablet Take 1 tablet (6.25 mg total) by mouth 2 (two) times daily with a meal. 11/16/14   Velvet Bathe, MD  finasteride (PROSCAR) 5 MG tablet Take 5 mg by mouth daily.    Historical Provider, MD  HYDROcodone-acetaminophen (NORCO/VICODIN) 5-325 MG per tablet Take 1 tablet by mouth 2 (two) times daily as needed for severe pain. 12/05/14   Everlene Balls, MD  Lactobacillus-Inulin ([place]) CAPS Take 1 capsule by mouth 3 (three) times daily. Patient taking differently: Take 1 capsule by mouth daily.  12/12/14   April Palumbo, MD  loperamide (IMODIUM A-D) 2 MG tablet Take 2 mg by mouth 4 (four) times daily as needed for diarrhea or loose stools.    Historical Provider, MD  magnesium oxide (MAG-OX) 400 MG tablet Take 400 mg by mouth 2 (two) times daily.    Historical Provider, MD  permethrin (ELIMITE) 5 % cream Apply to affected area once 01/21/15   Lacretia Leigh, MD  pravastatin (PRAVACHOL) 10 MG tablet Take 10 mg by mouth at bedtime.    Historical Provider, MD  Probiotic Product (ALIGN) 4 MG CAPS Take 1 capsule by mouth 4 (four) times daily. 01/15/15   April Palumbo, MD  sertraline (ZOLOFT) 50 MG tablet Take 50 mg by mouth every morning.     Historical Provider, MD  traMADol (ULTRAM) 50 MG tablet Take 50 mg by mouth every 6 (six) hours as needed for moderate pain.    Historical Provider, MD   BP 122/63 mmHg  Pulse 79  Temp(Src) 98 F (36.7 C) (Oral)  Resp 16  Ht 5\' 9"  (1.753 m)  Wt 80 lb (36.288 kg)  BMI 11.81 kg/m2  SpO2 96% Physical Exam  Constitutional: He is oriented to person, place, and time. He appears well-developed and well-nourished. No distress.  HENT:  Head: Normocephalic and atraumatic.  Eyes: Conjunctivae are normal.  Neck: Neck supple.  Cardiovascular: Normal rate, regular  rhythm and normal heart sounds.   Pulmonary/Chest: Effort normal. No respiratory distress. He has no wheezes. He has no rales. He exhibits tenderness.  Abdominal: Soft. Bowel sounds are normal. He exhibits no distension. There is tenderness. There is no rebound.  Epigastric tenderness. Lower chest/sternum tenderness.   Musculoskeletal: He exhibits no edema.  Neurological: He is alert and oriented to person, place, and time.  Skin: Skin is warm and dry.  Nursing note and vitals reviewed.   ED Course  Procedures (including critical care time) Labs Review Labs Reviewed  BASIC METABOLIC PANEL - Abnormal; Notable for the following:    Glucose, Bld 105 (*)    BUN 21 (*)    All other components within normal limits  CBC - Abnormal; Notable for the following:    RBC 3.65 (*)    Hemoglobin 10.9 (*)    HCT 34.9 (*)  Platelets 127 (*)    All other components within normal limits  HEPATIC FUNCTION PANEL - Abnormal; Notable for the following:    ALT 12 (*)    Bilirubin, Direct <0.1 (*)    All other components within normal limits  LIPASE, BLOOD  I-STAT TROPOININ, ED    Imaging Review Dg Chest 2 View  02/27/2015   CLINICAL DATA:  Chest pain for 1 day  EXAM: CHEST  2 VIEW  COMPARISON:  01/25/2015  FINDINGS: There is hyperinflation of the lungs compatible with COPD. Right pacer remains in place, unchanged. Heart and mediastinal contours are within normal limits. No focal opacities or effusions. No acute bony abnormality.  IMPRESSION: COPD.  No active disease.   Electronically Signed   By: Rolm Baptise M.D.   On: 02/27/2015 16:18   I have personally reviewed and evaluated these images and lab results as part of my medical decision-making.   EKG Interpretation   Date/Time:  Monday February 27 2015 15:35:49 EDT Ventricular Rate:  89 PR Interval:    QRS Duration: 100 QT Interval:  356 QTC Calculation: 433 R Axis:   108 Text Interpretation:   Suspect arm lead reversal, interpretation   assumes no reversal Atrial flutter with variable A-V block Rightward axis  Pulmonary disease pattern Abnormal ECG flutter appears new compared to  prior tracing Confirmed by Debby Freiberg [phone]) on 02/27/2015 3:56:14 PM      MDM   Final diagnoses:  Chest pain, unspecified chest pain type   Pt here with CP. Has been constant since this morning. Hx of afib. ecg unchanged.   RN informed me pt trying to leave. Spoke with pt, advised to stay since his labs were not resulted yet. Pt stated he is ready to go. Michela Pitcher he wanted to go smoke and he is leaving to go home. Explained he is leaving AMA.   Filed Vitals:   02/27/15 1538 02/27/15 1600 02/27/15 1645  BP: 122/63 129/84 129/76  Pulse: 79 80 96  Temp: 98 F (36.7 C)    TempSrc: Oral    Resp: 16 17 14   Height: 5\' 9"  (1.753 m)    Weight: 80 lb (36.288 kg)    SpO2: 96% 99% 99%     Jeannett Senior, PA-C 02/28/15 0041  Debby Freiberg, MD 03/01/15 [phone]

## 2015-02-27 NOTE — ED Notes (Signed)
Pt from home for eval of left sided cp that started today, pt states radiates to back and is a constant ache. Pt states he normally takes darvaset for the cp and it is relieved but ran out of the meds. nad noted. Pt axo x4. Skin warm and dry.

## 2015-02-27 NOTE — ED Notes (Addendum)
Pt left rm at 1703 walking toward FT while fastening his pants and belt.  Nurse Sec [place] asked if he needed help so I stood up and spoke.  He stated, "I'm goin' to smoke a cigarette."  I advised pt that he could not leave the ED to smoke, nor could he return.I asked him to return to his room, A7. He became agitated and angry and strongly said, "I'm goin' to smoke a damn cigarette."  I tried to convince him to reconsider and return to his room several more times to no avail.  Since I have worked with Mr. Benish at least 3 times prior, I hoped he would change his mind.  Marylyn Ishihara, EMT, also tried to convince him to stay but he refused.  I finally got pt to return to rm so his IV could be removed.  Security arrived while pt was still in hall so they escorted him to A7 and then out of the ED.  Pt was repeatedly informed about the risk of leaving to no avail.

## 2015-02-27 NOTE — ED Notes (Signed)
Spoke to lab, will add on Lipase and Hepatic Function.

## 2015-02-27 NOTE — ED Notes (Signed)
Pt states that he wants the rest of his colon out, he's been in jail for 45 days, it was suppose to already been done, he told EMS that he was hungry and wanted something to eat

## 2015-02-27 NOTE — ED Notes (Signed)
This RN came out of another patient room and patient, tech and security were in the hallway.  Pt states he does not want to stay any longer and he would like to go outside and smoke a cigarette.  Pt made fully aware of risk associated with pt leaving AMA and pt signed form.  IV removed and pt escorted to lobby by security.

## 2015-02-28 ENCOUNTER — Emergency Department (HOSPITAL_COMMUNITY)
Admission: EM | Admit: 2015-02-28 | Discharge: 2015-02-28 | Disposition: A | Discharge status: Home / Self Care | Payer: Medicare Other | Source: Home / Self Care | Attending: Emergency Medicine | Admitting: Emergency Medicine

## 2015-02-28 ENCOUNTER — Encounter (HOSPITAL_COMMUNITY): Payer: Self-pay | Admitting: *Deleted

## 2015-02-28 DIAGNOSIS — R0789 Other chest pain: Secondary | ICD-10-CM | POA: Diagnosis not present

## 2015-02-28 LAB — I-STAT TROPONIN, ED: Troponin i, poc: 0 ng/mL (ref 0.00–0.08)

## 2015-02-28 MED ORDER — ACETAMINOPHEN 325 MG PO TABS
650.0000 mg | ORAL_TABLET | Freq: Once | ORAL | Status: AC
  Administered 2015-02-28: 650 mg via ORAL
  Filled 2015-02-28: qty 2

## 2015-02-28 MED ORDER — GI COCKTAIL ~~LOC~~
30.0000 mL | Freq: Once | ORAL | Status: AC
  Administered 2015-02-28: 30 mL via ORAL
  Filled 2015-02-28: qty 30

## 2015-02-28 NOTE — Discharge Instructions (Signed)

## 2015-02-28 NOTE — ED Notes (Signed)
Pt was going to be moved to a room but was outside smoking

## 2015-02-28 NOTE — ED Notes (Signed)
Pt arrives via [facility]. Pt was seen and d/c last night for abd pain. Pt states that once he was d/c he began having CP. Pt states he is homeless and has recently been kicked out of the homeless shelter. Pt received 324mg  ASA and 2 nitro PTA that took his pain from a 9/10 to a 8/10. Pt appears in NAD upon arrival.

## 2015-02-28 NOTE — ED Notes (Signed)
Pt to go into 48

## 2015-02-28 NOTE — ED Notes (Signed)
Pt is in stable condition upon d/c and is escorted from ED via wheelchair. 

## 2015-02-28 NOTE — ED Provider Notes (Addendum)
CSN: [id number]     Arrival date & time 02/28/15  1254 History   First MD Initiated Contact with Patient 02/28/15 1329     Chief Complaint  Patient presents with  . Chest Pain     (Consider location/radiation/quality/duration/timing/severity/associated sxs/prior Treatment) Patient is a 69 y.o. male presenting with chest pain. The history is provided by the patient.  Chest Pain Pain location:  Substernal area Pain quality: sharp and shooting   Pain radiates to:  Does not radiate Pain radiates to the back: no   Pain severity:  Moderate Onset quality:  Gradual Duration:  15 hours (started after leaving the hospital yesterday) Timing:  Constant Progression:  Unchanged Chronicity:  Chronic Relieved by:  Nothing Worsened by:  Movement, deep breathing and coughing Ineffective treatments:  Nitroglycerin and aspirin Associated symptoms: no abdominal pain, no anorexia, no anxiety, no back pain, no cough, no fever, no lower extremity edema, no nausea, no shortness of breath, not vomiting and no weakness   Risk factors: no coronary artery disease     Past Medical History  Diagnosis Date  . Depression   . Hypomagnesemia   . Fall at nursing home 05/27/2013    "slipped on water; said I broke my right hip" (05/27/2013)  . Migraine     "used to have them bad; haven't had one in awhile" (05/27/2013)  . Gout attack     "not too long ago; in my right foot" (05/27/2013)  . Complete heart block     reason for pacemaker/notes 05/27/2013  . Atrial fibrillation     [name] 05/27/2013  . HLD (hyperlipidemia)   . GIB (gastrointestinal bleeding)   . BPH (benign prostatic hyperplasia)   . Presence of permanent cardiac pacemaker   . Hypertension   . Gastric ulcer   . Non Hodgkin's lymphoma   . Tubular adenoma of colon    Past Surgical History  Procedure Laterality Date  . Insert / replace / remove pacemaker    . Appendectomy    . Cholecystectomy    . Open reduction of hip Right 05/28/2013      Procedure: OPEN REDUCTION OF HIP;  Surgeon: Renette Butters, MD;  Location: [place];  Service: Orthopedics;  Laterality: Right;  . Esophagogastroduodenoscopy (egd) with propofol N/A 09/26/2014    Procedure: ESOPHAGOGASTRODUODENOSCOPY (EGD) WITH PROPOFOL;  Surgeon: Jerene Bears, MD;  Location: [place] ENDOSCOPY;  Service: Endoscopy;  Laterality: N/A;  . Colonoscopy N/A 10/01/2014    Procedure: COLONOSCOPY;  Surgeon: Jerene Bears, MD;  Location: [facility] ENDOSCOPY;  Service: Endoscopy;  Laterality: N/A;  . Partial colectomy N/A 10/05/2014    Procedure: SUBTOTAL COLECTOMY WITH ILEORECTAL ANASTOMOSIS;  Surgeon: Donnie Mesa, MD;  Location: [place];  Service: General;  Laterality: N/A;  . Flexible sigmoidoscopy N/A 11/07/2014    Procedure: FLEXIBLE SIGMOIDOSCOPY;  Surgeon: Jerene Bears, MD;  Location: [facility] ENDOSCOPY;  Service: Endoscopy;  Laterality: N/A;  . Gastric resection     Family History  Problem Relation Age of Onset  . Colon cancer Mother    Social History  Substance Use Topics  . Smoking status: Current Some Day Smoker -- 0.50 packs/day for 66 years    Types: Cigarettes  . Smokeless tobacco: Former Systems developer    Types: Snuff, Chew     Comment: 05/27/2013 "haven't used chew or snuff in ~ 30 yr"  . Alcohol Use: No     Comment: 05/27/2013 "none in years; never had problem w/it" - not in 30 years  Review of Systems  Constitutional: Negative for fever.  Respiratory: Negative for cough and shortness of breath.   Cardiovascular: Positive for chest pain.  Gastrointestinal: Negative for nausea, vomiting, abdominal pain and anorexia.  Musculoskeletal: Negative for back pain.  Neurological: Negative for weakness.  All other systems reviewed and are negative.     Allergies  Review of patient's allergies indicates no known allergies.  Home Medications   Prior to Admission medications   Medication Sig Start Date End Date Taking? Authorizing Provider  carvedilol (COREG) 6.25 MG tablet Take 1 tablet  (6.25 mg total) by mouth 2 (two) times daily with a meal. Patient not taking: Reported on 02/28/2015 11/16/14   Velvet Bathe, MD  HYDROcodone-acetaminophen (NORCO/VICODIN) 5-325 MG per tablet Take 1 tablet by mouth 2 (two) times daily as needed for severe pain. 12/05/14   Everlene Balls, MD  Lactobacillus-Inulin ([place]) CAPS Take 1 capsule by mouth 3 (three) times daily. Patient not taking: Reported on 02/27/2015 12/12/14   April Palumbo, MD  loperamide (IMODIUM A-D) 2 MG tablet Take 2 mg by mouth 4 (four) times daily as needed for diarrhea or loose stools.    Historical Provider, MD  permethrin (ELIMITE) 5 % cream Apply to affected area once Patient not taking: Reported on 02/28/2015 01/21/15   Lacretia Leigh, MD  Probiotic Product (ALIGN) 4 MG CAPS Take 1 capsule by mouth 4 (four) times daily. Patient not taking: Reported on 02/27/2015 01/15/15   April Palumbo, MD   BP 124/67 mmHg  Pulse 49  Temp(Src) 98.4 F (36.9 C) (Oral)  Resp 14  SpO2 100% Physical Exam  Constitutional: He is oriented to person, place, and time. He appears well-developed and well-nourished. No distress.  HENT:  Head: Normocephalic and atraumatic.  Mouth/Throat: Oropharynx is clear and moist.  Eyes: Conjunctivae and EOM are normal. Pupils are equal, round, and reactive to light.  Neck: Normal range of motion. Neck supple.  Cardiovascular: Normal rate and intact distal pulses.  An irregularly irregular rhythm present.  No murmur heard. Pulmonary/Chest: Effort normal and breath sounds normal. No respiratory distress. He has no wheezes. He has no rales. He exhibits tenderness. He exhibits no crepitus.    Abdominal: Soft. He exhibits no distension. There is no tenderness. There is no rebound and no guarding.  Musculoskeletal: Normal range of motion. He exhibits no edema or tenderness.  No unilateral calf pain or swelling  Neurological: He is alert and oriented to person, place, and time.  Skin: Skin is warm  and dry. No rash noted. No erythema.  Multiple lesions over the face and extremities.  Psychiatric: He has a normal mood and affect. His behavior is normal.  Nursing note and vitals reviewed.   ED Course  Procedures (including critical care time) Labs Review Labs Reviewed  Randolm Idol, ED    Imaging Review Dg Chest 2 View  02/27/2015   CLINICAL DATA:  Chest pain for 1 day  EXAM: CHEST  2 VIEW  COMPARISON:  01/25/2015  FINDINGS: There is hyperinflation of the lungs compatible with COPD. Right pacer remains in place, unchanged. Heart and mediastinal contours are within normal limits. No focal opacities or effusions. No acute bony abnormality.  IMPRESSION: COPD.  No active disease.   Electronically Signed   By: Rolm Baptise M.D.   On: 02/27/2015 16:18   I have personally reviewed and evaluated these images and lab results as part of my medical decision-making.   EKG Interpretation   Date/Time:  Tuesday  February 28 2015 12:56:59 EDT Ventricular Rate:  80 PR Interval:    QRS Duration: 98 QT Interval:  398 QTC Calculation: 459 R Axis:   89 Text Interpretation:  Atrial fibrillation No significant change since last  tracing Confirmed by Maryan Rued  MD, Loree Fee ([id number]) on 02/28/2015 1:30:11 PM      MDM   Final diagnoses:  Atypical chest pain   Patient with multiple ER visits she was seen less than 24 hours ago with abdominal pain had normal CBC, CMP, troponin, chest x-ray and lipase who returns today for chest pain that started approximately 15 hours ago. It is sharp and pleuritic in nature. He denies any new cough, fever or respiratory symptoms. He states he gets this pain all the time. He was given aspirin and nitroglycerin in the ambulance but did not affect his pain.  He denies  alcohol use and states he'll use drugs once a year which was cocaine last used about 1 year ago.  He does abuse tobacco. He has no prior history of coronary artery disease and no stents. Low suspicion  that patient has a PE. He is homeless but does state he has somewhere to go. On exam he has chronic atrial fibrillation that is not anticoagulated. No strokelike symptoms.  EKG today shows persistent atrial fibrillation without RVR. Chest x-ray without acute findings. Lungs are clear bilaterally. Patient's troponin is negative and now with pain ongoing for the last 15 hours this is ACS would expect to have an elevated troponin.  Patient improved after a GI cocktail and food. He was discharged.    Blanchie Dessert, MD 02/28/15 1611  Blanchie Dessert, MD 02/28/15 [id number]  Blanchie Dessert, MD 02/28/15 2023

## 2015-03-01 ENCOUNTER — Observation Stay (HOSPITAL_COMMUNITY): Payer: Medicare Other

## 2015-03-01 ENCOUNTER — Encounter (HOSPITAL_COMMUNITY): Payer: Self-pay | Admitting: Emergency Medicine

## 2015-03-01 ENCOUNTER — Other Ambulatory Visit: Payer: Self-pay | Admitting: *Deleted

## 2015-03-01 ENCOUNTER — Observation Stay (HOSPITAL_COMMUNITY)
Admission: EM | Admit: 2015-03-01 | Discharge: 2015-03-02 | Disposition: A | Discharge status: Home / Self Care | Payer: Medicare Other | Attending: Internal Medicine | Admitting: Internal Medicine

## 2015-03-01 DIAGNOSIS — N400 Enlarged prostate without lower urinary tract symptoms: Secondary | ICD-10-CM | POA: Insufficient documentation

## 2015-03-01 DIAGNOSIS — F329 Major depressive disorder, single episode, unspecified: Secondary | ICD-10-CM | POA: Insufficient documentation

## 2015-03-01 DIAGNOSIS — E785 Hyperlipidemia, unspecified: Secondary | ICD-10-CM | POA: Insufficient documentation

## 2015-03-01 DIAGNOSIS — Z7901 Long term (current) use of anticoagulants: Secondary | ICD-10-CM

## 2015-03-01 DIAGNOSIS — E43 Unspecified severe protein-calorie malnutrition: Secondary | ICD-10-CM | POA: Diagnosis not present

## 2015-03-01 DIAGNOSIS — Z23 Encounter for immunization: Secondary | ICD-10-CM | POA: Insufficient documentation

## 2015-03-01 DIAGNOSIS — F321 Major depressive disorder, single episode, moderate: Secondary | ICD-10-CM

## 2015-03-01 DIAGNOSIS — I259 Chronic ischemic heart disease, unspecified: Secondary | ICD-10-CM | POA: Diagnosis not present

## 2015-03-01 DIAGNOSIS — F17210 Nicotine dependence, cigarettes, uncomplicated: Secondary | ICD-10-CM | POA: Insufficient documentation

## 2015-03-01 DIAGNOSIS — D638 Anemia in other chronic diseases classified elsewhere: Secondary | ICD-10-CM | POA: Diagnosis not present

## 2015-03-01 DIAGNOSIS — R1013 Epigastric pain: Principal | ICD-10-CM

## 2015-03-01 DIAGNOSIS — I480 Paroxysmal atrial fibrillation: Secondary | ICD-10-CM | POA: Diagnosis not present

## 2015-03-01 DIAGNOSIS — D696 Thrombocytopenia, unspecified: Secondary | ICD-10-CM | POA: Diagnosis present

## 2015-03-01 DIAGNOSIS — I10 Essential (primary) hypertension: Secondary | ICD-10-CM | POA: Diagnosis not present

## 2015-03-01 DIAGNOSIS — Z681 Body mass index (BMI) 19 or less, adult: Secondary | ICD-10-CM | POA: Diagnosis not present

## 2015-03-01 DIAGNOSIS — Z950 Presence of cardiac pacemaker: Secondary | ICD-10-CM | POA: Diagnosis not present

## 2015-03-01 DIAGNOSIS — K922 Gastrointestinal hemorrhage, unspecified: Secondary | ICD-10-CM

## 2015-03-01 DIAGNOSIS — R109 Unspecified abdominal pain: Secondary | ICD-10-CM

## 2015-03-01 DIAGNOSIS — I4891 Unspecified atrial fibrillation: Secondary | ICD-10-CM | POA: Diagnosis not present

## 2015-03-01 DIAGNOSIS — R195 Other fecal abnormalities: Secondary | ICD-10-CM

## 2015-03-01 DIAGNOSIS — G8929 Other chronic pain: Secondary | ICD-10-CM | POA: Insufficient documentation

## 2015-03-01 LAB — COMPREHENSIVE METABOLIC PANEL
ALT: 13 U/L — ABNORMAL LOW (ref 17–63)
AST: 26 U/L (ref 15–41)
Albumin: 3.6 g/dL (ref 3.5–5.0)
Alkaline Phosphatase: 85 U/L (ref 38–126)
Anion gap: 9 (ref 5–15)
BILIRUBIN TOTAL: 0.3 mg/dL (ref 0.3–1.2)
BUN: 21 mg/dL — AB (ref 6–20)
CO2: 23 mmol/L (ref 22–32)
CREATININE: 0.69 mg/dL (ref 0.61–1.24)
Calcium: 9 mg/dL (ref 8.9–10.3)
Chloride: 104 mmol/L (ref 101–111)
GFR calc Af Amer: >60 mL/min (ref >60)
Glucose, Bld: 116 mg/dL — ABNORMAL HIGH (ref 65–99)
POTASSIUM: 4.2 mmol/L (ref 3.5–5.1)
Sodium: 136 mmol/L (ref 135–145)
TOTAL PROTEIN: 7.5 g/dL (ref 6.5–8.1)

## 2015-03-01 LAB — TYPE AND SCREEN
ABO/RH(D): O POS
ANTIBODY SCREEN: NEGATIVE

## 2015-03-01 LAB — CBC
HEMATOCRIT: 37.1 % — AB (ref 39.0–52.0)
Hemoglobin: 11.5 g/dL — ABNORMAL LOW (ref 13.0–17.0)
MCH: 29.6 pg (ref 26.0–34.0)
MCHC: 31 g/dL (ref 30.0–36.0)
MCV: 95.6 fL (ref 78.0–100.0)
PLATELETS: 122 10*3/uL — AB (ref 150–400)
RBC: 3.88 MIL/uL — ABNORMAL LOW (ref 4.22–5.81)
RDW: 15 % (ref 11.5–15.5)
WBC: 5.8 10*3/uL (ref 4.0–10.5)

## 2015-03-01 LAB — POC OCCULT BLOOD, ED: Fecal Occult Bld: POSITIVE — AB

## 2015-03-01 LAB — PROTIME-INR
INR: 1.15 (ref 0.00–1.49)
Prothrombin Time: 14.9 seconds (ref 11.6–15.2)

## 2015-03-01 MED ORDER — ONDANSETRON HCL 4 MG/2ML IJ SOLN: 4.0000 mg | Freq: Four times a day (QID) | INTRAMUSCULAR | Status: DC | PRN

## 2015-03-01 MED ORDER — IOHEXOL 300 MG/ML  SOLN
25.0000 mL | INTRAMUSCULAR | Status: AC
  Administered 2015-03-01 (×2): 25 mL via ORAL

## 2015-03-01 MED ORDER — ALIGN 4 MG PO CAPS: 1.0000 | ORAL_CAPSULE | Freq: Four times a day (QID) | ORAL | Status: DC

## 2015-03-01 MED ORDER — ONDANSETRON HCL 4 MG PO TABS: 4.0000 mg | ORAL_TABLET | Freq: Four times a day (QID) | ORAL | Status: DC | PRN

## 2015-03-01 MED ORDER — LOPERAMIDE HCL 2 MG PO CAPS
2.0000 mg | ORAL_CAPSULE | Freq: Four times a day (QID) | ORAL | Status: DC | PRN
  Administered 2015-03-02: 2 mg via ORAL
  Filled 2015-03-01: qty 1

## 2015-03-01 MED ORDER — PNEUMOCOCCAL VAC POLYVALENT 25 MCG/0.5ML IJ INJ
0.5000 mL | INJECTION | INTRAMUSCULAR | Status: AC
  Administered 2015-03-02: 0.5 mL via INTRAMUSCULAR
  Filled 2015-03-01: qty 0.5

## 2015-03-01 MED ORDER — HYDROCODONE-ACETAMINOPHEN 5-325 MG PO TABS
1.0000 | ORAL_TABLET | ORAL | Status: DC | PRN
  Administered 2015-03-01 – 2015-03-02 (×4): 2 via ORAL
  Filled 2015-03-01 (×4): qty 2

## 2015-03-01 MED ORDER — RISAQUAD PO CAPS
1.0000 | ORAL_CAPSULE | Freq: Four times a day (QID) | ORAL | Status: DC
  Administered 2015-03-01 – 2015-03-02 (×3): 1 via ORAL
  Filled 2015-03-01 (×3): qty 1

## 2015-03-01 MED ORDER — HYDROCODONE-ACETAMINOPHEN 5-325 MG PO TABS
1.0000 | ORAL_TABLET | Freq: Two times a day (BID) | ORAL | Status: DC | PRN
  Administered 2015-03-01 (×2): 1 via ORAL
  Filled 2015-03-01 (×2): qty 1

## 2015-03-01 MED ORDER — INFLUENZA VAC SPLIT QUAD 0.5 ML IM SUSY
0.5000 mL | PREFILLED_SYRINGE | INTRAMUSCULAR | Status: AC
Start: 2015-03-02 — End: 2015-03-02
  Administered 2015-03-02: 0.5 mL via INTRAMUSCULAR
  Filled 2015-03-01: qty 0.5

## 2015-03-01 MED ORDER — CARVEDILOL 6.25 MG PO TABS
6.2500 mg | ORAL_TABLET | Freq: Two times a day (BID) | ORAL | Status: DC
  Administered 2015-03-01 – 2015-03-02 (×2): 6.25 mg via ORAL
  Filled 2015-03-01 (×2): qty 1

## 2015-03-01 MED ORDER — LOPERAMIDE HCL 2 MG PO TABS: 2.0000 mg | ORAL_TABLET | Freq: Four times a day (QID) | ORAL | Status: DC | PRN

## 2015-03-01 MED ORDER — IOHEXOL 300 MG/ML  SOLN
75.0000 mL | Freq: Once | INTRAMUSCULAR | Status: AC | PRN
  Administered 2015-03-02: 75 mL via INTRAVENOUS

## 2015-03-01 NOTE — ED Notes (Signed)
Pt arrives via [facility] for c/o lower abdominal pain and bright red blood in his stools this am.pt is currently on blood thinners, was discharged yesterday after having his pacemaker reset.

## 2015-03-01 NOTE — H&P (Addendum)
[facility] History and Physical  [name] WUJ:[id number] DOB: 1945/07/28 DOA: 03/01/2015  Referring physician: ED physician, Dr. Alvino Goodwin  PCP: Damon Poll, MD   Chief Complaint: abd pain   HPI:  Patient is 69 year old male with known depression, pacemaker, A. Fib no longer on AC due to ? Compliance, hyperlipidemia, hypertension, at least 7 visits emergency department in the past month for different symptoms, now presenting with one-day duration of lower quadrants abdominal pain, intermittent and throbbing in nature, 7/10 in severity when present, occasionally but not consistently radiating to entire abdominal area, associated with cramps, nausea and poor oral intake. Patient denies any specific alleviating factors. He has not taken anything over-the-counter for this. Patient also reports noticing some blood in stool in the past 24 hours. He denies chest pain or shortness of breath, no vomiting, no fevers or chills.  In emergency department, patient noted to be in some discomfort due to abdominal pain, vital signs notable for heart rate in 40s to 50s. Blood work notable for hemoglobin 11.5, BUN 21, platelets 122 K, otherwise unremarkable. FOBT +. Chest x-ray consistent with COPD but no active disease. TR[facility] asked to admit for further evaluation.  Assessment and Plan:  Principal Problem:   Abdominal pain, lower quadrants radiating to the epigastric area  - unclear etiology at this time, lipase is WNL, LFT's at target range, pt with fevers and normal WBC - will proceed with CT abd and pelvis for clearer evaluation - will provide analgesia as needed - advance diet if pt able to tolerate   Active Problems:   Thrombocytopenia - review of records indicate that plt have been as low as 120's in the recent 6 months - pt denies alcohol use, so not very clear why plt on low side - FOBT was positive in ED but no frank blood on rectal exam noted  - will need to monitor closely - CBC in  AM    DEPRESSION, MAJOR, MODERATE - appears to be stable at this time     Protein-calorie malnutrition, severe, underweight  - advance diet if pt able to tolerate  - nutritionist consulted  - BMI < 18    Anemia of chronic disease - monitor H/H a FOBT + - CBC in AM    Chronic ischemic heart disease - stable at this time    Atrial fibrillation with HR in 40-50's - has been off AC, was on Xarelto in the past - consider holding Coreg if HR < 45    Essential hypertension - initial BP 141/54 but now down to 101/62 - monitor closely and may need to hold Coreg if SBP < 90    DVT prophylaxis - SCD's   Radiological Exams on Admission: Dg Chest 2 View 02/27/2015   COPD.  No active disease.     Code Status: Full Family Communication: Pt at bedside Disposition Plan: Admit for further evaluation    [name] [facility] [phone]  Review of Systems:  Constitutional: Negative for fever, chills. Negative for diaphoresis.  HENT: Negative for hearing loss, ear pain, nosebleeds, congestion, sore throat, neck pain, tinnitus and ear discharge.   Eyes: Negative for blurred vision, double vision, photophobia, pain, discharge and redness.  Respiratory: Negative for cough, hemoptysis, sputum production, shortness of breath, wheezing and stridor.   Cardiovascular: Negative for chest pain, palpitations, orthopnea, claudication and leg swelling.  Gastrointestinal: Per HPI  Genitourinary: Negative for dysuria, urgency, frequency, hematuria and flank pain.  Musculoskeletal: Negative for myalgias, back pain, joint  pain and falls.  Skin: Negative for itching and rash.  Neurological: Negative for dizziness and weakness. Goodwin/Heme/Allergies: Negative for environmental allergies and polydipsia. Does not bruise/bleed easily.  Psychiatric/Behavioral: Negative for suicidal ideas. The patient is not nervous/anxious.      Past Medical History  Diagnosis Date  . Depression   . Hypomagnesemia   . Fall at  nursing home 05/27/2013    "slipped on water; said I broke my right hip" (05/27/2013)  . Migraine     "used to have them bad; haven't had one in awhile" (05/27/2013)  . Gout attack     "not too long ago; in my right foot" (05/27/2013)  . Complete heart block     reason for pacemaker/notes 05/27/2013  . Atrial fibrillation     [name] 05/27/2013  . HLD (hyperlipidemia)   . GIB (gastrointestinal bleeding)   . BPH (benign prostatic hyperplasia)   . Presence of permanent cardiac pacemaker   . Hypertension   . Gastric ulcer   . Non Hodgkin's lymphoma   . Tubular adenoma of colon     Past Surgical History  Procedure Laterality Date  . Insert / replace / remove pacemaker    . Appendectomy    . Cholecystectomy    . Open reduction of hip Right 05/28/2013    Procedure: OPEN REDUCTION OF HIP;  Surgeon: Damon Butters, MD;  Location: [place];  Service: Orthopedics;  Laterality: Right;  . Esophagogastroduodenoscopy (egd) with propofol N/A 09/26/2014    Procedure: ESOPHAGOGASTRODUODENOSCOPY (EGD) WITH PROPOFOL;  Surgeon: Damon Bears, MD;  Location: [facility] ENDOSCOPY;  Service: Endoscopy;  Laterality: N/A;  . Colonoscopy N/A 10/01/2014    Procedure: COLONOSCOPY;  Surgeon: Damon Bears, MD;  Location: [facility] ENDOSCOPY;  Service: Endoscopy;  Laterality: N/A;  . Partial colectomy N/A 10/05/2014    Procedure: SUBTOTAL COLECTOMY WITH ILEORECTAL ANASTOMOSIS;  Surgeon: Damon Mesa, MD;  Location: [place];  Service: General;  Laterality: N/A;  . Flexible sigmoidoscopy N/A 11/07/2014    Procedure: FLEXIBLE SIGMOIDOSCOPY;  Surgeon: Damon Bears, MD;  Location: [facility] ENDOSCOPY;  Service: Endoscopy;  Laterality: N/A;  . Gastric resection      Social History:  reports that he has been smoking Cigarettes.  He has a 33 pack-year smoking history. He has quit using smokeless tobacco. His smokeless tobacco use included Snuff and Chew. He reports that he uses illicit drugs (Marijuana). He reports that he does not drink  alcohol.  No Known Allergies  Family History  Problem Relation Age of Onset  . Colon cancer Mother     Prior to Admission medications   Medication Sig Start Date End Date Taking? Authorizing Provider  carvedilol (COREG) 6.25 MG tablet Take 1 tablet (6.25 mg total) by mouth 2 (two) times daily with a meal. Patient not taking: Reported on 02/28/2015 11/16/14   Velvet Bathe, MD  HYDROcodone-acetaminophen (NORCO/VICODIN) 5-325 MG per tablet Take 1 tablet by mouth 2 (two) times daily as needed for severe pain. 12/05/14   Everlene Balls, MD  Lactobacillus-Inulin (Harmon) CAPS Take 1 capsule by mouth 3 (three) times daily. Patient not taking: Reported on 02/27/2015 12/12/14   April Palumbo, MD  loperamide (IMODIUM A-D) 2 MG tablet Take 2 mg by mouth 4 (four) times daily as needed for diarrhea or loose stools.    Historical Provider, MD  permethrin (ELIMITE) 5 % cream Apply to affected area once Patient not taking: Reported on 02/28/2015 01/21/15   Lacretia Leigh, MD  Probiotic Product (  ALIGN) 4 MG CAPS Take 1 capsule by mouth 4 (four) times daily. Patient not taking: Reported on 02/27/2015 01/15/15   April Palumbo, MD    Physical Exam: Filed Vitals:   03/01/15 1146 03/01/15 1147 03/01/15 1215  BP: 132/76 132/76 112/52  Pulse: 86 59 77  Temp: 97.5 F (36.4 C)    TempSrc: Oral    Resp: 19  18  SpO2: 98% 98% 98%    Physical Exam  Constitutional: Appears cachectic and malnourished  HENT: Normocephalic. External right and left ear normal. Dry MM Eyes: Conjunctivae and EOM are normal. PERRLA, no scleral icterus.  Neck: Normal ROM. Neck supple. No JVD. No tracheal deviation. No thyromegaly.  CVS: Bradycardic, no murmurs, no gallops, no carotid bruit.  Pulmonary: Effort and breath sounds normal, no stridor, rhonchi, wheezes, rales.  Abdominal: Soft. BS +,  no distension, tenderness in epigastric area, no rebound or guarding.  Musculoskeletal: Normal range of motion. No edema and no  tenderness.  Lymphadenopathy: No lymphadenopathy noted, cervical, inguinal. Neuro: Alert. Normal reflexes, muscle tone coordination. No cranial nerve deficit. Skin: Skin is warm and dry. No rash noted. Not diaphoretic. No erythema. No pallor.  Psychiatric: Normal mood and affect. Behavior, judgment, thought content normal.   Labs on Admission:  Basic Metabolic Panel:  Recent Labs Lab 02/27/15 1555 03/01/15 1219  NA 140 136  K 5.0 4.2  CL 105 104  CO2 27 23  GLUCOSE 105* 116*  BUN 21* 21*  CREATININE 0.91 0.69  CALCIUM 9.6 9.0   Liver Function Tests:  Recent Labs Lab 02/27/15 1653 03/01/15 1219  AST 17 26  ALT 12* 13*  ALKPHOS 93 85  BILITOT 0.3 0.3  PROT 7.7 7.5  ALBUMIN 3.7 3.6    Recent Labs Lab 02/27/15 1653  LIPASE 48   CBC:  Recent Labs Lab 02/27/15 1555 03/01/15 1219  WBC 6.5 5.8  HGB 10.9* 11.5*  HCT 34.9* 37.1*  MCV 95.6 95.6  PLT 127* 122*   EKG: pending   If 7PM-7AM, please contact night-coverage [email or web address] Password [email or web address] 03/01/2015, 1:21 PM

## 2015-03-01 NOTE — ED Notes (Signed)
MD at bedside. 

## 2015-03-01 NOTE — ED Provider Notes (Signed)
CSN: [id number]     Arrival date & time 03/01/15  1144 History   First MD Initiated Contact with Patient 03/01/15 1157     Chief Complaint  Patient presents with  . Rectal Bleeding  . Abdominal Pain     (Consider location/radiation/quality/duration/timing/severity/associated sxs/prior Treatment) Patient is a 69 y.o. male presenting with hematochezia and abdominal pain. The history is provided by the patient.  Rectal Bleeding Quality:  Bright red Associated symptoms: abdominal pain   Associated symptoms: no fever and no vomiting   Abdominal Pain Associated symptoms: chest pain, diarrhea, hematochezia and shortness of breath   Associated symptoms: no fever, no nausea and no vomiting    is a patient with chronic abdominal pain and chronic rectal bleeding. Reportedly has been worse today. States his been bright red blood. 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 Has a history of GI bleeding. Has had 33 visits the last 6 months. Was here last night but states she was here for chest pain not for his abdominal pain. He is reportedly homeless. 3 months ago he was seen by general surgery and was going to follow-up he has been to the ER 21 times since that admission..  Past Medical History  Diagnosis Date  . Depression   . Hypomagnesemia   . Fall at nursing home 05/27/2013    "slipped on water; said I broke my right hip" (05/27/2013)  . Migraine     "used to have them bad; haven't had one in awhile" (05/27/2013)  . Gout attack     "not too long ago; in my right foot" (05/27/2013)  . Complete heart block     reason for pacemaker/notes 05/27/2013  . Atrial fibrillation     [name] 05/27/2013  . HLD (hyperlipidemia)   . GIB (gastrointestinal bleeding)   . BPH (benign prostatic hyperplasia)   . Presence of permanent cardiac pacemaker   . Hypertension   . Gastric ulcer   . Non Hodgkin's lymphoma   . Tubular adenoma of colon    Past Surgical History  Procedure Laterality Date  .  Insert / replace / remove pacemaker    . Appendectomy    . Cholecystectomy    . Open reduction of hip Right 05/28/2013    Procedure: OPEN REDUCTION OF HIP;  Surgeon: Renette Butters, MD;  Location: [place];  Service: Orthopedics;  Laterality: Right;  . Esophagogastroduodenoscopy (egd) with propofol N/A 09/26/2014    Procedure: ESOPHAGOGASTRODUODENOSCOPY (EGD) WITH PROPOFOL;  Surgeon: Jerene Bears, MD;  Location: [facility] ENDOSCOPY;  Service: Endoscopy;  Laterality: N/A;  . Colonoscopy N/A 10/01/2014    Procedure: COLONOSCOPY;  Surgeon: Jerene Bears, MD;  Location: [facility] ENDOSCOPY;  Service: Endoscopy;  Laterality: N/A;  . Partial colectomy N/A 10/05/2014    Procedure: SUBTOTAL COLECTOMY WITH ILEORECTAL ANASTOMOSIS;  Surgeon: Donnie Mesa, MD;  Location: [place];  Service: General;  Laterality: N/A;  . Flexible sigmoidoscopy N/A 11/07/2014    Procedure: FLEXIBLE SIGMOIDOSCOPY;  Surgeon: Jerene Bears, MD;  Location[facility] ENDOSCOPY;  Service: Endoscopy;  Laterality: N/A;  . Gastric resection     Family History  Problem Relation Age of Onset  . Colon cancer Mother    Social History  Substance Use Topics  . Smoking status: Current Some Day Smoker -- 0.50 packs/day for 66 years    Types: Cigarettes  . Smokeless tobacco: Former Systems developer    Types: Snuff, Chew     Comment: 05/27/2013 "haven't used chew or  snuff in ~ 30 yr"  . Alcohol Use: No     Comment: 05/27/2013 "none in years; never had problem w/it" - not in 30 years    Review of Systems  Constitutional: Positive for appetite change. Negative for fever and activity change.  Eyes: Negative for pain.  Respiratory: Positive for shortness of breath. Negative for chest tightness.   Cardiovascular: Positive for chest pain. Negative for leg swelling.  Gastrointestinal: Positive for abdominal pain, diarrhea, blood in stool and hematochezia. Negative for nausea and vomiting.  Genitourinary: Negative for flank pain.  Musculoskeletal: Negative for back pain and  neck stiffness.  Skin: Negative for rash.  Neurological: Negative for weakness, numbness and headaches.  Psychiatric/Behavioral: Negative for behavioral problems.      Allergies  Review of patient's allergies indicates no known allergies.  Home Medications   Prior to Admission medications   Medication Sig Start Date End Date Taking? Authorizing Provider  carvedilol (COREG) 6.25 MG tablet Take 1 tablet (6.25 mg total) by mouth 2 (two) times daily with a meal. 11/16/14  Yes Velvet Bathe, MD  finasteride (PROSCAR) 5 MG tablet Take 5 mg by mouth daily.   Yes Historical Provider, MD  warfarin (COUMADIN) 5 MG tablet Take 5 mg by mouth daily. At [place] Provider, MD   BP 137/65 mmHg  Pulse 71  Temp(Src) 98.7 F (37.1 C) (Oral)  Resp 22  SpO2 100% Physical Exam  Constitutional: He appears well-developed.  HENT:  Head: Atraumatic.  Eyes: Pupils are equal, round, and reactive to light.  Neck: Neck supple.  Cardiovascular: Normal rate.   Pulmonary/Chest: Effort normal.  Abdominal: Soft. There is tenderness.  Somewhat diffuse tenderness.  Neurological: He is alert.  Skin: Rash noted.    ED Course  Procedures (including critical care time) Labs Review Labs Reviewed  COMPREHENSIVE METABOLIC PANEL - Abnormal; Notable for the following:    Glucose, Bld 116 (*)    BUN 21 (*)    ALT 13 (*)    All other components within normal limits  CBC - Abnormal; Notable for the following:    RBC 3.88 (*)    Hemoglobin 11.5 (*)    HCT 37.1 (*)    Platelets 122 (*)    All other components within normal limits  BASIC METABOLIC PANEL - Abnormal; Notable for the following:    Sodium 131 (*)    Chloride 95 (*)    Glucose, Bld 106 (*)    All other components within normal limits  CBC - Abnormal; Notable for the following:    RBC 3.89 (*)    Hemoglobin 11.8 (*)    HCT 37.3 (*)    All other components within normal limits  POC OCCULT BLOOD, ED - Abnormal; Notable for the following:     Fecal Occult Bld POSITIVE (*)    All other components within normal limits  PROTIME-INR  TYPE AND SCREEN    Imaging Review Ct Abdomen Pelvis W Contrast  03/02/2015   CLINICAL DATA:  Epigastric pain, fever, bloody stool, history partial colectomy, atrial fibrillation, hypertension, non-Hodgkin's lymphoma, ulcer disease, cardiomyopathy  EXAM: CT ABDOMEN AND PELVIS WITH CONTRAST  TECHNIQUE: Multidetector CT imaging of the abdomen and pelvis was performed using the standard protocol following bolus administration of intravenous contrast. Sagittal and coronal MPR images reconstructed from axial data set.  CONTRAST:  68mL OMNIPAQUE IOHEXOL 300 MG/ML SOLN IV. Dilute oral contrast.  COMPARISON:  11/29/2014  FINDINGS: Lung bases clear.  Pacemaker lead  RIGHT ventricle.  Scattered atherosclerotic calcifications aorta and coronary arteries.  Gallbladder surgically absent with mild intrahepatic and extrahepatic biliary dilatation.  Liver, spleen, and LEFT adrenal gland normal.  Low-attenuation RIGHT adrenal nodule 12 x 11 mm unchanged.  Small BILATERAL renal cysts with tiny nonobstructing calculus inferior pole RIGHT kidney.  Few tiny scattered pancreatic parenchymal calcifications suggesting chronic calcific pancreatitis.  Stomach distended without outlet obstruction, oral contrast present throughout bowel to rectum.  Chronic infiltration of the perinephric fat inferior to the RIGHT kidney, slightly decreased since 11/29/2014, question sequela of prior hemorrhage or less likely inflammation.  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  Large and small bowel loops otherwise unremarkable.  Prior RIGHT hemicolectomy questioned.  Normal appearing bladder and ureters.  No mass, adenopathy, free air or free fluid.  Bones demineralized with orthopedic hardware RIGHT femur.  IMPRESSION:  Decrease in infiltrative changes in the RIGHT retroperitoneum involving the perinephric space inferior to the RIGHT kidney, question sequela of prior hemorrhage or less likely infection.  Biliary dilatation post cholecystectomy, recommend correlation with LFTs.  No definite acute intra-abdominal or intrapelvic abnormalities otherwise identified.  Tiny scattered parenchymal calcifications within pancreas question chronic calcific pancreatitis.   Electronically Signed   By: Lavonia Dana M.D.   On: 03/02/2015 02:59   I have personally reviewed and evaluated these images and lab results as part of my medical decision-making.   EKG Interpretation None      MDM   Final diagnoses:  Gastrointestinal hemorrhage, unspecified gastritis, unspecified gastrointestinal hemorrhage type  Chronic abdominal pain    Patient with rectal bleeding. Guaiac positive. Has history of noncompliance. Socially more acute access to a more definitive outcome may help since he does not appear to follow-up as an outpatient.    Davonna Belling, MD 03/03/15 [phone]

## 2015-03-02 DIAGNOSIS — R195 Other fecal abnormalities: Secondary | ICD-10-CM | POA: Diagnosis not present

## 2015-03-02 DIAGNOSIS — I4891 Unspecified atrial fibrillation: Secondary | ICD-10-CM | POA: Diagnosis not present

## 2015-03-02 DIAGNOSIS — R1032 Left lower quadrant pain: Secondary | ICD-10-CM | POA: Diagnosis not present

## 2015-03-02 DIAGNOSIS — D696 Thrombocytopenia, unspecified: Secondary | ICD-10-CM

## 2015-03-02 DIAGNOSIS — I10 Essential (primary) hypertension: Secondary | ICD-10-CM | POA: Diagnosis not present

## 2015-03-02 DIAGNOSIS — E43 Unspecified severe protein-calorie malnutrition: Secondary | ICD-10-CM

## 2015-03-02 DIAGNOSIS — R1013 Epigastric pain: Secondary | ICD-10-CM | POA: Diagnosis not present

## 2015-03-02 LAB — BASIC METABOLIC PANEL
Anion gap: 9 (ref 5–15)
BUN: 19 mg/dL (ref 6–20)
CALCIUM: 9.2 mg/dL (ref 8.9–10.3)
CHLORIDE: 95 mmol/L — AB (ref 101–111)
CO2: 27 mmol/L (ref 22–32)
Creatinine, Ser: 0.85 mg/dL (ref 0.61–1.24)
GFR calc Af Amer: >60 mL/min (ref >60)
GFR calc non Af Amer: >60 mL/min (ref >60)
Glucose, Bld: 106 mg/dL — ABNORMAL HIGH (ref 65–99)
Potassium: 3.8 mmol/L (ref 3.5–5.1)
Sodium: 131 mmol/L — ABNORMAL LOW (ref 135–145)

## 2015-03-02 LAB — CBC
HCT: 37.3 % — ABNORMAL LOW (ref 39.0–52.0)
HEMOGLOBIN: 11.8 g/dL — AB (ref 13.0–17.0)
MCH: 30.3 pg (ref 26.0–34.0)
MCHC: 31.6 g/dL (ref 30.0–36.0)
MCV: 95.9 fL (ref 78.0–100.0)
Platelets: 150 10*3/uL (ref 150–400)
RBC: 3.89 MIL/uL — AB (ref 4.22–5.81)
RDW: 15.1 % (ref 11.5–15.5)
WBC: 7.9 10*3/uL (ref 4.0–10.5)

## 2015-03-02 MED ORDER — RIVAROXABAN 15 MG PO TABS: 15.0000 mg | ORAL_TABLET | Freq: Every day | ORAL | Status: DC

## 2015-03-02 MED ORDER — DIPHENHYDRAMINE HCL 25 MG PO CAPS: 25.0000 mg | ORAL_CAPSULE | Freq: Every day | ORAL | Status: DC

## 2015-03-02 MED ORDER — ZOLPIDEM TARTRATE 5 MG PO TABS: 5.0000 mg | ORAL_TABLET | Freq: Every day | ORAL | Status: DC

## 2015-03-02 NOTE — Care Management Note (Signed)
Case Management Note  Patient Details  Name: [name] MRN: [id number] Date of Birth: [birth year]  Subjective/Objective:                    Action/Plan: No home health needs identified at present , will continue to follow .   Expected Discharge Date:                  Expected Discharge Plan:  Home/Self Care  In-House Referral:     Discharge planning Services     Post Acute Care Choice:    Choice offered to:     DME Arranged:    DME Agency:     HH Arranged:    HH Agency:     Status of Service:  In process, will continue to follow  Medicare Important Message Given:    Date Medicare IM Given:    Medicare IM give by:    Date Additional Medicare IM Given:    Additional Medicare Important Message give by:     If discussed at [place] of Stay Meetings, dates discussed:    Additional Comments:  Marilu Favre, RN 03/02/2015, 11:11 AM

## 2015-03-02 NOTE — Discharge Summary (Addendum)
Physician Discharge Summary  [name] WJX:[id number] DOB: April 23, 1946 DOA: 03/01/2015  PCP: [name], MD  Admit date: 03/01/2015 Discharge date: 03/02/2015  Time spent: 35 minutes   Patient is leaving AMA  Recommendations for Outpatient Follow-up:  1. Needs to f/u with gen surgery   Discharge Condition: stable  Discharge Diagnoses:  Principal Problem:   Abdominal pain Active Problems:   Positive fecal occult blood test   Thrombocytopenia   DEPRESSION, MAJOR, MODERATE   Chronic ischemic heart disease   Atrial fibrillation   Protein-calorie malnutrition, severe   Essential hypertension   Chronic anticoagulation   Brief narrative: [name] is a 69 y.o. male with depression, low-grade non-Hodgkin's B-cell lymphoma A. fib no longer on anticoagulation due to GI bleed in the past, pacemaker, hyperlipidemia, hypertension with numerous visits to the ER for various different complaints. The patient presents this time with a complaint of abdominal pain and bloody diarrhea. CT of the abdomen and pelvis obtained in the ER on 9/22 reveals possible prior hemorrhage versus infection of the right kidney. No acute GI inflammation or abnormalities noted which could be causing his symptoms.   Subjective: Patient states that he has has numerous loose/watery stools through the night. According to the RN, he had a solid bowel movement this morning and a few stools through the night. He has no complaints of nausea and vomiting and finished eating a regular diet for breakfast this AM. No fevers or chills. Has abdominal pain in left left lower abdomen  Assessment/Plan: Principal Problem:   LLQ Abdominal pain- bloody diarrhea? -According to RN, he had a solid stool this morning and has not many stools overnight (as the patient is reporting)-per RN, stool was not only solid but was nonbloody this morning -On exam, has some tenderness in the left lower quadrant - CT scan reports right sided  changes in perinephric space - on exam there is not discomfort in this area - no acute changes noted in GI tract on CT - further work up discussed with patient- unfortunately, after I left the room, the patient decided to leave AMA  Hemoccult-positive -The patient underwent subtotal abdominal colectomy for multiple polyps for high-grade dysplasia on 10/05/14 -Fecal occult blood was positive on 9/21-sigmoidoscopy in 5/16 revealed a rectal mass and a surgical consult was recommended at that time  - patient wanting to leave AMA- advised to f/u with surgery as outpt or allow me to contact surgery while he is in the hospital - he is not interested and has declined  Thrombocytopenia -  Has been low in the past (July of this year) - platelets normalized today   DEPRESSION, MAJOR, MODERATE - appears to be stable at this time    Protein-calorie malnutrition, severe, underweight    Anemia  - due to chronic blood loss vs AOCD-    Chronic ischemic heart disease - stable at this time   Atrial fibrillation with HR in 40-50's - has been off AC (non-compliant?)- was on Xarelto in the past - the patient is unable to tell me what medications he is on- no rate controlling agent mentioned on med list   Homeless - patient asked for assistance in finding a place to stay - I did request a case management & social work consult prior to his leaving   NOTE: unable to review medication list with patient- he is no aware of which medications he takes - tells me that he does not take any medications if he does  not "feel like it"   Discharge Exam: There were no vitals filed for this visit. Filed Vitals:   03/02/15 0503  BP: 137/65  Pulse: 71  Temp: 98.7 F (37.1 C)  Resp: 22    General: AAO x 3, no distress Cardiovascular: RRR, no murmurs  Respiratory: clear to auscultation bilaterally GI: soft, tender in LLQ, non-distended, bowel sound positive  Discharge Instructions You were cared for  by a hospitalist during your hospital stay. If you have any questions about your discharge medications or the care you received while you were in the hospital after you are discharged, you can call the unit and asked to speak with the hospitalist on call if the hospitalist that took care of you is not available. Once you are discharged, your primary care physician will handle any further medical issues. Please note that NO REFILLS for any discharge medications will be authorized once you are discharged, as it is imperative that you return to your primary care physician (or establish a relationship with a primary care physician if you do not have one) for your aftercare needs so that they can reassess your need for medications and monitor your lab values.    No Known Allergies    The results of significant diagnostics from this hospitalization (including imaging, microbiology, ancillary and laboratory) are listed below for reference.    Significant Diagnostic Studies: Dg Chest 2 View  02/27/2015   CLINICAL DATA:  Chest pain for 1 day  EXAM: CHEST  2 VIEW  COMPARISON:  01/25/2015  FINDINGS: There is hyperinflation of the lungs compatible with COPD. Right pacer remains in place, unchanged. Heart and mediastinal contours are within normal limits. No focal opacities or effusions. No acute bony abnormality.  IMPRESSION: COPD.  No active disease.   Electronically Signed   By: Rolm Baptise M.D.   On: 02/27/2015 16:18   Ct Abdomen Pelvis W Contrast  03/02/2015   CLINICAL DATA:  Epigastric pain, fever, bloody stool, history partial colectomy, atrial fibrillation, hypertension, non-Hodgkin's lymphoma, ulcer disease, cardiomyopathy  EXAM: CT ABDOMEN AND PELVIS WITH CONTRAST  TECHNIQUE: Multidetector CT imaging of the abdomen and pelvis was performed using the standard protocol following bolus administration of intravenous contrast. Sagittal and coronal MPR images reconstructed from axial data set.  CONTRAST:   9mL OMNIPAQUE IOHEXOL 300 MG/ML SOLN IV. Dilute oral contrast.  COMPARISON:  11/29/2014  FINDINGS: Lung bases clear.  Pacemaker lead RIGHT ventricle.  Scattered atherosclerotic calcifications aorta and coronary arteries.  Gallbladder surgically absent with mild intrahepatic and extrahepatic biliary dilatation.  Liver, spleen, and LEFT adrenal gland normal.  Low-attenuation RIGHT adrenal nodule 12 x 11 mm unchanged.  Small BILATERAL renal cysts with tiny nonobstructing calculus inferior pole RIGHT kidney.  Few tiny scattered pancreatic parenchymal calcifications suggesting chronic calcific pancreatitis.  Stomach distended without outlet obstruction, oral contrast present throughout bowel to rectum.  Chronic infiltration of the perinephric fat inferior to the RIGHT kidney, slightly decreased since 11/29/2014, question sequela of prior hemorrhage or less likely inflammation.  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  Large and small bowel loops otherwise unremarkable.  Prior RIGHT hemicolectomy questioned.  Normal appearing bladder and ureters.  No mass, adenopathy, free air or free fluid.  Bones demineralized with orthopedic hardware RIGHT femur.  IMPRESSION: Decrease in infiltrative changes in the RIGHT retroperitoneum involving the perinephric space inferior to the RIGHT  kidney, question sequela of prior hemorrhage or less likely infection.  Biliary dilatation post cholecystectomy, recommend correlation with LFTs.  No definite acute intra-abdominal or intrapelvic abnormalities otherwise identified.  Tiny scattered parenchymal calcifications within pancreas question chronic calcific pancreatitis.   Electronically Signed   By: Lavonia Dana M.D.   On: 03/02/2015 02:59    Microbiology: No results found for this or any previous visit (from the past 240 hour(s)).   Labs: Basic  Metabolic Panel:  Recent Labs Lab 02/27/15 1555 03/01/15 1219 03/02/15 0450  NA 140 136 131*  K 5.0 4.2 3.8  CL 105 104 95*  CO2 27 23 27   GLUCOSE 105* 116* 106*  BUN 21* 21* 19  CREATININE 0.91 0.69 0.85  CALCIUM 9.6 9.0 9.2   Liver Function Tests:  Recent Labs Lab 02/27/15 1653 03/01/15 1219  AST 17 26  ALT 12* 13*  ALKPHOS 93 85  BILITOT 0.3 0.3  PROT 7.7 7.5  ALBUMIN 3.7 3.6    Recent Labs Lab 02/27/15 1653  LIPASE 48   No results for input(s): AMMONIA in the last 168 hours. CBC:  Recent Labs Lab 02/27/15 1555 03/01/15 1219 03/02/15 0450  WBC 6.5 5.8 7.9  HGB 10.9* 11.5* 11.8*  HCT 34.9* 37.1* 37.3*  MCV 95.6 95.6 95.9  PLT 127* 122* 150   Cardiac Enzymes: No results for input(s): CKTOTAL, CKMB, CKMBINDEX, TROPONINI in the last 168 hours. BNP: BNP (last 3 results) No results for input(s): BNP in the last 8760 hours.  ProBNP (last 3 results) No results for input(s): PROBNP in the last 8760 hours.  CBG: No results for input(s): GLUCAP in the last 168 hours.     SignedDebbe Odea, MD [facility] 03/02/2015, 4:53 PM

## 2015-03-02 NOTE — Discharge Planning (Signed)
Pt states he wants to leave. Explained that he has not been discharged.  MD Dr Wynelle Cleveland notified, pt will need to go AMA. AMA form signed and SL dc'd. Dc'd amb to outside, he will catch the bus he says.

## 2015-03-03 ENCOUNTER — Emergency Department (HOSPITAL_COMMUNITY): Payer: Medicare Other

## 2015-03-03 ENCOUNTER — Encounter (HOSPITAL_COMMUNITY): Payer: Self-pay | Admitting: Emergency Medicine

## 2015-03-03 ENCOUNTER — Emergency Department (HOSPITAL_COMMUNITY)
Admission: EM | Admit: 2015-03-03 | Discharge: 2015-03-03 | Disposition: A | Discharge status: Home / Self Care | Payer: Medicare Other | Attending: Emergency Medicine | Admitting: Emergency Medicine

## 2015-03-03 DIAGNOSIS — Z8572 Personal history of non-Hodgkin lymphomas: Secondary | ICD-10-CM | POA: Insufficient documentation

## 2015-03-03 DIAGNOSIS — R05 Cough: Secondary | ICD-10-CM | POA: Diagnosis not present

## 2015-03-03 DIAGNOSIS — S00211A Abrasion of right eyelid and periocular area, initial encounter: Secondary | ICD-10-CM | POA: Insufficient documentation

## 2015-03-03 DIAGNOSIS — S80811A Abrasion, right lower leg, initial encounter: Secondary | ICD-10-CM | POA: Diagnosis not present

## 2015-03-03 DIAGNOSIS — Z87438 Personal history of other diseases of male genital organs: Secondary | ICD-10-CM | POA: Insufficient documentation

## 2015-03-03 DIAGNOSIS — S40811A Abrasion of right upper arm, initial encounter: Secondary | ICD-10-CM | POA: Diagnosis not present

## 2015-03-03 DIAGNOSIS — Y9389 Activity, other specified: Secondary | ICD-10-CM | POA: Insufficient documentation

## 2015-03-03 DIAGNOSIS — W19XXXA Unspecified fall, initial encounter: Secondary | ICD-10-CM

## 2015-03-03 DIAGNOSIS — W1839XA Other fall on same level, initial encounter: Secondary | ICD-10-CM | POA: Diagnosis not present

## 2015-03-03 DIAGNOSIS — Z8679 Personal history of other diseases of the circulatory system: Secondary | ICD-10-CM | POA: Insufficient documentation

## 2015-03-03 DIAGNOSIS — D649 Anemia, unspecified: Secondary | ICD-10-CM | POA: Insufficient documentation

## 2015-03-03 DIAGNOSIS — Y9289 Other specified places as the place of occurrence of the external cause: Secondary | ICD-10-CM | POA: Diagnosis not present

## 2015-03-03 DIAGNOSIS — T07XXXA Unspecified multiple injuries, initial encounter: Secondary | ICD-10-CM

## 2015-03-03 DIAGNOSIS — Z8639 Personal history of other endocrine, nutritional and metabolic disease: Secondary | ICD-10-CM | POA: Insufficient documentation

## 2015-03-03 DIAGNOSIS — Y998 Other external cause status: Secondary | ICD-10-CM | POA: Insufficient documentation

## 2015-03-03 DIAGNOSIS — R0789 Other chest pain: Secondary | ICD-10-CM

## 2015-03-03 DIAGNOSIS — Z950 Presence of cardiac pacemaker: Secondary | ICD-10-CM | POA: Diagnosis not present

## 2015-03-03 DIAGNOSIS — Z8719 Personal history of other diseases of the digestive system: Secondary | ICD-10-CM | POA: Insufficient documentation

## 2015-03-03 DIAGNOSIS — Z8739 Personal history of other diseases of the musculoskeletal system and connective tissue: Secondary | ICD-10-CM | POA: Diagnosis not present

## 2015-03-03 DIAGNOSIS — R059 Cough, unspecified: Secondary | ICD-10-CM

## 2015-03-03 DIAGNOSIS — I10 Essential (primary) hypertension: Secondary | ICD-10-CM | POA: Insufficient documentation

## 2015-03-03 DIAGNOSIS — Z720 Tobacco use: Secondary | ICD-10-CM | POA: Diagnosis not present

## 2015-03-03 DIAGNOSIS — S80812A Abrasion, left lower leg, initial encounter: Secondary | ICD-10-CM | POA: Diagnosis not present

## 2015-03-03 DIAGNOSIS — Z79899 Other long term (current) drug therapy: Secondary | ICD-10-CM | POA: Insufficient documentation

## 2015-03-03 DIAGNOSIS — I482 Chronic atrial fibrillation, unspecified: Secondary | ICD-10-CM

## 2015-03-03 DIAGNOSIS — S40812A Abrasion of left upper arm, initial encounter: Secondary | ICD-10-CM | POA: Insufficient documentation

## 2015-03-03 DIAGNOSIS — Z8659 Personal history of other mental and behavioral disorders: Secondary | ICD-10-CM | POA: Insufficient documentation

## 2015-03-03 DIAGNOSIS — S0083XA Contusion of other part of head, initial encounter: Secondary | ICD-10-CM | POA: Diagnosis not present

## 2015-03-03 DIAGNOSIS — Z5900 Homelessness unspecified: Secondary | ICD-10-CM

## 2015-03-03 DIAGNOSIS — Z86018 Personal history of other benign neoplasm: Secondary | ICD-10-CM | POA: Insufficient documentation

## 2015-03-03 DIAGNOSIS — S299XXA Unspecified injury of thorax, initial encounter: Secondary | ICD-10-CM | POA: Diagnosis present

## 2015-03-03 DIAGNOSIS — Z7901 Long term (current) use of anticoagulants: Secondary | ICD-10-CM | POA: Diagnosis not present

## 2015-03-03 LAB — BASIC METABOLIC PANEL
ANION GAP: 9 (ref 5–15)
BUN: 14 mg/dL (ref 6–20)
CHLORIDE: 107 mmol/L (ref 101–111)
CO2: 22 mmol/L (ref 22–32)
Calcium: 9.1 mg/dL (ref 8.9–10.3)
Creatinine, Ser: 0.74 mg/dL (ref 0.61–1.24)
Glucose, Bld: 112 mg/dL — ABNORMAL HIGH (ref 65–99)
POTASSIUM: 3.6 mmol/L (ref 3.5–5.1)
SODIUM: 138 mmol/L (ref 135–145)

## 2015-03-03 LAB — DIFFERENTIAL

## 2015-03-03 LAB — CBC
HEMATOCRIT: 31.6 % — AB (ref 39.0–52.0)
HEMOGLOBIN: 10 g/dL — AB (ref 13.0–17.0)
MCH: 29.5 pg (ref 26.0–34.0)
MCHC: 31.6 g/dL (ref 30.0–36.0)
MCV: 93.2 fL (ref 78.0–100.0)
Platelets: 131 10*3/uL — ABNORMAL LOW (ref 150–400)
RBC: 3.39 MIL/uL — AB (ref 4.22–5.81)
RDW: 15.2 % (ref 11.5–15.5)
WBC: 5.9 10*3/uL (ref 4.0–10.5)

## 2015-03-03 LAB — I-STAT TROPONIN, ED: Troponin i, poc: 0 ng/mL (ref 0.00–0.08)

## 2015-03-03 LAB — PROTIME-INR
INR: 1.16 (ref 0.00–1.49)
Prothrombin Time: 15 seconds (ref 11.6–15.2)

## 2015-03-03 LAB — ETHANOL

## 2015-03-03 MED ORDER — GI COCKTAIL ~~LOC~~
30.0000 mL | Freq: Once | ORAL | Status: AC
  Administered 2015-03-03: 30 mL via ORAL
  Filled 2015-03-03: qty 30

## 2015-03-03 MED ORDER — ACETAMINOPHEN 325 MG PO TABS
650.0000 mg | ORAL_TABLET | Freq: Once | ORAL | Status: AC
  Administered 2015-03-03: 650 mg via ORAL
  Filled 2015-03-03: qty 2

## 2015-03-03 NOTE — ED Notes (Signed)
Pt states he takes coumadin but it is not listed on PTA meds

## 2015-03-03 NOTE — ED Provider Notes (Signed)
  Face-to-face evaluation   History: He complains of chest and abdominal pain for an undetermined period of time. He was discharged from the hospital yesterday after evaluation for rectal bleeding. Discharge status was AMA. He was referred to general surgery.  Physical exam: Alert, disheveled uncomfortable. Heart regular rate and rhythm, no murmur. Lungs clear anteriorly. Abdomen mild diffuse tenderness. Skin with diffuse scattered ulcerations, consistent with MRSA.  Medical screening examination/treatment/procedure(s) were conducted as a shared visit with non-physician practitioner(s) and myself.  I personally evaluated the patient during the encounter  Daleen Bo, MD 03/03/15 2325

## 2015-03-03 NOTE — ED Notes (Signed)
Pt able to dress independently and ambulate to wheelchair.   

## 2015-03-03 NOTE — ED Notes (Signed)
Pt called out and stated he was dressed and ready to go.  This RN asked patient if he needed anything explained on his discharge paperwork.  Pt states he understands everything and has the paperwork provided to him from [name] for his assistance on Monday finding a place to live.  Pt took clothes provided by  Mariann Laster with Social Work with him.

## 2015-03-03 NOTE — ED Provider Notes (Signed)
CSN: [id number]     Arrival date & time 03/03/15  1403 History   First MD Initiated Contact with Patient 03/03/15 1510     Chief Complaint  Patient presents with  . Chest Pain  . Fall    this am.  fell on train tracks.     (Consider location/radiation/quality/duration/timing/severity/associated sxs/prior Treatment) HPI Comments: [name] is a 69 y.o. male with a PMHx of depression, migraines, gout, complete heart block s/p pacemaker insertion, afib (?coumadin), HTN, BPH, GI bleeding, and recent hx of nonhodgkin's lymphoma/cecal mass s/p resection, who is currently homeless, who presents to the ED with complaints of chest pain 2 hours prior to arrival. LEVEL 5 CAVEAT: pt poor historian, difficult to obtain good history. He reports this chest pain similar to prior chest pains that he has been seen for the past, multiple ER visits for the symptoms, including an admission in June during which he left AMA. He describes the pain is 8/10 constant central nonradiating sharp pain worse with coughing and improved somewhat with Vicodin, unrelieved with nitroglycerin and aspirin given prior to arrival. He reports that this chest pain started while at rest. Additionally he reports having a productive cough 1 day since receiving the flu shot, yellow sputum production. He also states that this morning he fell on the train tracks, denies any head injury or loss of consciousness, but has a bruise to his face and abrasions all over his body. He is currently homeless.  He denies any fevers, chills, lightheadedness, diaphoresis, wheezing, leg swelling, recent travel/surgery/immobilization, history of DVT/PE, shortness of breath, claudication, orthopnea, abdominal pain, nausea, vomiting, diarrhea, constipation, dysuria, hematuria, numbness, tingling, weakness, or illicit drug use. Denies any headache or vision changes. He admits to being a current smoker. He also states that he takes Coumadin 5 mg daily although  this is not on his current medication list. No family history of cardiac disease. Has never had a heart cath or echo. He doesn't know what brand his pacemaker is. Doesn't take anything for his Afib.  Patient is a 69 y.o. male presenting with chest pain and fall. The history is provided by the patient and medical records. No language interpreter was used.  Chest Pain Pain location:  Substernal area Pain quality: sharp   Pain radiates to:  Does not radiate Pain radiates to the back: no   Pain severity:  Moderate Onset quality:  Gradual Duration:  3 hours Timing:  Constant Progression:  Unchanged Chronicity:  Recurrent Context: at rest   Relieved by: vicodin. Worsened by:  Coughing Ineffective treatments:  Aspirin and nitroglycerin Associated symptoms: cough   Associated symptoms: no abdominal pain, no back pain, no claudication, no diaphoresis, no fever, no headache, no lower extremity edema, no nausea, no near-syncope, no numbness, no orthopnea, no PND, no shortness of breath, no syncope, not vomiting and no weakness   Risk factors: hypertension, male sex and smoking   Risk factors: no coronary artery disease, no diabetes mellitus, no high cholesterol, no immobilization, no prior DVT/PE and no surgery   Fall Associated symptoms include chest pain and coughing. Pertinent negatives include no abdominal pain, arthralgias, chills, diaphoresis, fever, headaches, myalgias, nausea, neck pain, numbness, vomiting or weakness.    Past Medical History  Diagnosis Date  . Depression   . Hypomagnesemia   . Fall at nursing home 05/27/2013    "slipped on water; said I broke my right hip" (05/27/2013)  . Migraine     "used to have them bad;  haven't had one in awhile" (05/27/2013)  . Gout attack     "not too long ago; in my right foot" (05/27/2013)  . Complete heart block     reason for pacemaker/notes 05/27/2013  . Atrial fibrillation     [name] 05/27/2013  . HLD (hyperlipidemia)   . GIB  (gastrointestinal bleeding)   . BPH (benign prostatic hyperplasia)   . Presence of permanent cardiac pacemaker   . Hypertension   . Gastric ulcer   . Non Hodgkin's lymphoma   . Tubular adenoma of colon    Past Surgical History  Procedure Laterality Date  . Insert / replace / remove pacemaker    . Appendectomy    . Cholecystectomy    . Open reduction of hip Right 05/28/2013    Procedure: OPEN REDUCTION OF HIP;  Surgeon: Renette Butters, MD;  Location: [facility];  Service: Orthopedics;  Laterality: Right;  . Esophagogastroduodenoscopy (egd) with propofol N/A 09/26/2014    Procedure: ESOPHAGOGASTRODUODENOSCOPY (EGD) WITH PROPOFOL;  Surgeon: Jerene Bears, MD;  Location: [facility] ENDOSCOPY;  Service: Endoscopy;  Laterality: N/A;  . Colonoscopy N/A 10/01/2014    Procedure: COLONOSCOPY;  Surgeon: Jerene Bears, MD;  Location[facility]COPY;  Service: Endoscopy;  Laterality: N/A;  . Partial colectomy N/A 10/05/2014    Procedure: SUBTOTAL COLECTOMY WITH ILEORECTAL ANASTOMOSIS;  Surgeon: Donnie Mesa, MD;  Location: [facility];  Service: General;  Laterality: N/A;  . Flexible sigmoidoscopy N/A 11/07/2014    Procedure: FLEXIBLE SIGMOIDOSCOPY;  Surgeon: Jerene Bears, MD;  Location: [facility] ENDOSCOPY;  Service: Endoscopy;  Laterality: N/A;  . Gastric resection     Family History  Problem Relation Age of Onset  . Colon cancer Mother    Social History  Substance Use Topics  . Smoking status: Current Some Day Smoker -- 0.50 packs/day for 66 years    Types: Cigarettes  . Smokeless tobacco: Former Systems developer    Types: Snuff, Chew     Comment: 05/27/2013 "haven't used chew or snuff in ~ 30 yr"  . Alcohol Use: No     Comment: 05/27/2013 "none in years; never had problem w/it" - not in 30 years     LEVEL 5 CAVEAT: pt poor historian, difficult to obtain good history Review of Systems  Constitutional: Negative for fever, chills and diaphoresis.  Eyes: Negative for visual disturbance.  Respiratory: Positive for cough.  Negative for shortness of breath and wheezing.   Cardiovascular: Positive for chest pain. Negative for orthopnea, claudication, leg swelling, syncope, PND and near-syncope.  Gastrointestinal: Negative for nausea, vomiting, abdominal pain, diarrhea and constipation.  Genitourinary: Negative for dysuria and hematuria.  Musculoskeletal: Negative for myalgias, back pain, arthralgias and neck pain.  Skin: Positive for color change (bruise R eye) and wound (abrasions).  Allergic/Immunologic: Positive for immunocompromised state (newly diagnosed lymphoma, unclear if on treatment).  Neurological: Negative for syncope, weakness, light-headedness, numbness and headaches.  Psychiatric/Behavioral: Negative for confusion.   10 Systems reviewed and are negative for acute change except as noted in the HPI.    Allergies  Review of patient's allergies indicates no known allergies.  Home Medications   Prior to Admission medications   Medication Sig Start Date End Date Taking? Authorizing Provider  carvedilol (COREG) 6.25 MG tablet Take 1 tablet (6.25 mg total) by mouth 2 (two) times daily with a meal. 11/16/14  Yes Velvet Bathe, MD  finasteride (PROSCAR) 5 MG tablet Take 5 mg by mouth daily.   Yes Historical Provider, MD  warfarin (COUMADIN)  5 MG tablet Take 5 mg by mouth daily. At 9am   Yes Historical Provider, MD   BP 105/66 mmHg  Pulse 84  Temp(Src) 98.2 F (36.8 C) (Oral)  Resp 27  Ht 5\' 9"  (1.753 m)  Wt 80 lb (36.288 kg)  BMI 11.81 kg/m2  SpO2 98% Physical Exam  Constitutional: He is oriented to person, place, and time. Vital signs are normal. He appears well-developed and well-nourished.  Non-toxic appearance. No distress.  Afebrile, nontoxic, NAD  HENT:  Head: Normocephalic. Head is with abrasion and with contusion.    Mouth/Throat: Oropharynx is clear and moist and mucous membranes are normal.  Multiple abrasions to face, arms, and legs. No scalp TTP or crepitus. Small bruise to just  under R eye, no tenderness or crepitus.  Eyes: Conjunctivae and EOM are normal. Pupils are equal, round, and reactive to light. Right eye exhibits no discharge. Left eye exhibits no discharge.  Neck: Normal range of motion. Neck supple. No spinous process tenderness and no muscular tenderness present. No rigidity. Normal range of motion present.  Cardiovascular: Normal rate, normal heart sounds and intact distal pulses.  An irregularly irregular rhythm present. Exam reveals no gallop and no friction rub.   No murmur heard. Irregularly irregular rhythm with regular rate, nl s1/s2, no m/r/g, distal pulses intact, no pedal edema  Pulmonary/Chest: Effort normal. No respiratory distress. He has no decreased breath sounds. He has no wheezes. He has rhonchi (that clear with cough). He has no rales. He exhibits tenderness. He exhibits no crepitus, no deformity and no retraction.    Transmitted upper airway sounds/rhonchi that clear with cough in upper fields, no wheezes/rales, no hypoxia or increased WOB, speaking in full sentences, SpO2 98% on RA Chest wall with minimal TTP to anterior L side, no crepitus/retraction/deformity  Abdominal: Soft. Normal appearance and bowel sounds are normal. He exhibits no distension. There is no tenderness. There is no rigidity, no rebound, no guarding, no CVA tenderness, no tenderness at McBurney's point and negative Murphy's sign.  Musculoskeletal: Normal range of motion.  MAE x4 Strength and sensation grossly intact Distal pulses intact No pedal edema, neg homan's bilaterally   Neurological: He is alert and oriented to person, place, and time. He has normal strength. No sensory deficit.  No focal neuro deficits  Skin: Skin is warm and dry. Abrasion noted. No rash noted.  Multiple abrasions to arms, legs, and face. Bruise to under R eye as noted above  Psychiatric: He has a normal mood and affect.  Nursing note and vitals reviewed.   ED Course  Procedures  (including critical care time) Labs Review Labs Reviewed  BASIC METABOLIC PANEL - Abnormal; Notable for the following:    Glucose, Bld 112 (*)    All other components within normal limits  CBC - Abnormal; Notable for the following:    RBC 3.39 (*)    Hemoglobin 10.0 (*)    HCT 31.6 (*)    Platelets 131 (*)    All other components within normal limits  PROTIME-INR  ETHANOL  DIFFERENTIAL  Randolm Idol, ED    Imaging Review Dg Chest 2 View  03/03/2015   CLINICAL DATA:  Chest pain.  EXAM: CHEST  2 VIEW  COMPARISON:  February 27, 2015.  FINDINGS: The heart size and mediastinal contours are within normal limits. Both lungs are clear. No pneumothorax or pleural effusion is noted. Right-sided pacemaker is unchanged in position. Hyperexpansion of the lungs is noted consistent with chronic obstructive  pulmonary disease. The visualized skeletal structures are unremarkable.  IMPRESSION: Findings consistent with chronic obstructive pulmonary disease. No acute cardiopulmonary abnormality seen.   Electronically Signed   By: Marijo Conception, M.D.   On: 03/03/2015 14:56   Ct Head Wo Contrast  03/03/2015   CLINICAL DATA:  Chest pain for the past 2 hr. Productive cough. [place] this morning on train tracks with multiple abrasions.  EXAM: CT HEAD WITHOUT CONTRAST  TECHNIQUE: Contiguous axial images were obtained from the base of the skull through the vertex without intravenous contrast.  COMPARISON:  11/06/2014.  FINDINGS: Diffusely enlarged ventricles and subarachnoid spaces. Patchy white matter low density in both cerebral hemispheres. No skull fracture, intracranial hemorrhage, mass lesion, CT evidence of acute infarction or paranasal sinus air-fluid levels. Minimal bilateral anterior ethmoid sinus mucosal thickening.  IMPRESSION: 1. No acute abnormality. 2. Stable mild diffuse cerebral and cerebellar atrophy and minimal chronic small vessel white matter ischemic changes in both cerebral hemispheres. 3.  Minimal chronic bilateral ethmoid sinusitis.   Electronically Signed   By: Claudie Revering M.D.   On: 03/03/2015 18:09   Ct Abdomen Pelvis W Contrast  03/02/2015   CLINICAL DATA:  Epigastric pain, fever, bloody stool, history partial colectomy, atrial fibrillation, hypertension, non-Hodgkin's lymphoma, ulcer disease, cardiomyopathy  EXAM: CT ABDOMEN AND PELVIS WITH CONTRAST  TECHNIQUE: Multidetector CT imaging of the abdomen and pelvis was performed using the standard protocol following bolus administration of intravenous contrast. Sagittal and coronal MPR images reconstructed from axial data set.  CONTRAST:  66mL OMNIPAQUE IOHEXOL 300 MG/ML SOLN IV. Dilute oral contrast.  COMPARISON:  11/29/2014  FINDINGS: Lung bases clear.  Pacemaker lead RIGHT ventricle.  Scattered atherosclerotic calcifications aorta and coronary arteries.  Gallbladder surgically absent with mild intrahepatic and extrahepatic biliary dilatation.  Liver, spleen, and LEFT adrenal gland normal.  Low-attenuation RIGHT adrenal nodule 12 x 11 mm unchanged.  Small BILATERAL renal cysts with tiny nonobstructing calculus inferior pole RIGHT kidney.  Few tiny scattered pancreatic parenchymal calcifications suggesting chronic calcific pancreatitis.  Stomach distended without outlet obstruction, oral contrast present throughout bowel to rectum.  Chronic infiltration of the perinephric fat inferior to the RIGHT kidney, slightly decreased since 11/29/2014, question sequela of prior hemorrhage or less likely inflammation.  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  Large and small bowel loops otherwise unremarkable.  Prior RIGHT hemicolectomy questioned.  Normal appearing bladder and ureters.  No mass, adenopathy, free air or free fluid.  Bones demineralized with orthopedic hardware RIGHT femur.  IMPRESSION: Decrease in  infiltrative changes in the RIGHT retroperitoneum involving the perinephric space inferior to the RIGHT kidney, question sequela of prior hemorrhage or less likely infection.  Biliary dilatation post cholecystectomy, recommend correlation with LFTs.  No definite acute intra-abdominal or intrapelvic abnormalities otherwise identified.  Tiny scattered parenchymal calcifications within pancreas question chronic calcific pancreatitis.   Electronically Signed   By: Lavonia Dana M.D.   On: 03/02/2015 02:59   I have personally reviewed and evaluated these images and lab results as part of my medical decision-making.   EKG Interpretation   Date/Time:  Friday March 03 2015 14:05:18 EDT Ventricular Rate:  88 PR Interval:    QRS Duration: 101 QT Interval:  356 QTC Calculation: 431 R Axis:   83 Text Interpretation:  Atrial fibrillation Anterior infarct, old No  significant change since last tracing Confirmed by Zenia Resides  MD, ANTHONY  ([id number]) on 03/03/2015 2:28:56 PM      MDM   Final diagnoses:  Atypical chest pain  Chronic a-fib  Anemia, unspecified anemia type  Abrasions of multiple sites  Fall, initial encounter  Cough  Tobacco use  Homelessness    69 y.o. male here with recurrent CP. Has been seen multiple times in the last few weeks for similar complaints. Reportedly left AMA several days ago for an admission for GI bleeding, and after one of his visits for CP. Reports he fell today, has bruise under R eye. Claims he's on coumadin. EKG unchanged from prior. CXR with no acute findings, some COPD changes. Will obtain labs, trop, INR, and head CT. Will also get ethanol level given pt being poor historian and somewhat unreliable, will see if alcohol is playing a part. On exam, CP is reproducible. Pt has no wheezing but some rhonchi that clear with cough in the upper lobes. Doubt need for nebs. Will give tylenol and GI cocktail and reassess shortly.   6:37 PM INR subtherapeutic, I doubt pt is  actually taking his coumadin, or he's on something else (prior notes have stated that he's on xarelto). EtOH level WNL. BMP WNL. CBC with anemia, Hgb 10 down from 11.8, still needs to f/up with GI for his ongoing GI bleeding, pt stable therefore doubt need for emergent intervention. Trop neg. CT head neg for acute findings. He was not yet given his GI cocktail and tylenol because he was sleeping, states his pain is the same, will give these now. Will also discuss case with social worker to see about shelter options.    7:51 PM Social worker in to see pt, gave resources and set up case management appt for Monday. Pt feeling improved after GI cocktail and tylenol. Will d/c with instructions to f/up with PCP in 5 days to check on his anemia and chronic chest pain. Doubt ACS or need for admission. I explained the diagnosis and have given explicit precautions to return to the ER including for any other new or worsening symptoms. The patient understands and accepts the medical plan as it's been dictated and I have answered their questions. Discharge instructions concerning home care and prescriptions have been given. The patient is STABLE and is discharged to home in good condition.  BP 117/42 mmHg  Pulse 72  Temp(Src) 98.2 F (36.8 C) (Oral)  Resp 23  Ht 5\' 9"  (1.753 m)  Wt 80 lb (36.288 kg)  BMI 11.81 kg/m2  SpO2 97%  Meds ordered this encounter  Medications  . acetaminophen (TYLENOL) tablet 650 mg    Sig:   . gi cocktail (Maalox,Lidocaine,Donnatal)    Sig:      Mercedes Camprubi-Soms, PA-C 03/03/15 1952  Daleen Bo, MD 03/03/15 2326

## 2015-03-03 NOTE — ED Notes (Signed)
Meal tray ordered-reg.diet 1925

## 2015-03-03 NOTE — Care Management (Signed)
ED CM consulted by M. Millbrook PA-C concerning discharge. Patient presented to [facility] ED with c/o CP and fall.  ED evaluation noted no acute finding, cleared for discharge. Patient is know to ED CM he has had 28 ED visits and 6 hospitalization, patient last residence [facility]. Contacted [address] regarding patient  returning  to facility, and was told that patient is no longer a resident and is now at the shelter in [place].  CM met with patient, he confirmed information.  Patient states, he is homeless, patient given resources for the homeless shelter, and food pantries in the local area. Patient also instructed to go to [facility] Monday morning to speak with a CM regarding homelessness assistance, he verbalizes understanding and teach back done.  CM spoke with CSW regarding clean clothing for patient. Patient was provided with bus pass. Updated EDP and Katie RN on Pod A, both are agreeable with disposition plan. ED CM will contact [facility] concerning follow up with patient. No further ED CM needs identified.

## 2015-03-03 NOTE — Discharge Instructions (Signed)
Continue to stay well-hydrated. Continue to alternate between Tylenol and Ibuprofen for pain or fever. Use Mucinex for cough suppression/expectoration of mucus. May consider over-the-counter Benadryl or other antihistamine to decrease secretions and for watery itchy eyes. Followup with your primary care doctor in 5-7 days for recheck of ongoing symptoms. Stop smoking! Use the resource guide you were given to find a homeless shelter, and follow up with the case worker as directed. Return to emergency department for emergent changing or worsening of symptoms.   Chest Pain (Nonspecific) It is often hard to give a specific diagnosis for the cause of chest pain. There is always a chance that your pain could be related to something serious, such as a heart attack or a blood clot in the lungs. You need to follow up with your health care provider for further evaluation. CAUSES   Heartburn.  Pneumonia or bronchitis.  Anxiety or stress.  Inflammation around your heart (pericarditis) or lung (pleuritis or pleurisy).  A blood clot in the lung.  A collapsed lung (pneumothorax). It can develop suddenly on its own (spontaneous pneumothorax) or from trauma to the chest.  Shingles infection (herpes zoster virus). The chest wall is composed of bones, muscles, and cartilage. Any of these can be the source of the pain.  The bones can be bruised by injury.  The muscles or cartilage can be strained by coughing or overwork.  The cartilage can be affected by inflammation and become sore (costochondritis). DIAGNOSIS  Lab tests or other studies may be needed to find the cause of your pain. Your health care provider may have you take a test called an ambulatory electrocardiogram (ECG). An ECG records your heartbeat patterns over a 24-hour period. You may also have other tests, such as:  Transthoracic echocardiogram (TTE). During echocardiography, sound waves are used to evaluate how blood flows through your  heart.  Transesophageal echocardiogram (TEE).  Cardiac monitoring. This allows your health care provider to monitor your heart rate and rhythm in real time.  Holter monitor. This is a portable device that records your heartbeat and can help diagnose heart arrhythmias. It allows your health care provider to track your heart activity for several days, if needed.  Stress tests by exercise or by giving medicine that makes the heart beat faster. TREATMENT   Treatment depends on what may be causing your chest pain. Treatment may include:  Acid blockers for heartburn.  Anti-inflammatory medicine.  Pain medicine for inflammatory conditions.  Antibiotics if an infection is present.  You may be advised to change lifestyle habits. This includes stopping smoking and avoiding alcohol, caffeine, and chocolate.  You may be advised to keep your head raised (elevated) when sleeping. This reduces the chance of acid going backward from your stomach into your esophagus. Most of the time, nonspecific chest pain will improve within 2-3 days with rest and mild pain medicine.  HOME CARE INSTRUCTIONS   If antibiotics were prescribed, take them as directed. Finish them even if you start to feel better.  For the next few days, avoid physical activities that bring on chest pain. Continue physical activities as directed.  Do not use any tobacco products, including cigarettes, chewing tobacco, or electronic cigarettes.  Avoid drinking alcohol.  Only take medicine as directed by your health care provider.  Follow your health care provider's suggestions for further testing if your chest pain does not go away.  Keep any follow-up appointments you made. If you do not go to an appointment, you could  develop lasting (chronic) problems with pain. If there is any problem keeping an appointment, call to reschedule. SEEK MEDICAL CARE IF:   Your chest pain does not go away, even after treatment.  You have a rash  with blisters on your chest.  You have a fever. SEEK IMMEDIATE MEDICAL CARE IF:   You have increased chest pain or pain that spreads to your arm, neck, jaw, back, or abdomen.  You have shortness of breath.  You have an increasing cough, or you cough up blood.  You have severe back or abdominal pain.  You feel nauseous or vomit.  You have severe weakness.  You faint.  You have chills. This is an emergency. Do not wait to see if the pain will go away. Get medical help at once. Call your local emergency services (911 in [place] Do not drive yourself to the hospital. MAKE SURE YOU:   Understand these instructions.  Will watch your condition.  Will get help right away if you are not doing well or get worse. Document Released: 03/06/2005 Document Revised: 06/01/2013 Document Reviewed: 12/31/2007 [facility] Patient Information 2015 [place], [place]. This information is not intended to replace advice given to you by your health care provider. Make sure you discuss any questions you have with your health care provider.  Cough, Adult  A cough is a reflex that helps clear your throat and airways. It can help heal the body or may be a reaction to an irritated airway. A cough may only last 2 or 3 weeks (acute) or may last more than 8 weeks (chronic).  CAUSES Acute cough:  Viral or bacterial infections. Chronic cough:  Infections.  Allergies.  Asthma.  Post-nasal drip.  Smoking.  Heartburn or acid reflux.  Some medicines.  Chronic lung problems (COPD).  Cancer. SYMPTOMS   Cough.  Fever.  Chest pain.  Increased breathing rate.  High-pitched whistling sound when breathing (wheezing).  Colored mucus that you cough up (sputum). TREATMENT   A bacterial cough may be treated with antibiotic medicine.  A viral cough must run its course and will not respond to antibiotics.  Your caregiver may recommend other treatments if you have a chronic cough. HOME CARE  INSTRUCTIONS   Only take over-the-counter or prescription medicines for pain, discomfort, or fever as directed by your caregiver. Use cough suppressants only as directed by your caregiver.  Use a cold steam vaporizer or humidifier in your bedroom or home to help loosen secretions.  Sleep in a semi-upright position if your cough is worse at night.  Rest as needed.  Stop smoking if you smoke. SEEK IMMEDIATE MEDICAL CARE IF:   You have pus in your sputum.  Your cough starts to worsen.  You cannot control your cough with suppressants and are losing sleep.  You begin coughing up blood.  You have difficulty breathing.  You develop pain which is getting worse or is uncontrolled with medicine.  You have a fever. MAKE SURE YOU:   Understand these instructions.  Will watch your condition.  Will get help right away if you are not doing well or get worse. Document Released: 11/23/2010 Document Revised: 08/19/2011 Document Reviewed: 11/23/2010 [facility] Patient Information 2015 [place], [place]. This information is not intended to replace advice given to you by your health care provider. Make sure you discuss any questions you have with your health care provider.  Fall Prevention and Home Safety Falls cause injuries and can affect all age groups. It is possible to prevent falls.  HOW TO PREVENT FALLS  Wear shoes with rubber soles that do not have an opening for your toes.  Keep the inside and outside of your house well lit.  Use night lights throughout your home.  Remove clutter from floors.  Clean up floor spills.  Remove throw rugs or fasten them to the floor with carpet tape.  Do not place electrical cords across pathways.  Put grab bars by your tub, shower, and toilet. Do not use towel bars as grab bars.  Put handrails on both sides of the stairway. Fix loose handrails.  Do not climb on stools or stepladders, if possible.  Do not wax your floors.  Repair uneven or  unsafe sidewalks, walkways, or stairs.  Keep items you use a lot within reach.  Be aware of pets.  Keep emergency numbers next to the telephone.  Put smoke detectors in your home and near bedrooms. Ask your doctor what other things you can do to prevent falls. Document Released: 03/23/2009 Document Revised: 11/26/2011 Document Reviewed: 08/27/2011 [facility] Patient Information 2015 [place], [place]. This information is not intended to replace advice given to you by your health care provider. Make sure you discuss any questions you have with your health care provider.  Iron Deficiency Anemia Anemia is when you have a low number of healthy red blood cells. It is often caused by too little iron. This is called iron deficiency anemia. It may make you tired and short of breath. HOME CARE   Take iron as told by your doctor.  Take vitamins as told by your doctor.  Eat foods that have iron in them. This includes liver, lean beef, whole-grain bread, eggs, dried fruit, and dark green leafy vegetables. GET HELP RIGHT AWAY IF:  You pass out (faint).  You have chest pain.  You feel sick to your stomach (nauseous) or throw up (vomit).  You get very short of breath with activity.  You are weak.  You have a fast heartbeat.  You start to sweat for no reason.  You become light-headed when getting up from a chair or bed. MAKE SURE YOU:  Understand these instructions.  Will watch your condition.  Will get help right away if you are not doing well or get worse. Document Released: 06/29/2010 Document Revised: 06/01/2013 Document Reviewed: 02/01/2013 [facility] Patient Information 2015 [place], [place]. This information is not intended to replace advice given to you by your health care provider. Make sure you discuss any questions you have with your health care provider.  Smoking Cessation, Tips for Success If you are ready to quit smoking, congratulations! You have chosen to help yourself be  healthier. Cigarettes bring nicotine, tar, carbon monoxide, and other irritants into your body. Your lungs, heart, and blood vessels will be able to work better without these poisons. There are many different ways to quit smoking. Nicotine gum, nicotine patches, a nicotine inhaler, or nicotine nasal spray can help with physical craving. Hypnosis, support groups, and medicines help break the habit of smoking. WHAT THINGS CAN I DO TO MAKE QUITTING EASIER?  Here are some tips to help you quit for good:  Pick a date when you will quit smoking completely. Tell all of your friends and family about your plan to quit on that date.  Do not try to slowly cut down on the number of cigarettes you are smoking. Pick a quit date and quit smoking completely starting on that day.  Throw away all cigarettes.   Clean and remove all  ashtrays from your home, work, and car.  On a card, write down your reasons for quitting. Carry the card with you and read it when you get the urge to smoke.  Cleanse your body of nicotine. Drink enough water and fluids to keep your urine clear or pale yellow. Do this after quitting to flush the nicotine from your body.  Learn to predict your moods. Do not let a bad situation be your excuse to have a cigarette. Some situations in your life might tempt you into wanting a cigarette.  Never have "just one" cigarette. It leads to wanting another and another. Remind yourself of your decision to quit.  Change habits associated with smoking. If you smoked while driving or when feeling stressed, try other activities to replace smoking. Stand up when drinking your coffee. Brush your teeth after eating. Sit in a different chair when you read the paper. Avoid alcohol while trying to quit, and try to drink fewer caffeinated beverages. Alcohol and caffeine may urge you to smoke.  Avoid foods and drinks that can trigger a desire to smoke, such as sugary or spicy foods and alcohol.  Ask people who  smoke not to smoke around you.  Have something planned to do right after eating or having a cup of coffee. For example, plan to take a walk or exercise.  Try a relaxation exercise to calm you down and decrease your stress. Remember, you may be tense and nervous for the first 2 weeks after you quit, but this will pass.  Find new activities to keep your hands busy. Play with a pen, coin, or rubber band. Doodle or draw things on paper.  Brush your teeth right after eating. This will help cut down on the craving for the taste of tobacco after meals. You can also try mouthwash.   Use oral substitutes in place of cigarettes. Try using lemon drops, carrots, cinnamon sticks, or chewing gum. Keep them handy so they are available when you have the urge to smoke.  When you have the urge to smoke, try deep breathing.  Designate your home as a nonsmoking area.  If you are a heavy smoker, ask your health care provider about a prescription for nicotine chewing gum. It can ease your withdrawal from nicotine.  Reward yourself. Set aside the cigarette money you save and buy yourself something nice.  Look for support from others. Join a support group or smoking cessation program. Ask someone at home or at work to help you with your plan to quit smoking.  Always ask yourself, "Do I need this cigarette or is this just a reflex?" Tell yourself, "Today, I choose not to smoke," or "I do not want to smoke." You are reminding yourself of your decision to quit.  Do not replace cigarette smoking with electronic cigarettes (commonly called e-cigarettes). The safety of e-cigarettes is unknown, and some may contain harmful chemicals.  If you relapse, do not give up! Plan ahead and think about what you will do the next time you get the urge to smoke. HOW WILL I FEEL WHEN I QUIT SMOKING? You may have symptoms of withdrawal because your body is used to nicotine (the addictive substance in cigarettes). You may crave  cigarettes, be irritable, feel very hungry, cough often, get headaches, or have difficulty concentrating. The withdrawal symptoms are only temporary. They are strongest when you first quit but will go away within 10-14 days. When withdrawal symptoms occur, stay in control. Think about your reasons  for quitting. Remind yourself that these are signs that your body is healing and getting used to being without cigarettes. Remember that withdrawal symptoms are easier to treat than the major diseases that smoking can cause.  Even after the withdrawal is over, expect periodic urges to smoke. However, these cravings are generally short lived and will go away whether you smoke or not. Do not smoke! WHAT RESOURCES ARE AVAILABLE TO HELP ME QUIT SMOKING? Your health care provider can direct you to community resources or hospitals for support, which may include:  Group support.  Education.  Hypnosis.  Therapy. Document Released: 02/23/2004 Document Revised: 10/11/2013 Document Reviewed: 11/12/2012 [facility] Patient Information 2015 [place], [place]. This information is not intended to replace advice given to you by your health care provider. Make sure you discuss any questions you have with your health care provider.

## 2015-03-03 NOTE — Progress Notes (Signed)
Clean clothes provided to pt

## 2015-03-03 NOTE — ED Notes (Signed)
Per EMS: cp x 2 hours.  Productive cough, had flu shot yesterday. [place]400 [place] and has several abrasions all over body in various stages of healing.  Homeless currently.

## 2015-03-04 ENCOUNTER — Encounter (HOSPITAL_COMMUNITY): Payer: Self-pay | Admitting: Emergency Medicine

## 2015-03-04 ENCOUNTER — Emergency Department (HOSPITAL_COMMUNITY)
Admission: EM | Admit: 2015-03-04 | Discharge: 2015-03-04 | Discharge status: Against Medical Advice | Payer: Medicare Other | Attending: Emergency Medicine | Admitting: Emergency Medicine

## 2015-03-04 DIAGNOSIS — Z720 Tobacco use: Secondary | ICD-10-CM | POA: Insufficient documentation

## 2015-03-04 DIAGNOSIS — R109 Unspecified abdominal pain: Secondary | ICD-10-CM | POA: Insufficient documentation

## 2015-03-04 DIAGNOSIS — K625 Hemorrhage of anus and rectum: Secondary | ICD-10-CM | POA: Diagnosis not present

## 2015-03-04 DIAGNOSIS — I10 Essential (primary) hypertension: Secondary | ICD-10-CM | POA: Insufficient documentation

## 2015-03-04 LAB — COMPREHENSIVE METABOLIC PANEL
ALBUMIN: 3.9 g/dL (ref 3.5–5.0)
ALK PHOS: 95 U/L (ref 38–126)
ALT: 15 U/L — AB (ref 17–63)
ANION GAP: 7 (ref 5–15)
AST: 22 U/L (ref 15–41)
BILIRUBIN TOTAL: 0.6 mg/dL (ref 0.3–1.2)
BUN: 24 mg/dL — ABNORMAL HIGH (ref 6–20)
CALCIUM: 9 mg/dL (ref 8.9–10.3)
CO2: 26 mmol/L (ref 22–32)
CREATININE: 0.79 mg/dL (ref 0.61–1.24)
Chloride: 109 mmol/L (ref 101–111)
GFR calc Af Amer: >60 mL/min (ref >60)
GFR calc non Af Amer: >60 mL/min (ref >60)
GLUCOSE: 135 mg/dL — AB (ref 65–99)
Potassium: 3.9 mmol/L (ref 3.5–5.1)
SODIUM: 142 mmol/L (ref 135–145)
TOTAL PROTEIN: 7.6 g/dL (ref 6.5–8.1)

## 2015-03-04 LAB — CBC
HCT: 32.2 % — ABNORMAL LOW (ref 39.0–52.0)
HEMOGLOBIN: 10.2 g/dL — AB (ref 13.0–17.0)
MCH: 30.1 pg (ref 26.0–34.0)
MCHC: 31.7 g/dL (ref 30.0–36.0)
MCV: 95 fL (ref 78.0–100.0)
PLATELETS: 166 10*3/uL (ref 150–400)
RBC: 3.39 MIL/uL — ABNORMAL LOW (ref 4.22–5.81)
RDW: 15.5 % (ref 11.5–15.5)
WBC: 6.5 10*3/uL (ref 4.0–10.5)

## 2015-03-04 LAB — TYPE AND SCREEN
ABO/RH(D): O POS
ANTIBODY SCREEN: NEGATIVE

## 2015-03-04 NOTE — ED Notes (Signed)
Pt called x2 to be roomed with no response. RN notified.

## 2015-03-04 NOTE — ED Notes (Signed)
Called--not in [facility]

## 2015-03-04 NOTE — ED Notes (Signed)
Called pt for blood draw, no answer.

## 2015-03-04 NOTE — ED Notes (Signed)
[place] and bloody stool today. Pain is more RLQ and pt normally has bloody stool. Pt denies vomiting.  Pt in afib per EMS. 122/62, 79 irregular, r18.

## 2015-03-05 ENCOUNTER — Emergency Department (HOSPITAL_COMMUNITY)
Admission: EM | Admit: 2015-03-05 | Discharge: 2015-03-05 | Disposition: A | Discharge status: Home / Self Care | Payer: Medicare Other | Attending: Emergency Medicine | Admitting: Emergency Medicine

## 2015-03-05 ENCOUNTER — Emergency Department (HOSPITAL_COMMUNITY): Payer: Medicare Other

## 2015-03-05 ENCOUNTER — Encounter (HOSPITAL_COMMUNITY): Payer: Self-pay | Admitting: *Deleted

## 2015-03-05 DIAGNOSIS — Z8739 Personal history of other diseases of the musculoskeletal system and connective tissue: Secondary | ICD-10-CM | POA: Insufficient documentation

## 2015-03-05 DIAGNOSIS — I10 Essential (primary) hypertension: Secondary | ICD-10-CM | POA: Insufficient documentation

## 2015-03-05 DIAGNOSIS — Z720 Tobacco use: Secondary | ICD-10-CM | POA: Diagnosis not present

## 2015-03-05 DIAGNOSIS — Z86018 Personal history of other benign neoplasm: Secondary | ICD-10-CM | POA: Diagnosis not present

## 2015-03-05 DIAGNOSIS — Z8639 Personal history of other endocrine, nutritional and metabolic disease: Secondary | ICD-10-CM | POA: Diagnosis not present

## 2015-03-05 DIAGNOSIS — Z8572 Personal history of non-Hodgkin lymphomas: Secondary | ICD-10-CM | POA: Diagnosis not present

## 2015-03-05 DIAGNOSIS — R0789 Other chest pain: Secondary | ICD-10-CM | POA: Insufficient documentation

## 2015-03-05 DIAGNOSIS — Z950 Presence of cardiac pacemaker: Secondary | ICD-10-CM | POA: Insufficient documentation

## 2015-03-05 DIAGNOSIS — Z8659 Personal history of other mental and behavioral disorders: Secondary | ICD-10-CM | POA: Diagnosis not present

## 2015-03-05 DIAGNOSIS — R05 Cough: Secondary | ICD-10-CM | POA: Diagnosis present

## 2015-03-05 DIAGNOSIS — Z79899 Other long term (current) drug therapy: Secondary | ICD-10-CM | POA: Insufficient documentation

## 2015-03-05 DIAGNOSIS — Z8719 Personal history of other diseases of the digestive system: Secondary | ICD-10-CM | POA: Diagnosis not present

## 2015-03-05 DIAGNOSIS — N400 Enlarged prostate without lower urinary tract symptoms: Secondary | ICD-10-CM | POA: Insufficient documentation

## 2015-03-05 DIAGNOSIS — I498 Other specified cardiac arrhythmias: Secondary | ICD-10-CM | POA: Insufficient documentation

## 2015-03-05 DIAGNOSIS — I4891 Unspecified atrial fibrillation: Secondary | ICD-10-CM | POA: Insufficient documentation

## 2015-03-05 DIAGNOSIS — Z7901 Long term (current) use of anticoagulants: Secondary | ICD-10-CM | POA: Diagnosis not present

## 2015-03-05 LAB — I-STAT TROPONIN, ED
TROPONIN I, POC: 0 ng/mL (ref 0.00–0.08)
Troponin i, poc: 0 ng/mL (ref 0.00–0.08)

## 2015-03-05 LAB — BASIC METABOLIC PANEL
Anion gap: 7 (ref 5–15)
BUN: 17 mg/dL (ref 6–20)
CALCIUM: 9.3 mg/dL (ref 8.9–10.3)
CHLORIDE: 105 mmol/L (ref 101–111)
CO2: 24 mmol/L (ref 22–32)
CREATININE: 0.66 mg/dL (ref 0.61–1.24)
GFR calc Af Amer: >60 mL/min (ref >60)
GFR calc non Af Amer: >60 mL/min (ref >60)
Glucose, Bld: 123 mg/dL — ABNORMAL HIGH (ref 65–99)
Potassium: 4 mmol/L (ref 3.5–5.1)
Sodium: 136 mmol/L (ref 135–145)

## 2015-03-05 LAB — CBC
HCT: 38.1 % — ABNORMAL LOW (ref 39.0–52.0)
Hemoglobin: 12.3 g/dL — ABNORMAL LOW (ref 13.0–17.0)
MCH: 30.7 pg (ref 26.0–34.0)
MCHC: 32.3 g/dL (ref 30.0–36.0)
MCV: 95 fL (ref 78.0–100.0)
PLATELETS: 151 10*3/uL (ref 150–400)
RBC: 4.01 MIL/uL — ABNORMAL LOW (ref 4.22–5.81)
RDW: 15.4 % (ref 11.5–15.5)
WBC: 5.4 10*3/uL (ref 4.0–10.5)

## 2015-03-05 NOTE — ED Notes (Signed)
EMT saw pt in room taking many Kurlex and socks and put it into his trashbag, RN asked pt about it, he stated he wanted to wrap his arm, pt put multiple kurlex back with this RN when asked, arm wrapped in [place] per pt request.

## 2015-03-05 NOTE — ED Provider Notes (Signed)
CSN: [id number]     Arrival date & time 03/05/15  1014 History   First MD Initiated Contact with Patient 03/05/15 1015     Chief Complaint  Patient presents with  . Cough     (Consider location/radiation/quality/duration/timing/severity/associated sxs/prior Treatment) HPI [name] is a 69 y.o. male with a history of chronic A. fib not on anticoagulation, hypertension, history of cardiac pacemaker, comes in for evaluation of cough and chest pain. Patient has been seen in this ED multiple times in the past for same symptoms with a benign workup, most recently 2 days ago. Presents today with same sharp, substernal chest discomfort, onset at 9 AM. Coughing exacerbates his symptoms. Denies fevers, chills, shortness of breath, leg swelling, recent travel or surgeries. Has not tried anything to improve his symptoms. Rates his discomfort as 8/10. Denies any other illicit drug use, but does smoke cigarettes. No other aggravating or modifying factors.  Past Medical History  Diagnosis Date  . Depression   . Hypomagnesemia   . Fall at nursing home 05/27/2013    "slipped on water; said I broke my right hip" (05/27/2013)  . Migraine     "used to have them bad; haven't had one in awhile" (05/27/2013)  . Gout attack     "not too long ago; in my right foot" (05/27/2013)  . Complete heart block     reason for pacemaker/notes 05/27/2013  . Atrial fibrillation     [name] 05/27/2013  . HLD (hyperlipidemia)   . GIB (gastrointestinal bleeding)   . BPH (benign prostatic hyperplasia)   . Presence of permanent cardiac pacemaker   . Hypertension   . Gastric ulcer   . Non Hodgkin's lymphoma   . Tubular adenoma of colon    Past Surgical History  Procedure Laterality Date  . Insert / replace / remove pacemaker    . Appendectomy    . Cholecystectomy    . Open reduction of hip Right 05/28/2013    Procedure: OPEN REDUCTION OF HIP;  Surgeon: Renette Butters, MD;  Location: [place];  Service: Orthopedics;   Laterality: Right;  . Esophagogastroduodenoscopy (egd) with propofol N/A 09/26/2014    Procedure: ESOPHAGOGASTRODUODENOSCOPY (EGD) WITH PROPOFOL;  Surgeon: Jerene Bears, MD;  [facility] ENDOSCOPY;  Service: Endoscopy;  Laterality: N/A;  . Colonoscopy N/A 10/01/2014    Procedure: COLONOSCOPY;  Surgeon: Jerene Bears, MD;  Location: [facility] ENDOSCOPY;  Service: Endoscopy;  Laterality: N/A;  . Partial colectomy N/A 10/05/2014    Procedure: SUBTOTAL COLECTOMY WITH ILEORECTAL ANASTOMOSIS;  Surgeon: Donnie Mesa, MD;  Location: [place];  Service: General;  Laterality: N/A;  . Flexible sigmoidoscopy N/A 11/07/2014    Procedure: FLEXIBLE SIGMOIDOSCOPY;  Surgeon: Jerene Bears, MD;  Location: [facility] ENDOSCOPY;  Service: Endoscopy;  Laterality: N/A;  . Gastric resection     Family History  Problem Relation Age of Onset  . Colon cancer Mother    Social History  Substance Use Topics  . Smoking status: Current Some Day Smoker -- 0.50 packs/day for 66 years    Types: Cigarettes  . Smokeless tobacco: Former Systems developer    Types: Snuff, Chew     Comment: 05/27/2013 "haven't used chew or snuff in ~ 30 yr"  . Alcohol Use: No     Comment: 05/27/2013 "none in years; never had problem w/it" - not in 30 years    Review of Systems A 10 point review of systems was completed and was negative except for pertinent positives and negatives  as mentioned in the history of present illness     Allergies  Review of patient's allergies indicates no known allergies.  Home Medications   Prior to Admission medications   Medication Sig Start Date End Date Taking? Authorizing Provider  carvedilol (COREG) 6.25 MG tablet Take 1 tablet (6.25 mg total) by mouth 2 (two) times daily with a meal. 11/16/14   Velvet Bathe, MD  finasteride (PROSCAR) 5 MG tablet Take 5 mg by mouth daily.    Historical Provider, MD  warfarin (COUMADIN) 5 MG tablet Take 5 mg by mouth daily. At [place] Provider, MD   BP 119/58 mmHg  Pulse 92  Temp(Src) 98  F (36.7 C) (Oral)  Resp 16  Ht 5\' 9"  (1.753 m)  Wt 80 lb (36.288 kg)  BMI 11.81 kg/m2  SpO2 98% Physical Exam  Constitutional: He is oriented to person, place, and time. He appears well-developed and well-nourished.  HENT:  Head: Normocephalic and atraumatic.  Mouth/Throat: Oropharynx is clear and moist.  Eyes: Conjunctivae are normal. Pupils are equal, round, and reactive to light. Right eye exhibits no discharge. Left eye exhibits no discharge. No scleral icterus.  Neck: Normal range of motion. Neck supple. No JVD present.  Cardiovascular: Normal heart sounds.   Irregularly irregular rhythm with normal heart sounds.  Pulmonary/Chest: Effort normal and breath sounds normal. No respiratory distress. He has no wheezes. He has no rales.  Abdominal: Soft. There is no tenderness.  Musculoskeletal: He exhibits no tenderness.  No leg swelling, erythema or tenderness.  Neurological: He is alert and oriented to person, place, and time.  Cranial Nerves II-XII grossly intact  Skin: Skin is warm and dry. No rash noted.  Psychiatric: He has a normal mood and affect.  Nursing note and vitals reviewed.   ED Course  Procedures (including critical care time) Labs Review Labs Reviewed  BASIC METABOLIC PANEL - Abnormal; Notable for the following:    Glucose, Bld 123 (*)    All other components within normal limits  CBC - Abnormal; Notable for the following:    RBC 4.01 (*)    Hemoglobin 12.3 (*)    HCT 38.1 (*)    All other components within normal limits  I-STAT TROPOININ, ED  Randolm Idol, ED    Imaging Review Dg Chest 2 View  03/05/2015   CLINICAL DATA:  69 year old male with left-sided chest pain since this morning. No associated shortness of breath. Productive cough with green phlegm production for 1 week.  EXAM: CHEST  2 VIEW  COMPARISON:  Chest x-ray 03/03/2015.  FINDINGS: Lung volumes are normal. No consolidative airspace disease. No pleural effusions. No pneumothorax. No  pulmonary nodule or mass noted. Pulmonary vasculature and the cardiomediastinal silhouette are within normal limits. Atherosclerosis in the thoracic aorta. Right-sided pacemaker in position with lead tips projecting over the expected location of the right atrium and right ventricular apex.  IMPRESSION: 1. No radiographic evidence of acute cardiopulmonary disease. 2. Atherosclerosis.   Electronically Signed   By: Vinnie Langton M.D.   On: 03/05/2015 11:21   Dg Chest 2 View  03/03/2015   CLINICAL DATA:  Chest pain.  EXAM: CHEST  2 VIEW  COMPARISON:  February 27, 2015.  FINDINGS: The heart size and mediastinal contours are within normal limits. Both lungs are clear. No pneumothorax or pleural effusion is noted. Right-sided pacemaker is unchanged in position. Hyperexpansion of the lungs is noted consistent with chronic obstructive pulmonary disease. The visualized skeletal structures  are unremarkable.  IMPRESSION: Findings consistent with chronic obstructive pulmonary disease. No acute cardiopulmonary abnormality seen.   Electronically Signed   By: Marijo Conception, M.D.   On: 03/03/2015 14:56   Ct Head Wo Contrast  03/03/2015   CLINICAL DATA:  Chest pain for the past 2 hr. Productive cough. [place] with multiple abrasions.  EXAM: CT HEAD WITHOUT CONTRAST  TECHNIQUE: Contiguous axial images were obtained from the base of the skull through the vertex without intravenous contrast.  COMPARISON:  11/06/2014.  FINDINGS: Diffusely enlarged ventricles and subarachnoid spaces. Patchy white matter low density in both cerebral hemispheres. No skull fracture, intracranial hemorrhage, mass lesion, CT evidence of acute infarction or paranasal sinus air-fluid levels. Minimal bilateral anterior ethmoid sinus mucosal thickening.  IMPRESSION: 1. No acute abnormality. 2. Stable mild diffuse cerebral and cerebellar atrophy and minimal chronic small vessel white matter ischemic changes in both cerebral  hemispheres. 3. Minimal chronic bilateral ethmoid sinusitis.   Electronically Signed   By: Claudie Revering M.D.   On: 03/03/2015 18:09   I have personally reviewed and evaluated these images and lab results as part of my medical decision-making.   EKG Interpretation   Date/Time:  Sunday March 05 2015 10:23:02 EDT Ventricular Rate:  87 PR Interval:    QRS Duration: 104 QT Interval:  384 QTC Calculation: 462 R Axis:   -138 Text Interpretation:  Afib/flut and V-paced complexes Anterior infarct,  old No significant change since last tracing Confirmed by [facility] MD,  Levasy ([id number]) on 03/05/2015 10:43:40 AM     Meds given in ED:  Medications - No data to display  Discharge Medication List as of 03/05/2015  2:15 PM     Filed Vitals:   03/05/15 1022 03/05/15 1045 03/05/15 1100 03/05/15 1350  BP:  134/83 120/71 119/58  Pulse:  87 90 92  Temp: 98 F (36.7 C)     TempSrc: Oral     Resp:  24 16   Height:      Weight:      SpO2:  100% 97% 98%    MDM  Vitals stable - WNL -afebrile, 100% on room air Pt resting comfortably in ED. Denies any chest pain whatsoever. Symptoms resolved without any treatment in the ED. PE--benign cardiopulmonary exams. No unilateral leg swelling, erythema, warmth. No palpable cords. Patient has chronic A. fib, not on anticoagulation. Labwork--labs are grossly unremarkable and baseline for patient. Delta troponin negative. EKG is reassuring. Imaging-chest x-ray shows no acute cardiopulmonary pathology.  DDX--patient presents with atypical chest pain. Patient has been seen in the ED multiple times for this problem and has had a benign workup. No new medical problems or objective findings today. At this time, I do not feel current presentation is related to ACS. Low suspicion for PE or other acute cardiopulmonary pathology. Discussed with patient he has been seen multiple times for this chest pain and would he consider outpatient cardiology workup. Patient  states "No, I would not, I want my discharge papers so I can go home".  I discussed all relevant lab findings and imaging results with pt and they verbalized understanding. Discussed f/u with PCP within 48 hrs and return precautions, pt very amenable to plan. Prior to patient discharge, I discussed and reviewed this case with Dr. Billy Fischer, who also saw by with patient and agrees with above plan.  Final diagnoses:  Atypical chest pain        Comer Locket, PA-C 03/05/15 1448  Gareth Morgan, MD 03/07/15 [phone]

## 2015-03-05 NOTE — ED Notes (Signed)
Pt left AMA with a bag full of supplies from the room. Darci Current, RN and [employer] informed.

## 2015-03-05 NOTE — ED Notes (Signed)
Pt arrives via [facility]. Pt states he has had a cough x1 week. Pt endorses rib pain and substernal cp with coughing. Pt in NAD upon arrival.

## 2015-03-05 NOTE — Discharge Instructions (Signed)
There does not appear to be an emergent cause for your symptoms at this time. Please follow-up with your doctor and cardiology for further evaluation and management of your symptoms. Return to ED for any worsening symptoms.  Chest Pain (Nonspecific) It is often hard to give a specific diagnosis for the cause of chest pain. There is always a chance that your pain could be related to something serious, such as a heart attack or a blood clot in the lungs. You need to follow up with your health care provider for further evaluation. CAUSES   Heartburn.  Pneumonia or bronchitis.  Anxiety or stress.  Inflammation around your heart (pericarditis) or lung (pleuritis or pleurisy).  A blood clot in the lung.  A collapsed lung (pneumothorax). It can develop suddenly on its own (spontaneous pneumothorax) or from trauma to the chest.  Shingles infection (herpes zoster virus). The chest wall is composed of bones, muscles, and cartilage. Any of these can be the source of the pain.  The bones can be bruised by injury.  The muscles or cartilage can be strained by coughing or overwork.  The cartilage can be affected by inflammation and become sore (costochondritis). DIAGNOSIS  Lab tests or other studies may be needed to find the cause of your pain. Your health care provider may have you take a test called an ambulatory electrocardiogram (ECG). An ECG records your heartbeat patterns over a 24-hour period. You may also have other tests, such as:  Transthoracic echocardiogram (TTE). During echocardiography, sound waves are used to evaluate how blood flows through your heart.  Transesophageal echocardiogram (TEE).  Cardiac monitoring. This allows your health care provider to monitor your heart rate and rhythm in real time.  Holter monitor. This is a portable device that records your heartbeat and can help diagnose heart arrhythmias. It allows your health care provider to track your heart activity for  several days, if needed.  Stress tests by exercise or by giving medicine that makes the heart beat faster. TREATMENT   Treatment depends on what may be causing your chest pain. Treatment may include:  Acid blockers for heartburn.  Anti-inflammatory medicine.  Pain medicine for inflammatory conditions.  Antibiotics if an infection is present.  You may be advised to change lifestyle habits. This includes stopping smoking and avoiding alcohol, caffeine, and chocolate.  You may be advised to keep your head raised (elevated) when sleeping. This reduces the chance of acid going backward from your stomach into your esophagus. Most of the time, nonspecific chest pain will improve within 2-3 days with rest and mild pain medicine.  HOME CARE INSTRUCTIONS   If antibiotics were prescribed, take them as directed. Finish them even if you start to feel better.  For the next few days, avoid physical activities that bring on chest pain. Continue physical activities as directed.  Do not use any tobacco products, including cigarettes, chewing tobacco, or electronic cigarettes.  Avoid drinking alcohol.  Only take medicine as directed by your health care provider.  Follow your health care provider's suggestions for further testing if your chest pain does not go away.  Keep any follow-up appointments you made. If you do not go to an appointment, you could develop lasting (chronic) problems with pain. If there is any problem keeping an appointment, call to reschedule. SEEK MEDICAL CARE IF:   Your chest pain does not go away, even after treatment.  You have a rash with blisters on your chest.  You have a fever. SEEK  IMMEDIATE MEDICAL CARE IF:   You have increased chest pain or pain that spreads to your arm, neck, jaw, back, or abdomen.  You have shortness of breath.  You have an increasing cough, or you cough up blood.  You have severe back or abdominal pain.  You feel nauseous or  vomit.  You have severe weakness.  You faint.  You have chills. This is an emergency. Do not wait to see if the pain will go away. Get medical help at once. Call your local emergency services (911 in [place] Do not drive yourself to the hospital. MAKE SURE YOU:   Understand these instructions.  Will watch your condition.  Will get help right away if you are not doing well or get worse. Document Released: 03/06/2005 Document Revised: 06/01/2013 Document Reviewed: 12/31/2007 [facility] Patient Information 2015 [place], [place]. This information is not intended to replace advice given to you by your health care provider. Make sure you discuss any questions you have with your health care provider.

## 2015-03-20 ENCOUNTER — Ambulatory Visit: Payer: Self-pay | Admitting: Internal Medicine

## 2015-04-11 ENCOUNTER — Encounter (HOSPITAL_COMMUNITY): Payer: Self-pay | Admitting: *Deleted

## 2015-04-11 ENCOUNTER — Emergency Department (HOSPITAL_COMMUNITY)
Admission: EM | Admit: 2015-04-11 | Discharge: 2015-04-11 | Disposition: A | Discharge status: Home / Self Care | Payer: Medicare Other | Source: Home / Self Care | Attending: Emergency Medicine | Admitting: Emergency Medicine

## 2015-04-11 ENCOUNTER — Encounter (HOSPITAL_COMMUNITY): Payer: Self-pay | Admitting: Emergency Medicine

## 2015-04-11 ENCOUNTER — Emergency Department (HOSPITAL_COMMUNITY)
Admission: EM | Admit: 2015-04-11 | Discharge: 2015-04-12 | Disposition: A | Discharge status: Home / Self Care | Payer: Medicare Other | Source: Home / Self Care | Attending: Emergency Medicine | Admitting: Emergency Medicine

## 2015-04-11 ENCOUNTER — Emergency Department (HOSPITAL_COMMUNITY)
Admission: EM | Admit: 2015-04-11 | Discharge: 2015-04-11 | Disposition: A | Discharge status: Home / Self Care | Payer: Medicare Other | Attending: Emergency Medicine | Admitting: Emergency Medicine

## 2015-04-11 DIAGNOSIS — N400 Enlarged prostate without lower urinary tract symptoms: Secondary | ICD-10-CM | POA: Insufficient documentation

## 2015-04-11 DIAGNOSIS — Z8572 Personal history of non-Hodgkin lymphomas: Secondary | ICD-10-CM | POA: Diagnosis not present

## 2015-04-11 DIAGNOSIS — R062 Wheezing: Secondary | ICD-10-CM | POA: Insufficient documentation

## 2015-04-11 DIAGNOSIS — K921 Melena: Secondary | ICD-10-CM | POA: Diagnosis not present

## 2015-04-11 DIAGNOSIS — G8929 Other chronic pain: Secondary | ICD-10-CM

## 2015-04-11 DIAGNOSIS — I10 Essential (primary) hypertension: Secondary | ICD-10-CM | POA: Insufficient documentation

## 2015-04-11 DIAGNOSIS — R0789 Other chest pain: Secondary | ICD-10-CM | POA: Diagnosis not present

## 2015-04-11 DIAGNOSIS — Y92410 Unspecified street and highway as the place of occurrence of the external cause: Secondary | ICD-10-CM | POA: Insufficient documentation

## 2015-04-11 DIAGNOSIS — K6389 Other specified diseases of intestine: Secondary | ICD-10-CM | POA: Diagnosis not present

## 2015-04-11 DIAGNOSIS — Y9389 Activity, other specified: Secondary | ICD-10-CM | POA: Insufficient documentation

## 2015-04-11 DIAGNOSIS — R1084 Generalized abdominal pain: Secondary | ICD-10-CM | POA: Insufficient documentation

## 2015-04-11 DIAGNOSIS — Z7901 Long term (current) use of anticoagulants: Secondary | ICD-10-CM | POA: Diagnosis not present

## 2015-04-11 DIAGNOSIS — Z720 Tobacco use: Secondary | ICD-10-CM | POA: Insufficient documentation

## 2015-04-11 DIAGNOSIS — Z8639 Personal history of other endocrine, nutritional and metabolic disease: Secondary | ICD-10-CM | POA: Diagnosis not present

## 2015-04-11 DIAGNOSIS — Y998 Other external cause status: Secondary | ICD-10-CM | POA: Diagnosis not present

## 2015-04-11 DIAGNOSIS — W010XXA Fall on same level from slipping, tripping and stumbling without subsequent striking against object, initial encounter: Secondary | ICD-10-CM | POA: Insufficient documentation

## 2015-04-11 DIAGNOSIS — R0602 Shortness of breath: Secondary | ICD-10-CM | POA: Insufficient documentation

## 2015-04-11 DIAGNOSIS — I4891 Unspecified atrial fibrillation: Secondary | ICD-10-CM | POA: Insufficient documentation

## 2015-04-11 DIAGNOSIS — R109 Unspecified abdominal pain: Secondary | ICD-10-CM

## 2015-04-11 DIAGNOSIS — Z79899 Other long term (current) drug therapy: Secondary | ICD-10-CM | POA: Diagnosis not present

## 2015-04-11 DIAGNOSIS — R079 Chest pain, unspecified: Secondary | ICD-10-CM

## 2015-04-11 DIAGNOSIS — Z8739 Personal history of other diseases of the musculoskeletal system and connective tissue: Secondary | ICD-10-CM | POA: Insufficient documentation

## 2015-04-11 DIAGNOSIS — Z8659 Personal history of other mental and behavioral disorders: Secondary | ICD-10-CM | POA: Diagnosis not present

## 2015-04-11 DIAGNOSIS — Z950 Presence of cardiac pacemaker: Secondary | ICD-10-CM | POA: Insufficient documentation

## 2015-04-11 DIAGNOSIS — Z8719 Personal history of other diseases of the digestive system: Secondary | ICD-10-CM | POA: Diagnosis not present

## 2015-04-11 DIAGNOSIS — Z85038 Personal history of other malignant neoplasm of large intestine: Secondary | ICD-10-CM | POA: Diagnosis not present

## 2015-04-11 DIAGNOSIS — R197 Diarrhea, unspecified: Secondary | ICD-10-CM | POA: Insufficient documentation

## 2015-04-11 DIAGNOSIS — S79912A Unspecified injury of left hip, initial encounter: Secondary | ICD-10-CM | POA: Diagnosis not present

## 2015-04-11 LAB — COMPREHENSIVE METABOLIC PANEL
ALK PHOS: 109 U/L (ref 38–126)
ALT: 15 U/L — AB (ref 17–63)
AST: 21 U/L (ref 15–41)
Albumin: 3.5 g/dL (ref 3.5–5.0)
Anion gap: 11 (ref 5–15)
BUN: 30 mg/dL — AB (ref 6–20)
CALCIUM: 8.5 mg/dL — AB (ref 8.9–10.3)
CHLORIDE: 99 mmol/L — AB (ref 101–111)
CO2: 24 mmol/L (ref 22–32)
CREATININE: 0.85 mg/dL (ref 0.61–1.24)
GFR calc Af Amer: >60 mL/min (ref >60)
Glucose, Bld: 72 mg/dL (ref 65–99)
Potassium: 3.9 mmol/L (ref 3.5–5.1)
SODIUM: 134 mmol/L — AB (ref 135–145)
Total Bilirubin: 0.5 mg/dL (ref 0.3–1.2)
Total Protein: 7.1 g/dL (ref 6.5–8.1)

## 2015-04-11 LAB — CBC
HCT: 33.8 % — ABNORMAL LOW (ref 39.0–52.0)
Hemoglobin: 10.6 g/dL — ABNORMAL LOW (ref 13.0–17.0)
MCH: 30.6 pg (ref 26.0–34.0)
MCHC: 31.4 g/dL (ref 30.0–36.0)
MCV: 97.7 fL (ref 78.0–100.0)
PLATELETS: 133 10*3/uL — AB (ref 150–400)
RBC: 3.46 MIL/uL — ABNORMAL LOW (ref 4.22–5.81)
RDW: 15.6 % — AB (ref 11.5–15.5)
WBC: 6.1 10*3/uL (ref 4.0–10.5)

## 2015-04-11 LAB — I-STAT TROPONIN, ED: Troponin i, poc: 0 ng/mL (ref 0.00–0.08)

## 2015-04-11 LAB — LIPASE, BLOOD: LIPASE: 64 U/L — AB (ref 11–51)

## 2015-04-11 MED ORDER — ALBUTEROL SULFATE (2.5 MG/3ML) 0.083% IN NEBU
5.0000 mg | INHALATION_SOLUTION | Freq: Once | RESPIRATORY_TRACT | Status: AC
  Administered 2015-04-11: 5 mg via RESPIRATORY_TRACT
  Filled 2015-04-11: qty 6

## 2015-04-11 MED ORDER — OXYCODONE HCL 5 MG PO TABS
2.5000 mg | ORAL_TABLET | Freq: Once | ORAL | Status: AC
  Administered 2015-04-11: 2.5 mg via ORAL
  Filled 2015-04-11: qty 1

## 2015-04-11 MED ORDER — ALBUTEROL SULFATE HFA 108 (90 BASE) MCG/ACT IN AERS
4.0000 | INHALATION_SPRAY | Freq: Once | RESPIRATORY_TRACT | Status: AC
  Administered 2015-04-11: 4 via RESPIRATORY_TRACT
  Filled 2015-04-11: qty 6.7

## 2015-04-11 MED ORDER — ACETAMINOPHEN 500 MG PO TABS
1000.0000 mg | ORAL_TABLET | Freq: Once | ORAL | Status: AC
Start: 2015-04-11 — End: 2015-04-11
  Administered 2015-04-11: 1000 mg via ORAL
  Filled 2015-04-11: qty 2

## 2015-04-11 MED ORDER — IPRATROPIUM BROMIDE 0.02 % IN SOLN
0.5000 mg | Freq: Once | RESPIRATORY_TRACT | Status: AC
Start: 2015-04-11 — End: 2015-04-11
  Administered 2015-04-11: 0.5 mg via RESPIRATORY_TRACT
  Filled 2015-04-11: qty 2.5

## 2015-04-11 NOTE — ED Provider Notes (Signed)
CSN: [id number]     Arrival date & time 04/11/15  1806 History   First MD Initiated Contact with Patient 04/11/15 2141     Chief Complaint  Patient presents with  . Abdominal Pain     (Consider location/radiation/quality/duration/timing/severity/associated sxs/prior Treatment) HPI   69 year old male with history of GI bleed, bright red blood per rectum with known rectal mass recently released from prison, have chronic abdominal pain, presenting to ED for complaints of abdominal pain. This is his third ER visit within 1 day. Patient states he developed diffuse abdominal pain at 4 PM today. His pain was intense. He has a friend to call EMS to bring him to the ER. When asked if he had pain. He states yes. He was also seen earlier today for shortness of breath. States that shortness of breath still persists. Report worsening rectal bleeding, more than usual.  Cannot quantify the amount or frequency.  No c/o lightheadedness, dizziness, cp, back pain.  Does report headache, sob and abd pain.  Is scheduled to have colon surgery tomorrow to remove the remainder of his colon mass.  Pt is a poor historian.       Past Medical History  Diagnosis Date  . Depression   . Hypomagnesemia   . Fall at nursing home 05/27/2013    "slipped on water; said I broke my right hip" (05/27/2013)  . Migraine     "used to have them bad; haven't had one in awhile" (05/27/2013)  . Gout attack     "not too long ago; in my right foot" (05/27/2013)  . Complete heart block ([place])     reason for pacemaker/notes 05/27/2013  . Atrial fibrillation ([place])     [name] 05/27/2013  . HLD (hyperlipidemia)   . GIB (gastrointestinal bleeding)   . BPH (benign prostatic hyperplasia)   . Presence of permanent cardiac pacemaker   . Hypertension   . Gastric ulcer   . Non Hodgkin's lymphoma ([place])   . Tubular adenoma of colon    Past Surgical History  Procedure Laterality Date  . Insert / replace / remove pacemaker    .  Appendectomy    . Cholecystectomy    . Open reduction of hip Right 05/28/2013    Procedure: OPEN REDUCTION OF HIP;  Surgeon: Renette Butters, MD;  Location: [place];  Service: Orthopedics;  Laterality: Right;  . Esophagogastroduodenoscopy (egd) with propofol N/A 09/26/2014    Procedure: ESOPHAGOGASTRODUODENOSCOPY (EGD) WITH PROPOFOL;  Surgeon: Jerene Bears, MD;  Location[facility] ENDOSCOPY;  Service: Endoscopy;  Laterality: N/A;  . Colonoscopy N/A 10/01/2014    Procedure: COLONOSCOPY;  Surgeon: Jerene Bears, MD;  Location: [facility] ENDOSCOPY;  Service: Endoscopy;  Laterality: N/A;  . Partial colectomy N/A 10/05/2014    Procedure: SUBTOTAL COLECTOMY WITH ILEORECTAL ANASTOMOSIS;  Surgeon: Donnie Mesa, MD;  Location: [place];  Service: General;  Laterality: N/A;  . Flexible sigmoidoscopy N/A 11/07/2014    Procedure: FLEXIBLE SIGMOIDOSCOPY;  Surgeon: Jerene Bears, MD;  Location: [facility] ENDOSCOPY;  Service: Endoscopy;  Laterality: N/A;  . Gastric resection     Family History  Problem Relation Age of Onset  . Colon cancer Mother    Social History  Substance Use Topics  . Smoking status: Current Some Day Smoker -- 0.50 packs/day for 66 years    Types: Cigarettes  . Smokeless tobacco: Former Systems developer    Types: Snuff, Chew     Comment: 05/27/2013 "haven't used chew or snuff in ~ 30 yr"  .  Alcohol Use: No     Comment: 05/27/2013 "none in years; never had problem w/it" - not in 30 years    Review of Systems  All other systems reviewed and are negative.     Allergies  Review of patient's allergies indicates no known allergies.  Home Medications   Prior to Admission medications   Medication Sig Start Date End Date Taking? Authorizing Provider  carvedilol (COREG) 6.25 MG tablet Take 1 tablet (6.25 mg total) by mouth 2 (two) times daily with a meal. 11/16/14   Velvet Bathe, MD  finasteride (PROSCAR) 5 MG tablet Take 5 mg by mouth daily.    Historical Provider, MD   BP 102/55 mmHg  Pulse 84  Temp(Src) 97.9 F  (36.6 C) (Oral)  Resp 18  SpO2 95% Physical Exam  Constitutional: He appears well-developed and well-nourished. No distress.  Dishevel appearance, nontoxic, talking on the phone.  HENT:  Head: Atraumatic.  Eyes: Conjunctivae are normal.  Neck: Neck supple.  Cardiovascular: Normal rate and regular rhythm.   Pulmonary/Chest:  Decreased breath sounds with inspiration and expiratory wheezes.  Abdominal: Soft. Bowel sounds are normal. There is tenderness (diffuse abdominal tenderness without guarding or rebound tenderness. Abdomen nondistended. Bowel sounds intact.).  Genitourinary:  Chaperone present during exam normal rectal tone, normal color stool, no obvious mass appreciated. -FBOT  Neurological: He is alert.  Skin: No rash noted.  Psychiatric: He has a normal mood and affect.  Nursing note and vitals reviewed.   ED Course  Procedures (including critical care time)  Patient here with multiple complaints including chronic abdominal pain, rectal bleeding, shortness of breath. This is his third ER visit today for these complaints. Although patient is dishevel, he appears nontoxic. No peritoneal sign on abdominal exam concerning for perforation or bowel obstruction. No frank rectal bleeding on exam. His vital signs stable. He does have some wheezing on exam, therefore a DuoNeb treatment will be given. Will check basic labs. Care discussed with Dr. Lita Mains.  11:55 PM Labs are reassuring, pt did receive tylenol for his headache.  Dr. Lita Mains has seen and evaluate pt and with both agrees that pt is stable for discharge at this time.  He can f/u with his surgeon tomorrow as previously scheduled for surgical management.  Return precaution discussed.    Labs Review Labs Reviewed  LIPASE, BLOOD - Abnormal; Notable for the following:    Lipase 64 (*)    All other components within normal limits  COMPREHENSIVE METABOLIC PANEL - Abnormal; Notable for the following:    Sodium 134 (*)     Chloride 99 (*)    BUN 30 (*)    Calcium 8.5 (*)    ALT 15 (*)    All other components within normal limits  CBC - Abnormal; Notable for the following:    RBC 3.46 (*)    Hemoglobin 10.6 (*)    HCT 33.8 (*)    RDW 15.6 (*)    Platelets 133 (*)    All other components within normal limits  URINALYSIS, ROUTINE W REFLEX MICROSCOPIC (NOT AT [facility])  POC OCCULT BLOOD, ED    Imaging Review No results found. I have personally reviewed and evaluated these images and lab results as part of my medical decision-making.   EKG Interpretation None      MDM   Final diagnoses:  Generalized abdominal pain    BP 113/47 mmHg  Pulse 88  Temp(Src) 97.9 F (36.6 C) (Oral)  Resp 16  SpO2  93%     Domenic Moras, PA-C 04/11/15 2359  Julianne Rice, MD 04/15/15 [phone]

## 2015-04-11 NOTE — ED Notes (Signed)
Patient here with complaint of rectal bleeding for 1+ year. Reports that he is supposed to have surgery today, but doesn't know when it is scheduled.

## 2015-04-11 NOTE — ED Notes (Signed)
PA at bedside.

## 2015-04-11 NOTE — Discharge Instructions (Signed)
°Emergency Department Resource Guide °1) Find a Doctor and Pay Out of Pocket °Although you won't have to find out who is covered by your insurance plan, it is a good idea to ask around and get recommendations. You will then need to call the office and see if the doctor you have chosen will accept you as a new patient and what types of options they offer for patients who are self-pay. Some doctors offer discounts or will set up payment plans for their patients who do not have insurance, but you will need to ask so you aren't surprised when you get to your appointment. ° °2) Contact Your Local Health Department °Not all health departments have doctors that can see patients for sick visits, but many do, so it is worth a call to see if yours does. If you don't know where your local health department is, you can check in your phone book. The CDC also has a tool to help you locate your state's health department, and many state websites also have listings of all of their local health departments. ° °3) Find a Walk-in Clinic °If your illness is not likely to be very severe or complicated, you may want to try a walk in clinic. These are popping up all over the country in pharmacies, drugstores, and shopping centers. They're usually staffed by nurse practitioners or physician assistants that have been trained to treat common illnesses and complaints. They're usually fairly quick and inexpensive. However, if you have serious medical issues or chronic medical problems, these are probably not your best option. ° °No Primary Care Doctor: °- Call [facility] at  832-8000 - they can help you locate a primary care doctor that  accepts your insurance, provides certain services, etc. °- Physician Referral Service- 1-800-533-3463 ° °Chronic Pain Problems: °Organization         Address  Phone   Notes  °[facility]  (336) 297-2271 Patients need to be referred by their primary care doctor.  ° °Medication  Assistance: °Organization         Address  Phone   Notes  °[place] [facility] [address] °[place], [place] [address] (336) 641-8030 --Must be a resident of [place] °-- Must have NO insurance coverage whatsoever (no Medicaid/ Medicare, etc.) °-- The pt. MUST have a primary care doctor that directs their care regularly and follows them in the community °  °[facility]  (866) 331-1348   °[employer]  (888) 892-1162   ° °Agencies that provide inexpensive medical care: °Organization         Address  Phone   Notes  °[facility]  (336) 832-8035   °[facility]    (336) 832-7272   °[facility] [address] °[place],  [address] (336) 832-4777   °[facility] [address], °[place] (336) 271-4999   °[facility]    (336) 373-0678   °[facility]    (336) 272-1050   °[facility] ° [address], [place] Phone:  (336) 832-4444, Fax:  (336) 832-4440 Hours of Operation:  9 am - 6 pm, M-F.  Also accepts Medicaid/Medicare and self-pay.  °[facility] ° [address], [place] Phone: (336) 832-3150, Fax: (336) 832-3151. Hours of Operation:  8:30 am - 5:30 pm, M-F.  Also accepts Medicaid and self-pay.  °[facility] [place] [address], [place] Phone: (336) 878-6027   °[facility] [address], [place], [place] (336)723-1848, Ext. 123 Mondays & Thursdays: 7-9 AM.  First 15 patients are seen on a first come, first serve basis. °  ° °Medicaid-accepting [place] Providers: ° °Organization         Address  Phone   Notes  °[facility] [address], [place] (336) 641-2100 Also accepts self-pay patients.  °[facility] [address], [place] ° (336) 856-9996   °[facility] [address], [place]  (336) 288-8857   °[facility] [address] [place] [address], [place] (336) 299-7000   °Veita Bland [address], [place]  ° (336) 373-1557 Only accepts [place] Access Medicaid patients after they have their name applied to their card.  ° °Self-Pay (no insurance) in [place]: ° °Organization         Address  Phone   Notes  °Sickle Cell Patients, [facility] [address], [place] (336) 832-1970   °[facility] [address], [place] (336) 832-4400   °[facility] ° [address], [place] (336) 992-4800   °[facility]/Dr. Osei-Bonsu ° [address] [place] [address], [place] or [address], [place] (336) 841-8500 Phone number for both [place] and [place] locations is the same.  °[facility] [address], [place] (336) 299-0000   °[facility] [address] [place] [address], [place] or [address] (336) 852-7530 °(336) 878-2260   °[facility] [address], [place] (336) 350-1642, phone; (336) 294-5005, fax Sees patients 1st and 3rd Saturday of every month.  Must not qualify for public or private insurance (i.e. Medicaid, Medicare, [place] Health Choice, Veterans' Benefits) • Household income should be no more than 200% of the poverty level •The clinic cannot treat you if you are pregnant or think you are pregnant • Sexually transmitted diseases are not treated at the clinic.  ° ° °Dental Care: °Organization         Address  Phone  Notes  °[place] [facility] [address], [place] (336) 641-6152 Accepts children up to age 21 who are enrolled in Medicaid or [place] Health Choice; pregnant women with a Medicaid card; and children who have applied for Medicaid or [place] Health Choice, but were declined, whose parents can pay a reduced fee at time of service.  °[place]  [facility] [place]  [address], [place] (336) 641-7733 Accepts children up to age 21 who are enrolled in Medicaid or [place] Health Choice; pregnant women with a Medicaid card; and children who have applied for Medicaid or [place] Health Choice, but were declined, whose parents can pay a reduced fee at time of service.  °[facility] ° [address],  (336) 641-4533 Patients are seen by appointment only. Walk-ins are not accepted. [facility] will see patients 18 years of age and older. °Monday - Tuesday (8am-5pm) °Most Wednesdays (8:30-5pm) °$30 per visit, cash only  °[facility] ° [address], [place] (336) 641-4533 Patients are seen by appointment only. Walk-ins are not accepted. [facility] will see patients 18 years of age and older. °One   Wednesday Evening (Monthly: Volunteer Based).  $30 per visit, cash only  °[facility]  (919) 537-3737 for adults; Children under age 4, call Graduate Pediatric Dentistry at (919) [phone]. Children aged 4-14, please call (919) [phone] to request a pediatric application. ° Dental services are provided in all areas of dental care including fillings, crowns and bridges, complete and partial dentures, implants, gum treatment, root canals, and extractions. Preventive care is also provided. Treatment is provided to both adults and children. °Patients are selected via a lottery and there is often a waiting list. °  °[facility] [address], °[place] ° (336) 763-8833 [email or web address] °  °[facility] [address], [place], [place] (336)723-1848, Ext. 123 Second and Fourth Thursday of each month, opens at 6:30 AM; Clinic ends at 9 AM.  Patients are seen on a first-come first-served basis, and a limited number are seen during each clinic.  ° °[facility] ° [address], [place], [place] (336) 723-7904    Eligibility Requirements °You must have lived in [place], [place], or [place] counties for at least the last three months. °  You cannot be eligible for state or federal sponsored healthcare insurance, including Veterans Administration, Medicaid, or Medicare. °  You generally cannot be eligible for healthcare insurance through your employer.  °  How to apply: °Eligibility screenings are held every Tuesday and Wednesday afternoon from 1:00 pm until 4:00 pm. You do not need an appointment for the interview!  °[facility] [address], [place], [place] 336-631-2330   °[facility]  336-342-8273   °[facility]  336-703-3100   °[facility]  336-570-6415   ° °Behavioral Health Resources in the Community: °Intensive Outpatient Programs °Organization         Address  Phone  Notes  °[facility] [address], [place], [place] 336-878-6098   °[facility] [address], [place], [place] 336-832-9800   °[facility] [address], [place], [place] ° 336-882-2125   °[facility] [address],  °[place], [place] 1-800-853-5163 or 336-641-4981   °Substance Abuse Resources °Organization         Address  Phone  Notes  °[facility]  336-882-2125   °[facility]  336-784-9470   °[facility]  336-285-9073   °[facility]  336-845-3988   °Residential & Outpatient Substance Abuse Program  1-800-659-3381   °Psychological Services °Organization         Address  Phone  Notes  °[facility]  336- 832-9600   °[facility]  336- 378-7881   °[facility] [address], [place] 1-800-853-5163 or 336-641-4981   ° °Mobile Crisis Teams °Organization         Address  Phone  Notes  °[facility], Mobile Crisis Care Unit  1-877-626-1772   °Assertive °[facility] ° [address]  [place], [place] 336-834-9664   °Sharon DeEsch [address] °[place] 336-554-5454   ° °Self-Help/Support Groups °Organization         Address  Phone             Notes  °[facility]  - variety of support groups  336- 373-1402 Call for more information  °Narcotics Anonymous (NA), [facility] [address], °[place]  2 meetings at this location  ° °  Residential Treatment Programs °Organization         Address  Phone  Notes  °[facility] [address],    °[place]  1-866-801-8205   °[facility] ° [address], [place], [place] 704-293-8524   °[facility] [address], [place] 336-845-3988 Admissions: 8am-3pm M-F  °[facility] [address]    °[place], [place] 336-841-1104   °[facility] [address], [place], [place] 336-379-7146   °[facility] [address]  °[place], [place] 336-285-9073   °[facility] - Intensive Outpatient [address], [place], [place] 336-852-3033   °[facility] [address]  °[place], [place] 1-877-615-2722 or 336-784-9470   °[facility] [address] [place], [place] 336-227-7417 Accepts Medicaid  °[facility] [address]  ° [place] 1-800-659-3381 Substance Abuse/Addiction Treatment  ° °[place] Behavioral Health Resources °Organization         Address  Phone  Notes  °[facility]  (888) 581-9988   °Julie Brannon, PhD [address] [place], [place]   (336) 349-5553 or (336) 951-0000   °[facility]   [address] °[place], [place] (336) 349-4454   °[facility] [address], [place], [place] (336) 342-8316 Insurance/Medicaid/sponsorship through [facility]  °[facility] [address]                                    [place], [place] (336) 342-8316 Therapy/tele-psych/case    °[facility] [address]  ° [place], [place] (336) 349-2233    °Dr. Arfeen  (336) 349-4544   °[facility] [place]  [employer] [place] [facility] 1) [address], [place] °2) [address], [place] °3)  [address], [place] (336) 349-3220 °(336) 342-7768 ° °(336) 342-8140   °[place] [facility] (336) 342-1394 or (336) 342-3537 (After Hours)    ° ° °

## 2015-04-11 NOTE — ED Notes (Signed)
Pt completely dressed, taken self off monitor, still has IV in left hand-- states "I have to go outside and smoke!" pt has taken multiple pairs of socks, tape and betadine swabs. Cupboards were ziptied, but not drawers. Told pt that we would have to zip tie all drawers and cabinets when he is here. Pt escorted to door, discharged per Dr. Billy Fischer.

## 2015-04-11 NOTE — ED Provider Notes (Signed)
CSN: [id number]     Arrival date & time 04/11/15  0515 History   First MD Initiated Contact with Patient 04/11/15 0531     Chief Complaint  Patient presents with  . Rectal Bleeding     (Consider location/radiation/quality/duration/timing/severity/associated sxs/prior Treatment) Patient is a 69 y.o. male presenting with hematochezia. The history is provided by the patient.  Rectal Bleeding Quality:  Bright red Amount:  Scant Duration:  24 months Timing:  Intermittent Progression:  Waxing and waning Chronicity:  New Similar prior episodes: yes   Relieved by:  Nothing Worsened by:  Nothing tried Ineffective treatments:  None tried Associated symptoms: no abdominal pain, no fever and no vomiting    69 yo M with BRBPR, this been an ongoing issue. Patient has a known rectal mass which is believed to be the source of the bleeding. Patient states this morning he just got out of prison and fell he had no rest ago. States now he notes that he has a place and is willing to go there. States that he fell he has chronic abdominal pain was slightly worse. Denies any lightheadedness feeling like he'll pass out. Denies any significant increase of bleeding. Denies any real change in his abdominal pain. States that he is recently found someone who will marry him. He is excited about this.  Past Medical History  Diagnosis Date  . Depression   . Hypomagnesemia   . Fall at nursing home 05/27/2013    "slipped on water; said I broke my right hip" (05/27/2013)  . Migraine     "used to have them bad; haven't had one in awhile" (05/27/2013)  . Gout attack     "not too long ago; in my right foot" (05/27/2013)  . Complete heart block     reason for pacemaker/notes 05/27/2013  . Atrial fibrillation     [name] 05/27/2013  . HLD (hyperlipidemia)   . GIB (gastrointestinal bleeding)   . BPH (benign prostatic hyperplasia)   . Presence of permanent cardiac pacemaker   . Hypertension   . Gastric ulcer   .  Non Hodgkin's lymphoma   . Tubular adenoma of colon    Past Surgical History  Procedure Laterality Date  . Insert / replace / remove pacemaker    . Appendectomy    . Cholecystectomy    . Open reduction of hip Right 05/28/2013    Procedure: OPEN REDUCTION OF HIP;  Surgeon: Renette Butters, MD;  Location: [facility];  Service: Orthopedics;  Laterality: Right;  . Esophagogastroduodenoscopy (egd) with propofol N/A 09/26/2014    Procedure: ESOPHAGOGASTRODUODENOSCOPY (EGD) WITH PROPOFOL;  Surgeon: Jerene Bears, MD;  Location[facility] END[facility]COPY;  Service: Endoscopy;  Laterality: N/A;  . Colonoscopy N/A 10/01/2014    Procedure: COLONOSCOPY;  Surgeon: Jerene Bears, MD;  Location: [facility] ENDOSCOPY;  Service: Endoscopy;  Laterality: N/A;  . Partial colectomy N/A 10/05/2014    Procedure: SUBTOTAL COLECTOMY WITH ILEORECTAL ANASTOMOSIS;  Surgeon: Donnie Mesa, MD;  Location: [facility];  Service: General;  Laterality: N/A;  . Flexible sigmoidoscopy N/A 11/07/2014    Procedure: FLEXIBLE SIGMOIDOSCOPY;  Surgeon: Jerene Bears, MD;  Location[facility] END[facility];  Service: Endoscopy;  Laterality: N/A;  . Gastric resection     Family History  Problem Relation Age of Onset  . Colon cancer Mother    Social History  Substance Use Topics  . Smoking status: Current Some Day Smoker -- 0.50 packs/day for 66 years    Types: Cigarettes  . Smokeless tobacco:  Former User    Types: Snuff, Chew     Comment: 05/27/2013 "haven't used chew or snuff in ~ 30 yr"  . Alcohol Use: No     Comment: 05/27/2013 "none in years; never had problem w/it" - not in 30 years    Review of Systems  Constitutional: Negative for fever and chills.  HENT: Negative for congestion and facial swelling.   Eyes: Negative for discharge and visual disturbance.  Respiratory: Negative for shortness of breath.   Cardiovascular: Negative for chest pain and palpitations.  Gastrointestinal: Positive for blood in stool and hematochezia. Negative for vomiting, abdominal  pain and diarrhea.  Musculoskeletal: Negative for myalgias and arthralgias.  Skin: Negative for color change and rash.  Neurological: Negative for tremors, syncope and headaches.  Psychiatric/Behavioral: Negative for confusion and dysphoric mood.      Allergies  Review of patient's allergies indicates no known allergies.  Home Medications   Prior to Admission medications   Medication Sig Start Date End Date Taking? Authorizing Provider  carvedilol (COREG) 6.25 MG tablet Take 1 tablet (6.25 mg total) by mouth 2 (two) times daily with a meal. 11/16/14   Velvet Bathe, MD  finasteride (PROSCAR) 5 MG tablet Take 5 mg by mouth daily.    Historical Provider, MD  warfarin (COUMADIN) 5 MG tablet Take 5 mg by mouth daily. At [place] Provider, MD   BP 137/65 mmHg  Pulse 52  Temp(Src) 97.6 F (36.4 C) (Oral)  Resp 20  SpO2 100% Physical Exam  Constitutional: He is oriented to person, place, and time. He appears well-developed and well-nourished.  HENT:  Head: Normocephalic and atraumatic.  Eyes: EOM are normal. Pupils are equal, round, and reactive to light.  Neck: Normal range of motion. Neck supple. No JVD present.  Cardiovascular: Normal rate and regular rhythm.  Exam reveals no gallop and no friction rub.   No murmur heard. Pulmonary/Chest: No respiratory distress. He has no wheezes.  Abdominal: He exhibits no distension. There is no tenderness. There is no rebound and no guarding.  Distractible, patient initially with no pain then realized as pushing on his abdomen and rolled around on the bed.  Musculoskeletal: Normal range of motion.  Neurological: He is alert and oriented to person, place, and time.  Skin: No rash noted. No pallor.  Psychiatric: He has a normal mood and affect. His behavior is normal.  Nursing note and vitals reviewed.   ED Course  Procedures (including critical care time) Labs Review Labs Reviewed - No data to display  Imaging Review No results  found. I have personally reviewed and evaluated these images and lab results as part of my medical decision-making.   EKG Interpretation None      MDM   Final diagnoses:  Chronic abdominal pain    69 yo M with a chief complaint of bright red blood per rectum. Chronic per patient no significant changes. Patient only came here because a normal scope. Feels like he has a place to go now. Requesting pain medicines. Will give patient Tylenol, 0.5 mg roxi.  PCP follow up. Feel no need for laboratory evaluation as patient has had no change in any of his symptoms. PCP follow up.  5:43 AM:  I have discussed the diagnosis/risks/treatment options with the patient and believe the pt to be eligible for discharge home to follow-up with PCP. We also discussed returning to the ED immediately if new or worsening sx occur. We discussed the sx which  are most concerning (e.g., sudden worsening pain, fever, inability to tolerate by mouth) that necessitate immediate return. Medications administered to the patient during their visit and any new prescriptions provided to the patient are listed below.  Medications given during this visit Medications  acetaminophen (TYLENOL) tablet 1,000 mg (not administered)  oxyCODONE (Oxy IR/ROXICODONE) immediate release tablet 2.5 mg (not administered)    New Prescriptions   No medications on file    The patient appears reasonably screen and/or stabilized for discharge and I doubt any other medical condition or other [facility] requiring further screening, evaluation, or treatment in the ED at this time prior to discharge.      Deno Etienne, DO 04/11/15 [phone]

## 2015-04-11 NOTE — ED Notes (Signed)
[facility] [place] streets" states spent the night in jail, was just d/c'd from ed last night. Pt c/o chest pain, but asking for peanut butter crackers, coke and blankets. States is getting married on Sunday, move to [place],  And is having surgery tomorrow on colon.  Pt is itching everywhere, has scabs all over body, picking at scabs making them bleed. Instructed to not pick at scabs.

## 2015-04-11 NOTE — ED Notes (Signed)
Called for triage, no answer

## 2015-04-11 NOTE — Discharge Instructions (Signed)
Follow with your family doctor and your surgeon Abdominal Pain, Adult Many things can cause abdominal pain. Usually, abdominal pain is not caused by a disease and will improve without treatment. It can often be observed and treated at home. Your health care provider will do a physical exam and possibly order blood tests and X-rays to help determine the seriousness of your pain. However, in many cases, more time must pass before a clear cause of the pain can be found. Before that point, your health care provider may not know if you need more testing or further treatment. HOME CARE INSTRUCTIONS Monitor your abdominal pain for any changes. The following actions may help to alleviate any discomfort you are experiencing:  Only take over-the-counter or prescription medicines as directed by your health care provider.  Do not take laxatives unless directed to do so by your health care provider.  Try a clear liquid diet (broth, tea, or water) as directed by your health care provider. Slowly move to a bland diet as tolerated. SEEK MEDICAL CARE IF:  You have unexplained abdominal pain.  You have abdominal pain associated with nausea or diarrhea.  You have pain when you urinate or have a bowel movement.  You experience abdominal pain that wakes you in the night.  You have abdominal pain that is worsened or improved by eating food.  You have abdominal pain that is worsened with eating fatty foods.  You have a fever. SEEK IMMEDIATE MEDICAL CARE IF:  Your pain does not go away within 2 hours.  You keep throwing up (vomiting).  Your pain is felt only in portions of the abdomen, such as the right side or the left lower portion of the abdomen.  You pass bloody or black tarry stools. MAKE SURE YOU:  Understand these instructions.  Will watch your condition.  Will get help right away if you are not doing well or get worse.   This information is not intended to replace advice given to you by  your health care provider. Make sure you discuss any questions you have with your health care provider.   Document Released: 03/06/2005 Document Revised: 02/15/2015 Document Reviewed: 02/03/2013 Elsevier Interactive Patient Education [employer].

## 2015-04-11 NOTE — ED Notes (Signed)
Report from EMS - pt was found laying in the grass outside of [place]. Pt c/o abd pain and bloody stools. Pt is scheduled for colon surgery tomorrow and his MD told him that that is normal until his surgery. Vital signs were stable.

## 2015-04-11 NOTE — ED Provider Notes (Signed)
CSN: [id number]     Arrival date & time 04/11/15  1130 History   First MD Initiated Contact with Patient 04/11/15 1132     Chief Complaint  Patient presents with  . Chest Pain  . Pruritis     (Consider location/radiation/quality/duration/timing/severity/associated sxs/prior Treatment) HPI Comments: Was here this AM with CP [facility], 10, similar to prior Middle of chest Nothing better or worse Started at [facility]    Patient is a 69 y.o. male presenting with chest pain.  Chest Pain Associated symptoms: shortness of breath   Associated symptoms: no abdominal pain, no back pain, no fever, no headache, no nausea and not vomiting     Past Medical History  Diagnosis Date  . Depression   . Hypomagnesemia   . Fall at nursing home 05/27/2013    "slipped on water; said I broke my right hip" (05/27/2013)  . Migraine     "used to have them bad; haven't had one in awhile" (05/27/2013)  . Gout attack     "not too long ago; in my right foot" (05/27/2013)  . Complete heart block ([place])     reason for pacemaker/notes 05/27/2013  . Atrial fibrillation ([place])     [name] 05/27/2013  . HLD (hyperlipidemia)   . GIB (gastrointestinal bleeding)   . BPH (benign prostatic hyperplasia)   . Presence of permanent cardiac pacemaker   . Hypertension   . Gastric ulcer   . Non Hodgkin's lymphoma ([place])   . Tubular adenoma of colon    Past Surgical History  Procedure Laterality Date  . Insert / replace / remove pacemaker    . Appendectomy    . Cholecystectomy    . Open reduction of hip Right 05/28/2013    Procedure: OPEN REDUCTION OF HIP;  Surgeon: Renette Butters, MD;  Location: [place];  Service: Orthopedics;  Laterality: Right;  . Esophagogastroduodenoscopy (egd) with propofol N/A 09/26/2014    Procedure: ESOPHAGOGASTRODUODENOSCOPY (EGD) WITH PROPOFOL;  Surgeon: Jerene Bears, MD;  Location[facility]COPY;  Service: Endoscopy;  Laterality: N/A;  . Colonoscopy N/A 10/01/2014    Procedure: COLONOSCOPY;   Surgeon: Jerene Bears, MD;  Location: [facility] ENDOSCOPY;  Service: Endoscopy;  Laterality: N/A;  . Partial colectomy N/A 10/05/2014    Procedure: SUBTOTAL COLECTOMY WITH ILEORECTAL ANASTOMOSIS;  Surgeon: Donnie Mesa, MD;  Location: [place];  Service: General;  Laterality: N/A;  . Flexible sigmoidoscopy N/A 11/07/2014    Procedure: FLEXIBLE SIGMOIDOSCOPY;  Surgeon: Jerene Bears, MD;  Location: [facility] ENDOSCOPY;  Service: Endoscopy;  Laterality: N/A;  . Gastric resection     Family History  Problem Relation Age of Onset  . Colon cancer Mother    Social History  Substance Use Topics  . Smoking status: Current Some Day Smoker -- 0.50 packs/day for 66 years    Types: Cigarettes  . Smokeless tobacco: Former Systems developer    Types: Snuff, Chew     Comment: 05/27/2013 "haven't used chew or snuff in ~ 30 yr"  . Alcohol Use: No     Comment: 05/27/2013 "none in years; never had problem w/it" - not in 30 years    Review of Systems  Constitutional: Negative for fever.  HENT: Negative for sore throat.   Eyes: Negative for visual disturbance.  Respiratory: Positive for shortness of breath and wheezing.   Cardiovascular: Positive for chest pain.  Gastrointestinal: Positive for diarrhea. Negative for nausea, vomiting and abdominal pain.  Genitourinary: Negative for difficulty urinating.  Musculoskeletal: Negative for back pain  and neck stiffness.  Skin: Negative for rash.  Neurological: Negative for syncope and headaches.      Allergies  Review of patient's allergies indicates no known allergies.  Home Medications   Prior to Admission medications   Medication Sig Start Date End Date Taking? Authorizing Provider  carvedilol (COREG) 6.25 MG tablet Take 1 tablet (6.25 mg total) by mouth 2 (two) times daily with a meal. 11/16/14  Yes Velvet Bathe, MD  finasteride (PROSCAR) 5 MG tablet Take 5 mg by mouth daily.   Yes Historical Provider, MD   BP 118/64 mmHg  Pulse 77  Temp(Src) 98.9 F (37.2 C) (Oral)  Resp  20  Ht 5\' 9"  (1.753 m)  SpO2 98% Physical Exam  Constitutional: He is oriented to person, place, and time. He appears well-developed. He appears cachectic. No distress.  disheveled  HENT:  Head: Normocephalic and atraumatic.  Eyes: Conjunctivae and EOM are normal.  Neck: Normal range of motion.  Cardiovascular: Normal rate, regular rhythm, normal heart sounds and intact distal pulses.  Exam reveals no gallop and no friction rub.   No murmur heard. Pulmonary/Chest: Effort normal. No respiratory distress. He has wheezes. He has no rales.  Abdominal: Soft. He exhibits no distension. There is no tenderness. There is no guarding.  Musculoskeletal: He exhibits no edema.  Neurological: He is alert and oriented to person, place, and time.  Skin: Skin is warm and dry. He is not diaphoretic.  Dried stool present over left leg  Nursing note and vitals reviewed.   ED Course  Procedures (including critical care time) [place], ED    Imaging Review No results found. I have personally reviewed and evaluated these images and lab results as part of my medical decision-making.   EKG Interpretation   Date/Time:  Tuesday April 11 2015 11:43:02 EDT Ventricular Rate:  86 PR Interval:    QRS Duration: 108 QT Interval:  424 QTC Calculation: 507 R Axis:   129 Text Interpretation:  Atrial fibrillation Right axis deviation No  significant change since last tracing Confirmed by [facility] MD, [place]  ([id number]) on 04/11/2015 9:12:16 PM      MDM   Final diagnoses:  Chest pain, unspecified chest pain type  Wheezing   69yo male with history of atrial fibrillation not on anticoagulation, cardiac pacemaker, hypertension, non-hodgkins lymphoma, presents with concern for chest pain. Patient was discharged from the ED hours ago for visit for concern of chronic GI bleedingx1 year. Chest pain similar to multiple prior presentations.  EKG shows atrial fibrillation  similar to prior.  ISTAT troponin negative. Patient with mild wheezing on exam (signs of emphysema seen on previous CT) and pt given albuterol inhaler.  No crackles, no pulmonary edema, no fever and doubt pneumonia or CHF. The rectal bleeding he states has been chronic over last year and unchanged, and given pt HDS, with hgb at baseline 1 mos ago doubt acute worsening.  Patient concerned regarding obtaining food, attempting to steal socks from the drawers and leave to smoke cigarette and doubt acute pathology. Patient discharged in stable condition with understanding of reasons to return.    Gareth Morgan, MD 04/11/15 2123

## 2015-04-11 NOTE — Discharge Instructions (Signed)

## 2015-04-12 ENCOUNTER — Encounter (HOSPITAL_COMMUNITY): Payer: Self-pay | Admitting: Emergency Medicine

## 2015-04-12 ENCOUNTER — Emergency Department (HOSPITAL_COMMUNITY)
Admission: EM | Admit: 2015-04-12 | Discharge: 2015-04-12 | Disposition: A | Discharge status: Home / Self Care | Payer: Medicare Other | Attending: Emergency Medicine | Admitting: Emergency Medicine

## 2015-04-12 ENCOUNTER — Encounter (HOSPITAL_COMMUNITY): Payer: Self-pay | Admitting: Neurology

## 2015-04-12 ENCOUNTER — Emergency Department (HOSPITAL_COMMUNITY): Payer: Medicare Other

## 2015-04-12 ENCOUNTER — Emergency Department (HOSPITAL_COMMUNITY)
Admission: EM | Admit: 2015-04-12 | Discharge: 2015-04-12 | Disposition: A | Discharge status: Home / Self Care | Payer: Medicare Other | Source: Home / Self Care | Attending: Emergency Medicine | Admitting: Emergency Medicine

## 2015-04-12 DIAGNOSIS — D125 Benign neoplasm of sigmoid colon: Secondary | ICD-10-CM

## 2015-04-12 DIAGNOSIS — I10 Essential (primary) hypertension: Secondary | ICD-10-CM | POA: Insufficient documentation

## 2015-04-12 DIAGNOSIS — R0789 Other chest pain: Secondary | ICD-10-CM

## 2015-04-12 DIAGNOSIS — Z950 Presence of cardiac pacemaker: Secondary | ICD-10-CM | POA: Insufficient documentation

## 2015-04-12 DIAGNOSIS — Z7982 Long term (current) use of aspirin: Secondary | ICD-10-CM | POA: Diagnosis not present

## 2015-04-12 DIAGNOSIS — Z8739 Personal history of other diseases of the musculoskeletal system and connective tissue: Secondary | ICD-10-CM | POA: Insufficient documentation

## 2015-04-12 DIAGNOSIS — Z720 Tobacco use: Secondary | ICD-10-CM | POA: Diagnosis not present

## 2015-04-12 DIAGNOSIS — K625 Hemorrhage of anus and rectum: Secondary | ICD-10-CM

## 2015-04-12 DIAGNOSIS — R062 Wheezing: Secondary | ICD-10-CM | POA: Insufficient documentation

## 2015-04-12 DIAGNOSIS — K6389 Other specified diseases of intestine: Secondary | ICD-10-CM | POA: Diagnosis not present

## 2015-04-12 DIAGNOSIS — R0602 Shortness of breath: Secondary | ICD-10-CM | POA: Diagnosis not present

## 2015-04-12 DIAGNOSIS — Z8639 Personal history of other endocrine, nutritional and metabolic disease: Secondary | ICD-10-CM | POA: Insufficient documentation

## 2015-04-12 DIAGNOSIS — Z87828 Personal history of other (healed) physical injury and trauma: Secondary | ICD-10-CM | POA: Diagnosis not present

## 2015-04-12 DIAGNOSIS — Z9049 Acquired absence of other specified parts of digestive tract: Secondary | ICD-10-CM | POA: Diagnosis not present

## 2015-04-12 DIAGNOSIS — Z87438 Personal history of other diseases of male genital organs: Secondary | ICD-10-CM | POA: Diagnosis not present

## 2015-04-12 DIAGNOSIS — Z8659 Personal history of other mental and behavioral disorders: Secondary | ICD-10-CM | POA: Diagnosis not present

## 2015-04-12 DIAGNOSIS — R1032 Left lower quadrant pain: Secondary | ICD-10-CM

## 2015-04-12 DIAGNOSIS — Z8572 Personal history of non-Hodgkin lymphomas: Secondary | ICD-10-CM | POA: Diagnosis not present

## 2015-04-12 DIAGNOSIS — R1030 Lower abdominal pain, unspecified: Secondary | ICD-10-CM | POA: Diagnosis present

## 2015-04-12 LAB — BASIC METABOLIC PANEL
ANION GAP: 9 (ref 5–15)
BUN: 22 mg/dL — ABNORMAL HIGH (ref 6–20)
CALCIUM: 8.9 mg/dL (ref 8.9–10.3)
CO2: 22 mmol/L (ref 22–32)
Chloride: 109 mmol/L (ref 101–111)
Creatinine, Ser: 0.9 mg/dL (ref 0.61–1.24)
Glucose, Bld: 116 mg/dL — ABNORMAL HIGH (ref 65–99)
POTASSIUM: 4.4 mmol/L (ref 3.5–5.1)
Sodium: 140 mmol/L (ref 135–145)

## 2015-04-12 LAB — I-STAT CHEM 8, ED
BUN: 23 mg/dL — AB (ref 6–20)
CALCIUM ION: 1.19 mmol/L (ref 1.13–1.30)
CHLORIDE: 107 mmol/L (ref 101–111)
CREATININE: 0.9 mg/dL (ref 0.61–1.24)
GLUCOSE: 123 mg/dL — AB (ref 65–99)
HCT: 33 % — ABNORMAL LOW (ref 39.0–52.0)
Hemoglobin: 11.2 g/dL — ABNORMAL LOW (ref 13.0–17.0)
Potassium: 3.8 mmol/L (ref 3.5–5.1)
Sodium: 142 mmol/L (ref 135–145)
TCO2: 22 mmol/L (ref 0–100)

## 2015-04-12 LAB — CBC
HCT: 33.2 % — ABNORMAL LOW (ref 39.0–52.0)
HEMOGLOBIN: 10.4 g/dL — AB (ref 13.0–17.0)
MCH: 30.4 pg (ref 26.0–34.0)
MCHC: 31.3 g/dL (ref 30.0–36.0)
MCV: 97.1 fL (ref 78.0–100.0)
Platelets: 135 10*3/uL — ABNORMAL LOW (ref 150–400)
RBC: 3.42 MIL/uL — AB (ref 4.22–5.81)
RDW: 15.5 % (ref 11.5–15.5)
WBC: 7.2 10*3/uL (ref 4.0–10.5)

## 2015-04-12 LAB — I-STAT TROPONIN, ED: TROPONIN I, POC: 0.01 ng/mL (ref 0.00–0.08)

## 2015-04-12 LAB — POC OCCULT BLOOD, ED: Fecal Occult Bld: POSITIVE — AB

## 2015-04-12 MED ORDER — ACETAMINOPHEN 325 MG PO TABS
650.0000 mg | ORAL_TABLET | Freq: Once | ORAL | Status: AC
  Administered 2015-04-12: 650 mg via ORAL
  Filled 2015-04-12: qty 2

## 2015-04-12 NOTE — ED Provider Notes (Signed)
Planes of lower abdominal pain onset yesterday, and by rectal bleeding. He also complains of left foot pain typical of his gout since yesterday.[name] He reports she's had similar pain for approximately a year in his left foot Patient is homeless. He drank beer earlier this morning. He is presently hungry. On exam chronically ill-appearing abdomen midline surgical scar, nondistended. Tender at bilateral lower quadrants. Genitalia normal male. Left lower extremity tender diffusely over the dorsum of foot DP pulse 2+ foot is not red or warm  Orlie Dakin, MD 04/12/15 1620

## 2015-04-12 NOTE — ED Notes (Signed)
Pt received bus pass.

## 2015-04-12 NOTE — ED Notes (Signed)
Pt coming from [place], reports middle abd pain, rectal bleeding, and gout in left foot since this morning. Denies n/v,d. BM x 4 today with bright red blood.

## 2015-04-12 NOTE — ED Notes (Signed)
MD at bedside. 

## 2015-04-12 NOTE — Discharge Instructions (Signed)
As discussed, your evaluation today has been largely reassuring.  But, it is important that you monitor your condition carefully, and do not hesitate to return to the ED if you develop new, or concerning changes in your condition. ? ?Otherwise, please follow-up with your physician for appropriate ongoing care. ? ?

## 2015-04-12 NOTE — ED Provider Notes (Signed)
CSN: [id number]     Arrival date & time 04/12/15  1914 History   First MD Initiated Contact with Patient 04/12/15 2206     Chief Complaint  Patient presents with  . Chest Pain    THe police department called EMS from a nursing home.  The patient was there acting eratically and he is not a resident at the facility.  He advised them he was having chest pain and EMS brought him to the ED.     (Consider location/radiation/quality/duration/timing/severity/associated sxs/prior Treatment) HPI Patient presents with concern of chest pain. Patient is unclear when it began. Patient called EMS from a nursing facility, where he is not a resident, per report. EMS reports that the patient biting chest pain, denied any drug use, changes in medical condition, but stated that he had chest pain. On my exam the patient is requesting a place to sleep until tomorrow.  Past Medical History  Diagnosis Date  . Depression   . Hypomagnesemia   . Fall at nursing home 05/27/2013    "slipped on water; said I broke my right hip" (05/27/2013)  . Migraine     "used to have them bad; haven't had one in awhile" (05/27/2013)  . Gout attack     "not too long ago; in my right foot" (05/27/2013)  . Complete heart block ([place])     reason for pacemaker/notes 05/27/2013  . Atrial fibrillation ([place])     [name] 05/27/2013  . HLD (hyperlipidemia)   . GIB (gastrointestinal bleeding)   . BPH (benign prostatic hyperplasia)   . Presence of permanent cardiac pacemaker   . Hypertension   . Gastric ulcer   . Non Hodgkin's lymphoma ([place])   . Tubular adenoma of colon    Past Surgical History  Procedure Laterality Date  . Insert / replace / remove pacemaker    . Appendectomy    . Cholecystectomy    . Open reduction of hip Right 05/28/2013    Procedure: OPEN REDUCTION OF HIP;  Surgeon: Renette Butters, MD;  Location: [place];  Service: Orthopedics;  Laterality: Right;  . Esophagogastroduodenoscopy (egd) with propofol N/A  09/26/2014    Procedure: ESOPHAGOGASTRODUODENOSCOPY (EGD) WITH PROPOFOL;  Surgeon: Jerene Bears, MD;  Location: [facility] ENDOSCOPY;  Service: Endoscopy;  Laterality: N/A;  . Colonoscopy N/A 10/01/2014    Procedure: COLONOSCOPY;  Surgeon: Jerene Bears, MD;  Location: [facility] ENDOSCOPY;  Service: Endoscopy;  Laterality: N/A;  . Partial colectomy N/A 10/05/2014    Procedure: SUBTOTAL COLECTOMY WITH ILEORECTAL ANASTOMOSIS;  Surgeon: Donnie Mesa, MD;  Location: [place];  Service: General;  Laterality: N/A;  . Flexible sigmoidoscopy N/A 11/07/2014    Procedure: FLEXIBLE SIGMOIDOSCOPY;  Surgeon: Jerene Bears, MD;  Location: [facility] ENDOSCOPY;  Service: Endoscopy;  Laterality: N/A;  . Gastric resection     Family History  Problem Relation Age of Onset  . Colon cancer Mother    Social History  Substance Use Topics  . Smoking status: Current Some Day Smoker -- 0.50 packs/day for 66 years    Types: Cigarettes  . Smokeless tobacco: Former Systems developer    Types: Snuff, Chew     Comment: 05/27/2013 "haven't used chew or snuff in ~ 30 yr"  . Alcohol Use: No     Comment: 05/27/2013 "none in years; never had problem w/it" - not in 30 years    Review of Systems  Constitutional: Negative for fever.  Respiratory: Negative for shortness of breath.   Cardiovascular: Negative for chest  pain.  Musculoskeletal:       Negative aside from HPI  Skin:       Negative aside from HPI  Allergic/Immunologic: Negative for immunocompromised state.  Neurological: Negative for weakness.      Allergies  Review of patient's allergies indicates no known allergies.  Home Medications   Prior to Admission medications   Medication Sig Start Date End Date Taking? Authorizing Provider  acetaminophen (TYLENOL) 500 MG tablet Take 500 mg by mouth every 6 (six) hours as needed for mild pain.    Historical Provider, MD  aspirin 81 MG tablet Take 81 mg by mouth daily.    Historical Provider, MD  carvedilol (COREG) 6.25 MG tablet Take 1 tablet (6.25  mg total) by mouth 2 (two) times daily with a meal. Patient not taking: Reported on 04/11/2015 11/16/14   Velvet Bathe, MD   BP 145/62 mmHg  Pulse 44  Temp(Src) 99.6 F (37.6 C) (Oral)  Resp 16  SpO2 100% Physical Exam  Constitutional: He is oriented to person, place, and time. No distress.  Frail and Disheveled  Eyes: EOM are normal. Pupils are equal, round, and reactive to light.  Cardiovascular: Normal rate, regular rhythm and normal heart sounds.   Pulmonary/Chest: Effort normal. No respiratory distress.  Abdominal: He exhibits no distension.  Mild diffuse lower abdominal tenderness  Genitourinary:  Rectal exam   Neurological: He is alert and oriented to person, place, and time. No cranial nerve deficit.  Psychiatric: He has a normal mood and affect.    ED Course  Procedures (including critical care time) Labs Review Labs Reviewed  BASIC METABOLIC PANEL - Abnormal; Notable for the following:    Glucose, Bld 116 (*)    BUN 22 (*)    All other components within normal limits  CBC - Abnormal; Notable for the following:    RBC 3.42 (*)    Hemoglobin 10.4 (*)    HCT 33.2 (*)    Platelets 135 (*)    All other components within normal limits  Randolm Idol, ED    Imaging Review Dg Chest 2 View  04/12/2015  CLINICAL DATA:  Chest pain.  Shortness of breath.  Smoker. EXAM: CHEST  2 VIEW COMPARISON:  03/05/2015 chest radiograph FINDINGS: Dual lead right subclavian pacemaker is stable in configuration with lead tips overlying the right atrium and right ventricle. Stable cardiomediastinal silhouette with atherosclerotic aortic arch and mild cardiomegaly. No pneumothorax. No pleural effusion. Clear lungs, with no focal lung consolidation and no pulmonary edema. IMPRESSION: 1. No active pulmonary disease. 2. Stable mild cardiomegaly without pulmonary edema. Electronically Signed   By: Ilona Sorrel M.D.   On: 04/12/2015 21:28   I have personally reviewed and evaluated these images and  lab results as part of my medical decision-making.   EKG Interpretation   Date/Time:  Wednesday April 12 2015 19:25:56 EDT Ventricular Rate:  90 PR Interval:    QRS Duration: 102 QT Interval:  368 QTC Calculation: 450 R Axis:   93 Text Interpretation:  Atrial fibrillation Rightward axis Abnormal ECG No  significant change since last tracing Confirmed by NGUYEN, EMILY ([id number])  on 04/12/2015 7:44:17 PM     Chart review demonstrates 5 visits in 48 hours. Tonight patient's similar vital signs to multiple prior visits, similar findings.  Patient requests a place to stay overnight.  MDM   Final diagnoses:  Atypical chest pain  Patient with multiple ED visits now presents with concern of chest pain.  Nausea, no  evidence for ACS, pulmonary medicine, pneumonia. Patient had labs, from triage. Patient's findings are similar to those on multiple prior evaluations, including those within the past 48 hours. With no evidence for acute new condition, the patient was appropriate for discharge with outpatient follow-up.   Carmin Muskrat, MD 04/12/15 2233

## 2015-04-12 NOTE — Discharge Instructions (Signed)
You need to follow-up with your GI surgeon for your rectal polyps and rectal bleeding.  You should also contact your primary care doctor (Dr. Fredderick Phenix) to follow-up with your abdominal pain.  Recommend if you stop smoking and drinking alcohol.   Colon Polyps Polyps are lumps of extra tissue growing inside the body. Polyps can grow in the large intestine (colon). Most colon polyps are noncancerous (benign). However, some colon polyps can become cancerous over time. Polyps that are larger than a pea may be harmful. To be safe, caregivers remove and test all polyps. CAUSES  Polyps form when mutations in the genes cause your cells to grow and divide even though no more tissue is needed. RISK FACTORS There are a number of risk factors that can increase your chances of getting colon polyps. They include:  Being older than 50 years.  Family history of colon polyps or colon cancer.  Long-term colon diseases, such as colitis or Crohn disease.  Being overweight.  Smoking.  Being inactive.  Drinking too much alcohol. SYMPTOMS  Most small polyps do not cause symptoms. If symptoms are present, they may include:  Blood in the stool. The stool may look dark red or black.  Constipation or diarrhea that lasts longer than 1 week. DIAGNOSIS People often do not know they have polyps until their caregiver finds them during a regular checkup. Your caregiver can use 4 tests to check for polyps:  Digital rectal exam. The caregiver wears gloves and feels inside the rectum. This test would find polyps only in the rectum.  Barium enema. The caregiver puts a liquid called barium into your rectum before taking X-rays of your colon. Barium makes your colon look white. Polyps are dark, so they are easy to see in the X-ray pictures.  Sigmoidoscopy. A thin, flexible tube (sigmoidoscope) is placed into your rectum. The sigmoidoscope has a light and tiny camera in it. The caregiver uses the sigmoidoscope to look at the  last third of your colon.  Colonoscopy. This test is like sigmoidoscopy, but the caregiver looks at the entire colon. This is the most common method for finding and removing polyps. TREATMENT  Any polyps will be removed during a sigmoidoscopy or colonoscopy. The polyps are then tested for cancer. PREVENTION  To help lower your risk of getting more colon polyps:  Eat plenty of fruits and vegetables. Avoid eating fatty foods.  Do not smoke.  Avoid drinking alcohol.  Exercise every day.  Lose weight if recommended by your caregiver.  Eat plenty of calcium and folate. Foods that are rich in calcium include milk, cheese, and broccoli. Foods that are rich in folate include chickpeas, kidney beans, and spinach. HOME CARE INSTRUCTIONS Keep all follow-up appointments as directed by your caregiver. You may need periodic exams to check for polyps. SEEK MEDICAL CARE IF: You notice bleeding during a bowel movement.   This information is not intended to replace advice given to you by your health care provider. Make sure you discuss any questions you have with your health care provider.   Document Released: 02/21/2004 Document Revised: 06/17/2014 Document Reviewed: 08/06/2011 Elsevier Interactive Patient Education [employer].

## 2015-04-12 NOTE — ED Notes (Signed)
THe police department called EMS from a nursing home.  The patient was there acting eratically and he is not a resident at the facility.  He advised them he was having chest pain and EMS brought him to the ED.  The patient advised EMS he was having central chest pain but he was smoking when EMS arrived.  EMS said the patient denies any drug use.  EMS was unable to get an IV in him.

## 2015-04-12 NOTE — ED Provider Notes (Signed)
CSN: [id number]     Arrival date & time 04/12/15  3846 History   First MD Initiated Contact with Patient 04/12/15 [phone]     Chief Complaint  Patient presents with  . Abdominal Pain  . Gout    (Consider location/radiation/quality/duration/timing/severity/associated sxs/prior Treatment) HPI Comments: He comes in today for evaluation of lower abdominal pain and rectal bleeding.  He reports 4 episodes of bloody bowel movements since 8:00 this morning, reports being seen in ED yesterday for the same complaints and discharged follow with surgery as outpatient.  He is a poor historian and changes his story multiple times.  He reports history of colectomy several months ago, and says he is scheduled for surgery today, but is unable to say the name of the surgeon or when/where the surgery is to performed.  He denies chest pain or previous heart attacks.  Has atrial fibrillation but is not on anticoagulation due to previous GI bleeds.  Reports alcohol use; says he drank a "40" this morning at 7:00.  He reports some shortness breath, but says this is no worse than usual. Smokes 1/2 ppd. Denies dizziness or weakness.   Patient is a 69 y.o. male presenting with abdominal pain. The history is provided by the patient.  Abdominal Pain Pain location:  Suprapubic Pain quality: aching and sharp   Pain radiates to:  Does not radiate Pain severity:  Moderate Onset quality:  Unable to specify Timing:  Intermittent Progression:  Unable to specify Chronicity:  Chronic Context: alcohol use and previous surgery   Context: not diet changes, not eating, not recent illness, not retching, not sick contacts and not trauma   Relieved by:  Nothing Worsened by:  Nothing tried Ineffective treatments:  None tried Associated symptoms: diarrhea, hematochezia and shortness of breath   Associated symptoms: no chest pain, no chills, no constipation, no cough, no dysuria, no fatigue, no fever, no hematemesis, no nausea and no  vomiting   Risk factors: aspirin and recent hospitalization     Past Medical History  Diagnosis Date  . Depression   . Hypomagnesemia   . Fall at nursing home 05/27/2013    "slipped on water; said I broke my right hip" (05/27/2013)  . Migraine     "used to have them bad; haven't had one in awhile" (05/27/2013)  . Gout attack     "not too long ago; in my right foot" (05/27/2013)  . Complete heart block ([place])     reason for pacemaker/notes 05/27/2013  . Atrial fibrillation ([place])     [name] 05/27/2013  . HLD (hyperlipidemia)   . GIB (gastrointestinal bleeding)   . BPH (benign prostatic hyperplasia)   . Presence of permanent cardiac pacemaker   . Hypertension   . Gastric ulcer   . Non Hodgkin's lymphoma ([place])   . Tubular adenoma of colon    Past Surgical History  Procedure Laterality Date  . Insert / replace / remove pacemaker    . Appendectomy    . Cholecystectomy    . Open reduction of hip Right 05/28/2013    Procedure: OPEN REDUCTION OF HIP;  Surgeon: Renette Butters, MD;  Location: [place];  Service: Orthopedics;  Laterality: Right;  . Esophagogastroduodenoscopy (egd) with propofol N/A 09/26/2014    Procedure: ESOPHAGOGASTRODUODENOSCOPY (EGD) WITH PROPOFOL;  Surgeon: Jerene Bears, MD;  Location: [facility] ENDOSCOPY;  Service: Endoscopy;  Laterality: N/A;  . Colonoscopy N/A 10/01/2014    Procedure: COLONOSCOPY;  Surgeon: Jerene Bears, MD;  Location: [facility]  ENDOSCOPY;  Service: Endoscopy;  Laterality: N/A;  . Partial colectomy N/A 10/05/2014    Procedure: SUBTOTAL COLECTOMY WITH ILEORECTAL ANASTOMOSIS;  Surgeon: Donnie Mesa, MD;  Location: [place];  Service: General;  Laterality: N/A;  . Flexible sigmoidoscopy N/A 11/07/2014    Procedure: FLEXIBLE SIGMOIDOSCOPY;  Surgeon: Jerene Bears, MD;  Location: [facility] ENDOSCOPY;  Service: Endoscopy;  Laterality: N/A;  . Gastric resection     Family History  Problem Relation Age of Onset  . Colon cancer Mother    Social History  Substance Use Topics   . Smoking status: Current Some Day Smoker -- 0.50 packs/day for 66 years    Types: Cigarettes  . Smokeless tobacco: Former Systems developer    Types: Snuff, Chew     Comment: 05/27/2013 "haven't used chew or snuff in ~ 30 yr"  . Alcohol Use: No     Comment: 05/27/2013 "none in years; never had problem w/it" - not in 30 years    Review of Systems  Constitutional: Negative for fever, chills, appetite change and fatigue.  Respiratory: Positive for shortness of breath and wheezing. Negative for cough, choking and chest tightness.   Cardiovascular: Negative for chest pain and palpitations.  Gastrointestinal: Positive for abdominal pain, diarrhea, blood in stool and hematochezia. Negative for nausea, vomiting, constipation and hematemesis.  Genitourinary: Negative for dysuria.  Musculoskeletal: Positive for arthralgias.  Neurological: Negative for dizziness, syncope, weakness, light-headedness and numbness.    Allergies  Review of patient's allergies indicates no known allergies.  Home Medications   Prior to Admission medications   Medication Sig Start Date End Date Taking? Authorizing Provider  acetaminophen (TYLENOL) 500 MG tablet Take 500 mg by mouth every 6 (six) hours as needed for mild pain.    Historical Provider, MD  aspirin 81 MG tablet Take 81 mg by mouth daily.    Historical Provider, MD  carvedilol (COREG) 6.25 MG tablet Take 1 tablet (6.25 mg total) by mouth 2 (two) times daily with a meal. Patient not taking: Reported on 04/11/2015 11/16/14   Velvet Bathe, MD   BP 139/72 mmHg  Pulse 73  Temp(Src) 98.9 F (37.2 C) (Oral)  Resp 16  SpO2 98% Physical Exam  Constitutional: He is oriented to person, place, and time. No distress.  Frail and Disheveled  Eyes: EOM are normal. Pupils are equal, round, and reactive to light.  Cardiovascular: Normal rate, regular rhythm and normal heart sounds.   No murmur heard. Pulmonary/Chest: Effort normal. No respiratory distress. He has wheezes. He  has no rales.  Abdominal: Soft. He exhibits no distension. There is no rebound and no guarding.  Mild diffuse lower abdominal tenderness  Genitourinary:  Rectal exam   Neurological: He is alert and oriented to person, place, and time. No cranial nerve deficit.  Skin: Skin is warm. No rash noted.    ED Course  Procedures (including critical care time) Labs Review Labs Reviewed  I-STAT CHEM 8, ED  POC OCCULT BLOOD, ED    Imaging Review No results found. I have personally reviewed and evaluated these images and lab results as part of my medical decision-making.   EKG Interpretation None      MDM   Final diagnoses:  None   Patient returns with complaints of abdominal pain and rectal bleeding after being discharged from ED 3 times yesterday with similar complaints.  Denies chest pain, dizziness.  He reports shortness of breath is at baseline.  He has a history of atrial fibrillation and not on  anticoagulation due to GI bleeding.  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 Had colonoscopy 11/08/14 that showed friable polyp is likely source of his bleeding and he was advised to follow-up with surgery.  [place] surgery reports he has not followed with them since August 2016.  His hemoglobin is stable from yesterday and rectal exam revealed no melena or hematochezia.  Vital signs were stable.  He was able to ambulate without difficulty.  He was discharged to follow-up with [place] surgery and his PCP.     Olam Idler, MD 04/12/15 [place], MD 04/12/15 1620

## 2015-04-13 ENCOUNTER — Encounter (HOSPITAL_COMMUNITY): Payer: Self-pay | Admitting: Emergency Medicine

## 2015-04-13 ENCOUNTER — Emergency Department (HOSPITAL_COMMUNITY)
Admission: EM | Admit: 2015-04-13 | Discharge: 2015-04-14 | Disposition: A | Discharge status: Home / Self Care | Payer: Medicare Other | Source: Home / Self Care | Attending: Emergency Medicine | Admitting: Emergency Medicine

## 2015-04-13 ENCOUNTER — Other Ambulatory Visit: Payer: Self-pay

## 2015-04-13 ENCOUNTER — Emergency Department (HOSPITAL_COMMUNITY): Payer: Medicare Other

## 2015-04-13 ENCOUNTER — Emergency Department (HOSPITAL_COMMUNITY)
Admission: EM | Admit: 2015-04-13 | Discharge: 2015-04-13 | Disposition: A | Discharge status: Home / Self Care | Payer: Medicare Other | Source: Home / Self Care | Attending: Emergency Medicine | Admitting: Emergency Medicine

## 2015-04-13 DIAGNOSIS — R0789 Other chest pain: Secondary | ICD-10-CM | POA: Diagnosis not present

## 2015-04-13 DIAGNOSIS — M25552 Pain in left hip: Secondary | ICD-10-CM

## 2015-04-13 DIAGNOSIS — R079 Chest pain, unspecified: Secondary | ICD-10-CM

## 2015-04-13 DIAGNOSIS — G8929 Other chronic pain: Secondary | ICD-10-CM

## 2015-04-13 LAB — COMPREHENSIVE METABOLIC PANEL
ALBUMIN: 3.4 g/dL — AB (ref 3.5–5.0)
ALT: 15 U/L — AB (ref 17–63)
AST: 18 U/L (ref 15–41)
Alkaline Phosphatase: 97 U/L (ref 38–126)
Anion gap: 7 (ref 5–15)
BUN: 15 mg/dL (ref 6–20)
CHLORIDE: 109 mmol/L (ref 101–111)
CO2: 23 mmol/L (ref 22–32)
Calcium: 8.7 mg/dL — ABNORMAL LOW (ref 8.9–10.3)
Creatinine, Ser: 0.53 mg/dL — ABNORMAL LOW (ref 0.61–1.24)
GFR calc Af Amer: >60 mL/min (ref >60)
GFR calc non Af Amer: >60 mL/min (ref >60)
GLUCOSE: 120 mg/dL — AB (ref 65–99)
POTASSIUM: 3.9 mmol/L (ref 3.5–5.1)
SODIUM: 139 mmol/L (ref 135–145)
Total Bilirubin: 0.3 mg/dL (ref 0.3–1.2)
Total Protein: 7 g/dL (ref 6.5–8.1)

## 2015-04-13 LAB — POC OCCULT BLOOD, ED: FECAL OCCULT BLD: NEGATIVE

## 2015-04-13 LAB — CBC WITH DIFFERENTIAL/PLATELET
BASOS PCT: 0 %
Basophils Absolute: 0 10*3/uL (ref 0.0–0.1)
EOS ABS: 0.1 10*3/uL (ref 0.0–0.7)
Eosinophils Relative: 2 %
HCT: 33.1 % — ABNORMAL LOW (ref 39.0–52.0)
HEMOGLOBIN: 10.1 g/dL — AB (ref 13.0–17.0)
Lymphocytes Relative: 29 %
Lymphs Abs: 1.7 10*3/uL (ref 0.7–4.0)
MCH: 29.5 pg (ref 26.0–34.0)
MCHC: 30.5 g/dL (ref 30.0–36.0)
MCV: 96.8 fL (ref 78.0–100.0)
Monocytes Absolute: 0.4 10*3/uL (ref 0.1–1.0)
Monocytes Relative: 7 %
NEUTROS PCT: 62 %
Neutro Abs: 3.7 10*3/uL (ref 1.7–7.7)
PLATELETS: 122 10*3/uL — AB (ref 150–400)
RBC: 3.42 MIL/uL — AB (ref 4.22–5.81)
RDW: 15.6 % — AB (ref 11.5–15.5)
WBC: 5.9 10*3/uL (ref 4.0–10.5)

## 2015-04-13 LAB — URINALYSIS, ROUTINE W REFLEX MICROSCOPIC
BILIRUBIN URINE: NEGATIVE
Glucose, UA: NEGATIVE mg/dL
Hgb urine dipstick: NEGATIVE
Ketones, ur: NEGATIVE mg/dL
LEUKOCYTES UA: NEGATIVE
NITRITE: NEGATIVE
Protein, ur: NEGATIVE mg/dL
SPECIFIC GRAVITY, URINE: 1.011 (ref 1.005–1.030)
UROBILINOGEN UA: 0.2 mg/dL (ref 0.0–1.0)
pH: 5 (ref 5.0–8.0)

## 2015-04-13 LAB — TROPONIN I

## 2015-04-13 LAB — BRAIN NATRIURETIC PEPTIDE: B Natriuretic Peptide: 193.3 pg/mL — ABNORMAL HIGH (ref 0.0–100.0)

## 2015-04-13 LAB — D-DIMER, QUANTITATIVE (NOT AT ARMC): D DIMER QUANT: 2.24 ug{FEU}/mL — AB (ref 0.00–0.48)

## 2015-04-13 MED ORDER — IPRATROPIUM-ALBUTEROL 0.5-2.5 (3) MG/3ML IN SOLN
3.0000 mL | Freq: Once | RESPIRATORY_TRACT | Status: AC
  Administered 2015-04-13: 3 mL via RESPIRATORY_TRACT
  Filled 2015-04-13: qty 3

## 2015-04-13 MED ORDER — IOHEXOL 350 MG/ML SOLN
100.0000 mL | Freq: Once | INTRAVENOUS | Status: AC | PRN
  Administered 2015-04-13: 100 mL via INTRAVENOUS

## 2015-04-13 NOTE — ED Provider Notes (Signed)
CSN: [id number]     Arrival date & time 04/13/15  1829 History  By signing my name below, I, Irene Pap, attest that this documentation has been prepared under the direction and in the presence of Montine Circle, PA-C. Electronically Signed: Irene Pap, ED Scribe. 04/13/2015. 7:20 PM.   Chief Complaint  Patient presents with  . Hip Pain   The history is provided by the patient. No language interpreter was used.  HPI Comments: [name] is a 69 y.o. Male with a hx of fall, frequent ED visits, complete heart block, AFib, and non-Hodgkin's lymphoma brought in by EMS who presents to the Emergency Department complaining of left hip pain onset one hour ago. Per EMS, pt tripped and fell on the sidewalk, hurting his left hip. He denies hitting head, LOC, joint swelling, numbness, weakness, rash, or wound.  Past Medical History  Diagnosis Date  . Depression   . Hypomagnesemia   . Fall at nursing home 05/27/2013    "slipped on water; said I broke my right hip" (05/27/2013)  . Migraine     "used to have them bad; haven't had one in awhile" (05/27/2013)  . Gout attack     "not too long ago; in my right foot" (05/27/2013)  . Complete heart block ([place])     reason for pacemaker/notes 05/27/2013  . Atrial fibrillation ([place])     [name] 05/27/2013  . HLD (hyperlipidemia)   . GIB (gastrointestinal bleeding)   . BPH (benign prostatic hyperplasia)   . Presence of permanent cardiac pacemaker   . Hypertension   . Gastric ulcer   . Non Hodgkin's lymphoma ([place])   . Tubular adenoma of colon    Past Surgical History  Procedure Laterality Date  . Insert / replace / remove pacemaker    . Appendectomy    . Cholecystectomy    . Open reduction of hip Right 05/28/2013    Procedure: OPEN REDUCTION OF HIP;  Surgeon: Renette Butters, MD;  Location: [place];  Service: Orthopedics;  Laterality: Right;  . Esophagogastroduodenoscopy (egd) with propofol N/A 09/26/2014    Procedure:  ESOPHAGOGASTRODUODENOSCOPY (EGD) WITH PROPOFOL;  Surgeon: Jerene Bears, MD;  Location: [facility] ENDOSCOPY;  Service: Endoscopy;  Laterality: N/A;  . Colonoscopy N/A 10/01/2014    Procedure: COLONOSCOPY;  Surgeon: Jerene Bears, MD;  Location: [facility] ENDOSCOPY;  Service: Endoscopy;  Laterality: N/A;  . Partial colectomy N/A 10/05/2014    Procedure: SUBTOTAL COLECTOMY WITH ILEORECTAL ANASTOMOSIS;  Surgeon: Donnie Mesa, MD;  Location: [place];  Service: General;  Laterality: N/A;  . Flexible sigmoidoscopy N/A 11/07/2014    Procedure: FLEXIBLE SIGMOIDOSCOPY;  Surgeon: Jerene Bears, MD;  Location: [facility] ENDOSCOPY;  Service: Endoscopy;  Laterality: N/A;  . Gastric resection     Family History  Problem Relation Age of Onset  . Colon cancer Mother    Social History  Substance Use Topics  . Smoking status: Current Some Day Smoker -- 0.50 packs/day for 66 years    Types: Cigarettes  . Smokeless tobacco: Former Systems developer    Types: Snuff, Chew     Comment: 05/27/2013 "haven't used chew or snuff in ~ 30 yr"  . Alcohol Use: No     Comment: 05/27/2013 "none in years; never had problem w/it" - not in 30 years    Review of Systems  Musculoskeletal: Positive for arthralgias. Negative for joint swelling.  Skin: Negative for rash and wound.  Neurological: Negative for syncope, weakness and numbness.   Allergies  Review of patient's allergies indicates no known allergies.  Home Medications   Prior to Admission medications   Medication Sig Start Date End Date Taking? Authorizing Provider  carvedilol (COREG) 6.25 MG tablet Take 1 tablet (6.25 mg total) by mouth 2 (two) times daily with a meal. Patient not taking: Reported on 04/11/2015 11/16/14   Velvet Bathe, MD   BP 127/69 mmHg  Pulse 86  Temp(Src) 98.5 F (36.9 C) (Oral)  Resp 20  SpO2 100% Physical Exam  Constitutional: He is oriented to person, place, and time. He appears well-developed and well-nourished.  HENT:  Head: Normocephalic and atraumatic.  Eyes:  Conjunctivae and EOM are normal.  Neck: Normal range of motion. Neck supple.  Cardiovascular: Normal rate.   Pulmonary/Chest: Effort normal.  Abdominal: He exhibits no distension.  Musculoskeletal: Normal range of motion.  ROM and strength 5/5 left hip, ambulatory  Neurological: He is alert and oriented to person, place, and time.  Skin: Skin is warm and dry.  Psychiatric: He has a normal mood and affect. His behavior is normal. Judgment and thought content normal.  Nursing note and vitals reviewed.   ED Course  Procedures (including critical care time) DIAGNOSTIC STUDIES: Oxygen Saturation is 100% on RA, normal by my interpretation.    COORDINATION OF CARE: 7:20 PM-Discussed treatment plan with pt at bedside and pt agreed to plan.    Labs Review Labs Reviewed - No data to display  Imaging Review Dg Chest 2 View  04/13/2015  CLINICAL DATA:  Two week history of productive cough EXAM: CHEST  2 VIEW COMPARISON:  April 12, 2015 FINDINGS: There is no edema or consolidation. Heart size and pulmonary vascularity are within normal limits. Pacemaker leads are attached to the right atrium and right ventricle. No adenopathy. No pneumothorax. No bone lesions. IMPRESSION: No edema or consolidation. Electronically Signed   By: Lowella Grip III M.D.   On: 04/13/2015 10:01   Dg Chest 2 View  04/12/2015  CLINICAL DATA:  Chest pain.  Shortness of breath.  Smoker. EXAM: CHEST  2 VIEW COMPARISON:  03/05/2015 chest radiograph FINDINGS: Dual lead right subclavian pacemaker is stable in configuration with lead tips overlying the right atrium and right ventricle. Stable cardiomediastinal silhouette with atherosclerotic aortic arch and mild cardiomegaly. No pneumothorax. No pleural effusion. Clear lungs, with no focal lung consolidation and no pulmonary edema. IMPRESSION: 1. No active pulmonary disease. 2. Stable mild cardiomegaly without pulmonary edema. Electronically Signed   By: Ilona Sorrel M.D.    On: 04/12/2015 21:28   Ct Angio Chest Pe W/cm &/or Wo Cm  04/13/2015  CLINICAL DATA:  Chest pain and short of breath since yesterday EXAM: CT ANGIOGRAPHY CHEST WITH CONTRAST TECHNIQUE: Multidetector CT imaging of the chest was performed using the standard protocol during bolus administration of intravenous contrast. Multiplanar CT image reconstructions and MIPs were obtained to evaluate the vascular anatomy. CONTRAST:  131mL OMNIPAQUE IOHEXOL 350 MG/ML SOLN COMPARISON:  11/23/2014 FINDINGS: There are no filling defects in the pulmonary arterial tree to suggest acute pulmonary thromboembolism. Dual lead left subclavian pacemaker and leads are in the right atrium and right ventricle. No abnormal mediastinal adenopathy by measurement criteria. No obvious aortic dissection. Atherosclerotic changes of the aortic arch, proximal left subclavian artery, and coronary arteries are noted. No pneumothorax or pleural effusion Clear lungs. Stable right adrenal nodule. Atherosclerotic calcifications involving the abdominal aorta are noted. There are scattered pancreatic calcifications. No acute bony deformity. Review of the MIP images confirms the above  findings. IMPRESSION: No evidence of acute pulmonary thromboembolism. Electronically Signed   By: Marybelle Killings M.D.   On: 04/13/2015 14:22   Dg Hip Unilat With Pelvis 2-3 Views Left  04/13/2015  CLINICAL DATA:  69 year old male with left hip pain after falling on his left hip when his late gave out while walking down the road earlier today EXAM: DG HIP (WITH OR WITHOUT PELVIS) 2-3V LEFT COMPARISON:  Prior radiographs of the pelvis and left hip 11/06/2014 FINDINGS: Surgical changes of prior repair of a right femoral fracture with intra medullary nail and transfemoral neck lag screw. No evidence of acute fracture or malalignment involving the left femur. The bony pelvis is intact. The bladder is partially opacified by excreted contrast material. Aortoiliac atherosclerotic  calcifications noted. Normal bony mineralization. No lytic or blastic osseous lesion. IMPRESSION: No evidence of acute fracture or malalignment. Electronically Signed   By: Jacqulynn Cadet M.D.   On: 04/13/2015 19:10   I have personally reviewed and evaluated these images and lab results as part of my medical decision-making.   EKG Interpretation None      MDM   Final diagnoses:  Hip pain, left    Patient with left hip pain following a fall. Plain films are negative. Patient is ambulatory in the emergency department. He states for discharge. Of note, seen earlier today.  I personally performed the services described in this documentation, which was scribed in my presence. The recorded information has been reviewed and is accurate.     Montine Circle, PA-C 04/13/15 1923  Fredia Sorrow, MD 04/16/15 [phone]

## 2015-04-13 NOTE — Discharge Instructions (Signed)
Nonspecific Chest Pain  °There is no evidence of heart attack or blood clot in the lung. Follow up with your doctor. Return to the ED if you develop new or worsening symptoms. °Chest pain can be caused by many different conditions. There is always a chance that your pain could be related to something serious, such as a heart attack or a blood clot in your lungs. Chest pain can also be caused by conditions that are not life-threatening. If you have chest pain, it is very important to follow up with your health care provider. °CAUSES  °Chest pain can be caused by: °· Heartburn. °· Pneumonia or bronchitis. °· Anxiety or stress. °· Inflammation around your heart (pericarditis) or lung (pleuritis or pleurisy). °· A blood clot in your lung. °· A collapsed lung (pneumothorax). It can develop suddenly on its own (spontaneous pneumothorax) or from trauma to the chest. °· Shingles infection (varicella-zoster virus). °· Heart attack. °· Damage to the bones, muscles, and cartilage that make up your chest wall. This can include: °¨ Bruised bones due to injury. °¨ Strained muscles or cartilage due to frequent or repeated coughing or overwork. °¨ Fracture to one or more ribs. °¨ Sore cartilage due to inflammation (costochondritis). °RISK FACTORS  °Risk factors for chest pain may include: °· Activities that increase your risk for trauma or injury to your chest. °· Respiratory infections or conditions that cause frequent coughing. °· Medical conditions or overeating that can cause heartburn. °· Heart disease or family history of heart disease. °· Conditions or health behaviors that increase your risk of developing a blood clot. °· Having had chicken pox (varicella zoster). °SIGNS AND SYMPTOMS °Chest pain can feel like: °· Burning or tingling on the surface of your chest or deep in your chest. °· Crushing, pressure, aching, or squeezing pain. °· Dull or sharp pain that is worse when you move, cough, or take a deep breath. °· Pain  that is also felt in your back, neck, shoulder, or arm, or pain that spreads to any of these areas. °Your chest pain may come and go, or it may stay constant. °DIAGNOSIS °Lab tests or other studies may be needed to find the cause of your pain. Your health care provider may have you take a test called an ambulatory ECG (electrocardiogram). An ECG records your heartbeat patterns at the time the test is performed. You may also have other tests, such as: °· Transthoracic echocardiogram (TTE). During echocardiography, sound waves are used to create a picture of all of the heart structures and to look at how blood flows through your heart. °· Transesophageal echocardiogram (TEE). This is a more advanced imaging test that obtains images from inside your body. It allows your health care provider to see your heart in finer detail. °· Cardiac monitoring. This allows your health care provider to monitor your heart rate and rhythm in real time. °· Holter monitor. This is a portable device that records your heartbeat and can help to diagnose abnormal heartbeats. It allows your health care provider to track your heart activity for several days, if needed. °· Stress tests. These can be done through exercise or by taking medicine that makes your heart beat more quickly. °· Blood tests. °· Imaging tests. °TREATMENT  °Your treatment depends on what is causing your chest pain. Treatment may include: °· Medicines. These may include: °¨ Acid blockers for heartburn. °¨ Anti-inflammatory medicine. °¨ Pain medicine for inflammatory conditions. °¨ Antibiotic medicine, if an infection is present. °¨ Medicines   to dissolve blood clots. °¨ Medicines to treat coronary artery disease. °· Supportive care for conditions that do not require medicines. This may include: °¨ Resting. °¨ Applying heat or cold packs to injured areas. °¨ Limiting activities until pain decreases. °HOME CARE INSTRUCTIONS °· If you were prescribed an antibiotic medicine,  finish it all even if you start to feel better. °· Avoid any activities that bring on chest pain. °· Do not use any tobacco products, including cigarettes, chewing tobacco, or electronic cigarettes. If you need help quitting, ask your health care provider. °· Do not drink alcohol. °· Take medicines only as directed by your health care provider. °· Keep all follow-up visits as directed by your health care provider. This is important. This includes any further testing if your chest pain does not go away. °· If heartburn is the cause for your chest pain, you may be told to keep your head raised (elevated) while sleeping. This reduces the chance that acid will go from your stomach into your esophagus. °· Make lifestyle changes as directed by your health care provider. These may include: °¨ Getting regular exercise. Ask your health care provider to suggest some activities that are safe for you. °¨ Eating a heart-healthy diet. A registered dietitian can help you to learn healthy eating options. °¨ Maintaining a healthy weight. °¨ Managing diabetes, if necessary. °¨ Reducing stress. °SEEK MEDICAL CARE IF: °· Your chest pain does not go away after treatment. °· You have a rash with blisters on your chest. °· You have a fever. °SEEK IMMEDIATE MEDICAL CARE IF:  °· Your chest pain is worse. °· You have an increasing cough, or you cough up blood. °· You have severe abdominal pain. °· You have severe weakness. °· You faint. °· You have chills. °· You have sudden, unexplained chest discomfort. °· You have sudden, unexplained discomfort in your arms, back, neck, or jaw. °· You have shortness of breath at any time. °· You suddenly start to sweat, or your skin gets clammy. °· You feel nauseous or you vomit. °· You suddenly feel light-headed or dizzy. °· Your heart begins to beat quickly, or it feels like it is skipping beats. °These symptoms may represent a serious problem that is an emergency. Do not wait to see if the symptoms  will go away. Get medical help right away. Call your local emergency services (911 in the [place] Do not drive yourself to the hospital. °  °This information is not intended to replace advice given to you by your health care provider. Make sure you discuss any questions you have with your health care provider. °  °Document Released: 03/06/2005 Document Revised: 06/17/2014 Document Reviewed: 12/31/2013 °Elsevier Interactive Patient Education ©2016 [employer] ° °

## 2015-04-13 NOTE — ED Notes (Signed)
Pt from home via [facility] with c/o chest pain starting after coughing this morning.  Pt reports sharp left chest pain radiating to left shoulder.  Hx of afib and defibrillator.  Lung diminished left and wheezing right, given 5 mg Albuterol.  Given 324 mg aspirin and 1 nitro with pressure decrease from 140/68 to 91/55, given 500 mL NS.  Pt in NAD, A&O.

## 2015-04-13 NOTE — ED Provider Notes (Signed)
CSN: [id number]     Arrival date & time 04/13/15  2331 History  By signing my name below, I, [name], attest that this documentation has been prepared under the direction and in the presence of Orpah Greek, MD. Electronically Signed: Erling Goodwin, ED Scribe. 04/14/2015. 2:07 AM.     Chief Complaint  Patient presents with  . Chest Pain     The history is provided by the patient. No language interpreter was used.    HPI Comments: [name] is a 69 y.o. male with a h/o complete heart block, atrial fibrillation, and non hodgkin's lymphoma who presents to the Emergency Department complaining of sudden onset 10/10, left sided chest pain that occurred 2 hours ago while he was sitting. He reports mild associated nausea. Pt notes he has a pacemaker in place.  Pt is a smoker. He denies any h/o MI or stroke. Per EMS pt has a h/o GI ulcers and had a recent GI bleed so he is unable to take aspirin. Per Chart Review, this is patient's 3rd visit in the past 24 hours, pt was seen at [facility] yesterday for chest pain workup, and hip pain at [facility] today. Pt had a chest CT done yesterday which came back negative. He denies any fever, vomiting or other complaints  Past Medical History  Diagnosis Date  . Depression   . Hypomagnesemia   . Fall at nursing home 05/27/2013    "slipped on water; said I broke my right hip" (05/27/2013)  . Migraine     "used to have them bad; haven't had one in awhile" (05/27/2013)  . Gout attack     "not too long ago; in my right foot" (05/27/2013)  . Complete heart block ([place])     reason for pacemaker/notes 05/27/2013  . Atrial fibrillation ([place])     [name] 05/27/2013  . HLD (hyperlipidemia)   . GIB (gastrointestinal bleeding)   . BPH (benign prostatic hyperplasia)   . Presence of permanent cardiac pacemaker   . Hypertension   . Gastric ulcer   . Non Hodgkin's lymphoma ([place])   . Tubular adenoma of colon    Past Surgical History   Procedure Laterality Date  . Insert / replace / remove pacemaker    . Appendectomy    . Cholecystectomy    . Open reduction of hip Right 05/28/2013    Procedure: OPEN REDUCTION OF HIP;  Surgeon: Renette Butters, MD;  Location: [place];  Service: Orthopedics;  Laterality: Right;  . Esophagogastroduodenoscopy (egd) with propofol N/A 09/26/2014    Procedure: ESOPHAGOGASTRODUODENOSCOPY (EGD) WITH PROPOFOL;  Surgeon: Jerene Bears, MD;  Location: [facility] ENDOSCOPY;  Service: Endoscopy;  Laterality: N/A;  . Colonoscopy N/A 10/01/2014    Procedure: COLONOSCOPY;  Surgeon: Jerene Bears, MD;  Location: [facility] ENDOSCOPY;  Service: Endoscopy;  Laterality: N/A;  . Partial colectomy N/A 10/05/2014    Procedure: SUBTOTAL COLECTOMY WITH ILEORECTAL ANASTOMOSIS;  Surgeon: Donnie Mesa, MD;  Location: [place];  Service: General;  Laterality: N/A;  . Flexible sigmoidoscopy N/A 11/07/2014    Procedure: FLEXIBLE SIGMOIDOSCOPY;  Surgeon: Jerene Bears, MD;  Location: [facility] ENDOSCOPY;  Service: Endoscopy;  Laterality: N/A;  . Gastric resection     Family History  Problem Relation Age of Onset  . Colon cancer Mother    Social History  Substance Use Topics  . Smoking status: Current Some Day Smoker -- 0.50 packs/day for 66 years    Types: Cigarettes  . Smokeless  tobacco: Former Systems developer    Types: Snuff, Chew     Comment: 05/27/2013 "haven't used chew or snuff in ~ 30 yr"  . Alcohol Use: No     Comment: 05/27/2013 "none in years; never had problem w/it" - not in 30 years    Review of Systems  Constitutional: Negative for fever.  Cardiovascular: Positive for chest pain.  Gastrointestinal: Positive for nausea. Negative for vomiting.  All other systems reviewed and are negative.     Allergies  Review of patient's allergies indicates no known allergies.  Home Medications   Prior to Admission medications   Medication Sig Start Date End Date Taking? Authorizing Provider  carvedilol (COREG) 6.25 MG tablet Take 1 tablet (6.25  mg total) by mouth 2 (two) times daily with a meal. Patient not taking: Reported on 04/11/2015 11/16/14   Velvet Bathe, MD   BP 141/79 mmHg  Pulse 100  Resp 20  SpO2 100% Physical Exam  Constitutional: He is oriented to person, place, and time. He appears well-developed and well-nourished. No distress.  HENT:  Head: Normocephalic and atraumatic.  Right Ear: Hearing normal.  Left Ear: Hearing normal.  Nose: Nose normal.  Mouth/Throat: Oropharynx is clear and moist and mucous membranes are normal.  Eyes: Conjunctivae and EOM are normal. Pupils are equal, round, and reactive to light.  Neck: Normal range of motion. Neck supple.  Cardiovascular: Regular rhythm, S1 normal and S2 normal.  Exam reveals no gallop and no friction rub.   No murmur heard. Pulmonary/Chest: Effort normal. No respiratory distress. He has wheezes (bilateral). He exhibits no tenderness.  Abdominal: Soft. Normal appearance and bowel sounds are normal. There is no hepatosplenomegaly. There is no tenderness. There is no rebound, no guarding, no tenderness at McBurney's point and negative Murphy's sign. No hernia.  Musculoskeletal: Normal range of motion.  Neurological: He is alert and oriented to person, place, and time. He has normal strength. No cranial nerve deficit or sensory deficit. Coordination normal. GCS eye subscore is 4. GCS verbal subscore is 5. GCS motor subscore is 6.  Skin: Skin is warm, dry and intact. No rash noted. No cyanosis.  Psychiatric: He has a normal mood and affect. His speech is normal and behavior is normal. Thought content normal.  Nursing note and vitals reviewed.   ED Course  Procedures (including critical care time)  DIAGNOSTIC STUDIES: Oxygen Saturation is 100% on RA, normal by my interpretation.    COORDINATION OF CARE: 11:39 PM- Will order EKG, CBC w/diff, BMP, and troponin.  Pt advised of plan for treatment and pt agrees.   Labs Review Labs Reviewed  CBC WITH  DIFFERENTIAL/PLATELET - Abnormal; Notable for the following:    RBC 3.09 (*)    Hemoglobin 9.5 (*)    HCT 29.9 (*)    RDW 15.9 (*)    Platelets 127 (*)    All other components within normal limits  BASIC METABOLIC PANEL - Abnormal; Notable for the following:    CO2 21 (*)    Glucose, Bld 105 (*)    All other components within normal limits  TROPONIN I    Imaging Review Dg Chest 2 View  04/13/2015  CLINICAL DATA:  Two week history of productive cough EXAM: CHEST  2 VIEW COMPARISON:  April 12, 2015 FINDINGS: There is no edema or consolidation. Heart size and pulmonary vascularity are within normal limits. Pacemaker leads are attached to the right atrium and right ventricle. No adenopathy. No pneumothorax. No bone lesions. IMPRESSION:  No edema or consolidation. Electronically Signed   By: Lowella Grip III M.D.   On: 04/13/2015 10:01   Dg Chest 2 View  04/12/2015  CLINICAL DATA:  Chest pain.  Shortness of breath.  Smoker. EXAM: CHEST  2 VIEW COMPARISON:  03/05/2015 chest radiograph FINDINGS: Dual lead right subclavian pacemaker is stable in configuration with lead tips overlying the right atrium and right ventricle. Stable cardiomediastinal silhouette with atherosclerotic aortic arch and mild cardiomegaly. No pneumothorax. No pleural effusion. Clear lungs, with no focal lung consolidation and no pulmonary edema. IMPRESSION: 1. No active pulmonary disease. 2. Stable mild cardiomegaly without pulmonary edema. Electronically Signed   By: Ilona Sorrel M.D.   On: 04/12/2015 21:28   Ct Angio Chest Pe W/cm &/or Wo Cm  04/13/2015  CLINICAL DATA:  Chest pain and short of breath since yesterday EXAM: CT ANGIOGRAPHY CHEST WITH CONTRAST TECHNIQUE: Multidetector CT imaging of the chest was performed using the standard protocol during bolus administration of intravenous contrast. Multiplanar CT image reconstructions and MIPs were obtained to evaluate the vascular anatomy. CONTRAST:  156mL OMNIPAQUE  IOHEXOL 350 MG/ML SOLN COMPARISON:  11/23/2014 FINDINGS: There are no filling defects in the pulmonary arterial tree to suggest acute pulmonary thromboembolism. Dual lead left subclavian pacemaker and leads are in the right atrium and right ventricle. No abnormal mediastinal adenopathy by measurement criteria. No obvious aortic dissection. Atherosclerotic changes of the aortic arch, proximal left subclavian artery, and coronary arteries are noted. No pneumothorax or pleural effusion Clear lungs. Stable right adrenal nodule. Atherosclerotic calcifications involving the abdominal aorta are noted. There are scattered pancreatic calcifications. No acute bony deformity. Review of the MIP images confirms the above findings. IMPRESSION: No evidence of acute pulmonary thromboembolism. Electronically Signed   By: Marybelle Killings M.D.   On: 04/13/2015 14:22   Dg Hip Unilat With Pelvis 2-3 Views Left  04/13/2015  CLINICAL DATA:  69 year old male with left hip pain after falling on his left hip when his late gave out while walking down the road earlier today EXAM: DG HIP (WITH OR WITHOUT PELVIS) 2-3V LEFT COMPARISON:  Prior radiographs of the pelvis and left hip 11/06/2014 FINDINGS: Surgical changes of prior repair of a right femoral fracture with intra medullary nail and transfemoral neck lag screw. No evidence of acute fracture or malalignment involving the left femur. The bony pelvis is intact. The bladder is partially opacified by excreted contrast material. Aortoiliac atherosclerotic calcifications noted. Normal bony mineralization. No lytic or blastic osseous lesion. IMPRESSION: No evidence of acute fracture or malalignment. Electronically Signed   By: Jacqulynn Cadet M.D.   On: 04/13/2015 19:10   I have personally reviewed and evaluated these images and lab results as part of my medical decision-making.   EKG Interpretation None      ED ECG REPORT   Date: 04/14/2015  Rate: 107  Rhythm: atrial fibrillation   QRS Axis: right  Intervals: no P waves  ST/T Wave abnormalities: normal  Conduction Disutrbances:none  Narrative Interpretation:   Old EKG Reviewed: unchanged  I have personally reviewed the EKG tracing and agree with the computerized printout as noted.   MDM   Final diagnoses:  Chest pain, unspecified chest pain type  Chronic pain   Presents to the ER for evaluation of chest pain. Reviewing the patient's records reveals that he was seen yesterday in the ER for chest pain, then again this morning in the ER for chest pain. He also had a visit this afternoon to [facility] for hip pain. Patient returns tonight for chest pain. Patient has nonexertional chest pain that is atypical in nature. He had a thorough workup performed earlier this morning including CT of chest. He does not require any significant workup today, EKG unchanged, troponin negative. Does not require serial troponins. Appropriate for discharge.  I personally performed the services described in this documentation, which was scribed in my presence. The recorded information has been reviewed and is accurate.    Orpah Greek, MD 04/14/15 [phone]

## 2015-04-13 NOTE — ED Provider Notes (Signed)
CSN: [id number]     Arrival date & time 04/13/15  0831 History   First MD Initiated Contact with Patient 04/13/15 [phone]     Chief Complaint  Patient presents with  . Chest Pain     (Consider location/radiation/quality/duration/timing/severity/associated sxs/prior Treatment) HPI Comments: Patient presents via EMS with left-sided chest pain onset this morning. He states he has been constant for the past hour period is the last of his chest and radiates to his arm. It is worse with palpation. EMS gave him aspirin and nitroglycerin which caused a drop in his blood pressure but did not change his chest pain. He was given a breathing treatment as well. Endorses a history of COPD, complete heart block status post pacemaker, atrial fibrillation not on Coumadin due to noncompliance. Notably this is patient's 6th ED visit in the past 48 hours. He reports he's never had a heart attack and does not have any stents in his heart. No fevers or vomiting.  The history is provided by the patient and the EMS personnel.    Past Medical History  Diagnosis Date  . Depression   . Hypomagnesemia   . Fall at nursing home 05/27/2013    "slipped on water; said I broke my right hip" (05/27/2013)  . Migraine     "used to have them bad; haven't had one in awhile" (05/27/2013)  . Gout attack     "not too long ago; in my right foot" (05/27/2013)  . Complete heart block ([place])     reason for pacemaker/notes 05/27/2013  . Atrial fibrillation ([place])     [name] 05/27/2013  . HLD (hyperlipidemia)   . GIB (gastrointestinal bleeding)   . BPH (benign prostatic hyperplasia)   . Presence of permanent cardiac pacemaker   . Hypertension   . Gastric ulcer   . Non Hodgkin's lymphoma ([place])   . Tubular adenoma of colon    Past Surgical History  Procedure Laterality Date  . Insert / replace / remove pacemaker    . Appendectomy    . Cholecystectomy    . Open reduction of hip Right 05/28/2013    Procedure: OPEN REDUCTION OF  HIP;  Surgeon: Renette Butters, MD;  Location: [place];  Service: Orthopedics;  Laterality: Right;  . Esophagogastroduodenoscopy (egd) with propofol N/A 09/26/2014    Procedure: ESOPHAGOGASTRODUODENOSCOPY (EGD) WITH PROPOFOL;  Surgeon: Jerene Bears, MD;  Location: [facility] ENDOSCOPY;  Service: Endoscopy;  Laterality: N/A;  . Colonoscopy N/A 10/01/2014    Procedure: COLONOSCOPY;  Surgeon: Jerene Bears, MD;  Location: [facility] ENDOSCOPY;  Service: Endoscopy;  Laterality: N/A;  . Partial colectomy N/A 10/05/2014    Procedure: SUBTOTAL COLECTOMY WITH ILEORECTAL ANASTOMOSIS;  Surgeon: Donnie Mesa, MD;  Location: [place];  Service: General;  Laterality: N/A;  . Flexible sigmoidoscopy N/A 11/07/2014    Procedure: FLEXIBLE SIGMOIDOSCOPY;  Surgeon: Jerene Bears, MD;  Location: [facility] ENDOSCOPY;  Service: Endoscopy;  Laterality: N/A;  . Gastric resection     Family History  Problem Relation Age of Onset  . Colon cancer Mother    Social History  Substance Use Topics  . Smoking status: Current Some Day Smoker -- 0.50 packs/day for 66 years    Types: Cigarettes  . Smokeless tobacco: Former Systems developer    Types: Snuff, Chew     Comment: 05/27/2013 "haven't used chew or snuff in ~ 30 yr"  . Alcohol Use: No     Comment: 05/27/2013 "none in years; never had problem w/it" - not in  30 years    Review of Systems  Constitutional: Positive for activity change, appetite change and fatigue.  HENT: Negative for congestion and rhinorrhea.   Eyes: Negative for photophobia and visual disturbance.  Respiratory: Positive for cough, chest tightness and shortness of breath.   Cardiovascular: Positive for chest pain.  Gastrointestinal: Positive for nausea and abdominal pain. Negative for vomiting.  Genitourinary: Negative for dysuria and hematuria.  Musculoskeletal: Negative for myalgias and arthralgias.  Neurological: Negative for dizziness, weakness and headaches.  A complete 10 system review of systems was obtained and all systems are  negative except as noted in the HPI and PMH.      Allergies  Review of patient's allergies indicates no known allergies.  Home Medications   Prior to Admission medications   Medication Sig Start Date End Date Taking? Authorizing Provider  carvedilol (COREG) 6.25 MG tablet Take 1 tablet (6.25 mg total) by mouth 2 (two) times daily with a meal. Patient not taking: Reported on 04/11/2015 11/16/14   Velvet Bathe, MD   BP 114/96 mmHg  Pulse 87  Temp(Src) 97.6 F (36.4 C) (Oral)  Resp 18  SpO2 98% Physical Exam  Constitutional: He is oriented to person, place, and time. He appears well-developed and well-nourished. No distress.  Frail and disheveled  HENT:  Head: Normocephalic and atraumatic.  Mouth/Throat: Oropharynx is clear and moist. No oropharyngeal exudate.  Eyes: Conjunctivae and EOM are normal. Pupils are equal, round, and reactive to light.  Neck: Normal range of motion. Neck supple.  No meningismus.  Cardiovascular: Normal rate, normal heart sounds and intact distal pulses.   No murmur heard. Irregular rhythm.  Pulmonary/Chest: Effort normal. No respiratory distress. He has wheezes. He exhibits tenderness.  L chest wall tenderness R sided pace maker  Abdominal: Soft. There is tenderness. There is no rebound and no guarding.  Genitourinary:  No gross blood  Musculoskeletal: Normal range of motion. He exhibits no edema or tenderness.  Neurological: He is alert and oriented to person, place, and time. No cranial nerve deficit. He exhibits normal muscle tone. Coordination normal.  No ataxia on finger to nose bilaterally. No pronator drift. 5/5 strength throughout. CN 2-12 intact. Negative Romberg. Equal grip strength. Sensation intact. Gait is normal.   Skin: Skin is warm.  Psychiatric: He has a normal mood and affect. His behavior is normal.  Nursing note and vitals reviewed.   ED Course  Procedures (including critical care time) Labs Review Labs Reviewed  CBC WITH  DIFFERENTIAL/PLATELET - Abnormal; Notable for the following:    RBC 3.42 (*)    Hemoglobin 10.1 (*)    HCT 33.1 (*)    RDW 15.6 (*)    Platelets 122 (*)    All other components within normal limits  COMPREHENSIVE METABOLIC PANEL  TROPONIN I  BRAIN NATRIURETIC PEPTIDE  URINALYSIS, ROUTINE W REFLEX MICROSCOPIC (NOT AT [facility])  POC OCCULT BLOOD, ED    Imaging Review Dg Chest 2 View  04/12/2015  CLINICAL DATA:  Chest pain.  Shortness of breath.  Smoker. EXAM: CHEST  2 VIEW COMPARISON:  03/05/2015 chest radiograph FINDINGS: Dual lead right subclavian pacemaker is stable in configuration with lead tips overlying the right atrium and right ventricle. Stable cardiomediastinal silhouette with atherosclerotic aortic arch and mild cardiomegaly. No pneumothorax. No pleural effusion. Clear lungs, with no focal lung consolidation and no pulmonary edema. IMPRESSION: 1. No active pulmonary disease. 2. Stable mild cardiomegaly without pulmonary edema. Electronically Signed   By: Janina Mayo.D.  On: 04/12/2015 21:28   I have personally reviewed and evaluated these images and lab results as part of my medical decision-making.   EKG Interpretation   Date/Time:  Thursday April 13 2015 08:47:42 EDT Ventricular Rate:  99 PR Interval:    QRS Duration: 113 QT Interval:  408 QTC Calculation: 524 R Axis:   115 Text Interpretation:  Atrial fibrillation Anterolateral infarct, old No  significant change was found Confirmed by Heddy Vidana  MD, Hoorain Kozakiewicz [phone]) on  04/13/2015 9:31:09 AM      MDM   Final diagnoses:  None   1 hour of central chest pain, similar to previous episodes. Worse with palpation.  EKG A fib without acute ST changes.  Recurrent visits for chest pain, as well as abdominal pain and rectal bleeding. No distress, asking for food. Lungs with scattered wheezing, irregular rhythm.  Hemoglobin stable. FOBT negative.  Troponin negative.  Low suspicion for ACS.  Pain similar to previous  episodes and reproducible.  EKG unchanged. Troponin negative x2. CT negative for PE or dissection.  Appears stable for discharge. Patient is homeless and does not have a cardiologist that he sees. Resources previously given. Return precautions discussed.  Ezequiel Essex, MD 04/13/15 [phone]

## 2015-04-13 NOTE — ED Notes (Addendum)
Per ems: Patient  Tripped and states that he fell in the street and hurt his left hip. The patient the was ambulatory to the Ambulance.

## 2015-04-13 NOTE — Discharge Instructions (Signed)

## 2015-04-13 NOTE — ED Notes (Signed)
Third ER visit today. Presents with left sided chest pain, pain began earlier today, PT WAS D/C FROM HERE AND RETURNED THIS EVENING WITH SAME COMPLAINTS. BILATERAL WHEEZES-hr 100-110 AFIB ON MONITOR. PAIN IS RATED 10/10. PAIN BEGAN AGAIN TODAY WHILE SITTING ON A BENCH.

## 2015-04-14 ENCOUNTER — Emergency Department (HOSPITAL_COMMUNITY)
Admission: EM | Admit: 2015-04-14 | Discharge: 2015-04-14 | Disposition: A | Discharge status: Home / Self Care | Payer: Medicare Other | Source: Home / Self Care | Attending: Emergency Medicine | Admitting: Emergency Medicine

## 2015-04-14 ENCOUNTER — Encounter (HOSPITAL_COMMUNITY): Payer: Self-pay | Admitting: Vascular Surgery

## 2015-04-14 DIAGNOSIS — R0789 Other chest pain: Secondary | ICD-10-CM | POA: Diagnosis not present

## 2015-04-14 DIAGNOSIS — R079 Chest pain, unspecified: Secondary | ICD-10-CM

## 2015-04-14 DIAGNOSIS — G8929 Other chronic pain: Secondary | ICD-10-CM

## 2015-04-14 LAB — CBC WITH DIFFERENTIAL/PLATELET
BASOS ABS: 0 10*3/uL (ref 0.0–0.1)
BASOS PCT: 0 %
EOS ABS: 0.2 10*3/uL (ref 0.0–0.7)
Eosinophils Relative: 3 %
HEMATOCRIT: 29.9 % — AB (ref 39.0–52.0)
HEMOGLOBIN: 9.5 g/dL — AB (ref 13.0–17.0)
Lymphocytes Relative: 30 %
Lymphs Abs: 1.8 10*3/uL (ref 0.7–4.0)
MCH: 30.7 pg (ref 26.0–34.0)
MCHC: 31.8 g/dL (ref 30.0–36.0)
MCV: 96.8 fL (ref 78.0–100.0)
MONOS PCT: 9 %
Monocytes Absolute: 0.5 10*3/uL (ref 0.1–1.0)
NEUTROS ABS: 3.5 10*3/uL (ref 1.7–7.7)
NEUTROS PCT: 58 %
Platelets: 127 10*3/uL — ABNORMAL LOW (ref 150–400)
RBC: 3.09 MIL/uL — AB (ref 4.22–5.81)
RDW: 15.9 % — ABNORMAL HIGH (ref 11.5–15.5)
WBC: 6 10*3/uL (ref 4.0–10.5)

## 2015-04-14 LAB — BASIC METABOLIC PANEL
ANION GAP: 12 (ref 5–15)
ANION GAP: 9 (ref 5–15)
BUN: 12 mg/dL (ref 6–20)
BUN: 14 mg/dL (ref 6–20)
CHLORIDE: 106 mmol/L (ref 101–111)
CHLORIDE: 107 mmol/L (ref 101–111)
CO2: 21 mmol/L — AB (ref 22–32)
CO2: 24 mmol/L (ref 22–32)
Calcium: 8.9 mg/dL (ref 8.9–10.3)
Calcium: 9.5 mg/dL (ref 8.9–10.3)
Creatinine, Ser: 0.75 mg/dL (ref 0.61–1.24)
Creatinine, Ser: 0.81 mg/dL (ref 0.61–1.24)
GFR calc non Af Amer: >60 mL/min (ref >60)
Glucose, Bld: 105 mg/dL — ABNORMAL HIGH (ref 65–99)
Glucose, Bld: 115 mg/dL — ABNORMAL HIGH (ref 65–99)
POTASSIUM: 4 mmol/L (ref 3.5–5.1)
POTASSIUM: 4 mmol/L (ref 3.5–5.1)
SODIUM: 136 mmol/L (ref 135–145)
SODIUM: 143 mmol/L (ref 135–145)

## 2015-04-14 LAB — CBC
HEMATOCRIT: 31.9 % — AB (ref 39.0–52.0)
Hemoglobin: 10 g/dL — ABNORMAL LOW (ref 13.0–17.0)
MCH: 30 pg (ref 26.0–34.0)
MCHC: 31.3 g/dL (ref 30.0–36.0)
MCV: 95.8 fL (ref 78.0–100.0)
PLATELETS: 160 10*3/uL (ref 150–400)
RBC: 3.33 MIL/uL — ABNORMAL LOW (ref 4.22–5.81)
RDW: 15.6 % — AB (ref 11.5–15.5)
WBC: 5.2 10*3/uL (ref 4.0–10.5)

## 2015-04-14 LAB — I-STAT TROPONIN, ED: Troponin i, poc: 0 ng/mL (ref 0.00–0.08)

## 2015-04-14 LAB — TROPONIN I

## 2015-04-14 MED ORDER — ACETAMINOPHEN 325 MG PO TABS
650.0000 mg | ORAL_TABLET | Freq: Once | ORAL | Status: AC
  Administered 2015-04-14: 650 mg via ORAL
  Filled 2015-04-14: qty 2

## 2015-04-14 NOTE — ED Provider Notes (Signed)
CSN: [id number]     Arrival date & time 04/14/15  2003 History   First MD Initiated Contact with Patient 04/14/15 2046     Chief Complaint  Patient presents with  . Chest Pain     (Consider location/radiation/quality/duration/timing/severity/associated sxs/prior Treatment) Patient is a 69 y.o. male presenting with chest pain. The history is provided by the patient. No language interpreter was used.  Chest Pain Pain location:  L chest Pain quality: aching   Associated symptoms: shortness of breath   Associated symptoms: no fever and not vomiting   Associated symptoms comment:  Complains of sudden onset chest pain that is sharp, associated with SOB. Pain is worse with deep breathing. No fever. He had a NTG x 1 prior to arrival without change. He also complains of cough and of gout pain.   Past Medical History  Diagnosis Date  . Depression   . Hypomagnesemia   . Fall at nursing home 05/27/2013    "slipped on water; said I broke my right hip" (05/27/2013)  . Migraine     "used to have them bad; haven't had one in awhile" (05/27/2013)  . Gout attack     "not too long ago; in my right foot" (05/27/2013)  . Complete heart block ([place])     reason for pacemaker/notes 05/27/2013  . Atrial fibrillation ([place])     [name] 05/27/2013  . HLD (hyperlipidemia)   . GIB (gastrointestinal bleeding)   . BPH (benign prostatic hyperplasia)   . Presence of permanent cardiac pacemaker   . Hypertension   . Gastric ulcer   . Non Hodgkin's lymphoma ([place])   . Tubular adenoma of colon    Past Surgical History  Procedure Laterality Date  . Insert / replace / remove pacemaker    . Appendectomy    . Cholecystectomy    . Open reduction of hip Right 05/28/2013    Procedure: OPEN REDUCTION OF HIP;  Surgeon: Renette Butters, MD;  Location: [facility];  Service: Orthopedics;  Laterality: Right;  . Esophagogastroduodenoscopy (egd) with propofol N/A 09/26/2014    Procedure: ESOPHAGOGASTRODUODENOSCOPY (EGD) WITH  PROPOFOL;  Surgeon: Jerene Bears, MD;  Location: [facility] ENDOSCOPY;  Service: Endoscopy;  Laterality: N/A;  . Colonoscopy N/A 10/01/2014    Procedure: COLONOSCOPY;  Surgeon: Jerene Bears, MD;  Location: [facility] ENDOSCOPY;  Service: Endoscopy;  Laterality: N/A;  . Partial colectomy N/A 10/05/2014    Procedure: SUBTOTAL COLECTOMY WITH ILEORECTAL ANASTOMOSIS;  Surgeon: Donnie Mesa, MD;  Location: [facility];  Service: General;  Laterality: N/A;  . Flexible sigmoidoscopy N/A 11/07/2014    Procedure: FLEXIBLE SIGMOIDOSCOPY;  Surgeon: Jerene Bears, MD;  Location: [facility] ENDOSCOPY;  Service: Endoscopy;  Laterality: N/A;  . Gastric resection     Family History  Problem Relation Age of Onset  . Colon cancer Mother    Social History  Substance Use Topics  . Smoking status: Current Some Day Smoker -- 0.50 packs/day for 66 years    Types: Cigarettes  . Smokeless tobacco: Former Systems developer    Types: Snuff, Chew     Comment: 05/27/2013 "haven't used chew or snuff in ~ 30 yr"  . Alcohol Use: No     Comment: 05/27/2013 "none in years; never had problem w/it" - not in 30 years    Review of Systems  Constitutional: Negative for fever and chills.  HENT: Negative.   Respiratory: Positive for shortness of breath.   Cardiovascular: Positive for chest pain.  Gastrointestinal: Negative.  Negative for  vomiting.  Musculoskeletal: Negative.  Negative for myalgias.  Skin: Negative.   Neurological: Negative.       Allergies  Review of patient's allergies indicates no known allergies.  Home Medications   Prior to Admission medications   Medication Sig Start Date End Date Taking? Authorizing Provider  carvedilol (COREG) 6.25 MG tablet Take 1 tablet (6.25 mg total) by mouth 2 (two) times daily with a meal. Patient not taking: Reported on 04/11/2015 11/16/14   Velvet Bathe, MD   BP 130/66 mmHg  Pulse 95  Temp(Src) 97.8 F (36.6 C) (Oral)  Resp 18  SpO2 97% Physical Exam  Constitutional: He is oriented to person, place, and  time. He appears well-developed and well-nourished.  Cachectic appearing male.   HENT:  Head: Normocephalic.  Neck: Normal range of motion. Neck supple.  Cardiovascular: Normal rate and regular rhythm.   No murmur heard. Pulmonary/Chest: Effort normal and breath sounds normal. He has no wheezes. He has no rales.  Pacemaker in right upper chest.  Abdominal: Soft. Bowel sounds are normal. There is no tenderness. There is no rebound and no guarding.  Musculoskeletal: Normal range of motion. He exhibits no edema.  Neurological: He is alert and oriented to person, place, and time.  Skin: Skin is warm and dry. No rash noted.  Psychiatric: He has a normal mood and affect.    ED Course  Procedures (including critical care time) Labs Review Labs Reviewed  BASIC METABOLIC PANEL - Abnormal; Notable for the following:    Glucose, Bld 115 (*)    All other components within normal limits  CBC - Abnormal; Notable for the following:    RBC 3.33 (*)    Hemoglobin 10.0 (*)    HCT 31.9 (*)    RDW 15.6 (*)    All other components within normal limits  I-STAT TROPOININ, ED    Imaging Review Dg Chest 2 View  04/13/2015  CLINICAL DATA:  Two week history of productive cough EXAM: CHEST  2 VIEW COMPARISON:  April 12, 2015 FINDINGS: There is no edema or consolidation. Heart size and pulmonary vascularity are within normal limits. Pacemaker leads are attached to the right atrium and right ventricle. No adenopathy. No pneumothorax. No bone lesions. IMPRESSION: No edema or consolidation. Electronically Signed   By: Lowella Grip III M.D.   On: 04/13/2015 10:01   Ct Angio Chest Pe W/cm &/or Wo Cm  04/13/2015  CLINICAL DATA:  Chest pain and short of breath since yesterday EXAM: CT ANGIOGRAPHY CHEST WITH CONTRAST TECHNIQUE: Multidetector CT imaging of the chest was performed using the standard protocol during bolus administration of intravenous contrast. Multiplanar CT image reconstructions and MIPs were  obtained to evaluate the vascular anatomy. CONTRAST:  190mL OMNIPAQUE IOHEXOL 350 MG/ML SOLN COMPARISON:  11/23/2014 FINDINGS: There are no filling defects in the pulmonary arterial tree to suggest acute pulmonary thromboembolism. Dual lead left subclavian pacemaker and leads are in the right atrium and right ventricle. No abnormal mediastinal adenopathy by measurement criteria. No obvious aortic dissection. Atherosclerotic changes of the aortic arch, proximal left subclavian artery, and coronary arteries are noted. No pneumothorax or pleural effusion Clear lungs. Stable right adrenal nodule. Atherosclerotic calcifications involving the abdominal aorta are noted. There are scattered pancreatic calcifications. No acute bony deformity. Review of the MIP images confirms the above findings. IMPRESSION: No evidence of acute pulmonary thromboembolism. Electronically Signed   By: Marybelle Killings M.D.   On: 04/13/2015 14:22   Dg Hip Unilat With Pelvis  2-3 Views Left  04/13/2015  CLINICAL DATA:  69 year old male with left hip pain after falling on his left hip when his late gave out while walking down the road earlier today EXAM: DG HIP (WITH OR WITHOUT PELVIS) 2-3V LEFT COMPARISON:  Prior radiographs of the pelvis and left hip 11/06/2014 FINDINGS: Surgical changes of prior repair of a right femoral fracture with intra medullary nail and transfemoral neck lag screw. No evidence of acute fracture or malalignment involving the left femur. The bony pelvis is intact. The bladder is partially opacified by excreted contrast material. Aortoiliac atherosclerotic calcifications noted. Normal bony mineralization. No lytic or blastic osseous lesion. IMPRESSION: No evidence of acute fracture or malalignment. Electronically Signed   By: Jacqulynn Cadet M.D.   On: 04/13/2015 19:10   I have personally reviewed and evaluated these images and lab results as part of my medical decision-making.   EKG Interpretation   Date/Time:   Friday April 14 2015 20:13:38 EDT Ventricular Rate:  100 PR Interval:    QRS Duration: 96 QT Interval:  314 QTC Calculation: 405 R Axis:   142 Text Interpretation:  Atrial fibrillation Right axis deviation Incomplete  right bundle branch block Septal infarct , age undetermined Abnormal ECG  no significant change from yesterday Confirmed by GOLDSTON  MD, SCOTT  ([id number]) on 04/14/2015 8:49:57 PM      MDM   Final diagnoses:  None    1. Chronic chest pain  The patient has had multiple chest pain evaluations over the last 3 days, including negative chest CT. Labs, troponin negative tonight. He can be discharged home and is encouraged to see his doctor in the outpatient setting.    Charlann Lange, PA-C 04/14/15 [id number]  Sherwood Gambler, MD 04/18/15 2325

## 2015-04-14 NOTE — Discharge Instructions (Signed)
SEE YOUR DOCTOR IN HIS OFFICE FOR FURTHER MANAGEMENT OF CHRONIC/RECURRENT CHEST PAIN AND PAIN FROM HISTORY OF GOUTY ARTHRITIS.

## 2015-04-14 NOTE — ED Notes (Signed)
Pt left before discharge paperwork was ready. Emptied room, took clock off wall, took phone out or room, emptied drawers, Scott from security notified.  [name] went to bus stop and was able to retrieve items back.

## 2015-04-14 NOTE — ED Notes (Signed)
Scott from security notified Pt left with IV in left arm that was placed in triage.

## 2015-04-14 NOTE — Discharge Instructions (Signed)
Nonspecific Chest Pain  °Chest pain can be caused by many different conditions. There is always a chance that your pain could be related to something serious, such as a heart attack or a blood clot in your lungs. Chest pain can also be caused by conditions that are not life-threatening. If you have chest pain, it is very important to follow up with your health care provider. °CAUSES  °Chest pain can be caused by: °· Heartburn. °· Pneumonia or bronchitis. °· Anxiety or stress. °· Inflammation around your heart (pericarditis) or lung (pleuritis or pleurisy). °· A blood clot in your lung. °· A collapsed lung (pneumothorax). It can develop suddenly on its own (spontaneous pneumothorax) or from trauma to the chest. °· Shingles infection (varicella-zoster virus). °· Heart attack. °· Damage to the bones, muscles, and cartilage that make up your chest wall. This can include: °¨ Bruised bones due to injury. °¨ Strained muscles or cartilage due to frequent or repeated coughing or overwork. °¨ Fracture to one or more ribs. °¨ Sore cartilage due to inflammation (costochondritis). °RISK FACTORS  °Risk factors for chest pain may include: °· Activities that increase your risk for trauma or injury to your chest. °· Respiratory infections or conditions that cause frequent coughing. °· Medical conditions or overeating that can cause heartburn. °· Heart disease or family history of heart disease. °· Conditions or health behaviors that increase your risk of developing a blood clot. °· Having had chicken pox (varicella zoster). °SIGNS AND SYMPTOMS °Chest pain can feel like: °· Burning or tingling on the surface of your chest or deep in your chest. °· Crushing, pressure, aching, or squeezing pain. °· Dull or sharp pain that is worse when you move, cough, or take a deep breath. °· Pain that is also felt in your back, neck, shoulder, or arm, or pain that spreads to any of these areas. °Your chest pain may come and go, or it may stay  constant. °DIAGNOSIS °Lab tests or other studies may be needed to find the cause of your pain. Your health care provider may have you take a test called an ambulatory ECG (electrocardiogram). An ECG records your heartbeat patterns at the time the test is performed. You may also have other tests, such as: °· Transthoracic echocardiogram (TTE). During echocardiography, sound waves are used to create a picture of all of the heart structures and to look at how blood flows through your heart. °· Transesophageal echocardiogram (TEE). This is a more advanced imaging test that obtains images from inside your body. It allows your health care provider to see your heart in finer detail. °· Cardiac monitoring. This allows your health care provider to monitor your heart rate and rhythm in real time. °· Holter monitor. This is a portable device that records your heartbeat and can help to diagnose abnormal heartbeats. It allows your health care provider to track your heart activity for several days, if needed. °· Stress tests. These can be done through exercise or by taking medicine that makes your heart beat more quickly. °· Blood tests. °· Imaging tests. °TREATMENT  °Your treatment depends on what is causing your chest pain. Treatment may include: °· Medicines. These may include: °¨ Acid blockers for heartburn. °¨ Anti-inflammatory medicine. °¨ Pain medicine for inflammatory conditions. °¨ Antibiotic medicine, if an infection is present. °¨ Medicines to dissolve blood clots. °¨ Medicines to treat coronary artery disease. °· Supportive care for conditions that do not require medicines. This may include: °¨ Resting. °¨ Applying heat   or cold packs to injured areas. °¨ Limiting activities until pain decreases. °HOME CARE INSTRUCTIONS °· If you were prescribed an antibiotic medicine, finish it all even if you start to feel better. °· Avoid any activities that bring on chest pain. °· Do not use any tobacco products, including  cigarettes, chewing tobacco, or electronic cigarettes. If you need help quitting, ask your health care provider. °· Do not drink alcohol. °· Take medicines only as directed by your health care provider. °· Keep all follow-up visits as directed by your health care provider. This is important. This includes any further testing if your chest pain does not go away. °· If heartburn is the cause for your chest pain, you may be told to keep your head raised (elevated) while sleeping. This reduces the chance that acid will go from your stomach into your esophagus. °· Make lifestyle changes as directed by your health care provider. These may include: °¨ Getting regular exercise. Ask your health care provider to suggest some activities that are safe for you. °¨ Eating a heart-healthy diet. A registered dietitian can help you to learn healthy eating options. °¨ Maintaining a healthy weight. °¨ Managing diabetes, if necessary. °¨ Reducing stress. °SEEK MEDICAL CARE IF: °· Your chest pain does not go away after treatment. °· You have a rash with blisters on your chest. °· You have a fever. °SEEK IMMEDIATE MEDICAL CARE IF:  °· Your chest pain is worse. °· You have an increasing cough, or you cough up blood. °· You have severe abdominal pain. °· You have severe weakness. °· You faint. °· You have chills. °· You have sudden, unexplained chest discomfort. °· You have sudden, unexplained discomfort in your arms, back, neck, or jaw. °· You have shortness of breath at any time. °· You suddenly start to sweat, or your skin gets clammy. °· You feel nauseous or you vomit. °· You suddenly feel light-headed or dizzy. °· Your heart begins to beat quickly, or it feels like it is skipping beats. °These symptoms may represent a serious problem that is an emergency. Do not wait to see if the symptoms will go away. Get medical help right away. Call your local emergency services (911 in the [place] Do not drive yourself to the hospital. °  °This  information is not intended to replace advice given to you by your health care provider. Make sure you discuss any questions you have with your health care provider. °  °Document Released: 03/06/2005 Document Revised: 06/17/2014 Document Reviewed: 12/31/2013 °Elsevier Interactive Patient Education ©2016 Elsevier Inc[employer] ° °

## 2015-04-14 NOTE — ED Notes (Signed)
Pt reports to the ED for eval of sudden onset CP. Unable to describe. Reports associated symptoms of SOB, lightheadedness, and dizziness. Pain worse with deep breaths. Has had a productive yellow cough. 12 lead unremarkable. He had 324 of ASA and 0.4 of nitro PTA. Pt reports the nitro did not help his pain. He was seen last pm for the same. Pt A&Ox3, resp e/u, and skin warm and dry. Disoriented to time

## 2015-05-10 ENCOUNTER — Encounter (HOSPITAL_COMMUNITY): Payer: Self-pay | Admitting: *Deleted

## 2015-05-10 ENCOUNTER — Emergency Department (HOSPITAL_COMMUNITY)
Admission: EM | Admit: 2015-05-10 | Discharge: 2015-05-10 | Disposition: A | Discharge status: Home / Self Care | Payer: Medicare Other | Attending: Emergency Medicine | Admitting: Emergency Medicine

## 2015-05-10 ENCOUNTER — Emergency Department (HOSPITAL_COMMUNITY): Payer: Medicare Other

## 2015-05-10 DIAGNOSIS — I252 Old myocardial infarction: Secondary | ICD-10-CM | POA: Insufficient documentation

## 2015-05-10 DIAGNOSIS — I4891 Unspecified atrial fibrillation: Secondary | ICD-10-CM | POA: Insufficient documentation

## 2015-05-10 DIAGNOSIS — Y92149 Unspecified place in prison as the place of occurrence of the external cause: Secondary | ICD-10-CM | POA: Insufficient documentation

## 2015-05-10 DIAGNOSIS — W010XXA Fall on same level from slipping, tripping and stumbling without subsequent striking against object, initial encounter: Secondary | ICD-10-CM | POA: Insufficient documentation

## 2015-05-10 DIAGNOSIS — Y93E1 Activity, personal bathing and showering: Secondary | ICD-10-CM | POA: Diagnosis not present

## 2015-05-10 DIAGNOSIS — F17210 Nicotine dependence, cigarettes, uncomplicated: Secondary | ICD-10-CM | POA: Insufficient documentation

## 2015-05-10 DIAGNOSIS — S52022A Displaced fracture of olecranon process without intraarticular extension of left ulna, initial encounter for closed fracture: Secondary | ICD-10-CM | POA: Insufficient documentation

## 2015-05-10 DIAGNOSIS — Z8572 Personal history of non-Hodgkin lymphomas: Secondary | ICD-10-CM | POA: Insufficient documentation

## 2015-05-10 DIAGNOSIS — S5002XA Contusion of left elbow, initial encounter: Secondary | ICD-10-CM | POA: Diagnosis not present

## 2015-05-10 DIAGNOSIS — I10 Essential (primary) hypertension: Secondary | ICD-10-CM | POA: Diagnosis not present

## 2015-05-10 DIAGNOSIS — Z950 Presence of cardiac pacemaker: Secondary | ICD-10-CM | POA: Diagnosis not present

## 2015-05-10 DIAGNOSIS — Y998 Other external cause status: Secondary | ICD-10-CM | POA: Diagnosis not present

## 2015-05-10 DIAGNOSIS — Z8659 Personal history of other mental and behavioral disorders: Secondary | ICD-10-CM | POA: Diagnosis not present

## 2015-05-10 DIAGNOSIS — Z8739 Personal history of other diseases of the musculoskeletal system and connective tissue: Secondary | ICD-10-CM | POA: Diagnosis not present

## 2015-05-10 DIAGNOSIS — Z86018 Personal history of other benign neoplasm: Secondary | ICD-10-CM | POA: Insufficient documentation

## 2015-05-10 DIAGNOSIS — Z87438 Personal history of other diseases of male genital organs: Secondary | ICD-10-CM | POA: Diagnosis not present

## 2015-05-10 DIAGNOSIS — Z23 Encounter for immunization: Secondary | ICD-10-CM | POA: Diagnosis not present

## 2015-05-10 DIAGNOSIS — S59902A Unspecified injury of left elbow, initial encounter: Secondary | ICD-10-CM | POA: Diagnosis present

## 2015-05-10 DIAGNOSIS — Z8719 Personal history of other diseases of the digestive system: Secondary | ICD-10-CM | POA: Insufficient documentation

## 2015-05-10 DIAGNOSIS — Z8639 Personal history of other endocrine, nutritional and metabolic disease: Secondary | ICD-10-CM | POA: Diagnosis not present

## 2015-05-10 MED ORDER — HYDROCODONE-ACETAMINOPHEN 5-325 MG PO TABS
1.0000 | ORAL_TABLET | Freq: Once | ORAL | Status: AC
  Administered 2015-05-10: 1 via ORAL
  Filled 2015-05-10: qty 1

## 2015-05-10 MED ORDER — TETANUS-DIPHTH-ACELL PERTUSSIS 5-2.5-18.5 LF-MCG/0.5 IM SUSP
0.5000 mL | Freq: Once | INTRAMUSCULAR | Status: AC
  Administered 2015-05-10: 0.5 mL via INTRAMUSCULAR
  Filled 2015-05-10: qty 0.5
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
## 2015-05-10 NOTE — ED Notes (Signed)
md at bedside  Pt alert and oriented x4. Respirations even and unlabored, bilateral symmetrical rise and fall of chest. Skin warm and dry. In no acute distress. Denies needs.   

## 2015-05-10 NOTE — Discharge Instructions (Signed)
Your elbow is broken and will need surgery.  Follow up with the Orthopedic Surgeon later this week.  Keep your arm elevated when possible to help reduce swelling.     Cast or Splint Care Casts and splints support injured limbs and keep bones from moving while they heal. It is important to care for your cast or splint at home.  HOME CARE INSTRUCTIONS  Keep the cast or splint uncovered during the drying period. It can take 24 to 48 hours to dry if it is made of plaster. A fiberglass cast will dry in less than 1 hour.  Do not rest the cast on anything harder than a pillow for the first 24 hours.  Do not put weight on your injured limb or apply pressure to the cast until your health care provider gives you permission.  Keep the cast or splint dry. Wet casts or splints can lose their shape and may not support the limb as well. A wet cast that has lost its shape can also create harmful pressure on your skin when it dries. Also, wet skin can become infected.  Cover the cast or splint with a plastic bag when bathing or when out in the rain or snow. If the cast is on the trunk of the body, take sponge baths until the cast is removed.  If your cast does become wet, dry it with a towel or a blow dryer on the cool setting only.  Keep your cast or splint clean. Soiled casts may be wiped with a moistened cloth.  Do not place any hard or soft foreign objects under your cast or splint, such as cotton, toilet paper, lotion, or powder.  Do not try to scratch the skin under the cast with any object. The object could get stuck inside the cast. Also, scratching could lead to an infection. If itching is a problem, use a blow dryer on a cool setting to relieve discomfort.  Do not trim or cut your cast or remove padding from inside of it.  Exercise all joints next to the injury that are not immobilized by the cast or splint. For example, if you have a long leg cast, exercise the hip joint and toes. If you have  an arm cast or splint, exercise the shoulder, elbow, thumb, and fingers.  Elevate your injured arm or leg on 1 or 2 pillows for the first 1 to 3 days to decrease swelling and pain.It is best if you can comfortably elevate your cast so it is higher than your heart. SEEK MEDICAL CARE IF:   Your cast or splint cracks.  Your cast or splint is too tight or too loose.  You have unbearable itching inside the cast.  Your cast becomes wet or develops a soft spot or area.  You have a bad smell coming from inside your cast.  You get an object stuck under your cast.  Your skin around the cast becomes red or raw.  You have new pain or worsening pain after the cast has been applied. SEEK IMMEDIATE MEDICAL CARE IF:   You have fluid leaking through the cast.  You are unable to move your fingers or toes.  You have discolored (blue or white), cool, painful, or very swollen fingers or toes beyond the cast.  You have tingling or numbness around the injured area.  You have severe pain or pressure under the cast.  You have any difficulty with your breathing or have shortness of breath.  You  have chest pain.   This information is not intended to replace advice given to you by your health care provider. Make sure you discuss any questions you have with your health care provider.   Document Released: 05/24/2000 Document Revised: 03/17/2013 Document Reviewed: 12/03/2012 Elsevier Interactive Patient Education [employer]. An elbow fracture is a break in a bone or bones of your elbow. Your elbow is a hinged joint that is made up of three bones. These bones are the long bone in your upper arm (humerus), the bone in the outer part of your lower arm (radius), and the bone in the inner part of your lower arm (ulna). The tip of your elbow (olecranon) is part of your ulna. If the fracture is displaced, that means that the bones are not lined up correctly and may be unstable. The bones will be put back  into position with a procedure that is called open reduction with internal fixation (ORIF). A combination of metal pins, screws with or without a metal plate, or different types of wiring are used to hold the bones in place. LET [facility] CARE PROVIDER KNOW ABOUT:  Any allergies you have.  All medicines you are taking, including vitamins, herbs, eye drops, creams, and over-the-counter medicines.  Previous problems you or members of your family have had with the use of anesthetics.  Any blood disorders you have.  Previous surgeries you have had.  Any medical conditions you may have. RISKS AND COMPLICATIONS Generally, this is a safe procedure. However, problems may occur, including:  Bleeding.  Infection.  Elbow stiffness.  Nerve damage.  Blood vessel damage.  Loosening or breaking of the screws or plates that are used for internal fixation.  Failure of the fracture to heal properly.  Need to have surgery again. BEFORE THE PROCEDURE  Ask your health care provider about:  Changing or stopping your regular medicines. This is especially important if you are taking diabetes medicines or blood thinners.  Taking medicines such as aspirin and ibuprofen. These medicines can thin your blood. Do not take these medicines before your procedure if your health care provider instructs you not to.  Do not drink alcohol before your procedure or as directed by your health care provider.  Do not use any tobacco products, including cigarettes, chewing tobacco, or e-cigarettes before the procedure or as directed by your health care provider. If you need help quitting, ask your health care provider. Using tobacco products can delay or prevent bone healing.  Follow instructions from your health care provider about eating or drinking restrictions.  Plan to have someone take you home after the procedure.  If you go home right after the procedure, plan to have someone with you for 24  hours. PROCEDURE  An IV tube will be inserted into one of your veins.  You will be given one or more of the following:  A medicine that helps you relax (sedative).  A medicine that makes you fall asleep (general anesthetic).  A medicine that is injected into an area of your body that numbs everything below the injection site (regional anesthetic).  Your arm will be cleaned with a germ-killing solution (antiseptic) and covered with sterile cloths.  Your surgeon will make an incision in your elbow over the fracture.  The structures around your elbow will be moved aside carefully to expose your fracture.  The bone pieces will be put back into their normal positions.  Your surgeon may use metal screws, wires, pins, or  plates to hold the bone pieces in position.  After the bones are back in place, the incision will be closed with stitches (sutures) or staples.  A bandage (dressing) will be placed over the incision.  A splint may be placed on your arm. The procedure may vary among health care providers and hospitals. AFTER THE PROCEDURE  Your blood pressure, heart rate, breathing rate, and blood oxygen level will be monitored often until the medicines you were given have worn off.  It is normal to have pain after the procedure. You will be given medicine for pain as needed.  You may be sent home with a sling to support your arm.   This information is not intended to replace advice given to you by your health care provider. Make sure you discuss any questions you have with your health care provider.   Document Released: 11/20/2000 Document Revised: 10/11/2014 Document Reviewed: 05/23/2014 Elsevier Interactive Patient Education [employer].

## 2015-05-10 NOTE — Progress Notes (Addendum)
CM spoke with pt who confirms medicare pt originally from [facility] but has been a homeless [employer] resident with [name] listed in [place] as pcp.  Pt reports he is presently in [place] sitting with him confirms pt has 20 more days of jail to serve but has been in [employer] jail very frequently and has been noted to "leave for six hours or more and return to jail" in 2016 Pt an Officer believes pt was in jail or going to jail on 03/03/15 when [facility] PM Cm assess and provided resources to the pt He was referred to [facility] and [employer] account planner but pt confirms he did not follow up He reports he is "the only child" and has no relatives At times through out the assessment ED CM had to speak loudly and pt would provide answers not address Pt states he does not have issues with hearing but CM questions his hearing ability Pt did confirm he does have some issues with reading skills  Pt with [facility] ED visits and 4 admissions in the last 6 months various visits for Chest pain Pt informed CM he has a pacemaker placed in during the "seventies" and "the doctor has retired" Pt can not recall CV name or location of CV office but states CV was associated with [facility] Pt reports having ' half a colon" and pending surgery Pt states he has also lived in "[place]"   CM re reviewed [place] resources to explain to him he could go for showers, laundry, computer access, job counseling, resume help, referrals for food/clothing, Mental Health assist, housing  The officer states he will place the resources in pt's belongings at the jail to access at d/c Officer reports pt is seen at jail by a [place] discussed and provided written information for accepting pcps, discussed the importance of pcp vs EDP services for f/u care, www.needymeds.org, www.goodrx.com, discounted pharmacies and other [employer] such as [employer], affordable care act,  financial assistance, dental services, [place] med assist, DSS and  health department  Reviewed resources for [employer] accepting pcps like Jinny Blossom, family medicine at [employer], [facility], [facility], local urgent care centers, [facility], [facility] family practice, general medical clinics, family services of the [place], [facility] urgent care plus others, medication resources, [facility] out patient pharmacies and housing Pt voiced understanding   Entered in D/c instructions Please use the resources provided to you in emergency room by case manager to assist with doctor for follow up These [employer] provide possible primary care providers, resources for discounted medications, housing, dental resources, affordable care act information, plus other resources for [employer]) Go on 05/31/2015 [address], [place], [place] [address] [phone] by 2 pm Monday-Friday & sign in at the front desk. Come if you need showers, laundry, computer access, job counseling, resume help, referrals for food/clothing, Mental Health assist, housing

## 2015-05-10 NOTE — ED Notes (Addendum)
Pt in custody and escorted to discharge window. Pt verbalized understanding discharge instructions. In no acute distress. Sheriff states he will give discharge instructions to jail nurse.

## 2015-05-10 NOTE — ED Provider Notes (Signed)
CSN: [id number]     Arrival date & time 05/10/15  29 History   First MD Initiated Contact with Patient 05/10/15 1054     Chief Complaint  Patient presents with  . Elbow Pain     The history is provided by the patient. No language interpreter was used.   [name] is a 69 y.o. male who presents to the Emergency Department complaining of left elbow pain. He is in police custody and slipped getting out of the shower yesterday and fell onto his left elbow and shoulder. He reports immediate pain and swelling to the left elbow. He did strike his head but did not pass out. He has no current headache. No nausea, vomiting, numbness, weakness. He had outpatient imaging that demonstrated olecranon fracture. He does not take blood thinners.   Past Medical History  Diagnosis Date  . Depression   . Hypomagnesemia   . Fall at nursing home 05/27/2013    "slipped on water; said I broke my right hip" (05/27/2013)  . Migraine     "used to have them bad; haven't had one in awhile" (05/27/2013)  . Gout attack     "not too long ago; in my right foot" (05/27/2013)  . Complete heart block ([place])     reason for pacemaker/notes 05/27/2013  . Atrial fibrillation ([place])     [name] 05/27/2013  . HLD (hyperlipidemia)   . GIB (gastrointestinal bleeding)   . BPH (benign prostatic hyperplasia)   . Presence of permanent cardiac pacemaker   . Hypertension   . Gastric ulcer   . Non Hodgkin's lymphoma ([place])   . Tubular adenoma of colon    Past Surgical History  Procedure Laterality Date  . Insert / replace / remove pacemaker    . Appendectomy    . Cholecystectomy    . Open reduction of hip Right 05/28/2013    Procedure: OPEN REDUCTION OF HIP;  Surgeon: Renette Butters, MD;  Location: [place];  Service: Orthopedics;  Laterality: Right;  . Esophagogastroduodenoscopy (egd) with propofol N/A 09/26/2014    Procedure: ESOPHAGOGASTRODUODENOSCOPY (EGD) WITH PROPOFOL;  Surgeon: Jerene Bears, MD;  Location: [facility]  ENDOSCOPY;  Service: Endoscopy;  Laterality: N/A;  . Colonoscopy N/A 10/01/2014    Procedure: COLONOSCOPY;  Surgeon: Jerene Bears, MD;  Location: [facility] ENDOSCOPY;  Service: Endoscopy;  Laterality: N/A;  . Partial colectomy N/A 10/05/2014    Procedure: SUBTOTAL COLECTOMY WITH ILEORECTAL ANASTOMOSIS;  Surgeon: Donnie Mesa, MD;  Location: [place];  Service: General;  Laterality: N/A;  . Flexible sigmoidoscopy N/A 11/07/2014    Procedure: FLEXIBLE SIGMOIDOSCOPY;  Surgeon: Jerene Bears, MD;  Location: [facility] ENDOSCOPY;  Service: Endoscopy;  Laterality: N/A;  . Gastric resection     Family History  Problem Relation Age of Onset  . Colon cancer Mother    Social History  Substance Use Topics  . Smoking status: Current Some Day Smoker -- 0.50 packs/day for 66 years    Types: Cigarettes  . Smokeless tobacco: Former Systems developer    Types: Snuff, Chew     Comment: 05/27/2013 "haven't used chew or snuff in ~ 30 yr"  . Alcohol Use: No     Comment: 05/27/2013 "none in years; never had problem w/it" - not in 30 years    Review of Systems  All other systems reviewed and are negative.     Allergies  Review of patient's allergies indicates no known allergies.  Home Medications   Prior to Admission medications  Medication Sig Start Date End Date Taking? Authorizing Provider  carvedilol (COREG) 6.25 MG tablet Take 1 tablet (6.25 mg total) by mouth 2 (two) times daily with a meal. Patient not taking: Reported on 04/11/2015 11/16/14   Velvet Bathe, MD   BP 141/81 mmHg  Pulse 100  Temp(Src) 98 F (36.7 C) (Oral)  Resp 16  SpO2 98% Physical Exam  Constitutional: He is oriented to person, place, and time. He appears well-developed.  Frail and chronically ill appearing  HENT:  Head: Normocephalic and atraumatic.  Cardiovascular: Normal rate and regular rhythm.   No murmur heard. Pulmonary/Chest: Effort normal and breath sounds normal. No respiratory distress.  Abdominal: Soft. There is no rebound and no  guarding.  Mild lower abdominal tenderness  Musculoskeletal:  Moderate edema and ecchymosis to the left elbow. 2+ radial pulses. 5 out of 5 strength in bilateral hands. Able to flex and extend all digits in the left upper extremity. Sensation to light touch intact throughout the hand. Multiple wounds of various stages of healing on bilateral upper extremities.  Neurological: He is alert and oriented to person, place, and time.  Skin: Skin is warm and dry.  Psychiatric: He has a normal mood and affect. His behavior is normal.  Nursing note and vitals reviewed.   ED Course  Procedures (including critical care time) SPLINT APPLICATION Date/Time: 123XX123 PM Authorized by: Quintella Reichert Consent: Verbal consent obtained. Risks and benefits: risks, benefits and alternatives were discussed Consent given by: patient Splint applied by: orthopedic technician Location details: LUE Splint type: posterior Supplies used: orthoglass Post-procedure: The splinted body part was neurovascularly unchanged following the procedure. Patient tolerance: Patient tolerated the procedure well with no immediate complications.    Labs Review Labs Reviewed - No data to display  Imaging Review Dg Elbow Complete Left  05/10/2015  CLINICAL DATA:  Acute left elbow pain after fall in shower at jail yesterday. Initial encounter. EXAM: LEFT ELBOW - COMPLETE 3+ VIEW COMPARISON:  None. FINDINGS: Severely displaced and and mildly comminuted fracture is seen involving the olecranon. This appears to be closed and posttraumatic. Distal humerus and radius appear normal. IMPRESSION: Severely displaced and mildly comminuted fracture is seen involving the olecranon. Electronically Signed   By: Marijo Conception, M.D.   On: 05/10/2015 11:52   Dg Shoulder Left  05/10/2015  CLINICAL DATA:  Acute left shoulder pain after fall in shower at jail yesterday. Initial encounter. EXAM: LEFT SHOULDER - 2+ VIEW COMPARISON:  None. FINDINGS:  There is no evidence of fracture or dislocation. There is no evidence of arthropathy or other focal bone abnormality. Soft tissues are unremarkable. IMPRESSION: Normal left shoulder. Electronically Signed   By: Marijo Conception, M.D.   On: 05/10/2015 11:50   I have personally reviewed and evaluated these images and lab results as part of my medical decision-making.   EKG Interpretation None      MDM   Final diagnoses:  Olecranon fracture, left, closed, initial encounter   Patient here for evaluation of left elbow injury. He has a displaced comminuted fracture on imaging. This injury is closed and he is well perfused distally with no clear neurologic deficits. Discussed with Dr. Tamera Punt with orthopedics. Plan to place an splint and have him follow-up in the office on Friday. Discussed home care as well as return precautions.     Quintella Reichert, MD 05/10/15 [phone]

## 2015-05-10 NOTE — ED Notes (Signed)
Pt to xray

## 2015-05-10 NOTE — Progress Notes (Signed)
Attempts to reach [name] office at [phone] but line busy

## 2015-05-10 NOTE — ED Notes (Signed)
Ortho at bedside.

## 2015-07-12 ENCOUNTER — Encounter (HOSPITAL_COMMUNITY): Payer: Self-pay | Admitting: Emergency Medicine

## 2015-07-12 ENCOUNTER — Emergency Department (HOSPITAL_COMMUNITY): Payer: Medicare Other

## 2015-07-12 ENCOUNTER — Emergency Department (HOSPITAL_COMMUNITY)
Admission: EM | Admit: 2015-07-12 | Discharge: 2015-07-13 | Disposition: A | Discharge status: Home / Self Care | Payer: Medicare Other | Attending: Emergency Medicine | Admitting: Emergency Medicine

## 2015-07-12 DIAGNOSIS — R0602 Shortness of breath: Secondary | ICD-10-CM | POA: Diagnosis not present

## 2015-07-12 DIAGNOSIS — I10 Essential (primary) hypertension: Secondary | ICD-10-CM | POA: Insufficient documentation

## 2015-07-12 DIAGNOSIS — R079 Chest pain, unspecified: Secondary | ICD-10-CM | POA: Diagnosis present

## 2015-07-12 DIAGNOSIS — Z79899 Other long term (current) drug therapy: Secondary | ICD-10-CM | POA: Insufficient documentation

## 2015-07-12 DIAGNOSIS — R062 Wheezing: Secondary | ICD-10-CM | POA: Insufficient documentation

## 2015-07-12 DIAGNOSIS — F17210 Nicotine dependence, cigarettes, uncomplicated: Secondary | ICD-10-CM | POA: Diagnosis not present

## 2015-07-12 DIAGNOSIS — Z7982 Long term (current) use of aspirin: Secondary | ICD-10-CM | POA: Insufficient documentation

## 2015-07-12 DIAGNOSIS — I4891 Unspecified atrial fibrillation: Secondary | ICD-10-CM | POA: Diagnosis not present

## 2015-07-12 DIAGNOSIS — J3489 Other specified disorders of nose and nasal sinuses: Secondary | ICD-10-CM | POA: Insufficient documentation

## 2015-07-12 DIAGNOSIS — Z8572 Personal history of non-Hodgkin lymphomas: Secondary | ICD-10-CM | POA: Insufficient documentation

## 2015-07-12 DIAGNOSIS — Z8639 Personal history of other endocrine, nutritional and metabolic disease: Secondary | ICD-10-CM | POA: Insufficient documentation

## 2015-07-12 DIAGNOSIS — Z950 Presence of cardiac pacemaker: Secondary | ICD-10-CM | POA: Diagnosis not present

## 2015-07-12 DIAGNOSIS — Z8719 Personal history of other diseases of the digestive system: Secondary | ICD-10-CM | POA: Insufficient documentation

## 2015-07-12 DIAGNOSIS — Z8739 Personal history of other diseases of the musculoskeletal system and connective tissue: Secondary | ICD-10-CM | POA: Diagnosis not present

## 2015-07-12 DIAGNOSIS — Z86018 Personal history of other benign neoplasm: Secondary | ICD-10-CM | POA: Diagnosis not present

## 2015-07-12 DIAGNOSIS — Z8659 Personal history of other mental and behavioral disorders: Secondary | ICD-10-CM | POA: Insufficient documentation

## 2015-07-12 DIAGNOSIS — Z87438 Personal history of other diseases of male genital organs: Secondary | ICD-10-CM | POA: Diagnosis not present

## 2015-07-12 DIAGNOSIS — R05 Cough: Secondary | ICD-10-CM | POA: Insufficient documentation

## 2015-07-12 LAB — CBC
HEMATOCRIT: 33.9 % — AB (ref 39.0–52.0)
HEMOGLOBIN: 10.8 g/dL — AB (ref 13.0–17.0)
MCH: 29.8 pg (ref 26.0–34.0)
MCHC: 31.9 g/dL (ref 30.0–36.0)
MCV: 93.6 fL (ref 78.0–100.0)
Platelets: 136 10*3/uL — ABNORMAL LOW (ref 150–400)
RBC: 3.62 MIL/uL — ABNORMAL LOW (ref 4.22–5.81)
RDW: 14.3 % (ref 11.5–15.5)
WBC: 6 10*3/uL (ref 4.0–10.5)

## 2015-07-12 MED ORDER — ALBUTEROL (5 MG/ML) CONTINUOUS INHALATION SOLN
10.0000 mg/h | INHALATION_SOLUTION | RESPIRATORY_TRACT | Status: DC
  Administered 2015-07-12: 10 mg/h via RESPIRATORY_TRACT
  Filled 2015-07-12 (×2): qty 20

## 2015-07-12 MED ORDER — MAGNESIUM SULFATE 2 GM/50ML IV SOLN
2.0000 g | Freq: Once | INTRAVENOUS | Status: AC
  Administered 2015-07-12: 2 g via INTRAVENOUS
  Filled 2015-07-12: qty 50

## 2015-07-12 MED ORDER — IPRATROPIUM BROMIDE 0.02 % IN SOLN
1.0000 mg | Freq: Once | RESPIRATORY_TRACT | Status: AC
  Administered 2015-07-12: 1 mg via RESPIRATORY_TRACT
  Filled 2015-07-12: qty 5

## 2015-07-12 MED ORDER — KETOROLAC TROMETHAMINE 60 MG/2ML IM SOLN
60.0000 mg | Freq: Once | INTRAMUSCULAR | Status: DC
  Filled 2015-07-12: qty 2

## 2015-07-12 MED ORDER — KETOROLAC TROMETHAMINE 30 MG/ML IJ SOLN
30.0000 mg | Freq: Once | INTRAMUSCULAR | Status: AC
  Administered 2015-07-12: 30 mg via INTRAVENOUS
  Filled 2015-07-12: qty 1

## 2015-07-12 MED ORDER — PREDNISONE 20 MG PO TABS
60.0000 mg | ORAL_TABLET | Freq: Once | ORAL | Status: AC
  Administered 2015-07-12: 60 mg via ORAL
  Filled 2015-07-12: qty 3

## 2015-07-12 NOTE — ED Notes (Signed)
Per EMS, pt recently released from jail, c/o 10/10 chest pain. 324 asa given. Pt states his "pacemaker is acting up." Per EMS, pt non-compliant with meds. BP-116/54, HR-106 irregular

## 2015-07-12 NOTE — ED Provider Notes (Signed)
CSN: [id number]     Arrival date & time 07/12/15  2153 History  By signing my name below, I, Evelene Croon, attest that this documentation has been prepared under the direction and in the presence of Everlene Balls, MD . Electronically Signed: Evelene Croon, Scribe. 07/12/2015. 11:32 PM.      Chief Complaint  Patient presents with  . Chest Pain   The history is provided by the patient. No language interpreter was used.    HPI Comments:  [name] is a 70 y.o. male who presents to the Emergency Department complaining of central/left sided non-radiating sharp CP that worsened today. His pain is exacerbated with inspiration. He reports a h/o same pain but denies h/o MI. Pt reports associated SOB, productive cough with yellow sputum and rhinorrhea. He denies vomiting and diaphoresis. He also denies h/o PE/DVT and recent long periods of immobilization. Pt smokes ~ 10 cigarettes a day. He was given 324 ASA PTA with little relief.   Past Medical History  Diagnosis Date  . Depression   . Hypomagnesemia   . Fall at nursing home 05/27/2013    "slipped on water; said I broke my right hip" (05/27/2013)  . Migraine     "used to have them bad; haven't had one in awhile" (05/27/2013)  . Gout attack     "not too long ago; in my right foot" (05/27/2013)  . Complete heart block ([place])     reason for pacemaker/notes 05/27/2013  . Atrial fibrillation ([place])     [name] 05/27/2013  . HLD (hyperlipidemia)   . GIB (gastrointestinal bleeding)   . BPH (benign prostatic hyperplasia)   . Presence of permanent cardiac pacemaker   . Hypertension   . Gastric ulcer   . Non Hodgkin's lymphoma ([place])   . Tubular adenoma of colon    Past Surgical History  Procedure Laterality Date  . Insert / replace / remove pacemaker    . Appendectomy    . Cholecystectomy    . Open reduction of hip Right 05/28/2013    Procedure: OPEN REDUCTION OF HIP;  Surgeon: Renette Butters, MD;  Location: [place];  Service: Orthopedics;   Laterality: Right;  . Esophagogastroduodenoscopy (egd) with propofol N/A 09/26/2014    Procedure: ESOPHAGOGASTRODUODENOSCOPY (EGD) WITH PROPOFOL;  Surgeon: Jerene Bears, MD;  Location: [facility] ENDOSCOPY;  Service: Endoscopy;  Laterality: N/A;  . Colonoscopy N/A 10/01/2014    Procedure: COLONOSCOPY;  Surgeon: Jerene Bears, MD;  Location: [facility] ENDOSCOPY;  Service: Endoscopy;  Laterality: N/A;  . Partial colectomy N/A 10/05/2014    Procedure: SUBTOTAL COLECTOMY WITH ILEORECTAL ANASTOMOSIS;  Surgeon: Donnie Mesa, MD;  Location: [place];  Service: General;  Laterality: N/A;  . Flexible sigmoidoscopy N/A 11/07/2014    Procedure: FLEXIBLE SIGMOIDOSCOPY;  Surgeon: Jerene Bears, MD;  Location: [facility] ENDOSCOPY;  Service: Endoscopy;  Laterality: N/A;  . Gastric resection     Family History  Problem Relation Age of Onset  . Colon cancer Mother    Social History  Substance Use Topics  . Smoking status: Current Some Day Smoker -- 0.50 packs/day for 66 years    Types: Cigarettes  . Smokeless tobacco: Former Systems developer    Types: Snuff, Chew     Comment: 05/27/2013 "haven't used chew or snuff in ~ 30 yr"  . Alcohol Use: No     Comment: 05/27/2013 "none in years; never had problem w/it" - not in 30 years    Review of Systems  10 systems reviewed  and all are negative for acute change except as noted in the HPI.  Allergies  Review of patient's allergies indicates no known allergies.  Home Medications   Prior to Admission medications   Medication Sig Start Date End Date Taking? Authorizing Provider  albuterol (PROVENTIL HFA;VENTOLIN HFA) 108 (90 Base) MCG/ACT inhaler Inhale 1 puff into the lungs every 6 (six) hours as needed for wheezing or shortness of breath.   Yes Historical Provider, MD  aspirin EC 81 MG tablet Take 81 mg by mouth daily.   Yes Historical Provider, MD  carvedilol (COREG) 6.25 MG tablet Take 1 tablet (6.25 mg total) by mouth 2 (two) times daily with a meal. 11/16/14  Yes Velvet Bathe, MD   BP 113/70  mmHg  Pulse 81  Temp(Src) 98.4 F (36.9 C) (Oral)  Resp 22  SpO2 99% Physical Exam  Constitutional: He is oriented to person, place, and time. Vital signs are normal. He appears well-developed and well-nourished.  Non-toxic appearance. He does not appear ill. No distress.  Disheveled appearing  HENT:  Head: Normocephalic and atraumatic.  Nose: Nose normal.  Mouth/Throat: Oropharynx is clear and moist. No oropharyngeal exudate.  Eyes: Conjunctivae and EOM are normal. Pupils are equal, round, and reactive to light. No scleral icterus.  Neck: Normal range of motion. Neck supple. No tracheal deviation, no edema, no erythema and normal range of motion present. No thyroid mass and no thyromegaly present.  Cardiovascular: Normal rate, S1 normal, S2 normal, normal heart sounds, intact distal pulses and normal pulses.  An irregularly irregular rhythm present. Exam reveals no gallop and no friction rub.   No murmur heard. Pulmonary/Chest: Effort normal. No respiratory distress. He has wheezes (expiratory wheezes bilaterally). He has no rhonchi. He has no rales.  Abdominal: Soft. Normal appearance and bowel sounds are normal. He exhibits no distension, no ascites and no mass. There is no hepatosplenomegaly. There is no tenderness. There is no rebound, no guarding and no CVA tenderness.  Musculoskeletal: Normal range of motion. He exhibits no edema or tenderness.  Lymphadenopathy:    He has no cervical adenopathy.  Neurological: He is alert and oriented to person, place, and time. He has normal strength. No cranial nerve deficit or sensory deficit.  Skin: Skin is warm, dry and intact. No petechiae and no rash noted. He is not diaphoretic. No erythema. No pallor.  Psychiatric: He has a normal mood and affect. His behavior is normal. Judgment normal.  Nursing note and vitals reviewed.   ED Course  Procedures DIAGNOSTIC STUDIES:  Oxygen Saturation is 99% on RA, normal by my interpretation.     COORDINATION OF CARE:  11:07 PM Will order breathing treatment and CXR. Discussed treatment plan with pt at bedside and pt agreed to plan.  Labs Review Labs Reviewed  BASIC METABOLIC PANEL - Abnormal; Notable for the following:    Glucose, Bld 112 (*)    BUN 26 (*)    All other components within normal limits  CBC - Abnormal; Notable for the following:    RBC 3.62 (*)    Hemoglobin 10.8 (*)    HCT 33.9 (*)    Platelets 136 (*)    All other components within normal limits    Imaging Review Dg Chest 2 View  07/12/2015  CLINICAL DATA:  Left-sided non radiating chest pain worsening today. EXAM: CHEST  2 VIEW COMPARISON:  04/13/2015 FINDINGS: Dual-chamber pacer from the right with leads in unchanged position. Normal heart size and mediastinal contours. There  is no edema, consolidation, effusion, or pneumothorax. No osseous findings to explain acute chest pain IMPRESSION: No acute finding Electronically Signed   By: Monte Fantasia M.D.   On: 07/12/2015 23:39   I have personally reviewed and evaluated these images and lab results as part of my medical decision-making.   EKG Interpretation None      MDM   Final diagnoses:  None   Patient presents to the ED for CP and SOB.  His history is not consistent with ACS or PE.  Patient is wheezing on exam as well.  He was given albuterol, ipratropium, and prednisone.  Also given toradol for pain control.  CXR pending for possible pneumonia.  00:04am - CXR is neg for pneumonia.  Patient's wheezing and subjective breathing has now improved after treatment.  Given albuterol inhaler to use at home.  DC with prednisone course and PCP fu.  He appears well and in NAD.  VS remain within his normal limits and he is safe for DC.    I personally performed the services described in this documentation, which was scribed in my presence. The recorded information has been reviewed and is accurate.      Everlene Balls, MD 07/13/15 0140

## 2015-07-12 NOTE — ED Notes (Signed)
ECG obtained & given to Roxanne Mins, MD.

## 2015-07-13 DIAGNOSIS — R0602 Shortness of breath: Secondary | ICD-10-CM | POA: Diagnosis not present

## 2015-07-13 LAB — BASIC METABOLIC PANEL
Anion gap: 12 (ref 5–15)
BUN: 26 mg/dL — AB (ref 6–20)
CHLORIDE: 102 mmol/L (ref 101–111)
CO2: 25 mmol/L (ref 22–32)
CREATININE: 0.91 mg/dL (ref 0.61–1.24)
Calcium: 9.1 mg/dL (ref 8.9–10.3)
GFR calc Af Amer: >60 mL/min (ref >60)
GFR calc non Af Amer: >60 mL/min (ref >60)
GLUCOSE: 112 mg/dL — AB (ref 65–99)
POTASSIUM: 4.1 mmol/L (ref 3.5–5.1)
Sodium: 139 mmol/L (ref 135–145)

## 2015-07-13 MED ORDER — PREDNISONE 20 MG PO TABS
60.0000 mg | ORAL_TABLET | Freq: Every day | ORAL | Status: DC
Start: 2015-07-13 — End: 2015-08-07

## 2015-07-13 MED ORDER — ALBUTEROL SULFATE HFA 108 (90 BASE) MCG/ACT IN AERS
2.0000 | INHALATION_SPRAY | Freq: Once | RESPIRATORY_TRACT | Status: AC
  Administered 2015-07-13: 2 via RESPIRATORY_TRACT
  Filled 2015-07-13: qty 6.7

## 2015-07-13 NOTE — Discharge Instructions (Signed)
Shortness of Breath [name], use albuterol inhaler every 6 hours for the next 2 days.  Take prednisone as prescribed.  See your primary care doctor within 3 days for close follow up.  If symptoms worsen, come back to the ED immediately.  Thank you. Shortness of breath means you have trouble breathing. Shortness of breath needs medical care right away. HOME CARE   Do not smoke.  Avoid being around chemicals or things (paint fumes, dust) that may bother your breathing.  Rest as needed. Slowly begin your normal activities.  Only take medicines as told by your doctor.  Keep all doctor visits as told. GET HELP RIGHT AWAY IF:   Your shortness of breath gets worse.  You feel lightheaded, pass out (faint), or have a cough that is not helped by medicine.  You cough up blood.  You have pain with breathing.  You have pain in your chest, arms, shoulders, or belly (abdomen).  You have a fever.  You cannot walk up stairs or exercise the way you normally do.  You do not get better in the time expected.  You have a hard time doing normal activities even with rest.  You have problems with your medicines.  You have any new symptoms. MAKE SURE YOU:  Understand these instructions.  Will watch your condition.  Will get help right away if you are not doing well or get worse.   This information is not intended to replace advice given to you by your health care provider. Make sure you discuss any questions you have with your health care provider.   Document Released: 11/13/2007 Document Revised: 06/01/2013 Document Reviewed: 08/12/2011 Elsevier Interactive Patient Education [employer].

## 2015-07-13 NOTE — ED Notes (Signed)
Pt departed in NAD.  

## 2015-07-14 ENCOUNTER — Encounter (HOSPITAL_COMMUNITY): Payer: Self-pay | Admitting: Emergency Medicine

## 2015-07-14 ENCOUNTER — Emergency Department (HOSPITAL_COMMUNITY)
Admission: EM | Admit: 2015-07-14 | Discharge: 2015-07-14 | Disposition: A | Discharge status: Home / Self Care | Payer: Medicare Other | Attending: Emergency Medicine | Admitting: Emergency Medicine

## 2015-07-14 DIAGNOSIS — M109 Gout, unspecified: Secondary | ICD-10-CM | POA: Diagnosis not present

## 2015-07-14 DIAGNOSIS — Z950 Presence of cardiac pacemaker: Secondary | ICD-10-CM | POA: Insufficient documentation

## 2015-07-14 DIAGNOSIS — Z8659 Personal history of other mental and behavioral disorders: Secondary | ICD-10-CM | POA: Insufficient documentation

## 2015-07-14 DIAGNOSIS — Z8639 Personal history of other endocrine, nutritional and metabolic disease: Secondary | ICD-10-CM | POA: Diagnosis not present

## 2015-07-14 DIAGNOSIS — Z87438 Personal history of other diseases of male genital organs: Secondary | ICD-10-CM | POA: Diagnosis not present

## 2015-07-14 DIAGNOSIS — M25561 Pain in right knee: Secondary | ICD-10-CM | POA: Diagnosis not present

## 2015-07-14 DIAGNOSIS — Z79899 Other long term (current) drug therapy: Secondary | ICD-10-CM | POA: Diagnosis not present

## 2015-07-14 DIAGNOSIS — F17210 Nicotine dependence, cigarettes, uncomplicated: Secondary | ICD-10-CM | POA: Diagnosis not present

## 2015-07-14 DIAGNOSIS — Z8719 Personal history of other diseases of the digestive system: Secondary | ICD-10-CM | POA: Diagnosis not present

## 2015-07-14 DIAGNOSIS — Z86018 Personal history of other benign neoplasm: Secondary | ICD-10-CM | POA: Diagnosis not present

## 2015-07-14 DIAGNOSIS — Z8572 Personal history of non-Hodgkin lymphomas: Secondary | ICD-10-CM | POA: Diagnosis not present

## 2015-07-14 DIAGNOSIS — Z7982 Long term (current) use of aspirin: Secondary | ICD-10-CM | POA: Diagnosis not present

## 2015-07-14 DIAGNOSIS — Z7952 Long term (current) use of systemic steroids: Secondary | ICD-10-CM | POA: Diagnosis not present

## 2015-07-14 DIAGNOSIS — Z9181 History of falling: Secondary | ICD-10-CM | POA: Diagnosis not present

## 2015-07-14 DIAGNOSIS — I10 Essential (primary) hypertension: Secondary | ICD-10-CM | POA: Diagnosis not present

## 2015-07-14 MED ORDER — TRAMADOL HCL 50 MG PO TABS
50.0000 mg | ORAL_TABLET | Freq: Once | ORAL | Status: AC
  Administered 2015-07-14: 50 mg via ORAL
  Filled 2015-07-14: qty 1

## 2015-07-14 MED ORDER — TRAMADOL HCL 50 MG PO TABS: 50.0000 mg | ORAL_TABLET | Freq: Four times a day (QID) | ORAL | Status: DC | PRN

## 2015-07-14 NOTE — ED Notes (Signed)
See providers assessment.  

## 2015-07-14 NOTE — ED Notes (Signed)
With [place] pain, denies any new injury. Pt alert and oriented.

## 2015-07-14 NOTE — Discharge Instructions (Signed)

## 2015-07-14 NOTE — ED Provider Notes (Signed)
CSN: [id number]     Arrival date & time 07/14/15  2152 History  By signing my name below, I, [facility], attest that this documentation has been prepared under the direction and in the presence of Etta Quill, NP. Electronically Signed: Virgel Bouquet, ED Scribe. 07/14/2015. 10:53 PM.   Chief Complaint  Patient presents with  . Knee Pain   The history is provided by the patient and medical records. No language interpreter was used.   HPI Comments: [name] is a 70 y.o. male with an hx of HTN, HLN, who presents to the Emergency Department complaining of constant, mild, worse with bearing weight, right knee pain. Patient reports that he has been staying at [facility] to keep out of the cold. Pain is worse with cold weather. He has taken Vicodin, last dose this morning, without relief. Per medical records, pt has been seen frequently in the ED in the past 6 months, most recently 2 days ago. He denies recent injuries or falls. He denies any other symptoms currently.  Past Medical History  Diagnosis Date  . Depression   . Hypomagnesemia   . Fall at nursing home 05/27/2013    "slipped on water; said I broke my right hip" (05/27/2013)  . Migraine     "used to have them bad; haven't had one in awhile" (05/27/2013)  . Gout attack     "not too long ago; in my right foot" (05/27/2013)  . Complete heart block ([place])     reason for pacemaker/notes 05/27/2013  . Atrial fibrillation ([place])     [name] 05/27/2013  . HLD (hyperlipidemia)   . GIB (gastrointestinal bleeding)   . BPH (benign prostatic hyperplasia)   . Presence of permanent cardiac pacemaker   . Hypertension   . Gastric ulcer   . Non Hodgkin's lymphoma ([place])   . Tubular adenoma of colon    Past Surgical History  Procedure Laterality Date  . Insert / replace / remove pacemaker    . Appendectomy    . Cholecystectomy    . Open reduction of hip Right 05/28/2013    Procedure: OPEN REDUCTION OF HIP;  Surgeon:  Renette Butters, MD;  Location: [facility];  Service: Orthopedics;  Laterality: Right;  . Esophagogastroduodenoscopy (egd) with propofol N/A 09/26/2014    Procedure: ESOPHAGOGASTRODUODENOSCOPY (EGD) WITH PROPOFOL;  Surgeon: Jerene Bears, MD;  [facility] END[facility]COPY;  Service: Endoscopy;  Laterality: N/A;  . Colonoscopy N/A 10/01/2014    Procedure: COLONOSCOPY;  Surgeon: Jerene Bears, MD;  Location: [facility] ENDOSCOPY;  Service: Endoscopy;  Laterality: N/A;  . Partial colectomy N/A 10/05/2014    Procedure: SUBTOTAL COLECTOMY WITH ILEORECTAL ANASTOMOSIS;  Surgeon: Donnie Mesa, MD;  Location: [facility];  Service: General;  Laterality: N/A;  . Flexible sigmoidoscopy N/A 11/07/2014    Procedure: FLEXIBLE SIGMOIDOSCOPY;  Surgeon: Jerene Bears, MD;  Location[facility] ENDOSCOPY;  Service: Endoscopy;  Laterality: N/A;  . Gastric resection     Family History  Problem Relation Age of Onset  . Colon cancer Mother    Social History  Substance Use Topics  . Smoking status: Current Some Day Smoker -- 0.50 packs/day for 66 years    Types: Cigarettes  . Smokeless tobacco: Former Systems developer    Types: Snuff, Chew     Comment: 05/27/2013 "haven't used chew or snuff in ~ 30 yr"  . Alcohol Use: No     Comment: 05/27/2013 "none in years; never had problem w/it" - not in 30 years  Review of Systems  Musculoskeletal: Positive for arthralgias (Right knee).  All other systems reviewed and are negative.     Allergies  Review of patient's allergies indicates no known allergies.  Home Medications   Prior to Admission medications   Medication Sig Start Date End Date Taking? Authorizing Provider  albuterol (PROVENTIL HFA;VENTOLIN HFA) 108 (90 Base) MCG/ACT inhaler Inhale 1 puff into the lungs every 6 (six) hours as needed for wheezing or shortness of breath.    Historical Provider, MD  aspirin EC 81 MG tablet Take 81 mg by mouth daily.    Historical Provider, MD  carvedilol (COREG) 6.25 MG tablet Take 1 tablet (6.25 mg total) by  mouth 2 (two) times daily with a meal. 11/16/14   Velvet Bathe, MD  predniSONE (DELTASONE) 20 MG tablet Take 3 tablets (60 mg total) by mouth daily. 07/13/15   Everlene Balls, MD  traMADol (ULTRAM) 50 MG tablet Take 1 tablet (50 mg total) by mouth every 6 (six) hours as needed. 07/14/15   Etta Quill, NP   BP 132/86 mmHg  Pulse 82  Resp 20  SpO2 100% Physical Exam  Constitutional: He is oriented to person, place, and time. He appears well-developed and well-nourished. No distress.  HENT:  Head: Normocephalic and atraumatic.  Eyes: Conjunctivae and EOM are normal.  Neck: Neck supple. No tracheal deviation present.  Cardiovascular: Normal rate.   Pulmonary/Chest: Effort normal. No respiratory distress.  Musculoskeletal: Normal range of motion.  Good ROM of right knee without pain. Not hot to the touch with no signs of sepsis.  Neurological: He is alert and oriented to person, place, and time.  Skin: Skin is warm and dry.  Psychiatric: He has a normal mood and affect. His behavior is normal.  Nursing note and vitals reviewed.   ED Course  Procedures   DIAGNOSTIC STUDIES: Oxygen Saturation is 100% on RA, normal by my interpretation.    COORDINATION OF CARE: 10:45 PM Will order and prescribe Ultram. Discussed treatment plan with pt at bedside and pt agreed to plan.  Labs Review Labs Reviewed - No data to display  Imaging Review  I have personally reviewed and evaluated these images and lab results as part of my medical decision-making.   EKG Interpretation None      MDM   Final diagnoses:  Right knee pain   No swelling, increased warmth, or joint instability on exam. No known injury. Analgesic. Care instructions provided. Return precautions discussed. Follow-up with PCP.  I personally performed the services described in this documentation, which was scribed in my presence. The recorded information has been reviewed and is accurate.  Etta Quill, NP 07/15/15 0210  Charlesetta Shanks, MD 07/16/15 2330

## 2015-07-15 ENCOUNTER — Encounter (HOSPITAL_COMMUNITY): Payer: Self-pay

## 2015-07-15 ENCOUNTER — Emergency Department (HOSPITAL_COMMUNITY)
Admission: EM | Admit: 2015-07-15 | Discharge: 2015-07-15 | Disposition: A | Discharge status: Home / Self Care | Payer: Medicare Other | Attending: Emergency Medicine | Admitting: Emergency Medicine

## 2015-07-15 ENCOUNTER — Emergency Department (HOSPITAL_COMMUNITY): Payer: Medicare Other

## 2015-07-15 DIAGNOSIS — Z79899 Other long term (current) drug therapy: Secondary | ICD-10-CM | POA: Insufficient documentation

## 2015-07-15 DIAGNOSIS — Z87438 Personal history of other diseases of male genital organs: Secondary | ICD-10-CM | POA: Diagnosis not present

## 2015-07-15 DIAGNOSIS — I10 Essential (primary) hypertension: Secondary | ICD-10-CM | POA: Insufficient documentation

## 2015-07-15 DIAGNOSIS — I482 Chronic atrial fibrillation: Secondary | ICD-10-CM | POA: Insufficient documentation

## 2015-07-15 DIAGNOSIS — Z8719 Personal history of other diseases of the digestive system: Secondary | ICD-10-CM | POA: Diagnosis not present

## 2015-07-15 DIAGNOSIS — Z8639 Personal history of other endocrine, nutritional and metabolic disease: Secondary | ICD-10-CM | POA: Diagnosis not present

## 2015-07-15 DIAGNOSIS — Z950 Presence of cardiac pacemaker: Secondary | ICD-10-CM | POA: Diagnosis not present

## 2015-07-15 DIAGNOSIS — Z7952 Long term (current) use of systemic steroids: Secondary | ICD-10-CM | POA: Diagnosis not present

## 2015-07-15 DIAGNOSIS — Z86010 Personal history of colonic polyps: Secondary | ICD-10-CM | POA: Diagnosis not present

## 2015-07-15 DIAGNOSIS — Z8572 Personal history of non-Hodgkin lymphomas: Secondary | ICD-10-CM | POA: Diagnosis not present

## 2015-07-15 DIAGNOSIS — F329 Major depressive disorder, single episode, unspecified: Secondary | ICD-10-CM | POA: Insufficient documentation

## 2015-07-15 DIAGNOSIS — R079 Chest pain, unspecified: Secondary | ICD-10-CM | POA: Diagnosis present

## 2015-07-15 DIAGNOSIS — Z7982 Long term (current) use of aspirin: Secondary | ICD-10-CM | POA: Diagnosis not present

## 2015-07-15 DIAGNOSIS — R0789 Other chest pain: Secondary | ICD-10-CM

## 2015-07-15 DIAGNOSIS — F17210 Nicotine dependence, cigarettes, uncomplicated: Secondary | ICD-10-CM | POA: Insufficient documentation

## 2015-07-15 DIAGNOSIS — R0602 Shortness of breath: Secondary | ICD-10-CM | POA: Insufficient documentation

## 2015-07-15 DIAGNOSIS — F22 Delusional disorders: Secondary | ICD-10-CM | POA: Insufficient documentation

## 2015-07-15 DIAGNOSIS — R062 Wheezing: Secondary | ICD-10-CM | POA: Diagnosis not present

## 2015-07-15 DIAGNOSIS — Z87828 Personal history of other (healed) physical injury and trauma: Secondary | ICD-10-CM | POA: Insufficient documentation

## 2015-07-15 DIAGNOSIS — M109 Gout, unspecified: Secondary | ICD-10-CM | POA: Diagnosis not present

## 2015-07-15 LAB — RAPID URINE DRUG SCREEN, HOSP PERFORMED
Amphetamines: NOT DETECTED
Barbiturates: NOT DETECTED
Benzodiazepines: NOT DETECTED
COCAINE: NOT DETECTED
OPIATES: NOT DETECTED
Tetrahydrocannabinol: NOT DETECTED

## 2015-07-15 LAB — CBC WITH DIFFERENTIAL/PLATELET
BASOS ABS: 0 10*3/uL (ref 0.0–0.1)
BASOS PCT: 0 %
EOS PCT: 3 %
Eosinophils Absolute: 0.2 10*3/uL (ref 0.0–0.7)
HCT: 33.4 % — ABNORMAL LOW (ref 39.0–52.0)
Hemoglobin: 10.5 g/dL — ABNORMAL LOW (ref 13.0–17.0)
LYMPHS PCT: 35 %
Lymphs Abs: 1.9 10*3/uL (ref 0.7–4.0)
MCH: 29.9 pg (ref 26.0–34.0)
MCHC: 31.4 g/dL (ref 30.0–36.0)
MCV: 95.2 fL (ref 78.0–100.0)
MONO ABS: 0.3 10*3/uL (ref 0.1–1.0)
Monocytes Relative: 6 %
NEUTROS ABS: 3 10*3/uL (ref 1.7–7.7)
Neutrophils Relative %: 56 %
PLATELETS: 125 10*3/uL — AB (ref 150–400)
RBC: 3.51 MIL/uL — AB (ref 4.22–5.81)
RDW: 14.7 % (ref 11.5–15.5)
WBC: 5.3 10*3/uL (ref 4.0–10.5)

## 2015-07-15 LAB — COMPREHENSIVE METABOLIC PANEL
ALT: 16 U/L — ABNORMAL LOW (ref 17–63)
ANION GAP: 12 (ref 5–15)
AST: 19 U/L (ref 15–41)
Albumin: 3.3 g/dL — ABNORMAL LOW (ref 3.5–5.0)
Alkaline Phosphatase: 98 U/L (ref 38–126)
BUN: 21 mg/dL — ABNORMAL HIGH (ref 6–20)
CHLORIDE: 103 mmol/L (ref 101–111)
CO2: 25 mmol/L (ref 22–32)
Calcium: 9 mg/dL (ref 8.9–10.3)
Creatinine, Ser: 0.86 mg/dL (ref 0.61–1.24)
Glucose, Bld: 138 mg/dL — ABNORMAL HIGH (ref 65–99)
POTASSIUM: 3.9 mmol/L (ref 3.5–5.1)
SODIUM: 140 mmol/L (ref 135–145)
Total Bilirubin: 0.5 mg/dL (ref 0.3–1.2)
Total Protein: 6.4 g/dL — ABNORMAL LOW (ref 6.5–8.1)

## 2015-07-15 LAB — BRAIN NATRIURETIC PEPTIDE: B NATRIURETIC PEPTIDE 5: 183.8 pg/mL — AB (ref 0.0–100.0)

## 2015-07-15 LAB — MAGNESIUM: MAGNESIUM: 1.8 mg/dL (ref 1.7–2.4)

## 2015-07-15 LAB — ETHANOL: Alcohol, Ethyl (B): <5 mg/dL (ref <5)

## 2015-07-15 LAB — I-STAT TROPONIN, ED
TROPONIN I, POC: 0 ng/mL (ref 0.00–0.08)
Troponin i, poc: 0 ng/mL (ref 0.00–0.08)

## 2015-07-15 MED ORDER — IPRATROPIUM-ALBUTEROL 0.5-2.5 (3) MG/3ML IN SOLN
3.0000 mL | Freq: Once | RESPIRATORY_TRACT | Status: AC
  Administered 2015-07-15: 3 mL via RESPIRATORY_TRACT
  Filled 2015-07-15: qty 3

## 2015-07-15 MED ORDER — PREDNISONE 20 MG PO TABS
60.0000 mg | ORAL_TABLET | Freq: Once | ORAL | Status: AC
  Administered 2015-07-15: 60 mg via ORAL
  Filled 2015-07-15: qty 3

## 2015-07-15 MED ORDER — ASPIRIN 81 MG PO CHEW: 324.0000 mg | CHEWABLE_TABLET | Freq: Once | ORAL | Status: AC

## 2015-07-15 MED ORDER — ALBUTEROL SULFATE HFA 108 (90 BASE) MCG/ACT IN AERS
2.0000 | INHALATION_SPRAY | Freq: Once | RESPIRATORY_TRACT | Status: AC
  Administered 2015-07-15: 2 via RESPIRATORY_TRACT
  Filled 2015-07-15: qty 6.7

## 2015-07-15 MED ORDER — NITROGLYCERIN 2 % TD OINT: 1.0000 [in_us] | TOPICAL_OINTMENT | Freq: Once | TRANSDERMAL | Status: DC

## 2015-07-15 NOTE — ED Notes (Signed)
Pt. Coming via [facility] for chest pain and SOB starting at 0400. Upon arrival EMS reports wheezing and gave 5mg  albuterol. Pt. sts pain is in left chest with  No radiation. Pt. Given 1 dose of nitro and 324 ASA. Pt. sts pain is still 10/10. Pt. Very delusional at this time and sts he has 20 million dollars, getting married tomorrow in [place], and flying his new private jet there. Pt. Physically looks dishevel and is wearing multiple layers of clothing. Pt. AOX4.

## 2015-07-15 NOTE — ED Notes (Signed)
Pacemaker interrogated at this time.

## 2015-07-15 NOTE — Discharge Instructions (Signed)
Do not hesitate to return to the emergency room for any new, worsening or concerning symptoms. ° °Please obtain primary care using resource guide below. Let them know that you were seen in the emergency room and that they will need to obtain records for further outpatient management. ° ° ° °Emergency Department Resource Guide °1) Find a Doctor and Pay Out of Pocket °Although you won't have to find out who is covered by your insurance plan, it is a good idea to ask around and get recommendations. You will then need to call the office and see if the doctor you have chosen will accept you as a new patient and what types of options they offer for patients who are self-pay. Some doctors offer discounts or will set up payment plans for their patients who do not have insurance, but you will need to ask so you aren't surprised when you get to your appointment. ° °2) Contact Your Local Health Department °Not all health departments have doctors that can see patients for sick visits, but many do, so it is worth a call to see if yours does. If you don't know where your local health department is, you can check in your phone book. The CDC also has a tool to help you locate your state's health department, and many state websites also have listings of all of their local health departments. ° °3) Find a Walk-in Clinic °If your illness is not likely to be very severe or complicated, you may want to try a walk in clinic. These are popping up all over the country in pharmacies, drugstores, and shopping centers. They're usually staffed by nurse practitioners or physician assistants that have been trained to treat common illnesses and complaints. They're usually fairly quick and inexpensive. However, if you have serious medical issues or chronic medical problems, these are probably not your best option. ° °No Primary Care Doctor: °- Call [facility] Connect at  832-8000 - they can help you locate a primary care doctor that  accepts your  insurance, provides certain services, etc. °- Physician Referral Service- 1-800-533-3463 ° °Chronic Pain Problems: °Organization         Address  Phone   Notes  °[facility]  (336) 297-2271 Patients need to be referred by their primary care doctor.  ° °Medication Assistance: °Organization         Address  Phone   Notes  °[place] [facility] [address] °[place], [place] [address] (336) 641-8030 --Must be a resident of [place] °-- Must have NO insurance coverage whatsoever (no Medicaid/ Medicare, etc.) °-- The pt. MUST have a primary care doctor that directs their care regularly and follows them in the community °  °[facility]  (866) 331-1348   °[employer]  (888) 892-1162   ° °Agencies that provide inexpensive medical care: °Organization         Address  Phone   Notes  °[facility]  (336) 832-8035   °[facility]    (336) 832-7272   °[facility] [address] °[place], [place] [address] (336) 832-4777   °[facility] [address], °[place] (336) 271-4999   °[facility]    (336) 373-0678   °[facility]    (336) 272-1050   °[facility] ° [address],  Phone:  (336) 832-4444, Fax:  (336) 832-4440 Hours of Operation:  9 am -   6 pm, M-F.  Also accepts Medicaid/Medicare and self-pay.  ° [facility] ° [address], [place] Phone: (336) 832-3150, Fax: (336) [phone]. Hours of Operation:  8:30 am - 5:30 pm, M-F.  Also accepts Medicaid and self-pay.  °[facility] [place] [address], [place] Phone: (336) 878-6027   °[facility] [address], [place], [place] (336)723-1848, Ext. 123 Mondays & Thursdays: 7-9 AM.  First 15 patients are seen on a first come, first serve basis. °  ° °Medicaid-accepting [place] Providers: ° °Organization          Address  Phone   Notes  °[facility] [address], [place] (336) 641-2100 Also accepts self-pay patients.  °[facility] [address], [place] ° (336) 856-9996   °[facility] [address], [place] (336) 288-8857   °[facility] [address] [place] [address], [place] (336) 299-7000   °Veita Bland [address], [place]  ° (336) 373-1557 Only accepts [place] Access Medicaid patients after they have their name applied to their card.  ° °Self-Pay (no insurance) in [place]: ° °Organization         Address  Phone   Notes  °Sickle Cell Patients, [facility] [address], [place] (336) 832-1970   °[facility] [address], [place] (336) 832-4400   °[facility] ° [address], [place] (336) 992-4800   °[facility]/Dr. Osei-Bonsu ° [address] [place] [address], [place] or [address], [place] (336) 841-8500 Phone number for both [place] and [place] locations is the same.  °[facility] [address], [place] (336) 299-0000   °[facility] [address] [place] [address], [place] or [address] (336) 852-7530 °(336) 878-2260   °[facility] [address], [place] (336) 350-1642, phone; (336) 294-5005, fax Sees patients 1st and 3rd Saturday of every month.  Must not qualify for public or private insurance (i.e. Medicaid, Medicare, [place] Health Choice, Veterans' Benefits) • Household income should be no more than 200% of the poverty level •The clinic cannot treat you if you are pregnant or think you are pregnant • Sexually transmitted diseases are not treated at the clinic.  ° ° °Dental Care: °Organization         Address  Phone  Notes  °[facility] [address],  [place] (336) 641-6152 Accepts children up to age 21 who are enrolled in Medicaid or [place] Health Choice; pregnant women with a Medicaid card; and children who have applied for Medicaid or [place] Health Choice, but were declined, whose parents can pay a reduced fee at time of service.  °[facility] [place]  [address], [place] (336) 641-7733 Accepts children up to age 21 who are enrolled in Medicaid or [place] Health Choice; pregnant women with a Medicaid card; and children who have applied for Medicaid or  Health Choice, but were declined, whose parents can pay a reduced fee at time of service.  °[facility] ° [address], [place] (336) 641-4533 Patients are seen by appointment only. Walk-ins are not accepted. [facility] will see patients 18 years of age and   older. °Monday - Tuesday (8am-5pm) °Most Wednesdays (8:30-5pm) °$30 per visit, cash only  °[facility] ° [address], [place] (336) 641-4533 Patients are seen by appointment only. Walk-ins are not accepted. [facility] will see patients 18 years of age and older. °One Wednesday Evening (Monthly: Volunteer Based).  $30 per visit, cash only  °[facility]  (919) 537-3737 for adults; Children under age 4, call Graduate Pediatric Dentistry at (919) [phone]. Children aged 4-14, please call (919) 537-3737 to request a pediatric application. ° Dental services are provided in all areas of dental care including fillings, crowns and bridges, complete and partial dentures, implants, gum treatment, root canals, and extractions. Preventive care is also provided. Treatment is provided to both adults and children. °Patients are selected via a lottery and there is often a waiting list. °  °[facility] [address], °[place] ° (336) 763-8833 [email or web address] °  °[facility] [address], [place], [place]  (336)723-1848, Ext. 123 Second and Fourth Thursday of each month, opens at 6:30 AM; Clinic ends at 9 AM.  Patients are seen on a first-come first-served basis, and a limited number are seen during each clinic.  ° °[facility] ° [address], [place], [place] (336) 723-7904   Eligibility Requirements °You must have lived in [place], [place], or [place] counties for at least the last three months. °  You cannot be eligible for state or federal sponsored healthcare insurance, including Veterans Administration, Medicaid, or Medicare. °  You generally cannot be eligible for healthcare insurance through your employer.  °  How to apply: °Eligibility screenings are held every Tuesday and Wednesday afternoon from 1:00 pm until 4:00 pm. You do not need an appointment for the interview!  °[facility] [address], [place], [place] 336-631-2330   °[facility]  336-342-8273   °[facility]  336-703-3100   °[facility]  336-570-6415   ° °Behavioral Health Resources in the Community: °Intensive Outpatient Programs °Organization         Address  Phone  Notes  °[facility] [address], [place], [place] 336-878-6098   °[facility] [address], [place], [place] 336-832-9800   °[facility] [address], [place], [place] ° 336-882-2125   °[facility] [address],  °[place], [place] 1-800-853-5163 or 336-641-4981   °Substance Abuse Resources °Organization         Address  Phone  Notes  °[facility]  336-882-2125   °[facility]  336-784-9470   °[facility]  336-285-9073   °[facility]  336-845-3988   °Residential & Outpatient Substance Abuse Program  1-800-659-3381   °Psychological Services °Organization         Address  Phone  Notes  °[facility]  336- 832-9600   °[facility]  336- 378-7881    °[facility] [address], [place] 1-800-853-5163 or 336-641-4981   ° °Mobile Crisis Teams °Organization         Address  Phone  Notes  °[facility], Mobile Crisis Care Unit  1-877-626-1772   °Assertive °[facility] ° [address] [place], [place] 336-834-9664   °Sharon DeEsch [address] °[place]  336-554-5454   ° °Self-Help/Support Groups °Organization         Address    Phone             Notes  °[facility] - variety of support groups  336- 373-1402 Call for more information  °Narcotics Anonymous (NA), [facility] [address], °[place]  2 meetings at this location  ° °Residential Treatment Programs °Organization         Address  Phone  Notes  °[facility] [address],    °[place]  1-866-801-8205   °[facility] ° [address], [place], [place] 704-293-8524   °[facility] [address], [place] 336-845-3988 Admissions: 8am-3pm M-F  °[facility] [address]    °[place], [place] 336-841-1104   °[facility] [address], [place], [place] 336-379-7146   °[facility] [address]  °[place], [place] 336-285-9073   °[facility] - Intensive Outpatient [address], [place], [place] 336-852-3033   °[facility] [address]  °[place], [place] 1-877-615-2722 or 336-784-9470   °[facility] [address] [place], [place] 336-227-7417 Accepts Medicaid  °[facility] [address]  °[place] 1-800-659-3381 Substance Abuse/Addiction Treatment  ° °[place] Behavioral Health Resources °Organization         Address  Phone  Notes  °[facility]  (888) 581-9988   °Julie Brannon, PhD [address] [place], [place]   (336) 349-5553 or (336) 951-0000   °[facility]   [address] °[place], [place] (336) 349-4454   °[facility] [address], [place], [place] (336) 342-8316 Insurance/Medicaid/sponsorship through [facility]  °[facility] [address]                                    [place], [place] (336) 342-8316 Therapy/tele-psych/case  °[facility] [address]  ° [place], [place] (336) 349-2233    °Dr. Arfeen  (336) 349-4544   °[facility] [place]  [employer] [place] [facility] 1) [address], [place] °2) [address], [place] °3)  [address], [place] (336) 349-3220 °(336) 342-7768 ° °(336) 342-8140   °[place] Child Abuse [facility] (336) 342-1394 or (336) 342-3537 (After Hours)    ° ° ° °

## 2015-07-15 NOTE — ED Provider Notes (Signed)
CSN: [id number]     Arrival date & time 07/15/15  1439 History   First MD Initiated Contact with Patient 07/15/15 1500     Chief Complaint  Patient presents with  . Chest Pain  . Shortness of Breath     (Consider location/radiation/quality/duration/timing/severity/associated sxs/prior Treatment) HPI  Blood pressure 122/57, pulse 78, temperature 98 F (36.7 C), SpO2 99 %.  [name] is a 70 y.o. male complaining of with history of atrial fibrillation, GI bleed complaining of sharp, left-sided, nonradiating chest pain with associated shortness of breath, productive cough of acute onset of 4 AM. States he has pain like this daily but is normally not this severe, he rates his pain at 10 out of 10, he was given full dose aspirin and nitroglycerin via EMS with little relief. Patient denies nausea, vomiting, diaphoresis, history of DVT, PE, recent immobilizations, active cancer, calf pain, leg swelling, palpitations, syncope. Patient states that he is getting married tomorrow in [place] and he is flying a private jet there. Patient does recall that he was seen in the ED yesterday for knee pain. States that he resides at a nursing home. He denies auditory or visual hallucinations, suicidal ideation, homicidal ideation states he drinks and smokes marijuana approximately one time per month.  Past Medical History  Diagnosis Date  . Depression   . Hypomagnesemia   . Fall at nursing home 05/27/2013    "slipped on water; said I broke my right hip" (05/27/2013)  . Migraine     "used to have them bad; haven't had one in awhile" (05/27/2013)  . Gout attack     "not too long ago; in my right foot" (05/27/2013)  . Complete heart block ([place])     reason for pacemaker/notes 05/27/2013  . Atrial fibrillation ([place])     [name] 05/27/2013  . HLD (hyperlipidemia)   . GIB (gastrointestinal bleeding)   . BPH (benign prostatic hyperplasia)   . Presence of permanent cardiac pacemaker   . Hypertension   .  Gastric ulcer   . Non Hodgkin's lymphoma ([place])   . Tubular adenoma of colon    Past Surgical History  Procedure Laterality Date  . Insert / replace / remove pacemaker    . Appendectomy    . Cholecystectomy    . Open reduction of hip Right 05/28/2013    Procedure: OPEN REDUCTION OF HIP;  Surgeon: Renette Butters, MD;  Location: [facility];  Service: Orthopedics;  Laterality: Right;  . Esophagogastroduodenoscopy (egd) with propofol N/A 09/26/2014    Procedure: ESOPHAGOGASTRODUODENOSCOPY (EGD) WITH PROPOFOL;  Surgeon: Jerene Bears, MD;  Location: [facility] ENDOSCOPY;  Service: Endoscopy;  Laterality: N/A;  . Colonoscopy N/A 10/01/2014    Procedure: COLONOSCOPY;  Surgeon: Jerene Bears, MD;  Location[facility] ENDOSCOPY;  Service: Endoscopy;  Laterality: N/A;  . Partial colectomy N/A 10/05/2014    Procedure: SUBTOTAL COLECTOMY WITH ILEORECTAL ANASTOMOSIS;  Surgeon: Donnie Mesa, MD;  Location: [facility];  Service: General;  Laterality: N/A;  . Flexible sigmoidoscopy N/A 11/07/2014    Procedure: FLEXIBLE SIGMOIDOSCOPY;  Surgeon: Jerene Bears, MD;  Location: [facility] ENDOSCOPY;  Service: Endoscopy;  Laterality: N/A;  . Gastric resection     Family History  Problem Relation Age of Onset  . Colon cancer Mother    Social History  Substance Use Topics  . Smoking status: Current Some Day Smoker -- 0.50 packs/day for 66 years    Types: Cigarettes  . Smokeless tobacco: Former Systems developer    Types:  Snuff, Chew     Comment: 05/27/2013 "haven't used chew or snuff in ~ 30 yr"  . Alcohol Use: No     Comment: 05/27/2013 "none in years; never had problem w/it" - not in 30 years    Review of Systems  10 systems reviewed and found to be negative, except as noted in the HPI.   Allergies  Review of patient's allergies indicates no known allergies.  Home Medications   Prior to Admission medications   Medication Sig Start Date End Date Taking? Authorizing Provider  albuterol (PROVENTIL HFA;VENTOLIN HFA) 108 (90 Base) MCG/ACT  inhaler Inhale 1 puff into the lungs every 6 (six) hours as needed for wheezing or shortness of breath.   Yes Historical Provider, MD  aspirin EC 81 MG tablet Take 81 mg by mouth daily.   Yes Historical Provider, MD  carvedilol (COREG) 6.25 MG tablet Take 1 tablet (6.25 mg total) by mouth 2 (two) times daily with a meal. 11/16/14   Velvet Bathe, MD  predniSONE (DELTASONE) 20 MG tablet Take 3 tablets (60 mg total) by mouth daily. 07/13/15   Everlene Balls, MD  traMADol (ULTRAM) 50 MG tablet Take 1 tablet (50 mg total) by mouth every 6 (six) hours as needed. 07/14/15   Etta Quill, NP   BP 128/66 mmHg  Pulse 104  Temp(Src) 98 F (36.7 C)  Resp 17  SpO2 95% Physical Exam  Constitutional: He is oriented to person, place, and time. He appears well-developed. No distress.  Poorly nourished slightly disheveled  HENT:  Head: Normocephalic.  Mouth/Throat: Oropharynx is clear and moist.  Eyes: Conjunctivae are normal.  Neck: Normal range of motion. No JVD present. No tracheal deviation present.  Cardiovascular: Intact distal pulses.   Normal rate, irregular rhythm    Radial pulse equal bilaterally  Pulmonary/Chest: Effort normal. No stridor. No respiratory distress. He has wheezes. He has no rales. He exhibits no tenderness.  Moderate scattered expiratory wheezing  Abdominal: Soft. He exhibits no distension and no mass. There is no tenderness. There is no rebound and no guarding.  Musculoskeletal: Normal range of motion. He exhibits no edema or tenderness.  No calf asymmetry, superficial collaterals, palpable cords, edema, Homans sign negative bilaterally.    Neurological: He is alert and oriented to person, place, and time.  Skin: Skin is warm. He is not diaphoretic.  Psychiatric:  Oriented 3, appears delusional  Nursing note and vitals reviewed.   ED Course  Procedures (including critical care time) Labs Review Labs Reviewed  CBC WITH DIFFERENTIAL/PLATELET - Abnormal; Notable for the  following:    RBC 3.51 (*)    Hemoglobin 10.5 (*)    HCT 33.4 (*)    Platelets 125 (*)    All other components within normal limits  BRAIN NATRIURETIC PEPTIDE - Abnormal; Notable for the following:    B Natriuretic Peptide 183.8 (*)    All other components within normal limits  COMPREHENSIVE METABOLIC PANEL - Abnormal; Notable for the following:    Glucose, Bld 138 (*)    BUN 21 (*)    Total Protein 6.4 (*)    Albumin 3.3 (*)    ALT 16 (*)    All other components within normal limits  MAGNESIUM  URINE RAPID DRUG SCREEN, HOSP PERFORMED  ETHANOL  I-STAT TROPOININ, ED  I-STAT TROPOININ, ED    Imaging Review Dg Chest 2 View  07/15/2015  CLINICAL DATA:  Chest pain and shortness of breath.  No radiation. EXAM: CHEST  2  VIEW COMPARISON:  07/12/2015 FINDINGS: The lungs are hyperinflated likely secondary to COPD. There is no focal parenchymal opacity. There is no pleural effusion or pneumothorax. The heart and mediastinal contours are unremarkable. There is a 2 lead cardiac pacemaker. The osseous structures are unremarkable. IMPRESSION: No active cardiopulmonary disease. Electronically Signed   By: Kathreen Devoid   On: 07/15/2015 16:07   I have personally reviewed and evaluated these images and lab results as part of my medical decision-making.   EKG Interpretation   Date/Time:  Saturday July 15 2015 14:45:50 EST Ventricular Rate:  83 PR Interval:    QRS Duration: 101 QT Interval:  392 QTC Calculation: 461 R Axis:   130 Text Interpretation:  Atrial fibrillation Ventricular premature complex  Right axis deviation No significant change since last tracing Confirmed by  Winfred Leeds  MD, SAM [phone]) on 07/15/2015 2:57:29 PM      MDM   Final diagnoses:  Atypical chest pain    Filed Vitals:   07/15/15 1446 07/15/15 1515 07/15/15 1730 07/15/15 1745  BP: 122/57 110/60 160/83 128/66  Pulse: 78 113  104  Temp: 98 F (36.7 C)     Resp:  16 24 17   SpO2: 99% 96% 100% 95%     Medications  nitroGLYCERIN (NITROGLYN) 2 % ointment 1 inch (0 inches Topical Hold 07/15/15 1555)  aspirin chewable tablet 324 mg (324 mg Oral Given by EMS 07/15/15 1531)  predniSONE (DELTASONE) tablet 60 mg (60 mg Oral Given 07/15/15 1619)  ipratropium-albuterol (DUONEB) 0.5-2.5 (3) MG/3ML nebulizer solution 3 mL (3 mLs Nebulization Given 07/15/15 1620)  albuterol (PROVENTIL HFA;VENTOLIN HFA) 108 (90 Base) MCG/ACT inhaler 2 puff (2 puffs Inhalation Given 07/15/15 1714)    [name] is 70 y.o. male presenting with past medical history significant for A. fib, pacemaker complaining of chest pain which he states he has daily but is worse today. EKG with no acute findings, troponin negative. Chest x-ray negative, blood work otherwise unremarkable. Patient states that his chest pain has completely resolved. His repeat troponin is negative. Patient states that he is flying to [place] to get married tomorrow. Does not appear to be a danger to himself or others despite these delusions.  Evaluation does not show pathology that would require ongoing emergent intervention or inpatient treatment. Pt is hemodynamically stable and mentating appropriately. Discussed findings and plan with patient/guardian, who agrees with care plan. All questions answered. Return precautions discussed and outpatient follow up given.        Monico Blitz, PA-C 07/15/15 2005  Blanchie Dessert, MD 07/17/15 [phone]

## 2015-07-16 ENCOUNTER — Emergency Department (HOSPITAL_COMMUNITY): Payer: Medicare Other

## 2015-07-16 ENCOUNTER — Encounter (HOSPITAL_COMMUNITY): Payer: Self-pay | Admitting: Emergency Medicine

## 2015-07-16 ENCOUNTER — Emergency Department (HOSPITAL_COMMUNITY)
Admission: EM | Admit: 2015-07-16 | Discharge: 2015-07-16 | Disposition: A | Discharge status: Home / Self Care | Payer: Medicare Other | Attending: Emergency Medicine | Admitting: Emergency Medicine

## 2015-07-16 DIAGNOSIS — Z7982 Long term (current) use of aspirin: Secondary | ICD-10-CM | POA: Insufficient documentation

## 2015-07-16 DIAGNOSIS — R109 Unspecified abdominal pain: Secondary | ICD-10-CM

## 2015-07-16 DIAGNOSIS — E785 Hyperlipidemia, unspecified: Secondary | ICD-10-CM | POA: Diagnosis not present

## 2015-07-16 DIAGNOSIS — I10 Essential (primary) hypertension: Secondary | ICD-10-CM | POA: Diagnosis not present

## 2015-07-16 DIAGNOSIS — F329 Major depressive disorder, single episode, unspecified: Secondary | ICD-10-CM | POA: Diagnosis not present

## 2015-07-16 DIAGNOSIS — Z79899 Other long term (current) drug therapy: Secondary | ICD-10-CM | POA: Insufficient documentation

## 2015-07-16 DIAGNOSIS — R1084 Generalized abdominal pain: Secondary | ICD-10-CM | POA: Diagnosis not present

## 2015-07-16 DIAGNOSIS — F17210 Nicotine dependence, cigarettes, uncomplicated: Secondary | ICD-10-CM | POA: Insufficient documentation

## 2015-07-16 DIAGNOSIS — K625 Hemorrhage of anus and rectum: Secondary | ICD-10-CM | POA: Diagnosis present

## 2015-07-16 DIAGNOSIS — G8929 Other chronic pain: Secondary | ICD-10-CM | POA: Insufficient documentation

## 2015-07-16 HISTORY — DX: Chronic atrial fibrillation, unspecified: I48.20

## 2015-07-16 HISTORY — DX: Personal history of other diseases of the nervous system and sense organs: Z86.69

## 2015-07-16 HISTORY — DX: Essential (primary) hypertension: I10

## 2015-07-16 LAB — COMPREHENSIVE METABOLIC PANEL
ALBUMIN: 3.7 g/dL (ref 3.5–5.0)
ALK PHOS: 94 U/L (ref 38–126)
ALT: 24 U/L (ref 17–63)
ANION GAP: 15 (ref 5–15)
AST: 28 U/L (ref 15–41)
BILIRUBIN TOTAL: 0.5 mg/dL (ref 0.3–1.2)
BUN: 24 mg/dL — AB (ref 6–20)
CALCIUM: 9.2 mg/dL (ref 8.9–10.3)
CO2: 21 mmol/L — ABNORMAL LOW (ref 22–32)
Chloride: 101 mmol/L (ref 101–111)
Creatinine, Ser: 0.79 mg/dL (ref 0.61–1.24)
GFR calc Af Amer: >60 mL/min (ref >60)
GLUCOSE: 120 mg/dL — AB (ref 65–99)
Potassium: 3.9 mmol/L (ref 3.5–5.1)
Sodium: 137 mmol/L (ref 135–145)
TOTAL PROTEIN: 7.2 g/dL (ref 6.5–8.1)

## 2015-07-16 LAB — TYPE AND SCREEN
ABO/RH(D): O POS
ANTIBODY SCREEN: NEGATIVE

## 2015-07-16 LAB — CBC
HCT: 35.8 % — ABNORMAL LOW (ref 39.0–52.0)
Hemoglobin: 11.4 g/dL — ABNORMAL LOW (ref 13.0–17.0)
MCH: 30.3 pg (ref 26.0–34.0)
MCHC: 31.8 g/dL (ref 30.0–36.0)
MCV: 95.2 fL (ref 78.0–100.0)
Platelets: 140 10*3/uL — ABNORMAL LOW (ref 150–400)
RBC: 3.76 MIL/uL — ABNORMAL LOW (ref 4.22–5.81)
RDW: 14.7 % (ref 11.5–15.5)
WBC: 6.7 10*3/uL (ref 4.0–10.5)

## 2015-07-16 LAB — LIPASE, BLOOD: Lipase: 42 U/L (ref 11–51)

## 2015-07-16 MED ORDER — IOHEXOL 300 MG/ML  SOLN
100.0000 mL | Freq: Once | INTRAMUSCULAR | Status: AC | PRN
  Administered 2015-07-16: 100 mL via INTRAVENOUS

## 2015-07-16 MED ORDER — POLYETHYLENE GLYCOL 3350 17 G PO PACK: 17.0000 g | PACK | Freq: Every day | ORAL | Status: DC

## 2015-07-16 MED ORDER — FLEET PEDIATRIC 3.5-9.5 GM/59ML RE ENEM: 1.0000 | ENEMA | Freq: Once | RECTAL | Status: DC

## 2015-07-16 NOTE — ED Notes (Signed)
Patient transported to CT 

## 2015-07-16 NOTE — ED Notes (Signed)
Pt states he started having bloody stools last night. Hx of same. Pt was here yesterday for chest pain. Pt is cooperative at this time.

## 2015-07-16 NOTE — ED Notes (Signed)
Pt refusing enema at this time. MD aware.

## 2015-07-16 NOTE — ED Provider Notes (Signed)
Pt signed out to me follow up CT for complaint of rectal bleeding, abdominal pain. CT shows impaction, lots of stool. Patient refusing disimpaction, miralax and enema here. T think he has capacity for this decision.  Requesting to eat.  We will feed patient.  Pt CT also shows vascular issue with right leg, Legs equally warm, patient has no pain unilaterally.    Will again encourage patient to follow up with PCP.   Afton Lavalle Julio Alm, MD 07/16/15 2028

## 2015-07-16 NOTE — Discharge Instructions (Signed)
You have lots of stool, please use Miralax and the enema provided to help with your constipation.   Abdominal Pain, Adult Many things can cause abdominal pain. Usually, abdominal pain is not caused by a disease and will improve without treatment. It can often be observed and treated at home. Your health care provider will do a physical exam and possibly order blood tests and X-rays to help determine the seriousness of your pain. However, in many cases, more time must pass before a clear cause of the pain can be found. Before that point, your health care provider may not know if you need more testing or further treatment. HOME CARE INSTRUCTIONS Monitor your abdominal pain for any changes. The following actions may help to alleviate any discomfort you are experiencing:  Only take over-the-counter or prescription medicines as directed by your health care provider.  Do not take laxatives unless directed to do so by your health care provider.  Try a clear liquid diet (broth, tea, or water) as directed by your health care provider. Slowly move to a bland diet as tolerated. SEEK MEDICAL CARE IF:  You have unexplained abdominal pain.  You have abdominal pain associated with nausea or diarrhea.  You have pain when you urinate or have a bowel movement.  You experience abdominal pain that wakes you in the night.  You have abdominal pain that is worsened or improved by eating food.  You have abdominal pain that is worsened with eating fatty foods.  You have a fever. SEEK IMMEDIATE MEDICAL CARE IF:  Your pain does not go away within 2 hours.  You keep throwing up (vomiting).  Your pain is felt only in portions of the abdomen, such as the right side or the left lower portion of the abdomen.  You pass bloody or black tarry stools. MAKE SURE YOU:  Understand these instructions.  Will watch your condition.  Will get help right away if you are not doing well or get worse.   This information  is not intended to replace advice given to you by your health care provider. Make sure you discuss any questions you have with your health care provider.   Document Released: 03/06/2005 Document Revised: 02/15/2015 Document Reviewed: 02/03/2013 Elsevier Interactive Patient Education [employer].

## 2015-07-16 NOTE — ED Notes (Signed)
Pt here for blood in his stool this AM. Pt is homeless. Pt was here yesterday for chest pain, denies chest pain currently.

## 2015-07-16 NOTE — ED Notes (Signed)
Pt ambulated to bathroom without difficulty.

## 2015-07-17 ENCOUNTER — Encounter (HOSPITAL_COMMUNITY): Payer: Self-pay | Admitting: Emergency Medicine

## 2015-07-17 ENCOUNTER — Emergency Department (HOSPITAL_COMMUNITY)
Admission: EM | Admit: 2015-07-17 | Discharge: 2015-07-17 | Discharge status: Against Medical Advice | Payer: Medicare Other | Attending: Emergency Medicine | Admitting: Emergency Medicine

## 2015-07-17 ENCOUNTER — Emergency Department (HOSPITAL_COMMUNITY)
Admission: EM | Admit: 2015-07-17 | Discharge: 2015-07-17 | Disposition: A | Discharge status: Home / Self Care | Payer: Medicare Other | Attending: Emergency Medicine | Admitting: Emergency Medicine

## 2015-07-17 DIAGNOSIS — Z8719 Personal history of other diseases of the digestive system: Secondary | ICD-10-CM | POA: Diagnosis not present

## 2015-07-17 DIAGNOSIS — I482 Chronic atrial fibrillation: Secondary | ICD-10-CM | POA: Insufficient documentation

## 2015-07-17 DIAGNOSIS — Z86018 Personal history of other benign neoplasm: Secondary | ICD-10-CM | POA: Insufficient documentation

## 2015-07-17 DIAGNOSIS — F17210 Nicotine dependence, cigarettes, uncomplicated: Secondary | ICD-10-CM | POA: Diagnosis not present

## 2015-07-17 DIAGNOSIS — G8929 Other chronic pain: Secondary | ICD-10-CM | POA: Diagnosis not present

## 2015-07-17 DIAGNOSIS — F329 Major depressive disorder, single episode, unspecified: Secondary | ICD-10-CM | POA: Diagnosis not present

## 2015-07-17 DIAGNOSIS — Z950 Presence of cardiac pacemaker: Secondary | ICD-10-CM | POA: Insufficient documentation

## 2015-07-17 DIAGNOSIS — Z79899 Other long term (current) drug therapy: Secondary | ICD-10-CM | POA: Diagnosis not present

## 2015-07-17 DIAGNOSIS — Z8572 Personal history of non-Hodgkin lymphomas: Secondary | ICD-10-CM | POA: Insufficient documentation

## 2015-07-17 DIAGNOSIS — I252 Old myocardial infarction: Secondary | ICD-10-CM | POA: Insufficient documentation

## 2015-07-17 DIAGNOSIS — M25562 Pain in left knee: Secondary | ICD-10-CM | POA: Diagnosis not present

## 2015-07-17 DIAGNOSIS — M25561 Pain in right knee: Secondary | ICD-10-CM | POA: Insufficient documentation

## 2015-07-17 DIAGNOSIS — Z9181 History of falling: Secondary | ICD-10-CM | POA: Diagnosis not present

## 2015-07-17 DIAGNOSIS — R410 Disorientation, unspecified: Secondary | ICD-10-CM | POA: Insufficient documentation

## 2015-07-17 DIAGNOSIS — Z87438 Personal history of other diseases of male genital organs: Secondary | ICD-10-CM | POA: Diagnosis not present

## 2015-07-17 DIAGNOSIS — Z7982 Long term (current) use of aspirin: Secondary | ICD-10-CM | POA: Insufficient documentation

## 2015-07-17 DIAGNOSIS — I10 Essential (primary) hypertension: Secondary | ICD-10-CM | POA: Insufficient documentation

## 2015-07-17 DIAGNOSIS — Z7952 Long term (current) use of systemic steroids: Secondary | ICD-10-CM | POA: Insufficient documentation

## 2015-07-17 DIAGNOSIS — M109 Gout, unspecified: Secondary | ICD-10-CM | POA: Diagnosis not present

## 2015-07-17 NOTE — Progress Notes (Signed)
Patient has been noted to have been seen in the ED 25 times within the last six months with 1 admission.  EDCM checked OR schedule with AC for 07/18/2015.  Patient is not listed for surgery at [facility] main or short stay operating rooms.  He is not listed for surgery at [facility] main or short stay operating rooms.

## 2015-07-17 NOTE — Discharge Instructions (Signed)
Joint Pain Joint pain, which is also called arthralgia, can be caused by many things. Joint pain often goes away when you follow your health care provider's instructions for relieving pain at home. However, joint pain can also be caused by conditions that require further treatment. Common causes of joint pain include:  Bruising in the area of the joint.  Overuse of the joint.  Wear and tear on the joints that occur with aging (osteoarthritis).  Various other forms of arthritis.  A buildup of a crystal form of uric acid in the joint (gout).  Infections of the joint (septic arthritis) or of the bone (osteomyelitis). Your health care provider may recommend medicine to help with the pain. If your joint pain continues, additional tests may be needed to diagnose your condition. HOME CARE INSTRUCTIONS Watch your condition for any changes. Follow these instructions as directed to lessen the pain that you are feeling.  Take medicines only as directed by your health care provider.  Rest the affected area for as long as your health care provider says that you should. If directed to do so, raise the painful joint above the level of your heart while you are sitting or lying down.  Do not do things that cause or worsen pain.  If directed, apply ice to the painful area:  Put ice in a plastic bag.  Place a towel between your skin and the bag.  Leave the ice on for 20 minutes, 2-3 times per day.  Wear an elastic bandage, splint, or sling as directed by your health care provider. Loosen the elastic bandage or splint if your fingers or toes become numb and tingle, or if they turn cold and blue.  Begin exercising or stretching the affected area as directed by your health care provider. Ask your health care provider what types of exercise are safe for you.  Keep all follow-up visits as directed by your health care provider. This is important. SEEK MEDICAL CARE IF:  Your pain increases, and medicine  does not help.  Your joint pain does not improve within 3 days.  You have increased bruising or swelling.  You have a fever.  You lose 10 lb (4.5 kg) or more without trying. SEEK IMMEDIATE MEDICAL CARE IF:  You are not able to move the joint.  Your fingers or toes become numb or they turn cold and blue.   This information is not intended to replace advice given to you by your health care provider. Make sure you discuss any questions you have with your health care provider.   Document Released: 05/27/2005 Document Revised: 06/17/2014 Document Reviewed: 03/08/2014 Elsevier Interactive Patient Education 2016 [employer]. Make an appointment  With your PCP for follow up care

## 2015-07-17 NOTE — Progress Notes (Signed)
EDCM spoke to patient at bedside.  Patient reports he is from [place].  Patient is homeless.  Patient listed as having Medicare insurance and confirms his pcp is Dr. Shona Simpson.  Patient reports he receives one thousand dollars income with disability.  Patient reports he is getting married on Sunday and then is going to [place].  EDCM informed EDRN of this.  Patient oriented to month, reports it is 2007, unable to say who is president, "I don't keep up with that"  Able to say his birthday correctly.  Patient witnessed walking to bathroom without assistance.  Patient reports he was in [facility] jail for two months for trespassing.  [facility] provided patient with contact information to [facility].  [facility] strongly encouraged patient to go there tomorrow.  [facility] consulted EDSW to speak to patient regarding homeless issues.  No further EDCM needs at this time.

## 2015-07-17 NOTE — ED Notes (Signed)
Called for pat again.  No answer.  Chart dismissed

## 2015-07-17 NOTE — ED Notes (Addendum)
Pt states "the ambulance brought me here this morning.  I'm supposed to have knee replacement in the morning.  I stay @ the [facility].  I've got to call the doctor.  She's [place], I don't know her name.  I walked down the street to get me a pack of cigarettes and the man @ the store called the ambulance.  They don't feed you at the NH, it's not worth eating."  Pt is A&O x 3

## 2015-07-17 NOTE — ED Notes (Addendum)
Per EMS, pt homeless, c/o chronic bilateral knee and leg pain. Hx of gout. Pt denies new injury to areas, both knees red. Seen yesterday for constipation.

## 2015-07-17 NOTE — ED Notes (Signed)
Pt states that he has bilateral knee pain and is having surgery tomorrow but doesn't know who is doing the surgery. Alert and oriented.

## 2015-07-17 NOTE — ED Provider Notes (Signed)
CSN: [id number]     Arrival date & time 07/17/15  1846 History  By signing my name below, I, Irene Pap, attest that this documentation has been prepared under the direction and in the presence of Junius Creamer, NP-C. Electronically Signed: Irene Pap, ED Scribe. 07/17/2015. 8:32 PM.   Chief Complaint  Patient presents with  . Knee Pain   The history is provided by the patient. No language interpreter was used.  HPI Comments: [name] is a 70 y.o. Male with a hx of gout, HTN, non-Hodgkin's lymphoma, and BPH who presents to the Emergency Department complaining of knee pain onset this morning. Pt has been seen multiple times in the ED over the past 5 days. He states that his knees are hurting and he needs to call the doctor to get a surgery done. He also reports that he is getting his colon taken out tomorrow. He reports that he has been rubbing his knees to ease the pain to no relief. Pt states that he comes from a nursing home on [facility], but does not know which one it is. Pt is a poor historian. He denies fever, chills, nausea, or vomiting.  Past Medical History  Diagnosis Date  . Depression   . Fall at nursing home     December 2014  . History of migraine   . Gout attack   . Complete heart block ([place])     St. Jude pacemaker - followed by Dr. Lovena Le  . Chronic atrial fibrillation ([place])   . HLD (hyperlipidemia)   . GIB (gastrointestinal bleeding)   . BPH (benign prostatic hyperplasia)   . Essential hypertension   . Gastric ulcer   . Non Hodgkin's lymphoma ([place])   . Tubular adenoma of colon    Past Surgical History  Procedure Laterality Date  . Pacemaker generator change      Dr. Lovena Le  . Appendectomy    . Cholecystectomy    . Open reduction of hip Right 05/28/2013    Procedure: OPEN REDUCTION OF HIP;  Surgeon: Renette Butters, MD;  Location: [place];  Service: Orthopedics;  Laterality: Right;  . Esophagogastroduodenoscopy (egd) with propofol N/A 09/26/2014     Procedure: ESOPHAGOGASTRODUODENOSCOPY (EGD) WITH PROPOFOL;  Surgeon: Jerene Bears, MD;  Location: [facility] ENDOSCOPY;  Service: Endoscopy;  Laterality: N/A;  . Colonoscopy N/A 10/01/2014    Procedure: COLONOSCOPY;  Surgeon: Jerene Bears, MD;  Location: [facility] ENDOSCOPY;  Service: Endoscopy;  Laterality: N/A;  . Partial colectomy N/A 10/05/2014    Procedure: SUBTOTAL COLECTOMY WITH ILEORECTAL ANASTOMOSIS;  Surgeon: Donnie Mesa, MD;  Location: [place];  Service: General;  Laterality: N/A;  . Flexible sigmoidoscopy N/A 11/07/2014    Procedure: FLEXIBLE SIGMOIDOSCOPY;  Surgeon: Jerene Bears, MD;  Location[facility] ENDOSCOPY;  Service: Endoscopy;  Laterality: N/A;  . Gastric resection     Family History  Problem Relation Age of Onset  . Colon cancer Mother    Social History  Substance Use Topics  . Smoking status: Current Some Day Smoker -- 0.50 packs/day for 66 years    Types: Cigarettes  . Smokeless tobacco: Former Systems developer    Types: Snuff, Chew     Comment: 05/27/2013 "haven't used chew or snuff in ~ 30 yr"  . Alcohol Use: No    Review of Systems  Constitutional: Negative for fever and chills.  Respiratory: Negative for shortness of breath.   Gastrointestinal: Negative for nausea and vomiting.  Musculoskeletal: Positive for arthralgias. Negative  for joint swelling.  Skin: Negative for color change.  Psychiatric/Behavioral: Positive for confusion. Negative for behavioral problems and agitation.  All other systems reviewed and are negative.  Allergies  Review of patient's allergies indicates no known allergies.  Home Medications   Prior to Admission medications   Medication Sig Start Date End Date Taking? Authorizing Provider  albuterol (PROVENTIL HFA;VENTOLIN HFA) 108 (90 Base) MCG/ACT inhaler Inhale 1 puff into the lungs every 6 (six) hours as needed for wheezing or shortness of breath.   Yes Historical Provider, MD  aspirin EC 81 MG tablet Take 81 mg by mouth daily.   Yes Historical Provider, MD   carvedilol (COREG) 6.25 MG tablet Take 1 tablet (6.25 mg total) by mouth 2 (two) times daily with a meal. 11/16/14  Yes Velvet Bathe, MD  polyethylene glycol (MIRALAX / GLYCOLAX) packet Take 17 g by mouth daily. 07/16/15  Yes Courteney Lyn Mackuen, MD  predniSONE (DELTASONE) 20 MG tablet Take 3 tablets (60 mg total) by mouth daily. 07/13/15  Yes Everlene Balls, MD  sodium phosphate Pediatric (FLEET) 3.5-9.5 GM/59ML enema Place 66 mLs (1 enema total) rectally once. 07/16/15  Yes Courteney Lyn Mackuen, MD  traMADol (ULTRAM) 50 MG tablet Take 1 tablet (50 mg total) by mouth every 6 (six) hours as needed. Patient taking differently: Take 50 mg by mouth every 6 (six) hours as needed for moderate pain.  07/14/15  Yes Etta Quill, NP   BP 135/66 mmHg  Pulse 100  Temp(Src) 98 F (36.7 C) (Oral)  Resp 16  SpO2 98% Physical Exam  Constitutional: He is oriented to person, place, and time. He appears well-developed and well-nourished.  HENT:  Head: Normocephalic.  Eyes: Pupils are equal, round, and reactive to light.  Neck: Normal range of motion.  Cardiovascular: Normal rate and regular rhythm.   Pulmonary/Chest: Effort normal and breath sounds normal.  Musculoskeletal: Normal range of motion. He exhibits no edema or tenderness.       Right knee: He exhibits normal range of motion, no swelling, no deformity and no erythema. No tenderness found.       Left knee: He exhibits normal range of motion, no ecchymosis, no deformity and no laceration. No tenderness found.  Neurological: He is alert and oriented to person, place, and time.  Skin: Skin is warm.  Nursing note and vitals reviewed.   ED Course  Procedures (including critical care time) DIAGNOSTIC STUDIES: Oxygen Saturation is 100% on RA, normal by my interpretation.    COORDINATION OF CARE: 8:32 PM-Discussed treatment plan with pt at bedside and pt agreed to plan.    Labs Review Labs Reviewed - No data to display  Imaging Review Ct Abdomen  Pelvis W Contrast  07/16/2015  CLINICAL DATA:  Pt states he started having bloody stools last night. Hx of same. Pt was here yesterday for chest pain No oral per ER MD 138ml omni 300^147mL OMNIPAQUE IOHEXOL 300 MG/ML SOLN EXAM: CT ABDOMEN AND PELVIS WITH CONTRAST TECHNIQUE: Multidetector CT imaging of the abdomen and pelvis was performed using the standard protocol following bolus administration of intravenous contrast. CONTRAST:  146mL OMNIPAQUE IOHEXOL 300 MG/ML  SOLN COMPARISON:  03/02/2015 and prior study 10/27/2006 FINDINGS: Lower chest: Heart is mildly enlarged. Transvenous pacemaker lead to the right ventricle. Coronary stent is visible. Upper abdomen: No focal abnormality identified within the liver, spleen, pancreas. The gallbladder is surgically absent. There is mild and stable dilatation of the intra and extrahepatic biliary ducts. Stable right adrenal lesion  is 0.8 x 1.6 cm and is low in attenuation. The left adrenal gland has a normal appearance. There are bilateral renal cysts. No hydronephrosis. Gastrointestinal tract: The stomach is less distended than on the previous exam. Small bowel loops descends into the right mid abdomen consistent with small bowel malrotation. Patient has undergone partial colectomy. The distal colonic loops, sigmoid, and rectum are distended with of large amount of stool. Pelvis: Urinary bladder has a normal appearance. Prostate gland has a normal appearance. No free pelvic fluid. Retroperitoneum: There is marked atherosclerotic calcification of the abdominal aorta. Suspect at least partial occlusion of the right common iliac artery at the bifurcation. This is later reconstituted. There is a vascular contrast within the superior and inferior mesenteric arteries as well as the celiac axis. No retroperitoneal or mesenteric adenopathy. Abdominal wall: Unremarkable. Osseous structures: Previous right hip ORIF. Mild degenerative changes are seen in the lumbar spine. No suspicious  lytic or blastic lesions are identified. IMPRESSION: 1. Significant stool within the distal colonic loops consistent with impaction. No definite mass is identified as a cause for obstruction. 2. Marked atherosclerosis of the abdominal aorta and iliac arteries. 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 Clinical correlation is recommended regarding possible right lower extremity ischemic symptoms. 3. Status post partial colectomy. 4. Cardiomegaly.  Coronary stent. 5. Status post cholecystectomy. Stable appearance of mildly dilated intra and extrahepatic biliary ducts. 6. Stable right adrenal lesion, consistent with a benign adenoma given the long-term stability. 7. Bilateral renal cysts. 8. Partial small bowel malrotation. Electronically Signed   By: Nolon Nations M.D.   On: 07/16/2015 19:10   I have personally reviewed and evaluated these images and lab results as part of my medical decision-making.   EKG Interpretation None    multiple ED visits recently states lives in a facility that has bad food and he walks the streets to get cigaretts knows the date, place, BD but does give convoluted history Social work evaluated patient and was given bus pass   MDM   Final diagnoses:  Arthralgia of both knees    I personally performed the services described in this documentation, which was scribed in my presence. The recorded information has been reviewed and is accurate.   Junius Creamer, NP 07/17/15 [id number]  Charlesetta Shanks, MD 07/24/15 1700

## 2015-07-17 NOTE — Progress Notes (Signed)
CSW met with patient at bedside. Patient confirms that he is homeless. Patient states he has been homeless for 30 years.  Patient informed CSW that he has been staying at [facility]. CSW offered to call [facility] tonight to get patient a bed again. However, he declined. Patient states that he does not want to go to [facility] tonight. Patient states that he wants to go to his friend's [facility] who lives in [place]. Patient states that he will sleep there tonight.  Patient informed CSW that he receives disability as a source of income, and food stamps.  CSW provided patient with community resources.   , LCSWA [phone] ED CSW 07/17/2015 10:35 PM    

## 2015-07-17 NOTE — ED Notes (Signed)
Patient called for out in lobby once, no answer

## 2015-07-18 ENCOUNTER — Emergency Department (HOSPITAL_COMMUNITY): Payer: Medicare Other

## 2015-07-18 ENCOUNTER — Emergency Department (HOSPITAL_COMMUNITY)
Admission: EM | Admit: 2015-07-18 | Discharge: 2015-07-19 | Disposition: A | Discharge status: Home / Self Care | Payer: Medicare Other | Attending: Emergency Medicine | Admitting: Emergency Medicine

## 2015-07-18 ENCOUNTER — Encounter (HOSPITAL_COMMUNITY): Payer: Self-pay

## 2015-07-18 DIAGNOSIS — I10 Essential (primary) hypertension: Secondary | ICD-10-CM | POA: Insufficient documentation

## 2015-07-18 DIAGNOSIS — Z79899 Other long term (current) drug therapy: Secondary | ICD-10-CM | POA: Diagnosis not present

## 2015-07-18 DIAGNOSIS — F17210 Nicotine dependence, cigarettes, uncomplicated: Secondary | ICD-10-CM | POA: Insufficient documentation

## 2015-07-18 DIAGNOSIS — I482 Chronic atrial fibrillation: Secondary | ICD-10-CM | POA: Diagnosis not present

## 2015-07-18 DIAGNOSIS — Z9049 Acquired absence of other specified parts of digestive tract: Secondary | ICD-10-CM | POA: Diagnosis not present

## 2015-07-18 DIAGNOSIS — Z86010 Personal history of colonic polyps: Secondary | ICD-10-CM | POA: Insufficient documentation

## 2015-07-18 DIAGNOSIS — F329 Major depressive disorder, single episode, unspecified: Secondary | ICD-10-CM | POA: Diagnosis not present

## 2015-07-18 DIAGNOSIS — Z8572 Personal history of non-Hodgkin lymphomas: Secondary | ICD-10-CM | POA: Insufficient documentation

## 2015-07-18 DIAGNOSIS — K602 Anal fissure, unspecified: Secondary | ICD-10-CM | POA: Insufficient documentation

## 2015-07-18 DIAGNOSIS — Z87828 Personal history of other (healed) physical injury and trauma: Secondary | ICD-10-CM | POA: Diagnosis not present

## 2015-07-18 DIAGNOSIS — M109 Gout, unspecified: Secondary | ICD-10-CM | POA: Insufficient documentation

## 2015-07-18 DIAGNOSIS — Z7952 Long term (current) use of systemic steroids: Secondary | ICD-10-CM | POA: Diagnosis not present

## 2015-07-18 DIAGNOSIS — Z7982 Long term (current) use of aspirin: Secondary | ICD-10-CM | POA: Diagnosis not present

## 2015-07-18 DIAGNOSIS — Z87438 Personal history of other diseases of male genital organs: Secondary | ICD-10-CM | POA: Insufficient documentation

## 2015-07-18 DIAGNOSIS — R0789 Other chest pain: Secondary | ICD-10-CM | POA: Diagnosis not present

## 2015-07-18 DIAGNOSIS — R109 Unspecified abdominal pain: Secondary | ICD-10-CM | POA: Diagnosis present

## 2015-07-18 LAB — CBC
HCT: 36 % — ABNORMAL LOW (ref 39.0–52.0)
Hemoglobin: 10.8 g/dL — ABNORMAL LOW (ref 13.0–17.0)
MCH: 28.6 pg (ref 26.0–34.0)
MCHC: 30 g/dL (ref 30.0–36.0)
MCV: 95.5 fL (ref 78.0–100.0)
PLATELETS: 147 10*3/uL — AB (ref 150–400)
RBC: 3.77 MIL/uL — AB (ref 4.22–5.81)
RDW: 15.4 % (ref 11.5–15.5)
WBC: 9.2 10*3/uL (ref 4.0–10.5)

## 2015-07-18 LAB — I-STAT TROPONIN, ED: TROPONIN I, POC: 0 ng/mL (ref 0.00–0.08)

## 2015-07-18 LAB — BASIC METABOLIC PANEL
ANION GAP: 13 (ref 5–15)
BUN: 29 mg/dL — AB (ref 6–20)
CO2: 23 mmol/L (ref 22–32)
Calcium: 9.1 mg/dL (ref 8.9–10.3)
Chloride: 104 mmol/L (ref 101–111)
Creatinine, Ser: 0.85 mg/dL (ref 0.61–1.24)
Glucose, Bld: 146 mg/dL — ABNORMAL HIGH (ref 65–99)
POTASSIUM: 4 mmol/L (ref 3.5–5.1)
SODIUM: 140 mmol/L (ref 135–145)

## 2015-07-18 NOTE — ED Notes (Signed)
Per [place] with c/o rectal bleeding all day, pt also c/o left side chest pain, pt seen at [facility] for similar symptoms yesterday. Pt A&Ox4, respirations equal and unlabored, skin warm and dry

## 2015-07-19 ENCOUNTER — Emergency Department (HOSPITAL_COMMUNITY)
Admission: EM | Admit: 2015-07-19 | Discharge: 2015-07-19 | Disposition: A | Discharge status: Home / Self Care | Payer: Medicare Other | Attending: Emergency Medicine | Admitting: Emergency Medicine

## 2015-07-19 ENCOUNTER — Encounter (HOSPITAL_COMMUNITY): Payer: Self-pay | Admitting: Family Medicine

## 2015-07-19 ENCOUNTER — Emergency Department (HOSPITAL_COMMUNITY)
Admission: EM | Admit: 2015-07-19 | Discharge: 2015-07-20 | Disposition: A | Discharge status: Home / Self Care | Payer: Medicare Other | Source: Home / Self Care

## 2015-07-19 ENCOUNTER — Encounter (HOSPITAL_COMMUNITY): Payer: Self-pay | Admitting: Emergency Medicine

## 2015-07-19 ENCOUNTER — Emergency Department (HOSPITAL_COMMUNITY)
Admission: EM | Admit: 2015-07-19 | Discharge: 2015-07-19 | Disposition: A | Discharge status: Home / Self Care | Payer: Medicare Other

## 2015-07-19 ENCOUNTER — Encounter (HOSPITAL_COMMUNITY): Payer: Self-pay | Admitting: *Deleted

## 2015-07-19 DIAGNOSIS — M25561 Pain in right knee: Secondary | ICD-10-CM | POA: Insufficient documentation

## 2015-07-19 DIAGNOSIS — M25562 Pain in left knee: Secondary | ICD-10-CM | POA: Diagnosis not present

## 2015-07-19 DIAGNOSIS — I10 Essential (primary) hypertension: Secondary | ICD-10-CM

## 2015-07-19 DIAGNOSIS — F17210 Nicotine dependence, cigarettes, uncomplicated: Secondary | ICD-10-CM | POA: Insufficient documentation

## 2015-07-19 DIAGNOSIS — R05 Cough: Secondary | ICD-10-CM | POA: Diagnosis not present

## 2015-07-19 DIAGNOSIS — R0789 Other chest pain: Secondary | ICD-10-CM | POA: Diagnosis present

## 2015-07-19 DIAGNOSIS — R079 Chest pain, unspecified: Secondary | ICD-10-CM | POA: Insufficient documentation

## 2015-07-19 DIAGNOSIS — K602 Anal fissure, unspecified: Secondary | ICD-10-CM | POA: Diagnosis not present

## 2015-07-19 LAB — BASIC METABOLIC PANEL
ANION GAP: 11 (ref 5–15)
BUN: 19 mg/dL (ref 6–20)
CHLORIDE: 107 mmol/L (ref 101–111)
CO2: 22 mmol/L (ref 22–32)
Calcium: 9.1 mg/dL (ref 8.9–10.3)
Creatinine, Ser: 0.84 mg/dL (ref 0.61–1.24)
GFR calc Af Amer: >60 mL/min (ref >60)
GFR calc non Af Amer: >60 mL/min (ref >60)
GLUCOSE: 138 mg/dL — AB (ref 65–99)
POTASSIUM: 3.9 mmol/L (ref 3.5–5.1)
Sodium: 140 mmol/L (ref 135–145)

## 2015-07-19 LAB — CBC
HEMATOCRIT: 34.8 % — AB (ref 39.0–52.0)
HEMOGLOBIN: 11 g/dL — AB (ref 13.0–17.0)
MCH: 29.7 pg (ref 26.0–34.0)
MCHC: 31.6 g/dL (ref 30.0–36.0)
MCV: 94.1 fL (ref 78.0–100.0)
Platelets: 146 10*3/uL — ABNORMAL LOW (ref 150–400)
RBC: 3.7 MIL/uL — ABNORMAL LOW (ref 4.22–5.81)
RDW: 15.4 % (ref 11.5–15.5)
WBC: 9.4 10*3/uL (ref 4.0–10.5)

## 2015-07-19 LAB — I-STAT TROPONIN, ED: Troponin i, poc: 0.01 ng/mL (ref 0.00–0.08)

## 2015-07-19 LAB — POC OCCULT BLOOD, ED: Fecal Occult Bld: POSITIVE — AB

## 2015-07-19 NOTE — ED Notes (Addendum)
Pt arrived by [facility] for chest wall pain and bilateral knee pain x 44 years. Reports having productive cough with yellow sputum, chest pain occurs when breathing and coughing. Airway intact at triage. Pt was here earlier and left without being seen.

## 2015-07-19 NOTE — ED Notes (Addendum)
Pt here for bilateral knee pain. Seen here recently for the same. sts that he wants to be admitted to the hospital.

## 2015-07-19 NOTE — Discharge Instructions (Signed)
The bleeding, which you're having from your rectum, is because of tearing of the skin of the anus. It is important to clean well with a soft moist cloth after you have a bowel movement. Diaper wipes work very well, for this.   Anal Fissure, Adult An anal fissure is a small tear or crack in the skin around the anus. Bleeding from a fissure usually stops on its own within a few minutes. However, bleeding will often occur again with each bowel movement until the crack heals. CAUSES This condition may be caused by:  Passing large, hard stool (feces).  Frequent diarrhea.  Constipation.  Inflammatory bowel disease (Crohn disease or ulcerative colitis).  Infections.  Anal sex. SYMPTOMS Symptoms of this condition include:  Bleeding from the rectum.  Small amounts of blood seen on your stool, on toilet paper, or in the toilet after a bowel movement.  Painful bowel movements.  Itching or irritation around the anus. DIAGNOSIS A health care provider may diagnose this condition by closely examining the anal area. An anal fissure can usually be seen with careful inspection. In some cases, a rectal exam may be performed, or a short tube (anoscope) may be used to examine the anal canal. TREATMENT Treatment for this condition may include:  Taking steps to avoid constipation. This may include making changes to your diet, such as increasing your intake of fiber or fluid.  Taking fiber supplements. These supplements can soften your stool to help make bowel movements easier. Your health care provider may also prescribe a stool softener if your stool is often hard.  Taking sitz baths. This may help to heal the tear.  Using medicated creams or ointments. These may be prescribed to lessen discomfort. HOME CARE INSTRUCTIONS Eating and Drinking  Avoid foods that may be constipating, such as bananas and dairy products.  Drink enough fluid to keep your urine clear or pale yellow.  Maintain a  diet that is high in fruits, whole grains, and vegetables. General Instructions  Keep the anal area as clean and dry as possible.  Take sitz baths as told by your health care provider. Do not use soap in the sitz baths.  Take over-the-counter and prescription medicines only as told by your health care provider.  Use creams or ointments only as told by your health care provider.  Keep all follow-up visits as told by your health care provider. This is important. SEEK MEDICAL CARE IF:  You have more bleeding.  You have a fever.  You have diarrhea that is mixed with blood.  You continue to have pain.  Your problem is getting worse rather than better.   This information is not intended to replace advice given to you by your health care provider. Make sure you discuss any questions you have with your health care provider.   Document Released: 05/27/2005 Document Revised: 02/15/2015 Document Reviewed: 08/22/2014 Elsevier Interactive Patient Education [employer].

## 2015-07-19 NOTE — ED Notes (Signed)
The patient called EMS complainig of chest pain.  He did not complain of any other symptoms.  EMS said he has an IV in his left A/C and she is not sure who placed the IV but she thinks he left from [facility]  with it still in his arm.   The patient was seen here earlier for the same thing.

## 2015-07-19 NOTE — ED Notes (Signed)
Please see downtime documentation.

## 2015-07-19 NOTE — ED Provider Notes (Signed)
CSN: [id number]     Arrival date & time 07/18/15  1916 History   First MD Initiated Contact with Patient 07/18/15 2333     No chief complaint on file.    (Consider location/radiation/quality/duration/timing/severity/associated sxs/prior Treatment) HPI   [name] is a 70 y.o. male who is here for evaluation of abdominal pain and rectal bleeding. He states he's had these problems for 1 year. He has been here in the ED, every day this month for various things. States that he lives at the [employer], and that he is hungry. He is liable specify anything else. There are no other known modifying factors.   Past Medical History  Diagnosis Date  . Depression   . Fall at nursing home     December 2014  . History of migraine   . Gout attack   . Complete heart block ([place])     St. Jude pacemaker - followed by Dr. Lovena Le  . Chronic atrial fibrillation ([place])   . HLD (hyperlipidemia)   . GIB (gastrointestinal bleeding)   . BPH (benign prostatic hyperplasia)   . Essential hypertension   . Gastric ulcer   . Non Hodgkin's lymphoma ([place])   . Tubular adenoma of colon    Past Surgical History  Procedure Laterality Date  . Pacemaker generator change      Dr. Lovena Le  . Appendectomy    . Cholecystectomy    . Open reduction of hip Right 05/28/2013    Procedure: OPEN REDUCTION OF HIP;  Surgeon: Renette Butters, MD;  Location: [place];  Service: Orthopedics;  Laterality: Right;  . Esophagogastroduodenoscopy (egd) with propofol N/A 09/26/2014    Procedure: ESOPHAGOGASTRODUODENOSCOPY (EGD) WITH PROPOFOL;  Surgeon: Jerene Bears, MD;  Location: [facility] ENDOSCOPY;  Service: Endoscopy;  Laterality: N/A;  . Colonoscopy N/A 10/01/2014    Procedure: COLONOSCOPY;  Surgeon: Jerene Bears, MD;  Location: [facility] ENDOSCOPY;  Service: Endoscopy;  Laterality: N/A;  . Partial colectomy N/A 10/05/2014    Procedure: SUBTOTAL COLECTOMY WITH ILEORECTAL ANASTOMOSIS;  Surgeon: Donnie Mesa, MD;  Location: [place];  Service:  General;  Laterality: N/A;  . Flexible sigmoidoscopy N/A 11/07/2014    Procedure: FLEXIBLE SIGMOIDOSCOPY;  Surgeon: Jerene Bears, MD;  Location: [facility] ENDOSCOPY;  Service: Endoscopy;  Laterality: N/A;  . Gastric resection     Family History  Problem Relation Age of Onset  . Colon cancer Mother    Social History  Substance Use Topics  . Smoking status: Current Some Day Smoker -- 0.50 packs/day for 66 years    Types: Cigarettes  . Smokeless tobacco: Former Systems developer    Types: Snuff, Chew     Comment: 05/27/2013 "haven't used chew or snuff in ~ 30 yr"  . Alcohol Use: No    Review of Systems  All other systems reviewed and are negative.     Allergies  Review of patient's allergies indicates no known allergies.  Home Medications   Prior to Admission medications   Medication Sig Start Date End Date Taking? Authorizing Provider  albuterol (PROVENTIL HFA;VENTOLIN HFA) 108 (90 Base) MCG/ACT inhaler Inhale 1 puff into the lungs every 6 (six) hours as needed for wheezing or shortness of breath.   Yes Historical Provider, MD  aspirin EC 81 MG tablet Take 81 mg by mouth daily.   Yes Historical Provider, MD  carvedilol (COREG) 6.25 MG tablet Take 1 tablet (6.25 mg total) by mouth 2 (two) times daily with a meal. 11/16/14  Yes Velvet Bathe,  MD  polyethylene glycol (MIRALAX / GLYCOLAX) packet Take 17 g by mouth daily. 07/16/15  Yes Courteney Lyn Mackuen, MD  predniSONE (DELTASONE) 20 MG tablet Take 3 tablets (60 mg total) by mouth daily. 07/13/15  Yes Everlene Balls, MD  sodium phosphate Pediatric (FLEET) 3.5-9.5 GM/59ML enema Place 66 mLs (1 enema total) rectally once. 07/16/15  Yes Courteney Lyn Mackuen, MD  traMADol (ULTRAM) 50 MG tablet Take 1 tablet (50 mg total) by mouth every 6 (six) hours as needed. Patient taking differently: Take 50 mg by mouth every 6 (six) hours as needed for moderate pain.  07/14/15  Yes Etta Quill, NP   BP 149/79 mmHg  Pulse 106  Temp(Src) 98.3 F (36.8 C) (Oral)  Resp 16   SpO2 98% Physical Exam  Constitutional: He is oriented to person, place, and time. He appears well-developed.  Malnourished, disheveled  HENT:  Head: Normocephalic and atraumatic.  Right Ear: External ear normal.  Left Ear: External ear normal.  Eyes: Conjunctivae and EOM are normal. Pupils are equal, round, and reactive to light.  Neck: Normal range of motion and phonation normal. Neck supple.  Cardiovascular: Normal rate, regular rhythm and normal heart sounds.   Pulmonary/Chest: Effort normal and breath sounds normal. He exhibits no bony tenderness.  Abdominal: Soft. There is no tenderness.  Genitourinary:  A few excoriations, bleeding somewhat. On digital rectal exam, there is brown stool in the rectal vault.  Musculoskeletal: Normal range of motion.  Neurological: He is alert and oriented to person, place, and time. No cranial nerve deficit or sensory deficit. He exhibits normal muscle tone. Coordination normal.  Skin: Skin is warm, dry and intact.  Psychiatric: He has a normal mood and affect. His behavior is normal. Judgment and thought content normal.  Nursing note and vitals reviewed.   ED Course  Procedures (including critical care time)   Medications - No data to display  No data found.       Labs Review Labs Reviewed  BASIC METABOLIC PANEL - Abnormal; Notable for the following:    Glucose, Bld 146 (*)    BUN 29 (*)    All other components within normal limits  CBC - Abnormal; Notable for the following:    RBC 3.77 (*)    Hemoglobin 10.8 (*)    HCT 36.0 (*)    Platelets 147 (*)    All other components within normal limits  I-STAT TROPOININ, ED  POC OCCULT BLOOD, ED    Imaging Review Dg Chest 2 View  07/18/2015  CLINICAL DATA:  Left chest pain for 1 day. EXAM: CHEST  2 VIEW COMPARISON:  07/15/2015. FINDINGS: Normal sized heart. Clear lungs. Stable right subclavian pacemaker leads. Mild thoracic spine degenerative changes. IMPRESSION: No acute abnormality.  Electronically Signed   By: Claudie Revering M.D.   On: 07/18/2015 20:35   I have personally reviewed and evaluated these images and lab results as part of my medical decision-making.   EKG Interpretation   Date/Time:  Tuesday July 18 2015 19:33:02 EST Ventricular Rate:  101 PR Interval:    QRS Duration: 94 QT Interval:  326 QTC Calculation: 422 R Axis:   82 Text Interpretation:  Atrial fibrillation with rapid ventricular response  Abnormal ECG since last tracing no significant change Confirmed by Dorene Bruni   MD, Girolamo Lortie [id number]) on 07/18/2015 11:59:43 PM      MDM   Final diagnoses:  Anal fissure    Rectal bleeding clearly from source of anal excoriations. Patient is  not unstable. She has numerous ED visits.   Nursing Notes Reviewed/ Care Coordinated Applicable Imaging Reviewed Interpretation of Laboratory Data incorporated into ED treatment  The patient appears reasonably screened and/or stabilized for discharge and I doubt any other medical condition or other [facility] requiring further screening, evaluation, or treatment in the ED at this time prior to discharge.  Plan: Home Medications- usual; Home Treatments- rest; return here if the recommended treatment, does not improve the symptoms; Recommended follow up- PCP prn    Daleen Bo, MD 07/20/15 [phone]

## 2015-07-19 NOTE — ED Notes (Addendum)
Patient went outside. Was told by police officer that he was not allowed to be on the property for reasons other than medical care. Patient stated that he wished to leave without being seen.

## 2015-07-19 NOTE — ED Notes (Signed)
Pt did not answer X 3 for radiology.

## 2015-07-22 NOTE — ED Provider Notes (Signed)
CSN: [id number]     Arrival date & time 07/16/15  1129 History   First MD Initiated Contact with Patient 07/16/15 1441     Chief Complaint  Patient presents with  . GI Bleeding  . Rectal Bleeding     HPI Patient presents with chief complaint of abdominal pain with some possible rectal bleeding.  No fever no chills.  No vomiting a significant amount.  States he may have had some constipation the last week or 2. Past Medical History  Diagnosis Date  . Depression   . Fall at nursing home     December 2014  . History of migraine   . Gout attack   . Complete heart block ([place])     St. Jude pacemaker - followed by Dr. Lovena Le  . Chronic atrial fibrillation ([place])   . HLD (hyperlipidemia)   . GIB (gastrointestinal bleeding)   . BPH (benign prostatic hyperplasia)   . Essential hypertension   . Gastric ulcer   . Non Hodgkin's lymphoma ([place])   . Tubular adenoma of colon    Past Surgical History  Procedure Laterality Date  . Pacemaker generator change      Dr. Lovena Le  . Appendectomy    . Cholecystectomy    . Open reduction of hip Right 05/28/2013    Procedure: OPEN REDUCTION OF HIP;  Surgeon: Renette Butters, MD;  Location: [place];  Service: Orthopedics;  Laterality: Right;  . Esophagogastroduodenoscopy (egd) with propofol N/A 09/26/2014    Procedure: ESOPHAGOGASTRODUODENOSCOPY (EGD) WITH PROPOFOL;  Surgeon: Jerene Bears, MD;  Location: [facility] ENDOSCOPY;  Service: Endoscopy;  Laterality: N/A;  . Colonoscopy N/A 10/01/2014    Procedure: COLONOSCOPY;  Surgeon: Jerene Bears, MD;  Location: [facility] ENDOSCOPY;  Service: Endoscopy;  Laterality: N/A;  . Partial colectomy N/A 10/05/2014    Procedure: SUBTOTAL COLECTOMY WITH ILEORECTAL ANASTOMOSIS;  Surgeon: Donnie Mesa, MD;  Location: [place];  Service: General;  Laterality: N/A;  . Flexible sigmoidoscopy N/A 11/07/2014    Procedure: FLEXIBLE SIGMOIDOSCOPY;  Surgeon: Jerene Bears, MD;  Location: [facility] ENDOSCOPY;  Service: Endoscopy;  Laterality: N/A;  .  Gastric resection     Family History  Problem Relation Age of Onset  . Colon cancer Mother    Social History  Substance Use Topics  . Smoking status: Current Some Day Smoker -- 0.50 packs/day for 66 years    Types: Cigarettes  . Smokeless tobacco: Former Systems developer    Types: Snuff, Chew     Comment: 05/27/2013 "haven't used chew or snuff in ~ 30 yr"  . Alcohol Use: No    Review of Systems  Constitutional: Negative for fever and chills.  Gastrointestinal: Positive for constipation.  All other systems reviewed and are negative.     Allergies  Review of patient's allergies indicates no known allergies.  Home Medications   Prior to Admission medications   Medication Sig Start Date End Date Taking? Authorizing Provider  albuterol (PROVENTIL HFA;VENTOLIN HFA) 108 (90 Base) MCG/ACT inhaler Inhale 1 puff into the lungs every 6 (six) hours as needed for wheezing or shortness of breath.   Yes Historical Provider, MD  aspirin EC 81 MG tablet Take 81 mg by mouth daily.   Yes Historical Provider, MD  carvedilol (COREG) 6.25 MG tablet Take 1 tablet (6.25 mg total) by mouth 2 (two) times daily with a meal. 11/16/14  Yes Velvet Bathe, MD  predniSONE (DELTASONE) 20 MG tablet Take 3 tablets (60 mg total) by mouth daily. 07/13/15  Yes Everlene Balls, MD  traMADol (ULTRAM) 50 MG tablet Take 1 tablet (50 mg total) by mouth every 6 (six) hours as needed. Patient taking differently: Take 50 mg by mouth every 6 (six) hours as needed for moderate pain.  07/14/15  Yes Etta Quill, NP  polyethylene glycol (MIRALAX / GLYCOLAX) packet Take 17 g by mouth daily. 07/16/15   Courteney Lyn Mackuen, MD  sodium phosphate Pediatric (FLEET) 3.5-9.5 GM/59ML enema Place 66 mLs (1 enema total) rectally once. 07/16/15   Courteney Lyn Mackuen, MD   BP 142/83 mmHg  Pulse 82  Temp(Src) 97.6 F (36.4 C) (Oral)  Resp 18  Ht 5\' 9"  (1.753 m)  SpO2 99% Physical Exam  Constitutional: He is oriented to person, place, and time. He appears  well-developed and well-nourished. No distress.  HENT:  Head: Normocephalic and atraumatic.  Eyes: Pupils are equal, round, and reactive to light.  Neck: Normal range of motion.  Cardiovascular: Normal rate and intact distal pulses.   Pulmonary/Chest: No respiratory distress.  Abdominal: Normal appearance. He exhibits no distension. There is generalized tenderness. There is guarding. There is no rebound.  Musculoskeletal: Normal range of motion.  Neurological: He is alert and oriented to person, place, and time. No cranial nerve deficit.  Skin: Skin is warm and dry. No rash noted.  Psychiatric: He has a normal mood and affect. His behavior is normal.  Nursing note and vitals reviewed.   ED Course  Procedures (including critical care time)  Results for orders placed or performed during the hospital encounter of 07/16/15  Comprehensive metabolic panel  Result Value Ref Range   Sodium 137 135 - 145 mmol/L   Potassium 3.9 3.5 - 5.1 mmol/L   Chloride 101 101 - 111 mmol/L   CO2 21 (L) 22 - 32 mmol/L   Glucose, Bld 120 (H) 65 - 99 mg/dL   BUN 24 (H) 6 - 20 mg/dL   Creatinine, Ser 0.79 0.61 - 1.24 mg/dL   Calcium 9.2 8.9 - 10.3 mg/dL   Total Protein 7.2 6.5 - 8.1 g/dL   Albumin 3.7 3.5 - 5.0 g/dL   AST 28 15 - 41 U/L   ALT 24 17 - 63 U/L   Alkaline Phosphatase 94 38 - 126 U/L   Total Bilirubin 0.5 0.3 - 1.2 mg/dL   GFR calc non Af Amer >60 >60 mL/min   GFR calc Af Amer >60 >60 mL/min   Anion gap 15 5 - 15  CBC  Result Value Ref Range   WBC 6.7 4.0 - 10.5 K/uL   RBC 3.76 (L) 4.22 - 5.81 MIL/uL   Hemoglobin 11.4 (L) 13.0 - 17.0 g/dL   HCT 35.8 (L) 39.0 - 52.0 %   MCV 95.2 78.0 - 100.0 fL   MCH 30.3 26.0 - 34.0 pg   MCHC 31.8 30.0 - 36.0 g/dL   RDW 14.7 11.5 - 15.5 %   Platelets 140 (L) 150 - 400 K/uL  Lipase, blood  Result Value Ref Range   Lipase 42 11 - 51 U/L  Type and screen [place]  Result Value Ref Range   ABO/RH(D) O POS    Antibody Screen NEG     Sample Expiration 07/19/2015    Dg Chest 2 View  07/18/2015  CLINICAL DATA:  Left chest pain for 1 day. EXAM: CHEST  2 VIEW COMPARISON:  07/15/2015. FINDINGS: Normal sized heart. Clear lungs. Stable right subclavian pacemaker leads. Mild thoracic spine degenerative changes. IMPRESSION: No acute  abnormality. Electronically Signed   By: Claudie Revering M.D.   On: 07/18/2015 20:35   Dg Chest 2 View  07/15/2015  CLINICAL DATA:  Chest pain and shortness of breath.  No radiation. EXAM: CHEST  2 VIEW COMPARISON:  07/12/2015 FINDINGS: The lungs are hyperinflated likely secondary to COPD. There is no focal parenchymal opacity. There is no pleural effusion or pneumothorax. The heart and mediastinal contours are unremarkable. There is a 2 lead cardiac pacemaker. The osseous structures are unremarkable. IMPRESSION: No active cardiopulmonary disease. Electronically Signed   By: Kathreen Devoid   On: 07/15/2015 16:07   Dg Chest 2 View  07/12/2015  CLINICAL DATA:  Left-sided non radiating chest pain worsening today. EXAM: CHEST  2 VIEW COMPARISON:  04/13/2015 FINDINGS: Dual-chamber pacer from the right with leads in unchanged position. Normal heart size and mediastinal contours. There is no edema, consolidation, effusion, or pneumothorax. No osseous findings to explain acute chest pain IMPRESSION: No acute finding Electronically Signed   By: Monte Fantasia M.D.   On: 07/12/2015 23:39   Ct Abdomen Pelvis W Contrast  07/16/2015  CLINICAL DATA:  Pt states he started having bloody stools last night. Hx of same. Pt was here yesterday for chest pain No oral per ER MD 184ml omni 300^120mL OMNIPAQUE IOHEXOL 300 MG/ML SOLN EXAM: CT ABDOMEN AND PELVIS WITH CONTRAST TECHNIQUE: Multidetector CT imaging of the abdomen and pelvis was performed using the standard protocol following bolus administration of intravenous contrast. CONTRAST:  161mL OMNIPAQUE IOHEXOL 300 MG/ML  SOLN COMPARISON:  03/02/2015 and prior study 10/27/2006 FINDINGS:  Lower chest: Heart is mildly enlarged. Transvenous pacemaker lead to the right ventricle. Coronary stent is visible. Upper abdomen: No focal abnormality identified within the liver, spleen, pancreas. The gallbladder is surgically absent. There is mild and stable dilatation of the intra and extrahepatic biliary ducts. Stable right adrenal lesion is 0.8 x 1.6 cm and is low in attenuation. The left adrenal gland has a normal appearance. There are bilateral renal cysts. No hydronephrosis. Gastrointestinal tract: The stomach is less distended than on the previous exam. Small bowel loops descends into the right mid abdomen consistent with small bowel malrotation. Patient has undergone partial colectomy. The distal colonic loops, sigmoid, and rectum are distended with of large amount of stool. Pelvis: Urinary bladder has a normal appearance. Prostate gland has a normal appearance. No free pelvic fluid. Retroperitoneum: There is marked atherosclerotic calcification of the abdominal aorta. Suspect at least partial occlusion of the right common iliac artery at the bifurcation. This is later reconstituted. There is a vascular contrast within the superior and inferior mesenteric arteries as well as the celiac axis. No retroperitoneal or mesenteric adenopathy. Abdominal wall: Unremarkable. Osseous structures: Previous right hip ORIF. Mild degenerative changes are seen in the lumbar spine. No suspicious lytic or blastic lesions are identified. IMPRESSION: 1. Significant stool within the distal colonic loops consistent with impaction. No definite mass is identified as a cause for obstruction. 2. Marked atherosclerosis of the abdominal aorta and iliac arteries. 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 Clinical correlation is recommended regarding possible right lower extremity ischemic symptoms. 3. Status post partial colectomy. 4. Cardiomegaly.  Coronary stent. 5. Status post cholecystectomy. Stable appearance  of mildly dilated intra and extrahepatic biliary ducts. 6. Stable right adrenal lesion, consistent with a benign adenoma given the long-term stability. 7. Bilateral renal cysts. 8. Partial small bowel malrotation. Electronically Signed   By: Nolon Nations M.D.   On:  07/16/2015 19:10        EKG Interpretation None      MDM   Final diagnoses:  Chronic abdominal pain        Leonard Schwartz, MD 07/22/15 1049

## 2015-07-23 ENCOUNTER — Emergency Department (HOSPITAL_COMMUNITY)
Admission: EM | Admit: 2015-07-23 | Discharge: 2015-07-23 | Disposition: A | Discharge status: Home / Self Care | Payer: Medicare Other

## 2015-07-24 ENCOUNTER — Encounter (HOSPITAL_COMMUNITY): Payer: Self-pay | Admitting: *Deleted

## 2015-07-24 ENCOUNTER — Emergency Department (HOSPITAL_COMMUNITY): Payer: Medicare Other

## 2015-07-24 ENCOUNTER — Emergency Department (HOSPITAL_COMMUNITY)
Admission: EM | Admit: 2015-07-24 | Discharge: 2015-07-24 | Disposition: A | Discharge status: Home / Self Care | Payer: Medicare Other | Source: Home / Self Care | Attending: Emergency Medicine | Admitting: Emergency Medicine

## 2015-07-24 DIAGNOSIS — F329 Major depressive disorder, single episode, unspecified: Secondary | ICD-10-CM

## 2015-07-24 DIAGNOSIS — R0789 Other chest pain: Secondary | ICD-10-CM

## 2015-07-24 DIAGNOSIS — Z8719 Personal history of other diseases of the digestive system: Secondary | ICD-10-CM | POA: Insufficient documentation

## 2015-07-24 DIAGNOSIS — Z79899 Other long term (current) drug therapy: Secondary | ICD-10-CM | POA: Insufficient documentation

## 2015-07-24 DIAGNOSIS — R062 Wheezing: Secondary | ICD-10-CM | POA: Insufficient documentation

## 2015-07-24 DIAGNOSIS — I10 Essential (primary) hypertension: Secondary | ICD-10-CM

## 2015-07-24 DIAGNOSIS — Z86018 Personal history of other benign neoplasm: Secondary | ICD-10-CM

## 2015-07-24 DIAGNOSIS — Z87438 Personal history of other diseases of male genital organs: Secondary | ICD-10-CM

## 2015-07-24 DIAGNOSIS — I499 Cardiac arrhythmia, unspecified: Secondary | ICD-10-CM

## 2015-07-24 DIAGNOSIS — Z7952 Long term (current) use of systemic steroids: Secondary | ICD-10-CM | POA: Insufficient documentation

## 2015-07-24 DIAGNOSIS — F17210 Nicotine dependence, cigarettes, uncomplicated: Secondary | ICD-10-CM

## 2015-07-24 DIAGNOSIS — M109 Gout, unspecified: Secondary | ICD-10-CM

## 2015-07-24 DIAGNOSIS — Z9181 History of falling: Secondary | ICD-10-CM

## 2015-07-24 DIAGNOSIS — R079 Chest pain, unspecified: Secondary | ICD-10-CM | POA: Insufficient documentation

## 2015-07-24 DIAGNOSIS — I252 Old myocardial infarction: Secondary | ICD-10-CM

## 2015-07-24 DIAGNOSIS — R05 Cough: Secondary | ICD-10-CM

## 2015-07-24 DIAGNOSIS — I482 Chronic atrial fibrillation: Secondary | ICD-10-CM | POA: Insufficient documentation

## 2015-07-24 DIAGNOSIS — Z8572 Personal history of non-Hodgkin lymphomas: Secondary | ICD-10-CM

## 2015-07-24 DIAGNOSIS — Z8639 Personal history of other endocrine, nutritional and metabolic disease: Secondary | ICD-10-CM

## 2015-07-24 DIAGNOSIS — Z7982 Long term (current) use of aspirin: Secondary | ICD-10-CM

## 2015-07-24 DIAGNOSIS — Z950 Presence of cardiac pacemaker: Secondary | ICD-10-CM

## 2015-07-24 LAB — BASIC METABOLIC PANEL
ANION GAP: 16 — AB (ref 5–15)
BUN: 31 mg/dL — ABNORMAL HIGH (ref 6–20)
CO2: 21 mmol/L — ABNORMAL LOW (ref 22–32)
Calcium: 8.9 mg/dL (ref 8.9–10.3)
Chloride: 102 mmol/L (ref 101–111)
Creatinine, Ser: 0.71 mg/dL (ref 0.61–1.24)
GFR calc Af Amer: >60 mL/min (ref >60)
GLUCOSE: 100 mg/dL — AB (ref 65–99)
POTASSIUM: 4.5 mmol/L (ref 3.5–5.1)
Sodium: 139 mmol/L (ref 135–145)

## 2015-07-24 LAB — CBC
HCT: 36.2 % — ABNORMAL LOW (ref 39.0–52.0)
Hemoglobin: 11.4 g/dL — ABNORMAL LOW (ref 13.0–17.0)
MCH: 29.5 pg (ref 26.0–34.0)
MCHC: 31.5 g/dL (ref 30.0–36.0)
MCV: 93.8 fL (ref 78.0–100.0)
PLATELETS: 167 10*3/uL (ref 150–400)
RBC: 3.86 MIL/uL — AB (ref 4.22–5.81)
RDW: 15 % (ref 11.5–15.5)
WBC: 9.3 10*3/uL (ref 4.0–10.5)

## 2015-07-24 LAB — I-STAT TROPONIN, ED: TROPONIN I, POC: 0 ng/mL (ref 0.00–0.08)

## 2015-07-24 MED ORDER — AEROCHAMBER PLUS W/MASK MISC: 1.0000 | Freq: Once | Status: DC

## 2015-07-24 MED ORDER — ALBUTEROL SULFATE (2.5 MG/3ML) 0.083% IN NEBU
5.0000 mg | INHALATION_SOLUTION | Freq: Once | RESPIRATORY_TRACT | Status: AC
  Administered 2015-07-24: 5 mg via RESPIRATORY_TRACT
  Filled 2015-07-24: qty 6

## 2015-07-24 MED ORDER — ALBUTEROL SULFATE HFA 108 (90 BASE) MCG/ACT IN AERS: 2.0000 | INHALATION_SPRAY | RESPIRATORY_TRACT | Status: DC | PRN

## 2015-07-24 NOTE — ED Notes (Addendum)
Pt refuses to stay hooked up to cardiac monitor. Pt agrees to stay hooked up to pulse ox.

## 2015-07-24 NOTE — ED Provider Notes (Signed)
CSN: [id number]     Arrival date & time 07/24/15  0708 History   First MD Initiated Contact with Patient 07/24/15 0920     Chief Complaint  Patient presents with  . Chest Pain     (Consider location/radiation/quality/duration/timing/severity/associated sxs/prior Treatment) The history is provided by the patient and medical records. No language interpreter was used.     [name] is a 70 y.o. male  with a hx of depression, gout, chronic a-fib, HTN, non Hodgkin's lymphoma presents to the Emergency Department complaining of gradual, persistent, progressively worsening chest pain aggravated by palpation and cough onset 8 AM this morning. Patient reports he has had a cough for a long time. He denies associated fever, chills, neck pain, abdominal pain, nausea, vomiting, diarrhea, weakness, dizziness, syncope. He has no associated diaphoresis or nausea. Patient denies a history of MI. He states that his pain is aggravated by his significant cough. He complains of wheezing and does report a history of COPD.  No treatments prior to arrival.  Past Medical History  Diagnosis Date  . Depression   . Fall at nursing home     December 2014  . History of migraine   . Gout attack   . Complete heart block ([place])     St. Jude pacemaker - followed by Dr. Lovena Le  . Chronic atrial fibrillation ([place])   . HLD (hyperlipidemia)   . GIB (gastrointestinal bleeding)   . BPH (benign prostatic hyperplasia)   . Essential hypertension   . Gastric ulcer   . Non Hodgkin's lymphoma ([place])   . Tubular adenoma of colon    Past Surgical History  Procedure Laterality Date  . Pacemaker generator change      Dr. Lovena Le  . Appendectomy    . Cholecystectomy    . Open reduction of hip Right 05/28/2013    Procedure: OPEN REDUCTION OF HIP;  Surgeon: Renette Butters, MD;  Location: [place];  Service: Orthopedics;  Laterality: Right;  . Esophagogastroduodenoscopy (egd) with propofol N/A 09/26/2014    Procedure:  ESOPHAGOGASTRODUODENOSCOPY (EGD) WITH PROPOFOL;  Surgeon: Jerene Bears, MD;  Location: [facility] ENDOSCOPY;  Service: Endoscopy;  Laterality: N/A;  . Colonoscopy N/A 10/01/2014    Procedure: COLONOSCOPY;  Surgeon: Jerene Bears, MD;  Location: [facility] ENDOSCOPY;  Service: Endoscopy;  Laterality: N/A;  . Partial colectomy N/A 10/05/2014    Procedure: SUBTOTAL COLECTOMY WITH ILEORECTAL ANASTOMOSIS;  Surgeon: Donnie Mesa, MD;  Location: [place];  Service: General;  Laterality: N/A;  . Flexible sigmoidoscopy N/A 11/07/2014    Procedure: FLEXIBLE SIGMOIDOSCOPY;  Surgeon: Jerene Bears, MD;  Location: [facility] ENDOSCOPY;  Service: Endoscopy;  Laterality: N/A;  . Gastric resection     Family History  Problem Relation Age of Onset  . Colon cancer Mother    Social History  Substance Use Topics  . Smoking status: Current Some Day Smoker -- 0.50 packs/day for 66 years    Types: Cigarettes  . Smokeless tobacco: Former Systems developer    Types: Snuff, Chew     Comment: 05/27/2013 "haven't used chew or snuff in ~ 30 yr"  . Alcohol Use: No    Review of Systems  Constitutional: Negative for fever, diaphoresis, appetite change, fatigue and unexpected weight change.  HENT: Negative for mouth sores.   Eyes: Negative for visual disturbance.  Respiratory: Positive for cough, chest tightness and wheezing. Negative for shortness of breath.   Cardiovascular: Positive for chest pain ( with coughing and palpation).  Gastrointestinal: Negative for  nausea, vomiting, abdominal pain, diarrhea and constipation.  Endocrine: Negative for polydipsia, polyphagia and polyuria.  Genitourinary: Negative for dysuria, urgency, frequency and hematuria.  Musculoskeletal: Negative for back pain and neck stiffness.  Skin: Negative for rash.  Allergic/Immunologic: Negative for immunocompromised state.  Neurological: Negative for syncope, light-headedness and headaches.  Hematological: Does not bruise/bleed easily.  Psychiatric/Behavioral: Negative for sleep  disturbance. The patient is not nervous/anxious.       Allergies  Review of patient's allergies indicates no known allergies.  Home Medications   Prior to Admission medications   Medication Sig Start Date End Date Taking? Authorizing Provider  aspirin EC 81 MG tablet Take 81 mg by mouth daily.   Yes Historical Provider, MD  carvedilol (COREG) 6.25 MG tablet Take 1 tablet (6.25 mg total) by mouth 2 (two) times daily with a meal. 11/16/14  Yes Velvet Bathe, MD  polyethylene glycol (MIRALAX / GLYCOLAX) packet Take 17 g by mouth daily. 07/16/15  Yes Courteney Lyn Mackuen, MD  predniSONE (DELTASONE) 20 MG tablet Take 3 tablets (60 mg total) by mouth daily. 07/13/15  Yes Everlene Balls, MD  sodium phosphate Pediatric (FLEET) 3.5-9.5 GM/59ML enema Place 66 mLs (1 enema total) rectally once. 07/16/15  Yes Courteney Lyn Mackuen, MD  traMADol (ULTRAM) 50 MG tablet Take 1 tablet (50 mg total) by mouth every 6 (six) hours as needed. Patient taking differently: Take 50 mg by mouth every 6 (six) hours as needed for moderate pain.  07/14/15  Yes Etta Quill, NP  albuterol (PROVENTIL HFA;VENTOLIN HFA) 108 (90 Base) MCG/ACT inhaler Inhale 1 puff into the lungs every 6 (six) hours as needed for wheezing or shortness of breath.    Historical Provider, MD   BP 133/68 mmHg  Pulse 110  Temp(Src) 96 F (35.6 C) (Axillary)  Resp 20  SpO2 98% Physical Exam  Constitutional: He appears well-developed and well-nourished. No distress.  Awake, alert, nontoxic appearance  HENT:  Head: Normocephalic and atraumatic.  Mouth/Throat: Oropharynx is clear and moist. No oropharyngeal exudate.  Eyes: Conjunctivae are normal. No scleral icterus.  Neck: Normal range of motion. Neck supple.  Cardiovascular: Normal rate, normal heart sounds and intact distal pulses.  An irregularly irregular rhythm present.  Pulmonary/Chest: Effort normal. No respiratory distress. He has decreased breath sounds. He has wheezes.  Equal chest  expansion Decreased breath sounds throughout with wheezes throughout Productive cough  Abdominal: Soft. Bowel sounds are normal. He exhibits no mass. There is no tenderness. There is no rebound and no guarding.  Musculoskeletal: Normal range of motion. He exhibits no edema.  Neurological: He is alert.  Speech is clear and goal oriented Moves extremities without ataxia  Skin: Skin is warm and dry. He is not diaphoretic.  Psychiatric: He has a normal mood and affect.  Nursing note and vitals reviewed.   ED Course  Procedures (including critical care time) Labs Review Labs Reviewed  BASIC METABOLIC PANEL - Abnormal; Notable for the following:    CO2 21 (*)    Glucose, Bld 100 (*)    BUN 31 (*)    Anion gap 16 (*)    All other components within normal limits  CBC - Abnormal; Notable for the following:    RBC 3.86 (*)    Hemoglobin 11.4 (*)    HCT 36.2 (*)    All other components within normal limits  Randolm Idol, ED    Imaging Review Dg Chest 2 View  07/24/2015  CLINICAL DATA:  Chest pain intermittent for  several days. EXAM: CHEST  2 VIEW COMPARISON:  07/18/2015 FINDINGS: Atherosclerotic aortic arch. Dual lead pacer, lead positioning unchanged. Large lung volumes. Tapering of the peripheral pulmonary vasculature favors emphysema. Upper normal heart size. No pleural effusion or edema. Mild thoracic spondylosis. IMPRESSION: 1. Emphysema. 2. Atherosclerotic aortic arch. 3. Upper normal heart size. Electronically Signed   By: Van Clines M.D.   On: 07/24/2015 07:59   I have personally reviewed and evaluated these images and lab results as part of my medical decision-making.   EKG Interpretation   Date/Time:  Monday July 24 2015 07:19:44 EST Ventricular Rate:  102 PR Interval:    QRS Duration: 80 QT Interval:  354 QTC Calculation: 461 R Axis:   132 Text Interpretation:  Atrial fibrillation with rapid ventricular response  with premature ventricular or aberrantly  conducted complexes Right axis  deviation Possible Right ventricular hypertrophy Abnormal ECG no STEMI. no  sig change from old. alot of artifact Confirmed by Johnney Killian, MD, Jeannie Done  [phone]) on 07/24/2015 4:43:22 PM       MDM   Final diagnoses:  Chest pain, unspecified chest pain type  Wheezing   Modena Jansky [place] works with chest pain associated with cough and palpation.  Exam patient wheezing.  Denies diaphoresis, nausea, lightheadedness, syncope.  Given albuterol with complete resolution of his chest pain. Clear breath sounds. He is angulatory the emergency department without difficulty and is requesting discharge home.  Initially with mild pericardiac and A. fib on his EKG. Tachycardia has resolved. Patient chronically in A. fib. Labs reassuring with negative troponin. No anemia. No clinical symptoms of DVT. Doubt PE. Discussed close follow-up with his primary care physician within 48 hours. States understanding.  BP 116/62 mmHg  Pulse 89  Temp(Src) 96 F (35.6 C) (Axillary)  Resp 15  SpO2 98%    Abigail Butts, PA-C 07/24/15 1644  Charlesetta Shanks, MD 07/26/15 1132

## 2015-07-24 NOTE — ED Notes (Signed)
Pt reports chest pain and SOB. Pt was seen earlier today

## 2015-07-24 NOTE — Discharge Instructions (Signed)
1. Medications: albuterol, usual home medications 2. Treatment: rest, drink plenty of fluids,  3. Follow Up: Please followup with your primary doctor in 2 days for discussion of your diagnoses and further evaluation after today's visit; if you do not have a primary care doctor use the resource guide provided to find one; Please return to the ER for worsening symptoms  Bronchospasm, Adult A bronchospasm is when the tubes that carry air in and out of your lungs (airways) spasm or tighten. During a bronchospasm it is hard to breathe. This is because the airways get smaller. A bronchospasm can be triggered by:  Allergies. These may be to animals, pollen, food, or mold.  Infection. This is a common cause of bronchospasm.  Exercise.  Irritants. These include pollution, cigarette smoke, strong odors, aerosol sprays, and paint fumes.  Weather changes.  Stress.  Being emotional. HOME CARE   Always have a plan for getting help. Know when to call your doctor and local emergency services (911 in the [place] Know where you can get emergency care.  Only take medicines as told by your doctor.  If you were prescribed an inhaler or nebulizer machine, ask your doctor how to use it correctly. Always use a spacer with your inhaler if you were given one.  Stay calm during an attack. Try to relax and breathe more slowly.  Control your home environment:  Change your heating and air conditioning filter at least once a month.  Limit your use of fireplaces and wood stoves.  Do not  smoke. Do not  allow smoking in your home.  Avoid perfumes and fragrances.  Get rid of pests (such as roaches and mice) and their droppings.  Throw away plants if you see mold on them.  Keep your house clean and dust free.  Replace carpet with wood, tile, or vinyl flooring. Carpet can trap dander and dust.  Use allergy-proof pillows, mattress covers, and box spring covers.  Wash bed sheets and blankets every week in  hot water. Dry them in a dryer.  Use blankets that are made of polyester or cotton.  Wash hands frequently. GET HELP IF:  You have muscle aches.  You have chest pain.  The thick spit you spit or cough up (sputum) changes from clear or white to yellow, green, gray, or bloody.  The thick spit you spit or cough up gets thicker.  There are problems that may be related to the medicine you are given such as:  A rash.  Itching.  Swelling.  Trouble breathing. GET HELP RIGHT AWAY IF:  You feel you cannot breathe or catch your breath.  You cannot stop coughing.  Your treatment is not helping you breathe better.  You have very bad chest pain. MAKE SURE YOU:   Understand these instructions.  Will watch your condition.  Will get help right away if you are not doing well or get worse.   This information is not intended to replace advice given to you by your health care provider. Make sure you discuss any questions you have with your health care provider.   Document Released: 03/24/2009 Document Revised: 06/17/2014 Document Reviewed: 11/17/2012 Elsevier Interactive Patient Education [employer].

## 2015-07-24 NOTE — ED Notes (Signed)
Pt reports chest pain that is reproducible with palpation. Pt states that he also has leg pain.

## 2015-07-24 NOTE — ED Notes (Signed)
Jarrett Soho, [place], at bedside.

## 2015-07-25 ENCOUNTER — Emergency Department (HOSPITAL_COMMUNITY)
Admission: EM | Admit: 2015-07-25 | Discharge: 2015-07-25 | Disposition: A | Discharge status: Home / Self Care | Payer: Medicare Other | Source: Home / Self Care | Attending: Emergency Medicine | Admitting: Emergency Medicine

## 2015-07-25 DIAGNOSIS — R079 Chest pain, unspecified: Secondary | ICD-10-CM

## 2015-07-25 LAB — CBC
HCT: 33.6 % — ABNORMAL LOW (ref 39.0–52.0)
Hemoglobin: 10.5 g/dL — ABNORMAL LOW (ref 13.0–17.0)
MCH: 28.6 pg (ref 26.0–34.0)
MCHC: 31.3 g/dL (ref 30.0–36.0)
MCV: 91.6 fL (ref 78.0–100.0)
PLATELETS: 169 10*3/uL (ref 150–400)
RBC: 3.67 MIL/uL — ABNORMAL LOW (ref 4.22–5.81)
RDW: 14.8 % (ref 11.5–15.5)
WBC: 7.6 10*3/uL (ref 4.0–10.5)

## 2015-07-25 LAB — BASIC METABOLIC PANEL
Anion gap: 11 (ref 5–15)
BUN: 20 mg/dL (ref 6–20)
CALCIUM: 8.9 mg/dL (ref 8.9–10.3)
CO2: 22 mmol/L (ref 22–32)
CREATININE: 0.69 mg/dL (ref 0.61–1.24)
Chloride: 103 mmol/L (ref 101–111)
GFR calc non Af Amer: >60 mL/min (ref >60)
Glucose, Bld: 113 mg/dL — ABNORMAL HIGH (ref 65–99)
Potassium: 3.7 mmol/L (ref 3.5–5.1)
SODIUM: 136 mmol/L (ref 135–145)

## 2015-07-25 LAB — TROPONIN I

## 2015-07-25 NOTE — ED Provider Notes (Signed)
CSN: [id number]     Arrival date & time 07/24/15  1712 History  By signing my name below, I, [name], attest that this documentation has been prepared under the direction and in the presence of Orpah Greek, MD . Electronically Signed: [name], Scribe. 07/25/2015. 1:23 AM.   Chief Complaint  Patient presents with  . Chest Pain   The history is provided by the patient. No language interpreter was used.   HPI Comments:  [name] is a 70 y.o. male with PMHx of complete heart block, HLD, HTN and Non-Hodgkin's lymphoma who presents to the Emergency Department complaining of constant, moderate left-side chest pain for the past few weeks. No alleviating factors noted. He notes he has a pacemaker. Pt states he saw his PCP recently who said they were going to change the battery in his pacemaker. Pt has been seen in the ED 30 times in the past 6 months.  Past Medical History  Diagnosis Date  . Depression   . Fall at nursing home     December 2014  . History of migraine   . Gout attack   . Complete heart block ([place])     St. Jude pacemaker - followed by Dr. Lovena Le  . Chronic atrial fibrillation ([place])   . HLD (hyperlipidemia)   . GIB (gastrointestinal bleeding)   . BPH (benign prostatic hyperplasia)   . Essential hypertension   . Gastric ulcer   . Non Hodgkin's lymphoma ([place])   . Tubular adenoma of colon    Past Surgical History  Procedure Laterality Date  . Pacemaker generator change      Dr. Lovena Le  . Appendectomy    . Cholecystectomy    . Open reduction of hip Right 05/28/2013    Procedure: OPEN REDUCTION OF HIP;  Surgeon: Renette Butters, MD;  Location: [facility];  Service: Orthopedics;  Laterality: Right;  . Esophagogastroduodenoscopy (egd) with propofol N/A 09/26/2014    Procedure: ESOPHAGOGASTRODUODENOSCOPY (EGD) WITH PROPOFOL;  Surgeon: Jerene Bears, MD;  Location: [facility] ENDOSCOPY;  Service: Endoscopy;  Laterality: N/A;  . Colonoscopy N/A 10/01/2014     Procedure: COLONOSCOPY;  Surgeon: Jerene Bears, MD;  Location: [facility] ENDOSCOPY;  Service: Endoscopy;  Laterality: N/A;  . Partial colectomy N/A 10/05/2014    Procedure: SUBTOTAL COLECTOMY WITH ILEORECTAL ANASTOMOSIS;  Surgeon: Donnie Mesa, MD;  Location: [place];  Service: General;  Laterality: N/A;  . Flexible sigmoidoscopy N/A 11/07/2014    Procedure: FLEXIBLE SIGMOIDOSCOPY;  Surgeon: Jerene Bears, MD;  Location: [facility] ENDOSCOPY;  Service: Endoscopy;  Laterality: N/A;  . Gastric resection     Family History  Problem Relation Age of Onset  . Colon cancer Mother    Social History  Substance Use Topics  . Smoking status: Current Some Day Smoker -- 0.50 packs/day for 66 years    Types: Cigarettes  . Smokeless tobacco: Former Systems developer    Types: Snuff, Chew     Comment: 05/27/2013 "haven't used chew or snuff in ~ 30 yr"  . Alcohol Use: No    Review of Systems  Constitutional: Negative for fever.  Cardiovascular: Positive for chest pain.  All other systems reviewed and are negative.  Allergies  Review of patient's allergies indicates no known allergies.  Home Medications   Prior to Admission medications   Medication Sig Start Date End Date Taking? Authorizing Provider  albuterol (PROVENTIL HFA;VENTOLIN HFA) 108 (90 Base) MCG/ACT inhaler Inhale 1 puff into the lungs every 6 (six) hours  as needed for wheezing or shortness of breath.   Yes Historical Provider, MD  aspirin EC 81 MG tablet Take 81 mg by mouth daily.   Yes Historical Provider, MD  carvedilol (COREG) 6.25 MG tablet Take 1 tablet (6.25 mg total) by mouth 2 (two) times daily with a meal. 11/16/14  Yes Velvet Bathe, MD  polyethylene glycol (MIRALAX / GLYCOLAX) packet Take 17 g by mouth daily. 07/16/15  Yes Courteney Lyn Mackuen, MD  predniSONE (DELTASONE) 20 MG tablet Take 3 tablets (60 mg total) by mouth daily. 07/13/15  Yes Everlene Balls, MD  sodium phosphate Pediatric (FLEET) 3.5-9.5 GM/59ML enema Place 66 mLs (1 enema total) rectally once.  07/16/15  Yes Courteney Lyn Mackuen, MD  traMADol (ULTRAM) 50 MG tablet Take 1 tablet (50 mg total) by mouth every 6 (six) hours as needed. Patient taking differently: Take 50 mg by mouth every 6 (six) hours as needed for moderate pain.  07/14/15  Yes Etta Quill, NP   BP 143/56 mmHg  Pulse 101  Temp(Src) 98.5 F (36.9 C) (Oral)  Resp 34  SpO2 98% Physical Exam  Constitutional: He is oriented to person, place, and time. He appears well-developed and well-nourished. No distress.  HENT:  Head: Normocephalic and atraumatic.  Right Ear: Hearing normal.  Left Ear: Hearing normal.  Nose: Nose normal.  Mouth/Throat: Oropharynx is clear and moist and mucous membranes are normal.  Eyes: Conjunctivae and EOM are normal. Pupils are equal, round, and reactive to light.  Neck: Normal range of motion. Neck supple.  Cardiovascular: Regular rhythm, S1 normal and S2 normal.  Exam reveals no gallop and no friction rub.   No murmur heard. Left anterior chest wall tenderness  Pulmonary/Chest: Effort normal and breath sounds normal. No respiratory distress. He exhibits no tenderness.  Abdominal: Soft. Normal appearance and bowel sounds are normal. There is no hepatosplenomegaly. There is no tenderness. There is no rebound, no guarding, no tenderness at McBurney's point and negative Murphy's sign. No hernia.  Musculoskeletal: Normal range of motion.  Neurological: He is alert and oriented to person, place, and time. He has normal strength. No cranial nerve deficit or sensory deficit. Coordination normal. GCS eye subscore is 4. GCS verbal subscore is 5. GCS motor subscore is 6.  Skin: Skin is warm, dry and intact. No rash noted. No cyanosis.  Psychiatric: He has a normal mood and affect. His speech is normal and behavior is normal. Thought content normal.  Nursing note and vitals reviewed.   ED Course  Procedures  DIAGNOSTIC STUDIES: Oxygen Saturation is 98% on [place], normal by my interpretation.     COORDINATION OF CARE: 12:21 AM Discussed treatment plan with pt at bedside and pt agreed to plan.  Labs Review Labs Reviewed  CBC - Abnormal; Notable for the following:    RBC 3.67 (*)    Hemoglobin 10.5 (*)    HCT 33.6 (*)    All other components within normal limits  BASIC METABOLIC PANEL - Abnormal; Notable for the following:    Glucose, Bld 113 (*)    All other components within normal limits  TROPONIN I    Imaging Review Dg Chest 2 View  07/24/2015  CLINICAL DATA:  Chest pain intermittent for several days. EXAM: CHEST  2 VIEW COMPARISON:  07/18/2015 FINDINGS: Atherosclerotic aortic arch. Dual lead pacer, lead positioning unchanged. Large lung volumes. Tapering of the peripheral pulmonary vasculature favors emphysema. Upper normal heart size. No pleural effusion or edema. Mild thoracic spondylosis. IMPRESSION: 1.  Emphysema. 2. Atherosclerotic aortic arch. 3. Upper normal heart size. Electronically Signed   By: Van Clines M.D.   On: 07/24/2015 07:59   I have personally reviewed and evaluated these images and lab results as part of my medical decision-making.   EKG Interpretation   Date/Time:  Tuesday July 25 2015 00:18:43 EST Ventricular Rate:  110 PR Interval:    QRS Duration: 109 QT Interval:  342 QTC Calculation: 463 R Axis:   -167 Text Interpretation:  Atrial fibrillation Anterolateral infarct, old No  significant change since last tracing Confirmed by Levie Wages  MD,  Laelle Bridgett [phone]) on 07/25/2015 12:42:58 AM      MDM   Final diagnoses:  Chest pain, unspecified chest pain type    Patient presents to the emergency department with complaints of chest pain. He has tenderness to palpation of the left chest wall. Patient has been seen multiple times in the ER for these complaints. His workup has been negative here in the ER. He does not require hospitalization or further workup for these chronic complaints.  I personally performed the services  described in this documentation, which was scribed in my presence. The recorded information has been reviewed and is accurate.      Orpah Greek, MD 07/25/15 [phone]

## 2015-07-25 NOTE — ED Notes (Signed)
Pt left with all his belongings and ambulated out of the treatment area.  

## 2015-07-25 NOTE — Discharge Instructions (Signed)
Nonspecific Chest Pain  °Chest pain can be caused by many different conditions. There is always a chance that your pain could be related to something serious, such as a heart attack or a blood clot in your lungs. Chest pain can also be caused by conditions that are not life-threatening. If you have chest pain, it is very important to follow up with your health care provider. °CAUSES  °Chest pain can be caused by: °· Heartburn. °· Pneumonia or bronchitis. °· Anxiety or stress. °· Inflammation around your heart (pericarditis) or lung (pleuritis or pleurisy). °· A blood clot in your lung. °· A collapsed lung (pneumothorax). It can develop suddenly on its own (spontaneous pneumothorax) or from trauma to the chest. °· Shingles infection (varicella-zoster virus). °· Heart attack. °· Damage to the bones, muscles, and cartilage that make up your chest wall. This can include: °¨ Bruised bones due to injury. °¨ Strained muscles or cartilage due to frequent or repeated coughing or overwork. °¨ Fracture to one or more ribs. °¨ Sore cartilage due to inflammation (costochondritis). °RISK FACTORS  °Risk factors for chest pain may include: °· Activities that increase your risk for trauma or injury to your chest. °· Respiratory infections or conditions that cause frequent coughing. °· Medical conditions or overeating that can cause heartburn. °· Heart disease or family history of heart disease. °· Conditions or health behaviors that increase your risk of developing a blood clot. °· Having had chicken pox (varicella zoster). °SIGNS AND SYMPTOMS °Chest pain can feel like: °· Burning or tingling on the surface of your chest or deep in your chest. °· Crushing, pressure, aching, or squeezing pain. °· Dull or sharp pain that is worse when you move, cough, or take a deep breath. °· Pain that is also felt in your back, neck, shoulder, or arm, or pain that spreads to any of these areas. °Your chest pain may come and go, or it may stay  constant. °DIAGNOSIS °Lab tests or other studies may be needed to find the cause of your pain. Your health care provider may have you take a test called an ambulatory ECG (electrocardiogram). An ECG records your heartbeat patterns at the time the test is performed. You may also have other tests, such as: °· Transthoracic echocardiogram (TTE). During echocardiography, sound waves are used to create a picture of all of the heart structures and to look at how blood flows through your heart. °· Transesophageal echocardiogram (TEE). This is a more advanced imaging test that obtains images from inside your body. It allows your health care provider to see your heart in finer detail. °· Cardiac monitoring. This allows your health care provider to monitor your heart rate and rhythm in real time. °· Holter monitor. This is a portable device that records your heartbeat and can help to diagnose abnormal heartbeats. It allows your health care provider to track your heart activity for several days, if needed. °· Stress tests. These can be done through exercise or by taking medicine that makes your heart beat more quickly. °· Blood tests. °· Imaging tests. °TREATMENT  °Your treatment depends on what is causing your chest pain. Treatment may include: °· Medicines. These may include: °¨ Acid blockers for heartburn. °¨ Anti-inflammatory medicine. °¨ Pain medicine for inflammatory conditions. °¨ Antibiotic medicine, if an infection is present. °¨ Medicines to dissolve blood clots. °¨ Medicines to treat coronary artery disease. °· Supportive care for conditions that do not require medicines. This may include: °¨ Resting. °¨ Applying heat   or cold packs to injured areas. °¨ Limiting activities until pain decreases. °HOME CARE INSTRUCTIONS °· If you were prescribed an antibiotic medicine, finish it all even if you start to feel better. °· Avoid any activities that bring on chest pain. °· Do not use any tobacco products, including  cigarettes, chewing tobacco, or electronic cigarettes. If you need help quitting, ask your health care provider. °· Do not drink alcohol. °· Take medicines only as directed by your health care provider. °· Keep all follow-up visits as directed by your health care provider. This is important. This includes any further testing if your chest pain does not go away. °· If heartburn is the cause for your chest pain, you may be told to keep your head raised (elevated) while sleeping. This reduces the chance that acid will go from your stomach into your esophagus. °· Make lifestyle changes as directed by your health care provider. These may include: °¨ Getting regular exercise. Ask your health care provider to suggest some activities that are safe for you. °¨ Eating a heart-healthy diet. A registered dietitian can help you to learn healthy eating options. °¨ Maintaining a healthy weight. °¨ Managing diabetes, if necessary. °¨ Reducing stress. °SEEK MEDICAL CARE IF: °· Your chest pain does not go away after treatment. °· You have a rash with blisters on your chest. °· You have a fever. °SEEK IMMEDIATE MEDICAL CARE IF:  °· Your chest pain is worse. °· You have an increasing cough, or you cough up blood. °· You have severe abdominal pain. °· You have severe weakness. °· You faint. °· You have chills. °· You have sudden, unexplained chest discomfort. °· You have sudden, unexplained discomfort in your arms, back, neck, or jaw. °· You have shortness of breath at any time. °· You suddenly start to sweat, or your skin gets clammy. °· You feel nauseous or you vomit. °· You suddenly feel light-headed or dizzy. °· Your heart begins to beat quickly, or it feels like it is skipping beats. °These symptoms may represent a serious problem that is an emergency. Do not wait to see if the symptoms will go away. Get medical help right away. Call your local emergency services (911 in the [place] Do not drive yourself to the hospital. °  °This  information is not intended to replace advice given to you by your health care provider. Make sure you discuss any questions you have with your health care provider. °  °Document Released: 03/06/2005 Document Revised: 06/17/2014 Document Reviewed: 12/31/2013 °Elsevier Interactive Patient Education ©2016 Elsevier Inc[employer] ° °

## 2015-07-26 ENCOUNTER — Emergency Department (HOSPITAL_COMMUNITY)
Admission: EM | Admit: 2015-07-26 | Discharge: 2015-07-27 | Disposition: A | Discharge status: Home / Self Care | Payer: Medicare Other | Source: Home / Self Care | Attending: Emergency Medicine | Admitting: Emergency Medicine

## 2015-07-26 ENCOUNTER — Encounter (HOSPITAL_COMMUNITY): Payer: Self-pay | Admitting: Vascular Surgery

## 2015-07-26 DIAGNOSIS — R059 Cough, unspecified: Secondary | ICD-10-CM

## 2015-07-26 DIAGNOSIS — R0789 Other chest pain: Secondary | ICD-10-CM

## 2015-07-26 DIAGNOSIS — Z7982 Long term (current) use of aspirin: Secondary | ICD-10-CM

## 2015-07-26 DIAGNOSIS — D500 Iron deficiency anemia secondary to blood loss (chronic): Secondary | ICD-10-CM

## 2015-07-26 DIAGNOSIS — I482 Chronic atrial fibrillation, unspecified: Secondary | ICD-10-CM

## 2015-07-26 DIAGNOSIS — F17210 Nicotine dependence, cigarettes, uncomplicated: Secondary | ICD-10-CM | POA: Insufficient documentation

## 2015-07-26 DIAGNOSIS — Z8639 Personal history of other endocrine, nutritional and metabolic disease: Secondary | ICD-10-CM

## 2015-07-26 DIAGNOSIS — D649 Anemia, unspecified: Secondary | ICD-10-CM

## 2015-07-26 DIAGNOSIS — Z79899 Other long term (current) drug therapy: Secondary | ICD-10-CM | POA: Insufficient documentation

## 2015-07-26 DIAGNOSIS — F329 Major depressive disorder, single episode, unspecified: Secondary | ICD-10-CM

## 2015-07-26 DIAGNOSIS — R05 Cough: Secondary | ICD-10-CM

## 2015-07-26 DIAGNOSIS — Z86010 Personal history of colonic polyps: Secondary | ICD-10-CM | POA: Insufficient documentation

## 2015-07-26 DIAGNOSIS — I10 Essential (primary) hypertension: Secondary | ICD-10-CM | POA: Insufficient documentation

## 2015-07-26 LAB — CBC
HCT: 38.9 % — ABNORMAL LOW (ref 39.0–52.0)
Hemoglobin: 11.8 g/dL — ABNORMAL LOW (ref 13.0–17.0)
MCH: 28.8 pg (ref 26.0–34.0)
MCHC: 30.3 g/dL (ref 30.0–36.0)
MCV: 94.9 fL (ref 78.0–100.0)
Platelets: 214 10*3/uL (ref 150–400)
RBC: 4.1 MIL/uL — ABNORMAL LOW (ref 4.22–5.81)
RDW: 14.9 % (ref 11.5–15.5)
WBC: 10 10*3/uL (ref 4.0–10.5)

## 2015-07-26 LAB — BASIC METABOLIC PANEL WITH GFR
Anion gap: 12 (ref 5–15)
BUN: 19 mg/dL (ref 6–20)
CO2: 23 mmol/L (ref 22–32)
Calcium: 9.1 mg/dL (ref 8.9–10.3)
Chloride: 104 mmol/L (ref 101–111)
Creatinine, Ser: 0.8 mg/dL (ref 0.61–1.24)
GFR calc Af Amer: >60 mL/min
GFR calc non Af Amer: >60 mL/min
Glucose, Bld: 182 mg/dL — ABNORMAL HIGH (ref 65–99)
Potassium: 5.1 mmol/L (ref 3.5–5.1)
Sodium: 139 mmol/L (ref 135–145)

## 2015-07-26 LAB — I-STAT TROPONIN, ED: Troponin i, poc: 0 ng/mL (ref 0.00–0.08)

## 2015-07-26 NOTE — ED Notes (Signed)
Pt reports to the ED for eval of CP that started this am. He reports associated symptoms of SOB. Pt has hx of emphysema. He had 324 of ASA and 2 nitros and his pain went from a 9/10 to 5/10. Pt A&Ox4, resp e/u, and skin warm and dry. Pt tachycardic in the 120s.

## 2015-07-26 NOTE — ED Provider Notes (Addendum)
CSN: [id number]     Arrival date & time 07/26/15  1853 History   By signing my name below, I, [name], attest that this documentation has been prepared under the direction and in the presence of [name], MD.  Electronically Signed: Forrestine Goodwin, ED Scribe. 07/27/2015. 12:05 AM.   Chief Complaint  Patient presents with  . Chest Pain   HPI  HPI Comments: [name] is a 70 y.o. male with a PMHx of HLD who presents to the Emergency Department complaining of constant, ongoing R sided chest pain onset just prior to arrival. Pain is described as sharp and rated 6/10. Pt also reports ongoing productive cough consisting of yellow mucous, mild shortness of breath, along with diarrhea. Discomfort is exacerbated when coughing and with pressure to the area. No OTC medications or home remedies attempted prior to arrival. No recent fever, chills, nausea, or vomiting. Pt is due to follow up with cardiology and plans to call to make an appointment tomorrow.  PCP: [name], MD    Past Medical History  Diagnosis Date  . Depression   . Fall at nursing home     December 2014  . History of migraine   . Gout attack   . Complete heart block ([place])     St. Jude pacemaker - followed by Dr. Lovena Le  . Chronic atrial fibrillation ([place])   . HLD (hyperlipidemia)   . GIB (gastrointestinal bleeding)   . BPH (benign prostatic hyperplasia)   . Essential hypertension   . Gastric ulcer   . Non Hodgkin's lymphoma ([place])   . Tubular adenoma of colon    Past Surgical History  Procedure Laterality Date  . Pacemaker generator change      Dr. Lovena Le  . Appendectomy    . Cholecystectomy    . Open reduction of hip Right 05/28/2013    Procedure: OPEN REDUCTION OF HIP;  Surgeon: Renette Butters, MD;  Location: [facility];  Service: Orthopedics;  Laterality: Right;  . Esophagogastroduodenoscopy (egd) with propofol N/A 09/26/2014    Procedure: ESOPHAGOGASTRODUODENOSCOPY (EGD) WITH PROPOFOL;  Surgeon: Jerene Bears, MD;  Location: [facility] ENDOSCOPY;  Service: Endoscopy;  Laterality: N/A;  . Colonoscopy N/A 10/01/2014    Procedure: COLONOSCOPY;  Surgeon: Jerene Bears, MD;  Location: [facility] ENDOSCOPY;  Service: Endoscopy;  Laterality: N/A;  . Partial colectomy N/A 10/05/2014    Procedure: SUBTOTAL COLECTOMY WITH ILEORECTAL ANASTOMOSIS;  Surgeon: Donnie Mesa, MD;  Location: [place];  Service: General;  Laterality: N/A;  . Flexible sigmoidoscopy N/A 11/07/2014    Procedure: FLEXIBLE SIGMOIDOSCOPY;  Surgeon: Jerene Bears, MD;  Location: [facility] ENDOSCOPY;  Service: Endoscopy;  Laterality: N/A;  . Gastric resection     Family History  Problem Relation Age of Onset  . Colon cancer Mother    Social History  Substance Use Topics  . Smoking status: Current Some Day Smoker -- 0.50 packs/day for 66 years    Types: Cigarettes  . Smokeless tobacco: Former Systems developer    Types: Snuff, Chew     Comment: 05/27/2013 "haven't used chew or snuff in ~ 30 yr"  . Alcohol Use: No    Review of Systems  All other systems reviewed and are negative.     Allergies  Review of patient's allergies indicates no known allergies.  Home Medications   Prior to Admission medications   Medication Sig Start Date End Date Taking? Authorizing Provider  albuterol (PROVENTIL HFA;VENTOLIN HFA) 108 (90 Base) MCG/ACT inhaler Inhale  1 puff into the lungs every 6 (six) hours as needed for wheezing or shortness of breath.    Historical Provider, MD  aspirin EC 81 MG tablet Take 81 mg by mouth daily.    Historical Provider, MD  carvedilol (COREG) 6.25 MG tablet Take 1 tablet (6.25 mg total) by mouth 2 (two) times daily with a meal. 11/16/14   Velvet Bathe, MD  polyethylene glycol (MIRALAX / GLYCOLAX) packet Take 17 g by mouth daily. 07/16/15   Courteney Dryden, MD  predniSONE (DELTASONE) 20 MG tablet Take 3 tablets (60 mg total) by mouth daily. 07/13/15   Everlene Balls, MD  sodium phosphate Pediatric (FLEET) 3.5-9.5 GM/59ML enema Place 66 mLs (1 enema  total) rectally once. 07/16/15   Courteney Lyn Mackuen, MD  traMADol (ULTRAM) 50 MG tablet Take 1 tablet (50 mg total) by mouth every 6 (six) hours as needed. Patient taking differently: Take 50 mg by mouth every 6 (six) hours as needed for moderate pain.  07/14/15   Etta Quill, NP   Triage Vitals: BP 128/65 mmHg  Pulse 86  Temp(Src) 100.4 F (38 C) (Oral)  Resp 24  SpO2 99%   Physical Exam  Constitutional: He is oriented to person, place, and time. He appears well-developed and well-nourished.  HENT:  Head: Normocephalic and atraumatic.  Eyes: EOM are normal. Pupils are equal, round, and reactive to light.  Neck: Normal range of motion. Neck supple. No JVD present.  Cardiovascular: Normal rate, regular rhythm and normal heart sounds.   No murmur heard. Pulmonary/Chest: Effort normal and breath sounds normal. He has no wheezes. He has no rales. He exhibits tenderness.  Tenderness to L anterior chest wall Able to reproduce the pain  Abdominal: Soft. Bowel sounds are normal. He exhibits no distension and no mass. There is no tenderness.  Musculoskeletal: Normal range of motion. He exhibits no edema.  Lymphadenopathy:    He has no cervical adenopathy.  Neurological: He is alert and oriented to person, place, and time. No cranial nerve deficit. He exhibits normal muscle tone. Coordination normal.  Skin: Skin is warm and dry. No rash noted.  Psychiatric: He has a normal mood and affect. Judgment normal.  Nursing note and vitals reviewed.   ED Course  Procedures (including critical care time)  DIAGNOSTIC STUDIES: Oxygen Saturation is 99% on [place], Normal by my interpretation.    COORDINATION OF CARE: 12:04 AM- Will order BMP, I-stat troponin, CBC, and EKG. Discussed treatment plan with pt at bedside and pt agreed to plan.     Labs Review Results for orders placed or performed during the hospital encounter of [id number]  Basic metabolic panel  Result Value Ref Range   Sodium 139 135 -  145 mmol/L   Potassium 5.1 3.5 - 5.1 mmol/L   Chloride 104 101 - 111 mmol/L   CO2 23 22 - 32 mmol/L   Glucose, Bld 182 (H) 65 - 99 mg/dL   BUN 19 6 - 20 mg/dL   Creatinine, Ser 0.80 0.61 - 1.24 mg/dL   Calcium 9.1 8.9 - 10.3 mg/dL   GFR calc non Af Amer >60 >60 mL/min   GFR calc Af Amer >60 >60 mL/min   Anion gap 12 5 - 15  CBC  Result Value Ref Range   WBC 10.0 4.0 - 10.5 K/uL   RBC 4.10 (L) 4.22 - 5.81 MIL/uL   Hemoglobin 11.8 (L) 13.0 - 17.0 g/dL   HCT 38.9 (L) 39.0 - 52.0 %  MCV 94.9 78.0 - 100.0 fL   MCH 28.8 26.0 - 34.0 pg   MCHC 30.3 30.0 - 36.0 g/dL   RDW 14.9 11.5 - 15.5 %   Platelets 214 150 - 400 K/uL  I-stat troponin, ED (not at [facility], [facility])  Result Value Ref Range   Troponin i, poc 0.00 0.00 - 0.08 ng/mL   Comment 3           I have personally reviewed and evaluated these lab results as part of my medical decision-making.   EKG Interpretation   Date/Time:  Wednesday July 26 2015 19:03:57 EST Ventricular Rate:  113 PR Interval:    QRS Duration: 88 QT Interval:  314 QTC Calculation: 430 R Axis:   127 Text Interpretation:  Atrial fibrillation with rapid ventricular response  with premature ventricular or aberrantly conducted complexes Right axis  deviation Septal infarct , age undetermined Abnormal ECG When compared  with ECG of 07/25/2015, No significant change was found Confirmed by [facility]   MD, Joseangel Nettleton ([id number]) on 07/26/2015 11:46:52 PM      MDM   Final diagnoses:  Chest wall pain  Cough  Normochromic normocytic anemia  Chronic atrial fibrillation (HCC)    Chest wall pain which appears chronic. Old records are reviewed and he has 30 other ED visits in the last 6 months, many for chest pain which appears to be chest wall pain. States pain appears to be chest wall pain. He had an x-ray yesterday so this does not need to be repeated. ECG is unchanged and troponin is normal. He has chronic anemia which is unchanged. He has a history of GI bleed, so NSAIDs  will not be used. He states that he had run out of his inhaler so was given a new prescription for this and he is advised to follow-up with cardiology. From my records, he has not been to his cardiologist in over 5 years.  I personally performed the services described in this documentation, which was scribed in my presence. The recorded information has been reviewed and is accurate.      [name], [place] [id number] [phone]  [name], MD [id number] [phone]

## 2015-07-27 MED ORDER — ALBUTEROL SULFATE HFA 108 (90 BASE) MCG/ACT IN AERS: 2.0000 | INHALATION_SPRAY | RESPIRATORY_TRACT | Status: DC | PRN

## 2015-07-27 NOTE — Discharge Instructions (Signed)
Make an appointment with the cardiologist to evaluate your pacemaker.  Chest Wall Pain Chest wall pain is pain in or around the bones and muscles of your chest. Sometimes, an injury causes this pain. Sometimes, the cause may not be known. This pain may take several weeks or longer to get better. HOME CARE INSTRUCTIONS  Pay attention to any changes in your symptoms. Take these actions to help with your pain:   Rest as told by your health care provider.   Avoid activities that cause pain. These include any activities that use your chest muscles or your abdominal and side muscles to lift heavy items.   If directed, apply ice to the painful area:  Put ice in a plastic bag.  Place a towel between your skin and the bag.  Leave the ice on for 20 minutes, 2-3 times per day.  Take over-the-counter and prescription medicines only as told by your health care provider.  Do not use tobacco products, including cigarettes, chewing tobacco, and e-cigarettes. If you need help quitting, ask your health care provider.  Keep all follow-up visits as told by your health care provider. This is important. SEEK MEDICAL CARE IF:  You have a fever.  Your chest pain becomes worse.  You have new symptoms. SEEK IMMEDIATE MEDICAL CARE IF:  You have nausea or vomiting.  You feel sweaty or light-headed.  You have a cough with phlegm (sputum) or you cough up blood.  You develop shortness of breath.   This information is not intended to replace advice given to you by your health care provider. Make sure you discuss any questions you have with your health care provider.   Document Released: 05/27/2005 Document Revised: 02/15/2015 Document Reviewed: 08/22/2014 Elsevier Interactive Patient Education 2016 Elsevier Inc.  Cough, Adult Coughing is a reflex that clears your throat and your airways. Coughing helps to heal and protect your lungs. It is normal to cough occasionally, but a cough that happens  with other symptoms or lasts a long time may be a sign of a condition that needs treatment. A cough may last only 2-3 weeks (acute), or it may last longer than 8 weeks (chronic). CAUSES Coughing is commonly caused by:  Breathing in substances that irritate your lungs.  A viral or bacterial respiratory infection.  Allergies.  Asthma.  Postnasal drip.  Smoking.  Acid backing up from the stomach into the esophagus (gastroesophageal reflux).  Certain medicines.  Chronic lung problems, including COPD (or rarely, lung cancer).  Other medical conditions such as heart failure. HOME CARE INSTRUCTIONS  Pay attention to any changes in your symptoms. Take these actions to help with your discomfort:  Take medicines only as told by your health care provider.  If you were prescribed an antibiotic medicine, take it as told by your health care provider. Do not stop taking the antibiotic even if you start to feel better.  Talk with your health care provider before you take a cough suppressant medicine.  Drink enough fluid to keep your urine clear or pale yellow.  If the air is dry, use a cold steam vaporizer or humidifier in your bedroom or your home to help loosen secretions.  Avoid anything that causes you to cough at work or at home.  If your cough is worse at night, try sleeping in a semi-upright position.  Avoid cigarette smoke. If you smoke, quit smoking. If you need help quitting, ask your health care provider.  Avoid caffeine.  Avoid alcohol.  Rest  as needed. SEEK MEDICAL CARE IF:   You have new symptoms.  You cough up pus.  Your cough does not get better after 2-3 weeks, or your cough gets worse.  You cannot control your cough with suppressant medicines and you are losing sleep.  You develop pain that is getting worse or pain that is not controlled with pain medicines.  You have a fever.  You have unexplained weight loss.  You have night sweats. SEEK IMMEDIATE  MEDICAL CARE IF:  You cough up blood.  You have difficulty breathing.  Your heartbeat is very fast.   This information is not intended to replace advice given to you by your health care provider. Make sure you discuss any questions you have with your health care provider.   Document Released: 11/23/2010 Document Revised: 02/15/2015 Document Reviewed: 08/03/2014 Elsevier Interactive Patient Education 2016 [employer].  Smoking Hazards Smoking cigarettes is extremely bad for your health. Tobacco smoke has over 200 known poisons in it. It contains the poisonous gases nitrogen oxide and carbon monoxide. There are over 60 chemicals in tobacco smoke that cause cancer. Some of the chemicals found in cigarette smoke include:   Cyanide.   Benzene.   Formaldehyde.   Methanol (wood alcohol).   Acetylene (fuel used in welding torches).   Ammonia.  Even smoking lightly shortens your life expectancy by several years. You can greatly reduce the risk of medical problems for you and your family by stopping now. Smoking is the most preventable cause of death and disease in our society. Within days of quitting smoking, your circulation improves, you decrease the risk of having a heart attack, and your lung capacity improves. There may be some increased phlegm in the first few days after quitting, and it may take months for your lungs to clear up completely. Quitting for 10 years reduces your risk of developing lung cancer to almost that of a nonsmoker.  WHAT ARE THE RISKS OF SMOKING? Cigarette smokers have an increased risk of many serious medical problems, including:  Lung cancer.   Lung disease (such as pneumonia, bronchitis, and emphysema).   Heart attack and chest pain due to the heart not getting enough oxygen (angina).   Heart disease and peripheral blood vessel disease.   Hypertension.   Stroke.   Oral cancer (cancer of the lip, mouth, or voice box).   Bladder cancer.    Pancreatic cancer.   Cervical cancer.   Pregnancy complications, including premature birth.   Stillbirths and smaller newborn babies, birth defects, and genetic damage to sperm.   Early menopause.   Lower estrogen level for women.   Infertility.   Facial wrinkles.   Blindness.   Increased risk of broken bones (fractures).   Senile dementia.   Stomach ulcers and internal bleeding.   Delayed wound healing and increased risk of complications during surgery. Because of secondhand smoke exposure, children of smokers have an increased risk of the following:   Sudden infant death syndrome (SIDS).   Respiratory infections.   Lung cancer.   Heart disease.   Ear infections.  WHY IS SMOKING ADDICTIVE? Nicotine is the chemical agent in tobacco that is capable of causing addiction or dependence. When you smoke and inhale, nicotine is absorbed rapidly into the bloodstream through your lungs. Both inhaled and noninhaled nicotine may be addictive.  WHAT ARE THE BENEFITS OF QUITTING?  There are many health benefits to quitting smoking. Some are:   The likelihood of developing cancer and heart disease  decreases. Health improvements are seen almost immediately.   Blood pressure, pulse rate, and breathing patterns start returning to normal soon after quitting.   People who quit may see an improvement in their overall quality of life.  HOW DO YOU QUIT SMOKING? Smoking is an addiction with both physical and psychological effects, and longtime habits can be hard to change. Your health care provider can recommend:  Programs and community resources, which may include group support, education, or therapy.  Replacement products, such as patches, gum, and nasal sprays. Use these products only as directed. Do not replace cigarette smoking with electronic cigarettes (commonly called e-cigarettes). The safety of e-cigarettes is unknown, and some may contain harmful  chemicals. FOR MORE INFORMATION  American Lung Association: www.lung.org  American Cancer Society: www.cancer.org   This information is not intended to replace advice given to you by your health care provider. Make sure you discuss any questions you have with your health care provider.   Document Released: 07/04/2004 Document Revised: 03/17/2013 Document Reviewed: 11/16/2012 Elsevier Interactive Patient Education [employer].

## 2015-07-27 NOTE — ED Notes (Signed)
Dr. Glick at bedside.  

## 2015-07-27 NOTE — ED Notes (Signed)
Pt given sandwich and drink and wheeled to the waiting room.

## 2015-07-28 ENCOUNTER — Observation Stay (HOSPITAL_COMMUNITY): Payer: Medicare Other

## 2015-07-28 ENCOUNTER — Encounter (HOSPITAL_COMMUNITY): Payer: Self-pay | Admitting: *Deleted

## 2015-07-28 ENCOUNTER — Inpatient Hospital Stay (HOSPITAL_COMMUNITY)
Admission: EM | Admit: 2015-07-28 | Discharge: 2015-07-30 | DRG: 393 | Discharge status: Against Medical Advice | Payer: Medicare Other | Attending: Internal Medicine | Admitting: Internal Medicine

## 2015-07-28 ENCOUNTER — Encounter (HOSPITAL_COMMUNITY): Payer: Self-pay | Admitting: Family Medicine

## 2015-07-28 ENCOUNTER — Emergency Department (HOSPITAL_COMMUNITY)
Admission: EM | Admit: 2015-07-28 | Discharge: 2015-07-28 | Discharge status: Against Medical Advice | Payer: Medicare Other | Source: Home / Self Care

## 2015-07-28 DIAGNOSIS — R079 Chest pain, unspecified: Secondary | ICD-10-CM

## 2015-07-28 DIAGNOSIS — K922 Gastrointestinal hemorrhage, unspecified: Secondary | ICD-10-CM | POA: Diagnosis not present

## 2015-07-28 DIAGNOSIS — I482 Chronic atrial fibrillation: Secondary | ICD-10-CM

## 2015-07-28 DIAGNOSIS — K621 Rectal polyp: Secondary | ICD-10-CM | POA: Diagnosis not present

## 2015-07-28 DIAGNOSIS — D62 Acute posthemorrhagic anemia: Secondary | ICD-10-CM | POA: Diagnosis present

## 2015-07-28 DIAGNOSIS — I4891 Unspecified atrial fibrillation: Secondary | ICD-10-CM | POA: Diagnosis present

## 2015-07-28 DIAGNOSIS — I10 Essential (primary) hypertension: Secondary | ICD-10-CM | POA: Insufficient documentation

## 2015-07-28 DIAGNOSIS — D128 Benign neoplasm of rectum: Secondary | ICD-10-CM | POA: Insufficient documentation

## 2015-07-28 DIAGNOSIS — F17200 Nicotine dependence, unspecified, uncomplicated: Secondary | ICD-10-CM

## 2015-07-28 DIAGNOSIS — K625 Hemorrhage of anus and rectum: Secondary | ICD-10-CM | POA: Diagnosis not present

## 2015-07-28 LAB — BASIC METABOLIC PANEL
ANION GAP: 10 (ref 5–15)
BUN: 30 mg/dL — AB (ref 6–20)
CALCIUM: 9.1 mg/dL (ref 8.9–10.3)
CO2: 22 mmol/L (ref 22–32)
Chloride: 108 mmol/L (ref 101–111)
Creatinine, Ser: 0.83 mg/dL (ref 0.61–1.24)
GFR calc Af Amer: >60 mL/min (ref >60)
GLUCOSE: 125 mg/dL — AB (ref 65–99)
Potassium: 4.4 mmol/L (ref 3.5–5.1)
Sodium: 140 mmol/L (ref 135–145)

## 2015-07-28 LAB — CBC
HCT: 32.7 % — ABNORMAL LOW (ref 39.0–52.0)
Hemoglobin: 10.3 g/dL — ABNORMAL LOW (ref 13.0–17.0)
MCH: 29.4 pg (ref 26.0–34.0)
MCHC: 31.5 g/dL (ref 30.0–36.0)
MCV: 93.4 fL (ref 78.0–100.0)
PLATELETS: 186 10*3/uL (ref 150–400)
RBC: 3.5 MIL/uL — ABNORMAL LOW (ref 4.22–5.81)
RDW: 15.2 % (ref 11.5–15.5)
WBC: 5.2 10*3/uL (ref 4.0–10.5)

## 2015-07-28 LAB — POC OCCULT BLOOD, ED: Fecal Occult Bld: POSITIVE — AB

## 2015-07-28 MED ORDER — SODIUM CHLORIDE 0.9 % IV BOLUS (SEPSIS)
1000.0000 mL | Freq: Once | INTRAVENOUS | Status: AC
  Administered 2015-07-28: 1000 mL via INTRAVENOUS

## 2015-07-28 MED ORDER — ONDANSETRON HCL 4 MG PO TABS: 4.0000 mg | ORAL_TABLET | Freq: Four times a day (QID) | ORAL | Status: DC | PRN

## 2015-07-28 MED ORDER — CARVEDILOL 6.25 MG PO TABS
6.2500 mg | ORAL_TABLET | Freq: Two times a day (BID) | ORAL | Status: DC
  Administered 2015-07-29 – 2015-07-30 (×3): 6.25 mg via ORAL
  Filled 2015-07-28 (×6): qty 1

## 2015-07-28 MED ORDER — ONDANSETRON HCL 4 MG/2ML IJ SOLN: 4.0000 mg | Freq: Four times a day (QID) | INTRAMUSCULAR | Status: DC | PRN

## 2015-07-28 MED ORDER — PANTOPRAZOLE SODIUM 40 MG IV SOLR
40.0000 mg | Freq: Two times a day (BID) | INTRAVENOUS | Status: DC
  Administered 2015-07-29 (×3): 40 mg via INTRAVENOUS
  Filled 2015-07-28 (×4): qty 40

## 2015-07-28 MED ORDER — IPRATROPIUM BROMIDE 0.02 % IN SOLN
0.5000 mg | RESPIRATORY_TRACT | Status: DC | PRN
  Administered 2015-07-29: 0.5 mg via RESPIRATORY_TRACT
  Filled 2015-07-28: qty 2.5

## 2015-07-28 MED ORDER — SODIUM CHLORIDE 0.9 % IV SOLN
INTRAVENOUS | Status: DC
  Administered 2015-07-29 – 2015-07-30 (×3): via INTRAVENOUS

## 2015-07-28 MED ORDER — ALBUTEROL SULFATE (2.5 MG/3ML) 0.083% IN NEBU
2.5000 mg | INHALATION_SOLUTION | RESPIRATORY_TRACT | Status: DC | PRN
  Administered 2015-07-29 – 2015-07-30 (×3): 2.5 mg via RESPIRATORY_TRACT
  Filled 2015-07-28 (×3): qty 3

## 2015-07-28 MED ORDER — SODIUM CHLORIDE 0.9% FLUSH: 3.0000 mL | Freq: Two times a day (BID) | INTRAVENOUS | Status: DC

## 2015-07-28 MED ORDER — MORPHINE SULFATE (PF) 2 MG/ML IV SOLN
1.0000 mg | INTRAVENOUS | Status: DC | PRN
  Administered 2015-07-29 – 2015-07-30 (×5): 1 mg via INTRAVENOUS
  Filled 2015-07-28 (×5): qty 1

## 2015-07-28 NOTE — Progress Notes (Signed)
This patient has an ED care plan.  Patient noted to have had 29 ED visits within the last six months.  This EDCM spoke to patient on 02/06 and provided community resources for pcp assist and medication assistance.  Patient also given contact information to the [facility].  Patient was also seen by EDSW at that time and provided homeless resources.  Patient is listed as having Medicare and Medicaid insurance. Pcp listed is Dr. Mallie Mussel Trip.  No further EDCM needs at this time.

## 2015-07-28 NOTE — ED Notes (Addendum)
Pt states he is having rectal bleeding to EMS, EMS was called to bring another pt in to facility, he wanted to come too. Just left [facility] at 1402 today

## 2015-07-28 NOTE — ED Provider Notes (Signed)
CSN: [id number]     Arrival date & time 07/28/15  1818 History   First MD Initiated Contact with Patient 07/28/15 2144     Chief Complaint  Patient presents with  . Rectal Bleeding   HPI   [name] is a 70 y.o. male PMH significant for depression, hyperlipidemia, and GI bleed, gastric ulcer, hypertension, BPH presenting with one episode of rectal bleeding around 1 pm today. He states he noticed some dark red blood (without clots) in his stool this morning. He describes the blood as a little less than a tablespoon. He states the blood was surrounding his stool, and the commode, and on the tissue paper. He states he's had a history of rectal bleeding "since 1980 when they removed part of my colon." He endorses chest pain (chronic in nature), chronic weakness. He was seen in the ED today for chest pain with a negative workup. He denies fevers, chills, shortness of breath, abdominal pain. Last colonoscopy was April 2016 that revealed cecal and rectal masses.  Past Medical History  Diagnosis Date  . Depression   . Fall at nursing home     December 2014  . History of migraine   . Gout attack   . Complete heart block ([place])     St. Jude pacemaker - followed by Dr. Lovena Le  . Chronic atrial fibrillation ([place])   . HLD (hyperlipidemia)   . GIB (gastrointestinal bleeding)   . BPH (benign prostatic hyperplasia)   . Essential hypertension   . Gastric ulcer   . Non Hodgkin's lymphoma ([place])   . Tubular adenoma of colon    Past Surgical History  Procedure Laterality Date  . Pacemaker generator change      Dr. Lovena Le  . Appendectomy    . Cholecystectomy    . Open reduction of hip Right 05/28/2013    Procedure: OPEN REDUCTION OF HIP;  Surgeon: Renette Butters, MD;  Location: [facility];  Service: Orthopedics;  Laterality: Right;  . Esophagogastroduodenoscopy (egd) with propofol N/A 09/26/2014    Procedure: ESOPHAGOGASTRODUODENOSCOPY (EGD) WITH PROPOFOL;  Surgeon: Jerene Bears, MD;  Location: [facility]  ENDOSCOPY;  Service: Endoscopy;  Laterality: N/A;  . Colonoscopy N/A 10/01/2014    Procedure: COLONOSCOPY;  Surgeon: Jerene Bears, MD;  Location: [facility] ENDOSCOPY;  Service: Endoscopy;  Laterality: N/A;  . Partial colectomy N/A 10/05/2014    Procedure: SUBTOTAL COLECTOMY WITH ILEORECTAL ANASTOMOSIS;  Surgeon: Donnie Mesa, MD;  Location: [facility];  Service: General;  Laterality: N/A;  . Flexible sigmoidoscopy N/A 11/07/2014    Procedure: FLEXIBLE SIGMOIDOSCOPY;  Surgeon: Jerene Bears, MD;  Location: [facility] ENDOSCOPY;  Service: Endoscopy;  Laterality: N/A;  . Gastric resection     Family History  Problem Relation Age of Onset  . Colon cancer Mother    Social History  Substance Use Topics  . Smoking status: Current Some Day Smoker -- 0.50 packs/day for 66 years    Types: Cigarettes  . Smokeless tobacco: Former Systems developer    Types: Snuff, Chew     Comment: 05/27/2013 "haven't used chew or snuff in ~ 30 yr"  . Alcohol Use: No    Review of Systems  Ten systems are reviewed and are negative for acute change except as noted in the HPI  Allergies  Review of patient's allergies indicates no known allergies.  Home Medications   Prior to Admission medications   Medication Sig Start Date End Date Taking? Authorizing Provider  aspirin EC 81 MG tablet Take  81 mg by mouth daily.   Yes Historical Provider, MD  carvedilol (COREG) 6.25 MG tablet Take 1 tablet (6.25 mg total) by mouth 2 (two) times daily with a meal. 11/16/14  Yes Velvet Bathe, MD  polyethylene glycol (MIRALAX / GLYCOLAX) packet Take 17 g by mouth daily. 07/16/15  Yes Courteney Lyn Mackuen, MD  predniSONE (DELTASONE) 20 MG tablet Take 3 tablets (60 mg total) by mouth daily. 07/13/15  Yes Everlene Balls, MD  traMADol (ULTRAM) 50 MG tablet Take 1 tablet (50 mg total) by mouth every 6 (six) hours as needed. Patient taking differently: Take 50 mg by mouth every 6 (six) hours as needed for moderate pain.  07/14/15  Yes Etta Quill, NP  albuterol (PROVENTIL  HFA;VENTOLIN HFA) 108 (90 Base) MCG/ACT inhaler Inhale 2 puffs into the lungs every 4 (four) hours as needed for wheezing or shortness of breath. [id number]   [name], MD  sodium phosphate Pediatric (FLEET) 3.5-9.5 GM/59ML enema Place 66 mLs (1 enema total) rectally once. 07/16/15   Courteney Lyn Mackuen, MD   BP 136/78 mmHg  Pulse 74  Temp(Src) 98.4 F (36.9 C) (Oral)  Resp 18  SpO2 100% Physical Exam  Constitutional: He appears well-developed and well-nourished. No distress.  HENT:  Head: Normocephalic and atraumatic.  Mouth/Throat: Oropharynx is clear and moist. No oropharyngeal exudate.  Eyes: Conjunctivae are normal. Right eye exhibits no discharge. Left eye exhibits no discharge. No scleral icterus.  Neck: No tracheal deviation present.  Cardiovascular: Normal rate, regular rhythm, normal heart sounds and intact distal pulses.  Exam reveals no gallop and no friction rub.   No murmur heard. Pulmonary/Chest: Effort normal and breath sounds normal. No respiratory distress. He has no wheezes. He has no rales. He exhibits no tenderness.  Abdominal: Soft. Bowel sounds are normal. He exhibits no distension and no mass. There is no tenderness. There is no rebound and no guarding.  Genitourinary:  Rectal exam: DRE negative without mass, lesions or tenderness. Light brown stool noted on glove. No frank blood.  Chaperone present   Musculoskeletal: He exhibits no edema.  Lymphadenopathy:    He has no cervical adenopathy.  Neurological: He is alert. Coordination normal.  Skin: Skin is warm and dry. No rash noted. He is not diaphoretic. No erythema.  Psychiatric: He has a normal mood and affect. His behavior is normal.  Nursing note and vitals reviewed.   ED Course  Procedures  Labs Review Labs Reviewed  CBC - Abnormal; Notable for the following:    RBC 3.50 (*)    Hemoglobin 10.3 (*)    HCT 32.7 (*)    All other components within normal limits  POC OCCULT BLOOD, ED - Abnormal;  Notable for the following:    Fecal Occult Bld POSITIVE (*)    All other components within normal limits  BASIC METABOLIC PANEL  OCCULT BLOOD X 1 CARD TO LAB, STOOL    MDM   Final diagnoses:  Gastrointestinal hemorrhage, unspecified gastritis, unspecified gastrointestinal hemorrhage type   Patient nontoxic appearing, vital signs stable. Normal rectal exam. Patient with chronic history of GI bleeding; however, he states that he is worried because he usually does not have this much bleeding. Hemoccult positive. Hemoglobin at baseline; however, with only one episode of bleeding doubt this would affect the hemoglobin at this point. Discussed case with Dr. Oleta Mouse who advised hospital admission. Dr. Tamala Julian to admission. Patient will be admitted to telemetry observation; he advises telemetry due to history of a-fib. Patient  in understanding and agreement with the plan.  [place]o Lions, PA-C 07/28/15 [place] Liu, MD 07/29/15 1226

## 2015-07-28 NOTE — ED Notes (Signed)
Secretary advised that pt had left AMA.  Did not see pt leave, but can not find him in waiting room.

## 2015-07-28 NOTE — H&P (Signed)
[facility] History and Physical  [name] [id number] DOB: [birth year] DOA: 07/28/2015  Referring physician: ED PCP: [name], MD   Chief Complaint: rectal bleeding  HPI:  [name] is a 70 y.o. 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 Onset of symptoms occurred around 1 PM today. Patient noted dark red blood in his stool over about 1 cap full. He states he has had this history of rectal bleeding since they removed part of his colon. Endorses associated symptoms of periumbilical abdominal pain over his previous surgical scar. Also endorses chronic complaints of shortness of breath, chest pain, and generalized pain. Subsequently in asking the patient again about his bowel movements he states that he's had 20 bowel movements today with runny dark red blood. He denies having any lightheadedness or focal weakness. His last colonoscopy was in 09/2014. It appears he was just discharged yesterday after a negative workup for chest pain.  In emergency department, patient's vital signs were within normal limits. Blood work notable for hemoglobin drop from 11.8 to 10.3 in just two days ,Cr 0.83, BUN 30, platelets 186K, otherwise unremarkable. FOBT +. Chest x-ray consistent with COPD but no active disease.   Review of Systems  Constitutional: Positive for weight loss. Negative for chills.  HENT: Negative for ear discharge and ear pain.   Eyes: Negative for photophobia and pain.  Respiratory: Positive for shortness of breath.   Cardiovascular: Positive for chest pain.  Gastrointestinal: Positive for abdominal pain and blood in stool.  Genitourinary: Negative for urgency and frequency.  Musculoskeletal:  Positive for joint pain. Negative for back pain.  Skin: Negative for itching and rash.  Neurological: Negative for sensory change, speech change and weakness.  Endo/Heme/Allergies: Negative for environmental allergies. Bruises/bleeds easily.  Psychiatric/Behavioral: Negative for memory loss.    Past Medical History  Diagnosis Date  . Depression   . Fall at nursing home     December 2014  . History of migraine   . Gout attack   . Complete heart block ([place])     St. Jude pacemaker - followed by Dr. Lovena Le  . Chronic atrial fibrillation ([place])   . HLD (hyperlipidemia)   . GIB (gastrointestinal bleeding)   . BPH (benign prostatic hyperplasia)   . Essential hypertension   . Gastric ulcer   . Non Hodgkin's lymphoma ([place])   . Tubular adenoma of colon      Past Surgical History  Procedure Laterality Date  . Pacemaker generator change      Dr. Lovena Le  . Appendectomy    . Cholecystectomy    . Open reduction of hip Right 05/28/2013    Procedure: OPEN REDUCTION OF HIP;  Surgeon: Renette Butters, MD;  Location: [place];  Service: Orthopedics;  Laterality: Right;  . Esophagogastroduodenoscopy (egd) with propofol N/A 09/26/2014    Procedure: ESOPHAGOGASTRODUODENOSCOPY (EGD) WITH PROPOFOL;  Surgeon: Jerene Bears, MD;  Location: [facility] ENDOSCOPY;  Service: Endoscopy;  Laterality: N/A;  . Colonoscopy N/A 10/01/2014    Procedure: COLONOSCOPY;  Surgeon: Jerene Bears, MD;  Location: [facility] ENDOSCOPY;  Service: Endoscopy;  Laterality: N/A;  . Partial colectomy N/A 10/05/2014    Procedure: SUBTOTAL COLECTOMY WITH ILEORECTAL ANASTOMOSIS;  Surgeon: Donnie Mesa, MD;  Location: [place];  Service: General;  Laterality: N/A;  . Flexible sigmoidoscopy N/A 11/07/2014    Procedure: FLEXIBLE SIGMOIDOSCOPY;  Surgeon: Jerene Bears, MD;  Location: [facility] ENDOSCOPY;  Service: Endoscopy;  Laterality: N/A;  . Gastric resection        Social History:  reports that he has been smoking Cigarettes.  He has a 33 pack-year smoking  history. He has quit using smokeless tobacco. His smokeless tobacco use included Snuff and Chew. He reports that he uses illicit drugs (Marijuana). He reports that he does not drink alcohol.  No Known Allergies  Family History  Problem Relation Age of Onset  . Colon cancer Mother       Prior to Admission medications   Medication Sig Start Date End Date Taking? Authorizing Provider  aspirin EC 81 MG tablet Take 81 mg by mouth daily.   Yes Historical Provider, MD  carvedilol (COREG) 6.25 MG tablet Take 1 tablet (6.25 mg total) by mouth 2 (two) times daily with a meal. 11/16/14  Yes Velvet Bathe, MD  polyethylene glycol (MIRALAX / GLYCOLAX) packet Take 17 g by mouth daily. 07/16/15  Yes Courteney Lyn Mackuen, MD  predniSONE (DELTASONE) 20 MG tablet Take 3 tablets (60 mg total) by mouth daily. 07/13/15  Yes Everlene Balls, MD  traMADol (ULTRAM) 50 MG tablet Take 1 tablet (50 mg total) by mouth every 6 (six) hours as needed. Patient taking differently: Take 50 mg by mouth every 6 (six) hours as needed for moderate pain.  07/14/15  Yes Etta Quill, NP  albuterol (PROVENTIL HFA;VENTOLIN HFA) 108 (90 Base) MCG/ACT inhaler Inhale 2 puffs into the lungs every 4 (four) hours as needed for wheezing or shortness of breath. [id number]   [name], MD  sodium phosphate Pediatric (FLEET) 3.5-9.5 GM/59ML enema Place 66 mLs (1 enema total) rectally once. 07/16/15   Courteney Lyn Mackuen, MD     Physical Exam: Filed Vitals:   07/28/15 1831 07/28/15 2154  BP: 148/82 136/78  Pulse: 70 74  Temp: 98.4 F (36.9 C)   TempSrc: Oral   Resp: 16 18  SpO2: 100% 100%     Constitutional: Vital signs reviewed. Patient is a thin dishevel male who appears to be in no acute distress. Alert and oriented x3.  Head: Normocephalic and atraumatic  Ear: TM normal bilaterally  Mouth: no erythema or exudates, MMM  Eyes: PERRL, EOMI, conjunctivae normal, No scleral icterus.  Neck: Supple, Trachea midline normal ROM, No JVD, mass,  thyromegaly, or carotid bruit present.  Cardiovascular: Irregular irregular  Pulmonary/Chest:  Mild expiatory wheezes Abdominal: Soft. Hyperactive bowel sounds, mild tenderness to palpation lower abdomen. No guarding or rebound. GU: no CVA tenderness Musculoskeletal: No joint deformities, erythema, or stiffness, ROM full and no nontender Ext: no edema and no cyanosis, pulses palpable bilaterally (DP and PT)  Hematology: no cervical, inginal, or axillary adenopathy.  Neurological: A&O x3, Strenght is normal and symmetric bilaterally, cranial nerve II-XII are grossly intact, no focal motor deficit, sensory intact to light touch bilaterally.  Skin: Warm, dry and intact. No rash, cyanosis, or clubbing.  Psychiatric: ? delusions     Data Review   Micro Results No results found for this or any previous visit (from the past 240 hour(s)).  Radiology Reports Dg Chest 2 View  07/24/2015  CLINICAL DATA:  Chest pain intermittent for several days. EXAM: CHEST  2 VIEW COMPARISON:  07/18/2015 FINDINGS: Atherosclerotic aortic arch. Dual lead pacer, lead positioning unchanged. Large lung volumes. Tapering of the peripheral pulmonary vasculature  favors emphysema. Upper normal heart size. No pleural effusion or edema. Mild thoracic spondylosis. IMPRESSION: 1. Emphysema. 2. Atherosclerotic aortic arch. 3. Upper normal heart size. Electronically Signed   By: Van Clines M.D.   On: 07/24/2015 07:59   Dg Chest 2 View  07/18/2015  CLINICAL DATA:  Left chest pain for 1 day. EXAM: CHEST  2 VIEW COMPARISON:  07/15/2015. FINDINGS: Normal sized heart. Clear lungs. Stable right subclavian pacemaker leads. Mild thoracic spine degenerative changes. IMPRESSION: No acute abnormality. Electronically Signed   By: Claudie Revering M.D.   On: 07/18/2015 20:35   Dg Chest 2 View  07/15/2015  CLINICAL DATA:  Chest pain and shortness of breath.  No radiation. EXAM: CHEST  2 VIEW COMPARISON:  07/12/2015 FINDINGS: The lungs are  hyperinflated likely secondary to COPD. There is no focal parenchymal opacity. There is no pleural effusion or pneumothorax. The heart and mediastinal contours are unremarkable. There is a 2 lead cardiac pacemaker. The osseous structures are unremarkable. IMPRESSION: No active cardiopulmonary disease. Electronically Signed   By: Kathreen Devoid   On: 07/15/2015 16:07   Dg Chest 2 View  07/12/2015  CLINICAL DATA:  Left-sided non radiating chest pain worsening today. EXAM: CHEST  2 VIEW COMPARISON:  04/13/2015 FINDINGS: Dual-chamber pacer from the right with leads in unchanged position. Normal heart size and mediastinal contours. There is no edema, consolidation, effusion, or pneumothorax. No osseous findings to explain acute chest pain IMPRESSION: No acute finding Electronically Signed   By: Monte Fantasia M.D.   On: 07/12/2015 23:39   Ct Abdomen Pelvis W Contrast  07/16/2015  CLINICAL DATA:  Pt states he started having bloody stools last night. Hx of same. Pt was here yesterday for chest pain No oral per ER MD 120ml omni 300^133mL OMNIPAQUE IOHEXOL 300 MG/ML SOLN EXAM: CT ABDOMEN AND PELVIS WITH CONTRAST TECHNIQUE: Multidetector CT imaging of the abdomen and pelvis was performed using the standard protocol following bolus administration of intravenous contrast. CONTRAST:  163mL OMNIPAQUE IOHEXOL 300 MG/ML  SOLN COMPARISON:  03/02/2015 and prior study 10/27/2006 FINDINGS: Lower chest: Heart is mildly enlarged. Transvenous pacemaker lead to the right ventricle. Coronary stent is visible. Upper abdomen: No focal abnormality identified within the liver, spleen, pancreas. The gallbladder is surgically absent. There is mild and stable dilatation of the intra and extrahepatic biliary ducts. Stable right adrenal lesion is 0.8 x 1.6 cm and is low in attenuation. The left adrenal gland has a normal appearance. There are bilateral renal cysts. No hydronephrosis. Gastrointestinal tract: The stomach is less distended than on  the previous exam. Small bowel loops descends into the right mid abdomen consistent with small bowel malrotation. Patient has undergone partial colectomy. The distal colonic loops, sigmoid, and rectum are distended with of large amount of stool. Pelvis: Urinary bladder has a normal appearance. Prostate gland has a normal appearance. No free pelvic fluid. Retroperitoneum: There is marked atherosclerotic calcification of the abdominal aorta. Suspect at least partial occlusion of the right common iliac artery at the bifurcation. This is later reconstituted. There is a vascular contrast within the superior and inferior mesenteric arteries as well as the celiac axis. No retroperitoneal or mesenteric adenopathy. Abdominal wall: Unremarkable. Osseous structures: Previous right hip ORIF. Mild degenerative changes are seen in the lumbar spine. No suspicious lytic or blastic lesions are identified. IMPRESSION: 1. Significant stool within the distal colonic loops consistent with impaction. No definite mass is identified as a cause for obstruction. 2. Marked atherosclerosis of the  abdominal aorta and iliac arteries. 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 Clinical correlation is recommended regarding possible right lower extremity ischemic symptoms. 3. Status post partial colectomy. 4. Cardiomegaly.  Coronary stent. 5. Status post cholecystectomy. Stable appearance of mildly dilated intra and extrahepatic biliary ducts. 6. Stable right adrenal lesion, consistent with a benign adenoma given the long-term stability. 7. Bilateral renal cysts. 8. Partial small bowel malrotation. Electronically Signed   By: Nolon Nations M.D.   On: 07/16/2015 19:10     CBC  Recent Labs Lab 07/24/15 0719 07/25/15 0045 07/26/15 1924 07/28/15 2221  WBC 9.3 7.6 10.0 5.2  HGB 11.4* 10.5* 11.8* 10.3*  HCT 36.2* 33.6* 38.9* 32.7*  PLT 167 169 214 186  MCV 93.8 91.6 94.9 93.4  MCH 29.5 28.6 28.8 29.4  MCHC 31.5 31.3  30.3 31.5  RDW 15.0 14.8 14.9 15.2    Chemistries   Recent Labs Lab 07/24/15 0719 07/25/15 0045 07/26/15 1924 07/28/15 2222  NA 139 136 139 140  K 4.5 3.7 5.1 4.4  CL 102 103 104 108  CO2 21* 22 23 22   GLUCOSE 100* 113* 182* 125*  BUN 31* 20 19 30*  CREATININE 0.71 0.69 0.80 0.83  CALCIUM 8.9 8.9 9.1 9.1   ------------------------------------------------------------------------------------------------------------------ estimated creatinine clearance is 60.8 mL/min (by C-G formula based on Cr of 0.83). ------------------------------------------------------------------------------------------------------------------ No results for input(s): HGBA1C in the last 72 hours. ------------------------------------------------------------------------------------------------------------------ No results for input(s): CHOL, HDL, LDLCALC, TRIG, CHOLHDL, LDLDIRECT in the last 72 hours. ------------------------------------------------------------------------------------------------------------------ No results for input(s): TSH, T4TOTAL, T3FREE, THYROIDAB in the last 72 hours.  Invalid input(s): FREET3 ------------------------------------------------------------------------------------------------------------------ No results for input(s): VITAMINB12, FOLATE, FERRITIN, TIBC, IRON, RETICCTPCT in the last 72 hours.  Coagulation profile No results for input(s): INR, PROTIME in the last 168 hours.  No results for input(s): DDIMER in the last 72 hours.  Cardiac Enzymes  Recent Labs Lab 07/25/15 0045  TROPONINI <0.03   ------------------------------------------------------------------------------------------------------------------ Invalid input(s): POCBNP   CBG: No results for input(s): GLUCAP in the last 168 hours.     EKG: Independently reviewed. Atrial fibrillation with rate 91   Assessment/Plan   GIB (gastrointestinal bleeding): Acute. Pt reports either 1 to 20 dark red  bowel movements over the course of the day. The patient's hbg dropped form 11.8 down to 10.3 in two days. PE revealed that patient was fecal occult positive. - Admit to a telemetry bed - Type and screen for blood products - NPO - IV fluids of normal saline at 100 ml/ hour - Protonix 40 mg IV twice a day - Consult GI in the am  Acute blood loss anemia: as seen above. - H&H every 6 hours for now  - Will transfuse patient PRBC if patient becomes symptomatic or hemoglobin drops below 7.  Atrial fibrillation [facility]): Patient is not on anticoagulation it appears in reviewing previous records because of difficulty controlling INR and with history of bleeds. Patient appears to be currently rate controlled. - continue coreg   Abdominal pain: Chronic - morphine prn     Essential hypertension: stable - continue to monitor  Code Status:   full Family Communication: bedside Disposition Plan: admit   Total time spent 55 minutes.Greater than 50% of this time was spent in counseling, explanation of diagnosis, planning of further management, and coordination of care  [facility] Pager [phone]  If 7PM-7AM, please contact night-coverage [email or web address] Password [facility] 07/28/2015, 11:19 PM

## 2015-07-28 NOTE — ED Notes (Signed)
Pt here for left sided chest pain. Pt in afib hx of the same.

## 2015-07-29 ENCOUNTER — Encounter (HOSPITAL_COMMUNITY): Payer: Self-pay | Admitting: *Deleted

## 2015-07-29 DIAGNOSIS — K621 Rectal polyp: Secondary | ICD-10-CM | POA: Diagnosis present

## 2015-07-29 DIAGNOSIS — D62 Acute posthemorrhagic anemia: Secondary | ICD-10-CM | POA: Diagnosis present

## 2015-07-29 DIAGNOSIS — K921 Melena: Secondary | ICD-10-CM | POA: Diagnosis present

## 2015-07-29 DIAGNOSIS — E43 Unspecified severe protein-calorie malnutrition: Secondary | ICD-10-CM | POA: Diagnosis present

## 2015-07-29 DIAGNOSIS — F4329 Adjustment disorder with other symptoms: Secondary | ICD-10-CM | POA: Diagnosis not present

## 2015-07-29 DIAGNOSIS — K620 Anal polyp: Secondary | ICD-10-CM | POA: Diagnosis not present

## 2015-07-29 DIAGNOSIS — Z86010 Personal history of colonic polyps: Secondary | ICD-10-CM | POA: Diagnosis not present

## 2015-07-29 DIAGNOSIS — I10 Essential (primary) hypertension: Secondary | ICD-10-CM | POA: Diagnosis present

## 2015-07-29 DIAGNOSIS — E875 Hyperkalemia: Secondary | ICD-10-CM | POA: Diagnosis present

## 2015-07-29 DIAGNOSIS — R636 Underweight: Secondary | ICD-10-CM | POA: Diagnosis present

## 2015-07-29 DIAGNOSIS — N400 Enlarged prostate without lower urinary tract symptoms: Secondary | ICD-10-CM | POA: Diagnosis present

## 2015-07-29 DIAGNOSIS — F329 Major depressive disorder, single episode, unspecified: Secondary | ICD-10-CM | POA: Diagnosis present

## 2015-07-29 DIAGNOSIS — R109 Unspecified abdominal pain: Secondary | ICD-10-CM | POA: Diagnosis present

## 2015-07-29 DIAGNOSIS — K922 Gastrointestinal hemorrhage, unspecified: Secondary | ICD-10-CM | POA: Diagnosis present

## 2015-07-29 DIAGNOSIS — K5900 Constipation, unspecified: Secondary | ICD-10-CM | POA: Diagnosis present

## 2015-07-29 DIAGNOSIS — I480 Paroxysmal atrial fibrillation: Secondary | ICD-10-CM

## 2015-07-29 DIAGNOSIS — K625 Hemorrhage of anus and rectum: Secondary | ICD-10-CM | POA: Diagnosis present

## 2015-07-29 DIAGNOSIS — I442 Atrioventricular block, complete: Secondary | ICD-10-CM | POA: Diagnosis present

## 2015-07-29 DIAGNOSIS — Z9049 Acquired absence of other specified parts of digestive tract: Secondary | ICD-10-CM | POA: Diagnosis not present

## 2015-07-29 DIAGNOSIS — R1013 Epigastric pain: Secondary | ICD-10-CM | POA: Diagnosis not present

## 2015-07-29 DIAGNOSIS — C8510 Unspecified B-cell lymphoma, unspecified site: Secondary | ICD-10-CM | POA: Diagnosis present

## 2015-07-29 DIAGNOSIS — K2961 Other gastritis with bleeding: Secondary | ICD-10-CM

## 2015-07-29 DIAGNOSIS — I482 Chronic atrial fibrillation: Secondary | ICD-10-CM | POA: Diagnosis present

## 2015-07-29 DIAGNOSIS — D128 Benign neoplasm of rectum: Secondary | ICD-10-CM | POA: Insufficient documentation

## 2015-07-29 DIAGNOSIS — E785 Hyperlipidemia, unspecified: Secondary | ICD-10-CM | POA: Diagnosis present

## 2015-07-29 DIAGNOSIS — R1030 Lower abdominal pain, unspecified: Secondary | ICD-10-CM | POA: Diagnosis not present

## 2015-07-29 DIAGNOSIS — R0789 Other chest pain: Secondary | ICD-10-CM | POA: Diagnosis present

## 2015-07-29 DIAGNOSIS — Z681 Body mass index (BMI) 19 or less, adult: Secondary | ICD-10-CM | POA: Diagnosis not present

## 2015-07-29 DIAGNOSIS — Z9119 Patient's noncompliance with other medical treatment and regimen: Secondary | ICD-10-CM | POA: Diagnosis not present

## 2015-07-29 DIAGNOSIS — M109 Gout, unspecified: Secondary | ICD-10-CM | POA: Diagnosis present

## 2015-07-29 DIAGNOSIS — I481 Persistent atrial fibrillation: Secondary | ICD-10-CM | POA: Diagnosis not present

## 2015-07-29 DIAGNOSIS — J441 Chronic obstructive pulmonary disease with (acute) exacerbation: Secondary | ICD-10-CM | POA: Diagnosis present

## 2015-07-29 LAB — COMPREHENSIVE METABOLIC PANEL
ALT: 17 U/L (ref 17–63)
ANION GAP: 8 (ref 5–15)
AST: 19 U/L (ref 15–41)
Albumin: 3.2 g/dL — ABNORMAL LOW (ref 3.5–5.0)
Alkaline Phosphatase: 102 U/L (ref 38–126)
BILIRUBIN TOTAL: 0.5 mg/dL (ref 0.3–1.2)
BUN: 24 mg/dL — AB (ref 6–20)
CHLORIDE: 111 mmol/L (ref 101–111)
CO2: 20 mmol/L — ABNORMAL LOW (ref 22–32)
Calcium: 8.6 mg/dL — ABNORMAL LOW (ref 8.9–10.3)
Creatinine, Ser: 0.72 mg/dL (ref 0.61–1.24)
Glucose, Bld: 114 mg/dL — ABNORMAL HIGH (ref 65–99)
POTASSIUM: 4.4 mmol/L (ref 3.5–5.1)
Sodium: 139 mmol/L (ref 135–145)
TOTAL PROTEIN: 6.6 g/dL (ref 6.5–8.1)

## 2015-07-29 LAB — URINALYSIS, ROUTINE W REFLEX MICROSCOPIC
Bilirubin Urine: NEGATIVE
GLUCOSE, UA: NEGATIVE mg/dL
HGB URINE DIPSTICK: NEGATIVE
Ketones, ur: NEGATIVE mg/dL
LEUKOCYTES UA: NEGATIVE
Nitrite: NEGATIVE
PROTEIN: NEGATIVE mg/dL
SPECIFIC GRAVITY, URINE: 1.012 (ref 1.005–1.030)
pH: 5 (ref 5.0–8.0)

## 2015-07-29 LAB — HEMOGLOBIN AND HEMATOCRIT, BLOOD
HCT: 30.9 % — ABNORMAL LOW (ref 39.0–52.0)
HCT: 29.4 % — ABNORMAL LOW (ref 39.0–52.0)
HEMOGLOBIN: 10.1 g/dL — AB (ref 13.0–17.0)
Hemoglobin: 9.6 g/dL — ABNORMAL LOW (ref 13.0–17.0)

## 2015-07-29 LAB — TYPE AND SCREEN
ABO/RH(D): O POS
ANTIBODY SCREEN: NEGATIVE

## 2015-07-29 LAB — TROPONIN I: Troponin I: <0.03 ng/mL (ref <0.031)

## 2015-07-29 MED ORDER — FLEET ENEMA 7-19 GM/118ML RE ENEM
1.0000 | ENEMA | Freq: Once | RECTAL | Status: AC
  Administered 2015-07-30: 1 via RECTAL
  Filled 2015-07-29: qty 1

## 2015-07-29 MED ORDER — ENSURE ENLIVE PO LIQD
237.0000 mL | Freq: Two times a day (BID) | ORAL | Status: DC
  Administered 2015-07-29: 237 mL via ORAL

## 2015-07-29 MED ORDER — ALBUTEROL SULFATE (2.5 MG/3ML) 0.083% IN NEBU
2.5000 mg | INHALATION_SOLUTION | Freq: Three times a day (TID) | RESPIRATORY_TRACT | Status: DC
  Administered 2015-07-29 – 2015-07-30 (×2): 2.5 mg via RESPIRATORY_TRACT
  Filled 2015-07-29 (×3): qty 3

## 2015-07-29 MED ORDER — SODIUM CHLORIDE 0.9 % IV BOLUS (SEPSIS)
500.0000 mL | Freq: Once | INTRAVENOUS | Status: AC
  Administered 2015-07-29: 500 mL via INTRAVENOUS

## 2015-07-29 NOTE — Progress Notes (Addendum)
Chaplain visit the result of a Spiritual Consult in pt chart. [name] wished a chaplain to pray for him before he goes into surgery. He reports he has no family in the area and just needed a prayer and a short talk. Spiritual Consult will be marked completed, but chaplain should be paged at anytime Damon Rossey feels a need for further spiritual or emotional care and support.   Sallee Lange. Asti Mackley, DMin, MDiv, MA Chaplain

## 2015-07-29 NOTE — Consult Note (Signed)
Gastroenterology and Hepatology Consult Note   History [name] MRN # [id number]  Date of Admission: 07/28/2015 Date of Consultation: 07/29/2015 Referring physician: Dr. Theodis Blaze, MD  Reason for Consultation/Chief Complaint: rectal bleeding  Subjective HPI:  This is a 70 year old man known to Dr. Hilarie Fredrickson from a colonoscopy in April 2016 revealed a cecal mass, large transverse colon polyp and a large rectal polyp. The patient ultimately underwent subtotal colectomy, but had postoperative rectal bleeding. In May 2016 Dr. Hilarie Fredrickson did a sigmoidoscopy and revealed a large friable rectal polyp. 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 I find no operative reports, so it appears that did not happen. Patient states he has intermittent rectal bleeding, and on the day of admission yesterday had multiple blood bowel movements. He has had no further bleeding since admission, and he is anxious to go home soon. He is a very limited historian.  ROS:  Constitutional: States he cannot gain weight "even though I eat well" He admits to depression  All other systems are negative except as noted above in the HPI  Past Medical History Past Medical History  Diagnosis Date  . Depression   . Fall at nursing home     December 2014  . History of migraine   . Gout attack   . Complete heart block ([place])     St. Jude pacemaker - followed by Dr. Lovena Le  . Chronic atrial fibrillation ([place])   . HLD (hyperlipidemia)   . GIB (gastrointestinal bleeding)   . BPH (benign prostatic hyperplasia)   . Essential hypertension   . Gastric ulcer   . Non Hodgkin's lymphoma ([place])   . Tubular adenoma of colon     Past Surgical History Past Surgical History  Procedure Laterality Date  . Pacemaker generator change      Dr. Lovena Le  . Appendectomy    . Cholecystectomy    . Open reduction of hip Right 05/28/2013    Procedure:  OPEN REDUCTION OF HIP;  Surgeon: Renette Butters, MD;  Location: [place];  Service: Orthopedics;  Laterality: Right;  . Esophagogastroduodenoscopy (egd) with propofol N/A 09/26/2014    Procedure: ESOPHAGOGASTRODUODENOSCOPY (EGD) WITH PROPOFOL;  Surgeon: Jerene Bears, MD;  Location[facility] ENDOSCOPY;  Service: Endoscopy;  Laterality: N/A;  . Colonoscopy N/A 10/01/2014    Procedure: COLONOSCOPY;  Surgeon: Jerene Bears, MD;  Location: [facility] ENDOSCOPY;  Service: Endoscopy;  Laterality: N/A;  . Partial colectomy N/A 10/05/2014    Procedure: SUBTOTAL COLECTOMY WITH ILEORECTAL ANASTOMOSIS;  Surgeon: Donnie Mesa, MD;  Location: [place];  Service: General;  Laterality: N/A;  . Flexible sigmoidoscopy N/A 11/07/2014    Procedure: FLEXIBLE SIGMOIDOSCOPY;  Surgeon: Jerene Bears, MD;  Location: [facility] ENDOSCOPY;  Service: Endoscopy;  Laterality: N/A;  . Gastric resection      Family History Family History  Problem Relation Age of Onset  . Colon cancer Mother     Social History Social History   Social History  . Marital Status: Divorced    Spouse Name: N/A  . Number of Children: N/A  . Years of Education: N/A   Social History Main Topics  . Smoking status: Current Every Day Smoker -- 0.50 packs/day for 66 years    Types: Cigarettes  . Smokeless tobacco: Former Systems developer    Types: Snuff, Chew     Comment: 05/27/2013 "haven't used chew or snuff in ~ 30  yr"  . Alcohol Use: 0.0 oz/week    0 Standard drinks or equivalent per week     Comment: "I drink a fifth of bourbon a month"  . Drug Use: Yes    Special: Marijuana     Comment: "a joint a month"  . Sexual Activity: No   Other Topics Concern  . None   Social History Narrative    Allergies No Known Allergies  Outpatient Meds Outpatient medications reviewed  Inpatient med list reviewed  _____________________________________________________________________ Objective  Exam:  Current vital signs  Patient Vitals for the past 8 hrs:  BP Temp Temp src  Pulse Resp SpO2  07/29/15 1422 136/60 mmHg 97.3 F (36.3 C) Oral 80 20 100 %  07/29/15 1204 - - - - - 98 %    Intake/Output Summary (Last 24 hours) at 07/29/15 1553 Last data filed at 07/29/15 1200  Gross per 24 hour  Intake 2571.67 ml  Output    201 ml  Net 2370.67 ml    Physical Exam:  General: this is a chronically ill-appearing white male patient in no acute distress, appears older than stated age  Eyes: sclera anicteric, no redness  ENT: oral mucosa moist without lesions, no cervical or supraclavicular lymphadenopathy, poor dentition  CV: RRR without murmur, S1/S2, no JVD,, no peripheral edema  Resp: clear to auscultation bilaterally, normal RR and effort noted  GI: soft, no tenderness, with active bowel sounds. No guarding or palpable organomegaly noted  Skin; warm and dry, no rash or jaundice noted  Neuro: awake, alert and oriented x 3. Normal gross motor function and fluent speech.  Labs:   Recent Labs Lab 07/25/15 0045 07/26/15 1924 07/28/15 2221 07/29/15 0136 07/29/15 0744  WBC 7.6 10.0 5.2  --   --   HGB 10.5* 11.8* 10.3* 10.1* 9.6*  HCT 33.6* 38.9* 32.7* 30.9* 29.4*  PLT 169 214 186  --   --     Recent Labs Lab 07/29/15 0136  NA 139  K 4.4  CL 111  CO2 20*  BUN 24*  ALBUMIN 3.2*  ALKPHOS 102  ALT 17  AST 19  GLUCOSE 114*   No results for input(s): INR in the last 168 hours.  Radiologic studies:   @ASSESSMENTPLANBEGIN @ Impression:  Rectal bleeding Anemia of acute on chronic GI blood loss Rectal adenomatous polyp  The bleeding has now ceased and his hemoglobin is down about a gram from what it was last week.  Plan:  Sigmoidoscopy tomorrow followed by surgical evaluation. The benefits and risks of the planned procedure were described in detail with the patient or (when appropriate) their health care proxy.  Risks were outlined as including, but not limited to, bleeding, infection, perforation, adverse medication reaction leading  to cardiac or pulmonary decompensation, or pancreatitis (if ERCP).  The limitation of incomplete mucosal visualization was also discussed.  No guarantees or warranties were given.     Thank you for the courtesy of this consult.  Please contact me with any questions or concerns.  Nelida Meuse III Pager: [phone] Mon-Fri 8a-5p [phone] after 5p, weekends, holidays

## 2015-07-29 NOTE — Progress Notes (Signed)
Patient ID: [name], male   DOB: [birth year], 70 y.o.   MRN: [id number]  [facility] PROGRESS NOTE  Wardie Wion Farruggia [id number] DOB: 12/23/1945 DOA: 07/28/2015 PCP: [name], MD   Brief narrative:    70 y.o. 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   In emergency department, patient's vital signs were within normal limits. Blood work notable for hemoglobin drop from 11.8 to 10.3 in just two days ,Cr 0.83, BUN 30, platelets 186K, otherwise unremarkable. FOBT +. Chest x-ray consistent with COPD but no active disease.   Assessment/Plan:    Principal Problem:   GIB (gastrointestinal bleeding) - per flex sig in 10/2014, Ileorectal anastomosis with very superficial erosion, rectal mass with friable surfaces, previously biopsied found to be tubular adenoma with at least high-grade dysplasia - pt denies further episodes, wants to eat - GI team consulted - continue Protonix PPI BID  - CBC in AM  Active Problems:   Atrial fibrillation (HCC) - CHADS 2 score 4, no AC due to high risk bleed - rate controlled for now     Underweight  - Body mass index is 15.4 kg/(m^2).  DVT prophylaxis - SCD's  Code Status: Full.  Family Communication:  plan of care discussed with the patient Disposition Plan: Home by 2/20   IV access:  Peripheral IV  Procedures and diagnostic studies:     Ct Abdomen Pelvis W Contrast 07/16/2015  Significant stool within the distal colonic loops consistent with impaction. No definite mass is identified as a cause for obstruction. 2. Marked atherosclerosis of the  abdominal aorta and iliac arteries. 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 Clinical correlation is recommended regarding possible right lower extremity ischemic symptoms. 3. Status post partial colectomy. 4. Cardiomegaly.  Coronary stent. 5. Status post cholecystectomy. Stable appearance of mildly dilated intra and extrahepatic biliary ducts. 6. Stable right adrenal lesion, consistent with a benign adenoma given the long-term stability. 7. Bilateral renal cysts. 8. Partial small bowel malrotation.   Dg Chest Port 1 View 07/29/2015 No active disease.   Medical Consultants:  GI  Other Consultants:  None   IAnti-Infectives:   None   Faye Ramsay, MD  [facility] Pager [phone]  If 7PM-7AM, please contact night-coverage [email or web address] Password [facility] 07/29/2015, 3:15 PM  HPI/Subjective: No events overnight. Denies bloody BM's, wants to eat.  Objective: Filed Vitals:   07/29/15 0106 07/29/15 0537 07/29/15 1204 07/29/15 1422  BP: 121/77 98/56  136/60  Pulse: 80   80  Temp: 98.3 F (36.8 C) 98.2 F (36.8 C)  97.3 F (36.3 C)  TempSrc: Oral Oral  Oral  Resp: 18   20  Height: 5\' 9"  (1.753 m)     Weight: 47.31 kg (104 lb 4.8 oz)     SpO2: 97% 99% 98% 100%    Intake/Output Summary (Last 24 hours) at 07/29/15 1515 Last data filed at 07/29/15 1200  Gross per 24 hour  Intake 2571.67 ml  Output    201 ml  Net 2370.67 ml    Exam:   General:  Pt is alert, follows commands appropriately, not in acute distress  Cardiovascular:  Regular rate and rhythm, no rubs, no gallops  Respiratory: Clear to auscultation bilaterally, no wheezing, no crackles, no rhonchi  Abdomen: Soft, non tender, non distended, bowel sounds present, no guarding  Extremities: No edema, pulses DP and PT palpable bilaterally  Data Reviewed: Basic Metabolic Panel:  Recent Labs Lab 07/24/15 0719 07/25/15 0045 07/26/15 1924 07/28/15 2222 07/29/15 0136  NA 139 136 139 140 139  K 4.5  3.7 5.1 4.4 4.4  CL 102 103 104 108 111  CO2 21* 22 23 22  20*  GLUCOSE 100* 113* 182* 125* 114*  BUN 31* 20 19 30* 24*  CREATININE 0.71 0.69 0.80 0.83 0.72  CALCIUM 8.9 8.9 9.1 9.1 8.6*   Liver Function Tests:  Recent Labs Lab 07/29/15 0136  AST 19  ALT 17  ALKPHOS 102  BILITOT 0.5  PROT 6.6  ALBUMIN 3.2*   CBC:  Recent Labs Lab 07/24/15 0719 07/25/15 0045 07/26/15 1924 07/28/15 2221 07/29/15 0136 07/29/15 0744  WBC 9.3 7.6 10.0 5.2  --   --   HGB 11.4* 10.5* 11.8* 10.3* 10.1* 9.6*  HCT 36.2* 33.6* 38.9* 32.7* 30.9* 29.4*  MCV 93.8 91.6 94.9 93.4  --   --   PLT 167 169 214 186  --   --    Cardiac Enzymes:  Recent Labs Lab 07/25/15 0045 07/28/15 2341  TROPONINI <0.03 <0.03   Scheduled Meds: . carvedilol  6.25 mg Oral BID WC  . feeding supplement (ENSURE ENLIVE)  237 mL Oral BID BM  . pantoprazole (PROTONIX) IV  40 mg Intravenous Q12H  . sodium chloride flush  3 mL Intravenous Q12H   Continuous Infusions: . sodium chloride 100 mL/hr at 07/29/15 0715

## 2015-07-30 ENCOUNTER — Inpatient Hospital Stay (HOSPITAL_COMMUNITY)
Admission: EM | Admit: 2015-07-30 | Discharge: 2015-08-07 | Disposition: A | Discharge status: Skilled Nursing Facility | Payer: Medicare Other | Source: Home / Self Care | Attending: Internal Medicine | Admitting: Internal Medicine

## 2015-07-30 ENCOUNTER — Encounter (HOSPITAL_COMMUNITY)
Admission: EM | Discharge status: Against Medical Advice | Payer: Self-pay | Source: Home / Self Care | Attending: Internal Medicine

## 2015-07-30 ENCOUNTER — Encounter (HOSPITAL_COMMUNITY): Payer: Self-pay | Admitting: *Deleted

## 2015-07-30 ENCOUNTER — Emergency Department (HOSPITAL_COMMUNITY): Payer: Medicare Other

## 2015-07-30 ENCOUNTER — Encounter (HOSPITAL_COMMUNITY): Payer: Self-pay | Admitting: Emergency Medicine

## 2015-07-30 DIAGNOSIS — K922 Gastrointestinal hemorrhage, unspecified: Secondary | ICD-10-CM | POA: Diagnosis present

## 2015-07-30 DIAGNOSIS — Z9049 Acquired absence of other specified parts of digestive tract: Secondary | ICD-10-CM

## 2015-07-30 DIAGNOSIS — Z681 Body mass index (BMI) 19 or less, adult: Secondary | ICD-10-CM

## 2015-07-30 DIAGNOSIS — Z5900 Homelessness unspecified: Secondary | ICD-10-CM

## 2015-07-30 DIAGNOSIS — Z7952 Long term (current) use of systemic steroids: Secondary | ICD-10-CM

## 2015-07-30 DIAGNOSIS — R0789 Other chest pain: Secondary | ICD-10-CM

## 2015-07-30 DIAGNOSIS — R079 Chest pain, unspecified: Secondary | ICD-10-CM | POA: Diagnosis present

## 2015-07-30 DIAGNOSIS — R0602 Shortness of breath: Secondary | ICD-10-CM

## 2015-07-30 DIAGNOSIS — Z79899 Other long term (current) drug therapy: Secondary | ICD-10-CM

## 2015-07-30 DIAGNOSIS — I4891 Unspecified atrial fibrillation: Secondary | ICD-10-CM | POA: Diagnosis present

## 2015-07-30 DIAGNOSIS — I480 Paroxysmal atrial fibrillation: Secondary | ICD-10-CM

## 2015-07-30 DIAGNOSIS — K921 Melena: Secondary | ICD-10-CM | POA: Diagnosis present

## 2015-07-30 DIAGNOSIS — K625 Hemorrhage of anus and rectum: Secondary | ICD-10-CM

## 2015-07-30 DIAGNOSIS — I442 Atrioventricular block, complete: Secondary | ICD-10-CM | POA: Diagnosis present

## 2015-07-30 DIAGNOSIS — D128 Benign neoplasm of rectum: Secondary | ICD-10-CM | POA: Diagnosis present

## 2015-07-30 DIAGNOSIS — C8510 Unspecified B-cell lymphoma, unspecified site: Secondary | ICD-10-CM | POA: Diagnosis present

## 2015-07-30 DIAGNOSIS — D62 Acute posthemorrhagic anemia: Secondary | ICD-10-CM | POA: Diagnosis present

## 2015-07-30 DIAGNOSIS — I482 Chronic atrial fibrillation: Secondary | ICD-10-CM

## 2015-07-30 DIAGNOSIS — Z950 Presence of cardiac pacemaker: Secondary | ICD-10-CM

## 2015-07-30 DIAGNOSIS — E875 Hyperkalemia: Secondary | ICD-10-CM

## 2015-07-30 DIAGNOSIS — Z7982 Long term (current) use of aspirin: Secondary | ICD-10-CM

## 2015-07-30 DIAGNOSIS — K621 Rectal polyp: Principal | ICD-10-CM | POA: Diagnosis present

## 2015-07-30 DIAGNOSIS — I10 Essential (primary) hypertension: Secondary | ICD-10-CM | POA: Diagnosis present

## 2015-07-30 DIAGNOSIS — M109 Gout, unspecified: Secondary | ICD-10-CM | POA: Diagnosis present

## 2015-07-30 DIAGNOSIS — Z9119 Patient's noncompliance with other medical treatment and regimen: Secondary | ICD-10-CM

## 2015-07-30 DIAGNOSIS — K620 Anal polyp: Secondary | ICD-10-CM

## 2015-07-30 DIAGNOSIS — K9189 Other postprocedural complications and disorders of digestive system: Secondary | ICD-10-CM

## 2015-07-30 DIAGNOSIS — Z800 Family history of malignant neoplasm of digestive organs: Secondary | ICD-10-CM

## 2015-07-30 DIAGNOSIS — K5900 Constipation, unspecified: Secondary | ICD-10-CM

## 2015-07-30 DIAGNOSIS — R109 Unspecified abdominal pain: Secondary | ICD-10-CM | POA: Diagnosis present

## 2015-07-30 DIAGNOSIS — Z8711 Personal history of peptic ulcer disease: Secondary | ICD-10-CM

## 2015-07-30 DIAGNOSIS — N400 Enlarged prostate without lower urinary tract symptoms: Secondary | ICD-10-CM | POA: Diagnosis present

## 2015-07-30 DIAGNOSIS — E785 Hyperlipidemia, unspecified: Secondary | ICD-10-CM | POA: Diagnosis present

## 2015-07-30 DIAGNOSIS — J441 Chronic obstructive pulmonary disease with (acute) exacerbation: Secondary | ICD-10-CM | POA: Diagnosis present

## 2015-07-30 DIAGNOSIS — E43 Unspecified severe protein-calorie malnutrition: Secondary | ICD-10-CM | POA: Diagnosis present

## 2015-07-30 DIAGNOSIS — Z8572 Personal history of non-Hodgkin lymphomas: Secondary | ICD-10-CM

## 2015-07-30 DIAGNOSIS — R636 Underweight: Secondary | ICD-10-CM | POA: Diagnosis present

## 2015-07-30 DIAGNOSIS — K567 Ileus, unspecified: Secondary | ICD-10-CM

## 2015-07-30 DIAGNOSIS — Z8719 Personal history of other diseases of the digestive system: Secondary | ICD-10-CM

## 2015-07-30 DIAGNOSIS — C884 Extranodal marginal zone B-cell lymphoma of mucosa-associated lymphoid tissue [MALT-lymphoma]: Secondary | ICD-10-CM | POA: Diagnosis present

## 2015-07-30 DIAGNOSIS — F17210 Nicotine dependence, cigarettes, uncomplicated: Secondary | ICD-10-CM | POA: Diagnosis present

## 2015-07-30 DIAGNOSIS — Z86010 Personal history of colonic polyps: Secondary | ICD-10-CM

## 2015-07-30 DIAGNOSIS — F329 Major depressive disorder, single episode, unspecified: Secondary | ICD-10-CM | POA: Diagnosis present

## 2015-07-30 HISTORY — DX: Atherosclerosis of aorta: I70.0

## 2015-07-30 HISTORY — DX: Other forms of angina pectoris: I20.8

## 2015-07-30 HISTORY — DX: Displaced fracture of base of neck of unspecified femur, initial encounter for closed fracture: S72.043A

## 2015-07-30 HISTORY — DX: Pneumonitis due to inhalation of food and vomit: J69.0

## 2015-07-30 HISTORY — PX: FLEXIBLE SIGMOIDOSCOPY: SHX5431

## 2015-07-30 HISTORY — DX: Other cardiomyopathies: I42.8

## 2015-07-30 HISTORY — DX: Atrioventricular block, complete: I44.2

## 2015-07-30 LAB — BASIC METABOLIC PANEL
Anion gap: 5 (ref 5–15)
BUN: 12 mg/dL (ref 6–20)
CHLORIDE: 109 mmol/L (ref 101–111)
CO2: 21 mmol/L — ABNORMAL LOW (ref 22–32)
CREATININE: 0.68 mg/dL (ref 0.61–1.24)
Calcium: 8.4 mg/dL — ABNORMAL LOW (ref 8.9–10.3)
GFR calc Af Amer: >60 mL/min (ref >60)
GFR calc non Af Amer: >60 mL/min (ref >60)
GLUCOSE: 107 mg/dL — AB (ref 65–99)
Potassium: 4.4 mmol/L (ref 3.5–5.1)
SODIUM: 135 mmol/L (ref 135–145)

## 2015-07-30 LAB — COMPREHENSIVE METABOLIC PANEL
ALT: 17 U/L (ref 17–63)
ANION GAP: 7 (ref 5–15)
AST: 19 U/L (ref 15–41)
Albumin: 3.7 g/dL (ref 3.5–5.0)
Alkaline Phosphatase: 118 U/L (ref 38–126)
BILIRUBIN TOTAL: 0.4 mg/dL (ref 0.3–1.2)
BUN: 20 mg/dL (ref 6–20)
CO2: 25 mmol/L (ref 22–32)
Calcium: 9.2 mg/dL (ref 8.9–10.3)
Chloride: 108 mmol/L (ref 101–111)
Creatinine, Ser: 1.13 mg/dL (ref 0.61–1.24)
Glucose, Bld: 99 mg/dL (ref 65–99)
POTASSIUM: 5.2 mmol/L — AB (ref 3.5–5.1)
Sodium: 140 mmol/L (ref 135–145)
TOTAL PROTEIN: 7.5 g/dL (ref 6.5–8.1)

## 2015-07-30 LAB — CBC
HCT: 31.9 % — ABNORMAL LOW (ref 39.0–52.0)
HEMATOCRIT: 35.7 % — AB (ref 39.0–52.0)
HEMOGLOBIN: 10 g/dL — AB (ref 13.0–17.0)
Hemoglobin: 11.1 g/dL — ABNORMAL LOW (ref 13.0–17.0)
MCH: 29.6 pg (ref 26.0–34.0)
MCH: 29.4 pg (ref 26.0–34.0)
MCHC: 31.3 g/dL (ref 30.0–36.0)
MCHC: 31.1 g/dL (ref 30.0–36.0)
MCV: 94.4 fL (ref 78.0–100.0)
MCV: 94.7 fL (ref 78.0–100.0)
PLATELETS: 206 10*3/uL (ref 150–400)
Platelets: 165 10*3/uL (ref 150–400)
RBC: 3.38 MIL/uL — ABNORMAL LOW (ref 4.22–5.81)
RBC: 3.77 MIL/uL — AB (ref 4.22–5.81)
RDW: 15.2 % (ref 11.5–15.5)
RDW: 15.1 % (ref 11.5–15.5)
WBC: 5.8 10*3/uL (ref 4.0–10.5)
WBC: 7.8 10*3/uL (ref 4.0–10.5)

## 2015-07-30 LAB — TYPE AND SCREEN
ABO/RH(D): O POS
ANTIBODY SCREEN: NEGATIVE

## 2015-07-30 SURGERY — SIGMOIDOSCOPY, FLEXIBLE
Anesthesia: Moderate Sedation

## 2015-07-30 MED ORDER — FENTANYL CITRATE (PF) 100 MCG/2ML IJ SOLN
INTRAMUSCULAR | Status: AC
  Filled 2015-07-30: qty 2

## 2015-07-30 MED ORDER — ALBUTEROL SULFATE (2.5 MG/3ML) 0.083% IN NEBU: 2.5000 mg | INHALATION_SOLUTION | RESPIRATORY_TRACT | Status: DC | PRN

## 2015-07-30 MED ORDER — CARVEDILOL 6.25 MG PO TABS
6.2500 mg | ORAL_TABLET | Freq: Two times a day (BID) | ORAL | Status: DC
  Administered 2015-07-31 – 2015-08-07 (×15): 6.25 mg via ORAL
  Filled 2015-07-30 (×19): qty 1

## 2015-07-30 MED ORDER — ONDANSETRON HCL 4 MG/2ML IJ SOLN
4.0000 mg | Freq: Four times a day (QID) | INTRAMUSCULAR | Status: DC | PRN
  Administered 2015-07-31 – 2015-08-03 (×2): 4 mg via INTRAVENOUS
  Filled 2015-07-30: qty 2

## 2015-07-30 MED ORDER — SODIUM CHLORIDE 0.9 % IV SOLN
INTRAVENOUS | Status: DC
  Administered 2015-07-30 – 2015-08-03 (×6): via INTRAVENOUS

## 2015-07-30 MED ORDER — ACETAMINOPHEN 325 MG PO TABS
650.0000 mg | ORAL_TABLET | Freq: Four times a day (QID) | ORAL | Status: DC | PRN
  Administered 2015-08-03 – 2015-08-04 (×2): 650 mg via ORAL
  Filled 2015-07-30 (×2): qty 2

## 2015-07-30 MED ORDER — ACETAMINOPHEN 650 MG RE SUPP
650.0000 mg | Freq: Four times a day (QID) | RECTAL | Status: DC | PRN
  Administered 2015-08-04: 650 mg via RECTAL
  Filled 2015-07-30: qty 1

## 2015-07-30 MED ORDER — ONDANSETRON HCL 4 MG PO TABS
4.0000 mg | ORAL_TABLET | Freq: Four times a day (QID) | ORAL | Status: DC | PRN
Start: 2015-07-30 — End: 2015-08-07

## 2015-07-30 MED ORDER — MIDAZOLAM HCL 10 MG/2ML IJ SOLN
INTRAMUSCULAR | Status: DC | PRN
  Administered 2015-07-30: 1 mg via INTRAVENOUS

## 2015-07-30 MED ORDER — NICOTINE 21 MG/24HR TD PT24
21.0000 mg | MEDICATED_PATCH | Freq: Every day | TRANSDERMAL | Status: DC
  Administered 2015-07-31 – 2015-08-07 (×8): 21 mg via TRANSDERMAL
  Filled 2015-07-30 (×9): qty 1

## 2015-07-30 MED ORDER — MIDAZOLAM HCL 5 MG/ML IJ SOLN
INTRAMUSCULAR | Status: AC
  Filled 2015-07-30: qty 2

## 2015-07-30 MED ORDER — SODIUM CHLORIDE 0.9% FLUSH
3.0000 mL | Freq: Two times a day (BID) | INTRAVENOUS | Status: DC
  Administered 2015-07-31 – 2015-08-03 (×6): 3 mL via INTRAVENOUS

## 2015-07-30 MED ORDER — PREDNISONE 50 MG PO TABS
60.0000 mg | ORAL_TABLET | Freq: Every day | ORAL | Status: DC
  Administered 2015-07-31 – 2015-08-02 (×3): 60 mg via ORAL
  Filled 2015-07-30 (×5): qty 1

## 2015-07-30 MED ORDER — MORPHINE SULFATE (PF) 2 MG/ML IV SOLN
1.0000 mg | INTRAVENOUS | Status: DC | PRN
  Administered 2015-07-31 – 2015-08-07 (×39): 1 mg via INTRAVENOUS
  Filled 2015-07-30 (×40): qty 1

## 2015-07-30 NOTE — Interval H&P Note (Signed)
History and Physical Interval Note:  07/30/2015 9:31 AM  [name]  has presented today for surgery, with the diagnosis of rectal bleeding  The various methods of treatment have been discussed with the patient and family. After consideration of risks, benefits and other options for treatment, the patient has consented to  Procedure(s): FLEXIBLE SIGMOIDOSCOPY (N/A) as a surgical intervention .  The patient's history has been reviewed, patient examined, no change in status, stable for surgery.  I have reviewed the patient's chart and labs.  Questions were answered to the patient's satisfaction.     Nelida Meuse III

## 2015-07-30 NOTE — H&P (Addendum)
[facility] History and Physical  [name] [id number] DOB: [birth year] DOA: 07/30/2015  Referring physician: ED PCP: [name], MD   Chief Complaint:  Bleeding, abdominal pain, and shortness of breath HPI:  [name] is a 70 y.o. 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 Patient was admitted initially on 07/28/2015 for his symptoms of rectal bleeding.  Earlier today the patient underwent a flexible sigmoidoscopy and was found to have a oblong sessile polyp approximately 15-20 millimeters with Dr. Loletha Carrow of gastroenterology . However, patient left AMA citing wanting to go out for a smokeand therefore signed papers to be discharged . 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 Patient notes that he still having abdominal pain as achy in nature and rectal bleeding have not been resolved. He notes that he needs something in order to help him sleep as he has not slept in several days and has pain it's not being adequately treated. He notes that nasal cannula oxygen and breathing treatments helps relieve his symptoms of shortness of breath.   Review of Systems  Constitutional: Positive for malaise/fatigue. Negative for fever, chills and diaphoresis.  HENT: Negative for ear pain and tinnitus.   Respiratory: Positive for cough, shortness of breath and wheezing.   Cardiovascular: Positive for chest pain. Negative for leg swelling.  Gastrointestinal: Positive for abdominal pain and blood in stool.  Genitourinary: Negative for urgency and frequency.   Musculoskeletal: Positive for joint pain. Negative for falls.  Skin: Negative for itching and rash.  Neurological: Positive for weakness. Negative for focal weakness and seizures.  Psychiatric/Behavioral: Negative for suicidal ideas. The patient has insomnia.      Past Medical History  Diagnosis Date  . Depression   . Fall at nursing home     December 2014  . History of migraine   . Gout attack   . Complete heart block ([place])     [employer]. Jude pacemaker - followed by Dr. Lovena Le  . Chronic atrial fibrillation ([place])   . HLD (hyperlipidemia)   . GIB (gastrointestinal bleeding)   . BPH (benign prostatic hyperplasia)   . Essential hypertension   . Gastric ulcer   . Non Hodgkin's lymphoma ([place])   . Tubular adenoma of colon      Past Surgical History  Procedure Laterality Date  . Pacemaker generator change      Dr. Lovena Le  . Appendectomy    . Cholecystectomy    . Open reduction of hip Right 05/28/2013    Procedure: OPEN REDUCTION OF HIP;  Surgeon: Renette Butters, MD;  Location: [facility];  Service: Orthopedics;  Laterality: Right;  . Esophagogastroduodenoscopy (egd) with propofol N/A 09/26/2014    Procedure: ESOPHAGOGASTRODUODENOSCOPY (EGD) WITH PROPOFOL;  Surgeon: Jerene Bears, MD;  Location: [facility] ENDOSCOPY;  Service: Endoscopy;  Laterality: N/A;  . Colonoscopy N/A 10/01/2014    Procedure: COLONOSCOPY;  Surgeon: Jerene Bears, MD;  Location: [facility] ENDOSCOPY;  Service: Endoscopy;  Laterality: N/A;  . Partial colectomy N/A 10/05/2014    Procedure: SUBTOTAL COLECTOMY WITH ILEORECTAL ANASTOMOSIS;  Surgeon: Donnie Mesa, MD;  Location: [place];  Service:  General;  Laterality: N/A;  . Flexible sigmoidoscopy N/A 11/07/2014    Procedure: FLEXIBLE SIGMOIDOSCOPY;  Surgeon: Jerene Bears, MD;  Location: [facility] ENDOSCOPY;  Service: Endoscopy;  Laterality: N/A;  . Gastric resection        Social History:  reports that he has been smoking Cigarettes.  He has a 33 pack-year smoking history. He has quit using  smokeless tobacco. His smokeless tobacco use included Snuff and Chew. He reports that he drinks alcohol. He reports that he uses illicit drugs (Marijuana).  No Known Allergies  Family History  Problem Relation Age of Onset  . Colon cancer Mother         Prior to Admission medications   Medication Sig Start Date End Date Taking? Authorizing Provider  albuterol (PROVENTIL HFA;VENTOLIN HFA) 108 (90 Base) MCG/ACT inhaler Inhale 2 puffs into the lungs every 4 (four) hours as needed for wheezing or shortness of breath. [id number]   [name], MD  carvedilol (COREG) 6.25 MG tablet Take 1 tablet (6.25 mg total) by mouth 2 (two) times daily with a meal. 11/16/14   Velvet Bathe, MD  polyethylene glycol (MIRALAX / GLYCOLAX) packet Take 17 g by mouth daily. 07/16/15   Courteney Hemet, MD  predniSONE (DELTASONE) 20 MG tablet Take 3 tablets (60 mg total) by mouth daily. 07/13/15   Everlene Balls, MD  sodium phosphate Pediatric (FLEET) 3.5-9.5 GM/59ML enema Place 66 mLs (1 enema total) rectally once. 07/16/15   Courteney Lyn Mackuen, MD  traMADol (ULTRAM) 50 MG tablet Take 1 tablet (50 mg total) by mouth every 6 (six) hours as needed. Patient taking differently: Take 50 mg by mouth every 6 (six) hours as needed for moderate pain.  07/14/15   Etta Quill, NP     Physical Exam: Filed Vitals:   07/30/15 1731  BP: 134/79  Pulse: 98  Temp: 98.6 F (37 C)  TempSrc: Oral  Resp: 18  SpO2: 98%    Constitutional: Vital signs reviewed. Patient is a thin dishevel male who appears to be in no acute distress. Alert and oriented x3. Patient has urinated on his bed and there is a cigarette underneath his legs. Head: Normocephalic and atraumatic  Ear: TM normal bilaterally  Mouth: no erythema or exudates, MMM  Eyes: PERRL, EOMI, conjunctivae normal, No scleral icterus.  Neck: Supple, Trachea midline normal ROM, No JVD, mass, thyromegaly, or carotid bruit present.  Cardiovascular: Irregular irregular   Pulmonary/Chest: Mild expiatory wheezes Abdominal: Soft. Hyperactive bowel sounds, mild tenderness to palpation lower abdomen. No guarding or rebound. GU: no CVA tenderness Musculoskeletal: No joint deformities, erythema, or stiffness, ROM full and no nontender Ext: no edema and no cyanosis, pulses palpable bilaterally (DP and PT)  Hematology: no cervical, inginal, or axillary adenopathy.  Neurological: A&O x3, Strenght is normal and symmetric bilaterally, cranial nerve II-XII are grossly intact, no focal motor deficit, sensory intact to light touch bilaterally.  Skin: Warm, dry and intact. No rash, cyanosis, or clubbing.  Psychiatric: ? delusions     Data Review   Micro Results No results found for this or any previous visit (from the past 240 hour(s)).  Radiology Reports Dg Chest 2 View  07/30/2015  CLINICAL DATA:  SOB, cough. H/o complete heart block, chronic a-fib, non-Hodgkin's lymphoma. Pt c/o bleeding from the rectum. Smoker x 66 years. EXAM: CHEST  2 VIEW COMPARISON:  07/28/2015 FINDINGS: Lungs are mildly hyperinflated. Heart size is normal. Right-sided transvenous pacemaker leads overlie the right atrium and right ventricle. There  is minimal density at the left lung base cava most consistent with atelectasis. There are no focal consolidations. No pleural effusions or pulmonary edema. IMPRESSION: 1. Hyperinflation. 2. Minimal left lower lobe atelectasis. Electronically Signed   By: Nolon Nations M.D.   On: 07/30/2015 18:19   Dg Chest 2 View  07/24/2015  CLINICAL DATA:  Chest pain intermittent for several days. EXAM: CHEST  2 VIEW COMPARISON:  07/18/2015 FINDINGS: Atherosclerotic aortic arch. Dual lead pacer, lead positioning unchanged. Large lung volumes. Tapering of the peripheral pulmonary vasculature favors emphysema. Upper normal heart size. No pleural effusion or edema. Mild thoracic spondylosis. IMPRESSION: 1. Emphysema. 2. Atherosclerotic aortic arch. 3. Upper normal  heart size. Electronically Signed   By: Van Clines M.D.   On: 07/24/2015 07:59   Dg Chest 2 View  07/18/2015  CLINICAL DATA:  Left chest pain for 1 day. EXAM: CHEST  2 VIEW COMPARISON:  07/15/2015. FINDINGS: Normal sized heart. Clear lungs. Stable right subclavian pacemaker leads. Mild thoracic spine degenerative changes. IMPRESSION: No acute abnormality. Electronically Signed   By: Claudie Revering M.D.   On: 07/18/2015 20:35   Dg Chest 2 View  07/15/2015  CLINICAL DATA:  Chest pain and shortness of breath.  No radiation. EXAM: CHEST  2 VIEW COMPARISON:  07/12/2015 FINDINGS: The lungs are hyperinflated likely secondary to COPD. There is no focal parenchymal opacity. There is no pleural effusion or pneumothorax. The heart and mediastinal contours are unremarkable. There is a 2 lead cardiac pacemaker. The osseous structures are unremarkable. IMPRESSION: No active cardiopulmonary disease. Electronically Signed   By: Kathreen Devoid   On: 07/15/2015 16:07   Dg Chest 2 View  07/12/2015  CLINICAL DATA:  Left-sided non radiating chest pain worsening today. EXAM: CHEST  2 VIEW COMPARISON:  04/13/2015 FINDINGS: Dual-chamber pacer from the right with leads in unchanged position. Normal heart size and mediastinal contours. There is no edema, consolidation, effusion, or pneumothorax. No osseous findings to explain acute chest pain IMPRESSION: No acute finding Electronically Signed   By: Monte Fantasia M.D.   On: 07/12/2015 23:39   Ct Abdomen Pelvis W Contrast  07/16/2015  CLINICAL DATA:  Pt states he started having bloody stools last night. Hx of same. Pt was here yesterday for chest pain No oral per ER MD 177ml omni 300^129mL OMNIPAQUE IOHEXOL 300 MG/ML SOLN EXAM: CT ABDOMEN AND PELVIS WITH CONTRAST TECHNIQUE: Multidetector CT imaging of the abdomen and pelvis was performed using the standard protocol following bolus administration of intravenous contrast. CONTRAST:  135mL OMNIPAQUE IOHEXOL 300 MG/ML  SOLN  COMPARISON:  03/02/2015 and prior study 10/27/2006 FINDINGS: Lower chest: Heart is mildly enlarged. Transvenous pacemaker lead to the right ventricle. Coronary stent is visible. Upper abdomen: No focal abnormality identified within the liver, spleen, pancreas. The gallbladder is surgically absent. There is mild and stable dilatation of the intra and extrahepatic biliary ducts. Stable right adrenal lesion is 0.8 x 1.6 cm and is low in attenuation. The left adrenal gland has a normal appearance. There are bilateral renal cysts. No hydronephrosis. Gastrointestinal tract: The stomach is less distended than on the previous exam. Small bowel loops descends into the right mid abdomen consistent with small bowel malrotation. Patient has undergone partial colectomy. The distal colonic loops, sigmoid, and rectum are distended with of large amount of stool. Pelvis: Urinary bladder has a normal appearance. Prostate gland has a normal appearance. No free pelvic fluid. Retroperitoneum: There is marked atherosclerotic calcification of the abdominal aorta. Suspect at  least partial occlusion of the right common iliac artery at the bifurcation. This is later reconstituted. There is a vascular contrast within the superior and inferior mesenteric arteries as well as the celiac axis. No retroperitoneal or mesenteric adenopathy. Abdominal wall: Unremarkable. Osseous structures: Previous right hip ORIF. Mild degenerative changes are seen in the lumbar spine. No suspicious lytic or blastic lesions are identified. IMPRESSION: 1. Significant stool within the distal colonic loops consistent with impaction. No definite mass is identified as a cause for obstruction. 2. Marked atherosclerosis of the abdominal aorta and iliac arteries. 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 Clinical correlation is recommended regarding possible right lower extremity ischemic symptoms. 3. Status post partial colectomy. 4. Cardiomegaly.   Coronary stent. 5. Status post cholecystectomy. Stable appearance of mildly dilated intra and extrahepatic biliary ducts. 6. Stable right adrenal lesion, consistent with a benign adenoma given the long-term stability. 7. Bilateral renal cysts. 8. Partial small bowel malrotation. Electronically Signed   By: Nolon Nations M.D.   On: 07/16/2015 19:10   Dg Chest Port 1 View  07/29/2015  CLINICAL DATA:  GI bleed. EXAM: PORTABLE CHEST 1 VIEW COMPARISON:  07/24/2015 chest radiograph. FINDINGS: Right subclavian 2 lead pacemaker is stable in configuration with lead tips overlying the right atrium and right ventricle. Stable cardiomediastinal silhouette with top-normal heart size. No pneumothorax. No pleural effusion. Lungs appear clear, with no acute consolidative airspace disease and no pulmonary edema. IMPRESSION: No active disease. Electronically Signed   By: Ilona Sorrel M.D.   On: 07/29/2015 00:03     CBC  Recent Labs Lab 07/25/15 0045 07/26/15 1924 07/28/15 2221 07/29/15 0136 07/29/15 0744 07/30/15 0618 07/30/15 1827  WBC 7.6 10.0 5.2  --   --  5.8 7.8  HGB 10.5* 11.8* 10.3* 10.1* 9.6* 10.0* 11.1*  HCT 33.6* 38.9* 32.7* 30.9* 29.4* 31.9* 35.7*  PLT 169 214 186  --   --  165 206  MCV 91.6 94.9 93.4  --   --  94.4 94.7  MCH 28.6 28.8 29.4  --   --  29.6 29.4  MCHC 31.3 30.3 31.5  --   --  31.3 31.1  RDW 14.8 14.9 15.2  --   --  15.2 15.1    Chemistries   Recent Labs Lab 07/26/15 1924 07/28/15 2222 07/29/15 0136 07/30/15 0618 07/30/15 1827  NA 139 140 139 135 140  K 5.1 4.4 4.4 4.4 5.2*  CL 104 108 111 109 108  CO2 23 22 20* 21* 25  GLUCOSE 182* 125* 114* 107* 99  BUN 19 30* 24* 12 20  CREATININE 0.80 0.83 0.72 0.68 1.13  CALCIUM 9.1 9.1 8.6* 8.4* 9.2  AST  --   --  19  --  19  ALT  --   --  17  --  17  ALKPHOS  --   --  102  --  118  BILITOT  --   --  0.5  --  0.4    ------------------------------------------------------------------------------------------------------------------ estimated creatinine clearance is 41.9 mL/min (by C-G formula based on Cr of 1.13). ------------------------------------------------------------------------------------------------------------------ No results for input(s): HGBA1C in the last 72 hours. ------------------------------------------------------------------------------------------------------------------ No results for input(s): CHOL, HDL, LDLCALC, TRIG, CHOLHDL, LDLDIRECT in the last 72 hours. ------------------------------------------------------------------------------------------------------------------ No results for input(s): TSH, T4TOTAL, T3FREE, THYROIDAB in the last 72 hours.  Invalid input(s): FREET3 ------------------------------------------------------------------------------------------------------------------ No results for input(s): VITAMINB12, FOLATE, FERRITIN, TIBC, IRON, RETICCTPCT in the last 72 hours.  Coagulation profile No results for input(s):  INR, PROTIME in the last 168 hours.  No results for input(s): DDIMER in the last 72 hours.  Cardiac Enzymes  Recent Labs Lab 07/25/15 0045 07/28/15 2341  TROPONINI <0.03 <0.03   ------------------------------------------------------------------------------------------------------------------ Invalid input(s): POCBNP   CBG: No results for input(s): GLUCAP in the last 168 hours.     EKG: Independently reviewed.  Atrial fibrillation rate of 103   Assessment/Plan  Rectal bleeding secondary to sessile polyp: Acute on chronic. Pt   Was just admitted for similar complaints yesterday, but left AMA. Patient had a flexible sigmoidoscopy with  gastroenterology which revealed a 15-20 mm oblong sessile polyp previously noted to be displastic. - Admit to a telemetry bed - Patient was already Type and screen for blood products on last admission  -  NPO - IV fluids of normal saline at 100 ml/ hour - Protonix 40 mg IV twice a day - f/u  GI recommendations  Acute blood loss anemia:  Hemoglobin is improved to 11.9 on admission. - check CBC in a.m. - Will transfuse patient PRBC if patient becomes symptomatic or hemoglobin drops below 7.  Atrial fibrillation (HCC):Chronic. Initial EKG shows atrial fibrillation with a rate of 103. Patient is not on anticoagulation it appears in reviewing previous records because of difficulty controlling INR and with history of bleeds. Patient appears to be currently rate controlled. - continue coreg   Hyperkalemia: Mild. Potasssium 5.2 on admission. No significant EKG changes or ectopy appreciated. -  IV fluids as seen above -  Recheck in am  Atypical chest pain:  Chronic.  No change in symptoms. No significant EKG changes seen. - continue to monitor  Abdominal pain: Chronic - morphine prn   Shortness of breath  - Duonebs as needed    Essential hypertension: stable - continue to monitor  Tobacco abuse  - Offered patient a nicotine patch while hospitalized    DVT prophylaxis of SCDs Code Status:   full Family Communication: bedside Disposition Plan: admit   Total time spent 55 minutes.Greater than 50% of this time was spent in counseling, explanation of diagnosis, planning of further management, and coordination of care  [facility] Pager [phone]  If 7PM-7AM, please contact night-coverage [email or web address] Password [facility] 07/30/2015, 10:34 PM

## 2015-07-30 NOTE — Progress Notes (Signed)
Patient stated that he wanted to leave the hospital. Bedside RN contacted MD to make them aware. MD told RN to encourage the patient to stay but if he refused to have the patient sign an AMA form and that he could leave. Bedside RN and Charge RN encouraged the patient to stay in the hospital because he was admitted with GI bleeding and found to have bleeding during a flexsigmoidoscopy this morning, for which surgery was consulted. Patient was aware that he was supposed to have a procedure tomorrow. The risks of leaving with a known GI bleed were discussed with the patient and the RNs made every attempt to convince the patient to stay. The patient was addiment about leaving the hospital stating that he "had to go pick up some marijuana to sell and that he would be back to the hospital at 7pm today." The patient could barely walk stating that his legs were hurting him and had to stop to rest several times while walking to the exit, but it seemed this was not a new problem per the patient. The patient's clothing was soiled with stool and he was carrying his belongings in a plastic bag, which was heavy as the Probation officer assisted the patient with carrying it since he was having trouble walking. The Charge RN escorted the patient to outside of the Education Entrance where the patient stated he was going to sit for a few minutes. Charge RN wanted to escort the patient all the way to the bus stop, but he refused. As the Charge RN was walking away, the patient was seen reaching into the garbage can.   Othella Boyer [facility] 07/30/2015 2:17 PM

## 2015-07-30 NOTE — Progress Notes (Signed)
Please see sigmoidoscopy report  The rectal polyp is the source of bleeding.  It was biopsied. Tomorrow, please consult Dr. Georgette Dover (saw him last year) or his coverage to evaluate for trans-anal excision.

## 2015-07-30 NOTE — ED Provider Notes (Addendum)
CSN: [id number]     Arrival date & time 07/30/15  1718 History   First MD Initiated Contact with Patient 07/30/15 2124     Chief Complaint  Patient presents with  . Rectal Bleeding  . Shortness of Breath  . Abdominal Pain     (Consider location/radiation/quality/duration/timing/severity/associated sxs/prior Treatment) Patient is a 70 y.o. male presenting with hematochezia, shortness of breath, and abdominal pain. The history is provided by the patient.  Rectal Bleeding Associated symptoms: abdominal pain   Shortness of Breath Associated symptoms: abdominal pain and chest pain   Abdominal Pain Associated symptoms: chest pain, hematochezia and shortness of breath   He had been admitted to the hospital because of rectal bleeding. Evaluation showed a rectal polyp which was scheduled to be removed tomorrow. He left the hospital [facility] earlier today stating he had some important to do. He tells me that he just when out to smoke a cigarette. He has chronic chest pain and dyspnea which are unchanged. Is also complaining of some abdominal cramping which is unchanged. He has continued to have rectal bleeding through the day.  Past Medical History  Diagnosis Date  . Depression   . Fall at nursing home     December 2014  . History of migraine   . Gout attack   . Complete heart block ([place])     St. Jude pacemaker - followed by Dr. Lovena Le  . Chronic atrial fibrillation ([place])   . HLD (hyperlipidemia)   . GIB (gastrointestinal bleeding)   . BPH (benign prostatic hyperplasia)   . Essential hypertension   . Gastric ulcer   . Non Hodgkin's lymphoma ([place])   . Tubular adenoma of colon    Past Surgical History  Procedure Laterality Date  . Pacemaker generator change      Dr. Lovena Le  . Appendectomy    . Cholecystectomy    . Open reduction of hip Right 05/28/2013    Procedure: OPEN REDUCTION OF HIP;  Surgeon: Renette Butters, MD;  Location: [facility];  Service: Orthopedics;   Laterality: Right;  . Esophagogastroduodenoscopy (egd) with propofol N/A 09/26/2014    Procedure: ESOPHAGOGASTRODUODENOSCOPY (EGD) WITH PROPOFOL;  Surgeon: Jerene Bears, MD;  Location: [facility] ENDOSCOPY;  Service: Endoscopy;  Laterality: N/A;  . Colonoscopy N/A 10/01/2014    Procedure: COLONOSCOPY;  Surgeon: Jerene Bears, MD;  Location: [facility] ENDOSCOPY;  Service: Endoscopy;  Laterality: N/A;  . Partial colectomy N/A 10/05/2014    Procedure: SUBTOTAL COLECTOMY WITH ILEORECTAL ANASTOMOSIS;  Surgeon: Donnie Mesa, MD;  Location: [facility];  Service: General;  Laterality: N/A;  . Flexible sigmoidoscopy N/A 11/07/2014    Procedure: FLEXIBLE SIGMOIDOSCOPY;  Surgeon: Jerene Bears, MD;  Location: [facility] ENDOSCOPY;  Service: Endoscopy;  Laterality: N/A;  . Gastric resection     Family History  Problem Relation Age of Onset  . Colon cancer Mother    Social History  Substance Use Topics  . Smoking status: Current Every Day Smoker -- 0.50 packs/day for 66 years    Types: Cigarettes  . Smokeless tobacco: Former Systems developer    Types: Snuff, Chew     Comment: 05/27/2013 "haven't used chew or snuff in ~ 30 yr"  . Alcohol Use: 0.0 oz/week    0 Standard drinks or equivalent per week     Comment: "I drink a fifth of bourbon a month"    Review of Systems  Respiratory: Positive for shortness of breath.   Cardiovascular: Positive for chest pain.  Gastrointestinal:  Positive for abdominal pain, hematochezia and anal bleeding.  All other systems reviewed and are negative.     Allergies  Review of patient's allergies indicates no known allergies.  Home Medications   Prior to Admission medications   Medication Sig Start Date End Date Taking? Authorizing Provider  albuterol (PROVENTIL HFA;VENTOLIN HFA) 108 (90 Base) MCG/ACT inhaler Inhale 2 puffs into the lungs every 4 (four) hours as needed for wheezing or shortness of breath. [id number]   [name], MD  carvedilol (COREG) 6.25 MG tablet Take 1 tablet (6.25 mg total) by mouth  2 (two) times daily with a meal. 11/16/14   Velvet Bathe, MD  polyethylene glycol (MIRALAX / GLYCOLAX) packet Take 17 g by mouth daily. 07/16/15   Courteney Klickitat, MD  predniSONE (DELTASONE) 20 MG tablet Take 3 tablets (60 mg total) by mouth daily. 07/13/15   Everlene Balls, MD  sodium phosphate Pediatric (FLEET) 3.5-9.5 GM/59ML enema Place 66 mLs (1 enema total) rectally once. 07/16/15   Courteney Lyn Mackuen, MD  traMADol (ULTRAM) 50 MG tablet Take 1 tablet (50 mg total) by mouth every 6 (six) hours as needed. Patient taking differently: Take 50 mg by mouth every 6 (six) hours as needed for moderate pain.  07/14/15   Etta Quill, NP   BP 134/79 mmHg  Pulse 98  Temp(Src) 98.6 F (37 C) (Oral)  Resp 18  SpO2 98% Physical Exam  Nursing note and vitals reviewed.  70 year old male, resting comfortably and in no acute distress. Vital signs are normal. Oxygen saturation is 98%, which is normal. Head is normocephalic and atraumatic. PERRLA, EOMI. Oropharynx is clear. Neck is nontender and supple without adenopathy or JVD. Back is nontender and there is no CVA tenderness. Lungs are clear without rales, wheezes, or rhonchi. Chest is nontender. Heart has regular rate and rhythm without murmur. Abdomen is soft, flat, nontender without masses or hepatosplenomegaly and peristalsis is normoactive. Extremities have no cyanosis or edema, full range of motion is present. Skin is warm and dry without rash. Neurologic: Mental status is normal, cranial nerves are intact, there are no motor or sensory deficits.  ED Course  Procedures (including critical care time) Labs Review Results for orders placed or performed during the hospital encounter of 07/30/15  Comprehensive metabolic panel  Result Value Ref Range   Sodium 140 135 - 145 mmol/L   Potassium 5.2 (H) 3.5 - 5.1 mmol/L   Chloride 108 101 - 111 mmol/L   CO2 25 22 - 32 mmol/L   Glucose, Bld 99 65 - 99 mg/dL   BUN 20 6 - 20 mg/dL   Creatinine, Ser  1.13 0.61 - 1.24 mg/dL   Calcium 9.2 8.9 - 10.3 mg/dL   Total Protein 7.5 6.5 - 8.1 g/dL   Albumin 3.7 3.5 - 5.0 g/dL   AST 19 15 - 41 U/L   ALT 17 17 - 63 U/L   Alkaline Phosphatase 118 38 - 126 U/L   Total Bilirubin 0.4 0.3 - 1.2 mg/dL   GFR calc non Af Amer >60 >60 mL/min   GFR calc Af Amer >60 >60 mL/min   Anion gap 7 5 - 15  CBC  Result Value Ref Range   WBC 7.8 4.0 - 10.5 K/uL   RBC 3.77 (L) 4.22 - 5.81 MIL/uL   Hemoglobin 11.1 (L) 13.0 - 17.0 g/dL   HCT 35.7 (L) 39.0 - 52.0 %   MCV 94.7 78.0 - 100.0 fL   MCH 29.4 26.0 -  34.0 pg   MCHC 31.1 30.0 - 36.0 g/dL   RDW 15.1 11.5 - 15.5 %   Platelets 206 150 - 400 K/uL  Type and screen [place]  Result Value Ref Range   ABO/RH(D) O POS    Antibody Screen NEG    Sample Expiration 08/02/2015     I have personally reviewed and evaluated these and lab results as part of my medical decision-making.   EKG Interpretation   Date/Time:  Sunday July 30 2015 17:57:33 EST Ventricular Rate:  103 PR Interval:    QRS Duration: 103 QT Interval:  341 QTC Calculation: 446 R Axis:   120 Text Interpretation:  Atrial fibrillation Right axis deviation Minimal ST  depression, lateral leads When compared with ECG of 07/28/2015, No  significant change was found Confirmed by [facility]  MD, Izabellah Dadisman ([id number]) on  07/30/2015 9:21:05 PM      MDM   Final diagnoses:  Rectal bleeding  Rectal polyp    Rectal bleeding from anal polyp. He is hemodynamically stable. Old records were reviewed confirming that he is scheduled for endoscopic removal of rectal polyp tomorrow. Hemoglobin today is actually higher than when he had been admitted 2 days ago. Case is discussed with Dr. Tamala Julian of [facility] who agrees to admit the patient.    [name], MD [id number]  [name], MD [id number]  [name], MD [id number]

## 2015-07-30 NOTE — Progress Notes (Signed)
PT Cancellation Note  Patient Details Name: [name] MRN: [id number] DOB: 15-Oct-1945   Cancelled Treatment:    Reason Eval/Treat Not Completed: Patient at procedure or test/unavailable. Will check back another day. Thanks.   Weston Anna, MPT Pager: [phone]

## 2015-07-30 NOTE — H&P (View-Only) (Signed)
Gastroenterology and Hepatology Consult Note   History [name] MRN # [id number]  Date of Admission: 07/28/2015 Date of Consultation: 07/29/2015 Referring physician: Dr. Theodis Blaze, MD  Reason for Consultation/Chief Complaint: rectal bleeding  Subjective HPI:  This is a 70 year old man known to Dr. Hilarie Fredrickson from a colonoscopy in April 2016 revealed a cecal mass, large transverse colon polyp and a large rectal polyp. The patient ultimately underwent subtotal colectomy, but had postoperative rectal bleeding. In May 2016 Dr. Hilarie Fredrickson did a sigmoidoscopy and revealed a large friable rectal polyp. 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 I find no operative reports, so it appears that did not happen. Patient states he has intermittent rectal bleeding, and on the day of admission yesterday had multiple blood bowel movements. He has had no further bleeding since admission, and he is anxious to go home soon. He is a very limited historian.  ROS:  Constitutional: States he cannot gain weight "even though I eat well" He admits to depression  All other systems are negative except as noted above in the HPI  Past Medical History Past Medical History  Diagnosis Date  . Depression   . Fall at nursing home     December 2014  . History of migraine   . Gout attack   . Complete heart block ([place])     St. Jude pacemaker - followed by Dr. Lovena Le  . Chronic atrial fibrillation ([place])   . HLD (hyperlipidemia)   . GIB (gastrointestinal bleeding)   . BPH (benign prostatic hyperplasia)   . Essential hypertension   . Gastric ulcer   . Non Hodgkin's lymphoma ([place])   . Tubular adenoma of colon     Past Surgical History Past Surgical History  Procedure Laterality Date  . Pacemaker generator change      Dr. Lovena Le  . Appendectomy    . Cholecystectomy    . Open reduction of hip Right 05/28/2013    Procedure:  OPEN REDUCTION OF HIP;  Surgeon: Renette Butters, MD;  Location: [facility];  Service: Orthopedics;  Laterality: Right;  . Esophagogastroduodenoscopy (egd) with propofol N/A 09/26/2014    Procedure: ESOPHAGOGASTRODUODENOSCOPY (EGD) WITH PROPOFOL;  Surgeon: Jerene Bears, MD;  Location: [facility] ENDOSCOPY;  Service: Endoscopy;  Laterality: N/A;  . Colonoscopy N/A 10/01/2014    Procedure: COLONOSCOPY;  Surgeon: Jerene Bears, MD;  Location: [facility] ENDOSCOPY;  Service: Endoscopy;  Laterality: N/A;  . Partial colectomy N/A 10/05/2014    Procedure: SUBTOTAL COLECTOMY WITH ILEORECTAL ANASTOMOSIS;  Surgeon: Donnie Mesa, MD;  Location: [employer];  Service: General;  Laterality: N/A;  . Flexible sigmoidoscopy N/A 11/07/2014    Procedure: FLEXIBLE SIGMOIDOSCOPY;  Surgeon: Jerene Bears, MD;  Location: [facility] ENDOSCOPY;  Service: Endoscopy;  Laterality: N/A;  . Gastric resection      Family History Family History  Problem Relation Age of Onset  . Colon cancer Mother     Social History Social History   Social History  . Marital Status: Divorced    Spouse Name: N/A  . Number of Children: N/A  . Years of Education: N/A   Social History Main Topics  . Smoking status: Current Every Day Smoker -- 0.50 packs/day for 66 years    Types: Cigarettes  . Smokeless tobacco: Former Systems developer    Types: Snuff, Chew     Comment: 05/27/2013 "haven't used chew or snuff in ~ 30  yr"  . Alcohol Use: 0.0 oz/week    0 Standard drinks or equivalent per week     Comment: "I drink a fifth of bourbon a month"  . Drug Use: Yes    Special: Marijuana     Comment: "a joint a month"  . Sexual Activity: No   Other Topics Concern  . None   Social History Narrative    Allergies No Known Allergies  Outpatient Meds Outpatient medications reviewed  Inpatient med list reviewed  _____________________________________________________________________ Objective  Exam:  Current vital signs  Patient Vitals for the past 8 hrs:  BP Temp Temp src  Pulse Resp SpO2  07/29/15 1422 136/60 mmHg 97.3 F (36.3 C) Oral 80 20 100 %  07/29/15 1204 - - - - - 98 %    Intake/Output Summary (Last 24 hours) at 07/29/15 1553 Last data filed at 07/29/15 1200  Gross per 24 hour  Intake 2571.67 ml  Output    201 ml  Net 2370.67 ml    Physical Exam:  General: this is a chronically ill-appearing white male patient in no acute distress, appears older than stated age  Eyes: sclera anicteric, no redness  ENT: oral mucosa moist without lesions, no cervical or supraclavicular lymphadenopathy, poor dentition  CV: RRR without murmur, S1/S2, no JVD,, no peripheral edema  Resp: clear to auscultation bilaterally, normal RR and effort noted  GI: soft, no tenderness, with active bowel sounds. No guarding or palpable organomegaly noted  Skin; warm and dry, no rash or jaundice noted  Neuro: awake, alert and oriented x 3. Normal gross motor function and fluent speech.  Labs:   Recent Labs Lab 07/25/15 0045 07/26/15 1924 07/28/15 2221 07/29/15 0136 07/29/15 0744  WBC 7.6 10.0 5.2  --   --   HGB 10.5* 11.8* 10.3* 10.1* 9.6*  HCT 33.6* 38.9* 32.7* 30.9* 29.4*  PLT 169 214 186  --   --     Recent Labs Lab 07/29/15 0136  NA 139  K 4.4  CL 111  CO2 20*  BUN 24*  ALBUMIN 3.2*  ALKPHOS 102  ALT 17  AST 19  GLUCOSE 114*   No results for input(s): INR in the last 168 hours.  Radiologic studies:   @ASSESSMENTPLANBEGIN @ Impression:  Rectal bleeding Anemia of acute on chronic GI blood loss Rectal adenomatous polyp  The bleeding has now ceased and his hemoglobin is down about a gram from what it was last week.  Plan:  Sigmoidoscopy tomorrow followed by surgical evaluation. The benefits and risks of the planned procedure were described in detail with the patient or (when appropriate) their health care proxy.  Risks were outlined as including, but not limited to, bleeding, infection, perforation, adverse medication reaction leading  to cardiac or pulmonary decompensation, or pancreatitis (if ERCP).  The limitation of incomplete mucosal visualization was also discussed.  No guarantees or warranties were given.     Thank you for the courtesy of this consult.  Please contact me with any questions or concerns.  Nelida Meuse III Pager: [phone] Mon-Fri 8a-5p [phone] after 5p, weekends, holidays

## 2015-07-30 NOTE — ED Notes (Signed)
Pt c/o rectal bleeding starting last night and continuing into today. Also c/o abdominal pain, 10/10, and feeling SOB. Had to retrieve patient from outside while he was smoking a cigarette.

## 2015-07-30 NOTE — ED Notes (Signed)
Hemoccult card  at bedside 

## 2015-07-30 NOTE — Progress Notes (Signed)
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 Dr. Doyle Askew notified of the situation, patient signed the [facility] paper and stated he cannot wait. Charge nurse Janett Billow also tried to encourage patient but he refused to stay.

## 2015-07-30 NOTE — Op Note (Signed)
[facility] [place], [address]   FLEX SIGMOIDOSCOPY PROCEDURE REPORT  PATIENT: [name]  MR#: [id number] BIRTHDATE: 12/27/1945 , 69  yrs. old GENDER: male ENDOSCOPIST: Wilfrid Lund, MD REFERRED BY: PROCEDURE DATE:  08/23/2015 PROCEDURE:   Sigmoidoscopy with biopsy INDICATIONS:hematochezia. MEDICATIONS: Versed 1 mg IV Versed 1 mg given at 09:39 scope in at 09:41 scope out at 09:46  DESCRIPTION OF PROCEDURE:    Physical exam was performed.  Informed consent was obtained from the patient after explaining the benefits, risks, and alternatives to procedure.  The patient was connected to monitor and placed in left lateral position. Continuous oxygen was provided by nasal cannula and IV medicine administered through an indwelling cannula.  After administration of sedation and rectal exam, the patients rectum was intubated and the     colonoscope was advanced under direct visualization to the cecum.  The scope was removed slowly by carefully examining the color, texture, anatomy, and integrity mucosa on the way out.  The patient was recovered in endoscopy and discharged home in satisfactory condition. Estimated blood loss is zero unless otherwise noted in this procedure report.       COLON FINDINGS: The visualized ileum was normal  There was an ileorectal anastomosis.  Very close to the anastomosis, on the most distal valve of [place], was an oblong sessile polyp (approx 15-33mm in diameter) with stigmata of recent bleeding.It looked very similar to photots from an exam in 10/2014.  The polyp was biopsied.  PREP QUALITY: The overall prep quality was fair.  COMPLICATIONS: None  ENDOSCOPIC IMPRESSION: The visualized ileum was normal  There was an ileorectal anastomosis.  Very close to the anastomosis, on the most distal valve of [place], was an oblong sessile polyp (approx 15-20mm in diameter) with stigmata of recent bleeding.  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  _______________________________ eSignedWilfrid Lund, MD August 23, 2015 10:08 AM   CPT [id number] Sigmoidoscopy, flexible; w/ bx, single or multiple ICD CODES:  The ICD and CPT codes recommended by this software are interpretations from the data that the clinical staff has captured with the software.  The verification of the translation of this report to the ICD and CPT codes and modifiers is the sole responsibility of the health care institution and practicing physician where this report was generated.  [place]. will not be held responsible for the validity of the ICD and CPT codes included on this report.  AMA assumes no liability for data contained or not contained herein. CPT is a Designer, television/film set of the [employer].   PATIENT NAME:  [name] MR#: [id number]

## 2015-07-30 NOTE — Evaluation (Signed)
Physical Therapy Evaluation Patient Details Name: [name] MRN: [id number] DOB: 1945/07/10 Today's Date: 07/30/2015   History of Present Illness  70 yo male admitted with GI bleed. Hx of pacemaker, gout, A fib, GI bleed, falls. Pt is homeless.   Clinical Impression  On eval, pt was supervision level for mobility-walked ~75 feet with use of walker. Per RN, pt is homeless. When asked where he plans to go after hospital stay, pt stated "I'll probably stay with some friends."  At this time, do not anticipate any follow up PT needs. Recommend RW if pt will agree to use however unsure if he can manage efficiently in community with a walker if he remains homeless. Reviewed chart history, past notes indicate pt possibly lived in ALF at some point. ALF could be beneficial if this is an option for pt. Recommend SW consult.     Follow Up Recommendations No PT follow up;Supervision - Intermittent    Equipment Recommendations  Rolling walker with 5" wheels    Recommendations for Other Services       Precautions / Restrictions Precautions Precautions: Fall Restrictions Weight Bearing Restrictions: No      Mobility  Bed Mobility Overal bed mobility: Modified Independent                Transfers Overall transfer level: Modified independent                  Ambulation/Gait Ambulation/Gait assistance: Supervision Ambulation Distance (Feet): 75 Feet Assistive device: Rolling walker (2 wheeled) Gait Pattern/deviations: Step-through pattern;Decreased stride length;Antalgic     General Gait Details: pt reports bil hip pain with ambulation.   Stairs            Wheelchair Mobility    Modified Rankin (Stroke Patients Only)       Balance Overall balance assessment: History of Falls;Needs assistance         Standing balance support: During functional activity Standing balance-Leahy Scale: Fair                               Pertinent Vitals/Pain  Pain Assessment: Faces Faces Pain Scale: Hurts even more Pain Location: abdomen, bil LEs with ambulation Pain Descriptors / Indicators: Sore Pain Intervention(s): Monitored during session;Repositioned    Home Living Family/patient expects to be discharged to:: Unsure                      Prior Function Level of Independence: Independent with assistive device(s)         Comments: pt states he uses a "walking stick"     Hand Dominance        Extremity/Trunk Assessment   Upper Extremity Assessment: Overall WFL for tasks assessed           Lower Extremity Assessment: Generalized weakness      Cervical / Trunk Assessment: Kyphotic  Communication   Communication: No difficulties  Cognition Arousal/Alertness: Awake/alert   Overall Cognitive Status: No family/caregiver present to determine baseline cognitive functioning (pt tends to fabricate -"Im getting married in [place] tomorrow.")                      General Comments      Exercises        Assessment/Plan    PT Assessment Patient needs continued PT services  PT Diagnosis Abnormality of gait;Difficulty walking;Acute pain   PT Problem List  Decreased mobility;Pain;Decreased balance;Decreased activity tolerance;Decreased cognition  PT Treatment Interventions Gait training;DME instruction;Functional mobility training;Therapeutic activities;Patient/family education;Balance training;Therapeutic exercise   PT Goals (Current goals can be found in the Care Plan section) Acute Rehab PT Goals Patient Stated Goal: none stated PT Goal Formulation: With patient Time For Goal Achievement: 08/13/15 Potential to Achieve Goals: Fair    Frequency Min 3X/week   Barriers to discharge        Co-evaluation               End of Session Equipment Utilized During Treatment: Gait belt Activity Tolerance: Patient tolerated treatment well Patient left: in chair;with call bell/phone within reach;with  chair alarm set           Time: [id number] PT Time Calculation (min) (ACUTE ONLY): 14 min   Charges:   PT Evaluation $PT Eval Low Complexity: 1 Procedure     PT G Codes:        Weston Anna, MPT Pager: [phone]

## 2015-07-30 NOTE — ED Notes (Signed)
Notified ED MD of patient status and care plan, MD stated to place standing orders per my judgement of patient status.

## 2015-07-31 ENCOUNTER — Other Ambulatory Visit: Payer: Self-pay | Admitting: General Surgery

## 2015-07-31 ENCOUNTER — Encounter (HOSPITAL_COMMUNITY): Payer: Self-pay | Admitting: Gastroenterology

## 2015-07-31 DIAGNOSIS — R0602 Shortness of breath: Secondary | ICD-10-CM | POA: Diagnosis present

## 2015-07-31 DIAGNOSIS — E43 Unspecified severe protein-calorie malnutrition: Secondary | ICD-10-CM

## 2015-07-31 LAB — BASIC METABOLIC PANEL
ANION GAP: 6 (ref 5–15)
BUN: 22 mg/dL — ABNORMAL HIGH (ref 6–20)
CALCIUM: 8.9 mg/dL (ref 8.9–10.3)
CO2: 24 mmol/L (ref 22–32)
Chloride: 110 mmol/L (ref 101–111)
Creatinine, Ser: 0.78 mg/dL (ref 0.61–1.24)
Glucose, Bld: 110 mg/dL — ABNORMAL HIGH (ref 65–99)
POTASSIUM: 4.3 mmol/L (ref 3.5–5.1)
Sodium: 140 mmol/L (ref 135–145)

## 2015-07-31 LAB — CBC
HCT: 33.3 % — ABNORMAL LOW (ref 39.0–52.0)
Hemoglobin: 10.3 g/dL — ABNORMAL LOW (ref 13.0–17.0)
MCH: 29.5 pg (ref 26.0–34.0)
MCHC: 30.9 g/dL (ref 30.0–36.0)
MCV: 95.4 fL (ref 78.0–100.0)
PLATELETS: 159 10*3/uL (ref 150–400)
RBC: 3.49 MIL/uL — AB (ref 4.22–5.81)
RDW: 15.3 % (ref 11.5–15.5)
WBC: 4.3 10*3/uL (ref 4.0–10.5)

## 2015-07-31 LAB — PROTIME-INR
INR: 1.1 (ref 0.00–1.49)
PROTHROMBIN TIME: 14.4 s (ref 11.6–15.2)

## 2015-07-31 LAB — APTT: APTT: 36 s (ref 24–37)

## 2015-07-31 LAB — MRSA PCR SCREENING: MRSA by PCR: NEGATIVE

## 2015-07-31 MED ORDER — LORAZEPAM 2 MG/ML IJ SOLN
0.5000 mg | Freq: Four times a day (QID) | INTRAMUSCULAR | Status: DC | PRN
  Administered 2015-07-31 – 2015-08-01 (×3): 0.5 mg via INTRAVENOUS
  Filled 2015-07-31 (×3): qty 1

## 2015-07-31 MED ORDER — BOOST / RESOURCE BREEZE PO LIQD
1.0000 | Freq: Three times a day (TID) | ORAL | Status: DC
  Administered 2015-07-31 – 2015-08-04 (×9): 1 via ORAL

## 2015-07-31 MED ORDER — NICOTINE 21 MG/24HR TD PT24
21.0000 mg | MEDICATED_PATCH | Freq: Every day | TRANSDERMAL | Status: DC
  Filled 2015-07-31: qty 1

## 2015-07-31 MED ORDER — IPRATROPIUM-ALBUTEROL 0.5-2.5 (3) MG/3ML IN SOLN
3.0000 mL | RESPIRATORY_TRACT | Status: DC | PRN
  Administered 2015-07-31 – 2015-08-02 (×4): 3 mL via RESPIRATORY_TRACT
  Filled 2015-07-31 (×4): qty 3

## 2015-07-31 MED ORDER — ZOLPIDEM TARTRATE 5 MG PO TABS: 5.0000 mg | ORAL_TABLET | Freq: Every evening | ORAL | Status: DC | PRN

## 2015-07-31 NOTE — Consult Note (Signed)
Reason for Consult: rectal polyp  Referring Physician: Dr. Gevena Barre    HPI: [name] is a 70 year old male known to our service from previous subtotal colectomy with ileorectal anastomosis in April of 2016 by Dr. Georgette Dover for lower GI bleeding, cecal and rectal masses and polyps.  He was readmitted in May for rectal bleeding and had a flexible sigmoidoscopy which showed a rectal mass with pathology revealing tubulovillous adenoma.  He was seen by Dr. Marcello Moores in August who recommended transanal resection, however, I don't think the patient followed up to have it done.  He reports intermittent bleeding, bright red.  He has been off his blood thinners for quite some time.  Last episode he reports 4-5 days of bloody bowel movements.  Denies nausea, vomiting. Reports abdominal pain.  Endorses to weight loss.  Appetite is great.he was admitted 2/17 and signed out AMA yesterday only to return after smoking.  He has not had any blood transfusions.  He had a flexible sigmoidoscopy which demonstrated the mass which is known to be dysplastic.  We have therefore been asked to evaluate.    Past Medical History  Diagnosis Date  . Depression   . Fall at nursing home     December 2014  . History of migraine   . Gout attack   . Complete heart block ([place])     St. Jude pacemaker - followed by Dr. Lovena Le  . Chronic atrial fibrillation ([place])   . HLD (hyperlipidemia)   . GIB (gastrointestinal bleeding)   . BPH (benign prostatic hyperplasia)   . Essential hypertension   . Gastric ulcer   . Non Hodgkin's lymphoma ([place])   . Tubular adenoma of colon     Past Surgical History  Procedure Laterality Date  . Pacemaker generator change      Dr. Lovena Le  . Appendectomy    . Cholecystectomy    . Open reduction of hip Right 05/28/2013    Procedure: OPEN REDUCTION OF HIP;  Surgeon: Renette Butters, MD;  Location: [facility];  Service: Orthopedics;  Laterality: Right;  . Esophagogastroduodenoscopy (egd) with propofol  N/A 09/26/2014    Procedure: ESOPHAGOGASTRODUODENOSCOPY (EGD) WITH PROPOFOL;  Surgeon: Jerene Bears, MD;  Location: [facility] ENDOSCOPY;  Service: Endoscopy;  Laterality: N/A;  . Colonoscopy N/A 10/01/2014    Procedure: COLONOSCOPY;  Surgeon: Jerene Bears, MD;  Location: [facility] ENDOSCOPY;  Service: Endoscopy;  Laterality: N/A;  . Partial colectomy N/A 10/05/2014    Procedure: SUBTOTAL COLECTOMY WITH ILEORECTAL ANASTOMOSIS;  Surgeon: Donnie Mesa, MD;  Location: [employer];  Service: General;  Laterality: N/A;  . Flexible sigmoidoscopy N/A 11/07/2014    Procedure: FLEXIBLE SIGMOIDOSCOPY;  Surgeon: Jerene Bears, MD;  Location: [facility] ENDOSCOPY;  Service: Endoscopy;  Laterality: N/A;  . Gastric resection    . Flexible sigmoidoscopy N/A 07/30/2015    Procedure: FLEXIBLE SIGMOIDOSCOPY;  Surgeon: Doran Stabler, MD;  Location: [facility] ENDOSCOPY;  Service: Endoscopy;  Laterality: N/A;    Family History  Problem Relation Age of Onset  . Colon cancer Mother     Social History:  reports that he has been smoking Cigarettes.  He has a 33 pack-year smoking history. He has quit using smokeless tobacco. His smokeless tobacco use included Snuff and Chew. He reports that he drinks alcohol. He reports that he uses illicit drugs (Marijuana).  Allergies: No Known Allergies  Medications:  Scheduled Meds: . carvedilol  6.25 mg Oral BID WC  . nicotine  21 mg  Transdermal Daily  . predniSONE  60 mg Oral Daily  . sodium chloride flush  3 mL Intravenous Q12H   Continuous Infusions: . sodium chloride 100 mL/hr at 07/31/15 1035   PRN Meds:.acetaminophen **OR** acetaminophen, ipratropium-albuterol, morphine injection, ondansetron **OR** ondansetron (ZOFRAN) IV, zolpidem   Results for orders placed or performed during the hospital encounter of 07/30/15 (from the past 48 hour(s))  Comprehensive metabolic panel     Status: Abnormal   Collection Time: 07/30/15  6:27 PM  Result Value Ref Range   Sodium 140 135 - 145 mmol/L   Potassium  5.2 (H) 3.5 - 5.1 mmol/L   Chloride 108 101 - 111 mmol/L   CO2 25 22 - 32 mmol/L   Glucose, Bld 99 65 - 99 mg/dL   BUN 20 6 - 20 mg/dL   Creatinine, Ser 1.13 0.61 - 1.24 mg/dL   Calcium 9.2 8.9 - 10.3 mg/dL   Total Protein 7.5 6.5 - 8.1 g/dL   Albumin 3.7 3.5 - 5.0 g/dL   AST 19 15 - 41 U/L   ALT 17 17 - 63 U/L   Alkaline Phosphatase 118 38 - 126 U/L   Total Bilirubin 0.4 0.3 - 1.2 mg/dL   GFR calc non Af Amer >60 >60 mL/min   GFR calc Af Amer >60 >60 mL/min    Comment: (NOTE) The eGFR has been calculated using the CKD EPI equation. This calculation has not been validated in all clinical situations. eGFR's persistently <60 mL/min signify possible Chronic Kidney Disease.    Anion gap 7 5 - 15  CBC     Status: Abnormal   Collection Time: 07/30/15  6:27 PM  Result Value Ref Range   WBC 7.8 4.0 - 10.5 K/uL   RBC 3.77 (L) 4.22 - 5.81 MIL/uL   Hemoglobin 11.1 (L) 13.0 - 17.0 g/dL   HCT 35.7 (L) 39.0 - 52.0 %   MCV 94.7 78.0 - 100.0 fL   MCH 29.4 26.0 - 34.0 pg   MCHC 31.1 30.0 - 36.0 g/dL   RDW 15.1 11.5 - 15.5 %   Platelets 206 150 - 400 K/uL  Type and screen [place]     Status: None   Collection Time: 07/30/15  6:27 PM  Result Value Ref Range   ABO/RH(D) O POS    Antibody Screen NEG    Sample Expiration 08/02/2015   CBC     Status: Abnormal   Collection Time: 07/31/15  4:35 AM  Result Value Ref Range   WBC 4.3 4.0 - 10.5 K/uL   RBC 3.49 (L) 4.22 - 5.81 MIL/uL   Hemoglobin 10.3 (L) 13.0 - 17.0 g/dL   HCT 33.3 (L) 39.0 - 52.0 %   MCV 95.4 78.0 - 100.0 fL   MCH 29.5 26.0 - 34.0 pg   MCHC 30.9 30.0 - 36.0 g/dL   RDW 15.3 11.5 - 15.5 %   Platelets 159 150 - 400 K/uL  Basic metabolic panel     Status: Abnormal   Collection Time: 07/31/15  4:35 AM  Result Value Ref Range   Sodium 140 135 - 145 mmol/L   Potassium 4.3 3.5 - 5.1 mmol/L    Comment: RESULT REPEATED AND VERIFIED DELTA CHECK NOTED    Chloride 110 101 - 111 mmol/L   CO2 24 22 - 32  mmol/L   Glucose, Bld 110 (H) 65 - 99 mg/dL   BUN 22 (H) 6 - 20 mg/dL   Creatinine, Ser 0.78 0.61 -  1.24 mg/dL   Calcium 8.9 8.9 - 10.3 mg/dL   GFR calc non Af Amer >60 >60 mL/min   GFR calc Af Amer >60 >60 mL/min    Comment: (NOTE) The eGFR has been calculated using the CKD EPI equation. This calculation has not been validated in all clinical situations. eGFR's persistently <60 mL/min signify possible Chronic Kidney Disease.    Anion gap 6 5 - 15  Protime-INR     Status: None   Collection Time: 07/31/15  4:35 AM  Result Value Ref Range   Prothrombin Time 14.4 11.6 - 15.2 seconds   INR 1.10 0.00 - 1.49  APTT     Status: None   Collection Time: 07/31/15  4:35 AM  Result Value Ref Range   aPTT 36 24 - 37 seconds  MRSA PCR Screening     Status: None   Collection Time: 07/31/15  8:40 AM  Result Value Ref Range   MRSA by PCR NEGATIVE NEGATIVE    Comment:        The GeneXpert MRSA Assay (FDA approved for NASAL specimens only), is one component of a comprehensive MRSA colonization surveillance program. It is not intended to diagnose MRSA infection nor to guide or monitor treatment for MRSA infections.     Dg Chest 2 View  07/30/2015  CLINICAL DATA:  SOB, cough. H/o complete heart block, chronic a-fib, non-Hodgkin's lymphoma. Pt c/o bleeding from the rectum. Smoker x 66 years. EXAM: CHEST  2 VIEW COMPARISON:  07/28/2015 FINDINGS: Lungs are mildly hyperinflated. Heart size is normal. Right-sided transvenous pacemaker leads overlie the right atrium and right ventricle. There is minimal density at the left lung base cava most consistent with atelectasis. There are no focal consolidations. No pleural effusions or pulmonary edema. IMPRESSION: 1. Hyperinflation. 2. Minimal left lower lobe atelectasis. Electronically Signed   By: Nolon Nations M.D.   On: 07/30/2015 18:19    Review of Systems  Constitutional: Positive for weight loss. Negative for fever, chills, malaise/fatigue and  diaphoresis.  Respiratory: Negative for cough, hemoptysis, sputum production, shortness of breath and wheezing.   Cardiovascular: Negative for chest pain, palpitations, orthopnea, claudication, leg swelling and PND.  Gastrointestinal: Positive for abdominal pain and blood in stool. Negative for heartburn, nausea, vomiting, diarrhea, constipation and melena.  Genitourinary: Negative for dysuria, urgency, frequency, hematuria and flank pain.  Neurological: Negative for dizziness, tingling, tremors, sensory change, speech change, focal weakness, seizures, loss of consciousness, weakness and headaches.   Blood pressure 139/70, pulse 100, temperature 98.2 F (36.8 C), temperature source Oral, resp. rate 16, height 5' 9" (1.753 m), weight 48.8 kg (107 lb 9.4 oz), SpO2 99 %. Physical Exam  Constitutional: He is oriented to person, place, and time. He appears cachectic. He does not appear ill. No distress.  Respiratory: Effort normal and breath sounds normal. No respiratory distress.  GI: Soft. Bowel sounds are normal. He exhibits no distension and no mass. There is no rebound and no guarding.  ttp lower abdomen   Musculoskeletal: Normal range of motion. He exhibits no edema or tenderness.  Neurological: He is alert and oriented to person, place, and time.  Skin: Skin is warm and dry. No rash noted. No erythema. No pallor.  Psychiatric: He has a normal mood and affect. His behavior is normal. Judgment and thought content normal.    Assessment/Plan: Rectal polyp-recommend transanal excision.  We will discuss with our colorectal surgeons, Dr. Joanie Coddington and Dr. Marcello Moores.  He has seen Dr. Marcello Moores in August  who recommended excision, but not sure why this was not done.  He is not currently bleeding, h&h are stable.  Depending on colorectal surgeon availability we may proceed with surgery on outpatient basis if he indeed remains stable. Final recommendations to follow soon. Anemia-h&h are stable.  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  Given his lack of follow up, may be appropriate to obtain an inpatient consultation with oncology, but will certainly leave up to primary team.    Oneida Arenas,  ANP-BC 07/31/2015, 3:51 PM

## 2015-07-31 NOTE — Progress Notes (Signed)
PROGRESS NOTE  [name] [id number] DOB: [birth year] DOA: 07/30/2015 PCP: [name], MD  HPI/Recap of past 56 hours: 70 year old male with previous history of subtotal colectomy and ileal rectal anastomoses admitted in May 2016 for rectal bleeding and flexible sigmoidoscopy noted tubulovillous adenoma as cause. Initially recommended for transanal resection the patient did not have this done for unclear etiology. Since then, patient said intermittent rectal bleeding and then admitted on 2/17 with bloody bowel movements times several days at that time undergoing flexible sigmoidoscopy which again noted polyp felt to be cause. Patient left AMA on 2/18 1 and 2, to smoke. He was readmitted on 2/19. Surgery consulted to see. Patient himself doing okay, complains of some intermittent rectal bleeding and minimal abdominal pain. Otherwise no complaints   Assessment/Plan: Principal Problem:   Rectal bleeding felt to be secondary to dysplastic rectal polyp: Awaiting surgical evaluation. Concerned if he has a resection done as outpatient, he will not follow up. Hemoglobin stable, recheck in morning Active Problems:   Atrial fibrillation [facility]): Rate controlled. Chads 2*score of 4, unable to be on anticoagulation due to inconsistent follow-up   Abdominal pain   Essential hypertension: Stable   Dysplastic rectal polyp Tobacco abuse: Stable Protein calorie malnutrition: Patient meets criteria for severe malnutrition in the context of social or environmental circumstances plus underweight. Seen by nutrition. Diet advancement once decision made on disposition  Code Status:  Full code  Family Communication: Left message with family   Disposition Plan: Pending surgical evaluation    Consultants:  Surgery   Procedures:  None, recent flexible sigmoidoscopy   Antibiotics:  None    Objective: BP 139/70 mmHg  Pulse 100  Temp(Src) 98.2 F (36.8 C) (Oral)  Resp 16  Ht 5\' 9"  (1.753 m)   Wt 48.8 kg (107 lb 9.4 oz)  BMI 15.88 kg/m2  SpO2 99%  Intake/Output Summary (Last 24 hours) at 07/31/15 1639 Last data filed at 07/31/15 1300  Gross per 24 hour  Intake      0 ml  Output    200 ml  Net   -200 ml   Filed Weights   07/31/15 0828  Weight: 48.8 kg (107 lb 9.4 oz)    Exam:   General:  Alert and oriented 3   Cardiovascular: Regular rate and rhythm, S1-S2   Respiratory: Decreased breath sounds throughout   Abdomen: Soft, nontender, nondistended, hypoactive bowel sounds   Musculoskeletal: No clubbing or cyanosis or edema    Data Reviewed: Basic Metabolic Panel:  Recent Labs Lab 07/28/15 2222 07/29/15 0136 07/30/15 0618 07/30/15 1827 07/31/15 0435  NA 140 139 135 140 140  K 4.4 4.4 4.4 5.2* 4.3  CL 108 111 109 108 110  CO2 22 20* 21* 25 24  GLUCOSE 125* 114* 107* 99 110*  BUN 30* 24* 12 20 22*  CREATININE 0.83 0.72 0.68 1.13 0.78  CALCIUM 9.1 8.6* 8.4* 9.2 8.9   Liver Function Tests:  Recent Labs Lab 07/29/15 0136 07/30/15 1827  AST 19 19  ALT 17 17  ALKPHOS 102 118  BILITOT 0.5 0.4  PROT 6.6 7.5  ALBUMIN 3.2* 3.7   No results for input(s): LIPASE, AMYLASE in the last 168 hours. No results for input(s): AMMONIA in the last 168 hours. CBC:  Recent Labs Lab 07/26/15 1924 07/28/15 2221 07/29/15 0136 07/29/15 0744 07/30/15 0618 07/30/15 1827 07/31/15 0435  WBC 10.0 5.2  --   --  5.8 7.8 4.3  HGB 11.8* 10.3* 10.1* 9.6*  10.0* 11.1* 10.3*  HCT 38.9* 32.7* 30.9* 29.4* 31.9* 35.7* 33.3*  MCV 94.9 93.4  --   --  94.4 94.7 95.4  PLT 214 186  --   --  165 206 159   Cardiac Enzymes:    Recent Labs Lab 07/25/15 0045 07/28/15 2341  TROPONINI <0.03 <0.03   BNP (last 3 results)  Recent Labs  04/13/15 0855 07/15/15 1536  BNP 193.3* 183.8*    ProBNP (last 3 results) No results for input(s): PROBNP in the last 8760 hours.  CBG: No results for input(s): GLUCAP in the last 168 hours.  Recent Results (from the past 240 hour(s))   MRSA PCR Screening     Status: None   Collection Time: 07/31/15  8:40 AM  Result Value Ref Range Status   MRSA by PCR NEGATIVE NEGATIVE Final    Comment:        The GeneXpert MRSA Assay (FDA approved for NASAL specimens only), is one component of a comprehensive MRSA colonization surveillance program. It is not intended to diagnose MRSA infection nor to guide or monitor treatment for MRSA infections.      Studies: Dg Chest 2 View  07/30/2015  CLINICAL DATA:  SOB, cough. H/o complete heart block, chronic a-fib, non-Hodgkin's lymphoma. Pt c/o bleeding from the rectum. Smoker x 66 years. EXAM: CHEST  2 VIEW COMPARISON:  07/28/2015 FINDINGS: Lungs are mildly hyperinflated. Heart size is normal. Right-sided transvenous pacemaker leads overlie the right atrium and right ventricle. There is minimal density at the left lung base cava most consistent with atelectasis. There are no focal consolidations. No pleural effusions or pulmonary edema. IMPRESSION: 1. Hyperinflation. 2. Minimal left lower lobe atelectasis. Electronically Signed   By: Nolon Nations M.D.   On: 07/30/2015 18:19    Scheduled Meds: . carvedilol  6.25 mg Oral BID WC  . feeding supplement  1 Container Oral TID BM  . nicotine  21 mg Transdermal Daily  . predniSONE  60 mg Oral Daily  . sodium chloride flush  3 mL Intravenous Q12H    Continuous Infusions: . sodium chloride 100 mL/hr at 07/31/15 1035     Time spent: 15 minutes   [facility] Pager [phone] . If 7PM-7AM, please contact night-coverage at [email or web address], password [facility] 07/31/2015, 4:39 PM  LOS: 1 day

## 2015-07-31 NOTE — ED Notes (Signed)
Called unit to give report. Nurse put on hold for over two minutes

## 2015-07-31 NOTE — Discharge Summary (Signed)
Physician Discharge Summary  [name] [id number] DOB: 1946-05-14 DOA: 07/28/2015  PCP: [name], MD  Admit date: 07/28/2015 Discharge date: 07/30/2015  Recommendations for Outpatient Follow-up:  1. Pt left AMA  Discharge Diagnoses:  Principal Problem:   GIB (gastrointestinal bleeding) Active Problems:   Atrial fibrillation (HCC)   Essential hypertension   Acute blood loss anemia   Adenomatous rectal polyp   GI bleeding   Dysplastic rectal polyp  Discharge Condition: Stable  Diet recommendation: as tolerated     Brief narrative:    70 y.o. 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   In emergency department, patient's vital signs were within normal limits. Blood work notable for hemoglobin drop from 11.8 to 10.3 in just two days ,Cr 0.83, BUN 30, platelets 186K, otherwise unremarkable. FOBT +. Chest x-ray consistent with COPD but no active disease.   Assessment/Plan:    Principal Problem:  GIB (gastrointestinal bleeding) - per flex sig in 10/2014, Ileorectal anastomosis with very superficial erosion, rectal mass with friable surfaces, previously biopsied found to be tubular adenoma with at least high-grade dysplasia - pt denies further episodes, wants to eat - GI team consulted, s/p flex sig - recommended surgery consult in AM but pt insisted on leaving AMA - did not wait for print out instructions either   Active Problems:  Atrial fibrillation ([place]) - CHADS 2 score 4, no AC due to high risk bleed - rate controlled    Underweight  - Body mass index is 15.4  kg/(m^2).  DVT prophylaxis - SCD's  Code Status: Full.  Family Communication: plan of care discussed with the patient Disposition Plan: Leaving AMA  IV access:  Peripheral IV  Procedures and diagnostic studies:    Ct Abdomen Pelvis W Contrast 07/16/2015 Significant stool within the distal colonic loops consistent with impaction. No definite mass is identified as a cause for obstruction. 2. Marked atherosclerosis of the abdominal aorta and iliac arteries. 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 Clinical correlation is recommended regarding possible right lower extremity ischemic symptoms. 3. Status post partial colectomy. 4. Cardiomegaly. Coronary stent. 5. Status post cholecystectomy. Stable appearance of mildly dilated intra and extrahepatic biliary ducts. 6. Stable right adrenal lesion, consistent with a benign adenoma given the long-term stability. 7. Bilateral renal cysts. 8. Partial small bowel malrotation.   Dg Chest Port 1 View 07/29/2015 No active disease.   Medical Consultants:  GI  Other Consultants:  None   IAnti-Infectives:   None         Discharge Exam: Filed Vitals:   07/30/15 1000 07/30/15 1323  BP: 124/81 119/72  Pulse: 105 66  Temp:  98.6 F (37 C)  Resp: 22 20   Filed Vitals:   07/30/15 0950 07/30/15 0955 07/30/15 1000 07/30/15 1323  BP: 113/57 112/41 124/81 119/72  Pulse: 93 100 105 66  Temp:    98.6 F (37 C)  TempSrc:    Oral  Resp: 14 18 22 20   Height:      Weight:      SpO2: 100% 96% 99% 99%    General: Pt is alert, NAD, refusing  exam  Discharge Instructions     Medication List    STOP taking these medications        aspirin EC 81 MG tablet     polyethylene glycol packet  Commonly known as:  MIRALAX / GLYCOLAX     sodium phosphate Pediatric 3.5-9.5 GM/59ML enema      TAKE these medications        albuterol 108 (90 Base) MCG/ACT inhaler  Commonly known as:  PROVENTIL HFA;VENTOLIN  HFA  Inhale 2 puffs into the lungs every 4 (four) hours as needed for wheezing or shortness of breath.     carvedilol 6.25 MG tablet  Commonly known as:  COREG  Take 1 tablet (6.25 mg total) by mouth 2 (two) times daily with a meal.     predniSONE 20 MG tablet  Commonly known as:  DELTASONE  Take 3 tablets (60 mg total) by mouth daily.     traMADol 50 MG tablet  Commonly known as:  ULTRAM  Take 1 tablet (50 mg total) by mouth every 6 (six) hours as needed.          The results of significant diagnostics from this hospitalization (including imaging, microbiology, ancillary and laboratory) are listed below for reference.     Microbiology: No results found for this or any previous visit (from the past 240 hour(s)).   Labs: Basic Metabolic Panel:  Recent Labs Lab 07/28/15 2222 07/29/15 0136 07/30/15 0618 07/30/15 1827 07/31/15 0435  NA 140 139 135 140 140  K 4.4 4.4 4.4 5.2* 4.3  CL 108 111 109 108 110  CO2 22 20* 21* 25 24  GLUCOSE 125* 114* 107* 99 110*  BUN 30* 24* 12 20 22*  CREATININE 0.83 0.72 0.68 1.13 0.78  CALCIUM 9.1 8.6* 8.4* 9.2 8.9   Liver Function Tests:  Recent Labs Lab 07/29/15 0136 07/30/15 1827  AST 19 19  ALT 17 17  ALKPHOS 102 118  BILITOT 0.5 0.4  PROT 6.6 7.5  ALBUMIN 3.2* 3.7   No results for input(s): LIPASE, AMYLASE in the last 168 hours. No results for input(s): AMMONIA in the last 168 hours. CBC:  Recent Labs Lab 07/26/15 1924 07/28/15 2221 07/29/15 0136 07/29/15 0744 07/30/15 0618 07/30/15 1827 07/31/15 0435  WBC 10.0 5.2  --   --  5.8 7.8 4.3  HGB 11.8* 10.3* 10.1* 9.6* 10.0* 11.1* 10.3*  HCT 38.9* 32.7* 30.9* 29.4* 31.9* 35.7* 33.3*  MCV 94.9 93.4  --   --  94.4 94.7 95.4  PLT 214 186  --   --  165 206 159   Cardiac Enzymes:  Recent Labs Lab 07/25/15 0045 07/28/15 2341  TROPONINI <0.03 <0.03   BNP: BNP (last 3 results)  Recent Labs  04/13/15 0855 07/15/15 1536  BNP 193.3* 183.8*    ProBNP (last 3  results) No results for input(s): PROBNP in the last 8760 hours.  CBG: No results for input(s): GLUCAP in the last 168 hours.   SIGNED: Time coordinating discharge: 30 minutes  Faye Ramsay, MD  [facility] 07/31/2015, 9:03 AM Pager [phone]  If 7PM-7AM, please contact night-coverage [email or web address] Password [email or web address]

## 2015-07-31 NOTE — Progress Notes (Signed)
Initial Nutrition Assessment  DOCUMENTATION CODES:   Severe malnutrition in context of social or environmental circumstances, Underweight  INTERVENTION:  - Diet advancement as medically feasible - RD will continue to monitor for needs  NUTRITION DIAGNOSIS:   Inadequate oral intake related to inability to eat as evidenced by NPO status.  GOAL:   Patient will meet greater than or equal to 90% of their needs  MONITOR:   Diet advancement, Weight trends, Labs, I & O's  REASON FOR ASSESSMENT:   Malnutrition Screening Tool  ASSESSMENT:   70 y.o. 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 Patient was admitted initially on 07/28/2015 for his symptoms of rectal bleeding. Earlier today the patient underwent a flexible sigmoidoscopy and was found to have a oblong sessile polyp approximately 15-20 millimeters with Dr. Loletha Carrow of gastroenterology . However, patient left AMA citing wanting to go out for a smokeand therefore signed papers to be discharged . 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 Patient notes that he still having abdominal pain as achy in nature and rectal bleeding have not been resolved.  Pt seen for MST. BMI indicates underweight status. Pt is NPO and unable to meet needs at this time. Pt reports good appetite PTA and that he was eating consistent meals, unsure of reliability of this information. He does not have any teeth and states that he threw his dentures in a pond. Pt states that he has no difficulty chewing any items, including  meat and raw fruits and vegetables, despite having no teeth or dentures.   Pt reports that over the past 3 months he has been losing weight. He states weight 3 months ago was 104 lbs. Per chart review, pt has been gaining weight since July 2016. Severe muscle and fat wasting noted to upper body; did not assess lower body at this time.   Medications reviewed. Labs reviewed.     Diet Order:  Diet NPO time specified  Skin:  Wound (see comment) (Stage 1 bilateral buttocks pressure ulcers)  Last BM:  2/20  Height:   Ht Readings from Last 1 Encounters:  07/31/15 5\' 9"  (1.753 m)    Weight:   Wt Readings from Last 1 Encounters:  07/31/15 107 lb 9.4 oz (48.8 kg)    Ideal Body Weight:  72.73 kg (kg)  BMI:  Body mass index is 15.88 kg/(m^2).  Estimated Nutritional Needs:   Kcal:  [id number] (30-35 kcal/kg)  Protein:  55-65 grams  Fluid:  1.7-2 L/day  EDUCATION NEEDS:   No education needs identified at this time     Jarome Matin, RD, LDN Inpatient Clinical Dietitian Pager # [phone] After hours/weekend pager # [phone]

## 2015-07-31 NOTE — Care Management Note (Signed)
Case Management Note  Patient Details  Name: BRANDEN FILLINGER MRN: [id number] Date of Birth: Jan 01, 1946  Subjective/Objective: 70 y/o m admitted w/Rectal bleed. From home.                   Action/Plan:d/c plan home.   Expected Discharge Date:   (unknown)               Expected Discharge Plan:  Home/Self Care  In-House Referral:     Discharge planning Services  CM Consult  Post Acute Care Choice:    Choice offered to:     DME Arranged:    DME Agency:     HH Arranged:    HH Agency:     Status of Service:  In process, will continue to follow  Medicare Important Message Given:    Date Medicare IM Given:    Medicare IM give by:    Date Additional Medicare IM Given:    Additional Medicare Important Message give by:     If discussed at [place] of Stay Meetings, dates discussed:    Additional Comments:  Dessa Phi, RN 07/31/2015, 3:21 PM

## 2015-08-01 LAB — RAPID URINE DRUG SCREEN, HOSP PERFORMED
Amphetamines: NOT DETECTED
Barbiturates: NOT DETECTED
Benzodiazepines: NOT DETECTED
Cocaine: NOT DETECTED
Opiates: POSITIVE — AB
Tetrahydrocannabinol: NOT DETECTED

## 2015-08-01 LAB — HEMOGLOBIN AND HEMATOCRIT, BLOOD
HCT: 30.4 % — ABNORMAL LOW (ref 39.0–52.0)
Hemoglobin: 9.3 g/dL — ABNORMAL LOW (ref 13.0–17.0)

## 2015-08-01 MED ORDER — LORAZEPAM 2 MG/ML IJ SOLN
1.0000 mg | INTRAMUSCULAR | Status: DC | PRN
  Administered 2015-08-01 – 2015-08-06 (×4): 1 mg via INTRAVENOUS
  Filled 2015-08-01 (×4): qty 1

## 2015-08-01 MED ORDER — LORAZEPAM 2 MG/ML IJ SOLN
1.0000 mg | Freq: Once | INTRAMUSCULAR | Status: AC
  Administered 2015-08-01: 1 mg via INTRAVENOUS

## 2015-08-01 MED ORDER — LORAZEPAM 2 MG/ML IJ SOLN
INTRAMUSCULAR | Status: AC
  Filled 2015-08-01: qty 1

## 2015-08-01 NOTE — Progress Notes (Signed)
PROGRESS NOTE  [name] [id number] DOB: 30-Aug-1945 DOA: 07/30/2015 PCP: [name], MD  HPI/Recap of past 82 hours: 70 year old male with previous history of subtotal colectomy and ileal rectal anastomoses admitted in May 2016 for rectal bleeding and flexible sigmoidoscopy noted tubulovillous adenoma as cause. Initially recommended for transanal resection the patient did not have this done for unclear etiology. Since then, patient said intermittent rectal bleeding and then admitted on 2/17 with bloody bowel movements times several days at that time undergoing flexible sigmoidoscopy which again noted polyp felt to be cause. Patient left AMA on 2/18 1 and 2, to smoke. He was readmitted on 2/19. Surgery consulted to plan to take patient for polyp resection on Thursday 2/23.  Patient today doing okay. Feels anxious, some abdominal cramping. Having still some rectal bleeding  Assessment/Plan: Principal Problem:   Rectal bleeding felt to be secondary to dysplastic rectal polyp: Polyp resection planned for Thursday. Hemoglobin overall stable, slightly lower this morning Active Problems:   Atrial fibrillation [facility]): Rate controlled. Chads 2*score of 4, unable to be on anticoagulation due to inconsistent follow-up   Abdominal pain   Essential hypertension: Stable   Dysplastic rectal polyp: Tobacco abuse: Stable Protein calorie malnutrition: Patient meets criteria for severe malnutrition in the context of social or environmental circumstances plus underweight. Seen by nutrition. Diet clears until surgery  Code Status:  Full code  Family Communication: Left message with family   Disposition Plan: Likely will be here through the week given surgery on Thursday   Consultants:  Surgery   Procedures:  Plan polyp resection on Thursday 2/23   recent flexible sigmoidoscopy     Antibiotics:  None    Objective: BP 123/59 mmHg  Pulse 91  Temp(Src) 97.6 F (36.4 C) (Oral)  Resp  18  Ht 5\' 9"  (1.753 m)  Wt 48.8 kg (107 lb 9.4 oz)  BMI 15.88 kg/m2  SpO2 99%  Intake/Output Summary (Last 24 hours) at 08/01/15 1613 Last data filed at 08/01/15 0900  Gross per 24 hour  Intake 2515.01 ml  Output    500 ml  Net 2015.01 ml   Filed Weights   07/31/15 0828  Weight: 48.8 kg (107 lb 9.4 oz)    Exam:   General:  Alert and oriented 3 , Slightly anxious   Cardiovascular: Regular rate and rhythm, S1-S2   Respiratory: Decreased breath sounds throughout   Abdomen: Soft, nontender, nondistended, hypoactive bowel sounds   Musculoskeletal: No clubbing or cyanosis or edema    Data Reviewed: Basic Metabolic Panel:  Recent Labs Lab 07/28/15 2222 07/29/15 0136 07/30/15 0618 07/30/15 1827 07/31/15 0435  NA 140 139 135 140 140  K 4.4 4.4 4.4 5.2* 4.3  CL 108 111 109 108 110  CO2 22 20* 21* 25 24  GLUCOSE 125* 114* 107* 99 110*  BUN 30* 24* 12 20 22*  CREATININE 0.83 0.72 0.68 1.13 0.78  CALCIUM 9.1 8.6* 8.4* 9.2 8.9   Liver Function Tests:  Recent Labs Lab 07/29/15 0136 07/30/15 1827  AST 19 19  ALT 17 17  ALKPHOS 102 118  BILITOT 0.5 0.4  PROT 6.6 7.5  ALBUMIN 3.2* 3.7   No results for input(s): LIPASE, AMYLASE in the last 168 hours. No results for input(s): AMMONIA in the last 168 hours. CBC:  Recent Labs Lab 07/26/15 1924 07/28/15 2221  07/29/15 0744 07/30/15 0618 07/30/15 1827 07/31/15 0435 08/01/15 0607  WBC 10.0 5.2  --   --  5.8 7.8 4.3  --  HGB 11.8* 10.3*  < > 9.6* 10.0* 11.1* 10.3* 9.3*  HCT 38.9* 32.7*  < > 29.4* 31.9* 35.7* 33.3* 30.4*  MCV 94.9 93.4  --   --  94.4 94.7 95.4  --   PLT 214 186  --   --  165 206 159  --   < > = values in this interval not displayed. Cardiac Enzymes:    Recent Labs Lab 07/28/15 2341  TROPONINI <0.03   BNP (last 3 results)  Recent Labs  04/13/15 0855 07/15/15 1536  BNP 193.3* 183.8*    ProBNP (last 3 results) No results for input(s): PROBNP in the last 8760 hours.  CBG: No  results for input(s): GLUCAP in the last 168 hours.  Recent Results (from the past 240 hour(s))  MRSA PCR Screening     Status: None   Collection Time: 07/31/15  8:40 AM  Result Value Ref Range Status   MRSA by PCR NEGATIVE NEGATIVE Final    Comment:        The GeneXpert MRSA Assay (FDA approved for NASAL specimens only), is one component of a comprehensive MRSA colonization surveillance program. It is not intended to diagnose MRSA infection nor to guide or monitor treatment for MRSA infections.      Studies: No results found.  Scheduled Meds: . carvedilol  6.25 mg Oral BID WC  . feeding supplement  1 Container Oral TID BM  . nicotine  21 mg Transdermal Daily  . predniSONE  60 mg Oral Daily  . sodium chloride flush  3 mL Intravenous Q12H    Continuous Infusions: . sodium chloride 100 mL/hr at 08/01/15 0849     Time spent: 10 minutes   [place] [facility] Pager [phone] . If 7PM-7AM, please contact night-coverage at [email or web address], password [facility] 08/01/2015, 4:13 PM  LOS: 2 days

## 2015-08-01 NOTE — Progress Notes (Signed)
Patient ID: [name], male   DOB: 28-Jul-1945, 70 y.o.   MRN: [id number]     [facility]      [address] [place], [place] [address]    Phone: [phone] FAX: [phone]     Subjective: Blood on toilet paper.  C/o abdominal pain. Wants to go outside to smoke, has nicotine patch and anxiolytic. hgb down 1 gram but hemodynamically stable.   Objective:  Vital signs:  Filed Vitals:   07/31/15 1400 07/31/15 1531 07/31/15 2155 08/01/15 0608  BP: 139/70  97/63 108/61  Pulse: 100  75 89  Temp: 98.2 F (36.8 C)  98 F (36.7 C) 98 F (36.7 C)  TempSrc: Oral  Oral Oral  Resp:   16 16  Height:      Weight:      SpO2: 97% 99% 98% 97%    Last BM Date: 07/31/15  Intake/Output   Yesterday:  02/20 0701 - 02/21 0700 In: 4196 [P.O.:540; I.V.:3000] Out: 400 [Urine:400] This shift:  Total I/O In: 360 [P.O.:360] Out: 300 [Urine:300]   Physical Exam: General: Pt awake/alert/oriented x4 in no acute distress  Abdomen: Soft.  Nondistended. ttp suprapubic region.  No evidence of peritonitis.  No incarcerated hernias.    Problem List:   Principal Problem:   Rectal bleeding Active Problems:   Atrial fibrillation (HCC)   Abdominal pain   Essential hypertension   Dysplastic rectal polyp   Shortness of breath    Results:   Labs: Results for orders placed or performed during the hospital encounter of 07/30/15 (from the past 48 hour(s))  Comprehensive metabolic panel     Status: Abnormal   Collection Time: 07/30/15  6:27 PM  Result Value Ref Range   Sodium 140 135 - 145 mmol/L   Potassium 5.2 (H) 3.5 - 5.1 mmol/L   Chloride 108 101 - 111 mmol/L   CO2 25 22 - 32 mmol/L   Glucose, Bld 99 65 - 99 mg/dL   BUN 20 6 - 20 mg/dL   Creatinine, Ser 1.13 0.61 - 1.24 mg/dL   Calcium 9.2 8.9 - 10.3 mg/dL   Total Protein 7.5 6.5 - 8.1 g/dL   Albumin 3.7 3.5 - 5.0 g/dL   AST 19 15 - 41 U/L   ALT 17 17 - 63 U/L   Alkaline  Phosphatase 118 38 - 126 U/L   Total Bilirubin 0.4 0.3 - 1.2 mg/dL   GFR calc non Af Amer >60 >60 mL/min   GFR calc Af Amer >60 >60 mL/min    Comment: (NOTE) The eGFR has been calculated using the CKD EPI equation. This calculation has not been validated in all clinical situations. eGFR's persistently <60 mL/min signify possible Chronic Kidney Disease.    Anion gap 7 5 - 15  CBC     Status: Abnormal   Collection Time: 07/30/15  6:27 PM  Result Value Ref Range   WBC 7.8 4.0 - 10.5 K/uL   RBC 3.77 (L) 4.22 - 5.81 MIL/uL   Hemoglobin 11.1 (L) 13.0 - 17.0 g/dL   HCT 35.7 (L) 39.0 - 52.0 %   MCV 94.7 78.0 - 100.0 fL   MCH 29.4 26.0 - 34.0 pg   MCHC 31.1 30.0 - 36.0 g/dL   RDW 15.1 11.5 - 15.5 %   Platelets 206 150 - 400 K/uL  Type and screen [place]     Status: None   Collection Time: 07/30/15  6:27 PM  Result Value Ref Range   ABO/RH(D) O POS    Antibody Screen NEG    Sample Expiration 08/02/2015   CBC     Status: Abnormal   Collection Time: 07/31/15  4:35 AM  Result Value Ref Range   WBC 4.3 4.0 - 10.5 K/uL   RBC 3.49 (L) 4.22 - 5.81 MIL/uL   Hemoglobin 10.3 (L) 13.0 - 17.0 g/dL   HCT 33.3 (L) 39.0 - 52.0 %   MCV 95.4 78.0 - 100.0 fL   MCH 29.5 26.0 - 34.0 pg   MCHC 30.9 30.0 - 36.0 g/dL   RDW 15.3 11.5 - 15.5 %   Platelets 159 150 - 400 K/uL  Basic metabolic panel     Status: Abnormal   Collection Time: 07/31/15  4:35 AM  Result Value Ref Range   Sodium 140 135 - 145 mmol/L   Potassium 4.3 3.5 - 5.1 mmol/L    Comment: RESULT REPEATED AND VERIFIED DELTA CHECK NOTED    Chloride 110 101 - 111 mmol/L   CO2 24 22 - 32 mmol/L   Glucose, Bld 110 (H) 65 - 99 mg/dL   BUN 22 (H) 6 - 20 mg/dL   Creatinine, Ser 0.78 0.61 - 1.24 mg/dL   Calcium 8.9 8.9 - 10.3 mg/dL   GFR calc non Af Amer >60 >60 mL/min   GFR calc Af Amer >60 >60 mL/min    Comment: (NOTE) The eGFR has been calculated using the CKD EPI equation. This calculation has not been validated  in all clinical situations. eGFR's persistently <60 mL/min signify possible Chronic Kidney Disease.    Anion gap 6 5 - 15  Protime-INR     Status: None   Collection Time: 07/31/15  4:35 AM  Result Value Ref Range   Prothrombin Time 14.4 11.6 - 15.2 seconds   INR 1.10 0.00 - 1.49  APTT     Status: None   Collection Time: 07/31/15  4:35 AM  Result Value Ref Range   aPTT 36 24 - 37 seconds  MRSA PCR Screening     Status: None   Collection Time: 07/31/15  8:40 AM  Result Value Ref Range   MRSA by PCR NEGATIVE NEGATIVE    Comment:        The GeneXpert MRSA Assay (FDA approved for NASAL specimens only), is one component of a comprehensive MRSA colonization surveillance program. It is not intended to diagnose MRSA infection nor to guide or monitor treatment for MRSA infections.   Hemoglobin and hematocrit, blood     Status: Abnormal   Collection Time: 08/01/15  6:07 AM  Result Value Ref Range   Hemoglobin 9.3 (L) 13.0 - 17.0 g/dL   HCT 30.4 (L) 39.0 - 52.0 %  Urine rapid drug screen (hosp performed)     Status: Abnormal   Collection Time: 08/01/15  7:58 AM  Result Value Ref Range   Opiates POSITIVE (A) NONE DETECTED   Cocaine NONE DETECTED NONE DETECTED   Benzodiazepines NONE DETECTED NONE DETECTED   Amphetamines NONE DETECTED NONE DETECTED   Tetrahydrocannabinol NONE DETECTED NONE DETECTED   Barbiturates NONE DETECTED NONE DETECTED    Comment:        DRUG SCREEN FOR MEDICAL PURPOSES ONLY.  IF CONFIRMATION IS NEEDED FOR ANY PURPOSE, NOTIFY LAB WITHIN 5 DAYS.        LOWEST DETECTABLE LIMITS FOR URINE DRUG SCREEN Drug Class       Cutoff (ng/mL) Amphetamine  1000 Barbiturate      200 Benzodiazepine   973 Tricyclics       532 Opiates          300 Cocaine          300 THC              50     Imaging / Studies: Dg Chest 2 View  07/30/2015  CLINICAL DATA:  SOB, cough. H/o complete heart block, chronic a-fib, non-Hodgkin's lymphoma. Pt c/o bleeding from the  rectum. Smoker x 66 years. EXAM: CHEST  2 VIEW COMPARISON:  07/28/2015 FINDINGS: Lungs are mildly hyperinflated. Heart size is normal. Right-sided transvenous pacemaker leads overlie the right atrium and right ventricle. There is minimal density at the left lung base cava most consistent with atelectasis. There are no focal consolidations. No pleural effusions or pulmonary edema. IMPRESSION: 1. Hyperinflation. 2. Minimal left lower lobe atelectasis. Electronically Signed   By: Nolon Nations M.D.   On: 07/30/2015 18:19    Medications / Allergies:  Scheduled Meds: . carvedilol  6.25 mg Oral BID WC  . feeding supplement  1 Container Oral TID BM  . nicotine  21 mg Transdermal Daily  . predniSONE  60 mg Oral Daily  . sodium chloride flush  3 mL Intravenous Q12H   Continuous Infusions: . sodium chloride 100 mL/hr at 08/01/15 0849   PRN Meds:.acetaminophen **OR** acetaminophen, ipratropium-albuterol, LORazepam, morphine injection, ondansetron **OR** ondansetron (ZOFRAN) IV, zolpidem  Antibiotics: Anti-infectives    None        Assessment/Plan Hx subtotal colectomy with ileorectal anastomosis in April of 2016 by Dr. Georgette Dover  Rectal polyp-plan for surgical excision on Thursday with Dr. Marcello Moores.  Continue on clears +breeze for now.  Anemia-h&h drifted down but stable.  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 Given his lack of follow up, may be appropriate to obtain an inpatient consultation with oncology, but will certainly leave up to primary team.    Erby Pian, [facility] Pager [phone](7A-4:30P)   08/01/2015 11:33 AM

## 2015-08-02 ENCOUNTER — Inpatient Hospital Stay (HOSPITAL_COMMUNITY): Payer: Medicare Other

## 2015-08-02 DIAGNOSIS — R1013 Epigastric pain: Secondary | ICD-10-CM

## 2015-08-02 DIAGNOSIS — I481 Persistent atrial fibrillation: Secondary | ICD-10-CM

## 2015-08-02 LAB — COMPREHENSIVE METABOLIC PANEL
ALT: 14 U/L — AB (ref 17–63)
AST: 12 U/L — AB (ref 15–41)
Albumin: 3 g/dL — ABNORMAL LOW (ref 3.5–5.0)
Alkaline Phosphatase: 87 U/L (ref 38–126)
Anion gap: 7 (ref 5–15)
BILIRUBIN TOTAL: 0.2 mg/dL — AB (ref 0.3–1.2)
BUN: 11 mg/dL (ref 6–20)
CHLORIDE: 104 mmol/L (ref 101–111)
CO2: 24 mmol/L (ref 22–32)
CREATININE: 0.68 mg/dL (ref 0.61–1.24)
Calcium: 8.6 mg/dL — ABNORMAL LOW (ref 8.9–10.3)
GFR calc Af Amer: >60 mL/min (ref >60)
Glucose, Bld: 96 mg/dL (ref 65–99)
Potassium: 4.4 mmol/L (ref 3.5–5.1)
Sodium: 135 mmol/L (ref 135–145)
TOTAL PROTEIN: 6.2 g/dL — AB (ref 6.5–8.1)

## 2015-08-02 LAB — LIPASE, BLOOD: LIPASE: 41 U/L (ref 11–51)

## 2015-08-02 MED ORDER — OXYCODONE HCL 5 MG PO TABS
5.0000 mg | ORAL_TABLET | Freq: Four times a day (QID) | ORAL | Status: DC | PRN
  Administered 2015-08-02 – 2015-08-06 (×8): 5 mg via ORAL
  Filled 2015-08-02 (×11): qty 1

## 2015-08-02 MED ORDER — SODIUM CHLORIDE 0.9 % IV SOLN
1.0000 g | INTRAVENOUS | Status: AC
  Administered 2015-08-03: 1 g via INTRAVENOUS
  Filled 2015-08-02 (×3): qty 1

## 2015-08-02 MED ORDER — FLEET ENEMA 7-19 GM/118ML RE ENEM
1.0000 | ENEMA | Freq: Once | RECTAL | Status: AC
  Administered 2015-08-03: 1 via RECTAL
  Filled 2015-08-02: qty 1

## 2015-08-02 MED ORDER — IPRATROPIUM-ALBUTEROL 0.5-2.5 (3) MG/3ML IN SOLN
3.0000 mL | Freq: Four times a day (QID) | RESPIRATORY_TRACT | Status: DC | PRN
  Administered 2015-08-03 – 2015-08-04 (×2): 3 mL via RESPIRATORY_TRACT
  Filled 2015-08-02 (×2): qty 3

## 2015-08-02 MED ORDER — POLYETHYLENE GLYCOL 3350 17 G PO PACK: 17.0000 g | PACK | Freq: Two times a day (BID) | ORAL | Status: DC

## 2015-08-02 MED ORDER — IPRATROPIUM-ALBUTEROL 0.5-2.5 (3) MG/3ML IN SOLN
3.0000 mL | Freq: Three times a day (TID) | RESPIRATORY_TRACT | Status: DC
  Administered 2015-08-02 – 2015-08-05 (×8): 3 mL via RESPIRATORY_TRACT
  Filled 2015-08-02 (×9): qty 3

## 2015-08-02 MED ORDER — FLEET ENEMA 7-19 GM/118ML RE ENEM
1.0000 | ENEMA | Freq: Once | RECTAL | Status: AC
  Administered 2015-08-02: 1 via RECTAL
  Filled 2015-08-02: qty 1

## 2015-08-02 NOTE — Care Management Important Message (Signed)
Important Message  Patient Details  Name: [name] MRN: [id number] Date of Birth: 01/19/1946   Medicare Important Message Given:  Yes    Camillo Flaming 08/02/2015, 12:30 [place] Message  Patient Details  Name: [name] MRN: [id number] Date of Birth: May 06, 1946   Medicare Important Message Given:  Yes    Camillo Flaming 08/02/2015, 12:30 PM

## 2015-08-02 NOTE — Progress Notes (Signed)
PROGRESS NOTE  [name] [id number] DOB: [birth year] DOA: 07/30/2015 PCP: [name], MD  HPI/Recap of past 85 hours: 70 year old male with previous history of subtotal colectomy and ileal rectal anastomoses admitted in May 2016 for rectal bleeding and flexible sigmoidoscopy noted tubulovillous adenoma as cause. Initially recommended for transanal resection the patient did not have this done for unclear etiology. Since then, patient said intermittent rectal bleeding and then admitted on 2/17 with bloody bowel movements times several days at that time undergoing flexible sigmoidoscopy which again noted polyp felt to be cause. Patient left AMA on 2/18 1 and 2, to smoke. He was readmitted on 2/19. Surgery consulted to plan to take patient for polyp resection on Thursday 2/23.  Subjective Complaining of abdominal pain, requesting stronger pain medicine   Assessment/Plan: Principal Problem: Hx subtotal colectomy with ileorectal anastomosis in April of 2016 by Dr. Georgette Dover   Rectal bleeding felt to be secondary to dysplastic rectal polyp:per flex sig in 10/2014, Ileorectal anastomosis with very superficial erosion, rectal mass with friable surfaces, previously biopsied found to be tubular adenoma with at least high-grade dysplasia. Polyp resection planned for Thursday. Hemoglobin overall stable, slightly lower this morning     Atrial fibrillation [facility]): Rate controlled. Chads 2*score of 4, unable to be on anticoagulation due to inconsistent follow-up.has been off AC, was on Xarelto in the past    Abdominal pain-CT scan 07/16/15 showed significant stool within the distal colon, possible impaction Continue constipation regimen and repeat KUB.NPO after midnight.     Essential hypertension: Stable   Tobacco abuse: Stable  Protein calorie malnutrition: Patient meets criteria for severe malnutrition in the context of social or environmental circumstances plus underweight. Seen by nutrition. Diet  clears until surgery.Body mass index is 15.88 kg/(m^2).  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 We'll request case management to set up outpatient follow-up with oncology  Code Status:  Full code  Family Communication: Patient awake alert and oriented, communicated with the patient by the bedside  Disposition Plan: Surgery on Thursday, disposition depends on progress   Consultants:  Surgery   Procedures:  Plan polyp resection on Thursday 2/23   recent flexible sigmoidoscopy     Antibiotics:  None    Objective: BP 127/78 mmHg  Pulse 98  Temp(Src) 98.3 F (36.8 C) (Oral)  Resp 20  Ht 5\' 9"  (1.753 m)  Wt 48.8 kg (107 lb 9.4 oz)  BMI 15.88 kg/m2  SpO2 97%  Intake/Output Summary (Last 24 hours) at 08/02/15 1029 Last data filed at 08/02/15 0600  Gross per 24 hour  Intake   1600 ml  Output      0 ml  Net   1600 ml   Filed Weights   07/31/15 0828  Weight: 48.8 kg (107 lb 9.4 oz)    Exam:   General:  Alert and oriented 3 , Slightly anxious   Cardiovascular: Regular rate and rhythm, S1-S2   Respiratory: Decreased breath sounds throughout   Abdomen: Soft, nontender, nondistended, hypoactive bowel sounds   Musculoskeletal: No clubbing or cyanosis or edema    Data Reviewed: Basic Metabolic Panel:  Recent Labs Lab 07/28/15 2222 07/29/15 0136 07/30/15 0618 07/30/15 1827 07/31/15 0435  NA 140 139 135 140 140  K 4.4 4.4 4.4 5.2* 4.3  CL 108 111 109 108 110  CO2 22 20* 21* 25 24  GLUCOSE 125* 114* 107* 99 110*  BUN 30* 24* 12 20 22*  CREATININE 0.83 0.72 0.68 1.13  0.78  CALCIUM 9.1 8.6* 8.4* 9.2 8.9   Liver Function Tests:  Recent Labs Lab 07/29/15 0136 07/30/15 1827  AST 19 19  ALT 17 17  ALKPHOS 102 118  BILITOT 0.5 0.4  PROT 6.6 7.5  ALBUMIN 3.2* 3.7   No results for input(s): LIPASE, AMYLASE in the last 168 hours. No results for input(s): AMMONIA in the last 168 hours. CBC:  Recent Labs Lab  07/26/15 1924 07/28/15 2221  07/29/15 0744 07/30/15 0618 07/30/15 1827 07/31/15 0435 08/01/15 0607  WBC 10.0 5.2  --   --  5.8 7.8 4.3  --   HGB 11.8* 10.3*  < > 9.6* 10.0* 11.1* 10.3* 9.3*  HCT 38.9* 32.7*  < > 29.4* 31.9* 35.7* 33.3* 30.4*  MCV 94.9 93.4  --   --  94.4 94.7 95.4  --   PLT 214 186  --   --  165 206 159  --   < > = values in this interval not displayed. Cardiac Enzymes:    Recent Labs Lab 07/28/15 2341  TROPONINI <0.03   BNP (last 3 results)  Recent Labs  04/13/15 0855 07/15/15 1536  BNP 193.3* 183.8*    ProBNP (last 3 results) No results for input(s): PROBNP in the last 8760 hours.  CBG: No results for input(s): GLUCAP in the last 168 hours.  Recent Results (from the past 240 hour(s))  MRSA PCR Screening     Status: None   Collection Time: 07/31/15  8:40 AM  Result Value Ref Range Status   MRSA by PCR NEGATIVE NEGATIVE Final    Comment:        The GeneXpert MRSA Assay (FDA approved for NASAL specimens only), is one component of a comprehensive MRSA colonization surveillance program. It is not intended to diagnose MRSA infection nor to guide or monitor treatment for MRSA infections.      Studies: No results found.  Scheduled Meds: . carvedilol  6.25 mg Oral BID WC  . feeding supplement  1 Container Oral TID BM  . ipratropium-albuterol  3 mL Nebulization TID  . nicotine  21 mg Transdermal Daily  . predniSONE  60 mg Oral Daily  . sodium chloride flush  3 mL Intravenous Q12H    Continuous Infusions: . sodium chloride 100 mL/hr at 08/02/15 0439     Time spent: 10 minutes   [place] [facility] Pager [phone] . If 7PM-7AM, please contact night-coverage at [email or web address], password [facility] 08/02/2015, 10:29 AM  LOS: 3 days

## 2015-08-02 NOTE — Progress Notes (Addendum)
  Full assessment could not be completed at this time however, CSW provided Pt with Homeless resources and [facility] address and contact information.    Pt stated that he has a brick home that he built and stated that "Monday he is getting married."   [facility] [phone]

## 2015-08-02 NOTE — Progress Notes (Signed)
Patient ID: [name], male   DOB: 1945/11/27, 70 y.o.   MRN: [id number]     [address] [place], [place] [id number]    Phone: [phone] FAX: [phone]     Subjective: No further bloody BMs. VSS. C/o vague abdominal pain. Tolerating clears.   Objective:  Vital signs:  Filed Vitals:   08/01/15 1359 08/01/15 1943 08/02/15 0500 08/02/15 0734  BP: 123/59 115/97 127/78   Pulse: 91 102 98   Temp: 97.6 F (36.4 C) 97.8 F (36.6 C) 98.3 F (36.8 C)   TempSrc: Oral Oral Oral   Resp: 18 16 20    Height:      Weight:      SpO2: 99% 98% 99% 97%    Last BM Date: 08/02/15  Intake/Output   Yesterday:  02/21 0701 - 02/22 0700 In: 1960 [P.O.:360; I.V.:1600] Out: 300 [Urine:300] This shift:     Physical Exam: General: Pt awake/alert/oriented x4 in no acute distress  Abdomen: Soft. Nondistended. ttp suprapubic region. No evidence of peritonitis. No incarcerated hernias.   Problem List:   Principal Problem:   Rectal bleeding Active Problems:   Atrial fibrillation (HCC)   Abdominal pain   Protein-calorie malnutrition, severe (HCC)   Essential hypertension   Dysplastic rectal polyp   Shortness of breath    Results:   Labs: Results for orders placed or performed during the hospital encounter of 07/30/15 (from the past 48 hour(s))  Hemoglobin and hematocrit, blood     Status: Abnormal   Collection Time: 08/01/15  6:07 AM  Result Value Ref Range   Hemoglobin 9.3 (L) 13.0 - 17.0 g/dL   HCT 30.4 (L) 39.0 - 52.0 %  Urine rapid drug screen (hosp performed)     Status: Abnormal   Collection Time: 08/01/15  7:58 AM  Result Value Ref Range   Opiates POSITIVE (A) NONE DETECTED   Cocaine NONE DETECTED NONE DETECTED   Benzodiazepines NONE DETECTED NONE DETECTED   Amphetamines NONE DETECTED NONE DETECTED   Tetrahydrocannabinol NONE DETECTED NONE DETECTED   Barbiturates NONE DETECTED NONE DETECTED   Comment:        DRUG SCREEN FOR MEDICAL PURPOSES ONLY.  IF CONFIRMATION IS NEEDED FOR ANY PURPOSE, NOTIFY LAB WITHIN 5 DAYS.        LOWEST DETECTABLE LIMITS FOR URINE DRUG SCREEN Drug Class       Cutoff (ng/mL) Amphetamine      1000 Barbiturate      200 Benzodiazepine   [id number] Tricyclics       [id number] Opiates          300 Cocaine          300 THC              50     Imaging / Studies: No results found.  Medications / Allergies:  Scheduled Meds: . carvedilol  6.25 mg Oral BID WC  . feeding supplement  1 Container Oral TID BM  . ipratropium-albuterol  3 mL Nebulization TID  . nicotine  21 mg Transdermal Daily  . predniSONE  60 mg Oral Daily  . sodium chloride flush  3 mL Intravenous Q12H   Continuous Infusions: . sodium chloride 100 mL/hr at 08/02/15 0439   PRN Meds:.acetaminophen **OR** acetaminophen, ipratropium-albuterol, LORazepam, morphine injection, ondansetron **OR** ondansetron (ZOFRAN) IV, zolpidem  Antibiotics: Anti-infectives    None        Assessment/Plan Hx  subtotal colectomy with ileorectal anastomosis in April of 2016 by Dr. Georgette Dover  Rectal polyp-plan for surgical excision on Thursday with Dr. Marcello Moores. Continue on clears +breeze, NPO after midnight.   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 Given his lack of follow up, may be appropriate to obtain an inpatient consultation with oncology, but will certainly leave up to primary team.   Erby Pian, [facility] Surgery Pager [phone]-4:[phone])   08/02/2015 10:07 AM

## 2015-08-03 ENCOUNTER — Encounter (HOSPITAL_COMMUNITY)
Admission: EM | Disposition: A | Discharge status: Skilled Nursing Facility | Payer: Self-pay | Source: Home / Self Care | Attending: Internal Medicine

## 2015-08-03 ENCOUNTER — Inpatient Hospital Stay (HOSPITAL_COMMUNITY): Payer: Medicare Other | Admitting: Anesthesiology

## 2015-08-03 ENCOUNTER — Encounter (HOSPITAL_COMMUNITY): Payer: Self-pay | Admitting: Anesthesiology

## 2015-08-03 HISTORY — PX: TRANSANAL EXCISION OF RECTAL MASS: SHX6134

## 2015-08-03 HISTORY — PX: FLEXIBLE SIGMOIDOSCOPY: SHX5431

## 2015-08-03 LAB — TYPE AND SCREEN
ABO/RH(D): O POS
Antibody Screen: NEGATIVE

## 2015-08-03 LAB — COMPREHENSIVE METABOLIC PANEL
ALT: 17 U/L (ref 17–63)
AST: 18 U/L (ref 15–41)
Albumin: 3 g/dL — ABNORMAL LOW (ref 3.5–5.0)
Alkaline Phosphatase: 84 U/L (ref 38–126)
Anion gap: 6 (ref 5–15)
BUN: 14 mg/dL (ref 6–20)
CHLORIDE: 105 mmol/L (ref 101–111)
CO2: 24 mmol/L (ref 22–32)
CREATININE: 0.53 mg/dL — AB (ref 0.61–1.24)
Calcium: 8.7 mg/dL — ABNORMAL LOW (ref 8.9–10.3)
GFR calc Af Amer: >60 mL/min (ref >60)
GFR calc non Af Amer: >60 mL/min (ref >60)
Glucose, Bld: 118 mg/dL — ABNORMAL HIGH (ref 65–99)
POTASSIUM: 4.3 mmol/L (ref 3.5–5.1)
SODIUM: 135 mmol/L (ref 135–145)
Total Bilirubin: 0.2 mg/dL — ABNORMAL LOW (ref 0.3–1.2)
Total Protein: 6 g/dL — ABNORMAL LOW (ref 6.5–8.1)

## 2015-08-03 LAB — CBC
HEMATOCRIT: 30.2 % — AB (ref 39.0–52.0)
Hemoglobin: 9.5 g/dL — ABNORMAL LOW (ref 13.0–17.0)
MCH: 30.1 pg (ref 26.0–34.0)
MCHC: 31.5 g/dL (ref 30.0–36.0)
MCV: 95.6 fL (ref 78.0–100.0)
PLATELETS: 171 10*3/uL (ref 150–400)
RBC: 3.16 MIL/uL — AB (ref 4.22–5.81)
RDW: 15.4 % (ref 11.5–15.5)
WBC: 6.4 10*3/uL (ref 4.0–10.5)

## 2015-08-03 LAB — SURGICAL PCR SCREEN
MRSA, PCR: NEGATIVE
STAPHYLOCOCCUS AUREUS: POSITIVE — AB

## 2015-08-03 SURGERY — EXCISION, MASS, RECTUM, ANAL APPROACH
Anesthesia: General | Site: Rectum

## 2015-08-03 MED ORDER — ONDANSETRON HCL 4 MG/2ML IJ SOLN
INTRAMUSCULAR | Status: AC
  Filled 2015-08-03: qty 2

## 2015-08-03 MED ORDER — ROCURONIUM BROMIDE 100 MG/10ML IV SOLN
INTRAVENOUS | Status: AC
  Filled 2015-08-03: qty 1

## 2015-08-03 MED ORDER — LACTATED RINGERS IV SOLN
INTRAVENOUS | Status: DC | PRN
  Administered 2015-08-03: 09:00:00 via INTRAVENOUS

## 2015-08-03 MED ORDER — FENTANYL CITRATE (PF) 100 MCG/2ML IJ SOLN
INTRAMUSCULAR | Status: DC | PRN
  Administered 2015-08-03 (×3): 25 ug via INTRAVENOUS
  Administered 2015-08-03: 50 ug via INTRAVENOUS

## 2015-08-03 MED ORDER — LABETALOL HCL 5 MG/ML IV SOLN
INTRAVENOUS | Status: AC
  Filled 2015-08-03: qty 4

## 2015-08-03 MED ORDER — LABETALOL HCL 5 MG/ML IV SOLN
INTRAVENOUS | Status: DC | PRN
  Administered 2015-08-03: 2.5 mg via INTRAVENOUS

## 2015-08-03 MED ORDER — 0.9 % SODIUM CHLORIDE (POUR BTL) OPTIME
TOPICAL | Status: DC | PRN
  Administered 2015-08-03: 1000 mL

## 2015-08-03 MED ORDER — ALBUTEROL SULFATE (2.5 MG/3ML) 0.083% IN NEBU
2.5000 mg | INHALATION_SOLUTION | Freq: Once | RESPIRATORY_TRACT | Status: AC
  Administered 2015-08-03: 2.5 mg via RESPIRATORY_TRACT

## 2015-08-03 MED ORDER — SPOT INK MARKER SYRINGE KIT
PACK | SUBMUCOSAL | Status: AC
  Filled 2015-08-03: qty 5

## 2015-08-03 MED ORDER — MUPIROCIN 2 % EX OINT
1.0000 "application " | TOPICAL_OINTMENT | Freq: Two times a day (BID) | CUTANEOUS | Status: DC
  Administered 2015-08-03 – 2015-08-07 (×7): 1 via NASAL
  Filled 2015-08-03: qty 22

## 2015-08-03 MED ORDER — LIDOCAINE HCL (CARDIAC) 20 MG/ML IV SOLN
INTRAVENOUS | Status: DC | PRN
  Administered 2015-08-03: 50 mg via INTRAVENOUS

## 2015-08-03 MED ORDER — FENTANYL CITRATE (PF) 250 MCG/5ML IJ SOLN
INTRAMUSCULAR | Status: AC
  Filled 2015-08-03: qty 5

## 2015-08-03 MED ORDER — LACTATED RINGERS IV SOLN: INTRAVENOUS | Status: DC

## 2015-08-03 MED ORDER — SUGAMMADEX SODIUM 500 MG/5ML IV SOLN
INTRAVENOUS | Status: AC
  Filled 2015-08-03: qty 5

## 2015-08-03 MED ORDER — PREDNISONE 20 MG PO TABS
40.0000 mg | ORAL_TABLET | Freq: Every day | ORAL | Status: DC
  Administered 2015-08-04 – 2015-08-05 (×2): 40 mg via ORAL
  Filled 2015-08-03 (×3): qty 2

## 2015-08-03 MED ORDER — SUGAMMADEX SODIUM 500 MG/5ML IV SOLN
INTRAVENOUS | Status: DC | PRN
  Administered 2015-08-03: 100 mg via INTRAVENOUS

## 2015-08-03 MED ORDER — BUPIVACAINE-EPINEPHRINE 0.25% -1:200000 IJ SOLN
INTRAMUSCULAR | Status: AC
  Filled 2015-08-03: qty 1

## 2015-08-03 MED ORDER — LACTATED RINGERS IR SOLN
Status: DC | PRN
  Administered 2015-08-03: 1000 mL

## 2015-08-03 MED ORDER — PROPOFOL 10 MG/ML IV BOLUS
INTRAVENOUS | Status: AC
  Filled 2015-08-03: qty 20

## 2015-08-03 MED ORDER — LIDOCAINE HCL (CARDIAC) 20 MG/ML IV SOLN
INTRAVENOUS | Status: AC
  Filled 2015-08-03: qty 5

## 2015-08-03 MED ORDER — BUPIVACAINE-EPINEPHRINE (PF) 0.25% -1:200000 IJ SOLN
INTRAMUSCULAR | Status: AC
  Filled 2015-08-03: qty 30

## 2015-08-03 MED ORDER — BUPIVACAINE-EPINEPHRINE 0.25% -1:200000 IJ SOLN
INTRAMUSCULAR | Status: DC | PRN
Start: 2015-08-03 — End: 2015-08-03
  Administered 2015-08-03: 40 mL

## 2015-08-03 MED ORDER — CHLORHEXIDINE GLUCONATE CLOTH 2 % EX PADS
6.0000 | MEDICATED_PAD | Freq: Every day | CUTANEOUS | Status: DC
  Administered 2015-08-07: 6 via TOPICAL

## 2015-08-03 MED ORDER — PROPOFOL 10 MG/ML IV BOLUS
INTRAVENOUS | Status: DC | PRN
  Administered 2015-08-03: 30 mg via INTRAVENOUS
  Administered 2015-08-03: 60 mg via INTRAVENOUS

## 2015-08-03 MED ORDER — ALBUTEROL SULFATE (2.5 MG/3ML) 0.083% IN NEBU
INHALATION_SOLUTION | RESPIRATORY_TRACT | Status: AC
  Filled 2015-08-03: qty 3

## 2015-08-03 MED ORDER — SODIUM CHLORIDE 0.9 % IV SOLN
INTRAVENOUS | Status: DC
Start: 2015-08-03 — End: 2015-08-07
  Administered 2015-08-03: 13:00:00 via INTRAVENOUS
  Administered 2015-08-04: 50 mL/h via INTRAVENOUS
  Administered 2015-08-05 – 2015-08-07 (×3): via INTRAVENOUS

## 2015-08-03 MED ORDER — ROCURONIUM BROMIDE 100 MG/10ML IV SOLN
INTRAVENOUS | Status: DC | PRN
  Administered 2015-08-03: 40 mg via INTRAVENOUS

## 2015-08-03 SURGICAL SUPPLY — 47 items
BAG URO CATCHER STRL LF (MISCELLANEOUS) ×1 IMPLANT
BLADE EXTENDED COATED 6.5IN (ELECTRODE) IMPLANT
BRIEF STRETCH FOR OB PAD LRG (UNDERPADS AND DIAPERS) ×2 IMPLANT
CABLE HIGH FREQUENCY MONO STRZ (ELECTRODE) ×2 IMPLANT
COVER SURGICAL LIGHT HANDLE (MISCELLANEOUS) ×1 IMPLANT
DRAPE SURG IRRIG POUCH 19X23 (DRAPES) ×3 IMPLANT
ELECT PENCIL ROCKER SW 15FT (MISCELLANEOUS) ×1 IMPLANT
ELECT REM PT RETURN 9FT ADLT (ELECTROSURGICAL) ×3
ELECTRODE REM PT RTRN 9FT ADLT (ELECTROSURGICAL) ×1 IMPLANT
GAUZE SPONGE 4X4 12PLY STRL (GAUZE/BANDAGES/DRESSINGS) ×2 IMPLANT
GAUZE SPONGE 4X4 16PLY XRAY LF (GAUZE/BANDAGES/DRESSINGS) IMPLANT
GLOVE BIO SURGEON STRL SZ 6.5 (GLOVE) ×2 IMPLANT
GLOVE BIO SURGEONS STRL SZ 6.5 (GLOVE) ×1
GLOVE BIOGEL PI IND STRL 7.0 (GLOVE) ×1 IMPLANT
GLOVE BIOGEL PI INDICATOR 7.0 (GLOVE) ×2
GOWN STRL REUS W/TWL 2XL LVL3 (GOWN DISPOSABLE) ×3 IMPLANT
GOWN STRL REUS W/TWL XL LVL3 (GOWN DISPOSABLE) ×7 IMPLANT
KIT BASIN OR (CUSTOM PROCEDURE TRAY) ×3 IMPLANT
LUBRICANT JELLY K Y 4OZ (MISCELLANEOUS) ×3 IMPLANT
MANIFOLD NEPTUNE II (INSTRUMENTS) ×3 IMPLANT
NEEDLE HYPO 22GX1.5 SAFETY (NEEDLE) ×1 IMPLANT
NS IRRIG 1000ML POUR BTL (IV SOLUTION) ×3 IMPLANT
PACK LITHOTOMY IV (CUSTOM PROCEDURE TRAY) ×1 IMPLANT
PAD POSITIONING PINK XL (MISCELLANEOUS) ×2 IMPLANT
PLATFORM TRANSANAL ACCESS 4X5 (MISCELLANEOUS) IMPLANT
PLATFORM TRANSANAL ACCESS 4X5. (MISCELLANEOUS) ×3
RETRACTOR STAY HOOK 5MM (MISCELLANEOUS) IMPLANT
RETRACTOR WILSON SYSTEM (INSTRUMENTS) IMPLANT
SET IRRIG TUBING LAPAROSCOPIC (IRRIGATION / IRRIGATOR) ×3 IMPLANT
SHEARS HARMONIC ACE PLUS 36CM (ENDOMECHANICALS) ×3 IMPLANT
SOLUTION ANTI FOG 6CC (MISCELLANEOUS) ×1 IMPLANT
SPONGE LAP 18X18 X RAY DECT (DISPOSABLE) IMPLANT
SUT V-LOC BARB 180 2/0GR6 GS22 (SUTURE)
SUT VIC AB 2-0 SH 27 (SUTURE)
SUT VIC AB 2-0 SH 27X BRD (SUTURE) IMPLANT
SUT VLOC 180 2-0 9IN GS21 (SUTURE) ×2 IMPLANT
SUTURE V-LC BRB 180 2/0GR6GS22 (SUTURE) IMPLANT
SYR CONTROL 10ML LL (SYRINGE) ×1 IMPLANT
TOWEL OR 17X26 10 PK STRL BLUE (TOWEL DISPOSABLE) ×3 IMPLANT
TOWEL OR NON WOVEN STRL DISP B (DISPOSABLE) ×3 IMPLANT
TRAY CATH 16FR W/PLASTIC CATH (SET/KITS/TRAYS/PACK) ×2 IMPLANT
TRAY FOLEY W/METER SILVER 14FR (SET/KITS/TRAYS/PACK) ×1 IMPLANT
TRAY FOLEY W/METER SILVER 16FR (SET/KITS/TRAYS/PACK) ×1 IMPLANT
TRAY LAP CHOLE (CUSTOM PROCEDURE TRAY) ×2 IMPLANT
TUBING INSUF HEATED (TUBING) ×3 IMPLANT
YANKAUER SUCT BULB TIP 10FT TU (MISCELLANEOUS) ×1 IMPLANT
YANKAUER SUCT BULB TIP NO VENT (SUCTIONS) ×1 IMPLANT

## 2015-08-03 NOTE — Anesthesia Preprocedure Evaluation (Addendum)
Anesthesia Evaluation  Patient identified by MRN, date of birth, ID band Patient awake    Reviewed: Allergy & Precautions, NPO status , Patient's Chart, lab work & pertinent test results  History of Anesthesia Complications Negative for: history of anesthetic complications  Airway Mallampati: II  TM Distance: >3 FB Neck ROM: Full    Dental no notable dental hx. (+) Dental Advisory Given, Edentulous Upper, Edentulous Lower   Pulmonary COPD,  COPD inhaler, Current Smoker,    Pulmonary exam normal breath sounds clear to auscultation       Cardiovascular hypertension, Pt. on medications and Pt. on home beta blockers Normal cardiovascular exam+ dysrhythmias (heart block, s/p pacemaker) Atrial Fibrillation + pacemaker  Rhythm:Regular Rate:Normal  Echo 2016: Left ventricle: The cavity size was normal. Wall thickness was increased in a pattern of mild LVH. Systolic function was normal. The estimated ejection fraction was in the range of 50% to 55%. Regional wall motion abnormalities cannot be excluded. - Aortic valve: There was mild regurgitation. - Left atrium: The atrium was mildly dilated.    Neuro/Psych PSYCHIATRIC DISORDERS Anxiety Depression negative neurological ROS     GI/Hepatic negative GI ROS, Neg liver ROS,   Endo/Other  negative endocrine ROS  Renal/GU negative Renal ROS  negative genitourinary   Musculoskeletal negative musculoskeletal ROS (+)   Abdominal   Peds negative pediatric ROS (+)  Hematology negative hematology ROS (+)   Anesthesia Other Findings   Reproductive/Obstetrics negative OB ROS                           Anesthesia Physical Anesthesia Plan  ASA: III  Anesthesia Plan: General   Post-op Pain Management:    Induction: Intravenous  Airway Management Planned: LMA  Additional Equipment:   Intra-op Plan:   Post-operative Plan: Extubation in  OR  Informed Consent: I have reviewed the patients History and Physical, chart, labs and discussed the procedure including the risks, benefits and alternatives for the proposed anesthesia with the patient or authorized representative who has indicated his/her understanding and acceptance.   Dental advisory given  Plan Discussed with: CRNA  Anesthesia Plan Comments:        Anesthesia Quick Evaluation

## 2015-08-03 NOTE — Anesthesia Postprocedure Evaluation (Signed)
Anesthesia Post Note  Patient: [name]  Procedure(s) Performed: Procedure(s) (LRB): TRANSANAL MINIMALLY INVASIVE SURGERY TO RESECT RECTAL POLYP (N/A) FLEXIBLE SIGMOIDOSCOPY (N/A)  Patient location during evaluation: PACU Anesthesia Type: General Level of consciousness: awake and alert Pain management: pain level controlled Vital Signs Assessment: post-procedure vital signs reviewed and stable Respiratory status: spontaneous breathing, nonlabored ventilation, respiratory function stable and patient connected to nasal cannula oxygen Cardiovascular status: blood pressure returned to baseline and stable Postop Assessment: no signs of nausea or vomiting Anesthetic complications: no    Last Vitals:  Filed Vitals:   08/03/15 1225 08/03/15 1234  BP: 151/77 136/64  Pulse:    Temp:  36.4 C  Resp: 16 16    Last Pain:  Filed Vitals:   08/03/15 1236  PainSc: 8                  Margalit Leece JENNETTE

## 2015-08-03 NOTE — Progress Notes (Signed)
Patient ID: [name], male   DOB: 1945/08/31, 70 y.o.   MRN: [id number]     [address] [place], [place] [address]    Phone: [phone] FAX: [phone]     Subjective: No further bloody BMs. VSS.   Objective:  Vital signs:  Filed Vitals:   08/02/15 1952 08/02/15 2100 08/03/15 0515 08/03/15 0752  BP:  138/79 130/72 134/76  Pulse:  49 103 66  Temp:  98.5 F (36.9 C) 98.6 F (37 C) 98 F (36.7 C)  TempSrc:  Oral Oral Oral  Resp:  '20 16 18  ' Height:      Weight:      SpO2: 97% 97% 98% 96%    Last BM Date: 08/03/15  Intake/Output   Yesterday:  02/22 0701 - 02/23 0700 In: 2788.3 [P.O.:120; I.V.:2438.3] Out: 850 [Urine:850] This shift:     Physical Exam: General: Pt awake/alert/oriented x4 in no acute distress  Abdomen: Soft. Nondistended.  No evidence of peritonitis. No incarcerated hernias.   Problem List:   Principal Problem:   Rectal bleeding Active Problems:   Atrial fibrillation (HCC)   Abdominal pain   Protein-calorie malnutrition, severe (HCC)   Essential hypertension   Dysplastic rectal polyp   Shortness of breath    Results:   Labs: Results for orders placed or performed during the hospital encounter of 07/30/15 (from the past 48 hour(s))  Comprehensive metabolic panel     Status: Abnormal   Collection Time: 08/02/15 11:09 AM  Result Value Ref Range   Sodium 135 135 - 145 mmol/L   Potassium 4.4 3.5 - 5.1 mmol/L   Chloride 104 101 - 111 mmol/L   CO2 24 22 - 32 mmol/L   Glucose, Bld 96 65 - 99 mg/dL   BUN 11 6 - 20 mg/dL   Creatinine, Ser 0.68 0.61 - 1.24 mg/dL   Calcium 8.6 (L) 8.9 - 10.3 mg/dL   Total Protein 6.2 (L) 6.5 - 8.1 g/dL   Albumin 3.0 (L) 3.5 - 5.0 g/dL   AST 12 (L) 15 - 41 U/L   ALT 14 (L) 17 - 63 U/L   Alkaline Phosphatase 87 38 - 126 U/L   Total Bilirubin 0.2 (L) 0.3 - 1.2 mg/dL   GFR calc non Af Amer >60 >60 mL/min   GFR calc Af Amer >60 >60  mL/min    Comment: (NOTE) The eGFR has been calculated using the CKD EPI equation. This calculation has not been validated in all clinical situations. eGFR's persistently <60 mL/min signify possible Chronic Kidney Disease.    Anion gap 7 5 - 15  Lipase, blood     Status: None   Collection Time: 08/02/15 11:09 AM  Result Value Ref Range   Lipase 41 11 - 51 U/L  Surgical PCR screen     Status: Abnormal   Collection Time: 08/03/15  1:10 AM  Result Value Ref Range   MRSA, PCR NEGATIVE NEGATIVE   Staphylococcus aureus POSITIVE (A) NEGATIVE    Comment:        The Xpert SA Assay (FDA approved for NASAL specimens in patients over 80 years of age), is one component of a comprehensive surveillance program.  Test performance has been validated by [facility] for patients greater than or equal to 24 year old. It is not intended to diagnose infection nor to guide or monitor treatment.   Comprehensive metabolic panel  Status: Abnormal   Collection Time: 08/03/15  5:23 AM  Result Value Ref Range   Sodium 135 135 - 145 mmol/L   Potassium 4.3 3.5 - 5.1 mmol/L   Chloride 105 101 - 111 mmol/L   CO2 24 22 - 32 mmol/L   Glucose, Bld 118 (H) 65 - 99 mg/dL   BUN 14 6 - 20 mg/dL   Creatinine, Ser 0.53 (L) 0.61 - 1.24 mg/dL   Calcium 8.7 (L) 8.9 - 10.3 mg/dL   Total Protein 6.0 (L) 6.5 - 8.1 g/dL   Albumin 3.0 (L) 3.5 - 5.0 g/dL   AST 18 15 - 41 U/L   ALT 17 17 - 63 U/L   Alkaline Phosphatase 84 38 - 126 U/L   Total Bilirubin 0.2 (L) 0.3 - 1.2 mg/dL   GFR calc non Af Amer >60 >60 mL/min   GFR calc Af Amer >60 >60 mL/min    Comment: (NOTE) The eGFR has been calculated using the CKD EPI equation. This calculation has not been validated in all clinical situations. eGFR's persistently <60 mL/min signify possible Chronic Kidney Disease.    Anion gap 6 5 - 15  CBC     Status: Abnormal   Collection Time: 08/03/15  5:23 AM  Result Value Ref Range   WBC 6.4 4.0 - 10.5 K/uL   RBC 3.16 (L)  4.22 - 5.81 MIL/uL   Hemoglobin 9.5 (L) 13.0 - 17.0 g/dL   HCT 30.2 (L) 39.0 - 52.0 %   MCV 95.6 78.0 - 100.0 fL   MCH 30.1 26.0 - 34.0 pg   MCHC 31.5 30.0 - 36.0 g/dL   RDW 15.4 11.5 - 15.5 %   Platelets 171 150 - 400 K/uL    Imaging / Studies: Dg Abd 1 View  08/02/2015  CLINICAL DATA:  Subtotal colectomy with ileal rectal anastomosis April 2016, abdominal pain and bloody stools currently EXAM: ABDOMEN - 1 VIEW COMPARISON:  07/16/2015 FINDINGS: Air throughout the small bowel, which is not distended. There is gas into the rectum. Bowel anastomosis right lower pelvis noted. IMPRESSION: Nonobstructive gas pattern Electronically Signed   By: Skipper Cliche M.D.   On: 08/02/2015 11:23    Medications / Allergies:  Scheduled Meds: . [MAR Hold] carvedilol  6.25 mg Oral BID WC  . [MAR Hold] Chlorhexidine Gluconate Cloth  6 each Topical Daily  . [MAR Hold] ertapenem  1 g Intravenous 30 min Pre-Op  . [MAR Hold] feeding supplement  1 Container Oral TID BM  . [MAR Hold] ipratropium-albuterol  3 mL Nebulization TID  . [MAR Hold] mupirocin ointment  1 application Nasal BID  . [MAR Hold] nicotine  21 mg Transdermal Daily  . [MAR Hold] predniSONE  60 mg Oral Daily  . [MAR Hold] sodium chloride flush  3 mL Intravenous Q12H   Continuous Infusions: . sodium chloride 100 mL/hr at 08/03/15 0125   PRN Meds:.[MAR Hold] acetaminophen **OR** [MAR Hold] acetaminophen, [MAR Hold] ipratropium-albuterol, [MAR Hold] LORazepam, [MAR Hold]  morphine injection, [MAR Hold] ondansetron **OR** [MAR Hold] ondansetron (ZOFRAN) IV, [MAR Hold] oxyCODONE, [MAR Hold] zolpidem  Antibiotics: Anti-infectives    Start     Dose/Rate Route Frequency Ordered Stop   08/03/15 0800  [MAR Hold]  ertapenem (INVANZ) 1 g in sodium chloride 0.9 % 50 mL IVPB     (MAR Hold since 08/03/15 0831)   1 g 100 mL/hr over 30 Minutes Intravenous 30 min pre-op 08/02/15 1115  Assessment/Plan Hx subtotal colectomy with ileorectal  anastomosis in April of 2016 by Dr. Georgette Dover  Rectal polyp- OR today for transanal excision.  Pt with no further questions.     08/03/2015 8:47 AM

## 2015-08-03 NOTE — Anesthesia Procedure Notes (Addendum)
Procedure Name: LMA Insertion Date/Time: 08/03/2015 9:56 AM Performed by: Anne Fu Pre-anesthesia Checklist: Patient identified, Emergency Drugs available, Suction available, Patient being monitored and Timeout performed Patient Re-evaluated:Patient Re-evaluated prior to inductionOxygen Delivery Method: Circle system utilized Preoxygenation: Pre-oxygenation with 100% oxygen Intubation Type: IV induction Ventilation: Mask ventilation without difficulty LMA: LMA inserted LMA Size: 4.0 Number of attempts: 1 Placement Confirmation: positive ETCO2 and breath sounds checked- equal and bilateral Tube secured with: Tape   Procedure Name: Intubation Date/Time: 08/03/2015 10:36 AM Performed by: Anne Fu Pre-anesthesia Checklist: Patient identified, Emergency Drugs available, Suction available, Patient being monitored and Timeout performed Patient Re-evaluated:Patient Re-evaluated prior to inductionOxygen Delivery Method: Circle system utilized Preoxygenation: Pre-oxygenation with 100% oxygen Intubation Type: IV induction Ventilation: Mask ventilation without difficulty Laryngoscope Size: Mac and 4 Grade View: Grade I Tube type: Oral Tube size: 7.5 mm Number of attempts: 1 Airway Equipment and Method: Stylet Placement Confirmation: ETT inserted through vocal cords under direct vision,  positive ETCO2,  CO2 detector and breath sounds checked- equal and bilateral Secured at: 21 cm Tube secured with: Tape Dental Injury: Teeth and Oropharynx as per pre-operative assessment

## 2015-08-03 NOTE — Op Note (Signed)
07/30/2015 - 08/03/2015  11:18 AM  PATIENT:  [name]  70 y.o. male  Patient Care Team: [name], MD as PCP - General (Family Medicine)  PRE-OPERATIVE DIAGNOSIS:  rectal polyp  POST-OPERATIVE DIAGNOSIS:   rectal polyp  PROCEDURE:   TRANSANAL MINIMALLY INVASIVE SURGERY TO RESECT RECTAL POLYP (PARTIAL PROCTECTOMY) FLEXIBLE PROCTOSCOPY   Surgeon(s): [name], MD  ASSISTANT: none   ANESTHESIA:   general  EBL: 40ml  Total I/O In: -  Out: 200 [Urine:200]  Delay start of Pharmacological VTE agent (>24hrs) due to surgical blood loss or risk of bleeding:  no  DRAINS: none   SPECIMEN:  Source of Specimen:  L posterior rectal polyp  DISPOSITION OF SPECIMEN:  PATHOLOGY  COUNTS:  YES  PLAN OF CARE: patient admitted  PATIENT DISPOSITION:  PACU - hemodynamically stable.  INDICATION: 70 y.o. M with recurrent rectal bleeding and known mid rectal polyp.  The anatomy & physiology of the digestive tract was discussed.  The pathophysiology of the rectal pathology was discussed.  Natural history risks without surgery was discussed.   I feel the risks of no intervention will lead to serious problems that outweigh the operative risks; therefore, I recommended surgery.    Laparoscopic & open abdominal techniques were discussed.  I recommended we start with a partial proctectomy by transanal minimally invasive surgery for excisional biopsy to remove the pathology and hopefully cure and/or control the pathology.  This technique can offer less operative risk and faster post-operative recovery.  Possible need for immediate or later abdominal surgery for further treatment was discussed.   Risks such as bleeding, abscess, reoperation, ostomy, heart attack, death, and other risks were discussed.   I noted a good likelihood this will help address the problem.  Goals of post-operative recovery were discussed as well.  We will work to minimize complications.  An educational handout was given  as well.  Questions were answered.  The patient expresses understanding & wishes to proceed with surgery.  OR FINDINGS: Left posterior lateral mid rectal polyp  The resulting mass was ~2x5 cm in size   The closure rests 5 cm from the anal verge in the left posterior lateral location  DESCRIPTION: Informed consent was confirmed.  Patient received general anesthesia without difficulty.  Foley catheter sterilely placed.  Sequential compression devices active during the entire case.  The patient was placed in the supine position, taking extra care to secure and protect the patient appropriately.  The patient was placed in split leg position. The perineum and perianal regions were prepped and draped in a sterile fashion.  Surgical timeout confirmed our plan.  I began with a flexible sigmoidoscope to evaluate the positioning of the polyp. It was located in the left posterior lateral mid to distal rectum. There was no active hemorrhage. The rectum was free of stool.    I did a gentle digital rectal examination with gradual anal dilation to allow placement of the Gelpoint device.  This was secured to the patient using a ) Vicryl stitch.   The ports were placed through the gel point and this was inserted onto the device.  We induced carbon dioxide insufflation intraluminally.  I oriented the scope and the patient such that the specimen rested towards the floor at the 4:00 position.  I could easily identify the mass.  I went ahead and used tip of the harmonic scalpel to mark margins circumferentially.  I then did a partial thickness transection at the distal margin.  I came around laterally. I lifted the specimen off the pelvic canal for a good deep margin.  I gradually came more proximally.  I transected at the proximal margin.  I ensured hemostasis.  Hemostasis excellent.   I reapproximated the wound with a 2-0 V lock running stitch to close the wound.  the suture was cut. I inspected the cavity using the  camera. The mucosal edges were approximated. Hemostasis was good. The cap was removed. Carbon dioxide was evacuated.  The patient is being extubated go to recovery room.  I am about to discussed operative findings with the patient's family.

## 2015-08-03 NOTE — Transfer of Care (Signed)
Immediate Anesthesia Transfer of Care Note  Patient: [name]  Procedure(s) Performed: Procedure(s): TRANSANAL MINIMALLY INVASIVE SURGERY TO RESECT RECTAL POLYP (N/A) FLEXIBLE SIGMOIDOSCOPY (N/A)  Patient Location: PACU  Anesthesia Type:General  Level of Consciousness:  sedated, patient cooperative and responds to stimulation  Airway & Oxygen Therapy:Patient Spontanous Breathing and Patient connected to face mask oxgen  Post-op Assessment:  Report given to PACU RN and Post -op Vital signs reviewed and stable  Post vital signs:  Reviewed and stable  Last Vitals:  Filed Vitals:   08/03/15 1142 08/03/15 1145  BP:  129/82  Pulse: 112 44  Temp:    Resp: 29 15    Complications: No apparent anesthesia complications

## 2015-08-03 NOTE — Progress Notes (Signed)
Pacer interrogated.   

## 2015-08-03 NOTE — Progress Notes (Signed)
Progress Note   [name] [id number] DOB: Nov 13, 1945 DOA: 07/30/2015 PCP: [name], MD   Brief Narrative:   [name] is an 70 y.o. male with previous history of subtotal colectomy and ileal rectal anastomoses admitted in May 2016 for rectal bleeding and flexible sigmoidoscopy noted tubulovillous adenoma as cause. Initially recommended for transanal resection the patient did not have this done for unclear etiology. Since then, patient has had intermittent rectal bleeding and then admitted on 07/27/14 with bloody bowel movements x several days at that time undergoing flexible sigmoidoscopy which again noted polyp felt to be cause. Patient left AMA on 07/29/15. He was readmitted on 07/30/15 with ongoing rectal bleeding. Surgery consulted with plan to take patient for polyp resection on Thursday 2/23.  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  Active Problems:   Competency issues - We'll ask psychiatrist for competency evaluation.    Atrial fibrillation ([place]) - Continue rate control efforts. Has not been a candidate for blood thinners secondary to compliance issues.    Abdominal pain - CT scan 07/16/15 showed significant stool within the distal colon, possible impaction. - Continue constipation regimen and repeat KUB if persistent.    Protein-calorie malnutrition, severe ([place]) - Patient meets criteria for severe malnutrition in the context of social or environmental circumstances plus underweight. - Seen by nutrition. Diet clears until surgery.Body mass index is 15.88 kg/(m^2). - Diet advanced to full liquids postprocedure. Continue nutritional supplements.    Essential hypertension, controlled - Controlled on Coreg.    Shortness of breath - He appears to be on prednisone 60 mg daily, but I am unable to find the  details as to when this was started or why. - Will wean.    Low-grade non-Hodgkin's B-cell lymphoma, chronic/stable - Follow-up with oncology. Not felt to be a good candidate for treatment in the past.    DVT Prophylaxis - SCDs ordered.   Family Communication/Anticipated D/C date and plan/Code Status   Family Communication: No family at bedside. Patient tells me he is getting married in a few days. Disposition Plan: Reportedly may be homeless. Anticipated D/C date:   08/04/15 if medically stable and hemoglobin does not drop. Code Status: Full code.   IV Access:    Peripheral IV   Procedures and diagnostic studies:   Dg Chest 2 View  07/30/2015  CLINICAL DATA:  SOB, cough. H/o complete heart block, chronic a-fib, non-Hodgkin's lymphoma. Pt c/o bleeding from the rectum. Smoker x 66 years. EXAM: CHEST  2 VIEW COMPARISON:  07/28/2015 FINDINGS: Lungs are mildly hyperinflated. Heart size is normal. Right-sided transvenous pacemaker leads overlie the right atrium and right ventricle. There is minimal density at the left lung base cava most consistent with atelectasis. There are no focal consolidations. No pleural effusions or pulmonary edema. IMPRESSION: 1. Hyperinflation. 2. Minimal left lower lobe atelectasis. Electronically Signed   By: Nolon Nations M.D.   On: 07/30/2015 18:19     Medical Consultants:    Surgery: Dr. [name]  Anti-Infectives:   Anti-infectives    Start     Dose/Rate Route Frequency Ordered Stop   08/03/15 0800  [MAR Hold]  ertapenem (INVANZ) 1 g in sodium chloride 0.9 % 50 mL IVPB     (MAR Hold since 08/03/15 0831)   1 g 100 mL/hr over 30 Minutes Intravenous 30  min pre-op 08/02/15 1115        Subjective:    [name] seems to be a bit confused. He tells me he is getting married in 2 days to a woman who lives in [place]. This seems unlikely. He has left AMA on multiple occasions to go smoke and seemed to have limited insight as well as a known  history of medical nonadherence.  Objective:    Filed Vitals:   08/02/15 1952 08/02/15 2100 08/03/15 0515 08/03/15 0752  BP:  138/79 130/72 134/76  Pulse:  49 103 66  Temp:  98.5 F (36.9 C) 98.6 F (37 C) 98 F (36.7 C)  TempSrc:  Oral Oral Oral  Resp:  20 16 18   Height:      Weight:      SpO2: 97% 97% 98% 96%    Intake/Output Summary (Last 24 hours) at 08/03/15 1004 Last data filed at 08/03/15 [id number]  Gross per 24 hour  Intake 2518.33 ml  Output    850 ml  Net 1668.33 ml   Filed Weights   07/31/15 0828  Weight: 48.8 kg (107 lb 9.4 oz)    Exam: Gen:  NAD, thin Cardiovascular:  RRR, No M/R/G Respiratory:  Lungs CTAB Gastrointestinal:  Abdomen soft, NT/ND, + BS Extremities:  No C/E/C   Data Reviewed:    Labs: Basic Metabolic Panel:  Recent Labs Lab 07/30/15 0618 07/30/15 1827 07/31/15 0435 08/02/15 1109 08/03/15 0523  NA 135 140 140 135 135  K 4.4 5.2* 4.3 4.4 4.3  CL 109 108 110 104 105  CO2 21* 25 24 24 24   GLUCOSE 107* 99 110* 96 118*  BUN 12 20 22* 11 14  CREATININE 0.68 1.13 0.78 0.68 0.53*  CALCIUM 8.4* 9.2 8.9 8.6* 8.7*   GFR Estimated Creatinine Clearance: 61 mL/min (by C-G formula based on Cr of 0.53). Liver Function Tests:  Recent Labs Lab 07/29/15 0136 07/30/15 1827 08/02/15 1109 08/03/15 0523  AST 19 19 12* 18  ALT 17 17 14* 17  ALKPHOS 102 118 87 84  BILITOT 0.5 0.4 0.2* 0.2*  PROT 6.6 7.5 6.2* 6.0*  ALBUMIN 3.2* 3.7 3.0* 3.0*    Recent Labs Lab 08/02/15 1109  LIPASE 41   Coagulation profile  Recent Labs Lab 07/31/15 0435  INR 1.10    CBC:  Recent Labs Lab 07/28/15 2221  07/30/15 0618 07/30/15 1827 07/31/15 0435 08/01/15 0607 08/03/15 0523  WBC 5.2  --  5.8 7.8 4.3  --  6.4  HGB 10.3*  < > 10.0* 11.1* 10.3* 9.3* 9.5*  HCT 32.7*  < > 31.9* 35.7* 33.3* 30.4* 30.2*  MCV 93.4  --  94.4 94.7 95.4  --  95.6  PLT 186  --  165 206 159  --  171  < > = values in this interval not displayed. Cardiac  Enzymes:  Recent Labs Lab 07/28/15 2341  TROPONINI <0.03   Microbiology Recent Results (from the past 240 hour(s))  MRSA PCR Screening     Status: None   Collection Time: 07/31/15  8:40 AM  Result Value Ref Range Status   MRSA by PCR NEGATIVE NEGATIVE Final    Comment:        The GeneXpert MRSA Assay (FDA approved for NASAL specimens only), is one component of a comprehensive MRSA colonization surveillance program. It is not intended to diagnose MRSA infection nor to guide or monitor treatment for MRSA infections.   Surgical PCR screen  Status: Abnormal   Collection Time: 08/03/15  1:10 AM  Result Value Ref Range Status   MRSA, PCR NEGATIVE NEGATIVE Final   Staphylococcus aureus POSITIVE (A) NEGATIVE Final    Comment:        The Xpert SA Assay (FDA approved for NASAL specimens in patients over 13 years of age), is one component of a comprehensive surveillance program.  Test performance has been validated by [facility] for patients greater than or equal to 26 year old. It is not intended to diagnose infection nor to guide or monitor treatment.      Medications:   . [MAR Hold] carvedilol  6.25 mg Oral BID WC  . [MAR Hold] Chlorhexidine Gluconate Cloth  6 each Topical Daily  . [MAR Hold] ertapenem  1 g Intravenous 30 min Pre-Op  . [MAR Hold] feeding supplement  1 Container Oral TID BM  . [MAR Hold] ipratropium-albuterol  3 mL Nebulization TID  . [MAR Hold] mupirocin ointment  1 application Nasal BID  . [MAR Hold] nicotine  21 mg Transdermal Daily  . [MAR Hold] predniSONE  60 mg Oral Daily  . [MAR Hold] sodium chloride flush  3 mL Intravenous Q12H   Continuous Infusions: . sodium chloride 100 mL/hr at 08/03/15 0125    Time spent: 25 minutes   LOS: 4 days   RAMA,CHRISTINA  [facility] Pager [phone]. If unable to reach me by pager, please call my cell phone at [phone].  *Please refer to [email or web address], password [email or web address] to get updated schedule on who  will round on this patient, as hospitalists switch teams weekly. If 7PM-7AM, please contact night-coverage at [email or web address], password [email or web address] for any overnight needs.  08/03/2015, 10:04 AM

## 2015-08-04 ENCOUNTER — Encounter (HOSPITAL_COMMUNITY): Payer: Self-pay | Admitting: General Surgery

## 2015-08-04 ENCOUNTER — Inpatient Hospital Stay (HOSPITAL_COMMUNITY): Payer: Medicare Other

## 2015-08-04 DIAGNOSIS — C884 Extranodal marginal zone B-cell lymphoma of mucosa-associated lymphoid tissue [MALT-lymphoma]: Secondary | ICD-10-CM

## 2015-08-04 DIAGNOSIS — J441 Chronic obstructive pulmonary disease with (acute) exacerbation: Secondary | ICD-10-CM | POA: Diagnosis present

## 2015-08-04 DIAGNOSIS — Z5900 Homelessness unspecified: Secondary | ICD-10-CM

## 2015-08-04 DIAGNOSIS — F4329 Adjustment disorder with other symptoms: Secondary | ICD-10-CM

## 2015-08-04 DIAGNOSIS — K921 Melena: Secondary | ICD-10-CM

## 2015-08-04 DIAGNOSIS — D128 Benign neoplasm of rectum: Secondary | ICD-10-CM

## 2015-08-04 DIAGNOSIS — D62 Acute posthemorrhagic anemia: Secondary | ICD-10-CM

## 2015-08-04 DIAGNOSIS — R1030 Lower abdominal pain, unspecified: Secondary | ICD-10-CM

## 2015-08-04 LAB — CBC
HCT: 32.3 % — ABNORMAL LOW (ref 39.0–52.0)
HEMOGLOBIN: 10 g/dL — AB (ref 13.0–17.0)
MCH: 29.5 pg (ref 26.0–34.0)
MCHC: 31 g/dL (ref 30.0–36.0)
MCV: 95.3 fL (ref 78.0–100.0)
Platelets: 182 10*3/uL (ref 150–400)
RBC: 3.39 MIL/uL — AB (ref 4.22–5.81)
RDW: 15.6 % — ABNORMAL HIGH (ref 11.5–15.5)
WBC: 9.3 10*3/uL (ref 4.0–10.5)

## 2015-08-04 MED ORDER — ERTAPENEM SODIUM 1 G IJ SOLR
1.0000 g | INTRAMUSCULAR | Status: AC
  Administered 2015-08-04: 1 g via INTRAVENOUS
  Filled 2015-08-04: qty 1

## 2015-08-04 MED ORDER — ENSURE ENLIVE PO LIQD
237.0000 mL | Freq: Two times a day (BID) | ORAL | Status: DC
  Administered 2015-08-04 – 2015-08-07 (×5): 237 mL via ORAL

## 2015-08-04 NOTE — Clinical Social Work Note (Signed)
Clinical Social Work Assessment  Patient Details  Name: [name] MRN: [id number] Date of Birth: 12-Dec-1945  Date of referral:                  Reason for consult:  Other (Comment Required) (assess for needs)                Permission sought to share information with:    Permission granted to share information::  No  Name::        Agency::     Relationship::     Contact Information:     Housing/Transportation Living arrangements for the past 2 months:  No permanent address Source of Information:  Patient Patient Interpreter Needed:  None Criminal Activity/Legal Involvement Pertinent to Current Situation/Hospitalization:    Significant Relationships:  Significant Other Lives with:  Other (Comment) (stays in someone's shed) Do you feel safe going back to the place where you live?    Need for family participation in patient care:     Care giving concerns:  No caregiver   Social Worker assessment / plan:  CSW met with pt at bedside to discuss discharge.  CSW prompted pt to discuss history and needs.  CSW provided supportive listening.  Prior CSW provided pt with resources.    Employment status:    Forensic scientist:  Medicare, Medicaid In [place] PT Recommendations:  Not assessed at this time Information / Referral to community resources:     Patient/Family's Response to care:  Pt discussed moving around from place to place and not having a home of his own.  Pt reported that he has a girlfriend that is supportive of him and no family.  Pt reported that he sees a doctor in the community for his high blood pressure and allergies.  Pt reported that he is on disability and has been on it for "20 years".  Pt also reported that he has been living without his own home for "twenty years".    Patient/Family's Understanding of and Emotional Response to Diagnosis, Current Treatment, and Prognosis:  Pt appeared to understand the questions asked of him and was able to answer questions from  the psychiatrist as well.  Pt did not report needing any additional services and stated that he called the ambulance when he knew he was sick (was bleeding from his colon).  Pt understands to follow up with doctors after he is discharged.  Emotional Assessment Appearance:  Appears older than stated age Attitude/Demeanor/Rapport:   (cooperative) Affect (typically observed):  Accepting Orientation:  Oriented to Self, Oriented to Place, Oriented to  Time, Oriented to Situation Alcohol / Substance use:    Psych involvement (Current and /or in the community):  Yes (Comment) (consulted for capacity and met capacity)  Discharge Needs  Concerns to be addressed:    Readmission within the last 30 days:    Current discharge risk:    Barriers to Discharge:  No Barriers Identified   Carlean Jews, LCSW 08/04/2015, 3:56 PM

## 2015-08-04 NOTE — Progress Notes (Addendum)
Addendum: Pt with some mild tenderness to palpation in RLQ.  Unsure if this is related to surgery.  Will cont Invanz today and recheck cbc.  If wbc and exam stable tomorrow, can stop antibiotics.     Damon Adie, MD  [facility]      Patient ID: [name], male   DOB: 01/05/1946, 70 y.o.   MRN: [id number]     [facility]      [address] [place], [place] [address]    Phone: [phone] FAX: [phone]     Subjective: C/o lower abdominal pain.  No n/v.  Having BMs. VSS.  Afebrile. Started after colonoscopy, gradually worsening.   Objective:  Vital signs:  Filed Vitals:   08/04/15 0301 08/04/15 0447 08/04/15 0820 08/04/15 0850  BP:  152/78  135/68  Pulse:  97 75 80  Temp:  98.4 F (36.9 C)  98.6 F (37 C)  TempSrc:  Oral  Oral  Resp:  '20 20 18  ' Height:      Weight:      SpO2: 94% 97% 98% 98%    Last BM Date: 08/04/15  Intake/Output   Yesterday:  02/23 0701 - 02/24 0700 In: 1150 [I.V.:1100; IV Piggyback:50] Out: 200 [Urine:200] This shift:  Total I/O In: 360 [P.O.:360] Out: 400 [Urine:400]   Physical Exam: General: Pt awake/alert/oriented x4 in no acute distress  Abdomen: Soft.  distended.  Diffusely tender without guarding.   No incarcerated hernias.    Problem List:   Principal Problem:   Rectal bleeding Active Problems:   Atrial fibrillation (HCC)   Abdominal pain   Hematochezia   Protein-calorie malnutrition, severe (HCC)   Essential hypertension   Acute blood loss anemia   Extranodal marginal zone B-cell lymphoma (HCC)   Adenomatous rectal polyp   Dysplastic rectal polyp    Results:   Labs: Results for orders placed or performed during the hospital encounter of 07/30/15 (from the past 48 hour(s))  Surgical PCR screen     Status: Abnormal   Collection Time: 08/03/15  1:10 AM  Result Value Ref Range   MRSA, PCR NEGATIVE NEGATIVE    Staphylococcus aureus POSITIVE (A) NEGATIVE    Comment:        The Xpert SA Assay (FDA approved for NASAL specimens in patients over 14 years of age), is one component of a comprehensive surveillance program.  Test performance has been validated by [facility] for patients greater than or equal to 66 year old. It is not intended to diagnose infection nor to guide or monitor treatment.   Comprehensive metabolic panel     Status: Abnormal   Collection Time: 08/03/15  5:23 AM  Result Value Ref Range   Sodium 135 135 - 145 mmol/L   Potassium 4.3 3.5 - 5.1 mmol/L   Chloride 105 101 - 111 mmol/L   CO2 24 22 - 32 mmol/L   Glucose, Bld 118 (H) 65 - 99 mg/dL   BUN 14 6 - 20 mg/dL   Creatinine, Ser 0.53 (L) 0.61 - 1.24 mg/dL   Calcium 8.7 (L) 8.9 - 10.3 mg/dL   Total Protein 6.0 (L) 6.5 - 8.1 g/dL   Albumin 3.0 (L) 3.5 - 5.0 g/dL   AST 18 15 - 41 U/L   ALT 17 17 - 63 U/L   Alkaline Phosphatase 84 38 - 126 U/L   Total Bilirubin 0.2 (L) 0.3 - 1.2 mg/dL  GFR calc non Af Amer >60 >60 mL/min   GFR calc Af Amer >60 >60 mL/min    Comment: (NOTE) The eGFR has been calculated using the CKD EPI equation. This calculation has not been validated in all clinical situations. eGFR's persistently <60 mL/min signify possible Chronic Kidney Disease.    Anion gap 6 5 - 15  CBC     Status: Abnormal   Collection Time: 08/03/15  5:23 AM  Result Value Ref Range   WBC 6.4 4.0 - 10.5 K/uL   RBC 3.16 (L) 4.22 - 5.81 MIL/uL   Hemoglobin 9.5 (L) 13.0 - 17.0 g/dL   HCT 30.2 (L) 39.0 - 52.0 %   MCV 95.6 78.0 - 100.0 fL   MCH 30.1 26.0 - 34.0 pg   MCHC 31.5 30.0 - 36.0 g/dL   RDW 15.4 11.5 - 15.5 %   Platelets 171 150 - 400 K/uL  Type and screen [place]     Status: None   Collection Time: 08/03/15  8:40 AM  Result Value Ref Range   ABO/RH(D) O POS    Antibody Screen NEG    Sample Expiration 08/06/2015   CBC     Status: Abnormal   Collection Time: 08/04/15  5:51 AM  Result  Value Ref Range   WBC 9.3 4.0 - 10.5 K/uL   RBC 3.39 (L) 4.22 - 5.81 MIL/uL   Hemoglobin 10.0 (L) 13.0 - 17.0 g/dL   HCT 32.3 (L) 39.0 - 52.0 %   MCV 95.3 78.0 - 100.0 fL   MCH 29.5 26.0 - 34.0 pg   MCHC 31.0 30.0 - 36.0 g/dL   RDW 15.6 (H) 11.5 - 15.5 %   Platelets 182 150 - 400 K/uL    Imaging / Studies: No results found.  Medications / Allergies:  Scheduled Meds: . carvedilol  6.25 mg Oral BID WC  . Chlorhexidine Gluconate Cloth  6 each Topical Daily  . feeding supplement  1 Container Oral TID BM  . ipratropium-albuterol  3 mL Nebulization TID  . mupirocin ointment  1 application Nasal BID  . nicotine  21 mg Transdermal Daily  . predniSONE  40 mg Oral Q breakfast  . sodium chloride flush  3 mL Intravenous Q12H   Continuous Infusions: . sodium chloride 50 mL/hr (08/04/15 0904)   PRN Meds:.acetaminophen **OR** acetaminophen, ipratropium-albuterol, LORazepam, morphine injection, ondansetron **OR** ondansetron (ZOFRAN) IV, oxyCODONE, zolpidem  Antibiotics: Anti-infectives    Start     Dose/Rate Route Frequency Ordered Stop   08/03/15 0800  [MAR Hold]  ertapenem (INVANZ) 1 g in sodium chloride 0.9 % 50 mL IVPB     (MAR Hold since 08/03/15 0831)   1 g 100 mL/hr over 30 Minutes Intravenous 30 min pre-op 08/02/15 1115 08/03/15 1005        Assessment/Plan Rectal polyp POD#1 transanal minimally invasive surgery to resect rectal polyp Flexible sigmoidoscopy---Dr. Marcello Goodwin -tolerating diet, h&h are stable -keep on Invanz today, CBC in AM, if stable then DC -nothing per rectum, otherwise, no surgical restrictions  Abdominal pain-reports it started after colonoscopy and is gradually worsening.  Will obtain a stat AXR to ensure to perforation.  Prednisone could certainly mask his symptoms.  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  i have strongly encouraged the patient to follow up oncology.  He was previously supposed to see Dr. Annamaria Goodwin.   [name], [facility] Pager [phone](7A-[phone])   08/04/2015 1:21 PM

## 2015-08-04 NOTE — Evaluation (Signed)
Physical Therapy Evaluation Patient Details Name: [name] MRN: [id number] DOB: 07-08-1945 Today's Date: 08/04/2015   History of Present Illness  [name] is an 70 y.o. male with previous history of subtotal colectomy and ileal rectal anastomoses admitted in May 2016 for rectal bleeding and flexible sigmoidoscopy noted tubulovillous adenoma as cause. Initially recommended for transanal resection the patient did not have this done for unclear etiology. Since then, patient has had intermittent rectal bleeding and then admitted on 07/27/14 with bloody bowel movements x several days at that time undergoing flexible sigmoidoscopy which again noted polyp felt to be cause. Patient left AMA on 07/29/15. He was readmitted on 07/30/15 with ongoing rectal bleeding. Surgery consulted with plan to take patient for polyp resection on Thursday 2/23.  Clinical Impression  Pt admitted with above diagnosis. Pt currently with functional limitations due to the deficits listed below (see PT Problem List). Pt will benefit from skilled PT to increase their independence and safety with mobility to allow discharge to the venue listed below.       Follow Up Recommendations SNF (??; multiple recent admissions)    Equipment Recommendations  Rolling walker with 5" wheels    Recommendations for Other Services       Precautions / Restrictions Precautions Precautions: Fall      Mobility  Bed Mobility Overal bed mobility: Modified Independent                Transfers Overall transfer level: Needs assistance Equipment used: Rolling walker (2 wheeled) Transfers: Sit to/from Stand Sit to Stand: Min guard         General transfer comment: to steady pt  Ambulation/Gait Ambulation/Gait assistance: Min guard;Min assist Ambulation Distance (Feet): 50 Feet Assistive device: Rolling walker (2 wheeled) Gait Pattern/deviations: Step-to pattern;Trunk flexed;Decreased weight shift to right;Decreased step  length - right;Decreased step length - left;Narrow base of support     General Gait Details: walks on R toes and  lateral edge of foot; cues for RW safety  Stairs            Wheelchair Mobility    Modified Rankin (Stroke Patients Only)       Balance             Standing balance-Leahy Scale: Fair                               Pertinent Vitals/Pain      Home Living Family/patient expects to be discharged to:: Unsure                 Additional Comments: pt reports he is going to [place]    Prior Function Level of Independence: Independent with assistive device(s)         Comments: pt states he uses a "walking stick"     Hand Dominance        Extremity/Trunk Assessment               Lower Extremity Assessment: Generalized weakness;RLE deficits/detail RLE Deficits / Details: limited df       Communication   Communication: No difficulties  Cognition Arousal/Alertness: Awake/alert Behavior During Therapy: WFL for tasks assessed/performed Overall Cognitive Status: No family/caregiver present to determine baseline cognitive functioning Area of Impairment: Safety/judgement;Problem solving         Safety/Judgement: Decreased awareness of safety   Problem Solving: Requires verbal cues General Comments: pt tends to confabulate--states he is going  to go to [place] and get married when he leaves the hospital; when asked how he would get there pt states he is going to use [place] "[place]"; pt also says he might go to the "brick house" he built with a "pool and a slide"    General Comments      Exercises        Assessment/Plan    PT Assessment Patient needs continued PT services  PT Diagnosis Difficulty walking;Abnormality of gait   PT Problem List Decreased mobility;Decreased balance;Decreased activity tolerance;Decreased cognition  PT Treatment Interventions Gait training;DME  instruction;Functional mobility training;Therapeutic activities;Patient/family education;Balance training;Therapeutic exercise   PT Goals (Current goals can be found in the Care Plan section) Acute Rehab PT Goals Patient Stated Goal: none stated PT Goal Formulation: With patient Time For Goal Achievement: 08/18/15 Potential to Achieve Goals: Fair    Frequency Min 3X/week   Barriers to discharge        Co-evaluation               End of Session Equipment Utilized During Treatment: Gait belt Activity Tolerance: Patient tolerated treatment well Patient left: with call bell/phone within reach;in bed;with bed alarm set;with nursing/sitter in room           Time: [id number] PT Time Calculation (min) (ACUTE ONLY): 14 min   Charges:   PT Evaluation $PT Eval Low Complexity: 1 Procedure     PT G Codes:        [name] 2015-08-08, 2:47 PM

## 2015-08-04 NOTE — Consult Note (Signed)
[facility] Consult   Reason for Consult:  Competency evaluation Referring Physician:  Dr. Rockne Menghini Patient Identification: [name] MRN:  [id number] Principal Diagnosis: Rectal bleeding Diagnosis:   Patient Active Problem List   Diagnosis Date Noted  . Dysplastic rectal polyp [K62.0]   . Adenomatous rectal polyp [D12.8]   . Chronic abdominal pain [R10.9, G89.29]   . 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 Pressure ulcer [L89.90] 11/16/2014  . Hx of colectomy [Z90.49]   . History of adenomatous polyp of colon [Z86.010]   . Rectal mass [R19.8]   . Essential hypertension [I10]   . Rectal bleeding [K62.5]   . Acute blood loss anemia [D62]   . Delirium [R41.0]   . Protein-calorie malnutrition, severe ([place]) [E43] 10/08/2014  . Hematochezia [K92.1]   . Sigmoid & cecal bleeding colon masses s/p abdominal colectomy 10/05/2014 [D12.5]   . HLD (hyperlipidemia) [E78.5] 09/30/2014  . Depression [F32.9] 09/30/2014  . Abdominal pain [R10.9] 09/30/2014  . BPH (benign prostatic hyperplasia) [N40.0] 09/29/2014  . Thrombocytopenia ([place]) [D69.6] 12/24/2006  . DEPRESSION, MAJOR, MODERATE [F32.1] 12/24/2006  . LEARNING DISABILITY [F81.89] 12/24/2006  . 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 CARDIOMYOPATHY [I42.8] 12/24/2006  . 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 ATHEROSCLEROSIS, AORTIC [I70.0] 12/24/2006  . Atrial fibrillation ([place]) [I48.91] 12/24/2006  . History of other specified conditions presenting hazards to health [Z91.89] 12/24/2006  . CHOLECYSTECTOMY, HX OF [Z90.89] 12/24/2006    Total Time spent with patient: 1 hour  Subjective:   [name] is a 70 y.o. male patient admitted with rectal bleeding.  HPI:  [name] is a 70 y.o. male seen for face-to-face psychiatry consultation and evaluation of competency along with LCSW. Patient reportedly came to [name] in the ambulance for rectal bleeding and underwent  flexible sigmoidoscopy and found polyp which need to be removed. Patient also reported he has been living in a shed in [place]. Patient reportedly being a brick home in [place] and has plans of getting married with his girlfriend who is 20 years old currently lives with her mother in [place]. Patient has been disabled over 30 years and his previous wife was disabled and being in a nursing home. Patient reportedly has no children. Patient reportedly worked as a Engineer, building services in the past. Patient reportedly did not go to school well and cannot read or spell long words. Patient is oriented to name, place, person's and has immediate memory but he calling 3 objects given to him but has difficulty recalling ic and after 2 minutes. Patient repeated when the 2 out of the 3 words given to him. Patient has a language functions but no abstract thinking or unable to work with the proverbs given to him. Patient stated he never heard about such a common proverb's. Patient understand his medical problems and needed medical interventions and reportedly has his own cardiologist and primary care physician in the community.   Past Psychiatric History: No history of mental health problems or never been admitted to psychiatric Goodwin.   Risk to Self: Is patient at risk for suicide?: No Risk to Others:   Prior Inpatient Therapy:   Prior Outpatient Therapy:    Past Medical History:  Past Medical History  Diagnosis Date  . Depression   . Fall at nursing home     December 2014  . History of migraine   . Gout attack   . 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 Lovena Le  . Chronic atrial fibrillation ([place])   . HLD (hyperlipidemia)   . GIB (gastrointestinal bleeding)   . BPH (benign prostatic hyperplasia)   . Essential hypertension   . Gastric ulcer   . Non Hodgkin's lymphoma ([place])   . Tubular adenoma of colon   . Aspiration pneumonia ([place]) 10/07/2014  . ATHEROSCLEROSIS, AORTIC  12/24/2006    Qualifier: Diagnosis of  By: Hilma Favors  DO, Beth    . Atypical angina ([place]) 12/01/2014  . BLOCK, AV, COMPLETE 12/24/2006    Annotation: Pacer placed Qualifier: Diagnosis of  By: Hilma Favors  DO, Beth    . 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 Fracture femur, cervicotrochanteric ([place]) 05/27/2013    Past Surgical History  Procedure Laterality Date  . Pacemaker generator change      Dr. Lovena Le  . Appendectomy    . Cholecystectomy    . Open reduction of hip Right 05/28/2013    Procedure: OPEN REDUCTION OF HIP;  Surgeon: Renette Butters, MD;  Location: [facility];  Service: Orthopedics;  Laterality: Right;  . Esophagogastroduodenoscopy (egd) with propofol N/A 09/26/2014    Procedure: ESOPHAGOGASTRODUODENOSCOPY (EGD) WITH PROPOFOL;  Surgeon: Jerene Bears, MD;  Location[facility] ENDOSCOPY;  Service: Endoscopy;  Laterality: N/A;  . Colonoscopy N/A 10/01/2014    Procedure: COLONOSCOPY;  Surgeon: Jerene Bears, MD;  Location: [facility] ENDOSCOPY;  Service: Endoscopy;  Laterality: N/A;  . Partial colectomy N/A 10/05/2014    Procedure: SUBTOTAL COLECTOMY WITH ILEORECTAL ANASTOMOSIS;  Surgeon: Donnie Mesa, MD;  Location: [facility];  Service: General;  Laterality: N/A;  . Flexible sigmoidoscopy N/A 11/07/2014    Procedure: FLEXIBLE SIGMOIDOSCOPY;  Surgeon: Jerene Bears, MD;  Location: [facility]COPY;  Service: Endoscopy;  Laterality: N/A;  . Gastric resection    . Flexible sigmoidoscopy N/A 07/30/2015    Procedure: FLEXIBLE SIGMOIDOSCOPY;  Surgeon: Doran Stabler, MD;  Location: [facility] ENDOSCOPY;  Service: Endoscopy;  Laterality: N/A;   Family History:  Family History  Problem Relation Age of Onset  . Colon cancer Mother    Family Psychiatric  History: denied  Social History:  History  Alcohol Use  . 0.0 oz/week  . 0 Standard drinks or equivalent per week    Comment: "I drink a fifth of bourbon a month"     History   Drug Use  . Yes  . Special: Marijuana    Comment: "a joint a month"    Social History   Social History  . Marital Status: Divorced    Spouse Name: N/A  . Number of Children: N/A  . Years of Education: N/A   Social History Main Topics  . Smoking status: Current Every Day Smoker -- 0.50 packs/day for 66 years    Types: Cigarettes  . Smokeless tobacco: Former Systems developer    Types: Snuff, Chew     Comment: 05/27/2013 "haven't used chew or snuff in ~ 30 yr"  . Alcohol Use: 0.0 oz/week    0 Standard drinks or equivalent per week     Comment: "I drink a fifth of bourbon a month"  . Drug Use: Yes    Special: Marijuana     Comment: "a joint a month"  . Sexual Activity: No   Other Topics Concern  . None   Social History Narrative   Additional Social History:  Allergies:  No Known Allergies  Labs:  Results for orders placed or performed during the Goodwin encounter of 07/30/15 (from the past 48 hour(s))  Comprehensive metabolic panel     Status: Abnormal   Collection Time: 08/02/15 11:09 AM  Result Value Ref Range   Sodium 135 135 - 145 mmol/L   Potassium 4.4 3.5 - 5.1 mmol/L   Chloride 104 101 - 111 mmol/L   CO2 24 22 - 32 mmol/L   Glucose, Bld 96 65 - 99 mg/dL   BUN 11 6 - 20 mg/dL   Creatinine, Ser 0.68 0.61 - 1.24 mg/dL   Calcium 8.6 (L) 8.9 - 10.3 mg/dL   Total Protein 6.2 (L) 6.5 - 8.1 g/dL   Albumin 3.0 (L) 3.5 - 5.0 g/dL   AST 12 (L) 15 - 41 U/L   ALT 14 (L) 17 - 63 U/L   Alkaline Phosphatase 87 38 - 126 U/L   Total Bilirubin 0.2 (L) 0.3 - 1.2 mg/dL   GFR calc non Af Amer >60 >60 mL/min   GFR calc Af Amer >60 >60 mL/min    Comment: (NOTE) The eGFR has been calculated using the CKD EPI equation. This calculation has not been validated in all clinical situations. eGFR's persistently <60 mL/min signify possible Chronic Kidney Disease.    Anion gap 7 5 - 15  Lipase, blood     Status: None   Collection Time: 08/02/15 11:09 AM  Result Value Ref Range   Lipase  41 11 - 51 U/L  Surgical PCR screen     Status: Abnormal   Collection Time: 08/03/15  1:10 AM  Result Value Ref Range   MRSA, PCR NEGATIVE NEGATIVE   Staphylococcus aureus POSITIVE (A) NEGATIVE    Comment:        The Xpert SA Assay (FDA approved for NASAL specimens in patients over 55 years of age), is one component of a comprehensive surveillance program.  Test performance has been validated by [facility] for patients greater than or equal to 4 year old. It is not intended to diagnose infection nor to guide or monitor treatment.   Comprehensive metabolic panel     Status: Abnormal   Collection Time: 08/03/15  5:23 AM  Result Value Ref Range   Sodium 135 135 - 145 mmol/L   Potassium 4.3 3.5 - 5.1 mmol/L   Chloride 105 101 - 111 mmol/L   CO2 24 22 - 32 mmol/L   Glucose, Bld 118 (H) 65 - 99 mg/dL   BUN 14 6 - 20 mg/dL   Creatinine, Ser 0.53 (L) 0.61 - 1.24 mg/dL   Calcium 8.7 (L) 8.9 - 10.3 mg/dL   Total Protein 6.0 (L) 6.5 - 8.1 g/dL   Albumin 3.0 (L) 3.5 - 5.0 g/dL   AST 18 15 - 41 U/L   ALT 17 17 - 63 U/L   Alkaline Phosphatase 84 38 - 126 U/L   Total Bilirubin 0.2 (L) 0.3 - 1.2 mg/dL   GFR calc non Af Amer >60 >60 mL/min   GFR calc Af Amer >60 >60 mL/min    Comment: (NOTE) The eGFR has been calculated using the CKD EPI equation. This calculation has not been validated in all clinical situations. eGFR's persistently <60 mL/min signify possible Chronic Kidney Disease.    Anion gap 6 5 - 15  CBC     Status: Abnormal   Collection Time: 08/03/15  5:23 AM  Result Value Ref Range   WBC 6.4 4.0 -  10.5 K/uL   RBC 3.16 (L) 4.22 - 5.81 MIL/uL   Hemoglobin 9.5 (L) 13.0 - 17.0 g/dL   HCT 30.2 (L) 39.0 - 52.0 %   MCV 95.6 78.0 - 100.0 fL   MCH 30.1 26.0 - 34.0 pg   MCHC 31.5 30.0 - 36.0 g/dL   RDW 15.4 11.5 - 15.5 %   Platelets 171 150 - 400 K/uL  Type and screen [place]     Status: None   Collection Time: 08/03/15  8:40 AM  Result Value Ref  Range   ABO/RH(D) O POS    Antibody Screen NEG    Sample Expiration 08/06/2015   CBC     Status: Abnormal   Collection Time: 08/04/15  5:51 AM  Result Value Ref Range   WBC 9.3 4.0 - 10.5 K/uL   RBC 3.39 (L) 4.22 - 5.81 MIL/uL   Hemoglobin 10.0 (L) 13.0 - 17.0 g/dL   HCT 32.3 (L) 39.0 - 52.0 %   MCV 95.3 78.0 - 100.0 fL   MCH 29.5 26.0 - 34.0 pg   MCHC 31.0 30.0 - 36.0 g/dL   RDW 15.6 (H) 11.5 - 15.5 %   Platelets 182 150 - 400 K/uL    Current Facility-Administered Medications  Medication Dose Route Frequency Provider Last Rate Last Dose  . 0.9 %  sodium chloride infusion   Intravenous Continuous [name], MD 50 mL/hr at 08/04/15 0904 50 mL/hr at 08/04/15 0904  . acetaminophen (TYLENOL) tablet 650 mg  650 mg Oral Q6H PRN Norval Morton, MD   650 mg at 08/04/15 2924   Or  . acetaminophen (TYLENOL) suppository 650 mg  650 mg Rectal Q6H PRN Norval Morton, MD      . carvedilol (COREG) tablet 6.25 mg  6.25 mg Oral BID WC Norval Morton, MD   6.25 mg at 08/04/15 0935  . Chlorhexidine Gluconate Cloth 2 % PADS 6 each  6 each Topical Daily Reyne Dumas, MD      . feeding supplement (BOOST / RESOURCE BREEZE) liquid 1 Container  1 Container Oral TID BM Erby Pian, NP   1 Container at 08/04/15 0934  . ipratropium-albuterol (DUONEB) 0.5-2.5 (3) MG/3ML nebulizer solution 3 mL  3 mL Nebulization Q6H PRN Annita Brod, MD   3 mL at 08/04/15 0301  . ipratropium-albuterol (DUONEB) 0.5-2.5 (3) MG/3ML nebulizer solution 3 mL  3 mL Nebulization TID Annita Brod, MD   3 mL at 08/04/15 0819  . LORazepam (ATIVAN) injection 1 mg  1 mg Intravenous Q4H PRN Annita Brod, MD   1 mg at 08/02/15 1730  . morphine 2 MG/ML injection 1 mg  1 mg Intravenous Q2H PRN Norval Morton, MD   1 mg at 08/04/15 1013  . mupirocin ointment (BACTROBAN) 2 % 1 application  1 application Nasal BID Reyne Dumas, MD   1 application at 46/28/63 2306  . nicotine (NICODERM CQ - dosed in mg/24 hours) patch 21 mg   21 mg Transdermal Daily Norval Morton, MD   21 mg at 08/04/15 0935  . ondansetron (ZOFRAN) tablet 4 mg  4 mg Oral Q6H PRN Norval Morton, MD       Or  . ondansetron (ZOFRAN) injection 4 mg  4 mg Intravenous Q6H PRN Norval Morton, MD   4 mg at 08/03/15 1125  . oxyCODONE (Oxy IR/ROXICODONE) immediate release tablet 5 mg  5 mg Oral Q6H PRN Reyne Dumas, MD  5 mg at 08/04/15 0839  . predniSONE (DELTASONE) tablet 40 mg  40 mg Oral Q breakfast Venetia Maxon Rama, MD   40 mg at 08/04/15 0839  . sodium chloride flush (NS) 0.9 % injection 3 mL  3 mL Intravenous Q12H Rondell A Tamala Julian, MD   3 mL at 08/03/15 2306  . zolpidem (AMBIEN) tablet 5 mg  5 mg Oral QHS PRN Norval Morton, MD        Musculoskeletal: Strength & Muscle Tone: decreased Gait & Station: unable to stand Patient leans: N/A  Psychiatric Specialty Exam: ROS generalized weakness, rectal bleeding and atrial fibrillation. Denied current nausea, vomiting, diarrhea, abdominal pain, shortness of breath and chest pain No Fever-chills, No Headache, No changes with Vision or hearing, reports vertigo No problems swallowing food or Liquids, No Chest pain, Cough or Shortness of Breath, No Abdominal pain, No Nausea or Vommitting, Bowel movements are regular, No Blood in stool or Urine, No dysuria, No new skin rashes or bruises, No new joints pains-aches,  No new weakness, tingling, numbness in any extremity, No recent weight gain or loss, No polyuria, polydypsia or polyphagia,   A full 10 point Review of Systems was done, except as stated above, all other Review of Systems were negative.  Blood pressure 135/68, pulse 80, temperature 98.6 F (37 C), temperature source Oral, resp. rate 18, height _0  (1.753 m), weight 48.8 kg (107 lb 9.4 oz), SpO2 98 %.Body mass index is 15.88 kg/(m^2).  General Appearance: Casual  Eye Contact::  Good  Speech:  Clear and Coherent  Volume:  Normal  Mood:  Euthymic  Affect:  Appropriate and Congruent   Thought Process:  Coherent and Goal Directed  Orientation:  Full (Time, Place, and Person)  Thought Content:  WDL  Suicidal Thoughts:  No  Homicidal Thoughts:  No  Memory:  Immediate;   Good Recent;   Fair  Judgement:  Intact  Insight:  Fair  Psychomotor Activity:  Decreased  Concentration:  Good  Recall:  Good  Fund of Knowledge:Good  Language: Good  Akathisia:  Negative  Handed:  Right  AIMS (if indicated):     Assets:  Communication Skills Desire for Improvement Financial Resources/Insurance Leisure Time Resilience Transportation  ADL's:  Intact  Cognition: Impaired,  Mild  Sleep:      Treatment Plan Summary: Case discussed with LCSW and Dr. Rockne Menghini Based on my evaluation today patient meets criteria for competent to make his own medical decisions and living arrangements. Refer to the unit social worker for appropriate safe disposition plans Appreciate psychiatric consultation and we sign off at this time Please contact [phone] or [phone] if needs further assistance   Disposition: Patient may be discharged to safe disposition plan when medically stable Patient does not meet criteria for psychiatric inpatient admission. Supportive therapy provided about ongoing stressors.  Durward Parcel., MD 08/04/2015 10:26 AM

## 2015-08-04 NOTE — Clinical Social Work Note (Signed)
Client is his own guardian and it is listed incorrectly in our system that adult enrichment is his guardian.  CSW spoke with Adult [facility] to confirm.  Dede Query, LCSW [place] Worker - Weekend Coverage cell #: [phone]

## 2015-08-04 NOTE — Progress Notes (Signed)
Progress Note   MAYCON GRAP [id number] DOB: 07-Nov-1945 DOA: 07/30/2015 PCP: [name], MD   Brief Narrative:   [name] is an 70 y.o. male with previous history of subtotal colectomy and ileal rectal anastomoses admitted in May 2016 for rectal bleeding and flexible sigmoidoscopy noted tubulovillous adenoma as cause. Initially recommended for transanal resection the patient did not have this done for unclear etiology. Since then, patient has had intermittent rectal bleeding and then admitted on 07/27/14 with bloody bowel movements x several days at that time undergoing flexible sigmoidoscopy which again noted polyp felt to be cause. Patient left AMA on 07/29/15. He was readmitted on 07/30/15 with ongoing rectal bleeding. Surgery consulted with plan to take patient for polyp resection on Thursday 2/23.  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 - On stool softeners. - Hemoglobin stable.  Active Problems:   Competency issues - Psychiatric evaluation done 08/04/15: found to be competent. - Homeless. Stays in a neighbor's shed.    Atrial fibrillation [facility]) s/p pacemaker - Continue rate control efforts. Heart rate 60s-90s. - Has not been a candidate for blood thinners secondary to compliance issues.    Abdominal pain - CT scan 07/16/15 showed significant stool within the distal colon, possible impaction. - Continue constipation regimen and repeat KUB if persistent. - Had some ongoing abdominal/rectal pain today, will watch overnight.    Protein-calorie malnutrition, severe ([place]) - Patient meets criteria for severe malnutrition in the context of social or environmental circumstances plus underweight. - Seen by nutrition. Body mass index is 15.88 kg/(m^2). - Now on regular diet. Continue nutritional supplements.   Essential hypertension, controlled - Controlled on Coreg.    COPD/Shortness of breath - He appears to be on prednisone 60 mg daily, but I am unable to find the details as to when this was started or why. - Mild wheezing noted today, will likely need a slow steroid taper.    Low-grade non-Hodgkin's B-cell lymphoma, chronic/stable - Follow-up with oncology. Not felt to be a good candidate for treatment in the past.    DVT Prophylaxis - SCDs ordered.   Family Communication/Anticipated D/C date and plan/Code Status   Family Communication: No family.  Says he is getting married to a woman in [place]. Disposition Plan: Home when stable. Anticipated D/C date:   08/05/15 if abdominal pain improves and hemoglobin does not drop. Code Status: Full code.   IV Access:    Peripheral IV   Procedures and diagnostic studies:   Dg Chest 2 View  07/30/2015  CLINICAL DATA:  SOB, cough. H/o complete heart block, chronic a-fib, non-Hodgkin's lymphoma. Pt c/o bleeding from the rectum. Smoker x 66 years. EXAM: CHEST  2 VIEW COMPARISON:  07/28/2015 FINDINGS: Lungs are mildly hyperinflated. Heart size is normal. Right-sided transvenous pacemaker leads overlie the right atrium and right ventricle. There is minimal density at the left lung base cava most consistent with atelectasis. There are no focal consolidations. No pleural effusions or pulmonary edema. IMPRESSION: 1. Hyperinflation. 2. Minimal left lower lobe atelectasis. Electronically Signed   By: Nolon Nations M.D.   On: 07/30/2015 18:19     Medical Consultants:    Surgery: Dr. [name]  Anti-Infectives:   Anti-infectives    Start     Dose/Rate Route Frequency Ordered Stop   08/03/15 0800  [MAR Hold]  ertapenem (INVANZ) 1 g in sodium chloride 0.9 % 50 mL IVPB     (MAR Hold since 08/03/15 0831)   1 g 100 mL/hr over 30 Minutes Intravenous 30 min pre-op 08/02/15 1115 08/03/15 1005      Subjective:   [name] is having some  discomfort in the lower abdomen/rectal area where polyp was removed.  No chest pain. Some shortness of breath, and cough with yellow sputum.  Oozing some blood from rectum.  Appetite OK.  Bowels are moving frequently.  Objective:    Filed Vitals:   08/03/15 2230 08/04/15 0301 08/04/15 0447 08/04/15 0820  BP: 135/53  152/78   Pulse: 68  97 75  Temp: 98.6 F (37 C)  98.4 F (36.9 C)   TempSrc: Oral  Oral   Resp: 16  20 20   Height:      Weight:      SpO2: 96% 94% 97% 98%    Intake/Output Summary (Last 24 hours) at 08/04/15 0848 Last data filed at 08/03/15 1148  Gross per 24 hour  Intake   1150 ml  Output    200 ml  Net    950 ml   Filed Weights   07/31/15 0828  Weight: 48.8 kg (107 lb 9.4 oz)    Exam: Gen:  NAD, thin Cardiovascular:  RRR, No M/R/G Respiratory:  Lungs with a few expiratory wheezes Gastrointestinal:  Abdomen soft, NT/ND, + BS Extremities:  No C/E/C   Data Reviewed:    Labs: Basic Metabolic Panel:  Recent Labs Lab 07/30/15 0618 07/30/15 1827 07/31/15 0435 08/02/15 1109 08/03/15 0523  NA 135 140 140 135 135  K 4.4 5.2* 4.3 4.4 4.3  CL 109 108 110 104 105  CO2 21* 25 24 24 24   GLUCOSE 107* 99 110* 96 118*  BUN 12 20 22* 11 14  CREATININE 0.68 1.13 0.78 0.68 0.53*  CALCIUM 8.4* 9.2 8.9 8.6* 8.7*   GFR Estimated Creatinine Clearance: 61 mL/min (by C-G formula based on Cr of 0.53). Liver Function Tests:  Recent Labs Lab 07/29/15 0136 07/30/15 1827 08/02/15 1109 08/03/15 0523  AST 19 19 12* 18  ALT 17 17 14* 17  ALKPHOS 102 118 87 84  BILITOT 0.5 0.4 0.2* 0.2*  PROT 6.6 7.5 6.2* 6.0*  ALBUMIN 3.2* 3.7 3.0* 3.0*    Recent Labs Lab 08/02/15 1109  LIPASE 41   Coagulation profile  Recent Labs Lab 07/31/15 0435  INR 1.10    CBC:  Recent Labs Lab 07/30/15 0618 07/30/15 1827 07/31/15 0435 08/01/15 0607 08/03/15 0523 08/04/15 0551  WBC 5.8 7.8 4.3  --  6.4 9.3  HGB 10.0* 11.1* 10.3* 9.3* 9.5* 10.0*  HCT 31.9* 35.7*  33.3* 30.4* 30.2* 32.3*  MCV 94.4 94.7 95.4  --  95.6 95.3  PLT 165 206 159  --  171 182   Cardiac Enzymes:  Recent Labs Lab 07/28/15 2341  TROPONINI <0.03   Microbiology Recent Results (from the past 240 hour(s))  MRSA PCR Screening     Status: None   Collection Time: 07/31/15  8:40 AM  Result Value Ref Range Status   MRSA by PCR NEGATIVE NEGATIVE Final    Comment:        The GeneXpert MRSA Assay (FDA approved for NASAL specimens only), is one component of a comprehensive MRSA colonization surveillance program. It is not intended to diagnose MRSA infection nor to guide or monitor treatment for MRSA infections.   Surgical PCR screen  Status: Abnormal   Collection Time: 08/03/15  1:10 AM  Result Value Ref Range Status   MRSA, PCR NEGATIVE NEGATIVE Final   Staphylococcus aureus POSITIVE (A) NEGATIVE Final    Comment:        The Xpert SA Assay (FDA approved for NASAL specimens in patients over 80 years of age), is one component of a comprehensive surveillance program.  Test performance has been validated by [facility] for patients greater than or equal to 49 year old. It is not intended to diagnose infection nor to guide or monitor treatment.      Medications:   . carvedilol  6.25 mg Oral BID WC  . Chlorhexidine Gluconate Cloth  6 each Topical Daily  . feeding supplement  1 Container Oral TID BM  . ipratropium-albuterol  3 mL Nebulization TID  . mupirocin ointment  1 application Nasal BID  . nicotine  21 mg Transdermal Daily  . predniSONE  40 mg Oral Q breakfast  . sodium chloride flush  3 mL Intravenous Q12H   Continuous Infusions: . sodium chloride 50 mL/hr at 08/03/15 1258    Time spent: 25 minutes   LOS: 5 days   Ezekeil Bethel  [facility] Pager [phone]. If unable to reach me by pager, please call my cell phone at [phone].  *Please refer to [email or web address], password [email or web address] to get updated schedule on who will round on this patient, as  hospitalists switch teams weekly. If 7PM-7AM, please contact night-coverage at [email or web address], password [email or web address] for any overnight needs.  08/04/2015, 8:48 AM

## 2015-08-04 NOTE — Progress Notes (Signed)
Nutrition Follow-up  DOCUMENTATION CODES:   Severe malnutrition in context of social or environmental circumstances, Underweight  INTERVENTION:  - Will d/c Boost Breeze now that diet is advanced and order Ensure Enlive BID, each supplement provides 350 kcal and 20 grams of protein - RD will continue to monitor for needs  NUTRITION DIAGNOSIS:   Inadequate oral intake related to inability to eat as evidenced by NPO status. -resolved with diet advancement  GOAL:   Patient will meet greater than or equal to 90% of their needs -likely meeting  MONITOR:   PO intake, Supplement acceptance, Weight trends, Labs, Skin, I & O's  ASSESSMENT:   70 y.o. 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 Patient was admitted initially on 07/28/2015 for his symptoms of rectal bleeding. Earlier today the patient underwent a flexible sigmoidoscopy and was found to have a oblong sessile polyp approximately 15-20 millimeters with Dr. Loletha Carrow of gastroenterology . However, patient left AMA citing wanting to go out for a smokeand therefore signed papers to be discharged . 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 Patient notes that he still having abdominal pain as achy in nature and rectal bleeding have not been resolved.  2/24 Pt is POD #1 transanal excision of rectal polyps. Diet advancement as follows: 2/20 @ [id number]: CLD 2/23 @ 0001: NPO 2/23 @ [id number]: CLD 2/23 @ 1350: FLD 2/23 @ 1815: Regular  Per chart review, pt consumed 100% of breakfast 2/21, 50% of breakfast  2/22, and 100% of breakfast this AM. Pt currently on BSC and unable to talk with RD. Per surgery note, pt denies N/V but complains of some ongoing abdominal pain since colonoscopy. Psychiatrist saw pt today and states pt does not meet criteria for [facility] admission.   Pt has Boost Breeze TID from when he was on CLD. Will switch to Ensure Enlive now that diet is advanced due to increased protein provision. Nutrition needs as outlined below remain appropriate; noted pt with non-Hodgkins lymphoma.   Likely to meet needs with meals and supplements. Medications reviewed. Labs reviewed; creatinine low, Ca: 8.7 mg/dL.    2/20 - Pt is NPO and unable to meet needs at this time.  - Pt reports good appetite PTA and that he was eating consistent meals, unsure of reliability of this information.  - He does not have any teeth and states that he threw his dentures in a pond.  - Pt states that he has no difficulty chewing any items, including meat and raw fruits and vegetables, despite having no teeth or dentures.  - Pt reports that over the past 3 months he has been losing weight.  - He states weight 3 months ago was 104 lbs.  - Per chart review, pt has been gaining weight since July 2016.  - Severe muscle and fat wasting noted to upper body; did not assess lower body at this time.   Diet Order:  Diet regular Room service appropriate?: Yes; Fluid consistency:: Thin  Skin:  Wound (see comment) (Stage 1 bilateral buttocks pressure ulcers, Rectal incision from 08/03/15)  Last BM:  2/24  Height:  Ht Readings from Last 1 Encounters:  07/31/15 5\' 9"  (1.753 m)    Weight:   Wt Readings from Last 1 Encounters:  07/31/15 107 lb 9.4 oz (48.8 kg)    Ideal Body Weight:  72.73 kg (kg)  BMI:  Body mass index is 15.88 kg/(m^2).  Estimated Nutritional Needs:   Kcal:  [id number] (30-35 kcal/kg)  Protein:  55-65 grams  Fluid:  1.7-2 L/day  EDUCATION NEEDS:   No education needs identified at this  time     Jarome Matin, RD, LDN Inpatient Clinical Dietitian Pager # [phone] After hours/weekend pager # [phone]

## 2015-08-05 ENCOUNTER — Inpatient Hospital Stay (HOSPITAL_COMMUNITY): Payer: Medicare Other

## 2015-08-05 DIAGNOSIS — J441 Chronic obstructive pulmonary disease with (acute) exacerbation: Secondary | ICD-10-CM

## 2015-08-05 DIAGNOSIS — K567 Ileus, unspecified: Secondary | ICD-10-CM | POA: Diagnosis present

## 2015-08-05 DIAGNOSIS — Z590 Homelessness: Secondary | ICD-10-CM

## 2015-08-05 DIAGNOSIS — K9189 Other postprocedural complications and disorders of digestive system: Secondary | ICD-10-CM

## 2015-08-05 DIAGNOSIS — K913 Postprocedural intestinal obstruction: Secondary | ICD-10-CM

## 2015-08-05 DIAGNOSIS — R079 Chest pain, unspecified: Secondary | ICD-10-CM

## 2015-08-05 LAB — CBC
HEMATOCRIT: 32.7 % — AB (ref 39.0–52.0)
Hemoglobin: 10.3 g/dL — ABNORMAL LOW (ref 13.0–17.0)
MCH: 29.9 pg (ref 26.0–34.0)
MCHC: 31.5 g/dL (ref 30.0–36.0)
MCV: 94.8 fL (ref 78.0–100.0)
Platelets: 189 10*3/uL (ref 150–400)
RBC: 3.45 MIL/uL — ABNORMAL LOW (ref 4.22–5.81)
RDW: 15.5 % (ref 11.5–15.5)
WBC: 10.8 10*3/uL — AB (ref 4.0–10.5)

## 2015-08-05 LAB — TROPONIN I
Troponin I: <0.03 ng/mL (ref <0.031)
Troponin I: <0.03 ng/mL (ref <0.031)

## 2015-08-05 MED ORDER — MORPHINE SULFATE (PF) 4 MG/ML IV SOLN
4.0000 mg | Freq: Once | INTRAVENOUS | Status: AC
  Administered 2015-08-05: 4 mg via INTRAVENOUS
  Filled 2015-08-05: qty 1

## 2015-08-05 MED ORDER — PREDNISONE 20 MG PO TABS
20.0000 mg | ORAL_TABLET | Freq: Every day | ORAL | Status: DC
  Administered 2015-08-06: 20 mg via ORAL
  Filled 2015-08-05 (×3): qty 1

## 2015-08-05 MED ORDER — IPRATROPIUM-ALBUTEROL 0.5-2.5 (3) MG/3ML IN SOLN
3.0000 mL | Freq: Two times a day (BID) | RESPIRATORY_TRACT | Status: DC
  Administered 2015-08-06 – 2015-08-07 (×3): 3 mL via RESPIRATORY_TRACT
  Filled 2015-08-05 (×3): qty 3

## 2015-08-05 NOTE — Progress Notes (Signed)
Progress Note   [name] [id number] DOB: 08-Oct-1945 DOA: 07/30/2015 PCP: [name], MD   Brief Narrative:   [name] is an 70 y.o. male with previous history of subtotal colectomy and ileal rectal anastomoses admitted in May 2016 for rectal bleeding and flexible sigmoidoscopy noted tubulovillous adenoma as cause. Initially recommended for transanal resection the patient did not have this done for unclear etiology. Since then, patient has had intermittent rectal bleeding and then admitted on 07/27/14 with bloody bowel movements x several days at that time undergoing flexible sigmoidoscopy which again noted polyp felt to be cause. Patient left AMA on 07/29/15. He was readmitted on 07/30/15 with ongoing rectal bleeding. Surgery consulted with plan to take patient for polyp resection on Thursday 2/23.  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 - On stool softeners. - Hemoglobin stable.  Active Problems:   Abdominal pain, probable ileus - CT scan 07/16/15 showed significant stool within the distal colon, possible impaction. - Repeat KUB showed mild ileus, no obstruction or free air. - Treat symptomatically. - Clear liquids.    Chest pain - Complained of chest pain on the morning of 08/05/15. EKG showed A. fib. VSS. - Cycle troponins. 1st set negative.    Competency issues - Psychiatric evaluation done 08/04/15: found to be competent. - Homeless. Stays in a neighbor's shed.    Atrial fibrillation [facility]) s/p pacemaker - Continue rate control efforts. Heart rate 60s-90s. - Has not been a candidate for blood thinners secondary to compliance issues.    Protein-calorie malnutrition, severe ([place]) - Patient meets criteria for severe malnutrition in the context of social or environmental circumstances plus underweight. -  Seen by nutrition. Body mass index is 15.88 kg/(m^2). - Continue nutritional supplements.    Essential hypertension, controlled - Controlled on Coreg.    COPD/Shortness of breath - He appears to be on prednisone 60 mg daily, but I am unable to find the details as to when this was started or why. - Wean prednisone to 20 mg daily.    Low-grade non-Hodgkin's B-cell lymphoma, chronic/stable - Follow-up with oncology. Not felt to be a good candidate for treatment in the past.    DVT Prophylaxis - SCDs ordered.   Family Communication/Anticipated D/C date and plan/Code Status   Family Communication: No family.  Says he is getting married to a woman in [place]. Disposition Plan/date:   08/06/15 if abdominal pain improves and hemoglobin does not drop. Code Status: Full code.   IV Access:    Peripheral IV   Procedures and diagnostic studies:   Dg Chest 2 View  07/30/2015  CLINICAL DATA:  SOB, cough. H/o complete heart block, chronic a-fib, non-Hodgkin's lymphoma. Pt c/o bleeding from the rectum. Smoker x 66 years. EXAM: CHEST  2 VIEW COMPARISON:  07/28/2015 FINDINGS: Lungs are mildly hyperinflated. Heart size is normal. Right-sided transvenous pacemaker leads overlie the right atrium and right ventricle. There is minimal density at the left lung base cava most consistent with atelectasis. There are no focal consolidations. No pleural effusions or pulmonary edema. IMPRESSION: 1. Hyperinflation. 2. Minimal left lower lobe atelectasis. Electronically Signed   By: Nolon Nations M.D.   On: 07/30/2015 18:19     Medical Consultants:    Surgery: Dr. [name]  Anti-Infectives:   Anti-infectives    Start  Dose/Rate Route Frequency Ordered Stop   08/04/15 1400  ertapenem (INVANZ) 1 g in sodium chloride 0.9 % 50 mL IVPB     1 g 100 mL/hr over 30 Minutes Intravenous Every 24 hours 08/04/15 1319 08/04/15 1516   08/03/15 0800  [MAR Hold]  ertapenem (INVANZ) 1 g in sodium chloride 0.9  % 50 mL IVPB     (MAR Hold since 08/03/15 0831)   1 g 100 mL/hr over 30 Minutes Intravenous 30 min pre-op 08/02/15 1115 08/03/15 1005      Subjective:   [name] has had complaints of chest pain and lower abdominal pain.  No N/V.  Wants to eat.  Objective:    Filed Vitals:   08/04/15 1425 08/04/15 1953 08/04/15 2111 08/05/15 0526  BP: 148/53  132/76 152/70  Pulse: 110  73 85  Temp: 98 F (36.7 C)  98 F (36.7 C) 98.5 F (36.9 C)  TempSrc: Oral  Oral Oral  Resp: 20  20 18   Height:      Weight:      SpO2: 97% 96% 95% 97%    Intake/Output Summary (Last 24 hours) at 08/05/15 0843 Last data filed at 08/05/15 0550  Gross per 24 hour  Intake 2053.7 ml  Output    975 ml  Net 1078.7 ml   Filed Weights   07/31/15 0828  Weight: 48.8 kg (107 lb 9.4 oz)    Exam: Gen:  NAD, thin Cardiovascular:  RRR, No M/R/G Respiratory:  Lungs CTAB Gastrointestinal:  Abdomen soft, NT/ND, + BS Extremities:  No C/E/C   Data Reviewed:    Labs: Basic Metabolic Panel:  Recent Labs Lab 07/30/15 0618 07/30/15 1827 07/31/15 0435 08/02/15 1109 08/03/15 0523  NA 135 140 140 135 135  K 4.4 5.2* 4.3 4.4 4.3  CL 109 108 110 104 105  CO2 21* 25 24 24 24   GLUCOSE 107* 99 110* 96 118*  BUN 12 20 22* 11 14  CREATININE 0.68 1.13 0.78 0.68 0.53*  CALCIUM 8.4* 9.2 8.9 8.6* 8.7*   GFR Estimated Creatinine Clearance: 61 mL/min (by C-G formula based on Cr of 0.53). Liver Function Tests:  Recent Labs Lab 07/30/15 1827 08/02/15 1109 08/03/15 0523  AST 19 12* 18  ALT 17 14* 17  ALKPHOS 118 87 84  BILITOT 0.4 0.2* 0.2*  PROT 7.5 6.2* 6.0*  ALBUMIN 3.7 3.0* 3.0*    Recent Labs Lab 08/02/15 1109  LIPASE 41   Coagulation profile  Recent Labs Lab 07/31/15 0435  INR 1.10    CBC:  Recent Labs Lab 07/30/15 1827 07/31/15 0435 08/01/15 0607 08/03/15 0523 08/04/15 0551 08/05/15 0553  WBC 7.8 4.3  --  6.4 9.3 10.8*  HGB 11.1* 10.3* 9.3* 9.5* 10.0* 10.3*  HCT 35.7*  33.3* 30.4* 30.2* 32.3* 32.7*  MCV 94.7 95.4  --  95.6 95.3 94.8  PLT 206 159  --  171 182 189   Cardiac Enzymes: No results for input(s): CKTOTAL, CKMB, CKMBINDEX, TROPONINI in the last 168 hours. Microbiology Recent Results (from the past 240 hour(s))  MRSA PCR Screening     Status: None   Collection Time: 07/31/15  8:40 AM  Result Value Ref Range Status   MRSA by PCR NEGATIVE NEGATIVE Final    Comment:        The GeneXpert MRSA Assay (FDA approved for NASAL specimens only), is one component of a comprehensive MRSA colonization surveillance program. It is not intended to diagnose MRSA infection nor to  guide or monitor treatment for MRSA infections.   Surgical PCR screen     Status: Abnormal   Collection Time: 08/03/15  1:10 AM  Result Value Ref Range Status   MRSA, PCR NEGATIVE NEGATIVE Final   Staphylococcus aureus POSITIVE (A) NEGATIVE Final    Comment:        The Xpert SA Assay (FDA approved for NASAL specimens in patients over 23 years of age), is one component of a comprehensive surveillance program.  Test performance has been validated by [facility] for patients greater than or equal to 64 year old. It is not intended to diagnose infection nor to guide or monitor treatment.      Medications:   . carvedilol  6.25 mg Oral BID WC  . Chlorhexidine Gluconate Cloth  6 each Topical Daily  . feeding supplement (ENSURE ENLIVE)  237 mL Oral BID BM  . ipratropium-albuterol  3 mL Nebulization TID  . mupirocin ointment  1 application Nasal BID  . nicotine  21 mg Transdermal Daily  . predniSONE  40 mg Oral Q breakfast  . sodium chloride flush  3 mL Intravenous Q12H   Continuous Infusions: . sodium chloride 50 mL/hr at 08/05/15 [id number]    Time spent: 25 minutes   LOS: 6 days   RAMA,CHRISTINA  [facility] Pager [phone]. If unable to reach me by pager, please call my cell phone at [phone].  *Please refer to [email or web address], password [email or web address] to get updated  schedule on who will round on this patient, as hospitalists switch teams weekly. If 7PM-7AM, please contact night-coverage at [email or web address], password [email or web address] for any overnight needs.  08/05/2015, 8:43 AM

## 2015-08-05 NOTE — Progress Notes (Signed)
Notified physician that [name] called and requested another 12 lead EKG - one from the morning was not valid - leads off.  Also, patient reports chest pain 8/10 at this time.  Physician putting in orders.

## 2015-08-05 NOTE — Progress Notes (Signed)
Verified telemetry with Darryl from CCM. Pt placed on box 40-60. Verbal order from K.Sophia for cardiac monitoring.

## 2015-08-05 NOTE — Progress Notes (Signed)
Utilization review completed.  

## 2015-08-05 NOTE — Clinical Social Work Note (Signed)
Patient has resources for [facility], CSW to sign off please reconsult if other social work needs arise.  Jones Broom. Ripley, MSW, [place] 08/05/2015 6:32 PM

## 2015-08-05 NOTE — Progress Notes (Signed)
Patient ID: [name], male   DOB: 05/27/1946, 70 y.o.   MRN: [id number] 2 Days Post-Op  Subjective: C/O periumbilical abd pain.  No rectal bleeding.  No nausea  Objective: Vital signs in last 24 hours: Temp:  [98 F (36.7 C)-98.6 F (37 C)] 98.5 F (36.9 C) (02/25 0526) Pulse Rate:  [73-110] 85 (02/25 0526) Resp:  [18-20] 18 (02/25 0526) BP: (132-152)/(53-76) 152/70 mmHg (02/25 0526) SpO2:  [95 %-98 %] 97 % (02/25 0526) Last BM Date: Aug 29, 2015  Intake/Output from previous day: 08-28-2022 0701 - 02/25 0700 In: 2053.7 [P.O.:1080; I.V.:973.7] Out: 975 [Urine:975] Intake/Output this shift:    General appearance: alert, cooperative and no distress GI: normal findings: soft, non-tender and non distended  Lab Results:   Recent Labs  August 29, 2015 0551 08/05/15 0553  WBC 9.3 10.8*  HGB 10.0* 10.3*  HCT 32.3* 32.7*  PLT 182 189   BMET  Recent Labs  08/02/15 1109 08/03/15 0523  NA 135 135  K 4.4 4.3  CL 104 105  CO2 24 24  GLUCOSE 96 118*  BUN 11 14  CREATININE 0.68 0.53*  CALCIUM 8.6* 8.7*     Studies/Results: Dg Abd 2 Views  August 29, 2015  CLINICAL DATA:  left lower abd pain.patient has had intermittent rectal bleeding and then admitted on 07/27/14 with bloody bowel movements x several days at that time undergoing flexible sigmoidoscopy which again noted polyp felt to be cause. History of subtotal colectomy and ileal rectal anastomoses EXAM: ABDOMEN - 2 VIEW COMPARISON:  08/02/2015 FINDINGS: Visualized lung bases clear. Transvenous pacing lead partially visualized. No free air.  Surgical clips right upper abdomen Stomach is distended by ingested material. Small bowel and colon decompressed. Patchy aortoiliac arterial calcifications without suggestion of aneurysm. Fixation hardware into the right femoral head partially visualized. IMPRESSION: 1. Distended stomach. Correlate with any clinical or laboratory evidence of gastroparesis. 2. Small bowel and colon are decompressed.  Electronically Signed   By: Lucrezia Europe M.D.   On: 08-29-15 14:35    Anti-infectives: Anti-infectives    Start     Dose/Rate Route Frequency Ordered Stop   08/29/2015 1400  ertapenem (INVANZ) 1 g in sodium chloride 0.9 % 50 mL IVPB     1 g 100 mL/hr over 30 Minutes Intravenous Every 24 hours 29-Aug-2015 1319 Aug 29, 2015 1516   08/03/15 0800  [MAR Hold]  ertapenem (INVANZ) 1 g in sodium chloride 0.9 % 50 mL IVPB     (MAR Hold since 08/03/15 0831)   1 g 100 mL/hr over 30 Minutes Intravenous 30 min pre-op 08/02/15 1115 08/03/15 1005      Assessment/Plan: s/p Procedure(s): TRANSANAL MINIMALLY INVASIVE SURGERY TO RESECT RECTAL POLYP FLEXIBLE SIGMOIDOSCOPY No further bleeding Peri umbilical abd pain of uncertain etiology.  Abdomen benign on exam KUB yesterday showed gastric distention, ? Gastroparesis, ? Cause of discomfort Pt NPO, repeat x rays pending this AM   LOS: 6 days    Chantae Soo T 08/05/2015

## 2015-08-05 NOTE — Progress Notes (Signed)
Patient c/o chest pains this morning. NP Lynch informed. Stat EKG done which showed A-Fib. V/S T 98.5, HR 85, RR 18, BP 152/70, O2 sats 97 on RA. Patient was given Morphine at approximately 0547. Patient resting comfortably. NP Donnal Debar was informed of EKG results at 0602.

## 2015-08-06 LAB — BASIC METABOLIC PANEL
ANION GAP: 6 (ref 5–15)
BUN: 10 mg/dL (ref 6–20)
CALCIUM: 8.6 mg/dL — AB (ref 8.9–10.3)
CO2: 31 mmol/L (ref 22–32)
Chloride: 100 mmol/L — ABNORMAL LOW (ref 101–111)
Creatinine, Ser: 0.6 mg/dL — ABNORMAL LOW (ref 0.61–1.24)
GFR calc Af Amer: >60 mL/min (ref >60)
GLUCOSE: 91 mg/dL (ref 65–99)
Potassium: 3.8 mmol/L (ref 3.5–5.1)
SODIUM: 137 mmol/L (ref 135–145)

## 2015-08-06 LAB — CBC
HCT: 34.2 % — ABNORMAL LOW (ref 39.0–52.0)
Hemoglobin: 10.5 g/dL — ABNORMAL LOW (ref 13.0–17.0)
MCH: 29.1 pg (ref 26.0–34.0)
MCHC: 30.7 g/dL (ref 30.0–36.0)
MCV: 94.7 fL (ref 78.0–100.0)
PLATELETS: 200 10*3/uL (ref 150–400)
RBC: 3.61 MIL/uL — AB (ref 4.22–5.81)
RDW: 15.2 % (ref 11.5–15.5)
WBC: 7.9 10*3/uL (ref 4.0–10.5)

## 2015-08-06 LAB — TROPONIN I: Troponin I: <0.03 ng/mL (ref <0.031)

## 2015-08-06 MED ORDER — DIPHENHYDRAMINE HCL 25 MG PO CAPS
25.0000 mg | ORAL_CAPSULE | ORAL | Status: DC | PRN
  Administered 2015-08-06 – 2015-08-07 (×2): 25 mg via ORAL
  Filled 2015-08-06 (×2): qty 1

## 2015-08-06 NOTE — Progress Notes (Signed)
Patient ID: [name], male   DOB: 14-Feb-1946, 70 y.o.   MRN: [id number] Patient ID: [name], male   DOB: [birth year], 69 y.o.   MRN: [id number] 3 Days Post-Op  Subjective: Still C/O periumbilical abd pain.  Says he had rectal bleeding last night but not this AM.  Having usual loose stools.  No nausea, wants food.  Having L chest pain, W/U in progress.   Objective: Vital signs in last 24 hours: Temp:  [97.7 F (36.5 C)-98.6 F (37 C)] 97.7 F (36.5 C) (02/26 0516) Pulse Rate:  [81-86] 84 (02/26 0516) Resp:  [18] 18 (02/26 0516) BP: (131-148)/(64-70) 148/68 mmHg (02/26 0516) SpO2:  [95 %-99 %] 97 % (02/26 0757) Last BM Date: 08-18-15  Intake/Output from previous day: 17-Aug-2022 0701 - 02/26 0700 In: 1080 [P.O.:1080] Out: 1800 [Urine:1800] Intake/Output this shift:    General appearance: alert, cooperative and upset this AM GI: normal findings: soft, non-tender and non distended  Lab Results:   Recent Labs  Aug 18, 2015 0553 08/06/15 0534  WBC 10.8* 7.9  HGB 10.3* 10.5*  HCT 32.7* 34.2*  PLT 189 200   BMET  Recent Labs  08/06/15 0534  NA 137  K 3.8  CL 100*  CO2 31  GLUCOSE 91  BUN 10  CREATININE 0.60*  CALCIUM 8.6*     Studies/Results: Dg Abd 2 Views  08-18-2015  CLINICAL DATA:  History of gastric ulcer.  Ileus. EXAM: ABDOMEN - 2 VIEW COMPARISON:  August 04, 2015 FINDINGS: The right side up decubitus films demonstrate no free air. The lung bases are not well assessed. The previously seen gaseous distention is not appreciated on this study. Air-filled loops of bowel are not distended. However, the loops in the lower right abdomen are mildly prominent. No other acute abnormalities. IMPRESSION: 1. Mild ileus not excluded. No obstruction identified. No free air. Electronically Signed   By: Dorise Bullion III M.D   On: 2015/08/18 09:43   Dg Abd 2 Views  08/04/2015  CLINICAL DATA:  left lower abd pain.patient has had intermittent rectal bleeding and then admitted  on 07/27/14 with bloody bowel movements x several days at that time undergoing flexible sigmoidoscopy which again noted polyp felt to be cause. History of subtotal colectomy and ileal rectal anastomoses EXAM: ABDOMEN - 2 VIEW COMPARISON:  08/02/2015 FINDINGS: Visualized lung bases clear. Transvenous pacing lead partially visualized. No free air.  Surgical clips right upper abdomen Stomach is distended by ingested material. Small bowel and colon decompressed. Patchy aortoiliac arterial calcifications without suggestion of aneurysm. Fixation hardware into the right femoral head partially visualized. IMPRESSION: 1. Distended stomach. Correlate with any clinical or laboratory evidence of gastroparesis. 2. Small bowel and colon are decompressed. Electronically Signed   By: Lucrezia Europe M.D.   On: 08/04/2015 14:35    Anti-infectives: Anti-infectives    Start     Dose/Rate Route Frequency Ordered Stop   08/04/15 1400  ertapenem (INVANZ) 1 g in sodium chloride 0.9 % 50 mL IVPB     1 g 100 mL/hr over 30 Minutes Intravenous Every 24 hours 08/04/15 1319 08/04/15 1516   08/03/15 0800  [MAR Hold]  ertapenem (INVANZ) 1 g in sodium chloride 0.9 % 50 mL IVPB     (MAR Hold since 08/03/15 0831)   1 g 100 mL/hr over 30 Minutes Intravenous 30 min pre-op 08/02/15 1115 08/03/15 1005      Assessment/Plan: s/p Procedure(s): TRANSANAL MINIMALLY INVASIVE SURGERY TO RESECT RECTAL POLYP FLEXIBLE SIGMOIDOSCOPY Pt  reports bleeding.  Hbg stable-observe Peri umbilical abd pain of uncertain etiology.  Abdomen benign on exam.  X ray neg.  Advance diet and observe Cardiac W/U in progress   LOS: 7 days    Ammanda Dobbins T 08/06/2015

## 2015-08-06 NOTE — Progress Notes (Signed)
Patient anxious, agitated, and wants to leave AMA. Reports " I want to go outside and smoke a cigarette."  Security contacted.  Physician contacted via telephone.  I and the physician spoke with patient.  Patient agreed to stay.

## 2015-08-06 NOTE — Progress Notes (Signed)
Progress Note   [name] [id number] DOB: 1946-03-04 DOA: 07/30/2015 PCP: [name], MD   Brief Narrative:   [name] is an 70 y.o. male with previous history of subtotal colectomy and ileal rectal anastomoses admitted in May 2016 for rectal bleeding and flexible sigmoidoscopy noted tubulovillous adenoma as cause. Initially recommended for transanal resection the patient did not have this done for unclear etiology. Since then, patient has had intermittent rectal bleeding and then admitted on 07/27/14 with bloody bowel movements x several days at that time undergoing flexible sigmoidoscopy which again noted polyp felt to be cause. Patient left AMA on 07/29/15. He was readmitted on 07/30/15 with ongoing rectal bleeding. Surgery consulted with plan to take patient for polyp resection on Thursday 2/23.  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 - On stool softeners. - Hemoglobin stable.  Active Problems:   Abdominal pain, probable ileus - CT scan 07/16/15 showed significant stool within the distal colon, possible impaction. - Repeat KUB showed mild ileus, no obstruction or free air. - Treat symptomatically. - Diet advanced to regular.    Chest pain - Complained of chest pain on the morning of 08/05/15. EKG showed A. fib. VSS. - Troponins negative 3 sets.    Competency issues - Psychiatric evaluation done 08/04/15: found to be competent. - Homeless. Stays in a neighbor's shed.    Atrial fibrillation [facility]) s/p pacemaker - Continue rate control efforts. Heart rate 60s-90s. - Has not been a candidate for blood thinners secondary to compliance issues.    Protein-calorie malnutrition, severe ([place]) - Patient meets criteria for severe malnutrition in the context of social or environmental circumstances plus  underweight. - Seen by nutrition. Body mass index is 15.88 kg/(m^2). - Continue nutritional supplements.    Essential hypertension, controlled - Controlled on Coreg.    COPD/Shortness of breath - He appears to be on prednisone 60 mg daily, but I am unable to find the details as to when this was started or why. - Wean prednisone to 20 mg daily.    Low-grade non-Hodgkin's B-cell lymphoma, chronic/stable - Follow-up with oncology. Not felt to be a good candidate for treatment in the past.    DVT Prophylaxis - SCDs ordered.   Family Communication/Anticipated D/C date and plan/Code Status   Family Communication: No family.  Says he is getting married to a woman in [place]. Disposition Plan/date:   08/07/15 if abdominal pain improves and hemoglobin does not drop. Code Status: Full code.   IV Access:    Peripheral IV   Procedures and diagnostic studies:   Dg Chest 2 View  07/30/2015  CLINICAL DATA:  SOB, cough. H/o complete heart block, chronic a-fib, non-Hodgkin's lymphoma. Pt c/o bleeding from the rectum. Smoker x 66 years. EXAM: CHEST  2 VIEW COMPARISON:  07/28/2015 FINDINGS: Lungs are mildly hyperinflated. Heart size is normal. Right-sided transvenous pacemaker leads overlie the right atrium and right ventricle. There is minimal density at the left lung base cava most consistent with atelectasis. There are no focal consolidations. No pleural effusions or pulmonary edema. IMPRESSION: 1. Hyperinflation. 2. Minimal left lower lobe atelectasis. Electronically Signed   By: Nolon Nations M.D.   On: 07/30/2015 18:19     Medical Consultants:    Surgery: Dr. [name]  Anti-Infectives:   Anti-infectives    Start  Dose/Rate Route Frequency Ordered Stop   08/04/15 1400  ertapenem (INVANZ) 1 g in sodium chloride 0.9 % 50 mL IVPB     1 g 100 mL/hr over 30 Minutes Intravenous Every 24 hours 08/04/15 1319 08/04/15 1516   08/03/15 0800  [MAR Hold]  ertapenem (INVANZ) 1 g in  sodium chloride 0.9 % 50 mL IVPB     (MAR Hold since 08/03/15 0831)   1 g 100 mL/hr over 30 Minutes Intravenous 30 min pre-op 08/02/15 1115 08/03/15 1005      Subjective:   [name] has had complaints of chest pain and lower abdominal pain. Threatened to leave AMA today when he had to wait a bit for his pain medication because his RN was tied up with an unstable patient.  Wants to smoke.  Was able to talk him out of leaving prematurely.  No N/V.  Some mild rectal bleeding.  Objective:    Filed Vitals:   08/05/15 1400 08/05/15 2255 08/06/15 0516 08/06/15 0757  BP: 139/64 131/70 148/68   Pulse: 86 81 84   Temp: 98.6 F (37 C) 97.7 F (36.5 C) 97.7 F (36.5 C)   TempSrc: Oral Oral Oral   Resp: 18 18 18    Height:      Weight:      SpO2: 95% 97% 99% 97%    Intake/Output Summary (Last 24 hours) at 08/06/15 0906 Last data filed at 08/06/15 0142  Gross per 24 hour  Intake   1080 ml  Output   1800 ml  Net   -720 ml   Filed Weights   07/31/15 0828  Weight: 48.8 kg (107 lb 9.4 oz)    Exam: Gen:  NAD, thin Cardiovascular:  RRR, No M/R/G Respiratory:  Lungs CTAB Gastrointestinal:  Abdomen soft, NT/ND, + BS Extremities:  No C/E/C   Data Reviewed:    Labs: Basic Metabolic Panel:  Recent Labs Lab 07/30/15 1827 07/31/15 0435 08/02/15 1109 08/03/15 0523 08/06/15 0534  NA 140 140 135 135 137  K 5.2* 4.3 4.4 4.3 3.8  CL 108 110 104 105 100*  CO2 25 24 24 24 31   GLUCOSE 99 110* 96 118* 91  BUN 20 22* 11 14 10   CREATININE 1.13 0.78 0.68 0.53* 0.60*  CALCIUM 9.2 8.9 8.6* 8.7* 8.6*   GFR Estimated Creatinine Clearance: 61 mL/min (by C-G formula based on Cr of 0.6). Liver Function Tests:  Recent Labs Lab 07/30/15 1827 08/02/15 1109 08/03/15 0523  AST 19 12* 18  ALT 17 14* 17  ALKPHOS 118 87 84  BILITOT 0.4 0.2* 0.2*  PROT 7.5 6.2* 6.0*  ALBUMIN 3.7 3.0* 3.0*    Recent Labs Lab 08/02/15 1109  LIPASE 41   Coagulation profile  Recent Labs Lab  07/31/15 0435  INR 1.10    CBC:  Recent Labs Lab 07/31/15 0435 08/01/15 0607 08/03/15 0523 08/04/15 0551 08/05/15 0553 08/06/15 0534  WBC 4.3  --  6.4 9.3 10.8* 7.9  HGB 10.3* 9.3* 9.5* 10.0* 10.3* 10.5*  HCT 33.3* 30.4* 30.2* 32.3* 32.7* 34.2*  MCV 95.4  --  95.6 95.3 94.8 94.7  PLT 159  --  171 182 189 200   Cardiac Enzymes:  Recent Labs Lab 08/05/15 1306 08/05/15 1844 08/06/15 0008  TROPONINI <0.03 <0.03 <0.03   Microbiology Recent Results (from the past 240 hour(s))  MRSA PCR Screening     Status: None   Collection Time: 07/31/15  8:40 AM  Result Value Ref Range Status  MRSA by PCR NEGATIVE NEGATIVE Final    Comment:        The GeneXpert MRSA Assay (FDA approved for NASAL specimens only), is one component of a comprehensive MRSA colonization surveillance program. It is not intended to diagnose MRSA infection nor to guide or monitor treatment for MRSA infections.   Surgical PCR screen     Status: Abnormal   Collection Time: 08/03/15  1:10 AM  Result Value Ref Range Status   MRSA, PCR NEGATIVE NEGATIVE Final   Staphylococcus aureus POSITIVE (A) NEGATIVE Final    Comment:        The Xpert SA Assay (FDA approved for NASAL specimens in patients over 57 years of age), is one component of a comprehensive surveillance program.  Test performance has been validated by [facility] for patients greater than or equal to 63 year old. It is not intended to diagnose infection nor to guide or monitor treatment.      Medications:   . carvedilol  6.25 mg Oral BID WC  . Chlorhexidine Gluconate Cloth  6 each Topical Daily  . feeding supplement (ENSURE ENLIVE)  237 mL Oral BID BM  . ipratropium-albuterol  3 mL Nebulization BID  . mupirocin ointment  1 application Nasal BID  . nicotine  21 mg Transdermal Daily  . predniSONE  20 mg Oral Q breakfast  . sodium chloride flush  3 mL Intravenous Q12H   Continuous Infusions: . sodium chloride 50 mL/hr at  08/06/15 0443    Time spent: 25 minutes   LOS: 7 days   Rajan Burgard  [facility] Pager [phone]. If unable to Goodwin me by pager, please call my cell phone at [phone].  *Please refer to [email or web address], password [email or web address] to get updated schedule on who will round on this patient, as hospitalists switch teams weekly. If 7PM-7AM, please contact night-coverage at [email or web address], password [email or web address] for any overnight needs.  08/06/2015, 9:06 AM

## 2015-08-07 LAB — CBC
HCT: 35.6 % — ABNORMAL LOW (ref 39.0–52.0)
Hemoglobin: 10.8 g/dL — ABNORMAL LOW (ref 13.0–17.0)
MCH: 28.9 pg (ref 26.0–34.0)
MCHC: 30.3 g/dL (ref 30.0–36.0)
MCV: 95.2 fL (ref 78.0–100.0)
PLATELETS: 217 10*3/uL (ref 150–400)
RBC: 3.74 MIL/uL — ABNORMAL LOW (ref 4.22–5.81)
RDW: 15.3 % (ref 11.5–15.5)
WBC: 7.4 10*3/uL (ref 4.0–10.5)

## 2015-08-07 MED ORDER — OXYCODONE HCL 5 MG PO TABS: 5.0000 mg | ORAL_TABLET | Freq: Four times a day (QID) | ORAL | Status: DC | PRN

## 2015-08-07 MED ORDER — ALBUTEROL SULFATE HFA 108 (90 BASE) MCG/ACT IN AERS: 2.0000 | INHALATION_SPRAY | RESPIRATORY_TRACT | Status: AC | PRN

## 2015-08-07 MED ORDER — ENSURE ENLIVE PO LIQD: 237.0000 mL | Freq: Two times a day (BID) | ORAL | Status: AC

## 2015-08-07 NOTE — Discharge Summary (Signed)
Physician Discharge Summary  [name] [id number] DOB: 02/18/1946 DOA: 07/30/2015  PCP: Damon Poll, MD  Admit date: 07/30/2015 Discharge date: 08/07/2015   Recommendations for Outpatient Follow-Up:   1. The patient is being discharged to a SNF for further rehabilitation. 2. Please schedule outpatient follow-up with oncologist for evaluation of B-cell lymphoma.   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  Diet recommendation: Low sodium, heart healthy.    History of Present Illness:   [name] is an 70 y.o. male with previous history of subtotal colectomy and ileal rectal anastomoses admitted in May 2016 for rectal bleeding and flexible sigmoidoscopy noted tubulovillous adenoma as cause. Initially recommended for transanal resection the patient did not have this done for unclear etiology. Since then, patient has had intermittent rectal bleeding and then admitted on 07/27/14 with bloody bowel movements x several days at that time undergoing flexible sigmoidoscopy which again noted polyp felt to be cause. Patient left AMA on 07/29/15. He was readmitted on 07/30/15 with ongoing rectal bleeding. Surgery consulted for polyp resection.  Hospital Course by Problem:      Principal Problem:  Rectal bleeding secondary to dysplastic rectal polyp with acute blood loss anemia - Definitive treatment with surgical removal of polyp resection done 08/03/15. - On stool softeners. -  Hemoglobin stable.  Active Problems:  Abdominal pain, probable ileus - CT scan 07/16/15 showed significant stool within the distal colon, possible impaction. - Repeat KUB showed mild ileus, no obstruction or free air. - Treated symptomatically. - Tolerated diet advancement.   Chest pain - Complained of chest pain on the morning of 08/05/15. EKG showed A. fib. VSS. - Troponins negative 3 sets.   Competency issues - Psychiatric evaluation done 08/04/15: found to be competent. - Homeless. Stays in a neighbor's shed. - Has agreed to SNF placement.   Atrial fibrillation [facility]) s/p pacemaker - Continue rate control efforts. Heart rate 60s-90s. - Has not been a candidate for blood thinners secondary to compliance issues.   Protein-calorie malnutrition, severe ([place]) - Patient meets criteria for severe malnutrition in the context of social or environmental circumstances plus underweight. - Seen by nutrition. Body mass index is 15.88 kg/(m^2). - Continue nutritional supplements.   Essential hypertension, controlled - Controlled on Coreg.   COPD/Shortness of breath - He appears to be on prednisone 60 mg daily, but I am unable to find the details as to when this was started or why. - Prednisone weaned prior to discharge.   Low-grade non-Hodgkin's B-cell lymphoma, chronic/stable - Follow-up with oncology. Not felt to be a good candidate for treatment in the past.      Medical Consultants:    None.   Discharge Exam:   Filed Vitals:   08/07/15 0429 08/07/15 1446  BP: 141/86 123/70  Pulse: 79 74  Temp: 98.6 F (37 C) 98.9 F (37.2 C)  Resp: 20 20   Filed Vitals:   08/06/15  2144 08/07/15 0429 08/07/15 1011 08/07/15 1446  BP:  141/86  123/70  Pulse:  79  74  Temp:  98.6 F (37 C)  98.9 F (37.2 C)  TempSrc:  Oral  Oral  Resp:  20  20  Height:      Weight:      SpO2: 98% 97% 96% 98%    Gen:  NAD Cardiovascular:  RRR, No M/R/G Respiratory: Lungs  CTAB Gastrointestinal: Abdomen soft, NT/ND with normal active bowel sounds. Extremities: No C/E/C   The results of significant diagnostics from this hospitalization (including imaging, microbiology, ancillary and laboratory) are listed below for reference.     Procedures and Diagnostic Studies:   Dg Chest 2 View  07/30/2015  CLINICAL DATA:  SOB, cough. H/o complete heart block, chronic a-fib, non-Hodgkin's lymphoma. Pt c/o bleeding from the rectum. Smoker x 66 years. EXAM: CHEST  2 VIEW COMPARISON:  07/28/2015 FINDINGS: Lungs are mildly hyperinflated. Heart size is normal. Right-sided transvenous pacemaker leads overlie the right atrium and right ventricle. There is minimal density at the left lung base cava most consistent with atelectasis. There are no focal consolidations. No pleural effusions or pulmonary edema. IMPRESSION: 1. Hyperinflation. 2. Minimal left lower lobe atelectasis. Electronically Signed   By: Nolon Nations M.D.   On: 07/30/2015 18:19   Dg Chest 2 View  07/24/2015  CLINICAL DATA:  Chest pain intermittent for several days. EXAM: CHEST  2 VIEW COMPARISON:  07/18/2015 FINDINGS: Atherosclerotic aortic arch. Dual lead pacer, lead positioning unchanged. Large lung volumes. Tapering of the peripheral pulmonary vasculature favors emphysema. Upper normal heart size. No pleural effusion or edema. Mild thoracic spondylosis. IMPRESSION: 1. Emphysema. 2. Atherosclerotic aortic arch. 3. Upper normal heart size. Electronically Signed   By: Van Clines M.D.   On: 07/24/2015 07:59   Dg Chest 2 View  07/18/2015  CLINICAL DATA:  Left chest pain for 1 day. EXAM: CHEST  2 VIEW COMPARISON:  07/15/2015. FINDINGS: Normal sized heart. Clear lungs. Stable right subclavian pacemaker leads. Mild thoracic spine degenerative changes. IMPRESSION: No acute abnormality. Electronically Signed   By: Claudie Revering M.D.   On: 07/18/2015 20:35   Dg Chest 2 View  07/15/2015  CLINICAL DATA:  Chest pain and  shortness of breath.  No radiation. EXAM: CHEST  2 VIEW COMPARISON:  07/12/2015 FINDINGS: The lungs are hyperinflated likely secondary to COPD. There is no focal parenchymal opacity. There is no pleural effusion or pneumothorax. The heart and mediastinal contours are unremarkable. There is a 2 lead cardiac pacemaker. The osseous structures are unremarkable. IMPRESSION: No active cardiopulmonary disease. Electronically Signed   By: Kathreen Devoid   On: 07/15/2015 16:07   Dg Chest 2 View  07/12/2015  CLINICAL DATA:  Left-sided non radiating chest pain worsening today. EXAM: CHEST  2 VIEW COMPARISON:  04/13/2015 FINDINGS: Dual-chamber pacer from the right with leads in unchanged position. Normal heart size and mediastinal contours. There is no edema, consolidation, effusion, or pneumothorax. No osseous findings to explain acute chest pain IMPRESSION: No acute finding Electronically Signed   By: Monte Fantasia M.D.   On: 07/12/2015 23:39   Dg Abd 1 View  08/02/2015  CLINICAL DATA:  Subtotal colectomy with ileal rectal anastomosis April 2016, abdominal pain and bloody stools currently EXAM: ABDOMEN - 1 VIEW COMPARISON:  07/16/2015 FINDINGS: Air throughout the small bowel, which is not distended. There is gas into the rectum. Bowel anastomosis right lower pelvis noted. IMPRESSION: Nonobstructive gas pattern Electronically Signed  By: Skipper Cliche M.D.   On: 08/02/2015 11:23   Ct Abdomen Pelvis W Contrast  07/16/2015  CLINICAL DATA:  Pt states he started having bloody stools last night. Hx of same. Pt was here yesterday for chest pain No oral per ER MD 172ml omni 300^144mL OMNIPAQUE IOHEXOL 300 MG/ML SOLN EXAM: CT ABDOMEN AND PELVIS WITH CONTRAST TECHNIQUE: Multidetector CT imaging of the abdomen and pelvis was performed using the standard protocol following bolus administration of intravenous contrast. CONTRAST:  136mL OMNIPAQUE IOHEXOL 300 MG/ML  SOLN COMPARISON:  03/02/2015 and prior study 10/27/2006 FINDINGS:  Lower chest: Heart is mildly enlarged. Transvenous pacemaker lead to the right ventricle. Coronary stent is visible. Upper abdomen: No focal abnormality identified within the liver, spleen, pancreas. The gallbladder is surgically absent. There is mild and stable dilatation of the intra and extrahepatic biliary ducts. Stable right adrenal lesion is 0.8 x 1.6 cm and is low in attenuation. The left adrenal gland has a normal appearance. There are bilateral renal cysts. No hydronephrosis. Gastrointestinal tract: The stomach is less distended than on the previous exam. Small bowel loops descends into the right mid abdomen consistent with small bowel malrotation. Patient has undergone partial colectomy. The distal colonic loops, sigmoid, and rectum are distended with of large amount of stool. Pelvis: Urinary bladder has a normal appearance. Prostate gland has a normal appearance. No free pelvic fluid. Retroperitoneum: There is marked atherosclerotic calcification of the abdominal aorta. Suspect at least partial occlusion of the right common iliac artery at the bifurcation. This is later reconstituted. There is a vascular contrast within the superior and inferior mesenteric arteries as well as the celiac axis. No retroperitoneal or mesenteric adenopathy. Abdominal wall: Unremarkable. Osseous structures: Previous right hip ORIF. Mild degenerative changes are seen in the lumbar spine. No suspicious lytic or blastic lesions are identified. IMPRESSION: 1. Significant stool within the distal colonic loops consistent with impaction. No definite mass is identified as a cause for obstruction. 2. Marked atherosclerosis of the abdominal aorta and iliac arteries. 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 Clinical correlation is recommended regarding possible right lower extremity ischemic symptoms. 3. Status post partial colectomy. 4. Cardiomegaly.  Coronary stent. 5. Status post cholecystectomy. Stable appearance  of mildly dilated intra and extrahepatic biliary ducts. 6. Stable right adrenal lesion, consistent with a benign adenoma given the long-term stability. 7. Bilateral renal cysts. 8. Partial small bowel malrotation. Electronically Signed   By: Nolon Nations M.D.   On: 07/16/2015 19:10   Dg Chest Port 1 View  07/29/2015  CLINICAL DATA:  GI bleed. EXAM: PORTABLE CHEST 1 VIEW COMPARISON:  07/24/2015 chest radiograph. FINDINGS: Right subclavian 2 lead pacemaker is stable in configuration with lead tips overlying the right atrium and right ventricle. Stable cardiomediastinal silhouette with top-normal heart size. No pneumothorax. No pleural effusion. Lungs appear clear, with no acute consolidative airspace disease and no pulmonary edema. IMPRESSION: No active disease. Electronically Signed   By: Ilona Sorrel M.D.   On: 07/29/2015 00:03   Dg Abd 2 Views  08/05/2015  CLINICAL DATA:  History of gastric ulcer.  Ileus. EXAM: ABDOMEN - 2 VIEW COMPARISON:  August 04, 2015 FINDINGS: The right side up decubitus films demonstrate no free air. The lung bases are not well assessed. The previously seen gaseous distention is not appreciated on this study. Air-filled loops of bowel are not distended. However, the loops in the lower right abdomen are mildly prominent. No other acute abnormalities. IMPRESSION: 1.  Mild ileus not excluded. No obstruction identified. No free air. Electronically Signed   By: Dorise Bullion III M.D   On: 08/05/2015 09:43   Dg Abd 2 Views  08/04/2015  CLINICAL DATA:  left lower abd pain.patient has had intermittent rectal bleeding and then admitted on 07/27/14 with bloody bowel movements x several days at that time undergoing flexible sigmoidoscopy which again noted polyp felt to be cause. History of subtotal colectomy and ileal rectal anastomoses EXAM: ABDOMEN - 2 VIEW COMPARISON:  08/02/2015 FINDINGS: Visualized lung bases clear. Transvenous pacing lead partially visualized. No free air.   Surgical clips right upper abdomen Stomach is distended by ingested material. Small bowel and colon decompressed. Patchy aortoiliac arterial calcifications without suggestion of aneurysm. Fixation hardware into the right femoral head partially visualized. IMPRESSION: 1. Distended stomach. Correlate with any clinical or laboratory evidence of gastroparesis. 2. Small bowel and colon are decompressed. Electronically Signed   By: Lucrezia Europe M.D.   On: 08/04/2015 14:35     Labs:   Basic Metabolic Panel:  Recent Labs Lab 08/02/15 1109 08/03/15 0523 08/06/15 0534  NA 135 135 137  K 4.4 4.3 3.8  CL 104 105 100*  CO2 24 24 31   GLUCOSE 96 118* 91  BUN 11 14 10   CREATININE 0.68 0.53* 0.60*  CALCIUM 8.6* 8.7* 8.6*   GFR Estimated Creatinine Clearance: 61 mL/min (by C-G formula based on Cr of 0.6). Liver Function Tests:  Recent Labs Lab 08/02/15 1109 08/03/15 0523  AST 12* 18  ALT 14* 17  ALKPHOS 87 84  BILITOT 0.2* 0.2*  PROT 6.2* 6.0*  ALBUMIN 3.0* 3.0*    Recent Labs Lab 08/02/15 1109  LIPASE 41    CBC:  Recent Labs Lab 08/03/15 0523 08/04/15 0551 08/05/15 0553 08/06/15 0534 08/07/15 0534  WBC 6.4 9.3 10.8* 7.9 7.4  HGB 9.5* 10.0* 10.3* 10.5* 10.8*  HCT 30.2* 32.3* 32.7* 34.2* 35.6*  MCV 95.6 95.3 94.8 94.7 95.2  PLT 171 182 189 200 217   Cardiac Enzymes:  Recent Labs Lab 08/05/15 1306 08/05/15 1844 08/06/15 0008  TROPONINI <0.03 <0.03 <0.03   Microbiology Recent Results (from the past 240 hour(s))  MRSA PCR Screening     Status: None   Collection Time: 07/31/15  8:40 AM  Result Value Ref Range Status   MRSA by PCR NEGATIVE NEGATIVE Final    Comment:        The GeneXpert MRSA Assay (FDA approved for NASAL specimens only), is one component of a comprehensive MRSA colonization surveillance program. It is not intended to diagnose MRSA infection nor to guide or monitor treatment for MRSA infections.   Surgical PCR screen     Status: Abnormal    Collection Time: 08/03/15  1:10 AM  Result Value Ref Range Status   MRSA, PCR NEGATIVE NEGATIVE Final   Staphylococcus aureus POSITIVE (A) NEGATIVE Final    Comment:        The Xpert SA Assay (FDA approved for NASAL specimens in patients over 43 years of age), is one component of a comprehensive surveillance program.  Test performance has been validated by [facility] for patients greater than or equal to 17 year old. It is not intended to diagnose infection nor to guide or monitor treatment.      Discharge Instructions:   Discharge Instructions    Call MD for:  persistant nausea and vomiting    Complete by:  As directed      Call MD for:  severe uncontrolled pain    Complete by:  As directed      Call MD for:  temperature >100.4    Complete by:  As directed      Diet - low sodium heart healthy    Complete by:  As directed      Increase activity slowly    Complete by:  As directed      [place]     Complete by:  As directed             Medication List    STOP taking these medications        predniSONE 20 MG tablet  Commonly known as:  DELTASONE     traMADol 50 MG tablet  Commonly known as:  ULTRAM      TAKE these medications        albuterol 108 (90 Base) MCG/ACT inhaler  Commonly known as:  PROVENTIL HFA;VENTOLIN HFA  Inhale 2 puffs into the lungs every 4 (four) hours as needed for wheezing or shortness of breath.     carvedilol 6.25 MG tablet  Commonly known as:  COREG  Take 1 tablet (6.25 mg total) by mouth 2 (two) times daily with a meal.     feeding supplement (ENSURE ENLIVE) Liqd  Take 237 mLs by mouth 2 (two) times daily between meals.     oxyCODONE 5 MG immediate release tablet  Commonly known as:  Oxy IR/ROXICODONE  Take 1 tablet (5 mg total) by mouth every 6 (six) hours as needed for severe pain.           Follow-up Information    Follow up with Rosario Adie., MD In 2 weeks.   Specialty:  General Surgery   Contact information:    [address] [place] [address] [phone]       Follow up with Damon Poll, MD. Schedule an appointment as soon as possible for a visit in 2 weeks.   Specialty:  Family Medicine   Why:  Hospital follow up.   Contact information:   [address] [place] [address] [phone]        Time coordinating discharge: 35 minutes.  Signed:  Chandlor Noecker  Pager [phone] [facility] 08/07/2015, 2:59 PM

## 2015-08-07 NOTE — Progress Notes (Signed)
Report called to [place] at [employer].

## 2015-08-07 NOTE — Progress Notes (Signed)
Physical Therapy Treatment Patient Details Name: [name] MRN: [id number] DOB: 02-17-1946 Today's Date: 08/07/2015    History of Present Illness [name] is an 70 y.o. male with previous history of subtotal colectomy and ileal rectal anastomoses admitted in May 2016 for rectal bleeding and flexible sigmoidoscopy noted tubulovillous adenoma as cause. Initially recommended for transanal resection the patient did not have this done for unclear etiology. Since then, patient has had intermittent rectal bleeding and then admitted on 07/27/14 with bloody bowel movements x several days at that time undergoing flexible sigmoidoscopy which again noted polyp felt to be cause. Patient left AMA on 07/29/15. He was readmitted on 07/30/15 with ongoing rectal bleeding. Surgery consulted with plan to take patient for polyp resection on Thursday 2/23.    PT Comments    Pt states he is discharging on today. Unsure of pt's discharge plans-states he is going to [employer]?? Pt clearly needs RW for ambulation however unsure if he will use it out in community. Pt would likely benefit from SNF placement if he would agree.   Follow Up Recommendations  SNF (if pt is agreeable)     Equipment Recommendations  Rolling walker with 5" wheels    Recommendations for Other Services       Precautions / Restrictions Precautions Precautions: Fall Restrictions Weight Bearing Restrictions: No    Mobility  Bed Mobility Overal bed mobility: Modified Independent                Transfers Overall transfer level: Needs assistance Equipment used: Rolling walker (2 wheeled) Transfers: Sit to/from Stand Sit to Stand: Supervision         General transfer comment: for safety  Ambulation/Gait Ambulation/Gait assistance: Min guard Ambulation Distance (Feet): 45 Feet Assistive device: Rolling walker (2 wheeled) Gait Pattern/deviations: Antalgic;Step-to pattern;Trunk flexed;Decreased stance time -  right     General Gait Details: close guard for safety. Distance limited by fatigue, pain in R LE with ambulation.    Stairs            Wheelchair Mobility    Modified Rankin (Stroke Patients Only)       Balance           Standing balance support: During functional activity Standing balance-Leahy Scale: Fair                      Cognition Arousal/Alertness: Awake/alert Behavior During Therapy: WFL for tasks assessed/performed Overall Cognitive Status: No family/caregiver present to determine baseline cognitive functioning           Safety/Judgement: Decreased awareness of safety     General Comments: pt tends to confabulate--states he is going to go to [place] and get married when he leaves the hospital. Poor insight into current condition/mobility deficitis    Exercises      General Comments        Pertinent Vitals/Pain Pain Assessment: Faces Faces Pain Scale: Hurts even more Pain Location: R LE with ambulation Pain Intervention(s): Limited activity within patient's tolerance;Monitored during session;Repositioned    Home Living                      Prior Function            PT Goals (current goals can now be found in the care plan section) Progress towards PT goals: Progressing toward goals    Frequency  Min 3X/week    PT Plan Current plan remains appropriate  Co-evaluation             End of Session   Activity Tolerance: Patient limited by pain;Patient limited by fatigue Patient left: in bed;with call bell/phone within reach;with bed alarm set     Time: [id number] PT Time Calculation (min) (ACUTE ONLY): 8 min  Charges:  $Gait Training: 8-22 mins                    G Codes:      Weston Anna, MPT Pager: [phone]

## 2015-08-07 NOTE — NC FL2 (Signed)
[place] LEVEL OF CARE SCREENING TOOL     IDENTIFICATION  Patient Name: [name] Birthdate: Mar 30, 1946 Sex: male Admission Date (Current Location): 07/30/2015  [facility] [place] Number:  Herbalist and Address:  [facility],  [place] [address], [place]      Provider Number: [id number]  Attending Physician Name and Address:  Venetia Maxon Rama, MD  Relative Name and Phone Number:       Current Level of Care: Hospital Recommended Level of Care: [place] Prior Approval Number:    Date Approved/Denied:   PASRR Number: [id number]  Discharge Plan: SNF    Current Diagnoses: Patient Active Problem List   Diagnosis Date Noted  . Ileus, postoperative 08/05/2015  . Chest pain 08/05/2015  . COPD exacerbation ([place]) 08/04/2015  . Homelessness 08/04/2015  . Dysplastic rectal polyp   . Bleeding rectal polyp s/p partial proctectomy 08/03/2015   . Chronic abdominal pain   . 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 Pressure ulcer 11/16/2014  . Hx of colectomy   . History of adenomatous polyp of colon   . Rectal mass   . Essential hypertension   . Rectal bleeding   . Acute blood loss anemia   . Delirium   . Protein-calorie malnutrition, severe ([place]) 10/08/2014  . Hematochezia   . Sigmoid & cecal bleeding colon masses s/p abdominal colectomy 10/05/2014   . HLD (hyperlipidemia) 09/30/2014  . Depression 09/30/2014  . Abdominal pain 09/30/2014  . BPH (benign prostatic hyperplasia) 09/29/2014  . Thrombocytopenia ([place]) 12/24/2006  . DEPRESSION, MAJOR, MODERATE 12/24/2006  . LEARNING DISABILITY 12/24/2006  . 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 CARDIOMYOPATHY 12/24/2006  . BLOCK, AV, COMPLETE 12/24/2006  . ATHEROSCLEROSIS, AORTIC 12/24/2006  . Atrial fibrillation ([place]) 12/24/2006  . History of other specified conditions presenting hazards to health 12/24/2006  . CHOLECYSTECTOMY, HX OF 12/24/2006     Orientation RESPIRATION BLADDER Height & Weight     Self, Time, Situation, Place  Normal Continent Weight: 107 lb 9.4 oz (48.8 kg) Height:  5\' 9"  (175.3 cm)  BEHAVIORAL SYMPTOMS/MOOD NEUROLOGICAL BOWEL NUTRITION STATUS      Continent Diet (Regular)  AMBULATORY STATUS COMMUNICATION OF NEEDS Skin   Limited Assist Verbally PU Stage and Appropriate Care, Surgical wounds (PressureUlcer02/18/17StageI-Intactskinwithnon-blanchablerednessofalocalizedareausuallyoverabonyprominence. & Incision(Closed)02/23/17Rectum)                       Personal Care Assistance Level of Assistance  Bathing, Dressing Bathing Assistance: Limited assistance   Dressing Assistance: Limited assistance     Functional Limitations Info             SPECIAL CARE FACTORS FREQUENCY  PT (By licensed PT), OT (By licensed OT)     PT Frequency: 5 OT Frequency: 5            Contractures      Additional Factors Info  Code Status, Allergies Code Status Info: Fullcode Allergies Info: NKDA           Current Medications (08/07/2015):  This is the current hospital active medication list Current Facility-Administered Medications  Medication Dose Route Frequency Provider Last Rate Last Dose  . 0.9 %  sodium chloride infusion   Intravenous Continuous [name], MD 50 mL/hr at 08/07/15 0145    . acetaminophen (TYLENOL) tablet 650 mg  650 mg Oral Q6H PRN Norval Morton, MD   650 mg at 08/04/15 [id number]   Or  .  acetaminophen (TYLENOL) suppository 650 mg  650 mg Rectal Q6H PRN Norval Morton, MD   650 mg at 08/04/15 1557  . carvedilol (COREG) tablet 6.25 mg  6.25 mg Oral BID WC Rondell Charmayne Sheer, MD   6.25 mg at 08/07/15 0957  . diphenhydrAMINE (BENADRYL) capsule 25 mg  25 mg Oral Q4H PRN Venetia Maxon Rama, MD   25 mg at 08/07/15 0151  . feeding supplement (ENSURE ENLIVE) (ENSURE ENLIVE) liquid 237 mL  237 mL Oral BID BM Venetia Maxon Rama, MD   237 mL at 08/07/15 0958  .  ipratropium-albuterol (DUONEB) 0.5-2.5 (3) MG/3ML nebulizer solution 3 mL  3 mL Nebulization Q6H PRN Annita Brod, MD   3 mL at 08/04/15 0301  . ipratropium-albuterol (DUONEB) 0.5-2.5 (3) MG/3ML nebulizer solution 3 mL  3 mL Nebulization BID Venetia Maxon Rama, MD   3 mL at 08/07/15 1010  . LORazepam (ATIVAN) injection 1 mg  1 mg Intravenous Q4H PRN Annita Brod, MD   1 mg at 08/06/15 1416  . morphine 2 MG/ML injection 1 mg  1 mg Intravenous Q2H PRN Norval Morton, MD   1 mg at 08/07/15 0159  . mupirocin ointment (BACTROBAN) 2 % 1 application  1 application Nasal BID Reyne Dumas, MD   1 application at [id number]  . nicotine (NICODERM CQ - dosed in mg/24 hours) patch 21 mg  21 mg Transdermal Daily Norval Morton, MD   21 mg at 08/07/15 0957  . ondansetron (ZOFRAN) tablet 4 mg  4 mg Oral Q6H PRN Norval Morton, MD       Or  . ondansetron (ZOFRAN) injection 4 mg  4 mg Intravenous Q6H PRN Norval Morton, MD   4 mg at 08/03/15 1125  . oxyCODONE (Oxy IR/ROXICODONE) immediate release tablet 5 mg  5 mg Oral Q6H PRN Reyne Dumas, MD   5 mg at 08/06/15 1027  . sodium chloride flush (NS) 0.9 % injection 3 mL  3 mL Intravenous Q12H Rondell A Tamala Julian, MD   3 mL at 08/03/15 2306  . zolpidem (AMBIEN) tablet 5 mg  5 mg Oral QHS PRN Norval Morton, MD         Discharge Medications: Please see discharge summary for a list of discharge medications.  Relevant Imaging Results:  Relevant Lab Results:   Additional Information SSN: [id number]  Standley Brooking, LCSW

## 2015-08-07 NOTE — Progress Notes (Signed)
Patient ID: [name], male   DOB: 1946-04-04, 70 y.o.   MRN: [id number]     [address] [place], [place] [address]    Phone: [phone] FAX: [phone]     Subjective: H&H stable.  On bloody bms.  Having bm's. abd pain resolved last night.   Objective:  Vital signs:  Filed Vitals:   08/06/15 2124 08/06/15 2144 08/07/15 0429 08/07/15 1011  BP: 144/63  141/86   Pulse: 94  79   Temp: 98.4 F (36.9 C)  98.6 F (37 C)   TempSrc: Oral  Oral   Resp: 20  20   Height:      Weight:      SpO2: 98% 98% 97% 96%    Last BM Date: 08/06/15  Intake/Output   Yesterday:  02/26 0701 - 02/27 0700 In: 600 [I.V.:600] Out: -  This shift:  Total I/O In: 320 [P.O.:320] Out: -    Physical Exam: General: Pt awake/alert/oriented x4 in no acute distress  Abdomen: Soft.  Nondistended.  Non tender.  No evidence of peritonitis.  No incarcerated hernias.    Problem List:   Principal Problem:   Bleeding rectal polyp s/p partial proctectomy 08/03/2015 Active Problems:   Atrial fibrillation (HCC)   Abdominal pain   Hematochezia   Protein-calorie malnutrition, severe (HCC)   Essential hypertension   Rectal bleeding   Acute blood loss anemia   Extranodal marginal zone B-cell lymphoma (HCC)   Dysplastic rectal polyp   COPD exacerbation (HCC)   Homelessness   Ileus, postoperative   Chest pain    Results:   Labs: Results for orders placed or performed during the hospital encounter of 07/30/15 (from the past 48 hour(s))  Troponin I (q 6hr x 3)     Status: None   Collection Time: 08/05/15  1:06 PM  Result Value Ref Range   Troponin I <0.03 <0.031 ng/mL    Comment:        NO INDICATION OF MYOCARDIAL INJURY.   Troponin I (q 6hr x 3)     Status: None   Collection Time: 08/05/15  6:44 PM  Result Value Ref Range   Troponin I <0.03 <0.031 ng/mL    Comment:        NO INDICATION OF MYOCARDIAL INJURY.    Troponin I (q 6hr x 3)     Status: None   Collection Time: 08/06/15 12:08 AM  Result Value Ref Range   Troponin I <0.03 <0.031 ng/mL    Comment:        NO INDICATION OF MYOCARDIAL INJURY.   CBC     Status: Abnormal   Collection Time: 08/06/15  5:34 AM  Result Value Ref Range   WBC 7.9 4.0 - 10.5 K/uL   RBC 3.61 (L) 4.22 - 5.81 MIL/uL   Hemoglobin 10.5 (L) 13.0 - 17.0 g/dL   HCT 34.2 (L) 39.0 - 52.0 %   MCV 94.7 78.0 - 100.0 fL   MCH 29.1 26.0 - 34.0 pg   MCHC 30.7 30.0 - 36.0 g/dL   RDW 15.2 11.5 - 15.5 %   Platelets 200 150 - 400 K/uL  Basic metabolic panel     Status: Abnormal   Collection Time: 08/06/15  5:34 AM  Result Value Ref Range   Sodium 137 135 - 145 mmol/L   Potassium 3.8 3.5 - 5.1 mmol/L   Chloride 100 (L) 101 -  111 mmol/L   CO2 31 22 - 32 mmol/L   Glucose, Bld 91 65 - 99 mg/dL   BUN 10 6 - 20 mg/dL   Creatinine, Ser 0.60 (L) 0.61 - 1.24 mg/dL   Calcium 8.6 (L) 8.9 - 10.3 mg/dL   GFR calc non Af Amer >60 >60 mL/min   GFR calc Af Amer >60 >60 mL/min    Comment: (NOTE) The eGFR has been calculated using the CKD EPI equation. This calculation has not been validated in all clinical situations. eGFR's persistently <60 mL/min signify possible Chronic Kidney Disease.    Anion gap 6 5 - 15  CBC     Status: Abnormal   Collection Time: 08/07/15  5:34 AM  Result Value Ref Range   WBC 7.4 4.0 - 10.5 K/uL   RBC 3.74 (L) 4.22 - 5.81 MIL/uL   Hemoglobin 10.8 (L) 13.0 - 17.0 g/dL   HCT 35.6 (L) 39.0 - 52.0 %   MCV 95.2 78.0 - 100.0 fL   MCH 28.9 26.0 - 34.0 pg   MCHC 30.3 30.0 - 36.0 g/dL   RDW 15.3 11.5 - 15.5 %   Platelets 217 150 - 400 K/uL    Imaging / Studies: No results found.  Medications / Allergies:  Scheduled Meds: . carvedilol  6.25 mg Oral BID WC  . Chlorhexidine Gluconate Cloth  6 each Topical Daily  . feeding supplement (ENSURE ENLIVE)  237 mL Oral BID BM  . ipratropium-albuterol  3 mL Nebulization BID  . mupirocin ointment  1 application  Nasal BID  . nicotine  21 mg Transdermal Daily  . sodium chloride flush  3 mL Intravenous Q12H   Continuous Infusions: . sodium chloride 50 mL/hr at 08/07/15 0145   PRN Meds:.acetaminophen **OR** acetaminophen, diphenhydrAMINE, ipratropium-albuterol, LORazepam, morphine injection, ondansetron **OR** ondansetron (ZOFRAN) IV, oxyCODONE, zolpidem  Antibiotics: Anti-infectives    Start     Dose/Rate Route Frequency Ordered Stop   08/04/15 1400  ertapenem (INVANZ) 1 g in sodium chloride 0.9 % 50 mL IVPB     1 g 100 mL/hr over 30 Minutes Intravenous Every 24 hours 08/04/15 1319 08/04/15 1516   08/03/15 0800  [MAR Hold]  ertapenem (INVANZ) 1 g in sodium chloride 0.9 % 50 mL IVPB     (MAR Hold since 08/03/15 0831)   1 g 100 mL/hr over 30 Minutes Intravenous 30 min pre-op 08/02/15 1115 08/03/15 1005       Assessment/Plan Rectal polyp POD#4 transanal minimally invasive surgery to resect rectal polyp Flexible sigmoidoscopy---Dr. 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 i have strongly encouraged the patient to follow up oncology. He was previously supposed to see Dr. Annamaria Boots.  Dispo-stable for DC from a surgical standpoint.    Erby Pian, [facility] Surgery Pager [phone]-[phone])   08/07/2015 10:25 AM

## 2015-08-07 NOTE — Clinical Social Work Placement (Signed)
CSW received consult for SNF placement/multiple readmissions. CSW met with patient who is agreeable with plan for SNF, states that he's been to SNF before though could not recall the name. CSW noted emergency contact listed for patient "Dona Ana - relation: Legal Guardian phone: [phone]". CSW called & spoke with Shanon Brow at the [facility] who informed CSW that patient is his own guardian and that he had discharged from their center over a year ago.   CSW completed FL2 and sent information out to [facility] - will follow-up with bed offers. Anticipating possible discharge today.    Raynaldo Opitz, [facility] Clinical Social Worker cell #: [phone]   CLINICAL SOCIAL WORK PLACEMENT  NOTE  Date:  08/07/2015  Patient Details  Name: [name] MRN: [id number] Date of Birth: [birth year]  Clinical Social Work is seeking post-discharge placement for this patient at the [place] level of care (*CSW will initial, date and re-position this form in  chart as items are completed):  Yes   Patient/family provided with [place] Work Department's list of facilities offering this level of care within the geographic area requested by the patient (or if unable, by the patient's family).  Yes   Patient/family informed of their freedom to choose among providers that offer the needed level of care, that participate in Medicare, Medicaid or managed care program needed by the patient, have an available bed and are willing to accept the patient.  Yes   Patient/family informed of [facility]'s ownership interest in [facility] and [facility], as well as of the fact that they are under no obligation to receive care at these facilities.  PASRR submitted to EDS on 08/07/15     PASRR number received on 08/07/15     Existing PASRR number confirmed on       FL2 transmitted to all facilities in geographic  area requested by pt/family on 08/07/15     FL2 transmitted to all facilities within larger geographic area on       Patient informed that his/her managed care company has contracts with or will negotiate with certain facilities, including the following:            Patient/family informed of bed offers received.  Patient chooses bed at       Physician recommends and patient chooses bed at      Patient to be transferred to   on  .  Patient to be transferred to facility by       Patient family notified on   of transfer.  Name of family member notified:        PHYSICIAN       Additional Comment:    _______________________________________________ Standley Brooking, LCSW 08/07/2015, 11:53 AM

## 2015-08-07 NOTE — Clinical Social Work Placement (Signed)
Patient is set to discharge to [facility] today. Patient aware. Discharge packet given to RN, Shanon Brow. [facility] called for transport.     Raynaldo Opitz, [facility] Clinical Social Worker cell #: [phone]    CLINICAL SOCIAL WORK PLACEMENT  NOTE  Date:  08/07/2015  Patient Details  Name: [name] MRN: [id number] Date of Birth: 15-May-1946  Clinical Social Work is seeking post-discharge placement for this patient at the [place] level of care (*CSW will initial, date and re-position this form in  chart as items are completed):  Yes   Patient/family provided with [place] Work Department's list of facilities offering this level of care within the geographic area requested by the patient (or if unable, by the patient's family).  Yes   Patient/family informed of their freedom to choose among providers that offer the needed level of care, that participate in Medicare, Medicaid or managed care program needed by the patient, have an available bed and are willing to accept the patient.  Yes   Patient/family informed of 's ownership interest in [facility] and [facility], as well as of the fact that they are under no obligation to receive care at these facilities.  PASRR submitted to EDS on 08/07/15     PASRR number received on 08/07/15     Existing PASRR number confirmed on       FL2 transmitted to all facilities in geographic area requested by pt/family on 08/07/15     FL2 transmitted to all facilities within larger geographic area on       Patient informed that his/her managed care company has contracts with or will negotiate with certain facilities, including the following:        Yes   Patient/family informed of bed offers received.  Patient chooses bed at [facility]     Physician recommends and patient chooses bed at      Patient to be transferred to [facility] on  08/07/15.  Patient to be transferred to facility by [facility]     Patient family notified on 08/07/15 of transfer.  Name of family member notified:        PHYSICIAN       Additional Comment:    _______________________________________________ Standley Brooking, LCSW 08/07/2015, 3:39 PM

## 2015-08-07 NOTE — Discharge Instructions (Signed)
Make appointment with your oncology doctor for a hospital follow up. Gastrointestinal Bleeding Gastrointestinal bleeding is bleeding somewhere along the path that food travels through the body (digestive tract). This path is anywhere between the mouth and the opening of the butt (anus). You may have blood in your throw up (vomit) or in your poop (stools). If there is a lot of bleeding, you may need to stay in the hospital. [place]  Only take medicine as told by your doctor.  Eat foods with fiber such as whole grains, fruits, and vegetables. You can also try eating 1 to 3 prunes a day.  Drink enough fluids to keep your pee (urine) clear or pale yellow. GET HELP RIGHT AWAY IF:   Your bleeding gets worse.  You feel dizzy, weak, or you pass out (faint).  You have bad cramps in your back or belly (abdomen).  You have large blood clumps (clots) in your poop.  Your problems are getting worse. MAKE SURE YOU:   Understand these instructions.  Will watch your condition.  Will get help right away if you are not doing well or get worse.   This information is not intended to replace advice given to you by your health care provider. Make sure you discuss any questions you have with your health care provider.   Document Released: 03/05/2008 Document Revised: 05/13/2012 Document Reviewed: 11/14/2014 Elsevier Interactive Patient Education [employer].

## 2015-08-11 ENCOUNTER — Encounter: Payer: Self-pay | Admitting: Internal Medicine

## 2015-08-20 ENCOUNTER — Encounter (HOSPITAL_COMMUNITY): Payer: Self-pay | Admitting: Emergency Medicine

## 2015-08-20 ENCOUNTER — Emergency Department (HOSPITAL_COMMUNITY)
Admission: EM | Admit: 2015-08-20 | Discharge: 2015-08-20 | Disposition: A | Discharge status: Home / Self Care | Payer: Medicare Other | Attending: Emergency Medicine | Admitting: Emergency Medicine

## 2015-08-20 DIAGNOSIS — Z8572 Personal history of non-Hodgkin lymphomas: Secondary | ICD-10-CM | POA: Diagnosis not present

## 2015-08-20 DIAGNOSIS — Z8701 Personal history of pneumonia (recurrent): Secondary | ICD-10-CM | POA: Insufficient documentation

## 2015-08-20 DIAGNOSIS — I25119 Atherosclerotic heart disease of native coronary artery with unspecified angina pectoris: Secondary | ICD-10-CM | POA: Diagnosis not present

## 2015-08-20 DIAGNOSIS — R109 Unspecified abdominal pain: Secondary | ICD-10-CM | POA: Insufficient documentation

## 2015-08-20 DIAGNOSIS — I252 Old myocardial infarction: Secondary | ICD-10-CM | POA: Insufficient documentation

## 2015-08-20 DIAGNOSIS — Z8781 Personal history of (healed) traumatic fracture: Secondary | ICD-10-CM | POA: Diagnosis not present

## 2015-08-20 DIAGNOSIS — Z79899 Other long term (current) drug therapy: Secondary | ICD-10-CM | POA: Insufficient documentation

## 2015-08-20 DIAGNOSIS — K625 Hemorrhage of anus and rectum: Secondary | ICD-10-CM | POA: Diagnosis present

## 2015-08-20 DIAGNOSIS — Z86018 Personal history of other benign neoplasm: Secondary | ICD-10-CM | POA: Diagnosis not present

## 2015-08-20 DIAGNOSIS — I482 Chronic atrial fibrillation: Secondary | ICD-10-CM | POA: Diagnosis not present

## 2015-08-20 DIAGNOSIS — I10 Essential (primary) hypertension: Secondary | ICD-10-CM | POA: Insufficient documentation

## 2015-08-20 DIAGNOSIS — Z9049 Acquired absence of other specified parts of digestive tract: Secondary | ICD-10-CM | POA: Insufficient documentation

## 2015-08-20 DIAGNOSIS — Z9181 History of falling: Secondary | ICD-10-CM | POA: Diagnosis not present

## 2015-08-20 DIAGNOSIS — Z87438 Personal history of other diseases of male genital organs: Secondary | ICD-10-CM | POA: Diagnosis not present

## 2015-08-20 DIAGNOSIS — Z8739 Personal history of other diseases of the musculoskeletal system and connective tissue: Secondary | ICD-10-CM | POA: Diagnosis not present

## 2015-08-20 DIAGNOSIS — F17210 Nicotine dependence, cigarettes, uncomplicated: Secondary | ICD-10-CM | POA: Diagnosis not present

## 2015-08-20 LAB — CBC
HCT: 31.6 % — ABNORMAL LOW (ref 39.0–52.0)
HEMOGLOBIN: 10 g/dL — AB (ref 13.0–17.0)
MCH: 28.5 pg (ref 26.0–34.0)
MCHC: 31.6 g/dL (ref 30.0–36.0)
MCV: 90 fL (ref 78.0–100.0)
PLATELETS: 357 10*3/uL (ref 150–400)
RBC: 3.51 MIL/uL — ABNORMAL LOW (ref 4.22–5.81)
RDW: 15.4 % (ref 11.5–15.5)
WBC: 11.9 10*3/uL — ABNORMAL HIGH (ref 4.0–10.5)

## 2015-08-20 LAB — URINALYSIS, ROUTINE W REFLEX MICROSCOPIC
Bilirubin Urine: NEGATIVE
GLUCOSE, UA: NEGATIVE mg/dL
HGB URINE DIPSTICK: NEGATIVE
KETONES UR: NEGATIVE mg/dL
LEUKOCYTES UA: NEGATIVE
Nitrite: NEGATIVE
PH: 5.5 (ref 5.0–8.0)
Protein, ur: NEGATIVE mg/dL
Specific Gravity, Urine: 1.014 (ref 1.005–1.030)

## 2015-08-20 LAB — COMPREHENSIVE METABOLIC PANEL
ALBUMIN: 3.1 g/dL — AB (ref 3.5–5.0)
ALK PHOS: 121 U/L (ref 38–126)
ALT: 17 U/L (ref 17–63)
AST: 18 U/L (ref 15–41)
Anion gap: 8 (ref 5–15)
BILIRUBIN TOTAL: 0.1 mg/dL — AB (ref 0.3–1.2)
BUN: 13 mg/dL (ref 6–20)
CALCIUM: 9.1 mg/dL (ref 8.9–10.3)
CO2: 27 mmol/L (ref 22–32)
Chloride: 103 mmol/L (ref 101–111)
Creatinine, Ser: 0.66 mg/dL (ref 0.61–1.24)
GFR calc non Af Amer: >60 mL/min (ref >60)
GLUCOSE: 107 mg/dL — AB (ref 65–99)
Potassium: 4.2 mmol/L (ref 3.5–5.1)
Sodium: 138 mmol/L (ref 135–145)
TOTAL PROTEIN: 7.3 g/dL (ref 6.5–8.1)

## 2015-08-20 LAB — TYPE AND SCREEN
ABO/RH(D): O POS
Antibody Screen: NEGATIVE

## 2015-08-20 LAB — POC OCCULT BLOOD, ED: Fecal Occult Bld: NEGATIVE

## 2015-08-20 NOTE — ED Notes (Signed)
Communications called to arrange for [facility] transport to [facility].

## 2015-08-20 NOTE — ED Notes (Signed)
Pt had palpable hemorrhoids but stool was orange in color. Noted multiple scattered open areas around rectum. Pt reported that it was from having multiple loose stools.

## 2015-08-20 NOTE — Discharge Instructions (Signed)
You were evaluated in the ED today for rectal bleeding. Your exam was reassuring. Your lab work was also reassuring. Your blood test in your stool was negative and shows no active bleeding at this time. It is important for you to follow-up with your primary care doctor and/or surgeon next week for reevaluation. Return to ED for any new or worsening symptoms.

## 2015-08-20 NOTE — ED Notes (Signed)
Bed: [id number] Expected date:  Expected time:  Means of arrival:  Comments: EMS- 70 yo abd pain

## 2015-08-20 NOTE — ED Notes (Signed)
Awake. Verbally responsive. A/O x4. Resp even and unlabored. No audible adventitious breath sounds noted. ABC's intact.  

## 2015-08-20 NOTE — ED Notes (Signed)
Pt arrived via EMS from [facility] with report of bright red rectal bleeding with BM, abd pain, and poor appetite for 1 month but pt has been drinking Ensure. Pt denies n/v, fever, and GU symptoms

## 2015-08-20 NOTE — ED Notes (Signed)
Pt reported that he was hungry and requesting chicken livers and hamburger to eat. Pt reminded that his c/o poor appetite upon arrival and that he has been drinking Ensures at facility. Pt reported that he thinks he could eat something now.

## 2015-08-20 NOTE — ED Provider Notes (Signed)
CSN: [id number]     Arrival date & time 08/20/15  1136 History   First MD Initiated Contact with Patient 08/20/15 1226     Chief Complaint  Patient presents with  . Rectal Bleeding     (Consider location/radiation/quality/duration/timing/severity/associated sxs/prior Treatment) HPI PIO STADTLANDER is a 70 y.o. male comes in for evaluation of rectal bleeding. Patient reports he had surgery last week to remove a polyp. He is overall a poor historian. Reports chronic chest pain, shortness of breath and abdominal discomfort that are all unchanged from baseline. Reports he has used the bathroom "25 times today". No fevers, chills, urinary symptoms, numbness or weakness, dizziness. No other modifying factors  Past Medical History  Diagnosis Date  . Depression   . Fall at nursing home     December 2014  . History of migraine   . Gout attack   . Complete heart block ([place])     St. Jude pacemaker - followed by Dr. Lovena Le  . Chronic atrial fibrillation ([place])   . HLD (hyperlipidemia)   . GIB (gastrointestinal bleeding)   . BPH (benign prostatic hyperplasia)   . Essential hypertension   . Gastric ulcer   . Non Hodgkin's lymphoma ([place])   . Tubular adenoma of colon   . Aspiration pneumonia ([place]) 10/07/2014  . 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 Atypical angina ([place]) 12/01/2014  . BLOCK, AV, COMPLETE 12/24/2006    Annotation: Pacer placed Qualifier: Diagnosis of  By: Hilma Favors  DO, Beth    . 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 Fracture femur, cervicotrochanteric ([place]) 05/27/2013   Past Surgical History  Procedure Laterality Date  . Pacemaker generator change      Dr. Lovena Le  . Appendectomy    . Cholecystectomy    . Open reduction of hip Right 05/28/2013    Procedure: OPEN REDUCTION OF HIP;  Surgeon: Renette Butters, MD;  Location: [place];  Service:  Orthopedics;  Laterality: Right;  . Esophagogastroduodenoscopy (egd) with propofol N/A 09/26/2014    Procedure: ESOPHAGOGASTRODUODENOSCOPY (EGD) WITH PROPOFOL;  Surgeon: Jerene Bears, MD;  Location: [facility] ENDOSCOPY;  Service: Endoscopy;  Laterality: N/A;  . Colonoscopy N/A 10/01/2014    Procedure: COLONOSCOPY;  Surgeon: Jerene Bears, MD;  Location: [facility] ENDOSCOPY;  Service: Endoscopy;  Laterality: N/A;  . Partial colectomy N/A 10/05/2014    Procedure: SUBTOTAL COLECTOMY WITH ILEORECTAL ANASTOMOSIS;  Surgeon: Donnie Mesa, MD;  Location: [place];  Service: General;  Laterality: N/A;  . Flexible sigmoidoscopy N/A 11/07/2014    Procedure: FLEXIBLE SIGMOIDOSCOPY;  Surgeon: Jerene Bears, MD;  Location: [facility] ENDOSCOPY;  Service: Endoscopy;  Laterality: N/A;  . Gastric resection    . Flexible sigmoidoscopy N/A 07/30/2015    Procedure: FLEXIBLE SIGMOIDOSCOPY;  Surgeon: Doran Stabler, MD;  Location: [facility] ENDOSCOPY;  Service: Endoscopy;  Laterality: N/A;  . Transanal excision of rectal mass N/A 08/03/2015    Procedure: TRANSANAL MINIMALLY INVASIVE SURGERY TO RESECT RECTAL POLYP;  Surgeon: [name], MD;  Location: [facility] ORS;  Service: General;  Laterality: N/A;  . Flexible sigmoidoscopy N/A 08/03/2015    Procedure: FLEXIBLE SIGMOIDOSCOPY;  Surgeon: [name], MD;  Location: [facility] ORS;  Service: General;  Laterality: N/A;   Family History  Problem Relation Age of Onset  . Colon cancer Mother  Social History  Substance Use Topics  . Smoking status: Current Every Day Smoker -- 0.50 packs/day for 66 years    Types: Cigarettes  . Smokeless tobacco: Former Systems developer    Types: Snuff, Chew     Comment: 05/27/2013 "haven't used chew or snuff in ~ 30 yr"  . Alcohol Use: 0.0 oz/week    0 Standard drinks or equivalent per week     Comment: "I drink a fifth of bourbon a month"    Review of Systems A 10 point review of systems was completed and was negative except for pertinent positives and negatives as mentioned in  the history of present illness     Allergies  Review of patient's allergies indicates no known allergies.  Home Medications   Prior to Admission medications   Medication Sig Start Date End Date Taking? Authorizing Provider  albuterol (PROVENTIL HFA;VENTOLIN HFA) 108 (90 Base) MCG/ACT inhaler Inhale 2 puffs into the lungs every 4 (four) hours as needed for wheezing or shortness of breath. 08/07/15   Venetia Maxon Rama, MD  carvedilol (COREG) 6.25 MG tablet Take 1 tablet (6.25 mg total) by mouth 2 (two) times daily with a meal. 11/16/14   Velvet Bathe, MD  feeding supplement, ENSURE ENLIVE, (ENSURE ENLIVE) LIQD Take 237 mLs by mouth 2 (two) times daily between meals. 08/07/15   Venetia Maxon Rama, MD  oxyCODONE (OXY IR/ROXICODONE) 5 MG immediate release tablet Take 1 tablet (5 mg total) by mouth every 6 (six) hours as needed for severe pain. 08/07/15   Christina P Rama, MD   BP 104/69 mmHg  Pulse 105  Temp(Src) 98.2 F (36.8 C) (Oral)  Resp 18  SpO2 95% Physical Exam  Constitutional: He is oriented to person, place, and time. He appears well-developed and well-nourished.  HENT:  Head: Normocephalic and atraumatic.  Mouth/Throat: Oropharynx is clear and moist.  Eyes: Conjunctivae are normal. Pupils are equal, round, and reactive to light. Right eye exhibits no discharge. Left eye exhibits no discharge. No scleral icterus.  Neck: Neck supple.  Cardiovascular: Normal rate, regular rhythm and normal heart sounds.   Pulmonary/Chest: Effort normal and breath sounds normal. No respiratory distress. He has no wheezes. He has no rales.  Abdominal: Soft. There is no tenderness.  Musculoskeletal: He exhibits no tenderness.  Neurological: He is alert and oriented to person, place, and time.  Cranial Nerves II-XII grossly intact  Skin: Skin is warm and dry. No rash noted.  Psychiatric: He has a normal mood and affect.  Nursing note and vitals reviewed.   ED Course  Procedures (including critical  care time) Labs Review Labs Reviewed  COMPREHENSIVE METABOLIC PANEL - Abnormal; Notable for the following:    Glucose, Bld 107 (*)    Albumin 3.1 (*)    Total Bilirubin 0.1 (*)    All other components within normal limits  CBC - Abnormal; Notable for the following:    WBC 11.9 (*)    RBC 3.51 (*)    Hemoglobin 10.0 (*)    HCT 31.6 (*)    All other components within normal limits  URINALYSIS, ROUTINE W REFLEX MICROSCOPIC (NOT AT [facility])  POC OCCULT BLOOD, ED  TYPE AND SCREEN    Imaging Review No results found. I have personally reviewed and evaluated these images and lab results as part of my medical decision-making.   EKG Interpretation None     Filed Vitals:   08/20/15 1300 08/20/15 1330 08/20/15 1400 08/20/15 1456  BP: 130/85 115/72 104/69  104/69  Pulse: 104   105  Temp:    98.2 F (36.8 C)  TempSrc:    Oral  Resp: 18 17 16 18   SpO2: 94%   95%    MDM  MARLOW GUARD is a 70 y.o. male who presents for evaluation of rectal bleeding. Patient is followed by surgery for rectal polyp assumed to be source of rectal bleeding. He is afebrile and hemodynamically stable in the emergency department today. Physical exam is unremarkable. Labs are baseline for patient and fecal occult blood test is negative. Patient elopes prior to discussion of ED course and results. Discussed with my attending, Dr. Rex Kras. Final diagnoses:  Abdominal discomfort       Comer Locket, PA-C 08/20/15 [place], PA-C 08/20/15 [place], MD 08/29/15 [phone]

## 2015-08-20 NOTE — ED Notes (Signed)
[place] to give report and no one answers the phone.

## 2015-09-01 ENCOUNTER — Emergency Department (HOSPITAL_COMMUNITY)
Admission: EM | Admit: 2015-09-01 | Discharge: 2015-09-01 | Disposition: A | Discharge status: Home / Self Care | Payer: Medicare Other | Source: Home / Self Care | Attending: Emergency Medicine | Admitting: Emergency Medicine

## 2015-09-01 ENCOUNTER — Emergency Department (HOSPITAL_COMMUNITY)
Admission: EM | Admit: 2015-09-01 | Discharge: 2015-09-01 | Discharge status: Against Medical Advice | Payer: Medicare Other | Attending: Emergency Medicine | Admitting: Emergency Medicine

## 2015-09-01 ENCOUNTER — Emergency Department (HOSPITAL_COMMUNITY): Payer: Medicare Other

## 2015-09-01 ENCOUNTER — Encounter (HOSPITAL_COMMUNITY): Payer: Self-pay

## 2015-09-01 ENCOUNTER — Encounter (HOSPITAL_COMMUNITY): Payer: Self-pay | Admitting: Emergency Medicine

## 2015-09-01 DIAGNOSIS — Z8781 Personal history of (healed) traumatic fracture: Secondary | ICD-10-CM | POA: Insufficient documentation

## 2015-09-01 DIAGNOSIS — I482 Chronic atrial fibrillation, unspecified: Secondary | ICD-10-CM

## 2015-09-01 DIAGNOSIS — Z8639 Personal history of other endocrine, nutritional and metabolic disease: Secondary | ICD-10-CM

## 2015-09-01 DIAGNOSIS — R079 Chest pain, unspecified: Secondary | ICD-10-CM

## 2015-09-01 DIAGNOSIS — Z86018 Personal history of other benign neoplasm: Secondary | ICD-10-CM | POA: Diagnosis not present

## 2015-09-01 DIAGNOSIS — R05 Cough: Secondary | ICD-10-CM | POA: Insufficient documentation

## 2015-09-01 DIAGNOSIS — I10 Essential (primary) hypertension: Secondary | ICD-10-CM

## 2015-09-01 DIAGNOSIS — F17210 Nicotine dependence, cigarettes, uncomplicated: Secondary | ICD-10-CM | POA: Insufficient documentation

## 2015-09-01 DIAGNOSIS — I208 Other forms of angina pectoris: Secondary | ICD-10-CM | POA: Insufficient documentation

## 2015-09-01 DIAGNOSIS — Z8659 Personal history of other mental and behavioral disorders: Secondary | ICD-10-CM

## 2015-09-01 DIAGNOSIS — Z5900 Homelessness unspecified: Secondary | ICD-10-CM

## 2015-09-01 DIAGNOSIS — Z8701 Personal history of pneumonia (recurrent): Secondary | ICD-10-CM | POA: Diagnosis not present

## 2015-09-01 DIAGNOSIS — Z87828 Personal history of other (healed) physical injury and trauma: Secondary | ICD-10-CM

## 2015-09-01 DIAGNOSIS — Z79899 Other long term (current) drug therapy: Secondary | ICD-10-CM | POA: Insufficient documentation

## 2015-09-01 DIAGNOSIS — I209 Angina pectoris, unspecified: Secondary | ICD-10-CM | POA: Insufficient documentation

## 2015-09-01 DIAGNOSIS — R0602 Shortness of breath: Secondary | ICD-10-CM | POA: Insufficient documentation

## 2015-09-01 DIAGNOSIS — I499 Cardiac arrhythmia, unspecified: Secondary | ICD-10-CM

## 2015-09-01 DIAGNOSIS — Z87438 Personal history of other diseases of male genital organs: Secondary | ICD-10-CM

## 2015-09-01 DIAGNOSIS — L988 Other specified disorders of the skin and subcutaneous tissue: Secondary | ICD-10-CM

## 2015-09-01 DIAGNOSIS — Z8739 Personal history of other diseases of the musculoskeletal system and connective tissue: Secondary | ICD-10-CM | POA: Insufficient documentation

## 2015-09-01 DIAGNOSIS — Z8572 Personal history of non-Hodgkin lymphomas: Secondary | ICD-10-CM

## 2015-09-01 DIAGNOSIS — Z8719 Personal history of other diseases of the digestive system: Secondary | ICD-10-CM | POA: Insufficient documentation

## 2015-09-01 DIAGNOSIS — R000 Tachycardia, unspecified: Secondary | ICD-10-CM

## 2015-09-01 LAB — I-STAT TROPONIN, ED: TROPONIN I, POC: 0.01 ng/mL (ref 0.00–0.08)

## 2015-09-01 LAB — CBC
HCT: 33.2 % — ABNORMAL LOW (ref 39.0–52.0)
HEMOGLOBIN: 10.2 g/dL — AB (ref 13.0–17.0)
MCH: 28.1 pg (ref 26.0–34.0)
MCHC: 30.7 g/dL (ref 30.0–36.0)
MCV: 91.5 fL (ref 78.0–100.0)
Platelets: 337 10*3/uL (ref 150–400)
RBC: 3.63 MIL/uL — AB (ref 4.22–5.81)
RDW: 16.5 % — ABNORMAL HIGH (ref 11.5–15.5)
WBC: 10.1 10*3/uL (ref 4.0–10.5)

## 2015-09-01 LAB — BASIC METABOLIC PANEL
ANION GAP: 10 (ref 5–15)
BUN: 24 mg/dL — ABNORMAL HIGH (ref 6–20)
CHLORIDE: 107 mmol/L (ref 101–111)
CO2: 23 mmol/L (ref 22–32)
Calcium: 9.2 mg/dL (ref 8.9–10.3)
Creatinine, Ser: 0.77 mg/dL (ref 0.61–1.24)
GFR calc non Af Amer: >60 mL/min (ref >60)
Glucose, Bld: 132 mg/dL — ABNORMAL HIGH (ref 65–99)
POTASSIUM: 4.2 mmol/L (ref 3.5–5.1)
SODIUM: 140 mmol/L (ref 135–145)

## 2015-09-01 MED ORDER — SODIUM CHLORIDE 0.9 % IV BOLUS (SEPSIS)
1000.0000 mL | Freq: Once | INTRAVENOUS | Status: AC
  Administered 2015-09-01: 1000 mL via INTRAVENOUS

## 2015-09-01 NOTE — ED Notes (Signed)
Patient continues to remove equipment and rummage through ED equipment and drawers.

## 2015-09-01 NOTE — ED Provider Notes (Signed)
CSN: [id number]     Arrival date & time 09/01/15  1301 History   First MD Initiated Contact with Patient 09/01/15 1311     Chief Complaint  Patient presents with  . Chest Pain  . Shortness of Breath    (Consider location/radiation/quality/duration/timing/severity/associated sxs/prior Treatment) Patient is a 70 y.o. male presenting with chest pain and shortness of breath. The history is provided by the patient and medical records. No language interpreter was used.  Chest Pain Associated symptoms: cough   Associated symptoms: no dizziness, no fever, no nausea, no palpitations, no shortness of breath and not vomiting   Shortness of Breath Associated symptoms: chest pain and cough   Associated symptoms: no fever and no vomiting      [name] is a 70 y.o. male  with a PMH of afib, CAD, HLD, HTN, pacemaker in place who presents to the Emergency Department complaining of sharp intermittent chest pain which began at 9am this morning. Patient states pain lasts approximately 10 minutes. No medications taken prior to arrival for symptoms. Patient denies shortness of breath, however per nursing chart review he was given albuterol for complaint of sob en route to Emergency Department.  Past Medical History  Diagnosis Date  . Depression   . Fall at nursing home     December 2014  . History of migraine   . Gout attack   . Complete heart block ([place])     St. Jude pacemaker - followed by Dr. Lovena Le  . Chronic atrial fibrillation ([place])   . HLD (hyperlipidemia)   . GIB (gastrointestinal bleeding)   . BPH (benign prostatic hyperplasia)   . Essential hypertension   . Gastric ulcer   . Non Hodgkin's lymphoma ([place])   . Tubular adenoma of colon   . Aspiration pneumonia ([place]) 10/07/2014  . 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 Atypical angina ([place]) 12/01/2014  . BLOCK, AV, COMPLETE 12/24/2006    Annotation: Pacer placed Qualifier: Diagnosis of   By: Hilma Favors  DO, Beth    . 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 Fracture femur, cervicotrochanteric ([place]) 05/27/2013   Past Surgical History  Procedure Laterality Date  . Pacemaker generator change      Dr. Lovena Le  . Appendectomy    . Cholecystectomy    . Open reduction of hip Right 05/28/2013    Procedure: OPEN REDUCTION OF HIP;  Surgeon: Renette Butters, MD;  Location: [place];  Service: Orthopedics;  Laterality: Right;  . Esophagogastroduodenoscopy (egd) with propofol N/A 09/26/2014    Procedure: ESOPHAGOGASTRODUODENOSCOPY (EGD) WITH PROPOFOL;  Surgeon: Jerene Bears, MD;  Location: [facility] ENDOSCOPY;  Service: Endoscopy;  Laterality: N/A;  . Colonoscopy N/A 10/01/2014    Procedure: COLONOSCOPY;  Surgeon: Jerene Bears, MD;  Location: [facility] ENDOSCOPY;  Service: Endoscopy;  Laterality: N/A;  . Partial colectomy N/A 10/05/2014    Procedure: SUBTOTAL COLECTOMY WITH ILEORECTAL ANASTOMOSIS;  Surgeon: Donnie Mesa, MD;  Location: [place];  Service: General;  Laterality: N/A;  . Flexible sigmoidoscopy N/A 11/07/2014    Procedure: FLEXIBLE SIGMOIDOSCOPY;  Surgeon: Jerene Bears, MD;  Location: [facility] ENDOSCOPY;  Service: Endoscopy;  Laterality: N/A;  . Gastric resection    . Flexible sigmoidoscopy N/A 07/30/2015    Procedure: FLEXIBLE SIGMOIDOSCOPY;  Surgeon: Doran Stabler, MD;  Location: [facility]  ENDOSCOPY;  Service: Endoscopy;  Laterality: N/A;  . Transanal excision of rectal mass N/A 08/03/2015    Procedure: TRANSANAL MINIMALLY INVASIVE SURGERY TO RESECT RECTAL POLYP;  Surgeon: [name], MD;  Location: [facility] ORS;  Service: General;  Laterality: N/A;  . Flexible sigmoidoscopy N/A 08/03/2015    Procedure: FLEXIBLE SIGMOIDOSCOPY;  Surgeon: [name], MD;  Location: [facility] ORS;  Service: General;  Laterality: N/A;   Family History  Problem Relation Age of Onset  . Colon cancer Mother    Social History  Substance Use  Topics  . Smoking status: Current Every Day Smoker -- 0.50 packs/day for 66 years    Types: Cigarettes  . Smokeless tobacco: Former Systems developer    Types: Snuff, Chew     Comment: 05/27/2013 "haven't used chew or snuff in ~ 30 yr"  . Alcohol Use: 0.0 oz/week    0 Standard drinks or equivalent per week     Comment: "I drink a fifth of bourbon a month"    Review of Systems  Constitutional: Negative for fever.  HENT: Negative for congestion.   Eyes: Negative for visual disturbance.  Respiratory: Positive for cough. Negative for shortness of breath.   Cardiovascular: Positive for chest pain. Negative for palpitations and leg swelling.  Gastrointestinal: Negative for nausea, vomiting and abdominal distention.  Genitourinary: Negative for dysuria.  Musculoskeletal: Negative for myalgias and arthralgias.  Skin: Negative for wound.  Neurological: Negative for dizziness.      Allergies  Review of patient's allergies indicates no known allergies.  Home Medications   Prior to Admission medications   Medication Sig Start Date End Date Taking? Authorizing Provider  albuterol (PROVENTIL HFA;VENTOLIN HFA) 108 (90 Base) MCG/ACT inhaler Inhale 2 puffs into the lungs every 4 (four) hours as needed for wheezing or shortness of breath. 08/07/15   Venetia Maxon Rama, MD  carvedilol (COREG) 6.25 MG tablet Take 1 tablet (6.25 mg total) by mouth 2 (two) times daily with a meal. 11/16/14   Velvet Bathe, MD  feeding supplement, ENSURE ENLIVE, (ENSURE ENLIVE) LIQD Take 237 mLs by mouth 2 (two) times daily between meals. 08/07/15   Venetia Maxon Rama, MD  oxyCODONE (OXY IR/ROXICODONE) 5 MG immediate release tablet Take 1 tablet (5 mg total) by mouth every 6 (six) hours as needed for severe pain. 08/07/15   Christina P Rama, MD   BP 125/85 mmHg  Pulse 116  Temp(Src) 98.8 F (37.1 C) (Oral)  Resp 20  Ht 5\' 9"  (1.753 m)  Wt 49.896 kg  BMI 16.24 kg/m2  SpO2 95% Physical Exam  Constitutional: He is oriented to person,  place, and time. He appears well-developed and well-nourished.  Alert and in no acute distress  HENT:  Head: Normocephalic and atraumatic.  Cardiovascular: Normal heart sounds and intact distal pulses.  Exam reveals no gallop and no friction rub.   No murmur heard. Tachycardia but regular  Pulmonary/Chest: Effort normal and breath sounds normal. No respiratory distress. He has no wheezes. He has no rales. He exhibits no tenderness.  Abdominal: Soft. Bowel sounds are normal. He exhibits no distension and no mass. There is no tenderness. There is no rebound and no guarding.  Musculoskeletal: He exhibits no edema.  Neurological: He is alert and oriented to person, place, and time.  Skin: Skin is warm and dry.  Nursing note and vitals reviewed.   ED Course  Procedures (including critical care time) Labs Review Labs Reviewed - No data to display  Imaging Review Dg Chest 2  View  09/01/2015  CLINICAL DATA:  Shortness of Breath EXAM: CHEST  2 VIEW COMPARISON:  07/30/2015 FINDINGS: Cardiac shadow is stable. Pacing device is again seen and stable. The lungs are clear bilaterally. No acute bony abnormality is seen. IMPRESSION: No active cardiopulmonary disease. Electronically Signed   By: Inez Catalina M.D.   On: 09/01/2015 13:57   I have personally reviewed and evaluated these images and lab results as part of my medical decision-making.   EKG Interpretation   Date/Time:  Friday September 01 2015 13:04:54 EDT Ventricular Rate:  100 PR Interval:    QRS Duration: 104 QT Interval:  336 QTC Calculation: 433 R Axis:   148 Text Interpretation:  Atrial fibrillation Right axis deviation Incomplete  right bundle branch block Septal infarct , age undetermined Abnormal ECG  since last tracing no significant change Confirmed by MILLER  MD, BRIAN  [phone]) on 09/01/2015 1:44:35 PM      MDM   Final diagnoses:  Chest pain, unspecified chest pain type   [name] presents for sharp intermittent  chest pain. He has been seen multiple times in the Emergency Department for the same. Care plan in place and reviewed. Chest x-ray was obtained which shows no acute cardiopulmonary disease. Labs including CBC, BMP, and troponin were ordered, however patient is requesting to leave AMA prior to labs being processed. Patient is followed by cardiology, stating he has seen them within the last 3 months. I have discussed my concerns as a provider. We discussed the nature, risks and benefitst. I have specifically discussed that without further evaluation I cannot guarantee there is not a life threatening event occuring.  Time was given to allow the opportunity to ask questions and consider the options, and after the discussion, the patient decided to refuse the offered treatment. Pt is A&Ox4, his own POA and states understanding of my concerns and the possible consequences. After refusal, I made every reasonable opportunity to treat them to the best of my ability, including recommending cardiology follow-up which patient agrees to doing so. I have made the patient aware that this is an [place] discharge, but he may return at any time for further evaluation and treatment.  [facility] [place] T[place], PA-C 09/01/15 1419  Noemi Chapel, MD 09/02/15 [phone]

## 2015-09-01 NOTE — ED Notes (Signed)
Doctor at bedside.

## 2015-09-01 NOTE — ED Notes (Addendum)
Pt brought in via [facility] due to c/o chronic chest pain. Pt was just discharged from here earlier today for same. Pt was found by [employer] rummaging through trash cans at [employer]. He revealed to [employer] that he was having CP so [employer] called EMS. Dr. Alvino Chapel called upon arrival and wants staff to obtain pts vital signs and one EKG.

## 2015-09-01 NOTE — ED Notes (Signed)
Pt in Triage.

## 2015-09-01 NOTE — ED Provider Notes (Signed)
The patient is a 69 year old male, he is chronically in the emergency department for either chest pain or shortness of breath. He comes in today by ambulance stating that he has been having a sharp chest pain as well as some shortness of breath however when I go to examine the patient he denies chest pain or shortness of breath. He has found a Betadine swab and has cleaned the right side of his temple where there is a small ulcer.  He has several more in his hand. The patient states that he does not want to be her anymore, he seems to have medical decision-making capacity, he states he does not have chest pain or shortness of breath. The patient is insistent on d/c and will be discharged [place].  Medical screening examination/treatment/procedure(s) were conducted as a shared visit with non-physician practitioner(s) and myself.  I personally evaluated the patient during the encounter.  Clinical Impression:   Final diagnoses:  None         Noemi Chapel, MD 09/02/15 [phone]

## 2015-09-01 NOTE — ED Notes (Signed)
Patient moved to hallway as he is not following directions while in room.

## 2015-09-01 NOTE — ED Notes (Signed)
Pt left room x 2w reporting He wants to go home . Pt A/O and ambulating independent with distress. DR Sabra Heck in room with pt to assess.

## 2015-09-01 NOTE — ED Provider Notes (Signed)
MSE was initiated and I personally evaluated the patient and placed orders (if any) at  6:24 PM on September 01, 2015.  The patient appears stable so that the remainder of the MSE may be completed by another provider.  Patient was initially seen by me for chest pain. States he's had some pain in his chest. Patient was looking into drawers and then urinated in the sink. His had chest pain visit from earlier today and was reassuring. Multiple prior visits for same. Doubt acute unstable illness that needs treatment here. Escorted off properly by security.  Davonna Belling, MD 09/01/15 [phone]

## 2015-09-01 NOTE — ED Notes (Signed)
Patient not in room yet 

## 2015-09-01 NOTE — ED Provider Notes (Signed)
CSN: [id number]     Arrival date & time 09/01/15  2027 History   First MD Initiated Contact with Patient 09/01/15 2114     Chief Complaint  Patient presents with  . Chest Pain     (Consider location/radiation/quality/duration/timing/severity/associated sxs/prior Treatment) The history is provided by the patient and medical records. No language interpreter was used.    [name] is a 70 y.o. male  Well known to the ED with multiple visits today and a hx of depression, chronic a-fib, chronic chest pain presents to the Emergency Department complaining of gradual, persistent, central chest pain, onset 4pm.  Record review shows multiple large work-ups in the past and normal CXR earlier today.  He is a poor historian and vague about his chest pain.  When I enter the room he is emptying the contents of the drawer into his pants pocket.  He denies cocaine usage.    Past Medical History  Diagnosis Date  . Depression   . Fall at nursing home     December 2014  . History of migraine   . Gout attack   . Complete heart block ([place])     St. Jude pacemaker - followed by Dr. Lovena Le  . Chronic atrial fibrillation ([place])   . HLD (hyperlipidemia)   . GIB (gastrointestinal bleeding)   . BPH (benign prostatic hyperplasia)   . Essential hypertension   . Gastric ulcer   . Non Hodgkin's lymphoma ([place])   . Tubular adenoma of colon   . Aspiration pneumonia ([place]) 10/07/2014  . 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 Atypical angina ([place]) 12/01/2014  . BLOCK, AV, COMPLETE 12/24/2006    Annotation: Pacer placed Qualifier: Diagnosis of  By: Hilma Favors  DO, Beth    . 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 Fracture femur, cervicotrochanteric ([place]) 05/27/2013   Past Surgical History  Procedure Laterality Date  . Pacemaker generator change      Dr. Lovena Le  . Appendectomy     . Cholecystectomy    . Open reduction of hip Right 05/28/2013    Procedure: OPEN REDUCTION OF HIP;  Surgeon: Renette Butters, MD;  Location: [place];  Service: Orthopedics;  Laterality: Right;  . Esophagogastroduodenoscopy (egd) with propofol N/A 09/26/2014    Procedure: ESOPHAGOGASTRODUODENOSCOPY (EGD) WITH PROPOFOL;  Surgeon: Jerene Bears, MD;  Location[facility] ENDOSCOPY;  Service: Endoscopy;  Laterality: N/A;  . Colonoscopy N/A 10/01/2014    Procedure: COLONOSCOPY;  Surgeon: Jerene Bears, MD;  Location: [facility] ENDOSCOPY;  Service: Endoscopy;  Laterality: N/A;  . Partial colectomy N/A 10/05/2014    Procedure: SUBTOTAL COLECTOMY WITH ILEORECTAL ANASTOMOSIS;  Surgeon: Donnie Mesa, MD;  Location: [place];  Service: General;  Laterality: N/A;  . Flexible sigmoidoscopy N/A 11/07/2014    Procedure: FLEXIBLE SIGMOIDOSCOPY;  Surgeon: Jerene Bears, MD;  Location: [facility] ENDOSCOPY;  Service: Endoscopy;  Laterality: N/A;  . Gastric resection    . Flexible sigmoidoscopy N/A 07/30/2015    Procedure: FLEXIBLE SIGMOIDOSCOPY;  Surgeon: Doran Stabler, MD;  Location: [facility] ENDOSCOPY;  Service: Endoscopy;  Laterality: N/A;  . Transanal excision of rectal mass N/A 08/03/2015    Procedure: TRANSANAL MINIMALLY INVASIVE SURGERY TO RESECT RECTAL POLYP;  Surgeon: [name], MD;  Location: [facility] ORS;  Service: General;  Laterality: N/A;  . Flexible sigmoidoscopy  N/A 08/03/2015    Procedure: FLEXIBLE SIGMOIDOSCOPY;  Surgeon: [name], MD;  Location: [facility] ORS;  Service: General;  Laterality: N/A;   Family History  Problem Relation Age of Onset  . Colon cancer Mother    Social History  Substance Use Topics  . Smoking status: Current Every Day Smoker -- 0.50 packs/day for 66 years    Types: Cigarettes  . Smokeless tobacco: Former Systems developer    Types: Snuff, Chew     Comment: 05/27/2013 "haven't used chew or snuff in ~ 30 yr"  . Alcohol Use: 0.0 oz/week    0 Standard drinks or equivalent per week     Comment: "I drink a fifth of  bourbon a month"    Review of Systems  Constitutional: Negative for fever, diaphoresis, appetite change, fatigue and unexpected weight change.  HENT: Negative for mouth sores.   Eyes: Negative for visual disturbance.  Respiratory: Negative for cough, chest tightness, shortness of breath and wheezing.   Cardiovascular: Positive for chest pain.  Gastrointestinal: Negative for nausea, vomiting, abdominal pain, diarrhea and constipation.  Endocrine: Negative for polydipsia, polyphagia and polyuria.  Genitourinary: Negative for dysuria, urgency, frequency and hematuria.  Musculoskeletal: Negative for back pain and neck stiffness.  Skin: Negative for rash.  Allergic/Immunologic: Negative for immunocompromised state.  Neurological: Negative for syncope, light-headedness and headaches.  Hematological: Does not bruise/bleed easily.  Psychiatric/Behavioral: Negative for sleep disturbance. The patient is not nervous/anxious.       Allergies  Review of patient's allergies indicates no known allergies.  Home Medications   Prior to Admission medications   Medication Sig Start Date End Date Taking? Authorizing Provider  albuterol (PROVENTIL HFA;VENTOLIN HFA) 108 (90 Base) MCG/ACT inhaler Inhale 2 puffs into the lungs every 4 (four) hours as needed for wheezing or shortness of breath. 08/07/15   Venetia Maxon Rama, MD  carvedilol (COREG) 6.25 MG tablet Take 1 tablet (6.25 mg total) by mouth 2 (two) times daily with a meal. 11/16/14   Velvet Bathe, MD  feeding supplement, ENSURE ENLIVE, (ENSURE ENLIVE) LIQD Take 237 mLs by mouth 2 (two) times daily between meals. 08/07/15   Venetia Maxon Rama, MD  oxyCODONE (OXY IR/ROXICODONE) 5 MG immediate release tablet Take 1 tablet (5 mg total) by mouth every 6 (six) hours as needed for severe pain. 08/07/15   Christina P Rama, MD   BP 119/59 mmHg  Pulse 97  Temp(Src) 98.9 F (37.2 C) (Oral)  Resp 18  SpO2 100% Physical Exam  Constitutional: He appears  well-developed and well-nourished. No distress.  Awake, alert, nontoxic appearance  HENT:  Head: Normocephalic and atraumatic.  Mouth/Throat: Oropharynx is clear and moist. No oropharyngeal exudate.  Eyes: Conjunctivae are normal. No scleral icterus.  Neck: Normal range of motion. Neck supple.  Cardiovascular: Intact distal pulses.  An irregularly irregular rhythm present. Tachycardia present.   Pulses:      Radial pulses are 2+ on the right side, and 2+ on the left side.  Pulmonary/Chest: Effort normal and breath sounds normal. No respiratory distress. He has no wheezes.  Equal chest expansion  Abdominal: Soft. Bowel sounds are normal. He exhibits no mass. There is no tenderness. There is no rebound and no guarding.  Musculoskeletal: Normal range of motion. He exhibits no edema.  Neurological: He is alert.  Speech is clear and goal oriented Moves extremities without ataxia  Skin: Skin is warm and dry. He is not diaphoretic.  Multiple open sores on his face, arms and legs; no cellulitis  Psychiatric: He has a normal mood and affect.  Nursing note and vitals reviewed.   ED Course  Procedures (including critical care time) Labs Review Labs Reviewed  CBC - Abnormal; Notable for the following:    RBC 3.63 (*)    Hemoglobin 10.2 (*)    HCT 33.2 (*)    RDW 16.5 (*)    All other components within normal limits  BASIC METABOLIC PANEL - Abnormal; Notable for the following:    Glucose, Bld 132 (*)    BUN 24 (*)    All other components within normal limits  Randolm Idol, ED    Imaging Review Dg Chest 2 View  09/01/2015  CLINICAL DATA:  Shortness of Breath EXAM: CHEST  2 VIEW COMPARISON:  07/30/2015 FINDINGS: Cardiac shadow is stable. Pacing device is again seen and stable. The lungs are clear bilaterally. No acute bony abnormality is seen. IMPRESSION: No active cardiopulmonary disease. Electronically Signed   By: Inez Catalina M.D.   On: 09/01/2015 13:57   I have personally  reviewed and evaluated these images and lab results as part of my medical decision-making.   EKG Interpretation   Date/Time:  Friday September 01 2015 20:35:17 EDT Ventricular Rate:  120 PR Interval:    QRS Duration: 98 QT Interval:  328 QTC Calculation: 463 R Axis:   102 Text Interpretation:  Atrial fibrillation with rapid ventricular response  Rightward axis Septal infarct , age undetermined Abnormal ECG Confirmed by  Alvino Chapel  MD, Ovid Curd [phone]) on 09/01/2015 8:53:06 PM      MDM   Final diagnoses:  Chest pain, unspecified chest pain type  Chronic atrial fibrillation (HCC)  Tachycardia   Modena Jansky Wenner presents with chest pain and tachycardia. Record review shows that he has a history of chronic A. fib but is not routinely tachycardic.  Labs are reassuring. Attempted IV and fluids. Patient got approximately 250 mL of fluid before pulling out his own IV. His tachycardia did resolve.  He is generally uncooperative and wishes to leave.  This time I doubt ACS.  He is instructed to return of his chest pain persists or becomes worse.    The patient was discussed with Dr. Alvino Chapel who agrees with the treatment plan.   Jarrett Soho Dietra Stokely, PA-C 09/02/15 0115  Davonna Belling, MD 09/02/15 [phone]

## 2015-09-01 NOTE — ED Notes (Signed)
Arrived via EMS shortness of breath and wheezing EMS administered albuterol 5mg .

## 2015-09-01 NOTE — ED Notes (Signed)
Pt was provided a sandwich and a drink.

## 2015-09-01 NOTE — ED Notes (Signed)
Pt arrives via [facility] reporting CP, pt states "hurts like it always does."  Pt deneis any change to chronic CP.  NAD noted at this time.

## 2015-09-01 NOTE — ED Notes (Signed)
Declined W/C at D/C and was escorted to lobby by RN. 

## 2015-09-01 NOTE — ED Notes (Signed)
PT was escorted to lobby by NT.

## 2015-09-01 NOTE — Discharge Instructions (Signed)
1. Medications: usual home medications 2. Treatment: rest, drink plenty of fluids,  3. Follow Up: Please followup with your primary doctor and cardiology in 3 days for discussion of your diagnoses and further evaluation after today's visit; if you do not have a primary care doctor use the resource guide provided to find one; Please return to the ER for worsening chest pain, SOB, syncope

## 2015-09-01 NOTE — ED Notes (Signed)
Patient states wants to leave and be discharged with a bus pass. Doctor notified.

## 2015-09-01 NOTE — ED Notes (Signed)
MD aware.of pt AMA

## 2015-09-01 NOTE — ED Notes (Addendum)
Spoke with doctor Pickering.  He advised to do EKG, Vitals and bring pt back to room.  Charge nurse notified

## 2015-09-01 NOTE — ED Notes (Signed)
PT advised to stay for exam but Pt refuses. Pt also advised to follow up with his cardiologist .

## 2015-09-01 NOTE — ED Notes (Signed)
Pt request a [place] sandwich  And a drink.

## 2015-09-01 NOTE — ED Notes (Signed)
Hannah, pa-c, at the bedside.  

## 2015-09-01 NOTE — ED Notes (Signed)
Pt is wandering around the pod and going to pod c. Pt has had to be brought back to room numerous times.

## 2015-09-01 NOTE — ED Notes (Signed)
Pt pulls monitor leads off and states he is leaving.

## 2015-09-01 NOTE — ED Notes (Signed)
Called Dr. Alvino Chapel in regards to patient status, interfering with care and pulling at equipment. Requested restraints. He acknowldges, awaiting orders.

## 2015-09-02 ENCOUNTER — Encounter (HOSPITAL_COMMUNITY): Payer: Self-pay | Admitting: Neurology

## 2015-09-02 ENCOUNTER — Emergency Department (HOSPITAL_COMMUNITY)
Admission: EM | Admit: 2015-09-02 | Discharge: 2015-09-02 | Discharge status: Against Medical Advice | Payer: Medicare Other | Attending: Emergency Medicine | Admitting: Emergency Medicine

## 2015-09-02 DIAGNOSIS — Z8781 Personal history of (healed) traumatic fracture: Secondary | ICD-10-CM | POA: Diagnosis not present

## 2015-09-02 DIAGNOSIS — I482 Chronic atrial fibrillation: Secondary | ICD-10-CM | POA: Diagnosis not present

## 2015-09-02 DIAGNOSIS — Z87438 Personal history of other diseases of male genital organs: Secondary | ICD-10-CM | POA: Insufficient documentation

## 2015-09-02 DIAGNOSIS — Z8659 Personal history of other mental and behavioral disorders: Secondary | ICD-10-CM | POA: Diagnosis not present

## 2015-09-02 DIAGNOSIS — I10 Essential (primary) hypertension: Secondary | ICD-10-CM | POA: Insufficient documentation

## 2015-09-02 DIAGNOSIS — Z8739 Personal history of other diseases of the musculoskeletal system and connective tissue: Secondary | ICD-10-CM | POA: Insufficient documentation

## 2015-09-02 DIAGNOSIS — Z8719 Personal history of other diseases of the digestive system: Secondary | ICD-10-CM | POA: Insufficient documentation

## 2015-09-02 DIAGNOSIS — Z85038 Personal history of other malignant neoplasm of large intestine: Secondary | ICD-10-CM | POA: Insufficient documentation

## 2015-09-02 DIAGNOSIS — F17210 Nicotine dependence, cigarettes, uncomplicated: Secondary | ICD-10-CM | POA: Diagnosis not present

## 2015-09-02 DIAGNOSIS — Z79899 Other long term (current) drug therapy: Secondary | ICD-10-CM | POA: Insufficient documentation

## 2015-09-02 DIAGNOSIS — R079 Chest pain, unspecified: Secondary | ICD-10-CM | POA: Diagnosis present

## 2015-09-02 DIAGNOSIS — Z8639 Personal history of other endocrine, nutritional and metabolic disease: Secondary | ICD-10-CM | POA: Insufficient documentation

## 2015-09-02 DIAGNOSIS — Z8701 Personal history of pneumonia (recurrent): Secondary | ICD-10-CM | POA: Diagnosis not present

## 2015-09-02 DIAGNOSIS — Z8572 Personal history of non-Hodgkin lymphomas: Secondary | ICD-10-CM | POA: Diagnosis not present

## 2015-09-02 LAB — I-STAT TROPONIN, ED: TROPONIN I, POC: 0 ng/mL (ref 0.00–0.08)

## 2015-09-02 MED ORDER — CARVEDILOL 12.5 MG PO TABS
6.2500 mg | ORAL_TABLET | Freq: Once | ORAL | Status: AC
  Administered 2015-09-02: 6.25 mg via ORAL
  Filled 2015-09-02: qty 1

## 2015-09-02 NOTE — ED Provider Notes (Signed)
CSN: [id number]     Arrival date & time 09/02/15  0741 History   First MD Initiated Contact with Patient 09/02/15 [phone]     Chief Complaint  Patient presents with  . Chest Pain     (Consider location/radiation/quality/duration/timing/severity/associated sxs/prior Treatment) HPI Comments: 70 year old male with extensive past medical history including atrial fibrillation, complete heart block status post pacemaker, cardiomyopathy, homelessness, frequent ED visits who presents with chest pain. The patient has presented here 3 times in the past 24 hours for chest pain, which he states is central, nonradiating, 8/10 in intensity. He was at a bus stop when he called EMS this morning, stating that his chest pain has not improved. He denies any radiation of the pain or associated shortness of breath, nausea, vomiting, or diaphoresis. He has received aspirin in transport.  Patient is a 70 y.o. male presenting with chest pain. The history is provided by the patient.  Chest Pain   Past Medical History  Diagnosis Date  . Depression   . Fall at nursing home     December 2014  . History of migraine   . Gout attack   . Complete heart block ([place])     St. Jude pacemaker - followed by Dr. Lovena Le  . Chronic atrial fibrillation ([place])   . HLD (hyperlipidemia)   . GIB (gastrointestinal bleeding)   . BPH (benign prostatic hyperplasia)   . Essential hypertension   . Gastric ulcer   . Non Hodgkin's lymphoma ([place])   . Tubular adenoma of colon   . Aspiration pneumonia ([place]) 10/07/2014  . 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 Atypical angina ([place]) 12/01/2014  . BLOCK, AV, COMPLETE 12/24/2006    Annotation: Pacer placed Qualifier: Diagnosis of  By: Hilma Favors  DO, Beth    . 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 Fracture femur, cervicotrochanteric ([place]) 05/27/2013    Past Surgical History  Procedure Laterality Date  . Pacemaker generator change      Dr. Lovena Le  . Appendectomy    . Cholecystectomy    . Open reduction of hip Right 05/28/2013    Procedure: OPEN REDUCTION OF HIP;  Surgeon: Renette Butters, MD;  Location: [place];  Service: Orthopedics;  Laterality: Right;  . Esophagogastroduodenoscopy (egd) with propofol N/A 09/26/2014    Procedure: ESOPHAGOGASTRODUODENOSCOPY (EGD) WITH PROPOFOL;  Surgeon: Jerene Bears, MD;  [facility] ENDOSCOPY;  Service: Endoscopy;  Laterality: N/A;  . Colonoscopy N/A 10/01/2014    Procedure: COLONOSCOPY;  Surgeon: Jerene Bears, MD;  Location: [facility] ENDOSCOPY;  Service: Endoscopy;  Laterality: N/A;  . Partial colectomy N/A 10/05/2014    Procedure: SUBTOTAL COLECTOMY WITH ILEORECTAL ANASTOMOSIS;  Surgeon: Donnie Mesa, MD;  Location: [place];  Service: General;  Laterality: N/A;  . Flexible sigmoidoscopy N/A 11/07/2014    Procedure: FLEXIBLE SIGMOIDOSCOPY;  Surgeon: Jerene Bears, MD;  Location: [facility] ENDOSCOPY;  Service: Endoscopy;  Laterality: N/A;  . Gastric resection    . Flexible sigmoidoscopy N/A 07/30/2015    Procedure: FLEXIBLE SIGMOIDOSCOPY;  Surgeon: Doran Stabler, MD;  Location: [facility] ENDOSCOPY;  Service: Endoscopy;  Laterality: N/A;  . Transanal excision of rectal mass N/A 08/03/2015    Procedure: TRANSANAL MINIMALLY INVASIVE SURGERY TO RESECT RECTAL POLYP;  Surgeon: [name], MD;  Location: [facility]S;  Service: General;  Laterality: N/A;  .  Flexible sigmoidoscopy N/A 08/03/2015    Procedure: FLEXIBLE SIGMOIDOSCOPY;  Surgeon: [name], MD;  Location: [facility] ORS;  Service: General;  Laterality: N/A;   Family History  Problem Relation Age of Onset  . Colon cancer Mother    Social History  Substance Use Topics  . Smoking status: Current Every Day Smoker -- 0.50 packs/day for 66 years    Types: Cigarettes  . Smokeless tobacco: Former Systems developer    Types: Snuff, Chew     Comment: 05/27/2013 "haven't used chew or snuff  in ~ 30 yr"  . Alcohol Use: 0.0 oz/week    0 Standard drinks or equivalent per week     Comment: "I drink a fifth of bourbon a month"    Review of Systems  Cardiovascular: Positive for chest pain.   10 Systems reviewed and are negative for acute change except as noted in the HPI.    Allergies  Review of patient's allergies indicates no known allergies.  Home Medications   Prior to Admission medications   Medication Sig Start Date End Date Taking? Authorizing Provider  albuterol (PROVENTIL HFA;VENTOLIN HFA) 108 (90 Base) MCG/ACT inhaler Inhale 2 puffs into the lungs every 4 (four) hours as needed for wheezing or shortness of breath. 08/07/15   Venetia Maxon Rama, MD  carvedilol (COREG) 6.25 MG tablet Take 1 tablet (6.25 mg total) by mouth 2 (two) times daily with a meal. 11/16/14   Velvet Bathe, MD  feeding supplement, ENSURE ENLIVE, (ENSURE ENLIVE) LIQD Take 237 mLs by mouth 2 (two) times daily between meals. 08/07/15   Venetia Maxon Rama, MD  oxyCODONE (OXY IR/ROXICODONE) 5 MG immediate release tablet Take 1 tablet (5 mg total) by mouth every 6 (six) hours as needed for severe pain. 08/07/15   Christina P Rama, MD   BP 143/67 mmHg  Pulse 90  Temp(Src) 97.5 F (36.4 C) (Oral)  Resp 18  SpO2 99% Physical Exam  Constitutional: He is oriented to person, place, and time. No distress.  Thin, chronically ill-appearing  HENT:  Head: Normocephalic and atraumatic.  Moist mucous membranes  Eyes: Conjunctivae are normal. Pupils are equal, round, and reactive to light.  Neck: Neck supple.  Cardiovascular: Normal rate and normal heart sounds.  An irregularly irregular rhythm present.  No murmur heard. Pulmonary/Chest: Effort normal. No respiratory distress.  Course breath sounds bilaterally with occasional expiratory wheeze  Abdominal: Soft. Bowel sounds are normal. He exhibits no distension. There is no tenderness.  Musculoskeletal: He exhibits no edema.  Neurological: He is alert and  oriented to person, place, and time.  Fluent speech  Skin: Skin is warm and dry.  Psychiatric: He has a normal mood and affect.  Nursing note and vitals reviewed.   ED Course  Procedures (including critical care time) Labs Review Labs Reviewed  Randolm Idol, ED    Imaging Review Dg Chest 2 View  09/01/2015  CLINICAL DATA:  Shortness of Breath EXAM: CHEST  2 VIEW COMPARISON:  07/30/2015 FINDINGS: Cardiac shadow is stable. Pacing device is again seen and stable. The lungs are clear bilaterally. No acute bony abnormality is seen. IMPRESSION: No active cardiopulmonary disease. Electronically Signed   By: Inez Catalina M.D.   On: 09/01/2015 13:57   I have personally reviewed and evaluated these lab results as part of my medical decision-making.   EKG Interpretation   Date/Time:  Saturday September 02 2015 07:48:46 EDT Ventricular Rate:  91 PR Interval:    QRS Duration: 102 QT Interval:  379  QTC Calculation: 466 R Axis:   169 Text Interpretation:  Atrial fibrillation Abnormal lateral Q waves  Anterior infarct, old Artifact in lead(s) I III aVL V1 Since last tracing  rate slower Otherwise no significant change Confirmed by Kaelon Weekes MD, Jakie Debow  [id number]) on 09/02/2015 8:03:13 AM     Medications  carvedilol (COREG) tablet 6.25 mg (not administered)    MDM   Final diagnoses:  None    Pt w/ frequent ED visits for pain, h/o A fib Who presents with ongoing chest pain that has not changed since discharge from the ED. He was chronically ill-appearing but comfortable at presentation. Atrial fibrillation on EKG, occasionally over 100 but the patient has not taken medication today. Gave his home dose of Coreg. I reviewed lab work from last night which was unremarkable.  EKG without ischemic changes. Chest x-ray last night was normal. I ordered a repeat troponin however the patient eloped.  Sharlett Iles, MD 09/02/15 [phone]

## 2015-09-02 NOTE — ED Notes (Addendum)
[place], called EMS for CP 8/10, no radiation, No n/v/d, no SOB. EKG AFIB. Given 324 aspirin. BP 132/78, HR 103. Was seen here yesterday x 3 for same thing. [place] with [place] trespassing.

## 2015-09-02 NOTE — ED Notes (Signed)
Pt reports going outside to smoke, informed pt that he can not leave and that if he leaves he will be discharged from system. Pt insists on going out to smoke. Dr little is aware, pt escorted out of department.

## 2015-09-20 ENCOUNTER — Emergency Department (HOSPITAL_COMMUNITY)
Admission: EM | Admit: 2015-09-20 | Discharge: 2015-09-21 | Disposition: A | Discharge status: Home / Self Care | Payer: Medicare Other | Attending: Emergency Medicine | Admitting: Emergency Medicine

## 2015-09-20 DIAGNOSIS — Z8572 Personal history of non-Hodgkin lymphomas: Secondary | ICD-10-CM | POA: Insufficient documentation

## 2015-09-20 DIAGNOSIS — Z8701 Personal history of pneumonia (recurrent): Secondary | ICD-10-CM | POA: Diagnosis not present

## 2015-09-20 DIAGNOSIS — Z8719 Personal history of other diseases of the digestive system: Secondary | ICD-10-CM | POA: Insufficient documentation

## 2015-09-20 DIAGNOSIS — I10 Essential (primary) hypertension: Secondary | ICD-10-CM | POA: Diagnosis not present

## 2015-09-20 DIAGNOSIS — Z8739 Personal history of other diseases of the musculoskeletal system and connective tissue: Secondary | ICD-10-CM | POA: Insufficient documentation

## 2015-09-20 DIAGNOSIS — Z8781 Personal history of (healed) traumatic fracture: Secondary | ICD-10-CM | POA: Diagnosis not present

## 2015-09-20 DIAGNOSIS — Z046 Encounter for general psychiatric examination, requested by authority: Secondary | ICD-10-CM | POA: Diagnosis present

## 2015-09-20 DIAGNOSIS — Z87438 Personal history of other diseases of male genital organs: Secondary | ICD-10-CM | POA: Diagnosis not present

## 2015-09-20 DIAGNOSIS — Z8639 Personal history of other endocrine, nutritional and metabolic disease: Secondary | ICD-10-CM | POA: Insufficient documentation

## 2015-09-20 DIAGNOSIS — Z86018 Personal history of other benign neoplasm: Secondary | ICD-10-CM | POA: Insufficient documentation

## 2015-09-20 DIAGNOSIS — Z9181 History of falling: Secondary | ICD-10-CM | POA: Insufficient documentation

## 2015-09-20 DIAGNOSIS — F4329 Adjustment disorder with other symptoms: Secondary | ICD-10-CM | POA: Diagnosis not present

## 2015-09-20 DIAGNOSIS — I482 Chronic atrial fibrillation: Secondary | ICD-10-CM | POA: Insufficient documentation

## 2015-09-20 DIAGNOSIS — I252 Old myocardial infarction: Secondary | ICD-10-CM | POA: Insufficient documentation

## 2015-09-20 DIAGNOSIS — Z79899 Other long term (current) drug therapy: Secondary | ICD-10-CM | POA: Insufficient documentation

## 2015-09-20 DIAGNOSIS — F17210 Nicotine dependence, cigarettes, uncomplicated: Secondary | ICD-10-CM | POA: Insufficient documentation

## 2015-09-20 NOTE — ED Notes (Signed)
Bed: [facility] Expected date:  Expected time:  Means of arrival:  Comments: [employer] bringing patient in/homeless

## 2015-09-20 NOTE — ED Provider Notes (Signed)
CSN: [id number]     Arrival date & time 09/20/15  2331 History   By signing my name below, I, [name], attest that this documentation has been prepared under the direction and in the presence of Orpah Greek, MD.  Electronically Signed: [name], ED Scribe. 09/20/2015. 11:59 PM.   No chief complaint on file.  The history is provided by the patient. No language interpreter was used.    HPI Comments: [name] brought in by [employer] voluntarily is a 70 y.o. male with a PMHx of depression and HLD who presents to the Emergency Department here for medical clearance this evening. Per triage note, police believe pt denied psychiatric services from jail as he was arrested yesterday. Pt denies any SI or HI at this time. He denies any auditory or visual hallucinations.  PCP: [name], MD    Past Medical History  Diagnosis Date  . Depression   . Fall at nursing home     December 2014  . History of migraine   . Gout attack   . Complete heart block ([place])     St. Jude pacemaker - followed by Dr. Lovena Le  . Chronic atrial fibrillation ([place])   . HLD (hyperlipidemia)   . GIB (gastrointestinal bleeding)   . BPH (benign prostatic hyperplasia)   . Essential hypertension   . Gastric ulcer   . Non Hodgkin's lymphoma ([place])   . Tubular adenoma of colon   . Aspiration pneumonia ([place]) 10/07/2014  . 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 Atypical angina ([place]) 12/01/2014  . BLOCK, AV, COMPLETE 12/24/2006    Annotation: Pacer placed Qualifier: Diagnosis of  By: Hilma Favors  DO, Beth    . 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 Fracture femur, cervicotrochanteric ([place]) 05/27/2013   Past Surgical History  Procedure Laterality Date  . Pacemaker generator change      Dr. Lovena Le  . Appendectomy    . Cholecystectomy    . Open reduction of hip Right  05/28/2013    Procedure: OPEN REDUCTION OF HIP;  Surgeon: Renette Butters, MD;  Location: [facility];  Service: Orthopedics;  Laterality: Right;  . Esophagogastroduodenoscopy (egd) with propofol N/A 09/26/2014    Procedure: ESOPHAGOGASTRODUODENOSCOPY (EGD) WITH PROPOFOL;  Surgeon: Jerene Bears, MD;  Location: [facility] ENDOSCOPY;  Service: Endoscopy;  Laterality: N/A;  . Colonoscopy N/A 10/01/2014    Procedure: COLONOSCOPY;  Surgeon: Jerene Bears, MD;  Location[facility] ENDOSCOPY;  Service: Endoscopy;  Laterality: N/A;  . Partial colectomy N/A 10/05/2014    Procedure: SUBTOTAL COLECTOMY WITH ILEORECTAL ANASTOMOSIS;  Surgeon: Donnie Mesa, MD;  Location: [facility];  Service: General;  Laterality: N/A;  . Flexible sigmoidoscopy N/A 11/07/2014    Procedure: FLEXIBLE SIGMOIDOSCOPY;  Surgeon: Jerene Bears, MD;  Location: [facility] ENDOSCOPY;  Service: Endoscopy;  Laterality: N/A;  . Gastric resection    . Flexible sigmoidoscopy N/A 07/30/2015    Procedure: FLEXIBLE SIGMOIDOSCOPY;  Surgeon: Doran Stabler, MD;  Location: [facility] ENDOSCOPY;  Service: Endoscopy;  Laterality: N/A;  . Transanal excision of rectal mass N/A 08/03/2015    Procedure: TRANSANAL MINIMALLY INVASIVE SURGERY TO RESECT RECTAL POLYP;  Surgeon: [name], MD;  Location: [facility] ORS;  Service: General;  Laterality: N/A;  . Flexible sigmoidoscopy N/A 08/03/2015    Procedure: FLEXIBLE SIGMOIDOSCOPY;  Surgeon: [name], MD;  Location: [facility] ORS;  Service: General;  Laterality: N/A;   Family History  Problem Relation Age of Onset  . Colon cancer Mother    Social History  Substance Use Topics  . Smoking status: Current Every Day Smoker -- 0.50 packs/day for 66 years    Types: Cigarettes  . Smokeless tobacco: Former Systems developer    Types: Snuff, Chew     Comment: 05/27/2013 "haven't used chew or snuff in ~ 30 yr"  . Alcohol Use: 0.0 oz/week    0 Standard drinks or equivalent per week     Comment: "I drink a fifth of bourbon a month"    Review of Systems   Constitutional: Negative for fever and chills.  Cardiovascular: Negative for chest pain.  Gastrointestinal: Negative for nausea and vomiting.  Psychiatric/Behavioral: Negative for suicidal ideas.  All other systems reviewed and are negative.     Allergies  Review of patient's allergies indicates no known allergies.  Home Medications   Prior to Admission medications   Medication Sig Start Date End Date Taking? Authorizing Provider  carvedilol (COREG) 6.25 MG tablet Take 1 tablet (6.25 mg total) by mouth 2 (two) times daily with a meal. 11/16/14  Yes Velvet Bathe, MD  feeding supplement, ENSURE ENLIVE, (ENSURE ENLIVE) LIQD Take 237 mLs by mouth 2 (two) times daily between meals. 08/07/15  Yes Venetia Maxon Rama, MD  oxyCODONE (OXY IR/ROXICODONE) 5 MG immediate release tablet Take 1 tablet (5 mg total) by mouth every 6 (six) hours as needed for severe pain. 08/07/15  Yes Venetia Maxon Rama, MD  albuterol (PROVENTIL HFA;VENTOLIN HFA) 108 (90 Base) MCG/ACT inhaler Inhale 2 puffs into the lungs every 4 (four) hours as needed for wheezing or shortness of breath. Patient not taking: Reported on 09/21/2015 08/07/15   Venetia Maxon Rama, MD   Triage Vitals: BP 133/93 mmHg  Pulse 98  Temp(Src) 97.7 F (36.5 C) (Oral)  Resp 18  SpO2 95%   Physical Exam  Constitutional: He is oriented to person, place, and time. He appears well-developed and well-nourished. No distress.  HENT:  Head: Normocephalic and atraumatic.  Right Ear: Hearing normal.  Left Ear: Hearing normal.  Nose: Nose normal.  Mouth/Throat: Oropharynx is clear and moist and mucous membranes are normal.  Eyes: Conjunctivae and EOM are normal. Pupils are equal, round, and reactive to light.  Neck: Normal range of motion. Neck supple.  Cardiovascular: Normal rate, regular rhythm, S1 normal, S2 normal and normal heart sounds.  Exam reveals no gallop and no friction rub.   No murmur heard. Pulmonary/Chest: Effort normal and breath sounds  normal. No respiratory distress. He exhibits no tenderness.  Abdominal: Soft. Normal appearance and bowel sounds are normal. There is no hepatosplenomegaly. There is no tenderness. There is no rebound, no guarding, no tenderness at McBurney's point and negative Murphy's sign. No hernia.  Musculoskeletal: Normal range of motion.  Neurological: He is alert and oriented to person, place, and time. He has normal strength. No cranial nerve deficit or sensory deficit. Coordination normal. GCS eye subscore is 4. GCS verbal subscore is 5. GCS motor subscore is 6.  Skin: Skin is warm, dry and intact. No rash noted. No cyanosis.  Psychiatric: He has a normal mood and affect. His speech is normal and behavior is normal. Thought content normal.  Nursing note and vitals reviewed.   ED Course  Procedures (including critical care time)  DIAGNOSTIC STUDIES: Oxygen Saturation is 95% on RA, adequate by my  interpretation.    COORDINATION OF CARE: 11:59 PM- Will order blood work and urine rapid drug screen. Discussed treatment plan with pt at bedside and pt agreed to plan.     Labs Review Labs Reviewed  COMPREHENSIVE METABOLIC PANEL - Abnormal; Notable for the following:    BUN 25 (*)    Total Protein 8.4 (*)    All other components within normal limits  ACETAMINOPHEN LEVEL - Abnormal; Notable for the following:    Acetaminophen (Tylenol), Serum <10 (*)    All other components within normal limits  CBC - Abnormal; Notable for the following:    RBC 3.64 (*)    Hemoglobin 10.4 (*)    HCT 32.7 (*)    RDW 16.7 (*)    All other components within normal limits  ETHANOL  SALICYLATE LEVEL  URINE RAPID DRUG SCREEN, HOSP PERFORMED    Imaging Review No results found. I have personally reviewed and evaluated these images and lab results as part of my medical decision-making.   EKG Interpretation None      MDM   Final diagnoses:  None  Adjustment Disorder  Brought to the emergency department by  [facility]. Patient was reportedly arrested yesterday for flagrant CT and trespassing, when she expressed suicidal ideations. Upon discharge, he reviewed psychiatric services and was brought to the ER for further evaluation. At arrival to the ER, patient is not homicidal or suicidal. He has been evaluated by psychiatry and he is cleared for discharge.  I personally performed the services described in this documentation, which was scribed in my presence. The recorded information has been reviewed and is accurate.   Orpah Greek, MD 09/21/15 [phone]

## 2015-09-20 NOTE — ED Notes (Signed)
Pt bib [employer] voluntarily wanting to be treated for suicidal ideations. [employer] believes that he refused psychiatric services from jail as he was arrested yesterday. Alert and oriented.

## 2015-09-21 DIAGNOSIS — F4329 Adjustment disorder with other symptoms: Secondary | ICD-10-CM | POA: Diagnosis not present

## 2015-09-21 LAB — RAPID URINE DRUG SCREEN, HOSP PERFORMED
AMPHETAMINES: NOT DETECTED
Barbiturates: NOT DETECTED
Benzodiazepines: NOT DETECTED
Cocaine: NOT DETECTED
OPIATES: NOT DETECTED
Tetrahydrocannabinol: NOT DETECTED

## 2015-09-21 LAB — CBC
HCT: 32.7 % — ABNORMAL LOW (ref 39.0–52.0)
Hemoglobin: 10.4 g/dL — ABNORMAL LOW (ref 13.0–17.0)
MCH: 28.6 pg (ref 26.0–34.0)
MCHC: 31.8 g/dL (ref 30.0–36.0)
MCV: 89.8 fL (ref 78.0–100.0)
PLATELETS: 197 10*3/uL (ref 150–400)
RBC: 3.64 MIL/uL — ABNORMAL LOW (ref 4.22–5.81)
RDW: 16.7 % — AB (ref 11.5–15.5)
WBC: 8.8 10*3/uL (ref 4.0–10.5)

## 2015-09-21 LAB — COMPREHENSIVE METABOLIC PANEL
ALT: 29 U/L (ref 17–63)
AST: 21 U/L (ref 15–41)
Albumin: 3.6 g/dL (ref 3.5–5.0)
Alkaline Phosphatase: 95 U/L (ref 38–126)
Anion gap: 10 (ref 5–15)
BILIRUBIN TOTAL: 0.4 mg/dL (ref 0.3–1.2)
BUN: 25 mg/dL — AB (ref 6–20)
CALCIUM: 9.1 mg/dL (ref 8.9–10.3)
CO2: 27 mmol/L (ref 22–32)
CREATININE: 0.82 mg/dL (ref 0.61–1.24)
Chloride: 101 mmol/L (ref 101–111)
Glucose, Bld: 99 mg/dL (ref 65–99)
Potassium: 4.4 mmol/L (ref 3.5–5.1)
Sodium: 138 mmol/L (ref 135–145)
TOTAL PROTEIN: 8.4 g/dL — AB (ref 6.5–8.1)

## 2015-09-21 LAB — ACETAMINOPHEN LEVEL: Acetaminophen (Tylenol), Serum: <10 ug/mL — ABNORMAL LOW (ref 10–30)

## 2015-09-21 LAB — SALICYLATE LEVEL

## 2015-09-21 LAB — ETHANOL

## 2015-09-21 NOTE — Progress Notes (Signed)
Pt with 29 ED visits an 2 admissions in last 6 months  Pt with ED CP  Pcp listed as Mallie Mussel tripp

## 2015-09-21 NOTE — ED Notes (Signed)
Social work states they haven't heard back from [facility] yet.

## 2015-09-21 NOTE — ED Notes (Signed)
Pt soiled the bed. Pt walked to the bathroom and cleaned himself up. Linens changed

## 2015-09-21 NOTE — Discharge Instructions (Signed)
Substance Abuse Treatment Programs ° °Intensive Outpatient Programs °[facility]     °[address]      °[place], [place]                   °[phone]      ° °[facility] °[address] °[place], [place] °[phone] ° °[facility]     °(Inpatient and outpatient)     °[address]           °[phone]   ° °[facility] °[phone] (Suboxone and Methadone) ° °[address]      °[place], [place] [address]      °[phone]      ° °[address] °[place], [place] °852-3033 ° °[facility] (Outpatient/Inpatient, Chemical)    °(insurance only) [phone]      °       °[facility] (Groups & Residential) °[place], [place] °[phone] ° °   °[facility]     °[address]     °[place], [place]      °[phone]      ° °[facility] (for caregivers and family) °[address] °[place], [place] °336-299-4655 ° ° ° ° ° °Residential Treatment Programs °[facility]      °[address], [place], [place] [address]  °[phone]      ° °[facility] °[address] [place], [place] [address] °[phone] ° °[facility]        °[phone]      ° °[facility] °[phone] ° °[facility]             °[address]                                         °[place], [place]                                                °[phone] or [phone]                              ° °[facility] [place] °[address] °[place], [address] °[phone] ° °[facility]    °[address]      °[place], [place]     °[phone]      ° °[facility] °[address] °[place], [place] °[phone] ° °[facility]   °[address]     °[place], [place] [address]     °[phone]      °Admissions: 8am-3pm M-F ° °[facility] °[address] °[place],  [place] °336-227-7417 ° °[facility]: Residential Program (90 Days)   °[place], [place]      °336-725-8389 or [phone]    ° °[facility] °[place], [place] °(Walk in Hours over the weekend or by referral) ° °[facility] °[address], [place], [place] [address] °([phone] ° °Crisis Mobile: [facility]:  1-877-626-1772 (for crisis response 24 hours a day) °[facility] Hotline:      1-800-256-2452 °Outpatient Psychiatry and Counseling ° °[facility]: Mobile Crisis   Management 24 hours:  1-877-626-1772 ° °[facility] sliding scale fee and walk in schedule: M-F 8am-12pm/1pm-3pm °[address]  °[place], [place] [address] °[phone] ° °[facility] °[address] °[place], [place] [address] °336-703-9650 ° °[facility] ([facility])- new patient walk-in appointments available Monday - Friday 8am -3pm.          °[address] °[place], [place] [address] °336-676-6840 or crisis line- 336-676-6905 ° °[facility] Intensive Outpatient Therapy Program °[address] °[place], [place] [address] °336-832-9804 ° °[facility]                  °Crisis Services      °[phone]      °[address]     °[place], [place] [address]                ° °[facility]   °[facility] °[phone] °[address] °[place], [place] [address] ° ° °[facility]          °[address]  °[place], [place] [address]       °([phone] ° °[facility] °[address] °[place], [place] [address] °336-292-1510 ° °[facility]    °[address]    °[place], [place] [address]     °[phone]      ° °Parish McKinney, MD     °[address]     °[place] [address]     °[phone]     °  °[facility] °[address] °[place] [address] ° °[facility]     °[address]     °[place], [place]      °336-542-2076      ° °[facility] °Shamsher Ahluwalia, MD °[address] °[place], [place] [address] °[phone] ° °[facility]     °[address]     °[place], [place] [address]     °336-274-1237      ° °[facility] °[address] °[place], [place] [address] °336-854-4450 °Resources for Temporary Residential Assistance/Crisis Centers ° °DAY CENTERS °[facility] °M-F 8am-3pm   °[address] [place], [place] [address]   336-332-0824 °Services include: laundry, barbering, support groups, case management, phone  & computer access, showers, AA/NA mtgs, mental health/substance abuse nurse, job skills class, disability information, VA assistance, spiritual classes, etc.  ° °HOMELESS SHELTERS ° °[facility]     °[facility]   °[address], [place]     °[phone]       °       °[facility] (women and children)       °[address] °[place], [place] [address] °336-275-0820 °[email or web address] for application and process °Application Required ° °[facility]   °[address]    °[place] [address]     °[phone]       °             °[facility] °[address] °[place], [place] [address] °[phone] °336-235-0363(schedule application appt.) °Application Required ° °[facility] (women only)    °[address]     °[place],  [address]     °336-884-1039      °  Intake starts 6pm daily °Need valid ID, SSC, & Police report °[facility] [place] °[address] °[place], [place] °336-881-5420 °Application Required ° °[facility] (men only)     °[address]      °[place], [place]     °[phone]      ° °[facility] °(Pregnant women only) °[address] °[place], [place] °336-275-0206 ° °[facility]      °[address]      °[place], [place] [address]     °[phone]      °       °[facility] °[address] °[place], [place] °336-723-1848 °90 day commitment/SA/Application process ° °[facility](men only)     °[address]     °[place], [place]     °[phone]       °Check-in at 7pm     °       °[facility] °[address] °[place], [place] [address] °336-248-6684 °Men/Women/Women and Children must be there by 7 pm ° °[facility] °[place],  °336-722-8721                ° °

## 2015-09-21 NOTE — Progress Notes (Signed)
CSW staffed with Nurse and she states patient has been cleared for discharge. CSW spoke with patient at bedside with no family present. Patient reports that he normally stays outside. Patient reports that he has resided at one shelter for one month and another shelter for a "three or four days".   CSW called [employer] and spoke with Legrand Como, Market researcher to see if they had availability for a male. He stated they did not have any beds at the time.  CSW called [employer] and left name and contact number for a return call. CSW informed Nurse.  Genice Rouge [id number] ED CSW 09/21/2015 9:10 AM

## 2015-09-21 NOTE — ED Notes (Signed)
Pt is alert & oriented x 4.  [name], CSW has arranged for pt to spend the night in the shelter lobby & provided a taxi voucher.  [name] has called for the taxi.

## 2015-09-21 NOTE — Progress Notes (Signed)
CSW called [employer] again and was informed that they are not taking applications at this time.  CSW called [employer] and spoke with [place]. She stated they do not have any bed availability and to call back on Monday.  Genice Rouge [id number] ED CSW 09/21/2015 3:14 PM

## 2015-09-21 NOTE — ED Notes (Signed)
Pt shaking stool out of his pants and onto floor.  Encouraged pt to put his pants back in the bag.  Pt complied.

## 2015-09-21 NOTE — ED Notes (Signed)
Pt ambulated to the restroom with no assistance.

## 2015-09-21 NOTE — ED Notes (Signed)
Gave pt [place] sandwhich and coca-cola
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
## 2015-09-21 NOTE — ED Notes (Signed)
PT walked back to room 26 getting his TTS consult in private.

## 2015-09-22 ENCOUNTER — Emergency Department (HOSPITAL_COMMUNITY)
Admission: EM | Admit: 2015-09-22 | Discharge: 2015-09-22 | Disposition: A | Discharge status: Home / Self Care | Payer: Medicare Other | Source: Home / Self Care | Attending: Emergency Medicine | Admitting: Emergency Medicine

## 2015-09-22 ENCOUNTER — Encounter (HOSPITAL_COMMUNITY): Payer: Self-pay | Admitting: Emergency Medicine

## 2015-09-22 ENCOUNTER — Emergency Department (HOSPITAL_COMMUNITY): Payer: Medicare Other

## 2015-09-22 ENCOUNTER — Other Ambulatory Visit: Payer: Self-pay

## 2015-09-22 ENCOUNTER — Encounter (HOSPITAL_COMMUNITY): Payer: Self-pay

## 2015-09-22 ENCOUNTER — Emergency Department (HOSPITAL_COMMUNITY)
Admission: EM | Admit: 2015-09-22 | Discharge: 2015-09-22 | Disposition: A | Discharge status: Home / Self Care | Payer: Medicare Other | Attending: Emergency Medicine | Admitting: Emergency Medicine

## 2015-09-22 DIAGNOSIS — Z79899 Other long term (current) drug therapy: Secondary | ICD-10-CM | POA: Insufficient documentation

## 2015-09-22 DIAGNOSIS — Y998 Other external cause status: Secondary | ICD-10-CM | POA: Diagnosis not present

## 2015-09-22 DIAGNOSIS — Z86010 Personal history of colonic polyps: Secondary | ICD-10-CM | POA: Insufficient documentation

## 2015-09-22 DIAGNOSIS — I208 Other forms of angina pectoris: Secondary | ICD-10-CM | POA: Diagnosis not present

## 2015-09-22 DIAGNOSIS — I499 Cardiac arrhythmia, unspecified: Secondary | ICD-10-CM | POA: Insufficient documentation

## 2015-09-22 DIAGNOSIS — E785 Hyperlipidemia, unspecified: Secondary | ICD-10-CM | POA: Insufficient documentation

## 2015-09-22 DIAGNOSIS — Z8572 Personal history of non-Hodgkin lymphomas: Secondary | ICD-10-CM | POA: Insufficient documentation

## 2015-09-22 DIAGNOSIS — R0789 Other chest pain: Secondary | ICD-10-CM | POA: Insufficient documentation

## 2015-09-22 DIAGNOSIS — Z87438 Personal history of other diseases of male genital organs: Secondary | ICD-10-CM | POA: Insufficient documentation

## 2015-09-22 DIAGNOSIS — Y92480 Sidewalk as the place of occurrence of the external cause: Secondary | ICD-10-CM | POA: Diagnosis not present

## 2015-09-22 DIAGNOSIS — S0990XA Unspecified injury of head, initial encounter: Secondary | ICD-10-CM | POA: Diagnosis not present

## 2015-09-22 DIAGNOSIS — F17210 Nicotine dependence, cigarettes, uncomplicated: Secondary | ICD-10-CM | POA: Insufficient documentation

## 2015-09-22 DIAGNOSIS — W1839XA Other fall on same level, initial encounter: Secondary | ICD-10-CM | POA: Diagnosis not present

## 2015-09-22 DIAGNOSIS — R079 Chest pain, unspecified: Secondary | ICD-10-CM | POA: Insufficient documentation

## 2015-09-22 DIAGNOSIS — S0091XA Abrasion of unspecified part of head, initial encounter: Secondary | ICD-10-CM | POA: Diagnosis not present

## 2015-09-22 DIAGNOSIS — G8929 Other chronic pain: Secondary | ICD-10-CM

## 2015-09-22 DIAGNOSIS — Z8701 Personal history of pneumonia (recurrent): Secondary | ICD-10-CM | POA: Insufficient documentation

## 2015-09-22 DIAGNOSIS — Z8719 Personal history of other diseases of the digestive system: Secondary | ICD-10-CM | POA: Insufficient documentation

## 2015-09-22 DIAGNOSIS — T07XXXA Unspecified multiple injuries, initial encounter: Secondary | ICD-10-CM

## 2015-09-22 DIAGNOSIS — F329 Major depressive disorder, single episode, unspecified: Secondary | ICD-10-CM | POA: Insufficient documentation

## 2015-09-22 DIAGNOSIS — Z8781 Personal history of (healed) traumatic fracture: Secondary | ICD-10-CM | POA: Insufficient documentation

## 2015-09-22 DIAGNOSIS — I10 Essential (primary) hypertension: Secondary | ICD-10-CM | POA: Diagnosis not present

## 2015-09-22 DIAGNOSIS — Z8639 Personal history of other endocrine, nutritional and metabolic disease: Secondary | ICD-10-CM

## 2015-09-22 DIAGNOSIS — Z86018 Personal history of other benign neoplasm: Secondary | ICD-10-CM

## 2015-09-22 DIAGNOSIS — Z8659 Personal history of other mental and behavioral disorders: Secondary | ICD-10-CM | POA: Insufficient documentation

## 2015-09-22 DIAGNOSIS — Y9389 Activity, other specified: Secondary | ICD-10-CM | POA: Insufficient documentation

## 2015-09-22 DIAGNOSIS — W19XXXA Unspecified fall, initial encounter: Secondary | ICD-10-CM

## 2015-09-22 DIAGNOSIS — M791 Myalgia: Secondary | ICD-10-CM

## 2015-09-22 LAB — CBC
HCT: 32.9 % — ABNORMAL LOW (ref 39.0–52.0)
HEMOGLOBIN: 10.2 g/dL — AB (ref 13.0–17.0)
MCH: 28.1 pg (ref 26.0–34.0)
MCHC: 31 g/dL (ref 30.0–36.0)
MCV: 90.6 fL (ref 78.0–100.0)
PLATELETS: 204 10*3/uL (ref 150–400)
RBC: 3.63 MIL/uL — AB (ref 4.22–5.81)
RDW: 17 % — ABNORMAL HIGH (ref 11.5–15.5)
WBC: 8 10*3/uL (ref 4.0–10.5)

## 2015-09-22 LAB — I-STAT CHEM 8, ED
BUN: 24 mg/dL — ABNORMAL HIGH (ref 6–20)
Calcium, Ion: 1.11 mmol/L — ABNORMAL LOW (ref 1.13–1.30)
Chloride: 100 mmol/L — ABNORMAL LOW (ref 101–111)
Creatinine, Ser: 0.8 mg/dL (ref 0.61–1.24)
Glucose, Bld: 112 mg/dL — ABNORMAL HIGH (ref 65–99)
HEMATOCRIT: 36 % — AB (ref 39.0–52.0)
HEMOGLOBIN: 12.2 g/dL — AB (ref 13.0–17.0)
POTASSIUM: 4.7 mmol/L (ref 3.5–5.1)
SODIUM: 136 mmol/L (ref 135–145)
TCO2: 25 mmol/L (ref 0–100)

## 2015-09-22 LAB — BASIC METABOLIC PANEL
ANION GAP: 10 (ref 5–15)
BUN: 21 mg/dL — ABNORMAL HIGH (ref 6–20)
CALCIUM: 8.9 mg/dL (ref 8.9–10.3)
CO2: 24 mmol/L (ref 22–32)
CREATININE: 0.82 mg/dL (ref 0.61–1.24)
Chloride: 101 mmol/L (ref 101–111)
Glucose, Bld: 120 mg/dL — ABNORMAL HIGH (ref 65–99)
Potassium: 4.9 mmol/L (ref 3.5–5.1)
SODIUM: 135 mmol/L (ref 135–145)

## 2015-09-22 LAB — I-STAT TROPONIN, ED: TROPONIN I, POC: 0.02 ng/mL (ref 0.00–0.08)

## 2015-09-22 MED ORDER — GI COCKTAIL ~~LOC~~
30.0000 mL | Freq: Once | ORAL | Status: AC
  Administered 2015-09-22: 30 mL via ORAL
  Filled 2015-09-22: qty 30

## 2015-09-22 MED ORDER — TETANUS-DIPHTH-ACELL PERTUSSIS 5-2.5-18.5 LF-MCG/0.5 IM SUSP
0.5000 mL | Freq: Once | INTRAMUSCULAR | Status: AC
  Administered 2015-09-22: 0.5 mL via INTRAMUSCULAR
  Filled 2015-09-22: qty 0.5

## 2015-09-22 NOTE — ED Provider Notes (Signed)
CSN: [id number]     Arrival date & time 09/22/15  0127 History   By signing my name below, I, [name], attest that this documentation has been prepared under the direction and in the presence of [name], MD . Electronically Signed: Rowan Goodwin, Scribe. 09/22/2015. 3:50 AM.   Chief Complaint  Patient presents with  . Chest Pain   Patient is a 70 y.o. male presenting with chest pain. The history is provided by the patient. No language interpreter was used.  Chest Pain Pain location:  Substernal area Pain radiates to:  Does not radiate Pain radiates to the back: no   Pain severity:  Moderate Onset quality:  Gradual Duration:  12 hours Timing:  Constant Progression:  Unchanged Chronicity:  Recurrent Context: no drug use   Relieved by:  Nothing Worsened by:  Nothing tried Ineffective treatments:  None tried Associated symptoms: no abdominal pain, no anxiety, no palpitations and no shortness of breath   Risk factors: male sex    HPI Comments:  [name] is a 70 y.o. male with PMHx of complete AV block, chronic A-fib, HTN, HLD, GI bleed, and aortic atherosclerosis who presents to the Emergency Department via EMS complaining of chest pain onset ~12 hours ago while "laying down on the side of the road". Pt was seen at [facility] earlier today for similar symptoms. Pt reports he fell on the side walk after chest pain onset, hitting his head. Pt reports associated abdominal pain. He is unsure of his last tetanus. Pt denies injury prior to chest pain onset.  Past Medical History  Diagnosis Date  . Depression   . Fall at nursing home     December 2014  . History of migraine   . Gout attack   . Complete heart block ([place])     St. Jude pacemaker - followed by Dr. Lovena Le  . Chronic atrial fibrillation ([place])   . HLD (hyperlipidemia)   . GIB (gastrointestinal bleeding)   . BPH (benign prostatic hyperplasia)   . Essential hypertension   . Gastric ulcer   . Non  Hodgkin's lymphoma ([place])   . Tubular adenoma of colon   . Aspiration pneumonia ([place]) 10/07/2014  . 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 Atypical angina ([place]) 12/01/2014  . BLOCK, AV, COMPLETE 12/24/2006    Annotation: Pacer placed Qualifier: Diagnosis of  By: Hilma Favors  DO, Beth    . 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 Fracture femur, cervicotrochanteric ([place]) 05/27/2013   Past Surgical History  Procedure Laterality Date  . Pacemaker generator change      Dr. Lovena Le  . Appendectomy    . Cholecystectomy    . Open reduction of hip Right 05/28/2013    Procedure: OPEN REDUCTION OF HIP;  Surgeon: Renette Butters, MD;  Location: [place];  Service: Orthopedics;  Laterality: Right;  . Esophagogastroduodenoscopy (egd) with propofol N/A 09/26/2014    Procedure: ESOPHAGOGASTRODUODENOSCOPY (EGD) WITH PROPOFOL;  Surgeon: Jerene Bears, MD;  Location: [facility] ENDOSCOPY;  Service: Endoscopy;  Laterality: N/A;  . Colonoscopy N/A 10/01/2014    Procedure: COLONOSCOPY;  Surgeon: Jerene Bears, MD;  Location: [facility] ENDOSCOPY;  Service: Endoscopy;  Laterality: N/A;  . Partial colectomy N/A 10/05/2014    Procedure: SUBTOTAL COLECTOMY WITH ILEORECTAL ANASTOMOSIS;  Surgeon: Donnie Mesa, MD;  Location: [place] OR;  Service: General;  Laterality: N/A;  . Flexible sigmoidoscopy N/A 11/07/2014    Procedure: FLEXIBLE SIGMOIDOSCOPY;  Surgeon: Jerene Bears, MD;  Location: [place] ENDOSCOPY;  Service: Endoscopy;  Laterality: N/A;  . Gastric resection    . Flexible sigmoidoscopy N/A 07/30/2015    Procedure: FLEXIBLE SIGMOIDOSCOPY;  Surgeon: Doran Stabler, MD;  Location[facility] ENDOSCOPY;  Service: Endoscopy;  Laterality: N/A;  . Transanal excision of rectal mass N/A 08/03/2015    Procedure: TRANSANAL MINIMALLY INVASIVE SURGERY TO RESECT RECTAL POLYP;  Surgeon: [name], MD;  Location[facility] ORS;   Service: General;  Laterality: N/A;  . Flexible sigmoidoscopy N/A 08/03/2015    Procedure: FLEXIBLE SIGMOIDOSCOPY;  Surgeon: [name], MD;  Location[facility] ORS;  Service: General;  Laterality: N/A;   Family History  Problem Relation Age of Onset  . Colon cancer Mother    Social History  Substance Use Topics  . Smoking status: Current Every Day Smoker -- 0.50 packs/day for 66 years    Types: Cigarettes  . Smokeless tobacco: Former Systems developer    Types: Snuff, Chew     Comment: 05/27/2013 "haven't used chew or snuff in ~ 30 yr"  . Alcohol Use: 0.0 oz/week    0 Standard drinks or equivalent per week     Comment: "I drink a fifth of bourbon a month"    Review of Systems  Respiratory: Negative for shortness of breath.   Cardiovascular: Positive for chest pain. Negative for palpitations and leg swelling.  Gastrointestinal: Negative for abdominal pain.  All other systems reviewed and are negative.  Allergies  Review of patient's allergies indicates no known allergies.  Home Medications   Prior to Admission medications   Medication Sig Start Date End Date Taking? Authorizing Provider  albuterol (PROVENTIL HFA;VENTOLIN HFA) 108 (90 Base) MCG/ACT inhaler Inhale 2 puffs into the lungs every 4 (four) hours as needed for wheezing or shortness of breath. Patient not taking: Reported on 09/21/2015 08/07/15   Venetia Maxon Rama, MD  carvedilol (COREG) 6.25 MG tablet Take 1 tablet (6.25 mg total) by mouth 2 (two) times daily with a meal. 11/16/14   Velvet Bathe, MD  feeding supplement, ENSURE ENLIVE, (ENSURE ENLIVE) LIQD Take 237 mLs by mouth 2 (two) times daily between meals. 08/07/15   Venetia Maxon Rama, MD  oxyCODONE (OXY IR/ROXICODONE) 5 MG immediate release tablet Take 1 tablet (5 mg total) by mouth every 6 (six) hours as needed for severe pain. 08/07/15   Christina P Rama, MD   BP 113/70 mmHg  Pulse 106  Temp(Src) 98.4 F (36.9 C) (Oral)  Resp 16  SpO2 95% Physical Exam  Constitutional: He is  oriented to person, place, and time. No distress.  HENT:  Head: Normocephalic. Head is without raccoon's eyes and without Battle's sign.  Right Ear: No hemotympanum.  Left Ear: No hemotympanum.  Mouth/Throat: Oropharynx is clear and moist.  Abrasions on head  Eyes: Pupils are equal, round, and reactive to light.  Neck: Normal range of motion. Neck supple.  Cardiovascular: Normal rate and regular rhythm.   Pulmonary/Chest: Effort normal and breath sounds normal. No respiratory distress. He has no wheezes. He has no rales.  Abdominal: Soft. There is no tenderness. There is no rebound and no guarding.  Hyperactive bowel sounds  Musculoskeletal: Normal range of motion. He exhibits no tenderness.       Cervical back: Normal.       Thoracic back: Normal.  Lumbar back: Normal.  Neurological: He is alert and oriented to person, place, and time. He has normal reflexes.  Skin: Skin is warm and dry.  Psychiatric: He has a normal mood and affect.   ED Course  Procedures  DIAGNOSTIC STUDIES:  Oxygen Saturation is 95% on RA, adequate by my interpretation.    COORDINATION OF CARE:  2:54 AM Will administer GI cocktail, order head CT and update Tetanus. Discussed treatment plan with pt at bedside and pt agreed to plan.  Labs Review Labs Reviewed  BASIC METABOLIC PANEL - Abnormal; Notable for the following:    Glucose, Bld 120 (*)    BUN 21 (*)    All other components within normal limits  CBC - Abnormal; Notable for the following:    RBC 3.63 (*)    Hemoglobin 10.2 (*)    HCT 32.9 (*)    RDW 17.0 (*)    All other components within normal limits  I-STAT CHEM 8, ED - Abnormal; Notable for the following:    Chloride 100 (*)    BUN 24 (*)    Glucose, Bld 112 (*)    Calcium, Ion 1.11 (*)    Hemoglobin 12.2 (*)    HCT 36.0 (*)    All other components within normal limits  CBC WITH DIFFERENTIAL/PLATELET  Randolm Idol, ED  Randolm Idol, ED    Imaging Review Dg Chest 2  View  09/22/2015  CLINICAL DATA:  Acute onset of generalized chest pain. Initial encounter. EXAM: CHEST  2 VIEW COMPARISON:  Chest radiograph performed 09/01/2015 FINDINGS: The lungs are hyperexpanded, with flattening of the hemidiaphragms, compatible with COPD. There is no evidence of focal opacification, pleural effusion or pneumothorax. The cardiomediastinal silhouette is borderline normal in size. A pacemaker is noted ending at the right chest wall, with leads ending overlying the right atrium and right ventricle. No acute osseous abnormalities are seen. Clips are noted within the right upper quadrant, reflecting prior cholecystectomy. IMPRESSION: Findings of COPD.  Lungs remain grossly clear. Electronically Signed   By: Garald Balding M.D.   On: 09/22/2015 03:21   I have personally reviewed and evaluated these images and lab results as part of my medical decision-making.   EKG Interpretation   Date/Time:  Friday Apryll Hinkle 14 2017 01:41:22 EDT Ventricular Rate:  97 PR Interval:    QRS Duration: 92 QT Interval:  338 QTC Calculation: 429 R Axis:   141 Text Interpretation:  Atrial fibrillation Anterior infarct , old Confirmed  by [facility]  MD, Anira Senegal ([id number]) on 09/22/2015 2:04:02 AM      MDM   Final diagnoses:  None   Results for orders placed or performed during the hospital encounter of [id number]  Basic metabolic panel  Result Value Ref Range   Sodium 135 135 - 145 mmol/L   Potassium 4.9 3.5 - 5.1 mmol/L   Chloride 101 101 - 111 mmol/L   CO2 24 22 - 32 mmol/L   Glucose, Bld 120 (H) 65 - 99 mg/dL   BUN 21 (H) 6 - 20 mg/dL   Creatinine, Ser 0.82 0.61 - 1.24 mg/dL   Calcium 8.9 8.9 - 10.3 mg/dL   GFR calc non Af Amer >60 >60 mL/min   GFR calc Af Amer >60 >60 mL/min   Anion gap 10 5 - 15  CBC  Result Value Ref Range   WBC 8.0 4.0 - 10.5 K/uL   RBC 3.63 (L) 4.22 - 5.81 MIL/uL   Hemoglobin 10.2 (L) 13.0 -  17.0 g/dL   HCT 32.9 (L) 39.0 - 52.0 %   MCV 90.6 78.0 - 100.0 fL   MCH  28.1 26.0 - 34.0 pg   MCHC 31.0 30.0 - 36.0 g/dL   RDW 17.0 (H) 11.5 - 15.5 %   Platelets 204 150 - 400 K/uL  I-stat troponin, ED  Result Value Ref Range   Troponin i, poc 0.02 0.00 - 0.08 ng/mL   Comment 3          I-Stat Chem 8, ED  Result Value Ref Range   Sodium 136 135 - 145 mmol/L   Potassium 4.7 3.5 - 5.1 mmol/L   Chloride 100 (L) 101 - 111 mmol/L   BUN 24 (H) 6 - 20 mg/dL   Creatinine, Ser 0.80 0.61 - 1.24 mg/dL   Glucose, Bld 112 (H) 65 - 99 mg/dL   Calcium, Ion 1.11 (L) 1.13 - 1.30 mmol/L   TCO2 25 0 - 100 mmol/L   Hemoglobin 12.2 (L) 13.0 - 17.0 g/dL   HCT 36.0 (L) 39.0 - 52.0 %   Dg Chest 2 View  09/22/2015  CLINICAL DATA:  Acute onset of generalized chest pain. Initial encounter. EXAM: CHEST  2 VIEW COMPARISON:  Chest radiograph performed 09/01/2015 FINDINGS: The lungs are hyperexpanded, with flattening of the hemidiaphragms, compatible with COPD. There is no evidence of focal opacification, pleural effusion or pneumothorax. The cardiomediastinal silhouette is borderline normal in size. A pacemaker is noted ending at the right chest wall, with leads ending overlying the right atrium and right ventricle. No acute osseous abnormalities are seen. Clips are noted within the right upper quadrant, reflecting prior cholecystectomy. IMPRESSION: Findings of COPD.  Lungs remain grossly clear. Electronically Signed   By: Garald Balding M.D.   On: 09/22/2015 03:21   Dg Chest 2 View  09/01/2015  CLINICAL DATA:  Shortness of Breath EXAM: CHEST  2 VIEW COMPARISON:  07/30/2015 FINDINGS: Cardiac shadow is stable. Pacing device is again seen and stable. The lungs are clear bilaterally. No acute bony abnormality is seen. IMPRESSION: No active cardiopulmonary disease. Electronically Signed   By: Inez Catalina M.D.   On: 09/01/2015 13:57    Filed Vitals:   09/22/15 0142  BP: 113/70  Pulse: 106  Temp: 98.4 F (36.9 C)  Resp: 16    Medications  Tdap (BOOSTRIX) injection 0.5 mL (0.5 mLs  Intramuscular Given 09/22/15 0335)  gi cocktail (Maalox,Lidocaine,Donnatal) (30 mLs Oral Given 09/22/15 0335)    Ruled out for MI and trauma.  I highly doubt ACS in this patient.  Doubt PE.  Given GI cocktail as patient complained of stomach issues.  Stable for discharge.  Follow up with your PMD for ongoing care strict return precautions given  I personally performed the services described in this documentation, which was scribed in my presence. The recorded information has been reviewed and is accurate.      Veatrice Kells, MD 09/22/15 [phone]

## 2015-09-22 NOTE — ED Provider Notes (Signed)
CSN: [id number]     Arrival date & time 09/22/15  1000 History   First MD Initiated Contact with Patient 09/22/15 1001     Chief Complaint  Patient presents with  . Chest Pain     (Consider location/radiation/quality/duration/timing/severity/associated sxs/prior Treatment) HPI Patient presents with chronic chest pain. States it worsened 2 hours ago. It is on the left side and does not radiate. No new trauma. No shortness of breath. No cough,  fever or chills. No new lower extremity swelling or pain. Patient was seen earlier this morning and had complete workup including chest x-ray, labs and EKG. No acute abnormalities found. Patient's been seen multiple times in the emergency department for similar complaints. He is requesting Darvocet for his pain. States that's what he normally takes. Past Medical History  Diagnosis Date  . Depression   . Fall at nursing home     December 2014  . History of migraine   . Gout attack   . Complete heart block ([place])     St. Jude pacemaker - followed by Dr. Lovena Le  . Chronic atrial fibrillation ([place])   . HLD (hyperlipidemia)   . GIB (gastrointestinal bleeding)   . BPH (benign prostatic hyperplasia)   . Essential hypertension   . Gastric ulcer   . Non Hodgkin's lymphoma ([place])   . Tubular adenoma of colon   . Aspiration pneumonia ([place]) 10/07/2014  . 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 Atypical angina ([place]) 12/01/2014  . BLOCK, AV, COMPLETE 12/24/2006    Annotation: Pacer placed Qualifier: Diagnosis of  By: Hilma Favors  DO, Beth    . 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 Fracture femur, cervicotrochanteric ([place]) 05/27/2013   Past Surgical History  Procedure Laterality Date  . Pacemaker generator change      Dr. Lovena Le  . Appendectomy    . Cholecystectomy    . Open reduction of hip Right 05/28/2013   Procedure: OPEN REDUCTION OF HIP;  Surgeon: Renette Butters, MD;  Location: [facility];  Service: Orthopedics;  Laterality: Right;  . Esophagogastroduodenoscopy (egd) with propofol N/A 09/26/2014    Procedure: ESOPHAGOGASTRODUODENOSCOPY (EGD) WITH PROPOFOL;  Surgeon: Jerene Bears, MD;  Location: [facility] ENDOSCOPY;  Service: Endoscopy;  Laterality: N/A;  . Colonoscopy N/A 10/01/2014    Procedure: COLONOSCOPY;  Surgeon: Jerene Bears, MD;  Location: [facility] ENDOSCOPY;  Service: Endoscopy;  Laterality: N/A;  . Partial colectomy N/A 10/05/2014    Procedure: SUBTOTAL COLECTOMY WITH ILEORECTAL ANASTOMOSIS;  Surgeon: Donnie Mesa, MD;  Location: [facility];  Service: General;  Laterality: N/A;  . Flexible sigmoidoscopy N/A 11/07/2014    Procedure: FLEXIBLE SIGMOIDOSCOPY;  Surgeon: Jerene Bears, MD;  Location[facility] ENDOSCOPY;  Service: Endoscopy;  Laterality: N/A;  . Gastric resection    . Flexible sigmoidoscopy N/A 07/30/2015    Procedure: FLEXIBLE SIGMOIDOSCOPY;  Surgeon: Doran Stabler, MD;  Location: [facility] ENDOSCOPY;  Service: Endoscopy;  Laterality: N/A;  . Transanal excision of rectal mass N/A 08/03/2015    Procedure: TRANSANAL MINIMALLY INVASIVE SURGERY TO RESECT RECTAL POLYP;  Surgeon: [name], MD;  Location: [facility] ORS;  Service: General;  Laterality: N/A;  . Flexible sigmoidoscopy N/A 08/03/2015    Procedure: FLEXIBLE SIGMOIDOSCOPY;  Surgeon: [name], MD;  Location: [facility] ORS;  Service: General;  Laterality: N/A;   Family History  Problem Relation Age of Onset  . Colon cancer Mother    Social History  Substance Use Topics  . Smoking status: Current Every Day Smoker -- 0.50 packs/day for 66 years    Types: Cigarettes  . Smokeless tobacco: Former Systems developer    Types: Snuff, Chew     Comment: 05/27/2013 "haven't used chew or snuff in ~ 30 yr"  . Alcohol Use: 0.0 oz/week    0 Standard drinks or equivalent per week     Comment: "I drink a fifth of bourbon a month" Hadn't had anything in 2 months    Review of  Systems  Constitutional: Negative for fever and chills.  Respiratory: Negative for cough and shortness of breath.   Cardiovascular: Positive for chest pain.  Gastrointestinal: Negative for nausea, vomiting and abdominal pain.  Musculoskeletal: Positive for myalgias. Negative for back pain, neck pain and neck stiffness.  Skin: Negative for rash and wound.  Neurological: Negative for dizziness, weakness, light-headedness, numbness and headaches.  All other systems reviewed and are negative.     Allergies  Review of patient's allergies indicates no known allergies.  Home Medications   Prior to Admission medications   Medication Sig Start Date End Date Taking? Authorizing Provider  albuterol (PROVENTIL HFA;VENTOLIN HFA) 108 (90 Base) MCG/ACT inhaler Inhale 2 puffs into the lungs every 4 (four) hours as needed for wheezing or shortness of breath. Patient not taking: Reported on 09/21/2015 08/07/15   Venetia Maxon Rama, MD  carvedilol (COREG) 6.25 MG tablet Take 1 tablet (6.25 mg total) by mouth 2 (two) times daily with a meal. 11/16/14   Velvet Bathe, MD  feeding supplement, ENSURE ENLIVE, (ENSURE ENLIVE) LIQD Take 237 mLs by mouth 2 (two) times daily between meals. 08/07/15   Venetia Maxon Rama, MD  oxyCODONE (OXY IR/ROXICODONE) 5 MG immediate release tablet Take 1 tablet (5 mg total) by mouth every 6 (six) hours as needed for severe pain. 08/07/15   Christina P Rama, MD   BP 123/75 mmHg  Pulse 94  Temp(Src) 99.8 F (37.7 C) (Oral)  Resp 19  SpO2 100% Physical Exam  Constitutional: He is oriented to person, place, and time. He appears well-developed and well-nourished. No distress.  HENT:  Head: Normocephalic and atraumatic.  Mouth/Throat: Oropharynx is clear and moist.  Eyes: EOM are normal. Pupils are equal, round, and reactive to light.  Neck: Normal range of motion. Neck supple.  Cardiovascular: Normal rate.  Exam reveals no gallop and no friction rub.   No murmur heard. Irregularly  irregular  Pulmonary/Chest: Effort normal and breath sounds normal. No respiratory distress. He has no wheezes. He has no rales. He exhibits tenderness (chest tenderness is completely reproduced with palpation over the left chest. There is no evidence of trauma. No crepitance or deformity.).  Abdominal: Soft. Bowel sounds are normal. He exhibits no distension and no mass. There is no tenderness. There is no rebound and no guarding.  Musculoskeletal: Normal range of motion. He exhibits no edema or tenderness.  No lower extremity asymmetry, swelling or calf tenderness.  Neurological: He is alert and oriented to person, place, and time.  Moving all extremities without deficit. Sensation is fully intact.  Skin: Skin is warm and dry. No rash noted. No erythema.  Psychiatric: He has a normal mood and affect. His behavior is normal.  Nursing note and vitals reviewed.   ED Course  Procedures (including critical care time) Labs Review Labs Reviewed - No data to display  Imaging Review Dg  Chest 2 View  09/22/2015  CLINICAL DATA:  Acute onset of generalized chest pain. Initial encounter. EXAM: CHEST  2 VIEW COMPARISON:  Chest radiograph performed 09/01/2015 FINDINGS: The lungs are hyperexpanded, with flattening of the hemidiaphragms, compatible with COPD. There is no evidence of focal opacification, pleural effusion or pneumothorax. The cardiomediastinal silhouette is borderline normal in size. A pacemaker is noted ending at the right chest wall, with leads ending overlying the right atrium and right ventricle. No acute osseous abnormalities are seen. Clips are noted within the right upper quadrant, reflecting prior cholecystectomy. IMPRESSION: Findings of COPD.  Lungs remain grossly clear. Electronically Signed   By: Garald Balding M.D.   On: 09/22/2015 03:21   Ct Head Wo Contrast  09/22/2015  CLINICAL DATA:  [place] on the sidewalk, striking his head. EXAM: CT HEAD WITHOUT CONTRAST TECHNIQUE: Contiguous  axial images were obtained from the base of the skull through the vertex without intravenous contrast. COMPARISON:  03/03/2015 FINDINGS: There is no intracranial hemorrhage, mass or evidence of acute infarction. There is mild generalized atrophy. There is mild chronic microvascular ischemic change. There are physiologic basal ganglia calcifications. There is no significant extra-axial fluid collection. No acute intracranial findings are evident. No interval change is evident from 03/03/2015. Calvarium and skullbase are intact. Orbits are intact. IMPRESSION: Negative for acute intracranial traumatic injury. Mild generalized atrophy and mild white matter hypodensity suggesting small vessel disease. Electronically Signed   By: Andreas Newport M.D.   On: 09/22/2015 04:26   I have personally reviewed and evaluated these images and lab results as part of my medical decision-making.   EKG Interpretation   Date/Time:  Friday September 22 2015 10:02:34 EDT Ventricular Rate:  95 PR Interval:    QRS Duration: 99 QT Interval:  353 QTC Calculation: 444 R Axis:   -112 Text Interpretation:  Atrial fibrillation Left anterior fascicular block  Probable anterior infarct, age indeterminate Confirmed by Lita Mains  MD,  Raylon Lamson ([id number]) on 09/22/2015 10:32:15 AM      MDM   Final diagnoses:  Chronic pain  Left-sided chest wall pain    Patient presents with chronic chest pain. Is reproduced and appears to be muscle skeletal in nature. He is requesting narcotics. EKG without any acute changes. Given that he had complete workup several hours prior to not believe that repeat is necessary. Discharged home with return precautions.    Julianne Rice, MD 09/22/15 1044

## 2015-09-22 NOTE — Progress Notes (Signed)
ED Cm received a call from a Sgt bowles sent from ED unit secretary Pt found trespassing on a property and informed this sgt he was given a place to go when he was at [facility] last Cm looked through chart review to find out pt was last seen at [facility] on 09/20/15-09/21/15 with ED SW attempting to get [facility] into [facility] but no available beds at that time Pt was given list of shelters and ask to try shelter again  Cm offered [place]. [place], [place] [address] [phone] Discussed ED SW spoke with Legrand Como and Micheal Likens to attempt to assist pt with getting to shelter vs jail for trespassing

## 2015-09-22 NOTE — ED Notes (Signed)
Pt here via EMS: here for chest pain, onset 9 hours ago. Was seen at [facility] earlier today for same. EMS reports pt is in A-flutter with rate of 110. BP-134/64, HR-110, O2-98% RA.

## 2015-09-22 NOTE — ED Notes (Signed)
To ED via [facility] -- [place]-- pt was discharged at [facility] for same-- pt states pain went away this am, but now is back-- pt has moist sounding cough-- productive for yellow stuff.

## 2015-09-22 NOTE — Discharge Instructions (Signed)

## 2015-09-22 NOTE — ED Notes (Signed)
Given [place] sandwich meal, coke, and bus pass.

## 2015-09-22 NOTE — Progress Notes (Signed)
CSW was consulted to speak with patient due to homelessness. CSW met with patient at bedside. Patient was alert and oriented. Also per note, there was a psychiatric evaluation completed 08/04/15 and patient was found competent.  There was no family present.   Patient confirms that he is homeless. CSW offered to find patient a bed at homeless shelter, and patient was agreeable. CSW reached out to [facility] who states that the patient is welcomed to come tonight.  CSW informed patient. CSW also provided patient with taxi voucher.   CSW provided patient with community resources.  CSW made EDP aware. CSW made nurse aware.   , LCSWA [phone] ED CSW 09/22/2015 6:40 PM    

## 2015-09-23 ENCOUNTER — Encounter (HOSPITAL_COMMUNITY): Payer: Self-pay | Admitting: *Deleted

## 2015-09-23 ENCOUNTER — Emergency Department (HOSPITAL_COMMUNITY)
Admission: EM | Admit: 2015-09-23 | Discharge: 2015-09-23 | Disposition: A | Discharge status: Home / Self Care | Payer: Medicare Other | Attending: Emergency Medicine | Admitting: Emergency Medicine

## 2015-09-23 ENCOUNTER — Emergency Department (HOSPITAL_COMMUNITY)
Admission: EM | Admit: 2015-09-23 | Discharge: 2015-09-24 | Disposition: A | Discharge status: Home / Self Care | Payer: Medicare Other | Source: Home / Self Care | Attending: Emergency Medicine | Admitting: Emergency Medicine

## 2015-09-23 ENCOUNTER — Encounter (HOSPITAL_COMMUNITY): Payer: Self-pay | Admitting: Emergency Medicine

## 2015-09-23 DIAGNOSIS — J441 Chronic obstructive pulmonary disease with (acute) exacerbation: Secondary | ICD-10-CM | POA: Diagnosis not present

## 2015-09-23 DIAGNOSIS — I482 Chronic atrial fibrillation: Secondary | ICD-10-CM

## 2015-09-23 DIAGNOSIS — Z8639 Personal history of other endocrine, nutritional and metabolic disease: Secondary | ICD-10-CM

## 2015-09-23 DIAGNOSIS — Z86010 Personal history of colonic polyps: Secondary | ICD-10-CM | POA: Insufficient documentation

## 2015-09-23 DIAGNOSIS — Z86018 Personal history of other benign neoplasm: Secondary | ICD-10-CM | POA: Diagnosis not present

## 2015-09-23 DIAGNOSIS — Z8781 Personal history of (healed) traumatic fracture: Secondary | ICD-10-CM | POA: Insufficient documentation

## 2015-09-23 DIAGNOSIS — F17210 Nicotine dependence, cigarettes, uncomplicated: Secondary | ICD-10-CM | POA: Insufficient documentation

## 2015-09-23 DIAGNOSIS — Z8572 Personal history of non-Hodgkin lymphomas: Secondary | ICD-10-CM | POA: Insufficient documentation

## 2015-09-23 DIAGNOSIS — Z8659 Personal history of other mental and behavioral disorders: Secondary | ICD-10-CM

## 2015-09-23 DIAGNOSIS — I25119 Atherosclerotic heart disease of native coronary artery with unspecified angina pectoris: Secondary | ICD-10-CM | POA: Insufficient documentation

## 2015-09-23 DIAGNOSIS — Z8719 Personal history of other diseases of the digestive system: Secondary | ICD-10-CM | POA: Insufficient documentation

## 2015-09-23 DIAGNOSIS — R05 Cough: Secondary | ICD-10-CM

## 2015-09-23 DIAGNOSIS — R0789 Other chest pain: Secondary | ICD-10-CM | POA: Insufficient documentation

## 2015-09-23 DIAGNOSIS — Z8739 Personal history of other diseases of the musculoskeletal system and connective tissue: Secondary | ICD-10-CM | POA: Insufficient documentation

## 2015-09-23 DIAGNOSIS — Z79899 Other long term (current) drug therapy: Secondary | ICD-10-CM | POA: Insufficient documentation

## 2015-09-23 DIAGNOSIS — I252 Old myocardial infarction: Secondary | ICD-10-CM

## 2015-09-23 DIAGNOSIS — I10 Essential (primary) hypertension: Secondary | ICD-10-CM | POA: Insufficient documentation

## 2015-09-23 DIAGNOSIS — R0602 Shortness of breath: Secondary | ICD-10-CM

## 2015-09-23 DIAGNOSIS — G8929 Other chronic pain: Secondary | ICD-10-CM | POA: Insufficient documentation

## 2015-09-23 DIAGNOSIS — Z87438 Personal history of other diseases of male genital organs: Secondary | ICD-10-CM

## 2015-09-23 DIAGNOSIS — R079 Chest pain, unspecified: Principal | ICD-10-CM

## 2015-09-23 DIAGNOSIS — Z950 Presence of cardiac pacemaker: Secondary | ICD-10-CM | POA: Insufficient documentation

## 2015-09-23 DIAGNOSIS — R062 Wheezing: Secondary | ICD-10-CM | POA: Insufficient documentation

## 2015-09-23 DIAGNOSIS — Z8701 Personal history of pneumonia (recurrent): Secondary | ICD-10-CM

## 2015-09-23 MED ORDER — PREDNISONE 20 MG PO TABS: 40.0000 mg | ORAL_TABLET | Freq: Every day | ORAL | Status: DC

## 2015-09-23 MED ORDER — PREDNISONE 20 MG PO TABS
60.0000 mg | ORAL_TABLET | Freq: Once | ORAL | Status: AC
Start: 2015-09-23 — End: 2015-09-23
  Administered 2015-09-23: 60 mg via ORAL
  Filled 2015-09-23: qty 3

## 2015-09-23 MED ORDER — CARVEDILOL 6.25 MG PO TABS: 6.2500 mg | ORAL_TABLET | Freq: Two times a day (BID) | ORAL | Status: AC

## 2015-09-23 MED ORDER — ALBUTEROL SULFATE (2.5 MG/3ML) 0.083% IN NEBU
5.0000 mg | INHALATION_SOLUTION | Freq: Once | RESPIRATORY_TRACT | Status: AC
  Administered 2015-09-23: 5 mg via RESPIRATORY_TRACT
  Filled 2015-09-23: qty 6

## 2015-09-23 MED ORDER — ALBUTEROL SULFATE HFA 108 (90 BASE) MCG/ACT IN AERS
1.0000 | INHALATION_SPRAY | RESPIRATORY_TRACT | Status: DC | PRN
  Filled 2015-09-23: qty 6.7

## 2015-09-23 MED ORDER — IPRATROPIUM BROMIDE 0.02 % IN SOLN
0.5000 mg | Freq: Once | RESPIRATORY_TRACT | Status: AC
  Administered 2015-09-23: 0.5 mg via RESPIRATORY_TRACT
  Filled 2015-09-23: qty 2.5

## 2015-09-23 NOTE — ED Provider Notes (Addendum)
CSN: [id number]     Arrival date & time 09/23/15  1427 History   First MD Initiated Contact with Patient 09/23/15 1519     Chief Complaint  Patient presents with  . Chest Pain     (Consider location/radiation/quality/duration/timing/severity/associated sxs/prior Treatment) Patient is a 70 y.o. male presenting with chest pain. The history is provided by the patient.  Chest Pain Pain location:  L chest Pain quality: aching and tightness   Pain radiates to:  Does not radiate Pain radiates to the back: no   Pain severity:  Moderate Onset quality:  Gradual Duration:  1 hour Timing:  Constant Progression:  Resolved Chronicity:  Chronic Context comment:  Walking to the gas station and started feeling SOB and chest tightness so laid down Relieved by:  Aspirin and rest Worsened by:  Exertion Ineffective treatments:  None tried Associated symptoms: cough and shortness of breath   Associated symptoms: no abdominal pain, no back pain, no fatigue, no fever, no nausea, no palpitations, no syncope, not vomiting and no weakness   Risk factors: coronary artery disease   Risk factors: no diabetes mellitus   Risk factors comment:  Cardiomyopathy, AV block with pacemaker, GI bleed, chronic atrial fibrillation   Past Medical History  Diagnosis Date  . Depression   . Fall at nursing home     December 2014  . History of migraine   . Gout attack   . Complete heart block ([place])     St. Jude pacemaker - followed by Dr. Lovena Le  . Chronic atrial fibrillation ([place])   . HLD (hyperlipidemia)   . GIB (gastrointestinal bleeding)   . BPH (benign prostatic hyperplasia)   . Essential hypertension   . Gastric ulcer   . Non Hodgkin's lymphoma ([place])   . Tubular adenoma of colon   . Aspiration pneumonia ([place]) 10/07/2014  . 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 Atypical angina ([place]) 12/01/2014  . BLOCK, AV, COMPLETE 12/24/2006    Annotation: Pacer placed  Qualifier: Diagnosis of  By: Hilma Favors  DO, Beth    . 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 Fracture femur, cervicotrochanteric ([place]) 05/27/2013   Past Surgical History  Procedure Laterality Date  . Pacemaker generator change      Dr. Lovena Le  . Appendectomy    . Cholecystectomy    . Open reduction of hip Right 05/28/2013    Procedure: OPEN REDUCTION OF HIP;  Surgeon: Renette Butters, MD;  Location: [facility];  Service: Orthopedics;  Laterality: Right;  . Esophagogastroduodenoscopy (egd) with propofol N/A 09/26/2014    Procedure: ESOPHAGOGASTRODUODENOSCOPY (EGD) WITH PROPOFOL;  Surgeon: Jerene Bears, MD;  Location: [facility] ENDOSCOPY;  Service: Endoscopy;  Laterality: N/A;  . Colonoscopy N/A 10/01/2014    Procedure: COLONOSCOPY;  Surgeon: Jerene Bears, MD;  Location: [facility] ENDOSCOPY;  Service: Endoscopy;  Laterality: N/A;  . Partial colectomy N/A 10/05/2014    Procedure: SUBTOTAL COLECTOMY WITH ILEORECTAL ANASTOMOSIS;  Surgeon: Donnie Mesa, MD;  Location: [facility];  Service: General;  Laterality: N/A;  . Flexible sigmoidoscopy N/A 11/07/2014    Procedure: FLEXIBLE SIGMOIDOSCOPY;  Surgeon: Jerene Bears, MD;  Location: [facility] ENDOSCOPY;  Service: Endoscopy;  Laterality: N/A;  . Gastric resection    . Flexible sigmoidoscopy N/A 07/30/2015    Procedure: FLEXIBLE SIGMOIDOSCOPY;  Surgeon: Nelida Meuse  III, MD;  Location: [facility] ENDOSCOPY;  Service: Endoscopy;  Laterality: N/A;  . Transanal excision of rectal mass N/A 08/03/2015    Procedure: TRANSANAL MINIMALLY INVASIVE SURGERY TO RESECT RECTAL POLYP;  Surgeon: [name], MD;  Location: [facility] ORS;  Service: General;  Laterality: N/A;  . Flexible sigmoidoscopy N/A 08/03/2015    Procedure: FLEXIBLE SIGMOIDOSCOPY;  Surgeon: [name], MD;  Location: [facility] ORS;  Service: General;  Laterality: N/A;   Family History  Problem Relation Age of Onset  . Colon cancer Mother    Social  History  Substance Use Topics  . Smoking status: Current Every Day Smoker -- 0.50 packs/day for 66 years    Types: Cigarettes  . Smokeless tobacco: Former Systems developer    Types: Snuff, Chew     Comment: 05/27/2013 "haven't used chew or snuff in ~ 30 yr"  . Alcohol Use: 0.0 oz/week    0 Standard drinks or equivalent per week     Comment: "I drink a fifth of bourbon a month" Hadn't had anything in 2 months    Review of Systems  Constitutional: Negative for fever and fatigue.  Respiratory: Positive for cough and shortness of breath.   Cardiovascular: Positive for chest pain. Negative for palpitations and syncope.  Gastrointestinal: Negative for nausea, vomiting and abdominal pain.  Musculoskeletal: Negative for back pain.  Neurological: Negative for weakness.  All other systems reviewed and are negative.     Allergies  Review of patient's allergies indicates no known allergies.  Home Medications   Prior to Admission medications   Medication Sig Start Date End Date Taking? Authorizing Provider  albuterol (PROVENTIL HFA;VENTOLIN HFA) 108 (90 Base) MCG/ACT inhaler Inhale 2 puffs into the lungs every 4 (four) hours as needed for wheezing or shortness of breath. Patient not taking: Reported on 09/21/2015 08/07/15   Venetia Maxon Rama, MD  carvedilol (COREG) 6.25 MG tablet Take 1 tablet (6.25 mg total) by mouth 2 (two) times daily with a meal. 11/16/14   Velvet Bathe, MD  feeding supplement, ENSURE ENLIVE, (ENSURE ENLIVE) LIQD Take 237 mLs by mouth 2 (two) times daily between meals. 08/07/15   Venetia Maxon Rama, MD  oxyCODONE (OXY IR/ROXICODONE) 5 MG immediate release tablet Take 1 tablet (5 mg total) by mouth every 6 (six) hours as needed for severe pain. 08/07/15   Christina P Rama, MD   BP 114/69 mmHg  Pulse 100  Temp(Src) 98.7 F (37.1 C) (Oral)  Resp 20  Ht 5\' 9"  (1.753 m)  SpO2 99% Physical Exam  Constitutional: He is oriented to person, place, and time. He appears well-developed and  well-nourished. No distress.  HENT:  Head: Normocephalic and atraumatic.  Mouth/Throat: Oropharynx is clear and moist.  Eyes: Conjunctivae and EOM are normal. Pupils are equal, round, and reactive to light.  Neck: Normal range of motion. Neck supple.  Cardiovascular: Normal rate, regular rhythm and intact distal pulses.   No murmur heard. Pulmonary/Chest: Effort normal. No respiratory distress. He has wheezes. He has no rales. He exhibits no tenderness.  Abdominal: Soft. He exhibits no distension. There is no tenderness. There is no rebound and no guarding.  Musculoskeletal: Normal range of motion. He exhibits no edema or tenderness.  Neurological: He is alert and oriented to person, place, and time.  Skin: Skin is warm and dry. No rash noted. No erythema.  Notable sores over the face and arms that are very stages of healing. Appears to pick at them  Psychiatric: He has a normal mood  and affect. His behavior is normal.  Nursing note and vitals reviewed.   ED Course  Procedures (including critical care time) Labs Review Labs Reviewed - No data to display  Imaging Review Dg Chest 2 View  09/22/2015  CLINICAL DATA:  Acute onset of generalized chest pain. Initial encounter. EXAM: CHEST  2 VIEW COMPARISON:  Chest radiograph performed 09/01/2015 FINDINGS: The lungs are hyperexpanded, with flattening of the hemidiaphragms, compatible with COPD. There is no evidence of focal opacification, pleural effusion or pneumothorax. The cardiomediastinal silhouette is borderline normal in size. A pacemaker is noted ending at the right chest wall, with leads ending overlying the right atrium and right ventricle. No acute osseous abnormalities are seen. Clips are noted within the right upper quadrant, reflecting prior cholecystectomy. IMPRESSION: Findings of COPD.  Lungs remain grossly clear. Electronically Signed   By: Garald Balding M.D.   On: 09/22/2015 03:21   Ct Head Wo Contrast  09/22/2015  CLINICAL  DATA:  [place] on the sidewalk, striking his head. EXAM: CT HEAD WITHOUT CONTRAST TECHNIQUE: Contiguous axial images were obtained from the base of the skull through the vertex without intravenous contrast. COMPARISON:  03/03/2015 FINDINGS: There is no intracranial hemorrhage, mass or evidence of acute infarction. There is mild generalized atrophy. There is mild chronic microvascular ischemic change. There are physiologic basal ganglia calcifications. There is no significant extra-axial fluid collection. No acute intracranial findings are evident. No interval change is evident from 03/03/2015. Calvarium and skullbase are intact. Orbits are intact. IMPRESSION: Negative for acute intracranial traumatic injury. Mild generalized atrophy and mild white matter hypodensity suggesting small vessel disease. Electronically Signed   By: Andreas Newport M.D.   On: 09/22/2015 04:26   I have personally reviewed and evaluated these images and lab results as part of my medical decision-making.   EKG Interpretation   Date/Time:  Saturday September 23 2015 15:41:08 EDT Ventricular Rate:  81 PR Interval:    QRS Duration: 105 QT Interval:  340 QTC Calculation: 395 R Axis:   162 Text Interpretation:  Atrial fibrillation Anterior infarct, old No  significant change since last tracing Confirmed by [facility]  MD, Loree Fee  [phone]) on 09/23/2015 3:48:36 PM      MDM   Final diagnoses:  Chronic obstructive pulmonary disease with acute exacerbation [facility])    Patient is a 70 year old male who has multiple visits to the emergency room and has a care plan for chronic chest pain, COPD, medical noncompliance and homelessness. Patient has been seen daily for the last 3 days. Every time is spent for chest pain which he endorses today. The pain occurs when he walks and he become short of breath. Pain is no different today than it was the last 3 days. He denies any symptoms associated with eating. He currently does not use an inhaler  and has not seen his cardiologist. EKG is unchanged today with chronic atrial fibrillation. Patient does have wheezing bilaterally and his chest pain is most likely related to COPD.  Patient has had a chest x-ray, troponin, CBC, BMP for the last 3 days in a row that have all been normal. These will not be repeated today. He will not get a repeat chest x-ray as he has had 2 in the last week that have been normal. Patient was given albuterol, Atrovent and a screening EKG. Patient will be discharged with prednisone and albuterol. He will be encouraged to follow-up with health and wellness in his cardiologist.  4:51 PM Patient's lungs are  clear after neb treatment. He states his symptoms have completely resolved and he feels much better. He was sent home with albuterol, prednisone and perception for his blood pressure medication which he was out of.  Blanchie Dessert, MD 09/23/15 [place], MD 09/23/15 [place], MD 09/23/15 1654

## 2015-09-23 NOTE — ED Notes (Signed)
[place] via EMS from [place] with c/o left sided chest pain onset yesterday. Pt reports pain is constant since yesterday. Pt denies any other symptoms. Pt seen here yesterday for same. Pt presents in dirty blue hospital scrubs and dirty yellow socks. Pt has abrasions to right side of face, reports fell last week.

## 2015-09-23 NOTE — ED Notes (Signed)
The pt is c/o chest pain  He just left this ed  2-3 hours ago for the same thing

## 2015-09-23 NOTE — ED Notes (Signed)
MD at bedside. 

## 2015-09-23 NOTE — ED Provider Notes (Signed)
By signing my name below, I, Doran Stabler, attest that this documentation has been prepared under the direction and in the presence of [employer], DO. Electronically Signed: Doran Stabler, ED Scribe. 09/23/2015. 11:46 PM.  TIME SEEN: 11:14 PM  CHIEF COMPLAINT:  Chief Complaint  Patient presents with  . Chest Pain   HPI:  HPI Comments: [name] is a 70 y.o. male who presents to the Emergency Department with a PMHx of atrial fibrillation On aspirin, complete heart block status post pacemaker placement, hyperlipidemia, hypertension, COPD, homelessness complaining of left sided chest pain, that worsened today. Pt states he always has intermittent chest pain. States he has had this since the 1980s and that is unchanged. Describes it as sharp in nature. Reports that he does sometimes dissipate but has been constant all day. Pt denies any aggravating or alleviating factors. Pt denies any SOB, fevers, vomiting, diarrhea, diaphoresis or any other symptoms at this time. Has a chronic unchanged cough. Pt is homeless.  ROS: See HPI Constitutional: no fever  Eyes: no drainage  ENT: no runny nose   Cardiovascular:   chest pain  Resp: no SOB  GI: no vomiting GU: no dysuria Integumentary: no rash  Allergy: no hives  Musculoskeletal: no leg swelling  Neurological: no slurred speech ROS otherwise negative  PAST MEDICAL HISTORY/PAST SURGICAL HISTORY:  Past Medical History  Diagnosis Date  . Depression   . Fall at nursing home     December 2014  . History of migraine   . Gout attack   . Complete heart block ([place])     St. Jude pacemaker - followed by Dr. Lovena Le  . Chronic atrial fibrillation ([place])   . HLD (hyperlipidemia)   . GIB (gastrointestinal bleeding)   . BPH (benign prostatic hyperplasia)   . Essential hypertension   . Gastric ulcer   . Non Hodgkin's lymphoma ([place])   . Tubular adenoma of colon   . Aspiration pneumonia ([place]) 10/07/2014  . 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 Atypical angina ([place]) 12/01/2014  . BLOCK, AV, COMPLETE 12/24/2006    Annotation: Pacer placed Qualifier: Diagnosis of  By: Hilma Favors  DO, Beth    . 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 Fracture femur, cervicotrochanteric ([place]) 05/27/2013    MEDICATIONS:  Prior to Admission medications   Medication Sig Start Date End Date Taking? Authorizing Provider  albuterol (PROVENTIL HFA;VENTOLIN HFA) 108 (90 Base) MCG/ACT inhaler Inhale 2 puffs into the lungs every 4 (four) hours as needed for wheezing or shortness of breath. 08/07/15   Venetia Maxon Rama, MD  carvedilol (COREG) 6.25 MG tablet Take 1 tablet (6.25 mg total) by mouth 2 (two) times daily with a meal. 09/23/15   Blanchie Dessert, MD  feeding supplement, ENSURE ENLIVE, (ENSURE ENLIVE) LIQD Take 237 mLs by mouth 2 (two) times daily between meals. 08/07/15   Venetia Maxon Rama, MD  oxyCODONE (OXY IR/ROXICODONE) 5 MG immediate release tablet Take 1 tablet (5 mg total) by mouth every 6 (six) hours as needed for severe pain. 08/07/15   Venetia Maxon Rama, MD  predniSONE (DELTASONE) 20 MG tablet Take 2 tablets (40 mg total) by mouth daily. 09/23/15   Blanchie Dessert, MD    ALLERGIES:  No Known Allergies  SOCIAL HISTORY:  Social History  Substance Use Topics  .  Smoking status: Current Every Day Smoker -- 0.50 packs/day for 66 years    Types: Cigarettes  . Smokeless tobacco: Former Systems developer    Types: Snuff, Chew     Comment: 05/27/2013 "haven't used chew or snuff in ~ 30 yr"  . Alcohol Use: 0.0 oz/week    0 Standard drinks or equivalent per week     Comment: "I drink a fifth of bourbon a month" Hadn't had anything in 2 months    FAMILY HISTORY: Family History  Problem Relation Age of Onset  . Colon cancer Mother     EXAM: BP 119/58 mmHg  Pulse 96  Temp(Src) 98.4 F (36.9 C) (Oral)  Resp 23  SpO2  99% CONSTITUTIONAL: Alert and oriented and responds appropriately to questions. Well-appearing; thin. HEAD: Normocephalic EYES: Conjunctivae clear, PERRL ENT: normal nose; no rhinorrhea; moist mucous membranes; pharynx without lesions noted NECK: Supple, no meningismus, no LAD  CARD: irregularly irregular; S1 and S2 appreciated; no murmurs, no clicks, no rubs, no gallops RESP: Normal chest excursion without splinting or tachypnea; breath sounds clear and equal bilaterally; no wheezes, no rhonchi, no rales, or respiratory distress, speaking full sentences ABD/GI: Normal bowel sounds; non-distended; soft, non-tender, no rebound, no guarding BACK:  The back appears normal and is non-tender to palpation, there is no CVA tenderness EXT: Normal ROM in all joints; non-tender to palpation; no edema; normal capillary refill; no cyanosis    SKIN: Normal color for age and race; warm, multiple abrasions to the face and RUE.  NEURO: Moves all extremities equally, normal gait PSYCH: The patient's mood and manner are appropriate. Grooming and personal hygiene are appropriate.  MEDICAL DECISION MAKING: Patient here with chronic chest pain. He is hemodynamically stable. EKG shows no new significant change. We'll obtain a troponin. He does have her risk factors for ACS but is here frequently. Has had many multiple negative troponins. Last chest x-ray was negative on April 14. Patient does have a care plan. Have advised him that we will likely not get rid of his pain in the emergency department he is comfortable with this.  ED PROGRESS:  Patient remains hemodynamically stable. Troponin is negative. He is eating and drinking. Common, cooperative and pleasant. I do not feel this time he needs further emergent workup for his chronic chest pain. I feel he is safe for discharge. Provided outpatient follow-up resources. Discussed return precautions.   At this time, I do not feel there is any life-threatening condition  present. I have reviewed and discussed all results (EKG, imaging, lab, urine as appropriate), exam findings with patient. I have reviewed nursing notes and appropriate previous records.  I feel the patient is safe to be discharged home without further emergent workup. Discussed usual and customary return precautions. Patient and family (if present) verbalize understanding and are comfortable with this plan.  Patient will follow-up with their primary care provider. If they do not have a primary care provider, information for follow-up has been provided to them. All questions have been answered.     EKG Interpretation  Date/Time:  Saturday September 23 2015 22:56:23 EDT Ventricular Rate:  92 PR Interval:    QRS Duration: 97 QT Interval:  358 QTC Calculation: 443 R Axis:   136 Text Interpretation:  Atrial fibrillation Probable anterior infarct, age indeterminate Abnormal T, consider ischemia, inferior leads No significant change since last tracing Confirmed by WARD,  DO, KRISTEN [phone]) on 09/23/2015 11:11:34 PM         I personally performed  the services described in this documentation, which was scribed in my presence. The recorded information has been reviewed and is accurate.   [place], DO 09/24/15 0045

## 2015-09-24 ENCOUNTER — Emergency Department (HOSPITAL_COMMUNITY): Payer: Medicare Other

## 2015-09-24 ENCOUNTER — Emergency Department (HOSPITAL_COMMUNITY)
Admission: EM | Admit: 2015-09-24 | Discharge: 2015-09-25 | Disposition: A | Discharge status: Home / Self Care | Payer: Medicare Other | Source: Home / Self Care | Attending: Emergency Medicine | Admitting: Emergency Medicine

## 2015-09-24 ENCOUNTER — Emergency Department (HOSPITAL_COMMUNITY)
Admission: EM | Admit: 2015-09-24 | Discharge: 2015-09-24 | Discharge status: Against Medical Advice | Payer: Medicare Other | Source: Home / Self Care | Attending: Emergency Medicine | Admitting: Emergency Medicine

## 2015-09-24 ENCOUNTER — Encounter (HOSPITAL_COMMUNITY): Payer: Self-pay | Admitting: Emergency Medicine

## 2015-09-24 DIAGNOSIS — J449 Chronic obstructive pulmonary disease, unspecified: Secondary | ICD-10-CM | POA: Insufficient documentation

## 2015-09-24 DIAGNOSIS — Z8639 Personal history of other endocrine, nutritional and metabolic disease: Secondary | ICD-10-CM

## 2015-09-24 DIAGNOSIS — Z8659 Personal history of other mental and behavioral disorders: Secondary | ICD-10-CM | POA: Insufficient documentation

## 2015-09-24 DIAGNOSIS — Z8701 Personal history of pneumonia (recurrent): Secondary | ICD-10-CM

## 2015-09-24 DIAGNOSIS — Z8739 Personal history of other diseases of the musculoskeletal system and connective tissue: Secondary | ICD-10-CM

## 2015-09-24 DIAGNOSIS — Z79899 Other long term (current) drug therapy: Secondary | ICD-10-CM | POA: Insufficient documentation

## 2015-09-24 DIAGNOSIS — Z87438 Personal history of other diseases of male genital organs: Secondary | ICD-10-CM | POA: Insufficient documentation

## 2015-09-24 DIAGNOSIS — Z87828 Personal history of other (healed) physical injury and trauma: Secondary | ICD-10-CM | POA: Insufficient documentation

## 2015-09-24 DIAGNOSIS — Z8669 Personal history of other diseases of the nervous system and sense organs: Secondary | ICD-10-CM | POA: Insufficient documentation

## 2015-09-24 DIAGNOSIS — Z8572 Personal history of non-Hodgkin lymphomas: Secondary | ICD-10-CM | POA: Insufficient documentation

## 2015-09-24 DIAGNOSIS — I10 Essential (primary) hypertension: Secondary | ICD-10-CM | POA: Insufficient documentation

## 2015-09-24 DIAGNOSIS — J441 Chronic obstructive pulmonary disease with (acute) exacerbation: Secondary | ICD-10-CM | POA: Diagnosis not present

## 2015-09-24 DIAGNOSIS — Z8781 Personal history of (healed) traumatic fracture: Secondary | ICD-10-CM | POA: Insufficient documentation

## 2015-09-24 DIAGNOSIS — R079 Chest pain, unspecified: Secondary | ICD-10-CM | POA: Insufficient documentation

## 2015-09-24 DIAGNOSIS — Z8719 Personal history of other diseases of the digestive system: Secondary | ICD-10-CM | POA: Insufficient documentation

## 2015-09-24 DIAGNOSIS — Z950 Presence of cardiac pacemaker: Secondary | ICD-10-CM | POA: Insufficient documentation

## 2015-09-24 DIAGNOSIS — R0789 Other chest pain: Secondary | ICD-10-CM

## 2015-09-24 DIAGNOSIS — F17210 Nicotine dependence, cigarettes, uncomplicated: Secondary | ICD-10-CM

## 2015-09-24 LAB — I-STAT TROPONIN, ED: TROPONIN I, POC: 0 ng/mL (ref 0.00–0.08)

## 2015-09-24 LAB — TROPONIN I: Troponin I: <0.03 ng/mL (ref <0.031)

## 2015-09-24 MED ORDER — PREDNISONE 20 MG PO TABS
60.0000 mg | ORAL_TABLET | Freq: Once | ORAL | Status: AC
  Administered 2015-09-24: 60 mg via ORAL
  Filled 2015-09-24: qty 3

## 2015-09-24 MED ORDER — ALBUTEROL SULFATE (2.5 MG/3ML) 0.083% IN NEBU
5.0000 mg | INHALATION_SOLUTION | Freq: Once | RESPIRATORY_TRACT | Status: AC
  Administered 2015-09-24: 5 mg via RESPIRATORY_TRACT
  Filled 2015-09-24: qty 6

## 2015-09-24 NOTE — ED Notes (Signed)
Per [facility] pt from streets, reports chest wall/ sternum pain. Pt was seen at [facility] ED yesterday for similar symptoms. Per [facility] pt denies shortness of breath , nausea nor pain radiation. Pt alert and oriented x 4.

## 2015-09-24 NOTE — ED Notes (Signed)
Pt given sandwich and discharged to lobby, waiting for bus

## 2015-09-24 NOTE — ED Provider Notes (Signed)
CSN: [id number]     Arrival date & time 09/24/15  2116 History   First MD Initiated Contact with Patient 09/24/15 2122     Chief Complaint  Patient presents with  . Chest Pain     (Consider location/radiation/quality/duration/timing/severity/associated sxs/prior Treatment) Patient is a 70 y.o. male presenting with chest pain. The history is provided by the patient and medical records. No language interpreter was used.  Chest Pain Associated symptoms: cough   Associated symptoms: no abdominal pain, no back pain, no dizziness, no fever, no headache, no nausea, no shortness of breath and not vomiting    [name] is a 70 y.o. male  with a PMH of afib on ASA with pacemaker in place, HLD, HTN, COPD who presents to the Emergency Department complaining of central 6/10 non-radiating chest pain that began this morning. Patient used his home inhaler, no other medications taken PTA for symptoms. No alleviating or aggravating factors noted. Admits to cough which is at baseline, no increased sputum or increased frequency. Patient denies shortness of breath, diaphoresis, abdominal pain. Hx of the same and seen in ED multiple times for this complaint.   Past Medical History  Diagnosis Date  . Depression   . Fall at nursing home     December 2014  . History of migraine   . Gout attack   . Complete heart block ([place])     St. Jude pacemaker - followed by Dr. Lovena Le  . Chronic atrial fibrillation ([place])   . HLD (hyperlipidemia)   . GIB (gastrointestinal bleeding)   . BPH (benign prostatic hyperplasia)   . Essential hypertension   . Gastric ulcer   . Non Hodgkin's lymphoma ([place])   . Tubular adenoma of colon   . Aspiration pneumonia ([place]) 10/07/2014  . 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 Atypical angina ([place]) 12/01/2014  . BLOCK, AV, COMPLETE 12/24/2006    Annotation: Pacer placed Qualifier: Diagnosis of  By: Hilma Favors  DO, Beth    . 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 Fracture femur, cervicotrochanteric ([place]) 05/27/2013   Past Surgical History  Procedure Laterality Date  . Pacemaker generator change      Dr. Lovena Le  . Appendectomy    . Cholecystectomy    . Open reduction of hip Right 05/28/2013    Procedure: OPEN REDUCTION OF HIP;  Surgeon: Renette Butters, MD;  Location: [place];  Service: Orthopedics;  Laterality: Right;  . Esophagogastroduodenoscopy (egd) with propofol N/A 09/26/2014    Procedure: ESOPHAGOGASTRODUODENOSCOPY (EGD) WITH PROPOFOL;  Surgeon: Jerene Bears, MD;  Location: [facility] ENDOSCOPY;  Service: Endoscopy;  Laterality: N/A;  . Colonoscopy N/A 10/01/2014    Procedure: COLONOSCOPY;  Surgeon: Jerene Bears, MD;  Location: [facility] ENDOSCOPY;  Service: Endoscopy;  Laterality: N/A;  . Partial colectomy N/A 10/05/2014    Procedure: SUBTOTAL COLECTOMY WITH ILEORECTAL ANASTOMOSIS;  Surgeon: Donnie Mesa, MD;  Location: [place];  Service: General;  Laterality: N/A;  . Flexible sigmoidoscopy N/A 11/07/2014    Procedure: FLEXIBLE SIGMOIDOSCOPY;  Surgeon: Jerene Bears, MD;  Location[facility] END[facility]COPY;  Service: Endoscopy;  Laterality: N/A;  . Gastric resection    . Flexible sigmoidoscopy N/A 07/30/2015    Procedure: FLEXIBLE SIGMOIDOSCOPY;  Surgeon: Doran Stabler, MD;  Location: [facility] ENDOSCOPY;  Service: Endoscopy;  Laterality: N/A;  .  Transanal excision of rectal mass N/A 08/03/2015    Procedure: TRANSANAL MINIMALLY INVASIVE SURGERY TO RESECT RECTAL POLYP;  Surgeon: [name], MD;  Location: [facility] ORS;  Service: General;  Laterality: N/A;  . Flexible sigmoidoscopy N/A 08/03/2015    Procedure: FLEXIBLE SIGMOIDOSCOPY;  Surgeon: [name], MD;  Location: [facility] ORS;  Service: General;  Laterality: N/A;   Family History  Problem Relation Age of Onset  . Colon cancer Mother    Social History  Substance Use Topics  . Smoking status: Current Every Day  Smoker -- 0.50 packs/day for 66 years    Types: Cigarettes  . Smokeless tobacco: Former Systems developer    Types: Snuff, Chew     Comment: 05/27/2013 "haven't used chew or snuff in ~ 30 yr"  . Alcohol Use: 0.0 oz/week    0 Standard drinks or equivalent per week     Comment: "I drink a fifth of bourbon a month" Hadn't had anything in 2 months    Review of Systems  Constitutional: Negative for fever.  HENT: Negative for congestion.   Eyes: Negative for visual disturbance.  Respiratory: Positive for cough. Negative for shortness of breath.   Cardiovascular: Positive for chest pain.  Gastrointestinal: Negative for nausea, vomiting and abdominal pain.  Genitourinary: Negative for dysuria.  Musculoskeletal: Negative for back pain and neck pain.  Skin: Negative for rash.  Neurological: Negative for dizziness and headaches.      Allergies  Review of patient's allergies indicates no known allergies.  Home Medications   Prior to Admission medications   Medication Sig Start Date End Date Taking? Authorizing Provider  albuterol (PROVENTIL HFA;VENTOLIN HFA) 108 (90 Base) MCG/ACT inhaler Inhale 2 puffs into the lungs every 4 (four) hours as needed for wheezing or shortness of breath. 08/07/15   Venetia Maxon Rama, MD  carvedilol (COREG) 6.25 MG tablet Take 1 tablet (6.25 mg total) by mouth 2 (two) times daily with a meal. Patient not taking: Reported on 09/24/2015 09/23/15   Blanchie Dessert, MD  feeding supplement, ENSURE ENLIVE, (ENSURE ENLIVE) LIQD Take 237 mLs by mouth 2 (two) times daily between meals. 08/07/15   Venetia Maxon Rama, MD  oxyCODONE (OXY IR/ROXICODONE) 5 MG immediate release tablet Take 1 tablet (5 mg total) by mouth every 6 (six) hours as needed for severe pain. 08/07/15   Venetia Maxon Rama, MD  predniSONE (DELTASONE) 20 MG tablet Take 2 tablets (40 mg total) by mouth daily. Patient not taking: Reported on 09/24/2015 09/23/15   Blanchie Dessert, MD   BP 122/65 mmHg  Pulse 86  Temp(Src) 98.6  F (37 C) (Oral)  Resp 16  Ht 5\' 9"  (1.753 m)  Wt 40.824 kg  BMI 13.28 kg/m2  SpO2 96% Physical Exam  Constitutional: He is oriented to person, place, and time. He appears well-developed and well-nourished.  Alert and in no acute distress  HENT:  Head: Normocephalic and atraumatic.  Cardiovascular: Exam reveals no gallop and no friction rub.   No murmur heard. Irregularly irregular - hx of afib with pacemaker in place.   Pulmonary/Chest: Effort normal and breath sounds normal. No respiratory distress. He has no wheezes. He has no rales.  Abdominal: Soft. He exhibits no distension and no mass. There is no tenderness. There is no rebound and no guarding.  Musculoskeletal: He exhibits no edema.  Neurological: He is alert and oriented to person, place, and time.  Skin: Skin is warm and dry.  Nursing note and vitals reviewed.   ED Course  Procedures (including critical care time) Labs Review Labs Reviewed  TROPONIN I    Imaging Review Dg Chest 2 View  09/24/2015  CLINICAL DATA:  Chest wall and sternal pain. EXAM: CHEST  2 VIEW COMPARISON:  09/22/2015. FINDINGS: Normal sized heart. Clear lungs with normal vascularity. The lungs remain mildly hyperexpanded. Stable right subclavian pacemaker leads. Mild thoracic spine degenerative changes. IMPRESSION: No acute abnormality.  Stable mild changes of COPD. Electronically Signed   By: Claudie Revering M.D.   On: 09/24/2015 10:50   I have personally reviewed and evaluated these images and lab results as part of my medical decision-making.   EKG Interpretation   Date/Time:  Sunday September 24 2015 21:22:27 EDT Ventricular Rate:  97 PR Interval:    QRS Duration: 97 QT Interval:  281 QTC Calculation: 357 R Axis:   -160 Text Interpretation:  Atrial fibrillation Ventricular premature complex  Right axis deviation Consider anterior infarct Nonspecific T  abnormalities, lateral leads T waves less peaked compared to previous  Confirmed by LITTLE  MD, RACHEL [id number]) on 09/25/2015 12:35:25 AM      MDM   Final diagnoses:  None   [name] presents to ED for chest pain. Seen at [facility] this morning and left AMA. Patient seen multiple times in the ER for the same complaint. EKG reviewed and reassuring. Exam reassuring.   11:43 PM - Notified by nursing staff that patient is smoking in the bathroom. Security called. Troponin negative. Patient safe for discharge and to be escorted out of facility.     [facility] Yissel Habermehl, PA-C 09/25/15 [place], MD 09/25/15 [phone]

## 2015-09-24 NOTE — ED Notes (Signed)
Patient given a sprite as requested.

## 2015-09-24 NOTE — ED Provider Notes (Signed)
CSN: [id number]     Arrival date & time 09/24/15  [id number] History   First MD Initiated Contact with Patient 09/24/15 0945     Chief Complaint  Patient presents with  . Chest Pain     (Consider location/radiation/quality/duration/timing/severity/associated sxs/prior Treatment) HPI Patient presents by EMS for ongoing left-sided chest pain which he states is chronic and progressive cough with sputum production. He's been seen daily and sometimes multiple times daily for the last week. He's been seen 27 times since the beginning of the year. Denies any fever or chills. States he has ongoing shortness of breath and some wheezing. Has yet to fill the prescription and so they've been given him in the emergency department. States he will do that tomorrow. No new lower extremity swelling or pain. Past Medical History  Diagnosis Date  . Depression   . Fall at nursing home     December 2014  . History of migraine   . Gout attack   . Complete heart block ([place])     St. Jude pacemaker - followed by Dr. Lovena Le  . Chronic atrial fibrillation ([place])   . HLD (hyperlipidemia)   . GIB (gastrointestinal bleeding)   . BPH (benign prostatic hyperplasia)   . Essential hypertension   . Gastric ulcer   . Non Hodgkin's lymphoma ([place])   . Tubular adenoma of colon   . Aspiration pneumonia ([place]) 10/07/2014  . 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 Atypical angina ([place]) 12/01/2014  . BLOCK, AV, COMPLETE 12/24/2006    Annotation: Pacer placed Qualifier: Diagnosis of  By: Hilma Favors  DO, Beth    . 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 Fracture femur, cervicotrochanteric ([place]) 05/27/2013   Past Surgical History  Procedure Laterality Date  . Pacemaker generator change      Dr. Lovena Le  . Appendectomy    . Cholecystectomy    . Open reduction of hip Right 05/28/2013   Procedure: OPEN REDUCTION OF HIP;  Surgeon: Renette Butters, MD;  Location: [facility];  Service: Orthopedics;  Laterality: Right;  . Esophagogastroduodenoscopy (egd) with propofol N/A 09/26/2014    Procedure: ESOPHAGOGASTRODUODENOSCOPY (EGD) WITH PROPOFOL;  Surgeon: Jerene Bears, MD;  Location: [facility] ENDOSCOPY;  Service: Endoscopy;  Laterality: N/A;  . Colonoscopy N/A 10/01/2014    Procedure: COLONOSCOPY;  Surgeon: Jerene Bears, MD;  Location: [facility] ENDOSCOPY;  Service: Endoscopy;  Laterality: N/A;  . Partial colectomy N/A 10/05/2014    Procedure: SUBTOTAL COLECTOMY WITH ILEORECTAL ANASTOMOSIS;  Surgeon: Donnie Mesa, MD;  Location: [facility];  Service: General;  Laterality: N/A;  . Flexible sigmoidoscopy N/A 11/07/2014    Procedure: FLEXIBLE SIGMOIDOSCOPY;  Surgeon: Jerene Bears, MD;  Location: [facility] ENDOSCOPY;  Service: Endoscopy;  Laterality: N/A;  . Gastric resection    . Flexible sigmoidoscopy N/A 07/30/2015    Procedure: FLEXIBLE SIGMOIDOSCOPY;  Surgeon: Doran Stabler, MD;  Location: [facility] ENDOSCOPY;  Service: Endoscopy;  Laterality: N/A;  . Transanal excision of rectal mass N/A 08/03/2015    Procedure: TRANSANAL MINIMALLY INVASIVE SURGERY TO RESECT RECTAL POLYP;  Surgeon: [name], MD;  Location: [facility] ORS;  Service: General;  Laterality: N/A;  . Flexible sigmoidoscopy N/A 08/03/2015    Procedure: FLEXIBLE SIGMOIDOSCOPY;  Surgeon: [name], MD;  Location: [facility] ORS;  Service: General;  Laterality: N/A;   Family  History  Problem Relation Age of Onset  . Colon cancer Mother    Social History  Substance Use Topics  . Smoking status: Current Every Day Smoker -- 0.50 packs/day for 66 years    Types: Cigarettes  . Smokeless tobacco: Former Systems developer    Types: Snuff, Chew     Comment: 05/27/2013 "haven't used chew or snuff in ~ 30 yr"  . Alcohol Use: 0.0 oz/week    0 Standard drinks or equivalent per week     Comment: "I drink a fifth of bourbon a month" Hadn't had anything in 2 months    Review of  Systems  Constitutional: Negative for fever and chills.  HENT: Negative for congestion and sore throat.   Respiratory: Positive for cough, shortness of breath and wheezing.   Cardiovascular: Positive for chest pain. Negative for palpitations and leg swelling.  Gastrointestinal: Negative for nausea, vomiting, abdominal pain and diarrhea.  Musculoskeletal: Negative for myalgias, back pain, neck pain and neck stiffness.  Skin: Negative for rash and wound.  Neurological: Negative for dizziness, light-headedness, numbness and headaches.  All other systems reviewed and are negative.     Allergies  Review of patient's allergies indicates no known allergies.  Home Medications   Prior to Admission medications   Medication Sig Start Date End Date Taking? Authorizing Provider  albuterol (PROVENTIL HFA;VENTOLIN HFA) 108 (90 Base) MCG/ACT inhaler Inhale 2 puffs into the lungs every 4 (four) hours as needed for wheezing or shortness of breath. 08/07/15  Yes Christina P Rama, MD  feeding supplement, ENSURE ENLIVE, (ENSURE ENLIVE) LIQD Take 237 mLs by mouth 2 (two) times daily between meals. Patient not taking: Reported on 09/25/2015 08/07/15  Yes Christina P Rama, MD  oxyCODONE (OXY IR/ROXICODONE) 5 MG immediate release tablet Take 1 tablet (5 mg total) by mouth every 6 (six) hours as needed for severe pain. Patient not taking: Reported on 09/25/2015 08/07/15  Yes Venetia Maxon Rama, MD  carvedilol (COREG) 6.25 MG tablet Take 1 tablet (6.25 mg total) by mouth 2 (two) times daily with a meal. Patient not taking: Reported on 09/24/2015 09/23/15   Blanchie Dessert, MD  diphenoxylate-atropine (LOMOTIL) 2.5-0.025 MG tablet Take 1 tablet by mouth 4 (four) times daily as needed for diarrhea or loose stools. 09/25/15   Tanna Furry, MD  predniSONE (DELTASONE) 20 MG tablet Take 2 tablets (40 mg total) by mouth daily. Patient not taking: Reported on 09/24/2015 09/23/15   Blanchie Dessert, MD   BP 122/54 mmHg  Pulse 100   Temp(Src) 98.1 F (36.7 C) (Oral)  Resp 18  SpO2 100% Physical Exam  Constitutional: He is oriented to person, place, and time. He appears well-developed and well-nourished. No distress.  HENT:  Head: Normocephalic.  Mouth/Throat: Oropharynx is clear and moist.  Healing abrasions to the right forehead  Eyes: EOM are normal. Pupils are equal, round, and reactive to light.  Neck: Normal range of motion. Neck supple. No JVD present.  Cardiovascular: Normal rate.  Exam reveals no gallop and no friction rub.   No murmur heard. Irregularly irregular rhythm  Pulmonary/Chest: Effort normal. No respiratory distress. He has wheezes. He has no rales. He exhibits tenderness.  Scattered expiratory wheezes. Patient is in no respiratory distress. Left-sided chest tenderness with palpation completely reproduces patient's chronic pain. No evidence of trauma. No crepitance or deformity.  Abdominal: Soft. Bowel sounds are normal. He exhibits no distension and no mass. There is no tenderness. There is no rebound and no guarding.  Musculoskeletal: Normal  range of motion. He exhibits no edema or tenderness.  No lower extremity asymmetry, tenderness or swelling.  Neurological: He is alert and oriented to person, place, and time.  Moves all extremities without deficit. Sensation is fully intact.  Skin: Skin is warm and dry. No rash noted. No erythema.  Psychiatric: He has a normal mood and affect. His behavior is normal.  Nursing note and vitals reviewed.   ED Course  Procedures (including critical care time) Labs Review Labs Reviewed - No data to display  Imaging Review No results found. I have personally reviewed and evaluated these images and lab results as part of my medical decision-making.   EKG Interpretation   Date/Time:  Sunday September 24 2015 10:12:34 EDT Ventricular Rate:  104 PR Interval:    QRS Duration: 96 QT Interval:  352 QTC Calculation: 463 R Axis:   -80 Text Interpretation:   Atrial fibrillation Ventricular premature complex  Markedly posterior QRS axis Borderline T wave abnormalities no significant  change from prior EKG Confirmed by Lita Mains  MD, Kasyn Rolph ([id number]) on  09/25/2015 8:45:44 PM      MDM   Final diagnoses:  Chest wall pain    We'll screen for cardiac disease with EKG.  EKG without any acute changes. Patient is well-appearing. Chest pain is reproducible with palpation.  Patient left AMA prior to discharge instructions and return precautions.    Julianne Rice, MD 09/26/15 1556

## 2015-09-24 NOTE — ED Notes (Signed)
Pt found to be smoking in the restroom, security called. MD aware and pt will be escorted off of property. Charge RN also aware.

## 2015-09-24 NOTE — ED Notes (Signed)
Bed: [id number] Expected date: 09/24/15 Expected time: 9:41 AM Means of arrival:  Comments: Chest pain, seen [facility]

## 2015-09-24 NOTE — Discharge Instructions (Signed)
Chronic Pain  Chronic pain can be defined as pain that is off and on and lasts for 3-6 months or longer. Many things cause chronic pain, which can make it difficult to make a diagnosis. There are many treatment options available for chronic pain. However, finding a treatment that works well for you may require trying various approaches until the right one is found. Many people benefit from a combination of two or more types of treatment to control their pain.  SYMPTOMS   Chronic pain can occur anywhere in the body and can range from mild to very severe. Some types of chronic pain include:  · Headache.  · Low back pain.  · Cancer pain.  · Arthritis pain.  · Neurogenic pain. This is pain resulting from damage to nerves.   People with chronic pain may also have other symptoms such as:  · Depression.  · Anger.  · Insomnia.  · Anxiety.  DIAGNOSIS   Your health care provider will help diagnose your condition over time. In many cases, the initial focus will be on excluding possible conditions that could be causing the pain. Depending on your symptoms, your health care provider may order tests to diagnose your condition. Some of these tests may include:   · Blood tests.    · CT scan.    · MRI.    · X-rays.    · Ultrasounds.    · Nerve conduction studies.    You may need to see a specialist.   TREATMENT   Finding treatment that works well may take time. You may be referred to a pain specialist. He or she may prescribe medicine or therapies, such as:   · Mindful meditation or yoga.  · Shots (injections) of numbing or pain-relieving medicines into the spine or area of pain.  · Local electrical stimulation.  · Acupuncture.    · Massage therapy.    · Aroma, color, light, or sound therapy.    · Biofeedback.    · Working with a physical therapist to keep from getting stiff.    · Regular, gentle exercise.    · Cognitive or behavioral therapy.    · Group support.    Sometimes, surgery may be recommended.   HOME CARE INSTRUCTIONS    · Take all medicines as directed by your health care provider.    · Lessen stress in your life by relaxing and doing things such as listening to calming music.    · Exercise or be active as directed by your health care provider.    · Eat a healthy diet and include things such as vegetables, fruits, fish, and lean meats in your diet.    · Keep all follow-up appointments with your health care provider.    · Attend a support group with others suffering from chronic pain.  SEEK MEDICAL CARE IF:   · Your pain gets worse.    · You develop a new pain that was not there before.    · You cannot tolerate medicines given to you by your health care provider.    · You have new symptoms since your last visit with your health care provider.    SEEK IMMEDIATE MEDICAL CARE IF:   · You feel weak.    · You have decreased sensation or numbness.    · You lose control of bowel or bladder function.    · Your pain suddenly gets much worse.    · You develop shaking.  · You develop chills.  · You develop confusion.  · You develop chest pain.  · You develop shortness of breath.    MAKE SURE YOU:  ·   Document Revised: 01/27/2013 Document Reviewed: 11/20/2012 Elsevier Interactive Patient Education 2016 Elsevier Inc[employer]  Nonspecific Chest Pain  Chest pain can be caused by many different conditions. There is always a chance that your pain could be related to something serious, such as a heart attack or a blood clot in your lungs. Chest pain can also be caused by conditions that are not life-threatening. If you have chest pain, it is very important to follow up with your health care provider. CAUSES  Chest pain  can be caused by:  Heartburn.  Pneumonia or bronchitis.  Anxiety or stress.  Inflammation around your heart (pericarditis) or lung (pleuritis or pleurisy).  A blood clot in your lung.  A collapsed lung (pneumothorax). It can develop suddenly on its own (spontaneous pneumothorax) or from trauma to the chest.  Shingles infection (varicella-zoster virus).  Heart attack.  Damage to the bones, muscles, and cartilage that make up your chest wall. This can include:  Bruised bones due to injury.  Strained muscles or cartilage due to frequent or repeated coughing or overwork.  Fracture to one or more ribs.  Sore cartilage due to inflammation (costochondritis). RISK FACTORS  Risk factors for chest pain may include:  Activities that increase your risk for trauma or injury to your chest.  Respiratory infections or conditions that cause frequent coughing.  Medical conditions or overeating that can cause heartburn.  Heart disease or family history of heart disease.  Conditions or health behaviors that increase your risk of developing a blood clot.  Having had chicken pox (varicella zoster). SIGNS AND SYMPTOMS Chest pain can feel like:  Burning or tingling on the surface of your chest or deep in your chest.  Crushing, pressure, aching, or squeezing pain.  Dull or sharp pain that is worse when you move, cough, or take a deep breath.  Pain that is also felt in your back, neck, shoulder, or arm, or pain that spreads to any of these areas. Your chest pain may come and go, or it may stay constant. DIAGNOSIS Lab tests or other studies may be needed to find the cause of your pain. Your health care provider may have you take a test called an ambulatory ECG (electrocardiogram). An ECG records your heartbeat patterns at the time the test is performed. You may also have other tests, such as:  Transthoracic echocardiogram (TTE). During echocardiography, sound waves are used to create a  picture of all of the heart structures and to look at how blood flows through your heart.  Transesophageal echocardiogram (TEE).This is a more advanced imaging test that obtains images from inside your body. It allows your health care provider to see your heart in finer detail.  Cardiac monitoring. This allows your health care provider to monitor your heart rate and rhythm in real time.  Holter monitor. This is a portable device that records your heartbeat and can help to diagnose abnormal heartbeats. It allows your health care provider to track your heart activity for several days, if needed.  Stress tests. These can be done through exercise or by taking medicine that makes your heart beat more quickly.  Blood tests.  Imaging tests. TREATMENT  Your treatment depends on what is causing your chest pain. Treatment may include:  Medicines. These may include:  Acid blockers for heartburn.  Anti-inflammatory medicine.  Pain medicine for inflammatory conditions.  Antibiotic medicine, if an infection is present.  Medicines to dissolve blood clots.  Medicines to treat coronary artery disease.  Supportive care  for conditions that do not require medicines. This may include:  Resting.  Applying heat or cold packs to injured areas.  Limiting activities until pain decreases. HOME CARE INSTRUCTIONS  If you were prescribed an antibiotic medicine, finish it all even if you start to feel better.  Avoid any activities that bring on chest pain.  Do not use any tobacco products, including cigarettes, chewing tobacco, or electronic cigarettes. If you need help quitting, ask your health care provider.  Do not drink alcohol.  Take medicines only as directed by your health care provider.  Keep all follow-up visits as directed by your health care provider. This is important. This includes any further testing if your chest pain does not go away.  If heartburn is the cause for your chest  pain, you may be told to keep your head raised (elevated) while sleeping. This reduces the chance that acid will go from your stomach into your esophagus.  Make lifestyle changes as directed by your health care provider. These may include:  Getting regular exercise. Ask your health care provider to suggest some activities that are safe for you.  Eating a heart-healthy diet. A registered dietitian can help you to learn healthy eating options.  Maintaining a healthy weight.  Managing diabetes, if necessary.  Reducing stress. SEEK MEDICAL CARE IF:  Your chest pain does not go away after treatment.  You have a rash with blisters on your chest.  You have a fever. SEEK IMMEDIATE MEDICAL CARE IF:   Your chest pain is worse.  You have an increasing cough, or you cough up blood.  You have severe abdominal pain.  You have severe weakness.  You faint.  You have chills.  You have sudden, unexplained chest discomfort.  You have sudden, unexplained discomfort in your arms, back, neck, or jaw.  You have shortness of breath at any time.  You suddenly start to sweat, or your skin gets clammy.  You feel nauseous or you vomit.  You suddenly feel light-headed or dizzy.  Your heart begins to beat quickly, or it feels like it is skipping beats. These symptoms may represent a serious problem that is an emergency. Do not wait to see if the symptoms will go away. Get medical help right away. Call your local emergency services (911 in the [place] Do not drive yourself to the hospital.   This information is not intended to replace advice given to you by your health care provider. Make sure you discuss any questions you have with your health care provider.   Document Released: 03/06/2005 Document Revised: 06/17/2014 Document Reviewed: 12/31/2013 Elsevier Interactive Patient Education [employer].

## 2015-09-24 NOTE — ED Notes (Signed)
Patient reports 2pm this afternoon, had chest pain 10/10, mild pain. No nausea, non radiating. Has defibrillator, hx of afib, pulse 100s. Aspirin and 1 nitro given, pain decreased from 10 to 7. Systolic bp changed from [id number] to 103 after nitro. 18g in right forearm. Was recently seen at [facility] for same.

## 2015-09-24 NOTE — ED Notes (Signed)
PA at bedside.

## 2015-09-25 ENCOUNTER — Emergency Department (HOSPITAL_COMMUNITY)
Admission: EM | Admit: 2015-09-25 | Discharge: 2015-09-25 | Disposition: A | Discharge status: Home / Self Care | Payer: Medicare Other | Attending: Emergency Medicine | Admitting: Emergency Medicine

## 2015-09-25 ENCOUNTER — Encounter (HOSPITAL_COMMUNITY): Payer: Self-pay | Admitting: *Deleted

## 2015-09-25 ENCOUNTER — Encounter (HOSPITAL_COMMUNITY): Payer: Self-pay | Admitting: Emergency Medicine

## 2015-09-25 DIAGNOSIS — Z8701 Personal history of pneumonia (recurrent): Secondary | ICD-10-CM | POA: Insufficient documentation

## 2015-09-25 DIAGNOSIS — G8929 Other chronic pain: Secondary | ICD-10-CM | POA: Insufficient documentation

## 2015-09-25 DIAGNOSIS — I208 Other forms of angina pectoris: Secondary | ICD-10-CM | POA: Insufficient documentation

## 2015-09-25 DIAGNOSIS — Z8639 Personal history of other endocrine, nutritional and metabolic disease: Secondary | ICD-10-CM | POA: Insufficient documentation

## 2015-09-25 DIAGNOSIS — Z87438 Personal history of other diseases of male genital organs: Secondary | ICD-10-CM | POA: Diagnosis not present

## 2015-09-25 DIAGNOSIS — F17210 Nicotine dependence, cigarettes, uncomplicated: Secondary | ICD-10-CM | POA: Diagnosis not present

## 2015-09-25 DIAGNOSIS — R079 Chest pain, unspecified: Secondary | ICD-10-CM | POA: Insufficient documentation

## 2015-09-25 DIAGNOSIS — Z8781 Personal history of (healed) traumatic fracture: Secondary | ICD-10-CM | POA: Diagnosis not present

## 2015-09-25 DIAGNOSIS — R197 Diarrhea, unspecified: Secondary | ICD-10-CM | POA: Diagnosis present

## 2015-09-25 DIAGNOSIS — Z85038 Personal history of other malignant neoplasm of large intestine: Secondary | ICD-10-CM | POA: Insufficient documentation

## 2015-09-25 DIAGNOSIS — Z9049 Acquired absence of other specified parts of digestive tract: Secondary | ICD-10-CM | POA: Insufficient documentation

## 2015-09-25 DIAGNOSIS — Z8659 Personal history of other mental and behavioral disorders: Secondary | ICD-10-CM | POA: Diagnosis not present

## 2015-09-25 DIAGNOSIS — I10 Essential (primary) hypertension: Secondary | ICD-10-CM | POA: Diagnosis not present

## 2015-09-25 DIAGNOSIS — K529 Noninfective gastroenteritis and colitis, unspecified: Secondary | ICD-10-CM | POA: Diagnosis not present

## 2015-09-25 DIAGNOSIS — Z86010 Personal history of colonic polyps: Secondary | ICD-10-CM | POA: Insufficient documentation

## 2015-09-25 DIAGNOSIS — M25551 Pain in right hip: Secondary | ICD-10-CM | POA: Diagnosis not present

## 2015-09-25 DIAGNOSIS — C8590 Non-Hodgkin lymphoma, unspecified, unspecified site: Secondary | ICD-10-CM | POA: Diagnosis not present

## 2015-09-25 LAB — CBC WITH DIFFERENTIAL/PLATELET
BASOS ABS: 0 10*3/uL (ref 0.0–0.1)
BASOS PCT: 0 %
Eosinophils Absolute: 0.2 10*3/uL (ref 0.0–0.7)
Eosinophils Relative: 2 %
HEMATOCRIT: 32.2 % — AB (ref 39.0–52.0)
HEMOGLOBIN: 9.5 g/dL — AB (ref 13.0–17.0)
LYMPHS PCT: 24 %
Lymphs Abs: 2.9 10*3/uL (ref 0.7–4.0)
MCH: 27.6 pg (ref 26.0–34.0)
MCHC: 29.5 g/dL — AB (ref 30.0–36.0)
MCV: 93.6 fL (ref 78.0–100.0)
MONO ABS: 0.9 10*3/uL (ref 0.1–1.0)
MONOS PCT: 7 %
NEUTROS ABS: 8.2 10*3/uL — AB (ref 1.7–7.7)
NEUTROS PCT: 67 %
Platelets: 271 10*3/uL (ref 150–400)
RBC: 3.44 MIL/uL — ABNORMAL LOW (ref 4.22–5.81)
RDW: 17.9 % — AB (ref 11.5–15.5)
WBC: 12.2 10*3/uL — ABNORMAL HIGH (ref 4.0–10.5)

## 2015-09-25 MED ORDER — DIPHENOXYLATE-ATROPINE 2.5-0.025 MG PO TABS
2.0000 | ORAL_TABLET | Freq: Once | ORAL | Status: AC
  Administered 2015-09-25: 2 via ORAL
  Filled 2015-09-25: qty 2

## 2015-09-25 MED ORDER — DIPHENOXYLATE-ATROPINE 2.5-0.025 MG PO TABS: 1.0000 | ORAL_TABLET | Freq: Four times a day (QID) | ORAL | Status: AC | PRN

## 2015-09-25 NOTE — ED Notes (Signed)
Pt stood up from chair, walked around and IV bag dropped to floor.  Previous IV fluid had been 250 - now 300 ns.  Dr Jeneen Rinks notified.

## 2015-09-25 NOTE — ED Notes (Signed)
Pt escorted off premises after smoking in the bathroom. Departed in NAD.

## 2015-09-25 NOTE — ED Provider Notes (Signed)
CSN: [id number]     Arrival date & time 09/25/15  0848 History   First MD Initiated Contact with Patient 09/25/15 0900     Chief Complaint  Patient presents with  . Diarrhea  . Hip Pain      HPI  [name] via ambulance with complaint of chronic diarrhea. States he has diarrhea "every day all the time since they took my colon". He she'll with hemicolectomy for colon cancer 2 years ago describes diarrhea every day. No blood. No pain or fever/symptoms of infection.  Past Medical History  Diagnosis Date  . Depression   . Fall at nursing home     December 2014  . History of migraine   . Gout attack   . Complete heart block ([place])     St. Jude pacemaker - followed by Dr. Lovena Le  . Chronic atrial fibrillation ([place])   . HLD (hyperlipidemia)   . GIB (gastrointestinal bleeding)   . BPH (benign prostatic hyperplasia)   . Essential hypertension   . Gastric ulcer   . Non Hodgkin's lymphoma ([place])   . Tubular adenoma of colon   . Aspiration pneumonia ([place]) 10/07/2014  . 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 Atypical angina ([place]) 12/01/2014  . BLOCK, AV, COMPLETE 12/24/2006    Annotation: Pacer placed Qualifier: Diagnosis of  By: Hilma Favors  DO, Beth    . 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 Fracture femur, cervicotrochanteric ([place]) 05/27/2013   Past Surgical History  Procedure Laterality Date  . Pacemaker generator change      Dr. Lovena Le  . Appendectomy    . Cholecystectomy    . Open reduction of hip Right 05/28/2013    Procedure: OPEN REDUCTION OF HIP;  Surgeon: Renette Butters, MD;  Location: [place];  Service: Orthopedics;  Laterality: Right;  . Esophagogastroduodenoscopy (egd) with propofol N/A 09/26/2014    Procedure: ESOPHAGOGASTRODUODENOSCOPY (EGD) WITH PROPOFOL;  Surgeon: Jerene Bears, MD;  Location: [facility] ENDOSCOPY;  Service:  Endoscopy;  Laterality: N/A;  . Colonoscopy N/A 10/01/2014    Procedure: COLONOSCOPY;  Surgeon: Jerene Bears, MD;  Location: [facility] ENDOSCOPY;  Service: Endoscopy;  Laterality: N/A;  . Partial colectomy N/A 10/05/2014    Procedure: SUBTOTAL COLECTOMY WITH ILEORECTAL ANASTOMOSIS;  Surgeon: Donnie Mesa, MD;  Location: [place];  Service: General;  Laterality: N/A;  . Flexible sigmoidoscopy N/A 11/07/2014    Procedure: FLEXIBLE SIGMOIDOSCOPY;  Surgeon: Jerene Bears, MD;  Location[facility] ENDOSCOPY;  Service: Endoscopy;  Laterality: N/A;  . Gastric resection    . Flexible sigmoidoscopy N/A 07/30/2015    Procedure: FLEXIBLE SIGMOIDOSCOPY;  Surgeon: Doran Stabler, MD;  Location: [facility] ENDOSCOPY;  Service: Endoscopy;  Laterality: N/A;  . Transanal excision of rectal mass N/A 08/03/2015    Procedure: TRANSANAL MINIMALLY INVASIVE SURGERY TO RESECT RECTAL POLYP;  Surgeon: [name], MD;  Location: [facility] ORS;  Service: General;  Laterality: N/A;  . Flexible sigmoidoscopy N/A 08/03/2015    Procedure: FLEXIBLE SIGMOIDOSCOPY;  Surgeon: [name], MD;  Location: [facility] ORS;  Service: General;  Laterality: N/A;   Family History  Problem Relation Age of Onset  . Colon cancer Mother    Social History  Substance Use Topics  . Smoking status: Current Every Day Smoker -- 0.50 packs/day for 66 years    Types:  Cigarettes  . Smokeless tobacco: Former Systems developer    Types: Snuff, Chew     Comment: 05/27/2013 "haven't used chew or snuff in ~ 30 yr"  . Alcohol Use: 0.0 oz/week    0 Standard drinks or equivalent per week     Comment: "I drink a fifth of bourbon a month" Hadn't had anything in 2 months    Review of Systems  Constitutional: Negative for fever, chills, diaphoresis, appetite change and fatigue.  HENT: Negative for mouth sores, sore throat and trouble swallowing.   Eyes: Negative for visual disturbance.  Respiratory: Negative for cough, chest tightness, shortness of breath and wheezing.   Cardiovascular: Negative  for chest pain.  Gastrointestinal: Positive for diarrhea. Negative for nausea, vomiting, abdominal pain and abdominal distention.  Endocrine: Negative for polydipsia, polyphagia and polyuria.  Genitourinary: Negative for dysuria, frequency and hematuria.  Musculoskeletal: Negative for gait problem.  Skin: Negative for color change, pallor and rash.  Neurological: Negative for dizziness, syncope, light-headedness and headaches.  Hematological: Does not bruise/bleed easily.  Psychiatric/Behavioral: Negative for behavioral problems and confusion.      Allergies  Review of patient's allergies indicates no known allergies.  Home Medications   Prior to Admission medications   Medication Sig Start Date End Date Taking? Authorizing Provider  albuterol (PROVENTIL HFA;VENTOLIN HFA) 108 (90 Base) MCG/ACT inhaler Inhale 2 puffs into the lungs every 4 (four) hours as needed for wheezing or shortness of breath. 08/07/15  Yes Venetia Maxon Rama, MD  carvedilol (COREG) 6.25 MG tablet Take 1 tablet (6.25 mg total) by mouth 2 (two) times daily with a meal. Patient not taking: Reported on 09/24/2015 09/23/15   Blanchie Dessert, MD  diphenoxylate-atropine (LOMOTIL) 2.5-0.025 MG tablet Take 1 tablet by mouth 4 (four) times daily as needed for diarrhea or loose stools. 09/25/15   Tanna Furry, MD  feeding supplement, ENSURE ENLIVE, (ENSURE ENLIVE) LIQD Take 237 mLs by mouth 2 (two) times daily between meals. Patient not taking: Reported on 09/25/2015 08/07/15   Venetia Maxon Rama, MD  oxyCODONE (OXY IR/ROXICODONE) 5 MG immediate release tablet Take 1 tablet (5 mg total) by mouth every 6 (six) hours as needed for severe pain. Patient not taking: Reported on 09/25/2015 08/07/15   Venetia Maxon Rama, MD  predniSONE (DELTASONE) 20 MG tablet Take 2 tablets (40 mg total) by mouth daily. Patient not taking: Reported on 09/24/2015 09/23/15   Blanchie Dessert, MD   BP 131/88 mmHg  Pulse 48  Temp(Src) 97.6 F (36.4 C) (Oral)  Resp  18  Wt 90 lb (40.824 kg)  SpO2 93% Physical Exam  Constitutional: He is oriented to person, place, and time. He appears well-developed and well-nourished. No distress.  HENT:  Head: Normocephalic.  Eyes: Conjunctivae are normal. Pupils are equal, round, and reactive to light. No scleral icterus.  Neck: Normal range of motion. Neck supple. No thyromegaly present.  Cardiovascular: Normal rate and regular rhythm.  Exam reveals no gallop and no friction rub.   No murmur heard. Pulmonary/Chest: Effort normal and breath sounds normal. No respiratory distress. He has no wheezes. He has no rales.  Abdominal: Soft. Bowel sounds are normal. He exhibits no distension. There is no tenderness. There is no rebound.  Soft benign abdomen. Normal active bowel sounds. Brown stool is guaiac-negative at the bedside.  Musculoskeletal: Normal range of motion.  Complains of chronic right hip pain. He is ambulatory with normal range of motion.  Neurological: He is alert and oriented to person, place, and time.  Skin: Skin is warm and dry. No rash noted.  Psychiatric: He has a normal mood and affect. His behavior is normal.    ED Course  Procedures (including critical care time) Labs Review Labs Reviewed - No data to display  Imaging Review Dg Chest 2 View  09/24/2015  CLINICAL DATA:  Chest wall and sternal pain. EXAM: CHEST  2 VIEW COMPARISON:  09/22/2015. FINDINGS: Normal sized heart. Clear lungs with normal vascularity. The lungs remain mildly hyperexpanded. Stable right subclavian pacemaker leads. Mild thoracic spine degenerative changes. IMPRESSION: No acute abnormality.  Stable mild changes of COPD. Electronically Signed   By: Claudie Revering M.D.   On: 09/24/2015 10:50   I have personally reviewed and evaluated these images and lab results as part of my medical decision-making.   EKG Interpretation None      MDM   Final diagnoses:  Chronic diarrhea    Pleasant gentleman. Is in no distress. Is  not hypotensive or orthostatic. Guaiac at the bedside is negative. No recent antibiotic use or concern for C. difficile. After his colon rectum for colon cancer he's had chronic diarrhea. He has his Medicaid card with him. States he is able to get his prescriptions without problem. Given Lomotil here. Discharged to continue on Lomotil for his chronic diarrhea.    Tanna Furry, MD 09/25/15 [phone]

## 2015-09-25 NOTE — ED Notes (Signed)
Pt to ED via [facility] with c/o hip pain and diarrhea-- pt is incontinent of stool at triage--

## 2015-09-25 NOTE — Discharge Instructions (Signed)
Fill your prescription for Lomotil.  Take a dose morning, and evening to decrease your chronic diarrhea.  Chronic Diarrhea Diarrhea is frequent loose and watery bowel movements. It can cause you to feel weak and dehydrated. Dehydration can cause you to become tired and thirsty and to have a dry mouth, decreased urination, and dark yellow urine. Diarrhea is a sign of another problem, most often an infection that will not last long. In most cases, diarrhea lasts 2-3 days. Diarrhea that lasts longer than 4 weeks is called long-lasting (chronic) diarrhea. It is important to treat your diarrhea as directed by your health care provider to lessen or prevent future episodes of diarrhea.  CAUSES  There are many causes of chronic diarrhea. The following are some possible causes:   Gastrointestinal infections caused by viruses, bacteria, or parasites.   Food poisoning or food allergies.   Certain medicines, such as antibiotics, chemotherapy, and laxatives.   Artificial sweeteners and fructose.   Digestive disorders, such as celiac disease and inflammatory bowel diseases.   Irritable bowel syndrome.  Some disorders of the pancreas.  Disorders of the thyroid.  Reduced blood flow to the intestines.  Cancer. Sometimes the cause of chronic diarrhea is unknown. RISK FACTORS  Having a severely weakened immune system, such as from HIV or AIDS.   Taking certain types of cancer-fighting drugs (such as with chemotherapy) or other medicines.   Having had a recent organ transplant.   Having a portion of the stomach or small bowel removed.   Traveling to countries where food and water supplies are often contaminated.  SYMPTOMS  In addition to frequent, loose stools, diarrhea may cause:   Cramping.   Abdominal pain.   Nausea.   Fever.  Fatigue.  Urgent need to use the bathroom.  Loss of bowel control. DIAGNOSIS  Your health care provider must take a careful history and  perform a physical exam. Tests given are based on your symptoms and history. Tests may include:   Blood or stool tests. Three or more stool samples may be examined. Stool cultures may be used to test for bacteria or parasites.   X-rays.   A procedure in which a thin tube is inserted into the mouth or rectum (endoscopy). This allows the health care provider to look inside the intestine.  TREATMENT   Treatment is aimed at correcting the cause of the diarrhea when possible.  Diarrhea caused by an infection can often be treated with antibiotic medicines.  Diarrhea not caused by an infection may require you to take long-term medicine or have surgery. Specific treatment should be discussed with your health care provider.  If the cause cannot be determined, treatment aims to relieve symptoms and prevent dehydration. Serious health problems can occur if you do not maintain proper fluid levels. Treatment may include:  Taking an oral rehydration solution (ORS).  Not drinking beverages that contain caffeine (such as tea, coffee, and soft drinks).  Not drinking alcohol.  Maintaining well-balanced nutrition to help you recover faster. HOME CARE INSTRUCTIONS   Drink enough fluids to keep urine clear or pale yellow. Drink 1 cup (8 oz) of fluid for each diarrhea episode. Avoid fluids that contain simple sugars, fruit juices, whole milk products, and sodas. Hydrate with an ORS. You may purchase the ORS or prepare it at home by mixing the following ingredients together:   - tsp (1.7-3  mL) table salt.   tsp (3  mL) baking soda.   tsp (1.7 mL) salt substitute  containing potassium chloride.  1 tbsp (20 mL) sugar.  4.2 c (1 L) of water.   Certain foods and beverages may increase the speed at which food moves through the gastrointestinal (GI) tract. These foods and beverages should be avoided. They include:  Caffeinated and alcoholic beverages.  High-fiber foods, such as raw fruits and  vegetables, nuts, seeds, and whole grain breads and cereals.  Foods and beverages sweetened with sugar alcohols, such as xylitol, sorbitol, and mannitol.   Some foods may be well tolerated and may help thicken stool. These include:  Starchy foods, such as rice, toast, pasta, low-sugar cereal, oatmeal, grits, baked potatoes, crackers, and bagels.  Bananas.  Applesauce.  Add probiotic-rich foods to help increase healthy bacteria in the GI tract. These include yogurt and fermented milk products.  Wash your hands well after each diarrhea episode.  Only take over-the-counter or prescription medicines as directed by your health care provider.  Take a warm bath to relieve any burning or pain from frequent diarrhea episodes. SEEK MEDICAL CARE IF:   You are not urinating as often.  Your urine is a dark color.  You become very tired or dizzy.  You have severe pain in the abdomen or rectum.  Your have blood or pus in your stools.  Your stools look black and tarry. SEEK IMMEDIATE MEDICAL CARE IF:   You are unable to keep fluids down.  You have persistent vomiting.  You have blood in your stool.  Your stools are black and tarry.  You do not urinate in 6-8 hours, or there is only a small amount of very dark urine.  You have abdominal pain that increases or localizes.  You have weakness, dizziness, confusion, or lightheadedness.  You have a severe headache.  Your diarrhea gets worse or does not get better.  You have a fever or persistent symptoms for more than 2-3 days.  You have a fever and your symptoms suddenly get worse. MAKE SURE YOU:   Understand these instructions.  Will watch your condition.  Will get help right away if you are not doing well or get worse.   This information is not intended to replace advice given to you by your health care provider. Make sure you discuss any questions you have with your health care provider.   Document Released: 08/17/2003  Document Revised: 06/01/2013 Document Reviewed: 11/19/2012 Elsevier Interactive Patient Education [employer].

## 2015-09-25 NOTE — ED Notes (Addendum)
Pt was d/c'd from ED today after being tx for chest pain.  States same and that his "defibrilator went off".  Pt only appearsto have pace maker.  States pain 4/10.  Given 250 ns en-route by EMS after his pressures dropped to 90/45 from 1 sl nitro.  324 asa also given en-route.  FT PA aware of pt and states only to do ekg per care plan.

## 2015-09-26 ENCOUNTER — Emergency Department (HOSPITAL_COMMUNITY)
Admission: EM | Admit: 2015-09-26 | Discharge: 2015-09-26 | Disposition: A | Discharge status: Home / Self Care | Payer: Medicare Other | Attending: Emergency Medicine | Admitting: Emergency Medicine

## 2015-09-26 ENCOUNTER — Encounter (HOSPITAL_COMMUNITY): Payer: Self-pay | Admitting: Emergency Medicine

## 2015-09-26 DIAGNOSIS — Z8639 Personal history of other endocrine, nutritional and metabolic disease: Secondary | ICD-10-CM | POA: Diagnosis not present

## 2015-09-26 DIAGNOSIS — Y9289 Other specified places as the place of occurrence of the external cause: Secondary | ICD-10-CM | POA: Diagnosis not present

## 2015-09-26 DIAGNOSIS — I208 Other forms of angina pectoris: Secondary | ICD-10-CM | POA: Insufficient documentation

## 2015-09-26 DIAGNOSIS — I10 Essential (primary) hypertension: Secondary | ICD-10-CM | POA: Insufficient documentation

## 2015-09-26 DIAGNOSIS — F17210 Nicotine dependence, cigarettes, uncomplicated: Secondary | ICD-10-CM | POA: Diagnosis not present

## 2015-09-26 DIAGNOSIS — Z8739 Personal history of other diseases of the musculoskeletal system and connective tissue: Secondary | ICD-10-CM | POA: Diagnosis not present

## 2015-09-26 DIAGNOSIS — Z87438 Personal history of other diseases of male genital organs: Secondary | ICD-10-CM | POA: Diagnosis not present

## 2015-09-26 DIAGNOSIS — Z8572 Personal history of non-Hodgkin lymphomas: Secondary | ICD-10-CM | POA: Insufficient documentation

## 2015-09-26 DIAGNOSIS — S79911A Unspecified injury of right hip, initial encounter: Secondary | ICD-10-CM | POA: Insufficient documentation

## 2015-09-26 DIAGNOSIS — Z8701 Personal history of pneumonia (recurrent): Secondary | ICD-10-CM | POA: Diagnosis not present

## 2015-09-26 DIAGNOSIS — G8929 Other chronic pain: Secondary | ICD-10-CM | POA: Diagnosis not present

## 2015-09-26 DIAGNOSIS — Y9389 Activity, other specified: Secondary | ICD-10-CM | POA: Insufficient documentation

## 2015-09-26 DIAGNOSIS — Z8781 Personal history of (healed) traumatic fracture: Secondary | ICD-10-CM | POA: Insufficient documentation

## 2015-09-26 DIAGNOSIS — W1839XA Other fall on same level, initial encounter: Secondary | ICD-10-CM | POA: Diagnosis not present

## 2015-09-26 DIAGNOSIS — Z79899 Other long term (current) drug therapy: Secondary | ICD-10-CM | POA: Diagnosis not present

## 2015-09-26 DIAGNOSIS — Y998 Other external cause status: Secondary | ICD-10-CM | POA: Insufficient documentation

## 2015-09-26 DIAGNOSIS — M25551 Pain in right hip: Secondary | ICD-10-CM

## 2015-09-26 DIAGNOSIS — Z8719 Personal history of other diseases of the digestive system: Secondary | ICD-10-CM | POA: Insufficient documentation

## 2015-09-26 DIAGNOSIS — Z8659 Personal history of other mental and behavioral disorders: Secondary | ICD-10-CM | POA: Diagnosis not present

## 2015-09-26 DIAGNOSIS — Z86010 Personal history of colonic polyps: Secondary | ICD-10-CM | POA: Insufficient documentation

## 2015-09-26 MED ORDER — ACETAMINOPHEN 500 MG PO TABS
500.0000 mg | ORAL_TABLET | Freq: Once | ORAL | Status: AC
  Administered 2015-09-26: 500 mg via ORAL
  Filled 2015-09-26: qty 1

## 2015-09-26 NOTE — ED Notes (Signed)
GIven Happy Meal and Coke.

## 2015-09-26 NOTE — Discharge Instructions (Signed)
Please read and follow all provided instructions.  Your diagnoses today include:  1. Hip pain, right    Tests performed today include:  Vital signs. See below for your results today.   Medications prescribed:   None  Home care instructions:  Follow any educational materials contained in this packet.  Follow-up instructions: Please follow-up with your primary care provider in the next 48 hours for further evaluation of symptoms and treatment   Return instructions:   Please return to the Emergency Department if you do not get better, if you get worse, or new symptoms OR  - Fever (temperature greater than 101.69F)  - Bleeding that does not stop with holding pressure to the area    -Severe pain (please note that you may be more sore the day after your accident)  - Chest Pain  - Difficulty breathing  - Severe nausea or vomiting  - Inability to tolerate food and liquids  - Passing out  - Skin becoming red around your wounds  - Change in mental status (confusion or lethargy)  - New numbness or weakness     Please return if you have any other emergent concerns.  Additional Information:  Your vital signs today were: BP 128/92 mmHg   Pulse 78   Temp(Src) 98.2 F (36.8 C) (Oral)   Resp 18   SpO2 96% If your blood pressure (BP) was elevated above 135/85 this visit, please have this repeated by your doctor within one month. ---------------

## 2015-09-26 NOTE — ED Provider Notes (Signed)
History  By signing my name below, I, Marlowe Kays, attest that this documentation has been prepared under the direction and in the presence of Shary Decamp, PA-C. Electronically Signed: Marlowe Kays, ED Scribe. 09/26/2015. 9:29 AM.  Chief Complaint  Patient presents with  . Hip Pain   The history is provided by the patient and medical records. No language interpreter was used.    HPI Comments:  [name] is a 70 y.o. male with PMHx of chronic pain who presents to the Emergency Department complaining of severe left hip pain that began worsening about two weeks ago secondary to falling. He describes the pain as constant and sharp and rates it 10/10. He reports having surgery to the right hip within the past year. Pt states he does not believe the hip is fractured. He has not taken anything for pain. Moving the right hip increases the pain. He denies alleviating factors. He denies numbness, tingling or weakness of the RLE.   Past Medical History  Diagnosis Date  . Depression   . Fall at nursing home     December 2014  . History of migraine   . Gout attack   . Complete heart block ([place])     St. Jude pacemaker - followed by Dr. Lovena Le  . Chronic atrial fibrillation ([place])   . HLD (hyperlipidemia)   . GIB (gastrointestinal bleeding)   . BPH (benign prostatic hyperplasia)   . Essential hypertension   . Gastric ulcer   . Non Hodgkin's lymphoma ([place])   . Tubular adenoma of colon   . Aspiration pneumonia ([place]) 10/07/2014  . 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 Atypical angina ([place]) 12/01/2014  . BLOCK, AV, COMPLETE 12/24/2006    Annotation: Pacer placed Qualifier: Diagnosis of  By: Hilma Favors  DO, Beth    . 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 Fracture femur, cervicotrochanteric ([place]) 05/27/2013   Past Surgical History  Procedure  Laterality Date  . Pacemaker generator change      Dr. Lovena Le  . Appendectomy    . Cholecystectomy    . Open reduction of hip Right 05/28/2013    Procedure: OPEN REDUCTION OF HIP;  Surgeon: Renette Butters, MD;  Location: [place];  Service: Orthopedics;  Laterality: Right;  . Esophagogastroduodenoscopy (egd) with propofol N/A 09/26/2014    Procedure: ESOPHAGOGASTRODUODENOSCOPY (EGD) WITH PROPOFOL;  Surgeon: Jerene Bears, MD;  Location: [facility] ENDOSCOPY;  Service: Endoscopy;  Laterality: N/A;  . Colonoscopy N/A 10/01/2014    Procedure: COLONOSCOPY;  Surgeon: Jerene Bears, MD;  Location: [facility] ENDOSCOPY;  Service: Endoscopy;  Laterality: N/A;  . Partial colectomy N/A 10/05/2014    Procedure: SUBTOTAL COLECTOMY WITH ILEORECTAL ANASTOMOSIS;  Surgeon: Donnie Mesa, MD;  Location: [place];  Service: General;  Laterality: N/A;  . Flexible sigmoidoscopy N/A 11/07/2014    Procedure: FLEXIBLE SIGMOIDOSCOPY;  Surgeon: Jerene Bears, MD;  Location: [facility] ENDOSCOPY;  Service: Endoscopy;  Laterality: N/A;  . Gastric resection    . Flexible sigmoidoscopy N/A 07/30/2015    Procedure: FLEXIBLE SIGMOIDOSCOPY;  Surgeon: Doran Stabler, MD;  Location: [facility] ENDOSCOPY;  Service: Endoscopy;  Laterality: N/A;  . Transanal excision of rectal mass N/A 08/03/2015    Procedure: TRANSANAL MINIMALLY INVASIVE SURGERY TO RESECT RECTAL POLYP;  Surgeon: [name], MD;  Location: [facility]S;  Service: General;  Laterality: N/A;  . Flexible sigmoidoscopy N/A 08/03/2015    Procedure: FLEXIBLE SIGMOIDOSCOPY;  Surgeon: [name], MD;  Location: [facility] OR[facility];  Service: General;  Laterality: N/A;   Family History  Problem Relation Age of Onset  . Colon cancer Mother    Social History  Substance Use Topics  . Smoking status: Current Every Day Smoker -- 0.50 packs/day for 66 years    Types: Cigarettes  . Smokeless tobacco: Former Systems developer    Types: Snuff, Chew     Comment: 05/27/2013 "haven't used chew or snuff in ~ 30 yr"  . Alcohol Use: 0.0  oz/week    0 Standard drinks or equivalent per week     Comment: "I drink a fifth of bourbon a month" Hadn't had anything in 2 months    Review of Systems A complete 10 system review of systems was obtained and all systems are negative except as noted in the HPI and PMH.   Allergies  Review of patient's allergies indicates no known allergies.  Home Medications   Prior to Admission medications   Medication Sig Start Date End Date Taking? Authorizing Provider  albuterol (PROVENTIL HFA;VENTOLIN HFA) 108 (90 Base) MCG/ACT inhaler Inhale 2 puffs into the lungs every 4 (four) hours as needed for wheezing or shortness of breath. 08/07/15   Venetia Maxon Rama, MD  carvedilol (COREG) 6.25 MG tablet Take 1 tablet (6.25 mg total) by mouth 2 (two) times daily with a meal. Patient not taking: Reported on 09/24/2015 09/23/15   Blanchie Dessert, MD  diphenoxylate-atropine (LOMOTIL) 2.5-0.025 MG tablet Take 1 tablet by mouth 4 (four) times daily as needed for diarrhea or loose stools. 09/25/15   Tanna Furry, MD  feeding supplement, ENSURE ENLIVE, (ENSURE ENLIVE) LIQD Take 237 mLs by mouth 2 (two) times daily between meals. Patient not taking: Reported on 09/25/2015 08/07/15   Venetia Maxon Rama, MD  oxyCODONE (OXY IR/ROXICODONE) 5 MG immediate release tablet Take 1 tablet (5 mg total) by mouth every 6 (six) hours as needed for severe pain. Patient not taking: Reported on 09/25/2015 08/07/15   Venetia Maxon Rama, MD  predniSONE (DELTASONE) 20 MG tablet Take 2 tablets (40 mg total) by mouth daily. Patient not taking: Reported on 09/24/2015 09/23/15   Blanchie Dessert, MD   Triage Vitals: BP 128/92 mmHg  Pulse 78  Temp(Src) 98.2 F (36.8 C) (Oral)  Resp 18  SpO2 96%  Physical Exam  Constitutional: He is oriented to person, place, and time. He appears well-developed and well-nourished.  HENT:  Head: Normocephalic and atraumatic.  Eyes: EOM are normal.  Neck: Normal range of motion.  Cardiovascular: Normal rate and  regular rhythm.   Pulmonary/Chest: Effort normal.  Abdominal: Soft.  Musculoskeletal: Normal range of motion.  Full ROM of right hip. No crepitus appreciated. Distal pulses intact. Cap refill intact. No visible deformities on exam. Non TTP on exam   Neurological: He is alert and oriented to person, place, and time.  Skin: Skin is warm and dry.  Psychiatric: He has a normal mood and affect. His behavior is normal. Thought content normal.  Nursing note and vitals reviewed.  ED Course  Procedures (including critical care time) DIAGNOSTIC STUDIES: Oxygen Saturation is 96% on RA, adequate by my interpretation.   COORDINATION OF CARE: 9:29 AM- Will order pain medication. Pt verbalizes understanding and agrees to plan.  Medications - No data to display  Labs Review Labs Reviewed - No data to display  Imaging Review Dg Chest  2 View  09/24/2015  CLINICAL DATA:  Chest wall and sternal pain. EXAM: CHEST  2 VIEW COMPARISON:  09/22/2015. FINDINGS: Normal sized heart. Clear lungs with normal vascularity. The lungs remain mildly hyperexpanded. Stable right subclavian pacemaker leads. Mild thoracic spine degenerative changes. IMPRESSION: No acute abnormality.  Stable mild changes of COPD. Electronically Signed   By: Claudie Revering M.D.   On: 09/24/2015 10:50   I have personally reviewed and evaluated these images and lab results as part of my medical decision-making.   EKG Interpretation None      MDM  I have reviewed and evaluated the relevant laboratory values. I have reviewed and evaluated the relevant imaging studies. I have reviewed the relevant previous healthcare records. I obtained HPI from historian.  ED Course:  Assessment: Pt is a 69yM who presents with chronic right hip pain x several years. On exam, pt in NAD. Nontoxic/nonseptic appearing. VSS. Afebrile. Lungs CTA. Heart RRR. Abdomen nontender soft. Full ROM right hip. Distal pulses intact. No crepitus appreciated. As per care plan,  dced patient with follow up to PCP. No new symptoms. No new imaging required. Plan is to [place]. At time of discharge, Patient is in no acute distress. Vital Signs are stable. Patient is able to ambulate. Patient able to tolerate PO.    Disposition/Plan:  DC Home Additional Verbal discharge instructions given and discussed with patient.  Return precautions given Pt acknowledges and agrees with plan  Supervising Physician Deno Etienne, DO   Final diagnoses:  Hip pain, right    I personally performed the services described in this documentation, which was scribed in my presence. The recorded information has been reviewed and is accurate.   Shary Decamp, PA-C 09/26/15 [place], DO 09/26/15 [phone]

## 2015-09-26 NOTE — ED Notes (Signed)
[place] and leg pain x 2 days; pain is chronic in nature

## 2015-09-30 ENCOUNTER — Emergency Department (HOSPITAL_COMMUNITY)
Admission: EM | Admit: 2015-09-30 | Discharge: 2015-09-30 | Disposition: A | Discharge status: Home / Self Care | Payer: Medicare Other | Attending: Emergency Medicine | Admitting: Emergency Medicine

## 2015-09-30 ENCOUNTER — Encounter (HOSPITAL_COMMUNITY): Payer: Self-pay | Admitting: Emergency Medicine

## 2015-09-30 DIAGNOSIS — I208 Other forms of angina pectoris: Secondary | ICD-10-CM | POA: Insufficient documentation

## 2015-09-30 DIAGNOSIS — W19XXXA Unspecified fall, initial encounter: Secondary | ICD-10-CM

## 2015-09-30 DIAGNOSIS — Z87438 Personal history of other diseases of male genital organs: Secondary | ICD-10-CM | POA: Diagnosis not present

## 2015-09-30 DIAGNOSIS — Z79899 Other long term (current) drug therapy: Secondary | ICD-10-CM | POA: Insufficient documentation

## 2015-09-30 DIAGNOSIS — W01198A Fall on same level from slipping, tripping and stumbling with subsequent striking against other object, initial encounter: Secondary | ICD-10-CM | POA: Diagnosis not present

## 2015-09-30 DIAGNOSIS — Z8701 Personal history of pneumonia (recurrent): Secondary | ICD-10-CM | POA: Diagnosis not present

## 2015-09-30 DIAGNOSIS — I10 Essential (primary) hypertension: Secondary | ICD-10-CM | POA: Insufficient documentation

## 2015-09-30 DIAGNOSIS — F17210 Nicotine dependence, cigarettes, uncomplicated: Secondary | ICD-10-CM | POA: Diagnosis not present

## 2015-09-30 DIAGNOSIS — S0181XA Laceration without foreign body of other part of head, initial encounter: Secondary | ICD-10-CM | POA: Insufficient documentation

## 2015-09-30 DIAGNOSIS — Z8719 Personal history of other diseases of the digestive system: Secondary | ICD-10-CM | POA: Diagnosis not present

## 2015-09-30 DIAGNOSIS — Z23 Encounter for immunization: Secondary | ICD-10-CM | POA: Insufficient documentation

## 2015-09-30 DIAGNOSIS — Z86018 Personal history of other benign neoplasm: Secondary | ICD-10-CM | POA: Insufficient documentation

## 2015-09-30 DIAGNOSIS — Z8572 Personal history of non-Hodgkin lymphomas: Secondary | ICD-10-CM | POA: Diagnosis not present

## 2015-09-30 DIAGNOSIS — S0990XA Unspecified injury of head, initial encounter: Secondary | ICD-10-CM | POA: Diagnosis present

## 2015-09-30 DIAGNOSIS — Z8739 Personal history of other diseases of the musculoskeletal system and connective tissue: Secondary | ICD-10-CM | POA: Insufficient documentation

## 2015-09-30 DIAGNOSIS — Y998 Other external cause status: Secondary | ICD-10-CM | POA: Insufficient documentation

## 2015-09-30 DIAGNOSIS — Y9389 Activity, other specified: Secondary | ICD-10-CM | POA: Diagnosis not present

## 2015-09-30 DIAGNOSIS — Z8639 Personal history of other endocrine, nutritional and metabolic disease: Secondary | ICD-10-CM | POA: Diagnosis not present

## 2015-09-30 DIAGNOSIS — Y9289 Other specified places as the place of occurrence of the external cause: Secondary | ICD-10-CM | POA: Diagnosis not present

## 2015-09-30 MED ORDER — LIDOCAINE-EPINEPHRINE (PF) 2 %-1:200000 IJ SOLN
20.0000 mL | Freq: Once | INTRAMUSCULAR | Status: DC
  Filled 2015-09-30: qty 20

## 2015-09-30 MED ORDER — TETANUS-DIPHTH-ACELL PERTUSSIS 5-2.5-18.5 LF-MCG/0.5 IM SUSP
0.5000 mL | Freq: Once | INTRAMUSCULAR | Status: AC
  Administered 2015-09-30: 0.5 mL via INTRAMUSCULAR
  Filled 2015-09-30: qty 0.5

## 2015-09-30 MED ORDER — LIDOCAINE-EPINEPHRINE (PF) 2 %-1:200000 IJ SOLN
INTRAMUSCULAR | Status: AC
  Filled 2015-09-30: qty 20

## 2015-09-30 NOTE — ED Provider Notes (Signed)
CSN: [id number]     Arrival date & time 09/30/15  1944 History  By signing my name below, I, Helane Gunther, attest that this documentation has been prepared under the direction and in the presence of [employer], PA-C. Electronically Signed: Helane Gunther, ED Scribe. 09/30/2015. 9:02 PM.    Chief Complaint  Patient presents with  . Fall  . Head Laceration    forehead   The history is provided by the patient and the police. No language interpreter was used.   HPI Comments: [name] is a 70 y.o. male smoker at 0.5 ppd with a PMHx of essential HTN and chronic A-fib brought in by [facility] who presents to the Emergency Department complaining of a 4 cm, linear laceration to the right forehead, sustained after a fall that occurred just PTA. Per corrections officer pt slipped in water and fell, striking his forehead on the floor. He denies pt having LOC or vomiting after the fall. Pt reports associated pain to the area, as well as c/o dizziness. Per Curator, pt's tetanus vaccination status is unknown. He denies pt having any other injuries. Pt denies any other pains.  Past Medical History  Diagnosis Date  . Depression   . Fall at nursing home     December 2014  . History of migraine   . Gout attack   . Complete heart block ([place])     St. Jude pacemaker - followed by Dr. Lovena Le  . Chronic atrial fibrillation ([place])   . HLD (hyperlipidemia)   . GIB (gastrointestinal bleeding)   . BPH (benign prostatic hyperplasia)   . Essential hypertension   . Gastric ulcer   . Non Hodgkin's lymphoma ([place])   . Tubular adenoma of colon   . Aspiration pneumonia ([place]) 10/07/2014  . 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 Atypical angina ([place]) 12/01/2014  . BLOCK, AV, COMPLETE 12/24/2006    Annotation: Pacer placed Qualifier: Diagnosis of  By: Hilma Favors  DO, Beth    . 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 Fracture femur, cervicotrochanteric ([place]) 05/27/2013   Past Surgical History  Procedure Laterality Date  . Pacemaker generator change      Dr. Lovena Le  . Appendectomy    . Cholecystectomy    . Open reduction of hip Right 05/28/2013    Procedure: OPEN REDUCTION OF HIP;  Surgeon: Renette Butters, MD;  Location: [place];  Service: Orthopedics;  Laterality: Right;  . Esophagogastroduodenoscopy (egd) with propofol N/A 09/26/2014    Procedure: ESOPHAGOGASTRODUODENOSCOPY (EGD) WITH PROPOFOL;  Surgeon: Jerene Bears, MD;  Location: [facility] ENDOSCOPY;  Service: Endoscopy;  Laterality: N/A;  . Colonoscopy N/A 10/01/2014    Procedure: COLONOSCOPY;  Surgeon: Jerene Bears, MD;  Location: [facility] ENDOSCOPY;  Service: Endoscopy;  Laterality: N/A;  . Partial colectomy N/A 10/05/2014    Procedure: SUBTOTAL COLECTOMY WITH ILEORECTAL ANASTOMOSIS;  Surgeon: Donnie Mesa, MD;  Location: [place];  Service: General;  Laterality: N/A;  . Flexible sigmoidoscopy N/A 11/07/2014    Procedure: FLEXIBLE SIGMOIDOSCOPY;  Surgeon: Jerene Bears, MD;  Location: [facility] ENDOSCOPY;  Service: Endoscopy;  Laterality: N/A;  . Gastric resection    . Flexible sigmoidoscopy N/A 07/30/2015    Procedure: FLEXIBLE SIGMOIDOSCOPY;  Surgeon: Doran Stabler, MD;  [facility] ENDOSCOPY;  Service: Endoscopy;  Laterality:  N/A;  . Transanal excision of rectal mass N/A 08/03/2015    Procedure: TRANSANAL MINIMALLY INVASIVE SURGERY TO RESECT RECTAL POLYP;  Surgeon: [name], MD;  Location: [facility];  Service: General;  Laterality: N/A;  . Flexible sigmoidoscopy N/A 08/03/2015    Procedure: FLEXIBLE SIGMOIDOSCOPY;  Surgeon: [name], MD;  Location: [facility]S;  Service: General;  Laterality: N/A;   Family History  Problem Relation Age of Onset  . Colon cancer Mother    Social History  Substance Use Topics  . Smoking status: Current Every Day Smoker -- 0.50 packs/day for 66 years    Types: Cigarettes  .  Smokeless tobacco: Former Systems developer    Types: Snuff, Chew     Comment: 05/27/2013 "haven't used chew or snuff in ~ 30 yr"  . Alcohol Use: 0.0 oz/week    0 Standard drinks or equivalent per week     Comment: "I drink a fifth of bourbon a month" Hadn't had anything in 2 months    Review of Systems  Musculoskeletal: Negative for myalgias.  Skin: Positive for wound.  Neurological: Positive for dizziness and headaches.    Allergies  Review of patient's allergies indicates no known allergies.  Home Medications   Prior to Admission medications   Medication Sig Start Date End Date Taking? Authorizing Provider  albuterol (PROVENTIL HFA;VENTOLIN HFA) 108 (90 Base) MCG/ACT inhaler Inhale 2 puffs into the lungs every 4 (four) hours as needed for wheezing or shortness of breath. 08/07/15   Venetia Maxon Rama, MD  carvedilol (COREG) 6.25 MG tablet Take 1 tablet (6.25 mg total) by mouth 2 (two) times daily with a meal. Patient not taking: Reported on 09/24/2015 09/23/15   Blanchie Dessert, MD  diphenoxylate-atropine (LOMOTIL) 2.5-0.025 MG tablet Take 1 tablet by mouth 4 (four) times daily as needed for diarrhea or loose stools. 09/25/15   Tanna Furry, MD  feeding supplement, ENSURE ENLIVE, (ENSURE ENLIVE) LIQD Take 237 mLs by mouth 2 (two) times daily between meals. Patient not taking: Reported on 09/25/2015 08/07/15   Venetia Maxon Rama, MD  oxyCODONE (OXY IR/ROXICODONE) 5 MG immediate release tablet Take 1 tablet (5 mg total) by mouth every 6 (six) hours as needed for severe pain. Patient not taking: Reported on 09/25/2015 08/07/15   Venetia Maxon Rama, MD  predniSONE (DELTASONE) 20 MG tablet Take 2 tablets (40 mg total) by mouth daily. Patient not taking: Reported on 09/24/2015 09/23/15   Blanchie Dessert, MD   BP 106/59 mmHg  Pulse 80  Temp(Src) 98.2 F (36.8 C) (Oral)  Resp 16  SpO2 100% Physical Exam  Constitutional: He is oriented to person, place, and time. He appears well-developed and well-nourished.   HENT:  Head: Head is with laceration.  4 cm linear laceration to the right forehead, without hematoma, full thickness wound  Eyes: Conjunctivae and EOM are normal. Right eye exhibits no discharge. Left eye exhibits no discharge.  Neck: Normal range of motion.  Pulmonary/Chest: Effort normal. No respiratory distress.  Abdominal: He exhibits no distension.  Musculoskeletal: Normal range of motion. He exhibits no tenderness.  There is no midline cervical TTP  Neurological: He is alert and oriented to person, place, and time. Coordination normal.  Skin: Skin is warm and dry. No rash noted. He is not diaphoretic. No erythema.  Psychiatric: He has a normal mood and affect.  Nursing note and vitals reviewed.   ED Course  Procedures  DIAGNOSTIC STUDIES: Oxygen Saturation is 100% on RA, normal by my interpretation.  COORDINATION OF CARE: 8:20 PM - Discussed plans to numb, cleanse, and suture the wound. Will order a Tdap. Pt advised of plan for treatment and pt agrees.  LACERATION REPAIR PROCEDURE NOTE The patient's identification was confirmed and consent was obtained. This procedure was performed by Charlann Lange, PA-C  at 8:24 PM. Site: right forehead Sterile procedures observed Anesthetic used (type and amt): None Suture type/size: 6.0 Ethilon  Length: 4 cm # of Sutures: 8 Technique:simple interrupted Complexity: low  Antibx ointment applied Tetanus ordered Site anesthetized, irrigated with NS, explored without evidence of foreign body, wound well approximated, site covered with dry, sterile dressing.  Patient tolerated procedure well without complications. Instructions for care discussed verbally and patient provided with additional written instructions for homecare and f/u.  Labs Review Labs Reviewed - No data to display  Imaging Review No results found. I have personally reviewed and evaluated these images and lab results as part of my medical decision-making.   EKG  Interpretation None      MDM   Final diagnoses:  None    1. Fall 2. Facial laceration  Patient presents with facial laceration after mechanical fall. No LOC. No evidence or concern for intracranial injury with normal neurologic exam. No change in mentation while in ED. Lac repaired per above note.   I personally performed the services described in this documentation, which was scribed in my presence. The recorded information has been reviewed and is accurate.     Charlann Lange, PA-C 09/30/15 2115  Tanna Furry, MD 10/13/15 [phone]

## 2015-09-30 NOTE — ED Notes (Signed)
Pt arrives from [facility] with [facility]. Pt slipped on some water and fell and hit his head. He has a small lac to the R side of his forehead. Bleeding controlled. He denies LOC, N/V. He is alert, no acute distress. Skin warm, dry.

## 2015-09-30 NOTE — Discharge Instructions (Signed)
Facial Laceration ° A facial laceration is a cut on the face. These injuries can be painful and cause bleeding. Lacerations usually heal quickly, but they need special care to reduce scarring. °DIAGNOSIS  °Your health care provider will take a medical history, ask for details about how the injury occurred, and examine the wound to determine how deep the cut is. °TREATMENT  °Some facial lacerations may not require closure. Others may not be able to be closed because of an increased risk of infection. The risk of infection and the chance for successful closure will depend on various factors, including the amount of time since the injury occurred. °The wound may be cleaned to help prevent infection. If closure is appropriate, pain medicines may be given if needed. Your health care provider will use stitches (sutures), wound glue (adhesive), or skin adhesive strips to repair the laceration. These tools bring the skin edges together to allow for faster healing and a better cosmetic outcome. If needed, you may also be given a tetanus shot. °HOME CARE INSTRUCTIONS °· Only take over-the-counter or prescription medicines as directed by your health care provider. °· Follow your health care provider's instructions for wound care. These instructions will vary depending on the technique used for closing the wound. °For Sutures: °· Keep the wound clean and dry.   °· If you were given a bandage (dressing), you should change it at least once a day. Also change the dressing if it becomes wet or dirty, or as directed by your health care provider.   °· Wash the wound with soap and water 2 times a day. Rinse the wound off with water to remove all soap. Pat the wound dry with a clean towel.   °· After cleaning, apply a thin layer of the antibiotic ointment recommended by your health care provider. This will help prevent infection and keep the dressing from sticking.   °· You may shower as usual after the first 24 hours. Do not soak the  wound in water until the sutures are removed.   °· Get your sutures removed as directed by your health care provider. With facial lacerations, sutures should usually be taken out after 4-5 days to avoid stitch marks.   °· Wait a few days after your sutures are removed before applying any makeup. °For Skin Adhesive Strips: °· Keep the wound clean and dry.   °· Do not get the skin adhesive strips wet. You may bathe carefully, using caution to keep the wound dry.   °· If the wound gets wet, pat it dry with a clean towel.   °· Skin adhesive strips will fall off on their own. You may trim the strips as the wound heals. Do not remove skin adhesive strips that are still stuck to the wound. They will fall off in time.   °For Wound Adhesive: °· You may briefly wet your wound in the shower or bath. Do not soak or scrub the wound. Do not swim. Avoid periods of heavy sweating until the skin adhesive has fallen off on its own. After showering or bathing, gently pat the wound dry with a clean towel.   °· Do not apply liquid medicine, cream medicine, ointment medicine, or makeup to your wound while the skin adhesive is in place. This may loosen the film before your wound is healed.   °· If a dressing is placed over the wound, be careful not to apply tape directly over the skin adhesive. This may cause the adhesive to be pulled off before the wound is healed.   °· Avoid   prolonged exposure to sunlight or tanning lamps while the skin adhesive is in place. °· The skin adhesive will usually remain in place for 5-10 days, then naturally fall off the skin. Do not pick at the adhesive film.   °After Healing: °Once the wound has healed, cover the wound with sunscreen during the day for 1 full year. This can help minimize scarring. Exposure to ultraviolet light in the first year will darken the scar. It can take 1-2 years for the scar to lose its redness and to heal completely.  °SEEK MEDICAL CARE IF: °· You have a fever. °SEEK IMMEDIATE  MEDICAL CARE IF: °· You have redness, pain, or swelling around the wound.   °· You see a yellowish-white fluid (pus) coming from the wound.   °  °This information is not intended to replace advice given to you by your health care provider. Make sure you discuss any questions you have with your health care provider. °  °Document Released: 07/04/2004 Document Revised: 06/17/2014 Document Reviewed: 01/07/2013 °Elsevier Interactive Patient Education ©2016 [employer] ° °

## 2015-10-08 ENCOUNTER — Emergency Department (HOSPITAL_COMMUNITY)
Admission: EM | Admit: 2015-10-08 | Discharge: 2015-10-08 | Disposition: A | Discharge status: Home / Self Care | Payer: Medicare Other | Attending: Emergency Medicine | Admitting: Emergency Medicine

## 2015-10-08 ENCOUNTER — Encounter (HOSPITAL_COMMUNITY): Payer: Self-pay | Admitting: *Deleted

## 2015-10-08 DIAGNOSIS — Z950 Presence of cardiac pacemaker: Secondary | ICD-10-CM | POA: Insufficient documentation

## 2015-10-08 DIAGNOSIS — Z7951 Long term (current) use of inhaled steroids: Secondary | ICD-10-CM | POA: Insufficient documentation

## 2015-10-08 DIAGNOSIS — K6289 Other specified diseases of anus and rectum: Secondary | ICD-10-CM | POA: Diagnosis present

## 2015-10-08 DIAGNOSIS — Z7952 Long term (current) use of systemic steroids: Secondary | ICD-10-CM | POA: Diagnosis not present

## 2015-10-08 DIAGNOSIS — Z86010 Personal history of colonic polyps: Secondary | ICD-10-CM | POA: Insufficient documentation

## 2015-10-08 DIAGNOSIS — Z79891 Long term (current) use of opiate analgesic: Secondary | ICD-10-CM | POA: Insufficient documentation

## 2015-10-08 DIAGNOSIS — N400 Enlarged prostate without lower urinary tract symptoms: Secondary | ICD-10-CM | POA: Diagnosis not present

## 2015-10-08 DIAGNOSIS — E785 Hyperlipidemia, unspecified: Secondary | ICD-10-CM | POA: Insufficient documentation

## 2015-10-08 DIAGNOSIS — I10 Essential (primary) hypertension: Secondary | ICD-10-CM | POA: Insufficient documentation

## 2015-10-08 DIAGNOSIS — F17210 Nicotine dependence, cigarettes, uncomplicated: Secondary | ICD-10-CM | POA: Diagnosis not present

## 2015-10-08 DIAGNOSIS — F329 Major depressive disorder, single episode, unspecified: Secondary | ICD-10-CM | POA: Diagnosis not present

## 2015-10-08 DIAGNOSIS — L22 Diaper dermatitis: Secondary | ICD-10-CM | POA: Insufficient documentation

## 2015-10-08 DIAGNOSIS — Z79899 Other long term (current) drug therapy: Secondary | ICD-10-CM | POA: Insufficient documentation

## 2015-10-08 DIAGNOSIS — I482 Chronic atrial fibrillation: Secondary | ICD-10-CM | POA: Insufficient documentation

## 2015-10-08 NOTE — ED Notes (Signed)
[facility] from [facility].  Reports rectal pain which started last night.  Pt wears briefs, states he has been using the BR on himself.  States "they took out part of my colon."

## 2015-10-08 NOTE — ED Provider Notes (Signed)
CSN: [id number]     Arrival date & time 10/08/15  0801 History   First MD Initiated Contact with Patient 10/08/15 [phone]     Chief Complaint  Patient presents with  . Rectal Pain    HPI Patient presents to the emergency room for evaluation of rectal pain. The patient is currently incarcerated. He initially told the staff at the jail that he was having chest pain. He was brought in for that reason. However when he presented to the emergency department the patient denied having any chest pain and stated he was having pain in his rectal area. Nursing staff noted the patient had soiled his clothing so he was taken to the shower first before my evaluation. Patient denies any abdominal pain. He states he had a lot of diarrhea last evening. He denies being incontinent. He denies any vomiting. He denies fevers. The pain in the rectal area increases with palpation and sitting. He states he did have some chest pain last night but that's all gone now. He denies any trouble with his breathing. Past Medical History  Diagnosis Date  . Depression   . Fall at nursing home     December 2014  . History of migraine   . Gout attack   . Complete heart block ([place])     St. Jude pacemaker - followed by Dr. Lovena Le  . Chronic atrial fibrillation ([place])   . HLD (hyperlipidemia)   . GIB (gastrointestinal bleeding)   . BPH (benign prostatic hyperplasia)   . Essential hypertension   . Gastric ulcer   . Non Hodgkin's lymphoma ([place])   . Tubular adenoma of colon   . Aspiration pneumonia ([place]) 10/07/2014  . 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 Atypical angina ([place]) 12/01/2014  . BLOCK, AV, COMPLETE 12/24/2006    Annotation: Pacer placed Qualifier: Diagnosis of  By: Hilma Favors  DO, Beth    . 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 Fracture femur, cervicotrochanteric ([place]) 05/27/2013    Past Surgical History  Procedure Laterality Date  . Pacemaker generator change      Dr. Lovena Le  . Appendectomy    . Cholecystectomy    . Open reduction of hip Right 05/28/2013    Procedure: OPEN REDUCTION OF HIP;  Surgeon: Renette Butters, MD;  Location: [facility];  Service: Orthopedics;  Laterality: Right;  . Esophagogastroduodenoscopy (egd) with propofol N/A 09/26/2014    Procedure: ESOPHAGOGASTRODUODENOSCOPY (EGD) WITH PROPOFOL;  Surgeon: Jerene Bears, MD;  Location: [facility] ENDOSCOPY;  Service: Endoscopy;  Laterality: N/A;  . Colonoscopy N/A 10/01/2014    Procedure: COLONOSCOPY;  Surgeon: Jerene Bears, MD;  Location[facility] ENDOSCOPY;  Service: Endoscopy;  Laterality: N/A;  . Partial colectomy N/A 10/05/2014    Procedure: SUBTOTAL COLECTOMY WITH ILEORECTAL ANASTOMOSIS;  Surgeon: Donnie Mesa, MD;  Location: [facility];  Service: General;  Laterality: N/A;  . Flexible sigmoidoscopy N/A 11/07/2014    Procedure: FLEXIBLE SIGMOIDOSCOPY;  Surgeon: Jerene Bears, MD;  Location[facility]OSCOPY;  Service: Endoscopy;  Laterality: N/A;  . Gastric resection    . Flexible sigmoidoscopy N/A 07/30/2015    Procedure: FLEXIBLE SIGMOIDOSCOPY;  Surgeon: Doran Stabler, MD;  Location: [facility] ENDOSCOPY;  Service: Endoscopy;  Laterality: N/A;  . Transanal excision of rectal mass N/A 08/03/2015    Procedure: TRANSANAL MINIMALLY INVASIVE  SURGERY TO RESECT RECTAL POLYP;  Surgeon: [name], MD;  Location: [facility] ORS;  Service: General;  Laterality: N/A;  . Flexible sigmoidoscopy N/A 08/03/2015    Procedure: FLEXIBLE SIGMOIDOSCOPY;  Surgeon: [name], MD;  Location: [facility] ORS;  Service: General;  Laterality: N/A;   Family History  Problem Relation Age of Onset  . Colon cancer Mother    Social History  Substance Use Topics  . Smoking status: Current Every Day Smoker -- 0.50 packs/day for 66 years    Types: Cigarettes  . Smokeless tobacco: Former Systems developer    Types: Snuff, Chew     Comment: 05/27/2013 "haven't used chew or snuff  in ~ 30 yr"  . Alcohol Use: 0.0 oz/week    0 Standard drinks or equivalent per week     Comment: "I drink a fifth of bourbon a month" Hadn't had anything in 2 months    Review of Systems  All other systems reviewed and are negative.     Allergies  Review of patient's allergies indicates no known allergies.  Home Medications   Prior to Admission medications   Medication Sig Start Date End Date Taking? Authorizing Provider  albuterol (PROVENTIL HFA;VENTOLIN HFA) 108 (90 Base) MCG/ACT inhaler Inhale 2 puffs into the lungs every 4 (four) hours as needed for wheezing or shortness of breath. 08/07/15  Yes Venetia Maxon Rama, MD  carvedilol (COREG) 6.25 MG tablet Take 1 tablet (6.25 mg total) by mouth 2 (two) times daily with a meal. 09/23/15  Yes Blanchie Dessert, MD  diphenoxylate-atropine (LOMOTIL) 2.5-0.025 MG tablet Take 1 tablet by mouth 4 (four) times daily as needed for diarrhea or loose stools. 09/25/15  Yes Tanna Furry, MD  feeding supplement, ENSURE ENLIVE, (ENSURE ENLIVE) LIQD Take 237 mLs by mouth 2 (two) times daily between meals. 08/07/15  Yes Venetia Maxon Rama, MD  oxyCODONE (OXY IR/ROXICODONE) 5 MG immediate release tablet Take 1 tablet (5 mg total) by mouth every 6 (six) hours as needed for severe pain. Patient not taking: Reported on 09/25/2015 08/07/15   Venetia Maxon Rama, MD  predniSONE (DELTASONE) 20 MG tablet Take 2 tablets (40 mg total) by mouth daily. Patient not taking: Reported on 09/24/2015 09/23/15   Blanchie Dessert, MD   There were no vitals taken for this visit. Physical Exam  Constitutional: No distress.  Thin, appears underweight  HENT:  Head: Normocephalic and atraumatic.  Right Ear: External ear normal.  Left Ear: External ear normal.  Eyes: Conjunctivae are normal. Right eye exhibits no discharge. Left eye exhibits no discharge. No scleral icterus.  Neck: Neck supple. No tracheal deviation present.  Cardiovascular: Normal rate, regular rhythm and intact distal  pulses.   Pulmonary/Chest: Effort normal and breath sounds normal. No stridor. No respiratory distress. He has no wheezes. He has no rales.  Pacemaker noted anterior chest wall on the right side  Abdominal: Soft. Bowel sounds are normal. He exhibits no distension. There is no tenderness. There is no rebound and no guarding.  Genitourinary: Rectal exam shows no external hemorrhoid, no internal hemorrhoid, no mass and anal tone normal.  Patient has erythema on the buttocks and perianal area, the skin appears macerated and there is superficial areas of skin breakdown consistent with a diaper type rash  Musculoskeletal: He exhibits no edema or tenderness.  Neurological: He is alert. He has normal strength. No cranial nerve deficit (no facial droop, extraocular movements intact, no slurred speech) or sensory deficit. He exhibits normal muscle tone. He displays no seizure  activity. Coordination normal.  Skin: Skin is warm and dry. No rash noted.  Multiple areas of excoriation on his skin  Psychiatric: He has a normal mood and affect.  Nursing note and vitals reviewed.   ED Course  Procedures (including critical care time) Labs Review Labs Reviewed - No data to display  Imaging Review No results found. I have personally reviewed and evaluated these images and lab results as part of my medical decision-making.   EKG Interpretation   Date/Time:  Sunday October 08 2015 09:08:40 EDT Ventricular Rate:  98 PR Interval:    QRS Duration: 108 QT Interval:  334 QTC Calculation: 426 R Axis:   140 Text Interpretation:  Atrial fibrillation Anterior infarct, old  Nonspecific T abnormalities, inferior leads Artifact in lead(s) I II III  aVR aVL aVF V1 V2 V3 V4 V5 and baseline wander in lead(s) II III aVR aVF  V3 Poor data quality Otherwise no significant change Confirmed by Ahjanae Cassel   MD-J, Correy Weidner [id number]) on 10/08/2015 9:21:26 AM      MDM   Final diagnoses:  Diaper rash    Patient has history of  chronic recurrent chest pain. He's had multiple visits to the ED in the past with negative workup. He has an active ED care plan which I reviewed during this visit. Patient is currently denying any chest pain. I we will double check an EKG to make sure we don't see any acute abnormalities. I do not feel that any further workup is necessary at this point.  Patient's rectal pain appears to be related to skin irritation. I do not think he has the best hygiene and I feel that this skin irritation is most likely related to swelling his undergarments.  Dorie Rank, MD 10/08/15 [phone]

## 2015-10-08 NOTE — Discharge Instructions (Signed)

## 2015-10-08 NOTE — ED Notes (Signed)
Pt coming from jail with police officer.  Pt initially was transported for cp, has pacemaker.  But started to c/o rectal pain which started last night.  Denies any injury.  Pt also c/o R hip pain which is chronic from fx last year.  Pt ambulates with a limp and a steady gait.  Pt noted to have a soiled brief upon arrival to the ED.  Pt reports he sometimes uses the BR on himself because he cannot make it to the BR.  Assisted pt to the showers.  Pt's bottom was red and excoriated d/t incontinence.  Barrier ointment applied to pt's bottom, pt reports pain while applying barrier ointment.

## 2015-10-08 NOTE — ED Notes (Signed)
Bed: WA03 Expected date:  Expected time:  Means of arrival:  Comments: 

## 2015-10-17 ENCOUNTER — Encounter (HOSPITAL_COMMUNITY): Payer: Self-pay

## 2015-10-17 ENCOUNTER — Emergency Department (HOSPITAL_COMMUNITY)
Admission: EM | Admit: 2015-10-17 | Discharge: 2015-10-17 | Disposition: A | Discharge status: Home / Self Care | Payer: Medicare Other | Attending: Emergency Medicine | Admitting: Emergency Medicine

## 2015-10-17 DIAGNOSIS — I10 Essential (primary) hypertension: Secondary | ICD-10-CM | POA: Insufficient documentation

## 2015-10-17 DIAGNOSIS — R079 Chest pain, unspecified: Secondary | ICD-10-CM | POA: Insufficient documentation

## 2015-10-17 DIAGNOSIS — F17210 Nicotine dependence, cigarettes, uncomplicated: Secondary | ICD-10-CM | POA: Diagnosis not present

## 2015-10-17 NOTE — ED Notes (Signed)
Patient discharging [place]

## 2015-10-17 NOTE — ED Notes (Signed)
Patient reports he had chest pain in middle of chest, not sure what he was doing that caused the pain, "i was just watching tv".

## 2015-10-17 NOTE — ED Notes (Signed)
Pt here for chest pain and believes his PM battery is dead, hx of mulitple visits for same, pt from [facility], snf, given asa and ntg with no relief.

## 2015-10-17 NOTE — ED Notes (Signed)
Patient leaving with security escort.

## 2015-10-17 NOTE — ED Notes (Signed)
Patient attempting to leave. Wanting to smoke. Security notified.

## 2015-10-18 ENCOUNTER — Encounter (HOSPITAL_COMMUNITY): Payer: Self-pay | Admitting: Family Medicine

## 2015-10-18 ENCOUNTER — Emergency Department (HOSPITAL_COMMUNITY)
Admission: EM | Admit: 2015-10-18 | Discharge: 2015-10-18 | Disposition: A | Discharge status: Home / Self Care | Payer: Medicare Other

## 2015-10-18 ENCOUNTER — Emergency Department (HOSPITAL_COMMUNITY)
Admission: EM | Admit: 2015-10-18 | Discharge: 2015-10-18 | Discharge status: Against Medical Advice | Payer: Medicare Other

## 2015-10-18 ENCOUNTER — Emergency Department (HOSPITAL_COMMUNITY)
Admission: EM | Admit: 2015-10-18 | Discharge: 2015-10-18 | Disposition: A | Discharge status: Home / Self Care | Payer: Medicare Other | Attending: Emergency Medicine | Admitting: Emergency Medicine

## 2015-10-18 DIAGNOSIS — Z8679 Personal history of other diseases of the circulatory system: Secondary | ICD-10-CM | POA: Diagnosis not present

## 2015-10-18 DIAGNOSIS — R079 Chest pain, unspecified: Secondary | ICD-10-CM

## 2015-10-18 DIAGNOSIS — Z79899 Other long term (current) drug therapy: Secondary | ICD-10-CM | POA: Diagnosis not present

## 2015-10-18 DIAGNOSIS — Z8781 Personal history of (healed) traumatic fracture: Secondary | ICD-10-CM | POA: Insufficient documentation

## 2015-10-18 DIAGNOSIS — F17210 Nicotine dependence, cigarettes, uncomplicated: Secondary | ICD-10-CM | POA: Insufficient documentation

## 2015-10-18 DIAGNOSIS — Z8659 Personal history of other mental and behavioral disorders: Secondary | ICD-10-CM | POA: Insufficient documentation

## 2015-10-18 DIAGNOSIS — I10 Essential (primary) hypertension: Secondary | ICD-10-CM | POA: Diagnosis not present

## 2015-10-18 DIAGNOSIS — Z87438 Personal history of other diseases of male genital organs: Secondary | ICD-10-CM | POA: Diagnosis not present

## 2015-10-18 DIAGNOSIS — Z8572 Personal history of non-Hodgkin lymphomas: Secondary | ICD-10-CM | POA: Insufficient documentation

## 2015-10-18 DIAGNOSIS — I2510 Atherosclerotic heart disease of native coronary artery without angina pectoris: Secondary | ICD-10-CM | POA: Diagnosis not present

## 2015-10-18 DIAGNOSIS — Z791 Long term (current) use of non-steroidal anti-inflammatories (NSAID): Secondary | ICD-10-CM | POA: Insufficient documentation

## 2015-10-18 DIAGNOSIS — Z86018 Personal history of other benign neoplasm: Secondary | ICD-10-CM | POA: Diagnosis not present

## 2015-10-18 NOTE — ED Notes (Signed)
Pt reports that he is leaving. NAD at this time. Pt continues to refuse vital signs.

## 2015-10-18 NOTE — ED Notes (Signed)
Pt here for chest pain, was just released AMA prior to this visit for chest pain due to not allowing pt to smoke. Pt is a resident of [facility]

## 2015-10-18 NOTE — Discharge Instructions (Signed)

## 2015-10-18 NOTE — ED Notes (Signed)
During triage pt walked out to go smoke, pt instructed not to do so and refused to stay. Pt did not have vitals done prior to this.

## 2015-10-18 NOTE — ED Notes (Signed)
This is the 2nd time pt has called EMS today and then left as soon as he was triaged. The first instance he left before vitals were even done and the 2nd time after EKG was done. Earlier this am [facility] came to take pt back to [facility] and he was missing.

## 2015-10-18 NOTE — ED Notes (Signed)
Went to look for patient after being told he walked out of triage before his vitals were taken; found patient and followed him up the sidewalk; asked patient if he was going to come back to have his vitals taken and to be seen by a doctor and patient said "no" and kept walking; Tommi Rumps, EMT at [facility] aware as well as Tressia Miners, Information systems manager, Therapist, sports in triage

## 2015-10-18 NOTE — ED Notes (Signed)
Pt here for chronic back pain by EMS. Was picked up from [facility] and told not to come back there.

## 2015-10-18 NOTE — ED Notes (Signed)
Pt here by EMS, for THIRD time today and sts he is having chest pain. Pt in afib with hx of same. HR from 90-120.

## 2015-10-18 NOTE — ED Provider Notes (Signed)
CSN: [id number]     Arrival date & time 10/18/15  [id number] History   First MD Initiated Contact with Patient 10/18/15 [phone]     Chief Complaint  Patient presents with  . Chest Pain     (Consider location/radiation/quality/duration/timing/severity/associated sxs/prior Treatment) HPI   [name] is a 70 y.o M with a pmhx of complete heart block with pace maker, A. fib, HLD, HTN who presents the ED today complaining of chest pain. Patient states that he woke up this morning with chest pain and had it all day. He states that this is his typical chest pain. He denies any associated shortness of breath, loss of consciousness, diaphoresis. Pain did not radiate. He states that on the way to the emergency department his chest pain went away. He currently requesting a sandwich. Pt states that he has not seen his cardiologist in a while> pt reports that he takes his medications as prescribed.     Past Medical History  Diagnosis Date  . Depression   . Fall at nursing home     December 2014  . History of migraine   . Gout attack   . Complete heart block ([place])     St. Jude pacemaker - followed by Dr. Lovena Le  . Chronic atrial fibrillation ([place])   . HLD (hyperlipidemia)   . GIB (gastrointestinal bleeding)   . BPH (benign prostatic hyperplasia)   . Essential hypertension   . Gastric ulcer   . Non Hodgkin's lymphoma ([place])   . Tubular adenoma of colon   . Aspiration pneumonia ([place]) 10/07/2014  . 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 Atypical angina ([place]) 12/01/2014  . BLOCK, AV, COMPLETE 12/24/2006    Annotation: Pacer placed Qualifier: Diagnosis of  By: Hilma Favors  DO, Beth    . 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 Fracture femur, cervicotrochanteric ([place]) 05/27/2013   Past Surgical History  Procedure Laterality Date  . Pacemaker generator change      Dr.  Lovena Le  . Appendectomy    . Cholecystectomy    . Open reduction of hip Right 05/28/2013    Procedure: OPEN REDUCTION OF HIP;  Surgeon: Renette Butters, MD;  Location: [facility];  Service: Orthopedics;  Laterality: Right;  . Esophagogastroduodenoscopy (egd) with propofol N/A 09/26/2014    Procedure: ESOPHAGOGASTRODUODENOSCOPY (EGD) WITH PROPOFOL;  Surgeon: Jerene Bears, MD;  Location: [facility] ENDOSCOPY;  Service: Endoscopy;  Laterality: N/A;  . Colonoscopy N/A 10/01/2014    Procedure: COLONOSCOPY;  Surgeon: Jerene Bears, MD;  Location: [facility] ENDOSCOPY;  Service: Endoscopy;  Laterality: N/A;  . Partial colectomy N/A 10/05/2014    Procedure: SUBTOTAL COLECTOMY WITH ILEORECTAL ANASTOMOSIS;  Surgeon: Donnie Mesa, MD;  Location: [facility];  Service: General;  Laterality: N/A;  . Flexible sigmoidoscopy N/A 11/07/2014    Procedure: FLEXIBLE SIGMOIDOSCOPY;  Surgeon: Jerene Bears, MD;  Location: [facility] ENDOSCOPY;  Service: Endoscopy;  Laterality: N/A;  . Gastric resection    . Flexible sigmoidoscopy N/A 07/30/2015    Procedure: FLEXIBLE SIGMOIDOSCOPY;  Surgeon: Doran Stabler, MD;  Location: [facility] ENDOSCOPY;  Service: Endoscopy;  Laterality: N/A;  . Transanal excision of rectal mass N/A 08/03/2015    Procedure: TRANSANAL MINIMALLY INVASIVE SURGERY TO RESECT RECTAL POLYP;  Surgeon: [name], MD;  Location: [facility] ORS;  Service: General;  Laterality: N/A;  . Flexible sigmoidoscopy N/A 08/03/2015    Procedure: FLEXIBLE SIGMOIDOSCOPY;  Surgeon: [name], MD;  Location: [facility] ORS;  Service: General;  Laterality: N/A;   Family History  Problem Relation Age of Onset  . Colon cancer Mother    Social History  Substance Use Topics  . Smoking status: Current Every Day Smoker -- 0.50 packs/day for 66 years    Types: Cigarettes  . Smokeless tobacco: Former Systems developer    Types: Snuff, Chew     Comment: 05/27/2013 "haven't used chew or snuff in ~ 30 yr"  . Alcohol Use: 0.0 oz/week    0 Standard drinks or equivalent per week      Comment: "I drink a fifth of bourbon a month" Hadn't had anything in 2 months    Review of Systems  All other systems reviewed and are negative.     Allergies  Review of patient's allergies indicates no known allergies.  Home Medications   Prior to Admission medications   Medication Sig Start Date End Date Taking? Authorizing Provider  albuterol (PROVENTIL HFA;VENTOLIN HFA) 108 (90 Base) MCG/ACT inhaler Inhale 2 puffs into the lungs every 4 (four) hours as needed for wheezing or shortness of breath. 08/07/15   Venetia Maxon Rama, MD  aspirin EC 81 MG tablet Take 81 mg by mouth daily.    Historical Provider, MD  carvedilol (COREG) 6.25 MG tablet Take 1 tablet (6.25 mg total) by mouth 2 (two) times daily with a meal. 09/23/15   Blanchie Dessert, MD  diphenoxylate-atropine (LOMOTIL) 2.5-0.025 MG tablet Take 1 tablet by mouth 4 (four) times daily as needed for diarrhea or loose stools. 09/25/15   Tanna Furry, MD  feeding supplement, ENSURE ENLIVE, (ENSURE ENLIVE) LIQD Take 237 mLs by mouth 2 (two) times daily between meals. 08/07/15   Venetia Maxon Rama, MD  oxyCODONE (OXY IR/ROXICODONE) 5 MG immediate release tablet Take 1 tablet (5 mg total) by mouth every 6 (six) hours as needed for severe pain. Patient not taking: Reported on 09/25/2015 08/07/15   Venetia Maxon Rama, MD  predniSONE (DELTASONE) 20 MG tablet Take 2 tablets (40 mg total) by mouth daily. Patient not taking: Reported on 09/24/2015 09/23/15   Blanchie Dessert, MD   BP 132/76 mmHg  Pulse 60  Temp(Src) 98 F (36.7 C) (Oral)  Resp 18  SpO2 100% Physical Exam  Constitutional: He is oriented to person, place, and time. He appears well-developed and well-nourished. No distress.  Disheveled  HENT:  Head: Normocephalic and atraumatic.  Mouth/Throat: No oropharyngeal exudate.  Eyes: Conjunctivae and EOM are normal. Pupils are equal, round, and reactive to light. Right eye exhibits no discharge. Left eye exhibits no discharge. No scleral  icterus.  Neck: Neck supple.  Cardiovascular: Normal rate, regular rhythm, normal heart sounds and intact distal pulses.  Exam reveals no gallop and no friction rub.   No murmur heard. Pulmonary/Chest: Effort normal and breath sounds normal. No respiratory distress. He has no wheezes. He has no rales. He exhibits no tenderness.  Abdominal: Soft. Bowel sounds are normal. He exhibits no distension and no mass. There is no tenderness. There is no rebound and no guarding.  Musculoskeletal: Normal range of motion. He exhibits no edema.  Lymphadenopathy:    He has no cervical adenopathy.  Neurological: He is alert and oriented to person, place, and time. No cranial nerve deficit.  Skin: Skin is warm and dry. No rash noted. He is not diaphoretic. No erythema. No pallor.  Psychiatric:  He has a normal mood and affect. His behavior is normal.  Nursing note and vitals reviewed.   ED Course  Procedures (including critical care time) Labs Review Labs Reviewed - No data to display  Imaging Review No results found. I have personally reviewed and evaluated these images and lab results as part of my medical decision-making.   EKG Interpretation   Date/Time:  Wednesday Oct 18 2015 05:21:00 EDT Ventricular Rate:  94 PR Interval:    QRS Duration: 100 QT Interval:  342 QTC Calculation: 427 R Axis:   132 Text Interpretation:  Atrial fibrillation with premature ventricular or  aberrantly conducted complexes Right axis deviation Incomplete right  bundle branch block Possible Right ventricular hypertrophy Septal infarct  , age undetermined T wave abnormality, consider inferior ischemia Abnormal  ECG No significant change since last tracing ([facility] seen Apr 30) Confirmed  by Glynn Octave [phone]) on 10/18/2015 6:52:34 AM      MDM   Final diagnoses:  Chest pain, unspecified chest pain type   70 year old male with history of complete heart block with a pacemaker, A. fib presents the ED  complaining of chest pain. Patient appears on the ED in no apparent distress. He states that his chest pain completely resolved before he got to the emergency department. Of note, patient has ED care plan and is been seen in the ED 35 times the last 6 months with similar symptoms. EKG was unchanged from previous. Upon my examination patient states "I feel fine now and I want to go home". Patient also requesting [place] sandwich and has been going through all of the drawers in the exam room attempting to steal items from the room. Given patient's request to leave and his multiple ED visits for similar symptoms feel that he is safe for discharge with cardiology follow-up. No other lab work indicated at this time as he is currently symptom free. All vitals are stable. Discussed treatment plan the patient was agreeable. Return precautions outlined in patient discharge instructions.  Case discussed with Dr. Claudine Mouton who agrees with treatment plan.     Dondra Spry [place], PA-C 10/18/15 [id number]  Everlene Balls, MD 10/18/15 [phone]

## 2015-10-18 NOTE — ED Notes (Signed)
Pt went to the lobby before he could sign the discharge paperwork.  [facility] has been called for transport after RN verified [place] is his perm. Residents.

## 2015-10-18 NOTE — ED Notes (Signed)
Notified [facility] for transportation back home 

## 2015-10-19 ENCOUNTER — Encounter (HOSPITAL_COMMUNITY): Payer: Self-pay | Admitting: Emergency Medicine

## 2015-10-19 ENCOUNTER — Emergency Department (HOSPITAL_COMMUNITY)
Admission: EM | Admit: 2015-10-19 | Discharge: 2015-10-19 | Disposition: A | Discharge status: Home / Self Care | Payer: Medicare Other | Attending: Emergency Medicine | Admitting: Emergency Medicine

## 2015-10-19 DIAGNOSIS — Z8701 Personal history of pneumonia (recurrent): Secondary | ICD-10-CM | POA: Insufficient documentation

## 2015-10-19 DIAGNOSIS — Z8639 Personal history of other endocrine, nutritional and metabolic disease: Secondary | ICD-10-CM | POA: Insufficient documentation

## 2015-10-19 DIAGNOSIS — Z8659 Personal history of other mental and behavioral disorders: Secondary | ICD-10-CM | POA: Insufficient documentation

## 2015-10-19 DIAGNOSIS — Z79899 Other long term (current) drug therapy: Secondary | ICD-10-CM | POA: Diagnosis not present

## 2015-10-19 DIAGNOSIS — Z8781 Personal history of (healed) traumatic fracture: Secondary | ICD-10-CM | POA: Diagnosis not present

## 2015-10-19 DIAGNOSIS — Z7982 Long term (current) use of aspirin: Secondary | ICD-10-CM | POA: Diagnosis not present

## 2015-10-19 DIAGNOSIS — Z86018 Personal history of other benign neoplasm: Secondary | ICD-10-CM | POA: Insufficient documentation

## 2015-10-19 DIAGNOSIS — Z8719 Personal history of other diseases of the digestive system: Secondary | ICD-10-CM | POA: Diagnosis not present

## 2015-10-19 DIAGNOSIS — I10 Essential (primary) hypertension: Secondary | ICD-10-CM | POA: Diagnosis not present

## 2015-10-19 DIAGNOSIS — Z8572 Personal history of non-Hodgkin lymphomas: Secondary | ICD-10-CM | POA: Diagnosis not present

## 2015-10-19 DIAGNOSIS — I2510 Atherosclerotic heart disease of native coronary artery without angina pectoris: Secondary | ICD-10-CM | POA: Insufficient documentation

## 2015-10-19 DIAGNOSIS — F17210 Nicotine dependence, cigarettes, uncomplicated: Secondary | ICD-10-CM | POA: Insufficient documentation

## 2015-10-19 DIAGNOSIS — M25551 Pain in right hip: Secondary | ICD-10-CM | POA: Insufficient documentation

## 2015-10-19 DIAGNOSIS — Z87438 Personal history of other diseases of male genital organs: Secondary | ICD-10-CM | POA: Insufficient documentation

## 2015-10-19 MED ORDER — ACETAMINOPHEN 500 MG PO TABS
1000.0000 mg | ORAL_TABLET | Freq: Once | ORAL | Status: AC
  Administered 2015-10-19: 1000 mg via ORAL
  Filled 2015-10-19: qty 2

## 2015-10-19 NOTE — ED Notes (Signed)
Pt homeless, arrives via [facility] with c/o R Hip pain. Pt denies fall or trauma but same hip was broken last year. Alert, VSS, reports 8/10 pain to R Hip.

## 2015-10-19 NOTE — ED Provider Notes (Signed)
CSN: [id number]     Arrival date & time 10/19/15  1004 History   First MD Initiated Contact with Patient 10/19/15 1018     Chief Complaint  Patient presents with  . Hip Pain     (Consider location/radiation/quality/duration/timing/severity/associated sxs/prior Treatment) Patient is a 69 y.o. male presenting with hip pain.  Hip Pain This is a recurrent (reports pain in hip after fall one year ago, worsened last night) problem. The current episode started 3 to 5 hours ago. The problem occurs constantly. The problem has not changed since onset.Pertinent negatives include no chest pain, no abdominal pain, no headaches and no shortness of breath. Nothing aggravates the symptoms. Nothing relieves the symptoms. He has tried nothing for the symptoms. The treatment provided no relief.    Past Medical History  Diagnosis Date  . Depression   . Fall at nursing home     December 2014  . History of migraine   . Gout attack   . Complete heart block ([place])     St. Jude pacemaker - followed by Dr. Lovena Le  . Chronic atrial fibrillation ([place])   . HLD (hyperlipidemia)   . GIB (gastrointestinal bleeding)   . BPH (benign prostatic hyperplasia)   . Essential hypertension   . Gastric ulcer   . Non Hodgkin's lymphoma ([place])   . Tubular adenoma of colon   . Aspiration pneumonia ([place]) 10/07/2014  . 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 Atypical angina ([place]) 12/01/2014  . BLOCK, AV, COMPLETE 12/24/2006    Annotation: Pacer placed Qualifier: Diagnosis of  By: Hilma Favors  DO, Beth    . 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 Fracture femur, cervicotrochanteric ([place]) 05/27/2013   Past Surgical History  Procedure Laterality Date  . Pacemaker generator change      Dr. Lovena Le  . Appendectomy    . Cholecystectomy    . Open reduction of hip Right 05/28/2013    Procedure:  OPEN REDUCTION OF HIP;  Surgeon: Renette Butters, MD;  Location: [place];  Service: Orthopedics;  Laterality: Right;  . Esophagogastroduodenoscopy (egd) with propofol N/A 09/26/2014    Procedure: ESOPHAGOGASTRODUODENOSCOPY (EGD) WITH PROPOFOL;  Surgeon: Jerene Bears, MD;  Location: [facility] ENDOSCOPY;  Service: Endoscopy;  Laterality: N/A;  . Colonoscopy N/A 10/01/2014    Procedure: COLONOSCOPY;  Surgeon: Jerene Bears, MD;  Location: [facility] ENDOSCOPY;  Service: Endoscopy;  Laterality: N/A;  . Partial colectomy N/A 10/05/2014    Procedure: SUBTOTAL COLECTOMY WITH ILEORECTAL ANASTOMOSIS;  Surgeon: Donnie Mesa, MD;  Location: [place];  Service: General;  Laterality: N/A;  . Flexible sigmoidoscopy N/A 11/07/2014    Procedure: FLEXIBLE SIGMOIDOSCOPY;  Surgeon: Jerene Bears, MD;  Location: [facility] ENDOSCOPY;  Service: Endoscopy;  Laterality: N/A;  . Gastric resection    . Flexible sigmoidoscopy N/A 07/30/2015    Procedure: FLEXIBLE SIGMOIDOSCOPY;  Surgeon: Doran Stabler, MD;  Location: [facility] ENDOSCOPY;  Service: Endoscopy;  Laterality: N/A;  . Transanal excision of rectal mass N/A 08/03/2015    Procedure: TRANSANAL MINIMALLY INVASIVE SURGERY TO RESECT RECTAL POLYP;  Surgeon: [name], MD;  Location: [facility] ORS;  Service: General;  Laterality: N/A;  . Flexible sigmoidoscopy N/A 08/03/2015    Procedure: FLEXIBLE SIGMOIDOSCOPY;  Surgeon: [name], MD;  Location: [facility] ORS;  Service: General;  Laterality: N/A;  Family History  Problem Relation Age of Onset  . Colon cancer Mother    Social History  Substance Use Topics  . Smoking status: Current Every Day Smoker -- 0.50 packs/day for 66 years    Types: Cigarettes  . Smokeless tobacco: Former Systems developer    Types: Snuff, Chew     Comment: 05/27/2013 "haven't used chew or snuff in ~ 30 yr"  . Alcohol Use: 0.0 oz/week    0 Standard drinks or equivalent per week     Comment: "I drink a fifth of bourbon a month" Hadn't had anything in 2 months    Review of Systems   Constitutional: Negative for fever.  HENT: Negative for sore throat.   Eyes: Negative for visual disturbance.  Respiratory: Negative for shortness of breath.   Cardiovascular: Negative for chest pain.  Gastrointestinal: Negative for abdominal pain.  Genitourinary: Negative for difficulty urinating.  Musculoskeletal: Positive for arthralgias. Negative for back pain and neck stiffness.  Skin: Negative for rash.  Neurological: Negative for syncope and headaches.      Allergies  Review of patient's allergies indicates no known allergies.  Home Medications   Prior to Admission medications   Medication Sig Start Date End Date Taking? Authorizing Provider  albuterol (PROVENTIL HFA;VENTOLIN HFA) 108 (90 Base) MCG/ACT inhaler Inhale 2 puffs into the lungs every 4 (four) hours as needed for wheezing or shortness of breath. 08/07/15   Venetia Maxon Rama, MD  aspirin EC 81 MG tablet Take 81 mg by mouth daily.    Historical Provider, MD  carvedilol (COREG) 6.25 MG tablet Take 1 tablet (6.25 mg total) by mouth 2 (two) times daily with a meal. 09/23/15   Blanchie Dessert, MD  diphenoxylate-atropine (LOMOTIL) 2.5-0.025 MG tablet Take 1 tablet by mouth 4 (four) times daily as needed for diarrhea or loose stools. 09/25/15   Tanna Furry, MD  feeding supplement, ENSURE ENLIVE, (ENSURE ENLIVE) LIQD Take 237 mLs by mouth 2 (two) times daily between meals. 08/07/15   Venetia Maxon Rama, MD  oxyCODONE (OXY IR/ROXICODONE) 5 MG immediate release tablet Take 1 tablet (5 mg total) by mouth every 6 (six) hours as needed for severe pain. Patient not taking: Reported on 09/25/2015 08/07/15   Venetia Maxon Rama, MD  predniSONE (DELTASONE) 20 MG tablet Take 2 tablets (40 mg total) by mouth daily. Patient not taking: Reported on 09/24/2015 09/23/15   Blanchie Dessert, MD   BP 125/99 mmHg  Pulse 101  Temp(Src) 98.1 F (36.7 C) (Oral)  Resp 14  SpO2 99% Physical Exam  Constitutional: He is oriented to person, place, and time.  He appears cachectic. No distress.  HENT:  Head: Normocephalic and atraumatic.  Eyes: Conjunctivae and EOM are normal.  Neck: Normal range of motion.  Cardiovascular: Normal rate, regular rhythm, normal heart sounds and intact distal pulses.  Exam reveals no gallop and no friction rub.   No murmur heard. Pulmonary/Chest: Effort normal and breath sounds normal. No respiratory distress. He has no wheezes. He has no rales.  Abdominal: Soft. He exhibits no distension. There is no tenderness. There is no guarding.  Musculoskeletal: He exhibits no edema.       Right hip: He exhibits tenderness. He exhibits normal range of motion, normal strength, no swelling, no crepitus, no deformity and no laceration.  Neurological: He is alert and oriented to person, place, and time.  Skin: Skin is warm and dry. He is not diaphoretic.  Nursing note and vitals reviewed.   ED Course  Procedures (including critical care time) Labs Review Labs Reviewed - No data to display  Imaging Review No results found. I have personally reviewed and evaluated these images and lab results as part of my medical decision-making.   EKG Interpretation None      MDM   Final diagnoses:  None   70yo male with hx of frequent ED visits, atrial fibrillation, hyperlipidemia, non hodgkin's lymphoma, pacemaker, presents with concern for right hip pain. Pt without new trauma, doubt fx. Has normal strength, normal ROM, doubt septic arthritis.  Likely exacerbation of chronic pain.  Given tylenol.  Patient discharged in stable condition with understanding of reasons to return.    Gareth Morgan, MD 10/19/15 2312

## 2015-10-19 NOTE — ED Notes (Signed)
Patient was not answering and this nurse was told he was taken to jail

## 2015-10-19 NOTE — ED Notes (Signed)
Pt c/o hip pain that he reports got worse last night. Pt reports old hip fracture that causes him chronic pain. Pt a/o nad.

## 2015-10-20 ENCOUNTER — Emergency Department (HOSPITAL_COMMUNITY)
Admission: EM | Admit: 2015-10-20 | Discharge: 2015-10-20 | Disposition: A | Discharge status: Home / Self Care | Payer: Medicare Other | Source: Home / Self Care

## 2015-10-20 ENCOUNTER — Emergency Department (HOSPITAL_COMMUNITY)
Admission: EM | Admit: 2015-10-20 | Discharge: 2015-10-20 | Disposition: A | Discharge status: Home / Self Care | Payer: Medicare Other | Source: Home / Self Care | Attending: Emergency Medicine | Admitting: Emergency Medicine

## 2015-10-20 ENCOUNTER — Encounter (HOSPITAL_COMMUNITY): Payer: Self-pay | Admitting: Vascular Surgery

## 2015-10-20 ENCOUNTER — Other Ambulatory Visit: Payer: Self-pay

## 2015-10-20 ENCOUNTER — Encounter (HOSPITAL_COMMUNITY): Payer: Self-pay

## 2015-10-20 ENCOUNTER — Emergency Department (HOSPITAL_COMMUNITY)
Admission: EM | Admit: 2015-10-20 | Discharge: 2015-10-21 | Disposition: A | Discharge status: Home / Self Care | Payer: Medicare Other | Attending: Emergency Medicine | Admitting: Emergency Medicine

## 2015-10-20 ENCOUNTER — Encounter (HOSPITAL_COMMUNITY): Payer: Self-pay | Admitting: *Deleted

## 2015-10-20 ENCOUNTER — Emergency Department (HOSPITAL_COMMUNITY)
Admission: EM | Admit: 2015-10-20 | Discharge: 2015-10-20 | Disposition: A | Discharge status: Home / Self Care | Payer: Medicare Other | Attending: Emergency Medicine | Admitting: Emergency Medicine

## 2015-10-20 DIAGNOSIS — I209 Angina pectoris, unspecified: Secondary | ICD-10-CM | POA: Insufficient documentation

## 2015-10-20 DIAGNOSIS — Z8719 Personal history of other diseases of the digestive system: Secondary | ICD-10-CM | POA: Diagnosis not present

## 2015-10-20 DIAGNOSIS — I25118 Atherosclerotic heart disease of native coronary artery with other forms of angina pectoris: Secondary | ICD-10-CM | POA: Insufficient documentation

## 2015-10-20 DIAGNOSIS — G8929 Other chronic pain: Secondary | ICD-10-CM

## 2015-10-20 DIAGNOSIS — R079 Chest pain, unspecified: Secondary | ICD-10-CM | POA: Insufficient documentation

## 2015-10-20 DIAGNOSIS — Z8701 Personal history of pneumonia (recurrent): Secondary | ICD-10-CM | POA: Insufficient documentation

## 2015-10-20 DIAGNOSIS — M25559 Pain in unspecified hip: Secondary | ICD-10-CM

## 2015-10-20 DIAGNOSIS — Z8659 Personal history of other mental and behavioral disorders: Secondary | ICD-10-CM | POA: Diagnosis not present

## 2015-10-20 DIAGNOSIS — Z86010 Personal history of colonic polyps: Secondary | ICD-10-CM

## 2015-10-20 DIAGNOSIS — Z87438 Personal history of other diseases of male genital organs: Secondary | ICD-10-CM | POA: Insufficient documentation

## 2015-10-20 DIAGNOSIS — Z8572 Personal history of non-Hodgkin lymphomas: Secondary | ICD-10-CM | POA: Diagnosis not present

## 2015-10-20 DIAGNOSIS — Z8639 Personal history of other endocrine, nutritional and metabolic disease: Secondary | ICD-10-CM | POA: Insufficient documentation

## 2015-10-20 DIAGNOSIS — E785 Hyperlipidemia, unspecified: Secondary | ICD-10-CM | POA: Insufficient documentation

## 2015-10-20 DIAGNOSIS — I482 Chronic atrial fibrillation: Secondary | ICD-10-CM | POA: Diagnosis not present

## 2015-10-20 DIAGNOSIS — Z8781 Personal history of (healed) traumatic fracture: Secondary | ICD-10-CM | POA: Insufficient documentation

## 2015-10-20 DIAGNOSIS — Z79899 Other long term (current) drug therapy: Secondary | ICD-10-CM | POA: Insufficient documentation

## 2015-10-20 DIAGNOSIS — I25119 Atherosclerotic heart disease of native coronary artery with unspecified angina pectoris: Secondary | ICD-10-CM

## 2015-10-20 DIAGNOSIS — Z765 Malingerer [conscious simulation]: Secondary | ICD-10-CM

## 2015-10-20 DIAGNOSIS — I10 Essential (primary) hypertension: Secondary | ICD-10-CM | POA: Insufficient documentation

## 2015-10-20 DIAGNOSIS — I252 Old myocardial infarction: Secondary | ICD-10-CM | POA: Diagnosis not present

## 2015-10-20 DIAGNOSIS — F17210 Nicotine dependence, cigarettes, uncomplicated: Secondary | ICD-10-CM | POA: Insufficient documentation

## 2015-10-20 DIAGNOSIS — M25551 Pain in right hip: Secondary | ICD-10-CM | POA: Diagnosis present

## 2015-10-20 DIAGNOSIS — Z590 Homelessness: Secondary | ICD-10-CM | POA: Insufficient documentation

## 2015-10-20 DIAGNOSIS — Z7982 Long term (current) use of aspirin: Secondary | ICD-10-CM | POA: Diagnosis not present

## 2015-10-20 DIAGNOSIS — Z86018 Personal history of other benign neoplasm: Secondary | ICD-10-CM | POA: Insufficient documentation

## 2015-10-20 NOTE — ED Notes (Signed)
Pt states left chest pain. 10/10. Started earlier today.

## 2015-10-20 NOTE — ED Notes (Signed)
[facility]- pt coming in with c/o chest pain and reports his defib fired. Pt reported 10/10 pain to EMS. Given 324mg  of ASA and 1 nitro with no change. Hx of same. Vital signs stable, IV in place.

## 2015-10-20 NOTE — ED Notes (Signed)
Pt reports to the ED for eval of right hip pain. He reports he has had the pain x one year ago when he broke it. He did have surgery and rehab. Pt denies any recent re-injury. VSS en route. Pt was just dc this am at 10:00 am. Pt A&Ox4, resp e/u, and skin warm and dry.

## 2015-10-20 NOTE — ED Notes (Signed)
Pt did not answer for re-assessment. 

## 2015-10-20 NOTE — ED Notes (Signed)
Pt arrived by [facility] for chronic chest pain. He called ems due to "his defib firing and his shins trying to murder him." pt has been here multiple times today for same.

## 2015-10-20 NOTE — Discharge Instructions (Signed)

## 2015-10-20 NOTE — ED Provider Notes (Signed)
CSN: [id number]     Arrival date & time 10/20/15  0741 History   First MD Initiated Contact with Patient 10/20/15 0745     Chief Complaint  Patient presents with  . Chest Pain  . Atrial Fibrillation     Patient is a 70 y.o. male presenting with chest pain and atrial fibrillation. The history is provided by the patient.  Chest Pain Pain location:  L chest Pain quality: aching   Pain radiates to:  Does not radiate Pain severity:  Moderate Onset quality:  Gradual Timing:  Constant Progression:  Unchanged Chronicity:  Chronic Relieved by:  Nothing Worsened by:  Movement (palpation) Associated symptoms: no fever   Atrial Fibrillation Associated symptoms include chest pain.  Patient with h/o multiple medical problems including chronic daily chest pain, h/o afib, h/o heart block requiring pacemaker presents for his chronic chest pain and reports his defibrillator fired last night He reports chronic hip pain but is ambulatory He denies any other complaints   Past Medical History  Diagnosis Date  . Depression   . Fall at nursing home     December 2014  . History of migraine   . Gout attack   . Complete heart block ([place])     St. Jude pacemaker - followed by Dr. Lovena Le  . Chronic atrial fibrillation ([place])   . HLD (hyperlipidemia)   . GIB (gastrointestinal bleeding)   . BPH (benign prostatic hyperplasia)   . Essential hypertension   . Gastric ulcer   . Non Hodgkin's lymphoma ([place])   . Tubular adenoma of colon   . Aspiration pneumonia ([place]) 10/07/2014  . 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 Atypical angina ([place]) 12/01/2014  . BLOCK, AV, COMPLETE 12/24/2006    Annotation: Pacer placed Qualifier: Diagnosis of  By: Hilma Favors  DO, Beth    . 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 Fracture femur, cervicotrochanteric ([place]) 05/27/2013   Past  Surgical History  Procedure Laterality Date  . Pacemaker generator change      Dr. Lovena Le  . Appendectomy    . Cholecystectomy    . Open reduction of hip Right 05/28/2013    Procedure: OPEN REDUCTION OF HIP;  Surgeon: Renette Butters, MD;  Location: [facility];  Service: Orthopedics;  Laterality: Right;  . Esophagogastroduodenoscopy (egd) with propofol N/A 09/26/2014    Procedure: ESOPHAGOGASTRODUODENOSCOPY (EGD) WITH PROPOFOL;  Surgeon: Jerene Bears, MD;  Location: [facility] ENDOSCOPY;  Service: Endoscopy;  Laterality: N/A;  . Colonoscopy N/A 10/01/2014    Procedure: COLONOSCOPY;  Surgeon: Jerene Bears, MD;  Location: [facility] ENDOSCOPY;  Service: Endoscopy;  Laterality: N/A;  . Partial colectomy N/A 10/05/2014    Procedure: SUBTOTAL COLECTOMY WITH ILEORECTAL ANASTOMOSIS;  Surgeon: Donnie Mesa, MD;  Location: [place];  Service: General;  Laterality: N/A;  . Flexible sigmoidoscopy N/A 11/07/2014    Procedure: FLEXIBLE SIGMOIDOSCOPY;  Surgeon: Jerene Bears, MD;  Location: [facility] ENDOSCOPY;  Service: Endoscopy;  Laterality: N/A;  . Gastric resection    . Flexible sigmoidoscopy N/A 07/30/2015    Procedure: FLEXIBLE SIGMOIDOSCOPY;  Surgeon: Doran Stabler, MD;  Location: [facility] ENDOSCOPY;  Service: Endoscopy;  Laterality: N/A;  . Transanal excision of rectal mass N/A 08/03/2015    Procedure: TRANSANAL MINIMALLY INVASIVE SURGERY TO RESECT RECTAL POLYP;  Surgeon: [name], MD;  Location: [facility];  Service: General;  Laterality: N/A;  . Flexible sigmoidoscopy N/A 08/03/2015    Procedure: FLEXIBLE SIGMOIDOSCOPY;  Surgeon: [name], MD;  Location: [facility];  Service: General;  Laterality: N/A;   Family History  Problem Relation Age of Onset  . Colon cancer Mother    Social History  Substance Use Topics  . Smoking status: Current Every Day Smoker -- 0.50 packs/day for 66 years    Types: Cigarettes  . Smokeless tobacco: Former Systems developer    Types: Snuff, Chew     Comment: 05/27/2013 "haven't used chew or snuff in ~ 30  yr"  . Alcohol Use: 0.0 oz/week    0 Standard drinks or equivalent per week     Comment: "I drink a fifth of bourbon a month" Hadn't had anything in 2 months    Review of Systems  Constitutional: Negative for fever.  Cardiovascular: Positive for chest pain.  Musculoskeletal: Positive for arthralgias.       Chronic hip pain       Allergies  Review of patient's allergies indicates no known allergies.  Home Medications   Prior to Admission medications   Medication Sig Start Date End Date Taking? Authorizing Provider  albuterol (PROVENTIL HFA;VENTOLIN HFA) 108 (90 Base) MCG/ACT inhaler Inhale 2 puffs into the lungs every 4 (four) hours as needed for wheezing or shortness of breath. 08/07/15   Venetia Maxon Rama, MD  aspirin EC 81 MG tablet Take 81 mg by mouth daily.    Historical Provider, MD  carvedilol (COREG) 6.25 MG tablet Take 1 tablet (6.25 mg total) by mouth 2 (two) times daily with a meal. 09/23/15   Blanchie Dessert, MD  diphenoxylate-atropine (LOMOTIL) 2.5-0.025 MG tablet Take 1 tablet by mouth 4 (four) times daily as needed for diarrhea or loose stools. 09/25/15   Tanna Furry, MD  feeding supplement, ENSURE ENLIVE, (ENSURE ENLIVE) LIQD Take 237 mLs by mouth 2 (two) times daily between meals. 08/07/15   Venetia Maxon Rama, MD  oxyCODONE (OXY IR/ROXICODONE) 5 MG immediate release tablet Take 1 tablet (5 mg total) by mouth every 6 (six) hours as needed for severe pain. Patient not taking: Reported on 09/25/2015 08/07/15   Venetia Maxon Rama, MD  predniSONE (DELTASONE) 20 MG tablet Take 2 tablets (40 mg total) by mouth daily. Patient not taking: Reported on 09/24/2015 09/23/15   Blanchie Dessert, MD   BP 116/69 mmHg  Pulse 86  Temp(Src) 97.6 F (36.4 C) (Oral)  Resp 15  SpO2 100% Physical Exam CONSTITUTIONAL: Disheveled, no distress HEAD: Normocephalic/atraumatic EYES: EOMI ENMT: Mucous membranes moist, poor dentition NECK: supple no meningeal signs CV: S1/S2 noted, no  murmurs/rubs/gallops noted Chest - tenderness to left chest, no bruising or crepitus LUNGS: Lungs are clear to auscultation bilaterally, no apparent distress ABDOMEN: soft, nontender NEURO: Pt is awake/alert/appropriate, moves all extremitiesx4.  Sleeping on arrival EXTREMITIES: pulses normal/equal, full ROM, no deformities, full ROM of both hips without difficulty SKIN: warm, color normal PSYCH: no abnormalities of mood noted, alert and oriented to situation  ED Course  Procedures    EKG Interpretation   Date/Time:  Friday Oct 20 2015 08:08:16 EDT Ventricular Rate:  95 PR Interval:    QRS Duration: 97 QT Interval:  341 QTC Calculation: 429 R Axis:   -100 Text Interpretation:  Atrial fibrillation Left anterior fascicular block  Anterolateral infarct, old No significant change since last tracing  Confirmed by Christy Gentles  MD, Tayvon Culley ([id number]) on 10/20/2015 9:04:03 AM  Pt with chronic CP EKG unchanged from prior (chronic afib, per records not anticoagulation candidate due to noncompliance) He is in no distress I don't feel emergent cardiac workup warranted He reports his "defib fired" but all of the records I reviewed indicates he only has pacemaker Advised f/u with cardiology  MDM   Final diagnoses:  Chest pain, unspecified chest pain type    Nursing notes including past medical history and social history reviewed and considered in documentation Previous records reviewed and considered     Ripley Fraise, MD 10/20/15 1006

## 2015-10-20 NOTE — Discharge Instructions (Signed)
Please read and follow all provided instructions.  Your diagnoses today include:  1. Chronic hip pain, right    Tests performed today include:  Vital signs. See below for your results today.   Medications prescribed:   Protect (with brace, splint, sling), if given by your provider Rest Ice- Do not apply ice pack directly to your skin, place towel or similar between your skin and ice/ice pack. Apply ice for 20 min, then remove for 40 min while awake Compression- Wear brace, elastic bandage, splint as directed by your provider Elevate affected extremity above the level of your heart when not walking around for the first 24-48 hours   Use Ibuprofen (Motrin/Advil) 600mg  every 8 hours as needed for pain   Home care instructions:  Follow any educational materials contained in this packet.  Follow-up instructions: Please follow-up with your primary care provider in the next 48 hours for further evaluation of symptoms and treatment   Return instructions:   Please return to the Emergency Department if you do not get better, if you get worse, or new symptoms OR  - Fever (temperature greater than 101.63F)  - Bleeding that does not stop with holding pressure to the area    -Severe pain (please note that you may be more sore the day after your accident)  - Chest Pain  - Difficulty breathing  - Severe nausea or vomiting  - Inability to tolerate food and liquids  - Passing out  - Skin becoming red around your wounds  - Change in mental status (confusion or lethargy)  - New numbness or weakness     Please return if you have any other emergent concerns.  Additional Information:  Your vital signs today were: BP 122/58 mmHg   Pulse 86   Temp(Src) 98.2 F (36.8 C) (Oral)   Resp 16   SpO2 97% If your blood pressure (BP) was elevated above 135/85 this visit, please have this repeated by your doctor within one month. ---------------

## 2015-10-20 NOTE — ED Notes (Signed)
No answer in waiting area.

## 2015-10-20 NOTE — ED Provider Notes (Signed)
CSN: [id number]     Arrival date & time 10/20/15  1344 History  By signing my name below, I, [name], attest that this documentation has been prepared under the direction and in the presence of Damon Decamp, PA-C. Electronically Signed: Rayna Goodwin, ED Scribe. 10/20/2015. 2:01 PM.   Chief Complaint  Patient presents with  . Hip Pain   The history is provided by the patient. No language interpreter was used.   HPI Comments: [name] is a 70 y.o. male who presents to the Emergency Department complaining of chronic right hip pain x 1 year. Pt was seen this morning with chronic CP and was d/c in stable condition following an EKG with no acute findings. Pt has multiple recent ED visits. He has a hx of homelessness and a care plan in place. He denies any new injuries or any recent trauma.   Past Medical History  Diagnosis Date  . Depression   . Fall at nursing home     December 2014  . History of migraine   . Gout attack   . Complete heart block ([place])     St. Jude pacemaker - followed by Dr. Lovena Le  . Chronic atrial fibrillation ([place])   . HLD (hyperlipidemia)   . GIB (gastrointestinal bleeding)   . BPH (benign prostatic hyperplasia)   . Essential hypertension   . Gastric ulcer   . Non Hodgkin's lymphoma ([place])   . Tubular adenoma of colon   . Aspiration pneumonia ([place]) 10/07/2014  . 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 Atypical angina ([place]) 12/01/2014  . BLOCK, AV, COMPLETE 12/24/2006    Annotation: Pacer placed Qualifier: Diagnosis of  By: Hilma Favors  DO, Beth    . 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 Fracture femur, cervicotrochanteric ([place]) 05/27/2013   Past Surgical History  Procedure Laterality Date  . Pacemaker generator change      Dr. Lovena Le  . Appendectomy    . Cholecystectomy    . Open reduction of hip Right 05/28/2013     Procedure: OPEN REDUCTION OF HIP;  Surgeon: Renette Butters, MD;  Location: [place];  Service: Orthopedics;  Laterality: Right;  . Esophagogastroduodenoscopy (egd) with propofol N/A 09/26/2014    Procedure: ESOPHAGOGASTRODUODENOSCOPY (EGD) WITH PROPOFOL;  Surgeon: Jerene Bears, MD;  Location: [facility] ENDOSCOPY;  Service: Endoscopy;  Laterality: N/A;  . Colonoscopy N/A 10/01/2014    Procedure: COLONOSCOPY;  Surgeon: Jerene Bears, MD;  Location: [facility] ENDOSCOPY;  Service: Endoscopy;  Laterality: N/A;  . Partial colectomy N/A 10/05/2014    Procedure: SUBTOTAL COLECTOMY WITH ILEORECTAL ANASTOMOSIS;  Surgeon: Donnie Mesa, MD;  Location: [place];  Service: General;  Laterality: N/A;  . Flexible sigmoidoscopy N/A 11/07/2014    Procedure: FLEXIBLE SIGMOIDOSCOPY;  Surgeon: Jerene Bears, MD;  Location: [facility] ENDOSCOPY;  Service: Endoscopy;  Laterality: N/A;  . Gastric resection    . Flexible sigmoidoscopy N/A 07/30/2015    Procedure: FLEXIBLE SIGMOIDOSCOPY;  Surgeon: Doran Stabler, MD;  Location: [facility] ENDOSCOPY;  Service: Endoscopy;  Laterality: N/A;  . Transanal excision of rectal mass N/A 08/03/2015    Procedure: TRANSANAL MINIMALLY INVASIVE SURGERY TO RESECT RECTAL POLYP;  Surgeon: [name], MD;  Location: [facility] ORS;  Service: General;  Laterality: N/A;  . Flexible sigmoidoscopy N/A 08/03/2015    Procedure:  FLEXIBLE SIGMOIDOSCOPY;  Surgeon: [name], MD;  Location: [facility] ORS;  Service: General;  Laterality: N/A;   Family History  Problem Relation Age of Onset  . Colon cancer Mother    Social History  Substance Use Topics  . Smoking status: Current Every Day Smoker -- 0.50 packs/day for 66 years    Types: Cigarettes  . Smokeless tobacco: Former Systems developer    Types: Snuff, Chew     Comment: 05/27/2013 "haven't used chew or snuff in ~ 30 yr"  . Alcohol Use: 0.0 oz/week    0 Standard drinks or equivalent per week     Comment: "I drink a fifth of bourbon a month" Hadn't had anything in 2 months    Review of  Systems  Constitutional: Negative for fever and chills.  Musculoskeletal: Positive for arthralgias.   Allergies  Review of patient's allergies indicates no known allergies.  Home Medications   Prior to Admission medications   Medication Sig Start Date End Date Taking? Authorizing Provider  albuterol (PROVENTIL HFA;VENTOLIN HFA) 108 (90 Base) MCG/ACT inhaler Inhale 2 puffs into the lungs every 4 (four) hours as needed for wheezing or shortness of breath. 08/07/15   Venetia Maxon Rama, MD  aspirin EC 81 MG tablet Take 81 mg by mouth daily.    Historical Provider, MD  carvedilol (COREG) 6.25 MG tablet Take 1 tablet (6.25 mg total) by mouth 2 (two) times daily with a meal. 09/23/15   Blanchie Dessert, MD  diphenoxylate-atropine (LOMOTIL) 2.5-0.025 MG tablet Take 1 tablet by mouth 4 (four) times daily as needed for diarrhea or loose stools. 09/25/15   Tanna Furry, MD  feeding supplement, ENSURE ENLIVE, (ENSURE ENLIVE) LIQD Take 237 mLs by mouth 2 (two) times daily between meals. 08/07/15   Venetia Maxon Rama, MD  oxyCODONE (OXY IR/ROXICODONE) 5 MG immediate release tablet Take 1 tablet (5 mg total) by mouth every 6 (six) hours as needed for severe pain. Patient not taking: Reported on 09/25/2015 08/07/15   Venetia Maxon Rama, MD  predniSONE (DELTASONE) 20 MG tablet Take 2 tablets (40 mg total) by mouth daily. Patient not taking: Reported on 09/24/2015 09/23/15   Blanchie Dessert, MD   BP 122/58 mmHg  Pulse 86  Temp(Src) 98.2 F (36.8 C) (Oral)  Resp 16  SpO2 97%    Physical Exam  Constitutional: He is oriented to person, place, and time. He appears well-developed and well-nourished.  HENT:  Head: Normocephalic and atraumatic.  Eyes: EOM are normal.  Neck: Normal range of motion.  Cardiovascular: Normal rate, regular rhythm, normal heart sounds and intact distal pulses.  Exam reveals no gallop and no friction rub.   No murmur heard. Pulmonary/Chest: Effort normal and breath sounds normal. No  respiratory distress. He has no wheezes. He has no rales. He exhibits no tenderness.  Abdominal: Soft.  Musculoskeletal: Normal range of motion.  FROM of hip; no TTP of right hip; pt ambulatory   Neurological: He is alert and oriented to person, place, and time. No cranial nerve deficit.  Neurovascularly intact  Skin: Skin is warm and dry.  Psychiatric: He has a normal mood and affect.  Nursing note and vitals reviewed.  ED Course  Procedures  DIAGNOSTIC STUDIES: Oxygen Saturation is 97% on RA, normal by my interpretation.    COORDINATION OF CARE: 2:01 PM Discussed next steps with pt. He verbalized understanding and is agreeable with the plan.   Labs Review Labs Reviewed - No data to display  Imaging Review No results  found.   EKG Interpretation None      MDM  I have reviewed the relevant previous healthcare records. I obtained HPI from historian.  ED Course:  Assessment: Pt is a 69yM who presents with for chronic right hip pain. Seen multiple times in ED for similar events. Has care plan in place. No new onset of symptoms On exam, pt in NAD. Nontoxic/nonseptic appearing. VSS. Afebrile. Full ROM of right hip. Non TTP. Neurovascularly intact. Pt able to ambulate. Plan is to [place]. No narcotics were given at this visit. At time of discharge, Patient is in no acute distress. Vital Signs are stable. Patient is able to ambulate. Patient able to tolerate PO.    Disposition/Plan:  DC Home Additional Verbal discharge instructions given and discussed with patient.  Pt Instructed to f/u with PCP in the next week for evaluation and treatment of symptoms. Return precautions given Pt acknowledges and agrees with plan  Supervising Physician Damon Belling, MD   Final diagnoses:  Chronic hip pain, right     I personally performed the services described in this documentation, which was scribed in my presence. The recorded information has been reviewed and is  accurate.    Damon Decamp, PA-C 10/20/15 1404  Damon Belling, MD 10/20/15 1525

## 2015-10-20 NOTE — ED Notes (Signed)
No answer in waiting room 

## 2015-10-21 ENCOUNTER — Emergency Department (HOSPITAL_COMMUNITY)
Admission: EM | Admit: 2015-10-21 | Discharge: 2015-10-21 | Disposition: A | Discharge status: Home / Self Care | Payer: Medicare Other | Source: Home / Self Care | Attending: Emergency Medicine | Admitting: Emergency Medicine

## 2015-10-21 ENCOUNTER — Emergency Department (HOSPITAL_COMMUNITY)
Admission: EM | Admit: 2015-10-21 | Discharge: 2015-10-21 | Disposition: A | Discharge status: Home / Self Care | Payer: Medicare Other

## 2015-10-21 ENCOUNTER — Encounter (HOSPITAL_COMMUNITY): Payer: Self-pay

## 2015-10-21 ENCOUNTER — Encounter (HOSPITAL_COMMUNITY): Payer: Self-pay | Admitting: Emergency Medicine

## 2015-10-21 ENCOUNTER — Other Ambulatory Visit: Payer: Self-pay

## 2015-10-21 DIAGNOSIS — Z8701 Personal history of pneumonia (recurrent): Secondary | ICD-10-CM | POA: Insufficient documentation

## 2015-10-21 DIAGNOSIS — Z86018 Personal history of other benign neoplasm: Secondary | ICD-10-CM

## 2015-10-21 DIAGNOSIS — R0789 Other chest pain: Secondary | ICD-10-CM | POA: Insufficient documentation

## 2015-10-21 DIAGNOSIS — I4891 Unspecified atrial fibrillation: Secondary | ICD-10-CM

## 2015-10-21 DIAGNOSIS — M109 Gout, unspecified: Secondary | ICD-10-CM | POA: Insufficient documentation

## 2015-10-21 DIAGNOSIS — G8929 Other chronic pain: Secondary | ICD-10-CM

## 2015-10-21 DIAGNOSIS — Z79899 Other long term (current) drug therapy: Secondary | ICD-10-CM | POA: Insufficient documentation

## 2015-10-21 DIAGNOSIS — Z9181 History of falling: Secondary | ICD-10-CM | POA: Insufficient documentation

## 2015-10-21 DIAGNOSIS — Z8719 Personal history of other diseases of the digestive system: Secondary | ICD-10-CM | POA: Insufficient documentation

## 2015-10-21 DIAGNOSIS — Z86010 Personal history of colonic polyps: Secondary | ICD-10-CM

## 2015-10-21 DIAGNOSIS — F17210 Nicotine dependence, cigarettes, uncomplicated: Secondary | ICD-10-CM

## 2015-10-21 DIAGNOSIS — Z950 Presence of cardiac pacemaker: Secondary | ICD-10-CM

## 2015-10-21 DIAGNOSIS — Z7982 Long term (current) use of aspirin: Secondary | ICD-10-CM

## 2015-10-21 DIAGNOSIS — Z8572 Personal history of non-Hodgkin lymphomas: Secondary | ICD-10-CM

## 2015-10-21 DIAGNOSIS — I25119 Atherosclerotic heart disease of native coronary artery with unspecified angina pectoris: Secondary | ICD-10-CM

## 2015-10-21 DIAGNOSIS — R062 Wheezing: Secondary | ICD-10-CM | POA: Insufficient documentation

## 2015-10-21 DIAGNOSIS — I252 Old myocardial infarction: Secondary | ICD-10-CM | POA: Insufficient documentation

## 2015-10-21 DIAGNOSIS — Z8659 Personal history of other mental and behavioral disorders: Secondary | ICD-10-CM | POA: Insufficient documentation

## 2015-10-21 DIAGNOSIS — Z8781 Personal history of (healed) traumatic fracture: Secondary | ICD-10-CM

## 2015-10-21 DIAGNOSIS — Z8639 Personal history of other endocrine, nutritional and metabolic disease: Secondary | ICD-10-CM

## 2015-10-21 DIAGNOSIS — Z87438 Personal history of other diseases of male genital organs: Secondary | ICD-10-CM

## 2015-10-21 DIAGNOSIS — Z8739 Personal history of other diseases of the musculoskeletal system and connective tissue: Secondary | ICD-10-CM | POA: Insufficient documentation

## 2015-10-21 DIAGNOSIS — I10 Essential (primary) hypertension: Secondary | ICD-10-CM | POA: Insufficient documentation

## 2015-10-21 DIAGNOSIS — R079 Chest pain, unspecified: Secondary | ICD-10-CM | POA: Diagnosis not present

## 2015-10-21 LAB — I-STAT TROPONIN, ED: Troponin i, poc: 0.02 ng/mL (ref 0.00–0.08)

## 2015-10-21 MED ORDER — ACETAMINOPHEN 500 MG PO TABS
1000.0000 mg | ORAL_TABLET | Freq: Once | ORAL | Status: AC
  Administered 2015-10-21: 1000 mg via ORAL
  Filled 2015-10-21: qty 2

## 2015-10-21 MED ORDER — IBUPROFEN 800 MG PO TABS
800.0000 mg | ORAL_TABLET | Freq: Once | ORAL | Status: AC
  Administered 2015-10-21: 800 mg via ORAL
  Filled 2015-10-21: qty 1

## 2015-10-21 NOTE — ED Notes (Signed)
Pt outside of emergency room entrance smoking.

## 2015-10-21 NOTE — ED Notes (Signed)
Upon entering room to DC pt, pt has pooped pants. There is feces on floor, on the stool, on the wall. Pt states that he didn't hit the call bell because he wouldn't have made it in time. Feces all throughout room on equipment, walls, floor.

## 2015-10-21 NOTE — ED Notes (Addendum)
[facility], c/o CP, 5th visit in 24 hours, is homeless, no acute changes noted

## 2015-10-21 NOTE — Discharge Instructions (Signed)
Nonspecific Chest Pain  °Chest pain can be caused by many different conditions. There is always a chance that your pain could be related to something serious, such as a heart attack or a blood clot in your lungs. Chest pain can also be caused by conditions that are not life-threatening. If you have chest pain, it is very important to follow up with your health care provider. °CAUSES  °Chest pain can be caused by: °· Heartburn. °· Pneumonia or bronchitis. °· Anxiety or stress. °· Inflammation around your heart (pericarditis) or lung (pleuritis or pleurisy). °· A blood clot in your lung. °· A collapsed lung (pneumothorax). It can develop suddenly on its own (spontaneous pneumothorax) or from trauma to the chest. °· Shingles infection (varicella-zoster virus). °· Heart attack. °· Damage to the bones, muscles, and cartilage that make up your chest wall. This can include: °¨ Bruised bones due to injury. °¨ Strained muscles or cartilage due to frequent or repeated coughing or overwork. °¨ Fracture to one or more ribs. °¨ Sore cartilage due to inflammation (costochondritis). °RISK FACTORS  °Risk factors for chest pain may include: °· Activities that increase your risk for trauma or injury to your chest. °· Respiratory infections or conditions that cause frequent coughing. °· Medical conditions or overeating that can cause heartburn. °· Heart disease or family history of heart disease. °· Conditions or health behaviors that increase your risk of developing a blood clot. °· Having had chicken pox (varicella zoster). °SIGNS AND SYMPTOMS °Chest pain can feel like: °· Burning or tingling on the surface of your chest or deep in your chest. °· Crushing, pressure, aching, or squeezing pain. °· Dull or sharp pain that is worse when you move, cough, or take a deep breath. °· Pain that is also felt in your back, neck, shoulder, or arm, or pain that spreads to any of these areas. °Your chest pain may come and go, or it may stay  constant. °DIAGNOSIS °Lab tests or other studies may be needed to find the cause of your pain. Your health care provider may have you take a test called an ambulatory ECG (electrocardiogram). An ECG records your heartbeat patterns at the time the test is performed. You may also have other tests, such as: °· Transthoracic echocardiogram (TTE). During echocardiography, sound waves are used to create a picture of all of the heart structures and to look at how blood flows through your heart. °· Transesophageal echocardiogram (TEE). This is a more advanced imaging test that obtains images from inside your body. It allows your health care provider to see your heart in finer detail. °· Cardiac monitoring. This allows your health care provider to monitor your heart rate and rhythm in real time. °· Holter monitor. This is a portable device that records your heartbeat and can help to diagnose abnormal heartbeats. It allows your health care provider to track your heart activity for several days, if needed. °· Stress tests. These can be done through exercise or by taking medicine that makes your heart beat more quickly. °· Blood tests. °· Imaging tests. °TREATMENT  °Your treatment depends on what is causing your chest pain. Treatment may include: °· Medicines. These may include: °¨ Acid blockers for heartburn. °¨ Anti-inflammatory medicine. °¨ Pain medicine for inflammatory conditions. °¨ Antibiotic medicine, if an infection is present. °¨ Medicines to dissolve blood clots. °¨ Medicines to treat coronary artery disease. °· Supportive care for conditions that do not require medicines. This may include: °¨ Resting. °¨ Applying heat   or cold packs to injured areas. °¨ Limiting activities until pain decreases. °HOME CARE INSTRUCTIONS °· If you were prescribed an antibiotic medicine, finish it all even if you start to feel better. °· Avoid any activities that bring on chest pain. °· Do not use any tobacco products, including  cigarettes, chewing tobacco, or electronic cigarettes. If you need help quitting, ask your health care provider. °· Do not drink alcohol. °· Take medicines only as directed by your health care provider. °· Keep all follow-up visits as directed by your health care provider. This is important. This includes any further testing if your chest pain does not go away. °· If heartburn is the cause for your chest pain, you may be told to keep your head raised (elevated) while sleeping. This reduces the chance that acid will go from your stomach into your esophagus. °· Make lifestyle changes as directed by your health care provider. These may include: °¨ Getting regular exercise. Ask your health care provider to suggest some activities that are safe for you. °¨ Eating a heart-healthy diet. A registered dietitian can help you to learn healthy eating options. °¨ Maintaining a healthy weight. °¨ Managing diabetes, if necessary. °¨ Reducing stress. °SEEK MEDICAL CARE IF: °· Your chest pain does not go away after treatment. °· You have a rash with blisters on your chest. °· You have a fever. °SEEK IMMEDIATE MEDICAL CARE IF:  °· Your chest pain is worse. °· You have an increasing cough, or you cough up blood. °· You have severe abdominal pain. °· You have severe weakness. °· You faint. °· You have chills. °· You have sudden, unexplained chest discomfort. °· You have sudden, unexplained discomfort in your arms, back, neck, or jaw. °· You have shortness of breath at any time. °· You suddenly start to sweat, or your skin gets clammy. °· You feel nauseous or you vomit. °· You suddenly feel light-headed or dizzy. °· Your heart begins to beat quickly, or it feels like it is skipping beats. °These symptoms may represent a serious problem that is an emergency. Do not wait to see if the symptoms will go away. Get medical help right away. Call your local emergency services (911 in the [place] Do not drive yourself to the hospital. °  °This  information is not intended to replace advice given to you by your health care provider. Make sure you discuss any questions you have with your health care provider. °  °Document Released: 03/06/2005 Document Revised: 06/17/2014 Document Reviewed: 12/31/2013 °Elsevier Interactive Patient Education ©2016 Elsevier Inc[employer] ° °

## 2015-10-21 NOTE — ED Provider Notes (Signed)
CSN: [id number]     Arrival date & time 10/21/15  2050 History   First MD Initiated Contact with Patient 10/21/15 2108     Chief Complaint  Patient presents with  . Chest Pain     (Consider location/radiation/quality/duration/timing/severity/associated sxs/prior Treatment) Patient is a 70 y.o. male presenting with chest pain. The history is provided by the patient.  Chest Pain Pain location:  L lateral chest Pain quality: sharp and shooting   Pain radiates to:  Does not radiate Pain severity:  Moderate Onset quality:  Gradual Duration:  12 months Timing:  Constant Progression:  Unchanged Chronicity:  Chronic Relieved by:  Nothing Worsened by:  Nothing tried Ineffective treatments:  None tried Associated symptoms: no abdominal pain, no fever, no headache, no palpitations, no shortness of breath and not vomiting    70 yo M With a chief complaint chest wall pain. Patient has been to the ED for the same multiple times. Feels like it's about the same. Thinks it started today. Worse with movement palpation. Denies radiation. Denies nausea or vomiting. Denies abdominal pain.  Past Medical History  Diagnosis Date  . Depression   . Fall at nursing home     December 2014  . History of migraine   . Gout attack   . Complete heart block ([place])     St. Jude pacemaker - followed by Dr. Lovena Le  . Chronic atrial fibrillation ([place])   . HLD (hyperlipidemia)   . GIB (gastrointestinal bleeding)   . BPH (benign prostatic hyperplasia)   . Essential hypertension   . Gastric ulcer   . Non Hodgkin's lymphoma ([place])   . Tubular adenoma of colon   . Aspiration pneumonia ([place]) 10/07/2014  . 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 Atypical angina ([place]) 12/01/2014  . BLOCK, AV, COMPLETE 12/24/2006    Annotation: Pacer placed Qualifier: Diagnosis of  By: Hilma Favors  DO, Beth    . 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 Fracture femur, cervicotrochanteric ([place]) 05/27/2013   Past Surgical History  Procedure Laterality Date  . Pacemaker generator change      Dr. Lovena Le  . Appendectomy    . Cholecystectomy    . Open reduction of hip Right 05/28/2013    Procedure: OPEN REDUCTION OF HIP;  Surgeon: Renette Butters, MD;  Location: [place];  Service: Orthopedics;  Laterality: Right;  . Esophagogastroduodenoscopy (egd) with propofol N/A 09/26/2014    Procedure: ESOPHAGOGASTRODUODENOSCOPY (EGD) WITH PROPOFOL;  Surgeon: Jerene Bears, MD;  Location[facility] ENDOSCOPY;  Service: Endoscopy;  Laterality: N/A;  . Colonoscopy N/A 10/01/2014    Procedure: COLONOSCOPY;  Surgeon: Jerene Bears, MD;  Location: [facility] ENDOSCOPY;  Service: Endoscopy;  Laterality: N/A;  . Partial colectomy N/A 10/05/2014    Procedure: SUBTOTAL COLECTOMY WITH ILEORECTAL ANASTOMOSIS;  Surgeon: Donnie Mesa, MD;  Location: [place];  Service: General;  Laterality: N/A;  . Flexible sigmoidoscopy N/A 11/07/2014    Procedure: FLEXIBLE SIGMOIDOSCOPY;  Surgeon: Jerene Bears, MD;  Location: [facility] ENDOSCOPY;  Service: Endoscopy;  Laterality: N/A;  . Gastric resection    . Flexible sigmoidoscopy N/A 07/30/2015    Procedure: FLEXIBLE SIGMOIDOSCOPY;  Surgeon: Doran Stabler, MD;  Location: [facility] ENDOSCOPY;  Service: Endoscopy;  Laterality: N/A;  . Transanal excision of rectal mass N/A 08/03/2015  Procedure: TRANSANAL MINIMALLY INVASIVE SURGERY TO RESECT RECTAL POLYP;  Surgeon: [name], MD;  Location: [facility]S;  Service: General;  Laterality: N/A;  . Flexible sigmoidoscopy N/A 08/03/2015    Procedure: FLEXIBLE SIGMOIDOSCOPY;  Surgeon: [name], MD;  Location: [facility] ORS;  Service: General;  Laterality: N/A;   Family History  Problem Relation Age of Onset  . Colon cancer Mother    Social History  Substance Use Topics  . Smoking status: Current Every Day Smoker -- 0.50 packs/day for 66 years    Types: Cigarettes  .  Smokeless tobacco: Former Systems developer    Types: Snuff, Chew     Comment: 05/27/2013 "haven't used chew or snuff in ~ 30 yr"  . Alcohol Use: 0.0 oz/week    0 Standard drinks or equivalent per week     Comment: licqor monthly    Review of Systems  Constitutional: Negative for fever and chills.  HENT: Negative for congestion and facial swelling.   Eyes: Negative for discharge and visual disturbance.  Respiratory: Negative for shortness of breath.   Cardiovascular: Positive for chest pain. Negative for palpitations.  Gastrointestinal: Negative for vomiting, abdominal pain and diarrhea.  Musculoskeletal: Negative for myalgias and arthralgias.  Skin: Negative for color change and rash.  Neurological: Negative for tremors, syncope and headaches.  Psychiatric/Behavioral: Negative for confusion and dysphoric mood.      Allergies  Review of patient's allergies indicates no known allergies.  Home Medications   Prior to Admission medications   Medication Sig Start Date End Date Taking? Authorizing Provider  albuterol (PROVENTIL HFA;VENTOLIN HFA) 108 (90 Base) MCG/ACT inhaler Inhale 2 puffs into the lungs every 4 (four) hours as needed for wheezing or shortness of breath. 08/07/15   Venetia Maxon Rama, MD  aspirin EC 81 MG tablet Take 81 mg by mouth daily.    Historical Provider, MD  carvedilol (COREG) 6.25 MG tablet Take 1 tablet (6.25 mg total) by mouth 2 (two) times daily with a meal. 09/23/15   Blanchie Dessert, MD  diphenoxylate-atropine (LOMOTIL) 2.5-0.025 MG tablet Take 1 tablet by mouth 4 (four) times daily as needed for diarrhea or loose stools. 09/25/15   Tanna Furry, MD  feeding supplement, ENSURE ENLIVE, (ENSURE ENLIVE) LIQD Take 237 mLs by mouth 2 (two) times daily between meals. 08/07/15   Venetia Maxon Rama, MD  oxyCODONE (OXY IR/ROXICODONE) 5 MG immediate release tablet Take 1 tablet (5 mg total) by mouth every 6 (six) hours as needed for severe pain. Patient not taking: Reported on 09/25/2015  08/07/15   Venetia Maxon Rama, MD  predniSONE (DELTASONE) 20 MG tablet Take 2 tablets (40 mg total) by mouth daily. Patient not taking: Reported on 09/24/2015 09/23/15   Blanchie Dessert, MD   There were no vitals taken for this visit. Physical Exam  Constitutional: He is oriented to person, place, and time. He appears well-developed and well-nourished.  HENT:  Head: Normocephalic and atraumatic.  Eyes: EOM are normal. Pupils are equal, round, and reactive to light.  Neck: Normal range of motion. Neck supple. No JVD present.  Cardiovascular: Normal rate and regular rhythm.  Exam reveals no gallop and no friction rub.   No murmur heard. Pulmonary/Chest: No respiratory distress. He has no wheezes. He exhibits tenderness (to the left lateral chest wall about rib 6).  Abdominal: He exhibits no distension. There is no rebound and no guarding.  Musculoskeletal: Normal range of motion.  Neurological: He is alert and oriented to person, place, and time.  Skin:  No rash noted. No pallor.  Psychiatric: He has a normal mood and affect. His behavior is normal.  Nursing note and vitals reviewed.   ED Course  Procedures (including critical care time) [place], ED    Imaging Review No results found. I have personally reviewed and evaluated these images and lab results as part of my medical decision-making.  ED ECG REPORT   Date: 10/21/2015  Rate: 115  Rhythm: atrial fibrillation  QRS Axis: normal  Intervals: normal  ST/T Wave abnormalities: nonspecific ST changes  Conduction Disutrbances:none  Narrative Interpretation:   Old EKG Reviewed: unchanged  I have personally reviewed the EKG tracing and agree with the computerized printout as noted.   MDM   Final diagnoses:  Chronic chest wall pain    70 yo M with a chief complaint of chest wall pain. Patient has been to the ED 44 times in the last 6 months. Many of these visits were for chest wall pain.  Upon tying the patient that I was going to check an EKG immediately told me that he would like something to eat. Will obtain EKG. The reason for further workup. Discharge home.  10:55 PM:  I have discussed the diagnosis/risks/treatment options with the patient and believe the pt to be eligible for discharge home to follow-up with PCP. We also discussed returning to the ED immediately if new or worsening sx occur. We discussed the sx which are most concerning (e.g., sudden worsening pain, fever, inability to tolerate by mouth) that necessitate immediate return. Medications administered to the patient during their visit and any new prescriptions provided to the patient are listed below.  Medications given during this visit Medications  acetaminophen (TYLENOL) tablet 1,000 mg (1,000 mg Oral Given 10/21/15 2146)  ibuprofen (ADVIL,MOTRIN) tablet 800 mg (800 mg Oral Given 10/21/15 2146)    Discharge Medication List as of 10/21/2015 10:41 PM      The patient appears reasonably screen and/or stabilized for discharge and I doubt any other medical condition or other [facility] requiring further screening, evaluation, or treatment in the ED at this time prior to discharge.      Deno Etienne, DO 10/21/15 2256

## 2015-10-21 NOTE — Discharge Instructions (Signed)

## 2015-10-21 NOTE — ED Provider Notes (Signed)
CSN: [id number]     Arrival date & time 10/21/15  0844 History   First MD Initiated Contact with Patient 10/21/15 [phone]     Chief Complaint  Patient presents with  . Chest Pain     (Consider location/radiation/quality/duration/timing/severity/associated sxs/prior Treatment) HPI Patient well-known to the emergency department. Was seen 4 times yesterday for various complaints. He complains of chest pain. States he is having ongoing left-sided chest pain worse with palpation and movement. It is unchanged from his previous presentations with the same. No new injury. He was escorted off the property this morning by security. No new shortness of breath or lower extremity swelling. States he is taking Darvocet at home for the chest pain.  Past Medical History  Diagnosis Date  . Depression   . Fall at nursing home     December 2014  . History of migraine   . Gout attack   . Complete heart block ([place])     St. Jude pacemaker - followed by Dr. Lovena Le  . Chronic atrial fibrillation ([place])   . HLD (hyperlipidemia)   . GIB (gastrointestinal bleeding)   . BPH (benign prostatic hyperplasia)   . Essential hypertension   . Gastric ulcer   . Non Hodgkin's lymphoma ([place])   . Tubular adenoma of colon   . Aspiration pneumonia ([place]) 10/07/2014  . 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 Atypical angina ([place]) 12/01/2014  . BLOCK, AV, COMPLETE 12/24/2006    Annotation: Pacer placed Qualifier: Diagnosis of  By: Hilma Favors  DO, Beth    . 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 Fracture femur, cervicotrochanteric ([place]) 05/27/2013   Past Surgical History  Procedure Laterality Date  . Pacemaker generator change      Dr. Lovena Le  . Appendectomy    . Cholecystectomy    . Open reduction of hip Right 05/28/2013    Procedure: OPEN REDUCTION OF HIP;  Surgeon: Renette Butters,  MD;  Location: [place];  Service: Orthopedics;  Laterality: Right;  . Esophagogastroduodenoscopy (egd) with propofol N/A 09/26/2014    Procedure: ESOPHAGOGASTRODUODENOSCOPY (EGD) WITH PROPOFOL;  Surgeon: Jerene Bears, MD;  Location: [facility] ENDOSCOPY;  Service: Endoscopy;  Laterality: N/A;  . Colonoscopy N/A 10/01/2014    Procedure: COLONOSCOPY;  Surgeon: Jerene Bears, MD;  Location: [facility] ENDOSCOPY;  Service: Endoscopy;  Laterality: N/A;  . Partial colectomy N/A 10/05/2014    Procedure: SUBTOTAL COLECTOMY WITH ILEORECTAL ANASTOMOSIS;  Surgeon: Donnie Mesa, MD;  Location: [place];  Service: General;  Laterality: N/A;  . Flexible sigmoidoscopy N/A 11/07/2014    Procedure: FLEXIBLE SIGMOIDOSCOPY;  Surgeon: Jerene Bears, MD;  Location: [facility] ENDOSCOPY;  Service: Endoscopy;  Laterality: N/A;  . Gastric resection    . Flexible sigmoidoscopy N/A 07/30/2015    Procedure: FLEXIBLE SIGMOIDOSCOPY;  Surgeon: Doran Stabler, MD;  Location: [facility] ENDOSCOPY;  Service: Endoscopy;  Laterality: N/A;  . Transanal excision of rectal mass N/A 08/03/2015    Procedure: TRANSANAL MINIMALLY INVASIVE SURGERY TO RESECT RECTAL POLYP;  Surgeon: [name], MD;  Location: [facility] ORS;  Service: General;  Laterality: N/A;  . Flexible sigmoidoscopy N/A 08/03/2015    Procedure: FLEXIBLE SIGMOIDOSCOPY;  Surgeon: [name], MD;  Location: [facility] ORS;  Service: General;  Laterality: N/A;   Family History  Problem Relation Age of Onset  . Colon  cancer Mother    Social History  Substance Use Topics  . Smoking status: Current Every Day Smoker -- 0.50 packs/day for 66 years    Types: Cigarettes  . Smokeless tobacco: Former Systems developer    Types: Snuff, Chew     Comment: 05/27/2013 "haven't used chew or snuff in ~ 30 yr"  . Alcohol Use: 0.0 oz/week    0 Standard drinks or equivalent per week     Comment: "I drink a fifth of bourbon a month" Hadn't had anything in 2 months    Review of Systems  Constitutional: Negative for fever and chills.   Respiratory: Negative for cough and shortness of breath.   Cardiovascular: Positive for chest pain. Negative for palpitations and leg swelling.  Gastrointestinal: Negative for nausea, vomiting and abdominal pain.  Musculoskeletal: Positive for myalgias.  Skin: Negative for rash and wound.  Neurological: Negative for dizziness, weakness, light-headedness and headaches.  All other systems reviewed and are negative.     Allergies  Review of patient's allergies indicates no known allergies.  Home Medications   Prior to Admission medications   Medication Sig Start Date End Date Taking? Authorizing Provider  albuterol (PROVENTIL HFA;VENTOLIN HFA) 108 (90 Base) MCG/ACT inhaler Inhale 2 puffs into the lungs every 4 (four) hours as needed for wheezing or shortness of breath. 08/07/15   Venetia Maxon Rama, MD  aspirin EC 81 MG tablet Take 81 mg by mouth daily.    Historical Provider, MD  carvedilol (COREG) 6.25 MG tablet Take 1 tablet (6.25 mg total) by mouth 2 (two) times daily with a meal. 09/23/15   Blanchie Dessert, MD  diphenoxylate-atropine (LOMOTIL) 2.5-0.025 MG tablet Take 1 tablet by mouth 4 (four) times daily as needed for diarrhea or loose stools. 09/25/15   Tanna Furry, MD  feeding supplement, ENSURE ENLIVE, (ENSURE ENLIVE) LIQD Take 237 mLs by mouth 2 (two) times daily between meals. 08/07/15   Venetia Maxon Rama, MD  oxyCODONE (OXY IR/ROXICODONE) 5 MG immediate release tablet Take 1 tablet (5 mg total) by mouth every 6 (six) hours as needed for severe pain. Patient not taking: Reported on 09/25/2015 08/07/15   Venetia Maxon Rama, MD  predniSONE (DELTASONE) 20 MG tablet Take 2 tablets (40 mg total) by mouth daily. Patient not taking: Reported on 09/24/2015 09/23/15   Blanchie Dessert, MD   BP 130/68 mmHg  Pulse 88  Temp(Src) 97.4 F (36.3 C) (Oral)  Resp 15  SpO2 99% Physical Exam  Constitutional: He is oriented to person, place, and time. He appears well-developed and well-nourished. No  distress.  Disheveled. Sleeping.  HENT:  Head: Normocephalic and atraumatic.  Mouth/Throat: Oropharynx is clear and moist.  Multiple healing abrasions  Eyes: EOM are normal. Pupils are equal, round, and reactive to light.  Neck: Normal range of motion. Neck supple.  No posterior midline cervical tenderness to palpation.  Cardiovascular: Normal rate and regular rhythm.   Pulmonary/Chest: Effort normal. No respiratory distress. He has wheezes (few scattered Wheezes). He has no rales. He exhibits tenderness (Chest pain is completely reproduced with palpation over the left chest. There is no crepitance or deformity. no evidence of injury.).  Abdominal: Soft. Bowel sounds are normal. He exhibits no distension and no mass. There is no tenderness. There is no rebound and no guarding.  Musculoskeletal: Normal range of motion. He exhibits no edema or tenderness.  No lower extremity swelling, asymmetry or tenderness.  Neurological: He is alert and oriented to person, place, and time.  Moves all  extremities without deficit. Sensation is fully intact.  Skin: Skin is warm and dry. No rash noted. No erythema.  Psychiatric: He has a normal mood and affect. His behavior is normal.  Nursing note and vitals reviewed.   ED Course  Procedures (including critical care time) Labs Review Labs Reviewed - No data to display  Imaging Review No results found. I have personally reviewed and evaluated these images and lab results as part of my medical decision-making.   EKG Interpretation   Date/Time:  Saturday Oct 21 2015 08:45:48 EDT Ventricular Rate:  74 PR Interval:    QRS Duration: 100 QT Interval:  356 QTC Calculation: 395 R Axis:   -177 Text Interpretation:  Atrial fibrillation Anterolateral infarct, old  Confirmed by Lita Mains  MD, Suleman Gunning ([id number]) on 10/21/2015 9:50:51 AM      MDM   Final diagnoses:  Chronic chest wall pain    EKG is unchanged from previous. Patient's chest pain is  completely reproduced with palpation. Low suspicion for coronary artery disease. He's been screened multiple times in the past. He's been advised to follow-up with cardiology. Do not believe further workup is necessary at this point.    Julianne Rice, MD 10/21/15 [phone]

## 2015-10-21 NOTE — ED Notes (Signed)
To triage via EMS.  C/o chest pain and shortness of breath all day and right hip pain (pt reports getting hip replacement soon).  EMS gave ASA 324 mg and NTG x 1, decreased pain from 10/10 to 7/10.

## 2015-10-21 NOTE — ED Provider Notes (Signed)
CSN: [id number]     Arrival date & time 10/20/15  2330 History  By signing my name below, I, Irene Pap, attest that this documentation has been prepared under the direction and in the presence of Leo Grosser, MD. Electronically Signed: Irene Pap, ED Scribe. 10/21/2015. 12:52 AM.   Chief Complaint  Patient presents with  . Chest Pain   Patient is a 70 y.o. male presenting with chest pain. The history is provided by the patient. No language interpreter was used.  Chest Pain Pain location:  Substernal area Pain radiates to the back: no   Pain severity:  Mild Onset quality:  Gradual Timing:  Constant Progression:  Worsening Chronicity:  Chronic Context: at rest   Ineffective treatments:  None tried Associated symptoms: no fever and no shortness of breath   Risk factors: aortic disease, hypertension and male sex   HPI Comments: DONATHAN TOMER is a 70 y.o. Male with a hx of multiple ED visits, complete heart block with St. Jude pacemaker, chronic A-Fib, non-Hodgkin's lymphoma, BPH, HTN, and cardiomyopathy who presents to the Emergency Department complaining of worsening of his chronic chest pain. Pt states that this pain is the same as his chronic chest pain and "his defib firing." Pt was seen earlier today in the ED for hip pain. Pt is requesting a [place] sandwich and a drink. Pt denies SOB, fever, or chills.  Past Medical History  Diagnosis Date  . Depression   . Fall at nursing home     December 2014  . History of migraine   . Gout attack   . Complete heart block ([place])     St. Jude pacemaker - followed by Dr. Lovena Le  . Chronic atrial fibrillation ([place])   . HLD (hyperlipidemia)   . GIB (gastrointestinal bleeding)   . BPH (benign prostatic hyperplasia)   . Essential hypertension   . Gastric ulcer   . Non Hodgkin's lymphoma ([place])   . Tubular adenoma of colon   . Aspiration pneumonia ([place]) 10/07/2014  . ATHEROSCLEROSIS, AORTIC 12/24/2006    Qualifier: Diagnosis of  By:  Hilma Favors  DO, Beth    . Atypical angina ([place]) 12/01/2014  . BLOCK, AV, COMPLETE 12/24/2006    Annotation: Pacer placed Qualifier: Diagnosis of  By: Hilma Favors  DO, Beth    . 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 Fracture femur, cervicotrochanteric ([place]) 05/27/2013   Past Surgical History  Procedure Laterality Date  . Pacemaker generator change      Dr. Lovena Le  . Appendectomy    . Cholecystectomy    . Open reduction of hip Right 05/28/2013    Procedure: OPEN REDUCTION OF HIP;  Surgeon: Renette Butters, MD;  Location: [place];  Service: Orthopedics;  Laterality: Right;  . Esophagogastroduodenoscopy (egd) with propofol N/A 09/26/2014    Procedure: ESOPHAGOGASTRODUODENOSCOPY (EGD) WITH PROPOFOL;  Surgeon: Jerene Bears, MD;  Location: [facility] ENDOSCOPY;  Service: Endoscopy;  Laterality: N/A;  . Colonoscopy N/A 10/01/2014    Procedure: COLONOSCOPY;  Surgeon: Jerene Bears, MD;  Location: [facility] ENDOSCOPY;  Service: Endoscopy;  Laterality: N/A;  . Partial colectomy N/A 10/05/2014    Procedure: SUBTOTAL COLECTOMY WITH ILEORECTAL ANASTOMOSIS;  Surgeon: Donnie Mesa, MD;  Location: [place];  Service: General;  Laterality: N/A;  . Flexible sigmoidoscopy N/A 11/07/2014    Procedure: FLEXIBLE SIGMOIDOSCOPY;  Surgeon: Jerene Bears, MD;  Location: [place] ENDOSCOPY;  Service: Endoscopy;  Laterality: N/A;  . Gastric resection    . Flexible sigmoidoscopy N/A 07/30/2015    Procedure: FLEXIBLE SIGMOIDOSCOPY;  Surgeon: Doran Stabler, MD;  Location: [facility] ENDOSCOPY;  Service: Endoscopy;  Laterality: N/A;  . Transanal excision of rectal mass N/A 08/03/2015    Procedure: TRANSANAL MINIMALLY INVASIVE SURGERY TO RESECT RECTAL POLYP;  Surgeon: [name], MD;  Location: [facility];  Service: General;  Laterality: N/A;  . Flexible sigmoidoscopy N/A 08/03/2015    Procedure: FLEXIBLE SIGMOIDOSCOPY;  Surgeon: [name], MD;  Location: [facility] ORS;   Service: General;  Laterality: N/A;   Family History  Problem Relation Age of Onset  . Colon cancer Mother    Social History  Substance Use Topics  . Smoking status: Current Every Day Smoker -- 0.50 packs/day for 66 years    Types: Cigarettes  . Smokeless tobacco: Former Systems developer    Types: Snuff, Chew     Comment: 05/27/2013 "haven't used chew or snuff in ~ 30 yr"  . Alcohol Use: 0.0 oz/week    0 Standard drinks or equivalent per week     Comment: "I drink a fifth of bourbon a month" Hadn't had anything in 2 months    Review of Systems  Constitutional: Negative for fever and chills.  Respiratory: Negative for shortness of breath.   Cardiovascular: Positive for chest pain.  All other systems reviewed and are negative.  Allergies  Review of patient's allergies indicates no known allergies.  Home Medications   Prior to Admission medications   Medication Sig Start Date End Date Taking? Authorizing Provider  albuterol (PROVENTIL HFA;VENTOLIN HFA) 108 (90 Base) MCG/ACT inhaler Inhale 2 puffs into the lungs every 4 (four) hours as needed for wheezing or shortness of breath. 08/07/15   Venetia Maxon Rama, MD  aspirin EC 81 MG tablet Take 81 mg by mouth daily.    Historical Provider, MD  carvedilol (COREG) 6.25 MG tablet Take 1 tablet (6.25 mg total) by mouth 2 (two) times daily with a meal. 09/23/15   Blanchie Dessert, MD  diphenoxylate-atropine (LOMOTIL) 2.5-0.025 MG tablet Take 1 tablet by mouth 4 (four) times daily as needed for diarrhea or loose stools. 09/25/15   Tanna Furry, MD  feeding supplement, ENSURE ENLIVE, (ENSURE ENLIVE) LIQD Take 237 mLs by mouth 2 (two) times daily between meals. 08/07/15   Venetia Maxon Rama, MD  oxyCODONE (OXY IR/ROXICODONE) 5 MG immediate release tablet Take 1 tablet (5 mg total) by mouth every 6 (six) hours as needed for severe pain. Patient not taking: Reported on 09/25/2015 08/07/15   Venetia Maxon Rama, MD  predniSONE (DELTASONE) 20 MG tablet Take 2 tablets (40 mg  total) by mouth daily. Patient not taking: Reported on 09/24/2015 09/23/15   Blanchie Dessert, MD   BP 150/79 mmHg  Pulse 94  Temp(Src) 97.6 F (36.4 C) (Oral)  Resp 20  SpO2 96% Physical Exam  Constitutional: He is oriented to person, place, and time. He appears well-developed and well-nourished.  HENT:  Head: Normocephalic and atraumatic.  Eyes: Conjunctivae and EOM are normal. Pupils are equal, round, and reactive to light.  Neck: Normal range of motion. Neck supple.  Cardiovascular: Normal rate, regular rhythm, normal heart sounds and intact distal pulses.  Exam reveals no gallop and no friction rub.   No murmur heard. Pulmonary/Chest: Effort normal and breath sounds normal. He has no wheezes.  Abdominal: Soft. There is no tenderness.  Musculoskeletal: Normal range of motion. He exhibits no edema.  Neurological: He is alert and oriented to person, place, and time.  Skin: Skin is warm and dry.  Psychiatric: He has a normal mood and affect. His behavior is normal.  Nursing note and vitals reviewed.   ED Course  Procedures (including critical care time) DIAGNOSTIC STUDIES: Oxygen Saturation is 96% on RA, normal by my interpretation.    COORDINATION OF CARE: 12:40 AM-Discussed treatment plan which includes follow up with PCP with pt at bedside and pt agreed to plan.    Labs Review Labs Reviewed - No data to display  Imaging Review No results found. I have personally reviewed and evaluated these images and lab results as part of my medical decision-making.   EKG Interpretation None      MDM   Final diagnoses:  Chronic chest pain  Chronic hip pain, unspecified laterality  Malingering   70 y.o. male presents with recurrent chronic pain complaints. When confronted about multiple serial visits in the last 2 days he admits he does not have a place to go and he is hungry which prompted him to check in again. Provided [place] sandwich and drink and discharged.   I  personally performed the services described in this documentation, which was scribed in my presence. The recorded information has been reviewed and is accurate.     Leo Grosser, MD 10/21/15 [phone]

## 2015-10-21 NOTE — ED Notes (Signed)
Pt verbalized understanding of d/c instructions and has no further questions. Pt stable and NAD.  

## 2015-10-21 NOTE — Discharge Instructions (Signed)
Chest Wall Pain Chest wall pain is pain in or around the bones and muscles of your chest. Sometimes, an injury causes this pain. Sometimes, the cause may not be known. This pain may take several weeks or longer to get better. HOME CARE INSTRUCTIONS  Pay attention to any changes in your symptoms. Take these actions to help with your pain:   Rest as told by your health care provider.   Avoid activities that cause pain. These include any activities that use your chest muscles or your abdominal and side muscles to lift heavy items.   If directed, apply ice to the painful area:  Put ice in a plastic bag.  Place a towel between your skin and the bag.  Leave the ice on for 20 minutes, 2-3 times per day.  Take over-the-counter and prescription medicines only as told by your health care provider.  Do not use tobacco products, including cigarettes, chewing tobacco, and e-cigarettes. If you need help quitting, ask your health care provider.  Keep all follow-up visits as told by your health care provider. This is important. SEEK MEDICAL CARE IF:  You have a fever.  Your chest pain becomes worse.  You have new symptoms. SEEK IMMEDIATE MEDICAL CARE IF:  You have nausea or vomiting.  You feel sweaty or light-headed.  You have a cough with phlegm (sputum) or you cough up blood.  You develop shortness of breath.   This information is not intended to replace advice given to you by your health care provider. Make sure you discuss any questions you have with your health care provider.   Document Released: 05/27/2005 Document Revised: 02/15/2015 Document Reviewed: 08/22/2014 Elsevier Interactive Patient Education 2016 Elsevier Inc.    Chronic Pain Chronic pain can be defined as pain that is off and on and lasts for 3-6 months or longer. Many things cause chronic pain, which can make it difficult to make a diagnosis. There are many treatment options available for chronic pain. However,  finding a treatment that works well for you may require trying various approaches until the right one is found. Many people benefit from a combination of two or more types of treatment to control their pain. SYMPTOMS  Chronic pain can occur anywhere in the body and can range from mild to very severe. Some types of chronic pain include:  Headache.  Low back pain.  Cancer pain.  Arthritis pain.  Neurogenic pain. This is pain resulting from damage to nerves. People with chronic pain may also have other symptoms such as:  Depression.  Anger.  Insomnia.  Anxiety. DIAGNOSIS  Your health care provider will help diagnose your condition over time. In many cases, the initial focus will be on excluding possible conditions that could be causing the pain. Depending on your symptoms, your health care provider may order tests to diagnose your condition. Some of these tests may include:   Blood tests.   CT scan.   MRI.   X-rays.   Ultrasounds.   Nerve conduction studies.  You may need to see a specialist.  TREATMENT  Finding treatment that works well may take time. You may be referred to a pain specialist. He or she may prescribe medicine or therapies, such as:   Mindful meditation or yoga.  Shots (injections) of numbing or pain-relieving medicines into the spine or area of pain.  Local electrical stimulation.  Acupuncture.   Massage therapy.   Aroma, color, light, or sound therapy.   Biofeedback.   Working  with a physical therapist to keep from getting stiff.   Regular, gentle exercise.   Cognitive or behavioral therapy.   Group support.  Sometimes, surgery may be recommended.  HOME CARE INSTRUCTIONS   Take all medicines as directed by your health care provider.   Lessen stress in your life by relaxing and doing things such as listening to calming music.   Exercise or be active as directed by your health care provider.   Eat a healthy diet and  include things such as vegetables, fruits, fish, and lean meats in your diet.   Keep all follow-up appointments with your health care provider.   Attend a support group with others suffering from chronic pain. SEEK MEDICAL CARE IF:   Your pain gets worse.   You develop a new pain that was not there before.   You cannot tolerate medicines given to you by your health care provider.   You have new symptoms since your last visit with your health care provider.  SEEK IMMEDIATE MEDICAL CARE IF:   You feel weak.   You have decreased sensation or numbness.   You lose control of bowel or bladder function.   Your pain suddenly gets much worse.   You develop shaking.  You develop chills.  You develop confusion.  You develop chest pain.  You develop shortness of breath.  MAKE SURE YOU:  Understand these instructions.  Will watch your condition.  Will get help right away if you are not doing well or get worse.   This information is not intended to replace advice given to you by your health care provider. Make sure you discuss any questions you have with your health care provider.   Document Released: 02/16/2002 Document Revised: 01/27/2013 Document Reviewed: 11/20/2012 Elsevier Interactive Patient Education [employer].

## 2015-10-21 NOTE — ED Notes (Signed)
Called X1 for pt to bring back to room. Pt smoking

## 2015-10-21 NOTE — ED Notes (Signed)
Pt found in bathroom smoking. Pt told he is not allowed to smoke. Pt then transported to xray.

## 2015-10-22 ENCOUNTER — Emergency Department (HOSPITAL_COMMUNITY): Payer: Medicare Other

## 2015-10-22 ENCOUNTER — Other Ambulatory Visit: Payer: Self-pay

## 2015-10-22 ENCOUNTER — Encounter (HOSPITAL_COMMUNITY): Payer: Self-pay | Admitting: Emergency Medicine

## 2015-10-22 ENCOUNTER — Emergency Department (HOSPITAL_COMMUNITY)
Admission: EM | Admit: 2015-10-22 | Discharge: 2015-10-22 | Disposition: A | Discharge status: Home / Self Care | Payer: Medicare Other | Attending: Emergency Medicine | Admitting: Emergency Medicine

## 2015-10-22 ENCOUNTER — Emergency Department (HOSPITAL_COMMUNITY)
Admission: EM | Admit: 2015-10-22 | Discharge: 2015-10-22 | Disposition: A | Discharge status: Home / Self Care | Payer: Medicare Other | Source: Home / Self Care | Attending: Emergency Medicine | Admitting: Emergency Medicine

## 2015-10-22 DIAGNOSIS — Z7982 Long term (current) use of aspirin: Secondary | ICD-10-CM

## 2015-10-22 DIAGNOSIS — Z8781 Personal history of (healed) traumatic fracture: Secondary | ICD-10-CM | POA: Insufficient documentation

## 2015-10-22 DIAGNOSIS — F17210 Nicotine dependence, cigarettes, uncomplicated: Secondary | ICD-10-CM | POA: Insufficient documentation

## 2015-10-22 DIAGNOSIS — Z8719 Personal history of other diseases of the digestive system: Secondary | ICD-10-CM | POA: Insufficient documentation

## 2015-10-22 DIAGNOSIS — R079 Chest pain, unspecified: Secondary | ICD-10-CM

## 2015-10-22 DIAGNOSIS — Z8572 Personal history of non-Hodgkin lymphomas: Secondary | ICD-10-CM

## 2015-10-22 DIAGNOSIS — Z8659 Personal history of other mental and behavioral disorders: Secondary | ICD-10-CM | POA: Diagnosis not present

## 2015-10-22 DIAGNOSIS — Z8639 Personal history of other endocrine, nutritional and metabolic disease: Secondary | ICD-10-CM

## 2015-10-22 DIAGNOSIS — Z87438 Personal history of other diseases of male genital organs: Secondary | ICD-10-CM | POA: Insufficient documentation

## 2015-10-22 DIAGNOSIS — I10 Essential (primary) hypertension: Secondary | ICD-10-CM | POA: Insufficient documentation

## 2015-10-22 DIAGNOSIS — I482 Chronic atrial fibrillation: Secondary | ICD-10-CM | POA: Insufficient documentation

## 2015-10-22 DIAGNOSIS — R0789 Other chest pain: Secondary | ICD-10-CM | POA: Insufficient documentation

## 2015-10-22 DIAGNOSIS — Z86010 Personal history of colonic polyps: Secondary | ICD-10-CM | POA: Diagnosis not present

## 2015-10-22 DIAGNOSIS — Z950 Presence of cardiac pacemaker: Secondary | ICD-10-CM

## 2015-10-22 DIAGNOSIS — I208 Other forms of angina pectoris: Secondary | ICD-10-CM

## 2015-10-22 DIAGNOSIS — Z85038 Personal history of other malignant neoplasm of large intestine: Secondary | ICD-10-CM | POA: Insufficient documentation

## 2015-10-22 DIAGNOSIS — Z8739 Personal history of other diseases of the musculoskeletal system and connective tissue: Secondary | ICD-10-CM | POA: Insufficient documentation

## 2015-10-22 DIAGNOSIS — Z79899 Other long term (current) drug therapy: Secondary | ICD-10-CM | POA: Insufficient documentation

## 2015-10-22 DIAGNOSIS — Z8701 Personal history of pneumonia (recurrent): Secondary | ICD-10-CM | POA: Insufficient documentation

## 2015-10-22 LAB — I-STAT TROPONIN, ED: Troponin i, poc: 0.02 ng/mL (ref 0.00–0.08)

## 2015-10-22 NOTE — ED Notes (Signed)
Per EMS - pt reports CP starting yesterday after d/c from [facility]. Pt apparently had pacemaker reset yesterday after malfunctioning. Left-sided CP, sharp pain. Denies any other associated symptoms  324mg  aspirin, 1 nitro.

## 2015-10-22 NOTE — ED Notes (Signed)
Pt in doorway of room with hands down his pants rubbing on genital area. Pt directed into his room.

## 2015-10-22 NOTE — ED Provider Notes (Signed)
CSN: [id number]     Arrival date & time 10/22/15  0830 History   First MD Initiated Contact with Patient 10/22/15 [phone]     Chief Complaint  Patient presents with  . Chest Pain     (Consider location/radiation/quality/duration/timing/severity/associated sxs/prior Treatment) HPI   70 year old male with chest pain. Chronic utilizer the emergency room. Has a past history of complete heart block, alcohol induced cardiomyopathy, chronic A. fib status post pacemaker. He frequent complaints of chest pain. He called EMS from the streets as he is homeless. He cannot tell me what is different specifically about his pain today. Shortness of breath. Nonproductive cough. No fevers or chills. No unusual leg pain or swelling. Past Medical History  Diagnosis Date  . Depression   . Fall at nursing home     December 2014  . History of migraine   . Gout attack   . Complete heart block ([place])     St. Jude pacemaker - followed by Dr. Lovena Le  . Chronic atrial fibrillation ([place])   . HLD (hyperlipidemia)   . GIB (gastrointestinal bleeding)   . BPH (benign prostatic hyperplasia)   . Essential hypertension   . Gastric ulcer   . Non Hodgkin's lymphoma ([place])   . Tubular adenoma of colon   . Aspiration pneumonia ([place]) 10/07/2014  . 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 Atypical angina ([place]) 12/01/2014  . BLOCK, AV, COMPLETE 12/24/2006    Annotation: Pacer placed Qualifier: Diagnosis of  By: Hilma Favors  DO, Beth    . 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 Fracture femur, cervicotrochanteric ([place]) 05/27/2013   Past Surgical History  Procedure Laterality Date  . Pacemaker generator change      Dr. Lovena Le  . Appendectomy    . Cholecystectomy    . Open reduction of hip Right 05/28/2013    Procedure: OPEN REDUCTION OF HIP;  Surgeon: Renette Butters, MD;  Location: [place];   Service: Orthopedics;  Laterality: Right;  . Esophagogastroduodenoscopy (egd) with propofol N/A 09/26/2014    Procedure: ESOPHAGOGASTRODUODENOSCOPY (EGD) WITH PROPOFOL;  Surgeon: Jerene Bears, MD;  [facility] ENDOSCOPY;  Service: Endoscopy;  Laterality: N/A;  . Colonoscopy N/A 10/01/2014    Procedure: COLONOSCOPY;  Surgeon: Jerene Bears, MD;  Location: [facility] ENDOSCOPY;  Service: Endoscopy;  Laterality: N/A;  . Partial colectomy N/A 10/05/2014    Procedure: SUBTOTAL COLECTOMY WITH ILEORECTAL ANASTOMOSIS;  Surgeon: Donnie Mesa, MD;  Location: [place];  Service: General;  Laterality: N/A;  . Flexible sigmoidoscopy N/A 11/07/2014    Procedure: FLEXIBLE SIGMOIDOSCOPY;  Surgeon: Jerene Bears, MD;  Location: [facility] ENDOSCOPY;  Service: Endoscopy;  Laterality: N/A;  . Gastric resection    . Flexible sigmoidoscopy N/A 07/30/2015    Procedure: FLEXIBLE SIGMOIDOSCOPY;  Surgeon: Doran Stabler, MD;  Location: [facility] ENDOSCOPY;  Service: Endoscopy;  Laterality: N/A;  . Transanal excision of rectal mass N/A 08/03/2015    Procedure: TRANSANAL MINIMALLY INVASIVE SURGERY TO RESECT RECTAL POLYP;  Surgeon: [name], MD;  Location: [facility] ORS;  Service: General;  Laterality: N/A;  . Flexible sigmoidoscopy N/A 08/03/2015    Procedure: FLEXIBLE SIGMOIDOSCOPY;  Surgeon: [name], MD;  Location: [facility] ORS;  Service: General;  Laterality: N/A;   Family History  Problem Relation Age of Onset  . Colon cancer Mother  Social History  Substance Use Topics  . Smoking status: Current Every Day Smoker -- 0.50 packs/day for 66 years    Types: Cigarettes  . Smokeless tobacco: Former Systems developer    Types: Snuff, Chew     Comment: 05/27/2013 "haven't used chew or snuff in ~ 30 yr"  . Alcohol Use: 0.0 oz/week    0 Standard drinks or equivalent per week     Comment: licqor monthly    Review of Systems  All systems reviewed and negative, other than as noted in HPI.   Allergies  Review of patient's allergies indicates no known  allergies.  Home Medications   Prior to Admission medications   Medication Sig Start Date End Date Taking? Authorizing Provider  albuterol (PROVENTIL HFA;VENTOLIN HFA) 108 (90 Base) MCG/ACT inhaler Inhale 2 puffs into the lungs every 4 (four) hours as needed for wheezing or shortness of breath. 08/07/15   Venetia Maxon Rama, MD  aspirin EC 81 MG tablet Take 81 mg by mouth daily.    Historical Provider, MD  carvedilol (COREG) 6.25 MG tablet Take 1 tablet (6.25 mg total) by mouth 2 (two) times daily with a meal. 09/23/15   Blanchie Dessert, MD  diphenoxylate-atropine (LOMOTIL) 2.5-0.025 MG tablet Take 1 tablet by mouth 4 (four) times daily as needed for diarrhea or loose stools. 09/25/15   Tanna Furry, MD  feeding supplement, ENSURE ENLIVE, (ENSURE ENLIVE) LIQD Take 237 mLs by mouth 2 (two) times daily between meals. 08/07/15   Venetia Maxon Rama, MD  oxyCODONE (OXY IR/ROXICODONE) 5 MG immediate release tablet Take 1 tablet (5 mg total) by mouth every 6 (six) hours as needed for severe pain. Patient not taking: Reported on 09/25/2015 08/07/15   Venetia Maxon Rama, MD  predniSONE (DELTASONE) 20 MG tablet Take 2 tablets (40 mg total) by mouth daily. Patient not taking: Reported on 09/24/2015 09/23/15   Blanchie Dessert, MD   SpO2 98% Physical Exam  Constitutional: He appears well-developed and well-nourished. No distress.  Laying in bed. Has disheveled appearance. No acute distress.  HENT:  Head: Normocephalic and atraumatic.  Eyes: Conjunctivae are normal. Right eye exhibits no discharge. Left eye exhibits no discharge.  Neck: Neck supple.  Cardiovascular: Regular rhythm and normal heart sounds.  Exam reveals no gallop and no friction rub.   No murmur heard. Mildly tachycardic  Pulmonary/Chest: Effort normal and breath sounds normal. No respiratory distress. He exhibits no tenderness.  Abdominal: Soft. He exhibits no distension. There is no tenderness.  Musculoskeletal: He exhibits no edema or tenderness.   Lower extremities symmetric as compared to each other. No calf tenderness. Negative Homan's. No palpable cords.   Neurological: He is alert.  Skin: Skin is warm and dry.  Psychiatric: He has a normal mood and affect. His behavior is normal. Thought content normal.  Nursing note and vitals reviewed.   ED Course  Procedures (including critical care time) Labs Review Labs Reviewed - No data to display  Imaging Review No results found. I have personally reviewed and evaluated these images and lab results as part of my medical decision-making.   EKG Interpretation None      MDM   Final diagnoses:  Chest pain, unspecified chest pain type    Mr Gokey has pain "in my left tit." Chest wall is nontender. Lungs sound bad but suspect they chronically do. He continues to smoke. He is not hypoxic and he is in no distress. It's hard to differentiate between true symptomatology and secondary gain with  Mr Abbitt. He is in the ED all the time with similar complaints. He is homeless and often asks for food. He reports a productive cough. Has not had CXR in about a month. I think reasonable to check one today. EKG without acute change. He does have a cardiac history, but current symptoms seems atypical for ACS. Doubt PE or dissection. He continues to search through drawers and cabinets to steal things which have no significant street value (alcohol swabs, 4x4s, kleenex, etc)   Virgel Manifold, MD 11/01/15 2200

## 2015-10-22 NOTE — ED Provider Notes (Signed)
CSN: [id number]     Arrival date & time 10/22/15  1704 History   None    Chief Complaint  Patient presents with  . Chest Pain     (Consider location/radiation/quality/duration/timing/severity/associated sxs/prior Treatment) HPI    [name] is a 70 y.o. male complaining of retrosternal chest pain, nonradiating associated with cough and shortness of breath onset 2 days ago. Denies fever, chills, palpitations, syncope, increasing peripheral edema, rates his pain at 8 out of 10, no exacerbating or alleviating factors identified. Pain unchanged from earlier in the day.  Past Medical History  Diagnosis Date  . Depression   . Fall at nursing home     December 2014  . History of migraine   . Gout attack   . Complete heart block ([place])     St. Jude pacemaker - followed by Dr. Lovena Le  . Chronic atrial fibrillation ([place])   . HLD (hyperlipidemia)   . GIB (gastrointestinal bleeding)   . BPH (benign prostatic hyperplasia)   . Essential hypertension   . Gastric ulcer   . Non Hodgkin's lymphoma ([place])   . Tubular adenoma of colon   . Aspiration pneumonia ([place]) 10/07/2014  . 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 Atypical angina ([place]) 12/01/2014  . BLOCK, AV, COMPLETE 12/24/2006    Annotation: Pacer placed Qualifier: Diagnosis of  By: Hilma Favors  DO, Beth    . 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 Fracture femur, cervicotrochanteric ([place]) 05/27/2013   Past Surgical History  Procedure Laterality Date  . Pacemaker generator change      Dr. Lovena Le  . Appendectomy    . Cholecystectomy    . Open reduction of hip Right 05/28/2013    Procedure: OPEN REDUCTION OF HIP;  Surgeon: Renette Butters, MD;  Location: [place];  Service: Orthopedics;  Laterality: Right;  . Esophagogastroduodenoscopy (egd) with propofol N/A 09/26/2014    Procedure:  ESOPHAGOGASTRODUODENOSCOPY (EGD) WITH PROPOFOL;  Surgeon: Jerene Bears, MD;  Location: [facility] ENDOSCOPY;  Service: Endoscopy;  Laterality: N/A;  . Colonoscopy N/A 10/01/2014    Procedure: COLONOSCOPY;  Surgeon: Jerene Bears, MD;  Location: [facility] ENDOSCOPY;  Service: Endoscopy;  Laterality: N/A;  . Partial colectomy N/A 10/05/2014    Procedure: SUBTOTAL COLECTOMY WITH ILEORECTAL ANASTOMOSIS;  Surgeon: Donnie Mesa, MD;  Location: [place];  Service: General;  Laterality: N/A;  . Flexible sigmoidoscopy N/A 11/07/2014    Procedure: FLEXIBLE SIGMOIDOSCOPY;  Surgeon: Jerene Bears, MD;  Location: [facility] ENDOSCOPY;  Service: Endoscopy;  Laterality: N/A;  . Gastric resection    . Flexible sigmoidoscopy N/A 07/30/2015    Procedure: FLEXIBLE SIGMOIDOSCOPY;  Surgeon: Doran Stabler, MD;  Location: [facility] ENDOSCOPY;  Service: Endoscopy;  Laterality: N/A;  . Transanal excision of rectal mass N/A 08/03/2015    Procedure: TRANSANAL MINIMALLY INVASIVE SURGERY TO RESECT RECTAL POLYP;  Surgeon: [name], MD;  Location: [facility]S;  Service: General;  Laterality: N/A;  . Flexible sigmoidoscopy N/A 08/03/2015    Procedure: FLEXIBLE SIGMOIDOSCOPY;  Surgeon: [name], MD;  Location: [facility] ORS;  Service: General;  Laterality: N/A;   Family History  Problem Relation Age of Onset  . Colon cancer Mother    Social History  Substance Use Topics  . Smoking status: Current Every Day Smoker -- 0.50 packs/day for 66 years  Types: Cigarettes  . Smokeless tobacco: Former Systems developer    Types: Snuff, Chew     Comment: 05/27/2013 "haven't used chew or snuff in ~ 30 yr"  . Alcohol Use: 0.0 oz/week    0 Standard drinks or equivalent per week     Comment: licqor monthly    Review of Systems  10 systems reviewed and found to be negative, except as noted in the HPI.   Allergies  Review of patient's allergies indicates no known allergies.  Home Medications   Prior to Admission medications   Medication Sig Start Date End Date Taking?  Authorizing Provider  albuterol (PROVENTIL HFA;VENTOLIN HFA) 108 (90 Base) MCG/ACT inhaler Inhale 2 puffs into the lungs every 4 (four) hours as needed for wheezing or shortness of breath. 08/07/15   Venetia Maxon Rama, MD  aspirin EC 81 MG tablet Take 81 mg by mouth daily.    Historical Provider, MD  carvedilol (COREG) 6.25 MG tablet Take 1 tablet (6.25 mg total) by mouth 2 (two) times daily with a meal. 09/23/15   Blanchie Dessert, MD  diphenoxylate-atropine (LOMOTIL) 2.5-0.025 MG tablet Take 1 tablet by mouth 4 (four) times daily as needed for diarrhea or loose stools. 09/25/15   Tanna Furry, MD  feeding supplement, ENSURE ENLIVE, (ENSURE ENLIVE) LIQD Take 237 mLs by mouth 2 (two) times daily between meals. 08/07/15   Venetia Maxon Rama, MD   Physical Exam  Constitutional: He is oriented to person, place, and time. He appears well-developed and well-nourished. No distress.  HENT:  Head: Normocephalic.  Mouth/Throat: Oropharynx is clear and moist.  Eyes: Conjunctivae are normal.  Neck: Normal range of motion. No JVD present. No tracheal deviation present.  Cardiovascular: Regular rhythm and intact distal pulses.   Irregularly irregular, Radial pulse equal bilaterally  Pulmonary/Chest: Effort normal and breath sounds normal. No stridor. No respiratory distress. He has no wheezes. He has no rales. He exhibits no tenderness.  Abdominal: Soft. He exhibits no distension and no mass. There is no tenderness. There is no rebound and no guarding.  Musculoskeletal: Normal range of motion. He exhibits no edema or tenderness.  No calf asymmetry, superficial collaterals, palpable cords, edema, Homans sign negative bilaterally.    Neurological: He is alert and oriented to person, place, and time.  Skin: Skin is warm. He is not diaphoretic.  Psychiatric: He has a normal mood and affect.  Nursing note and vitals reviewed.   ED Course  Procedures (including critical care time) Labs Review Labs Reviewed - No  data to display  Imaging Review Dg Chest 2 View  10/22/2015  CLINICAL DATA:  Left-sided chest pain with shortness of breath beginning this morning. History of asthma. EXAM: CHEST  2 VIEW COMPARISON:  09/24/2015 FINDINGS: Right-sided pacemaker unchanged. Lungs are adequately inflated without consolidation or effusion. Cardiomediastinal silhouette is within normal. There is calcified plaque over the aortic arch. Mild degenerate change of the spine. IMPRESSION: No active cardiopulmonary disease. Electronically Signed   By: Marin Olp M.D.   On: 10/22/2015 11:06   I have personally reviewed and evaluated these images and lab results as part of my medical decision-making.   EKG Interpretation   Date/Time:  Sunday Oct 22 2015 17:15:16 EDT Ventricular Rate:  99 PR Interval:    QRS Duration: 96 QT Interval:  356 QTC Calculation: 457 R Axis:   -108 Text Interpretation:  Atrial fibrillation Left anterior fascicular block  Anterior infarct, old Confirmed by Johnney Killian, MD, Jeannie Done [phone]) on  10/22/2015 6:56:35  PM      MDM   Final diagnoses:  Atypical chest pain    [name] is 70 y.o. male presenting with persistent chest pain earlier in the day, brought in by EMS who administered nitroglycerin and aspirin., EKG with A. fib, no acute changes, chest x-ray clear, troponin negative. The department before we could discuss results, gave verbal okay for nurse to allow the patient to leave.  Evaluation does not show pathology that would require ongoing emergent intervention or inpatient treatment. Pt is hemodynamically stable and mentating appropriately. Discussed findings and plan with patient/guardian, who agrees with care plan. All questions answered. Return precautions discussed and outpatient follow up given.      Monico Blitz, PA-C 10/22/15 2144  Charlesetta Shanks, MD 10/26/15 [phone]

## 2015-10-22 NOTE — Discharge Instructions (Signed)
Nonspecific Chest Pain  °Chest pain can be caused by many different conditions. There is always a chance that your pain could be related to something serious, such as a heart attack or a blood clot in your lungs. Chest pain can also be caused by conditions that are not life-threatening. If you have chest pain, it is very important to follow up with your health care provider. °CAUSES  °Chest pain can be caused by: °· Heartburn. °· Pneumonia or bronchitis. °· Anxiety or stress. °· Inflammation around your heart (pericarditis) or lung (pleuritis or pleurisy). °· A blood clot in your lung. °· A collapsed lung (pneumothorax). It can develop suddenly on its own (spontaneous pneumothorax) or from trauma to the chest. °· Shingles infection (varicella-zoster virus). °· Heart attack. °· Damage to the bones, muscles, and cartilage that make up your chest wall. This can include: °¨ Bruised bones due to injury. °¨ Strained muscles or cartilage due to frequent or repeated coughing or overwork. °¨ Fracture to one or more ribs. °¨ Sore cartilage due to inflammation (costochondritis). °RISK FACTORS  °Risk factors for chest pain may include: °· Activities that increase your risk for trauma or injury to your chest. °· Respiratory infections or conditions that cause frequent coughing. °· Medical conditions or overeating that can cause heartburn. °· Heart disease or family history of heart disease. °· Conditions or health behaviors that increase your risk of developing a blood clot. °· Having had chicken pox (varicella zoster). °SIGNS AND SYMPTOMS °Chest pain can feel like: °· Burning or tingling on the surface of your chest or deep in your chest. °· Crushing, pressure, aching, or squeezing pain. °· Dull or sharp pain that is worse when you move, cough, or take a deep breath. °· Pain that is also felt in your back, neck, shoulder, or arm, or pain that spreads to any of these areas. °Your chest pain may come and go, or it may stay  constant. °DIAGNOSIS °Lab tests or other studies may be needed to find the cause of your pain. Your health care provider may have you take a test called an ambulatory ECG (electrocardiogram). An ECG records your heartbeat patterns at the time the test is performed. You may also have other tests, such as: °· Transthoracic echocardiogram (TTE). During echocardiography, sound waves are used to create a picture of all of the heart structures and to look at how blood flows through your heart. °· Transesophageal echocardiogram (TEE). This is a more advanced imaging test that obtains images from inside your body. It allows your health care provider to see your heart in finer detail. °· Cardiac monitoring. This allows your health care provider to monitor your heart rate and rhythm in real time. °· Holter monitor. This is a portable device that records your heartbeat and can help to diagnose abnormal heartbeats. It allows your health care provider to track your heart activity for several days, if needed. °· Stress tests. These can be done through exercise or by taking medicine that makes your heart beat more quickly. °· Blood tests. °· Imaging tests. °TREATMENT  °Your treatment depends on what is causing your chest pain. Treatment may include: °· Medicines. These may include: °¨ Acid blockers for heartburn. °¨ Anti-inflammatory medicine. °¨ Pain medicine for inflammatory conditions. °¨ Antibiotic medicine, if an infection is present. °¨ Medicines to dissolve blood clots. °¨ Medicines to treat coronary artery disease. °· Supportive care for conditions that do not require medicines. This may include: °¨ Resting. °¨ Applying heat   or cold packs to injured areas. °¨ Limiting activities until pain decreases. °HOME CARE INSTRUCTIONS °· If you were prescribed an antibiotic medicine, finish it all even if you start to feel better. °· Avoid any activities that bring on chest pain. °· Do not use any tobacco products, including  cigarettes, chewing tobacco, or electronic cigarettes. If you need help quitting, ask your health care provider. °· Do not drink alcohol. °· Take medicines only as directed by your health care provider. °· Keep all follow-up visits as directed by your health care provider. This is important. This includes any further testing if your chest pain does not go away. °· If heartburn is the cause for your chest pain, you may be told to keep your head raised (elevated) while sleeping. This reduces the chance that acid will go from your stomach into your esophagus. °· Make lifestyle changes as directed by your health care provider. These may include: °¨ Getting regular exercise. Ask your health care provider to suggest some activities that are safe for you. °¨ Eating a heart-healthy diet. A registered dietitian can help you to learn healthy eating options. °¨ Maintaining a healthy weight. °¨ Managing diabetes, if necessary. °¨ Reducing stress. °SEEK MEDICAL CARE IF: °· Your chest pain does not go away after treatment. °· You have a rash with blisters on your chest. °· You have a fever. °SEEK IMMEDIATE MEDICAL CARE IF:  °· Your chest pain is worse. °· You have an increasing cough, or you cough up blood. °· You have severe abdominal pain. °· You have severe weakness. °· You faint. °· You have chills. °· You have sudden, unexplained chest discomfort. °· You have sudden, unexplained discomfort in your arms, back, neck, or jaw. °· You have shortness of breath at any time. °· You suddenly start to sweat, or your skin gets clammy. °· You feel nauseous or you vomit. °· You suddenly feel light-headed or dizzy. °· Your heart begins to beat quickly, or it feels like it is skipping beats. °These symptoms may represent a serious problem that is an emergency. Do not wait to see if the symptoms will go away. Get medical help right away. Call your local emergency services (911 in the [place] Do not drive yourself to the hospital. °  °This  information is not intended to replace advice given to you by your health care provider. Make sure you discuss any questions you have with your health care provider. °  °Document Released: 03/06/2005 Document Revised: 06/17/2014 Document Reviewed: 12/31/2013 °Elsevier Interactive Patient Education ©2016 Elsevier Inc[employer] ° °

## 2015-10-22 NOTE — ED Notes (Signed)
Pt off unit with xray 

## 2015-10-22 NOTE — ED Notes (Signed)
Pt ambulated to the restroom in a steady manner 

## 2015-10-22 NOTE — ED Notes (Signed)
Pt left out of ED department with all belongings.

## 2015-10-22 NOTE — ED Notes (Addendum)
Received pt via EMS with c/o chest pain to radiates to left arm. Pt seen here earlier today for same. Pt given ASA and Nitro by EMS

## 2015-10-22 NOTE — ED Notes (Signed)
Pt refuses to stay on the cardiac monitor. Pt grabbed his belongings attempting to leave. Instructed to return to room because he has an IV in

## 2015-10-22 NOTE — ED Notes (Signed)
Pt escorted back to room by Tech first. Pt was trying to go outside to smoke.

## 2015-10-23 ENCOUNTER — Encounter (HOSPITAL_COMMUNITY): Payer: Self-pay | Admitting: Emergency Medicine

## 2015-10-23 ENCOUNTER — Emergency Department (HOSPITAL_COMMUNITY)
Admission: EM | Admit: 2015-10-23 | Discharge: 2015-10-23 | Disposition: A | Discharge status: Home / Self Care | Payer: Medicare Other | Source: Home / Self Care | Attending: Emergency Medicine | Admitting: Emergency Medicine

## 2015-10-23 ENCOUNTER — Emergency Department (HOSPITAL_COMMUNITY)
Admission: EM | Admit: 2015-10-23 | Discharge: 2015-10-23 | Disposition: A | Discharge status: Home / Self Care | Payer: Medicare Other | Attending: Emergency Medicine | Admitting: Emergency Medicine

## 2015-10-23 DIAGNOSIS — R079 Chest pain, unspecified: Secondary | ICD-10-CM | POA: Diagnosis present

## 2015-10-23 DIAGNOSIS — Z8739 Personal history of other diseases of the musculoskeletal system and connective tissue: Secondary | ICD-10-CM | POA: Diagnosis not present

## 2015-10-23 DIAGNOSIS — G8929 Other chronic pain: Secondary | ICD-10-CM

## 2015-10-23 DIAGNOSIS — I25119 Atherosclerotic heart disease of native coronary artery with unspecified angina pectoris: Secondary | ICD-10-CM

## 2015-10-23 DIAGNOSIS — Z8639 Personal history of other endocrine, nutritional and metabolic disease: Secondary | ICD-10-CM | POA: Diagnosis not present

## 2015-10-23 DIAGNOSIS — Z8659 Personal history of other mental and behavioral disorders: Secondary | ICD-10-CM | POA: Diagnosis not present

## 2015-10-23 DIAGNOSIS — Z8781 Personal history of (healed) traumatic fracture: Secondary | ICD-10-CM | POA: Insufficient documentation

## 2015-10-23 DIAGNOSIS — I10 Essential (primary) hypertension: Secondary | ICD-10-CM

## 2015-10-23 DIAGNOSIS — Z8572 Personal history of non-Hodgkin lymphomas: Secondary | ICD-10-CM | POA: Diagnosis not present

## 2015-10-23 DIAGNOSIS — Z8719 Personal history of other diseases of the digestive system: Secondary | ICD-10-CM | POA: Insufficient documentation

## 2015-10-23 DIAGNOSIS — Z8701 Personal history of pneumonia (recurrent): Secondary | ICD-10-CM | POA: Diagnosis not present

## 2015-10-23 DIAGNOSIS — Z86010 Personal history of colonic polyps: Secondary | ICD-10-CM | POA: Insufficient documentation

## 2015-10-23 DIAGNOSIS — F17210 Nicotine dependence, cigarettes, uncomplicated: Secondary | ICD-10-CM | POA: Insufficient documentation

## 2015-10-23 DIAGNOSIS — I482 Chronic atrial fibrillation: Secondary | ICD-10-CM | POA: Insufficient documentation

## 2015-10-23 NOTE — ED Notes (Signed)
Pt called out three times in the last ten minutes. Called from blanket, [place] sandwich, and pain medicine. Pt made aware that the call light is for emergencies and reviewed reasons to use the call light. Pt verbalized understanding.

## 2015-10-23 NOTE — Discharge Instructions (Signed)

## 2015-10-23 NOTE — ED Notes (Signed)
Pt requested to leave when made aware he would not get pain medicine, food, or drink. MD made aware and stated he was approved to be discharged. Pt was then found in the hallway walking out. Tonia G[place]t, NT wheeled patient out.

## 2015-10-23 NOTE — ED Notes (Signed)
Per EMS: pt c/o CP; pt seen here this am for same

## 2015-10-23 NOTE — ED Provider Notes (Signed)
CSN: [id number]     Arrival date & time 10/23/15  [id number] History   First MD Initiated Contact with Patient 10/23/15 0701     Chief Complaint  Patient presents with  . Chest Pain     (Consider location/radiation/quality/duration/timing/severity/associated sxs/prior Treatment) HPI  70 y.o male with history of complete heart block, chronic a fib, s/p St. Jude Pacemaker with frequent ED presentations for pain presents today today stating he had chest pain last night of unclear duration.  Pain was sharp left anterior chest sharp, no radiation.  Patient denies any associated symptoms.  Pain like other pain he has had but resolved prior to ed with nitro per ems.    Past Medical History  Diagnosis Date  . Depression   . Fall at nursing home     December 2014  . History of migraine   . Gout attack   . Complete heart block ([place])     St. Jude pacemaker - followed by Dr. Lovena Le  . Chronic atrial fibrillation ([place])   . HLD (hyperlipidemia)   . GIB (gastrointestinal bleeding)   . BPH (benign prostatic hyperplasia)   . Essential hypertension   . Gastric ulcer   . Non Hodgkin's lymphoma ([place])   . Tubular adenoma of colon   . Aspiration pneumonia ([place]) 10/07/2014  . 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 Atypical angina ([place]) 12/01/2014  . BLOCK, AV, COMPLETE 12/24/2006    Annotation: Pacer placed Qualifier: Diagnosis of  By: Hilma Favors  DO, Beth    . 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 Fracture femur, cervicotrochanteric ([place]) 05/27/2013   Past Surgical History  Procedure Laterality Date  . Pacemaker generator change      Dr. Lovena Le  . Appendectomy    . Cholecystectomy    . Open reduction of hip Right 05/28/2013    Procedure: OPEN REDUCTION OF HIP;  Surgeon: Renette Butters, MD;  Location: [facility];  Service: Orthopedics;  Laterality: Right;  .  Esophagogastroduodenoscopy (egd) with propofol N/A 09/26/2014    Procedure: ESOPHAGOGASTRODUODENOSCOPY (EGD) WITH PROPOFOL;  Surgeon: Jerene Bears, MD;  Location: [facility] ENDOSCOPY;  Service: Endoscopy;  Laterality: N/A;  . Colonoscopy N/A 10/01/2014    Procedure: COLONOSCOPY;  Surgeon: Jerene Bears, MD;  Location: [facility] ENDOSCOPY;  Service: Endoscopy;  Laterality: N/A;  . Partial colectomy N/A 10/05/2014    Procedure: SUBTOTAL COLECTOMY WITH ILEORECTAL ANASTOMOSIS;  Surgeon: Donnie Mesa, MD;  Location: [facility];  Service: General;  Laterality: N/A;  . Flexible sigmoidoscopy N/A 11/07/2014    Procedure: FLEXIBLE SIGMOIDOSCOPY;  Surgeon: Jerene Bears, MD;  Location: [facility] ENDOSCOPY;  Service: Endoscopy;  Laterality: N/A;  . Gastric resection    . Flexible sigmoidoscopy N/A 07/30/2015    Procedure: FLEXIBLE SIGMOIDOSCOPY;  Surgeon: Doran Stabler, MD;  [facility];  Service: Endoscopy;  Laterality: N/A;  . Transanal excision of rectal mass N/A 08/03/2015    Procedure: TRANSANAL MINIMALLY INVASIVE SURGERY TO RESECT RECTAL POLYP;  Surgeon: [name], MD;  [facility];  Service: General;  Laterality: N/A;  . Flexible sigmoidoscopy N/A 08/03/2015    Procedure: FLEXIBLE SIGMOIDOSCOPY;  Surgeon: [name], MD;  [facility];  Service: General;  Laterality: N/A;   Family History  Problem Relation Age of Onset  . Colon cancer Mother  Social History  Substance Use Topics  . Smoking status: Current Every Day Smoker -- 0.50 packs/day for 66 years    Types: Cigarettes  . Smokeless tobacco: Former Systems developer    Types: Snuff, Chew     Comment: 05/27/2013 "haven't used chew or snuff in ~ 30 yr"  . Alcohol Use: 0.0 oz/week    0 Standard drinks or equivalent per week     Comment: licqor monthly    Review of Systems  All other systems reviewed and are negative.     Allergies  Review of patient's allergies indicates no known allergies.  Home Medications   Prior to Admission medications    Medication Sig Start Date End Date Taking? Authorizing Provider  albuterol (PROVENTIL HFA;VENTOLIN HFA) 108 (90 Base) MCG/ACT inhaler Inhale 2 puffs into the lungs every 4 (four) hours as needed for wheezing or shortness of breath. 08/07/15   Venetia Maxon Rama, MD  aspirin EC 81 MG tablet Take 81 mg by mouth daily.    Historical Provider, MD  carvedilol (COREG) 6.25 MG tablet Take 1 tablet (6.25 mg total) by mouth 2 (two) times daily with a meal. 09/23/15   Blanchie Dessert, MD  diphenoxylate-atropine (LOMOTIL) 2.5-0.025 MG tablet Take 1 tablet by mouth 4 (four) times daily as needed for diarrhea or loose stools. 09/25/15   Tanna Furry, MD  feeding supplement, ENSURE ENLIVE, (ENSURE ENLIVE) LIQD Take 237 mLs by mouth 2 (two) times daily between meals. 08/07/15   Christina P Rama, MD   BP 134/64 mmHg  Pulse 96  Resp 18  SpO2 98% Physical Exam  Constitutional: He is oriented to person, place, and time. He appears well-developed and well-nourished. No distress.  Unkempt male  HENT:  Head: Normocephalic and atraumatic.  Right Ear: External ear normal.  Left Ear: External ear normal.  Nose: Nose normal.  Mouth/Throat: Oropharynx is clear and moist.  Eyes: Conjunctivae are normal. Pupils are equal, round, and reactive to light.  Neck: Normal range of motion. Neck supple.  Cardiovascular: An irregularly irregular rhythm present.  Pulmonary/Chest: Effort normal and breath sounds normal.  Few expiratory wheezes left base  Abdominal: Soft. Bowel sounds are normal. He exhibits no distension.  Musculoskeletal: Normal range of motion.  Dressing in place left forearm  Neurological: He is alert and oriented to person, place, and time. He has normal reflexes. No cranial nerve deficit. Coordination normal.  Skin: Skin is warm and dry.  Psychiatric: He has a normal mood and affect.  Nursing note and vitals reviewed.   ED Course  Procedures (including critical care time) Labs Review Labs Reviewed - No  data to display  Imaging Review Dg Chest 2 View  10/22/2015  CLINICAL DATA:  Left-sided chest pain with shortness of breath beginning this morning. History of asthma. EXAM: CHEST  2 VIEW COMPARISON:  09/24/2015 FINDINGS: Right-sided pacemaker unchanged. Lungs are adequately inflated without consolidation or effusion. Cardiomediastinal silhouette is within normal. There is calcified plaque over the aortic arch. Mild degenerate change of the spine. IMPRESSION: No active cardiopulmonary disease. Electronically Signed   By: Marin Olp M.D.   On: 10/22/2015 11:06   Dg Chest Port 1 View  10/22/2015  CLINICAL DATA:  Chest pain EXAM: PORTABLE CHEST 1 VIEW COMPARISON:  10/22/2015 FINDINGS: Cardiac shadow is stable. A pacing device is again seen and stable. Lungs are well aerated bilaterally. No focal infiltrate or sizable effusion is seen. No bony abnormality is noted. The overall appearance is stable from the previous exam.  IMPRESSION: No change from the film earlier in the same day. Electronically Signed   By: Inez Catalina M.D.   On: 10/22/2015 17:51   I have personally reviewed and evaluated these images and lab results as part of my medical decision-making.   EKG Interpretation   Date/Time:  Monday Oct 23 2015 07:13:38 EDT Ventricular Rate:  84 PR Interval:    QRS Duration: 107 QT Interval:  360 QTC Calculation: 425 R Axis:   -169 Text Interpretation:  Right and left arm electrode reversal,  interpretation assumes no reversal Atrial fibrillation Ventricular  premature complex Right axis deviation Consider anterolateral infarct  Borderline T abnormalities, inferior leads Confirmed by Garett Tetzloff MD, Andee Poles  [id number]) on 10/23/2015 7:20:23 AM      MDM   Final diagnoses:  Chest pain, unspecified chest pain type  Chronic pain      Pattricia Boss, MD 10/23/15 [id number]

## 2015-10-23 NOTE — ED Notes (Signed)
Per EMS, pt with c/o left sided chest pain, radiating to the left side. Pain 10/10, 324 aspirin and 1 nitro given. 6/10 pain after nitro. BP-138/72

## 2015-10-23 NOTE — ED Provider Notes (Signed)
Patient returned after discharge complaining of ongoing chest pain. He continues to appear hemodynamically stable with exam unchanged from prior exam today. Patient now wishes to leave and is discharged.  Pattricia Boss, MD 10/23/15 1325

## 2015-10-24 ENCOUNTER — Emergency Department (HOSPITAL_COMMUNITY)
Admission: EM | Admit: 2015-10-24 | Discharge: 2015-10-24 | Disposition: A | Discharge status: Home / Self Care | Payer: Medicare Other | Attending: Emergency Medicine | Admitting: Emergency Medicine

## 2015-10-24 ENCOUNTER — Encounter (HOSPITAL_COMMUNITY): Payer: Self-pay | Admitting: Emergency Medicine

## 2015-10-24 DIAGNOSIS — R079 Chest pain, unspecified: Secondary | ICD-10-CM | POA: Insufficient documentation

## 2015-10-24 DIAGNOSIS — F17210 Nicotine dependence, cigarettes, uncomplicated: Secondary | ICD-10-CM | POA: Insufficient documentation

## 2015-10-24 DIAGNOSIS — I10 Essential (primary) hypertension: Secondary | ICD-10-CM | POA: Insufficient documentation

## 2015-10-24 DIAGNOSIS — I252 Old myocardial infarction: Secondary | ICD-10-CM | POA: Diagnosis not present

## 2015-10-24 LAB — I-STAT TROPONIN, ED: Troponin i, poc: 0.04 ng/mL (ref 0.00–0.08)

## 2015-10-24 NOTE — ED Notes (Signed)
When cleaning room, discovered patient had packed hospital belongings including linen, IV kits and supplies, socks, and kerlix to take with him while leaving AMA. Patient not allowed to leave with supplies after speaking with staff.

## 2015-10-24 NOTE — ED Notes (Signed)
Patient reports chest pain. afib on the monitor. 324mg  of aspirin given by ems

## 2015-10-24 NOTE — ED Notes (Signed)
Found patient in hallway, refusing to stay. Patient leaves AMA.

## 2015-10-24 NOTE — ED Notes (Signed)
Patient escorted out of department by this RN, given a bus pass by security.  Case manager notified.

## 2015-10-24 NOTE — ED Provider Notes (Signed)
8:27 PM I went to go see patient and he had already walked out saying that he was leaving. Nurse states he was grabbing things in the room and putting them in a bag. He left those when told to by the nurse. I did not evaluate this patient. No ECG or labs were drawn on this patient.   Sherwood Gambler, MD 10/24/15 2028

## 2015-10-25 ENCOUNTER — Emergency Department (HOSPITAL_COMMUNITY)
Admission: EM | Admit: 2015-10-25 | Discharge: 2015-10-25 | Disposition: A | Discharge status: Home / Self Care | Payer: Medicare Other | Source: Home / Self Care | Attending: Emergency Medicine | Admitting: Emergency Medicine

## 2015-10-25 ENCOUNTER — Emergency Department (HOSPITAL_COMMUNITY)
Admission: EM | Admit: 2015-10-25 | Discharge: 2015-10-25 | Disposition: A | Discharge status: Home / Self Care | Payer: Medicare Other | Source: Home / Self Care

## 2015-10-25 ENCOUNTER — Encounter (HOSPITAL_COMMUNITY): Payer: Self-pay | Admitting: Emergency Medicine

## 2015-10-25 ENCOUNTER — Emergency Department (HOSPITAL_COMMUNITY)
Admission: EM | Admit: 2015-10-25 | Discharge: 2015-10-25 | Disposition: A | Discharge status: Home / Self Care | Payer: Medicare Other | Attending: Emergency Medicine | Admitting: Emergency Medicine

## 2015-10-25 DIAGNOSIS — Z86010 Personal history of colonic polyps: Secondary | ICD-10-CM | POA: Insufficient documentation

## 2015-10-25 DIAGNOSIS — Z8739 Personal history of other diseases of the musculoskeletal system and connective tissue: Secondary | ICD-10-CM | POA: Insufficient documentation

## 2015-10-25 DIAGNOSIS — I10 Essential (primary) hypertension: Secondary | ICD-10-CM | POA: Insufficient documentation

## 2015-10-25 DIAGNOSIS — Z7982 Long term (current) use of aspirin: Secondary | ICD-10-CM

## 2015-10-25 DIAGNOSIS — Z950 Presence of cardiac pacemaker: Secondary | ICD-10-CM | POA: Insufficient documentation

## 2015-10-25 DIAGNOSIS — R05 Cough: Secondary | ICD-10-CM | POA: Insufficient documentation

## 2015-10-25 DIAGNOSIS — Z8572 Personal history of non-Hodgkin lymphomas: Secondary | ICD-10-CM

## 2015-10-25 DIAGNOSIS — Z8719 Personal history of other diseases of the digestive system: Secondary | ICD-10-CM | POA: Insufficient documentation

## 2015-10-25 DIAGNOSIS — Z8639 Personal history of other endocrine, nutritional and metabolic disease: Secondary | ICD-10-CM | POA: Insufficient documentation

## 2015-10-25 DIAGNOSIS — Z8781 Personal history of (healed) traumatic fracture: Secondary | ICD-10-CM

## 2015-10-25 DIAGNOSIS — R109 Unspecified abdominal pain: Secondary | ICD-10-CM | POA: Insufficient documentation

## 2015-10-25 DIAGNOSIS — Z9049 Acquired absence of other specified parts of digestive tract: Secondary | ICD-10-CM

## 2015-10-25 DIAGNOSIS — F17210 Nicotine dependence, cigarettes, uncomplicated: Secondary | ICD-10-CM

## 2015-10-25 DIAGNOSIS — I252 Old myocardial infarction: Secondary | ICD-10-CM

## 2015-10-25 DIAGNOSIS — G8929 Other chronic pain: Secondary | ICD-10-CM | POA: Insufficient documentation

## 2015-10-25 DIAGNOSIS — R079 Chest pain, unspecified: Secondary | ICD-10-CM

## 2015-10-25 DIAGNOSIS — Z8701 Personal history of pneumonia (recurrent): Secondary | ICD-10-CM | POA: Insufficient documentation

## 2015-10-25 DIAGNOSIS — Z79899 Other long term (current) drug therapy: Secondary | ICD-10-CM | POA: Insufficient documentation

## 2015-10-25 DIAGNOSIS — R0789 Other chest pain: Secondary | ICD-10-CM | POA: Insufficient documentation

## 2015-10-25 DIAGNOSIS — Z9181 History of falling: Secondary | ICD-10-CM

## 2015-10-25 DIAGNOSIS — R0602 Shortness of breath: Secondary | ICD-10-CM

## 2015-10-25 DIAGNOSIS — Z87438 Personal history of other diseases of male genital organs: Secondary | ICD-10-CM | POA: Insufficient documentation

## 2015-10-25 DIAGNOSIS — R062 Wheezing: Secondary | ICD-10-CM | POA: Insufficient documentation

## 2015-10-25 DIAGNOSIS — Z8659 Personal history of other mental and behavioral disorders: Secondary | ICD-10-CM | POA: Insufficient documentation

## 2015-10-25 LAB — I-STAT CHEM 8, ED
BUN: 20 mg/dL (ref 6–20)
CHLORIDE: 105 mmol/L (ref 101–111)
Calcium, Ion: 1.23 mmol/L (ref 1.13–1.30)
Creatinine, Ser: 0.8 mg/dL (ref 0.61–1.24)
Glucose, Bld: 85 mg/dL (ref 65–99)
HEMATOCRIT: 32 % — AB (ref 39.0–52.0)
HEMOGLOBIN: 10.9 g/dL — AB (ref 13.0–17.0)
POTASSIUM: 4.1 mmol/L (ref 3.5–5.1)
SODIUM: 141 mmol/L (ref 135–145)
TCO2: 24 mmol/L (ref 0–100)

## 2015-10-25 MED ORDER — ALBUTEROL SULFATE HFA 108 (90 BASE) MCG/ACT IN AERS
2.0000 | INHALATION_SPRAY | Freq: Once | RESPIRATORY_TRACT | Status: AC
  Administered 2015-10-25: 2 via RESPIRATORY_TRACT
  Filled 2015-10-25: qty 6.7

## 2015-10-25 NOTE — ED Notes (Signed)
Pt here for CP for the third time today, pt has left each time immediatly after triage. Pt in no distress at this time.

## 2015-10-25 NOTE — Discharge Instructions (Signed)

## 2015-10-25 NOTE — ED Notes (Signed)
Pt no longer sitting on the bench outside of the ED entrance. Pt no where in sight.

## 2015-10-25 NOTE — ED Notes (Signed)
Pt is now back in sight and sitting outside of ED entrance.

## 2015-10-25 NOTE — ED Notes (Signed)
Pt eloped from room without informing RN.

## 2015-10-25 NOTE — ED Provider Notes (Signed)
CSN: [id number]     Arrival date & time 10/25/15  [id number] History   First MD Initiated Contact with Patient 10/25/15 [phone]     Chief Complaint  Patient presents with  . Pacemaker Problem     (Consider location/radiation/quality/duration/timing/severity/associated sxs/prior Treatment) Patient is a 70 y.o. male presenting with chest pain. The history is provided by the patient.  Chest Pain Pain location:  L chest and L lateral chest Pain quality: sharp   Pain radiates to:  Does not radiate Pain radiates to the back: no   Pain severity:  Mild Onset quality:  Sudden Duration:  2 days Timing:  Constant Chronicity:  Chronic Relieved by:  Nothing Worsened by:  Nothing tried Ineffective treatments:  None tried Associated symptoms: no abdominal pain, no fever, no headache, no palpitations, no shortness of breath and not vomiting    70 yo M With a chief complaint of chest pain. This feels like his chronic chest pain. Been ongoing for the past few weeks. Patient was seen here yesterday about 3 times. Left AMA and had some episodes were he stole things from the room. The patient also feels that his pacemaker may have fired. He is unable to describe the sensation to me. Having his normal chronic chest pain. Does not feel any different to him. Denies lower extremity edema fevers cough congestion.  Past Medical History  Diagnosis Date  . Depression   . Fall at nursing home     December 2014  . History of migraine   . Gout attack   . Complete heart block ([place])     St. Jude pacemaker - followed by Dr. Lovena Le  . Chronic atrial fibrillation ([place])   . HLD (hyperlipidemia)   . GIB (gastrointestinal bleeding)   . BPH (benign prostatic hyperplasia)   . Essential hypertension   . Gastric ulcer   . Non Hodgkin's lymphoma ([place])   . Tubular adenoma of colon   . Aspiration pneumonia ([place]) 10/07/2014  . 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 Atypical  angina ([place]) 12/01/2014  . BLOCK, AV, COMPLETE 12/24/2006    Annotation: Pacer placed Qualifier: Diagnosis of  By: Hilma Favors  DO, Beth    . 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 Fracture femur, cervicotrochanteric ([place]) 05/27/2013   Past Surgical History  Procedure Laterality Date  . Pacemaker generator change      Dr. Lovena Le  . Appendectomy    . Cholecystectomy    . Open reduction of hip Right 05/28/2013    Procedure: OPEN REDUCTION OF HIP;  Surgeon: Renette Butters, MD;  Location: [place];  Service: Orthopedics;  Laterality: Right;  . Esophagogastroduodenoscopy (egd) with propofol N/A 09/26/2014    Procedure: ESOPHAGOGASTRODUODENOSCOPY (EGD) WITH PROPOFOL;  Surgeon: Jerene Bears, MD;  Location: [facility] ENDOSCOPY;  Service: Endoscopy;  Laterality: N/A;  . Colonoscopy N/A 10/01/2014    Procedure: COLONOSCOPY;  Surgeon: Jerene Bears, MD;  Location: [facility] ENDOSCOPY;  Service: Endoscopy;  Laterality: N/A;  . Partial colectomy N/A 10/05/2014    Procedure: SUBTOTAL COLECTOMY WITH ILEORECTAL ANASTOMOSIS;  Surgeon: Donnie Mesa, MD;  Location: [place];  Service: General;  Laterality: N/A;  . Flexible sigmoidoscopy N/A 11/07/2014    Procedure: FLEXIBLE SIGMOIDOSCOPY;  Surgeon: Jerene Bears, MD;  Location: [facility] ENDOSCOPY;  Service: Endoscopy;  Laterality: N/A;  .  Gastric resection    . Flexible sigmoidoscopy N/A 07/30/2015    Procedure: FLEXIBLE SIGMOIDOSCOPY;  Surgeon: Doran Stabler, MD;  [facility] ENDOSCOPY;  Service: Endoscopy;  Laterality: N/A;  . Transanal excision of rectal mass N/A 08/03/2015    Procedure: TRANSANAL MINIMALLY INVASIVE SURGERY TO RESECT RECTAL POLYP;  Surgeon: [name], MD;  [facility];  Service: General;  Laterality: N/A;  . Flexible sigmoidoscopy N/A 08/03/2015    Procedure: FLEXIBLE SIGMOIDOSCOPY;  Surgeon: [name], MD;  [facility];  Service: General;  Laterality: N/A;    Family History  Problem Relation Age of Onset  . Colon cancer Mother    Social History  Substance Use Topics  . Smoking status: Current Every Day Smoker -- 0.50 packs/day for 66 years    Types: Cigarettes  . Smokeless tobacco: Former Systems developer    Types: Snuff, Chew     Comment: 05/27/2013 "haven't used chew or snuff in ~ 30 yr"  . Alcohol Use: 0.0 oz/week    0 Standard drinks or equivalent per week     Comment: licqor monthly    Review of Systems  Constitutional: Negative for fever and chills.  HENT: Negative for congestion and facial swelling.   Eyes: Negative for discharge and visual disturbance.  Respiratory: Negative for chest tightness and shortness of breath.   Cardiovascular: Positive for chest pain. Negative for palpitations.  Gastrointestinal: Negative for vomiting, abdominal pain and diarrhea.  Musculoskeletal: Negative for myalgias and arthralgias.  Skin: Negative for color change and rash.  Neurological: Negative for tremors, syncope and headaches.  Psychiatric/Behavioral: Negative for confusion and dysphoric mood.      Allergies  Review of patient's allergies indicates no known allergies.  Home Medications   Prior to Admission medications   Medication Sig Start Date End Date Taking? Authorizing Provider  albuterol (PROVENTIL HFA;VENTOLIN HFA) 108 (90 Base) MCG/ACT inhaler Inhale 2 puffs into the lungs every 4 (four) hours as needed for wheezing or shortness of breath. 08/07/15   Venetia Maxon Rama, MD  aspirin EC 81 MG tablet Take 81 mg by mouth daily.    Historical Provider, MD  carvedilol (COREG) 6.25 MG tablet Take 1 tablet (6.25 mg total) by mouth 2 (two) times daily with a meal. 09/23/15   Blanchie Dessert, MD  diazepam (VALIUM) 5 MG tablet Take 5 mg by mouth every 12 (twelve) hours as needed for anxiety.    Historical Provider, MD  diphenoxylate-atropine (LOMOTIL) 2.5-0.025 MG tablet Take 1 tablet by mouth 4 (four) times daily as needed for diarrhea or loose  stools. 09/25/15   Tanna Furry, MD  feeding supplement, ENSURE ENLIVE, (ENSURE ENLIVE) LIQD Take 237 mLs by mouth 2 (two) times daily between meals. 08/07/15   Venetia Maxon Rama, MD  oxyCODONE-acetaminophen (PERCOCET/ROXICET) 5-325 MG tablet Take 1 tablet by mouth every 6 (six) hours as needed for severe pain.    Historical Provider, MD   BP 125/67 mmHg  Pulse 116  Temp(Src) 98 F (36.7 C)  Resp 21  SpO2 93% Physical Exam  Constitutional: He is oriented to person, place, and time. He appears well-developed and well-nourished.  HENT:  Head: Normocephalic and atraumatic.  Eyes: EOM are normal. Pupils are equal, round, and reactive to light.  Neck: Normal range of motion. Neck supple. No JVD present.  Cardiovascular: Normal rate and regular rhythm.  Exam reveals no gallop and no friction rub.   No murmur heard. Pulmonary/Chest: No respiratory distress. He has no wheezes. He exhibits  tenderness (left sided).  Abdominal: He exhibits no distension. There is no tenderness. There is no rebound and no guarding.  Musculoskeletal: Normal range of motion.  Neurological: He is alert and oriented to person, place, and time.  Skin: No rash noted. No pallor.  Psychiatric: He has a normal mood and affect. His behavior is normal.  Nursing note and vitals reviewed.   ED Course  Procedures (including critical care time) Labs Review Labs Reviewed - No data to display  Imaging Review No results found. I have personally reviewed and evaluated these images and lab results as part of my medical decision-making.   EKG Interpretation   Date/Time:  Wednesday Oct 25 2015 06:57:43 EDT Ventricular Rate:  104 PR Interval:    QRS Duration: 106 QT Interval:  337 QTC Calculation: 443 R Axis:   -77 Text Interpretation:  Atrial fibrillation Ventricular premature complex  Artifact in lead(s) I II III aVR aVL V1 V2 V3 Otherwise no significant  change Confirmed by Reed Eifert MD, DANIEL [id number]) on 10/25/2015 6:59:56  AM      MDM   Final diagnoses:  Chest wall pain, chronic    70 yo M with a history of chronic chest pain. Seen here yesterday multiple times. EKG is unchanged. Patient has a subjective story of a possible pacemaker discharge. We'll have him follow-up with his cardiologist.  7:26 AM:  I have discussed the diagnosis/risks/treatment options with the patient and believe the pt to be eligible for discharge home to follow-up with Cards. We also discussed returning to the ED immediately if new or worsening sx occur. We discussed the sx which are most concerning (e.g., sudden worsening pain, fever, syncope) that necessitate immediate return. Medications administered to the patient during their visit and any new prescriptions provided to the patient are listed below.  Medications given during this visit Medications - No data to display  Discharge Medication List as of 10/25/2015  7:12 AM      The patient appears reasonably screen and/or stabilized for discharge and I doubt any other medical condition or other [facility] requiring further screening, evaluation, or treatment in the ED at this time prior to discharge.      Deno Etienne, DO 10/25/15 [phone]

## 2015-10-25 NOTE — Discharge Instructions (Signed)

## 2015-10-25 NOTE — ED Notes (Signed)
Pt present with CP which has been evaluated for multiple times over the last 24 hours. Pt alert x4. NAD at this time.

## 2015-10-25 NOTE — ED Notes (Signed)
Pt reports that his defib fired this morning, reporting that he now has chest pain

## 2015-10-25 NOTE — ED Notes (Signed)
Pt observed walking out of the ED to the benches outside, NAD.

## 2015-10-25 NOTE — ED Provider Notes (Signed)
CSN: [id number]     Arrival date & time 10/25/15  1300 History   First MD Initiated Contact with Patient 10/25/15 1422     Chief Complaint  Patient presents with  . Chest Pain     (Consider location/radiation/quality/duration/timing/severity/associated sxs/prior Treatment) HPI   Pt with chronic chest pain, St Jude pacemaker, with frequent visits to ED (51 in 6 months) including 3 yesterday and 2 today presents with sharp left sided chest pain that feels like his daily chest pain that has been ongoing since the 1980s.  States he was seen this AM and went outside, smoked 1/2 a cigarette and that pain came back.  This is identical to his typical chest pain.  No exacerbating or palliative symptoms.  Has chronic sinusitis and bronchitis since the 80s that he notes is unchanged.  Smokes 1/2 ppd.  Denies fevers, change in chronic symptoms, leg swelling, vomiting.  Does not have his inhaler today, he left it in jail.   Past Medical History  Diagnosis Date  . Depression   . Fall at nursing home     December 2014  . History of migraine   . Gout attack   . Complete heart block ([place])     St. Jude pacemaker - followed by Dr. Lovena Le  . Chronic atrial fibrillation ([place])   . HLD (hyperlipidemia)   . GIB (gastrointestinal bleeding)   . BPH (benign prostatic hyperplasia)   . Essential hypertension   . Gastric ulcer   . Non Hodgkin's lymphoma ( [place])   . Tubular adenoma of colon   . Aspiration pneumonia ([place]) 10/07/2014  . 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 Atypical angina ([place]) 12/01/2014  . BLOCK, AV, COMPLETE 12/24/2006    Annotation: Pacer placed Qualifier: Diagnosis of  By: Hilma Favors  DO, Beth    . 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 Fracture femur, cervicotrochanteric ([place]) 05/27/2013   Past Surgical History  Procedure Laterality Date  . Pacemaker  generator change      Dr. Lovena Le  . Appendectomy    . Cholecystectomy    . Open reduction of hip Right 05/28/2013    Procedure: OPEN REDUCTION OF HIP;  Surgeon: Renette Butters, MD;  Location: [place];  Service: Orthopedics;  Laterality: Right;  . Esophagogastroduodenoscopy (egd) with propofol N/A 09/26/2014    Procedure: ESOPHAGOGASTRODUODENOSCOPY (EGD) WITH PROPOFOL;  Surgeon: Jerene Bears, MD;  Location: [facility] ENDOSCOPY;  Service: Endoscopy;  Laterality: N/A;  . Colonoscopy N/A 10/01/2014    Procedure: COLONOSCOPY;  Surgeon: Jerene Bears, MD;  Location: [facility] ENDOSCOPY;  Service: Endoscopy;  Laterality: N/A;  . Partial colectomy N/A 10/05/2014    Procedure: SUBTOTAL COLECTOMY WITH ILEORECTAL ANASTOMOSIS;  Surgeon: Donnie Mesa, MD;  Location: [place];  Service: General;  Laterality: N/A;  . Flexible sigmoidoscopy N/A 11/07/2014    Procedure: FLEXIBLE SIGMOIDOSCOPY;  Surgeon: Jerene Bears, MD;  Location: [facility] ENDOSCOPY;  Service: Endoscopy;  Laterality: N/A;  . Gastric resection    . Flexible sigmoidoscopy N/A 07/30/2015    Procedure: FLEXIBLE SIGMOIDOSCOPY;  Surgeon: Doran Stabler, MD;  Location: [facility] ENDOSCOPY;  Service: Endoscopy;  Laterality: N/A;  . Transanal excision of rectal mass N/A 08/03/2015    Procedure: TRANSANAL MINIMALLY INVASIVE SURGERY TO RESECT RECTAL POLYP;  Surgeon: [name], MD;  Location: [facility] ORS;  Service: General;  Laterality: N/A;  . Flexible sigmoidoscopy N/A 08/03/2015    Procedure: FLEXIBLE SIGMOIDOSCOPY;  Surgeon: [name], MD;  Location: [facility] ORS;  Service: General;  Laterality: N/A;   Family History  Problem Relation Age of Onset  . Colon cancer Mother    Social History  Substance Use Topics  . Smoking status: Current Every Day Smoker -- 0.50 packs/day for 66 years    Types: Cigarettes  . Smokeless tobacco: Former Systems developer    Types: Snuff, Chew     Comment: 05/27/2013 "haven't used chew or snuff in ~ 30 yr"  . Alcohol Use: 0.0 oz/week    0 Standard drinks or  equivalent per week     Comment: licqor monthly    Review of Systems  Constitutional: Negative for fever, activity change and appetite change.  Respiratory: Positive for cough and shortness of breath.        Chronic SOB, cough - unchanged from baseline   Cardiovascular: Positive for chest pain (chronic, unchanged ).  Gastrointestinal: Positive for abdominal pain (chronic, unchanged ). Negative for vomiting.  Musculoskeletal: Negative for myalgias and neck pain.  Skin: Negative for rash.  Psychiatric/Behavioral: Negative for self-injury.      Allergies  Review of patient's allergies indicates no known allergies.  Home Medications   Prior to Admission medications   Medication Sig Start Date End Date Taking? Authorizing Provider  albuterol (PROVENTIL HFA;VENTOLIN HFA) 108 (90 Base) MCG/ACT inhaler Inhale 2 puffs into the lungs every 4 (four) hours as needed for wheezing or shortness of breath. 08/07/15   Venetia Maxon Rama, MD  aspirin EC 81 MG tablet Take 81 mg by mouth daily.    Historical Provider, MD  carvedilol (COREG) 6.25 MG tablet Take 1 tablet (6.25 mg total) by mouth 2 (two) times daily with a meal. 09/23/15   Blanchie Dessert, MD  diazepam (VALIUM) 5 MG tablet Take 5 mg by mouth every 12 (twelve) hours as needed for anxiety.    Historical Provider, MD  diphenoxylate-atropine (LOMOTIL) 2.5-0.025 MG tablet Take 1 tablet by mouth 4 (four) times daily as needed for diarrhea or loose stools. 09/25/15   Tanna Furry, MD  feeding supplement, ENSURE ENLIVE, (ENSURE ENLIVE) LIQD Take 237 mLs by mouth 2 (two) times daily between meals. 08/07/15   Venetia Maxon Rama, MD  oxyCODONE-acetaminophen (PERCOCET/ROXICET) 5-325 MG tablet Take 1 tablet by mouth every 6 (six) hours as needed for severe pain.    Historical Provider, MD   BP 125/65 mmHg  Pulse 84  Temp(Src) 98.9 F (37.2 C) (Oral)  Resp 18  SpO2 98% Physical Exam  Constitutional: He appears well-developed and well-nourished. No  distress.  HENT:  Head: Normocephalic and atraumatic.  Neck: Neck supple.  Cardiovascular: Normal rate and regular rhythm.   Pulmonary/Chest: Effort normal. No respiratory distress. He has wheezes. He has no rales.  Abdominal: Soft. There is no rebound and no guarding.  Neurological: He is alert.  Skin: He is not diaphoretic.  Nursing note and vitals reviewed.   ED Course  Procedures (including critical care time) Labs Review Labs Reviewed - No data to display  Imaging Review No results found. I have personally reviewed and evaluated these images and lab results as part of my medical decision-making.   EKG Interpretation None       3:27 PM Pt found going through drawers and putting a handful of packets in his pocket.  MDM   Final diagnoses:  Chronic chest pain  Afebrile, nontoxic patient well known to the emergency department with chronic chest pain, seen in ED 3 times yesterday and twice today, 51 visits over 6 months.  Pt has ED care plan.  Presents today with his typical chest pain, states it got worse after smoking outside of the ED after his discharge this morning.  Per patient this is exactly the same as his chronic pain. He was active in the room in the ED, eating cabbage, drinking drinks, going through the drawers and taking things.  He did not appear to be in pain.  Was wheezing slightly on exam.  Albuterol inhaler given.   D/C home.    PCP, cardiology follow up.     Clayton Bibles, PA-C 10/25/15 [place], MD 10/30/15 [phone]

## 2015-10-25 NOTE — ED Notes (Signed)
Pt ambulated out of hospital

## 2015-10-26 ENCOUNTER — Emergency Department (HOSPITAL_COMMUNITY)
Admission: EM | Admit: 2015-10-26 | Discharge: 2015-10-26 | Disposition: A | Discharge status: Home / Self Care | Payer: Medicare Other | Attending: Emergency Medicine | Admitting: Emergency Medicine

## 2015-10-26 ENCOUNTER — Emergency Department (HOSPITAL_COMMUNITY): Payer: Medicare Other

## 2015-10-26 ENCOUNTER — Encounter (HOSPITAL_COMMUNITY): Payer: Self-pay | Admitting: Emergency Medicine

## 2015-10-26 DIAGNOSIS — Z8701 Personal history of pneumonia (recurrent): Secondary | ICD-10-CM | POA: Insufficient documentation

## 2015-10-26 DIAGNOSIS — Z85038 Personal history of other malignant neoplasm of large intestine: Secondary | ICD-10-CM | POA: Diagnosis not present

## 2015-10-26 DIAGNOSIS — Z8781 Personal history of (healed) traumatic fracture: Secondary | ICD-10-CM | POA: Insufficient documentation

## 2015-10-26 DIAGNOSIS — Z79899 Other long term (current) drug therapy: Secondary | ICD-10-CM | POA: Diagnosis not present

## 2015-10-26 DIAGNOSIS — R0602 Shortness of breath: Secondary | ICD-10-CM | POA: Insufficient documentation

## 2015-10-26 DIAGNOSIS — Z87438 Personal history of other diseases of male genital organs: Secondary | ICD-10-CM | POA: Insufficient documentation

## 2015-10-26 DIAGNOSIS — I208 Other forms of angina pectoris: Secondary | ICD-10-CM | POA: Insufficient documentation

## 2015-10-26 DIAGNOSIS — Z8719 Personal history of other diseases of the digestive system: Secondary | ICD-10-CM | POA: Diagnosis not present

## 2015-10-26 DIAGNOSIS — I482 Chronic atrial fibrillation: Secondary | ICD-10-CM | POA: Diagnosis not present

## 2015-10-26 DIAGNOSIS — R05 Cough: Secondary | ICD-10-CM | POA: Insufficient documentation

## 2015-10-26 DIAGNOSIS — Z7982 Long term (current) use of aspirin: Secondary | ICD-10-CM | POA: Diagnosis not present

## 2015-10-26 DIAGNOSIS — I10 Essential (primary) hypertension: Secondary | ICD-10-CM | POA: Diagnosis not present

## 2015-10-26 DIAGNOSIS — R079 Chest pain, unspecified: Secondary | ICD-10-CM | POA: Diagnosis not present

## 2015-10-26 DIAGNOSIS — Z8572 Personal history of non-Hodgkin lymphomas: Secondary | ICD-10-CM | POA: Diagnosis not present

## 2015-10-26 DIAGNOSIS — F17210 Nicotine dependence, cigarettes, uncomplicated: Secondary | ICD-10-CM | POA: Diagnosis not present

## 2015-10-26 DIAGNOSIS — Z8639 Personal history of other endocrine, nutritional and metabolic disease: Secondary | ICD-10-CM | POA: Insufficient documentation

## 2015-10-26 DIAGNOSIS — Z8739 Personal history of other diseases of the musculoskeletal system and connective tissue: Secondary | ICD-10-CM | POA: Insufficient documentation

## 2015-10-26 DIAGNOSIS — R062 Wheezing: Secondary | ICD-10-CM | POA: Diagnosis not present

## 2015-10-26 NOTE — ED Provider Notes (Signed)
CSN: [id number]     Arrival date & time 10/26/15  1947 History   First MD Initiated Contact with Patient 10/26/15 1953     Chief Complaint  Patient presents with  . Chest Pain    HPI Comments: Frequent utilizer of the emergency department with recurrent visits for exact same left-sided chest pain that she appears on palpation and consistent with chronic chest wall pain. History of cardiomyopathy with pacemaker in place. Denies firing. Discharged from jail this AM due to trespassing.  Frequently feels multiple items from this institution during her past visits via review of charts. No leg swelling, hemoptysis, pleurisy, sputum, fevers, shortness of breath. Has inhaler present and has been using.  Patient is a 70 y.o. male presenting with chest pain. The history is provided by the patient.  Chest Pain Pain location:  L chest Pain quality: aching   Pain radiates to:  L arm Pain radiates to the back: no   Onset quality:  Gradual Duration:  12 hours Timing:  Constant Chronicity:  Chronic (same left sided chest pain he gets every day) Context: at rest   Context comment:  Did fall recently onto chest Associated symptoms: cough and shortness of breath   Associated symptoms: no abdominal pain, no back pain, no fever, no nausea, no numbness, no palpitations and not vomiting     Past Medical History  Diagnosis Date  . Depression   . Fall at nursing home     December 2014  . History of migraine   . Gout attack   . Complete heart block ([place])     St. Jude pacemaker - followed by Dr. Lovena Le  . Chronic atrial fibrillation ([place])   . HLD (hyperlipidemia)   . GIB (gastrointestinal bleeding)   . BPH (benign prostatic hyperplasia)   . Essential hypertension   . Gastric ulcer   . Non Hodgkin's lymphoma ([place])   . Tubular adenoma of colon   . Aspiration pneumonia ([place]) 10/07/2014  . 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 Atypical angina ([place])  12/01/2014  . BLOCK, AV, COMPLETE 12/24/2006    Annotation: Pacer placed Qualifier: Diagnosis of  By: Hilma Favors  DO, Beth    . 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 Fracture femur, cervicotrochanteric ([place]) 05/27/2013   Past Surgical History  Procedure Laterality Date  . Pacemaker generator change      Dr. Lovena Le  . Appendectomy    . Cholecystectomy    . Open reduction of hip Right 05/28/2013    Procedure: OPEN REDUCTION OF HIP;  Surgeon: Renette Butters, MD;  Location: [place];  Service: Orthopedics;  Laterality: Right;  . Esophagogastroduodenoscopy (egd) with propofol N/A 09/26/2014    Procedure: ESOPHAGOGASTRODUODENOSCOPY (EGD) WITH PROPOFOL;  Surgeon: Jerene Bears, MD;  Location: [facility] ENDOSCOPY;  Service: Endoscopy;  Laterality: N/A;  . Colonoscopy N/A 10/01/2014    Procedure: COLONOSCOPY;  Surgeon: Jerene Bears, MD;  Location: [facility] ENDOSCOPY;  Service: Endoscopy;  Laterality: N/A;  . Partial colectomy N/A 10/05/2014    Procedure: SUBTOTAL COLECTOMY WITH ILEORECTAL ANASTOMOSIS;  Surgeon: Donnie Mesa, MD;  Location: [place];  Service: General;  Laterality: N/A;  . Flexible sigmoidoscopy N/A 11/07/2014    Procedure: FLEXIBLE SIGMOIDOSCOPY;  Surgeon: Jerene Bears, MD;  Location: [facility] ENDOSCOPY;  Service: Endoscopy;  Laterality: N/A;  .  Gastric resection    . Flexible sigmoidoscopy N/A 07/30/2015    Procedure: FLEXIBLE SIGMOIDOSCOPY;  Surgeon: Doran Stabler, MD;  Location: [facility] ENDOSCOPY;  Service: Endoscopy;  Laterality: N/A;  . Transanal excision of rectal mass N/A 08/03/2015    Procedure: TRANSANAL MINIMALLY INVASIVE SURGERY TO RESECT RECTAL POLYP;  Surgeon: [name], MD;  Location: [facility]S;  Service: General;  Laterality: N/A;  . Flexible sigmoidoscopy N/A 08/03/2015    Procedure: FLEXIBLE SIGMOIDOSCOPY;  Surgeon: [name], MD;  Location: [facility] ORS;  Service: General;  Laterality: N/A;   Family  History  Problem Relation Age of Onset  . Colon cancer Mother    Social History  Substance Use Topics  . Smoking status: Current Every Day Smoker -- 0.50 packs/day for 66 years    Types: Cigarettes  . Smokeless tobacco: Former Systems developer    Types: Snuff, Chew     Comment: 05/27/2013 "haven't used chew or snuff in ~ 30 yr"  . Alcohol Use: 0.0 oz/week    0 Standard drinks or equivalent per week     Comment: licqor monthly    Review of Systems  Constitutional: Negative for fever and chills.  Respiratory: Positive for cough, shortness of breath and wheezing.   Cardiovascular: Positive for chest pain. Negative for palpitations and leg swelling.  Gastrointestinal: Negative for nausea, vomiting and abdominal pain.  Genitourinary: Negative for flank pain.  Musculoskeletal: Negative for back pain.  Skin: Negative for rash and wound.  Neurological: Negative for syncope and numbness.  Psychiatric/Behavioral: Negative for confusion.  All other systems reviewed and are negative.   Allergies  Review of patient's allergies indicates no known allergies.  Home Medications   Prior to Admission medications   Medication Sig Start Date End Date Taking? Authorizing Provider  albuterol (PROVENTIL HFA;VENTOLIN HFA) 108 (90 Base) MCG/ACT inhaler Inhale 2 puffs into the lungs every 4 (four) hours as needed for wheezing or shortness of breath. 08/07/15  Yes Venetia Maxon Rama, MD  aspirin EC 81 MG tablet Take 81 mg by mouth daily.   Yes Historical Provider, MD  carvedilol (COREG) 6.25 MG tablet Take 1 tablet (6.25 mg total) by mouth 2 (two) times daily with a meal. 09/23/15  Yes Blanchie Dessert, MD  diazepam (VALIUM) 5 MG tablet Take 5 mg by mouth every 12 (twelve) hours as needed for anxiety.   Yes Historical Provider, MD  diphenoxylate-atropine (LOMOTIL) 2.5-0.025 MG tablet Take 1 tablet by mouth 4 (four) times daily as needed for diarrhea or loose stools. 09/25/15  Yes Tanna Furry, MD  feeding supplement,  ENSURE ENLIVE, (ENSURE ENLIVE) LIQD Take 237 mLs by mouth 2 (two) times daily between meals. 08/07/15  Yes Christina P Rama, MD  omeprazole (PRILOSEC) 40 MG capsule Take 40 mg by mouth daily.   Yes Historical Provider, MD  oxyCODONE-acetaminophen (PERCOCET/ROXICET) 5-325 MG tablet Take 1 tablet by mouth every 6 (six) hours as needed for severe pain.   Yes Historical Provider, MD   There were no vitals taken for this visit. Physical Exam  Constitutional: He is oriented to person, place, and time. He appears well-developed and well-nourished. No distress.  Comfortable. Malodorous.   HENT:  Head: Normocephalic and atraumatic.  Nose: Nose normal.  Eyes: Conjunctivae are normal.  Neck: Normal range of motion. Neck supple. No tracheal deviation present.  Cardiovascular: Normal rate, regular rhythm and normal heart sounds.   No murmur heard. Strong radials equal  Pulmonary/Chest: Effort normal. No respiratory distress. He has wheezes (end  exp diffusely). He has no rales. He exhibits tenderness (left sided, reproduces pain exactly).  Abdominal: Soft. Bowel sounds are normal. He exhibits no distension and no mass. There is no tenderness.  Musculoskeletal: Normal range of motion. He exhibits no edema.  No lower extremity edema, calf tenderness, warmth, erythema or palpable cords   Neurological: He is alert and oriented to person, place, and time.  Skin: Skin is warm and dry. No rash noted.  Psychiatric: He has a normal mood and affect.  Nursing note and vitals reviewed.   ED Course  Procedures (including critical care time) Labs Review Labs Reviewed - No data to display  Imaging Review Dg Chest 2 View  10/26/2015  CLINICAL DATA:  70 year old male with left-sided chest pain and shortness of breath EXAM: CHEST  2 VIEW COMPARISON:  Chest radiograph dated 05/14 10/22/2015 FINDINGS: Two views of the chest demonstrate mild emphysematous changes of the lungs. There is no focal consolidation. No  pleural effusion or pneumothorax. There is mild moderate cardiomegaly. Right pectoral pacemaker device noted. No acute osseous pathology. IMPRESSION: No active cardiopulmonary disease. Cardiomegaly. Electronically Signed   By: Anner Crete M.D.   On: 10/26/2015 20:31   I have personally reviewed and evaluated these images and lab results as part of my medical decision-making.   EKG Interpretation   Date/Time:  Thursday Oct 26 2015 20:00:08 EDT Ventricular Rate:  107 PR Interval:    QRS Duration: 99 QT Interval:  349 QTC Calculation: 466 R Axis:   -86 Text Interpretation:  Atrial fibrillation Ventricular premature complex  Nonspecific T wave abnormality Confirmed by Ashok Cordia  MD, Lennette Bihari ([id number]) on  10/26/2015 8:03:59 PM      MDM   Final diagnoses:  Chest pain, unspecified chest pain type    Reviewed past medical history frequent ED visits. Here for chronic left chest wall pain without any new features from prior visits. No hypoxia, no s/s DVT, no hemoptysis, doubt PE, HPI not typical.  Doubt ACS, has had multiple prior ED visits recently with neg biomarkers.  Some wheezes diffusely, no resp sx, HPI not cw COPD exacerbation.  Did have fall, will check CXR.  CXR wo PTX or PNA or rib fx apparent. EKG without ischemic changes; unchanged compared to prior. Cleared for dc home.  I have very low suspicion for ACS. Medically screened, very very low suspicion for emergent pathology.  F/u with cardiology recommended    Tammy Sours, MD 10/26/15 [id number]  Lajean Saver, MD 10/26/15 2051

## 2015-10-26 NOTE — ED Notes (Signed)
Pt returned to room D34 with transporter from xray

## 2015-10-26 NOTE — ED Notes (Signed)
Patient transported to X-ray 

## 2015-10-26 NOTE — ED Notes (Signed)
Pt refused to sign for discharge papers

## 2015-10-26 NOTE — Discharge Instructions (Signed)
Chest Wall Pain °Chest wall pain is pain in or around the bones and muscles of your chest. Sometimes, an injury causes this pain. Sometimes, the cause may not be known. This pain may take several weeks or longer to get better. °HOME CARE °Pay attention to any changes in your symptoms. Take these actions to help with your pain: °· Rest as told by your doctor. °· Avoid activities that cause pain. Try not to use your chest, belly (abdominal), or side muscles to lift heavy things. °· If directed, apply ice to the painful area: °¨ Put ice in a plastic bag. °¨ Place a towel between your skin and the bag. °¨ Leave the ice on for 20 minutes, 2-3 times per day. °· Take over-the-counter and prescription medicines only as told by your doctor. °· Do not use tobacco products, including cigarettes, chewing tobacco, and e-cigarettes. If you need help quitting, ask your doctor. °· Keep all follow-up visits as told by your doctor. This is important. °GET HELP IF: °· You have a fever. °· Your chest pain gets worse. °· You have new symptoms. °GET HELP RIGHT AWAY IF: °· You feel sick to your stomach (nauseous) or you throw up (vomit). °· You feel sweaty or light-headed. °· You have a cough with phlegm (sputum) or you cough up blood. °· You are short of breath. °  °This information is not intended to replace advice given to you by your health care provider. Make sure you discuss any questions you have with your health care provider. °  °Document Released: 11/13/2007 Document Revised: 02/15/2015 Document Reviewed: 08/22/2014 °Elsevier Interactive Patient Education ©2016 [employer] ° °

## 2015-10-26 NOTE — Care Management (Signed)
Patient has had 9 ED visits in the past 6 months only resulting in 2 admissions, patient has had up to 3 ED visits within hours apart. ED CM met with patient at bedside to discuss Primary Care and follow up in the most appropriate setting.  Patient reported hat he is now residing at [facility].  CM contacted MG and was told that patient was discharge on date of Admission approx 3 weeks ago.  Patient states, he is originally from the [place] area but  denies having any family. Patient was given resources from the [facility] and was directed to go in the am and see CM, patient verbalized understanding teach back done. Patient given a bus pass for transportation to [facility]. No additional questions or concerns verbalized. No further ED CM needs identified.

## 2015-10-26 NOTE — ED Notes (Signed)
Pt presents to ER with [facility] for Left sided CP that began at 8p, pt reports pain radiates down LEFT arm; rates pain as 9/10, was given 324mg  ASA and 2 SL NTG via EMS and pain is now 4/10

## 2015-10-27 ENCOUNTER — Emergency Department (HOSPITAL_COMMUNITY)
Admission: EM | Admit: 2015-10-27 | Discharge: 2015-10-28 | Disposition: A | Discharge status: Home / Self Care | Payer: Medicare Other | Attending: Emergency Medicine | Admitting: Emergency Medicine

## 2015-10-27 ENCOUNTER — Encounter (HOSPITAL_COMMUNITY): Payer: Self-pay

## 2015-10-27 ENCOUNTER — Other Ambulatory Visit: Payer: Self-pay

## 2015-10-27 ENCOUNTER — Emergency Department (HOSPITAL_COMMUNITY)
Admission: EM | Admit: 2015-10-27 | Discharge: 2015-10-27 | Disposition: A | Discharge status: Home / Self Care | Payer: Medicare Other | Source: Home / Self Care

## 2015-10-27 DIAGNOSIS — Z79899 Other long term (current) drug therapy: Secondary | ICD-10-CM | POA: Insufficient documentation

## 2015-10-27 DIAGNOSIS — Z8572 Personal history of non-Hodgkin lymphomas: Secondary | ICD-10-CM | POA: Diagnosis not present

## 2015-10-27 DIAGNOSIS — R079 Chest pain, unspecified: Secondary | ICD-10-CM | POA: Insufficient documentation

## 2015-10-27 DIAGNOSIS — Z8659 Personal history of other mental and behavioral disorders: Secondary | ICD-10-CM | POA: Insufficient documentation

## 2015-10-27 DIAGNOSIS — R197 Diarrhea, unspecified: Secondary | ICD-10-CM | POA: Diagnosis not present

## 2015-10-27 DIAGNOSIS — Z8719 Personal history of other diseases of the digestive system: Secondary | ICD-10-CM | POA: Insufficient documentation

## 2015-10-27 DIAGNOSIS — I10 Essential (primary) hypertension: Secondary | ICD-10-CM | POA: Insufficient documentation

## 2015-10-27 DIAGNOSIS — J441 Chronic obstructive pulmonary disease with (acute) exacerbation: Secondary | ICD-10-CM | POA: Diagnosis not present

## 2015-10-27 DIAGNOSIS — Z8639 Personal history of other endocrine, nutritional and metabolic disease: Secondary | ICD-10-CM | POA: Insufficient documentation

## 2015-10-27 DIAGNOSIS — Z86018 Personal history of other benign neoplasm: Secondary | ICD-10-CM | POA: Diagnosis not present

## 2015-10-27 DIAGNOSIS — Z87828 Personal history of other (healed) physical injury and trauma: Secondary | ICD-10-CM | POA: Insufficient documentation

## 2015-10-27 DIAGNOSIS — R0789 Other chest pain: Secondary | ICD-10-CM

## 2015-10-27 DIAGNOSIS — I209 Angina pectoris, unspecified: Secondary | ICD-10-CM | POA: Insufficient documentation

## 2015-10-27 DIAGNOSIS — Z7982 Long term (current) use of aspirin: Secondary | ICD-10-CM | POA: Insufficient documentation

## 2015-10-27 DIAGNOSIS — Z87438 Personal history of other diseases of male genital organs: Secondary | ICD-10-CM | POA: Insufficient documentation

## 2015-10-27 DIAGNOSIS — Z8701 Personal history of pneumonia (recurrent): Secondary | ICD-10-CM | POA: Insufficient documentation

## 2015-10-27 DIAGNOSIS — F17210 Nicotine dependence, cigarettes, uncomplicated: Secondary | ICD-10-CM | POA: Diagnosis not present

## 2015-10-27 DIAGNOSIS — Z8781 Personal history of (healed) traumatic fracture: Secondary | ICD-10-CM | POA: Insufficient documentation

## 2015-10-27 DIAGNOSIS — R0602 Shortness of breath: Secondary | ICD-10-CM | POA: Diagnosis present

## 2015-10-27 DIAGNOSIS — Z8739 Personal history of other diseases of the musculoskeletal system and connective tissue: Secondary | ICD-10-CM | POA: Insufficient documentation

## 2015-10-27 MED ORDER — PREDNISONE 20 MG PO TABS: 60.0000 mg | ORAL_TABLET | Freq: Every day | ORAL | Status: AC

## 2015-10-27 MED ORDER — IPRATROPIUM-ALBUTEROL 0.5-2.5 (3) MG/3ML IN SOLN
3.0000 mL | Freq: Once | RESPIRATORY_TRACT | Status: DC
  Filled 2015-10-27: qty 3

## 2015-10-27 MED ORDER — ALBUTEROL SULFATE HFA 108 (90 BASE) MCG/ACT IN AERS
2.0000 | INHALATION_SPRAY | Freq: Once | RESPIRATORY_TRACT | Status: AC
  Administered 2015-10-28: 2 via RESPIRATORY_TRACT
  Filled 2015-10-27: qty 6.7

## 2015-10-27 MED ORDER — ACETAMINOPHEN 500 MG PO TABS
1000.0000 mg | ORAL_TABLET | Freq: Once | ORAL | Status: AC
  Administered 2015-10-28: 1000 mg via ORAL
  Filled 2015-10-27: qty 2

## 2015-10-27 MED ORDER — PREDNISONE 20 MG PO TABS
60.0000 mg | ORAL_TABLET | Freq: Once | ORAL | Status: AC
  Administered 2015-10-28: 60 mg via ORAL
  Filled 2015-10-27: qty 3

## 2015-10-27 NOTE — ED Notes (Addendum)
Unable to locate pt to take to treatment room.  Pt has been walking outside to bus stop to smoke and digging in trash can for cigarette butts for the past hour.  Pt has been given money by visitors sitting in waiting area as well as chips and a bottle of water.  Pt has also been given graham crackers and water per request from our staff.

## 2015-10-27 NOTE — ED Provider Notes (Addendum)
By signing my name below, I, Stephania Fragmin, attest that this documentation has been prepared under the direction and in the presence of [place], DO. Electronically Signed: Stephania Fragmin, ED Scribe. 10/27/2015. 11:23 PM.   TIME SEEN: 11:13 PM  CHIEF COMPLAINT: Chest pain  HPI:  HPI Comments: [name] is a 70 y.o. male with a history of atrial fibrillation, cardiomyopathy with pacemaker in place, who presents to the Emergency Department complaining of recurrent, sharp center chest pain that began 2 hours ago. He notes associated SOB and wheezing. Has had dry cough which is unchanged from baseline.  No fever. No nausea, vomiting.  He also reports diarrhea, which he states is baseline for him. Per medical records, patient has been seen multiple times in the ED for the same complaint. He was discharged from the ED here a few hours ago for similar chest pain, with no noted changes since. No treatments or modifying factors were noted. He denies fever. Patient states he stays at a nursing home but suspect that he is truly homeless.  States that this is no different than is chronic pain.    ROS: See HPI Constitutional: no fever  Eyes: no drainage  ENT: no runny nose   Cardiovascular:  Positive for chest pain  Resp: positive for SOB and wheezes GI: no vomiting, positive for diarrhea GU: no dysuria Integumentary: no rash  Allergy: no hives  Musculoskeletal: no leg swelling  Neurological: no slurred speech ROS otherwise negative  PAST MEDICAL HISTORY/PAST SURGICAL HISTORY:  Past Medical History  Diagnosis Date  . Depression   . Fall at nursing home     December 2014  . History of migraine   . Gout attack   . Complete heart block ([place])     St. Jude pacemaker - followed by Dr. Lovena Le  . Chronic atrial fibrillation ([place])   . HLD (hyperlipidemia)   . GIB (gastrointestinal bleeding)   . BPH (benign prostatic hyperplasia)   . Essential hypertension   . Gastric ulcer   . Non Hodgkin's  lymphoma ([place])   . Tubular adenoma of colon   . Aspiration pneumonia ([place]) 10/07/2014  . 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 Atypical angina ([place]) 12/01/2014  . BLOCK, AV, COMPLETE 12/24/2006    Annotation: Pacer placed Qualifier: Diagnosis of  By: Hilma Favors  DO, Beth    . 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 Fracture femur, cervicotrochanteric ([place]) 05/27/2013    MEDICATIONS:  Prior to Admission medications   Medication Sig Start Date End Date Taking? Authorizing Provider  albuterol (PROVENTIL HFA;VENTOLIN HFA) 108 (90 Base) MCG/ACT inhaler Inhale 2 puffs into the lungs every 4 (four) hours as needed for wheezing or shortness of breath. 08/07/15   Venetia Maxon Rama, MD  aspirin EC 81 MG tablet Take 81 mg by mouth daily.    Historical Provider, MD  carvedilol (COREG) 6.25 MG tablet Take 1 tablet (6.25 mg total) by mouth 2 (two) times daily with a meal. 09/23/15   Blanchie Dessert, MD  diazepam (VALIUM) 5 MG tablet Take 5 mg by mouth every 12 (twelve) hours as needed for anxiety.    Historical Provider, MD  diphenoxylate-atropine (LOMOTIL) 2.5-0.025 MG tablet Take 1 tablet by mouth 4 (four) times daily as needed for diarrhea or loose stools. 09/25/15  Tanna Furry, MD  feeding supplement, ENSURE ENLIVE, (ENSURE ENLIVE) LIQD Take 237 mLs by mouth 2 (two) times daily between meals. 08/07/15   Venetia Maxon Rama, MD  omeprazole (PRILOSEC) 40 MG capsule Take 40 mg by mouth daily.    Historical Provider, MD  oxyCODONE-acetaminophen (PERCOCET/ROXICET) 5-325 MG tablet Take 1 tablet by mouth every 6 (six) hours as needed for severe pain.    Historical Provider, MD    ALLERGIES:  No Known Allergies  SOCIAL HISTORY:  Social History  Substance Use Topics  . Smoking status: Current Every Day Smoker -- 0.50 packs/day for 66 years    Types: Cigarettes  . Smokeless  tobacco: Former Systems developer    Types: Snuff, Chew     Comment: 05/27/2013 "haven't used chew or snuff in ~ 30 yr"  . Alcohol Use: 0.0 oz/week    0 Standard drinks or equivalent per week     Comment: licqor monthly    FAMILY HISTORY: Family History  Problem Relation Age of Onset  . Colon cancer Mother     EXAM: BP 153/65 mmHg  Pulse 87  Temp(Src) 98.7 F (37.1 C) (Oral)  Resp 14  SpO2 98% CONSTITUTIONAL: Alert and oriented and responds appropriately to questions. Chronically ill-appearing, thin, with no distress. Afebrile. Cooperative, calm, pleasant HEAD: Normocephalic EYES: Conjunctivae clear, PERRL ENT: normal nose; no rhinorrhea; moist mucous membranes NECK: Supple, no meningismus, no LAD  CARD: Irregularly irregular; S1 and S2 appreciated; no murmurs, no clicks, no rubs, no gallops CHEST: TTP over the left chest wall, which reproduces patient's pain, without crepitus, ecchymosis, deformity, or rash RESP: Normal chest excursion without splinting or tachypnea; breath sounds qual bilaterally; mild scattered expiratory wheezes, no rhonchi, no rales, no hypoxia or respiratory distress, speaking full sentences; no increased work of breathing ABD/GI: Normal bowel sounds; non-distended; soft, non-tender, no rebound, no guarding, no peritoneal signs BACK:  The back appears normal and is non-tender to palpation, there is no CVA tenderness EXT: Normal ROM in all joints; non-tender to palpation; no edema; normal capillary refill; no cyanosis, no calf tenderness or swelling    SKIN: Normal color for age and race; warm; no rash NEURO: Moves all extremities equally, sensation to light touch intact diffusely, cranial nerves II through XII intact PSYCH: The patient's mood and manner are appropriate. Poor grooming.   EKG Interpretation  Date/Time:  Friday Oct 27 2015 21:50:31 EDT Ventricular Rate:  100 PR Interval:    QRS Duration: 98 QT Interval:  364 QTC Calculation: 469 R Axis:   128 Text  Interpretation:  Atrial fibrillation Right axis deviation Incomplete right bundle branch block Right ventricular hypertrophy Cannot rule out Anterior infarct , age undetermined T wave abnormality, consider inferolateral ischemia Abnormal ECG Confirmed by COOK  MD, BRIAN ([id number]) on 10/27/2015 10:12:13 PM        MEDICAL DECISION MAKING: Pt here with chronic chest pain. Reproducible with palpation of his chest wall. Has had this pain for several years. Recently had a negative chest x-ray on May 18. Has had many negative troponins. Has a care plan. Patient is currently wheezing. We'll give albuterol inhaler, prednisone. We'll give him Tylenol for his chest wall pain. Anticipate that he will be discharged. Low suspicion for ACS, dissection, PE.  ED PROGRESS: Pt had a bowel movement in the bed and is pushing the stool into the floor. Security at bedside. Patient will receive his albuterol inhaler, prednisone and be discharged. We'll discharge with steroid taper. Have given him outpatient resources.  He has no current psychiatric safety concerns.  Discussed return precautions.    At this time, I do not feel there is any life-threatening condition present. I have reviewed and discussed all results (EKG, imaging, lab, urine as appropriate), exam findings with patient. I have reviewed nursing notes and appropriate previous records.  I feel the patient is safe to be discharged home without further emergent workup. Discussed usual and customary return precautions. Patient and family (if present) verbalize understanding and are comfortable with this plan.  Patient will follow-up with their primary care provider. If they do not have a primary care provider, information for follow-up has been provided to them. All questions have been answered.    I personally performed the services described in this documentation, which was scribed in my presence. The recorded information has been reviewed and is  accurate.   [place], DO 10/28/15 [place], DO 10/28/15 [id number]

## 2015-10-27 NOTE — Discharge Instructions (Signed)
Chronic Obstructive Pulmonary Disease Exacerbation Chronic obstructive pulmonary disease (COPD) is a common lung condition in which airflow from the lungs is limited. COPD is a general term that can be used to describe many different lung problems that limit airflow, including chronic bronchitis and emphysema. COPD exacerbations are episodes when breathing symptoms become much worse and require extra treatment. Without treatment, COPD exacerbations can be life threatening, and frequent COPD exacerbations can cause further damage to your lungs. CAUSES  Respiratory infections.  Exposure to smoke.  Exposure to air pollution, chemical fumes, or dust. Sometimes there is no apparent cause or trigger. RISK FACTORS  Smoking cigarettes.  Older age.  Frequent prior COPD exacerbations. SIGNS AND SYMPTOMS  Increased coughing.  Increased thick spit (sputum) production.  Increased wheezing.  Increased shortness of breath.  Rapid breathing.  Chest tightness. DIAGNOSIS Your medical history, a physical exam, and tests will help your health care provider make a diagnosis. Tests may include:  A chest X-ray.  Basic lab tests.  Sputum testing.  An arterial blood gas test. TREATMENT Depending on the severity of your COPD exacerbation, you may need to be admitted to a hospital for treatment. Some of the treatments commonly used to treat COPD exacerbations are:   Antibiotic medicines.  Bronchodilators. These are drugs that expand the air passages. They may be given with an inhaler or nebulizer. Spacer devices may be needed to help improve drug delivery.  Corticosteroid medicines.  Supplemental oxygen therapy.  Airway clearing techniques, such as noninvasive ventilation (NIV) and positive expiratory pressure (PEP). These provide respiratory support through a mask or other noninvasive device. HOME CARE INSTRUCTIONS  Do not smoke. Quitting smoking is very important to prevent COPD from  getting worse and exacerbations from happening as often.  Avoid exposure to all substances that irritate the airway, especially to tobacco smoke.  If you were prescribed an antibiotic medicine, finish it all even if you start to feel better.  Take all medicines as directed by your health care provider.It is important to use correct technique with inhaled medicines.  Drink enough fluids to keep your urine clear or pale yellow (unless you have a medical condition that requires fluid restriction).  Use a cool mist vaporizer. This makes it easier to clear your chest when you cough.  If you have a home nebulizer and oxygen, continue to use them as directed.  Maintain all necessary vaccinations to prevent infections.  Exercise regularly.  Eat a healthy diet.  Keep all follow-up appointments as directed by your health care provider. SEEK IMMEDIATE MEDICAL CARE IF:  You have worsening shortness of breath.  You have trouble talking.  You have severe chest pain.  You have blood in your sputum.  You have a fever.  You have weakness, vomit repeatedly, or faint.  You feel confused.  You continue to get worse. MAKE SURE YOU:  Understand these instructions.  Will watch your condition.  Will get help right away if you are not doing well or get worse.   This information is not intended to replace advice given to you by your health care provider. Make sure you discuss any questions you have with your health care provider.   Document Released: 03/24/2007 Document Revised: 06/17/2014 Document Reviewed: 01/29/2013 Elsevier Interactive Patient Education 2016 Elsevier Inc.  Chest Wall Pain Chest wall pain is pain in or around the bones and muscles of your chest. Sometimes, an injury causes this pain. Sometimes, the cause may not be known. This pain may  take several weeks or longer to get better. HOME CARE INSTRUCTIONS  Pay attention to any changes in your symptoms. Take these actions  to help with your pain:   Rest as told by your health care provider.   Avoid activities that cause pain. These include any activities that use your chest muscles or your abdominal and side muscles to lift heavy items.   If directed, apply ice to the painful area:  Put ice in a plastic bag.  Place a towel between your skin and the bag.  Leave the ice on for 20 minutes, 2-3 times per day.  Take over-the-counter and prescription medicines only as told by your health care provider.  Do not use tobacco products, including cigarettes, chewing tobacco, and e-cigarettes. If you need help quitting, ask your health care provider.  Keep all follow-up visits as told by your health care provider. This is important. SEEK MEDICAL CARE IF:  You have a fever.  Your chest pain becomes worse.  You have new symptoms. SEEK IMMEDIATE MEDICAL CARE IF:  You have nausea or vomiting.  You feel sweaty or light-headed.  You have a cough with phlegm (sputum) or you cough up blood.  You develop shortness of breath.   This information is not intended to replace advice given to you by your health care provider. Make sure you discuss any questions you have with your health care provider.   Document Released: 05/27/2005 Document Revised: 02/15/2015 Document Reviewed: 08/22/2014 Elsevier Interactive Patient Education [employer].    To find a primary care or specialty doctor please call [phone] or [phone] to access "[place] a Doctor Service."  You may also go on the [facility] website at [email or web address]  There are also multiple [facility] and [facility] practices throughout the [place] that are frequently accepting new patients. You may find a clinic that is close to your home and contact them.  [facility] -  [address] [place] [phone]  [facility] in [place] (also  locations in [place] and [place]) -  [place] [address] [address]  [place] [address] [phone]

## 2015-10-27 NOTE — Care Management (Signed)
Patient was brought back  in to the ED by [employer] after, being discharged earlier from the ED. Apparently, patient has been banned from [employer]. Patient has been placed in several different ALF's [place] and [place]. Patient is homeless. CM consulted CSW to determine best plan for safe discharge. CM and CSW will continue to follow up with patient.[name]

## 2015-10-27 NOTE — ED Notes (Signed)
Per security and [employer] that has been talking with pt multiple times over the last hour.  They report pt was at bus stop and said his chest no longer hurt and that he was getting on bus.

## 2015-10-27 NOTE — ED Notes (Addendum)
Pt is here for the second time today with c/o chest pain that he states started 2 hours ago associated with SOB. Pt was just d/c from here a few hours ago.

## 2015-10-27 NOTE — ED Notes (Signed)
Patient states he has chest pain started at a 10/10 now at a 4.  Denies any headache.  A&Ox4

## 2015-10-28 ENCOUNTER — Emergency Department (HOSPITAL_COMMUNITY)
Admission: EM | Admit: 2015-10-28 | Discharge: 2015-10-28 | Discharge status: Against Medical Advice | Payer: Medicare Other | Source: Home / Self Care | Attending: Emergency Medicine | Admitting: Emergency Medicine

## 2015-10-28 ENCOUNTER — Emergency Department (HOSPITAL_COMMUNITY)
Admission: EM | Admit: 2015-10-28 | Discharge: 2015-10-28 | Disposition: A | Discharge status: Home / Self Care | Payer: Medicare Other | Source: Home / Self Care | Attending: Emergency Medicine | Admitting: Emergency Medicine

## 2015-10-28 ENCOUNTER — Encounter (HOSPITAL_COMMUNITY): Payer: Self-pay | Admitting: Emergency Medicine

## 2015-10-28 ENCOUNTER — Encounter (HOSPITAL_COMMUNITY): Payer: Self-pay

## 2015-10-28 DIAGNOSIS — R079 Chest pain, unspecified: Principal | ICD-10-CM

## 2015-10-28 DIAGNOSIS — Z86010 Personal history of colonic polyps: Secondary | ICD-10-CM | POA: Insufficient documentation

## 2015-10-28 DIAGNOSIS — Z8572 Personal history of non-Hodgkin lymphomas: Secondary | ICD-10-CM | POA: Insufficient documentation

## 2015-10-28 DIAGNOSIS — Z8659 Personal history of other mental and behavioral disorders: Secondary | ICD-10-CM

## 2015-10-28 DIAGNOSIS — Z79899 Other long term (current) drug therapy: Secondary | ICD-10-CM

## 2015-10-28 DIAGNOSIS — Z7982 Long term (current) use of aspirin: Secondary | ICD-10-CM

## 2015-10-28 DIAGNOSIS — Z7952 Long term (current) use of systemic steroids: Secondary | ICD-10-CM | POA: Insufficient documentation

## 2015-10-28 DIAGNOSIS — Z8781 Personal history of (healed) traumatic fracture: Secondary | ICD-10-CM

## 2015-10-28 DIAGNOSIS — Z8701 Personal history of pneumonia (recurrent): Secondary | ICD-10-CM

## 2015-10-28 DIAGNOSIS — Z86018 Personal history of other benign neoplasm: Secondary | ICD-10-CM | POA: Insufficient documentation

## 2015-10-28 DIAGNOSIS — Z87438 Personal history of other diseases of male genital organs: Secondary | ICD-10-CM | POA: Insufficient documentation

## 2015-10-28 DIAGNOSIS — E785 Hyperlipidemia, unspecified: Secondary | ICD-10-CM | POA: Insufficient documentation

## 2015-10-28 DIAGNOSIS — Z8719 Personal history of other diseases of the digestive system: Secondary | ICD-10-CM

## 2015-10-28 DIAGNOSIS — Z79891 Long term (current) use of opiate analgesic: Secondary | ICD-10-CM | POA: Insufficient documentation

## 2015-10-28 DIAGNOSIS — Z950 Presence of cardiac pacemaker: Secondary | ICD-10-CM

## 2015-10-28 DIAGNOSIS — M109 Gout, unspecified: Secondary | ICD-10-CM | POA: Insufficient documentation

## 2015-10-28 DIAGNOSIS — I10 Essential (primary) hypertension: Secondary | ICD-10-CM

## 2015-10-28 DIAGNOSIS — I482 Chronic atrial fibrillation: Secondary | ICD-10-CM | POA: Insufficient documentation

## 2015-10-28 DIAGNOSIS — F17210 Nicotine dependence, cigarettes, uncomplicated: Secondary | ICD-10-CM

## 2015-10-28 DIAGNOSIS — I208 Other forms of angina pectoris: Secondary | ICD-10-CM | POA: Insufficient documentation

## 2015-10-28 DIAGNOSIS — Z8639 Personal history of other endocrine, nutritional and metabolic disease: Secondary | ICD-10-CM

## 2015-10-28 DIAGNOSIS — G8929 Other chronic pain: Secondary | ICD-10-CM

## 2015-10-28 DIAGNOSIS — I25119 Atherosclerotic heart disease of native coronary artery with unspecified angina pectoris: Secondary | ICD-10-CM | POA: Insufficient documentation

## 2015-10-28 DIAGNOSIS — J441 Chronic obstructive pulmonary disease with (acute) exacerbation: Secondary | ICD-10-CM | POA: Diagnosis not present

## 2015-10-28 DIAGNOSIS — Z590 Homelessness: Secondary | ICD-10-CM | POA: Insufficient documentation

## 2015-10-28 DIAGNOSIS — R0789 Other chest pain: Secondary | ICD-10-CM | POA: Insufficient documentation

## 2015-10-28 NOTE — ED Notes (Signed)
When this RN walked by room to check patient in, patient was not in room. Unable to complete full triage. Pt was not present to verify questions. Pt was brought in by EMS.

## 2015-10-28 NOTE — ED Notes (Signed)
Pt verbalized understanding of d/c instructions and follow-up care. No further questions/concerns, VSS, ambulatory w/ steady gait (refused wheelchair) 

## 2015-10-28 NOTE — ED Notes (Signed)
Per [facility] complains of chest pain that started 2 hours ago.  Received 324 mg aspirin en route.  Patient alert and oriented at this time.  Patient states pain has improved from when it started.

## 2015-10-28 NOTE — ED Notes (Signed)
Gave pt water, per Dr. Eulis Foster. Informed Hannah - RN.

## 2015-10-28 NOTE — ED Notes (Signed)
Left sided chest pain that moves into left arm.  States it'll go away in a little while.

## 2015-10-28 NOTE — ED Notes (Signed)
Pt escorted to waiting room by this RN. NAD.

## 2015-10-28 NOTE — ED Notes (Signed)
Patient received 4 baby aspirin and 1 sl nito pta

## 2015-10-28 NOTE — ED Notes (Signed)
This RN walked into room. Pt sitting in bed, pt had a bowel movement and thew it all over the floor with soiled toilet paper. Dr Leonides Schanz notified and pt to be escorted out after receiving tylenol, prednisone and inhaler. Pt found with supplies that he took from supply drawers in room

## 2015-10-28 NOTE — ED Provider Notes (Signed)
TIME SEEN: 4:15 AM  CHIEF COMPLAINT: Chronic chest pain  HPI: Pt is a 70 y.o. male with history of complete heart block status post pacemaker, atrial fibrillation, COPD who is well-known to this emergency department for chronic chest pain. This is patient's third visit in the past 24 hours. He complains of left-sided sharp chest pain that is worse with movement and palpation. Did have dry cough and wheezing earlier but was provided albuterol inhaler, prednisone and now his wheezing has resolved. When I asked [name] what it is he would like for [place] to do for him today he states "I don't know".  He states that this is his chronic pain and unchanged from his baseline. He has had this pain for over 2 years. He is asking for a bus pass. He has asked several times for food.  ROS: See HPI Constitutional: no fever  Eyes: no drainage  ENT: no runny nose   Cardiovascular:   chest pain  Resp: no SOB  GI: no vomiting GU: no dysuria Integumentary: no rash  Allergy: no hives  Musculoskeletal: no leg swelling  Neurological: no slurred speech ROS otherwise negative  PAST MEDICAL HISTORY/PAST SURGICAL HISTORY:  Past Medical History  Diagnosis Date  . Depression   . Fall at nursing home     December 2014  . History of migraine   . Gout attack   . Complete heart block ([place])     St. Jude pacemaker - followed by Dr. Lovena Le  . Chronic atrial fibrillation ([place])   . HLD (hyperlipidemia)   . GIB (gastrointestinal bleeding)   . BPH (benign prostatic hyperplasia)   . Essential hypertension   . Gastric ulcer   . Non Hodgkin's lymphoma ([place])   . Tubular adenoma of colon   . Aspiration pneumonia ([place]) 10/07/2014  . 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 Atypical angina ([place]) 12/01/2014  . BLOCK, AV, COMPLETE 12/24/2006    Annotation: Pacer placed Qualifier: Diagnosis of  By: Hilma Favors  DO, Beth    . CARDIOMYOPATHY 12/24/2006    Annotation: Alcoholic,  ischemic- Last EF 45% 5/08 Qualifier: Diagnosis of  By: Hilma Favors  DO, Beth    . Fracture femur, cervicotrochanteric ([place]) 05/27/2013    MEDICATIONS:  Prior to Admission medications   Medication Sig Start Date End Date Taking? Authorizing Provider  albuterol (PROVENTIL HFA;VENTOLIN HFA) 108 (90 Base) MCG/ACT inhaler Inhale 2 puffs into the lungs every 4 (four) hours as needed for wheezing or shortness of breath. 08/07/15   Venetia Maxon Rama, MD  aspirin EC 81 MG tablet Take 81 mg by mouth daily.    Historical Provider, MD  carvedilol (COREG) 6.25 MG tablet Take 1 tablet (6.25 mg total) by mouth 2 (two) times daily with a meal. 09/23/15   Blanchie Dessert, MD  diazepam (VALIUM) 5 MG tablet Take 5 mg by mouth every 12 (twelve) hours as needed for anxiety.    Historical Provider, MD  diphenoxylate-atropine (LOMOTIL) 2.5-0.025 MG tablet Take 1 tablet by mouth 4 (four) times daily as needed for diarrhea or loose stools. 09/25/15   Tanna Furry, MD  feeding supplement, ENSURE ENLIVE, (ENSURE ENLIVE) LIQD Take 237 mLs by mouth 2 (two) times daily between meals. 08/07/15   Venetia Maxon Rama, MD  omeprazole (PRILOSEC) 40 MG capsule Take 40 mg by mouth daily.    Historical Provider, MD  oxyCODONE-acetaminophen (PERCOCET/ROXICET) 5-325 MG tablet Take 1 tablet by mouth  every 6 (six) hours as needed for severe pain.    Historical Provider, MD  predniSONE (DELTASONE) 20 MG tablet Take 3 tablets (60 mg total) by mouth daily. 10/27/15   Mariaeduarda Defranco N Tyrail [place], DO    ALLERGIES:  No Known Allergies  SOCIAL HISTORY:  Social History  Substance Use Topics  . Smoking status: Current Every Day Smoker -- 0.50 packs/day for 66 years    Types: Cigarettes  . Smokeless tobacco: Former Systems developer    Types: Snuff, Chew     Comment: 05/27/2013 "haven't used chew or snuff in ~ 30 yr"  . Alcohol Use: 0.0 oz/week    0 Standard drinks or equivalent per week     Comment: licqor monthly    FAMILY HISTORY: Family History  Problem Relation Age  of Onset  . Colon cancer Mother     EXAM: BP 151/84 mmHg  Pulse 98  Temp(Src) 97.4 F (36.3 C) (Oral)  Resp 22  SpO2 97% CONSTITUTIONAL: Alert and oriented and responds appropriately to questions. Chronically ill-appearing, in no distress HEAD: Normocephalic EYES: Conjunctivae clear, PERRL ENT: normal nose; no rhinorrhea; moist mucous membranes NECK: Supple, no meningismus, no LAD  CARD: Irregularly irregular; S1 and S2 appreciated; no murmurs, no clicks, no rubs, no gallops CHEST:  Left chest wall is mild to palpation without crepitus, ecchymosis or deformity, rash, flail chest RESP: Normal chest excursion without splinting or tachypnea; breath sounds clear and equal bilaterally; no wheezes, no rhonchi, no rales, no hypoxia or respiratory distress, speaking full sentences ABD/GI: Normal bowel sounds; non-distended; soft, non-tender, no rebound, no guarding, no peritoneal signs BACK:  The back appears normal and is non-tender to palpation, there is no CVA tenderness EXT: Normal ROM in all joints; non-tender to palpation; no edema; normal capillary refill; no cyanosis, no calf tenderness or swelling    SKIN: Normal color for age and race; warm; no rash NEURO: Moves all extremities equally, sensation to light touch intact diffusely, cranial nerves II through XII intact PSYCH: Poor grooming. Poor insight but no SI, HI, hallucinations.  MEDICAL DECISION MAKING: Patient here with chronic chest pain. Had negative chest x-ray less than 48 hours ago. Has had unchanged EKGs. Multiple negative troponins in the past. Seems to be musculoskeletal in nature. Previously when I saw patient he was wheezing but this is now resolved. He was provided with albuterol inhaler and steroid burst. States that his wheezing, cough has improved. He is requesting food and a bus pass. I do not feel that these should be provided to patient as he is frequently here in the emergency department for secondary gain. At this  time, I do not feel there is any life-threatening emergency. I feel he is safe to be discharged. I have provided him outpatient resources.  At this time, I do not feel there is any life-threatening condition present. I have reviewed and discussed all results (EKG, imaging, lab, urine as appropriate), exam findings with patient. I have reviewed nursing notes and appropriate previous records.  I feel the patient is safe to be discharged home without further emergent workup. Discussed usual and customary return precautions. Patient and family (if present) verbalize understanding and are comfortable with this plan.  Patient will follow-up with their primary care provider. If they do not have a primary care provider, information for follow-up has been provided to them. All questions have been answered.    EKG Interpretation  Date/Time:  Saturday Oct 28 2015 03:54:46 EDT Ventricular Rate:  95 PR Interval:  QRS Duration: 102 QT Interval:  342 QTC Calculation: 430 R Axis:   -18 Text Interpretation:  Atrial fibrillation Borderline left axis deviation Anterior infarct, old Abnormal T, consider ischemia, lateral leads No significant change since last tracing Confirmed by Shemiah Rosch,  DO, [place] [phone]) on 10/28/2015 3:59:43 AM        [place], DO 10/28/15 [id number]

## 2015-10-28 NOTE — ED Notes (Signed)
See MD note. MD was able to see and assess before this RN could see patient.

## 2015-10-28 NOTE — ED Notes (Signed)
Pt. Here for pain in his chest for what pt. Reports as his defib. Going off 5x. EDP made aware. 324 ASA given en route.

## 2015-10-28 NOTE — Discharge Instructions (Signed)
Take one aspirin each day since it seems to be helping your pain.   Nonspecific Chest Pain It is often hard to find the cause of chest pain. There is always a chance that your pain could be related to something serious, such as a heart attack or a blood clot in your lungs. Chest pain can also be caused by conditions that are not life-threatening. If you have chest pain, it is very important to follow up with your doctor.  HOME CARE  If you were prescribed an antibiotic medicine, finish it all even if you start to feel better.  Avoid any activities that cause chest pain.  Do not use any tobacco products, including cigarettes, chewing tobacco, or electronic cigarettes. If you need help quitting, ask your doctor.  Do not drink alcohol.  Take medicines only as told by your doctor.  Keep all follow-up visits as told by your doctor. This is important. This includes any further testing if your chest pain does not go away.  Your doctor may tell you to keep your head raised (elevated) while you sleep.  Make lifestyle changes as told by your doctor. These may include:  Getting regular exercise. Ask your doctor to suggest some activities that are safe for you.  Eating a heart-healthy diet. Your doctor or a diet specialist (dietitian) can help you to learn healthy eating options.  Maintaining a healthy weight.  Managing diabetes, if necessary.  Reducing stress. GET HELP IF:  Your chest pain does not go away, even after treatment.  You have a rash with blisters on your chest.  You have a fever. GET HELP RIGHT AWAY IF:  Your chest pain is worse.  You have an increasing cough, or you cough up blood.  You have severe belly (abdominal) pain.  You feel extremely weak.  You pass out (faint).  You have chills.  You have sudden, unexplained chest discomfort.  You have sudden, unexplained discomfort in your arms, back, neck, or jaw.  You have shortness of breath at any  time.  You suddenly start to sweat, or your skin gets clammy.  You feel nauseous.  You vomit.  You suddenly feel light-headed or dizzy.  Your heart begins to beat quickly, or it feels like it is skipping beats. These symptoms may be an emergency. Do not wait to see if the symptoms will go away. Get medical help right away. Call your local emergency services (911 in the [place] Do not drive yourself to the hospital.   This information is not intended to replace advice given to you by your health care provider. Make sure you discuss any questions you have with your health care provider.   Document Released: 11/13/2007 Document Revised: 06/17/2014 Document Reviewed: 12/31/2013 Elsevier Interactive Patient Education [employer].

## 2015-10-28 NOTE — ED Provider Notes (Addendum)
CSN: [id number]     Arrival date & time 10/28/15  0935 History   First MD Initiated Contact with Patient 10/28/15 [phone]     Chief Complaint  Patient presents with  . Chest Pain     (Consider location/radiation/quality/duration/timing/severity/associated sxs/prior Treatment) HPI   [name] is a 70 y.o. male who is here for evaluation of chest pain. He came by EMS. He was discharged from this emergency department, about 6 hours ago. He is unable to recall when the chest pain started. He was treated with aspirin by EMS, with near complete resolution of his chest pain. At the time of evaluation. He states that his chest pain is 2/10. Patient has frequent emergency department visits. He has known chronic atrial fibrillation and has a pacemaker. There is no documentation in the chart of the status of his coronary arteries. However, he is here nearly daily, and gets frequent EKGs, which have never shown any myocardial damage. At the time of evaluation. He is happy and states that he has no further complaints. He stated that he would like to have a steak dinner today. He likes it with mashed potatoes. He has no other specific complaints, and there are no other known modifying factors.   Past Medical History  Diagnosis Date  . Depression   . Fall at nursing home     December 2014  . History of migraine   . Gout attack   . Complete heart block ([place])     St. Jude pacemaker - followed by Dr. Lovena Le  . Chronic atrial fibrillation ([place])   . HLD (hyperlipidemia)   . GIB (gastrointestinal bleeding)   . BPH (benign prostatic hyperplasia)   . Essential hypertension   . Gastric ulcer   . Non Hodgkin's lymphoma ([place])   . Tubular adenoma of colon   . Aspiration pneumonia ([place]) 10/07/2014  . 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 Atypical angina ([place]) 12/01/2014  . BLOCK, AV, COMPLETE 12/24/2006    Annotation: Pacer placed Qualifier: Diagnosis of  By:  Hilma Favors  DO, Beth    . 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 Fracture femur, cervicotrochanteric ([place]) 05/27/2013   Past Surgical History  Procedure Laterality Date  . Pacemaker generator change      Dr. Lovena Le  . Appendectomy    . Cholecystectomy    . Open reduction of hip Right 05/28/2013    Procedure: OPEN REDUCTION OF HIP;  Surgeon: Renette Butters, MD;  Location: [facility];  Service: Orthopedics;  Laterality: Right;  . Esophagogastroduodenoscopy (egd) with propofol N/A 09/26/2014    Procedure: ESOPHAGOGASTRODUODENOSCOPY (EGD) WITH PROPOFOL;  Surgeon: Jerene Bears, MD;  Location: [facility] ENDOSCOPY;  Service: Endoscopy;  Laterality: N/A;  . Colonoscopy N/A 10/01/2014    Procedure: COLONOSCOPY;  Surgeon: Jerene Bears, MD;  Location: [facility] ENDOSCOPY;  Service: Endoscopy;  Laterality: N/A;  . Partial colectomy N/A 10/05/2014    Procedure: SUBTOTAL COLECTOMY WITH ILEORECTAL ANASTOMOSIS;  Surgeon: Donnie Mesa, MD;  Location: [place];  Service: General;  Laterality: N/A;  . Flexible sigmoidoscopy N/A 11/07/2014    Procedure: FLEXIBLE SIGMOIDOSCOPY;  Surgeon: Jerene Bears, MD;  Location: [facility] ENDOSCOPY;  Service: Endoscopy;  Laterality: N/A;  . Gastric resection    . Flexible sigmoidoscopy N/A 07/30/2015    Procedure: FLEXIBLE SIGMOIDOSCOPY;  Surgeon: Doran Stabler, MD;  Location: [facility] ENDOSCOPY;  Service: Endoscopy;  Laterality: N/A;  . Transanal excision of rectal mass N/A 08/03/2015    Procedure: TRANSANAL MINIMALLY INVASIVE SURGERY TO RESECT RECTAL POLYP;  Surgeon: [name], MD;  Location: [facility];  Service: General;  Laterality: N/A;  . Flexible sigmoidoscopy N/A 08/03/2015    Procedure: FLEXIBLE SIGMOIDOSCOPY;  Surgeon: [name], MD;  Location: [facility] ORS;  Service: General;  Laterality: N/A;   Family History  Problem Relation Age of Onset  . Colon cancer Mother    Social History  Substance Use Topics   . Smoking status: Current Every Day Smoker -- 0.50 packs/day for 66 years    Types: Cigarettes  . Smokeless tobacco: Former Systems developer    Types: Snuff, Chew     Comment: 05/27/2013 "haven't used chew or snuff in ~ 30 yr"  . Alcohol Use: 0.0 oz/week    0 Standard drinks or equivalent per week     Comment: licqor monthly    Review of Systems  All other systems reviewed and are negative.     Allergies  Review of patient's allergies indicates no known allergies.  Home Medications   Prior to Admission medications   Medication Sig Start Date End Date Taking? Authorizing Provider  albuterol (PROVENTIL HFA;VENTOLIN HFA) 108 (90 Base) MCG/ACT inhaler Inhale 2 puffs into the lungs every 4 (four) hours as needed for wheezing or shortness of breath. 08/07/15   Venetia Maxon Rama, MD  aspirin EC 81 MG tablet Take 81 mg by mouth daily.    Historical Provider, MD  carvedilol (COREG) 6.25 MG tablet Take 1 tablet (6.25 mg total) by mouth 2 (two) times daily with a meal. 09/23/15   Blanchie Dessert, MD  diazepam (VALIUM) 5 MG tablet Take 5 mg by mouth every 12 (twelve) hours as needed for anxiety.    Historical Provider, MD  diphenoxylate-atropine (LOMOTIL) 2.5-0.025 MG tablet Take 1 tablet by mouth 4 (four) times daily as needed for diarrhea or loose stools. 09/25/15   Tanna Furry, MD  feeding supplement, ENSURE ENLIVE, (ENSURE ENLIVE) LIQD Take 237 mLs by mouth 2 (two) times daily between meals. 08/07/15   Venetia Maxon Rama, MD  omeprazole (PRILOSEC) 40 MG capsule Take 40 mg by mouth daily.    Historical Provider, MD  oxyCODONE-acetaminophen (PERCOCET/ROXICET) 5-325 MG tablet Take 1 tablet by mouth every 6 (six) hours as needed for severe pain.    Historical Provider, MD  predniSONE (DELTASONE) 20 MG tablet Take 3 tablets (60 mg total) by mouth daily. 10/27/15   Kristen N Ward, DO   BP 131/82 mmHg  Pulse 90  Temp(Src) 97.8 F (36.6 C) (Oral)  Resp 20  SpO2 99% Physical Exam  Constitutional: He appears  well-developed. No distress.  Elderly thin man  HENT:  Head: Normocephalic and atraumatic.  Right Ear: External ear normal.  Left Ear: External ear normal.  Eyes: Conjunctivae and EOM are normal. Pupils are equal, round, and reactive to light.  Neck: Normal range of motion and phonation normal. Neck supple.  Cardiovascular: Normal rate.   Pulmonary/Chest: Effort normal. He exhibits no bony tenderness.  Musculoskeletal: Normal range of motion.  Neurological: He is alert. No cranial nerve deficit or sensory deficit. He exhibits normal muscle tone. Coordination normal.  Skin: Skin is warm, dry and intact.  Psychiatric: He has a normal mood and affect. His behavior is normal. Judgment and thought content normal.  Nursing note and vitals reviewed.   ED  Course  Procedures (including critical care time)  Medications - No data to display  Patient Vitals for the past 24 hrs:  BP Temp Temp src Pulse Resp SpO2  10/28/15 0943 131/82 mmHg 97.8 F (36.6 C) Oral 90 20 99 %   Patient had a bowel movement in the emergency department, which is something he frequently does.      Labs Review Labs Reviewed - No data to display  Imaging Review Dg Chest 2 View  10/26/2015  CLINICAL DATA:  70 year old male with left-sided chest pain and shortness of breath EXAM: CHEST  2 VIEW COMPARISON:  Chest radiograph dated 05/14 10/22/2015 FINDINGS: Two views of the chest demonstrate mild emphysematous changes of the lungs. There is no focal consolidation. No pleural effusion or pneumothorax. There is mild moderate cardiomegaly. Right pectoral pacemaker device noted. No acute osseous pathology. IMPRESSION: No active cardiopulmonary disease. Cardiomegaly. Electronically Signed   By: Anner Crete M.D.   On: 10/26/2015 20:31   I have personally reviewed and evaluated these images and lab results as part of my medical decision-making.   EKG Interpretation   Date/Time:  Saturday Oct 28 2015 09:40:35  EDT Ventricular Rate:  85 PR Interval:    QRS Duration: 97 QT Interval:  362 QTC Calculation: 430 R Axis:   -152 Text Interpretation:  Atrial fibrillation Right axis deviation Nonspecific  T abnormalities, lateral leads Minimal ST elevation, anterior leads since  last tracing no significant change Confirmed by Eulis Foster  MD, Maeghan Canny [id number])  on 10/28/2015 10:02:41 AM      MDM   Final diagnoses:  Chest pain, unspecified    Chronic abuser of the emergency room and, emergency services. Patient has no apparent acute disability at this time.   Nursing Notes Reviewed/ Care Coordinated Applicable Imaging Reviewed Interpretation of Laboratory Data incorporated into ED treatment  The patient appears reasonably screened and/or stabilized for discharge and I doubt any other medical condition or other [facility] requiring further screening, evaluation, or treatment in the ED at this time prior to discharge.  Plan: Home Medications- usual; Home Treatments- none; return here if the recommended treatment, does not improve the symptoms; Recommended follow up- PCP prn   Daleen Bo, MD 10/28/15 [place], MD 10/28/15 1013

## 2015-10-28 NOTE — ED Provider Notes (Signed)
CSN: [id number]     Arrival date & time 10/28/15  1727 History   First MD Initiated Contact with Patient 10/28/15 1727     Chief Complaint  Patient presents with  . Chest Pain     (Consider location/radiation/quality/duration/timing/severity/associated sxs/prior Treatment) The history is provided by the patient.  [name] is a 70 y.o. male hx of heart block, afib, homeless here with chest pain. This is his third ED visit today for the same complaint. Patient [place] the [address] and called EMS for chest pain. Was seen in the ED hours ago and was evaluated. He had extensive workup recently and had labs and CXR 2 days ago that were unremarkable. Case management and social work has been involved and patient has been kicked out of all the homeless shelters. States that chest pain just never went away but denies worsening pain    Past Medical History  Diagnosis Date  . Depression   . Fall at nursing home     December 2014  . History of migraine   . Gout attack   . Complete heart block ([place])     St. Jude pacemaker - followed by Dr. Lovena Le  . Chronic atrial fibrillation ([place])   . HLD (hyperlipidemia)   . GIB (gastrointestinal bleeding)   . BPH (benign prostatic hyperplasia)   . Essential hypertension   . Gastric ulcer   . Non Hodgkin's lymphoma ([place])   . Tubular adenoma of colon   . Aspiration pneumonia ([place]) 10/07/2014  . 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 Atypical angina ([place]) 12/01/2014  . BLOCK, AV, COMPLETE 12/24/2006    Annotation: Pacer placed Qualifier: Diagnosis of  By: Hilma Favors  DO, Beth    . 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 Fracture femur, cervicotrochanteric ([place]) 05/27/2013   Past Surgical History  Procedure Laterality Date  . Pacemaker generator change      Dr. Lovena Le  . Appendectomy    . Cholecystectomy    .  Open reduction of hip Right 05/28/2013    Procedure: OPEN REDUCTION OF HIP;  Surgeon: Renette Butters, MD;  Location: [place];  Service: Orthopedics;  Laterality: Right;  . Esophagogastroduodenoscopy (egd) with propofol N/A 09/26/2014    Procedure: ESOPHAGOGASTRODUODENOSCOPY (EGD) WITH PROPOFOL;  Surgeon: Jerene Bears, MD;  Location: [facility] ENDOSCOPY;  Service: Endoscopy;  Laterality: N/A;  . Colonoscopy N/A 10/01/2014    Procedure: COLONOSCOPY;  Surgeon: Jerene Bears, MD;  Location[facility] ENDOSCOPY;  Service: Endoscopy;  Laterality: N/A;  . Partial colectomy N/A 10/05/2014    Procedure: SUBTOTAL COLECTOMY WITH ILEORECTAL ANASTOMOSIS;  Surgeon: Donnie Mesa, MD;  Location: [place];  Service: General;  Laterality: N/A;  . Flexible sigmoidoscopy N/A 11/07/2014    Procedure: FLEXIBLE SIGMOIDOSCOPY;  Surgeon: Jerene Bears, MD;  Location: [facility] ENDOSCOPY;  Service: Endoscopy;  Laterality: N/A;  . Gastric resection    . Flexible sigmoidoscopy N/A 07/30/2015    Procedure: FLEXIBLE SIGMOIDOSCOPY;  Surgeon: Doran Stabler, MD;  Location: [facility] ENDOSCOPY;  Service: Endoscopy;  Laterality: N/A;  . Transanal excision of rectal mass N/A 08/03/2015    Procedure: TRANSANAL MINIMALLY INVASIVE SURGERY TO RESECT RECTAL POLYP;  Surgeon: [name], MD;  Location: [facility];  Service: General;  Laterality: N/A;  . Flexible sigmoidoscopy N/A 08/03/2015  Procedure: FLEXIBLE SIGMOIDOSCOPY;  Surgeon: [name], MD;  Location: [facility];  Service: General;  Laterality: N/A;   Family History  Problem Relation Age of Onset  . Colon cancer Mother    Social History  Substance Use Topics  . Smoking status: Current Every Day Smoker -- 0.50 packs/day for 66 years    Types: Cigarettes  . Smokeless tobacco: Former Systems developer    Types: Snuff, Chew     Comment: 05/27/2013 "haven't used chew or snuff in ~ 30 yr"  . Alcohol Use: 0.0 oz/week    0 Standard drinks or equivalent per week     Comment: licqor monthly    Review of Systems   Cardiovascular: Positive for chest pain.  All other systems reviewed and are negative.     Allergies  Review of patient's allergies indicates no known allergies.  Home Medications   Prior to Admission medications   Medication Sig Start Date End Date Taking? Authorizing Provider  albuterol (PROVENTIL HFA;VENTOLIN HFA) 108 (90 Base) MCG/ACT inhaler Inhale 2 puffs into the lungs every 4 (four) hours as needed for wheezing or shortness of breath. 08/07/15   Venetia Maxon Rama, MD  aspirin EC 81 MG tablet Take 81 mg by mouth daily.    Historical Provider, MD  carvedilol (COREG) 6.25 MG tablet Take 1 tablet (6.25 mg total) by mouth 2 (two) times daily with a meal. 09/23/15   Blanchie Dessert, MD  diazepam (VALIUM) 5 MG tablet Take 5 mg by mouth every 12 (twelve) hours as needed for anxiety.    Historical Provider, MD  diphenoxylate-atropine (LOMOTIL) 2.5-0.025 MG tablet Take 1 tablet by mouth 4 (four) times daily as needed for diarrhea or loose stools. 09/25/15   Tanna Furry, MD  feeding supplement, ENSURE ENLIVE, (ENSURE ENLIVE) LIQD Take 237 mLs by mouth 2 (two) times daily between meals. 08/07/15   Venetia Maxon Rama, MD  omeprazole (PRILOSEC) 40 MG capsule Take 40 mg by mouth daily.    Historical Provider, MD  oxyCODONE-acetaminophen (PERCOCET/ROXICET) 5-325 MG tablet Take 1 tablet by mouth every 6 (six) hours as needed for severe pain.    Historical Provider, MD  predniSONE (DELTASONE) 20 MG tablet Take 3 tablets (60 mg total) by mouth daily. 10/27/15   Kristen N Ward, DO   BP 120/63 mmHg  Pulse 106  Temp(Src) 99.5 F (37.5 C) (Oral)  Resp 18  Ht 5\' 7"  (1.702 m)  Wt 99 lb (44.906 kg)  BMI 15.50 kg/m2  SpO2 96% Physical Exam  Constitutional: He is oriented to person, place, and time. He appears well-developed.  Disheveled   HENT:  Head: Normocephalic.  Mouth/Throat: Oropharynx is clear and moist.  Eyes: Conjunctivae are normal. Pupils are equal, round, and reactive to light.  Neck:  Normal range of motion. Neck supple.  Cardiovascular: Normal rate, regular rhythm and normal heart sounds.   Pulmonary/Chest: Effort normal and breath sounds normal. No respiratory distress. He has no wheezes. He has no rales.  Abdominal: Soft. Bowel sounds are normal. He exhibits no distension. There is no tenderness. There is no rebound.  Musculoskeletal: Normal range of motion. He exhibits no edema or tenderness.  Neurological: He is alert and oriented to person, place, and time.  Skin: Skin is warm and dry.  Psychiatric: He has a normal mood and affect. His behavior is normal. Judgment and thought content normal.  Nursing note and vitals reviewed.   ED Course  Procedures (including critical care time) Labs Review Labs Reviewed - No  data to display  Imaging Review Dg Chest 2 View  10/26/2015  CLINICAL DATA:  70 year old male with left-sided chest pain and shortness of breath EXAM: CHEST  2 VIEW COMPARISON:  Chest radiograph dated 05/14 10/22/2015 FINDINGS: Two views of the chest demonstrate mild emphysematous changes of the lungs. There is no focal consolidation. No pleural effusion or pneumothorax. There is mild moderate cardiomegaly. Right pectoral pacemaker device noted. No acute osseous pathology. IMPRESSION: No active cardiopulmonary disease. Cardiomegaly. Electronically Signed   By: Anner Crete M.D.   On: 10/26/2015 20:31   I have personally reviewed and evaluated these images and lab results as part of my medical decision-making.   EKG Interpretation   Date/Time:  Saturday Oct 28 2015 17:33:08 EDT Ventricular Rate:  109 PR Interval:    QRS Duration: 98 QT Interval:  370 QTC Calculation: 498 R Axis:   -120 Text Interpretation:  Atrial fibrillation Right superior axis Borderline T  abnormalities, inferior leads No significant change since last tracing  Confirmed by Dulse Rutan  MD, Dustan Hyams ([id number]) on 10/28/2015 5:40:18 PM      MDM   Final diagnoses:  Other chest pain    [name] is a 70 y.o. male here with chest pain. Chest pain chronic and unchanged since before. Patient homeless. Social work and case management has been involved in the last several days and working on a care plan. Vitals stable. EKG unchanged. No emergent medical condition that warrants further workup. Will discharge patient.     Wandra Arthurs, MD 10/28/15 [phone]

## 2015-10-28 NOTE — ED Provider Notes (Signed)
CSN: [id number]     Arrival date & time 10/28/15  1201 History   First MD Initiated Contact with Patient 10/28/15 1207     Chief Complaint  Patient presents with  . Chest Pain     (Consider location/radiation/quality/duration/timing/severity/associated sxs/prior Treatment) HPI   [name] is a 70 y.o. male who presents for evaluation of chest pain. He was discharged from the emergency department just a couple of hours ago with a similar complaint. This is his fourth evaluation in the ED, in the last 24 hours. He came again, by EMS. He told them that he "got shocked". They assumed that he had a defibrillator, and transferred him here. The patient states he does not have a defibrillator, but the chest pain was "like a shock". He has no other concerns or complaints at this time.   Past Medical History  Diagnosis Date  . Depression   . Fall at nursing home     December 2014  . History of migraine   . Gout attack   . 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 Jude pacemaker - followed by Dr. Lovena Le  . Chronic atrial fibrillation ([place])   . HLD (hyperlipidemia)   . GIB (gastrointestinal bleeding)   . BPH (benign prostatic hyperplasia)   . Essential hypertension   . Gastric ulcer   . Non Hodgkin's lymphoma ([place])   . Tubular adenoma of colon   . Aspiration pneumonia ([place]) 10/07/2014  . 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 Atypical angina ([place]) 12/01/2014  . BLOCK, AV, COMPLETE 12/24/2006    Annotation: Pacer placed Qualifier: Diagnosis of  By: Hilma Favors  DO, Beth    . 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 Fracture femur, cervicotrochanteric ([place]) 05/27/2013   Past Surgical History  Procedure Laterality Date  . Pacemaker generator change      Dr. Lovena Le  . Appendectomy    . Cholecystectomy    . Open reduction of hip Right 05/28/2013     Procedure: OPEN REDUCTION OF HIP;  Surgeon: Renette Butters, MD;  Location: [facility];  Service: Orthopedics;  Laterality: Right;  . Esophagogastroduodenoscopy (egd) with propofol N/A 09/26/2014    Procedure: ESOPHAGOGASTRODUODENOSCOPY (EGD) WITH PROPOFOL;  Surgeon: Jerene Bears, MD;  Location: [facility] ENDOSCOPY;  Service: Endoscopy;  Laterality: N/A;  . Colonoscopy N/A 10/01/2014    Procedure: COLONOSCOPY;  Surgeon: Jerene Bears, MD;  Location[facility] ENDOSCOPY;  Service: Endoscopy;  Laterality: N/A;  . Partial colectomy N/A 10/05/2014    Procedure: SUBTOTAL COLECTOMY WITH ILEORECTAL ANASTOMOSIS;  Surgeon: Donnie Mesa, MD;  Location: [facility];  Service: General;  Laterality: N/A;  . Flexible sigmoidoscopy N/A 11/07/2014    Procedure: FLEXIBLE SIGMOIDOSCOPY;  Surgeon: Jerene Bears, MD;  Location[facility] ENDOSCOPY;  Service: Endoscopy;  Laterality: N/A;  . Gastric resection    . Flexible sigmoidoscopy N/A 07/30/2015    Procedure: FLEXIBLE SIGMOIDOSCOPY;  Surgeon: Doran Stabler, MD;  Location: [facility] ENDOSCOPY;  Service: Endoscopy;  Laterality: N/A;  . Transanal excision of rectal mass N/A 08/03/2015    Procedure: TRANSANAL MINIMALLY INVASIVE SURGERY TO RESECT RECTAL POLYP;  Surgeon: [name], MD;  Location: [facility] ORS;  Service: General;  Laterality: N/A;  . Flexible sigmoidoscopy N/A 08/03/2015    Procedure: FLEXIBLE SIGMOIDOSCOPY;  Surgeon: [name], MD;  Location: [facility] ORS;  Service: General;  Laterality: N/A;   Family History  Problem Relation Age of Onset  . Colon cancer Mother    Social History  Substance Use Topics  . Smoking status: Current Every Day Smoker -- 0.50 packs/day for 66 years    Types: Cigarettes  . Smokeless tobacco: Former Systems developer    Types: Snuff, Chew     Comment: 05/27/2013 "haven't used chew or snuff in ~ 30 yr"  . Alcohol Use: 0.0 oz/week    0 Standard drinks or equivalent per week     Comment: licqor monthly    Review of Systems  All other systems reviewed and are  negative.     Allergies  Review of patient's allergies indicates no known allergies.  Home Medications   Prior to Admission medications   Medication Sig Start Date End Date Taking? Authorizing Provider  albuterol (PROVENTIL HFA;VENTOLIN HFA) 108 (90 Base) MCG/ACT inhaler Inhale 2 puffs into the lungs every 4 (four) hours as needed for wheezing or shortness of breath. 08/07/15   Venetia Maxon Rama, MD  aspirin EC 81 MG tablet Take 81 mg by mouth daily.    Historical Provider, MD  carvedilol (COREG) 6.25 MG tablet Take 1 tablet (6.25 mg total) by mouth 2 (two) times daily with a meal. 09/23/15   Blanchie Dessert, MD  diazepam (VALIUM) 5 MG tablet Take 5 mg by mouth every 12 (twelve) hours as needed for anxiety.    Historical Provider, MD  diphenoxylate-atropine (LOMOTIL) 2.5-0.025 MG tablet Take 1 tablet by mouth 4 (four) times daily as needed for diarrhea or loose stools. 09/25/15   Tanna Furry, MD  feeding supplement, ENSURE ENLIVE, (ENSURE ENLIVE) LIQD Take 237 mLs by mouth 2 (two) times daily between meals. 08/07/15   Venetia Maxon Rama, MD  omeprazole (PRILOSEC) 40 MG capsule Take 40 mg by mouth daily.    Historical Provider, MD  oxyCODONE-acetaminophen (PERCOCET/ROXICET) 5-325 MG tablet Take 1 tablet by mouth every 6 (six) hours as needed for severe pain.    Historical Provider, MD  predniSONE (DELTASONE) 20 MG tablet Take 3 tablets (60 mg total) by mouth daily. 10/27/15   [place], DO   There were no vitals taken for this visit. Physical Exam  Constitutional: He is oriented to person, place, and time. He appears well-developed and well-nourished.  HENT:  Head: Normocephalic and atraumatic.  Right Ear: External ear normal.  Left Ear: External ear normal.  Eyes: Conjunctivae and EOM are normal. Pupils are equal, round, and reactive to light.  Neck: Normal range of motion and phonation normal. Neck supple.  Cardiovascular: Normal rate.   Pulmonary/Chest: Effort normal. No respiratory  distress. He exhibits no bony tenderness.  Abdominal: Soft.  Musculoskeletal: Normal range of motion. He exhibits no edema or tenderness.  Neurological: He is alert and oriented to person, place, and time. No cranial nerve deficit or sensory deficit. He exhibits normal muscle tone. Coordination normal.  Skin: Skin is warm, dry and intact.  Psychiatric: He has a normal mood and affect.  Nursing note and vitals reviewed.   ED Course  Procedures (including critical care time)  Initial clinical impression- ongoing chronic chest pain with abuse of emergency services. The patient has a St. Jude's pacemaker, he does not have a defibrillator. There is no clinical concern for pacemaker dysfunction.  Medications - No data to display  No data found.   12:50- patient eloped without telling anyone   Labs Review Labs Reviewed -  No data to display  Imaging Review Dg Chest 2 View  10/26/2015  CLINICAL DATA:  70 year old male with left-sided chest pain and shortness of breath EXAM: CHEST  2 VIEW COMPARISON:  Chest radiograph dated 05/14 10/22/2015 FINDINGS: Two views of the chest demonstrate mild emphysematous changes of the lungs. There is no focal consolidation. No pleural effusion or pneumothorax. There is mild moderate cardiomegaly. Right pectoral pacemaker device noted. No acute osseous pathology. IMPRESSION: No active cardiopulmonary disease. Cardiomegaly. Electronically Signed   By: Anner Crete M.D.   On: 10/26/2015 20:31   I have personally reviewed and evaluated these images and lab results as part of my medical decision-making.   EKG Interpretation None      MDM   Final diagnoses:  Chronic chest pain    Chronic complaints of chest pain with no clinical concern for any acute cardiac abnormality.  Nursing Notes Reviewed/ Care Coordinated, and agree without changes. Applicable Imaging Reviewed.  Interpretation of Laboratory Data incorporated into ED treatment  He left  AMA    Daleen Bo, MD 10/28/15 1259

## 2015-10-28 NOTE — Discharge Instructions (Signed)
See your doctor.   Return to ER if your chest pain is worse than usual, trouble breathing.

## 2015-10-28 NOTE — Discharge Instructions (Signed)
Chronic Pain  Chronic pain can be defined as pain that is off and on and lasts for 3-6 months or longer. Many things cause chronic pain, which can make it difficult to make a diagnosis. There are many treatment options available for chronic pain. However, finding a treatment that works well for you may require trying various approaches until the right one is found. Many people benefit from a combination of two or more types of treatment to control their pain.  SYMPTOMS   Chronic pain can occur anywhere in the body and can range from mild to very severe. Some types of chronic pain include:  · Headache.  · Low back pain.  · Cancer pain.  · Arthritis pain.  · Neurogenic pain. This is pain resulting from damage to nerves.   People with chronic pain may also have other symptoms such as:  · Depression.  · Anger.  · Insomnia.  · Anxiety.  DIAGNOSIS   Your health care provider will help diagnose your condition over time. In many cases, the initial focus will be on excluding possible conditions that could be causing the pain. Depending on your symptoms, your health care provider may order tests to diagnose your condition. Some of these tests may include:   · Blood tests.    · CT scan.    · MRI.    · X-rays.    · Ultrasounds.    · Nerve conduction studies.    You may need to see a specialist.   TREATMENT   Finding treatment that works well may take time. You may be referred to a pain specialist. He or she may prescribe medicine or therapies, such as:   · Mindful meditation or yoga.  · Shots (injections) of numbing or pain-relieving medicines into the spine or area of pain.  · Local electrical stimulation.  · Acupuncture.    · Massage therapy.    · Aroma, color, light, or sound therapy.    · Biofeedback.    · Working with a physical therapist to keep from getting stiff.    · Regular, gentle exercise.    · Cognitive or behavioral therapy.    · Group support.    Sometimes, surgery may be recommended.   HOME CARE INSTRUCTIONS    · Take all medicines as directed by your health care provider.    · Lessen stress in your life by relaxing and doing things such as listening to calming music.    · Exercise or be active as directed by your health care provider.    · Eat a healthy diet and include things such as vegetables, fruits, fish, and lean meats in your diet.    · Keep all follow-up appointments with your health care provider.    · Attend a support group with others suffering from chronic pain.  SEEK MEDICAL CARE IF:   · Your pain gets worse.    · You develop a new pain that was not there before.    · You cannot tolerate medicines given to you by your health care provider.    · You have new symptoms since your last visit with your health care provider.    SEEK IMMEDIATE MEDICAL CARE IF:   · You feel weak.    · You have decreased sensation or numbness.    · You lose control of bowel or bladder function.    · Your pain suddenly gets much worse.    · You develop shaking.  · You develop chills.  · You develop confusion.  · You develop chest pain.  · You develop shortness of breath.    MAKE SURE YOU:  ·   Document Revised: 01/27/2013 Document Reviewed: 11/20/2012 Elsevier Interactive Patient Education [employer].   To find a primary care or specialty doctor please call [phone] or [phone] to access "[place] a Doctor Service."  You may also go on the [facility] website at [email or web address]  There are also multiple [facility], [place] [facility] and [facility] practices throughout the [place] that are frequently accepting new patients. You may find a clinic that is close to your  home and contact them.  [facility] -  [address] [place] [phone]  [facility] in [place] (also locations in [place] and [place]) -  [place] [phone]  [facility] -  [address] [place] [address] [place] [employer] is a great source of information about community services available.  Access by dialing 2-1-1 from anywhere in [place], or by website -  CustodianSupply.fi.   Other Local Resources (Updated 06/2015)  [place]   Phone Number and Address  [place] for homeless and needy men with substance abuse issues [phone] [address] [place]  Emergency assistance  [employer]  Pantry services [phone] [place], [place]  Domestic violence shelter for women and their children [place], [place]  Domestic violence shelter for women and their children [place], [place] [facility])     The [facility] coordinates access to most shelters in [place] in person Monday - Friday, 10 am - 4 pm.    After hours/ weekends, contact individual shelters directly [phone] [address], [place]  [facility] [place]  Emergency financial assistance  Permanent supportive housing [phone] [address] [place], [place]   [employer]   Crisis assistance  Medication  Housing  Food  Utility assistance [phone] [place], [place]    Crisis assistance  Medication  Housing  Food  Utility assistance [place], [place], [place]  [employer] of [place]     Transitional housing  [facility] nurse assistance [phone] [address], [place], [employer] for adult men and women [phone] [address], [place] for those Facing Homelessness    [phone]

## 2015-10-28 NOTE — ED Notes (Signed)
Pt reports that he was just discharged from here this morning at nine for chest pain and is still having chest pain in the left of his chest denies any other symptoms

## 2015-11-07 ENCOUNTER — Emergency Department (HOSPITAL_COMMUNITY)
Admission: EM | Admit: 2015-11-07 | Discharge: 2015-11-07 | Disposition: A | Discharge status: Home / Self Care | Payer: Medicare Other | Attending: Emergency Medicine | Admitting: Emergency Medicine

## 2015-11-07 ENCOUNTER — Encounter (HOSPITAL_COMMUNITY): Payer: Self-pay | Admitting: Emergency Medicine

## 2015-11-07 DIAGNOSIS — Z5321 Procedure and treatment not carried out due to patient leaving prior to being seen by health care provider: Secondary | ICD-10-CM | POA: Insufficient documentation

## 2015-11-07 DIAGNOSIS — Z7982 Long term (current) use of aspirin: Secondary | ICD-10-CM | POA: Insufficient documentation

## 2015-11-07 DIAGNOSIS — E785 Hyperlipidemia, unspecified: Secondary | ICD-10-CM | POA: Insufficient documentation

## 2015-11-07 DIAGNOSIS — F329 Major depressive disorder, single episode, unspecified: Secondary | ICD-10-CM | POA: Insufficient documentation

## 2015-11-07 DIAGNOSIS — M25559 Pain in unspecified hip: Secondary | ICD-10-CM | POA: Insufficient documentation

## 2015-11-07 DIAGNOSIS — I10 Essential (primary) hypertension: Secondary | ICD-10-CM | POA: Insufficient documentation

## 2015-11-07 NOTE — ED Notes (Signed)
Per EMS got out of jail today-having B/L hip pain

## 2015-11-07 NOTE — ED Notes (Signed)
Called pt to treatment area-No response

## 2015-11-07 NOTE — ED Notes (Signed)
Did not respond to called to treatment area

## 2015-11-07 NOTE — ED Notes (Signed)
Called patient to treatment area-no repsonse

## 2015-11-07 NOTE — ED Notes (Signed)
Patient called to triage/fast track for VS and treatment, no answer

## 2015-11-09 ENCOUNTER — Ambulatory Visit (HOSPITAL_COMMUNITY)
Admission: RE | Admit: 2015-11-09 | Discharge: 2015-11-09 | Disposition: A | Discharge status: Home / Self Care | Payer: Medicare Other | Attending: Psychiatry | Admitting: Psychiatry

## 2015-11-09 NOTE — BH Assessment (Addendum)
Assessment Note  [name] is an 70 y.o. white male that is a walk in at [name].  Patient is in a wheel chair and the patient was accompanied by [name], MSE from the [name].  Patient is delusional and reports that he is going to marry a woman named [name].  Patient reports that he has 26 motor cycles and he is a Museum/gallery curator.  Patient reports that he built his own home in [place], [place].  Collateral information obtained from [name] reports that none of what the patient is stating is true.   Patient was orientated to person, time, place and situation.   Patient went on the report that he will be flying out soon to marry [name].  Per [name], the patient was at a [name] and he has been banned from this facility due to smearing feces on the nurses desk.  Per [name] the patient is not able to complete his ADL and is not able to care for himself without assistance.  Patient reports that when he was living in the SNF ([name]) the nurses there assisted him with his ADL.  Patient needs assistance with dressing himself and he has difficulty with walking and climbing stairs.  Patient reports that he does have weakness in his legs at times and therefore uses a wheelchair.    Throughout the assessment, the patients thinking was disorganized and he had to be re-directed several times because he would switch very quickly from one topic to another.  Patient reports that he has been depressed but he has never taken any psychotropic medication to address his depression.  Patient denies any suicidal attempts or inpatient hospitalization.     [name] with [name] reports that she is working with [name] ([name]) to assist the patient with placement.  Writer attempted to contact [name] in order to obtain collateral information but was unsuccessful.  Writer left a Designer, television/film set message. Per [name] the patient was referred to APS  when the patient was in jail for trespassing and smearing feces's on the walls at the jail.  Per [name] the patient is homeless.   Patient denies SI/HI/Substance Abuse.  Patient denies physical, sexual or emotional abuse.      Diagnosis: Schizoaffective Disorder   Past Medical History:  Past Medical History  Diagnosis Date  . Depression   . Fall at nursing home     December 2014  . History of migraine   . Gout attack   . Complete heart block ([place])     St. Jude pacemaker - followed by Dr. Lovena Le  . Chronic atrial fibrillation ([place])   . HLD (hyperlipidemia)   . GIB (gastrointestinal bleeding)   . BPH (benign prostatic hyperplasia)   . Essential hypertension   . Gastric ulcer   . Non Hodgkin's lymphoma ([place])   . Tubular adenoma of colon   . Aspiration pneumonia ([place]) 10/07/2014  . 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 Atypical angina ([place]) 12/01/2014  . BLOCK, AV, COMPLETE 12/24/2006    Annotation: Pacer placed Qualifier: Diagnosis of  By: Hilma Favors  DO, Beth    . 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 Fracture femur, cervicotrochanteric ([place]) 05/27/2013    Past Surgical History  Procedure Laterality Date  . Pacemaker generator  change      Dr. Lovena Le  . Appendectomy    . Cholecystectomy    . Open reduction of hip Right 05/28/2013    Procedure: OPEN REDUCTION OF HIP;  Surgeon: Renette Butters, MD;  Location: [place];  Service: Orthopedics;  Laterality: Right;  . Esophagogastroduodenoscopy (egd) with propofol N/A 09/26/2014    Procedure: ESOPHAGOGASTRODUODENOSCOPY (EGD) WITH PROPOFOL;  Surgeon: Jerene Bears, MD;  Location: [facility] ENDOSCOPY;  Service: Endoscopy;  Laterality: N/A;  . Colonoscopy N/A 10/01/2014    Procedure: COLONOSCOPY;  Surgeon: Jerene Bears, MD;  Location: [facility] ENDOSCOPY;  Service: Endoscopy;  Laterality: N/A;  . Partial  colectomy N/A 10/05/2014    Procedure: SUBTOTAL COLECTOMY WITH ILEORECTAL ANASTOMOSIS;  Surgeon: Donnie Mesa, MD;  Location: [place];  Service: General;  Laterality: N/A;  . Flexible sigmoidoscopy N/A 11/07/2014    Procedure: FLEXIBLE SIGMOIDOSCOPY;  Surgeon: Jerene Bears, MD;  Location: [facility] ENDOSCOPY;  Service: Endoscopy;  Laterality: N/A;  . Gastric resection    . Flexible sigmoidoscopy N/A 07/30/2015    Procedure: FLEXIBLE SIGMOIDOSCOPY;  Surgeon: Doran Stabler, MD;  [facility]: [facility] ENDOSCOPY;  Service: Endoscopy;  Laterality: N/A;  . Transanal excision of rectal mass N/A 08/03/2015    Procedure: TRANSANAL MINIMALLY INVASIVE SURGERY TO RESECT RECTAL POLYP;  Surgeon: [name], MD;  [facility]: [facility];  Service: General;  Laterality: N/A;  . Flexible sigmoidoscopy N/A 08/03/2015    Procedure: FLEXIBLE SIGMOIDOSCOPY;  Surgeon: [name], MD;  [facility];  Service: General;  Laterality: N/A;    Family History:  Family History  Problem Relation Age of Onset  . Colon cancer Mother     Social History:  reports that he has been smoking Cigarettes.  He has a 33 pack-year smoking history. He has quit using smokeless tobacco. His smokeless tobacco use included Snuff and Chew. He reports that he drinks alcohol. He reports that he uses illicit drugs (Marijuana).  Additional Social History:  Alcohol / Drug Use History of alcohol / drug use?: No history of alcohol / drug abuse  CIWA:   COWS:    Allergies: No Known Allergies  Home Medications:  (Not in a hospital admission)  OB/GYN Status:  No LMP for male patient.  General Assessment Data Location of Assessment: [facility] (Walk In at [facility]) TTS Assessment: In system Is this a Tele or Face-to-Face Assessment?: Face-to-Face Is this an Initial Assessment or a Re-assessment for this encounter?: Initial Assessment Marital status: Single Maiden name: None Reported Is patient pregnant?: No Pregnancy Status: No Living  Arrangements: Other (Comment) (Homeless) Can pt return to current living arrangement?: Yes Admission Status: Voluntary Is patient capable of signing voluntary admission?: Yes Referral Source: Self/Family/Friend Insurance type: Medicare  Medical Screening Exam ([place]) Medical Exam completed: No Reason for MSE not completed: Patient Refused (Patient signed MSE Form)  Crisis Care Plan Living Arrangements: Other (Comment) (Homeless) Legal Guardian:  (None Reported) Name of Psychiatrist: None Reported Name of Therapist: None Reported  Education Status Is patient currently in school?: No Current Grade: NA Highest grade of school patient has completed: NA Name of school: NA Contact person: NA  Risk to self with the past 6 months Suicidal Ideation: No Has patient been a risk to self within the past 6 months prior to admission? : No Suicidal Intent: No Has patient had any suicidal intent within the past 6 months prior to admission? : No Is patient at risk for suicide?: No Suicidal Plan?:  No Has patient had any suicidal plan within the past 6 months prior to admission? : No Access to Means: No What has been your use of drugs/alcohol within the last 12 months?: None Reported Previous Attempts/Gestures: No How many times?: 0 Other Self Harm Risks: None Reported Triggers for Past Attempts:  (None Reported) Intentional Self Injurious Behavior: None Family Suicide History: No Recent stressful life event(s):  (None Reported) Persecutory voices/beliefs?: No Depression: Yes Depression Symptoms: Despondent, Fatigue, Feeling worthless/self pity, Feeling angry/irritable Substance abuse history and/or treatment for substance abuse?: No Suicide prevention information given to non-admitted patients: Not applicable  Risk to Others within the past 6 months Homicidal Ideation: No Does patient have any lifetime risk of violence toward others beyond the six months prior to admission? :  No Thoughts of Harm to Others: No Current Homicidal Intent: No Current Homicidal Plan: No Access to Homicidal Means: No Identified Victim: None Reported History of harm to others?: No Assessment of Violence: None Noted Violent Behavior Description: None Reported Does patient have access to weapons?: No Criminal Charges Pending?: No Does patient have a court date: No Is patient on probation?: No  Psychosis Hallucinations: None noted Delusions: None noted  Mental Status Report Appearance/Hygiene: Disheveled, Poor hygiene Eye Contact: Good Motor Activity: Freedom of movement, Restlessness Speech: Soft, Slow Level of Consciousness: Alert, Restless Mood: Depressed, Suspicious Affect: Preoccupied Anxiety Level: None Thought Processes: Coherent, Flight of Ideas Judgement: Unimpaired Orientation: Person, Place, Time, Situation Obsessive Compulsive Thoughts/Behaviors: None  Cognitive Functioning Concentration: Decreased Memory: Recent Impaired, Remote Impaired IQ: Average Insight: Fair Impulse Control: Fair Appetite: Fair Weight Loss: 0 Weight Gain: 0 Sleep: No Change Total Hours of Sleep: 8 Vegetative Symptoms: None  ADLScreening [facility] Assessment Services) Patient's cognitive ability adequate to safely complete daily activities?: Yes Patient able to express need for assistance with ADLs?: Yes Independently performs ADLs?: No  Prior Inpatient Therapy Prior Inpatient Therapy: No Prior Therapy Dates: NA Prior Therapy Facilty/Provider(s): NA Reason for Treatment: NA  Prior Outpatient Therapy Prior Outpatient Therapy: No Prior Therapy Dates: NA Prior Therapy Facilty/Provider(s): NA Reason for Treatment: NA Does patient have an ACCT team?: No Does patient have Intensive In-House Services?  : No Does patient have [facility] services? : No Does patient have P4CC services?: No  ADL Screening (condition at time of admission) Patient's cognitive ability adequate to safely  complete daily activities?: Yes Is the patient deaf or have difficulty hearing?: No Does the patient have difficulty seeing, even when wearing glasses/contacts?: No Does the patient have difficulty concentrating, remembering, or making decisions?: Yes Patient able to express need for assistance with ADLs?: Yes Does the patient have difficulty dressing or bathing?: Yes Independently performs ADLs?: No Communication: Independent Dressing (OT): Independent Grooming: Needs assistance Feeding: Independent Bathing: Needs assistance Toileting: Independent In/Out Bed: Needs assistance Walks in Home: Independent Does the patient have difficulty walking or climbing stairs?: No Weakness of Legs: None Weakness of Arms/Hands: None  Home Assistive Devices/Equipment Home Assistive Devices/Equipment: None    Abuse/Neglect Assessment (Assessment to be complete while patient is alone) Physical Abuse: Denies Verbal Abuse: Denies Sexual Abuse: Denies Exploitation of patient/patient's resources: Denies Self-Neglect: Denies Values / Beliefs Cultural Requests During Hospitalization: None Spiritual Requests During Hospitalization: None Consults Spiritual Care Consult Needed: No Social Work Consult Needed: No Regulatory affairs officer (For Healthcare) Does patient have an advance directive?: No Would patient like information on creating an advanced directive?: No - patient declined information    Additional Information 1:1 In Past 12 Months?: No  CIRT Risk: No Elopement Risk: No Does patient have medical clearance?: Yes  Child/Adolescent Assessment Running Away Risk:  (NA) Bed-Wetting:  (NA) Destruction of Property:  (NA) Cruelty to Animals:  (NA) Stealing:  (NA) Rebellious/Defies Authority:  (NA) Satanic Involvement:  (NA) Fire Setting:  (NA) Problems at School:  (NA) Gang Involvement:  (NA)  Disposition: Per Dr.Kumar - patient does not meet criteria for inpatient hospitalization.  Probation officer  provided resources for [employer].  Patient will continue to follow up with [facility] and the intake guardian [name]) for housing. Patient signed the decline MSE Form.  Disposition Initial Assessment Completed for this Encounter:  (NA) Disposition of Patient:  (NA)  On Site Evaluation by:   Reviewed with Physician:    Graciella Freer [place] 11/09/2015 1:19 PM

## 2015-11-10 ENCOUNTER — Emergency Department (HOSPITAL_COMMUNITY)
Admission: EM | Admit: 2015-11-10 | Discharge: 2015-11-10 | Disposition: A | Discharge status: Home / Self Care | Payer: Medicare Other | Source: Home / Self Care

## 2015-11-10 ENCOUNTER — Encounter (HOSPITAL_COMMUNITY): Payer: Self-pay | Admitting: Emergency Medicine

## 2015-11-10 ENCOUNTER — Other Ambulatory Visit: Payer: Self-pay

## 2015-11-10 ENCOUNTER — Emergency Department (HOSPITAL_COMMUNITY): Payer: Medicare Other

## 2015-11-10 ENCOUNTER — Emergency Department (HOSPITAL_COMMUNITY)
Admission: EM | Admit: 2015-11-10 | Discharge: 2015-11-10 | Discharge status: Against Medical Advice | Payer: Medicare Other | Attending: Emergency Medicine | Admitting: Emergency Medicine

## 2015-11-10 ENCOUNTER — Encounter (HOSPITAL_COMMUNITY): Payer: Self-pay | Admitting: *Deleted

## 2015-11-10 DIAGNOSIS — R0602 Shortness of breath: Secondary | ICD-10-CM | POA: Diagnosis not present

## 2015-11-10 DIAGNOSIS — Z8659 Personal history of other mental and behavioral disorders: Secondary | ICD-10-CM | POA: Insufficient documentation

## 2015-11-10 DIAGNOSIS — R079 Chest pain, unspecified: Secondary | ICD-10-CM | POA: Insufficient documentation

## 2015-11-10 DIAGNOSIS — Z86018 Personal history of other benign neoplasm: Secondary | ICD-10-CM | POA: Diagnosis not present

## 2015-11-10 DIAGNOSIS — Z8739 Personal history of other diseases of the musculoskeletal system and connective tissue: Secondary | ICD-10-CM | POA: Diagnosis not present

## 2015-11-10 DIAGNOSIS — Z87438 Personal history of other diseases of male genital organs: Secondary | ICD-10-CM | POA: Insufficient documentation

## 2015-11-10 DIAGNOSIS — Z7952 Long term (current) use of systemic steroids: Secondary | ICD-10-CM | POA: Insufficient documentation

## 2015-11-10 DIAGNOSIS — Z8639 Personal history of other endocrine, nutritional and metabolic disease: Secondary | ICD-10-CM | POA: Insufficient documentation

## 2015-11-10 DIAGNOSIS — M25562 Pain in left knee: Secondary | ICD-10-CM

## 2015-11-10 DIAGNOSIS — F17210 Nicotine dependence, cigarettes, uncomplicated: Secondary | ICD-10-CM

## 2015-11-10 DIAGNOSIS — Z7982 Long term (current) use of aspirin: Secondary | ICD-10-CM | POA: Insufficient documentation

## 2015-11-10 DIAGNOSIS — I10 Essential (primary) hypertension: Secondary | ICD-10-CM

## 2015-11-10 DIAGNOSIS — Z8719 Personal history of other diseases of the digestive system: Secondary | ICD-10-CM | POA: Insufficient documentation

## 2015-11-10 DIAGNOSIS — M25551 Pain in right hip: Secondary | ICD-10-CM

## 2015-11-10 DIAGNOSIS — Z8701 Personal history of pneumonia (recurrent): Secondary | ICD-10-CM | POA: Insufficient documentation

## 2015-11-10 DIAGNOSIS — Y9289 Other specified places as the place of occurrence of the external cause: Secondary | ICD-10-CM | POA: Diagnosis not present

## 2015-11-10 DIAGNOSIS — I482 Chronic atrial fibrillation: Secondary | ICD-10-CM | POA: Diagnosis not present

## 2015-11-10 DIAGNOSIS — S79912A Unspecified injury of left hip, initial encounter: Secondary | ICD-10-CM | POA: Insufficient documentation

## 2015-11-10 DIAGNOSIS — Z8572 Personal history of non-Hodgkin lymphomas: Secondary | ICD-10-CM | POA: Diagnosis not present

## 2015-11-10 DIAGNOSIS — W1839XA Other fall on same level, initial encounter: Secondary | ICD-10-CM | POA: Diagnosis not present

## 2015-11-10 DIAGNOSIS — Y998 Other external cause status: Secondary | ICD-10-CM | POA: Insufficient documentation

## 2015-11-10 DIAGNOSIS — Z79899 Other long term (current) drug therapy: Secondary | ICD-10-CM | POA: Diagnosis not present

## 2015-11-10 DIAGNOSIS — S8992XA Unspecified injury of left lower leg, initial encounter: Secondary | ICD-10-CM | POA: Diagnosis not present

## 2015-11-10 DIAGNOSIS — Y9389 Activity, other specified: Secondary | ICD-10-CM | POA: Diagnosis not present

## 2015-11-10 DIAGNOSIS — M25552 Pain in left hip: Secondary | ICD-10-CM

## 2015-11-10 NOTE — ED Provider Notes (Signed)
CSN: [id number]     Arrival date & time 11/10/15  1848 History  By signing my name below, I, [name], attest that this documentation has been prepared under the direction and in the presence of non-physician practitioner, Hyman Bible, PA-C. Electronically Signed: [name], Scribe. 11/10/2015. 7:50 PM.   Chief Complaint  Patient presents with  . Hip Pain   The history is provided by the patient. No language interpreter was used.   HPI Comments:  [name] is a 70 y.o. male with PMHx of A Fib, HLD and HTN who presents to the Emergency Department complaining of left knee and lower back pain. He states he fell 4 times today, falling on his left hip and knee; he states fall occurred this morning, then later states fall occurred at 3pm today. He denies syncope. He states he took Tylenol this morning. Pt has been ambulatory since the incident, but reports significant pain with ambulation.  Pt left without being seen earlier today and has visited the ED 61 times in the past 6 months. Pt also requests an inhaler because he is having difficulty breathing.    Past Medical History  Diagnosis Date  . Depression   . Fall at nursing home     December 2014  . History of migraine   . Gout attack   . Complete heart block ([place])     St. Jude pacemaker - followed by Dr. Lovena Le  . Chronic atrial fibrillation ([place])   . HLD (hyperlipidemia)   . GIB (gastrointestinal bleeding)   . BPH (benign prostatic hyperplasia)   . Essential hypertension   . Gastric ulcer   . Non Hodgkin's lymphoma ([place])   . Tubular adenoma of colon   . Aspiration pneumonia ([place]) 10/07/2014  . 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 Atypical angina ([place]) 12/01/2014  . BLOCK, AV, COMPLETE 12/24/2006    Annotation: Pacer placed Qualifier: Diagnosis of  By: Hilma Favors  DO, Beth    . 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 Fracture femur, cervicotrochanteric ([place]) 05/27/2013   Past Surgical History  Procedure Laterality Date  . Pacemaker generator change      Dr. Lovena Le  . Appendectomy    . Cholecystectomy    . Open reduction of hip Right 05/28/2013    Procedure: OPEN REDUCTION OF HIP;  Surgeon: Renette Butters, MD;  Location: [place];  Service: Orthopedics;  Laterality: Right;  . Esophagogastroduodenoscopy (egd) with propofol N/A 09/26/2014    Procedure: ESOPHAGOGASTRODUODENOSCOPY (EGD) WITH PROPOFOL;  Surgeon: Jerene Bears, MD;  Location: [facility] ENDOSCOPY;  Service: Endoscopy;  Laterality: N/A;  . Colonoscopy N/A 10/01/2014    Procedure: COLONOSCOPY;  Surgeon: Jerene Bears, MD;  Location: [facility] ENDOSCOPY;  Service: Endoscopy;  Laterality: N/A;  . Partial colectomy N/A 10/05/2014    Procedure: SUBTOTAL COLECTOMY WITH ILEORECTAL ANASTOMOSIS;  Surgeon: Donnie Mesa, MD;  Location: [place];  Service: General;  Laterality: N/A;  . Flexible sigmoidoscopy N/A 11/07/2014    Procedure: FLEXIBLE SIGMOIDOSCOPY;  Surgeon: Jerene Bears, MD;  Location: [facility] ENDOSCOPY;  Service: Endoscopy;  Laterality: N/A;  . Gastric resection    . Flexible sigmoidoscopy N/A 07/30/2015    Procedure: FLEXIBLE SIGMOIDOSCOPY;  Surgeon: Doran Stabler, MD;  [facility] ENDOSCOPY;  Service: Endoscopy;  Laterality: N/A;  .  Transanal excision of rectal mass N/A 08/03/2015    Procedure: TRANSANAL MINIMALLY INVASIVE SURGERY TO RESECT RECTAL POLYP;  Surgeon: [name], MD;  [facility];  Service: General;  Laterality: N/A;  . Flexible sigmoidoscopy N/A 08/03/2015    Procedure: FLEXIBLE SIGMOIDOSCOPY;  Surgeon: [name], MD;  [facility];  Service: General;  Laterality: N/A;   Family History  Problem Relation Age of Onset  . Colon cancer Mother    Social History  Substance Use Topics  . Smoking status: Current Every Day Smoker -- 0.50 packs/day for 66 years    Types: Cigarettes  . Smokeless  tobacco: Former Systems developer    Types: Snuff, Chew     Comment: 05/27/2013 "haven't used chew or snuff in ~ 30 yr"  . Alcohol Use: 0.0 oz/week    0 Standard drinks or equivalent per week     Comment: licqor monthly    Review of Systems  Respiratory: Positive for shortness of breath.   Musculoskeletal: Positive for back pain and arthralgias.  Neurological: Negative for syncope.    Allergies  Review of patient's allergies indicates no known allergies.  Home Medications   Prior to Admission medications   Medication Sig Start Date End Date Taking? Authorizing Provider  albuterol (PROVENTIL HFA;VENTOLIN HFA) 108 (90 Base) MCG/ACT inhaler Inhale 2 puffs into the lungs every 4 (four) hours as needed for wheezing or shortness of breath. 08/07/15   Venetia Maxon Rama, MD  aspirin EC 81 MG tablet Take 81 mg by mouth daily.    Historical Provider, MD  carvedilol (COREG) 6.25 MG tablet Take 1 tablet (6.25 mg total) by mouth 2 (two) times daily with a meal. 09/23/15   Blanchie Dessert, MD  diazepam (VALIUM) 5 MG tablet Take 5 mg by mouth every 12 (twelve) hours as needed for anxiety.    Historical Provider, MD  diphenoxylate-atropine (LOMOTIL) 2.5-0.025 MG tablet Take 1 tablet by mouth 4 (four) times daily as needed for diarrhea or loose stools. 09/25/15   Tanna Furry, MD  feeding supplement, ENSURE ENLIVE, (ENSURE ENLIVE) LIQD Take 237 mLs by mouth 2 (two) times daily between meals. 08/07/15   Venetia Maxon Rama, MD  omeprazole (PRILOSEC) 40 MG capsule Take 40 mg by mouth daily.    Historical Provider, MD  oxyCODONE-acetaminophen (PERCOCET/ROXICET) 5-325 MG tablet Take 1 tablet by mouth every 6 (six) hours as needed for severe pain.    Historical Provider, MD  predniSONE (DELTASONE) 20 MG tablet Take 3 tablets (60 mg total) by mouth daily. 10/27/15   Kristen N Ward, DO   BP 137/107 mmHg  Pulse 105  Temp(Src) 98.8 F (37.1 C) (Oral)  Resp 15  Ht 5\' 9"  (1.753 m)  Wt 107 lb (48.535 kg)  BMI 15.79 kg/m2  SpO2  99%   Physical Exam  Constitutional: He appears well-developed and well-nourished. No distress.  HENT:  Head: Normocephalic and atraumatic.  Eyes: Conjunctivae are normal. Pupils are equal, round, and reactive to light. Right eye exhibits no discharge. Left eye exhibits no discharge.  Neck: Neck supple.  Cardiovascular: Normal rate, regular rhythm, normal heart sounds and intact distal pulses.   Pulmonary/Chest: Effort normal and breath sounds normal. No respiratory distress.  Abdominal: Soft. There is no tenderness.  Musculoskeletal:  Full ROM of left knee. Full ROM of the left hip.  Mild erythema and TTP of left knee; Tenderness to palpation of the left hip.   full ROM of left ankle  Lymphadenopathy:    He has  no cervical adenopathy.  Neurological: He is alert. Coordination normal.  Skin: Skin is warm and dry. No rash noted. He is not diaphoretic.  Psychiatric: He has a normal mood and affect. His behavior is normal.  Nursing note and vitals reviewed.   ED Course  Procedures  DIAGNOSTIC STUDIES:  Oxygen Saturation is 99% on RA, normal by my interpretation.    COORDINATION OF CARE:  7:49 PM Will order an x-ray of left knee and hip. Discussed treatment plan with pt at bedside and pt agreed to plan.  Labs Review Labs Reviewed - No data to display  Imaging Review Dg Knee Complete 4 Views Left  11/10/2015  CLINICAL DATA:  Pain following recent falls EXAM: LEFT KNEE - COMPLETE 4+ VIEW COMPARISON:  None. FINDINGS: Frontal, lateral, and bilateral oblique views were obtained. There is no demonstrable fracture or dislocation. 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 There is no fracture or dislocation. There is mild narrowing of the patellofemoral joint. No erosive change. There are scattered foci of arterial vascular calcification. IMPRESSION: Mild narrowing patellofemoral joint. No acute fracture or dislocation. No joint effusion. Bipartite patella, an anatomic  variant. Electronically Signed   By: Lowella Grip III M.D.   On: 11/10/2015 20:29   Dg Hip Unilat With Pelvis 2-3 Views Left  11/10/2015  CLINICAL DATA:  Pain following several recent falls EXAM: DG HIP (WITH OR WITHOUT PELVIS) 2-3V LEFT COMPARISON:  April 13, 2015 FINDINGS: Frontal pelvis as well as frontal and lateral left hip images were obtained. Postoperative changes noted on the right, stable. There is no acute fracture or dislocation. The joint spaces appear normal. No erosive change. There is extensive arterial vascular calcification. IMPRESSION: No acute fracture or dislocation. Joint spaces appear symmetric. Postoperative change on the right is stable. There is extensive arterial vascular calcification. Electronically Signed   By: Lowella Grip III M.D.   On: 11/10/2015 20:31   I have personally reviewed and evaluated these images and lab results as part of my medical decision-making.   EKG Interpretation None      MDM   Final diagnoses:  None   Patient presents today with left hip pain and left knee pain that has been present since a fall that occurred earlier today.  Xrays are negative.  Patient initially stated that he was having difficulty ambulating.  However, he was seen walking out of the ED prior to discharge without difficulty.     Hyman Bible, PA-C 11/11/15 0019  Leo Grosser, MD 11/11/15 [phone]

## 2015-11-10 NOTE — ED Notes (Signed)
Pt called multiple times for vital sign reassessment with no answer.

## 2015-11-10 NOTE — Progress Notes (Signed)
CSW received T/C from [name], [place] w/ [facility] at the [facility] [phone]). She informed CSW that Patient is on his way up here. She reports concern that Patient has poor judgement and needs to be evaluated psychiatrically and for cognitive dementia/impairment. She reports that Patient was evaluated on yesterday at [facility] by Ava Johnnye Sima and Dr. Dwyane Dee and requests that EDP contact Dr. Dwyane Dee with any questions. 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 She reports that she is currently working on long term placement for this patient but is need of an updated FL2. CSW will assist as needed.     Emiliano Dyer, LCSW [facility] ED/11M Clinical Social Worker [phone]

## 2015-11-10 NOTE — ED Notes (Signed)
Pt here via [facility] for R hip pain and chest pain.

## 2015-11-10 NOTE — NC FL2 (Cosign Needed)
[place] LEVEL OF CARE SCREENING TOOL     IDENTIFICATION  Patient Name: [name] Birthdate: [birth year] Sex: male Admission Date (Current Location): (Not on file)  [place] and [place] Number:  Herbalist and Address:  [facility] [place] [address], [place], [place] [address]      Provider Number: [id number]  Attending Physician Name and Address:  No att. providers found  Relative Name and Phone Number:  [place] [phone]; [name] (Intake guardianship [phone])     Current Level of Care: Hospital Recommended Level of Care: [place], [place], Memory Care Prior Approval Number:    Date Approved/Denied:   PASRR Number:    Discharge Plan: SNF ([place])    Current Diagnoses: Patient Active Problem List   Diagnosis Date Noted  . Ileus, postoperative 08/05/2015  . Chest pain 08/05/2015  . COPD exacerbation ([place]) 08/04/2015  . Homelessness 08/04/2015  . Dysplastic rectal polyp   . Bleeding rectal polyp s/p partial proctectomy 08/03/2015   . Chronic abdominal pain   . 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 Pressure ulcer 11/16/2014  . Hx of colectomy   . History of adenomatous polyp of colon   . Rectal mass   . Essential hypertension   . Rectal bleeding   . Acute blood loss anemia   . Delirium   . Protein-calorie malnutrition, severe ([place]) 10/08/2014  . Hematochezia   . Sigmoid & cecal bleeding colon masses s/p abdominal colectomy 10/05/2014   . HLD (hyperlipidemia) 09/30/2014  . Depression 09/30/2014  . Abdominal pain 09/30/2014  . BPH (benign prostatic hyperplasia) 09/29/2014  . Thrombocytopenia ([place]) 12/24/2006  . DEPRESSION, MAJOR, MODERATE 12/24/2006  . LEARNING DISABILITY 12/24/2006  . 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 CARDIOMYOPATHY 12/24/2006  . BLOCK, AV, COMPLETE 12/24/2006  . ATHEROSCLEROSIS,  AORTIC 12/24/2006  . Atrial fibrillation ([place]) 12/24/2006  . History of other specified conditions presenting hazards to health 12/24/2006  . CHOLECYSTECTOMY, HX OF 12/24/2006    Orientation RESPIRATION BLADDER Height & Weight     Self, Time, Place  Normal Continent Weight:   Height:     BEHAVIORAL SYMPTOMS/MOOD NEUROLOGICAL BOWEL NUTRITION STATUS  [place]   Incontinent Diet (Heart Healthy Diet)  AMBULATORY STATUS COMMUNICATION OF NEEDS Skin   Limited Assist Verbally Normal                       Personal Care Assistance Level of Assistance  Bathing, Dressing Bathing Assistance: Limited assistance   Dressing Assistance: Limited assistance     Functional Limitations Info  Sight, Hearing, Speech Sight Info: Adequate Hearing Info: Adequate Speech Info: Adequate    SPECIAL CARE FACTORS FREQUENCY  PT (By licensed PT), OT (By licensed OT)                    Contractures Contractures Info: Not present    Additional Factors Info  Code Status, Allergies Code Status Info: FULL Allergies Info: No Known Allergies           Current Medications (11/10/2015):  This is the current hospital active medication list No current facility-administered medications for this encounter.   Current Outpatient Prescriptions  Medication Sig Dispense Refill  . albuterol (PROVENTIL HFA;VENTOLIN HFA) 108 (90 Base) MCG/ACT inhaler Inhale 2 puffs into the lungs every 4 (four) hours as needed for wheezing or shortness of breath. 1 Inhaler  0  . aspirin EC 81 MG tablet Take 81 mg by mouth daily.    . carvedilol (COREG) 6.25 MG tablet Take 1 tablet (6.25 mg total) by mouth 2 (two) times daily with a meal. 60 tablet 0  . diazepam (VALIUM) 5 MG tablet Take 5 mg by mouth every 12 (twelve) hours as needed for anxiety.    . diphenoxylate-atropine (LOMOTIL) 2.5-0.025 MG tablet Take 1 tablet by mouth 4 (four) times daily as needed for diarrhea or loose stools. 30 tablet 0  . feeding supplement,  ENSURE ENLIVE, (ENSURE ENLIVE) LIQD Take 237 mLs by mouth 2 (two) times daily between meals. 237 mL 12  . omeprazole (PRILOSEC) 40 MG capsule Take 40 mg by mouth daily.    Marland Kitchen oxyCODONE-acetaminophen (PERCOCET/ROXICET) 5-325 MG tablet Take 1 tablet by mouth every 6 (six) hours as needed for severe pain.    . predniSONE (DELTASONE) 20 MG tablet Take 3 tablets (60 mg total) by mouth daily. 15 tablet 0     Discharge Medications: Please see discharge summary for a list of discharge medications.  Relevant Imaging Results:  Relevant Lab Results:   Additional Information SSN: [id number]  Judeth Horn, LCSW

## 2015-11-10 NOTE — ED Notes (Signed)
Pt here for left hip pain.

## 2015-11-11 ENCOUNTER — Encounter (HOSPITAL_COMMUNITY): Payer: Self-pay | Admitting: Nurse Practitioner

## 2015-11-11 ENCOUNTER — Emergency Department (HOSPITAL_COMMUNITY)
Admission: EM | Admit: 2015-11-11 | Discharge: 2015-11-11 | Disposition: A | Discharge status: Home / Self Care | Payer: Medicare Other | Attending: Emergency Medicine | Admitting: Emergency Medicine

## 2015-11-11 ENCOUNTER — Encounter (HOSPITAL_COMMUNITY): Payer: Self-pay | Admitting: Emergency Medicine

## 2015-11-11 DIAGNOSIS — Z8719 Personal history of other diseases of the digestive system: Secondary | ICD-10-CM | POA: Diagnosis not present

## 2015-11-11 DIAGNOSIS — Z7952 Long term (current) use of systemic steroids: Secondary | ICD-10-CM | POA: Insufficient documentation

## 2015-11-11 DIAGNOSIS — Z8781 Personal history of (healed) traumatic fracture: Secondary | ICD-10-CM | POA: Diagnosis not present

## 2015-11-11 DIAGNOSIS — Z7982 Long term (current) use of aspirin: Secondary | ICD-10-CM | POA: Insufficient documentation

## 2015-11-11 DIAGNOSIS — W01198A Fall on same level from slipping, tripping and stumbling with subsequent striking against other object, initial encounter: Secondary | ICD-10-CM | POA: Insufficient documentation

## 2015-11-11 DIAGNOSIS — S79912A Unspecified injury of left hip, initial encounter: Secondary | ICD-10-CM | POA: Diagnosis present

## 2015-11-11 DIAGNOSIS — Z79899 Other long term (current) drug therapy: Secondary | ICD-10-CM | POA: Insufficient documentation

## 2015-11-11 DIAGNOSIS — I10 Essential (primary) hypertension: Secondary | ICD-10-CM | POA: Insufficient documentation

## 2015-11-11 DIAGNOSIS — Z8572 Personal history of non-Hodgkin lymphomas: Secondary | ICD-10-CM | POA: Diagnosis not present

## 2015-11-11 DIAGNOSIS — S3992XA Unspecified injury of lower back, initial encounter: Secondary | ICD-10-CM | POA: Insufficient documentation

## 2015-11-11 DIAGNOSIS — Y9289 Other specified places as the place of occurrence of the external cause: Secondary | ICD-10-CM | POA: Diagnosis not present

## 2015-11-11 DIAGNOSIS — I252 Old myocardial infarction: Secondary | ICD-10-CM | POA: Diagnosis not present

## 2015-11-11 DIAGNOSIS — I482 Chronic atrial fibrillation: Secondary | ICD-10-CM | POA: Diagnosis not present

## 2015-11-11 DIAGNOSIS — Z8701 Personal history of pneumonia (recurrent): Secondary | ICD-10-CM | POA: Insufficient documentation

## 2015-11-11 DIAGNOSIS — Y998 Other external cause status: Secondary | ICD-10-CM | POA: Diagnosis not present

## 2015-11-11 DIAGNOSIS — F17201 Nicotine dependence, unspecified, in remission: Secondary | ICD-10-CM | POA: Diagnosis not present

## 2015-11-11 DIAGNOSIS — Z950 Presence of cardiac pacemaker: Secondary | ICD-10-CM | POA: Insufficient documentation

## 2015-11-11 DIAGNOSIS — M25552 Pain in left hip: Secondary | ICD-10-CM

## 2015-11-11 DIAGNOSIS — Z8639 Personal history of other endocrine, nutritional and metabolic disease: Secondary | ICD-10-CM | POA: Insufficient documentation

## 2015-11-11 DIAGNOSIS — Z87438 Personal history of other diseases of male genital organs: Secondary | ICD-10-CM | POA: Diagnosis not present

## 2015-11-11 DIAGNOSIS — Z8659 Personal history of other mental and behavioral disorders: Secondary | ICD-10-CM | POA: Insufficient documentation

## 2015-11-11 DIAGNOSIS — M109 Gout, unspecified: Secondary | ICD-10-CM | POA: Insufficient documentation

## 2015-11-11 DIAGNOSIS — Z86010 Personal history of colonic polyps: Secondary | ICD-10-CM | POA: Insufficient documentation

## 2015-11-11 DIAGNOSIS — Y9389 Activity, other specified: Secondary | ICD-10-CM | POA: Diagnosis not present

## 2015-11-11 DIAGNOSIS — R079 Chest pain, unspecified: Secondary | ICD-10-CM | POA: Insufficient documentation

## 2015-11-11 NOTE — ED Notes (Signed)
Pt asleep in lobby, difficult to arouse by name, tapped chair leg to wake up.  Pt covered in feces, wrapped in blankets also covered in feces c/o left knee pain.

## 2015-11-11 NOTE — ED Notes (Signed)
Pt stable, ambulatory, states understanding of discharge instructions 

## 2015-11-11 NOTE — ED Notes (Signed)
Pt went outside upon arriving at triage.

## 2015-11-11 NOTE — Discharge Instructions (Signed)

## 2015-11-11 NOTE — ED Provider Notes (Signed)
CSN: [id number]     Arrival date & time 11/11/15  0031 History   First MD Initiated Contact with Patient 11/11/15 0448     Chief Complaint  Patient presents with  . Hip Pain      Patient is a 70 y.o. male presenting with hip pain. The history is provided by the patient.  Hip Pain Pertinent negatives include no abdominal pain and no shortness of breath.  Patient presents with left hip pain. Reportedly has fallen 8 times recently. Seen in the ER around 8 hours ago for the same thing. He has had 62 visits the last 6 months. States his left hip hurts. Denies other injury. States he did fall and did hit his head. No numbness or weakness. No confusion. States she's been unable to walk but also ambulated out of the ER earlier with his previous visit  Past Medical History  Diagnosis Date  . Depression   . Fall at nursing home     December 2014  . History of migraine   . Gout attack   . Complete heart block ([place])     St. Jude pacemaker - followed by Dr. Lovena Le  . Chronic atrial fibrillation ([place])   . HLD (hyperlipidemia)   . GIB (gastrointestinal bleeding)   . BPH (benign prostatic hyperplasia)   . Essential hypertension   . Gastric ulcer   . Non Hodgkin's lymphoma ([place])   . Tubular adenoma of colon   . Aspiration pneumonia ([place]) 10/07/2014  . 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 Atypical angina ([place]) 12/01/2014  . BLOCK, AV, COMPLETE 12/24/2006    Annotation: Pacer placed Qualifier: Diagnosis of  By: Hilma Favors  DO, Beth    . 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 Fracture femur, cervicotrochanteric ([place]) 05/27/2013   Past Surgical History  Procedure Laterality Date  . Pacemaker generator change      Dr. Lovena Le  . Appendectomy    . Cholecystectomy    . Open reduction of hip Right 05/28/2013    Procedure: OPEN REDUCTION OF HIP;  Surgeon:  Renette Butters, MD;  Location: [facility];  Service: Orthopedics;  Laterality: Right;  . Esophagogastroduodenoscopy (egd) with propofol N/A 09/26/2014    Procedure: ESOPHAGOGASTRODUODENOSCOPY (EGD) WITH PROPOFOL;  Surgeon: Jerene Bears, MD;  Location: [facility] ENDOSCOPY;  Service: Endoscopy;  Laterality: N/A;  . Colonoscopy N/A 10/01/2014    Procedure: COLONOSCOPY;  Surgeon: Jerene Bears, MD;  Location: [facility] ENDOSCOPY;  Service: Endoscopy;  Laterality: N/A;  . Partial colectomy N/A 10/05/2014    Procedure: SUBTOTAL COLECTOMY WITH ILEORECTAL ANASTOMOSIS;  Surgeon: Donnie Mesa, MD;  Location: [facility];  Service: General;  Laterality: N/A;  . Flexible sigmoidoscopy N/A 11/07/2014    Procedure: FLEXIBLE SIGMOIDOSCOPY;  Surgeon: Jerene Bears, MD;  Location: [facility] ENDOSCOPY;  Service: Endoscopy;  Laterality: N/A;  . Gastric resection    . Flexible sigmoidoscopy N/A 07/30/2015    Procedure: FLEXIBLE SIGMOIDOSCOPY;  Surgeon: Doran Stabler, MD;  Location: [facility] ENDOSCOPY;  Service: Endoscopy;  Laterality: N/A;  . Transanal excision of rectal mass N/A 08/03/2015    Procedure: TRANSANAL MINIMALLY INVASIVE SURGERY TO RESECT RECTAL POLYP;  Surgeon: [name], MD;  Location: [facility] ORS;  Service: General;  Laterality: N/A;  . Flexible sigmoidoscopy N/A 08/03/2015    Procedure: FLEXIBLE SIGMOIDOSCOPY;  Surgeon: [name], MD;  Location: [facility] ORS;  Service: General;  Laterality: N/A;   Family History  Problem Relation Age of Onset  . Colon cancer Mother    Social History  Substance Use Topics  . Smoking status: Current Every Day Smoker -- 0.50 packs/day for 66 years    Types: Cigarettes  . Smokeless tobacco: Former Systems developer    Types: Snuff, Chew     Comment: 05/27/2013 "haven't used chew or snuff in ~ 30 yr"  . Alcohol Use: 0.0 oz/week    0 Standard drinks or equivalent per week     Comment: licqor monthly    Review of Systems  Constitutional: Negative for appetite change.  Respiratory: Negative for shortness of breath.    Gastrointestinal: Negative for abdominal pain.  Musculoskeletal: Positive for back pain.       Left hip pain  Skin: Negative for pallor.  Neurological: Negative for seizures.      Allergies  Review of patient's allergies indicates no known allergies.  Home Medications   Prior to Admission medications   Medication Sig Start Date End Date Taking? Authorizing Provider  albuterol (PROVENTIL HFA;VENTOLIN HFA) 108 (90 Base) MCG/ACT inhaler Inhale 2 puffs into the lungs every 4 (four) hours as needed for wheezing or shortness of breath. 08/07/15   Venetia Maxon Rama, MD  aspirin EC 81 MG tablet Take 81 mg by mouth daily.    Historical Provider, MD  carvedilol (COREG) 6.25 MG tablet Take 1 tablet (6.25 mg total) by mouth 2 (two) times daily with a meal. 09/23/15   Blanchie Dessert, MD  diazepam (VALIUM) 5 MG tablet Take 5 mg by mouth every 12 (twelve) hours as needed for anxiety.    Historical Provider, MD  diphenoxylate-atropine (LOMOTIL) 2.5-0.025 MG tablet Take 1 tablet by mouth 4 (four) times daily as needed for diarrhea or loose stools. 09/25/15   Tanna Furry, MD  feeding supplement, ENSURE ENLIVE, (ENSURE ENLIVE) LIQD Take 237 mLs by mouth 2 (two) times daily between meals. 08/07/15   Venetia Maxon Rama, MD  omeprazole (PRILOSEC) 40 MG capsule Take 40 mg by mouth daily.    Historical Provider, MD  oxyCODONE-acetaminophen (PERCOCET/ROXICET) 5-325 MG tablet Take 1 tablet by mouth every 6 (six) hours as needed for severe pain.    Historical Provider, MD  predniSONE (DELTASONE) 20 MG tablet Take 3 tablets (60 mg total) by mouth daily. 10/27/15   Kristen N Ward, DO   BP 161/78 mmHg  Pulse 99  Temp(Src) 98.8 F (37.1 C) (Oral)  Resp 20  SpO2 96% Physical Exam  Constitutional: He appears well-developed.  Somewhat cachectic  HENT:  Head: Atraumatic.  Neck: Neck supple.  Cardiovascular: Normal rate.   Pulmonary/Chest: Effort normal.  Abdominal: Soft. There is no tenderness.  Musculoskeletal:   No tenderness to left hip. Mildly decreased range of motion but patient is not somewhat not cooperative with exam. Neurovascular intact bilateral feet. Patient has urinated on himself  Neurological: He is alert.  Skin: There is erythema.    ED Course  Procedures (including critical care time) Labs Review Labs Reviewed - No data to display  Imaging Review Dg Knee Complete 4 Views Left  11/10/2015  CLINICAL DATA:  Pain following recent falls EXAM: LEFT KNEE - COMPLETE 4+ VIEW COMPARISON:  None. FINDINGS: Frontal, lateral, and bilateral oblique views were obtained. There is no demonstrable fracture or dislocation. 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 There is no fracture or dislocation. There is mild  narrowing of the patellofemoral joint. No erosive change. There are scattered foci of arterial vascular calcification. IMPRESSION: Mild narrowing patellofemoral joint. No acute fracture or dislocation. No joint effusion. Bipartite patella, an anatomic variant. Electronically Signed   By: Lowella Grip III M.D.   On: 11/10/2015 20:29   Dg Hip Unilat With Pelvis 2-3 Views Left  11/10/2015  CLINICAL DATA:  Pain following several recent falls EXAM: DG HIP (WITH OR WITHOUT PELVIS) 2-3V LEFT COMPARISON:  April 13, 2015 FINDINGS: Frontal pelvis as well as frontal and lateral left hip images were obtained. Postoperative changes noted on the right, stable. There is no acute fracture or dislocation. The joint spaces appear normal. No erosive change. There is extensive arterial vascular calcification. IMPRESSION: No acute fracture or dislocation. Joint spaces appear symmetric. Postoperative change on the right is stable. There is extensive arterial vascular calcification. Electronically Signed   By: Lowella Grip III M.D.   On: 11/10/2015 20:31   I have personally reviewed and evaluated these images and lab results as part of my medical decision-making.   EKG Interpretation None       MDM   Final diagnoses:  Hip pain, left    Patient with left hip pain. Negative x-ray earlier today. 62 visits in the last 6 months. Overall benign exam. At this point I do not think he needs a CT for further evaluation. Will discharge.    Davonna Belling, MD 11/11/15 [phone]

## 2015-11-11 NOTE — ED Notes (Addendum)
Per EMS pt has L hip pain.  Pt states he is suppose to have surgery on L hip tomorrow but there is no records of same.  Pt was seen here earlier tonight for same.

## 2015-11-11 NOTE — ED Notes (Signed)
Pt c/o CP that has continued since his discharge this morning. He is alert and breathing easily. He has had multiple visits here this week for chest pain

## 2015-11-11 NOTE — ED Notes (Signed)
Pt is requesting a [place] sandwich and coke. Pt states he has not eat in 4 days. This tech ask triage RN Santiago Glad and she says not until pt sees a Dr.

## 2015-11-19 ENCOUNTER — Encounter (HOSPITAL_COMMUNITY): Payer: Self-pay

## 2015-11-19 ENCOUNTER — Emergency Department (HOSPITAL_COMMUNITY)
Admission: EM | Admit: 2015-11-19 | Discharge: 2015-11-19 | Disposition: A | Discharge status: Home / Self Care | Payer: Medicare Other | Attending: Emergency Medicine | Admitting: Emergency Medicine

## 2015-11-19 DIAGNOSIS — M79604 Pain in right leg: Secondary | ICD-10-CM | POA: Diagnosis not present

## 2015-11-19 DIAGNOSIS — I10 Essential (primary) hypertension: Secondary | ICD-10-CM | POA: Insufficient documentation

## 2015-11-19 DIAGNOSIS — F329 Major depressive disorder, single episode, unspecified: Secondary | ICD-10-CM | POA: Insufficient documentation

## 2015-11-19 DIAGNOSIS — Z79899 Other long term (current) drug therapy: Secondary | ICD-10-CM | POA: Insufficient documentation

## 2015-11-19 DIAGNOSIS — Z8572 Personal history of non-Hodgkin lymphomas: Secondary | ICD-10-CM | POA: Insufficient documentation

## 2015-11-19 DIAGNOSIS — Z139 Encounter for screening, unspecified: Secondary | ICD-10-CM

## 2015-11-19 DIAGNOSIS — F17210 Nicotine dependence, cigarettes, uncomplicated: Secondary | ICD-10-CM | POA: Diagnosis not present

## 2015-11-19 DIAGNOSIS — Z7982 Long term (current) use of aspirin: Secondary | ICD-10-CM | POA: Diagnosis not present

## 2015-11-19 DIAGNOSIS — E860 Dehydration: Secondary | ICD-10-CM | POA: Diagnosis present

## 2015-11-19 DIAGNOSIS — E785 Hyperlipidemia, unspecified: Secondary | ICD-10-CM | POA: Diagnosis not present

## 2015-11-19 DIAGNOSIS — I482 Chronic atrial fibrillation: Secondary | ICD-10-CM | POA: Insufficient documentation

## 2015-11-19 DIAGNOSIS — M79605 Pain in left leg: Secondary | ICD-10-CM | POA: Insufficient documentation

## 2015-11-19 HISTORY — DX: Malingerer (conscious simulation): Z76.5

## 2015-11-19 HISTORY — DX: Chest pain, unspecified: R07.9

## 2015-11-19 HISTORY — DX: Other chronic pain: G89.29

## 2015-11-19 LAB — CBC WITH DIFFERENTIAL/PLATELET
BASOS ABS: 0 10*3/uL (ref 0.0–0.1)
Basophils Relative: 0 %
EOS PCT: 1 %
Eosinophils Absolute: 0.1 10*3/uL (ref 0.0–0.7)
HEMATOCRIT: 39.8 % (ref 39.0–52.0)
Hemoglobin: 12.8 g/dL — ABNORMAL LOW (ref 13.0–17.0)
LYMPHS ABS: 1.9 10*3/uL (ref 0.7–4.0)
LYMPHS PCT: 19 %
MCH: 30.3 pg (ref 26.0–34.0)
MCHC: 32.2 g/dL (ref 30.0–36.0)
MCV: 94.1 fL (ref 78.0–100.0)
MONO ABS: 0.7 10*3/uL (ref 0.1–1.0)
Monocytes Relative: 7 %
NEUTROS ABS: 7.3 10*3/uL (ref 1.7–7.7)
Neutrophils Relative %: 73 %
PLATELETS: 193 10*3/uL (ref 150–400)
RBC: 4.23 MIL/uL (ref 4.22–5.81)
RDW: 16.7 % — ABNORMAL HIGH (ref 11.5–15.5)
WBC: 10 10*3/uL (ref 4.0–10.5)

## 2015-11-19 LAB — BASIC METABOLIC PANEL
ANION GAP: 8 (ref 5–15)
BUN: 32 mg/dL — AB (ref 6–20)
CO2: 29 mmol/L (ref 22–32)
Calcium: 9.6 mg/dL (ref 8.9–10.3)
Chloride: 107 mmol/L (ref 101–111)
Creatinine, Ser: 0.74 mg/dL (ref 0.61–1.24)
GFR calc Af Amer: >60 mL/min (ref >60)
GFR calc non Af Amer: >60 mL/min (ref >60)
GLUCOSE: 99 mg/dL (ref 65–99)
POTASSIUM: 3.9 mmol/L (ref 3.5–5.1)
Sodium: 144 mmol/L (ref 135–145)

## 2015-11-19 LAB — URINALYSIS, ROUTINE W REFLEX MICROSCOPIC
BILIRUBIN URINE: NEGATIVE
Glucose, UA: 250 mg/dL — AB
KETONES UR: NEGATIVE mg/dL
LEUKOCYTES UA: NEGATIVE
NITRITE: NEGATIVE
Protein, ur: 30 mg/dL — AB
SPECIFIC GRAVITY, URINE: 1.028 (ref 1.005–1.030)
pH: 6 (ref 5.0–8.0)

## 2015-11-19 LAB — URINE MICROSCOPIC-ADD ON: Squamous Epithelial / LPF: NONE SEEN

## 2015-11-19 NOTE — ED Notes (Signed)
Discharge instructions read to [facility], NP at  [facility] [facility].

## 2015-11-19 NOTE — ED Notes (Signed)
Patient tolerating water with out difficulty

## 2015-11-19 NOTE — Discharge Instructions (Signed)
Take your usual prescriptions as previously directed.  Increase your fluid intake (ie:  Gatoraide) for the next few days.  Eat a bland diet and advance to your regular diet slowly as you can tolerate it. Keep the foley catheter in place for the next 3 days.  Call your regular medical doctor tomorrow to schedule a follow up appointment in the next 2 days. Call the Urologist tomorrow to schedule a follow up appointment in the next 3 days for a recheck.  Return to the Emergency Department immediately sooner if worsening.

## 2015-11-19 NOTE — ED Notes (Signed)
Discharge instructions given to the [facility] at the bedside.

## 2015-11-19 NOTE — ED Notes (Signed)
Patient left via [place] x 2 officers. Leg Bag in place. Patient left with bilateral leg and hand cuffs.

## 2015-11-19 NOTE — ED Notes (Signed)
Deputy in with patient states they are waiting for another officer to transport patient back to jail.

## 2015-11-19 NOTE — ED Notes (Signed)
Bed: [id number] Expected date:  Expected time:  Means of arrival:  Comments: EMS- 70 yo dehydration, cough x1 week

## 2015-11-19 NOTE — ED Notes (Signed)
Attempted to In and out cath patient. Cath inserted easily, but no urine returned. Writer began removing and noted that the catheter was looped and could feel that it was when attempting to remove. Writer unable to remove the catheter. Dr. Wilson Singer in the room and attempted to remove, but unable to do. Catheter left in place.

## 2015-11-19 NOTE — ED Notes (Signed)
Per EMS- Patient is from [facility] and was accompanied by [employer]. Patient arrived with handcuffs on. EMS reported that the nurse at the jail called for patient /dehydration. EMS reported that the patient was drinking water when they arrived and VS stable. Patient had no other complaints.

## 2015-11-19 NOTE — ED Provider Notes (Signed)
CSN: [id number]     Arrival date & time 11/19/15  1009 History   First MD Initiated Contact with Patient 11/19/15 1016     Chief Complaint  Patient presents with  . Dehydration      HPI Pt was seen at 1050. Per EMS: Pt sent to the ED by RN at [facility] jail for "dehydration." EMS states pt was drinking water on their arrival to scene, VSS en route. Pt only c/o chronic hip pain on arrival to the ED. Denies any other complaints.    Past Medical History  Diagnosis Date  . Depression   . Fall at nursing home     December 2014  . History of migraine   . Gout attack   . Complete heart block ([place])     St. Jude pacemaker - followed by Dr. Lovena Le  . Chronic atrial fibrillation ([place])   . HLD (hyperlipidemia)   . GIB (gastrointestinal bleeding)   . BPH (benign prostatic hyperplasia)   . Essential hypertension   . Gastric ulcer   . Non Hodgkin's lymphoma ([place])   . Tubular adenoma of colon   . Aspiration pneumonia ([place]) 10/07/2014  . 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 Atypical angina ([place]) 12/01/2014  . BLOCK, AV, COMPLETE 12/24/2006    Annotation: Pacer placed Qualifier: Diagnosis of  By: Hilma Favors  DO, Beth    . 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 Fracture femur, cervicotrochanteric ([place]) 05/27/2013  . Chronic chest pain   . Malingering    Past Surgical History  Procedure Laterality Date  . Pacemaker generator change      Dr. Lovena Le  . Appendectomy    . Cholecystectomy    . Open reduction of hip Right 05/28/2013    Procedure: OPEN REDUCTION OF HIP;  Surgeon: Renette Butters, MD;  Location: [place];  Service: Orthopedics;  Laterality: Right;  . Esophagogastroduodenoscopy (egd) with propofol N/A 09/26/2014    Procedure: ESOPHAGOGASTRODUODENOSCOPY (EGD) WITH PROPOFOL;  Surgeon: Jerene Bears, MD;  Location: [facility] ENDOSCOPY;  Service: Endoscopy;   Laterality: N/A;  . Colonoscopy N/A 10/01/2014    Procedure: COLONOSCOPY;  Surgeon: Jerene Bears, MD;  Location[facility] END[facility]COPY;  Service: Endoscopy;  Laterality: N/A;  . Partial colectomy N/A 10/05/2014    Procedure: SUBTOTAL COLECTOMY WITH ILEORECTAL ANASTOMOSIS;  Surgeon: Donnie Mesa, MD;  Location: [place];  Service: General;  Laterality: N/A;  . Flexible sigmoidoscopy N/A 11/07/2014    Procedure: FLEXIBLE SIGMOIDOSCOPY;  Surgeon: Jerene Bears, MD;  Location: [facility] ENDOSCOPY;  Service: Endoscopy;  Laterality: N/A;  . Gastric resection    . Flexible sigmoidoscopy N/A 07/30/2015    Procedure: FLEXIBLE SIGMOIDOSCOPY;  Surgeon: Doran Stabler, MD;  Location: [facility] ENDOSCOPY;  Service: Endoscopy;  Laterality: N/A;  . Transanal excision of rectal mass N/A 08/03/2015    Procedure: TRANSANAL MINIMALLY INVASIVE SURGERY TO RESECT RECTAL POLYP;  Surgeon: [name], MD;  Location: [facility] ORS;  Service: General;  Laterality: N/A;  . Flexible sigmoidoscopy N/A 08/03/2015    Procedure: FLEXIBLE SIGMOIDOSCOPY;  Surgeon: [name], MD;  Location: [facility]S;  Service: General;  Laterality: N/A;   Family History  Problem Relation Age of Onset  . Colon cancer Mother    Social History  Substance Use Topics  . Smoking status: Current Every Day Smoker --  0.50 packs/day for 66 years    Types: Cigarettes  . Smokeless tobacco: Former Systems developer    Types: Snuff, Chew     Comment: 05/27/2013 "haven't used chew or snuff in ~ 30 yr"  . Alcohol Use: 0.0 oz/week    0 Standard drinks or equivalent per week     Comment: occasionally    Review of Systems ROS: Statement: All systems negative except as marked or noted in the HPI; Constitutional: Negative for fever and chills. ; ; Eyes: Negative for eye pain, redness and discharge. ; ; ENMT: Negative for ear pain, hoarseness, nasal congestion, sinus pressure and sore throat. ; ; Cardiovascular: Negative for chest pain, palpitations, diaphoresis, dyspnea and peripheral edema. ; ;  Respiratory: Negative for cough, wheezing and stridor. ; ; Gastrointestinal: Negative for nausea, vomiting, diarrhea, abdominal pain, blood in stool, hematemesis, jaundice and rectal bleeding. . ; ; Genitourinary: Negative for dysuria, flank pain and hematuria. ; ; Musculoskeletal: +chronic hip pain. Negative for back pain and neck pain. Negative for swelling and trauma.; ; Skin: Negative for pruritus, rash, abrasions, blisters, bruising and skin lesion.; ; Neuro: Negative for headache, lightheadedness and neck stiffness. Negative for weakness, altered level of consciousness, altered mental status, extremity weakness, paresthesias, involuntary movement, seizure and syncope.      Allergies  Review of patient's allergies indicates no known allergies.  Home Medications   Prior to Admission medications   Medication Sig Start Date End Date Taking? Authorizing Provider  albuterol (PROVENTIL HFA;VENTOLIN HFA) 108 (90 Base) MCG/ACT inhaler Inhale 2 puffs into the lungs every 4 (four) hours as needed for wheezing or shortness of breath. 08/07/15   Venetia Maxon Rama, MD  aspirin EC 81 MG tablet Take 81 mg by mouth daily.    Historical Provider, MD  carvedilol (COREG) 6.25 MG tablet Take 1 tablet (6.25 mg total) by mouth 2 (two) times daily with a meal. 09/23/15   Blanchie Dessert, MD  diazepam (VALIUM) 5 MG tablet Take 5 mg by mouth every 12 (twelve) hours as needed for anxiety.    Historical Provider, MD  diphenoxylate-atropine (LOMOTIL) 2.5-0.025 MG tablet Take 1 tablet by mouth 4 (four) times daily as needed for diarrhea or loose stools. 09/25/15   Tanna Furry, MD  feeding supplement, ENSURE ENLIVE, (ENSURE ENLIVE) LIQD Take 237 mLs by mouth 2 (two) times daily between meals. 08/07/15   Venetia Maxon Rama, MD  omeprazole (PRILOSEC) 40 MG capsule Take 40 mg by mouth daily.    Historical Provider, MD  oxyCODONE-acetaminophen (PERCOCET/ROXICET) 5-325 MG tablet Take 1 tablet by mouth every 6 (six) hours as needed for  severe pain.    Historical Provider, MD  predniSONE (DELTASONE) 20 MG tablet Take 3 tablets (60 mg total) by mouth daily. 10/27/15   Kristen N Ward, DO   BP 122/86 mmHg  Pulse 88  Temp(Src) 97.6 F (36.4 C) (Oral)  Resp 20  SpO2 99%   12:43 Orthostatic Vital Signs TD  Orthostatic Lying  - BP- Lying: 124/65 mmHg ; Pulse- Lying: 83  Orthostatic Sitting - BP- Sitting: 112/69 mmHg ; Pulse- Sitting: 80  Orthostatic Standing at 0 minutes - BP- Standing at 0 minutes:  (Pt unable to stand)       Physical Exam  1020: Physical examination:  Nursing notes reviewed; Vital signs and O2 SAT reviewed;  Constitutional: Thin, frail. Well hydrated, In no acute distress; Head:  Normocephalic, atraumatic; Eyes: EOMI, PERRL, No scleral icterus; ENMT: Mouth and pharynx normal, Mucous membranes moist; Neck:  Supple, Full range of motion, No lymphadenopathy; Cardiovascular: Regular rate and rhythm, No gallop; Respiratory: Breath sounds clear & equal bilaterally, No wheezes.  Speaking full sentences with ease, Normal respiratory effort/excursion; Chest: Nontender, Movement normal; Abdomen: Soft, Nontender, Nondistended, Normal bowel sounds; Genitourinary: No CVA tenderness; Extremities: Pulses normal, No deformity, No edema, No calf edema or asymmetry.; Neuro: AA&Ox3, No facial droop. Speech baseline. Moves all extremities spontaneously without apparent gross focal motor deficits.; Skin: Color normal, Warm, Dry.   ED Course  Procedures (including critical care time) Labs Review  Imaging Review  I have personally reviewed and evaluated these images and lab results as part of my medical decision-making.   EKG Interpretation None      MDM  MDM Reviewed: previous chart, nursing note and vitals Reviewed previous: labs Interpretation: labs   Results for orders placed or performed during the hospital encounter of 11/19/15  Urinalysis, Routine w reflex microscopic  Result Value Ref Range   Color, Urine  YELLOW YELLOW   APPearance CLEAR CLEAR   Specific Gravity, Urine 1.028 1.005 - 1.030   pH 6.0 5.0 - 8.0   Glucose, UA 250 (A) NEGATIVE mg/dL   Hgb urine dipstick MODERATE (A) NEGATIVE   Bilirubin Urine NEGATIVE NEGATIVE   Ketones, ur NEGATIVE NEGATIVE mg/dL   Protein, ur 30 (A) NEGATIVE mg/dL   Nitrite NEGATIVE NEGATIVE   Leukocytes, UA NEGATIVE NEGATIVE  CBC with Differential  Result Value Ref Range   WBC 10.0 4.0 - 10.5 K/uL   RBC 4.23 4.22 - 5.81 MIL/uL   Hemoglobin 12.8 (L) 13.0 - 17.0 g/dL   HCT 39.8 39.0 - 52.0 %   MCV 94.1 78.0 - 100.0 fL   MCH 30.3 26.0 - 34.0 pg   MCHC 32.2 30.0 - 36.0 g/dL   RDW 16.7 (H) 11.5 - 15.5 %   Platelets 193 150 - 400 K/uL   Neutrophils Relative % 73 %   Neutro Abs 7.3 1.7 - 7.7 K/uL   Lymphocytes Relative 19 %   Lymphs Abs 1.9 0.7 - 4.0 K/uL   Monocytes Relative 7 %   Monocytes Absolute 0.7 0.1 - 1.0 K/uL   Eosinophils Relative 1 %   Eosinophils Absolute 0.1 0.0 - 0.7 K/uL   Basophils Relative 0 %   Basophils Absolute 0.0 0.0 - 0.1 K/uL  Basic metabolic panel  Result Value Ref Range   Sodium 144 135 - 145 mmol/L   Potassium 3.9 3.5 - 5.1 mmol/L   Chloride 107 101 - 111 mmol/L   CO2 29 22 - 32 mmol/L   Glucose, Bld 99 65 - 99 mg/dL   BUN 32 (H) 6 - 20 mg/dL   Creatinine, Ser 0.74 0.61 - 1.24 mg/dL   Calcium 9.6 8.9 - 10.3 mg/dL   GFR calc non Af Amer >60 >60 mL/min   GFR calc Af Amer >60 >60 mL/min   Anion gap 8 5 - 15  Urine microscopic-add on  Result Value Ref Range   Squamous Epithelial / LPF NONE SEEN NONE SEEN   WBC, UA 0-5 0 - 5 WBC/hpf   RBC / HPF 6-30 0 - 5 RBC/hpf   Bacteria, UA RARE (A) NONE SEEN   Urine-Other MUCOUS PRESENT       1105:  ED RN I&O cath for urine: cath easily inserted but unable to remove (felt like looped, per RN).  T/C to Uro Dr. Alyson Ingles, case discussed, including:  HPI, pertinent PM/SHx, VS/PE, dx testing, ED course and treatment:  Agreeable to come to ED for evaluation.  1200:  Uro Dr. Alyson Ingles  has evaluated pt in the ED: he has removed I&O cath and replaced with foley cath, states foley will need to stay in place x3 days. [facility] staff state pt cannot go back to jail with foley. Will contact SW/CM for assistance.   1245:  Pt refuses to stand for VS. Not orthostatic.   1315:  SW called jail: pt is able to go back to jail with leg bag.  VS remain stable, resps easy, abd benign. Pt tol PO. Dx and testing d/w pt.  Questions answered.  Verb understanding, agreeable to d/c home with outpt f/u.   Francine Graven, DO 11/22/15 2240

## 2015-11-19 NOTE — ED Notes (Signed)
Foley cath changed to leg bag

## 2015-11-19 NOTE — ED Notes (Signed)
Patient c/o bilateral leg pain. Patient states, "I have had them broken a million times."

## 2015-11-19 NOTE — ED Notes (Signed)
Patient placed in a disposable blue gown and pericare given.

## 2015-11-20 ENCOUNTER — Telehealth: Payer: Self-pay | Admitting: *Deleted

## 2015-11-24 NOTE — Consult Note (Signed)
Urology Consult  Referring physician: Dr. Burnard Leigh Reason for referral: difficult foley placement  Chief Complaint: penile pain  History of Present Illness: Mr Ostaszewski is a 70yo with a hx of BPH who presented to the ER with concerns for fatigue and dehydration. Nursing attempted to perform I&O cath for a urine specimen and the catheter became lodged in the urethra. Per patient he has no hx of urethral stricture disease. He has severe, sharp, constant, nonradition penile pain with associated gross bloody drainage from his penis.   Past Medical History  Diagnosis Date  . Depression   . Fall at nursing home     December 2014  . History of migraine   . Gout attack   . Complete heart block ([place])     St. Jude pacemaker - followed by Dr. Lovena Le  . Chronic atrial fibrillation ([place])   . HLD (hyperlipidemia)   . GIB (gastrointestinal bleeding)   . BPH (benign prostatic hyperplasia)   . Essential hypertension   . Gastric ulcer   . Non Hodgkin's lymphoma ([place])   . Tubular adenoma of colon   . Aspiration pneumonia ([place]) 10/07/2014  . 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 Atypical angina ([place]) 12/01/2014  . BLOCK, AV, COMPLETE 12/24/2006    Annotation: Pacer placed Qualifier: Diagnosis of  By: Hilma Favors  DO, Beth    . 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 Fracture femur, cervicotrochanteric ([place]) 05/27/2013  . Chronic chest pain   . Malingering    Past Surgical History  Procedure Laterality Date  . Pacemaker generator change      Dr. Lovena Le  . Appendectomy    . Cholecystectomy    . Open reduction of hip Right 05/28/2013    Procedure: OPEN REDUCTION OF HIP;  Surgeon: Renette Butters, MD;  Location: [place];  Service: Orthopedics;  Laterality: Right;  . Esophagogastroduodenoscopy (egd) with propofol N/A 09/26/2014    Procedure: ESOPHAGOGASTRODUODENOSCOPY  (EGD) WITH PROPOFOL;  Surgeon: Jerene Bears, MD;  Location: [facility] ENDOSCOPY;  Service: Endoscopy;  Laterality: N/A;  . Colonoscopy N/A 10/01/2014    Procedure: COLONOSCOPY;  Surgeon: Jerene Bears, MD;  Location: [facility] ENDOSCOPY;  Service: Endoscopy;  Laterality: N/A;  . Partial colectomy N/A 10/05/2014    Procedure: SUBTOTAL COLECTOMY WITH ILEORECTAL ANASTOMOSIS;  Surgeon: Donnie Mesa, MD;  Location: [place];  Service: General;  Laterality: N/A;  . Flexible sigmoidoscopy N/A 11/07/2014    Procedure: FLEXIBLE SIGMOIDOSCOPY;  Surgeon: Jerene Bears, MD;  Location: [facility] ENDOSCOPY;  Service: Endoscopy;  Laterality: N/A;  . Gastric resection    . Flexible sigmoidoscopy N/A 07/30/2015    Procedure: FLEXIBLE SIGMOIDOSCOPY;  Surgeon: Doran Stabler, MD;  Location: [facility] ENDOSCOPY;  Service: Endoscopy;  Laterality: N/A;  . Transanal excision of rectal mass N/A 08/03/2015    Procedure: TRANSANAL MINIMALLY INVASIVE SURGERY TO RESECT RECTAL POLYP;  Surgeon: [name], MD;  Location: [facility];  Service: General;  Laterality: N/A;  . Flexible sigmoidoscopy N/A 08/03/2015    Procedure: FLEXIBLE SIGMOIDOSCOPY;  Surgeon: [name], MD;  Location: [facility];  Service: General;  Laterality: N/A;    Medications: I have reviewed the patient's current medications. Allergies: No Known Allergies  Family History  Problem Relation Age of Onset  . Colon cancer Mother    Social History:  reports  that he has been smoking Cigarettes.  He has a 33 pack-year smoking history. He has quit using smokeless tobacco. His smokeless tobacco use included Snuff and Chew. He reports that he drinks alcohol. He reports that he uses illicit drugs (Marijuana).  Review of Systems  Genitourinary: Positive for dysuria and hematuria.  All other systems reviewed and are negative.   Physical Exam:  Vital signs in last 24 hours:   Physical Exam  Constitutional: He is oriented to person, place, and time. He appears well-developed and  well-nourished.  HENT:  Head: Normocephalic and atraumatic.  Eyes: EOM are normal. Pupils are equal, round, and reactive to light.  Neck: Normal range of motion. No thyromegaly present.  Cardiovascular: Normal rate and regular rhythm.   Respiratory: Effort normal. No respiratory distress.  GI: Soft. He exhibits no distension. Hernia confirmed negative in the right inguinal area and confirmed negative in the left inguinal area.  Genitourinary: Testes normal. Right testis shows no mass. Left testis shows no mass. Circumcised. Penile tenderness present.  Musculoskeletal: Normal range of motion.  Lymphadenopathy:       Right: No inguinal adenopathy present.       Left: No inguinal adenopathy present.  Neurological: He is alert and oriented to person, place, and time.  Skin: Skin is warm and dry.  Psychiatric: He has a normal mood and affect. His behavior is normal. Judgment and thought content normal.    Laboratory Data:  No results found for this or any previous visit (from the past 72 hour(s)). No results found for this or any previous visit (from the past 240 hour(s)). Creatinine:  Recent Labs  11/19/15 1021  CREATININE 0.74   Baseline Creatinine: 0.7  Foley Catheter Plac[place]et--Complex  Patient was prepped and draped in the usual sterile fashion using betadine solution. 2% viscous lidocaine was inserted per urethra. A 16 Fr council catheter was placed via the Blitz technique and passed easily with immediate return of 400 cc of clear, amber colored urine without clot. 10 cc sterile water was placed in the balloon port.    Impression/Assessment:  69yo with foreign body in urethra, difficult foley placement  Plan:  1. Gentle traction was used to remove the foley catheter. 2. 16 French foley was placed without incident. Foley should remain in place for 5 days and then patient should followup for a voiding trial  Nicolette Bang 11/24/2015, 11:47 AM

## 2015-12-25 ENCOUNTER — Encounter (HOSPITAL_COMMUNITY): Payer: Self-pay | Admitting: Emergency Medicine

## 2015-12-25 ENCOUNTER — Emergency Department (HOSPITAL_COMMUNITY)
Admission: EM | Admit: 2015-12-25 | Discharge: 2015-12-25 | Disposition: A | Discharge status: Home / Self Care | Payer: Medicare Other | Attending: Emergency Medicine | Admitting: Emergency Medicine

## 2015-12-25 DIAGNOSIS — K921 Melena: Secondary | ICD-10-CM | POA: Insufficient documentation

## 2015-12-25 DIAGNOSIS — Z950 Presence of cardiac pacemaker: Secondary | ICD-10-CM | POA: Diagnosis not present

## 2015-12-25 DIAGNOSIS — I10 Essential (primary) hypertension: Secondary | ICD-10-CM | POA: Insufficient documentation

## 2015-12-25 DIAGNOSIS — F1290 Cannabis use, unspecified, uncomplicated: Secondary | ICD-10-CM | POA: Diagnosis not present

## 2015-12-25 DIAGNOSIS — F17210 Nicotine dependence, cigarettes, uncomplicated: Secondary | ICD-10-CM | POA: Diagnosis not present

## 2015-12-25 DIAGNOSIS — Z8679 Personal history of other diseases of the circulatory system: Secondary | ICD-10-CM | POA: Diagnosis not present

## 2015-12-25 DIAGNOSIS — R1084 Generalized abdominal pain: Secondary | ICD-10-CM | POA: Insufficient documentation

## 2015-12-25 DIAGNOSIS — F329 Major depressive disorder, single episode, unspecified: Secondary | ICD-10-CM | POA: Diagnosis not present

## 2015-12-25 DIAGNOSIS — I482 Chronic atrial fibrillation: Secondary | ICD-10-CM | POA: Diagnosis not present

## 2015-12-25 DIAGNOSIS — E785 Hyperlipidemia, unspecified: Secondary | ICD-10-CM | POA: Insufficient documentation

## 2015-12-25 DIAGNOSIS — I429 Cardiomyopathy, unspecified: Secondary | ICD-10-CM | POA: Insufficient documentation

## 2015-12-25 DIAGNOSIS — Z7982 Long term (current) use of aspirin: Secondary | ICD-10-CM | POA: Diagnosis not present

## 2015-12-25 DIAGNOSIS — Z79899 Other long term (current) drug therapy: Secondary | ICD-10-CM | POA: Diagnosis not present

## 2015-12-25 LAB — CBC WITH DIFFERENTIAL/PLATELET
BASOS ABS: 0 10*3/uL (ref 0.0–0.1)
Basophils Relative: 0 %
EOS ABS: 0.1 10*3/uL (ref 0.0–0.7)
EOS PCT: 1 %
HCT: 35.7 % — ABNORMAL LOW (ref 39.0–52.0)
Hemoglobin: 10.5 g/dL — ABNORMAL LOW (ref 13.0–17.0)
LYMPHS PCT: 25 %
Lymphs Abs: 2.2 10*3/uL (ref 0.7–4.0)
MCH: 28.4 pg (ref 26.0–34.0)
MCHC: 29.4 g/dL — ABNORMAL LOW (ref 30.0–36.0)
MCV: 96.5 fL (ref 78.0–100.0)
Monocytes Absolute: 0.7 10*3/uL (ref 0.1–1.0)
Monocytes Relative: 8 %
Neutro Abs: 5.8 10*3/uL (ref 1.7–7.7)
Neutrophils Relative %: 66 %
PLATELETS: 200 10*3/uL (ref 150–400)
RBC: 3.7 MIL/uL — AB (ref 4.22–5.81)
RDW: 15.9 % — ABNORMAL HIGH (ref 11.5–15.5)
WBC: 8.7 10*3/uL (ref 4.0–10.5)

## 2015-12-25 LAB — COMPREHENSIVE METABOLIC PANEL
ALT: 17 U/L (ref 17–63)
AST: 17 U/L (ref 15–41)
Albumin: 3.6 g/dL (ref 3.5–5.0)
Alkaline Phosphatase: 90 U/L (ref 38–126)
Anion gap: 7 (ref 5–15)
BILIRUBIN TOTAL: 0.6 mg/dL (ref 0.3–1.2)
BUN: 26 mg/dL — AB (ref 6–20)
CO2: 26 mmol/L (ref 22–32)
Calcium: 8.8 mg/dL — ABNORMAL LOW (ref 8.9–10.3)
Chloride: 103 mmol/L (ref 101–111)
Creatinine, Ser: 0.7 mg/dL (ref 0.61–1.24)
Glucose, Bld: 115 mg/dL — ABNORMAL HIGH (ref 65–99)
POTASSIUM: 4.1 mmol/L (ref 3.5–5.1)
Sodium: 136 mmol/L (ref 135–145)
TOTAL PROTEIN: 7 g/dL (ref 6.5–8.1)

## 2015-12-25 LAB — POC OCCULT BLOOD, ED: Fecal Occult Bld: NEGATIVE

## 2015-12-25 LAB — LIPASE, BLOOD: LIPASE: 32 U/L (ref 11–51)

## 2015-12-25 NOTE — ED Notes (Addendum)
Pt states portion of colon removed last month due to "black spots". Pt states 2 episodes of bloody stools/daily since colon operation last month. Pt states tenderness in lower stomach. Pt states vomited x 1 yesterday clear in color. No vomiting today.

## 2015-12-25 NOTE — ED Provider Notes (Signed)
CSN: [id number]     Arrival date & time 12/25/15  1258 History   First MD Initiated Contact with Patient 12/25/15 1302     Chief Complaint  Patient presents with  . Abdominal Pain  . Rectal Bleeding    (Consider location/radiation/quality/duration/timing/severity/associated sxs/prior Treatment) Patient is a 70 y.o. male presenting with abdominal pain and hematochezia. The history is provided by the patient and medical records. No language interpreter was used.  Abdominal Pain Associated symptoms: hematochezia   Associated symptoms: no chills, no cough, no dysuria, no fever, no nausea and no shortness of breath   Rectal Bleeding Associated symptoms: abdominal pain   Associated symptoms: no fever      [name] is a 70 y.o. male  with an extensive PMH (see below) including appendectomy, cholecystectomy, partial colectomy (2016) who presents to the Emergency Department from [facility] complaining of unchanged, intermittent generalized abdominal pain 1 month. Patient endorses bright red rectal bleeding over the last month as well. + Nausea, one episode of emesis yesterday-no emesis today. No medications taken for symptoms. Denies chest pain, shortness breath, back pain, constipation, diarrhea, fever.   Past Medical History  Diagnosis Date  . Depression   . Fall at nursing home     December 2014  . History of migraine   . Gout attack   . Complete heart block ([place])     St. Jude pacemaker - followed by Dr. Lovena Le  . Chronic atrial fibrillation ([place])   . HLD (hyperlipidemia)   . GIB (gastrointestinal bleeding)   . BPH (benign prostatic hyperplasia)   . Essential hypertension   . Gastric ulcer   . Non Hodgkin's lymphoma ([place])   . Tubular adenoma of colon   . Aspiration pneumonia ([place]) 10/07/2014  . 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 Atypical angina ([place]) 12/01/2014  . 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 Fracture femur, cervicotrochanteric ([place]) 05/27/2013  . Chronic chest pain   . Malingering    Past Surgical History  Procedure Laterality Date  . Pacemaker generator change      Dr. Lovena Le  . Appendectomy    . Cholecystectomy    . Open reduction of hip Right 05/28/2013    Procedure: OPEN REDUCTION OF HIP;  Surgeon: Renette Butters, MD;  Location: [place];  Service: Orthopedics;  Laterality: Right;  . Esophagogastroduodenoscopy (egd) with propofol N/A 09/26/2014    Procedure: ESOPHAGOGASTRODUODENOSCOPY (EGD) WITH PROPOFOL;  Surgeon: Jerene Bears, MD;  Location[facility] ENDOSCOPY;  Service: Endoscopy;  Laterality: N/A;  . Colonoscopy N/A 10/01/2014    Procedure: COLONOSCOPY;  Surgeon: Jerene Bears, MD;  Location: [facility] ENDOSCOPY;  Service: Endoscopy;  Laterality: N/A;  . Partial colectomy N/A 10/05/2014    Procedure: SUBTOTAL COLECTOMY WITH ILEORECTAL ANASTOMOSIS;  Surgeon: Donnie Mesa, MD;  Location: [place];  Service: General;  Laterality: N/A;  . Flexible sigmoidoscopy N/A 11/07/2014    Procedure: FLEXIBLE SIGMOIDOSCOPY;  Surgeon: Jerene Bears, MD;  Location: [facility] ENDOSCOPY;  Service: Endoscopy;  Laterality: N/A;  . Gastric resection    . Flexible sigmoidoscopy N/A 07/30/2015    Procedure: FLEXIBLE SIGMOIDOSCOPY;  Surgeon: Doran Stabler, MD;  Location: [facility] ENDOSCOPY;  Service: Endoscopy;  Laterality: N/A;  . Transanal excision of rectal mass N/A 08/03/2015    Procedure: TRANSANAL MINIMALLY INVASIVE SURGERY TO RESECT RECTAL POLYP;  Surgeon: [name], MD;  Location: [facility] ORS;  Service: General;  Laterality: N/A;  . Flexible sigmoidoscopy N/A 08/03/2015    Procedure: FLEXIBLE SIGMOIDOSCOPY;  Surgeon: [name], MD;  Location: [facility] ORS;  Service: General;  Laterality: N/A;   Family History   Problem Relation Age of Onset  . Colon cancer Mother    Social History  Substance Use Topics  . Smoking status: Current Every Day Smoker -- 0.50 packs/day for 66 years    Types: Cigarettes  . Smokeless tobacco: Former Systems developer    Types: Snuff, Chew     Comment: 05/27/2013 "haven't used chew or snuff in ~ 30 yr"  . Alcohol Use: 0.0 oz/week    0 Standard drinks or equivalent per week     Comment: occasionally    Review of Systems  Constitutional: Negative for fever and chills.  HENT: Negative for congestion.   Eyes: Negative for visual disturbance.  Respiratory: Negative for cough and shortness of breath.   Cardiovascular: Negative.   Gastrointestinal: Positive for abdominal pain, blood in stool and hematochezia. Negative for nausea.  Genitourinary: Negative for dysuria.  Musculoskeletal: Negative for back pain and neck pain.  Skin: Negative for rash.  Neurological: Negative for headaches.      Allergies  Review of patient's allergies indicates no known allergies.  Home Medications   Prior to Admission medications   Medication Sig Start Date End Date Taking? Authorizing Provider  albuterol (PROVENTIL HFA;VENTOLIN HFA) 108 (90 Base) MCG/ACT inhaler Inhale 2 puffs into the lungs every 4 (four) hours as needed for wheezing or shortness of breath. 08/07/15   Venetia Maxon Rama, MD  albuterol (PROVENTIL) (2.5 MG/3ML) 0.083% nebulizer solution Take 2.5 mg by nebulization every 6 (six) hours as needed for wheezing or shortness of breath.    Historical Provider, MD  alum & mag hydroxide-simeth (MAALOX/MYLANTA) 200-200-20 MG/5ML suspension Take 30 mLs by mouth 3 (three) times daily.    Historical Provider, MD  aspirin EC 81 MG tablet Take 81 mg by mouth daily. Reported on 11/19/2015    Historical Provider, MD  Calcium Carbonate-Vitamin D (CALCIUM-VITAMIN D) 500-200 MG-UNIT tablet Take 1 tablet by mouth 2 (two) times daily.    Historical Provider, MD  carvedilol (COREG) 6.25 MG tablet Take 1  tablet (6.25 mg total) by mouth 2 (two) times daily with a meal. 09/23/15   Blanchie Dessert, MD  diazepam (VALIUM) 5 MG tablet Take 5 mg by mouth every 12 (twelve) hours as needed for anxiety. Reported on 11/19/2015    Historical Provider, MD  diphenoxylate-atropine (LOMOTIL) 2.5-0.025 MG tablet Take 1 tablet by mouth 4 (four) times daily as needed for diarrhea or loose stools. Patient not taking: Reported on 11/19/2015 09/25/15   Tanna Furry, MD  feeding supplement, ENSURE ENLIVE, (ENSURE ENLIVE) LIQD Take 237 mLs by mouth 2 (two) times daily between meals. Patient not taking: Reported on 11/19/2015 08/07/15   Venetia Maxon Rama, MD  finasteride (PROSCAR) 5 MG tablet Take 5 mg by mouth daily.    Historical Provider, MD  magnesium oxide (MAG-OX) 400 MG tablet Take 400 mg by mouth 2 (two) times daily.    Historical Provider, MD  omeprazole (PRILOSEC) 40 MG capsule Take 40 mg by mouth daily. Reported on 11/19/2015    Historical Provider, MD  oxyCODONE-acetaminophen (PERCOCET/ROXICET) 5-325 MG  tablet Take 1 tablet by mouth every 6 (six) hours as needed for severe pain. Reported on 11/19/2015    Historical Provider, MD  pravastatin (PRAVACHOL) 10 MG tablet Take 10 mg by mouth daily.    Historical Provider, MD  predniSONE (DELTASONE) 20 MG tablet Take 3 tablets (60 mg total) by mouth daily. Patient not taking: Reported on 11/19/2015 10/27/15   Delice Bison [name], DO  thiamine 100 MG tablet Take 100 mg by mouth daily.    Historical Provider, MD   BP 122/88 mmHg  Pulse 78  Temp(Src) 98.1 F (36.7 C) (Oral)  Resp 18  Ht 5\' 9"  (1.753 m)  Wt 34.02 kg  BMI 11.07 kg/m2  SpO2 100% Physical Exam  Constitutional: He is oriented to person, place, and time. He appears well-developed. No distress.  HENT:  Head: Normocephalic and atraumatic.  Cardiovascular: Normal rate, regular rhythm and normal heart sounds.  Exam reveals no gallop and no friction rub.   No murmur heard. Pulmonary/Chest: Effort normal and breath sounds  normal. No respiratory distress. He has no wheezes. He has no rales. He exhibits no tenderness.  Abdominal: Soft. Bowel sounds are normal. He exhibits no distension. There is tenderness (Generalized).  Neurological: He is alert and oriented to person, place, and time.  Skin: Skin is warm and dry.  Nursing note and vitals reviewed.   ED Course  Procedures (including critical care time) Labs Review Labs Reviewed  COMPREHENSIVE METABOLIC PANEL - Abnormal; Notable for the following:    Glucose, Bld 115 (*)    BUN 26 (*)    Calcium 8.8 (*)    All other components within normal limits  CBC WITH DIFFERENTIAL/PLATELET - Abnormal; Notable for the following:    RBC 3.70 (*)    Hemoglobin 10.5 (*)    HCT 35.7 (*)    MCHC 29.4 (*)    RDW 15.9 (*)    All other components within normal limits  LIPASE, BLOOD  POC OCCULT BLOOD, ED    Imaging Review No results found. I have personally reviewed and evaluated these images and lab results as part of my medical decision-making.   EKG Interpretation None      MDM   Final diagnoses:  Generalized abdominal pain   [name] presents to ED for unchanged intermittent generalized abdominal pain x 1 month and 1 month bright red rectal bleeding. On exam, non-surgical abdomen. Generalized TTP. Rectal exam performed - hemoccult negative. No blood seen on exam. Lipase wdl. CBC and CMP reviewed and baseline. Evaluation does not show pathology that would require ongoing emergent intervention or inpatient treatment.  Follow up with GI as needed. Patient is hemodynamically stable and mentating appropriately. Discussed findings and plan with patient who agrees with treatment plan as dictated. Return precautions discussed and all questions answered.  Patient discussed with Dr. Alvino Chapel who agrees with treatment plan.   [facility] Pearla Mckinny, PA-C 12/25/15 1602  Davonna Belling, MD 12/26/15 [phone]

## 2015-12-25 NOTE — Discharge Instructions (Signed)
Follow up with your GI (stomach) doctor.  Return to ER for new or worsening symptoms, any additional concerns.

## 2015-12-25 NOTE — ED Notes (Signed)
Pt presents in custody with [place]. Metal cuff to Right wrist. Passive application. No breakdown. Pt tolerating at present. Pt calm and cooperative with care.

## 2015-12-25 NOTE — ED Notes (Addendum)
Patient presents from [facility] for abdominal pain, rectal bleeding and nausea. Patient reports abdominal pain x1 month since "colon surgery". Paperwork reports rectal bleeding. Patient states BRB.

## 2015-12-25 NOTE — ED Notes (Signed)
ED PA at bedside

## 2016-01-01 ENCOUNTER — Emergency Department (HOSPITAL_COMMUNITY)
Admission: EM | Admit: 2016-01-01 | Discharge: 2016-01-02 | Disposition: A | Discharge status: Home / Self Care | Payer: Medicare Other | Attending: Emergency Medicine | Admitting: Emergency Medicine

## 2016-01-01 ENCOUNTER — Emergency Department (HOSPITAL_COMMUNITY): Payer: Medicare Other

## 2016-01-01 ENCOUNTER — Encounter (HOSPITAL_COMMUNITY): Payer: Self-pay | Admitting: Emergency Medicine

## 2016-01-01 DIAGNOSIS — Z7982 Long term (current) use of aspirin: Secondary | ICD-10-CM | POA: Diagnosis not present

## 2016-01-01 DIAGNOSIS — M25552 Pain in left hip: Secondary | ICD-10-CM | POA: Diagnosis present

## 2016-01-01 DIAGNOSIS — I10 Essential (primary) hypertension: Secondary | ICD-10-CM | POA: Insufficient documentation

## 2016-01-01 DIAGNOSIS — Z8572 Personal history of non-Hodgkin lymphomas: Secondary | ICD-10-CM | POA: Diagnosis not present

## 2016-01-01 DIAGNOSIS — F17210 Nicotine dependence, cigarettes, uncomplicated: Secondary | ICD-10-CM | POA: Insufficient documentation

## 2016-01-01 DIAGNOSIS — F1290 Cannabis use, unspecified, uncomplicated: Secondary | ICD-10-CM | POA: Diagnosis not present

## 2016-01-01 DIAGNOSIS — Z79899 Other long term (current) drug therapy: Secondary | ICD-10-CM | POA: Insufficient documentation

## 2016-01-01 DIAGNOSIS — J441 Chronic obstructive pulmonary disease with (acute) exacerbation: Secondary | ICD-10-CM | POA: Insufficient documentation

## 2016-01-01 MED ORDER — ACETAMINOPHEN 325 MG PO TABS
650.0000 mg | ORAL_TABLET | Freq: Once | ORAL | Status: AC
  Administered 2016-01-02: 650 mg via ORAL
  Filled 2016-01-01: qty 2

## 2016-01-01 NOTE — ED Notes (Signed)
Pt here with public defender named Tanna Savoy. Gerald Stabs is concerned that Pt can no longer make decisions for himself as he has had multiple episodes where his "mental faculties could be questioned." Gerald Stabs is concerned he needs more help than he has been provided in the past and would like pt to have a social work consult. Pt would also like to be called when pt gets a room so he can speak with a provider about his concerns. This RN spoke with pt and pt validates that Gerald Stabs can be informed about pt care and results.

## 2016-01-01 NOTE — ED Triage Notes (Signed)
PER ems PATIENT outside jail c/o right and left hip pain.  Vitals 132/74, 76Hr, 16r, 95%, CBG 146

## 2016-01-01 NOTE — Discharge Instructions (Signed)
Take tylenol or ibuprofen for pain, as needed. Follow up with a primary care provider. Refer to the resource guide attached for any additional concerns.

## 2016-01-01 NOTE — ED Provider Notes (Addendum)
Medical screening examination/treatment/procedure(s) were conducted as a shared visit with non-physician practitioner(s) and myself.  I personally evaluated the patient during the encounter.  Reported fall with right sided hip pain. Has h/o same. Falls and hip pain. Exam benign aside from wiping feces all over the place. No significant pain with ROM, ecchymosis, deformity. Strong DP pulse. Plan for XR and disposition accordingly. May need social work consult.    Merrily Pew, MD 01/02/16 [place], MD 01/03/16 [id number]

## 2016-01-02 ENCOUNTER — Emergency Department (HOSPITAL_COMMUNITY)
Admission: EM | Admit: 2016-01-02 | Discharge: 2016-01-02 | Disposition: A | Discharge status: Home / Self Care | Payer: Medicare Other | Source: Home / Self Care | Attending: Emergency Medicine | Admitting: Emergency Medicine

## 2016-01-02 ENCOUNTER — Emergency Department (HOSPITAL_COMMUNITY)
Admission: EM | Admit: 2016-01-02 | Discharge: 2016-01-02 | Disposition: A | Discharge status: Home / Self Care | Payer: Medicare Other | Attending: Emergency Medicine | Admitting: Emergency Medicine

## 2016-01-02 ENCOUNTER — Encounter (HOSPITAL_COMMUNITY): Payer: Self-pay

## 2016-01-02 DIAGNOSIS — Z950 Presence of cardiac pacemaker: Secondary | ICD-10-CM

## 2016-01-02 DIAGNOSIS — J449 Chronic obstructive pulmonary disease, unspecified: Secondary | ICD-10-CM | POA: Diagnosis not present

## 2016-01-02 DIAGNOSIS — I428 Other cardiomyopathies: Secondary | ICD-10-CM | POA: Insufficient documentation

## 2016-01-02 DIAGNOSIS — Z7982 Long term (current) use of aspirin: Secondary | ICD-10-CM

## 2016-01-02 DIAGNOSIS — Z79899 Other long term (current) drug therapy: Secondary | ICD-10-CM | POA: Insufficient documentation

## 2016-01-02 DIAGNOSIS — R0789 Other chest pain: Secondary | ICD-10-CM

## 2016-01-02 DIAGNOSIS — F17210 Nicotine dependence, cigarettes, uncomplicated: Secondary | ICD-10-CM | POA: Insufficient documentation

## 2016-01-02 DIAGNOSIS — I259 Chronic ischemic heart disease, unspecified: Secondary | ICD-10-CM | POA: Diagnosis not present

## 2016-01-02 DIAGNOSIS — R079 Chest pain, unspecified: Secondary | ICD-10-CM

## 2016-01-02 DIAGNOSIS — M25552 Pain in left hip: Secondary | ICD-10-CM | POA: Diagnosis present

## 2016-01-02 DIAGNOSIS — J441 Chronic obstructive pulmonary disease with (acute) exacerbation: Secondary | ICD-10-CM | POA: Insufficient documentation

## 2016-01-02 DIAGNOSIS — I10 Essential (primary) hypertension: Secondary | ICD-10-CM

## 2016-01-02 MED ORDER — ALBUTEROL SULFATE HFA 108 (90 BASE) MCG/ACT IN AERS
2.0000 | INHALATION_SPRAY | Freq: Once | RESPIRATORY_TRACT | Status: AC
  Administered 2016-01-02: 2 via RESPIRATORY_TRACT
  Filled 2016-01-02: qty 6.7

## 2016-01-02 NOTE — ED Provider Notes (Signed)
[facility] DEPT Provider Note   CSN: [id number] Arrival date & time: 01/02/16  [id number]  First Provider Contact:  First MD Initiated Contact with Patient 01/02/16 [phone]        History   Chief Complaint Chief Complaint  Patient presents with  . Chest Pain    HPI [name] is a 70 y.o. male.  HPI Patient presents emergency department with complaints of transient chest pain.  Is no longer having chest pain.  He denies shortness of breath.  He denies exertional chest pain.  He does have a history of complete AV block and has a pacemaker.  He denies palpitations.  No fevers or chills.  No productive cough.   Past Medical History:  Diagnosis Date  . Aspiration pneumonia ([place]) 10/07/2014  . ATHEROSCLEROSIS, AORTIC 12/24/2006   Qualifier: Diagnosis of  By: Hilma Favors  DO, Beth    . Atypical angina ([place]) 12/01/2014  . 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 BPH (benign prostatic hyperplasia)   . 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 Chronic atrial fibrillation ([place])   . Chronic chest pain   . Complete heart block ([place])    St. Jude pacemaker - followed by Dr. Lovena Le  . Depression   . Essential hypertension   . Fall at nursing home    December 2014  . Fracture femur, cervicotrochanteric ([place]) 05/27/2013  . Gastric ulcer   . GIB (gastrointestinal bleeding)   . Gout attack   . History of migraine   . HLD (hyperlipidemia)   . Malingering   . Non Hodgkin's lymphoma ([place])   . Tubular adenoma of colon     Patient Active Problem List   Diagnosis Date Noted  . Ileus, postoperative 08/05/2015  . Chest pain 08/05/2015  . COPD exacerbation ([place]) 08/04/2015  . Homelessness 08/04/2015  . Dysplastic rectal polyp   . Bleeding rectal polyp s/p partial proctectomy 08/03/2015   . Chronic abdominal pain   . 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 Pressure ulcer 11/16/2014  . Hx of colectomy   . History of adenomatous polyp of colon   . Rectal mass   . Essential hypertension   . Rectal bleeding   . Acute blood loss anemia   . Delirium   . Protein-calorie malnutrition, severe ([place]) 10/08/2014  . Hematochezia   . Sigmoid & cecal bleeding colon masses s/p abdominal colectomy 10/05/2014   . HLD (hyperlipidemia) 09/30/2014  . Depression 09/30/2014  . Abdominal pain 09/30/2014  . BPH (benign prostatic hyperplasia) 09/29/2014  . Thrombocytopenia ([place]) 12/24/2006  . DEPRESSION, MAJOR, MODERATE 12/24/2006  . LEARNING DISABILITY 12/24/2006  . 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 CARDIOMYOPATHY 12/24/2006  . BLOCK, AV, COMPLETE 12/24/2006  . ATHEROSCLEROSIS, AORTIC 12/24/2006  . Atrial fibrillation ([place]) 12/24/2006  . History of other specified conditions presenting hazards to health 12/24/2006  . CHOLECYSTECTOMY, HX OF 12/24/2006    Past Surgical History:  Procedure Laterality Date  . APPENDECTOMY    . CHOLECYSTECTOMY    . COLONOSCOPY N/A 10/01/2014   Procedure: COLONOSCOPY;  Surgeon: Jerene Bears, MD;  Location: [facility] ENDOSCOPY;  Service: Endoscopy;  Laterality: N/A;  . ESOPHAGOGASTRODUODENOSCOPY (EGD) WITH PROPOFOL N/A 09/26/2014   Procedure: ESOPHAGOGASTRODUODENOSCOPY (EGD) WITH PROPOFOL;  Surgeon: Jerene Bears, MD;  Location: [place];  Service: Endoscopy;  Laterality: N/A;  . FLEXIBLE SIGMOIDOSCOPY N/A 11/07/2014   Procedure: FLEXIBLE SIGMOIDOSCOPY;  Surgeon: Jerene Bears, MD;  Location: [facility] ENDOSCOPY;  Service: Endoscopy;  Laterality: N/A;  . FLEXIBLE SIGMOIDOSCOPY N/A 07/30/2015   Procedure: FLEXIBLE SIGMOIDOSCOPY;  Surgeon: Doran Stabler, MD;  Location: [facility] ENDOSCOPY;  Service: Endoscopy;  Laterality: N/A;  . FLEXIBLE SIGMOIDOSCOPY N/A 08/03/2015   Procedure: FLEXIBLE SIGMOIDOSCOPY;  Surgeon: [name], MD;  Location: [facility] ORS;  Service: General;  Laterality: N/A;  . GASTRIC  RESECTION    . OPEN REDUCTION OF HIP Right 05/28/2013   Procedure: OPEN REDUCTION OF HIP;  Surgeon: Renette Butters, MD;  Location: [place];  Service: Orthopedics;  Laterality: Right;  . PACEMAKER GENERATOR CHANGE     Dr. Lovena Le  . PARTIAL COLECTOMY N/A 10/05/2014   Procedure: SUBTOTAL COLECTOMY WITH ILEORECTAL ANASTOMOSIS;  Surgeon: Donnie Mesa, MD;  Location: [place];  Service: General;  Laterality: N/A;  . TRANSANAL EXCISION OF RECTAL MASS N/A 08/03/2015   Procedure: TRANSANAL MINIMALLY INVASIVE SURGERY TO RESECT RECTAL POLYP;  Surgeon: [name], MD;  Location: [facility] ORS;  Service: General;  Laterality: N/A;       Home Medications    Prior to Admission medications   Medication Sig Start Date End Date Taking? Authorizing Provider  albuterol (PROVENTIL HFA;VENTOLIN HFA) 108 (90 Base) MCG/ACT inhaler Inhale 2 puffs into the lungs every 4 (four) hours as needed for wheezing or shortness of breath. 08/07/15   Venetia Maxon Rama, MD  albuterol (PROVENTIL) (2.5 MG/3ML) 0.083% nebulizer solution Take 2.5 mg by nebulization every 6 (six) hours as needed for wheezing or shortness of breath.    Historical Provider, MD  aspirin EC 81 MG tablet Take 81 mg by mouth daily. Reported on 11/19/2015    Historical Provider, MD  carvedilol (COREG) 6.25 MG tablet Take 1 tablet (6.25 mg total) by mouth 2 (two) times daily with a meal. Patient not taking: Reported on 01/01/2016 09/23/15   Blanchie Dessert, MD  diphenoxylate-atropine (LOMOTIL) 2.5-0.025 MG tablet Take 1 tablet by mouth 4 (four) times daily as needed for diarrhea or loose stools. Patient not taking: Reported on 11/19/2015 09/25/15   Tanna Furry, MD  feeding supplement, ENSURE ENLIVE, (ENSURE ENLIVE) LIQD Take 237 mLs by mouth 2 (two) times daily between meals. Patient not taking: Reported on 11/19/2015 08/07/15   Venetia Maxon Rama, MD  oxyCODONE-acetaminophen (PERCOCET/ROXICET) 5-325 MG tablet Take 1 tablet by mouth every 6 (six) hours as needed for severe  pain. Reported on 11/19/2015    Historical Provider, MD  predniSONE (DELTASONE) 20 MG tablet Take 3 tablets (60 mg total) by mouth daily. Patient not taking: Reported on 11/19/2015 10/27/15   Delice Bison [place], DO    Family History Family History  Problem Relation Age of Onset  . Colon cancer Mother     Social History Social History  Substance Use Topics  . Smoking status: Current Every Day Smoker    Packs/day: 0.50    Years: 66.00    Types: Cigarettes  . Smokeless tobacco: Former Systems developer    Types: Snuff, Chew     Comment: 05/27/2013 "haven't used chew or snuff in ~ 30 yr"  . Alcohol use 0.0 oz/week     Comment: occasionally     Allergies   Review of patient's allergies indicates no known allergies.   Review of Systems Review of Systems  All other systems reviewed and are negative.    Physical Exam Updated Vital Signs  There were no vitals taken for this visit.  Physical Exam  Constitutional: He is oriented to person, place, and time. He appears well-developed and well-nourished.  HENT:  Head: Normocephalic and atraumatic.  Eyes: EOM are normal.  Neck: Normal range of motion.  Cardiovascular: Normal rate, regular rhythm, normal heart sounds and intact distal pulses.   Pulmonary/Chest: Effort normal and breath sounds normal. No respiratory distress.  Abdominal: Soft. He exhibits no distension. There is no tenderness.  Musculoskeletal: Normal range of motion.  Neurological: He is alert and oriented to person, place, and time.  Skin: Skin is warm and dry.  Psychiatric: He has a normal mood and affect. Judgment normal.  Nursing note and vitals reviewed.    ED Treatments / Results  Labs (all labs ordered are listed, but only abnormal results are displayed) Labs Reviewed - No data to display  EKG  EKG Interpretation  Date/Time:  Tuesday January 02 2016 08:32:19 EDT Ventricular Rate:  89 PR Interval:    QRS Duration: 104 QT Interval:  359 QTC Calculation: 437 R  Axis:   159 Text Interpretation:  Atrial fibrillation Ventricular premature complex Probable right ventricular hypertrophy Anterolateral infarct, age indeterminate No significant change was found Confirmed by Elidia Bonenfant  MD, Lennette Bihari ([id number]) on 01/02/2016 8:54:08 AM       Radiology Dg Hip Unilat With Pelvis 2-3 Views Left  Result Date: 01/01/2016 CLINICAL DATA:  Left hip pain following fall today, initial encounter EXAM: DG HIP (WITH OR WITHOUT PELVIS) 2-3V LEFT COMPARISON:  11/10/2015 FINDINGS: Postoperative changes are again noted in the proximal right femur. The pelvic ring is intact. Colonic postsurgical changes are noted as well. Diffuse vascular calcifications are seen. No acute fracture or dislocation is noted. Mild degenerative change of the left hip joint is noted. IMPRESSION: Mild degenerative change without acute abnormality. Electronically Signed   By: Inez Catalina M.D.   On: 01/01/2016 22:36   Procedures Procedures (including critical care time)  Medications Ordered in ED Medications - No data to display   Initial Impression / Assessment and Plan / ED Course  I have reviewed the triage vital signs and the nursing notes.  Pertinent labs & imaging results that were available during my care of the patient were reviewed by me and considered in my medical decision making (see chart for details).  Clinical Course    Atypical chest pain.  EKG with A. fib which is consistent with his baseline.  No indication for additional workup at this time.  This patient is well known to the emergency department with 67 visits in the past 6 months.  He was also noted to be throwing stool around the room.  Final Clinical Impressions(s) / ED Diagnoses   Final diagnoses:  Chest pain, unspecified chest pain type    New Prescriptions New Prescriptions   No medications on file     Jola Schmidt, MD 01/02/16 [phone]

## 2016-01-02 NOTE — ED Triage Notes (Signed)
Pt to be escorted off property per Dr Venora Maples; pt already had medical screening this am

## 2016-01-02 NOTE — ED Triage Notes (Signed)
Pt arrives EMS with c/o chest pain since discharge from [facility] yesterday.

## 2016-01-02 NOTE — ED Notes (Signed)
Cleaned and showered and ADL's for pt

## 2016-01-02 NOTE — ED Notes (Addendum)
Pr smearing stool on bed and chair. Pt questikoned as to why and states he wants to. Dr. Darlyn Chamber.

## 2016-01-02 NOTE — ED Notes (Addendum)
IV d/c'd and pt walks out after hearing from MD that he would be discharged.

## 2016-01-02 NOTE — ED Notes (Signed)
MD at bedside. 

## 2016-01-03 NOTE — ED Provider Notes (Signed)
CSN: [id number] Arrival date & time: 01/01/16  1726  First Provider Contact:  9:45 PM   By signing my name below, I, [name], attest that this documentation has been prepared under the direction and in the presence of [employer], PA-C.  Electronically Signed: [name], ED Scribe. 01/01/16. 9:57 PM.  History                        Chief Complaint    Chief Complaint  Patient presents with  . Hip Pain   LEVEL 5 CAVEAT: HPI and ROS limited due to age  HPI  HPI Comments: [name] is a 70 y.o. male BIB EMS, with a PMHx significant of gout, depression, HTN, and HLD, who presents to the Emergency Department complaining of gradual onset, gradually worsening, constant left sided hip pain that radiates down to his left knee onset PTA. Pt reports that is scheduled to have an amputation to his left knee. He reports that was recently let out of jail this morning after being in there for 6 months. No noted trauma or injury. Denies any other symptoms. Pt is a poor historian.       Past Medical History:  Diagnosis Date  . Aspiration pneumonia ([place]) 10/07/2014  . ATHEROSCLEROSIS, AORTIC 12/24/2006   Qualifier: Diagnosis of  By: Hilma Favors  DO, Beth    . Atypical angina ([place]) 12/01/2014  . 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 BPH (benign prostatic hyperplasia)   . 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 Chronic atrial fibrillation ([place])   . Chronic chest pain   . Complete heart block ([place])    St. Jude pacemaker - followed by Dr. Lovena Le  . Depression   . Essential hypertension   . Fall at nursing home    December 2014  . Fracture femur, cervicotrochanteric ([place]) 05/27/2013  . Gastric ulcer   . GIB (gastrointestinal bleeding)   . Gout attack   . History of migraine   . HLD (hyperlipidemia)    . Malingering   . Non Hodgkin's lymphoma ([place])   . Tubular adenoma of colon         Patient Active Problem List   Diagnosis Date Noted  . Ileus, postoperative 08/05/2015  . Chest pain 08/05/2015  . COPD exacerbation ([place]) 08/04/2015  . Homelessness 08/04/2015  . Dysplastic rectal polyp   . Bleeding rectal polyp s/p partial proctectomy 08/03/2015   . Chronic abdominal pain   . 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 Pressure ulcer 11/16/2014  . Hx of colectomy   . History of adenomatous polyp of colon   . Rectal mass   . Essential hypertension   . Rectal bleeding   . Acute blood loss anemia   . Delirium   . Protein-calorie malnutrition, severe ([place]) 10/08/2014  . Hematochezia   . Sigmoid & cecal bleeding colon masses s/p abdominal colectomy 10/05/2014   . HLD (hyperlipidemia) 09/30/2014  . Depression 09/30/2014  . Abdominal pain 09/30/2014  . BPH (benign prostatic hyperplasia) 09/29/2014  . Thrombocytopenia ([place]) 12/24/2006  . DEPRESSION, MAJOR, MODERATE 12/24/2006  . LEARNING DISABILITY 12/24/2006  . 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 CARDIOMYOPATHY 12/24/2006  . BLOCK, AV, COMPLETE 12/24/2006  . ATHEROSCLEROSIS, AORTIC 12/24/2006  .  Atrial fibrillation ([place]) 12/24/2006  . History of other specified conditions presenting hazards to health 12/24/2006  . CHOLECYSTECTOMY, HX OF 12/24/2006         Past Surgical History:  Procedure Laterality Date  . APPENDECTOMY    . CHOLECYSTECTOMY    . COLONOSCOPY N/A 10/01/2014   Procedure: COLONOSCOPY;  Surgeon: Jerene Bears, MD;  Location: [facility] ENDOSCOPY;  Service: Endoscopy;  Laterality: N/A;  . ESOPHAGOGASTRODUODENOSCOPY (EGD) WITH PROPOFOL N/A 09/26/2014   Procedure: ESOPHAGOGASTRODUODENOSCOPY (EGD) WITH PROPOFOL;  Surgeon: Jerene Bears, MD;  Location: [facility] ENDOSCOPY;  Service: Endoscopy;  Laterality: N/A;  . FLEXIBLE SIGMOIDOSCOPY N/A 11/07/2014   Procedure: FLEXIBLE SIGMOIDOSCOPY;   Surgeon: Jerene Bears, MD;  Location: [facility] ENDOSCOPY;  Service: Endoscopy;  Laterality: N/A;  . FLEXIBLE SIGMOIDOSCOPY N/A 07/30/2015   Procedure: FLEXIBLE SIGMOIDOSCOPY;  Surgeon: Doran Stabler, MD;  Location: [facility] ENDOSCOPY;  Service: Endoscopy;  Laterality: N/A;  . FLEXIBLE SIGMOIDOSCOPY N/A 08/03/2015   Procedure: FLEXIBLE SIGMOIDOSCOPY;  Surgeon: [name], MD;  Location: [facility] ORS;  Service: General;  Laterality: N/A;  . GASTRIC RESECTION    . OPEN REDUCTION OF HIP Right 05/28/2013   Procedure: OPEN REDUCTION OF HIP;  Surgeon: Renette Butters, MD;  Location: [place];  Service: Orthopedics;  Laterality: Right;  . PACEMAKER GENERATOR CHANGE     Dr. Lovena Le  . PARTIAL COLECTOMY N/A 10/05/2014   Procedure: SUBTOTAL COLECTOMY WITH ILEORECTAL ANASTOMOSIS;  Surgeon: Donnie Mesa, MD;  Location: [place];  Service: General;  Laterality: N/A;  . TRANSANAL EXCISION OF RECTAL MASS N/A 08/03/2015   Procedure: TRANSANAL MINIMALLY INVASIVE SURGERY TO RESECT RECTAL POLYP;  Surgeon: [name], MD;  Location: [facility] ORS;  Service: General;  Laterality: N/A;     Home Medications                                Prior to Admission medications   Medication Sig Start Date End Date Taking? Authorizing Provider  albuterol (PROVENTIL HFA;VENTOLIN HFA) 108 (90 Base) MCG/ACT inhaler Inhale 2 puffs into the lungs every 4 (four) hours as needed for wheezing or shortness of breath. 08/07/15   Venetia Maxon Rama, MD  albuterol (PROVENTIL) (2.5 MG/3ML) 0.083% nebulizer solution Take 2.5 mg by nebulization every 6 (six) hours as needed for wheezing or shortness of breath.    Historical Provider, MD  alum & mag hydroxide-simeth (MAALOX/MYLANTA) 200-200-20 MG/5ML suspension Take 30 mLs by mouth 3 (three) times daily.    Historical Provider, MD  aspirin EC 81 MG tablet Take 81 mg by mouth daily. Reported on 11/19/2015    Historical Provider, MD  Calcium Carbonate-Vitamin D (CALCIUM-VITAMIN D) 500-200  MG-UNIT tablet Take 1 tablet by mouth 2 (two) times daily.    Historical Provider, MD  carvedilol (COREG) 6.25 MG tablet Take 1 tablet (6.25 mg total) by mouth 2 (two) times daily with a meal. 09/23/15   Blanchie Dessert, MD  diazepam (VALIUM) 5 MG tablet Take 5 mg by mouth every 12 (twelve) hours as needed for anxiety. Reported on 11/19/2015    Historical Provider, MD  diphenoxylate-atropine (LOMOTIL) 2.5-0.025 MG tablet Take 1 tablet by mouth 4 (four) times daily as needed for diarrhea or loose stools. Patient not taking: Reported on 11/19/2015 09/25/15   Tanna Furry, MD  feeding supplement, ENSURE ENLIVE, (ENSURE ENLIVE) LIQD Take 237 mLs by mouth 2 (two) times daily between meals. Patient not taking: Reported on 11/19/2015 08/07/15  Venetia Maxon Rama, MD  finasteride (PROSCAR) 5 MG tablet Take 5 mg by mouth daily.    Historical Provider, MD  magnesium oxide (MAG-OX) 400 MG tablet Take 400 mg by mouth 2 (two) times daily.    Historical Provider, MD  omeprazole (PRILOSEC) 40 MG capsule Take 40 mg by mouth daily. Reported on 11/19/2015    Historical Provider, MD  oxyCODONE-acetaminophen (PERCOCET/ROXICET) 5-325 MG tablet Take 1 tablet by mouth every 6 (six) hours as needed for severe pain. Reported on 11/19/2015    Historical Provider, MD  pravastatin (PRAVACHOL) 10 MG tablet Take 10 mg by mouth daily.    Historical Provider, MD  predniSONE (DELTASONE) 20 MG tablet Take 3 tablets (60 mg total) by mouth daily. Patient not taking: Reported on 11/19/2015 10/27/15   Delice Bison Ward, DO  thiamine 100 MG tablet Take 100 mg by mouth daily.    Historical Provider, MD    Family History      Family History  Problem Relation Age of Onset  . Colon cancer Mother     Social History        Social History  Substance Use Topics  . Smoking status: Current Every Day Smoker    Packs/day: 0.50    Years: 66.00    Types: Cigarettes  . Smokeless tobacco: Former Systems developer     Types: Snuff, Chew     Comment: 05/27/2013 "haven't used chew or snuff in ~ 30 yr"  . Alcohol use 0.0 oz/week      Comment: occasionally     Allergies                     Review of patient's allergies indicates no known allergies.   Review of Systems Review of Systems LEVEL 5 CAVEAT: HPI and ROS limited due to age  Physical Exam Updated Vital Signs BP 107/80 (BP Location: Right Arm)   Pulse 80   Temp 100.5 F (38.1 C) (Oral)   Resp 18   SpO2 94%   Physical Exam  Constitutional: He is well-developed, well-nourished, and in no distress.  Nontoxic appearing, in no distress.  HENT:  Head: Normocephalic and atraumatic.  Eyes: Conjunctivae and EOM are normal.  Neck: Normal range of motion.  Cardiovascular: Normal rate, regular rhythm and intact distal pulses.   DP pulse 2+ in the LLE  Pulmonary/Chest: Effort normal. No respiratory distress.  Respirations even and unlabored  Musculoskeletal: Normal range of motion.  Neurological: He is alert. He exhibits normal muscle tone. Coordination normal.  GCS 15. Patient moving all extremities. Ambulatory in the department with steady gait.  Skin: Skin is warm. No pallor.  Nursing note and vitals reviewed.    ED Treatments / Results  DIAGNOSTIC STUDIES: Oxygen Saturation is 94% on RA, adquate by my interpretation.   COORDINATION OF CARE: 9:44 PM-Discussed next steps with pt. Pt verbalized understanding and is agreeable with the plan.   Labs (all labs ordered are listed, but only abnormal results are displayed) Labs Reviewed - No data to display  EKG      EKG Interpretation None      Radiology Dg Hip Unilat With Pelvis 2-3 Views Left  Result Date: 01/01/2016 CLINICAL DATA:  Left hip pain following fall today, initial encounter EXAM: DG HIP (WITH OR WITHOUT PELVIS) 2-3V LEFT COMPARISON:  11/10/2015 FINDINGS: Postoperative changes are again noted in the proximal right femur. The pelvic ring is  intact. Colonic postsurgical changes are noted as well. Diffuse vascular calcifications  are seen. No acute fracture or dislocation is noted. Mild degenerative change of the left hip joint is noted. IMPRESSION: Mild degenerative change without acute abnormality. Electronically Signed   By: Inez Catalina M.D.   On: 01/01/2016 22:36   Procedures Procedures (including critical care time)   Initial Impression / Assessment and Plan / ED Course  I have reviewed the triage vital signs and the nursing notes.  Pertinent labs & imaging results that were available during my care of the patient were reviewed by me and considered in my medical decision making (see chart for details).  Clinical Course    70 year old male, well-known to the emergency department, presents to the ED for complaints of left hip pain. He was recently released from jail. He has been ambulatory in the department without difficulty. Patient neurovascularly intact. Patient cleaned up during visit as he soiled himself with stool while awaiting his Xray.  Patient accompanied by representative from the public defender's office, concerned about the patient and his access to care. I have explained our limited ability for continuity of care given the nature of our department, but will provide resource guide. No indication for further emergent work up. Patient discharged in satisfactory condition.   Final Clinical Impressions(s) / ED Diagnoses   1. Hip pain  New Prescriptions    New Prescriptions   No medications on file   I personally performed the services described in this documentation, which was scribed in my presence. The recorded information has been reviewed and is accurate.       Antonietta Breach, PA-C 01/03/16 0409    Merrily Pew, MD 01/03/16 [phone]

## 2016-01-10 ENCOUNTER — Other Ambulatory Visit: Payer: Self-pay

## 2016-01-10 ENCOUNTER — Encounter (HOSPITAL_COMMUNITY): Payer: Self-pay

## 2016-01-10 ENCOUNTER — Emergency Department (HOSPITAL_COMMUNITY)
Admission: EM | Admit: 2016-01-10 | Discharge: 2016-01-10 | Disposition: A | Discharge status: Home / Self Care | Payer: Medicare Other | Source: Home / Self Care | Attending: Emergency Medicine | Admitting: Emergency Medicine

## 2016-01-10 ENCOUNTER — Emergency Department (HOSPITAL_COMMUNITY)
Admission: EM | Admit: 2016-01-10 | Discharge: 2016-01-10 | Disposition: A | Discharge status: Home / Self Care | Payer: Medicare Other | Attending: Emergency Medicine | Admitting: Emergency Medicine

## 2016-01-10 ENCOUNTER — Encounter (HOSPITAL_COMMUNITY): Payer: Self-pay | Admitting: Emergency Medicine

## 2016-01-10 ENCOUNTER — Emergency Department (HOSPITAL_COMMUNITY): Payer: Medicare Other

## 2016-01-10 DIAGNOSIS — R0789 Other chest pain: Secondary | ICD-10-CM

## 2016-01-10 DIAGNOSIS — Z609 Problem related to social environment, unspecified: Secondary | ICD-10-CM | POA: Insufficient documentation

## 2016-01-10 DIAGNOSIS — J449 Chronic obstructive pulmonary disease, unspecified: Secondary | ICD-10-CM | POA: Diagnosis not present

## 2016-01-10 DIAGNOSIS — I10 Essential (primary) hypertension: Secondary | ICD-10-CM | POA: Diagnosis not present

## 2016-01-10 DIAGNOSIS — Z950 Presence of cardiac pacemaker: Secondary | ICD-10-CM | POA: Insufficient documentation

## 2016-01-10 DIAGNOSIS — J441 Chronic obstructive pulmonary disease with (acute) exacerbation: Secondary | ICD-10-CM | POA: Insufficient documentation

## 2016-01-10 DIAGNOSIS — Z7982 Long term (current) use of aspirin: Secondary | ICD-10-CM

## 2016-01-10 DIAGNOSIS — Z659 Problem related to unspecified psychosocial circumstances: Secondary | ICD-10-CM

## 2016-01-10 DIAGNOSIS — R062 Wheezing: Secondary | ICD-10-CM | POA: Insufficient documentation

## 2016-01-10 DIAGNOSIS — F17210 Nicotine dependence, cigarettes, uncomplicated: Secondary | ICD-10-CM

## 2016-01-10 DIAGNOSIS — Z5321 Procedure and treatment not carried out due to patient leaving prior to being seen by health care provider: Secondary | ICD-10-CM

## 2016-01-10 DIAGNOSIS — R079 Chest pain, unspecified: Secondary | ICD-10-CM

## 2016-01-10 DIAGNOSIS — I2510 Atherosclerotic heart disease of native coronary artery without angina pectoris: Secondary | ICD-10-CM | POA: Insufficient documentation

## 2016-01-10 LAB — BASIC METABOLIC PANEL
ANION GAP: 6 (ref 5–15)
BUN: 20 mg/dL (ref 6–20)
CALCIUM: 9 mg/dL (ref 8.9–10.3)
CO2: 25 mmol/L (ref 22–32)
Chloride: 108 mmol/L (ref 101–111)
Creatinine, Ser: 0.76 mg/dL (ref 0.61–1.24)
GLUCOSE: 106 mg/dL — AB (ref 65–99)
Potassium: 4.2 mmol/L (ref 3.5–5.1)
SODIUM: 139 mmol/L (ref 135–145)

## 2016-01-10 LAB — CBC
HEMATOCRIT: 36.5 % — AB (ref 39.0–52.0)
HEMOGLOBIN: 10.9 g/dL — AB (ref 13.0–17.0)
MCH: 29 pg (ref 26.0–34.0)
MCHC: 29.9 g/dL — AB (ref 30.0–36.0)
MCV: 97.1 fL (ref 78.0–100.0)
Platelets: 188 10*3/uL (ref 150–400)
RBC: 3.76 MIL/uL — ABNORMAL LOW (ref 4.22–5.81)
RDW: 15.7 % — AB (ref 11.5–15.5)
WBC: 7.2 10*3/uL (ref 4.0–10.5)

## 2016-01-10 LAB — I-STAT TROPONIN, ED
TROPONIN I, POC: 0.05 ng/mL (ref 0.00–0.08)
Troponin i, poc: 0.03 ng/mL (ref 0.00–0.08)

## 2016-01-10 NOTE — ED Triage Notes (Signed)
Pt arrives to [facility] at this time via [facility].  Pt reports that he has had substernal and left sided chest pain that started at 0030 this morning. Per EMS pt received 324 mg ASA and sublingual NTG x 2 tablets PTA. Pt rates his chest pain at a 6/10.   Chief Complaint  Patient presents with  . Chest Pain    Pt reports substernal chest pain that started at 0030 this morning.     Past Medical History:  Diagnosis Date  . Aspiration pneumonia ([place]) 10/07/2014  . ATHEROSCLEROSIS, AORTIC 12/24/2006   Qualifier: Diagnosis of  By: Hilma Favors  DO, Beth    . Atypical angina ([place]) 12/01/2014  . 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 BPH (benign prostatic hyperplasia)   . 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 Chronic atrial fibrillation ([place])   . Chronic chest pain   . Complete heart block ([place])    St. Jude pacemaker - followed by Dr. Lovena Le  . Depression   . Essential hypertension   . Fall at nursing home    December 2014  . Fracture femur, cervicotrochanteric ([place]) 05/27/2013  . Gastric ulcer   . GIB (gastrointestinal bleeding)   . Gout attack   . History of migraine   . HLD (hyperlipidemia)   . Malingering   . Non Hodgkin's lymphoma ([place])   . Tubular adenoma of colon

## 2016-01-10 NOTE — ED Notes (Signed)
Pt provided with d/c instructions at this time. Pt verbalizes understanding of d/c instructions as well as follow up procedure after d/c.  No new RX at time of d/c. Pt ambulatory with walker at time of d/c.

## 2016-01-10 NOTE — ED Triage Notes (Signed)
Patient [place] bus stop this evening reports central chest pain /tightness onset today with SOB /wheezing and productive cough . Pt. received Albuterol 5 mg by EMS with relief.

## 2016-01-10 NOTE — ED Triage Notes (Signed)
[facility]- pt here for chest pain that started 2 hours ago. 324 of ASA given PTA. Pt has pacemaker, ekg in triage complete. Pt appears to have vomited and soiled himself.

## 2016-01-10 NOTE — ED Notes (Signed)
Pt called out twice, within seconds, asking for ham and cheese sandwich and a drink. RN notified.

## 2016-01-10 NOTE — ED Notes (Signed)
Pt. Assisted out of ED by this RN with his belongings and walker.

## 2016-01-10 NOTE — ED Provider Notes (Signed)
[facility] DEPT Provider Note   CSN: [id number] Arrival date & time: 01/10/16  [id number]  First Provider Contact:  First MD Initiated Contact with Patient 01/10/16 0502        History   Chief Complaint Chief Complaint  Patient presents with  . Chest Pain    Pt reports substernal chest pain that started at 0030 this morning.      HPI LAYSON Goodwin is a 70 y.o. male.  Patient is a 70 year old male with past medical history of heart block with pacemaker placement many years ago, chronic atrial fibrillation, and alcohol induced cardiomyopathy. He presents for evaluation of chest discomfort that started yesterday. He reports that he feels short of breath. He denies any fevers or chills. He denies any cough.  He was seen here several days ago for similar complaints, however no cause was found. This patient has a history of multiple, frequent visits for chronic chest pain. This visit appears to be no different from prior visits.   The history is provided by the patient.  Chest Pain   This is a chronic problem. Episode onset: Yesterday. The problem occurs constantly. The problem has been gradually worsening. Pain location: Left upper anterior chest. The pain is moderate. The pain does not radiate. Associated symptoms include shortness of breath. Pertinent negatives include no cough and no fever. He has tried nothing for the symptoms.    Past Medical History:  Diagnosis Date  . Aspiration pneumonia ([place]) 10/07/2014  . ATHEROSCLEROSIS, AORTIC 12/24/2006   Qualifier: Diagnosis of  By: Hilma Favors  DO, Beth    . Atypical angina ([place]) 12/01/2014  . 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 BPH (benign prostatic hyperplasia)   . 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 Chronic atrial fibrillation ([place])   . Chronic chest pain   . Complete heart  block ([place])    St. Jude pacemaker - followed by Dr. Lovena Le  . Depression   . Essential hypertension   . Fall at nursing home    December 2014  . Fracture femur, cervicotrochanteric ([place]) 05/27/2013  . Gastric ulcer   . GIB (gastrointestinal bleeding)   . Gout attack   . History of migraine   . HLD (hyperlipidemia)   . Malingering   . Non Hodgkin's lymphoma ([place])   . Tubular adenoma of colon     Patient Active Problem List   Diagnosis Date Noted  . Ileus, postoperative 08/05/2015  . Chest pain 08/05/2015  . COPD exacerbation ([place]) 08/04/2015  . Homelessness 08/04/2015  . Dysplastic rectal polyp   . Bleeding rectal polyp s/p partial proctectomy 08/03/2015   . Chronic abdominal pain   . 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 Pressure ulcer 11/16/2014  . Hx of colectomy   . History of adenomatous polyp of colon   . Rectal mass   . Essential hypertension   . Rectal bleeding   . Acute blood loss anemia   . Delirium   . Protein-calorie malnutrition, severe ([place]) 10/08/2014  . Hematochezia   . Sigmoid & cecal bleeding colon masses s/p abdominal colectomy 10/05/2014   . HLD (hyperlipidemia) 09/30/2014  . Depression 09/30/2014  . Abdominal pain 09/30/2014  . BPH (benign prostatic hyperplasia) 09/29/2014  . Thrombocytopenia ([place]) 12/24/2006  . DEPRESSION, MAJOR, MODERATE 12/24/2006  .  LEARNING DISABILITY 12/24/2006  . 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 CARDIOMYOPATHY 12/24/2006  . BLOCK, AV, COMPLETE 12/24/2006  . ATHEROSCLEROSIS, AORTIC 12/24/2006  . Atrial fibrillation ([place]) 12/24/2006  . History of other specified conditions presenting hazards to health 12/24/2006  . CHOLECYSTECTOMY, HX OF 12/24/2006    Past Surgical History:  Procedure Laterality Date  . APPENDECTOMY    . CHOLECYSTECTOMY    . COLONOSCOPY N/A 10/01/2014   Procedure: COLONOSCOPY;  Surgeon: Jerene Bears, MD;  Location[facility] ENDOSCOPY;  Service: Endoscopy;  Laterality: N/A;  .  ESOPHAGOGASTRODUODENOSCOPY (EGD) WITH PROPOFOL N/A 09/26/2014   Procedure: ESOPHAGOGASTRODUODENOSCOPY (EGD) WITH PROPOFOL;  Surgeon: Jerene Bears, MD;  Location: [facility] ENDOSCOPY;  Service: Endoscopy;  Laterality: N/A;  . FLEXIBLE SIGMOIDOSCOPY N/A 11/07/2014   Procedure: FLEXIBLE SIGMOIDOSCOPY;  Surgeon: Jerene Bears, MD;  Location: [facility] ENDOSCOPY;  Service: Endoscopy;  Laterality: N/A;  . FLEXIBLE SIGMOIDOSCOPY N/A 07/30/2015   Procedure: FLEXIBLE SIGMOIDOSCOPY;  Surgeon: Doran Stabler, MD;  Location[facility] ENDOSCOPY;  Service: Endoscopy;  Laterality: N/A;  . FLEXIBLE SIGMOIDOSCOPY N/A 08/03/2015   Procedure: FLEXIBLE SIGMOIDOSCOPY;  Surgeon: [name], MD;  Location: [facility] ORS;  Service: General;  Laterality: N/A;  . GASTRIC RESECTION    . OPEN REDUCTION OF HIP Right 05/28/2013   Procedure: OPEN REDUCTION OF HIP;  Surgeon: Renette Butters, MD;  Location: [place];  Service: Orthopedics;  Laterality: Right;  . PACEMAKER GENERATOR CHANGE     Dr. Lovena Le  . PARTIAL COLECTOMY N/A 10/05/2014   Procedure: SUBTOTAL COLECTOMY WITH ILEORECTAL ANASTOMOSIS;  Surgeon: Donnie Mesa, MD;  Location: [place];  Service: General;  Laterality: N/A;  . TRANSANAL EXCISION OF RECTAL MASS N/A 08/03/2015   Procedure: TRANSANAL MINIMALLY INVASIVE SURGERY TO RESECT RECTAL POLYP;  Surgeon: [name], MD;  Location: [facility] ORS;  Service: General;  Laterality: N/A;       Home Medications    Prior to Admission medications   Medication Sig Start Date End Date Taking? Authorizing Provider  albuterol (PROVENTIL HFA;VENTOLIN HFA) 108 (90 Base) MCG/ACT inhaler Inhale 2 puffs into the lungs every 4 (four) hours as needed for wheezing or shortness of breath. 08/07/15   Venetia Maxon Rama, MD  albuterol (PROVENTIL) (2.5 MG/3ML) 0.083% nebulizer solution Take 2.5 mg by nebulization every 6 (six) hours as needed for wheezing or shortness of breath.    Historical Provider, MD  aspirin EC 81 MG tablet Take 81 mg by mouth daily. Reported on  11/19/2015    Historical Provider, MD  carvedilol (COREG) 6.25 MG tablet Take 1 tablet (6.25 mg total) by mouth 2 (two) times daily with a meal. Patient not taking: Reported on 01/01/2016 09/23/15   Blanchie Dessert, MD  diphenoxylate-atropine (LOMOTIL) 2.5-0.025 MG tablet Take 1 tablet by mouth 4 (four) times daily as needed for diarrhea or loose stools. Patient not taking: Reported on 11/19/2015 09/25/15   Tanna Furry, MD  feeding supplement, ENSURE ENLIVE, (ENSURE ENLIVE) LIQD Take 237 mLs by mouth 2 (two) times daily between meals. Patient not taking: Reported on 11/19/2015 08/07/15   Venetia Maxon Rama, MD  oxyCODONE-acetaminophen (PERCOCET/ROXICET) 5-325 MG tablet Take 1 tablet by mouth every 6 (six) hours as needed for severe pain. Reported on 11/19/2015    Historical Provider, MD  predniSONE (DELTASONE) 20 MG tablet Take 3 tablets (60 mg total) by mouth daily. Patient not taking: Reported on 11/19/2015 10/27/15   Delice Bison Ward, DO    Family History Family History  Problem Relation Age  of Onset  . Colon cancer Mother     Social History Social History  Substance Use Topics  . Smoking status: Current Every Day Smoker    Packs/day: 0.50    Years: 66.00    Types: Cigarettes  . Smokeless tobacco: Former Systems developer    Types: Snuff, Chew     Comment: 05/27/2013 "haven't used chew or snuff in ~ 30 yr"  . Alcohol use 0.0 oz/week     Comment: occasionally     Allergies   Review of patient's allergies indicates no known allergies.   Review of Systems Review of Systems  Constitutional: Negative for fever.  Respiratory: Positive for shortness of breath. Negative for cough.   Cardiovascular: Positive for chest pain.  All other systems reviewed and are negative.    Physical Exam Updated Vital Signs BP 108/73 (BP Location: Right Arm)   Pulse 104   Temp 97.7 F (36.5 C) (Oral)   Resp 18   SpO2 96%   Physical Exam  Constitutional: He is oriented to person, place, and time. He appears  well-developed and well-nourished. No distress.  HENT:  Head: Normocephalic and atraumatic.  Mouth/Throat: Oropharynx is clear and moist.  Neck: Normal range of motion. Neck supple.  Cardiovascular: Exam reveals no friction rub.   No murmur heard. Heart is irregularly irregular.  Pulmonary/Chest: Effort normal and breath sounds normal. No respiratory distress. He has no wheezes. He has no rales.  Abdominal: Soft. Bowel sounds are normal. He exhibits no distension. There is no tenderness.  Musculoskeletal: Normal range of motion. He exhibits no edema.  Neurological: He is alert and oriented to person, place, and time. Coordination normal.  Skin: Skin is warm and dry. He is not diaphoretic.  Nursing note and vitals reviewed.    ED Treatments / Results  Labs (all labs ordered are listed, but only abnormal results are displayed) Labs Reviewed  Randolm Idol, ED    EKG  EKG Interpretation None       Radiology No results found.  Procedures Procedures (including critical care time)  Medications Ordered in ED Medications - No data to display   Initial Impression / Assessment and Plan / ED Course  I have reviewed the triage vital signs and the nursing notes.  Pertinent labs & imaging results that were available during my care of the patient were reviewed by me and considered in my medical decision making (see chart for details).  Clinical Course     Final Clinical Impressions(s) / ED Diagnoses   Final diagnoses:  None   Patients troponin is negative. He just had a full set of labs several days ago and I do not feel as though repeating this is indicated. He is well-known to the emergency department as he presents here frequently with chest pain. I feel as though he can be safely discharged with instructions to follow-up with his cardiologist.  New Prescriptions New Prescriptions   No medications on file     Veryl Speak, MD 01/10/16 [phone]

## 2016-01-10 NOTE — ED Notes (Signed)
Per security patient has left.

## 2016-01-10 NOTE — ED Notes (Signed)
Pt. Assisted with showering by Lovena Le NT. Pt. Given clean shirt and pants. Resting comfortably in room.

## 2016-01-10 NOTE — ED Notes (Signed)
Pt outside when called to revitalize

## 2016-01-10 NOTE — ED Triage Notes (Signed)
Pt observed  Walking into ED from outside .

## 2016-01-10 NOTE — ED Provider Notes (Signed)
[facility] DEPT Provider Note   CSN: [id number] Arrival date & time: 01/10/16  1020  First Provider Contact:  None       History   Chief Complaint Chief Complaint  Patient presents with  . Chest Pain  After discussion with patient, SOB is his main complain. Both are at baseline though.  HPI [name] is a 70 y.o. male.   Shortness of Breath  This is a chronic problem. Duration: years. The problem occurs continuously.The current episode started more than 1 week ago. Progression since onset: waxes and wanes. Associated symptoms include cough (stable, chronic, no change) and chest pain (stable, chronic, no change). Pertinent negatives include no fever, no sore throat, no ear pain, no vomiting, no abdominal pain and no rash. Precipitated by: unclear. Risk factors include smoking. Treatments tried: breathing treatments. The treatment provided significant relief. He has had prior hospitalizations. He has had prior ED visits. Associated medical issues include COPD, chronic lung disease and CAD. Associated medical issues do not include PE, heart failure, DVT or recent surgery.    Past Medical History:  Diagnosis Date  . Aspiration pneumonia ([place]) 10/07/2014  . ATHEROSCLEROSIS, AORTIC 12/24/2006   Qualifier: Diagnosis of  By: Hilma Favors  DO, Beth    . Atypical angina ([place]) 12/01/2014  . 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 BPH (benign prostatic hyperplasia)   . 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 Chronic atrial fibrillation ([place])   . Chronic chest pain   . Complete heart block ([place])    St. Jude pacemaker - followed by Dr. Lovena Le  . Depression   . Essential hypertension   . Fall at nursing home    December 2014  . Fracture femur, cervicotrochanteric ([place]) 05/27/2013  . Gastric ulcer   . GIB (gastrointestinal bleeding)    . Gout attack   . History of migraine   . HLD (hyperlipidemia)   . Malingering   . Non Hodgkin's lymphoma ([place])   . Tubular adenoma of colon     Patient Active Problem List   Diagnosis Date Noted  . Ileus, postoperative 08/05/2015  . Chest pain 08/05/2015  . COPD exacerbation ([place]) 08/04/2015  . Homelessness 08/04/2015  . Dysplastic rectal polyp   . Bleeding rectal polyp s/p partial proctectomy 08/03/2015   . Chronic abdominal pain   . 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 Pressure ulcer 11/16/2014  . Hx of colectomy   . History of adenomatous polyp of colon   . Rectal mass   . Essential hypertension   . Rectal bleeding   . Acute blood loss anemia   . Delirium   . Protein-calorie malnutrition, severe ([place]) 10/08/2014  . Hematochezia   . Sigmoid & cecal bleeding colon masses s/p abdominal colectomy 10/05/2014   . HLD (hyperlipidemia) 09/30/2014  . Depression 09/30/2014  . Abdominal pain 09/30/2014  . BPH (benign prostatic hyperplasia) 09/29/2014  . Thrombocytopenia ([place]) 12/24/2006  . DEPRESSION, MAJOR, MODERATE 12/24/2006  . LEARNING DISABILITY 12/24/2006  . 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 CARDIOMYOPATHY 12/24/2006  . BLOCK, AV, COMPLETE 12/24/2006  . ATHEROSCLEROSIS, AORTIC 12/24/2006  . Atrial fibrillation ([place]) 12/24/2006  . History of other specified conditions presenting hazards to health 12/24/2006  . CHOLECYSTECTOMY, HX OF 12/24/2006    Past Surgical History:  Procedure Laterality Date  . APPENDECTOMY    . CHOLECYSTECTOMY    . COLONOSCOPY N/A 10/01/2014   Procedure: COLONOSCOPY;  Surgeon: Jerene Bears, MD;  Location: [facility] ENDOSCOPY;  Service: Endoscopy;  Laterality: N/A;  . ESOPHAGOGASTRODUODENOSCOPY (EGD) WITH PROPOFOL N/A 09/26/2014   Procedure: ESOPHAGOGASTRODUODENOSCOPY (EGD) WITH PROPOFOL;  Surgeon: Jerene Bears, MD;  Location: [facility] ENDOSCOPY;  Service: Endoscopy;  Laterality: N/A;  . FLEXIBLE SIGMOIDOSCOPY N/A 11/07/2014    Procedure: FLEXIBLE SIGMOIDOSCOPY;  Surgeon: Jerene Bears, MD;  Location: [facility] ENDOSCOPY;  Service: Endoscopy;  Laterality: N/A;  . FLEXIBLE SIGMOIDOSCOPY N/A 07/30/2015   Procedure: FLEXIBLE SIGMOIDOSCOPY;  Surgeon: Doran Stabler, MD;  Location: [facility] ENDOSCOPY;  Service: Endoscopy;  Laterality: N/A;  . FLEXIBLE SIGMOIDOSCOPY N/A 08/03/2015   Procedure: FLEXIBLE SIGMOIDOSCOPY;  Surgeon: [name], MD;  Location: [facility] ORS;  Service: General;  Laterality: N/A;  . GASTRIC RESECTION    . OPEN REDUCTION OF HIP Right 05/28/2013   Procedure: OPEN REDUCTION OF HIP;  Surgeon: Renette Butters, MD;  Location: [facility];  Service: Orthopedics;  Laterality: Right;  . PACEMAKER GENERATOR CHANGE     Dr. Lovena Le  . PARTIAL COLECTOMY N/A 10/05/2014   Procedure: SUBTOTAL COLECTOMY WITH ILEORECTAL ANASTOMOSIS;  Surgeon: Donnie Mesa, MD;  Location: [facility];  Service: General;  Laterality: N/A;  . TRANSANAL EXCISION OF RECTAL MASS N/A 08/03/2015   Procedure: TRANSANAL MINIMALLY INVASIVE SURGERY TO RESECT RECTAL POLYP;  Surgeon: [name], MD;  Location: [facility] ORS;  Service: General;  Laterality: N/A;       Home Medications    Prior to Admission medications   Medication Sig Start Date End Date Taking? Authorizing Provider  albuterol (PROVENTIL HFA;VENTOLIN HFA) 108 (90 Base) MCG/ACT inhaler Inhale 2 puffs into the lungs every 4 (four) hours as needed for wheezing or shortness of breath. 08/07/15   Venetia Maxon Rama, MD  albuterol (PROVENTIL) (2.5 MG/3ML) 0.083% nebulizer solution Take 2.5 mg by nebulization every 6 (six) hours as needed for wheezing or shortness of breath.    Historical Provider, MD  aspirin EC 81 MG tablet Take 81 mg by mouth daily. Reported on 11/19/2015    Historical Provider, MD  carvedilol (COREG) 6.25 MG tablet Take 1 tablet (6.25 mg total) by mouth 2 (two) times daily with a meal. Patient not taking: Reported on 01/01/2016 09/23/15   Blanchie Dessert, MD  diphenoxylate-atropine (LOMOTIL)  2.5-0.025 MG tablet Take 1 tablet by mouth 4 (four) times daily as needed for diarrhea or loose stools. Patient not taking: Reported on 11/19/2015 09/25/15   Tanna Furry, MD  feeding supplement, ENSURE ENLIVE, (ENSURE ENLIVE) LIQD Take 237 mLs by mouth 2 (two) times daily between meals. Patient not taking: Reported on 11/19/2015 08/07/15   Venetia Maxon Rama, MD  oxyCODONE-acetaminophen (PERCOCET/ROXICET) 5-325 MG tablet Take 1 tablet by mouth every 6 (six) hours as needed for severe pain. Reported on 11/19/2015    Historical Provider, MD  predniSONE (DELTASONE) 20 MG tablet Take 3 tablets (60 mg total) by mouth daily. Patient not taking: Reported on 11/19/2015 10/27/15   Delice Bison Ward, DO    Family History Family History  Problem Relation Age of Onset  . Colon cancer Mother     Social History Social History  Substance Use Topics  . Smoking status: Current Every Day Smoker    Packs/day: 0.50    Years: 66.00    Types: Cigarettes  . Smokeless tobacco: Former Systems developer    Types: Snuff, Loss adjuster, chartered  Comment: 05/27/2013 "haven't used chew or snuff in ~ 30 yr"  . Alcohol use 0.0 oz/week     Comment: occasionally     Allergies   Review of patient's allergies indicates no known allergies.   Review of Systems Review of Systems  Constitutional: Negative for chills and fever.  HENT: Negative for ear pain and sore throat.   Eyes: Negative for pain and visual disturbance.  Respiratory: Positive for cough (stable, chronic, no change) and shortness of breath (stable, chronic, no change).   Cardiovascular: Positive for chest pain (stable, chronic, no change). Negative for palpitations.  Gastrointestinal: Negative for abdominal pain, diarrhea, nausea and vomiting.  Genitourinary: Negative for dysuria and hematuria.  Musculoskeletal: Negative for arthralgias and back pain.  Skin: Negative for color change and rash.  Neurological: Negative for seizures and syncope.  All other systems reviewed and are  negative.    Physical Exam Updated Vital Signs BP 130/98 (BP Location: Right Arm)   Pulse 99   Temp 97.8 F (36.6 C) (Oral)   Resp 16   Ht 5\' 1"  (1.549 m)   Wt 55.8 kg   SpO2 100%   BMI 23.24 kg/m   Physical Exam  Constitutional: He appears well-developed and well-nourished. No distress.  HENT:  Head: Normocephalic and atraumatic.  Eyes: Conjunctivae are normal. Right eye exhibits no discharge. Left eye exhibits no discharge.  Neck: Normal range of motion. Neck supple.  Cardiovascular: Normal rate and regular rhythm.   No murmur heard. Pulmonary/Chest: Effort normal. No respiratory distress. He has wheezes (mild diffuse). He has no rales.  Abdominal: Soft. He exhibits no distension. There is no tenderness. There is no rebound and no guarding.  Musculoskeletal: He exhibits no edema, tenderness or deformity.  Neurological: He is alert.  Skin: Skin is warm and dry. Capillary refill takes less than 2 seconds. He is not diaphoretic.  Psychiatric: He has a normal mood and affect.  Nursing note and vitals reviewed.    ED Treatments / Results  Labs (all labs ordered are listed, but only abnormal results are displayed) Labs Reviewed - No data to display  EKG  EKG Interpretation None       Radiology No results found.  Procedures Procedures (including critical care time)  Medications Ordered in ED Medications - No data to display   Initial Impression / Assessment and Plan / ED Course  I have reviewed the triage vital signs and the nursing notes.  Pertinent labs & imaging results that were available during my care of the patient were reviewed by me and considered in my medical decision making (see chart for details).  Clinical Course   70 year old male with a history of chronic chest pain and pacemaker who presents on a regular basis to the emergency department. Patient presented today for chest pain. On evaluation patient stated that his chest pain was at  baseline, did not have any worsening dyspnea on exertion, did not have any change in his chronic cough with no increased sputum production, no fevers, no chills. States that he takes his inhalers daily. Does not feel more short of breath than usual. Denies any significant leg swelling at this time. When asked directly if patient feels the same as he usually does patient answered yes. The patient was present earlier this morning and was discharged. Patient had been offered a shower during this current visit. Prior to being able to place orders, and nurse informed treatment team the patient had attempts that he was leaving  and wheeled himself away from his room. History of present illness was reassuring and did not appear to have any true new acute medical condition requiring further evaluation. As results patient was deemed safe for discharge without further medical evaluation this time. Of note patient left prior to receiving his discharge paperwork, however was instructed to follow up with cardiologist for monitoring of chest pain.  Final Clinical Impressions(s) / ED Diagnoses   Final diagnoses:  Social problem    New Prescriptions New Prescriptions   No medications on file     Darlina Rumpf, MD 01/11/16 [id number]    Pattricia Boss, MD 01/13/16 [phone]

## 2016-01-10 NOTE — ED Notes (Signed)
Pt. Waiting in wheelchair outside, pt. Assisted back to bed. MD notified that pt. Reports he is ready to leave. Pt. Given [place] sandwich and graham crackers.

## 2016-01-10 NOTE — ED Provider Notes (Signed)
Patient left without being seen  1. Patient left without being seen       Deno Etienne, DO 01/10/16 2307

## 2016-01-10 NOTE — ED Triage Notes (Signed)
Pt absent from front lobby unable to locate.

## 2016-01-11 ENCOUNTER — Emergency Department (HOSPITAL_COMMUNITY)
Admission: EM | Admit: 2016-01-11 | Discharge: 2016-01-12 | Disposition: A | Discharge status: Home / Self Care | Payer: Medicare Other | Attending: Emergency Medicine | Admitting: Emergency Medicine

## 2016-01-11 ENCOUNTER — Encounter (HOSPITAL_COMMUNITY): Payer: Self-pay | Admitting: *Deleted

## 2016-01-11 DIAGNOSIS — I10 Essential (primary) hypertension: Secondary | ICD-10-CM | POA: Insufficient documentation

## 2016-01-11 DIAGNOSIS — Z7982 Long term (current) use of aspirin: Secondary | ICD-10-CM | POA: Insufficient documentation

## 2016-01-11 DIAGNOSIS — Z950 Presence of cardiac pacemaker: Secondary | ICD-10-CM | POA: Insufficient documentation

## 2016-01-11 DIAGNOSIS — R4182 Altered mental status, unspecified: Secondary | ICD-10-CM | POA: Diagnosis not present

## 2016-01-11 DIAGNOSIS — F1290 Cannabis use, unspecified, uncomplicated: Secondary | ICD-10-CM | POA: Insufficient documentation

## 2016-01-11 DIAGNOSIS — J441 Chronic obstructive pulmonary disease with (acute) exacerbation: Secondary | ICD-10-CM | POA: Insufficient documentation

## 2016-01-11 DIAGNOSIS — G8929 Other chronic pain: Secondary | ICD-10-CM | POA: Insufficient documentation

## 2016-01-11 DIAGNOSIS — M2550 Pain in unspecified joint: Secondary | ICD-10-CM | POA: Insufficient documentation

## 2016-01-11 DIAGNOSIS — Z79899 Other long term (current) drug therapy: Secondary | ICD-10-CM | POA: Insufficient documentation

## 2016-01-11 DIAGNOSIS — I63411 Cerebral infarction due to embolism of right middle cerebral artery: Secondary | ICD-10-CM | POA: Diagnosis not present

## 2016-01-11 DIAGNOSIS — F17210 Nicotine dependence, cigarettes, uncomplicated: Secondary | ICD-10-CM | POA: Insufficient documentation

## 2016-01-11 LAB — RAPID URINE DRUG SCREEN, HOSP PERFORMED
Amphetamines: NOT DETECTED
BARBITURATES: NOT DETECTED
BENZODIAZEPINES: NOT DETECTED
COCAINE: NOT DETECTED
OPIATES: NOT DETECTED
Tetrahydrocannabinol: NOT DETECTED

## 2016-01-11 LAB — SALICYLATE LEVEL

## 2016-01-11 LAB — CBC
HCT: 35.4 % — ABNORMAL LOW (ref 39.0–52.0)
Hemoglobin: 11 g/dL — ABNORMAL LOW (ref 13.0–17.0)
MCH: 29.6 pg (ref 26.0–34.0)
MCHC: 31.1 g/dL (ref 30.0–36.0)
MCV: 95.2 fL (ref 78.0–100.0)
PLATELETS: 197 10*3/uL (ref 150–400)
RBC: 3.72 MIL/uL — ABNORMAL LOW (ref 4.22–5.81)
RDW: 15.8 % — AB (ref 11.5–15.5)
WBC: 9.7 10*3/uL (ref 4.0–10.5)

## 2016-01-11 LAB — COMPREHENSIVE METABOLIC PANEL
ALBUMIN: 3.6 g/dL (ref 3.5–5.0)
ALT: 13 U/L — ABNORMAL LOW (ref 17–63)
ANION GAP: 9 (ref 5–15)
AST: 22 U/L (ref 15–41)
Alkaline Phosphatase: 110 U/L (ref 38–126)
BUN: 22 mg/dL — ABNORMAL HIGH (ref 6–20)
CALCIUM: 8.9 mg/dL (ref 8.9–10.3)
CO2: 25 mmol/L (ref 22–32)
Chloride: 106 mmol/L (ref 101–111)
Creatinine, Ser: 0.89 mg/dL (ref 0.61–1.24)
GFR calc Af Amer: >60 mL/min (ref >60)
GFR calc non Af Amer: >60 mL/min (ref >60)
Glucose, Bld: 108 mg/dL — ABNORMAL HIGH (ref 65–99)
POTASSIUM: 4.4 mmol/L (ref 3.5–5.1)
SODIUM: 140 mmol/L (ref 135–145)
TOTAL PROTEIN: 7.2 g/dL (ref 6.5–8.1)
Total Bilirubin: 0.7 mg/dL (ref 0.3–1.2)

## 2016-01-11 LAB — ETHANOL

## 2016-01-11 LAB — ACETAMINOPHEN LEVEL: ACETAMINOPHEN (TYLENOL), SERUM: 18 ug/mL (ref 10–30)

## 2016-01-11 NOTE — ED Provider Notes (Addendum)
[facility] DEPT Provider Note   CSN: [id number] Arrival date & time: 01/11/16  1814  First Provider Contact:  First MD Initiated Contact with Patient 01/11/16 1926        History   Chief Complaint Chief Complaint  Patient presents with  . IVC    HPI [name] is a 70 y.o. male.  Patient is 70 year old male with a history of chronic chest pain, pacemaker, chronic A. fib, hypertension and a prior history of non-Hodgkin's lymphoma. He currently is homeless. He was brought in by a [employer] after [employer] were taken out on the patient. Per the IVC, a worker at the homeless shelters all the patient laying out in front of their billing period progress IVC papers, he was hallucinating and had bizarre behavior. Patient denies any suicidal or homicidal ideations. He denies any recent alcohol use. He does say they smoked a joint yesterday. He complains of some pain in his left foot but she says his gout. He denies any other current complaints. He denies any hallucinations.      Past Medical History:  Diagnosis Date  . Aspiration pneumonia ([place]) 10/07/2014  . ATHEROSCLEROSIS, AORTIC 12/24/2006   Qualifier: Diagnosis of  By: Hilma Favors  DO, Beth    . Atypical angina ([place]) 12/01/2014  . 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 BPH (benign prostatic hyperplasia)   . 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 Chronic atrial fibrillation ([place])   . Chronic chest pain   . Complete heart block ([place])    St. Jude pacemaker - followed by Dr. Lovena Le  . Depression   . Essential hypertension   . Fall at nursing home    December 2014  . Fracture femur, cervicotrochanteric ([place]) 05/27/2013  . Gastric ulcer   . GIB (gastrointestinal bleeding)   . Gout attack   . History of migraine   . HLD (hyperlipidemia)   .  Malingering   . Non Hodgkin's lymphoma ([place])   . Tubular adenoma of colon     Patient Active Problem List   Diagnosis Date Noted  . Ileus, postoperative 08/05/2015  . Chest pain 08/05/2015  . COPD exacerbation ([place]) 08/04/2015  . Homelessness 08/04/2015  . Dysplastic rectal polyp   . Bleeding rectal polyp s/p partial proctectomy 08/03/2015   . Chronic abdominal pain   . 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 Pressure ulcer 11/16/2014  . Hx of colectomy   . History of adenomatous polyp of colon   . Rectal mass   . Essential hypertension   . Rectal bleeding   . Acute blood loss anemia   . Delirium   . Protein-calorie malnutrition, severe ([place]) 10/08/2014  . Hematochezia   . Sigmoid & cecal bleeding colon masses s/p abdominal colectomy 10/05/2014   . HLD (hyperlipidemia) 09/30/2014  . Depression 09/30/2014  . Abdominal pain 09/30/2014  . BPH (benign prostatic hyperplasia) 09/29/2014  . Thrombocytopenia ([place]) 12/24/2006  . DEPRESSION, MAJOR, MODERATE 12/24/2006  . LEARNING DISABILITY 12/24/2006  . 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 CARDIOMYOPATHY 12/24/2006  . BLOCK, AV, COMPLETE 12/24/2006  . ATHEROSCLEROSIS, AORTIC 12/24/2006  . Atrial fibrillation ([place]) 12/24/2006  . History of other specified conditions presenting hazards to health 12/24/2006  . CHOLECYSTECTOMY, HX OF 12/24/2006    Past Surgical  History:  Procedure Laterality Date  . APPENDECTOMY    . CHOLECYSTECTOMY    . COLONOSCOPY N/A 10/01/2014   Procedure: COLONOSCOPY;  Surgeon: Jerene Bears, MD;  Location[facility] END[facility]COPY;  Service: Endoscopy;  Laterality: N/A;  . ESOPHAGOGASTRODUODENOSCOPY (EGD) WITH PROPOFOL N/A 09/26/2014   Procedure: ESOPHAGOGASTRODUODENOSCOPY (EGD) WITH PROPOFOL;  Surgeon: Jerene Bears, MD;  Location: [facility] ENDOSCOPY;  Service: Endoscopy;  Laterality: N/A;  . FLEXIBLE SIGMOIDOSCOPY N/A 11/07/2014   Procedure: FLEXIBLE SIGMOIDOSCOPY;  Surgeon: Jerene Bears, MD;  Location: [facility]  ENDOSCOPY;  Service: Endoscopy;  Laterality: N/A;  . FLEXIBLE SIGMOIDOSCOPY N/A 07/30/2015   Procedure: FLEXIBLE SIGMOIDOSCOPY;  Surgeon: Doran Stabler, MD;  Location: [facility] ENDOSCOPY;  Service: Endoscopy;  Laterality: N/A;  . FLEXIBLE SIGMOIDOSCOPY N/A 08/03/2015   Procedure: FLEXIBLE SIGMOIDOSCOPY;  Surgeon: [name], MD;  Location: [facility] ORS;  Service: General;  Laterality: N/A;  . GASTRIC RESECTION    . OPEN REDUCTION OF HIP Right 05/28/2013   Procedure: OPEN REDUCTION OF HIP;  Surgeon: Renette Butters, MD;  Location: [facility];  Service: Orthopedics;  Laterality: Right;  . PACEMAKER GENERATOR CHANGE     Dr. Lovena Le  . PARTIAL COLECTOMY N/A 10/05/2014   Procedure: SUBTOTAL COLECTOMY WITH ILEORECTAL ANASTOMOSIS;  Surgeon: Donnie Mesa, MD;  Location: [facility];  Service: General;  Laterality: N/A;  . TRANSANAL EXCISION OF RECTAL MASS N/A 08/03/2015   Procedure: TRANSANAL MINIMALLY INVASIVE SURGERY TO RESECT RECTAL POLYP;  Surgeon: [name], MD;  Location: [facility] ORS;  Service: General;  Laterality: N/A;       Home Medications    Prior to Admission medications   Medication Sig Start Date End Date Taking? Authorizing Provider  albuterol (PROVENTIL) (2.5 MG/3ML) 0.083% nebulizer solution Take 2.5 mg by nebulization every 6 (six) hours as needed for wheezing or shortness of breath.   Yes Historical Provider, MD  aspirin EC 81 MG tablet Take 81 mg by mouth daily. Reported on 11/19/2015   Yes Historical Provider, MD  carvedilol (COREG) 6.25 MG tablet Take 1 tablet (6.25 mg total) by mouth 2 (two) times daily with a meal. 09/23/15  Yes Blanchie Dessert, MD  oxyCODONE-acetaminophen (PERCOCET/ROXICET) 5-325 MG tablet Take 1 tablet by mouth every 6 (six) hours as needed for severe pain. Reported on 11/19/2015   Yes Historical Provider, MD  albuterol (PROVENTIL HFA;VENTOLIN HFA) 108 (90 Base) MCG/ACT inhaler Inhale 2 puffs into the lungs every 4 (four) hours as needed for wheezing or shortness of breath.  08/07/15   Venetia Maxon Rama, MD  diphenoxylate-atropine (LOMOTIL) 2.5-0.025 MG tablet Take 1 tablet by mouth 4 (four) times daily as needed for diarrhea or loose stools. Patient not taking: Reported on 11/19/2015 09/25/15   Tanna Furry, MD  feeding supplement, ENSURE ENLIVE, (ENSURE ENLIVE) LIQD Take 237 mLs by mouth 2 (two) times daily between meals. Patient not taking: Reported on 11/19/2015 08/07/15   Venetia Maxon Rama, MD  predniSONE (DELTASONE) 20 MG tablet Take 3 tablets (60 mg total) by mouth daily. Patient not taking: Reported on 11/19/2015 10/27/15   Delice Bison Ward, DO    Family History Family History  Problem Relation Age of Onset  . Colon cancer Mother     Social History Social History  Substance Use Topics  . Smoking status: Current Every Day Smoker    Types: Cigarettes  . Smokeless tobacco: Former Systems developer    Types: Snuff, Chew     Comment: 05/27/2013 "haven't used chew or snuff in ~ 30 yr"  .  Alcohol use 0.0 oz/week     Comment: occasionally     Allergies   Review of patient's allergies indicates no known allergies.   Review of Systems Review of Systems  Constitutional: Negative for chills, diaphoresis, fatigue and fever.  HENT: Negative for congestion, rhinorrhea and sneezing.   Eyes: Negative.   Respiratory: Negative for cough, chest tightness and shortness of breath.   Cardiovascular: Negative for chest pain and leg swelling.  Gastrointestinal: Negative for abdominal pain, blood in stool, diarrhea, nausea and vomiting.  Genitourinary: Negative for difficulty urinating, flank pain, frequency and hematuria.  Musculoskeletal: Positive for arthralgias. Negative for back pain.  Skin: Negative for rash.  Neurological: Negative for dizziness, speech difficulty, weakness, numbness and headaches.     Physical Exam Updated Vital Signs BP 124/65 (BP Location: Left Arm)   Pulse 76   Temp 99.5 F (37.5 C) (Oral)   Resp 17   Ht 5\' 9"  (1.753 m)   Wt 90 lb (40.8 kg)   SpO2  99%   BMI 13.29 kg/m   Physical Exam  Constitutional: He is oriented to person, place, and time. He appears well-developed and well-nourished.  HENT:  Head: Normocephalic and atraumatic.  Eyes: Pupils are equal, round, and reactive to light.  Neck: Normal range of motion. Neck supple.  Cardiovascular: Normal rate, regular rhythm and normal heart sounds.   Pulmonary/Chest: Effort normal and breath sounds normal. No respiratory distress. He has no wheezes. He has no rales. He exhibits no tenderness.  Abdominal: Soft. Bowel sounds are normal. There is no tenderness. There is no rebound and no guarding.  Musculoskeletal: Normal range of motion. He exhibits no edema.  There is mild swelling of the left ankle and foot. Mild erythema but no significant warmth. He has some scabs to the pretibial area. There is no drainage or overlying signs of infection.  Lymphadenopathy:    He has no cervical adenopathy.  Neurological: He is alert and oriented to person, place, and time.  Skin: Skin is warm and dry. No rash noted.  Psychiatric: He has a normal mood and affect.     ED Treatments / Results  Labs (all labs ordered are listed, but only abnormal results are displayed) Labs Reviewed  COMPREHENSIVE METABOLIC PANEL - Abnormal; Notable for the following:       Result Value   Glucose, Bld 108 (*)    BUN 22 (*)    ALT 13 (*)    All other components within normal limits  CBC - Abnormal; Notable for the following:    RBC 3.72 (*)    Hemoglobin 11.0 (*)    HCT 35.4 (*)    RDW 15.8 (*)    All other components within normal limits  ETHANOL  SALICYLATE LEVEL  ACETAMINOPHEN LEVEL  URINE RAPID DRUG SCREEN, HOSP PERFORMED    EKG  EKG Interpretation None       Radiology Dg Chest 2 View  Result Date: 01/10/2016 CLINICAL DATA:  70 year old male with mid chest pain onset this morning. Shortness of breath. Initial encounter. Smoker. EXAM: CHEST  2 VIEW COMPARISON:  10/26/2015 and earlier.  FINDINGS: Seated AP and lateral views of the chest. Stable cardiomegaly and mediastinal contours. Stable right chest to lead cardiac pacemaker. Pulmonary hyperinflation. No pneumothorax, pulmonary edema, pleural effusion or confluent pulmonary opacity. Visualized tracheal air column is within normal limits. Osteopenia. No acute osseous abnormality identified. Calcified aortic atherosclerosis. IMPRESSION: Chronic pulmonary hyperinflation and cardiomegaly. No acute cardiopulmonary abnormality. Calcified aortic atherosclerosis.  Electronically Signed   By: Genevie Ann M.D.   On: 01/10/2016 21:44    Procedures Procedures (including critical care time)  Medications Ordered in ED Medications - No data to display   Initial Impression / Assessment and Plan / ED Course  I have reviewed the triage vital signs and the nursing notes.  Pertinent labs & imaging results that were available during my care of the patient were reviewed by me and considered in my medical decision making (see chart for details).  Clinical Course    Patient has been medically clear. He has some chronic conditions which appear to be unchanged from his baseline. He's been evaluated by TTS and it is felt that he needs to stay overnight for evaluation the morning.  Final Clinical Impressions(s) / ED Diagnoses   Final diagnoses:  Hallucinations    New Prescriptions New Prescriptions   No medications on file     Malvin Johns, MD 01/11/16 [place], MD 01/11/16 [id number]
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
## 2016-01-11 NOTE — BH Assessment (Signed)
Assessment completed. Consulted with Darlyne Russian, PA-C who recommends overnight observation and evaluation in the morning by psychiatry in the morning.   Rosalin Hawking, LCSW Therapeutic Triage Specialist [place] 01/11/2016 11:35 PM

## 2016-01-11 NOTE — ED Notes (Signed)
Pt has in belonging bag:  Grey pants, white socks, greyish shirt.

## 2016-01-11 NOTE — ED Notes (Signed)
Patient found wandering in hallway. Patient redirected to room by security. Patient instructed to remain in room and not wander into other patient's rooms.

## 2016-01-12 ENCOUNTER — Encounter (HOSPITAL_COMMUNITY): Payer: Self-pay | Admitting: Emergency Medicine

## 2016-01-12 ENCOUNTER — Emergency Department (HOSPITAL_COMMUNITY)
Admission: EM | Admit: 2016-01-12 | Discharge: 2016-01-13 | Disposition: A | Discharge status: Home / Self Care | Payer: Medicare Other | Source: Home / Self Care | Attending: Emergency Medicine | Admitting: Emergency Medicine

## 2016-01-12 ENCOUNTER — Emergency Department (HOSPITAL_COMMUNITY)
Admission: EM | Admit: 2016-01-12 | Discharge: 2016-01-12 | Discharge status: Against Medical Advice | Payer: Medicare Other | Source: Home / Self Care | Attending: Emergency Medicine | Admitting: Emergency Medicine

## 2016-01-12 DIAGNOSIS — Z5900 Homelessness unspecified: Secondary | ICD-10-CM

## 2016-01-12 DIAGNOSIS — F17210 Nicotine dependence, cigarettes, uncomplicated: Secondary | ICD-10-CM

## 2016-01-12 DIAGNOSIS — Z046 Encounter for general psychiatric examination, requested by authority: Secondary | ICD-10-CM

## 2016-01-12 DIAGNOSIS — J441 Chronic obstructive pulmonary disease with (acute) exacerbation: Secondary | ICD-10-CM

## 2016-01-12 DIAGNOSIS — R0789 Other chest pain: Secondary | ICD-10-CM | POA: Insufficient documentation

## 2016-01-12 DIAGNOSIS — Z7982 Long term (current) use of aspirin: Secondary | ICD-10-CM

## 2016-01-12 DIAGNOSIS — I10 Essential (primary) hypertension: Secondary | ICD-10-CM

## 2016-01-12 DIAGNOSIS — F321 Major depressive disorder, single episode, moderate: Secondary | ICD-10-CM | POA: Diagnosis present

## 2016-01-12 DIAGNOSIS — R258 Other abnormal involuntary movements: Secondary | ICD-10-CM

## 2016-01-12 DIAGNOSIS — R079 Chest pain, unspecified: Secondary | ICD-10-CM

## 2016-01-12 MED ORDER — ALBUTEROL SULFATE HFA 108 (90 BASE) MCG/ACT IN AERS
2.0000 | INHALATION_SPRAY | RESPIRATORY_TRACT | Status: DC | PRN
  Administered 2016-01-13 (×3): 2 via RESPIRATORY_TRACT
  Filled 2016-01-12: qty 6.7

## 2016-01-12 NOTE — Progress Notes (Signed)
Patient is requesting an inhaler

## 2016-01-12 NOTE — ED Provider Notes (Signed)
[facility] DEPT Provider Note   CSN: [id number] Arrival date & time: 01/12/16  1000  First Provider Contact:  First MD Initiated Contact with Patient 01/12/16 1137        History   Chief Complaint Chief Complaint  Patient presents with  . Chest Pain    HPI [name] is a 70 y.o. male.  70 yo M with a chief complaint of chest pain. This is a chronic issue for this patient. This is 73rd visit in the last 6 months. Patient states that the pain feels just like his typical pain. Is left-sided and sharp. Has no radiation. Denies cough congestion fevers. He denies shortness of breath. Denies syncope. He denies diaphoresis nausea or vomiting. The pain is worse with movement or palpation.   The history is provided by the patient.  Chest Pain  Description: sharp, left sided. Severity:  Moderate Onset quality:  Gradual Duration:  2 days Timing:  Constant Progression:  Worsening Chronicity:  Chronic Relieved by:  Nothing Exacerbated by: palpation, pressure. Ineffective treatments:  None tried Associated symptoms: blood in stool and chest pain   Associated symptoms: no diarrhea, no headaches, no palpitations, no shortness of breath and no vomiting     Past Medical History:  Diagnosis Date  . Aspiration pneumonia ([place]) 10/07/2014  . ATHEROSCLEROSIS, AORTIC 12/24/2006   Qualifier: Diagnosis of  By: Hilma Favors  DO, Beth    . Atypical angina ([place]) 12/01/2014  . 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 BPH (benign prostatic hyperplasia)   . 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 Chronic atrial fibrillation ([place])   . Chronic chest pain   . Complete heart block ([place])    St. Jude pacemaker - followed by Dr. Lovena Le  . Depression   . Essential hypertension   . Fall at nursing home    December 2014  . Fracture femur,  cervicotrochanteric ([place]) 05/27/2013  . Gastric ulcer   . GIB (gastrointestinal bleeding)   . Gout attack   . History of migraine   . HLD (hyperlipidemia)   . Malingering   . Non Hodgkin's lymphoma ([place])   . Tubular adenoma of colon     Patient Active Problem List   Diagnosis Date Noted  . Ileus, postoperative 08/05/2015  . Chest pain 08/05/2015  . COPD exacerbation ([place]) 08/04/2015  . Homelessness 08/04/2015  . Dysplastic rectal polyp   . Bleeding rectal polyp s/p partial proctectomy 08/03/2015   . Chronic abdominal pain   . 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 Pressure ulcer 11/16/2014  . Hx of colectomy   . History of adenomatous polyp of colon   . Rectal mass   . Essential hypertension   . Rectal bleeding   . Acute blood loss anemia   . Delirium   . Protein-calorie malnutrition, severe ([place]) 10/08/2014  . Hematochezia   . Sigmoid & cecal bleeding colon masses s/p abdominal colectomy 10/05/2014   . HLD (hyperlipidemia) 09/30/2014  . Depression 09/30/2014  . Abdominal pain 09/30/2014  . BPH (benign prostatic hyperplasia) 09/29/2014  . Thrombocytopenia ([place]) 12/24/2006  . DEPRESSION, MAJOR, MODERATE 12/24/2006  . LEARNING DISABILITY 12/24/2006  . 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 CARDIOMYOPATHY 12/24/2006  . BLOCK, AV, COMPLETE 12/24/2006  . ATHEROSCLEROSIS, AORTIC 12/24/2006  . Atrial fibrillation (  [place]) 12/24/2006  . History of other specified conditions presenting hazards to health 12/24/2006  . CHOLECYSTECTOMY, HX OF 12/24/2006    Past Surgical History:  Procedure Laterality Date  . APPENDECTOMY    . CHOLECYSTECTOMY    . COLONOSCOPY N/A 10/01/2014   Procedure: COLONOSCOPY;  Surgeon: Jerene Bears, MD;  Location: [facility] ENDOSCOPY;  Service: Endoscopy;  Laterality: N/A;  . ESOPHAGOGASTRODUODENOSCOPY (EGD) WITH PROPOFOL N/A 09/26/2014   Procedure: ESOPHAGOGASTRODUODENOSCOPY (EGD) WITH PROPOFOL;  Surgeon: Jerene Bears, MD;  Location: [facility]  ENDOSCOPY;  Service: Endoscopy;  Laterality: N/A;  . FLEXIBLE SIGMOIDOSCOPY N/A 11/07/2014   Procedure: FLEXIBLE SIGMOIDOSCOPY;  Surgeon: Jerene Bears, MD;  Location: [facility] ENDOSCOPY;  Service: Endoscopy;  Laterality: N/A;  . FLEXIBLE SIGMOIDOSCOPY N/A 07/30/2015   Procedure: FLEXIBLE SIGMOIDOSCOPY;  Surgeon: Doran Stabler, MD;  [facility]OS[facility];  Service: Endoscopy;  Laterality: N/A;  . FLEXIBLE SIGMOIDOSCOPY N/A 08/03/2015   Procedure: FLEXIBLE SIGMOIDOSCOPY;  Surgeon: [name], MD;  [facility] ORS;  Service: General;  Laterality: N/A;  . GASTRIC RESECTION    . OPEN REDUCTION OF HIP Right 05/28/2013   Procedure: OPEN REDUCTION OF HIP;  Surgeon: Renette Butters, MD;  Location: [place];  Service: Orthopedics;  Laterality: Right;  . PACEMAKER GENERATOR CHANGE     Dr. Lovena Le  . PARTIAL COLECTOMY N/A 10/05/2014   Procedure: SUBTOTAL COLECTOMY WITH ILEORECTAL ANASTOMOSIS;  Surgeon: Donnie Mesa, MD;  Location: [place];  Service: General;  Laterality: N/A;  . TRANSANAL EXCISION OF RECTAL MASS N/A 08/03/2015   Procedure: TRANSANAL MINIMALLY INVASIVE SURGERY TO RESECT RECTAL POLYP;  Surgeon: [name], MD;  [facility]S;  Service: General;  Laterality: N/A;       Home Medications    Prior to Admission medications   Medication Sig Start Date End Date Taking? Authorizing Provider  albuterol (PROVENTIL HFA;VENTOLIN HFA) 108 (90 Base) MCG/ACT inhaler Inhale 2 puffs into the lungs every 4 (four) hours as needed for wheezing or shortness of breath. 08/07/15  Yes Venetia Maxon Rama, MD  albuterol (PROVENTIL) (2.5 MG/3ML) 0.083% nebulizer solution Take 2.5 mg by nebulization every 6 (six) hours as needed for wheezing or shortness of breath.   Yes Historical Provider, MD  aspirin EC 81 MG tablet Take 81 mg by mouth daily. Reported on 11/19/2015   Yes Historical Provider, MD  carvedilol (COREG) 6.25 MG tablet Take 1 tablet (6.25 mg total) by mouth 2 (two) times daily with a meal. 09/23/15  Yes  Blanchie Dessert, MD  oxyCODONE-acetaminophen (PERCOCET/ROXICET) 5-325 MG tablet Take 1 tablet by mouth every 6 (six) hours as needed for severe pain. Reported on 11/19/2015   Yes Historical Provider, MD  diphenoxylate-atropine (LOMOTIL) 2.5-0.025 MG tablet Take 1 tablet by mouth 4 (four) times daily as needed for diarrhea or loose stools. Patient not taking: Reported on 11/19/2015 09/25/15   Tanna Furry, MD  feeding supplement, ENSURE ENLIVE, (ENSURE ENLIVE) LIQD Take 237 mLs by mouth 2 (two) times daily between meals. Patient not taking: Reported on 11/19/2015 08/07/15   Venetia Maxon Rama, MD  predniSONE (DELTASONE) 20 MG tablet Take 3 tablets (60 mg total) by mouth daily. Patient not taking: Reported on 11/19/2015 10/27/15   Delice Bison Ward, DO    Family History Family History  Problem Relation Age of Onset  . Colon cancer Mother     Social History Social History  Substance Use Topics  . Smoking status: Current Every Day Smoker    Types: Cigarettes  . Smokeless  tobacco: Former Systems developer    Types: Snuff, Chew     Comment: 05/27/2013 "haven't used chew or snuff in ~ 30 yr"  . Alcohol use 0.0 oz/week     Comment: occasionally     Allergies   Review of patient's allergies indicates no known allergies.   Review of Systems Review of Systems  Constitutional: Negative for chills and fever.  HENT: Negative for congestion and facial swelling.   Eyes: Negative for discharge and visual disturbance.  Respiratory: Negative for shortness of breath.   Cardiovascular: Positive for chest pain. Negative for palpitations.  Gastrointestinal: Positive for blood in stool. Negative for abdominal pain, diarrhea and vomiting.  Musculoskeletal: Negative for arthralgias and myalgias.  Skin: Negative for color change and rash.  Neurological: Negative for tremors, syncope and headaches.  Psychiatric/Behavioral: Negative for confusion and dysphoric mood.     Physical Exam Updated Vital Signs BP 105/91 (BP  Location: Right Arm)   Pulse 79   Temp 98.2 F (36.8 C) (Oral)   Resp 14   Ht 5\' 9"  (1.753 m)   Wt 90 lb (40.8 kg)   SpO2 100%   BMI 13.29 kg/m   Physical Exam  Constitutional: He is oriented to person, place, and time. He appears well-developed and well-nourished.  disheveled   HENT:  Head: Normocephalic and atraumatic.  Eyes: Conjunctivae and EOM are normal. Pupils are equal, round, and reactive to light.  Neck: Normal range of motion. No JVD present.  Cardiovascular: Normal rate and regular rhythm.   Pulmonary/Chest: Effort normal. No stridor. No respiratory distress.  Abdominal: He exhibits no distension. There is no tenderness. There is no guarding.  Genitourinary: Rectal exam shows no external hemorrhoid and no internal hemorrhoid.  Genitourinary Comments: Soft brown stool  Musculoskeletal: Normal range of motion. He exhibits no edema.  Neurological: He is alert and oriented to person, place, and time.  Skin: Skin is warm and dry.  Psychiatric: He has a normal mood and affect. His behavior is normal.     ED Treatments / Results  Labs (all labs ordered are listed, but only abnormal results are displayed) Labs Reviewed - No data to display  EKG  EKG Interpretation None       Radiology Dg Chest 2 View  Result Date: 01/10/2016 CLINICAL DATA:  70 year old male with mid chest pain onset this morning. Shortness of breath. Initial encounter. Smoker. EXAM: CHEST  2 VIEW COMPARISON:  10/26/2015 and earlier. FINDINGS: Seated AP and lateral views of the chest. Stable cardiomegaly and mediastinal contours. Stable right chest to lead cardiac pacemaker. Pulmonary hyperinflation. No pneumothorax, pulmonary edema, pleural effusion or confluent pulmonary opacity. Visualized tracheal air column is within normal limits. Osteopenia. No acute osseous abnormality identified. Calcified aortic atherosclerosis. IMPRESSION: Chronic pulmonary hyperinflation and cardiomegaly. No acute  cardiopulmonary abnormality. Calcified aortic atherosclerosis. Electronically Signed   By: Genevie Ann M.D.   On: 01/10/2016 21:44    Procedures Procedures (including critical care time)  Medications Ordered in ED Medications - No data to display   Initial Impression / Assessment and Plan / ED Course  I have reviewed the triage vital signs and the nursing notes.  Pertinent labs & imaging results that were available during my care of the patient were reviewed by me and considered in my medical decision making (see chart for details).  Clinical Course    70 yo M With a chief complaint chest pain. Is reproducible on exam. EKG is unchanged from prior.  Of note the  patient had a Nurse, adult show up and question why he wasn't being admitted. Social work consult for shelter availability.  3:23 PM:  I have discussed the diagnosis/risks/treatment options with the patient and family and believe the pt to be eligible for discharge home to follow-up with PCP. We also discussed returning to the ED immediately if new or worsening sx occur. We discussed the sx which are most concerning (e.g., sudden worsening pain, fever, inability to tolerate by mouth) that necessitate immediate return. Medications administered to the patient during their visit and any new prescriptions provided to the patient are listed below.  Medications given during this visit Medications - No data to display   The patient appears reasonably screen and/or stabilized for discharge and I doubt any other medical condition or other [facility] requiring further screening, evaluation, or treatment in the ED at this time prior to discharge.    Final Clinical Impressions(s) / ED Diagnoses   Final diagnoses:  Nonspecific chest pain    New Prescriptions Discharge Medication List as of 01/12/2016  2:24 PM       Deno Etienne, DO 01/12/16 1523

## 2016-01-12 NOTE — Progress Notes (Signed)
Patient has been noted to be seen in the ED 72 times within the last six months. Patient listed as having Medicare, Medicaid, [place] Medicaid. Pcp listed as Dr. [name]. This patient has been placed multiple times to group homes and ALF's.  Patient is homeless. Patient has been given homeless resources multiple times.   Per previous CM note, patient has been banned from the [place]. No further EDCM needs at this time.

## 2016-01-12 NOTE — ED Notes (Signed)
Pt denies cp at this time. Pt given crackers, sandwich, soup, and sprite upon request.

## 2016-01-12 NOTE — ED Triage Notes (Signed)
Pt reports he began to have CP after his discharge from the Emergency Department yesterday. EMS picked up pt from where he was laying down on the side of the street. Pt alert and oriented, NAD. Pt says his CP feels like his normal CP. Also reports weakness.
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
## 2016-01-12 NOTE — Progress Notes (Addendum)
Call received from ED Secretary stating patient's public defender intern wanted to speak with social worker.   CSW spoke with patient and he stated he came to the hospital due to chest pains. Patient stated he is homeless. CSW informed patient that he could be provided resources for the shelter. The public defender intern states he has concerns about patient safety and wanting patient to stay in the hospital for a longer period. CSW informed him that the decision for patient to be admitted is for the physician to decide. He was also informed that patient could be provided shelter resources.   Staffed case with EDP and he states patient has been medically cleared. CSW informed EDP that shelter resources would be given to patient.   Staffed case with Asst. Director of Social Work. He stated to give patient resources and complete VI-SPDAT to assist with housing for patient. CSW could not find patient when went to give shelter resources and and complete form. Staffed with Nurse.  Candis Schatz, Public Defender Intern [phone]   Genice Rouge [id number] ED CSW 01/12/2016 3:14 PM

## 2016-01-12 NOTE — ED Provider Notes (Signed)
[facility] DEPT Provider Note   CSN: [id number] Arrival date & time: 01/12/16  [id number]  First Provider Contact:  None       History   Chief Complaint Chief Complaint  Patient presents with  . IVC    HPI CLEDIS SMITHWICK is a 70 y.o. male.  70 year old male with extensive past medical history including atrial fibrillation, cardiomyopathy, non-Hodgkin's lymphoma, chronic chest pain who presents for evaluation after IVC. The patient was evaluated yesterday for IVC completed by a worker at the homeless shelter. He was evaluated by TTS and eventually was discharged as he was not endorsing any SI or HI at time of discharge. He returns today for IVC that is very similar to yesterday, completed by the same person. IVC notes that the patient is "danger to self and others. Advanced dementia and is exhibiting hallucinations. Smearing feces on himself. Seen wandering next to the road. Petitioner is very concerned for his well-being and safety." The patient himself denies any suicidal thoughts, problems with depression, thoughts of wanting to hurt anyone else. He reports chronic pain and swelling everywhere. He also requests a breathing treatment.   The history is provided by the patient and the police.    Past Medical History:  Diagnosis Date  . Aspiration pneumonia ([place]) 10/07/2014  . ATHEROSCLEROSIS, AORTIC 12/24/2006   Qualifier: Diagnosis of  By: Hilma Favors  DO, Beth    . Atypical angina ([place]) 12/01/2014  . 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 BPH (benign prostatic hyperplasia)   . 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 Chronic atrial fibrillation ([place])   . Chronic chest pain   . Complete heart block ([place])    St. Jude pacemaker - followed by Dr. Lovena Le  . Depression   . Essential hypertension   . Fall at nursing home    December  2014  . Fracture femur, cervicotrochanteric ([place]) 05/27/2013  . Gastric ulcer   . GIB (gastrointestinal bleeding)   . Gout attack   . History of migraine   . HLD (hyperlipidemia)   . Malingering   . Non Hodgkin's lymphoma ([place])   . Tubular adenoma of colon     Patient Active Problem List   Diagnosis Date Noted  . Ileus, postoperative 08/05/2015  . Chest pain 08/05/2015  . COPD exacerbation ([place]) 08/04/2015  . Homelessness 08/04/2015  . Dysplastic rectal polyp   . Bleeding rectal polyp s/p partial proctectomy 08/03/2015   . Chronic abdominal pain   . 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 Pressure ulcer 11/16/2014  . Hx of colectomy   . History of adenomatous polyp of colon   . Rectal mass   . Essential hypertension   . Rectal bleeding   . Acute blood loss anemia   . Delirium   . Protein-calorie malnutrition, severe ([place]) 10/08/2014  . Hematochezia   . Sigmoid & cecal bleeding colon masses s/p abdominal colectomy 10/05/2014   . HLD (hyperlipidemia) 09/30/2014  . Depression 09/30/2014  . Abdominal pain 09/30/2014  . BPH (benign prostatic hyperplasia) 09/29/2014  . Thrombocytopenia ([place]) 12/24/2006  . DEPRESSION, MAJOR, MODERATE 12/24/2006  . LEARNING DISABILITY 12/24/2006  . 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 CARDIOMYOPATHY 12/24/2006  . BLOCK, AV, COMPLETE 12/24/2006  . ATHEROSCLEROSIS, AORTIC 12/24/2006  . Atrial fibrillation ([place]) 12/24/2006  .  History of other specified conditions presenting hazards to health 12/24/2006  . CHOLECYSTECTOMY, HX OF 12/24/2006    Past Surgical History:  Procedure Laterality Date  . APPENDECTOMY    . CHOLECYSTECTOMY    . COLONOSCOPY N/A 10/01/2014   Procedure: COLONOSCOPY;  Surgeon: Jerene Bears, MD;  Location: [facility] ENDOSCOPY;  Service: Endoscopy;  Laterality: N/A;  . ESOPHAGOGASTRODUODENOSCOPY (EGD) WITH PROPOFOL N/A 09/26/2014   Procedure: ESOPHAGOGASTRODUODENOSCOPY (EGD) WITH PROPOFOL;  Surgeon: Jerene Bears,  MD;  Location: [facility] ENDOSCOPY;  Service: Endoscopy;  Laterality: N/A;  . FLEXIBLE SIGMOIDOSCOPY N/A 11/07/2014   Procedure: FLEXIBLE SIGMOIDOSCOPY;  Surgeon: Jerene Bears, MD;  Location: [facility] ENDOSCOPY;  Service: Endoscopy;  Laterality: N/A;  . FLEXIBLE SIGMOIDOSCOPY N/A 07/30/2015   Procedure: FLEXIBLE SIGMOIDOSCOPY;  Surgeon: Doran Stabler, MD;  Location: [facility] ENDOSCOPY;  Service: Endoscopy;  Laterality: N/A;  . FLEXIBLE SIGMOIDOSCOPY N/A 08/03/2015   Procedure: FLEXIBLE SIGMOIDOSCOPY;  Surgeon: [name], MD;  Location: [facility] ORS;  Service: General;  Laterality: N/A;  . GASTRIC RESECTION    . OPEN REDUCTION OF HIP Right 05/28/2013   Procedure: OPEN REDUCTION OF HIP;  Surgeon: Renette Butters, MD;  Location: [facility];  Service: Orthopedics;  Laterality: Right;  . PACEMAKER GENERATOR CHANGE     Dr. Lovena Le  . PARTIAL COLECTOMY N/A 10/05/2014   Procedure: SUBTOTAL COLECTOMY WITH ILEORECTAL ANASTOMOSIS;  Surgeon: Donnie Mesa, MD;  Location: [facility];  Service: General;  Laterality: N/A;  . TRANSANAL EXCISION OF RECTAL MASS N/A 08/03/2015   Procedure: TRANSANAL MINIMALLY INVASIVE SURGERY TO RESECT RECTAL POLYP;  Surgeon: [name], MD;  Location: [facility] ORS;  Service: General;  Laterality: N/A;       Home Medications    Prior to Admission medications   Medication Sig Start Date End Date Taking? Authorizing Provider  albuterol (PROVENTIL HFA;VENTOLIN HFA) 108 (90 Base) MCG/ACT inhaler Inhale 2 puffs into the lungs every 4 (four) hours as needed for wheezing or shortness of breath. 08/07/15   Venetia Maxon Rama, MD  albuterol (PROVENTIL) (2.5 MG/3ML) 0.083% nebulizer solution Take 2.5 mg by nebulization every 6 (six) hours as needed for wheezing or shortness of breath.    Historical Provider, MD  aspirin EC 81 MG tablet Take 81 mg by mouth daily. Reported on 11/19/2015    Historical Provider, MD  carvedilol (COREG) 6.25 MG tablet Take 1 tablet (6.25 mg total) by mouth 2 (two) times daily with a meal.  09/23/15   Blanchie Dessert, MD  diphenoxylate-atropine (LOMOTIL) 2.5-0.025 MG tablet Take 1 tablet by mouth 4 (four) times daily as needed for diarrhea or loose stools. Patient not taking: Reported on 11/19/2015 09/25/15   Tanna Furry, MD  feeding supplement, ENSURE ENLIVE, (ENSURE ENLIVE) LIQD Take 237 mLs by mouth 2 (two) times daily between meals. Patient not taking: Reported on 11/19/2015 08/07/15   Venetia Maxon Rama, MD  oxyCODONE-acetaminophen (PERCOCET/ROXICET) 5-325 MG tablet Take 1 tablet by mouth every 6 (six) hours as needed for severe pain. Reported on 11/19/2015    Historical Provider, MD  predniSONE (DELTASONE) 20 MG tablet Take 3 tablets (60 mg total) by mouth daily. Patient not taking: Reported on 11/19/2015 10/27/15   Delice Bison Ward, DO    Family History Family History  Problem Relation Age of Onset  . Colon cancer Mother     Social History Social History  Substance Use Topics  . Smoking status: Current Every Day Smoker    Types: Cigarettes  . Smokeless tobacco: Former Systems developer  Types: Snuff, Chew     Comment: 05/27/2013 "haven't used chew or snuff in ~ 30 yr"  . Alcohol use 0.0 oz/week     Comment: occasionally     Allergies   Review of patient's allergies indicates no known allergies.   Review of Systems Review of Systems 10 Systems reviewed and are negative for acute change except as noted in the HPI.   Physical Exam Updated Vital Signs BP 102/78 (BP Location: Left Arm)   Pulse (!) 52   Temp 98.2 F (36.8 C) (Oral)   Resp 18   Ht 5\' 9"  (1.753 m)   Wt 90 lb (40.8 kg)   SpO2 100%   BMI 13.29 kg/m   Physical Exam  Constitutional: He is oriented to person, place, and time. No distress.  Thin, chronically ill appearing  HENT:  Head: Normocephalic and atraumatic.  Eyes: Conjunctivae are normal.  Neck: Neck supple.  Cardiovascular: Normal rate, regular rhythm and normal heart sounds.   No murmur heard. Pulmonary/Chest: Effort normal.  Coarse BS w/  scattered wheezes b/l  Musculoskeletal: He exhibits no edema.  Neurological: He is alert and oriented to person, place, and time.  Skin: Skin is warm and dry.  Psychiatric:  Occasionally tearful during conversation  Nursing note and vitals reviewed.    ED Treatments / Results  Labs (all labs ordered are listed, but only abnormal results are displayed) Labs Reviewed - No data to display  EKG  EKG Interpretation None       Radiology No results found.  Procedures Procedures (including critical care time)  Medications Ordered in ED Medications  albuterol (PROVENTIL HFA;VENTOLIN HFA) 108 (90 Base) MCG/ACT inhaler 2 puff (not administered)     Initial Impression / Assessment and Plan / ED Course  I have reviewed the triage vital signs and the nursing notes.   Clinical Course   Pt w/ h/o frequent ED visits presents for eval after IVC. He denied any SI or HI during my examination. I reviewed lab work from yesterday which was stable. I have discussed with TTS worker who evaluated him yesterday. She will contact the psychiatry practitioner on call tonight, who will evaluate patient. The patient's disposition is pending psychiatry team recommendations.  Final Clinical Impressions(s) / ED Diagnoses   Final diagnoses:  None    New Prescriptions New Prescriptions   No medications on file     Sharlett Iles, MD 01/12/16 [id number]

## 2016-01-12 NOTE — ED Notes (Signed)
SW informed this RN she consulted he and his public defender on resources available per this department. Pt nowhere to be found in dept. Pt no longer in room, in addition to Nurse, adult.

## 2016-01-12 NOTE — ED Notes (Signed)
Pt was IVC'ed by an intern [place] the courthouse because he states the pt is hallucinating. Per this RN, pt is at his baseline and is able to answer basic critical thinking questions. Pt is alert and oriented x 4. Pt states that he would like to be evaluated for gout related pain in his knees. Pt is calm and cooperative at this time.

## 2016-01-12 NOTE — ED Provider Notes (Signed)
Pt is aox3. Wants to leave No HI/SI. Pt is homeless -and he understands it night time and he is fine going back to his night time place. Will give shelter resources. Pt is not hallucinating, and he is aox3, answering all questions appropriately - no signs of dementia.    Damon Biles, MD 01/12/16 [phone]

## 2016-01-12 NOTE — BH Assessment (Signed)
Assessment completed. Consulted with Patriciaann Clan, PA-C who recommends overnight observation and AM psychiatric evaluation.   Rosalin Hawking, LCSW Therapeutic Triage Specialist [place] 01/12/2016 11:54 PM

## 2016-01-12 NOTE — BH Assessment (Addendum)
Assessment Note  [name] is an 70 y.o. male presenting to [facility] under IVC by Florence Canner [phone] who IVC'd patient last night for similar reasons.  IVC states:  Danger to self and other. Respondent has advanced dementia and is exhibiting hallucinations. [place] and marrying his girlfriend which he does not have. Which is known for smearing feces on himself. Today he has a smell of feces and was also seen wandering next to the road. Petitioner is very concerned for his well being and safety for his physical health and danger of being hit by a car.   Patient denies Si and history of attempts. Patient denies self-injurious behaviors. Patient denies HI and history of aggression. Patient states that he has guns in storage for hunting. Patient denies pending criminal charges or upcoming court dates. Patient denies AVH and does not appear to be responding to internal stimuli. Patient states that he does not want to go into a facility and he stays at the shelter when he feels that he needs a place to stay. Patient was seen earlier today in the ED and was evaluated by a CSW. Patient denies use of drugs and alcohol and UDS and BAL clear at time of assessment. 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 Patient states that he has a girlfriend and [place].   Patient is alert and oriented x4. Patient provided the same answers he provided in his assessment last night and appears to be coherent. Patient denies previous psychiatric history. Patient denies recent stressors and feeling depressed. Patient states that he does not sleep at night due to being homeless and he feels tired during the day. Patient states that he isolates himself from others "sometimes." Patient denies any questions or concerns. Patient requested a meal, inhaler, and pain medication.   Consulted with Patriciaann Clan,  PA-C who recommends overnight observation and evaluation by psychiatry in the morning.    Diagnosis: V60.0 Homelessness  Past Medical History:  Past Medical History:  Diagnosis Date  . Aspiration pneumonia ([place]) 10/07/2014  . ATHEROSCLEROSIS, AORTIC 12/24/2006   Qualifier: Diagnosis of  By: Hilma Favors  DO, Beth    . Atypical angina ([place]) 12/01/2014  . 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 BPH (benign prostatic hyperplasia)   . 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 Chronic atrial fibrillation ([place])   . Chronic chest pain   . Complete heart block ([place])    St. Jude pacemaker - followed by Dr. Lovena Le  . Depression   . Essential hypertension   . Fall at nursing home    December 2014  . Fracture femur, cervicotrochanteric ([place]) 05/27/2013  . Gastric ulcer   . GIB (gastrointestinal bleeding)   . Gout attack   . History of migraine   . HLD (hyperlipidemia)   . Malingering   . Non Hodgkin's lymphoma ([place])   . Tubular adenoma of colon     Past Surgical History:  Procedure Laterality Date  . APPENDECTOMY    . CHOLECYSTECTOMY    . COLONOSCOPY N/A 10/01/2014   Procedure: COLONOSCOPY;  Surgeon: Jerene Bears, MD;  [facility]COPY;  Service: Endoscopy;  Laterality: N/A;  . ESOPHAGOGASTRODUODENOSCOPY (EGD) WITH  PROPOFOL N/A 09/26/2014   Procedure: ESOPHAGOGASTRODUODENOSCOPY (EGD) WITH PROPOFOL;  Surgeon: Jerene Bears, MD;  Location: [facility] ENDOSCOPY;  Service: Endoscopy;  Laterality: N/A;  . FLEXIBLE SIGMOIDOSCOPY N/A 11/07/2014   Procedure: FLEXIBLE SIGMOIDOSCOPY;  Surgeon: Jerene Bears, MD;  Location[facility] ENDOSCOPY;  Service: Endoscopy;  Laterality: N/A;  . FLEXIBLE SIGMOIDOSCOPY N/A 07/30/2015   Procedure: FLEXIBLE SIGMOIDOSCOPY;  Surgeon: Doran Stabler, MD;  [facility];  Service: Endoscopy;  Laterality: N/A;  .  FLEXIBLE SIGMOIDOSCOPY N/A 08/03/2015   Procedure: FLEXIBLE SIGMOIDOSCOPY;  Surgeon: [name], MD;  [facility];  Service: General;  Laterality: N/A;  . GASTRIC RESECTION    . OPEN REDUCTION OF HIP Right 05/28/2013   Procedure: OPEN REDUCTION OF HIP;  Surgeon: Renette Butters, MD;  Location: [place];  Service: Orthopedics;  Laterality: Right;  . PACEMAKER GENERATOR CHANGE     Dr. Lovena Le  . PARTIAL COLECTOMY N/A 10/05/2014   Procedure: SUBTOTAL COLECTOMY WITH ILEORECTAL ANASTOMOSIS;  Surgeon: Donnie Mesa, MD;  Location: [place];  Service: General;  Laterality: N/A;  . TRANSANAL EXCISION OF RECTAL MASS N/A 08/03/2015   Procedure: TRANSANAL MINIMALLY INVASIVE SURGERY TO RESECT RECTAL POLYP;  Surgeon: [name], MD;  [facility];  Service: General;  Laterality: N/A;    Family History:  Family History  Problem Relation Age of Onset  . Colon cancer Mother     Social History:  reports that he has been smoking Cigarettes.  He has quit using smokeless tobacco. His smokeless tobacco use included Snuff and Chew. He reports that he drinks alcohol. He reports that he uses drugs, including Marijuana.  Additional Social History:  Alcohol / Drug Use Pain Medications: Denies Prescriptions: Denies Over the Counter: Denies History of alcohol / drug use?: No history of alcohol / drug abuse  CIWA: CIWA-Ar BP: 102/78 Pulse Rate: (!) 52 COWS:    Allergies: No Known Allergies  Home Medications:  (Not in a hospital admission)  OB/GYN Status:  No LMP for male patient.  General Assessment Data [facility]: [facility] TTS Assessment: In system Is this a Tele or Face-to-Face Assessment?: Face-to-Face Is this an Initial Assessment or a Re-assessment for this encounter?: Initial Assessment Marital status: Other (comment) (engaged) Is patient pregnant?: No Pregnancy Status: No Living Arrangements: Other (Comment) Can pt return to current living arrangement?: Yes Admission  Status: Involuntary Is patient capable of signing voluntary admission?: Yes Referral Source: Other (IVC)     Crisis Care Plan Living Arrangements: Other (Comment) Name of Psychiatrist: None Name of Therapist: None  Education Status Is patient currently in school?: No Highest grade of school patient has completed: 5th  Risk to self with the past 6 months Suicidal Ideation: No Has patient been a risk to self within the past 6 months prior to admission? : No Suicidal Intent: No Has patient had any suicidal intent within the past 6 months prior to admission? : No Is patient at risk for suicide?: No Suicidal Plan?: No Has patient had any suicidal plan within the past 6 months prior to admission? : No Access to Means: No What has been your use of drugs/alcohol within the last 12 months?: Denies Previous Attempts/Gestures: No How many times?: 0 Other Self Harm Risks: Denies Triggers for Past Attempts: None known Intentional Self Injurious Behavior: None Family Suicide History: No Recent stressful life event(s):  (Denies) Persecutory voices/beliefs?: No Depression:  (denies) Depression Symptoms: Insomnia, Isolating, Fatigue Substance abuse history and/or treatment for substance  abuse?: No Suicide prevention information given to non-admitted patients: Not applicable  Risk to Others within the past 6 months Homicidal Ideation: No Does patient have any lifetime risk of violence toward others beyond the six months prior to admission? : No Thoughts of Harm to Others: No Current Homicidal Intent: No Current Homicidal Plan: No Access to Homicidal Means: No Identified Victim: Denies History of harm to others?: No Assessment of Violence: None Noted Violent Behavior Description: Denies Does patient have access to weapons?: Yes (Comment) (guns for hunting "I don't got them to shoot nobody") Criminal Charges Pending?: No Does patient have a court date: No Is patient on probation?:  No  Psychosis Hallucinations: None noted Delusions: None noted  Mental Status Report Appearance/Hygiene: Body odor Eye Contact: Good Motor Activity: Unable to assess Speech: Logical/coherent Level of Consciousness: Alert Mood: Pleasant Affect: Appropriate to circumstance Anxiety Level: None Thought Processes: Coherent, Relevant Judgement: Partial Orientation: Person, Place, Time, Situation, Appropriate for developmental age Obsessive Compulsive Thoughts/Behaviors: None  Cognitive Functioning Concentration: Normal Memory: Recent Intact, Remote Intact IQ: Average Insight: Fair Impulse Control: Fair Appetite: Good Sleep: Decreased  ADLScreening [facility] Assessment Services) Patient's cognitive ability adequate to safely complete daily activities?: Yes Patient able to express need for assistance with ADLs?: Yes Independently performs ADLs?: Yes (appropriate for developmental age)  Prior Inpatient Therapy Prior Inpatient Therapy: No Prior Therapy Dates: N/A Prior Therapy Facilty/Provider(s): N/A Reason for Treatment: N/A  Prior Outpatient Therapy Prior Outpatient Therapy: No Prior Therapy Dates: N/A Prior Therapy Facilty/Provider(s): N/A Reason for Treatment: N/A Does patient have an ACCT team?: No Does patient have Intensive In-House Services?  : No Does patient have [facility] services? : No Does patient have P4CC services?: No  ADL Screening (condition at time of admission) Patient's cognitive ability adequate to safely complete daily activities?: Yes Is the patient deaf or have difficulty hearing?: Yes Does the patient have difficulty seeing, even when wearing glasses/contacts?: No Does the patient have difficulty concentrating, remembering, or making decisions?: No Patient able to express need for assistance with ADLs?: Yes Does the patient have difficulty dressing or bathing?: No Independently performs ADLs?: Yes (appropriate for developmental age) Does the patient  have difficulty walking or climbing stairs?: No Weakness of Legs: None Weakness of Arms/Hands: None  Home Assistive Devices/Equipment Home Assistive Devices/Equipment: None  Therapy Consults (therapy consults require a physician order) PT Evaluation Needed: No OT Evalulation Needed: No SLP Evaluation Needed: No Abuse/Neglect Assessment (Assessment to be complete while patient is alone) Physical Abuse: Denies Verbal Abuse: Denies Sexual Abuse: Denies Exploitation of patient/patient's resources: Denies Self-Neglect: Denies Values / Beliefs Cultural Requests During Hospitalization: None Spiritual Requests During Hospitalization: None Consults Spiritual Care Consult Needed: No Social Work Consult Needed: No Regulatory affairs officer (For Healthcare) Does patient have an advance directive?: No Would patient like information on creating an advanced directive?: No - patient declined information    Additional Information 1:1 In Past 12 Months?: No CIRT Risk: No Elopement Risk: No Does patient have medical clearance?: No     Disposition:  Disposition Initial Assessment Completed for this Encounter: Yes Disposition of Patient: Other dispositions (observe overnight per Patriciaann Clan PA-C) Other disposition(s): Other (Comment)  On Site Evaluation by:   Reviewed with Physician:    Pervis Macintyre 01/12/2016 11:53 PM

## 2016-01-13 ENCOUNTER — Emergency Department (HOSPITAL_COMMUNITY)
Admission: EM | Admit: 2016-01-13 | Discharge: 2016-01-13 | Disposition: A | Discharge status: Home / Self Care | Payer: Medicare Other | Source: Home / Self Care | Attending: Emergency Medicine | Admitting: Emergency Medicine

## 2016-01-13 ENCOUNTER — Encounter (HOSPITAL_COMMUNITY): Payer: Self-pay | Admitting: Registered Nurse

## 2016-01-13 ENCOUNTER — Encounter (HOSPITAL_COMMUNITY): Payer: Self-pay | Admitting: Emergency Medicine

## 2016-01-13 DIAGNOSIS — G8929 Other chronic pain: Secondary | ICD-10-CM

## 2016-01-13 DIAGNOSIS — F17210 Nicotine dependence, cigarettes, uncomplicated: Secondary | ICD-10-CM | POA: Insufficient documentation

## 2016-01-13 DIAGNOSIS — R072 Precordial pain: Secondary | ICD-10-CM | POA: Insufficient documentation

## 2016-01-13 DIAGNOSIS — I10 Essential (primary) hypertension: Secondary | ICD-10-CM

## 2016-01-13 DIAGNOSIS — Z79899 Other long term (current) drug therapy: Secondary | ICD-10-CM | POA: Insufficient documentation

## 2016-01-13 DIAGNOSIS — Z7982 Long term (current) use of aspirin: Secondary | ICD-10-CM

## 2016-01-13 DIAGNOSIS — R079 Chest pain, unspecified: Principal | ICD-10-CM

## 2016-01-13 MED ORDER — CARVEDILOL 6.25 MG PO TABS
6.2500 mg | ORAL_TABLET | Freq: Two times a day (BID) | ORAL | Status: DC
  Administered 2016-01-13: 6.25 mg via ORAL
  Filled 2016-01-13 (×3): qty 1

## 2016-01-13 MED ORDER — LORAZEPAM 1 MG PO TABS: 1.0000 mg | ORAL_TABLET | Freq: Three times a day (TID) | ORAL | Status: DC | PRN

## 2016-01-13 MED ORDER — ONDANSETRON HCL 4 MG PO TABS: 4.0000 mg | ORAL_TABLET | Freq: Three times a day (TID) | ORAL | Status: DC | PRN

## 2016-01-13 MED ORDER — NICOTINE 21 MG/24HR TD PT24
21.0000 mg | MEDICATED_PATCH | Freq: Every day | TRANSDERMAL | Status: DC
  Administered 2016-01-13: 21 mg via TRANSDERMAL
  Filled 2016-01-13: qty 1

## 2016-01-13 MED ORDER — ALUM & MAG HYDROXIDE-SIMETH 200-200-20 MG/5ML PO SUSP: 30.0000 mL | ORAL | Status: DC | PRN

## 2016-01-13 MED ORDER — CEPHALEXIN 500 MG PO CAPS
500.0000 mg | ORAL_CAPSULE | Freq: Once | ORAL | Status: AC
  Administered 2016-01-13: 500 mg via ORAL
  Filled 2016-01-13: qty 1

## 2016-01-13 MED ORDER — CEPHALEXIN 500 MG PO CAPS: 500.0000 mg | ORAL_CAPSULE | Freq: Two times a day (BID) | ORAL | 0 refills | Status: AC

## 2016-01-13 MED ORDER — ALLOPURINOL 100 MG PO TABS
100.0000 mg | ORAL_TABLET | Freq: Every day | ORAL | Status: DC
  Administered 2016-01-13: 100 mg via ORAL
  Filled 2016-01-13: qty 1

## 2016-01-13 MED ORDER — SERTRALINE HCL 50 MG PO TABS
25.0000 mg | ORAL_TABLET | Freq: Every day | ORAL | Status: DC
  Administered 2016-01-13: 25 mg via ORAL
  Filled 2016-01-13: qty 1

## 2016-01-13 MED ORDER — CITALOPRAM HYDROBROMIDE 10 MG PO TABS
10.0000 mg | ORAL_TABLET | Freq: Every day | ORAL | Status: DC
  Filled 2016-01-13: qty 1

## 2016-01-13 MED ORDER — ACETAMINOPHEN 325 MG PO TABS
650.0000 mg | ORAL_TABLET | ORAL | Status: DC | PRN
  Administered 2016-01-13 (×3): 650 mg via ORAL
  Filled 2016-01-13 (×3): qty 2

## 2016-01-13 MED ORDER — ALLOPURINOL 100 MG PO TABS: 100.0000 mg | ORAL_TABLET | Freq: Every day | ORAL | 0 refills | Status: AC

## 2016-01-13 MED ORDER — ASPIRIN EC 81 MG PO TBEC
81.0000 mg | DELAYED_RELEASE_TABLET | Freq: Every day | ORAL | Status: DC
  Administered 2016-01-13: 81 mg via ORAL
  Filled 2016-01-13: qty 1

## 2016-01-13 MED ORDER — SERTRALINE HCL 25 MG PO TABS: 25.0000 mg | ORAL_TABLET | Freq: Every day | ORAL | 0 refills | Status: AC

## 2016-01-13 NOTE — ED Notes (Signed)
Patient noted sleeping in room. No complaints, stable, in no acute distress. Q15 minute rounds and monitoring via [employer] to continue.  

## 2016-01-13 NOTE — ED Notes (Signed)
Up to the bathroom 

## 2016-01-13 NOTE — ED Notes (Signed)
Patient noted in room. Stable, in no acute distress. Q15 minute rounds and monitoring via [employer] to continue.

## 2016-01-13 NOTE — ED Provider Notes (Signed)
[facility] DEPT Provider Note   CSN: [id number] Arrival date & time: 01/13/16  1631  First Provider Contact:  First MD Initiated Contact with Patient 01/13/16 1708        History   Chief Complaint Chief Complaint  Patient presents with  . Chest Pain    HPI [name] is a 70 y.o. male.  The history is provided by the patient.  Chest Pain   This is a chronic problem. The current episode started more than 1 week ago. The problem occurs constantly. The problem has not changed since onset.The pain is present in the substernal region. The pain is mild. The pain does not radiate. Duration of episode(s) is 6 months. Pertinent negatives include no abdominal pain, no exertional chest pressure, no fever and no vomiting. He has tried nothing for the symptoms. The treatment provided no relief. Risk factors: chronic chest pain, cardiomyopathy.    Past Medical History:  Diagnosis Date  . Aspiration pneumonia ([place]) 10/07/2014  . ATHEROSCLEROSIS, AORTIC 12/24/2006   Qualifier: Diagnosis of  By: Hilma Favors  DO, Beth    . Atypical angina ([place]) 12/01/2014  . 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 BPH (benign prostatic hyperplasia)   . 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 Chronic atrial fibrillation ([place])   . Chronic chest pain   . Complete heart block ([place])    St. Jude pacemaker - followed by Dr. Lovena Le  . Depression   . Essential hypertension   . Fall at nursing home    December 2014  . Fracture femur, cervicotrochanteric ([place]) 05/27/2013  . Gastric ulcer   . GIB (gastrointestinal bleeding)   . Gout attack   . History of migraine   . HLD (hyperlipidemia)   . Malingering   . Non Hodgkin's lymphoma ([place])   . Tubular adenoma of colon     Patient Active Problem List   Diagnosis Date Noted  . Ileus, postoperative 08/05/2015   . Chest pain 08/05/2015  . COPD exacerbation ([place]) 08/04/2015  . Homelessness 08/04/2015  . Dysplastic rectal polyp   . Bleeding rectal polyp s/p partial proctectomy 08/03/2015   . Chronic abdominal pain   . 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 Pressure ulcer 11/16/2014  . Hx of colectomy   . History of adenomatous polyp of colon   . Rectal mass   . Essential hypertension   . Rectal bleeding   . Acute blood loss anemia   . Delirium   . Protein-calorie malnutrition, severe ([place]) 10/08/2014  . Hematochezia   . Sigmoid & cecal bleeding colon masses s/p abdominal colectomy 10/05/2014   . HLD (hyperlipidemia) 09/30/2014  . Depression 09/30/2014  . Abdominal pain 09/30/2014  . BPH (benign prostatic hyperplasia) 09/29/2014  . Thrombocytopenia ([place]) 12/24/2006  . DEPRESSION, MAJOR, MODERATE 12/24/2006  . LEARNING DISABILITY 12/24/2006  . 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 CARDIOMYOPATHY 12/24/2006  . BLOCK, AV, COMPLETE 12/24/2006  . ATHEROSCLEROSIS, AORTIC 12/24/2006  . Atrial fibrillation ([place]) 12/24/2006  . History of other specified conditions presenting hazards to health 12/24/2006  . CHOLECYSTECTOMY, HX OF 12/24/2006    Past Surgical History:  Procedure Laterality Date  . APPENDECTOMY    . CHOLECYSTECTOMY    . COLONOSCOPY N/A 10/01/2014   Procedure: COLONOSCOPY;  Surgeon: Jerene Bears,  MD;  Location: [facility] ENDOSCOPY;  Service: Endoscopy;  Laterality: N/A;  . ESOPHAGOGASTRODUODENOSCOPY (EGD) WITH PROPOFOL N/A 09/26/2014   Procedure: ESOPHAGOGASTRODUODENOSCOPY (EGD) WITH PROPOFOL;  Surgeon: Jerene Bears, MD;  Location[facility] ENDOSCOPY;  Service: Endoscopy;  Laterality: N/A;  . FLEXIBLE SIGMOIDOSCOPY N/A 11/07/2014   Procedure: FLEXIBLE SIGMOIDOSCOPY;  Surgeon: Jerene Bears, MD;  Location: [facility] ENDOSCOPY;  Service: Endoscopy;  Laterality: N/A;  . FLEXIBLE SIGMOIDOSCOPY N/A 07/30/2015   Procedure: FLEXIBLE SIGMOIDOSCOPY;  Surgeon: Doran Stabler, MD;  [facility];  Service: Endoscopy;  Laterality: N/A;  . FLEXIBLE SIGMOIDOSCOPY N/A 08/03/2015   Procedure: FLEXIBLE SIGMOIDOSCOPY;  Surgeon: [name], MD;  [facility]S;  Service: General;  Laterality: N/A;  . GASTRIC RESECTION    . OPEN REDUCTION OF HIP Right 05/28/2013   Procedure: OPEN REDUCTION OF HIP;  Surgeon: Renette Butters, MD;  Location: [place];  Service: Orthopedics;  Laterality: Right;  . PACEMAKER GENERATOR CHANGE     Dr. Lovena Le  . PARTIAL COLECTOMY N/A 10/05/2014   Procedure: SUBTOTAL COLECTOMY WITH ILEORECTAL ANASTOMOSIS;  Surgeon: Donnie Mesa, MD;  Location: [place];  Service: General;  Laterality: N/A;  . TRANSANAL EXCISION OF RECTAL MASS N/A 08/03/2015   Procedure: TRANSANAL MINIMALLY INVASIVE SURGERY TO RESECT RECTAL POLYP;  Surgeon: [name], MD;  [facility]S;  Service: General;  Laterality: N/A;       Home Medications    Prior to Admission medications   Medication Sig Start Date End Date Taking? Authorizing Provider  albuterol (PROVENTIL HFA;VENTOLIN HFA) 108 (90 Base) MCG/ACT inhaler Inhale 2 puffs into the lungs every 4 (four) hours as needed for wheezing or shortness of breath. 08/07/15   Venetia Maxon Rama, MD  albuterol (PROVENTIL) (2.5 MG/3ML) 0.083% nebulizer solution Take 2.5 mg by nebulization every 6 (six) hours as needed for wheezing or shortness of breath.    Historical Provider, MD  allopurinol (ZYLOPRIM) 100 MG tablet Take 1 tablet (100 mg total) by mouth daily. 01/13/16   Shuvon B Rankin, NP  aspirin EC 81 MG tablet Take 81 mg by mouth daily. Reported on 11/19/2015    Historical Provider, MD  carvedilol (COREG) 6.25 MG tablet Take 1 tablet (6.25 mg total) by mouth 2 (two) times daily with a meal. 09/23/15   Blanchie Dessert, MD  cephALEXin (KEFLEX) 500 MG capsule Take 1 capsule (500 mg total) by mouth 2 (two) times daily. 01/13/16 01/27/16  Shuvon B Rankin, NP  diphenoxylate-atropine (LOMOTIL) 2.5-0.025 MG tablet Take 1 tablet by mouth 4 (four)  times daily as needed for diarrhea or loose stools. Patient not taking: Reported on 11/19/2015 09/25/15   Tanna Furry, MD  feeding supplement, ENSURE ENLIVE, (ENSURE ENLIVE) LIQD Take 237 mLs by mouth 2 (two) times daily between meals. Patient not taking: Reported on 11/19/2015 08/07/15   Venetia Maxon Rama, MD  oxyCODONE-acetaminophen (PERCOCET/ROXICET) 5-325 MG tablet Take 1 tablet by mouth every 6 (six) hours as needed for severe pain. Reported on 11/19/2015    Historical Provider, MD  predniSONE (DELTASONE) 20 MG tablet Take 3 tablets (60 mg total) by mouth daily. Patient not taking: Reported on 11/19/2015 10/27/15   Delice Bison Ward, DO  sertraline (ZOLOFT) 25 MG tablet Take 1 tablet (25 mg total) by mouth daily. 01/13/16   Shuvon B Rankin, NP    Family History Family History  Problem Relation Age of Onset  . Colon cancer Mother     Social History Social History  Substance Use Topics  .  Smoking status: Current Every Day Smoker    Types: Cigarettes  . Smokeless tobacco: Former Systems developer    Types: Snuff, Chew     Comment: 05/27/2013 "haven't used chew or snuff in ~ 30 yr"  . Alcohol use 0.0 oz/week     Comment: occasionally     Allergies   Review of patient's allergies indicates no known allergies.   Review of Systems Review of Systems  Constitutional: Negative for fever.  Cardiovascular: Positive for chest pain.  Gastrointestinal: Negative for abdominal pain and vomiting.  All other systems reviewed and are negative.    Physical Exam Updated Vital Signs BP 114/78 (BP Location: Right Arm)   Pulse 92   Temp 98.1 F (36.7 C) (Oral)   Resp 18   SpO2 100%   Physical Exam  Constitutional: He is oriented to person, place, and time. He appears well-developed and well-nourished. No distress.  Chronically ill appearing  HENT:  Head: Normocephalic and atraumatic.  Eyes: Conjunctivae are normal.  Neck: Neck supple. No tracheal deviation present.  Cardiovascular: Normal rate and regular  rhythm.   Pulmonary/Chest: Effort normal. No respiratory distress.  Abdominal: Soft. He exhibits no distension.  Neurological: He is alert and oriented to person, place, and time.  Skin: Skin is warm and dry.  Psychiatric: He has a normal mood and affect.     ED Treatments / Results  Labs (all labs ordered are listed, but only abnormal results are displayed) Labs Reviewed - No data to display  EKG  EKG Interpretation  Date/Time:  Saturday January 13 2016 17:05:34 EDT Ventricular Rate:  105 PR Interval:    QRS Duration: 105 QT Interval:  365 QTC Calculation: 483 R Axis:   -105 Text Interpretation:  Atrial fibrillation Left anterior fascicular block Anterolateral infarct, old No significant change since last tracing Confirmed by Whitley Patchen MD, Esaiah Wanless [id number]) on 01/13/2016 5:09:45 PM       Radiology No results found.  Procedures Procedures (including critical care time)  Medications Ordered in ED Medications - No data to display   Initial Impression / Assessment and Plan / ED Course  I have reviewed the triage vital signs and the nursing notes.  Pertinent labs & imaging results that were available during my care of the patient were reviewed by me and considered in my medical decision making (see chart for details).  Clinical Course    70 y.o. male presents with Chronic chest pain ongoing left leg pain. He was given antibiotics for cellulitis just prior to leaving from involuntary commitment today after being cleared by psych. Plan to follow up with PCP as needed and return precautions discussed for worsening or new concerning symptoms.   Final Clinical Impressions(s) / ED Diagnoses   Final diagnoses:  Chronic chest pain    New Prescriptions New Prescriptions   No medications on file     Leo Grosser, MD 01/13/16 1725

## 2016-01-13 NOTE — ED Notes (Signed)
Pt assisted to dc area by mHt by wheel chair.  Belongings returned after leaving the area. Buss pass given

## 2016-01-13 NOTE — ED Notes (Signed)
Pt. C/o "tightness, requesting inhaler.

## 2016-01-13 NOTE — ED Triage Notes (Signed)
Pt complaint of left chest sharpness onset 40 minutes a go. Pt denies associated symptoms.

## 2016-01-13 NOTE — ED Provider Notes (Signed)
Called to the patient's room for left leg pain and redness that has been ongoing for several days. Patient reports being homeless and being bit by multiple ants. Pain and redness have been progressive since. Patient's a smoker and daily alcohol consumer. Denies any other physical complaints at this time.  Vitals:   01/13/16 0608 01/13/16 0850  BP: 121/81 116/62  Pulse: 83 99  Resp: 16   Temp: 98 F (36.7 C)     On exam patient has numerous pustules on the left lower extremity consistent with an bites with erythema and pain on light touch to the left foot up into the mid calf region. Heart lung and abdominal exam unremarkable. Patient has intact pulses in bilateral lower extremities.  Patient denies any no known allergies.  Presentation consistent with cellulitis.  Will require antibiotics. Patient has been cleared by psychiatry for discharge. Will consult social work for assistance in providing the patient with antibiotics for his cellulitis.        Fatima Blank, MD 01/13/16 2044

## 2016-01-13 NOTE — [facility] Suicide Risk Assessment (Cosign Needed)
Suicide Risk Assessment  Discharge Assessment   [facility] Discharge Suicide Risk Assessment   Principal Problem: Major depressive disorder, single episode, moderate ([place]) Discharge Diagnoses:  Patient Active Problem List   Diagnosis Date Noted  . Ileus, postoperative [K91.3] 08/05/2015  . Chest pain [R07.9] 08/05/2015  . COPD exacerbation ([place]) [J44.1] 08/04/2015  . Homelessness [Z59.0] 08/04/2015  . Dysplastic rectal polyp [K62.0]   . Bleeding rectal polyp s/p partial proctectomy 08/03/2015 [D12.8]   . Chronic abdominal pain [R10.9, G89.29]   . 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 Pressure ulcer [L89.90] 11/16/2014  . Hx of colectomy [Z90.49]   . History of adenomatous polyp of colon [Z86.010]   . Rectal mass [R19.8]   . Essential hypertension [I10]   . Rectal bleeding [K62.5]   . Acute blood loss anemia [D62]   . Delirium [R41.0]   . Protein-calorie malnutrition, severe ([place]) [E43] 10/08/2014  . Hematochezia [K92.1]   . Sigmoid & cecal bleeding colon masses s/p abdominal colectomy 10/05/2014 [D12.5]   . HLD (hyperlipidemia) [E78.5] 09/30/2014  . Depression [F32.9] 09/30/2014  . Abdominal pain [R10.9] 09/30/2014  . BPH (benign prostatic hyperplasia) [N40.0] 09/29/2014  . Thrombocytopenia ([place]) [D69.6] 12/24/2006  . DEPRESSION, MAJOR, MODERATE [F32.1] 12/24/2006  . LEARNING DISABILITY [F81.89] 12/24/2006  . 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 CARDIOMYOPATHY [I42.8] 12/24/2006  . 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 ATHEROSCLEROSIS, AORTIC [I70.0] 12/24/2006  . Atrial fibrillation ([place]) [I48.91] 12/24/2006  . History of other specified conditions presenting hazards to health [Z91.89] 12/24/2006  . CHOLECYSTECTOMY, HX OF [Z90.89] 12/24/2006    Total Time spent with patient: 20 minutes  Musculoskeletal: Strength & Muscle Tone: within normal limits Gait & Station: normal Patient leans: N/A  Psychiatric Specialty Exam: Physical Exam   Constitutional: He is oriented to person, place, and time.  Neck: Normal range of motion.  Respiratory: Effort normal.  Musculoskeletal: He exhibits edema (left foot).  Neurological: He is alert and oriented to person, place, and time.  Skin: There is erythema.  Left leg covered in multiple scabbed lesions.  Patient states that he was bitten buy ants.  Also swollen, inflamed foot; states that he has gout.  Cellulitis.    Psychiatric: He has a normal mood and affect. His speech is normal. He is not actively hallucinating. Thought content is not paranoid. Cognition and memory are normal. He expresses impulsivity. He expresses no homicidal and no suicidal ideation.    ROS  Blood pressure 116/62, pulse 99, temperature 98 F (36.7 C), temperature source Oral, resp. rate 16, height 5\' 9"  (1.753 m), weight 40.8 kg (90 lb), SpO2 99 %.Body mass index is 13.29 kg/m.  General Appearance: Disheveled  Eye Contact:  Good  Speech:  Clear and Coherent and Normal Rate  Volume:  Normal  Mood:  "I feel good"  Affect:  Congruent  Thought Process:  Coherent and Goal Directed  Orientation:  Full (Time, Place, and Person)  Thought Content:  Logical  Suicidal Thoughts:  No  Homicidal Thoughts:  No  Memory:  Immediate;   Good Recent;   Good Remote;   Good  Judgement:  Fair  Insight:  Present  Psychomotor Activity:  Normal  Concentration:  Concentration: Fair and Attention Span: Fair  Recall:  Good  Fund of Knowledge:  Fair  Language:  Good  Akathisia:  No  Handed:  Right  AIMS (if indicated):     Assets:  Communication Skills Desire for Improvement  ADL's:  Intact  Cognition:  WNL  Sleep:      Mental Status Per Nursing Assessment::   On Admission:     Demographic Factors:  Male, Age 70 or older and Caucasian  Loss Factors: Financial problems/change in socioeconomic status  Historical Factors: NA and Denies prior history of mental illness and substance abuse  Risk Reduction Factors:    Religious beliefs about death  Continued Clinical Symptoms:  Medical Diagnoses and Treatments/Surgeries  Cognitive Features That Contribute To Risk:  None    Suicide Risk:  Minimal: No identifiable suicidal ideation.  Patients presenting with no risk factors but with morbid ruminations; may be classified as minimal risk based on the severity of the depressive symptoms    Plan Of Care/Follow-up recommendations:  Activity:  As tolerated Diet:  Low Sodium  Rankin, Shuvon, NP 01/13/2016, 11:37 AM

## 2016-01-13 NOTE — ED Notes (Addendum)
Warm water soak for foot, pt does not want to wait for dc and would rather leave now. IVC has been recinded

## 2016-01-13 NOTE — Clinical Social Work Note (Signed)
CSW provided paperwork per psychiatry request to rescind patient IVC status.  Patient is being discharged.  CSW met with patient who reported that he has a place to go today.  CSW provided RN with buss passes for discharge today.  Dede Query, LCSW [place] Worker - Weekend Coverage cell #: [phone]

## 2016-01-13 NOTE — ED Notes (Signed)
Patient ambulatory stating that he was leaving.  Patient walked out of room and down hall.

## 2016-01-13 NOTE — ED Notes (Signed)
Pt called to triage room without response. 

## 2016-01-13 NOTE — ED Notes (Signed)
EKG placed related to pt complaint of pain. Future orders deferred to [place] related to pt ED Care Plan.

## 2016-01-13 NOTE — ED Notes (Signed)
Pt reports that he will be able to get his prescription filled for the antibiotic

## 2016-01-13 NOTE — ED Notes (Signed)
EDP into see 

## 2016-01-13 NOTE — ED Notes (Signed)
Pt given bus pass and iced coke at discharge.

## 2016-01-13 NOTE — ED Notes (Signed)
Written dc instructions and prescriptions reviewed with patient.  Pt encouraged to take medications as directed and finish the complete course of antibiotics.  Pt encouraged to follow up with the clinic next week for re-eval of his foot and to seek treatment for signs of infection, or suicidal/homicidal thoughts or urges or AVH.  Pt verbalized understanding.

## 2016-01-13 NOTE — ED Notes (Signed)
Bed in low position, pt. Given urinal. Pt. Instructed to push nurse call for assistance to ambulate.

## 2016-01-13 NOTE — Consult Note (Signed)
[facility] Psychiat[facility] Consult   Reason for Consult:  Hallucinating  Referring Physician:  EDP Patient Identification: [name] MRN:  [id number] Principal Diagnosis: Major depressive disorder, single episode, moderate ([place]) Diagnosis:   Patient Active Problem List   Diagnosis Date Noted  . Ileus, postoperative [K91.3] 08/05/2015  . Chest pain [R07.9] 08/05/2015  . COPD exacerbation ([place]) [J44.1] 08/04/2015  . Homelessness [Z59.0] 08/04/2015  . Dysplastic rectal polyp [K62.0]   . Bleeding rectal polyp s/p partial proctectomy 08/03/2015 [D12.8]   . Chronic abdominal pain [R10.9, G89.29]   . 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 Pressure ulcer [L89.90] 11/16/2014  . Hx of colectomy [Z90.49]   . History of adenomatous polyp of colon [Z86.010]   . Rectal mass [R19.8]   . Essential hypertension [I10]   . Rectal bleeding [K62.5]   . Acute blood loss anemia [D62]   . Delirium [R41.0]   . Protein-calorie malnutrition, severe ([place]) [E43] 10/08/2014  . Hematochezia [K92.1]   . Sigmoid & cecal bleeding colon masses s/p abdominal colectomy 10/05/2014 [D12.5]   . HLD (hyperlipidemia) [E78.5] 09/30/2014  . Depression [F32.9] 09/30/2014  . Abdominal pain [R10.9] 09/30/2014  . BPH (benign prostatic hyperplasia) [N40.0] 09/29/2014  . Thrombocytopenia ([place]) [D69.6] 12/24/2006  . DEPRESSION, MAJOR, MODERATE [F32.1] 12/24/2006  . LEARNING DISABILITY [F81.89] 12/24/2006  . 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 CARDIOMYOPATHY [I42.8] 12/24/2006  . 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 ATHEROSCLEROSIS, AORTIC [I70.0] 12/24/2006  . Atrial fibrillation ([place]) [I48.91] 12/24/2006  . History of other specified conditions presenting hazards to health [Z91.89] 12/24/2006  . CHOLECYSTECTOMY, HX OF [Z90.89] 12/24/2006    Total Time spent with patient: 45 minutes  Subjective:   [name] is a 70 y.o. male patient admitted [place] under IVC stating that he  dangerous to himself and hallucinating about getting married.  HPI:  [name] 70 y.o. male patient seen by this provider and chart reviewed .  On evaluation:  BRADLEY BOSTELMAN reports that he is feeling well except for his leg.  Patient states that he has gout and has been bitten all over by ants.  Patient is pleasant; oriented to person, place, time, and situation.  He denies suicidal/homicidal ideation, paranoia, and psychosis.  "I do talk to my self sometimes; ain't nothing wrong with that; but I don't hear no voices and anxiety.  He does states that he has depression from timet to time "but who doesn't."    Patient does state that he is going to get married next weekend to a lady who lives in a nursing home.  Reports that he is homeless and has been staying at the [facility] and goes to the [facility] for assistance.  Patient was seen in June making similar complaints that he was going to get married.  Patient has multiple medical issue and multiple presentation to emergency room.      Past Psychiatric History: Patient denies history of mental illness  Risk to Self: Suicidal Ideation: No Suicidal Intent: No Is patient at risk for suicide?: No Suicidal Plan?: No Access to Means: No What has been your use of drugs/alcohol within the last 12 months?: Denies How many times?: 0 Other Self Harm Risks: Denies Triggers for Past Attempts: None known Intentional Self Injurious Behavior: None Risk to Others: Homicidal Ideation: No Thoughts of Harm to Others: No Current Homicidal Intent: No Current Homicidal Plan: No Access to Homicidal Means: No Identified Victim: Denies History  of harm to others?: No Assessment of Violence: None Noted Violent Behavior Description: Denies Does patient have access to weapons?: Yes (Comment) (guns for hunting "I don't got them to shoot nobody") Criminal Charges Pending?: No Does patient have a court date: No Prior Inpatient Therapy: Prior Inpatient Therapy:  No Prior Therapy Dates: N/A Prior Therapy Facilty/Provider(s): N/A Reason for Treatment: N/A Prior Outpatient Therapy: Prior Outpatient Therapy: No Prior Therapy Dates: N/A Prior Therapy Facilty/Provider(s): N/A Reason for Treatment: N/A Does patient have an ACCT team?: No Does patient have Intensive In-House Services?  : No Does patient have [facility] services? : No Does patient have P4CC services?: No  Past Medical History:  Past Medical History:  Diagnosis Date  . Aspiration pneumonia ([place]) 10/07/2014  . ATHEROSCLEROSIS, AORTIC 12/24/2006   Qualifier: Diagnosis of  By: Hilma Favors  DO, Beth    . Atypical angina ([place]) 12/01/2014  . 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 BPH (benign prostatic hyperplasia)   . 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 Chronic atrial fibrillation ([place])   . Chronic chest pain   . Complete heart block ([place])    St. Jude pacemaker - followed by Dr. Lovena Le  . Depression   . Essential hypertension   . Fall at nursing home    December 2014  . Fracture femur, cervicotrochanteric ([place]) 05/27/2013  . Gastric ulcer   . GIB (gastrointestinal bleeding)   . Gout attack   . History of migraine   . HLD (hyperlipidemia)   . Malingering   . Non Hodgkin's lymphoma ([place])   . Tubular adenoma of colon     Past Surgical History:  Procedure Laterality Date  . APPENDECTOMY    . CHOLECYSTECTOMY    . COLONOSCOPY N/A 10/01/2014   Procedure: COLONOSCOPY;  Surgeon: Jerene Bears, MD;  Location[facility] ENDOSCOPY;  Service: Endoscopy;  Laterality: N/A;  . ESOPHAGOGASTRODUODENOSCOPY (EGD) WITH PROPOFOL N/A 09/26/2014   Procedure: ESOPHAGOGASTRODUODENOSCOPY (EGD) WITH PROPOFOL;  Surgeon: Jerene Bears, MD;  Location: [facility] ENDOSCOPY;  Service: Endoscopy;  Laterality: N/A;  . FLEXIBLE SIGMOIDOSCOPY N/A 11/07/2014   Procedure:  FLEXIBLE SIGMOIDOSCOPY;  Surgeon: Jerene Bears, MD;  Location: [facility] ENDOSCOPY;  Service: Endoscopy;  Laterality: N/A;  . FLEXIBLE SIGMOIDOSCOPY N/A 07/30/2015   Procedure: FLEXIBLE SIGMOIDOSCOPY;  Surgeon: Doran Stabler, MD;  Location: [facility] ENDOSCOPY;  Service: Endoscopy;  Laterality: N/A;  . FLEXIBLE SIGMOIDOSCOPY N/A 08/03/2015   Procedure: FLEXIBLE SIGMOIDOSCOPY;  Surgeon: [name], MD;  Location: [facility] ORS;  Service: General;  Laterality: N/A;  . GASTRIC RESECTION    . OPEN REDUCTION OF HIP Right 05/28/2013   Procedure: OPEN REDUCTION OF HIP;  Surgeon: Renette Butters, MD;  Location: [facility];  Service: Orthopedics;  Laterality: Right;  . PACEMAKER GENERATOR CHANGE     Dr. Lovena Le  . PARTIAL COLECTOMY N/A 10/05/2014   Procedure: SUBTOTAL COLECTOMY WITH ILEORECTAL ANASTOMOSIS;  Surgeon: Donnie Mesa, MD;  Location: [place];  Service: General;  Laterality: N/A;  . TRANSANAL EXCISION OF RECTAL MASS N/A 08/03/2015   Procedure: TRANSANAL MINIMALLY INVASIVE SURGERY TO RESECT RECTAL POLYP;  Surgeon: [name], MD;  Location: [facility] ORS;  Service: General;  Laterality: N/A;   Family History:  Family History  Problem Relation Age of Onset  . Colon cancer Mother    Family Psychiatric  History: Denies Social History:  History  Alcohol Use  . 0.0 oz/week    Comment: occasionally     History  Drug Use  . Types: Marijuana    Comment: "a joint a month"    Social History   Social History  . Marital status: Divorced    Spouse name: N/A  . Number of children: N/A  . Years of education: N/A   Social History Main Topics  . Smoking status: Current Every Day Smoker    Types: Cigarettes  . Smokeless tobacco: Former Systems developer    Types: Snuff, Chew     Comment: 05/27/2013 "haven't used chew or snuff in ~ 30 yr"  . Alcohol use 0.0 oz/week     Comment: occasionally  . Drug use:     Types: Marijuana     Comment: "a joint a month"  . Sexual activity: No   Other Topics Concern  . None   Social  History Narrative  . None   Additional Social History:    Allergies:  No Known Allergies  Labs:  Results for orders placed or performed during the hospital encounter of 01/11/16 (from the past 48 hour(s))  Comprehensive metabolic panel     Status: Abnormal   Collection Time: 01/11/16  8:19 PM  Result Value Ref Range   Sodium 140 135 - 145 mmol/L   Potassium 4.4 3.5 - 5.1 mmol/L   Chloride 106 101 - 111 mmol/L   CO2 25 22 - 32 mmol/L   Glucose, Bld 108 (H) 65 - 99 mg/dL   BUN 22 (H) 6 - 20 mg/dL   Creatinine, Ser 0.89 0.61 - 1.24 mg/dL   Calcium 8.9 8.9 - 10.3 mg/dL   Total Protein 7.2 6.5 - 8.1 g/dL   Albumin 3.6 3.5 - 5.0 g/dL   AST 22 15 - 41 U/L   ALT 13 (L) 17 - 63 U/L   Alkaline Phosphatase 110 38 - 126 U/L   Total Bilirubin 0.7 0.3 - 1.2 mg/dL   GFR calc non Af Amer >60 >60 mL/min   GFR calc Af Amer >60 >60 mL/min    Comment: (NOTE) The eGFR has been calculated using the CKD EPI equation. This calculation has not been validated in all clinical situations. eGFR's persistently <60 mL/min signify possible Chronic Kidney Disease.    Anion gap 9 5 - 15  cbc     Status: Abnormal   Collection Time: 01/11/16  8:19 PM  Result Value Ref Range   WBC 9.7 4.0 - 10.5 K/uL   RBC 3.72 (L) 4.22 - 5.81 MIL/uL   Hemoglobin 11.0 (L) 13.0 - 17.0 g/dL   HCT 35.4 (L) 39.0 - 52.0 %   MCV 95.2 78.0 - 100.0 fL   MCH 29.6 26.0 - 34.0 pg   MCHC 31.1 30.0 - 36.0 g/dL   RDW 15.8 (H) 11.5 - 15.5 %   Platelets 197 150 - 400 K/uL  Ethanol     Status: None   Collection Time: 01/11/16  8:20 PM  Result Value Ref Range   Alcohol, Ethyl (B) <5 <5 mg/dL    Comment:        LOWEST DETECTABLE LIMIT FOR SERUM ALCOHOL IS 5 mg/dL FOR MEDICAL PURPOSES ONLY   Salicylate level     Status: None   Collection Time: 01/11/16  8:20 PM  Result Value Ref Range   Salicylate Lvl <4.7 2.8 - 30.0 mg/dL  Acetaminophen level     Status: None  Collection Time: 01/11/16  8:20 PM  Result Value Ref Range    Acetaminophen (Tylenol), Serum 18 10 - 30 ug/mL    Comment:        THERAPEUTIC CONCENTRATIONS VARY SIGNIFICANTLY. A RANGE OF 10-30 ug/mL MAY BE AN EFFECTIVE CONCENTRATION FOR MANY PATIENTS. HOWEVER, SOME ARE BEST TREATED AT CONCENTRATIONS OUTSIDE THIS RANGE. ACETAMINOPHEN CONCENTRATIONS >150 ug/mL AT 4 HOURS AFTER INGESTION AND >50 ug/mL AT 12 HOURS AFTER INGESTION ARE OFTEN ASSOCIATED WITH TOXIC REACTIONS.   Rapid urine drug screen (hospital performed)     Status: None   Collection Time: 01/11/16  9:46 PM  Result Value Ref Range   Opiates NONE DETECTED NONE DETECTED   Cocaine NONE DETECTED NONE DETECTED   Benzodiazepines NONE DETECTED NONE DETECTED   Amphetamines NONE DETECTED NONE DETECTED   Tetrahydrocannabinol NONE DETECTED NONE DETECTED   Barbiturates NONE DETECTED NONE DETECTED    Comment:        DRUG SCREEN FOR MEDICAL PURPOSES ONLY.  IF CONFIRMATION IS NEEDED FOR ANY PURPOSE, NOTIFY LAB WITHIN 5 DAYS.        LOWEST DETECTABLE LIMITS FOR URINE DRUG SCREEN Drug Class       Cutoff (ng/mL) Amphetamine      1000 Barbiturate      200 Benzodiazepine   237 Tricyclics       628 Opiates          300 Cocaine          300 THC              50     Current Facility-Administered Medications  Medication Dose Route Frequency Provider Last Rate Last Dose  . albuterol (PROVENTIL HFA;VENTOLIN HFA) 108 (90 Base) MCG/ACT inhaler 2 puff  2 puff Inhalation Q4H PRN Sharlett Iles, MD   2 puff at 01/13/16 [phone]  . allopurinol (ZYLOPRIM) tablet 100 mg  100 mg Oral Daily Lakeya Mulka, MD      . alum & mag hydroxide-simeth (MAALOX/MYLANTA) 200-200-20 MG/5ML suspension 30 mL  30 mL Oral PRN Sharlett Iles, MD      . aspirin EC tablet 81 mg  81 mg Oral Daily Sharlett Iles, MD   81 mg at 01/13/16 7616  . carvedilol (COREG) tablet 6.25 mg  6.25 mg Oral BID WC Sharlett Iles, MD   6.25 mg at 01/13/16 0851  . citalopram (CELEXA) tablet 10 mg  10 mg Oral Daily Maly Lemarr, MD      . LORazepam (ATIVAN) tablet 1 mg  1 mg Oral Q8H PRN Wenda Overland Little, MD      . nicotine (NICODERM CQ - dosed in mg/24 hours) patch 21 mg  21 mg Transdermal Daily Sharlett Iles, MD   21 mg at 01/13/16 0737  . ondansetron (ZOFRAN) tablet 4 mg  4 mg Oral Q8H PRN Sharlett Iles, MD       Current Outpatient Prescriptions  Medication Sig Dispense Refill  . albuterol (PROVENTIL HFA;VENTOLIN HFA) 108 (90 Base) MCG/ACT inhaler Inhale 2 puffs into the lungs every 4 (four) hours as needed for wheezing or shortness of breath. 1 Inhaler 0  . albuterol (PROVENTIL) (2.5 MG/3ML) 0.083% nebulizer solution Take 2.5 mg by nebulization every 6 (six) hours as needed for wheezing or shortness of breath.    Marland Kitchen aspirin EC 81 MG tablet Take 81 mg by mouth daily. Reported on 11/19/2015    . carvedilol (COREG) 6.25 MG tablet Take 1 tablet (6.25 mg total) by  mouth 2 (two) times daily with a meal. 60 tablet 0  . oxyCODONE-acetaminophen (PERCOCET/ROXICET) 5-325 MG tablet Take 1 tablet by mouth every 6 (six) hours as needed for severe pain. Reported on 11/19/2015    . diphenoxylate-atropine (LOMOTIL) 2.5-0.025 MG tablet Take 1 tablet by mouth 4 (four) times daily as needed for diarrhea or loose stools. (Patient not taking: Reported on 11/19/2015) 30 tablet 0  . feeding supplement, ENSURE ENLIVE, (ENSURE ENLIVE) LIQD Take 237 mLs by mouth 2 (two) times daily between meals. (Patient not taking: Reported on 11/19/2015) 237 mL 12  . predniSONE (DELTASONE) 20 MG tablet Take 3 tablets (60 mg total) by mouth daily. (Patient not taking: Reported on 11/19/2015) 15 tablet 0    Musculoskeletal: Strength & Muscle Tone: within normal limits Gait & Station: normal Patient leans: N/A  Psychiatric Specialty Exam: Physical Exam  Constitutional: He is oriented to person, place, and time.  Neck: Normal range of motion.  Respiratory: Effort normal.  Musculoskeletal: He exhibits edema (left foot).   Neurological: He is alert and oriented to person, place, and time.  Skin: There is erythema.  Left leg covered in multiple scabbed lesions.  Patient states that he was bitten buy ants.  Also swollen, inflamed foot; states that he he has gout.  Cellulitis.    Psychiatric: He has a normal mood and affect. His speech is normal. He is not actively hallucinating. Thought content is not paranoid. Cognition and memory are normal. He expresses impulsivity. He expresses no homicidal and no suicidal ideation.    ROS  Blood pressure 116/62, pulse 99, temperature 98 F (36.7 C), temperature source Oral, resp. rate 16, height _0  (1.753 m), weight 40.8 kg (90 lb), SpO2 99 %.Body mass index is 13.29 kg/m.  General Appearance: Disheveled  Eye Contact:  Good  Speech:  Clear and Coherent and Normal Rate  Volume:  Normal  Mood:  "I feel good"  Affect:  Congruent  Thought Process:  Coherent and Goal Directed  Orientation:  Full (Time, Place, and Person)  Thought Content:  Logical  Suicidal Thoughts:  No  Homicidal Thoughts:  No  Memory:  Immediate;   Good Recent;   Good Remote;   Good  Judgement:  Fair  Insight:  Present  Psychomotor Activity:  Normal  Concentration:  Concentration: Fair and Attention Span: Fair  Recall:  Good  Fund of Knowledge:  Fair  Language:  Good  Akathisia:  No  Handed:  Right  AIMS (if indicated):     Assets:  Communication Skills Desire for Improvement  ADL's:  Intact  Cognition:  WNL  Sleep:        Treatment Plan Summary: Plan Discharge home  Disposition: No evidence of imminent risk to self or others at present.   Patient does not meet criteria for psychiatric inpatient admission.  Discharge   Earleen Newport, NP 01/13/2016 10:34 AM  Patient seen face-to-face for psychiatric evaluation, chart reviewed and case discussed with the physician extender and developed treatment plan. Reviewed the information documented and agree with the treatment plan. Corena Pilgrim, MD

## 2016-01-13 NOTE — ED Notes (Signed)
Pt. Transferred to [facility] from ED to room 37. Report to include Situation, Background, Assessment and Recommendations from [facility]. Pt. Oriented to unit including Q15 minute rounds as well as the security cameras for their protection. Patient is alert and oriented, warm and dry in no acute distress. Patient denies SI, HI, and AVH. Pt. Encouraged to let me know if needs arise.

## 2016-01-14 ENCOUNTER — Emergency Department (HOSPITAL_COMMUNITY): Payer: Medicare Other

## 2016-01-14 ENCOUNTER — Encounter (HOSPITAL_COMMUNITY): Payer: Self-pay | Admitting: *Deleted

## 2016-01-14 ENCOUNTER — Inpatient Hospital Stay (HOSPITAL_COMMUNITY)
Admission: EM | Admit: 2016-01-14 | Discharge: 2016-01-29 | DRG: 064 | Disposition: A | Discharge status: Hospice | Payer: Medicare Other | Attending: Neurology | Admitting: Neurology

## 2016-01-14 DIAGNOSIS — I482 Chronic atrial fibrillation: Secondary | ICD-10-CM | POA: Diagnosis not present

## 2016-01-14 DIAGNOSIS — J690 Pneumonitis due to inhalation of food and vomit: Secondary | ICD-10-CM | POA: Diagnosis not present

## 2016-01-14 DIAGNOSIS — L8990 Pressure ulcer of unspecified site, unspecified stage: Secondary | ICD-10-CM | POA: Insufficient documentation

## 2016-01-14 DIAGNOSIS — G8194 Hemiplegia, unspecified affecting left nondominant side: Secondary | ICD-10-CM | POA: Diagnosis not present

## 2016-01-14 DIAGNOSIS — Z01818 Encounter for other preprocedural examination: Secondary | ICD-10-CM | POA: Diagnosis not present

## 2016-01-14 DIAGNOSIS — I739 Peripheral vascular disease, unspecified: Secondary | ICD-10-CM | POA: Diagnosis not present

## 2016-01-14 DIAGNOSIS — Z515 Encounter for palliative care: Secondary | ICD-10-CM

## 2016-01-14 DIAGNOSIS — E785 Hyperlipidemia, unspecified: Secondary | ICD-10-CM | POA: Diagnosis present

## 2016-01-14 DIAGNOSIS — D62 Acute posthemorrhagic anemia: Secondary | ICD-10-CM

## 2016-01-14 DIAGNOSIS — W19XXXA Unspecified fall, initial encounter: Secondary | ICD-10-CM | POA: Diagnosis not present

## 2016-01-14 DIAGNOSIS — E870 Hyperosmolality and hypernatremia: Secondary | ICD-10-CM

## 2016-01-14 DIAGNOSIS — R402352 Coma scale, best motor response, localizes pain, at arrival to emergency department: Secondary | ICD-10-CM | POA: Diagnosis present

## 2016-01-14 DIAGNOSIS — Z681 Body mass index (BMI) 19 or less, adult: Secondary | ICD-10-CM | POA: Diagnosis not present

## 2016-01-14 DIAGNOSIS — R64 Cachexia: Secondary | ICD-10-CM | POA: Diagnosis present

## 2016-01-14 DIAGNOSIS — G911 Obstructive hydrocephalus: Secondary | ICD-10-CM | POA: Diagnosis not present

## 2016-01-14 DIAGNOSIS — Z720 Tobacco use: Secondary | ICD-10-CM | POA: Diagnosis not present

## 2016-01-14 DIAGNOSIS — S0990XA Unspecified injury of head, initial encounter: Secondary | ICD-10-CM

## 2016-01-14 DIAGNOSIS — I480 Paroxysmal atrial fibrillation: Secondary | ICD-10-CM | POA: Diagnosis not present

## 2016-01-14 DIAGNOSIS — F1290 Cannabis use, unspecified, uncomplicated: Secondary | ICD-10-CM | POA: Diagnosis present

## 2016-01-14 DIAGNOSIS — R064 Hyperventilation: Secondary | ICD-10-CM

## 2016-01-14 DIAGNOSIS — I69391 Dysphagia following cerebral infarction: Secondary | ICD-10-CM | POA: Diagnosis not present

## 2016-01-14 DIAGNOSIS — I442 Atrioventricular block, complete: Secondary | ICD-10-CM | POA: Diagnosis not present

## 2016-01-14 DIAGNOSIS — J9601 Acute respiratory failure with hypoxia: Secondary | ICD-10-CM | POA: Diagnosis not present

## 2016-01-14 DIAGNOSIS — R402132 Coma scale, eyes open, to sound, at arrival to emergency department: Secondary | ICD-10-CM | POA: Diagnosis present

## 2016-01-14 DIAGNOSIS — Z452 Encounter for adjustment and management of vascular access device: Secondary | ICD-10-CM

## 2016-01-14 DIAGNOSIS — I6789 Other cerebrovascular disease: Secondary | ICD-10-CM | POA: Diagnosis not present

## 2016-01-14 DIAGNOSIS — R471 Dysarthria and anarthria: Secondary | ICD-10-CM | POA: Diagnosis present

## 2016-01-14 DIAGNOSIS — R402222 Coma scale, best verbal response, incomprehensible words, at arrival to emergency department: Secondary | ICD-10-CM | POA: Diagnosis present

## 2016-01-14 DIAGNOSIS — G8104 Flaccid hemiplegia affecting left nondominant side: Secondary | ICD-10-CM | POA: Diagnosis present

## 2016-01-14 DIAGNOSIS — E876 Hypokalemia: Secondary | ICD-10-CM | POA: Diagnosis not present

## 2016-01-14 DIAGNOSIS — L89211 Pressure ulcer of right hip, stage 1: Secondary | ICD-10-CM | POA: Diagnosis present

## 2016-01-14 DIAGNOSIS — R200 Anesthesia of skin: Secondary | ICD-10-CM | POA: Diagnosis not present

## 2016-01-14 DIAGNOSIS — E875 Hyperkalemia: Secondary | ICD-10-CM

## 2016-01-14 DIAGNOSIS — Z590 Homelessness: Secondary | ICD-10-CM

## 2016-01-14 DIAGNOSIS — R000 Tachycardia, unspecified: Secondary | ICD-10-CM

## 2016-01-14 DIAGNOSIS — J9690 Respiratory failure, unspecified, unspecified whether with hypoxia or hypercapnia: Secondary | ICD-10-CM

## 2016-01-14 DIAGNOSIS — I110 Hypertensive heart disease with heart failure: Secondary | ICD-10-CM | POA: Diagnosis present

## 2016-01-14 DIAGNOSIS — E43 Unspecified severe protein-calorie malnutrition: Secondary | ICD-10-CM | POA: Diagnosis present

## 2016-01-14 DIAGNOSIS — I429 Cardiomyopathy, unspecified: Secondary | ICD-10-CM | POA: Diagnosis present

## 2016-01-14 DIAGNOSIS — Z66 Do not resuscitate: Secondary | ICD-10-CM | POA: Diagnosis not present

## 2016-01-14 DIAGNOSIS — G936 Cerebral edema: Secondary | ICD-10-CM

## 2016-01-14 DIAGNOSIS — H53462 Homonymous bilateral field defects, left side: Secondary | ICD-10-CM

## 2016-01-14 DIAGNOSIS — I63511 Cerebral infarction due to unspecified occlusion or stenosis of right middle cerebral artery: Secondary | ICD-10-CM | POA: Diagnosis not present

## 2016-01-14 DIAGNOSIS — I63411 Cerebral infarction due to embolism of right middle cerebral artery: Secondary | ICD-10-CM | POA: Diagnosis present

## 2016-01-14 DIAGNOSIS — G9340 Encephalopathy, unspecified: Secondary | ICD-10-CM | POA: Diagnosis not present

## 2016-01-14 DIAGNOSIS — H5347 Heteronymous bilateral field defects: Secondary | ICD-10-CM | POA: Diagnosis present

## 2016-01-14 DIAGNOSIS — Z950 Presence of cardiac pacemaker: Secondary | ICD-10-CM

## 2016-01-14 DIAGNOSIS — R29719 NIHSS score 19: Secondary | ICD-10-CM | POA: Diagnosis present

## 2016-01-14 DIAGNOSIS — L298 Other pruritus: Secondary | ICD-10-CM | POA: Diagnosis present

## 2016-01-14 DIAGNOSIS — W57XXXA Bitten or stung by nonvenomous insect and other nonvenomous arthropods, initial encounter: Secondary | ICD-10-CM | POA: Diagnosis not present

## 2016-01-14 DIAGNOSIS — G935 Compression of brain: Secondary | ICD-10-CM

## 2016-01-14 DIAGNOSIS — I5043 Acute on chronic combined systolic (congestive) and diastolic (congestive) heart failure: Secondary | ICD-10-CM | POA: Diagnosis present

## 2016-01-14 DIAGNOSIS — I63419 Cerebral infarction due to embolism of unspecified middle cerebral artery: Secondary | ICD-10-CM | POA: Diagnosis not present

## 2016-01-14 DIAGNOSIS — Z0189 Encounter for other specified special examinations: Secondary | ICD-10-CM

## 2016-01-14 DIAGNOSIS — S01112A Laceration without foreign body of left eyelid and periocular area, initial encounter: Secondary | ICD-10-CM | POA: Diagnosis present

## 2016-01-14 DIAGNOSIS — R0682 Tachypnea, not elsewhere classified: Secondary | ICD-10-CM

## 2016-01-14 DIAGNOSIS — I10 Essential (primary) hypertension: Secondary | ICD-10-CM | POA: Diagnosis not present

## 2016-01-14 DIAGNOSIS — N179 Acute kidney failure, unspecified: Secondary | ICD-10-CM | POA: Diagnosis not present

## 2016-01-14 DIAGNOSIS — Z4659 Encounter for fitting and adjustment of other gastrointestinal appliance and device: Secondary | ICD-10-CM

## 2016-01-14 DIAGNOSIS — Z23 Encounter for immunization: Secondary | ICD-10-CM | POA: Diagnosis present

## 2016-01-14 DIAGNOSIS — F17210 Nicotine dependence, cigarettes, uncomplicated: Secondary | ICD-10-CM | POA: Diagnosis present

## 2016-01-14 DIAGNOSIS — R4182 Altered mental status, unspecified: Secondary | ICD-10-CM | POA: Diagnosis present

## 2016-01-14 DIAGNOSIS — R5383 Other fatigue: Secondary | ICD-10-CM

## 2016-01-14 DIAGNOSIS — I4891 Unspecified atrial fibrillation: Secondary | ICD-10-CM

## 2016-01-14 DIAGNOSIS — L89222 Pressure ulcer of left hip, stage 2: Secondary | ICD-10-CM | POA: Diagnosis present

## 2016-01-14 DIAGNOSIS — I639 Cerebral infarction, unspecified: Secondary | ICD-10-CM | POA: Diagnosis present

## 2016-01-14 DIAGNOSIS — F1010 Alcohol abuse, uncomplicated: Secondary | ICD-10-CM | POA: Diagnosis present

## 2016-01-14 DIAGNOSIS — R404 Transient alteration of awareness: Secondary | ICD-10-CM | POA: Diagnosis not present

## 2016-01-14 DIAGNOSIS — R339 Retention of urine, unspecified: Secondary | ICD-10-CM | POA: Diagnosis not present

## 2016-01-14 DIAGNOSIS — Z8572 Personal history of non-Hodgkin lymphomas: Secondary | ICD-10-CM

## 2016-01-14 DIAGNOSIS — R9401 Abnormal electroencephalogram [EEG]: Secondary | ICD-10-CM | POA: Diagnosis present

## 2016-01-14 DIAGNOSIS — R739 Hyperglycemia, unspecified: Secondary | ICD-10-CM | POA: Diagnosis present

## 2016-01-14 DIAGNOSIS — R1310 Dysphagia, unspecified: Secondary | ICD-10-CM | POA: Diagnosis present

## 2016-01-14 DIAGNOSIS — D649 Anemia, unspecified: Secondary | ICD-10-CM | POA: Diagnosis present

## 2016-01-14 DIAGNOSIS — Z7982 Long term (current) use of aspirin: Secondary | ICD-10-CM

## 2016-01-14 DIAGNOSIS — I6521 Occlusion and stenosis of right carotid artery: Secondary | ICD-10-CM | POA: Diagnosis present

## 2016-01-14 HISTORY — DX: Malignant (primary) neoplasm, unspecified: C80.1

## 2016-01-14 HISTORY — DX: Unspecified atrial fibrillation: I48.91

## 2016-01-14 HISTORY — DX: Hyperlipidemia, unspecified: E78.5

## 2016-01-14 HISTORY — DX: Benign neoplasm, unspecified site: D36.9

## 2016-01-14 LAB — COMPREHENSIVE METABOLIC PANEL
ALT: 15 U/L — ABNORMAL LOW (ref 17–63)
ANION GAP: 8 (ref 5–15)
AST: 20 U/L (ref 15–41)
Albumin: 3.3 g/dL — ABNORMAL LOW (ref 3.5–5.0)
Alkaline Phosphatase: 92 U/L (ref 38–126)
BILIRUBIN TOTAL: 0.5 mg/dL (ref 0.3–1.2)
BUN: 18 mg/dL (ref 6–20)
CHLORIDE: 109 mmol/L (ref 101–111)
CO2: 21 mmol/L — ABNORMAL LOW (ref 22–32)
Calcium: 9 mg/dL (ref 8.9–10.3)
Creatinine, Ser: 0.77 mg/dL (ref 0.61–1.24)
Glucose, Bld: 150 mg/dL — ABNORMAL HIGH (ref 65–99)
POTASSIUM: 4.5 mmol/L (ref 3.5–5.1)
Sodium: 138 mmol/L (ref 135–145)
TOTAL PROTEIN: 7.1 g/dL (ref 6.5–8.1)

## 2016-01-14 LAB — I-STAT CHEM 8, ED
BUN: 24 mg/dL — AB (ref 6–20)
CALCIUM ION: 1.11 mmol/L — AB (ref 1.12–1.23)
CREATININE: 0.6 mg/dL — AB (ref 0.61–1.24)
Chloride: 108 mmol/L (ref 101–111)
Glucose, Bld: 146 mg/dL — ABNORMAL HIGH (ref 65–99)
HEMATOCRIT: 38 % — AB (ref 39.0–52.0)
HEMOGLOBIN: 12.9 g/dL — AB (ref 13.0–17.0)
Potassium: 4.5 mmol/L (ref 3.5–5.1)
SODIUM: 140 mmol/L (ref 135–145)
TCO2: 20 mmol/L (ref 0–100)

## 2016-01-14 LAB — CBC
HEMATOCRIT: 38 % — AB (ref 39.0–52.0)
HEMOGLOBIN: 11.5 g/dL — AB (ref 13.0–17.0)
MCH: 29.3 pg (ref 26.0–34.0)
MCHC: 30.3 g/dL (ref 30.0–36.0)
MCV: 96.7 fL (ref 78.0–100.0)
Platelets: 209 10*3/uL (ref 150–400)
RBC: 3.93 MIL/uL — AB (ref 4.22–5.81)
RDW: 15.5 % (ref 11.5–15.5)
WBC: 9.7 10*3/uL (ref 4.0–10.5)

## 2016-01-14 LAB — SAMPLE TO BLOOD BANK

## 2016-01-14 LAB — I-STAT CG4 LACTIC ACID, ED: LACTIC ACID, VENOUS: 1.95 mmol/L — AB (ref 0.5–1.9)

## 2016-01-14 LAB — PROTIME-INR
INR: 1.12
PROTHROMBIN TIME: 14.4 s (ref 11.4–15.2)

## 2016-01-14 LAB — CBG MONITORING, ED: Glucose-Capillary: 146 mg/dL — ABNORMAL HIGH (ref 65–99)

## 2016-01-14 LAB — MRSA PCR SCREENING: MRSA BY PCR: NEGATIVE

## 2016-01-14 LAB — ETHANOL: Alcohol, Ethyl (B): <5 mg/dL (ref <5)

## 2016-01-14 MED ORDER — SODIUM CHLORIDE 0.9 % IV BOLUS (SEPSIS): 1000.0000 mL | Freq: Once | INTRAVENOUS | Status: DC

## 2016-01-14 MED ORDER — SODIUM CHLORIDE 0.9 % IV SOLN
INTRAVENOUS | Status: AC | PRN
  Administered 2016-01-14: 1000 mL via INTRAVENOUS

## 2016-01-14 MED ORDER — STROKE: EARLY STAGES OF RECOVERY BOOK
Freq: Once | Status: DC
  Filled 2016-01-14: qty 1

## 2016-01-14 MED ORDER — CETYLPYRIDINIUM CHLORIDE 0.05 % MT LIQD
7.0000 mL | Freq: Two times a day (BID) | OROMUCOSAL | Status: DC
  Administered 2016-01-15 – 2016-01-17 (×4): 7 mL via OROMUCOSAL

## 2016-01-14 MED ORDER — DILTIAZEM LOAD VIA INFUSION
10.0000 mg | Freq: Once | INTRAVENOUS | Status: AC
  Administered 2016-01-14: 10 mg via INTRAVENOUS
  Filled 2016-01-14: qty 10

## 2016-01-14 MED ORDER — ACETAMINOPHEN 650 MG RE SUPP
325.0000 mg | RECTAL | Status: DC | PRN
  Administered 2016-01-15 – 2016-01-16 (×4): 325 mg via RECTAL
  Filled 2016-01-14 (×4): qty 1

## 2016-01-14 MED ORDER — DILTIAZEM HCL 100 MG IV SOLR
5.0000 mg/h | INTRAVENOUS | Status: DC
  Administered 2016-01-14: 5 mg/h via INTRAVENOUS
  Administered 2016-01-15: 7.5 mg/h via INTRAVENOUS
  Administered 2016-01-16: 5 mg/h via INTRAVENOUS
  Filled 2016-01-14 (×4): qty 100

## 2016-01-14 MED ORDER — IOPAMIDOL (ISOVUE-300) INJECTION 61%
INTRAVENOUS | Status: AC
Start: 2016-01-14 — End: 2016-01-14
  Administered 2016-01-14: 80 mL
  Filled 2016-01-14: qty 100

## 2016-01-14 MED ORDER — ASPIRIN 300 MG RE SUPP
300.0000 mg | Freq: Every day | RECTAL | Status: DC
  Administered 2016-01-14 – 2016-01-24 (×2): 300 mg via RECTAL
  Filled 2016-01-14 (×4): qty 1

## 2016-01-14 MED ORDER — CHLORHEXIDINE GLUCONATE 0.12 % MT SOLN
15.0000 mL | Freq: Two times a day (BID) | OROMUCOSAL | Status: DC
  Administered 2016-01-14 – 2016-01-21 (×10): 15 mL via OROMUCOSAL
  Filled 2016-01-14 (×8): qty 15

## 2016-01-14 MED ORDER — ASPIRIN 325 MG PO TABS
325.0000 mg | ORAL_TABLET | Freq: Every day | ORAL | Status: DC
  Administered 2016-01-16 – 2016-01-25 (×8): 325 mg via ORAL
  Filled 2016-01-14 (×10): qty 1

## 2016-01-14 MED ORDER — SODIUM CHLORIDE 0.9 % IV SOLN: INTRAVENOUS | Status: DC

## 2016-01-14 MED ORDER — CETYLPYRIDINIUM CHLORIDE 0.05 % MT LIQD
7.0000 mL | Freq: Two times a day (BID) | OROMUCOSAL | Status: DC
  Administered 2016-01-14 – 2016-01-16 (×3): 7 mL via OROMUCOSAL

## 2016-01-14 MED ORDER — SODIUM CHLORIDE 0.9 % IV SOLN
INTRAVENOUS | Status: DC
  Administered 2016-01-14: 75 mL/h via INTRAVENOUS
  Administered 2016-01-15: 06:00:00 via INTRAVENOUS

## 2016-01-14 NOTE — ED Notes (Signed)
Pt returned from [place], Neurologist at bedside for assessment

## 2016-01-14 NOTE — Progress Notes (Signed)
   01/14/16 1611  Clinical Encounter Type  Visited With Patient  Visit Type ED  Referral From Nurse  Consult/Referral To Chaplain  Stress Factors  Patient Stress Factors Exhausted;Health changes  Advance Directives (For Healthcare)  Does patient have an advance directive? No  Would patient like information on creating an advanced directive? Yes - Educational materials given   Chaplain responded to ED page, level 2 trauma fall.  Patient was unresponsive when asked whether he wanted family contacted. Nurse said he was a regular visitor to the ED and never has family present.  Vilinda Blanks Elantra Caprara 01/14/16 4:23 PM

## 2016-01-14 NOTE — ED Notes (Signed)
Arrival to CT for radiology studies with RN.

## 2016-01-14 NOTE — Progress Notes (Signed)
Cardizem stopped due to bradycardia, rate of 51-60. Patient still remains in A-fib. Will continue to monitor.

## 2016-01-14 NOTE — ED Notes (Signed)
Per dr. Roxanne Mins, activate CODE STROKE (activation 1617) at this time for left sided weakness, right gaze. LSN 1400- the time which pt left jail.

## 2016-01-14 NOTE — Progress Notes (Signed)
Orthopedic Tech Progress Note Patient Details:  [name] [birth year] [id number] Level 2 trauma ortho visit. Patient ID: [name], male   DOB: 12-16-1945, 70 y.o.   MRN: [id number]   Braulio Bosch 01/14/2016, 4:01 PM

## 2016-01-14 NOTE — ED Notes (Signed)
PT arrives via EMS. Pt was released from jail, they carried him to a bench, where he fell over and hit his head. Left upper extremity paralysis and right sided gaze. IV established, VSS, EKG wnl, CBG 159.

## 2016-01-14 NOTE — ED Triage Notes (Signed)
Pt here as a level 2 trauma after a fall and pt unable to move left arm , upon arrival pt also called a code stroke  With lsn at  1400

## 2016-01-14 NOTE — H&P (Signed)
Neurology Consult Note  Reason for Consultation: CODE STROKE  Requesting provider: [name], MD  CC: "I'm hungry"  HPI: This is a 30-yo man who was in jail last night and was released this afternoon. By report, he was released this afternoon. Per ED RN, EMS reported that he got into a "scuffle" with officers at the jail. They reportedly carried him out to a bench. He fell off of the bench and EMS was activated. They arrived to find the patient with left-sided weakness and abrasions over his face. He was initially thought to be a trauma but on arrival to ED the attending called [facility]. The patient is not complaining of anything at this time apart from wanting something to eat. No further information is available at this time.   Last seen normal: 1430 NIHSS score: 19 tPA given: No, subacute stroke seen on CT, ASPECTS score 2   PMH:  Past Medical History:  Diagnosis Date  . A-fib ([place])   . 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 Complete heart block ([place])   . GIB (gastrointestinal bleeding)   . Hyperlipidemia   . Hypertension   . Tubular adenoma     PSH:  Past Surgical History:  Procedure Laterality Date  . APPENDECTOMY    . CHOLECYSTECTOMY    . ESOPHAGOGASTRODUODENOSCOPY    . PACEMAKER PLACEMENT    . PARTIAL COLECTOMY      Family history: History reviewed. No pertinent family history.  Social history:  Social History   Social History  . Marital status: Single    Spouse name: N/A  . Number of children: N/A  . Years of education: N/A   Occupational History  . Not on file.   Social History Main Topics  . Smoking status: Current Every Day Smoker  . Smokeless tobacco: Never Used  . Alcohol use Yes  . Drug use:     Types: Marijuana  . Sexual activity: Not on file   Other Topics Concern  . Not on file   Social History Narrative  . No narrative on file      Current inpatient meds:  Current Facility-Administered Medications  Medication Dose Route Frequency  Provider Last Rate Last Dose  . sodium chloride 0.9 % bolus 1,000 mL  1,000 mL Intravenous Once Vira Blanco, MD       And  . 0.9 %  sodium chloride infusion   Intravenous Continuous Vira Blanco, MD      . 0.9 %  sodium chloride infusion   Intravenous Continuous PRN Vira Blanco, MD 999 mL/hr at 01/14/16 1608 1,000 mL at 01/14/16 1608   No current outpatient prescriptions on file.    Allergies: No Known Allergies  ROS: As per HPI. A full 14-point review of systems was performed and the patient is otherwise not reporting any concerns.   PE:  BP 152/82   Pulse 96   Temp 97.7 F (36.5 C) (Oral)   Resp (!) 32   Ht 5\' 9"  (1.753 m)   Wt 40.8 kg (90 lb)   SpO2 97%   BMI 13.29 kg/m   General: Cachectic appearing [name] lying on ED gurney. He is AAO x4. Speech is moderately dysarthric. No aphasia. He is irritable and resists the exam.  HEENT: Several abrasions and contusions noted on his face. MMM, OP clear. Dentition poor. Sclerae anicteric. Moderate conjunctival injection.  CV: Irregularly irregular, no murmur. Carotid pulses full and symmetric, no bruits. Distal pulses 2+ and symmetric.  Lungs: CTAB.  Abdomen: Soft, thin, non-distended, non-tender. Bowel sounds present x4.  Extremities: No C/C/E. He has numerous small (2-3 mm or less) red-violet lesions over the LLE.  Neuro:  CN: Pupils are equal and round. They are symmetrically reactive from 3-->2 mm. He has a right gaze deviation. He blinks to threat from the right but not the left. He has a weak corneal on the left. He resists eye opening with good strength on the right. He has weak eye closure on the left with asymmetric grimace consistent with a left central VII pattern of weakness. Hearing is intact to conversational voice.The remainder of the exam cannot be assessed as he does not participate with the exam.  Motor: Bulk is markedly diminished throughout. He does not participate with confrontational strength testing. He  has grossly normal strength on the right side. The LUE has reduced tone with no observed movement. He is able to move the LLE with about 4/5 strength.  No tremor or other abnormal movements. No drift.  Sensation: He does not respond to noxious stimuli on the left side.  DTRs: 2+ on the right, 3+ on the left. Toes downgoing on the right, upgoing on the left.  Coordination and gait: These cannot be tested at this time. He will not cooperate with coordination testing and his hemiparesis precludes walking.   Labs:  Lab Results  Component Value Date   WBC 9.7 01/14/2016   HGB 12.9 (L) 01/14/2016   HCT 38.0 (L) 01/14/2016   PLT 209 01/14/2016   GLUCOSE 146 (H) 01/14/2016   ALT 15 (L) 01/14/2016   AST 20 01/14/2016   NA 140 01/14/2016   K 4.5 01/14/2016   CL 108 01/14/2016   CREATININE 0.60 (L) 01/14/2016   BUN 24 (H) 01/14/2016   CO2 21 (L) 01/14/2016   INR 1.12 01/14/2016    Imaging:  I have personally and independently reviewed the [facility] without contrast from today. This shows hypodensity throughout the R MCA territory with sulcal effacement, no midline shift. This is consistent with a subacute ischemic infarction. The RICA and R MCA are hyperdense. There is chronic encephalomalacia in the posterior aspect of the L parietal lobe consistent with an old infarct. Mild symmetric calcification is present in both basal ganglia. Moderate diffuse generalized atrophy is present.   CT chest/abdomen/pelvis report reviewed. This showed a filling defect in the apex of the left ventricle concerning for thrombus. Mild interstitial pulmonary edema and trace bilateral pleural effusions were seen. 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 Prior splenic infarct noted. Cardiomegaly with left ventricular and left atrial dilatation present.   CT maxillofacial report reviewed. No significant  acute traumatic injury to facial bones noted.   CT cervical spine report reviewed. No significant acute traumatic injury to cervical spine.    Assessment and Plan:  1. Acute ischemic stroke: This is an acute stroke involving the R MCA territory. It is most likely embolic in etiology given afib and apparent apical thrombus on CT chest. His stroke appears to be several hours old in CT with ASPECTS score of 2 so he is not a candidate for IV tPA or endovascular treatment. Known risk factors for cerebrovascular disease in this patient include afib, HTN, hyperlipidemia, tobacco abuse, and chronic alcohol abuse. Additional workup will be ordered to include MRI brain, carotid Dopplers, TTE, fasting lipids, and hemoglobin a1c.Further testing will be  determined by results from these initial studies. Recommend antiplatelet therapy with aspirin for secondary stroke prevention. Initiate statin once able to take PO meds with goal LDL less than 70. Ensure adequate glucose control. Allow permissive hypertension in the acute phase, treating only SBP greater than 220 mmHg and/or DBP greater than 110 mmHg. Avoid fever and hyperglycemia as these can extend the infarct. Avoid hypotonic IVF to minimize exacerbation of post-stroke edema. Initiate rehab services. DVT prophylaxis as needed.   2. Cerebral edema: This is acute, due to stroke. He may experience increased edema in the next few days and is at risk for potential herniation. Edema is typically maximal 3-5 days after stroke. Avoid hypotonic fluids and high volumes of IVF as these can exacerbate edema. Folow exam closely.   3. Left hemiparesis: This is acute, due to stroke. This involves face and arm more than leg, typical of R MCA stroke. PT/OT/rehab as able.   4. Left hemisensory loss: This is acute, due to stroke. PT/OT/rehab as able.   5. Left hemianopsia: Acute, due to stroke. PT/OT/rehab as able.   6. Dysarthria: Acute, due to stroke. ST. NPO until cleared by  ST.   No family present. The patient is a frequent visitor to the ED and they report that he is homeless and does not have any family. Social support is poor. His prognosis from this stroke is poor.   A total of 45 minutes of critical care time was spent on this case including review of chart, direct review of imaging, and coordination of care. Level of medical decision making complex.

## 2016-01-14 NOTE — ED Provider Notes (Signed)
[facility] DEPT Provider Note   CSN: [id number] Arrival date & time: 01/14/16  1555  First Provider Contact:  First MD Initiated Contact with Patient 01/14/16 [phone]        History   Chief Complaint Chief Complaint  Patient presents with  . Fall  . Altered Mental Status    HPI [name] is a 70 y.o. male.  The history is provided by the patient and the EMS personnel. The history is limited by the condition of the patient. No language interpreter was used.  Fall  Chronicity: Unclear, possibly yesterday. The problem occurs constantly. The problem has been gradually worsening. Pertinent negatives include no chest pain, no abdominal pain, no headaches and no shortness of breath. Nothing aggravates the symptoms. Nothing relieves the symptoms. He has tried nothing for the symptoms. The treatment provided no relief.    Past Medical History:  Diagnosis Date  . A-fib ([employer])   . 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 Complete heart block ([place])   . GIB (gastrointestinal bleeding)   . Hyperlipidemia   . Hypertension   . Tubular adenoma     Patient Active Problem List   Diagnosis Date Noted  . 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 APPENDECTOMY    . CHOLECYSTECTOMY    . ESOPHAGOGASTRODUODENOSCOPY    . PACEMAKER PLACEMENT    . PARTIAL COLECTOMY         Home Medications    Prior to Admission medications   Medication Sig Start Date End Date Taking? Authorizing Provider  albuterol (PROVENTIL HFA;VENTOLIN HFA) 108 (90 Base) MCG/ACT inhaler Inhale 2 puffs into the lungs every 4 (four) hours as needed for wheezing or shortness of breath.   Yes Historical Provider, MD  albuterol (PROVENTIL) (2.5 MG/3ML) 0.083% nebulizer solution Take 2.5 mg by nebulization every 6 (six) hours as needed for wheezing or shortness of breath.   Yes Historical Provider, MD  aspirin EC 81 MG tablet Take 81 mg by  mouth daily.   Yes Historical Provider, MD  carvedilol (COREG) 6.25 MG tablet Take 6.25 mg by mouth 2 (two) times daily with a meal.   Yes Historical Provider, MD  oxyCODONE-acetaminophen (PERCOCET/ROXICET) 5-325 MG tablet Take 1 tablet by mouth every 6 (six) hours as needed for severe pain.   Yes Historical Provider, MD  allopurinol (ZYLOPRIM) 100 MG tablet Take 100 mg by mouth daily.    Historical Provider, MD  cephALEXin (KEFLEX) 500 MG capsule Take 500 mg by mouth 2 (two) times daily.    Historical Provider, MD  sertraline (ZOLOFT) 25 MG tablet Take 25 mg by mouth daily.    Historical Provider, MD    Family History History reviewed. No pertinent family history.  Social History Social History  Substance Use Topics  . Smoking status: Current Every Day Smoker  . Smokeless tobacco: Never Used  . Alcohol use Yes     Allergies   Review of patient's allergies indicates no known allergies.   Review of Systems Review of Systems  Unable to perform ROS: Mental status change  Respiratory: Negative for shortness of breath.   Cardiovascular: Negative for chest pain.  Gastrointestinal: Negative for abdominal pain.  Neurological: Negative for headaches.     Physical Exam Updated Vital Signs BP 149/85   Pulse 103   Temp 97.7 F (36.5 C) (Oral)   Resp 18   Ht 5\' 9"  (  1.753 m)   Wt 40.8 kg   SpO2 98%   BMI 13.29 kg/m   Physical Exam  Constitutional: He appears listless. He appears cachectic.  Non-toxic appearance. He has a sickly appearance. No distress.  HENT:  1 cm laceration to lateral left eyebrow, hemostatic. Dry blood to face.  Eyes: Conjunctivae are normal. Pupils are equal, round, and reactive to light.  Gaze to right  Neck:  C-collar in place, no cervical spinal tenderness.  Cardiovascular: An irregularly irregular rhythm present. Tachycardia present.   No murmur heard. Atrial fibrillation  Pulmonary/Chest: Effort normal and breath sounds normal. No respiratory  distress. He exhibits no tenderness.  Abdominal: Soft. He exhibits no distension. There is no tenderness.  Diffuse rash consistent with scabies  Musculoskeletal: He exhibits no edema.  Cachectic lower extremities, moves right and left lower extremity.  Neurological: He appears listless. He displays atrophy. Coordination and gait abnormal. GCS eye subscore is 3. GCS verbal subscore is 3. GCS motor subscore is 5.  No movement to left upper extremity, does not withdraw to pain.  Rightward deviated gaze.  Skin: Skin is warm and dry. Capillary refill takes 2 to 3 seconds.  Nursing note and vitals reviewed.    ED Treatments / Results  Labs (all labs ordered are listed, but only abnormal results are displayed) Labs Reviewed  COMPREHENSIVE METABOLIC PANEL - Abnormal; Notable for the following:       Result Value   CO2 21 (*)    Glucose, Bld 150 (*)    Albumin 3.3 (*)    ALT 15 (*)    All other components within normal limits  CBC - Abnormal; Notable for the following:    RBC 3.93 (*)    Hemoglobin 11.5 (*)    HCT 38.0 (*)    All other components within normal limits  I-STAT CHEM 8, ED - Abnormal; Notable for the following:    BUN 24 (*)    Creatinine, Ser 0.60 (*)    Glucose, Bld 146 (*)    Calcium, Ion 1.11 (*)    Hemoglobin 12.9 (*)    HCT 38.0 (*)    All other components within normal limits  I-STAT CG4 LACTIC ACID, ED - Abnormal; Notable for the following:    Lactic Acid, Venous 1.95 (*)    All other components within normal limits  CBG MONITORING, ED - Abnormal; Notable for the following:    Glucose-Capillary 146 (*)    All other components within normal limits  PROTIME-INR  CDS SEROLOGY  ETHANOL  URINALYSIS, ROUTINE W REFLEX MICROSCOPIC (NOT AT [facility])  HEMOGLOBIN A1C  LIPID PANEL  I-STAT CHEM 8, ED  SAMPLE TO BLOOD BANK    EKG  EKG Interpretation  Date/Time:  Sunday January 14 2016 16:10:55 EDT Ventricular Rate:  110 PR Interval:    QRS Duration: 106 QT  Interval:  374 QTC Calculation: 506 R Axis:   -88 Text Interpretation:  Atrial fibrillation Ventricular premature complex Left anterior fascicular block Probable anterior infarct, old Prolonged QT interval When compared with ECG of 01/13/2015, QT has lengthened Premature ventricular complexes are now Present Confirmed by Roxanne Mins  MD, DAVID ([id number]) on 01/14/2016 4:19:09 PM       Radiology Ct Head Wo Contrast  Result Date: 01/14/2016 CLINICAL DATA:  70 year old male with history of left-sided weakness and right-sided gaze. Code stroke. EXAM: CT HEAD WITHOUT CONTRAST CT MAXILLOFACIAL WITHOUT CONTRAST CT CERVICAL SPINE WITHOUT CONTRAST TECHNIQUE: Multidetector CT imaging of the head, cervical spine,  and maxillofacial structures were performed using the standard protocol without intravenous contrast. Multiplanar CT image reconstructions of the cervical spine and maxillofacial structures were also generated. COMPARISON:  No priors. FINDINGS: CT HEAD FINDINGS Loss of gray-white differentiation in the right frontal and parietal lobes, suggesting subacute ischemia in the right MCA distribution. Right MCA is hyperdense. There is well-defined low-attenuation in the left parietal and occipital regions, compatible with encephalomalacia from remote left-sided infarct. Physiologic calcifications are noted in the basal ganglia bilaterally. No signs of acute intracranial hemorrhage. No mass, mass effect, hydrocephalus or abnormal intra or extra-axial fluid collections. Visualized paranasal sinuses and mastoids are well pneumatized. CT MAXILLOFACIAL FINDINGS No acute displaced facial bone fractures. Specifically, pterygoid plates are intact. Mandible is intact, and mandibular condyles are located bilaterally. Bilateral globes and retro-orbital soft tissues are grossly normal in appearance. CT CERVICAL SPINE FINDINGS No acute displaced cervical spine fractures. Alignment is anatomic. Prevertebral soft tissues are normal. Mild  emphysematous changes are noted in the visualized portions of the lung apices. There is some patchy ground-glass attenuation and septal thickening in the lung apices. IMPRESSION: 1. Hyperdense right MCA indicative of thrombus, with evidence of subacute ischemia in the right MCA distribution, as above. 2. No evidence of significant acute traumatic injury to the facial bones or cervical spine. Critical Value/emergent results were called by telephone at the time of interpretation on 01/14/2016 at 4:34 pm to Dr. Shon Hale, who verbally acknowledged these results. Electronically Signed   By: Vinnie Langton M.D.   On: 01/14/2016 16:54   Ct Chest W Contrast  Result Date: 01/14/2016 CLINICAL DATA:  70 year old male with history of trauma from a fall. Found down. Right-sided gaze in left-sided weakness. Left-sided facial abrasions. EXAM: CT CHEST, ABDOMEN, AND PELVIS WITH CONTRAST TECHNIQUE: Multidetector CT imaging of the chest, abdomen and pelvis was performed following the standard protocol during bolus administration of intravenous contrast. CONTRAST:  4mL ISOVUE-300 IOPAMIDOL (ISOVUE-300) INJECTION 61% COMPARISON:  No priors. FINDINGS: CT CHEST FINDINGS Cardiovascular: Heart size is mildly enlarged with mild left atrial and left ventricular dilatation. Small eccentric filling defect in the inferior aspect of the apex of the left ventricle (axial image 54 of series 201, coronal image 57 of series 203, and sagittal image 113 of series 204), highly concerning for small apical thrombus. There is no significant pericardial fluid, thickening or pericardial calcification. There is aortic atherosclerosis, as well as atherosclerosis of the great vessels of the mediastinum and the coronary arteries, including calcified atherosclerotic plaque in the left anterior descending, ramus intermedius and right coronary arteries. Right-sided pacemaker device with lead tips terminating in the right atrial appendage in near the right  ventricular apex. Mediastinum/Nodes: Mildly enlarged low right paratracheal lymph node measuring up to 14 mm in short axis. Esophagus is unremarkable in appearance. No axillary lymphadenopathy. Lungs/Pleura: Scattered areas of mild ground-glass attenuation and septal thickening in the lungs bilaterally, most evident in the posterior aspect of the apices of the lungs, potentially related to mild interstitial pulmonary edema. Trace bilateral pleural effusions. Mild centrilobular emphysema. Musculoskeletal: There are no acute displaced fractures or aggressive appearing lytic or blastic lesions noted in the visualized portions of the skeleton. CT ABDOMEN AND PELVIS FINDINGS Hepatobiliary: No suspicious cystic or solid hepatic lesions. No intra or extrahepatic biliary ductal dilatation. Status post cholecystectomy. Pancreas: No pancreatic mass. No pancreatic ductal dilatation. No pancreatic or peripancreatic fluid or inflammatory changes. Spleen: Focal contour abnormality of the spleen where there is a peripheral low to intermediate attenuation fluid  collection, likely sequela of remote splenic infarct. No finding to suggest acute traumatic injury to the spleen. Adrenals/Urinary Tract: Mild thickening of the adrenal glands bilaterally, likely related to adrenal hyperplasia. Multifocal cortical thinning in the kidneys bilaterally. 2.1 cm low-attenuation lesion in the upper pole of the right kidney is compatible with a simple cyst. No hydroureteronephrosis. Urinary bladder is normal in appearance. Stomach/Bowel: Normal appearance of the stomach. No pathologic dilatation of small bowel or colon. Status post subtotal colectomy. Vascular/Lymphatic: Extensive aortic atherosclerosis. 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 Common iliac arteries, internal iliac arteries and external iliac arteries are completely occluded bilaterally. There is collateral flow  providing perfusion and branches of the internal iliac arteries and the common femoral arteries (and branches) bilaterally. Superior mesenteric artery and celiac axis are both patent, as are the renal arteries bilaterally. Inferior mesenteric artery appears occluded. No lymphadenopathy noted in the abdomen or pelvis. Reproductive: Prostate gland and seminal vesicles are unremarkable in appearance. Other: No significant volume of ascites.  No pneumoperitoneum. Musculoskeletal: No acute displaced fractures or aggressive appearing lytic or blastic lesions are noted in the visualized portions of the skeleton. Postoperative changes of ORIF in the right femur are incompletely visualized. IMPRESSION: 1. Filling defect in the apex of the left ventricle highly concerning for thrombus. This may suggest an embolic source as a cause of the patient's right MCA occlusion. This was discussed by telephone at the time of interpretation on 01/14/2016 at 5:02 pm to Dr. Shon Hale, who verbally acknowledged these results. 2. No evidence of significant acute traumatic injury to the chest, abdomen or pelvis. 3. The appearance of the lungs suggests mild congestive heart failure, with mild interstitial pulmonary edema and trace bilateral pleural effusions. 4. 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 Whether this is chronic (favored), or sequela of large embolic event is uncertain. 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 Celiac axis and SMA are both patent. 5. Area in the spleen which is likely related to prior splenic infarct, suggesting prior embolic event. 6. Cardiomegaly with left ventricular and left atrial dilatation. 7. Aortic atherosclerosis, in addition to 3 vessel coronary artery disease. Please note that although the presence of coronary artery calcium documents the presence of coronary artery disease, the severity of this disease and any potential  stenosis cannot be assessed on this non-gated CT examination. Assessment for potential risk factor modification, dietary therapy or pharmacologic therapy may be warranted, if clinically indicated. 8. Additional incidental findings, as above. These results were called by telephone at the time of interpretation on 01/14/2016 at 5:26 pm to Dr. Vira Blanco , who verbally acknowledged these results. Electronically Signed   By: Vinnie Langton M.D.   On: 01/14/2016 17:28   Ct Cervical Spine Wo Contrast  Result Date: 01/14/2016 CLINICAL DATA:  70 year old male with history of left-sided weakness and right-sided gaze. Code stroke. EXAM: CT HEAD WITHOUT CONTRAST CT MAXILLOFACIAL WITHOUT CONTRAST CT CERVICAL SPINE WITHOUT CONTRAST TECHNIQUE: Multidetector CT imaging of the head, cervical spine, and maxillofacial structures were performed using the standard protocol without intravenous contrast. Multiplanar CT image reconstructions of the cervical spine and maxillofacial structures were also generated. COMPARISON:  No priors. FINDINGS: CT HEAD FINDINGS Loss of gray-white differentiation in the right frontal and parietal lobes, suggesting subacute ischemia in the right MCA distribution. Right MCA is hyperdense. There is well-defined low-attenuation in the left parietal and  occipital regions, compatible with encephalomalacia from remote left-sided infarct. Physiologic calcifications are noted in the basal ganglia bilaterally. No signs of acute intracranial hemorrhage. No mass, mass effect, hydrocephalus or abnormal intra or extra-axial fluid collections. Visualized paranasal sinuses and mastoids are well pneumatized. CT MAXILLOFACIAL FINDINGS No acute displaced facial bone fractures. Specifically, pterygoid plates are intact. Mandible is intact, and mandibular condyles are located bilaterally. Bilateral globes and retro-orbital soft tissues are grossly normal in appearance. CT CERVICAL SPINE FINDINGS No acute displaced  cervical spine fractures. Alignment is anatomic. Prevertebral soft tissues are normal. Mild emphysematous changes are noted in the visualized portions of the lung apices. There is some patchy ground-glass attenuation and septal thickening in the lung apices. IMPRESSION: 1. Hyperdense right MCA indicative of thrombus, with evidence of subacute ischemia in the right MCA distribution, as above. 2. No evidence of significant acute traumatic injury to the facial bones or cervical spine. Critical Value/emergent results were called by telephone at the time of interpretation on 01/14/2016 at 4:34 pm to Dr. Shon Hale, who verbally acknowledged these results. Electronically Signed   By: Vinnie Langton M.D.   On: 01/14/2016 16:54   Ct Abdomen Pelvis W Contrast  Result Date: 01/14/2016 CLINICAL DATA:  70 year old male with history of trauma from a fall. Found down. Right-sided gaze in left-sided weakness. Left-sided facial abrasions. EXAM: CT CHEST, ABDOMEN, AND PELVIS WITH CONTRAST TECHNIQUE: Multidetector CT imaging of the chest, abdomen and pelvis was performed following the standard protocol during bolus administration of intravenous contrast. CONTRAST:  34mL ISOVUE-300 IOPAMIDOL (ISOVUE-300) INJECTION 61% COMPARISON:  No priors. FINDINGS: CT CHEST FINDINGS Cardiovascular: Heart size is mildly enlarged with mild left atrial and left ventricular dilatation. Small eccentric filling defect in the inferior aspect of the apex of the left ventricle (axial image 54 of series 201, coronal image 57 of series 203, and sagittal image 113 of series 204), highly concerning for small apical thrombus. There is no significant pericardial fluid, thickening or pericardial calcification. There is aortic atherosclerosis, as well as atherosclerosis of the great vessels of the mediastinum and the coronary arteries, including calcified atherosclerotic plaque in the left anterior descending, ramus intermedius and right coronary arteries.  Right-sided pacemaker device with lead tips terminating in the right atrial appendage in near the right ventricular apex. Mediastinum/Nodes: Mildly enlarged low right paratracheal lymph node measuring up to 14 mm in short axis. Esophagus is unremarkable in appearance. No axillary lymphadenopathy. Lungs/Pleura: Scattered areas of mild ground-glass attenuation and septal thickening in the lungs bilaterally, most evident in the posterior aspect of the apices of the lungs, potentially related to mild interstitial pulmonary edema. Trace bilateral pleural effusions. Mild centrilobular emphysema. Musculoskeletal: There are no acute displaced fractures or aggressive appearing lytic or blastic lesions noted in the visualized portions of the skeleton. CT ABDOMEN AND PELVIS FINDINGS Hepatobiliary: No suspicious cystic or solid hepatic lesions. No intra or extrahepatic biliary ductal dilatation. Status post cholecystectomy. Pancreas: No pancreatic mass. No pancreatic ductal dilatation. No pancreatic or peripancreatic fluid or inflammatory changes. Spleen: Focal contour abnormality of the spleen where there is a peripheral low to intermediate attenuation fluid collection, likely sequela of remote splenic infarct. No finding to suggest acute traumatic injury to the spleen. Adrenals/Urinary Tract: Mild thickening of the adrenal glands bilaterally, likely related to adrenal hyperplasia. Multifocal cortical thinning in the kidneys bilaterally. 2.1 cm low-attenuation lesion in the upper pole of the right kidney is compatible with a simple cyst. No hydroureteronephrosis. Urinary bladder is normal in appearance. Stomach/Bowel:  Normal appearance of the stomach. No pathologic dilatation of small bowel or colon. Status post subtotal colectomy. Vascular/Lymphatic: Extensive aortic atherosclerosis. 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 Common iliac arteries, internal iliac  arteries and external iliac arteries are completely occluded bilaterally. There is collateral flow providing perfusion and branches of the internal iliac arteries and the common femoral arteries (and branches) bilaterally. Superior mesenteric artery and celiac axis are both patent, as are the renal arteries bilaterally. Inferior mesenteric artery appears occluded. No lymphadenopathy noted in the abdomen or pelvis. Reproductive: Prostate gland and seminal vesicles are unremarkable in appearance. Other: No significant volume of ascites.  No pneumoperitoneum. Musculoskeletal: No acute displaced fractures or aggressive appearing lytic or blastic lesions are noted in the visualized portions of the skeleton. Postoperative changes of ORIF in the right femur are incompletely visualized. IMPRESSION: 1. Filling defect in the apex of the left ventricle highly concerning for thrombus. This may suggest an embolic source as a cause of the patient's right MCA occlusion. This was discussed by telephone at the time of interpretation on 01/14/2016 at 5:02 pm to Dr. Shon Hale, who verbally acknowledged these results. 2. No evidence of significant acute traumatic injury to the chest, abdomen or pelvis. 3. The appearance of the lungs suggests mild congestive heart failure, with mild interstitial pulmonary edema and trace bilateral pleural effusions. 4. 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 Whether this is chronic (favored), or sequela of large embolic event is uncertain. 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 Celiac axis and SMA are both patent. 5. Area in the spleen which is likely related to prior splenic infarct, suggesting prior embolic event. 6. Cardiomegaly with left ventricular and left atrial dilatation. 7. Aortic atherosclerosis, in addition to 3 vessel coronary artery disease. Please note that although the presence of coronary artery calcium  documents the presence of coronary artery disease, the severity of this disease and any potential stenosis cannot be assessed on this non-gated CT examination. Assessment for potential risk factor modification, dietary therapy or pharmacologic therapy may be warranted, if clinically indicated. 8. Additional incidental findings, as above. These results were called by telephone at the time of interpretation on 01/14/2016 at 5:26 pm to Dr. Vira Blanco , who verbally acknowledged these results. Electronically Signed   By: Vinnie Langton M.D.   On: 01/14/2016 17:28   Ct Maxillofacial Wo Cm  Result Date: 01/14/2016 CLINICAL DATA:  70 year old male with history of left-sided weakness and right-sided gaze. Code stroke. EXAM: CT HEAD WITHOUT CONTRAST CT MAXILLOFACIAL WITHOUT CONTRAST CT CERVICAL SPINE WITHOUT CONTRAST TECHNIQUE: Multidetector CT imaging of the head, cervical spine, and maxillofacial structures were performed using the standard protocol without intravenous contrast. Multiplanar CT image reconstructions of the cervical spine and maxillofacial structures were also generated. COMPARISON:  No priors. FINDINGS: CT HEAD FINDINGS Loss of gray-white differentiation in the right frontal and parietal lobes, suggesting subacute ischemia in the right MCA distribution. Right MCA is hyperdense. There is well-defined low-attenuation in the left parietal and occipital regions, compatible with encephalomalacia from remote left-sided infarct. Physiologic calcifications are noted in the basal ganglia bilaterally. No signs of acute intracranial hemorrhage. No mass, mass effect, hydrocephalus or abnormal intra or extra-axial fluid collections. Visualized paranasal sinuses and mastoids are well pneumatized. CT MAXILLOFACIAL FINDINGS No acute displaced facial bone fractures. Specifically, pterygoid plates are intact. Mandible is intact, and mandibular condyles are located  bilaterally. Bilateral globes and retro-orbital soft  tissues are grossly normal in appearance. CT CERVICAL SPINE FINDINGS No acute displaced cervical spine fractures. Alignment is anatomic. Prevertebral soft tissues are normal. Mild emphysematous changes are noted in the visualized portions of the lung apices. There is some patchy ground-glass attenuation and septal thickening in the lung apices. IMPRESSION: 1. Hyperdense right MCA indicative of thrombus, with evidence of subacute ischemia in the right MCA distribution, as above. 2. No evidence of significant acute traumatic injury to the facial bones or cervical spine. Critical Value/emergent results were called by telephone at the time of interpretation on 01/14/2016 at 4:34 pm to Dr. Shon [place], who verbally acknowledged these results. Electronically Signed   By: Vinnie Langton M.D.   On: 01/14/2016 16:54    Procedures Procedures (including critical care time)  Medications Ordered in ED Medications  sodium chloride 0.9 % bolus 1,000 mL (not administered)    And  0.9 %  sodium chloride infusion (not administered)   stroke: mapping our early stages of recovery book (not administered)  0.9 %  sodium chloride infusion (not administered)  aspirin suppository 300 mg (not administered)    Or  aspirin tablet 325 mg (not administered)  0.9 %  sodium chloride infusion (1,000 mLs Intravenous New Bag/Given 01/14/16 1608)  iopamidol (ISOVUE-300) 61 % injection (80 mLs  Contrast Given 01/14/16 1637)     Initial Impression / Assessment and Plan / ED Course  I have reviewed the triage vital signs and the nursing notes.  Pertinent labs & imaging results that were available during my care of the patient were reviewed by me and considered in my medical decision making (see chart for details).  Clinical Course    Patient is a 70 year old male who presents via EMS for evaluation and treatment of potential trauma. Patient was reportedly picked up last evening on the side of the road by cops and taken to jail  overnight. He was reportedly signed out of jail at 2 PM and placed on a bench outside. Shortly thereafter patient was found lying on the ground. It is unclear what happened. Blood sugar normal for EMS, vitals unremarkable.  On arrival patient with rightward gaze, left upper extremity was flaccid. Secondary exam revealed weakness in the left upper and left lower extremity. Laceration to left eyebrow, small and hemostatic. No C, T, L-spine tenderness.  Patient maintaining airway. Due to rightward gaze, left upper extremity weakness, code stroke activated in conjunction with trauma scans.  Patient found to have hyperdense right MCA infarct, neurology at bedside, will admit.  Left ventricle with thrombus on CT scan, likely source of embolic.  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 No intestinal ischemia.  Patient admitted to neuro ICU.   Discussed with my attending, Dr. Roxanne Mins.    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

## 2016-01-14 NOTE — ED Notes (Signed)
Confirmed with Network engineer, [place], that Code Stroke was activated.

## 2016-01-15 ENCOUNTER — Inpatient Hospital Stay (HOSPITAL_COMMUNITY): Payer: Medicare Other

## 2016-01-15 ENCOUNTER — Encounter (HOSPITAL_COMMUNITY): Payer: Self-pay | Admitting: *Deleted

## 2016-01-15 DIAGNOSIS — I6789 Other cerebrovascular disease: Secondary | ICD-10-CM

## 2016-01-15 DIAGNOSIS — N179 Acute kidney failure, unspecified: Secondary | ICD-10-CM

## 2016-01-15 LAB — ECHOCARDIOGRAM COMPLETE
FS: 19 % — AB (ref 28–44)
Height: 69 in
IV/PV OW: 0.71
LA diam end sys: 40 mm
LA vol index: 57.3 mL/m2
LA vol: 91.7 mL
LADIAMINDEX: 2.5 cm/m2
LASIZE: 40 mm
LAVOLA4C: 63.4 mL
LDCA: 3.14 cm2
LVOTD: 20 mm
PW: 8.34 mm — AB (ref 0.6–1.1)
RV TAPSE: 11.3 mm
Weight: 1756.63 oz

## 2016-01-15 LAB — SODIUM
Sodium: 140 mmol/L (ref 135–145)
Sodium: 142 mmol/L (ref 135–145)

## 2016-01-15 LAB — LIPID PANEL
CHOLESTEROL: 104 mg/dL (ref 0–200)
HDL: 31 mg/dL — ABNORMAL LOW (ref >40)
LDL CALC: 64 mg/dL (ref 0–99)
TRIGLYCERIDES: 46 mg/dL (ref <150)
Total CHOL/HDL Ratio: 3.4 RATIO
VLDL: 9 mg/dL (ref 0–40)

## 2016-01-15 MED ORDER — HYDROMORPHONE HCL 1 MG/ML IJ SOLN
2.0000 mg | INTRAMUSCULAR | Status: DC | PRN
  Administered 2016-01-15: 2 mg via INTRAVENOUS
  Administered 2016-01-16: 1 mg via INTRAVENOUS
  Administered 2016-01-18: 2 mg via INTRAVENOUS
  Filled 2016-01-15 (×5): qty 2

## 2016-01-15 MED ORDER — IOPAMIDOL (ISOVUE-370) INJECTION 76%
INTRAVENOUS | Status: AC
  Administered 2016-01-15: 100 mL
  Filled 2016-01-15: qty 50

## 2016-01-15 MED ORDER — MORPHINE SULFATE (PF) 2 MG/ML IV SOLN
1.0000 mg | INTRAVENOUS | Status: DC | PRN
  Administered 2016-01-15 (×2): 1 mg via INTRAVENOUS
  Filled 2016-01-15 (×3): qty 1

## 2016-01-15 MED ORDER — SODIUM CHLORIDE 3 % IV SOLN
INTRAVENOUS | Status: DC
  Administered 2016-01-15 – 2016-01-16 (×3): 70 mL/h via INTRAVENOUS
  Administered 2016-01-17 (×2): 60 mL/h via INTRAVENOUS
  Filled 2016-01-15 (×11): qty 500

## 2016-01-15 MED ORDER — RESOURCE THICKENUP CLEAR PO POWD
ORAL | Status: DC | PRN
  Filled 2016-01-15: qty 125

## 2016-01-15 MED ORDER — SODIUM CHLORIDE 0.9 % IV SOLN
INTRAVENOUS | Status: DC
Start: 2016-01-15 — End: 2016-01-22
  Administered 2016-01-21: 11:00:00 via INTRAVENOUS

## 2016-01-15 MED ORDER — MORPHINE SULFATE (PF) 2 MG/ML IV SOLN
1.0000 mg | INTRAVENOUS | Status: DC | PRN
  Administered 2016-01-15: 1 mg via INTRAVENOUS
  Filled 2016-01-15: qty 1

## 2016-01-15 NOTE — Progress Notes (Signed)
PT Cancellation Note  Patient Details Name: [name] MRN: [id number] DOB: 11/28/1945   Cancelled Treatment:    Reason Eval/Treat Not Completed: Patient not medically ready. Pt remains on strict bedrest. Once patient cleared for OOB mobility please update orders and PT with perform evaluation as able. Thank you for this referral.   Kingsley Callander 01/15/2016, 6:59 AM   Kittie Plater, PT, DPT Pager #: [phone] Office #: [phone]

## 2016-01-15 NOTE — Progress Notes (Signed)
  Echocardiogram 2D Echocardiogram has been performed.  [name] 01/15/2016, 12:11 PM

## 2016-01-15 NOTE — Progress Notes (Signed)
Junior Progress Note Patient Name: [name] DOB: [birth year] MRN: [id number]   Date of Service  01/15/2016  HPI/Events of Note  Called to review CXR for L IJ CVL placement. L IJ CVL tip in distal SVC at R atrial junction. No pneumothorax.   eICU Interventions  OK to use L IJ CVL for medications and fluids.      Intervention Category Intermediate Interventions: Diagnostic test evaluation  Airiana Elman Eugene 01/15/2016, 7:59 PM

## 2016-01-15 NOTE — Procedures (Signed)
Central Venous Catheter Insertion Procedure Note [place] [id number] [birth year]  Procedure: Insertion of Central Venous Catheter Indications: Assessment of intravascular volume  Procedure Details Consent: Risks of procedure as well as the alternatives and risks of each were explained to the (patient/caregiver).  Consent for procedure obtained. Time Out: Verified patient identification, verified procedure, site/side was marked, verified correct patient position, special equipment/implants available, medications/allergies/relevent history reviewed, required imaging and test results available.  Performed  Maximum sterile technique was used including antiseptics, cap, gloves, gown, hand hygiene, mask and sheet. Skin prep: Chlorhexidine; local anesthetic administered A antimicrobial bonded/coated triple lumen catheter was placed in the left internal jugular vein using the Seldinger technique. Ultrasound guidance used.Yes.   Catheter placed to 20 cm. Blood aspirated via all 3 ports and then flushed x 3. Line sutured x 2 and dressing applied.  Evaluation Blood flow good Complications: No apparent complications Patient did tolerate procedure well. Chest X-ray ordered to verify placement.  CXR: pending.  Georgann Housekeeper, AGACNP-BC [facility] Pulmonology/Critical Care Pager [phone] or [phone]  01/15/2016 7:12 PM

## 2016-01-15 NOTE — Progress Notes (Signed)
OT Cancellation Note  Patient Details Name: [name] MRN: [id number] DOB: 1945-06-21   Cancelled Treatment:    Reason Eval/Treat Not Completed: Patient not medically ready (bedrest with bradycardia) Please update orders as appropriate. Ot to hold at this time  Peri Maris  [id number] 01/15/2016, 6:59 AM

## 2016-01-15 NOTE — Progress Notes (Addendum)
STROKE TEAM PROGRESS NOTE   HISTORY OF PRESENT ILLNESS (per record) This is a 70-yo man who was in jail last night and by report, he was released this afternoon. Per ED RN, EMS reported that he got into a "scuffle" with officers at the jail. They reportedly carried him out to a bench. He fell off of the bench and EMS was activated. They arrived to find the patient with left-sided weakness and abrasions over his face. He was initially thought to be a trauma but on arrival to ED the attending called [place]. The patient is not complaining of anything at this time apart from wanting something to eat. No further information is available at this time.  He was last seen normal: 1430 on 01/14/2016. NIHSS score: 19. Patient was not administered IV t-PA or considered for intervention secondary to  Large subacute stroke seen on CT, ASPECTS score 2. He was admitted to the neuro ICU for further evaluation and treatment.   SUBJECTIVE (INTERVAL HISTORY) His RN is at the bedside.  He is lying in the bed. He has collar on from the fall. He has lesions over his lower extremities, he says, secondary to "piss ants". He complains they are itching and has scratched several into wounds.    OBJECTIVE Temp:  [97.4 F (36.3 C)-99.3 F (37.4 C)] 98.4 F (36.9 C) (08/07 0400) Pulse Rate:  [48-154] 154 (08/07 0900) Cardiac Rhythm: Atrial fibrillation (08/07 0800) Resp:  [10-32] 29 (08/07 0900) BP: (111-180)/(59-103) 170/76 (08/07 0900) SpO2:  [87 %-100 %] 100 % (08/07 0900) Weight:  [40.8 kg (90 lb)-49.8 kg (109 lb 12.6 oz)] 49.8 kg (109 lb 12.6 oz) (08/06 1800)  CBC:   Recent Labs Lab 01/14/16 1602 01/14/16 1612  WBC 9.7  --   HGB 11.5* 12.9*  HCT 38.0* 38.0*  MCV 96.7  --   PLT 209  --     Basic Metabolic Panel:   Recent Labs Lab 01/14/16 1602 01/14/16 1612  NA 138 140  K 4.5 4.5  CL 109 108  CO2 21*  --   GLUCOSE 150* 146*  BUN 18 24*  CREATININE 0.77 0.60*  CALCIUM 9.0  --     Lipid  Panel:     Component Value Date/Time   CHOL 104 01/15/2016 0405   TRIG 46 01/15/2016 0405   HDL 31 (L) 01/15/2016 0405   CHOLHDL 3.4 01/15/2016 0405   VLDL 9 01/15/2016 0405   LDLCALC 64 01/15/2016 0405   HgbA1c: No results found for: HGBA1C Urine Drug Screen: No results found for: LABOPIA, COCAINSCRNUR, LABBENZ, AMPHETMU, THCU, LABBARB    IMAGING  Ct Head Wo Contrast  Result Date: 01/14/2016 CLINICAL DATA:  70 year old male with history of left-sided weakness and right-sided gaze. Code stroke. EXAM: CT HEAD WITHOUT CONTRAST CT MAXILLOFACIAL WITHOUT CONTRAST CT CERVICAL SPINE WITHOUT CONTRAST TECHNIQUE: Multidetector CT imaging of the head, cervical spine, and maxillofacial structures were performed using the standard protocol without intravenous contrast. Multiplanar CT image reconstructions of the cervical spine and maxillofacial structures were also generated. COMPARISON:  No priors. FINDINGS: CT HEAD FINDINGS Loss of gray-white differentiation in the right frontal and parietal lobes, suggesting subacute ischemia in the right MCA distribution. Right MCA is hyperdense. There is well-defined low-attenuation in the left parietal and occipital regions, compatible with encephalomalacia from remote left-sided infarct. Physiologic calcifications are noted in the basal ganglia bilaterally. No signs of acute intracranial hemorrhage. No mass, mass effect, hydrocephalus or abnormal intra or extra-axial fluid collections. Visualized  paranasal sinuses and mastoids are well pneumatized. CT MAXILLOFACIAL FINDINGS No acute displaced facial bone fractures. Specifically, pterygoid plates are intact. Mandible is intact, and mandibular condyles are located bilaterally. Bilateral globes and retro-orbital soft tissues are grossly normal in appearance. CT CERVICAL SPINE FINDINGS No acute displaced cervical spine fractures. Alignment is anatomic. Prevertebral soft tissues are normal. Mild emphysematous changes are noted  in the visualized portions of the lung apices. There is some patchy ground-glass attenuation and septal thickening in the lung apices. IMPRESSION: 1. Hyperdense right MCA indicative of thrombus, with evidence of subacute ischemia in the right MCA distribution, as above. 2. No evidence of significant acute traumatic injury to the facial bones or cervical spine. Critical Value/emergent results were called by telephone at the time of interpretation on 01/14/2016 at 4:34 pm to Dr. Shon Hale, who verbally acknowledged these results. Electronically Signed   By: Vinnie Langton M.D.   On: 01/14/2016 16:54   Ct Chest W Contrast  Result Date: 01/14/2016 CLINICAL DATA:  70 year old male with history of trauma from a fall. Found down. Right-sided gaze in left-sided weakness. Left-sided facial abrasions. EXAM: CT CHEST, ABDOMEN, AND PELVIS WITH CONTRAST TECHNIQUE: Multidetector CT imaging of the chest, abdomen and pelvis was performed following the standard protocol during bolus administration of intravenous contrast. CONTRAST:  43mL ISOVUE-300 IOPAMIDOL (ISOVUE-300) INJECTION 61% COMPARISON:  No priors. FINDINGS: CT CHEST FINDINGS Cardiovascular: Heart size is mildly enlarged with mild left atrial and left ventricular dilatation. Small eccentric filling defect in the inferior aspect of the apex of the left ventricle (axial image 54 of series 201, coronal image 57 of series 203, and sagittal image 113 of series 204), highly concerning for small apical thrombus. There is no significant pericardial fluid, thickening or pericardial calcification. There is aortic atherosclerosis, as well as atherosclerosis of the great vessels of the mediastinum and the coronary arteries, including calcified atherosclerotic plaque in the left anterior descending, ramus intermedius and right coronary arteries. Right-sided pacemaker device with lead tips terminating in the right atrial appendage in near the right ventricular apex. Mediastinum/Nodes:  Mildly enlarged low right paratracheal lymph node measuring up to 14 mm in short axis. Esophagus is unremarkable in appearance. No axillary lymphadenopathy. Lungs/Pleura: Scattered areas of mild ground-glass attenuation and septal thickening in the lungs bilaterally, most evident in the posterior aspect of the apices of the lungs, potentially related to mild interstitial pulmonary edema. Trace bilateral pleural effusions. Mild centrilobular emphysema. Musculoskeletal: There are no acute displaced fractures or aggressive appearing lytic or blastic lesions noted in the visualized portions of the skeleton. CT ABDOMEN AND PELVIS FINDINGS Hepatobiliary: No suspicious cystic or solid hepatic lesions. No intra or extrahepatic biliary ductal dilatation. Status post cholecystectomy. Pancreas: No pancreatic mass. No pancreatic ductal dilatation. No pancreatic or peripancreatic fluid or inflammatory changes. Spleen: Focal contour abnormality of the spleen where there is a peripheral low to intermediate attenuation fluid collection, likely sequela of remote splenic infarct. No finding to suggest acute traumatic injury to the spleen. Adrenals/Urinary Tract: Mild thickening of the adrenal glands bilaterally, likely related to adrenal hyperplasia. Multifocal cortical thinning in the kidneys bilaterally. 2.1 cm low-attenuation lesion in the upper pole of the right kidney is compatible with a simple cyst. No hydroureteronephrosis. Urinary bladder is normal in appearance. Stomach/Bowel: Normal appearance of the stomach. No pathologic dilatation of small bowel or colon. Status post subtotal colectomy. Vascular/Lymphatic: Extensive aortic atherosclerosis. 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 Common iliac  arteries, internal iliac arteries and external iliac arteries are completely occluded bilaterally. There is collateral flow providing perfusion and branches of the  internal iliac arteries and the common femoral arteries (and branches) bilaterally. Superior mesenteric artery and celiac axis are both patent, as are the renal arteries bilaterally. Inferior mesenteric artery appears occluded. No lymphadenopathy noted in the abdomen or pelvis. Reproductive: Prostate gland and seminal vesicles are unremarkable in appearance. Other: No significant volume of ascites.  No pneumoperitoneum. Musculoskeletal: No acute displaced fractures or aggressive appearing lytic or blastic lesions are noted in the visualized portions of the skeleton. Postoperative changes of ORIF in the right femur are incompletely visualized. IMPRESSION: 1. Filling defect in the apex of the left ventricle highly concerning for thrombus. This may suggest an embolic source as a cause of the patient's right MCA occlusion. This was discussed by telephone at the time of interpretation on 01/14/2016 at 5:02 pm to Dr. Shon Hale, who verbally acknowledged these results. 2. No evidence of significant acute traumatic injury to the chest, abdomen or pelvis. 3. The appearance of the lungs suggests mild congestive heart failure, with mild interstitial pulmonary edema and trace bilateral pleural effusions. 4. 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 Whether this is chronic (favored), or sequela of large embolic event is uncertain. 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 Celiac axis and SMA are both patent. 5. Area in the spleen which is likely related to prior splenic infarct, suggesting prior embolic event. 6. Cardiomegaly with left ventricular and left atrial dilatation. 7. Aortic atherosclerosis, in addition to 3 vessel coronary artery disease. Please note that although the presence of coronary artery calcium documents the presence of coronary artery disease, the severity of this disease and any potential stenosis cannot be assessed on this  non-gated CT examination. Assessment for potential risk factor modification, dietary therapy or pharmacologic therapy may be warranted, if clinically indicated. 8. Additional incidental findings, as above. These results were called by telephone at the time of interpretation on 01/14/2016 at 5:26 pm to Dr. Vira Blanco , who verbally acknowledged these results. Electronically Signed   By: Vinnie Langton M.D.   On: 01/14/2016 17:28   Ct Cervical Spine Wo Contrast  Result Date: 01/14/2016 CLINICAL DATA:  70 year old male with history of left-sided weakness and right-sided gaze. Code stroke. EXAM: CT HEAD WITHOUT CONTRAST CT MAXILLOFACIAL WITHOUT CONTRAST CT CERVICAL SPINE WITHOUT CONTRAST TECHNIQUE: Multidetector CT imaging of the head, cervical spine, and maxillofacial structures were performed using the standard protocol without intravenous contrast. Multiplanar CT image reconstructions of the cervical spine and maxillofacial structures were also generated. COMPARISON:  No priors. FINDINGS: CT HEAD FINDINGS Loss of gray-white differentiation in the right frontal and parietal lobes, suggesting subacute ischemia in the right MCA distribution. Right MCA is hyperdense. There is well-defined low-attenuation in the left parietal and occipital regions, compatible with encephalomalacia from remote left-sided infarct. Physiologic calcifications are noted in the basal ganglia bilaterally. No signs of acute intracranial hemorrhage. No mass, mass effect, hydrocephalus or abnormal intra or extra-axial fluid collections. Visualized paranasal sinuses and mastoids are well pneumatized. CT MAXILLOFACIAL FINDINGS No acute displaced facial bone fractures. Specifically, pterygoid plates are intact. Mandible is intact, and mandibular condyles are located bilaterally. Bilateral globes and retro-orbital soft tissues are grossly normal in appearance. CT CERVICAL SPINE FINDINGS No acute displaced cervical spine fractures. Alignment is  anatomic. Prevertebral soft tissues are normal. Mild emphysematous changes are noted in  the visualized portions of the lung apices. There is some patchy ground-glass attenuation and septal thickening in the lung apices. IMPRESSION: 1. Hyperdense right MCA indicative of thrombus, with evidence of subacute ischemia in the right MCA distribution, as above. 2. No evidence of significant acute traumatic injury to the facial bones or cervical spine. Critical Value/emergent results were called by telephone at the time of interpretation on 01/14/2016 at 4:34 pm to Dr. Shon Hale, who verbally acknowledged these results. Electronically Signed   By: Vinnie Langton M.D.   On: 01/14/2016 16:54   Ct Abdomen Pelvis W Contrast  Result Date: 01/14/2016 CLINICAL DATA:  70 year old male with history of trauma from a fall. Found down. Right-sided gaze in left-sided weakness. Left-sided facial abrasions. EXAM: CT CHEST, ABDOMEN, AND PELVIS WITH CONTRAST TECHNIQUE: Multidetector CT imaging of the chest, abdomen and pelvis was performed following the standard protocol during bolus administration of intravenous contrast. CONTRAST:  33mL ISOVUE-300 IOPAMIDOL (ISOVUE-300) INJECTION 61% COMPARISON:  No priors. FINDINGS: CT CHEST FINDINGS Cardiovascular: Heart size is mildly enlarged with mild left atrial and left ventricular dilatation. Small eccentric filling defect in the inferior aspect of the apex of the left ventricle (axial image 54 of series 201, coronal image 57 of series 203, and sagittal image 113 of series 204), highly concerning for small apical thrombus. There is no significant pericardial fluid, thickening or pericardial calcification. There is aortic atherosclerosis, as well as atherosclerosis of the great vessels of the mediastinum and the coronary arteries, including calcified atherosclerotic plaque in the left anterior descending, ramus intermedius and right coronary arteries. Right-sided pacemaker device with lead tips  terminating in the right atrial appendage in near the right ventricular apex. Mediastinum/Nodes: Mildly enlarged low right paratracheal lymph node measuring up to 14 mm in short axis. Esophagus is unremarkable in appearance. No axillary lymphadenopathy. Lungs/Pleura: Scattered areas of mild ground-glass attenuation and septal thickening in the lungs bilaterally, most evident in the posterior aspect of the apices of the lungs, potentially related to mild interstitial pulmonary edema. Trace bilateral pleural effusions. Mild centrilobular emphysema. Musculoskeletal: There are no acute displaced fractures or aggressive appearing lytic or blastic lesions noted in the visualized portions of the skeleton. CT ABDOMEN AND PELVIS FINDINGS Hepatobiliary: No suspicious cystic or solid hepatic lesions. No intra or extrahepatic biliary ductal dilatation. Status post cholecystectomy. Pancreas: No pancreatic mass. No pancreatic ductal dilatation. No pancreatic or peripancreatic fluid or inflammatory changes. Spleen: Focal contour abnormality of the spleen where there is a peripheral low to intermediate attenuation fluid collection, likely sequela of remote splenic infarct. No finding to suggest acute traumatic injury to the spleen. Adrenals/Urinary Tract: Mild thickening of the adrenal glands bilaterally, likely related to adrenal hyperplasia. Multifocal cortical thinning in the kidneys bilaterally. 2.1 cm low-attenuation lesion in the upper pole of the right kidney is compatible with a simple cyst. No hydroureteronephrosis. Urinary bladder is normal in appearance. Stomach/Bowel: Normal appearance of the stomach. No pathologic dilatation of small bowel or colon. Status post subtotal colectomy. Vascular/Lymphatic: Extensive aortic atherosclerosis. 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 Common iliac arteries, internal iliac arteries and external iliac arteries are  completely occluded bilaterally. There is collateral flow providing perfusion and branches of the internal iliac arteries and the common femoral arteries (and branches) bilaterally. Superior mesenteric artery and celiac axis are both patent, as are the renal arteries bilaterally. Inferior mesenteric artery appears occluded. No lymphadenopathy noted in the abdomen or pelvis. Reproductive: Prostate gland  and seminal vesicles are unremarkable in appearance. Other: No significant volume of ascites.  No pneumoperitoneum. Musculoskeletal: No acute displaced fractures or aggressive appearing lytic or blastic lesions are noted in the visualized portions of the skeleton. Postoperative changes of ORIF in the right femur are incompletely visualized. IMPRESSION: 1. Filling defect in the apex of the left ventricle highly concerning for thrombus. This may suggest an embolic source as a cause of the patient's right MCA occlusion. This was discussed by telephone at the time of interpretation on 01/14/2016 at 5:02 pm to Dr. Shon Hale, who verbally acknowledged these results. 2. No evidence of significant acute traumatic injury to the chest, abdomen or pelvis. 3. The appearance of the lungs suggests mild congestive heart failure, with mild interstitial pulmonary edema and trace bilateral pleural effusions. 4. 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 Whether this is chronic (favored), or sequela of large embolic event is uncertain. 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 Celiac axis and SMA are both patent. 5. Area in the spleen which is likely related to prior splenic infarct, suggesting prior embolic event. 6. Cardiomegaly with left ventricular and left atrial dilatation. 7. Aortic atherosclerosis, in addition to 3 vessel coronary artery disease. Please note that although the presence of coronary artery calcium documents the presence of coronary artery  disease, the severity of this disease and any potential stenosis cannot be assessed on this non-gated CT examination. Assessment for potential risk factor modification, dietary therapy or pharmacologic therapy may be warranted, if clinically indicated. 8. Additional incidental findings, as above. These results were called by telephone at the time of interpretation on 01/14/2016 at 5:26 pm to Dr. Vira Blanco , who verbally acknowledged these results. Electronically Signed   By: Vinnie Langton M.D.   On: 01/14/2016 17:28   Ct Maxillofacial Wo Cm  Result Date: 01/14/2016 CLINICAL DATA:  70 year old male with history of left-sided weakness and right-sided gaze. Code stroke. EXAM: CT HEAD WITHOUT CONTRAST CT MAXILLOFACIAL WITHOUT CONTRAST CT CERVICAL SPINE WITHOUT CONTRAST TECHNIQUE: Multidetector CT imaging of the head, cervical spine, and maxillofacial structures were performed using the standard protocol without intravenous contrast. Multiplanar CT image reconstructions of the cervical spine and maxillofacial structures were also generated. COMPARISON:  No priors. FINDINGS: CT HEAD FINDINGS Loss of gray-white differentiation in the right frontal and parietal lobes, suggesting subacute ischemia in the right MCA distribution. Right MCA is hyperdense. There is well-defined low-attenuation in the left parietal and occipital regions, compatible with encephalomalacia from remote left-sided infarct. Physiologic calcifications are noted in the basal ganglia bilaterally. No signs of acute intracranial hemorrhage. No mass, mass effect, hydrocephalus or abnormal intra or extra-axial fluid collections. Visualized paranasal sinuses and mastoids are well pneumatized. CT MAXILLOFACIAL FINDINGS No acute displaced facial bone fractures. Specifically, pterygoid plates are intact. Mandible is intact, and mandibular condyles are located bilaterally. Bilateral globes and retro-orbital soft tissues are grossly normal in appearance. CT  CERVICAL SPINE FINDINGS No acute displaced cervical spine fractures. Alignment is anatomic. Prevertebral soft tissues are normal. Mild emphysematous changes are noted in the visualized portions of the lung apices. There is some patchy ground-glass attenuation and septal thickening in the lung apices. IMPRESSION: 1. Hyperdense right MCA indicative of thrombus, with evidence of subacute ischemia in the right MCA distribution, as above. 2. No evidence of significant acute traumatic injury to the facial bones or cervical spine. Critical Value/emergent results were called by telephone at the time of  interpretation on 01/14/2016 at 4:34 pm to Dr. Shon Hale, who verbally acknowledged these results. Electronically Signed   By: Vinnie Langton M.D.   On: 01/14/2016 16:54    PHYSICAL EXAM Frail middle-aged [name] currently not in distress. . Afebrile. Head is nontraumatic. Neck is supple without bruit.    Cardiac exam no murmur or gallop. Lungs are clear to auscultation. Distal pulses are well felt.left leg wounds likely scratch marks at insect bite sites. Neurological Exam :  Awake alert follows commands well. Mild dysarthria but can be understood. Right gaze preference but can look to the left past midline but not all the way. Mild left visual neglect versus hemianopsia. Moderate left lower facial weakness. Tongue midline. Dense left hemiplegia with mild left hemi-neglect and sensory loss. Purposeful antigravity movements on the right. Mild withdrawal of the left lower extremity to pain but no movement in the left upper extremity. Deep tendon reflexes are normal on the right and depressed on the left. Left plantar upgoing right downgoing. ASSESSMENT/PLAN [name] is a 70 y.o. male with history of AFib, non-hodgkins lymphoma, GIB HLD, HTN and tubular adenoma presenting after a fall off a park bench with L sided weakness . He did not receive IV t-PA or OR due to high ASPECTS score.   Stroke:  Non-dominant  large Right MCA infarct in setting of hyperdense R MCA, felt to be embolic secondary to known atrial fibrillation not on anticoagulation  Resultant  Left hemiparesis, left hemisensory loss, left hemianopsia, dysarthria  Initial CT head hyperdense MCA sign  CT Cervical spine no significant traumatic injury to the face or cervical spine  MRI  / MRA  pacemaker  CTA head and neck ordered  Carotid Doppler  canceled  2D Echo  pending   LDL 64  HgbA1c pending  SCDs for VTE prophylaxis Diet NPO time specified  aspirin 81 mg daily prior to admission, now on aspirin 325 mg daily  Patient counseled to be compliant with his antithrombotic medications  Ongoing aggressive stroke risk factor management  Therapy recommendations:  Pending. Given large stroke, keep in bed today. May be out of bed in AM if stable.  Disposition:  pending (recently discharged from jail, no family, reportedly has a girlfriend)  Atrial Fibrillation  Home anticoagulation:  none   Likely, not a good anticoagulation candidate secondary to social situation and compliance  Hypertension  Stable  Permissive hypertension (OK if < 220/120) but gradually normalize in 5-7 days  Long-term BP goal normotensive  Hyperlipidemia  Hx of HLD but not on home meds:  No statin  LDL 65, at goal < 70  Hyperglycemia  No documented hx  Elevated 146, 150  HgbA1c pending   Other Stroke Risk Factors  Advanced age  Current every day Cigarette smoker, advised to stop smoking  ETOH use, advised to drink no more than 2 drink(s) a day  Marijuana use, UDS not done this admission   Other Active Problems  History non-Hodgkin's lymphoma  History of GI bleeding  History tubular adenoma  History of complete heart block  Lower extremities leg wounds - patient states from insect bite - local pruritis, developing wounds. WOC consulted to evaluate, recommend treatment  Check labs in the morning  Hospital day #  [place] for Pager information 01/15/2016 3:25 PM  I have personally examined this patient, reviewed notes, independently viewed imaging studies, participated in medical decision making and plan of care. I  have made any additions or clarifications directly to the above note. Agree with note above.  Patient has presented with a large right middle cerebral artery infarct and is at significant risk for neurological worsening due to cytotoxic edema, brain herniation and likely needs hypertonic saline and probably elective intubation in the near future and ventilatory support. Patient is unable to give any consent and has no family and is currently a full code. He is likely to survive but with major disability. Plan to consult critical care team for central line to start hypertonic saline. Check CT angiogram of the brain later today to look for right carotid occlusion and progression of cytotoxic edema. Wound nurse consult for left leg. This patient is critically ill and at significant risk of neurological worsening, death and care requires constant monitoring of vital signs, hemodynamics,respiratory and cardiac monitoring, extensive review of multiple databases, frequent neurological assessment, discussion with family, other specialists and medical decision making of high complexity.I have made any additions or clarifications directly to the above note.This critical care time does not reflect procedure time, or teaching time or supervisory time of PA/NP/Med Resident etc but could involve care discussion time.  I spent 40 minutes of neurocritical care time  in the care of  this patient.     Antony Contras, MD Medical Director [place] Pager: [phone] 01/15/2016 5:00 PM    To contact Stroke Continuity provider, please refer to [email or web address] After hours, contact General Neurology
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
## 2016-01-16 ENCOUNTER — Inpatient Hospital Stay (HOSPITAL_COMMUNITY): Payer: Medicare Other

## 2016-01-16 DIAGNOSIS — R404 Transient alteration of awareness: Secondary | ICD-10-CM

## 2016-01-16 LAB — BASIC METABOLIC PANEL
ANION GAP: 7 (ref 5–15)
BUN: 20 mg/dL (ref 6–20)
CO2: 22 mmol/L (ref 22–32)
Calcium: 8.7 mg/dL — ABNORMAL LOW (ref 8.9–10.3)
Chloride: 118 mmol/L — ABNORMAL HIGH (ref 101–111)
Creatinine, Ser: 0.61 mg/dL (ref 0.61–1.24)
GLUCOSE: 144 mg/dL — AB (ref 65–99)
POTASSIUM: 5.1 mmol/L (ref 3.5–5.1)
Sodium: 147 mmol/L — ABNORMAL HIGH (ref 135–145)

## 2016-01-16 LAB — CBC
HCT: 37.1 % — ABNORMAL LOW (ref 39.0–52.0)
Hemoglobin: 11.2 g/dL — ABNORMAL LOW (ref 13.0–17.0)
MCH: 29.2 pg (ref 26.0–34.0)
MCHC: 30.2 g/dL (ref 30.0–36.0)
MCV: 96.9 fL (ref 78.0–100.0)
PLATELETS: 196 10*3/uL (ref 150–400)
RBC: 3.83 MIL/uL — AB (ref 4.22–5.81)
RDW: 15.8 % — ABNORMAL HIGH (ref 11.5–15.5)
WBC: 10 10*3/uL (ref 4.0–10.5)

## 2016-01-16 LAB — SODIUM
SODIUM: 151 mmol/L — AB (ref 135–145)
SODIUM: 155 mmol/L — AB (ref 135–145)

## 2016-01-16 LAB — HEMOGLOBIN A1C
HEMOGLOBIN A1C: 6.7 % — AB (ref 4.8–5.6)
Mean Plasma Glucose: 146 mg/dL

## 2016-01-16 MED ORDER — HYDROXYZINE HCL 25 MG PO TABS
25.0000 mg | ORAL_TABLET | Freq: Four times a day (QID) | ORAL | Status: DC | PRN
  Administered 2016-01-16: 25 mg via ORAL
  Filled 2016-01-16: qty 1

## 2016-01-16 NOTE — Progress Notes (Signed)
Pt NTS by RT with copious amount of thick, white and pink tinged secretions. Pt tolerated well, RT will continue to monitor

## 2016-01-16 NOTE — Care Management Note (Signed)
Case Management Note  Patient Details  Name: [name] MRN: [id number] Date of Birth: [birth year]  Subjective/Objective:  Pt admitted on 01/14/16 s/p large RT MCA stroke.  PTA, pt independent and homeless.                   Action/Plan: Will follow for discharge planning as pt progresses.  PT/OT consults pending.  CSW following for homeless issues.    Expected Discharge Date:                  Expected Discharge Plan:  [name]  In-House Referral:     Discharge planning Services  CM Consult  Post Acute Care Choice:    Choice offered to:     DME Arranged:    DME Agency:     HH Arranged:    [place] Agency:     Status of Service:  In process, will continue to follow  If discussed at Long Length of Stay Meetings, dates discussed:    Additional Comments:  Reinaldo Raddle, RN, BSN  Trauma/Neuro ICU Case Manager [phone]

## 2016-01-16 NOTE — Procedures (Signed)
ELECTROENCEPHALOGRAM REPORT  Date of Study: 01/16/2016  Patient's Name: [name] MRN: [id number] Date of Birth: 1945-09-11  Referring Provider: Burnetta Sabin, NP  Clinical History: This is a 70 year old man with right MCA stroke and staring spells.  Medications: aspirin tablet 325 mg  diltiazem (CARDIZEM) 100 mg in dextrose 5 % 100 mL (1 mg/mL) infusion   Technical Summary: A multichannel digital EEG recording measured by the international 10-20 system with electrodes applied with paste and impedances below 5000 ohms performed as portable with EKG monitoring in an awake and drowsy patient.  Hyperventilation and photic stimulation were not performed.  The digital EEG was referentially recorded, reformatted, and digitally filtered in a variety of bipolar and referential montages for optimal display.   Description: The patient is awake and drowsy during the recording. There is no clear posterior dominant rhythm. The background consists of a large amount of diffuse 4-5 Hz theta slowing with occasional focal theta and delta slowing over the left hemisphere. During drowsiness, there is an increase in theta and delta slowing of the background.  Deeper stages of sleep were not seen.  Hyperventilation and photic stimulation were not performed. There were no epileptiform discharges or electrographic seizures seen.    EKG lead showed irregular rhythm.  Impression: This awake and drowsy EEG is abnormal due to the presence of: 1. Mild diffuse slowing of the waking background. 2. Focal slowing over the left hemisphere  Clinical Correlation of the above findings indicates diffuse cerebral dysfunction that is non-specific in etiology and can be seen with hypoxic/ischemic injury, toxic/metabolic encephalopathies, neurodegenerative disorders, or medication effect. Focal slowing over the left hemisphere indicates focal cerebral dysfunction in this region suggestive of underlying structural or physiologic  abnormality. The absence of epileptiform discharges does not rule out a clinical diagnosis of epilepsy.  Clinical correlation is advised.   Ellouise Newer, M.D.

## 2016-01-16 NOTE — Progress Notes (Addendum)
[facility] Nurse wound consult note Reason for Consult:Multiple wounds to legs and multiple bites Wound type: Partial thickness wound(stage 2) left trochanter 2 cm x 1 cm, left knee stable eschar 1 cm x 2 cm, stable eschar to left lateral malleolus 1 cm x 1 cm, stable eschar to left lateral shin 0.5 cm x 0.3 cm, stable eschar to left lower leg 0.5 cm x 0.3 cm, stable eschar to lateral aspect of left knee 0.5 cm x 0.5 cm. Noted multiple insect bites to lower abdomen and left leg. As per patient stated it was from "pissants". Pressure Ulcer POA: Yes to left trochanter Measurement:See above Wound [id number]  Drainage (amount, consistency, odor) Scant serous drainage Periwound:Red but blanchable Dressing procedure/placement/frequency: Cleanse left hip wound with normal saline, pat dry. Cover wound bed with Xeroform gauze and secure with foam dressing. Change every other day. (2) Apply protective foam dressing to left malleolus and left knee. Change prn   Re consult if needed, will not follow at this time. Thanks, Melba Coon MSN, RN, [employer]

## 2016-01-16 NOTE — Progress Notes (Signed)
PT Cancellation Note  Patient Details Name: [name] MRN: [id number] DOB: 08-08-1945   Cancelled Treatment:    Reason Eval/Treat Not Completed: Patient at procedure or test/unavailable (attempted PT eval, pt was having EEG performed, will reattep)   Duncan Dull 01/16/2016, 1:09 PM

## 2016-01-16 NOTE — Progress Notes (Signed)
OT Cancellation Note  Patient Details Name: Lc Oblander MRN: [id number] DOB: [birth year]   Cancelled Treatment:    Reason Eval/Treat Not Completed: Patient not medically ready Pt with continuous EEG   Vonita Moss   OTR/L Pager: [phone] Office: [phone] .  01/16/2016, 1:16 PM

## 2016-01-16 NOTE — Progress Notes (Signed)
EEG completed; results pending.    
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
## 2016-01-16 NOTE — Progress Notes (Signed)
STROKE TEAM PROGRESS NOTE   SUBJECTIVE (INTERVAL HISTORY)  Patient is awake and interactive this morning. He is on hypertonic saline and last serum sodium was 147. Patient has his request and to drink liquids. No family available.   OBJECTIVE Temp:  [97.5 F (36.4 C)-98.3 F (36.8 C)] 98.2 F (36.8 C) (08/08 0800) Pulse Rate:  [25-87] 70 (08/08 0500) Cardiac Rhythm: Atrial fibrillation (08/08 0600) Resp:  [9-28] 12 (08/08 0600) BP: (115-166)/(61-132) 149/101 (08/08 0600) SpO2:  [94 %-100 %] 100 % (08/08 0600)  CBC:   Recent Labs Lab 01/14/16 1602 01/14/16 1612 01/16/16 0440  WBC 9.7  --  10.0  HGB 11.5* 12.9* 11.2*  HCT 38.0* 38.0* 37.1*  MCV 96.7  --  96.9  PLT 209  --  123456    Basic Metabolic Panel:   Recent Labs Lab 01/14/16 1602 01/14/16 1612  01/15/16 2320 01/16/16 0440  NA 138 140  < > 142 147*  K 4.5 4.5  --   --  5.1  CL 109 108  --   --  118*  CO2 21*  --   --   --  22  GLUCOSE 150* 146*  --   --  144*  BUN 18 24*  --   --  20  CREATININE 0.77 0.60*  --   --  0.61  CALCIUM 9.0  --   --   --  8.7*  < > = values in this interval not displayed.  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 Fall yesterday. EXAM: CT ANGIOGRAPHY HEAD AND NECK TECHNIQUE: Multidetector CT imaging of the head and neck was performed using the standard protocol during bolus administration of intravenous contrast. Multiplanar CT image reconstructions and MIPs were obtained to evaluate the vascular  anatomy. Carotid stenosis measurements (when applicable) are obtained utilizing NASCET criteria, using the distal internal carotid diameter as the denominator. CONTRAST:  50 cc Isovue 370 intravenous COMPARISON:  Head CT 01/14/2016 FINDINGS: CTA NECK Aortic arch: Atheromatous changes. No dissection. Three vessel branching Right carotid system: Noncalcified plaque on the brachiocephalic artery without flow limiting stenosis. Asymmetric poor opacification throughout the right carotid circulation due to distal right ICA occlusion. The lower margin of the clot is not confidently identifiable due to graded fading of the contrast bolus. There is noncalcified plaque at the bifurcation with ICA narrowing difficult quantify due to poor opacification and probable underfilling of the downstream vessel. Subjectively stenosis does not appear greater than 50%. Left carotid system: Atheromatous changes without flow limiting stenosis. No dissection or occlusion. Vertebral arteries:No proximal subclavian artery stenosis. Dominant on the right. Mild bilateral ostial stenosis from calcified plaque. No evidence of dissection. Skeleton: No acute or aggressive process Other neck: No acute finding Upper chest: Recent chest CT. There is airway thickening which was also seen on the previous scan, with debris in central airway use and dependent atelectasis. CTA HEAD Anterior circulation: No flow within  the right ICA, large majority of right MCA branches, and right A1 segment. There is an anterior communicating artery with filling of the diminutive right A2 segment. Delayed intravascular enhancement in right MCA branches is noted. No left-sided branch occlusion. Posterior circulation: Dominant right vertebral artery. Dominant bilateral AICA. No major branch occlusion or flow limiting stenosis. Venous sinuses: Patent as permitted by contrast timing Anatomic variants: None significant Delayed phase: Cytotoxic edema throughout the right MCA  territory and in the distal right ACA territory (high parasagittal right frontal lobe). There is effacement of the right lateral ventricle without notable midline shift. Remote left occipital parietal infarct. No hemorrhagic conversion noted. IMPRESSION: 1. 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 Nonhemorrhagic acute infarct throughout the right MCA territory and in the distal right ACA territory. 2. Remote left parietal occipital infarct. 3. Extensive debris in the central airways. Correlate for symptoms of aspiration. Electronically Signed   By: Monte Fantasia M.D.   On: 01/15/2016 16:20   Ct Head Wo Contrast  Result Date: 01/14/2016 CLINICAL DATA:  70 year old male with history of left-sided weakness and right-sided gaze. Code stroke. EXAM: CT HEAD WITHOUT CONTRAST CT MAXILLOFACIAL WITHOUT CONTRAST CT CERVICAL SPINE WITHOUT CONTRAST TECHNIQUE: Multidetector CT imaging of the head, cervical spine, and maxillofacial structures were performed using the standard protocol without intravenous contrast. Multiplanar CT image reconstructions of the cervical spine and maxillofacial structures were also generated. COMPARISON:  No priors. FINDINGS: CT HEAD FINDINGS Loss of gray-white differentiation in the right frontal and parietal lobes, suggesting subacute ischemia in the right MCA distribution. Right MCA is hyperdense. There is well-defined low-attenuation in the left parietal and occipital regions, compatible with encephalomalacia from remote left-sided infarct. Physiologic calcifications are noted in the basal ganglia bilaterally. No signs of acute intracranial hemorrhage. No mass, mass effect, hydrocephalus or abnormal intra or extra-axial fluid collections. Visualized paranasal sinuses and mastoids are well pneumatized. CT MAXILLOFACIAL FINDINGS No acute displaced facial bone fractures. Specifically, pterygoid plates are intact. Mandible is intact, and mandibular condyles are located  bilaterally. Bilateral globes and retro-orbital soft tissues are grossly normal in appearance. CT CERVICAL SPINE FINDINGS No acute displaced cervical spine fractures. Alignment is anatomic. Prevertebral soft tissues are normal. Mild emphysematous changes are noted in the visualized portions of the lung apices. There is some patchy ground-glass attenuation and septal thickening in the lung apices. IMPRESSION: 1. Hyperdense right MCA indicative of thrombus, with evidence of subacute ischemia in the right MCA distribution, as above. 2. No evidence of significant acute traumatic injury to the facial bones or cervical spine. Critical Value/emergent results were called by telephone at the time of interpretation on 01/14/2016 at 4:34 pm to Dr. Shon Hale, who verbally acknowledged these results. Electronically Signed   By: Vinnie Langton M.D.   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 Fall yesterday. EXAM: CT ANGIOGRAPHY HEAD AND NECK TECHNIQUE: Multidetector CT imaging of the head and neck was performed using the standard protocol during bolus administration of intravenous contrast. Multiplanar CT image reconstructions and MIPs were obtained to evaluate the vascular anatomy. Carotid stenosis measurements (when applicable) are obtained utilizing NASCET criteria, using the distal internal carotid diameter as the denominator. CONTRAST:  50 cc Isovue 370 intravenous COMPARISON:  Head CT 01/14/2016 FINDINGS: CTA NECK Aortic arch: Atheromatous changes. No dissection. Three vessel branching Right carotid system: Noncalcified plaque on the  brachiocephalic artery without flow limiting stenosis. Asymmetric poor opacification throughout the right carotid circulation due to distal right ICA occlusion. The lower margin of the clot is not confidently identifiable due to graded fading of the contrast bolus. There is noncalcified  plaque at the bifurcation with ICA narrowing difficult quantify due to poor opacification and probable underfilling of the downstream vessel. Subjectively stenosis does not appear greater than 50%. Left carotid system: Atheromatous changes without flow limiting stenosis. No dissection or occlusion. Vertebral arteries:No proximal subclavian artery stenosis. Dominant on the right. Mild bilateral ostial stenosis from calcified plaque. No evidence of dissection. Skeleton: No acute or aggressive process Other neck: No acute finding Upper chest: Recent chest CT. There is airway thickening which was also seen on the previous scan, with debris in central airway use and dependent atelectasis. CTA HEAD Anterior circulation: No flow within the right ICA, large majority of right MCA branches, and right A1 segment. There is an anterior communicating artery with filling of the diminutive right A2 segment. Delayed intravascular enhancement in right MCA branches is noted. No left-sided branch occlusion. Posterior circulation: Dominant right vertebral artery. Dominant bilateral AICA. No major branch occlusion or flow limiting stenosis. Venous sinuses: Patent as permitted by contrast timing Anatomic variants: None significant Delayed phase: Cytotoxic edema throughout the right MCA territory and in the distal right ACA territory (high parasagittal right frontal lobe). There is effacement of the right lateral ventricle without notable midline shift. Remote left occipital parietal infarct. No hemorrhagic conversion noted. IMPRESSION: 1. 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 Nonhemorrhagic acute infarct throughout the right MCA territory and in the distal right ACA territory. 2. Remote left parietal occipital infarct. 3. Extensive debris in the central airways. Correlate for symptoms of aspiration. Electronically Signed   By: Monte Fantasia M.D.   On: 01/15/2016 16:20   Ct Chest W Contrast  Result Date:  01/14/2016 CLINICAL DATA:  70 year old male with history of trauma from a fall. Found down. Right-sided gaze in left-sided weakness. Left-sided facial abrasions. EXAM: CT CHEST, ABDOMEN, AND PELVIS WITH CONTRAST TECHNIQUE: Multidetector CT imaging of the chest, abdomen and pelvis was performed following the standard protocol during bolus administration of intravenous contrast. CONTRAST:  12mL ISOVUE-300 IOPAMIDOL (ISOVUE-300) INJECTION 61% COMPARISON:  No priors. FINDINGS: CT CHEST FINDINGS Cardiovascular: Heart size is mildly enlarged with mild left atrial and left ventricular dilatation. Small eccentric filling defect in the inferior aspect of the apex of the left ventricle (axial image 54 of series 201, coronal image 57 of series 203, and sagittal image 113 of series 204), highly concerning for small apical thrombus. There is no significant pericardial fluid, thickening or pericardial calcification. There is aortic atherosclerosis, as well as atherosclerosis of the great vessels of the mediastinum and the coronary arteries, including calcified atherosclerotic plaque in the left anterior descending, ramus intermedius and right coronary arteries. Right-sided pacemaker device with lead tips terminating in the right atrial appendage in near the right ventricular apex. Mediastinum/Nodes: Mildly enlarged low right paratracheal lymph node measuring up to 14 mm in short axis. Esophagus is unremarkable in appearance. No axillary lymphadenopathy. Lungs/Pleura: Scattered areas of mild ground-glass attenuation and septal thickening in the lungs bilaterally, most evident in the posterior aspect of the apices of the lungs, potentially related to mild interstitial pulmonary edema. Trace bilateral pleural effusions. Mild centrilobular emphysema. Musculoskeletal: There are no acute displaced fractures or aggressive appearing lytic or blastic lesions noted in the visualized portions of the skeleton.  CT ABDOMEN AND PELVIS FINDINGS  Hepatobiliary: No suspicious cystic or solid hepatic lesions. No intra or extrahepatic biliary ductal dilatation. Status post cholecystectomy. Pancreas: No pancreatic mass. No pancreatic ductal dilatation. No pancreatic or peripancreatic fluid or inflammatory changes. Spleen: Focal contour abnormality of the spleen where there is a peripheral low to intermediate attenuation fluid collection, likely sequela of remote splenic infarct. No finding to suggest acute traumatic injury to the spleen. Adrenals/Urinary Tract: Mild thickening of the adrenal glands bilaterally, likely related to adrenal hyperplasia. Multifocal cortical thinning in the kidneys bilaterally. 2.1 cm low-attenuation lesion in the upper pole of the right kidney is compatible with a simple cyst. No hydroureteronephrosis. Urinary bladder is normal in appearance. Stomach/Bowel: Normal appearance of the stomach. No pathologic dilatation of small bowel or colon. Status post subtotal colectomy. Vascular/Lymphatic: Extensive aortic atherosclerosis. 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 Common iliac arteries, internal iliac arteries and external iliac arteries are completely occluded bilaterally. There is collateral flow providing perfusion and branches of the internal iliac arteries and the common femoral arteries (and branches) bilaterally. Superior mesenteric artery and celiac axis are both patent, as are the renal arteries bilaterally. Inferior mesenteric artery appears occluded. No lymphadenopathy noted in the abdomen or pelvis. Reproductive: Prostate gland and seminal vesicles are unremarkable in appearance. Other: No significant volume of ascites.  No pneumoperitoneum. Musculoskeletal: No acute displaced fractures or aggressive appearing lytic or blastic lesions are noted in the visualized portions of the skeleton. Postoperative changes of ORIF in the right femur are incompletely  visualized. IMPRESSION: 1. Filling defect in the apex of the left ventricle highly concerning for thrombus. This may suggest an embolic source as a cause of the patient's right MCA occlusion. This was discussed by telephone at the time of interpretation on 01/14/2016 at 5:02 pm to Dr. Shon Hale, who verbally acknowledged these results. 2. No evidence of significant acute traumatic injury to the chest, abdomen or pelvis. 3. The appearance of the lungs suggests mild congestive heart failure, with mild interstitial pulmonary edema and trace bilateral pleural effusions. 4. 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 Whether this is chronic (favored), or sequela of large embolic event is uncertain. 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 Celiac axis and SMA are both patent. 5. Area in the spleen which is likely related to prior splenic infarct, suggesting prior embolic event. 6. Cardiomegaly with left ventricular and left atrial dilatation. 7. Aortic atherosclerosis, in addition to 3 vessel coronary artery disease. Please note that although the presence of coronary artery calcium documents the presence of coronary artery disease, the severity of this disease and any potential stenosis cannot be assessed on this non-gated CT examination. Assessment for potential risk factor modification, dietary therapy or pharmacologic therapy may be warranted, if clinically indicated. 8. Additional incidental findings, as above. These results were called by telephone at the time of interpretation on 01/14/2016 at 5:26 pm to Dr. Vira Blanco , who verbally acknowledged these results. Electronically Signed   By: Vinnie Langton M.D.   On: 01/14/2016 17:28   Ct Cervical Spine Wo Contrast  Result Date: 01/14/2016 CLINICAL DATA:  70 year old male with history of left-sided weakness and right-sided gaze. Code stroke. EXAM: CT HEAD WITHOUT CONTRAST CT  MAXILLOFACIAL WITHOUT CONTRAST CT CERVICAL SPINE WITHOUT CONTRAST TECHNIQUE: Multidetector CT imaging of the head, cervical spine, and maxillofacial structures were performed using the standard protocol  without intravenous contrast. Multiplanar CT image reconstructions of the cervical spine and maxillofacial structures were also generated. COMPARISON:  No priors. FINDINGS: CT HEAD FINDINGS Loss of gray-white differentiation in the right frontal and parietal lobes, suggesting subacute ischemia in the right MCA distribution. Right MCA is hyperdense. There is well-defined low-attenuation in the left parietal and occipital regions, compatible with encephalomalacia from remote left-sided infarct. Physiologic calcifications are noted in the basal ganglia bilaterally. No signs of acute intracranial hemorrhage. No mass, mass effect, hydrocephalus or abnormal intra or extra-axial fluid collections. Visualized paranasal sinuses and mastoids are well pneumatized. CT MAXILLOFACIAL FINDINGS No acute displaced facial bone fractures. Specifically, pterygoid plates are intact. Mandible is intact, and mandibular condyles are located bilaterally. Bilateral globes and retro-orbital soft tissues are grossly normal in appearance. CT CERVICAL SPINE FINDINGS No acute displaced cervical spine fractures. Alignment is anatomic. Prevertebral soft tissues are normal. Mild emphysematous changes are noted in the visualized portions of the lung apices. There is some patchy ground-glass attenuation and septal thickening in the lung apices. IMPRESSION: 1. Hyperdense right MCA indicative of thrombus, with evidence of subacute ischemia in the right MCA distribution, as above. 2. No evidence of significant acute traumatic injury to the facial bones or cervical spine. Critical Value/emergent results were called by telephone at the time of interpretation on 01/14/2016 at 4:34 pm to Dr. Shon Hale, who verbally acknowledged these results. Electronically Signed    By: Vinnie Langton M.D.   On: 01/14/2016 16:54   Ct Abdomen Pelvis W Contrast  Result Date: 01/14/2016 CLINICAL DATA:  70 year old male with history of trauma from a fall. Found down. Right-sided gaze in left-sided weakness. Left-sided facial abrasions. EXAM: CT CHEST, ABDOMEN, AND PELVIS WITH CONTRAST TECHNIQUE: Multidetector CT imaging of the chest, abdomen and pelvis was performed following the standard protocol during bolus administration of intravenous contrast. CONTRAST:  84mL ISOVUE-300 IOPAMIDOL (ISOVUE-300) INJECTION 61% COMPARISON:  No priors. FINDINGS: CT CHEST FINDINGS Cardiovascular: Heart size is mildly enlarged with mild left atrial and left ventricular dilatation. Small eccentric filling defect in the inferior aspect of the apex of the left ventricle (axial image 54 of series 201, coronal image 57 of series 203, and sagittal image 113 of series 204), highly concerning for small apical thrombus. There is no significant pericardial fluid, thickening or pericardial calcification. There is aortic atherosclerosis, as well as atherosclerosis of the great vessels of the mediastinum and the coronary arteries, including calcified atherosclerotic plaque in the left anterior descending, ramus intermedius and right coronary arteries. Right-sided pacemaker device with lead tips terminating in the right atrial appendage in near the right ventricular apex. Mediastinum/Nodes: Mildly enlarged low right paratracheal lymph node measuring up to 14 mm in short axis. Esophagus is unremarkable in appearance. No axillary lymphadenopathy. Lungs/Pleura: Scattered areas of mild ground-glass attenuation and septal thickening in the lungs bilaterally, most evident in the posterior aspect of the apices of the lungs, potentially related to mild interstitial pulmonary edema. Trace bilateral pleural effusions. Mild centrilobular emphysema. Musculoskeletal: There are no acute displaced fractures or aggressive appearing lytic or  blastic lesions noted in the visualized portions of the skeleton. CT ABDOMEN AND PELVIS FINDINGS Hepatobiliary: No suspicious cystic or solid hepatic lesions. No intra or extrahepatic biliary ductal dilatation. Status post cholecystectomy. Pancreas: No pancreatic mass. No pancreatic ductal dilatation. No pancreatic or peripancreatic fluid or inflammatory changes. Spleen: Focal contour abnormality of the spleen where there is a peripheral low to intermediate attenuation fluid collection, likely sequela of remote splenic infarct. No  finding to suggest acute traumatic injury to the spleen. Adrenals/Urinary Tract: Mild thickening of the adrenal glands bilaterally, likely related to adrenal hyperplasia. Multifocal cortical thinning in the kidneys bilaterally. 2.1 cm low-attenuation lesion in the upper pole of the right kidney is compatible with a simple cyst. No hydroureteronephrosis. Urinary bladder is normal in appearance. Stomach/Bowel: Normal appearance of the stomach. No pathologic dilatation of small bowel or colon. Status post subtotal colectomy. Vascular/Lymphatic: Extensive aortic atherosclerosis. 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 Common iliac arteries, internal iliac arteries and external iliac arteries are completely occluded bilaterally. There is collateral flow providing perfusion and branches of the internal iliac arteries and the common femoral arteries (and branches) bilaterally. Superior mesenteric artery and celiac axis are both patent, as are the renal arteries bilaterally. Inferior mesenteric artery appears occluded. No lymphadenopathy noted in the abdomen or pelvis. Reproductive: Prostate gland and seminal vesicles are unremarkable in appearance. Other: No significant volume of ascites.  No pneumoperitoneum. Musculoskeletal: No acute displaced fractures or aggressive appearing lytic or blastic lesions are noted in the visualized  portions of the skeleton. Postoperative changes of ORIF in the right femur are incompletely visualized. IMPRESSION: 1. Filling defect in the apex of the left ventricle highly concerning for thrombus. This may suggest an embolic source as a cause of the patient's right MCA occlusion. This was discussed by telephone at the time of interpretation on 01/14/2016 at 5:02 pm to Dr. Shon Hale, who verbally acknowledged these results. 2. No evidence of significant acute traumatic injury to the chest, abdomen or pelvis. 3. The appearance of the lungs suggests mild congestive heart failure, with mild interstitial pulmonary edema and trace bilateral pleural effusions. 4. 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 Whether this is chronic (favored), or sequela of large embolic event is uncertain. 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 Celiac axis and SMA are both patent. 5. Area in the spleen which is likely related to prior splenic infarct, suggesting prior embolic event. 6. Cardiomegaly with left ventricular and left atrial dilatation. 7. Aortic atherosclerosis, in addition to 3 vessel coronary artery disease. Please note that although the presence of coronary artery calcium documents the presence of coronary artery disease, the severity of this disease and any potential stenosis cannot be assessed on this non-gated CT examination. Assessment for potential risk factor modification, dietary therapy or pharmacologic therapy may be warranted, if clinically indicated. 8. Additional incidental findings, as above. These results were called by telephone at the time of interpretation on 01/14/2016 at 5:26 pm to Dr. Vira Blanco , who verbally acknowledged these results. Electronically Signed   By: Vinnie Langton M.D.   On: 01/14/2016 17:28   Dg Chest Port 1 View  Result Date: 01/15/2016 CLINICAL DATA:  Central line placement EXAM: PORTABLE CHEST 1 VIEW  COMPARISON:  None. FINDINGS: Cardiomegaly. Dual lead cardiac pacemaker in place. There is left IJ central line with tip in SVC right atrium junction. No pneumothorax. No infiltrate or pulmonary edema. IMPRESSION: Left IJ central line in place.  No pneumothorax. Electronically Signed   By: Lahoma Crocker M.D.   On: 01/15/2016 19:46   Dg Swallowing Func-speech Pathology  Result Date: 01/15/2016 Objective Swallowing Evaluation: Type of Study: MBS-Modified Barium Swallow Study Patient Details Name: Rustam Squillace MRN: [id number] Date of Birth: 06-03-1946 Today's Date: 01/15/2016 Time: SLP Start Time (ACUTE ONLY): 1245-SLP Stop Time (ACUTE ONLY): 1320  SLP Time Calculation (min) (ACUTE ONLY): 35 min Past Medical History: Past Medical History: Diagnosis Date . A-fib ([place])  . Cancer ([place])   non hodgkins . Complete heart block ([place])  . GIB (gastrointestinal bleeding)  . Hyperlipidemia  . Hypertension  . Tubular adenoma  Past Surgical History: Past Surgical History: Procedure Laterality Date . APPENDECTOMY   . CHOLECYSTECTOMY   . ESOPHAGOGASTRODUODENOSCOPY   . PACEMAKER PLACEMENT   . PARTIAL COLECTOMY   HPI: This is a 69-yo man who was in jail.Pt found to have  EMS reported that he got into a "scuffle" with officers at the jail. They reportedly carried him out to a bench. He fell off of the bench and EMS was activated. They arrived to find the patient with left-sided weakness and abrasions over his face. He was initially thought to be a trauma but on arrival to ED the attending called [facility]. CT shows infarct in right frontal and parietal No Data Recorded Assessment / Plan / Recommendation CHL IP CLINICAL IMPRESSIONS 01/15/2016 Therapy Diagnosis Moderate pharyngeal phase dysphagia;Severe oral phase dysphagia;Severe pharyngeal phase dysphagia;Moderate oral phase dysphagia Clinical Impression Pt presents with moderate to severe oral and oropharyngeal deficits with high risk of aspiration without modified diet and careful feeding.  Oral phase is impaired by CN VII sensorimotor deficits leading to anterior spillafge (severe with self feeding) and left buccal residuals. Swallow initiation is delayed with nectar teaspoon reaching the pyriforms, leading to silent aspiration as swallow is initiated. After aspirate descends trachea pt has weak ineffective cough. Honey and puree textures are tolerated well, even with impulsive over-large cup sips. Given oral deficits and impulsivity the pt needs dys 1 (puree textures) and honey thick liquids via teaspoon or controlled cup sip with full supervision. Will follow for potential for upgrade and congnitive linguistic eval.  Impact on safety and function Severe aspiration risk   CHL IP TREATMENT RECOMMENDATION 01/15/2016 Treatment Recommendations Therapy as outlined in treatment plan below   Prognosis 01/15/2016 Prognosis for Safe Diet Advancement Good Barriers to Reach Goals -- Barriers/Prognosis Comment -- CHL IP DIET RECOMMENDATION 01/15/2016 SLP Diet Recommendations Honey thick liquids;Dysphagia 1 (Puree) solids Liquid Administration via Spoon;Cup Medication Administration Crushed with puree Compensations Slow rate;Small sips/bites;Monitor for anterior loss;Minimize environmental distractions Postural Changes Seated upright at 90 degrees   CHL IP OTHER RECOMMENDATIONS 01/15/2016 Recommended Consults -- Oral Care Recommendations Oral care QID Other Recommendations --   CHL IP FOLLOW UP RECOMMENDATIONS 01/15/2016 Follow up Recommendations Skilled Nursing facility   [facility] IP FREQUENCY AND DURATION 01/15/2016 Speech Therapy Frequency (ACUTE ONLY) min 2x/week Treatment Duration 2 weeks      CHL IP ORAL PHASE 01/15/2016 Oral Phase Impaired Oral - Pudding Teaspoon -- Oral - Pudding Cup -- Oral - Honey Teaspoon Left anterior bolus loss;Left pocketing in lateral sulci Oral - Honey Cup Left anterior bolus loss;Left pocketing in lateral sulci Oral - Nectar Teaspoon Left anterior bolus loss;Left pocketing in lateral sulci Oral -  Nectar Cup -- Oral - Nectar Straw -- Oral - Thin Teaspoon -- Oral - Thin Cup -- Oral - Thin Straw -- Oral - Puree Left anterior bolus loss Oral - Mech Soft -- Oral - Regular -- Oral - Multi-Consistency -- Oral - Pill -- Oral Phase - Comment --  CHL IP PHARYNGEAL PHASE 01/15/2016 Pharyngeal Phase Impaired Pharyngeal- Pudding Teaspoon -- Pharyngeal -- Pharyngeal- Pudding Cup -- Pharyngeal -- Pharyngeal- Honey Teaspoon Delayed swallow initiation-vallecula;Pharyngeal residue - valleculae;Reduced tongue base retraction Pharyngeal -- Pharyngeal- Honey Cup Delayed swallow  initiation-vallecula;Delayed swallow initiation-pyriform sinuses;Pharyngeal residue - valleculae;Pharyngeal residue - pyriform;Reduced tongue base retraction Pharyngeal -- Pharyngeal- Nectar Teaspoon Delayed swallow initiation-pyriform sinuses;Penetration/Aspiration before swallow;Penetration/Aspiration during swallow;Pharyngeal residue - valleculae;Pharyngeal residue - pyriform;Reduced tongue base retraction Pharyngeal Material enters airway, passes BELOW cords without attempt by patient to eject out (silent aspiration) Pharyngeal- Nectar Cup -- Pharyngeal -- Pharyngeal- Nectar Straw -- Pharyngeal -- Pharyngeal- Thin Teaspoon -- Pharyngeal -- Pharyngeal- Thin Cup -- Pharyngeal -- Pharyngeal- Thin Straw -- Pharyngeal -- Pharyngeal- Puree Delayed swallow initiation-vallecula;Pharyngeal residue - valleculae;Reduced tongue base retraction Pharyngeal -- Pharyngeal- Mechanical Soft -- Pharyngeal -- Pharyngeal- Regular -- Pharyngeal -- Pharyngeal- Multi-consistency -- Pharyngeal -- Pharyngeal- Pill -- Pharyngeal -- Pharyngeal Comment --  No flowsheet data found. No flowsheet data found. Herbie [place], MA CCC-SLP [phone] Lynann Beaver 01/15/2016, 1:52 PM              Ct Maxillofacial Wo Cm  Result Date: 01/14/2016 CLINICAL DATA:  70 year old male with history of left-sided weakness and right-sided gaze. Code stroke. EXAM: CT HEAD WITHOUT CONTRAST CT  MAXILLOFACIAL WITHOUT CONTRAST CT CERVICAL SPINE WITHOUT CONTRAST TECHNIQUE: Multidetector CT imaging of the head, cervical spine, and maxillofacial structures were performed using the standard protocol without intravenous contrast. Multiplanar CT image reconstructions of the cervical spine and maxillofacial structures were also generated. COMPARISON:  No priors. FINDINGS: CT HEAD FINDINGS Loss of gray-white differentiation in the right frontal and parietal lobes, suggesting subacute ischemia in the right MCA distribution. Right MCA is hyperdense. There is well-defined low-attenuation in the left parietal and occipital regions, compatible with encephalomalacia from remote left-sided infarct. Physiologic calcifications are noted in the basal ganglia bilaterally. No signs of acute intracranial hemorrhage. No mass, mass effect, hydrocephalus or abnormal intra or extra-axial fluid collections. Visualized paranasal sinuses and mastoids are well pneumatized. CT MAXILLOFACIAL FINDINGS No acute displaced facial bone fractures. Specifically, pterygoid plates are intact. Mandible is intact, and mandibular condyles are located bilaterally. Bilateral globes and retro-orbital soft tissues are grossly normal in appearance. CT CERVICAL SPINE FINDINGS No acute displaced cervical spine fractures. Alignment is anatomic. Prevertebral soft tissues are normal. Mild emphysematous changes are noted in the visualized portions of the lung apices. There is some patchy ground-glass attenuation and septal thickening in the lung apices. IMPRESSION: 1. Hyperdense right MCA indicative of thrombus, with evidence of subacute ischemia in the right MCA distribution, as above. 2. No evidence of significant acute traumatic injury to the facial bones or cervical spine. Critical Value/emergent results were called by telephone at the time of interpretation on 01/14/2016 at 4:34 pm to Dr. Shon Hale, who verbally acknowledged these results. Electronically Signed    By: Vinnie Langton M.D.   On: 01/14/2016 16:54    PHYSICAL EXAM Frail middle-aged [name] currently not in distress. . Afebrile. Head is nontraumatic. Neck is supple without bruit.    Cardiac exam no murmur or gallop. Lungs are clear to auscultation. Distal pulses are well felt.left leg wounds likely scratch marks at insect bite sites. Neurological Exam :  Awake alert follows commands well. Mild dysarthria but can be understood. Right gaze preference but can look to the left past midline but not all the way. Mild left visual neglect versus hemianopsia. Moderate left lower facial weakness. Tongue midline. Dense left hemiplegia with mild left hemi-neglect and sensory loss. Purposeful antigravity movements on the right. Mild withdrawal of the left lower extremity to pain but no movement in the left upper extremity. Deep tendon reflexes are normal on the right and depressed on  the left. Left plantar upgoing right downgoing.   ASSESSMENT/PLAN Mr. Erjon Kluesner is a 70 y.o. male with history of AFib, non-hodgkins lymphoma, GIB HLD, HTN and tubular adenoma presenting after a fall off a park bench with L sided weakness . He did not receive IV t-PA or OR due to high ASPECTS score.   Stroke:  Non-dominant large Right MCA infarct in setting of hyperdense R MCA, felt to be embolic secondary to known atrial fibrillation not on anticoagulation  Resultant  Left hemiparesis, left hemisensory loss, left hemianopsia, dysarthria  Initial CT head hyperdense MCA sign  CT Cervical spine no significant traumatic injury to the face or cervical spine  MRI  / MRA  pacemaker CTA head and neck 1. 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 Nonhemorrhagic acute infarct throughout the right MCA territory and in the distal right ACA territory.  2. Remote left parietal occipital infarct.  Carotid Doppler  canceled 2D Echo  Left ventricle: The cavity size was mildly dilated. Wall    thickness was normal. Systolic function was severely reduced. The    estimated ejection fraction was 15%. Diffuse hypokinesis.  Check EEG due to staring spells    Repeat follow up CT in am    LDL 64  HgbA1c 6.7  SCDs for VTE prophylaxis DIET - DYS 1 Room service appropriate? Yes; Fluid consistency: Honey Thick  aspirin 81 mg daily prior to admission, now on aspirin 325 mg daily  Patient counseled to be compliant with his antithrombotic medications  Ongoing aggressive stroke risk factor management  Therapy recommendations:  Pending. Given large stroke, keep in bed today. May be out of bed in AM if stable.  Disposition:  pending (recently discharged from jail, no family, reportedly has a girlfriend)  Atrial Fibrillation  Home anticoagulation:  none   Likely, not a good anticoagulation candidate secondary to social situation and compliance  Plan aspirin for 1 week and then change to eliquis if he can afford it  Hypertension  Stable  Permissive hypertension (OK if < 220/120) but gradually normalize in 5-7 days  Long-term BP goal normotensive  Hyperlipidemia  Hx of HLD but not on home meds:  No statin  LDL 65, at goal < 70  Hyperglycemia  No documented hx  Elevated 146, 150  HgbA1c pending   Other Stroke Risk Factors  Advanced age  Current every day Cigarette smoker, advised to stop smoking  ETOH use, advised to drink no more than 2 drink(s) a day  Marijuana use, UDS not done this admission   Other Active Problems  History non-Hodgkin's lymphoma  History of GI bleeding  History tubular adenoma  History of complete heart block  Lower extremities leg wounds - patient states from insect bite - local pruritis, developing wounds. WOC consulted to evaluate, recommend treatment  Check labs in the morning  Hospital day # [place] for Pager information 01/16/2016 9:29 AM   I have personally examined this  patient, reviewed notes, independently viewed imaging studies, participated in medical decision making and plan of care. I have made any additions or clarifications directly to the above note. Agree with note above.  Continue hypertonic saline with serum sodium goal 150-155. Close neurological monitoring. Continue aspirin for now may change to anticoagulation after 1 week. No family available. Check EEG for seizures given few staring spells today This patient is critically ill and at significant risk of neurological worsening, death  and care requires constant monitoring of vital signs, hemodynamics,respiratory and cardiac monitoring, extensive review of multiple databases, frequent neurological assessment, discussion with family, other specialists and medical decision making of high complexity.I have made any additions or clarifications directly to the above note.This critical care time does not reflect procedure time, or teaching time or supervisory time of PA/NP/Med Resident etc but could involve care discussion time.  I spent 40 minutes of neurocritical care time  in the care of  this patient.      Antony Contras, MD Medical Director [place] Pager: [phone] 01/16/2016 12:51 PM   To contact Stroke Continuity provider, please refer to [email or web address] After hours, contact General Neurology

## 2016-01-16 NOTE — Progress Notes (Signed)
Pt noted to be very rhonchi at hourly assessment. Pt with weak cough, strong gag with suctioning. Thick white tinged secretions suctioned out, but remained to sound wet. RT called to assess. NT suctioning order received by Dr. Nicole Kindred. Pt maintain stats >90 on 2L nasal cannula throughout suctioning. No distress noted at this time. Will continue to monitor. Lianne Bushy RN BSN.

## 2016-01-17 ENCOUNTER — Inpatient Hospital Stay (HOSPITAL_COMMUNITY): Payer: Medicare Other

## 2016-01-17 DIAGNOSIS — L8990 Pressure ulcer of unspecified site, unspecified stage: Secondary | ICD-10-CM | POA: Insufficient documentation

## 2016-01-17 DIAGNOSIS — G935 Compression of brain: Secondary | ICD-10-CM

## 2016-01-17 DIAGNOSIS — I4891 Unspecified atrial fibrillation: Secondary | ICD-10-CM

## 2016-01-17 DIAGNOSIS — G936 Cerebral edema: Secondary | ICD-10-CM

## 2016-01-17 LAB — SODIUM
SODIUM: 153 mmol/L — AB (ref 135–145)
SODIUM: 151 mmol/L — AB (ref 135–145)
Sodium: 152 mmol/L — ABNORMAL HIGH (ref 135–145)
Sodium: 155 mmol/L — ABNORMAL HIGH (ref 135–145)
Sodium: 156 mmol/L — ABNORMAL HIGH (ref 135–145)

## 2016-01-17 MED ORDER — CETYLPYRIDINIUM CHLORIDE 0.05 % MT LIQD
7.0000 mL | Freq: Two times a day (BID) | OROMUCOSAL | Status: DC
  Administered 2016-01-17 – 2016-01-21 (×6): 7 mL via OROMUCOSAL

## 2016-01-17 MED ORDER — PNEUMOCOCCAL VAC POLYVALENT 25 MCG/0.5ML IJ INJ
0.5000 mL | INJECTION | INTRAMUSCULAR | Status: AC
  Administered 2016-01-18: 0.5 mL via INTRAMUSCULAR
  Filled 2016-01-17: qty 0.5

## 2016-01-17 MED ORDER — ENOXAPARIN SODIUM 40 MG/0.4ML ~~LOC~~ SOLN
40.0000 mg | SUBCUTANEOUS | Status: DC
  Administered 2016-01-17 – 2016-01-26 (×10): 40 mg via SUBCUTANEOUS
  Filled 2016-01-17 (×10): qty 0.4

## 2016-01-17 MED ORDER — ACETAMINOPHEN 325 MG PO TABS
650.0000 mg | ORAL_TABLET | Freq: Four times a day (QID) | ORAL | Status: DC | PRN
  Administered 2016-01-20: 650 mg via ORAL
  Filled 2016-01-17: qty 2

## 2016-01-17 NOTE — Evaluation (Signed)
Occupational Therapy Evaluation Patient Details Name: [name] MRN: [id number] DOB: 05-Jan-1946 Today's Date: 01/17/2016    History of Present Illness 53-yo man who was in jail.Pt found to have  EMS reported that he got into a "scuffle" with officers at the jail. They reportedly carried him out to a bench. He fell off of the bench and EMS was activated. They arrived to find the patient with left-sided weakness and abrasions over his face. He was initially thought to be a trauma but on arrival to ED the attending called [place]. CT shows Non-dominant large Right MCA infarct in setting of hyperdense R MCA.   Clinical Impression   Pt intermittently oriented/following commands and at times pt is very lethargic. Pt unable to provide PLOF information at this time due to impaired cognition. Pt able to complete grooming and self-feeding activities at bed level with use of RUE. Pt presenting with LUE weakness, decreased AROM, L inattention, L visual field deficit, and impaired cognition impacting his independence and safety with ADL and functional mobility. Recommending CIR level therapies for follow up in order to maximize independence and safety with ADL and functional mobility prior to return home. Pt would benefit from continued skilled OT to address established goals.    Follow Up Recommendations  CIR;Supervision/Assistance - 24 hour    Equipment Recommendations  Other (comment) (TBD at next venue)    Recommendations for Other Services Rehab consult;PT consult     Precautions / Restrictions Precautions Precautions: Fall Precaution Comments: L hemi, L inattention/neglect Restrictions Weight Bearing Restrictions: No      Mobility Bed Mobility               General bed mobility comments: Pt unable to perform at this time secondary to lethargy and decreased following commands.  Transfers                 General transfer comment: Did not assess    Balance                                             ADL Overall ADL's : Needs assistance/impaired Eating/Feeding: Minimal assistance;Bed level Eating/Feeding Details (indicate cue type and reason): Min assist to scoop thickened water from cup; pt able to bring spoon to mouth to self feed with R hand. Grooming: Set up;Supervision/safety;Wash/dry face;Bed level   Upper Body Bathing: Maximal assistance;Bed level   Lower Body Bathing: Maximal assistance;Bed level                         General ADL Comments: Transfers not assessed. Pt intermittently lethargic throughout session; more at end of session therefore mobility was not attempted. Pt intermittently following commands but participates better with functional activities.     Vision Vision Assessment?: Vision impaired- to be further tested in functional context Additional Comments: Difficult to assess due to impaired cognition. Pt with noticible R head turn and preference toward R side. Pt unable to track at this time to attend to stimulus on L side.   Perception     Praxis      Pertinent Vitals/Pain Pain Assessment: Faces Faces Pain Scale: Hurts little more Pain Location: abdomen Pain Descriptors / Indicators: Aching Pain Intervention(s): Monitored during session     Hand Dominance Right   Extremity/Trunk Assessment Upper Extremity Assessment Upper Extremity Assessment: LUE deficits/detail LUE  Deficits / Details: No AROM noted in LUE, full PROM. Pt does not withdrawl to painful stimulus. LUE Sensation: decreased light touch LUE Coordination: decreased fine motor;decreased gross motor   Lower Extremity Assessment Lower Extremity Assessment: Defer to PT evaluation       Communication Communication Communication: Expressive difficulties (slurred speech)   Cognition Arousal/Alertness: Lethargic (intermittently, able to arouse) Behavior During Therapy: Flat affect Overall Cognitive Status: No family/caregiver  present to determine baseline cognitive functioning Area of Impairment: Following commands;Safety/judgement;Problem solving       Following Commands: Follows one step commands inconsistently Safety/Judgement: Decreased awareness of safety;Decreased awareness of deficits   Problem Solving: Decreased initiation;Difficulty sequencing;Requires verbal cues;Requires tactile cues General Comments: Pt perseverating on wanting water.   General Comments       Exercises       Shoulder Instructions      Home Living Family/patient expects to be discharged to:: Unsure                                 Additional Comments: Pt unable to provide home living or PLOF information at this time due to impaired cognition and lethargy.      Prior Functioning/Environment          Comments: Pt unable to provide information.    OT Diagnosis: Generalized weakness;Cognitive deficits;Disturbance of vision;Acute pain;Hemiplegia non-dominant side;Altered mental status   OT Problem List: Decreased strength;Decreased range of motion;Decreased activity tolerance;Impaired balance (sitting and/or standing);Impaired vision/perception;Decreased coordination;Decreased cognition;Decreased safety awareness;Decreased knowledge of use of DME or AE;Decreased knowledge of precautions;Impaired sensation;Impaired tone;Impaired UE functional use;Pain;Increased edema   OT Treatment/Interventions: Self-care/ADL training;Therapeutic exercise;Neuromuscular education;Energy conservation;DME and/or AE instruction;Therapeutic activities;Cognitive remediation/compensation;Visual/perceptual remediation/compensation;Patient/family education;Balance training    OT Goals(Current goals can be found in the care plan section) Acute Rehab OT Goals Patient Stated Goal: "I want water" OT Goal Formulation: With patient Time For Goal Achievement: 01/31/16 Potential to Achieve Goals: Good ADL Goals Pt Will Perform Grooming:  with supervision;sitting Pt Will Perform Upper Body Bathing: with min assist;sitting Pt Will Perform Lower Body Bathing: with mod assist;sit to/from stand Pt Will Transfer to Toilet: with mod assist;with +2 assist;bedside commode;ambulating Additional ADL Goal #1: Pt/caregiver will independently demonstrate proper positioning of LUE.  OT Frequency: Min 2X/week   Barriers to D/C:            Co-evaluation              End of Session Nurse Communication: Other (comment) (pt intermittently lethargic)  Activity Tolerance: Patient limited by lethargy Patient left: in bed;with call bell/phone within reach   Time: [id number] OT Time Calculation (min): 18 min Charges:  OT General Charges $OT Visit: 1 Procedure OT Evaluation $OT Eval Moderate Complexity: 1 Procedure G-Codes:     Binnie Kand M.S., OTR/L [phone]  01/17/2016, 5:04 PM

## 2016-01-17 NOTE — Evaluation (Signed)
Physical Therapy Evaluation Patient Details Name: [name] MRN: [id number] DOB: 1946-01-15 Today's Date: 01/17/2016   History of Present Illness  70 y.o. male admitted to [facility] on 01/14/16 with left sided weakness.  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  Pt with significant PMHx of HTN, GIB, complete heart block, a-fib, and pacemaker.  Clinical Impression  Pt is weak and difficult to arouse enough to participate in my assessment.  He tolerated sitting EOB (VSS), but had waxing and waning alertness.  When he was alert, he perseverated on getting water.  He is significantly impaired and will likely require SNF placement at discharge.   PT to follow acutely for deficits listed below.       Follow Up Recommendations SNF;Supervision/Assistance - 24 hour    Equipment Recommendations  Wheelchair (measurements PT);Wheelchair cushion (measurements PT);Hospital bed;Other (comment) (hoyer lift)    Recommendations for Other Services   NA    Precautions / Restrictions Precautions Precautions: Fall Precaution Comments: L hemi, L inattention/neglect Restrictions Weight Bearing Restrictions: No      Mobility  Bed Mobility Overal bed mobility: Needs Assistance Bed Mobility: Supine to Sit;Sit to Supine     Supine to sit: HOB elevated;Total assist Sit to supine: Total assist   General bed mobility comments: HOB maximally elevated and PT used bed pad to progress hips to EOB.  Pt needed assist to get bil legs and trunk up to sitting EOB.  No initiation of movement to help with transitions at all.  Pt needed total assist to support legs and trunk to get safely and slowly back to supine.        Modified Rankin (Stroke Patients Only) Modified Rankin (Stroke Patients Only) Pre-Morbid Rankin Score: No symptoms Modified Rankin: Severe disability     Balance Overall balance assessment: Needs assistance Sitting-balance support: Feet supported;Single extremity  supported Sitting balance-Leahy Scale: Zero Sitting balance - Comments: Max assist EOB to support trunk for balance.  At times, when more alert, pt would start pushing with his stronger right arm towards his left side.   Postural control: Left lateral lean;Posterior lean                                   Pertinent Vitals/Pain Pain Assessment: Faces Pain Score: 0-No pain Faces Pain Scale: Hurts little more Pain Location: abdomen Pain Descriptors / Indicators: Aching Pain Intervention(s): Monitored during session    Home Living Family/patient expects to be discharged to:: Unsure                 Additional Comments: Pt unable to report history    Prior Function           Comments: unable to report     Hand Dominance   Dominant Hand: Right    Extremity/Trunk Assessment   Upper Extremity Assessment: Defer to OT evaluation       LUE Deficits / Details: No AROM noted in LUE, full PROM. Pt does not withdrawl to painful stimulus.   Lower Extremity Assessment: LLE deficits/detail   LLE Deficits / Details: left leg with the most obvious deficits.  Unable to elicit any active motion out of this foot or leg.  Right side moves actively, just not to command.  Pt is very emaciated and has poor muscle tone (he seems very underweight).   Cervical / Trunk Assessment: Kyphotic  Communication  Communication: Other (comment) (slurred, mumbling speech)  Cognition Arousal/Alertness: Lethargic Behavior During Therapy: Flat affect Overall Cognitive Status: No family/caregiver present to determine baseline cognitive functioning Area of Impairment: Orientation;Attention;Memory;Following commands;Safety/judgement;Awareness;Problem solving Orientation Level: Disoriented to;Person;Place;Time;Situation Current Attention Level: Focused Memory: Decreased recall of precautions;Decreased short-term memory Following Commands: Follows one step commands  inconsistently Safety/Judgement: Decreased awareness of safety;Decreased awareness of deficits Awareness: Intellectual Problem Solving: Decreased initiation;Difficulty sequencing;Requires verbal cues;Requires tactile cues General Comments: Pt kept clearly stating that he wanted water, unable to tell me where he is and comes in and out of lethargic state.              Assessment/Plan    PT Assessment Patient needs continued PT services  PT Diagnosis Difficulty walking;Abnormality of gait;Generalized weakness;Acute pain;Hemiplegia non-dominant side;Altered mental status   PT Problem List Decreased strength;Decreased range of motion;Decreased activity tolerance;Decreased balance;Decreased coordination;Decreased mobility;Decreased safety awareness;Decreased knowledge of use of DME;Decreased cognition;Decreased knowledge of precautions;Impaired sensation;Impaired tone  PT Treatment Interventions DME instruction;Gait training;Stair training;Functional mobility training;Therapeutic activities;Therapeutic exercise;Neuromuscular re-education;Balance training;Cognitive remediation;Patient/family education;Wheelchair mobility training   PT Goals (Current goals can be found in the Care Plan section) Acute Rehab PT Goals Patient Stated Goal: to drink water PT Goal Formulation: Patient unable to participate in goal setting Time For Goal Achievement: 01/31/16 Potential to Achieve Goals: Fair    Frequency Min 4X/week   Barriers to discharge Inaccessible home environment;Decreased caregiver support         End of Session   Activity Tolerance: Patient limited by lethargy Patient left: in bed;with call bell/phone within reach;with bed alarm set Nurse Communication: Mobility status         Time: [id number] PT Time Calculation (min) (ACUTE ONLY): 20 min   Charges:   PT Evaluation $PT Eval Moderate Complexity: 1 Procedure      [name] B. [place], [place], DPT [phone]   01/17/2016, 7:45  PM

## 2016-01-17 NOTE — Progress Notes (Signed)
Patient will intermittently be oriented x 4 but then will also say that he has two fiances and will soon be getting married to one of them in [place].
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
## 2016-01-17 NOTE — Progress Notes (Addendum)
STROKE TEAM PROGRESS NOTE   SUBJECTIVE (INTERVAL HISTORY) Patient received dilaudid during the night. He appears slightly sleepier this morning but can be aroused and follows commands well. Serum sodium is 152 this morning but drip has been off since last night. Follow-up CT scan of the head this morning shows slight increased cerebral edema and  midline shift  OBJECTIVE Temp:  [97.5 F (36.4 C)-98.6 F (37 C)] 97.5 F (36.4 C) (08/09 0800) Pulse Rate:  [35-118] 98 (08/09 0800) Cardiac Rhythm: Atrial fibrillation (08/09 0200) Resp:  [0-28] 12 (08/09 0700) BP: (133-180)/(56-108) 176/76 (08/09 0800) SpO2:  [84 %-100 %] 100 % (08/09 0800)  CBC:   Recent Labs Lab 01/14/16 1602 01/14/16 1612 01/16/16 0440  WBC 9.7  --  10.0  HGB 11.5* 12.9* 11.2*  HCT 38.0* 38.0* 37.1*  MCV 96.7  --  96.9  PLT 209  --  123456    Basic Metabolic Panel:   Recent Labs Lab 01/14/16 1602 01/14/16 1612  01/16/16 0440  01/16/16 2320 01/17/16 0454  NA 138 140  < > 147*  < > 153* 152*  K 4.5 4.5  --  5.1  --   --   --   CL 109 108  --  118*  --   --   --   CO2 21*  --   --  22  --   --   --   GLUCOSE 150* 146*  --  144*  --   --   --   BUN 18 24*  --  20  --   --   --   CREATININE 0.77 0.60*  --  0.61  --   --   --   CALCIUM 9.0  --   --  8.7*  --   --   --   < > = values in this interval not displayed.  Lipid Panel:     Component Value Date/Time   CHOL 104 01/15/2016 0405   TRIG 46 01/15/2016 0405   HDL 31 (L) 01/15/2016 0405   CHOLHDL 3.4 01/15/2016 0405   VLDL 9 01/15/2016 0405   LDLCALC 64 01/15/2016 0405   HgbA1c:  Lab Results  Component Value Date   HGBA1C 6.7 (H) 01/15/2016    IMAGING  Ct Angio Head W Or Wo Contrast  Result Date: 01/15/2016 CLINICAL DATA:  Stroke due to embolism of right middle cerebral artery. Fall yesterday. EXAM: CT ANGIOGRAPHY HEAD AND NECK TECHNIQUE: Multidetector CT imaging of the head and neck was performed using the standard protocol during bolus  administration of intravenous contrast. Multiplanar CT image reconstructions and MIPs were obtained to evaluate the vascular anatomy. Carotid stenosis measurements (when applicable) are obtained utilizing NASCET criteria, using the distal internal carotid diameter as the denominator. CONTRAST:  50 cc Isovue 370 intravenous COMPARISON:  Head CT 01/14/2016 FINDINGS: CTA NECK Aortic arch: Atheromatous changes. No dissection. Three vessel branching Right carotid system: Noncalcified plaque on the brachiocephalic artery without flow limiting stenosis. Asymmetric poor opacification throughout the right carotid circulation due to distal right ICA occlusion. The lower margin of the clot is not confidently identifiable due to graded fading of the contrast bolus. There is noncalcified plaque at the bifurcation with ICA narrowing difficult quantify due to poor opacification and probable underfilling of the downstream vessel. Subjectively stenosis does not appear greater than 50%. Left carotid system: Atheromatous changes without flow limiting stenosis. No dissection or occlusion. Vertebral arteries:No proximal subclavian artery stenosis. Dominant on the right. Mild  bilateral ostial stenosis from calcified plaque. No evidence of dissection. Skeleton: No acute or aggressive process Other neck: No acute finding Upper chest: Recent chest CT. There is airway thickening which was also seen on the previous scan, with debris in central airway use and dependent atelectasis. CTA HEAD Anterior circulation: No flow within the right ICA, large majority of right MCA branches, and right A1 segment. There is an anterior communicating artery with filling of the diminutive right A2 segment. Delayed intravascular enhancement in right MCA branches is noted. No left-sided branch occlusion. Posterior circulation: Dominant right vertebral artery. Dominant bilateral AICA. No major branch occlusion or flow limiting stenosis. Venous sinuses: Patent as  permitted by contrast timing Anatomic variants: None significant Delayed phase: Cytotoxic edema throughout the right MCA territory and in the distal right ACA territory (high parasagittal right frontal lobe). There is effacement of the right lateral ventricle without notable midline shift. Remote left occipital parietal infarct. No hemorrhagic conversion noted. IMPRESSION: 1. 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 Nonhemorrhagic acute infarct throughout the right MCA territory and in the distal right ACA territory. 2. Remote left parietal occipital infarct. 3. Extensive debris in the central airways. Correlate for symptoms of aspiration. Electronically Signed   By: Monte Fantasia M.D.   On: 01/15/2016 16:20   Ct Head Wo Contrast  Result Date: 01/17/2016 CLINICAL DATA:  Follow-up exam for acute stroke. EXAM: CT HEAD WITHOUT CONTRAST TECHNIQUE: Contiguous axial images were obtained from the base of the skull through the vertex without intravenous contrast. COMPARISON:  Prior CT from 01/15/2016. FINDINGS: Extensive cytotoxic edema involving a large portion of the right MCA territory is evident. This involves primarily the anterior and middle distributions, with relative sparing of the posterior division. There is involvement of the right basal ganglia. Additional all cytotoxic edema present within the right ACA territory as well. No evidence for hemorrhagic transformation. Hyperdensity within the right M1 segment likely reflects thrombus. Localized edema and mass effect on the adjacent right ventricle which is attenuated and compressed, similar to prior CT and. There is associated up to 1 cm of midline shift, increased from prior. Mild asymmetric dilatation of the left lateral ventricle is slightly worsened relative to previous study, concerning for possible developing ventricular trapping. Remote left parietal infarct again noted. No other acute or evolving infarct. No acute  intracranial hemorrhage. No mass lesion or extra-axial fluid collection. Scalp soft tissues demonstrate no acute abnormality. No acute abnormality about the globes and orbits. Paranasal sinuses are clear.  No mastoid effusion. Calvarium intact. IMPRESSION: 1. Large evolving subacute right MCA territory infarct, with associated smaller right ACA territory infarct. No evidence for hemorrhagic transformation. 2. Worsened localized edema with increased 11 mm of right-to-left shift. No hydrocephalus. Slightly worsened asymmetric dilatation of the left lateral ventricle concerning for possible developing ventricular trapping. Electronically Signed   By: Jeannine Boga M.D.   On: 01/17/2016 06:35   Ct Angio Neck W Or Wo Contrast  Result Date: 01/15/2016 CLINICAL DATA:  Stroke due to embolism of right middle cerebral artery. Fall yesterday. EXAM: CT ANGIOGRAPHY HEAD AND NECK TECHNIQUE: Multidetector CT imaging of the head and neck was performed using the standard protocol during bolus administration of intravenous contrast. Multiplanar CT image reconstructions and MIPs were obtained to evaluate the vascular anatomy. Carotid stenosis measurements (when applicable) are obtained utilizing NASCET criteria, using the distal internal carotid diameter as the denominator. CONTRAST:  50 cc Isovue 370 intravenous COMPARISON:  Head CT  01/14/2016 FINDINGS: CTA NECK Aortic arch: Atheromatous changes. No dissection. Three vessel branching Right carotid system: Noncalcified plaque on the brachiocephalic artery without flow limiting stenosis. Asymmetric poor opacification throughout the right carotid circulation due to distal right ICA occlusion. The lower margin of the clot is not confidently identifiable due to graded fading of the contrast bolus. There is noncalcified plaque at the bifurcation with ICA narrowing difficult quantify due to poor opacification and probable underfilling of the downstream vessel. Subjectively  stenosis does not appear greater than 50%. Left carotid system: Atheromatous changes without flow limiting stenosis. No dissection or occlusion. Vertebral arteries:No proximal subclavian artery stenosis. Dominant on the right. Mild bilateral ostial stenosis from calcified plaque. No evidence of dissection. Skeleton: No acute or aggressive process Other neck: No acute finding Upper chest: Recent chest CT. There is airway thickening which was also seen on the previous scan, with debris in central airway use and dependent atelectasis. CTA HEAD Anterior circulation: No flow within the right ICA, large majority of right MCA branches, and right A1 segment. There is an anterior communicating artery with filling of the diminutive right A2 segment. Delayed intravascular enhancement in right MCA branches is noted. No left-sided branch occlusion. Posterior circulation: Dominant right vertebral artery. Dominant bilateral AICA. No major branch occlusion or flow limiting stenosis. Venous sinuses: Patent as permitted by contrast timing Anatomic variants: None significant Delayed phase: Cytotoxic edema throughout the right MCA territory and in the distal right ACA territory (high parasagittal right frontal lobe). There is effacement of the right lateral ventricle without notable midline shift. Remote left occipital parietal infarct. No hemorrhagic conversion noted. IMPRESSION: 1. 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 Nonhemorrhagic acute infarct throughout the right MCA territory and in the distal right ACA territory. 2. Remote left parietal occipital infarct. 3. Extensive debris in the central airways. Correlate for symptoms of aspiration. Electronically Signed   By: Monte Fantasia M.D.   On: 01/15/2016 16:20   Dg Chest Port 1 View  Result Date: 01/15/2016 CLINICAL DATA:  Central line placement EXAM: PORTABLE CHEST 1 VIEW COMPARISON:  None. FINDINGS: Cardiomegaly. Dual lead cardiac pacemaker in  place. There is left IJ central line with tip in SVC right atrium junction. No pneumothorax. No infiltrate or pulmonary edema. IMPRESSION: Left IJ central line in place.  No pneumothorax. Electronically Signed   By: Lahoma Crocker M.D.   On: 01/15/2016 19:46   Dg Swallowing Func-speech Pathology  Result Date: 01/15/2016 Objective Swallowing Evaluation: Type of Study: MBS-Modified Barium Swallow Study Patient Details Name: [name] MRN: [id number] Date of Birth: 01-23-1946 Today's Date: 01/15/2016 Time: SLP Start Time (ACUTE ONLY): 1245-SLP Stop Time (ACUTE ONLY): 1320 SLP Time Calculation (min) (ACUTE ONLY): 35 min Past Medical History: Past Medical History: Diagnosis Date . A-fib ([place])  . Cancer ([place])   non hodgkins . Complete heart block ([place])  . GIB (gastrointestinal bleeding)  . Hyperlipidemia  . Hypertension  . Tubular adenoma  Past Surgical History: Past Surgical History: Procedure Laterality Date . APPENDECTOMY   . CHOLECYSTECTOMY   . ESOPHAGOGASTRODUODENOSCOPY   . PACEMAKER PLACEMENT   . PARTIAL COLECTOMY   HPI: This is a 56-yo man who was in jail.Pt found to have  EMS reported that he got into a "scuffle" with officers at the jail. They reportedly carried him out to a bench. He fell off of the bench and EMS was activated. They arrived to find the patient with left-sided weakness and abrasions over his  face. He was initially thought to be a trauma but on arrival to ED the attending called [place]. CT shows infarct in right frontal and parietal No Data Recorded Assessment / Plan / Recommendation CHL IP CLINICAL IMPRESSIONS 01/15/2016 Therapy Diagnosis Moderate pharyngeal phase dysphagia;Severe oral phase dysphagia;Severe pharyngeal phase dysphagia;Moderate oral phase dysphagia Clinical Impression Pt presents with moderate to severe oral and oropharyngeal deficits with high risk of aspiration without modified diet and careful feeding. Oral phase is impaired by CN VII sensorimotor deficits leading to anterior  spillafge (severe with self feeding) and left buccal residuals. Swallow initiation is delayed with nectar teaspoon reaching the pyriforms, leading to silent aspiration as swallow is initiated. After aspirate descends trachea pt has weak ineffective cough. Honey and puree textures are tolerated well, even with impulsive over-large cup sips. Given oral deficits and impulsivity the pt needs dys 1 (puree textures) and honey thick liquids via teaspoon or controlled cup sip with full supervision. Will follow for potential for upgrade and congnitive linguistic eval.  Impact on safety and function Severe aspiration risk   CHL IP TREATMENT RECOMMENDATION 01/15/2016 Treatment Recommendations Therapy as outlined in treatment plan below   Prognosis 01/15/2016 Prognosis for Safe Diet Advancement Good Barriers to Reach Goals -- Barriers/Prognosis Comment -- CHL IP DIET RECOMMENDATION 01/15/2016 SLP Diet Recommendations Honey thick liquids;Dysphagia 1 (Puree) solids Liquid Administration via Spoon;Cup Medication Administration Crushed with puree Compensations Slow rate;Small sips/bites;Monitor for anterior loss;Minimize environmental distractions Postural Changes Seated upright at 90 degrees   CHL IP OTHER RECOMMENDATIONS 01/15/2016 Recommended Consults -- Oral Care Recommendations Oral care QID Other Recommendations --   CHL IP FOLLOW UP RECOMMENDATIONS 01/15/2016 Follow up Recommendations Skilled Nursing facility   [facility] IP FREQUENCY AND DURATION 01/15/2016 Speech Therapy Frequency (ACUTE ONLY) min 2x/week Treatment Duration 2 weeks      CHL IP ORAL PHASE 01/15/2016 Oral Phase Impaired Oral - Pudding Teaspoon -- Oral - Pudding Cup -- Oral - Honey Teaspoon Left anterior bolus loss;Left pocketing in lateral sulci Oral - Honey Cup Left anterior bolus loss;Left pocketing in lateral sulci Oral - Nectar Teaspoon Left anterior bolus loss;Left pocketing in lateral sulci Oral - Nectar Cup -- Oral - Nectar Straw -- Oral - Thin Teaspoon -- Oral - Thin Cup  -- Oral - Thin Straw -- Oral - Puree Left anterior bolus loss Oral - Mech Soft -- Oral - Regular -- Oral - Multi-Consistency -- Oral - Pill -- Oral Phase - Comment --  CHL IP PHARYNGEAL PHASE 01/15/2016 Pharyngeal Phase Impaired Pharyngeal- Pudding Teaspoon -- Pharyngeal -- Pharyngeal- Pudding Cup -- Pharyngeal -- Pharyngeal- Honey Teaspoon Delayed swallow initiation-vallecula;Pharyngeal residue - valleculae;Reduced tongue base retraction Pharyngeal -- Pharyngeal- Honey Cup Delayed swallow initiation-vallecula;Delayed swallow initiation-pyriform sinuses;Pharyngeal residue - valleculae;Pharyngeal residue - pyriform;Reduced tongue base retraction Pharyngeal -- Pharyngeal- Nectar Teaspoon Delayed swallow initiation-pyriform sinuses;Penetration/Aspiration before swallow;Penetration/Aspiration during swallow;Pharyngeal residue - valleculae;Pharyngeal residue - pyriform;Reduced tongue base retraction Pharyngeal Material enters airway, passes BELOW cords without attempt by patient to eject out (silent aspiration) Pharyngeal- Nectar Cup -- Pharyngeal -- Pharyngeal- Nectar Straw -- Pharyngeal -- Pharyngeal- Thin Teaspoon -- Pharyngeal -- Pharyngeal- Thin Cup -- Pharyngeal -- Pharyngeal- Thin Straw -- Pharyngeal -- Pharyngeal- Puree Delayed swallow initiation-vallecula;Pharyngeal residue - valleculae;Reduced tongue base retraction Pharyngeal -- Pharyngeal- Mechanical Soft -- Pharyngeal -- Pharyngeal- Regular -- Pharyngeal -- Pharyngeal- Multi-consistency -- Pharyngeal -- Pharyngeal- Pill -- Pharyngeal -- Pharyngeal Comment --  No flowsheet data found. No flowsheet data found. Herbie [place], [place] CCC-SLP [phone] Lynann Beaver 01/15/2016, 1:52  PM              EEG This awake and drowsy EEG is abnormal due to the presence of: 1. Mild diffuse slowing of the waking background. 2. Focal slowing over the left hemisphere   PHYSICAL EXAM Frail middle-aged [name] currently not in distress. . Afebrile. Head is  nontraumatic. Neck is supple without bruit.    Cardiac exam no murmur or gallop. Lungs are clear to auscultation. Distal pulses are well felt.left leg wounds likely scratch marks at insect bite sites. Neurological Exam :  Awake alert follows commands well. Disoriented x 2.Mild dysarthria but can be understood. Right gaze preference but can look to the left past midline but not all the way. Mild left visual neglect versus hemianopsia. Moderate left lower facial weakness. Tongue midline. Dense left hemiplegia with mild left hemi-neglect and sensory loss. Purposeful antigravity movements on the right. Mild withdrawal of the left lower extremity to pain but no movement in the left upper extremity. Deep tendon reflexes are normal on the right and depressed on the left. Left plantar upgoing right downgoing.   ASSESSMENT/PLAN [name] is a 70 y.o. male with history of AFib, non-hodgkins lymphoma, GIB HLD, HTN and tubular adenoma presenting after a fall off a park bench with L sided weakness . He did not receive IV t-PA or OR due to high ASPECTS score.   Stroke:  Non-dominant large Right MCA infarct in setting of hyperdense R MCA, felt to be embolic secondary to known atrial fibrillation not on anticoagulation  Resultant  Left hemiparesis, left hemisensory loss, left hemianopsia, dysarthria  Initial CT head hyperdense MCA sign  CT Cervical spine no significant traumatic injury to the face or cervical spine  MRI  / MRA  Pacemaker  CTA head and neck Distal right ICA thrombus extending into right M1 and A1 segments. right MCA and right ACA territory infarct. Old left parietal occipital infarct.  Carotid Doppler  Canceled  2D Echo  EF 15%. No source of embolus. Diffuse hypokinesis.  EEG  No definite seizure activity follow up CT  01/17/16 shows Worsened localized edema with increased 11 mm of right-to-left shift. No hydrocephalus. Slightly worsened asymmetric dilatation of the left lateral  ventricle concerning for possible developing  ventricular trapping.  LDL 64  HgbA1c 6.7  SCDs for VTE prophylaxis DIET - DYS 1 Room service appropriate? Yes; Fluid consistency: Honey Thick  aspirin 81 mg daily prior to admission, now on aspirin 325 mg daily  Patient counseled to be compliant with his antithrombotic medications  Ongoing aggressive stroke risk factor management  Therapy recommendations:  Pending. Given large stroke, keep in bed today. May be out of bed in AM if stable.  Disposition:  pending (homeless. recently discharged from jail, no family, reportedly has a girlfriend)  Atrial Fibrillation  Home anticoagulation:  none   Likely, not a good anticoagulation candidate secondary to social situation and compliance  Plan aspirin for 1 week and then change to eliquis if he can afford it  Hypertension  Stable  Permissive hypertension (OK if < 220/120) but gradually normalize in 5-7 days  Long-term BP goal normotensive  Hyperlipidemia  Hx of HLD but not on home meds:  No statin  LDL 65, at goal < 70  Hyperglycemia  No documented hx  Elevated 146, 150  HgbA1c pending   Other Stroke Risk Factors  Advanced age  Current every day Cigarette smoker, advised to stop smoking  ETOH use, advised to  drink no more than 2 drink(s) a day  Marijuana use, UDS not done this admission   Other Active Problems  History non-Hodgkin's lymphoma  History of GI bleeding  History tubular adenoma  History of complete heart block  Lower extremities leg wounds - patient states from insect bite - local pruritis, developing wounds. WOC consulted to evaluate, recommend treatment -> Cleanse left hip wound with normal saline, pat dry. Cover wound bed with Xeroform gauze and secure with foam dressing. Change every other day. (2) Apply protective foam dressing to left malleolus and left knee. Change prn  Chronic systolic heart Failure - EF 15 %     Hospital day #  3  BIBY,SHARON  [place] for Pager information 01/17/2016 8:52 AM   I have personally examined this patient, reviewed notes, independently viewed imaging studies, participated in medical decision making and plan of care. I have made any additions or clarifications directly to the above note. Agree with note above.  Resume hypertonic saline at 60 cc/hr  with serum sodium goal 150-155. Close neurological monitoring. Continue aspirin for now may change to anticoagulation after 1 week. No family available.   This patient is critically ill and at significant risk of neurological worsening, death and care requires constant monitoring of vital signs, hemodynamics,respiratory and cardiac monitoring, extensive review of multiple databases, frequent neurological assessment, discussion with family, other specialists and medical decision making of high complexity.I have made any additions or clarifications directly to the above note.This critical care time does not reflect procedure time, or teaching time or supervisory time of PA/NP/Med Resident etc but could involve care discussion time.  I spent 30 minutes of neurocritical care time  in the care of  this patient.      Antony Contras, MD Medical Director [facility] Pager: [phone] 01/17/2016 8:52 AM   To contact Stroke Continuity provider, please refer to [email or web address] After hours, contact General Neurology

## 2016-01-17 NOTE — Care Management Important Message (Signed)
Important Message  Patient Details  Name: [name] MRN: [id number] Date of Birth: 31-Aug-1945   Medicare Important Message Given:  Yes    Loann Quill 01/17/2016, 2:54 PM

## 2016-01-17 NOTE — Progress Notes (Signed)
Sheriff delivered guardianship papers at the bedside. Papers placed in shadow chart.

## 2016-01-17 NOTE — Progress Notes (Signed)
Rehab Admissions Coordinator Note:  Patient was screened by Retta Diones for appropriateness for an Inpatient Acute Rehab Consult.  Noted OT recommending CIR.  At this time, we are recommending Inpatient Rehab consult.  Retta Diones 01/17/2016, 6:47 PM  I can be reached at [phone].

## 2016-01-17 NOTE — Progress Notes (Signed)
Dr. Tasia Catchings aware of Sodium level at 155. Order received to lower rate of 3% saline to 30 cc/hr and recheck a sodium level in 3 hours.

## 2016-01-18 LAB — SODIUM
SODIUM: 158 mmol/L — AB (ref 135–145)
Sodium: 159 mmol/L — ABNORMAL HIGH (ref 135–145)

## 2016-01-18 MED ORDER — NALOXONE HCL 0.4 MG/ML IJ SOLN: 0.2000 mg | Freq: Once | INTRAMUSCULAR | Status: DC

## 2016-01-18 MED ORDER — NALOXONE HCL 0.4 MG/ML IJ SOLN
INTRAMUSCULAR | Status: AC
  Administered 2016-01-18: 0.2 mg
  Filled 2016-01-18: qty 1

## 2016-01-18 MED ORDER — DIVALPROEX SODIUM ER 500 MG PO TB24
500.0000 mg | ORAL_TABLET | Freq: Every day | ORAL | Status: DC
  Administered 2016-01-18 – 2016-01-25 (×5): 500 mg via ORAL
  Filled 2016-01-18 (×9): qty 1

## 2016-01-18 MED ORDER — LABETALOL HCL 5 MG/ML IV SOLN
10.0000 mg | INTRAVENOUS | Status: DC | PRN
  Administered 2016-01-18: 10 mg via INTRAVENOUS
  Filled 2016-01-18: qty 4

## 2016-01-18 MED ORDER — DILTIAZEM HCL ER COATED BEADS 120 MG PO CP24
120.0000 mg | ORAL_CAPSULE | Freq: Every day | ORAL | Status: DC
  Administered 2016-01-18: 120 mg via ORAL
  Filled 2016-01-18: qty 1

## 2016-01-18 NOTE — Progress Notes (Signed)
Physical Therapy Treatment Patient Details Name: [name] MRN: [id number] DOB: 09-Feb-1946 Today's Date: 01/18/2016    History of Present Illness 70 y.o. male admitted to [facility] on 01/14/16 with left sided weakness.  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  Pt with significant PMHx of HTN, GIB, complete heart block, a-fib, and pacemaker.    PT Comments    Pt more alert for small periods of today's session, but still has periods of decreased alertness throughout session requiring sternal rub and verbal cues to become alert again.  He was better able to participate in mobility today physically despite dense left hemiparesis, but he is significantly impaired.  I still think SNF level rehab at discharge is most appropriate.    Follow Up Recommendations  SNF;Supervision/Assistance - 24 hour     Equipment Recommendations  Wheelchair (measurements PT);Wheelchair cushion (measurements PT);3in1 (PT);Hospital bed;Other (comment) (hoyer lift)    Recommendations for Other Services   NA     Precautions / Restrictions Precautions Precautions: Fall Precaution Comments: L hemi, L inattention/neglect    Mobility  Bed Mobility Overal bed mobility: Needs Assistance Bed Mobility: Rolling;Supine to Sit;Sit to Supine Rolling: Max assist   Supine to sit: HOB elevated;Max assist Sit to supine: HOB elevated;Max assist   General bed mobility comments: Pt better able to initiate movement once PT initiates, wants to get back into bed, so very helpful when returning to supine (however, trying to return to supine throughout sitting trial).  Assist needed most on left side, and to help safely and slowly progress trunk up and down.    Modified Rankin (Stroke Patients Only) Modified Rankin (Stroke Patients Only) Pre-Morbid Rankin Score: No symptoms Modified Rankin: Severe disability     Balance Overall balance assessment: Needs assistance Sitting-balance support: Feet  supported;Single extremity supported Sitting balance-Leahy Scale: Poor Sitting balance - Comments: Max down to mod assist to maintain sitting balance EOB, pt at times pushing left with strong right arm and at times leaning right to try to get back into the bed.  Sat EOB x 10 mins. Postural control: Left lateral lean                          Cognition Arousal/Alertness: Lethargic Behavior During Therapy: Flat affect Overall Cognitive Status: Impaired/Different from baseline Area of Impairment: Orientation;Attention;Memory;Following commands;Safety/judgement;Awareness;Problem solving Orientation Level: Disoriented to;Person (pt reported [facility] hospital, but knew hospital, stroke, Mo/yr) Current Attention Level: Focused Memory: Decreased short-term memory Following Commands: Follows one step commands inconsistently Safety/Judgement: Decreased awareness of safety;Decreased awareness of deficits Awareness: Intellectual Problem Solving: Slow processing;Decreased initiation;Difficulty sequencing;Requires verbal cues;Requires tactile cues General Comments: Pt during periods of alertness he was more alert today, however, he goes from responding to not responding in a matter of seconds.  Sternal rub to get him back to alert and participating.  Pt able to give history, but per RN it is not accurate (he is reporting he is married, has a home, etc) RN reports he is homeless           Pertinent Vitals/Pain Pain Assessment: Faces Pain Score: 10-Worst pain ever Faces Pain Scale: Hurts little more Pain Location: side Pain Descriptors / Indicators: Aching;Burning;Grimacing;Guarding Pain Intervention(s): Limited activity within patient's tolerance;Monitored during session;Repositioned           PT Goals (current goals can now be found in the care plan section) Acute Rehab PT Goals Patient Stated Goal: "to die" Progress  towards PT goals: Progressing toward goals    Frequency  Min  4X/week    PT Plan Current plan remains appropriate       End of Session   Activity Tolerance: Patient limited by lethargy Patient left: in bed;with call bell/phone within reach;with bed alarm set     Time: [id number] PT Time Calculation (min) (ACUTE ONLY): 27 min  Charges:  $Therapeutic Activity: 23-37 mins                      [name], [place], DPT [phone]   01/18/2016, 5:09 PM
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
## 2016-01-18 NOTE — Progress Notes (Signed)
STROKE TEAM PROGRESS NOTE   SUBJECTIVE (INTERVAL HISTORY) Patient received dilaudid this am and was sleepy earlier but awake for mu exam  Serum sodium is 159 this morning but drip has been off since this am.  OBJECTIVE Temp:  [97.6 F (36.4 C)-99.4 F (37.4 C)] 97.7 F (36.5 C) (08/10 1200) Pulse Rate:  [30-117] 58 (08/10 1230) Cardiac Rhythm: Atrial fibrillation (08/10 0800) Resp:  [11-25] 14 (08/10 1300) BP: (114-183)/(56-109) 130/56 (08/10 1300) SpO2:  [90 %-100 %] 100 % (08/10 1300)  CBC:   Recent Labs Lab 01/14/16 1602 01/14/16 1612 01/16/16 0440  WBC 9.7  --  10.0  HGB 11.5* 12.9* 11.2*  HCT 38.0* 38.0* 37.1*  MCV 96.7  --  96.9  PLT 209  --  123456    Basic Metabolic Panel:   Recent Labs Lab 01/14/16 1602 01/14/16 1612  01/16/16 0440  01/18/16 0050 01/18/16 0430  NA 138 140  < > 147*  < > 158* 159*  K 4.5 4.5  --  5.1  --   --   --   CL 109 108  --  118*  --   --   --   CO2 21*  --   --  22  --   --   --   GLUCOSE 150* 146*  --  144*  --   --   --   BUN 18 24*  --  20  --   --   --   CREATININE 0.77 0.60*  --  0.61  --   --   --   CALCIUM 9.0  --   --  8.7*  --   --   --   < > = values in this interval not displayed.  Lipid Panel:     Component Value Date/Time   CHOL 104 01/15/2016 0405   TRIG 46 01/15/2016 0405   HDL 31 (L) 01/15/2016 0405   CHOLHDL 3.4 01/15/2016 0405   VLDL 9 01/15/2016 0405   LDLCALC 64 01/15/2016 0405   HgbA1c:  Lab Results  Component Value Date   HGBA1C 6.7 (H) 01/15/2016    IMAGING  Ct Head Wo Contrast  Result Date: 01/17/2016 CLINICAL DATA:  Follow-up exam for acute stroke. EXAM: CT HEAD WITHOUT CONTRAST TECHNIQUE: Contiguous axial images were obtained from the base of the skull through the vertex without intravenous contrast. COMPARISON:  Prior CT from 01/15/2016. FINDINGS: Extensive cytotoxic edema involving a large portion of the right MCA territory is evident. This involves primarily the anterior and middle  distributions, with relative sparing of the posterior division. There is involvement of the right basal ganglia. Additional all cytotoxic edema present within the right ACA territory as well. No evidence for hemorrhagic transformation. Hyperdensity within the right M1 segment likely reflects thrombus. Localized edema and mass effect on the adjacent right ventricle which is attenuated and compressed, similar to prior CT and. There is associated up to 1 cm of midline shift, increased from prior. Mild asymmetric dilatation of the left lateral ventricle is slightly worsened relative to previous study, concerning for possible developing ventricular trapping. Remote left parietal infarct again noted. No other acute or evolving infarct. No acute intracranial hemorrhage. No mass lesion or extra-axial fluid collection. Scalp soft tissues demonstrate no acute abnormality. No acute abnormality about the globes and orbits. Paranasal sinuses are clear.  No mastoid effusion. Calvarium intact. IMPRESSION: 1. Large evolving subacute right MCA territory infarct, with associated smaller right ACA territory infarct. No evidence for  hemorrhagic transformation. 2. Worsened localized edema with increased 11 mm of right-to-left shift. No hydrocephalus. Slightly worsened asymmetric dilatation of the left lateral ventricle concerning for possible developing ventricular trapping. Electronically Signed   By: Jeannine Boga M.D.   On: 01/17/2016 06:35   EEG This awake and drowsy EEG is abnormal due to the presence of: 1. Mild diffuse slowing of the waking background. 2. Focal slowing over the left hemisphere   PHYSICAL EXAM Frail middle-aged [name] currently not in distress. . Afebrile. Head is nontraumatic. Neck is supple without bruit.    Cardiac exam no murmur or gallop. Lungs are clear to auscultation. Distal pulses are well felt.left leg wounds likely scratch marks at insect bite sites. Neurological Exam :  Awake  alert follows commands well. Disoriented x 2.Mild dysarthria but can be understood. Right gaze preference but can look to the left past midline but not all the way. Mild left visual neglect versus hemianopsia. Moderate left lower facial weakness. Tongue midline. Dense left hemiplegia with mild left hemi-neglect and sensory loss. Purposeful antigravity movements on the right. Mild withdrawal of the left lower extremity to pain but no movement in the left upper extremity. Deep tendon reflexes are normal on the right and depressed on the left. Left plantar upgoing right downgoing.   ASSESSMENT/PLAN [name] is a 70 y.o. male with history of AFib, non-hodgkins lymphoma, GIB HLD, HTN and tubular adenoma presenting after a fall off a park bench with L sided weakness . He did not receive IV t-PA or OR due to high ASPECTS score.   Stroke:  Non-dominant large Right MCA infarct in setting of hyperdense R MCA, felt to be embolic secondary to known atrial fibrillation not on anticoagulation  Resultant  Left hemiparesis, left hemisensory loss, left hemianopsia, dysarthria  Initial CT head hyperdense MCA sign  CT Cervical spine no significant traumatic injury to the face or cervical spine  MRI  / MRA  Pacemaker  CTA head and neck Distal right ICA thrombus extending into right M1 and A1 segments. right MCA and right ACA territory infarct. Old left parietal occipital infarct.  Carotid Doppler  Canceled  2D Echo  EF 15%. No source of embolus. Diffuse hypokinesis.  EEG  No definite seizure activity follow up CT  01/17/16 shows Worsened localized edema with increased 11 mm of right-to-left shift. No hydrocephalus. Slightly worsened asymmetric dilatation of the left lateral ventricle concerning for possible developing  ventricular trapping.  LDL 64  HgbA1c 6.7  SCDs for VTE prophylaxis DIET - DYS 1 Room service appropriate? Yes; Fluid consistency: Honey Thick  aspirin 81 mg daily prior to  admission, now on aspirin 325 mg daily  Patient counseled to be compliant with his antithrombotic medications  Ongoing aggressive stroke risk factor management  Therapy recommendations:  Pending. Given large stroke, keep in bed today. May be out of bed in AM if stable.  Disposition:  pending (homeless. recently discharged from jail, no family, reportedly has a girlfriend)  Atrial Fibrillation  Home anticoagulation:  none   Likely, not a good anticoagulation candidate secondary to social situation and compliance  Plan aspirin for 1 week and then change to eliquis if he can afford it  Hypertension  Stable  Permissive hypertension (OK if < 220/120) but gradually normalize in 5-7 days  Long-term BP goal normotensive  Hyperlipidemia  Hx of HLD but not on home meds:  No statin  LDL 65, at goal < 70  Hyperglycemia  No documented  hx  Elevated 146, 150  HgbA1c pending   Other Stroke Risk Factors  Advanced age  Current every day Cigarette smoker, advised to stop smoking  ETOH use, advised to drink no more than 2 drink(s) a day  Marijuana use, UDS not done this admission   Other Active Problems  History non-Hodgkin's lymphoma  History of GI bleeding  History tubular adenoma  History of complete heart block  Lower extremities leg wounds - patient states from insect bite - local pruritis, developing wounds. WOC consulted to evaluate, recommend treatment -> Cleanse left hip wound with normal saline, pat dry. Cover wound bed with Xeroform gauze and secure with foam dressing. Change every other day. (2) Apply protective foam dressing to left malleolus and left knee. Change prn  Chronic systolic heart Failure - EF 15 %     Hospital day # [facility] See Amion for Pager information 01/18/2016 1:28 PM   I have personally examined this patient, reviewed notes, independently viewed imaging studies, participated in medical decision making  and plan of care. I have made any additions or clarifications directly to the above note. Agree with note above.  DC hypertonic saline . Continue aspirin for now may change to anticoagulation after 1 week. No family available.  Start oral Cardizem for rate control. Mobilize out of bed. Rehabilitation consult . This patient is critically ill and at significant risk of neurological worsening, death and care requires constant monitoring of vital signs, hemodynamics,respiratory and cardiac monitoring, extensive review of multiple databases, frequent neurological assessment, discussion with family, other specialists and medical decision making of high complexity.I have made any additions or clarifications directly to the above note.This critical care time does not reflect procedure time, or teaching time or supervisory time of PA/NP/Med Resident etc but could involve care discussion time.  I spent 30 minutes of neurocritical care time  in the care of  this patient.      Antony Contras, MD Medical Director [facility] Pager: [phone] 01/18/2016 1:28 PM   To contact Stroke Continuity provider, please refer to [email or web address] After hours, contact General Neurology

## 2016-01-18 NOTE — Progress Notes (Signed)
Inpatient Diabetes Program Recommendations  AACE/ADA: New Consensus Statement on Inpatient Glycemic Control (2015)  Target Ranges:  Prepandial:   less than 140 mg/dL      Peak postprandial:   less than 180 mg/dL (1-2 hours)      Critically ill patients:  140 - 180 mg/dL   Results for [name], [name] (MRN [id number]) as of 01/18/2016 10:37  Ref. Range 01/14/2016 16:02 01/14/2016 16:12 01/15/2016 04:05 01/16/2016 04:40  Hemoglobin A1C Latest Ref Range: 4.8 - 5.6 %   6.7 (H)   Glucose Latest Ref Range: 65 - 99 mg/dL 150 (H) 146 (H)  144 (H)   Review of Glycemic Control  Diabetes history: No Outpatient Diabetes medications: NA Current orders for Inpatient glycemic control: None  Inpatient Diabetes Program Recommendations:  Correction (SSI): While inpatient, please consider ordering CBGs with Novolog correction sale. Oral Agents: If patient is dx with DM, MD may want to consider ordering oral DM medication while inpatient to determine impact on glycemic control. HgbA1C: A1C 6.7% on 01/15/16. Per ADA criteria, if A1C 6.5% or greater then patient can be dx with DM. MD, please make notation in the chart if patient is being diagnosed with DM. If so, please inform patient and nursing staff so patient can be educated about diabetes.  Thanks, Barnie Alderman, RN, MSN, CDE Diabetes Coordinator Inpatient Diabetes Program [phone] (Team Pager from [place] to [place]) [phone] ([facility] office) [phone] [facility] office) [phone] [facility] office)

## 2016-01-18 NOTE — Progress Notes (Signed)
Physical medicine rehabilitation consult requested chart reviewed. Patient received Dilaudid this morning became severely lethargic requiring Narcan. Follow-up formal rehabilitation consult when patient able to participate.

## 2016-01-19 ENCOUNTER — Inpatient Hospital Stay (HOSPITAL_COMMUNITY): Payer: Medicare Other

## 2016-01-19 DIAGNOSIS — S0990XA Unspecified injury of head, initial encounter: Secondary | ICD-10-CM

## 2016-01-19 DIAGNOSIS — I10 Essential (primary) hypertension: Secondary | ICD-10-CM

## 2016-01-19 DIAGNOSIS — I480 Paroxysmal atrial fibrillation: Secondary | ICD-10-CM

## 2016-01-19 DIAGNOSIS — I739 Peripheral vascular disease, unspecified: Secondary | ICD-10-CM

## 2016-01-19 DIAGNOSIS — R0682 Tachypnea, not elsewhere classified: Secondary | ICD-10-CM

## 2016-01-19 DIAGNOSIS — E875 Hyperkalemia: Secondary | ICD-10-CM

## 2016-01-19 DIAGNOSIS — I69391 Dysphagia following cerebral infarction: Secondary | ICD-10-CM

## 2016-01-19 DIAGNOSIS — I4891 Unspecified atrial fibrillation: Secondary | ICD-10-CM

## 2016-01-19 DIAGNOSIS — I69991 Dysphagia following unspecified cerebrovascular disease: Secondary | ICD-10-CM

## 2016-01-19 DIAGNOSIS — G935 Compression of brain: Secondary | ICD-10-CM

## 2016-01-19 DIAGNOSIS — S0990XD Unspecified injury of head, subsequent encounter: Secondary | ICD-10-CM

## 2016-01-19 DIAGNOSIS — I63411 Cerebral infarction due to embolism of right middle cerebral artery: Principal | ICD-10-CM

## 2016-01-19 DIAGNOSIS — Z720 Tobacco use: Secondary | ICD-10-CM

## 2016-01-19 DIAGNOSIS — R5381 Other malaise: Secondary | ICD-10-CM

## 2016-01-19 DIAGNOSIS — R5383 Other fatigue: Secondary | ICD-10-CM

## 2016-01-19 DIAGNOSIS — R000 Tachycardia, unspecified: Secondary | ICD-10-CM

## 2016-01-19 DIAGNOSIS — I6340 Cerebral infarction due to embolism of unspecified cerebral artery: Secondary | ICD-10-CM

## 2016-01-19 DIAGNOSIS — G936 Cerebral edema: Secondary | ICD-10-CM

## 2016-01-19 DIAGNOSIS — Z5189 Encounter for other specified aftercare: Secondary | ICD-10-CM

## 2016-01-19 DIAGNOSIS — E870 Hyperosmolality and hypernatremia: Secondary | ICD-10-CM

## 2016-01-19 DIAGNOSIS — I442 Atrioventricular block, complete: Secondary | ICD-10-CM

## 2016-01-19 DIAGNOSIS — D62 Acute posthemorrhagic anemia: Secondary | ICD-10-CM

## 2016-01-19 DIAGNOSIS — I5043 Acute on chronic combined systolic (congestive) and diastolic (congestive) heart failure: Secondary | ICD-10-CM

## 2016-01-19 DIAGNOSIS — F17200 Nicotine dependence, unspecified, uncomplicated: Secondary | ICD-10-CM

## 2016-01-19 LAB — GLUCOSE, CAPILLARY
GLUCOSE-CAPILLARY: 98 mg/dL (ref 65–99)
GLUCOSE-CAPILLARY: 116 mg/dL — AB (ref 65–99)
Glucose-Capillary: 118 mg/dL — ABNORMAL HIGH (ref 65–99)

## 2016-01-19 LAB — SODIUM: Sodium: 157 mmol/L — ABNORMAL HIGH (ref 135–145)

## 2016-01-19 MED ORDER — JEVITY 1.2 CAL PO LIQD
1000.0000 mL | ORAL | Status: DC
  Filled 2016-01-19 (×2): qty 1000

## 2016-01-19 MED ORDER — JEVITY 1.2 CAL PO LIQD
1000.0000 mL | ORAL | Status: DC
  Filled 2016-01-19 (×5): qty 1000

## 2016-01-19 NOTE — Progress Notes (Signed)
Speech Language Pathology Treatment: Dysphagia  Patient Details Name: Thayer Yazdani MRN: [id number] DOB: April 13, 1946 Today's Date: 01/19/2016 Time: [place]:[id number] SLP Time Calculation (min) (ACUTE ONLY): 14 min  Assessment / Plan / Recommendation Clinical Impression  Arousal remains poor; per RN pt has not taken any oral medication or PO today and MD will order a Cortrak. SLP repositioned pt, provided oral and max cues to improve arousal and awareness. Head support was poor though pt was verbalizing need to pee. Total assist for head support provided while pt given one teaspoon of pudding. Pt manipulated and transited bolus, but swallow weak and delayed followed by coughing and need for oral suction due to severe residuals. Pt is not appropriate for PO unless mentation and responsiveness improves. Will follow for PO trials as tolerated.    HPI HPI: This is a 89-yo man who was in jail.Pt found to have  EMS reported that he got into a "scuffle" with officers at the jail. They reportedly carried him out to a bench. He fell off of the bench and EMS was activated. They arrived to find the patient with left-sided weakness and abrasions over his face. He was initially thought to be a trauma but on arrival to ED the attending called [facility]. CT shows infarct in right frontal and parietal      SLP Plan  Continue with current plan of care     Recommendations  Diet recommendations: NPO             Plan: Continue with current plan of care     GO                Tamara Monteith, Katherene Ponto 01/19/2016, 12:08 PM

## 2016-01-19 NOTE — Progress Notes (Signed)
Nutrition Follow-up / Consult  DOCUMENTATION CODES:   Severe malnutrition in context of social or environmental circumstances, Underweight  INTERVENTION:    Initiate TF via Cortrak tube with Jevity 1.2 at 30 ml/h, increase by 10 ml every 4 hours to goal rate of 60 ml/h to provide 1728 kcals, 80 gm protein, 1166 ml free water daily.  NUTRITION DIAGNOSIS:   Malnutrition (Severe) related to social / environmental circumstances as evidenced by severe depletion of body fat, severe depletion of muscle mass.  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 Pt found to have acute ischemic R MCA stroke.   Diet was advanced to dysphagia 1 with honey thick liquids on 8/7. Patient was made NPO this morning due to decline in mental status with decreased alertness. Cortrak tube to be placed today for TF and medications. Received MD Consult for TF initiation and management.  Labs and medications reviewed.   Diet Order:  Diet NPO time specified  Skin:  Wound (see comment) (stage II pressure injury to hip)  Last BM:  8/8  Height:   Ht Readings from Last 1 Encounters:  01/14/16 5\' 9"  (1.753 m)    Weight:   Wt Readings from Last 1 Encounters:  01/14/16 109 lb 12.6 oz (49.8 kg)    Ideal Body Weight:  72.7 kg  BMI:  Body mass index is 16.21 kg/m.  Estimated Nutritional Needs:   Kcal:  1500-1700  Protein:  70-80 gm  Fluid:  1.7 L  EDUCATION NEEDS:   No education needs identified at this time  Molli Barrows, [place], [place], [place] Pager [phone] After Hours Pager [phone]

## 2016-01-19 NOTE — NC FL2 (Signed)
[place] LEVEL OF CARE SCREENING TOOL     IDENTIFICATION  Patient Name: Levelle Balsamo Birthdate: 02/19/1946 Sex: male Admission Date (Current Location): 01/14/2016  [facility] and [place] Number:  Herbalist and Address:  [facility] [place], [place] [address] [place], [place] [address]      Provider Number: [id number]  Attending Physician Name and Address:  Garvin Fila, MD  Relative Name and Phone Number:       Current Level of Care: Hospital Recommended Level of Care: [place] Prior Approval Number:    Date Approved/Denied:   PASRR Number:    Discharge Plan: SNF    Current Diagnoses: Patient Active Problem List   Diagnosis Date Noted  . Acute blood loss anemia   . Acute on chronic combined systolic and diastolic heart failure ([place])   . Benign essential HTN   . Complete heart block ([place])   . Dysphagia, post-stroke   . Head injury   . Hyperkalemia   . Hypernatremia   . Lethargy   . PAD (peripheral artery disease) ([place])   . Tachycardia   . Tachypnea   . Tobacco abuse   . Pressure ulcer 01/17/2016  . Cytotoxic brain edema ([place]) 01/17/2016  . Brain herniation ([place]) 01/17/2016  . Atrial fibrillation ([place]) 01/17/2016  . Stroke due to embolism of right middle cerebral artery ([place]) 01/14/2016    Orientation RESPIRATION BLADDER Height & Weight     Self, Time, Situation, Place  Normal Incontinent Weight: 109 lb 12.6 oz (49.8 kg) Height:  5\' 9"  (175.3 cm)  BEHAVIORAL SYMPTOMS/MOOD NEUROLOGICAL BOWEL NUTRITION STATUS      Incontinent Diet (Please see discharge summary.)  AMBULATORY STATUS COMMUNICATION OF NEEDS Skin   Extensive Assist Verbally PU Stage and Appropriate Care   PU Stage 2 Dressing: BID                   Personal Care Assistance Level of Assistance  Bathing, Feeding, Dressing Bathing Assistance: Maximum assistance Feeding assistance: Maximum assistance Dressing Assistance: Maximum  assistance     Functional Limitations Info             SPECIAL CARE FACTORS FREQUENCY  PT (By licensed PT), OT (By licensed OT)     PT Frequency: 5 OT Frequency: 5            Contractures      Additional Factors Info  Code Status, Allergies Code Status Info: FULL Allergies Info: No known allergies           Current Medications (01/19/2016):  This is the current hospital active medication list Current Facility-Administered Medications  Medication Dose Route Frequency Provider Last Rate Last Dose  .  stroke: mapping our early stages of recovery book   Does not apply Once Darrel Reach, MD      . 0.9 %  sodium chloride infusion   Intravenous Continuous Donzetta Starch, NP 30 mL/hr at 01/17/16 1853    . acetaminophen (TYLENOL) tablet 650 mg  650 mg Oral Q6H PRN Donzetta Starch, NP      . antiseptic oral rinse (CPC / CETYLPYRIDINIUM CHLORIDE 0.05%) solution 7 mL  7 mL Mouth Rinse q12n4p Garvin Fila, MD   7 mL at 01/18/16 1000  . aspirin suppository 300 mg  300 mg Rectal Daily Darrel Reach, MD   Stopped at 01/15/16 1000   Or  . aspirin tablet 325 mg  325 mg  Oral Daily Darrel Reach, MD   325 mg at 01/18/16 1116  . chlorhexidine (PERIDEX) 0.12 % solution 15 mL  15 mL Mouth Rinse BID Darrel Reach, MD   15 mL at 01/18/16 1117  . diltiazem (CARDIZEM CD) 24 hr capsule 120 mg  120 mg Oral Daily Donzetta Starch, NP   120 mg at 01/18/16 1117  . divalproex (DEPAKOTE ER) 24 hr tablet 500 mg  500 mg Oral Daily Donzetta Starch, NP   500 mg at 01/18/16 1117  . enoxaparin (LOVENOX) injection 40 mg  40 mg Subcutaneous Q24H Donzetta Starch, NP   40 mg at 01/18/16 1117  . feeding supplement (JEVITY 1.2 CAL) liquid 1,000 mL  1,000 mL Per Tube Continuous Garvin Fila, MD      . hydrOXYzine (ATARAX/VISTARIL) tablet 25 mg  25 mg Oral Q6H PRN Wallie Char   25 mg at 01/16/16 [id number]  . labetalol (NORMODYNE,TRANDATE) injection 10 mg  10 mg Intravenous Q2H PRN Wallie Char    10 mg at 01/18/16 2214  . RESOURCE THICKENUP CLEAR   Oral PRN Garvin Fila, MD         Discharge Medications: Please see discharge summary for a list of discharge medications.  Relevant Imaging Results:  Relevant Lab Results:   Additional Information    Caroline Sauger, LCSW

## 2016-01-19 NOTE — Progress Notes (Signed)
Physical Therapy Treatment Patient Details Name: [name] MRN: [id number] DOB: 12-12-1945 Today's Date: 01/19/2016    History of Present Illness 70 y.o. male admitted to [facility] on 01/14/16 with left sided weakness.  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  Pt with significant PMHx of HTN, GIB, complete heart block, a-fib, and pacemaker.    PT Comments    Pt not making much progress towards goals.  Frequency updated to reflect strong recommendation of SNF placement at discharge.  PT will continue to follow acutely to try to help pt progress his mobility.   Follow Up Recommendations  SNF;Supervision/Assistance - 24 hour     Equipment Recommendations  Wheelchair (measurements PT);Wheelchair cushion (measurements PT);3in1 (PT);Hospital bed;Other (comment) (hoyer lift)    Recommendations for Other Services   NA     Precautions / Restrictions Precautions Precautions: Fall Precaution Comments: L hemi, L inattention/neglect    Mobility  Bed Mobility Overal bed mobility: Needs Assistance Bed Mobility: Supine to Sit;Sit to Supine     Supine to sit: +2 for physical assistance;Total assist;HOB elevated Sit to supine: +2 for safety/equipment;Total assist   General bed mobility comments: Pt not initiating to help with movement in either direction up or down  Transfers Overall transfer level: Needs assistance Equipment used: None Transfers: Sit to/from Stand Sit to Stand: +2 physical assistance;Total assist         General transfer comment: Pt unable to provide any help in standing at trunk or stronger right leg.  Therapists supporting him completely, left leg blocked knowing that is his weaker side.    Ambulation/Gait             General Gait Details: unable at this time       Modified Rankin (Stroke Patients Only) Modified Rankin (Stroke Patients Only) Pre-Morbid Rankin Score: No symptoms Modified Rankin: Severe disability     Balance  Overall balance assessment: Needs assistance Sitting-balance support: Feet supported;Single extremity supported Sitting balance-Leahy Scale: Zero Sitting balance - Comments: up to total assist EOB, at times pt pushing hard to the left with his right upper extremity, when he is not pushing he doesn't support himself at all.  Postural control: Left lateral lean Standing balance support: No upper extremity supported Standing balance-Leahy Scale: Zero Standing balance comment: unable to help stabilize when brought up to standing by PT                    Cognition Arousal/Alertness: Lethargic Behavior During Therapy: Flat affect Overall Cognitive Status: Impaired/Different from baseline Area of Impairment: Orientation;Memory;Attention;Following commands;Safety/judgement;Awareness;Problem solving Orientation Level: Disoriented to;Person;Time;Place;Situation Current Attention Level: Focused Memory: Decreased short-term memory Following Commands: Follows one step commands inconsistently Safety/Judgement: Decreased awareness of safety;Decreased awareness of deficits Awareness: Intellectual Problem Solving: Slow processing;Decreased initiation;Difficulty sequencing;Requires verbal cues;Requires tactile cues General Comments: Pt more lethargic today than last session.  Not very responsive to questions, needs constant stimulation to even keep his eyes open.             Pertinent Vitals/Pain Pain Assessment: Faces Faces Pain Scale: Hurts little more Pain Location: right thigh Pain Descriptors / Indicators: Aching;Grimacing (pt rubbing his right upper thigh) Pain Intervention(s): Limited activity within patient's tolerance;Monitored during session;Repositioned           PT Goals (current goals can now be found in the care plan section) Acute Rehab PT Goals Patient Stated Goal: unable to state today Progress towards PT goals: Progressing toward goals  Frequency  Min 3X/week     PT Plan Frequency needs to be updated       End of Session Equipment Utilized During Treatment: Gait belt Activity Tolerance: Patient limited by fatigue;Patient limited by lethargy Patient left: in bed;with call bell/phone within reach     Time: 1454-1511 PT Time Calculation (min) (ACUTE ONLY): 17 min  Charges:  $Therapeutic Activity: 8-22 mins           Nicha Hemann B. [place], [place], DPT [phone]   01/19/2016, 5:35 PM

## 2016-01-19 NOTE — Care Management Important Message (Signed)
Important Message  Patient Details  Name: [name] MRN: [id number] Date of Birth: 26-Sep-1945   Medicare Important Message Given:  Yes    Loann Quill 01/19/2016, 1:29 PM

## 2016-01-19 NOTE — Clinical Social Work Placement (Signed)
   CLINICAL SOCIAL WORK PLACEMENT  NOTE  Date:  01/19/2016  Patient Details  Name: [name] MRN: [id number] Date of Birth: August 14, 1945  Clinical Social Work is seeking post-discharge placement for this patient at the [place] level of care (*CSW will initial, date and re-position this form in  chart as items are completed):  Yes   Patient/family provided with [facility] Work Department's list of facilities offering this level of care within the geographic area requested by the patient (or if unable, by the patient's family).  Yes   Patient/family informed of their freedom to choose among providers that offer the needed level of care, that participate in Medicare, Medicaid or managed care program needed by the patient, have an available bed and are willing to accept the patient.  Yes   Patient/family informed of [facility]'s ownership interest in [facility] and [facility], as well as of the fact that they are under no obligation to receive care at these facilities.  PASRR submitted to EDS on       PASRR number received on       Existing PASRR number confirmed on       FL2 transmitted to all facilities in geographic area requested by pt/family on 01/19/16     FL2 transmitted to all facilities within larger geographic area on       Patient informed that his/her managed care company has contracts with or will negotiate with certain facilities, including the following:            Patient/family informed of bed offers received.  Patient chooses bed at       Physician recommends and patient chooses bed at      Patient to be transferred to   on  .  Patient to be transferred to facility by       Patient family notified on   of transfer.  Name of family member notified:        PHYSICIAN Please sign FL2     Additional Comment:    _______________________________________________ Caroline Sauger, LCSW 01/19/2016, 3:11 PM

## 2016-01-19 NOTE — Progress Notes (Signed)
Cortrak tube placed in patients left nare at 80 cm. Patient did not tolerate placement well, moving head throughout insertion. Xray ordered and tube added to assessment. Please contact cortrak team with any questions or concerns.

## 2016-01-19 NOTE — Progress Notes (Addendum)
STROKE TEAM PROGRESS NOTE   SUBJECTIVE (INTERVAL HISTORY) Patient  awake for my exam  Serum sodium is 157 this morning but drip has been off since this am.  OBJECTIVE Temp:  [97.4 F (36.3 C)-98.9 F (37.2 C)] 98 F (36.7 C) (08/11 1057) Pulse Rate:  [25-110] 110 (08/10 2200) Cardiac Rhythm: Atrial fibrillation;Atrial flutter (08/11 0745) Resp:  [8-26] 10 (08/11 0200) BP: (130-179)/(56-133) 167/76 (08/11 0200) SpO2:  [97 %-100 %] 100 % (08/11 0000)  CBC:   Recent Labs Lab 01/14/16 1602 01/14/16 1612 01/16/16 0440  WBC 9.7  --  10.0  HGB 11.5* 12.9* 11.2*  HCT 38.0* 38.0* 37.1*  MCV 96.7  --  96.9  PLT 209  --  123456    Basic Metabolic Panel:   Recent Labs Lab 01/14/16 1602 01/14/16 1612  01/16/16 0440  01/18/16 0430 01/19/16 0437  NA 138 140  < > 147*  < > 159* 157*  K 4.5 4.5  --  5.1  --   --   --   CL 109 108  --  118*  --   --   --   CO2 21*  --   --  22  --   --   --   GLUCOSE 150* 146*  --  144*  --   --   --   BUN 18 24*  --  20  --   --   --   CREATININE 0.77 0.60*  --  0.61  --   --   --   CALCIUM 9.0  --   --  8.7*  --   --   --   < > = values in this interval not displayed.  Lipid Panel:     Component Value Date/Time   CHOL 104 01/15/2016 0405   TRIG 46 01/15/2016 0405   HDL 31 (L) 01/15/2016 0405   CHOLHDL 3.4 01/15/2016 0405   VLDL 9 01/15/2016 0405   LDLCALC 64 01/15/2016 0405   HgbA1c:  Lab Results  Component Value Date   HGBA1C 6.7 (H) 01/15/2016    IMAGING  No results found. EEG This awake and drowsy EEG is abnormal due to the presence of: 1. Mild diffuse slowing of the waking background. 2. Focal slowing over the left hemisphere   PHYSICAL EXAM Frail middle-aged [name] currently not in distress. . Afebrile. Head is nontraumatic. Neck is supple without bruit.    Cardiac exam no murmur or gallop. Lungs are clear to auscultation. Distal pulses are well felt.left leg wounds likely scratch marks at insect bite  sites. Neurological Exam :  Awake alert follows commands well. Disoriented x 2.Mild dysarthria but can be understood. Right gaze preference but can look to the left past midline but not all the way. Mild left visual neglect versus hemianopsia. Moderate left lower facial weakness. Tongue midline. Dense left hemiplegia with mild left hemi-neglect and sensory loss. Purposeful antigravity movements on the right. Mild withdrawal of the left lower extremity to pain but no movement in the left upper extremity. Deep tendon reflexes are normal on the right and depressed on the left. Left plantar upgoing right downgoing.   ASSESSMENT/PLAN Mr. Hikeem Sterman is a 70 y.o. male with history of AFib, non-hodgkins lymphoma, GIB HLD, HTN and tubular adenoma presenting after a fall off a park bench with L sided weakness . He did not receive IV t-PA or OR due to high ASPECTS score.   Stroke:  Non-dominant large Right MCA infarct in  setting of hyperdense R MCA, felt to be embolic secondary to known atrial fibrillation not on anticoagulation  Resultant  Left hemiparesis, left hemisensory loss, left hemianopsia, dysarthria  Initial CT head hyperdense MCA sign  CT Cervical spine no significant traumatic injury to the face or cervical spine  MRI  / MRA  Pacemaker  CTA head and neck Distal right ICA thrombus extending into right M1 and A1 segments. right MCA and right ACA territory infarct. Old left parietal occipital infarct.  Carotid Doppler  Canceled  2D Echo  EF 15%. No source of embolus. Diffuse hypokinesis.  EEG  No definite seizure activity follow up CT  01/17/16 shows Worsened localized edema with increased 11 mm of right-to-left shift. No hydrocephalus. Slightly worsened asymmetric dilatation of the left lateral ventricle concerning for possible developing  ventricular trapping.  LDL 64  HgbA1c 6.7  SCDs for VTE prophylaxis Diet NPO time specified  aspirin 81 mg daily prior to admission, now on  aspirin 325 mg daily  Patient counseled to be compliant with his antithrombotic medications  Ongoing aggressive stroke risk factor management  Therapy recommendations:  Pending. Given large stroke, keep in bed today. May be out of bed in AM if stable.  Disposition:  pending (homeless. recently discharged from jail, no family, reportedly has a girlfriend)  Atrial Fibrillation  Home anticoagulation:  none   Likely, not a good anticoagulation candidate secondary to social situation and compliance  Plan aspirin for 1 week and then change to eliquis if he can afford it  Hypertension  Stable  Permissive hypertension (OK if < 220/120) but gradually normalize in 5-7 days  Long-term BP goal normotensive  Hyperlipidemia  Hx of HLD but not on home meds:  No statin  LDL 65, at goal < 70  Hyperglycemia  No documented hx  Elevated 146, 150  HgbA1c 6.7  Other Stroke Risk Factors  Advanced age  Current every day Cigarette smoker, advised to stop smoking  ETOH use, advised to drink no more than 2 drink(s) a day  Marijuana use, UDS not done this admission   Other Active Problems  History non-Hodgkin's lymphoma  History of GI bleeding  History tubular adenoma  History of complete heart block  Lower extremities leg wounds - patient states from insect bite - local pruritis, developing wounds. WOC consulted to evaluate, recommend treatment -> Cleanse left hip wound with normal saline, pat dry. Cover wound bed with Xeroform gauze and secure with foam dressing. Change every other day. (2) Apply protective foam dressing to left malleolus and left knee. Change prn  Chronic systolic heart Failure - EF 15 %     Hospital day # 5  I have personally examined this patient, reviewed notes, independently viewed imaging studies, participated in medical decision making and plan of care. I have made any additions or clarifications directly to the above note. Plan to place panda tube for  nutrition and medications. PEG tube for next week. Family is not available at bedside. Check CBC and CMP tomorrow morning.  This patient is critically ill and at significant risk of neurological worsening, death and care requires constant monitoring of vital signs, hemodynamics,respiratory and cardiac monitoring, extensive review of multiple databases, frequent neurological assessment, discussion with family, other specialists and medical decision making of high complexity.I have made any additions or clarifications directly to the above note.This critical care time does not reflect procedure time, or teaching time or supervisory time of PA/NP/Med Resident etc but could involve care discussion  time.  I spent 30 minutes of neurocritical care time  in the care of  this patient.       Antony Contras, MD Medical Director [facility] Stroke Center Pager: [phone] 01/19/2016 2:35 PM   To contact Stroke Continuity provider, please refer to [email or web address] After hours, contact General Neurology
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
## 2016-01-19 NOTE — Care Management Important Message (Signed)
Important Message  Patient Details  Name: [name] MRN: [id number] Date of Birth: 06/03/1946   Medicare Important Message Given:  Yes    Nathen May 01/19/2016, 10:46 AM

## 2016-01-19 NOTE — Care Management Note (Signed)
Case Management Note  Patient Details  Name: [name] MRN: [id number] Date of Birth: 04/09/1946  Subjective/Objective:  Patient presents with Right MCA infarct, patient is for snf, CSW following.                   Action/Plan:   Expected Discharge Date:                  Expected Discharge Plan:  Skilled Nursing Facility  In-House Referral:  Clinical Social Work  Discharge planning Services  CM Consult  Post Acute Care Choice:    Choice offered to:     DME Arranged:    DME Agency:     HH Arranged:    [place] Agency:     Status of Service:  In process, will continue to follow  If discussed at Long Length of Stay Meetings, dates discussed:    Additional Comments:  Zenon Mayo, RN 01/19/2016, 5:00 PM

## 2016-01-19 NOTE — Progress Notes (Signed)
Inpatient Rehabilitation  Will plan to follow along at a distance for medically stability and increased ability to tolerate therapies; see MD rehab consult for full details.  However, at this time MD is recommending SNF level of post acute rehab.  One of my team members will follow up next week.  Please call with questions.    Carmelia Roller., CCC/SLP Admission Coordinator  [place]  Cell [phone]

## 2016-01-20 ENCOUNTER — Inpatient Hospital Stay (HOSPITAL_COMMUNITY): Payer: Medicare Other

## 2016-01-20 DIAGNOSIS — J9601 Acute respiratory failure with hypoxia: Secondary | ICD-10-CM

## 2016-01-20 DIAGNOSIS — Z01818 Encounter for other preprocedural examination: Secondary | ICD-10-CM

## 2016-01-20 LAB — COMPREHENSIVE METABOLIC PANEL
ALK PHOS: 73 U/L (ref 38–126)
ALK PHOS: 72 U/L (ref 38–126)
ALT: 26 U/L (ref 17–63)
ALT: 24 U/L (ref 17–63)
ANION GAP: 12 (ref 5–15)
ANION GAP: 12 (ref 5–15)
AST: 25 U/L (ref 15–41)
AST: 23 U/L (ref 15–41)
Albumin: 2.8 g/dL — ABNORMAL LOW (ref 3.5–5.0)
Albumin: 2.8 g/dL — ABNORMAL LOW (ref 3.5–5.0)
BILIRUBIN TOTAL: 1 mg/dL (ref 0.3–1.2)
BUN: 32 mg/dL — ABNORMAL HIGH (ref 6–20)
BUN: 32 mg/dL — ABNORMAL HIGH (ref 6–20)
CALCIUM: 8.8 mg/dL — AB (ref 8.9–10.3)
CALCIUM: 8.3 mg/dL — AB (ref 8.9–10.3)
CHLORIDE: 117 mmol/L — AB (ref 101–111)
CO2: 28 mmol/L (ref 22–32)
CO2: 25 mmol/L (ref 22–32)
CREATININE: 0.75 mg/dL (ref 0.61–1.24)
Chloride: 111 mmol/L (ref 101–111)
Creatinine, Ser: 1.02 mg/dL (ref 0.61–1.24)
GFR calc non Af Amer: >60 mL/min (ref >60)
Glucose, Bld: 171 mg/dL — ABNORMAL HIGH (ref 65–99)
Glucose, Bld: 225 mg/dL — ABNORMAL HIGH (ref 65–99)
POTASSIUM: 3.5 mmol/L (ref 3.5–5.1)
Potassium: 3.3 mmol/L — ABNORMAL LOW (ref 3.5–5.1)
SODIUM: 157 mmol/L — AB (ref 135–145)
SODIUM: 148 mmol/L — AB (ref 135–145)
TOTAL PROTEIN: 6.7 g/dL (ref 6.5–8.1)
Total Bilirubin: 0.7 mg/dL (ref 0.3–1.2)
Total Protein: 6.4 g/dL — ABNORMAL LOW (ref 6.5–8.1)

## 2016-01-20 LAB — CBC WITH DIFFERENTIAL/PLATELET
BASOS ABS: 0 10*3/uL (ref 0.0–0.1)
BASOS PCT: 0 %
EOS ABS: 0 10*3/uL (ref 0.0–0.7)
Eosinophils Relative: 0 %
HEMATOCRIT: 39.9 % (ref 39.0–52.0)
HEMOGLOBIN: 12 g/dL — AB (ref 13.0–17.0)
Lymphocytes Relative: 11 %
Lymphs Abs: 1.3 10*3/uL (ref 0.7–4.0)
MCH: 29.3 pg (ref 26.0–34.0)
MCHC: 30.1 g/dL (ref 30.0–36.0)
MCV: 97.6 fL (ref 78.0–100.0)
Monocytes Absolute: 0.8 10*3/uL (ref 0.1–1.0)
Monocytes Relative: 6 %
NEUTROS ABS: 10.5 10*3/uL — AB (ref 1.7–7.7)
NEUTROS PCT: 83 %
Platelets: 190 10*3/uL (ref 150–400)
RBC: 4.09 MIL/uL — AB (ref 4.22–5.81)
RDW: 16.4 % — ABNORMAL HIGH (ref 11.5–15.5)
WBC: 12.6 10*3/uL — AB (ref 4.0–10.5)

## 2016-01-20 LAB — GLUCOSE, CAPILLARY
GLUCOSE-CAPILLARY: 141 mg/dL — AB (ref 65–99)
GLUCOSE-CAPILLARY: 151 mg/dL — AB (ref 65–99)
GLUCOSE-CAPILLARY: 157 mg/dL — AB (ref 65–99)
GLUCOSE-CAPILLARY: 163 mg/dL — AB (ref 65–99)
GLUCOSE-CAPILLARY: 182 mg/dL — AB (ref 65–99)

## 2016-01-20 LAB — CBC
HEMATOCRIT: 41.6 % (ref 39.0–52.0)
HEMOGLOBIN: 12.5 g/dL — AB (ref 13.0–17.0)
MCH: 29 pg (ref 26.0–34.0)
MCHC: 30 g/dL (ref 30.0–36.0)
MCV: 96.5 fL (ref 78.0–100.0)
Platelets: 202 10*3/uL (ref 150–400)
RBC: 4.31 MIL/uL (ref 4.22–5.81)
RDW: 16.3 % — ABNORMAL HIGH (ref 11.5–15.5)
WBC: 17.5 10*3/uL — ABNORMAL HIGH (ref 4.0–10.5)

## 2016-01-20 LAB — SODIUM: Sodium: 152 mmol/L — ABNORMAL HIGH (ref 135–145)

## 2016-01-20 MED ORDER — PHENYLEPHRINE HCL 10 MG/ML IJ SOLN
30.0000 ug/min | INTRAVENOUS | Status: DC
  Filled 2016-01-20: qty 1

## 2016-01-20 MED ORDER — DILTIAZEM LOAD VIA INFUSION: 10.0000 mg | Freq: Once | INTRAVENOUS | Status: DC

## 2016-01-20 MED ORDER — SODIUM CHLORIDE 0.9 % IV BOLUS (SEPSIS)
500.0000 mL | Freq: Once | INTRAVENOUS | Status: AC
  Administered 2016-01-20: 500 mL via INTRAVENOUS

## 2016-01-20 MED ORDER — VANCOMYCIN HCL IN DEXTROSE 1-5 GM/200ML-% IV SOLN
1000.0000 mg | INTRAVENOUS | Status: DC
  Administered 2016-01-20 – 2016-01-23 (×4): 1000 mg via INTRAVENOUS
  Filled 2016-01-20 (×4): qty 200

## 2016-01-20 MED ORDER — IPRATROPIUM-ALBUTEROL 0.5-2.5 (3) MG/3ML IN SOLN
3.0000 mL | Freq: Four times a day (QID) | RESPIRATORY_TRACT | Status: DC
  Administered 2016-01-20 – 2016-01-23 (×13): 3 mL via RESPIRATORY_TRACT
  Filled 2016-01-20 (×12): qty 3

## 2016-01-20 MED ORDER — POTASSIUM CHLORIDE 10 MEQ/100ML IV SOLN
10.0000 meq | INTRAVENOUS | Status: AC
  Administered 2016-01-20 (×4): 10 meq via INTRAVENOUS
  Filled 2016-01-20 (×4): qty 100

## 2016-01-20 MED ORDER — FENTANYL CITRATE (PF) 100 MCG/2ML IJ SOLN
25.0000 ug | INTRAMUSCULAR | Status: DC | PRN
  Administered 2016-01-20 (×2): 25 ug via INTRAVENOUS
  Administered 2016-01-20: 50 ug via INTRAVENOUS
  Administered 2016-01-21 (×3): 25 ug via INTRAVENOUS
  Administered 2016-01-22 – 2016-01-23 (×10): 100 ug via INTRAVENOUS
  Filled 2016-01-20 (×15): qty 2

## 2016-01-20 MED ORDER — PIPERACILLIN-TAZOBACTAM 3.375 G IVPB
3.3750 g | Freq: Three times a day (TID) | INTRAVENOUS | Status: AC
  Administered 2016-01-20 – 2016-01-26 (×19): 3.375 g via INTRAVENOUS
  Filled 2016-01-20 (×23): qty 50

## 2016-01-20 MED ORDER — FAMOTIDINE IN NACL 20-0.9 MG/50ML-% IV SOLN
20.0000 mg | Freq: Two times a day (BID) | INTRAVENOUS | Status: DC
  Administered 2016-01-20 – 2016-01-21 (×4): 20 mg via INTRAVENOUS
  Filled 2016-01-20 (×4): qty 50

## 2016-01-20 MED ORDER — FUROSEMIDE 10 MG/ML IJ SOLN
40.0000 mg | Freq: Once | INTRAMUSCULAR | Status: AC
Start: 2016-01-20 — End: 2016-01-20
  Administered 2016-01-20: 40 mg via INTRAVENOUS
  Filled 2016-01-20: qty 4

## 2016-01-20 MED ORDER — PIPERACILLIN-TAZOBACTAM 3.375 G IVPB
3.3750 g | Freq: Three times a day (TID) | INTRAVENOUS | Status: DC
  Filled 2016-01-20 (×2): qty 50

## 2016-01-20 MED ORDER — DILTIAZEM HCL 100 MG IV SOLR
5.0000 mg/h | INTRAVENOUS | Status: DC
  Administered 2016-01-21: 5 mg/h via INTRAVENOUS
  Administered 2016-01-21 – 2016-01-22 (×3): 12.5 mg/h via INTRAVENOUS
  Filled 2016-01-20 (×4): qty 100

## 2016-01-20 MED ORDER — DILTIAZEM HCL 60 MG PO TABS
60.0000 mg | ORAL_TABLET | Freq: Two times a day (BID) | ORAL | Status: DC
  Administered 2016-01-20 (×3): 60 mg via ORAL
  Filled 2016-01-20 (×3): qty 1

## 2016-01-20 MED ORDER — PIPERACILLIN SOD-TAZOBACTAM SO 2.25 (2-0.25) G IV SOLR: 75.0000 mg/kg | Freq: Three times a day (TID) | INTRAVENOUS | Status: DC
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
## 2016-01-20 NOTE — Progress Notes (Signed)
Pt with increased work of breathing, accessory muscle use, and tachypnea RR 30-40s. Very rhochus sounding. Tube feedings stopped. Assessment shows no response to verbal or painful stimuli, does not withdraw from pain. Neurology- Dr. Tasia Catchings paged. CXR and ABG obtained. NTS pulled thick, off white secretions. Pt with low grade temp. CCM consulted.
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
## 2016-01-20 NOTE — Progress Notes (Signed)
[facility] Progress Note Patient Name: [name] DOB: 22-May-1946 MRN: [id number]   Date of Service  01/20/2016  HPI/Events of Note  Notified of need for stress ulcer prophylaxis.   eICU Interventions  Will order: 1. Pepcid 20 mg IV now and Q 12 hours.      Intervention Category Intermediate Interventions: Best-practice therapies (e.g. DVT, beta blocker, etc.)  Nyiesha Beever Eugene 01/20/2016, 3:24 PM

## 2016-01-20 NOTE — Procedures (Signed)
Intubation Procedure Note Sammy Tannen [id number] 1946/05/23  Procedure: Intubation Indications: worening acute encephalopathy and respiratory failure  Procedure Details Consent: Unable to obtain consent because of emergent medical necessity. Time Out: Verified patient identification, verified procedure, site/side was marked, verified correct patient position, special equipment/implants available, medications/allergies/relevent history reviewed, required imaging and test results available.  Performed  Maximum sterile technique was used including cap, gloves, gown, hand hygiene and mask.  MAC  glidescope used - good visual of airway  Evaluation Hemodynamic Status: BP stable throughout; O2 sats: stable throughout Patient's Current Condition: stable Complications: No apparent complications Patient did tolerate procedure well. Chest X-ray ordered to verify placement.  CXR: pending.   Silvano Garofano 01/20/2016  Dr. Brand Males, M.D., F.C.C.P Pulmonary and Critical Care Medicine Staff Physician [name] Pager: [phone], If no answer or between  15:00h - 7:00h: call [phone]  01/20/2016 10:37 AM

## 2016-01-20 NOTE — Progress Notes (Signed)
Pt taken down for head CT on 100% Fi02. Pt remained stable throughout. No complications noted

## 2016-01-20 NOTE — Progress Notes (Signed)
Pharmacy Antibiotic Note  [name] is a 70 y.o. male admitted on 01/14/2016 with stroke.  Today pt has developed worsening mental status and increased work of breathing requiring intubation.  Concern for aspiration pneumonia supported by new CXR.  Pharmacy has been consulted for zosyn and vancomycin dosing. SCr 0.75, CrCl ~47.   Plan: Vancomycin 1000mg  IV every 24 hours.  Goal trough 15-20 mcg/mL. Zosyn 3.375g IV q8h (4 hour infusion).  Height: 5\' 9"  (175.3 cm) Weight: 103 lb 9.9 oz (47 kg) IBW/kg (Calculated) : 70.7  Temp (24hrs), Avg:98.9 F (37.2 C), Min:98 F (36.7 C), Max:100.6 F (38.1 C)   Recent Labs Lab 01/14/16 1602 01/14/16 1612 01/16/16 0440 01/20/16 0514  WBC 9.7  --  10.0 12.6*  CREATININE 0.77 0.60* 0.61 0.75  LATICACIDVEN  --  1.95*  --   --     Estimated Creatinine Clearance: 58.8 mL/min (by C-G formula based on SCr of 0.8 mg/dL).    No Known Allergies  Antimicrobials this admission: 8/12 Zosyn >>  8/12 Vanc >>  Dose adjustments this admission: n/a  Microbiology results: 8/6 MRSA PCR: negative  Thank you for allowing pharmacy to be a part of this patient's care.  Myer Peer Grayland Ormond), PharmD  PGY1 Pharmacy Resident Pager: [phone] 01/20/2016 10:16 AM  I discussed / reviewed the pharmacy note by Dr. Tracey Harries and I agree with the resident's findings and plans as documented.  [employer], Pharm.D., BCPS Clinical Pharmacist Pager [phone] 01/20/2016 12:13 PM

## 2016-01-20 NOTE — Progress Notes (Signed)
[facility] Progress Note Patient Name: [name] DOB: 1945/08/07 MRN: [id number]   Date of Service  01/20/2016  HPI/Events of Note  Atrial arrythmia with poor HR control on oral cardizem.  Systolic BP [id number].  eICU Interventions  Stop oral cardizem and start cardizem drip     Intervention Category Intermediate Interventions: Arrhythmia - evaluation and management  Mauri Brooklyn, P 01/20/2016, 11:35 PM

## 2016-01-21 ENCOUNTER — Inpatient Hospital Stay (HOSPITAL_COMMUNITY): Payer: Medicare Other

## 2016-01-21 LAB — BASIC METABOLIC PANEL
ANION GAP: 11 (ref 5–15)
BUN: 29 mg/dL — ABNORMAL HIGH (ref 6–20)
CALCIUM: 8.3 mg/dL — AB (ref 8.9–10.3)
CO2: 24 mmol/L (ref 22–32)
Chloride: 118 mmol/L — ABNORMAL HIGH (ref 101–111)
Creatinine, Ser: 1.17 mg/dL (ref 0.61–1.24)
GLUCOSE: 169 mg/dL — AB (ref 65–99)
Potassium: 2.9 mmol/L — ABNORMAL LOW (ref 3.5–5.1)
SODIUM: 153 mmol/L — AB (ref 135–145)

## 2016-01-21 LAB — SODIUM
Sodium: 153 mmol/L — ABNORMAL HIGH (ref 135–145)
Sodium: 154 mmol/L — ABNORMAL HIGH (ref 135–145)

## 2016-01-21 LAB — GLUCOSE, CAPILLARY
GLUCOSE-CAPILLARY: 178 mg/dL — AB (ref 65–99)
GLUCOSE-CAPILLARY: 151 mg/dL — AB (ref 65–99)
GLUCOSE-CAPILLARY: 158 mg/dL — AB (ref 65–99)
GLUCOSE-CAPILLARY: 136 mg/dL — AB (ref 65–99)
GLUCOSE-CAPILLARY: 158 mg/dL — AB (ref 65–99)
Glucose-Capillary: 202 mg/dL — ABNORMAL HIGH (ref 65–99)

## 2016-01-21 LAB — PHOSPHORUS: Phosphorus: 3.2 mg/dL (ref 2.5–4.6)

## 2016-01-21 LAB — MAGNESIUM: MAGNESIUM: 1.9 mg/dL (ref 1.7–2.4)

## 2016-01-21 MED ORDER — MAGNESIUM SULFATE 2 GM/50ML IV SOLN
2.0000 g | Freq: Once | INTRAVENOUS | Status: AC
  Administered 2016-01-21: 2 g via INTRAVENOUS
  Filled 2016-01-21: qty 50

## 2016-01-21 MED ORDER — INSULIN ASPART 100 UNIT/ML ~~LOC~~ SOLN
2.0000 [IU] | SUBCUTANEOUS | Status: DC
  Administered 2016-01-21: 4 [IU] via SUBCUTANEOUS
  Administered 2016-01-21: 2 [IU] via SUBCUTANEOUS
  Administered 2016-01-21 – 2016-01-22 (×3): 4 [IU] via SUBCUTANEOUS
  Administered 2016-01-22: 2 [IU] via SUBCUTANEOUS
  Administered 2016-01-23 – 2016-01-26 (×4): 4 [IU] via SUBCUTANEOUS

## 2016-01-21 MED ORDER — POTASSIUM CHLORIDE 20 MEQ/15ML (10%) PO SOLN
40.0000 meq | Freq: Once | ORAL | Status: AC
  Administered 2016-01-21: 40 meq via ORAL
  Filled 2016-01-21: qty 30

## 2016-01-21 MED ORDER — ANTISEPTIC ORAL RINSE SOLUTION (CORINZ)
7.0000 mL | Freq: Four times a day (QID) | OROMUCOSAL | Status: DC
Start: 2016-01-21 — End: 2016-01-21

## 2016-01-21 MED ORDER — JEVITY 1.2 CAL PO LIQD
1000.0000 mL | ORAL | Status: DC
  Administered 2016-01-21: 1000 mL
  Filled 2016-01-21 (×3): qty 1000

## 2016-01-21 MED ORDER — CHLORHEXIDINE GLUCONATE 0.12% ORAL RINSE (MEDLINE KIT): 15.0000 mL | Freq: Two times a day (BID) | OROMUCOSAL | Status: DC

## 2016-01-21 MED ORDER — ANTISEPTIC ORAL RINSE SOLUTION (CORINZ)
7.0000 mL | OROMUCOSAL | Status: DC
  Administered 2016-01-21 – 2016-01-23 (×22): 7 mL via OROMUCOSAL

## 2016-01-21 MED ORDER — POTASSIUM CHLORIDE 20 MEQ/15ML (10%) PO SOLN
40.0000 meq | Freq: Once | ORAL | Status: AC
  Administered 2016-01-21: 40 meq
  Filled 2016-01-21: qty 30

## 2016-01-21 MED ORDER — CHLORHEXIDINE GLUCONATE 0.12% ORAL RINSE (MEDLINE KIT)
15.0000 mL | Freq: Two times a day (BID) | OROMUCOSAL | Status: DC
  Administered 2016-01-21 – 2016-01-29 (×17): 15 mL via OROMUCOSAL
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
## 2016-01-22 ENCOUNTER — Inpatient Hospital Stay (HOSPITAL_COMMUNITY): Payer: Medicare Other

## 2016-01-22 DIAGNOSIS — G911 Obstructive hydrocephalus: Secondary | ICD-10-CM

## 2016-01-22 DIAGNOSIS — J690 Pneumonitis due to inhalation of food and vomit: Secondary | ICD-10-CM

## 2016-01-22 LAB — GLUCOSE, CAPILLARY
GLUCOSE-CAPILLARY: 160 mg/dL — AB (ref 65–99)
GLUCOSE-CAPILLARY: 94 mg/dL (ref 65–99)
GLUCOSE-CAPILLARY: 116 mg/dL — AB (ref 65–99)
GLUCOSE-CAPILLARY: 158 mg/dL — AB (ref 65–99)
Glucose-Capillary: 124 mg/dL — ABNORMAL HIGH (ref 65–99)
Glucose-Capillary: 109 mg/dL — ABNORMAL HIGH (ref 65–99)

## 2016-01-22 LAB — BASIC METABOLIC PANEL
ANION GAP: 6 (ref 5–15)
BUN: 29 mg/dL — AB (ref 6–20)
CHLORIDE: 123 mmol/L — AB (ref 101–111)
CO2: 24 mmol/L (ref 22–32)
Calcium: 8.3 mg/dL — ABNORMAL LOW (ref 8.9–10.3)
Creatinine, Ser: 0.88 mg/dL (ref 0.61–1.24)
GFR calc Af Amer: >60 mL/min (ref >60)
Glucose, Bld: 120 mg/dL — ABNORMAL HIGH (ref 65–99)
POTASSIUM: 3 mmol/L — AB (ref 3.5–5.1)
SODIUM: 153 mmol/L — AB (ref 135–145)

## 2016-01-22 LAB — BLOOD GAS, ARTERIAL
ACID-BASE DEFICIT: 0.4 mmol/L (ref 0.0–2.0)
ACID-BASE EXCESS: 3 mmol/L — AB (ref 0.0–2.0)
BICARBONATE: 26.1 meq/L — AB (ref 20.0–24.0)
BICARBONATE: 21.8 meq/L (ref 20.0–24.0)
DRAWN BY: 270221
Drawn by: 44166
FIO2: 40
LHR: 24 {breaths}/min
O2 CONTENT: 3 L/min
O2 SAT: 98.8 %
O2 Saturation: 96.6 %
PATIENT TEMPERATURE: 97.8
PCO2 ART: 35.9 mmHg (ref 35.0–45.0)
PCO2 ART: 24.4 mmHg — AB (ref 35.0–45.0)
PEEP/CPAP: 5 cmH2O
PH ART: 7.48 — AB (ref 7.350–7.450)
PH ART: 7.558 — AB (ref 7.350–7.450)
PO2 ART: 94.6 mmHg (ref 80.0–100.0)
PO2 ART: 123 mmHg — AB (ref 80.0–100.0)
Patient temperature: 100.8
TCO2: 27.1 mmol/L (ref 0–100)
TCO2: 22.6 mmol/L (ref 0–100)
VT: 570 mL

## 2016-01-22 LAB — CBC
HEMATOCRIT: 35.3 % — AB (ref 39.0–52.0)
HEMOGLOBIN: 10.6 g/dL — AB (ref 13.0–17.0)
MCH: 29.3 pg (ref 26.0–34.0)
MCHC: 30 g/dL (ref 30.0–36.0)
MCV: 97.5 fL (ref 78.0–100.0)
Platelets: 150 10*3/uL (ref 150–400)
RBC: 3.62 MIL/uL — ABNORMAL LOW (ref 4.22–5.81)
RDW: 16.9 % — AB (ref 11.5–15.5)
WBC: 20.2 10*3/uL — AB (ref 4.0–10.5)

## 2016-01-22 MED ORDER — DILTIAZEM 12 MG/ML ORAL SUSPENSION
60.0000 mg | Freq: Four times a day (QID) | ORAL | Status: DC
  Administered 2016-01-22 – 2016-01-25 (×10): 60 mg via ORAL
  Filled 2016-01-22 (×18): qty 6

## 2016-01-22 MED ORDER — BETHANECHOL CHLORIDE 5 MG PO TABS
5.0000 mg | ORAL_TABLET | Freq: Three times a day (TID) | ORAL | Status: DC
  Administered 2016-01-22 – 2016-01-25 (×8): 5 mg via ORAL
  Filled 2016-01-22 (×14): qty 1

## 2016-01-22 MED ORDER — FAMOTIDINE 40 MG/5ML PO SUSR
40.0000 mg | Freq: Two times a day (BID) | ORAL | Status: DC
  Administered 2016-01-22 – 2016-01-25 (×6): 40 mg
  Filled 2016-01-22 (×8): qty 5

## 2016-01-22 MED ORDER — POTASSIUM CHLORIDE 20 MEQ/15ML (10%) PO SOLN
40.0000 meq | ORAL | Status: AC
  Administered 2016-01-22 (×3): 40 meq via ORAL
  Filled 2016-01-22 (×3): qty 30

## 2016-01-22 MED ORDER — VITAL AF 1.2 CAL PO LIQD
1000.0000 mL | ORAL | Status: DC
  Administered 2016-01-22: 1000 mL
  Filled 2016-01-22 (×3): qty 1000
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
## 2016-01-22 NOTE — Progress Notes (Signed)
STROKE TEAM PROGRESS NOTE   SUBJECTIVE (INTERVAL HISTORY) Neuro decline with respiratory failure over weekend, intubated. Quality of life, recovery poor. CCM and neurology discuss this a.m. Both agreeable to DO NOT RESUSCITATE and palative of extubation. However, unclear if trach and PEG with eventual SNF placement is the right decision. Agreeable to ethics consult.   OBJECTIVE Temp:  [97.2 F (36.2 C)-97.9 F (36.6 C)] 97.2 F (36.2 C) (08/14 0400) Pulse Rate:  [36-106] 83 (08/14 0738) Cardiac Rhythm: Atrial fibrillation (08/13 2200) Resp:  [19-26] 24 (08/14 0738) BP: (96-166)/(48-140) 140/64 (08/14 0738) SpO2:  [99 %-100 %] 100 % (08/14 0738) FiO2 (%):  [40 %] 40 % (08/14 0738) Weight:  [46.6 kg (102 lb 11.8 oz)] 46.6 kg (102 lb 11.8 oz) (08/14 0207)  CBC:  Recent Labs Lab 01/20/16 0514 01/20/16 1458 01/22/16 0650  WBC 12.6* 17.5* 20.2*  NEUTROABS 10.5*  --   --   HGB 12.0* 12.5* 10.6*  HCT 39.9 41.6 35.3*  MCV 97.6 96.5 97.5  PLT 190 202 [id number]    Basic Metabolic Panel:  Recent Labs Lab 01/21/16 0104  01/21/16 1030 01/22/16 0650  NA 153*  < > 153* 153*  K  --   --  2.9* 3.0*  CL  --   --  118* 123*  CO2  --   --  24 24  GLUCOSE  --   --  169* 120*  BUN  --   --  29* 29*  CREATININE  --   --  1.17 0.88  CALCIUM  --   --  8.3* 8.3*  MG 1.9  --   --   --   PHOS 3.2  --   --   --   < > = values in this interval not displayed.  Lipid Panel:     Component Value Date/Time   CHOL 104 01/15/2016 0405   TRIG 46 01/15/2016 0405   HDL 31 (L) 01/15/2016 0405   CHOLHDL 3.4 01/15/2016 0405   VLDL 9 01/15/2016 0405   LDLCALC 64 01/15/2016 0405   HgbA1c:  Lab Results  Component Value Date   HGBA1C 6.7 (H) 01/15/2016    IMAGING I have personally reviewed the radiological images below and agree with the radiology interpretations.  Ct Head Wo Contrast 01/22/2016 IMPRESSION: Little change in appearance from the most recent study in the large subacute infarct  involving the right ACA and MCA distributions. The degree of midline shift is unchanged, although there is slightly more dilatation of the left lateral ventricle.  01/20/2016 Right ACA and MCA infarct similar distribution to the prior study. There is no acute hemorrhage. There is increased edema and mass-effect with increased midline shift. Progressive obstruction of the left lateral ventricle.   01/17/2016 1. Large evolving subacute right MCA territory infarct, with associated smaller right ACA territory infarct. No evidence for hemorrhagic transformation. 2. Worsened localized edema with increased 11 mm of right-to-left shift. No hydrocephalus. Slightly worsened asymmetric dilatation of the left lateral ventricle concerning for possible developing ventricular trapping.   01/14/2016 Hyperdense right MCA indicative of thrombus, with evidence of subacute ischemia in the right MCA distribution,   Ct Angio Head and neck W Or Wo Contrast 01/15/2016 1. 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 Nonhemorrhagic acute infarct throughout the right MCA territory and in the distal right ACA territory. 2. Remote left parietal occipital infarct. 3. Extensive debris in the central airways. Correlate for symptoms of aspiration.   Ct  Cervical Spine Wo Contrast 01/14/2016  1. Hyperdense right MCA indicative of thrombus, with evidence of subacute ischemia in the right MCA distribution, as above. 2. No evidence of significant acute traumatic injury to the facial bones or cervical spine. Critical Value/emergent results were called by telephone at the time of interpretation on 01/14/2016 at 4:34 pm to Dr. Shon Hale, who verbally acknowledged these results. Electronically Signed   By: Vinnie Langton M.D.   On: 01/14/2016 16:54   Ct Chest W Contrast Ct Abdomen Pelvis W Contrast 01/14/2016 1. Filling defect in the apex of the left ventricle highly concerning for thrombus. This may suggest an embolic source as  a cause of the patient's right MCA occlusion. This was discussed by telephone at the time of interpretation on 01/14/2016 at 5:02 pm to Dr. Shon Hale, who verbally acknowledged these results. 2. No evidence of significant acute traumatic injury to the chest, abdomen or pelvis. 3. The appearance of the lungs suggests mild congestive heart failure, with mild interstitial pulmonary edema and trace bilateral pleural effusions. 4. 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 Whether this is chronic (favored), or sequela of large embolic event is uncertain. 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 Celiac axis and SMA are both patent. 5. Area in the spleen which is likely related to prior splenic infarct, suggesting prior embolic event. 6. Cardiomegaly with left ventricular and left atrial dilatation. 7. Aortic atherosclerosis, in addition to 3 vessel coronary artery disease. Please note that although the presence of coronary artery calcium documents the presence of coronary artery disease, the severity of this disease and any potential stenosis cannot be assessed on this non-gated CT examination. Assessment for potential risk factor modification, dietary therapy or pharmacologic therapy may be warranted, if clinically indicated. 8. Additional incidental findings, as above.   Dg Chest Port 1 View 01/22/2016 1.  Stable lines and tubes. 2. Pulmonary hyperinflation.  No acute cardiopulmonary abnormality.  01/21/2016 No acute abnormalities.  01/20/2016 Endotracheal tube placed Right basilar pneumonia has improved.  01/20/2016 Increased right mid lower lung airspace opacities which may represent aspiration or infection. Enteric tube courses inferior to the diaphragm. Left IJ central venous catheter tip projects over the superior vena cava.  01/15/2016 Left IJ central line in place.  No pneumothorax.   2-D echocardiogram - Left ventricle: The  cavity size was mildly dilated. Wall   thickness was normal. Systolic function was severely reduced. The   estimated ejection fraction was 15%. Diffuse hypokinesis. - Aortic valve: There was mild regurgitation. - Mitral valve: Calcified annulus. There was moderate   regurgitation. - Left atrium: The atrium was severely dilated. - Right atrium: The atrium was mildly dilated. Impressions: - Severe global reduction in LV function; mild AI; moderate MR;   biatrial enlargement; mild TR.  EEG -  1. Mild diffuse slowing of the waking background. 2. Focal slowing over the left hemisphere. There were no epileptiform discharges or electrographic seizures seen.  PHYSICAL EXAM  Temp:  [97.2 F (36.2 C)-99.1 F (37.3 C)] 97.5 F (36.4 C) (08/14 1530) Pulse Rate:  [35-105] 105 (08/14 1517) Resp:  [14-29] 14 (08/14 1517) BP: (95-166)/(51-140) 136/84 (08/14 1530) SpO2:  [98 %-100 %] 98 % (08/14 1517) FiO2 (%):  [30 %-40 %] 30 % (08/14 1517) Weight:  [102 lb 11.8 oz (46.6 kg)] 102 lb 11.8 oz (46.6 kg) (08/14 0207)  General - malnourished with lack of muscle mass, well developed, intubated  on fentanyl bolus PRN.  Ophthalmologic - Fundi not visualized due to noncooperation.  Cardiovascular - irregularly irregular heart rate and rhythm.  Neuro - intubated on fentanyl bolus PRN. Open eyes on voice and pain, but not following commands. PERRL, R gaze preference but able to cross midline. Positive corneal and gag. Against forced eye opening. Right upper extremity and lower extremity spontaneous movement, purposeful. Left upper extremity 0/5, left lower extremity proximal 3-/5 and distal 0/5. Muscle tone decreased on the left, DTR 1+, Babinski negative. Sensation, coordination, and gait not tested.   ASSESSMENT/PLAN Mr. Bankston Muzyka is a 70 y.o. male with history of AFib not on anticoagulation, S/P pacer, non-hodgkins lymphoma, GIB with tubular adenoma, HLD, and HTN presenting after a fall off a park  bench with L sided weakness. He did not receive IV t-PA or IR due to high ASPECTS score.   Stroke:  Right MCA infarct in setting of hyperdense R MCA, felt to be embolic secondary to known atrial fibrillation not on anticoagulation vs cardiomyopathy (EF 15%)  Resultant  left hemiplegia, VDRF  CTA head and neck distal right ICA thrombus extending into right M1 and A1 segments. right MCA and right ACA territory infarct. Old left parietal occipital infarct.  MRI  / MRA  Pacemaker  CT repeat x 3 increasing edema and left lateral ventricle hydrocephalus  2D Echo  EF 15%. No source of embolus. Diffuse hypokinesis.  EEG no definite seizure activity, focal slowing on the left. Mildly diffuse slow.  LDL 64  HgbA1c 6.7  SCDs for VTE prophylaxis Diet NPO time specified  aspirin 81 mg daily prior to admission, now on aspirin 325 mg daily.   Ongoing aggressive stroke risk factor management  Therapy recommendations:  SNF  Disposition:  pending (homeless. recently discharged from jail, no family, reportedly has a girlfriend)  Deposition   Neuro and respiratory worsening 01/20/2016 - decreased level of reponsivness, tachycardia, tachypnea, fever, and chest congestion.   Repeat CT increasing edema, midline shift, and left lateral ventricle hydrocephalus  Poor neurologic prognosis, given large right MCA stroke, hydrocephalus, cerebral edema, PTA lymphoma, lack of social support  Agreeable to DO NOT RESUSCITATE with pallative wean  ETHIC consult to discuss plan of care as there is not family/friends available  VDRF, Right lower lobe aspiration pneumonia resolved  Intubated 01/20/2016 for respiratory distress that occurred following ativan dosing  CCM on board  On vancomycin and Zosyn   Repeat CXR resolved RLL pneumonia  Cerebral edema, induced hypernatremia  Treated with 3% saline, now off  Na 153  Will let Na drift down  Obstructive hydrocephalus  Increasing left lateral  ventricle hydrocephalus  Not a good candidate for EVD placement  Continue observation  Cardiomyopathy  EF 15%  Pacer in place  Not good candidate for anticoagulation due to social condition and noncompliance  On aspirin 325  Dysphagia  Secondary to stroke  CorTrack placed and started on tube feedings  Trauma called 01/19/2016 for PEG placement  Tentative plans for placement on Wednesday, 01/24/2016  Due to worsening neuro and respiratory status, will hold off PEG now.  Urinary Retention  I&O with 46ml  Will rescan today  Has condom catheter for now  Check UA - pending  Plan urecholine  Possible Foley   Chronic Atrial Fibrillation  Home anticoagulation:  none   Not a good anticoagulation candidate so far secondary to social situation and compliance  On aspirin   Ongoing treatment with Cardizem   Leukocytosis  WBC 17.5->  20.2  On vanco and Zosyn  CCM following  Hypertension  Stable  Long-term BP goal normotensive  Hyperlipidemia  Hx of HLD but not on home meds  LDL 65, at goal < 70  Tobacco abuse  Current smoker  Smoking cessation counseling provided  Other Stroke Risk Factors  Advanced age  ETOH use, advised to drink no more than 2 drink(s) a day  Marijuana use, UDS not done this admission   Other Active Problems  History non-Hodgkin's lymphoma  History of GI bleeding  History tubular adenoma  History of complete heart block s/p pacer  Lower extremities leg wounds - patient states from insect bite - local pruritis, developing wounds. WOC consulted to evaluate, recommend treatment -> Cleanse left hip wound with normal saline, pat dry. Cover wound bed with Xeroform gauze and secure with foam dressing. Change every other day. (2) Apply protective foam dressing to left malleolus and left knee. Change prn  Hypokalemia 3.0 - supplement    Hospital day # 8  This patient is critically ill due to large right MCA infarct,  hydrocephalus, respiratory failure, cerebral edema and at significant risk of neurological worsening, death form brain herniation, right transformation, sepsis, aspiration pneumonia, seizure. This patient's care requires constant monitoring of vital signs, hemodynamics, respiratory and cardiac monitoring, review of multiple databases, neurological assessment, discussion with family, other specialists and medical decision making of high complexity. I spent 45 minutes of neurocritical care time in the care of this patient.  Rosalin Hawking, MD PhD Stroke Neurology 01/22/2016 6:09 PM     To contact Stroke Continuity provider, please refer to [email or web address] After hours, contact General Neurology

## 2016-01-22 NOTE — Ethics Note (Signed)
Met with care team to discuss process of decision making in this unfortunate man.  Background: 70 yo male with significant co-morbid problems of cardiomyopathy (EF=15%), severe malnutrition and very difficult social situation presented on 8/9 with a large right MCA CVA.  Decompensated on 8/12 and required intubation.  Pertinent issues now are: 1. Respiratory status has improved and likely will survive extubation. 2. Clearly lacks capacity at this point.  It is unclear whether he will regain capacity. 3. He has no available family members to help with end of life decision making.    Recommendations: 1. The health care providers are the appropriate decision makers at this juncture. 2. The situation is evolving.  He may regain capacity or not.  He may or may not be able to protect his airway.  He may or may not be able to regain the ability to take in nourishment naturally. 3. Given the evolving situation, it is important to make one clinical decision at a time. 4. The team has thoroughly considered the risks and benefits.  Because they are unanimous and have been through a careful process, the ethics committee supports their decision for DNR, extubation, and no intention to reintubate if respiratory status declines. 5. Once extubated and off sedation, the next likely decision will be around artificial nutrition.  Agree with the team's plan to reassess after extubation.   6. The team should review the patient's accumlated medical record (prior hospitalizations) to see if there is an indication of the patient's attitude toward aggressive versus comfort focused medical treatments.   The ethics committee is available to reconsult if the team deems helpful.

## 2016-01-22 NOTE — Progress Notes (Signed)
SLP Cancellation Note  Patient Details Name: [name] MRN: [id number] DOB: 1946-02-03   Cancelled treatment:        Pt intubated. Worsened localized edema with increased 11 mm of right-to-left shift  per CT. Will follow for short term.    [name] 01/22/2016, 9:11 AM

## 2016-01-22 NOTE — Progress Notes (Signed)
Patient transported to CT and back to room [id number] without complications.

## 2016-01-22 NOTE — Progress Notes (Signed)
Patient ID: [name], male   DOB: 02/17/1946, 70 y.o.   MRN: [id number] Due to endoscopy staffing, unable to schedule PEG until 8/16 PM with Dr. Hulen Skains. I D/W Dr. Erlinda Hong. Noted worsening neurologic status. We will continue to follow. Georganna Skeans, MD, MPH, FACS Trauma: [phone] General Surgery: [phone]

## 2016-01-22 NOTE — Progress Notes (Signed)
PT Cancellation Note/Discharge.    Patient Details Name: Koleton Arcia MRN: [id number] DOB: August 21, 1945   Cancelled Treatment:    Reason Eval/Treat Not Completed: Medical issues which prohibited therapy.  Significant medical decline since PT last saw pt.  We will sign off for now.  Please re-order when/if appropriate.    Thanks,    Barbarann Ehlers. [place], [place], DPT [phone]   01/22/2016, 9:31 AM

## 2016-01-22 NOTE — Progress Notes (Signed)
OT Sign off Note  Patient Details Name: [name] MRN: [id number] DOB: 02-19-1946   Cancelled Treatment:    Reason Eval/Treat Not Completed: Patient not medically ready;Patient declined, no reason specified  Please reorder OT if apporpriate.  Parke Poisson B 01/22/2016, 10:05 AM  [phone]

## 2016-01-22 NOTE — Progress Notes (Signed)
Respiratory rate decreased to 14, per MD order, on ventilator.

## 2016-01-22 NOTE — Progress Notes (Signed)
Note pt. Is back on vent this am.    Gerlean Ren PT Inpatient Rehab Admissions Coordinator Cell [phone] Office [phone]

## 2016-01-22 NOTE — Care Management Important Message (Signed)
Important Message  Patient Details  Name: [name] MRN: [id number] Date of Birth: [birth year]   Medicare Important Message Given:  Yes    Nathen May 01/22/2016, 10:47 AM

## 2016-01-22 NOTE — Progress Notes (Signed)
Sputum sample obtained and sent down to main lab without complications.  

## 2016-01-22 NOTE — Progress Notes (Signed)
Nutrition Follow-up  DOCUMENTATION CODES:   Severe malnutrition in context of social or environmental circumstances, Underweight  INTERVENTION:   D/C Jevity 1.2 Vital AF 1.2 @ 50 ml/hr via Cortrak Provides: 1200 ml, 1440 kcal, 90 grams protein, and 973 ml H2O.    NUTRITION DIAGNOSIS:   Malnutrition (Severe) related to social / environmental circumstances as evidenced by severe depletion of body fat, severe depletion of muscle mass. Ongoing.   GOAL:   Patient will meet greater than or equal to 90% of their needs Met.   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 Pt found to have acute ischemic R MCA stroke.   Pt having diarrhea, per RN watery. No fever. Rectal tube inserted today.  8/11 cortrak, tip in duodenum 8/12 intubated Ethics consult pending for plan of care with pt who has no family. Per MD would need trach/PEG.   Patient is currently intubated on ventilator support MV: 7.3 L/min Temp (24hrs), Avg:97.9 F (36.6 C), Min:97.2 F (36.2 C), Max:99.1 F (37.3 C)  Medications reviewed and include: KCl Labs reviewed: Na 153, K+ 3.0 CBG's: 109-160 Pt discussed during ICU rounds and with RN.    Diet Order:  Diet NPO time specified  Skin:  Wound (see comment) (stage II hip, lacerations)  Last BM:  8/14, rectal tube inserted  Height:   Ht Readings from Last 1 Encounters:  01/20/16 '5\' 9"'  (1.753 m)    Weight:   Wt Readings from Last 1 Encounters:  01/22/16 102 lb 11.8 oz (46.6 kg)    Ideal Body Weight:  72.7 kg  BMI:  Body mass index is 15.17 kg/m.  Estimated Nutritional Needs:   Kcal:  1425  Protein:  75-90 grams  Fluid:  1.7  L  EDUCATION NEEDS:   No education needs identified at this time  [place], [place], [place] Pager [phone] After Hours Pager

## 2016-01-22 NOTE — Progress Notes (Signed)
Met w/ Dr Xu and the ethics committee representatives. It is our opinion from a critical care stand-point that on-going mechanical ventilation and/or trach placement will not result in improved quality if life and further more would be medically ineffective care.  Plan Full DNR Extubate in am 8/15 If he does well then we will support him w/ tubefeeds.  If he does poorly we can address transition to comfort if needed.    E  ACNP-BC [place] [facility] Pager # [phone] OR # [phone] if no answer  

## 2016-01-23 LAB — BASIC METABOLIC PANEL
Anion gap: 4 — ABNORMAL LOW (ref 5–15)
BUN: 34 mg/dL — AB (ref 6–20)
CHLORIDE: 125 mmol/L — AB (ref 101–111)
CO2: 25 mmol/L (ref 22–32)
CREATININE: 0.89 mg/dL (ref 0.61–1.24)
Calcium: 8.3 mg/dL — ABNORMAL LOW (ref 8.9–10.3)
GFR calc Af Amer: >60 mL/min (ref >60)
GFR calc non Af Amer: >60 mL/min (ref >60)
GLUCOSE: 137 mg/dL — AB (ref 65–99)
Potassium: 4.5 mmol/L (ref 3.5–5.1)
Sodium: 154 mmol/L — ABNORMAL HIGH (ref 135–145)

## 2016-01-23 LAB — GLUCOSE, CAPILLARY
GLUCOSE-CAPILLARY: 75 mg/dL (ref 65–99)
GLUCOSE-CAPILLARY: 77 mg/dL (ref 65–99)
GLUCOSE-CAPILLARY: 93 mg/dL (ref 65–99)
Glucose-Capillary: 99 mg/dL (ref 65–99)
Glucose-Capillary: 176 mg/dL — ABNORMAL HIGH (ref 65–99)
Glucose-Capillary: 78 mg/dL (ref 65–99)

## 2016-01-23 MED ORDER — GLYCOPYRROLATE 0.2 MG/ML IJ SOLN
0.2000 mg | INTRAMUSCULAR | Status: DC | PRN
  Administered 2016-01-23: 0.2 mg via INTRAVENOUS
  Filled 2016-01-23 (×3): qty 1

## 2016-01-23 MED ORDER — FENTANYL CITRATE (PF) 100 MCG/2ML IJ SOLN
12.5000 ug | INTRAMUSCULAR | Status: DC | PRN
  Administered 2016-01-23 – 2016-01-29 (×10): 12.5 ug via INTRAVENOUS
  Filled 2016-01-23 (×10): qty 2

## 2016-01-23 MED ORDER — IPRATROPIUM-ALBUTEROL 0.5-2.5 (3) MG/3ML IN SOLN: 3.0000 mL | Freq: Four times a day (QID) | RESPIRATORY_TRACT | Status: DC | PRN

## 2016-01-23 MED ORDER — CHLORHEXIDINE GLUCONATE 0.12 % MT SOLN
OROMUCOSAL | Status: AC
  Filled 2016-01-23: qty 15

## 2016-01-23 NOTE — Progress Notes (Signed)
RT called to patient room due to patient self extubating.  Patient was supposed to have one way extubation today.  Vitals are stable.  Sats currently 100% on 2L nasal cannula.  No complications noted.  MD aware.

## 2016-01-23 NOTE — Progress Notes (Signed)
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 8/6 he had a scuffle with [employer] and "fell off a bench". Facial abrasions noted. He came to [facility] ER for weakness on left side and CT scan ICA thrombus and he was admitted. Early am 8/12 noted to have rr 40, cxr with rt pna cw aspiration and inability to protect airway. He now has tube feeds via feeding tube left nare. He will be moved to ICU and will most likely require intubation.   SUBJECTIVE:  Intubated but follows commands  VITAL SIGNS: BP 133/76   Pulse 74   Temp 97.7 F (36.5 C) (Axillary)   Resp 13   Ht _0  (1.753 m)   Wt 101 lb 10.1 oz (46.1 kg)   SpO2 97%   BMI 15.01 kg/m   HEMODYNAMICS:    VENTILATOR SETTINGS: Vent Mode: PSV;CPAP FiO2 (%):  [30 %-40 %] 30 % Set Rate:  [14 bmp] 14 bmp Vt Set:  [570 mL] 570 mL PEEP:  [5 cmH20] 5 cmH20 Pressure Support:  [5 cmH20] 5 cmH20 Plateau Pressure:  [13 cmH20-17 cmH20] 14 cmH20  INTAKE / OUTPUT: I/O last 3 completed shifts: In: 2759.2 [I.V.:912.5; NG/GT:1346.7; IV Piggyback:500] Out: 4098 [Urine:1228; Stool:200]  PHYSICAL EXAMINATION: General:  Frail/wasted WM who appears older than stated age; follows commands Neuro:  follows commands, on vent not sedated , wds to stimulus, moves right side to command HEENT: No JVD; orally intubated Cardiovascular:  HSIR iR Lungs: Decreased air movement .  Abdomen: + bs Musculoskeletal:  Inatact Skin: warm/dry rt facial abrasions   LABS:  BMET  Recent Labs Lab 01/21/16 1030 01/22/16 0650 01/23/16 0538  NA 153* 153* 154*  K 2.9* 3.0* 4.5  CL 118* 123* 125*  CO2 _1 BUN 29* 29* 34*  CREATININE 1.17 0.88 0.89  GLUCOSE 169* 120* 137*     Electrolytes  Recent Labs Lab 01/21/16 0104 01/21/16 1030 01/22/16 0650 01/23/16 0538  CALCIUM  --  8.3* 8.3* 8.3*  MG 1.9  --   --   --   PHOS 3.2  --   --   --     CBC  Recent Labs Lab 01/20/16 0514 01/20/16 1458 01/22/16 0650  WBC 12.6* 17.5* 20.2*  HGB 12.0* 12.5* 10.6*  HCT 39.9 41.6 35.3*  PLT 190 202 150    Coag's No results for input(s): APTT, INR in the last 168 hours.  Sepsis Markers No results for input(s): LATICACIDVEN, PROCALCITON, O2SATVEN in the last 168 hours.  ABG  Recent Labs Lab 01/20/16 0838 01/22/16 0410  PHART 7.480* 7.558*  PCO2ART 35.9 24.4*  PO2ART 94.6 123*    Liver Enzymes  Recent Labs Lab 01/20/16 0514 01/20/16 1458  AST 25 23  ALT 26 24  ALKPHOS 73 72  BILITOT 0.7 1.0  ALBUMIN 2.8* 2.8*    Cardiac Enzymes No results for input(s): TROPONINI, PROBNP in the last 168 hours.  Glucose  Recent Labs Lab 01/22/16 1210 01/22/16 1533 01/22/16 1950 01/22/16 2315 01/23/16 0408 01/23/16 0821  GLUCAP 160* 94 116* 158* 99 176*    Imaging Ct Head Wo Contrast  Result Date: 01/22/2016 CLINICAL DATA:  Recent right MCA stroke.  Altered mental status.  EXAM: CT HEAD WITHOUT CONTRAST TECHNIQUE: Contiguous axial images were obtained from the base of the skull through the vertex without intravenous contrast. COMPARISON:  CT 01/17/2016 and 01/20/2016. FINDINGS: Brain: Again demonstrated is a large nonhemorrhagic infarct involving the right middle and anterior cerebral artery distributions. The underlying involvement of the right basal ganglia appears unchanged. There is no evidence of acute hemorrhage. The resulting right-to-left midline shift is unchanged from the most recent study, approximately 13 mm. There is mildly progressive dilatation of the left lateral ventricle. Old left posterior MCA infarct appears unchanged. Vascular: No new vascular findings are apparent. Skull: Negative for fracture or focal lesion. Sinuses/Orbits: The  visualized paranasal sinuses and mastoid air cells are clear. Other: None. IMPRESSION: Little change in appearance from the most recent study in the large subacute infarct involving the right ACA and MCA distributions. The degree of midline shift is unchanged, although there is slightly more dilatation of the left lateral ventricle. Electronically Signed   By: Richardean Sale M.D.   On: 01/22/2016 14:36   Dg Abd Portable 1v  Result Date: 01/22/2016 CLINICAL DATA:  Readjustment of the feeding tube EXAM: PORTABLE ABDOMEN - 1 VIEW COMPARISON:  01/19/2016 FINDINGS: There is NG feeding tube with tip in proximal duodenum region. Postcholecystectomy surgical clips are noted. IMPRESSION: NG feeding tube with tip in the region of proximal duodenum. Electronically Signed   By: Lahoma Crocker M.D.   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 8/6 he had a scuffle with [employer] and "fell off a bench". Facial abrasions noted. He came to [facility] ER for weakness on left side and CT scan ICA thrombus and he was admitted. Early am 8/12 noted to have rr 40, cxr with rt pna cw aspiration and inability to protect airway->intubated. F/u CT head showing worsening cerebral edema and mass effect. Now w/ massive stroke, left hemiparesis and remains essentially unresponsive. Given his co-morbids he is a poor candidate for elective interventions such as trach/PEG. 8/14 made DNR ASSESSMENT / PLAN: NEUROLOGIC A:   Stroke, distal ICA thrombus, increased edema/mass effect on CT 8/12.  P:   RASS goal: 0 Sedate as needed for tube tolerance Neuro  primary   PULMONARY A: Inability to protect airway due to neurological injury that is expanding from Rt ICA thrombus Aspiration, now on tube feeds Iatrogenic resp alk Aspiration pna rll--> radiographically essentially resolved.  P:   Vent Bundle ->SBT daily  Ve adjusted  NPO except tube feeds Abx for HCAP 8/15 DNR will evaluate for one way extubation.  CARDIOVASCULAR A:  CHB EF15% CAF P:  Diuresis as tolerated  Rate control with dilt po  RENAL  Recent Labs Lab 01/21/16 1030 01/22/16 0650 01/23/16 0538  K 2.9* 3.0* 4.5     Recent Labs Lab 01/21/16 1030 01/22/16 0650 01/23/16 0538  NA 153* 153* 154*    A:   Hypokalemia Hypernatremia (induced) ->spoke w/ stroke team. No longer inducing hypernatremia but they request not treating it at this point given cerebral edema  P:   Replete lytes as needed GASTROINTESTINAL A:   GI protection Enteral feeds P:  H2 blocker Tube feeds  HEMATOLOGIC A:   HX NHL Anemia P:  Follow H/H LMWH  INFECTIOUS A:   Presumed aspiration pna P:   #3/x V/Z Pan culture  ENDOCRINE A:   No acute issue  P:   Trend glucose    FAMILY  - Updates: No family  - Audiological scientist met 8/14. He is now a DNR. Plan to extubate 8/15. If he does well then further treatment options will be explored.    Richardson Landry Jakeem Grape ACNP [facility] PCCM Pager [phone] till 3 pm If no answer page [phone] 01/23/2016, 9:02 AM

## 2016-01-23 NOTE — Progress Notes (Addendum)
STROKE TEAM PROGRESS NOTE   SUBJECTIVE (INTERVAL HISTORY) Pt no acute event overnight. Still intubated, sodium 154. CBC pending.    OBJECTIVE Temp:  [97.5 F (36.4 C)-97.7 F (36.5 C)] 97.6 F (36.4 C) (08/15 1200) Pulse Rate:  [29-132] 87 (08/15 1230) Cardiac Rhythm: Sinus tachycardia (08/15 1200) Resp:  [13-24] 17 (08/15 1230) BP: (97-155)/(52-128) 135/93 (08/15 1230) SpO2:  [96 %-100 %] 96 % (08/15 1230) FiO2 (%):  [30 %] 30 % (08/15 0832) Weight:  [101 lb 10.1 oz (46.1 kg)] 101 lb 10.1 oz (46.1 kg) (08/15 0400)  CBC:   Recent Labs Lab 01/20/16 0514 01/20/16 1458 01/22/16 0650  WBC 12.6* 17.5* 20.2*  NEUTROABS 10.5*  --   --   HGB 12.0* 12.5* 10.6*  HCT 39.9 41.6 35.3*  MCV 97.6 96.5 97.5  PLT 190 202 [id number]    Basic Metabolic Panel:  Recent Labs Lab 01/21/16 0104  01/22/16 0650 01/23/16 0538  NA 153*  < > 153* 154*  K  --   < > 3.0* 4.5  CL  --   < > 123* 125*  CO2  --   < > 24 25  GLUCOSE  --   < > 120* 137*  BUN  --   < > 29* 34*  CREATININE  --   < > 0.88 0.89  CALCIUM  --   < > 8.3* 8.3*  MG 1.9  --   --   --   PHOS 3.2  --   --   --   < > = values in this interval not displayed.  Lipid Panel:     Component Value Date/Time   CHOL 104 01/15/2016 0405   TRIG 46 01/15/2016 0405   HDL 31 (L) 01/15/2016 0405   CHOLHDL 3.4 01/15/2016 0405   VLDL 9 01/15/2016 0405   LDLCALC 64 01/15/2016 0405   HgbA1c:  Lab Results  Component Value Date   HGBA1C 6.7 (H) 01/15/2016    IMAGING I have personally reviewed the radiological images below and agree with the radiology interpretations.  Ct Head Wo Contrast 01/22/2016 IMPRESSION: Little change in appearance from the most recent study in the large subacute infarct involving the right ACA and MCA distributions. The degree of midline shift is unchanged, although there is slightly more dilatation of the left lateral ventricle.  01/20/2016 Right ACA and MCA infarct similar distribution to the prior study. There  is no acute hemorrhage. There is increased edema and mass-effect with increased midline shift. Progressive obstruction of the left lateral ventricle.   01/17/2016 1. Large evolving subacute right MCA territory infarct, with associated smaller right ACA territory infarct. No evidence for hemorrhagic transformation. 2. Worsened localized edema with increased 11 mm of right-to-left shift. No hydrocephalus. Slightly worsened asymmetric dilatation of the left lateral ventricle concerning for possible developing ventricular trapping.   01/14/2016 Hyperdense right MCA indicative of thrombus, with evidence of subacute ischemia in the right MCA distribution,   Ct Angio Head and neck W Or Wo Contrast 01/15/2016 1. 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 Nonhemorrhagic acute infarct throughout the right MCA territory and in the distal right ACA territory. 2. Remote left parietal occipital infarct. 3. Extensive debris in the central airways. Correlate for symptoms of aspiration.   Ct Cervical Spine Wo Contrast 01/14/2016  1. Hyperdense right MCA indicative of thrombus, with evidence of subacute ischemia in the right MCA distribution, as above. 2. No evidence of significant acute traumatic injury to  the facial bones or cervical spine. Critical Value/emergent results were called by telephone at the time of interpretation on 01/14/2016 at 4:34 pm to Dr. Shon Hale, who verbally acknowledged these results. Electronically Signed   By: Vinnie Langton M.D.   On: 01/14/2016 16:54   Ct Chest W Contrast Ct Abdomen Pelvis W Contrast 01/14/2016 1. Filling defect in the apex of the left ventricle highly concerning for thrombus. This may suggest an embolic source as a cause of the patient's right MCA occlusion. This was discussed by telephone at the time of interpretation on 01/14/2016 at 5:02 pm to Dr. Shon Hale, who verbally acknowledged these results. 2. No evidence of significant acute traumatic injury to the  chest, abdomen or pelvis. 3. The appearance of the lungs suggests mild congestive heart failure, with mild interstitial pulmonary edema and trace bilateral pleural effusions. 4. 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 Whether this is chronic (favored), or sequela of large embolic event is uncertain. 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 Celiac axis and SMA are both patent. 5. Area in the spleen which is likely related to prior splenic infarct, suggesting prior embolic event. 6. Cardiomegaly with left ventricular and left atrial dilatation. 7. Aortic atherosclerosis, in addition to 3 vessel coronary artery disease. Please note that although the presence of coronary artery calcium documents the presence of coronary artery disease, the severity of this disease and any potential stenosis cannot be assessed on this non-gated CT examination. Assessment for potential risk factor modification, dietary therapy or pharmacologic therapy may be warranted, if clinically indicated. 8. Additional incidental findings, as above.   Dg Chest Port 1 View 01/22/2016 1.  Stable lines and tubes. 2. Pulmonary hyperinflation.  No acute cardiopulmonary abnormality.  01/21/2016 No acute abnormalities.  01/20/2016 Endotracheal tube placed Right basilar pneumonia has improved.  01/20/2016 Increased right mid lower lung airspace opacities which may represent aspiration or infection. Enteric tube courses inferior to the diaphragm. Left IJ central venous catheter tip projects over the superior vena cava.  01/15/2016 Left IJ central line in place.  No pneumothorax.   2-D echocardiogram - Left ventricle: The cavity size was mildly dilated. Wall   thickness was normal. Systolic function was severely reduced. The   estimated ejection fraction was 15%. Diffuse hypokinesis. - Aortic valve: There was mild regurgitation. - Mitral valve: Calcified  annulus. There was moderate   regurgitation. - Left atrium: The atrium was severely dilated. - Right atrium: The atrium was mildly dilated. Impressions: - Severe global reduction in LV function; mild AI; moderate MR;   biatrial enlargement; mild TR.  EEG -  1. Mild diffuse slowing of the waking background. 2. Focal slowing over the left hemisphere. There were no epileptiform discharges or electrographic seizures seen.  PHYSICAL EXAM  Temp:  [97.5 F (36.4 C)-97.7 F (36.5 C)] 97.6 F (36.4 C) (08/15 1200) Pulse Rate:  [29-132] 87 (08/15 1230) Resp:  [13-24] 17 (08/15 1230) BP: (97-155)/(52-128) 135/93 (08/15 1230) SpO2:  [96 %-100 %] 96 % (08/15 1230) FiO2 (%):  [30 %] 30 % (08/15 0832) Weight:  [101 lb 10.1 oz (46.1 kg)] 101 lb 10.1 oz (46.1 kg) (08/15 0400)  General - malnourished with lack of muscle mass, well developed, intubated on fentanyl bolus PRN.  Ophthalmologic - Fundi not visualized due to noncooperation.  Cardiovascular - irregularly irregular heart rate and rhythm.  Neuro - intubated on fentanyl bolus PRN. More awake and alert, following  peripheral commands on the right. PERRL, R gaze preference but able to cross midline. Positive corneal and gag. Against forced eye opening. Right upper extremity and lower extremity spontaneous movement, purposeful, and strong. Left upper extremity 0/5, left lower extremity proximal 3-/5 and distal 0/5. Muscle tone decreased on the left, DTR 1+, Babinski negative. Sensation, coordination, and gait not tested.   ASSESSMENT/PLAN Mr. Yvon Deck is a 70 y.o. male with history of AFib not on anticoagulation, S/P pacer, non-hodgkins lymphoma, GIB with tubular adenoma, HLD, and HTN presenting after a fall off a park bench with L sided weakness. He did not receive IV t-PA or IR due to high ASPECTS score.   Stroke:  Right MCA infarct in setting of hyperdense R MCA, felt to be embolic secondary to known atrial fibrillation not on  anticoagulation vs cardiomyopathy (EF 15%)  Resultant  left hemiplegia, VDRF  CTA head and neck distal right ICA thrombus extending into right M1 and A1 segments. right MCA and right ACA territory infarct. Old left parietal occipital infarct.  MRI  / MRA  Pacemaker  CT repeat x 3 increasing edema and left lateral ventricle hydrocephalus  2D Echo  EF 15%. No source of embolus. Diffuse hypokinesis.  EEG no definite seizure activity, focal slowing on the left. Mildly diffuse slow.  LDL 64  HgbA1c 6.7  SCDs for VTE prophylaxis Diet NPO time specified  aspirin 81 mg daily prior to admission, now on aspirin 325 mg daily.   Ongoing aggressive stroke risk factor management  Therapy recommendations:  SNF  Disposition:  pending (homeless. recently discharged from jail, no family, reportedly has a girlfriend)  Deposition   Neuro and respiratory worsening 01/20/2016 - decreased level of reponsivness, tachycardia, tachypnea, fever, and chest congestion.   Repeat CT increasing edema, midline shift, and left lateral ventricle hydrocephalus  Poor neurologic prognosis, given large right MCA stroke, hydrocephalus, cerebral edema, PTA lymphoma, lack of social support  Agreeable to DO NOT RESUSCITATE with pallative wean  Plan to one way extubation today and monitor  VDRF, Right lower lobe aspiration pneumonia resolved  Intubated 01/20/2016 for respiratory distress that occurred following ativan dosing  CCM on board  On vancomycin and Zosyn   Repeat CXR resolved RLL pneumonia  Cerebral edema, induced hypernatremia  Treated with 3% saline, now off  Na 153->154  Will let Na drift down  Obstructive hydrocephalus  Increasing left lateral ventricle hydrocephalus  Not a good candidate for EVD placement  Continue observation  Cardiomyopathy  EF 15%  Pacer in place  Not good candidate for anticoagulation due to social condition and noncompliance  On aspirin  325  Dysphagia  Secondary to stroke  CorTrack placed and on tube feedings  Consider PEG tube if pt able to tolerate extubation  Chronic Atrial Fibrillation  Home anticoagulation:  none   Not a good anticoagulation candidate so far secondary to social situation and compliance  On aspirin   Ongoing treatment with Cardizem   Leukocytosis  WBC 17.5-> 20.2  On vanco and Zosyn  CCM following  Hypertension  Stable Long-term BP goal normotensive  Hyperlipidemia  Hx of HLD but not on home meds  LDL 65, at goal < 70  Tobacco abuse  Current smoker  Smoking cessation counseling provided  Other Stroke Risk Factors  Advanced age  ETOH use, advised to drink no more than 2 drink(s) a day  Marijuana use, UDS not done this admission   Other Active Problems  History non-Hodgkin's lymphoma  History  of GI bleeding  History tubular adenoma  History of complete heart block s/p pacer  Lower extremities leg wounds - patient states from insect bite - local pruritis, developing wounds. WOC consulted to evaluate, recommend treatment -> Cleanse left hip wound with normal saline, pat dry. Cover wound bed with Xeroform gauze and secure with foam dressing. Change every other day. (2) Apply protective foam dressing to left malleolus and left knee. Change prn  Hypokalemia 3.0 -> 4.5    Hospital day # 9  This patient is critically ill due to large right MCA infarct, hydrocephalus, respiratory failure, cerebral edema and at significant risk of neurological worsening, death form brain herniation, right transformation, sepsis, aspiration pneumonia, seizure. This patient's care requires constant monitoring of vital signs, hemodynamics, respiratory and cardiac monitoring, review of multiple databases, neurological assessment, discussion with family, other specialists and medical decision making of high complexity. I spent 35 minutes of neurocritical care time in the care of this  patient.  Rosalin Hawking, MD PhD Stroke Neurology 01/23/2016 1:02 PM     To contact Stroke Continuity provider, please refer to [email or web address] After hours, contact General Neurology

## 2016-01-23 NOTE — Evaluation (Signed)
Clinical/Bedside Swallow Evaluation Patient Details  Name: [name] MRN: [id number] Date of Birth: [birth year]  Today's Date: 01/23/2016 Time: SLP Start Time (ACUTE ONLY): [id number] SLP Stop Time (ACUTE ONLY): 1447 SLP Time Calculation (min) (ACUTE ONLY): 14 min  Past Medical History:  Past Medical History:  Diagnosis Date  . A-fib ([place])   . Cancer ([place])    non hodgkins  . Complete heart block ([place])   . GIB (gastrointestinal bleeding)   . Hyperlipidemia   . Hypertension   . Tubular adenoma    Past Surgical History:  Past Surgical History:  Procedure Laterality Date  . APPENDECTOMY    . CHOLECYSTECTOMY    . ESOPHAGOGASTRODUODENOSCOPY    . PACEMAKER PLACEMENT    . 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 Pt had scuffle with [employer] and "fell off a bench". Noted weakness on left side and CT ervealed large subacute infarct involving the right ACA and MCA distributions with midline shift. Required intubation 8/12 after having RR 40, CXR with rt pna c/w aspiration and inability to protect airway. Self extubated 8/15. MBS 8/7 revealed silent aspiration with nectar with eventual cough. Dys 1 texture and honey liquids recommended. BSE today for possibility of po meds until objective completed next date.   Assessment / Plan / Recommendation Clinical Impression  Pt aphonic with wet quality during whispered attempts at phonation. Awake with decreased level of alertness and attention. Immediate and delayed coughing with liquids extracting applesauce during oral sucitoning (no oral residue) indicative of pharyngeal residue. Recommend continued NPO (no meds); SLP will return next date for appropriateness for MBS.    Aspiration Risk  Severe aspiration risk    Diet Recommendation NPO        Other  Recommendations Oral Care Recommendations: Oral care QID   Follow up Recommendations  Skilled Nursing facility    Frequency and Duration min 2x/week  2 weeks       Prognosis Prognosis for Safe Diet Advancement: Fair Barriers to Reach Goals: Cognitive deficits;Severity of deficits      Swallow Study   General HPI: 70 yo homeless male with known NHL, CHB with PPM,EF15%, CAF,HTN,GIB, homeless without family support. Pt had scuffle with [employer] and "fell off a bench". Noted weakness on left side and CT ervealed large subacute infarct involving the right ACA and MCA distributions with midline shift. Required intubation 8/12 after having RR 40, CXR with rt pna c/w aspiration and inability to protect airway. Self extubated 8/15. MBS 8/7 revealed silent aspiration with nectar with eventual cough. Dys 1 texture and honey liquids recommended. BSE today for possibility of po meds until objective completed next date. Type of Study: Bedside Swallow Evaluation Previous Swallow Assessment:  (see HPI) Diet Prior to this Study: NPO (pulled Cortrak) Temperature Spikes Noted: No Respiratory Status: Nasal cannula History of Recent Intubation: Yes Length of Intubations (days): 4 days Date extubated: 01/23/16 (self extubated) Behavior/Cognition: Requires cueing;Lethargic/Drowsy;Cooperative Oral Cavity Assessment: Dry Oral Care Completed by SLP: Yes Oral Cavity - Dentition: Edentulous Vision:  (questionable) Self-Feeding Abilities: Total assist Patient Positioning: Upright in bed Baseline Vocal Quality:  (aphonic) Volitional Cough: Cognitively unable to elicit Volitional Swallow: Unable to elicit    Oral/Motor/Sensory Function Overall Oral Motor/Sensory Function: Generalized oral weakness   Ice Chips Ice chips: Impaired Presentation: Spoon Oral Phase Impairments: Reduced labial seal;Reduced lingual movement/coordination Oral Phase Functional Implications: Prolonged oral transit Pharyngeal Phase Impairments: Suspected  delayed Swallow;Decreased hyoid-laryngeal movement;Wet Vocal Quality   Thin Liquid Thin Liquid:  Impaired Presentation: Spoon Oral Phase Impairments: Reduced labial seal;Reduced lingual movement/coordination Pharyngeal  Phase Impairments: Suspected delayed Swallow;Decreased hyoid-laryngeal movement;Cough - Immediate    Nectar Thick Nectar Thick Liquid: Not tested   Honey Thick Honey Thick Liquid: Not tested   Puree Puree: Impaired Presentation: Spoon Oral Phase Impairments: Reduced lingual movement/coordination Pharyngeal Phase Impairments: Multiple swallows;Suspected delayed Swallow   Solid   GO   Solid: Not tested        [name] 01/23/2016,3:02 PM   [name] [name].Ed [employer] [phone]

## 2016-01-23 NOTE — Progress Notes (Signed)
Order for IP Rehab was discontinued by Critical Care.  Will sign off given events from last 24 hours.  Please call if questions.  [facility] Admissions Coordinator Cell [phone] Office [phone]

## 2016-01-23 NOTE — Consult Note (Signed)
Consultation Note Date: 01/23/2016   Patient Name: [name]  DOB: Sep 14, 1945  MRN: [id number]  Age / Sex: 70 y.o., male  PCP: No Pcp Per Patient Referring Physician: Rosalin Hawking, MD  Reason for Consultation: Establishing goals of care  HPI/Patient Profile: 70 y.o. male  with past medical history of NHL, CHB with PPM, EF 15%, CAF,HTN,GIB, homeless without family support. 8/6 he had a scuffle with [employer] and "fell off a bench". Facial abrasions noted. He came to [facility] ER for weakness on left side and CT scan ICA thrombus and he required intubation to protect his airway. CT 8/12 showed increased edema and mass effect. Ethics committee has been consulted and decision among healthcare providers involved in his care for DNR and extubation (he self extubated this morning). Concern for swallow and nutrition. Awaiting SLP eval and he has pulled out his temporary feeding tube.   Clinical Assessment and Goals of Care: I have reviewed records from [name] - appears that he has multiple medical record numbers. He has had multiple admission for GIB but has often signed out AMA. Has remained full code. e appears thin and very frail.   I met today with [name]. He appears thin and very frail. He is restless and thrashing in the bed with mittens. He mumbles to me and is very difficult to understand. I was able to articulate that he knows where he is "[name]" and he was able to tell me his name and birthdate. He would not always answer me and is slow to make eye contact. He would not engage with me long and is very impulsive. Unable to assess his GOC at this time. However, I will continue to follow for any improvements in mental state where he is able to participate in further discussion.   Unfortunately there is no family or Optician, dispensing. Updated by Danelle Earthly, CSW that he has an upcoming hearing 01/31/2016 for legal  guardianship. Likely will need decisions prior to this date and at this time this falls to his medical providers with the assistance of ethics committee. Doubt that he will pass swallow study given his inattentiveness. At this time I would not be supportive of long term feeding tube given his state of likely suffering (and he is likely to pull this out).   HCPOA no legal decision maker at this time. Healthcare providers currently and jointly making decisions in the best interest of the patient.     Code Status/Advance Care Planning:  DNR - I do support this decision given significant stroke and likely poor QOL   Symptom Management:   Complains of leg pain: NPO so will give fentanyl 12.5 IV every 2 hours prn.   Agitation: May consider low dose haldol 1-2 mg prn.   Palliative Prophylaxis:   Aspiration and Delirium Protocol  Additional Recommendations (Limitations, Scope, Preferences):  No Artificial Feeding  Psycho-social/Spiritual:   Desire for further Chaplaincy support:yes  Additional Recommendations: Caregiving  Support/Resources and Referral to Community Resources   Prognosis:  Prognosis poor with multiple medical issues, h/o poor adherence and follow up, and now stroke. With artificial feeding life may be prolonged but anticipate poor QOL. Without artificial feeding most likely weeks or less and likely candidate for hospice facility.   Discharge Planning: To Be Determined      Primary Diagnoses: Present on Admission: . 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 The following aspects are pertinent.  Past Medical History:  Diagnosis Date  . A-fib ([place])   . Cancer ([place])    non hodgkins  . Complete heart block ([place])   . GIB (gastrointestinal bleeding)   . Hyperlipidemia   . Hypertension   . Tubular adenoma    Social History   Social History    . Marital status: Single    Spouse name: N/A  . Number of children: N/A  . Years of education: N/A   Social History Main Topics  . Smoking status: Current Every Day Smoker  . Smokeless tobacco: Never Used  . Alcohol use Yes  . Drug use:     Types: Marijuana  . Sexual activity: Not Asked   Other Topics Concern  . None   Social History Narrative  . None   History reviewed. No pertinent family history. Scheduled Meds: .  stroke: mapping our early stages of recovery book   Does not apply Once  . antiseptic oral rinse  7 mL Mouth Rinse 10 times per day  . aspirin  300 mg Rectal Daily   Or  . aspirin  325 mg Oral Daily  . bethanechol  5 mg Oral TID  . chlorhexidine gluconate (SAGE KIT)  15 mL Mouth Rinse BID  . diltiazem  60 mg Oral Q6H  . divalproex  500 mg Oral Daily  . enoxaparin (LOVENOX) injection  40 mg Subcutaneous Q24H  . famotidine  40 mg Per Tube BID  . insulin aspart  2-6 Units Subcutaneous Q4H  . piperacillin-tazobactam (ZOSYN)  IV  3.375 g Intravenous Q8H   Continuous Infusions: . feeding supplement (VITAL AF 1.2 CAL) 1,000 mL (01/23/16 1100)   PRN Meds:.acetaminophen, fentaNYL (SUBLIMAZE) injection, glycopyrrolate, ipratropium-albuterol Medications Prior to Admission:  Prior to Admission medications   Medication Sig Start Date End Date Taking? Authorizing Provider  albuterol (PROVENTIL HFA;VENTOLIN HFA) 108 (90 Base) MCG/ACT inhaler Inhale 2 puffs into the lungs every 4 (four) hours as needed for wheezing or shortness of breath.    Historical Provider, MD  albuterol (PROVENTIL) (2.5 MG/3ML) 0.083% nebulizer solution Take 2.5 mg by nebulization every 6 (six) hours as needed for wheezing or shortness of breath.    Historical Provider, MD  allopurinol (ZYLOPRIM) 100 MG tablet Take 100 mg by mouth daily.    Historical Provider, MD  aspirin EC 81 MG tablet Take 81 mg by mouth daily.    Historical Provider, MD  carvedilol (COREG) 6.25 MG tablet Take 6.25 mg by mouth  2 (two) times daily with a meal.    Historical Provider, MD  cephALEXin (KEFLEX) 500 MG capsule Take 500 mg by mouth 2 (two) times daily.    Historical Provider, MD  oxyCODONE-acetaminophen (PERCOCET/ROXICET) 5-325 MG tablet Take 1 tablet by mouth every 6 (six) hours as needed for severe pain.    Historical Provider, MD  sertraline (ZOLOFT) 25 MG tablet Take 25 mg by mouth daily.    Historical Provider, MD   No Known  Allergies Review of Systems  Unable to perform ROS: Other  Constitutional:       Complaints of "leg pain" but not able to describe further. Difficult to understand his speech to review further ROS.     Physical Exam  Constitutional: He appears cachectic. He appears ill.  Frail  HENT:  Head: Head is with contusion.  Cardiovascular: An irregularly irregular rhythm present. Tachycardia present.   Pulmonary/Chest: No accessory muscle usage. No tachypnea. No respiratory distress. He has rhonchi in the right upper field, the right lower field and the left upper field.  Abdominal: Soft. Normal appearance.  Neurological: He is alert.  Able to articulate he is at "[facility]" and tell me his name and birthdate. Very difficult to understand and does not always answer questions - not even with nod of head yes/no.   Psychiatric: He is agitated. He expresses impulsivity.  Nursing note and vitals reviewed.   Vital Signs: BP (!) 134/91   Pulse 92   Temp 97.6 F (36.4 C) (Oral)   Resp 19   Ht '5\' 9"'  (1.753 m)   Wt 46.1 kg (101 lb 10.1 oz)   SpO2 98%   BMI 15.01 kg/m  Pain Assessment: CPOT   Pain Score: Asleep   SpO2: SpO2: 98 % O2 Device:SpO2: 98 % O2 Flow Rate: .O2 Flow Rate (L/min): 2 L/min  IO: Intake/output summary:  Intake/Output Summary (Last 24 hours) at 01/23/16 1320 Last data filed at 01/23/16 1100  Gross per 24 hour  Intake          1386.67 ml  Output             1015 ml  Net           371.67 ml    LBM: Last BM Date: 01/21/16 Baseline Weight: Weight: 40.8  kg (90 lb) Most recent weight: Weight: 46.1 kg (101 lb 10.1 oz)     Palliative Assessment/Data: PPS: 30%   Flowsheet Rows   Flowsheet Row Most Recent Value  Intake Tab  Referral Department  Critical care  Unit at Time of Referral  ICU  Palliative Care Primary Diagnosis  Neurology  Date Notified  01/23/16  Palliative Care Type  New Palliative care  Reason for referral  Clarify Goals of Care  Date of Admission  01/14/16  Date first seen by Palliative Care  01/23/16  # of days Palliative referral response time  0 Day(s)  # of days IP prior to Palliative referral  9  Clinical Assessment  Psychosocial & Spiritual Assessment  Palliative Care Outcomes      Time In: 1230 Time Out: 1330 Time Total: 60mn Greater than 50%  of this time was spent counseling and coordinating care related to the above assessment and plan.  Signed by: PPershing Proud NP   Please contact Palliative Medicine Team phone at [phone] questions and concerns.  For individual provider: See AShea Evans

## 2016-01-23 NOTE — Progress Notes (Signed)
Pharmacy Antibiotic Note  [name] is a 70 y.o. male admitted on 01/14/2016 with stroke.  Today pt has developed worsening mental status and increased work of breathing requiring intubation.  Concern for aspiration pneumonia supported by new CXR.  Pharmacy has been consulted for zosyn and vancomycin dosing. Afebrile, WBC 20.2, SCr 0.89, CrCl ~51.  Plan: Vancomycin 1000mg  IV every 24 hours.  Goal trough 15-20 mcg/mL. Zosyn 3.375g IV q8h (4 hour infusion).  Follow up c/s  Height: 5\' 9"  (175.3 cm) Weight: 101 lb 10.1 oz (46.1 kg) IBW/kg (Calculated) : 70.7  Temp (24hrs), Avg:97.6 F (36.4 C), Min:97.5 F (36.4 C), Max:97.7 F (36.5 C)   Recent Labs Lab 01/20/16 0514 01/20/16 1458 01/21/16 1030 01/22/16 0650 01/23/16 0538  WBC 12.6* 17.5*  --  20.2*  --   CREATININE 0.75 1.02 1.17 0.88 0.89    Estimated Creatinine Clearance: 51.8 mL/min (by C-G formula based on SCr of 0.89 mg/dL).    No Known Allergies  Antimicrobials this admission: 8/12 Zosyn >>  8/12 Vanc >>  Dose adjustments this admission: n/a  Microbiology results: 8/6 MRSA PCR: negative  Thank you for allowing pharmacy to be a part of this patient's care.  Georga Bora, PharmD Clinical Pharmacist Pager: [phone] 01/23/2016 8:19 AM

## 2016-01-24 ENCOUNTER — Inpatient Hospital Stay (HOSPITAL_COMMUNITY): Payer: Medicare Other

## 2016-01-24 ENCOUNTER — Encounter (HOSPITAL_COMMUNITY): Admission: EM | Disposition: A | Discharge status: Hospice | Payer: Self-pay | Source: Home / Self Care

## 2016-01-24 DIAGNOSIS — Z515 Encounter for palliative care: Secondary | ICD-10-CM

## 2016-01-24 LAB — GLUCOSE, CAPILLARY
Glucose-Capillary: 104 mg/dL — ABNORMAL HIGH (ref 65–99)
Glucose-Capillary: 49 mg/dL — ABNORMAL LOW (ref 65–99)
Glucose-Capillary: 63 mg/dL — ABNORMAL LOW (ref 65–99)
Glucose-Capillary: 106 mg/dL — ABNORMAL HIGH (ref 65–99)
Glucose-Capillary: 83 mg/dL (ref 65–99)
Glucose-Capillary: 87 mg/dL (ref 65–99)

## 2016-01-24 LAB — PHOSPHORUS
PHOSPHORUS: 3.8 mg/dL (ref 2.5–4.6)
PHOSPHORUS: 3.7 mg/dL (ref 2.5–4.6)

## 2016-01-24 LAB — CBC
HEMATOCRIT: 37.4 % — AB (ref 39.0–52.0)
HEMOGLOBIN: 11.1 g/dL — AB (ref 13.0–17.0)
MCH: 29.4 pg (ref 26.0–34.0)
MCHC: 29.7 g/dL — AB (ref 30.0–36.0)
MCV: 99.2 fL (ref 78.0–100.0)
Platelets: 157 10*3/uL (ref 150–400)
RBC: 3.77 MIL/uL — ABNORMAL LOW (ref 4.22–5.81)
RDW: 16.8 % — ABNORMAL HIGH (ref 11.5–15.5)
WBC: 13.4 10*3/uL — ABNORMAL HIGH (ref 4.0–10.5)

## 2016-01-24 LAB — BASIC METABOLIC PANEL
ANION GAP: 5 (ref 5–15)
BUN: 28 mg/dL — ABNORMAL HIGH (ref 6–20)
CALCIUM: 8.5 mg/dL — AB (ref 8.9–10.3)
CO2: 25 mmol/L (ref 22–32)
Chloride: 119 mmol/L — ABNORMAL HIGH (ref 101–111)
Creatinine, Ser: 0.83 mg/dL (ref 0.61–1.24)
GFR calc Af Amer: >60 mL/min (ref >60)
GFR calc non Af Amer: >60 mL/min (ref >60)
GLUCOSE: 120 mg/dL — AB (ref 65–99)
Potassium: 4 mmol/L (ref 3.5–5.1)
Sodium: 149 mmol/L — ABNORMAL HIGH (ref 135–145)

## 2016-01-24 LAB — MAGNESIUM
MAGNESIUM: 2.1 mg/dL (ref 1.7–2.4)
MAGNESIUM: 2.1 mg/dL (ref 1.7–2.4)

## 2016-01-24 SURGERY — EGD (ESOPHAGOGASTRODUODENOSCOPY)
Anesthesia: Moderate Sedation

## 2016-01-24 MED ORDER — DEXTROSE 50 % IV SOLN
25.0000 mL | Freq: Once | INTRAVENOUS | Status: AC
  Administered 2016-01-24: 25 mL via INTRAVENOUS

## 2016-01-24 MED ORDER — VITAL HIGH PROTEIN PO LIQD
1000.0000 mL | ORAL | Status: DC
  Administered 2016-01-24: 20:00:00
  Administered 2016-01-24 – 2016-01-25 (×2): 1000 mL
  Filled 2016-01-24 (×2): qty 1000

## 2016-01-24 MED ORDER — PRO-STAT SUGAR FREE PO LIQD
30.0000 mL | Freq: Two times a day (BID) | ORAL | Status: DC
  Administered 2016-01-24 – 2016-01-25 (×2): 30 mL
  Filled 2016-01-24 (×2): qty 30

## 2016-01-24 MED ORDER — DEXTROSE 50 % IV SOLN
INTRAVENOUS | Status: AC
  Filled 2016-01-24: qty 50

## 2016-01-24 NOTE — Progress Notes (Signed)
Patient transferred to 5W-11 via bed. Assigned RN met patient in room and was updated on status.  Patient transferred over to 5W bed and left with assigned RN in room.

## 2016-01-24 NOTE — Progress Notes (Signed)
Palliative:  I came to see [name] again today. Unfortunately he is more lethargic and will not consistently answer orientation questions, unable to converse with me, and he just feel back asleep as I was talking with him. Even though he is somewhat oriented this is not consistent and he is unable to converse with me to further assess his wishes for his care. MBS pending but I have doubts he will be successful. Will likely need further involvement from ethics to assist with next decision on nutrition and PEG placement. Will contact ethics. I still doubt the added benefit of PEG placement as he has previously self extubated and pulled out temporary feeding tube. Discussed with Dr. Erlinda Hong. Will follow up with ethics and see again tomorrow.   Vinie Sill, NP Palliative Medicine Team Pager # [phone] (M-F 8a-5p) Team Phone # [phone] (Nights/Weekends)

## 2016-01-24 NOTE — Progress Notes (Signed)
STROKE TEAM PROGRESS NOTE   SUBJECTIVE (INTERVAL HISTORY) Self extubated yesterday. Awake and alert this morning. Unable to verbalize but is able to mouth some answers.   OBJECTIVE Temp:  [97.5 F (36.4 C)-98.6 F (37 C)] 97.7 F (36.5 C) (08/16 1200) Pulse Rate:  [34-115] 107 (08/16 1103) Cardiac Rhythm: Atrial fibrillation (08/16 0800) Resp:  [11-27] 23 (08/16 1300) BP: (110-158)/(58-106) 110/69 (08/16 1103) SpO2:  [84 %-99 %] 92 % (08/16 0800) Weight:  [44.5 kg (98 lb 1.7 oz)] 44.5 kg (98 lb 1.7 oz) (08/16 0500)  CBC:   Recent Labs Lab 01/20/16 0514  01/22/16 0650 01/24/16 0530  WBC 12.6*  < > 20.2* 13.4*  NEUTROABS 10.5*  --   --   --   HGB 12.0*  < > 10.6* 11.1*  HCT 39.9  < > 35.3* 37.4*  MCV 97.6  < > 97.5 99.2  PLT 190  < > 150 157  < > = values in this interval not displayed.  Basic Metabolic Panel:  Recent Labs Lab 01/21/16 0104  01/23/16 0538 01/24/16 0530  NA 153*  < > 154* 149*  K  --   < > 4.5 4.0  CL  --   < > 125* 119*  CO2  --   < > 25 25  GLUCOSE  --   < > 137* 120*  BUN  --   < > 34* 28*  CREATININE  --   < > 0.89 0.83  CALCIUM  --   < > 8.3* 8.5*  MG 1.9  --   --   --   PHOS 3.2  --   --   --   < > = values in this interval not displayed.  Lipid Panel:     Component Value Date/Time   CHOL 104 01/15/2016 0405   TRIG 46 01/15/2016 0405   HDL 31 (L) 01/15/2016 0405   CHOLHDL 3.4 01/15/2016 0405   VLDL 9 01/15/2016 0405   LDLCALC 64 01/15/2016 0405   HgbA1c:  Lab Results  Component Value Date   HGBA1C 6.7 (H) 01/15/2016    IMAGING I have personally reviewed the radiological images below and agree with the radiology interpretations.  Ct Head Wo Contrast 01/22/2016 IMPRESSION: Little change in appearance from the most recent study in the large subacute infarct involving the right ACA and MCA distributions. The degree of midline shift is unchanged, although there is slightly more dilatation of the left lateral  ventricle.  01/20/2016 Right ACA and MCA infarct similar distribution to the prior study. There is no acute hemorrhage. There is increased edema and mass-effect with increased midline shift. Progressive obstruction of the left lateral ventricle.   01/17/2016 1. Large evolving subacute right MCA territory infarct, with associated smaller right ACA territory infarct. No evidence for hemorrhagic transformation. 2. Worsened localized edema with increased 11 mm of right-to-left shift. No hydrocephalus. Slightly worsened asymmetric dilatation of the left lateral ventricle concerning for possible developing ventricular trapping.   01/14/2016 Hyperdense right MCA indicative of thrombus, with evidence of subacute ischemia in the right MCA distribution,   Ct Angio Head and neck W Or Wo Contrast 01/15/2016 1. 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 Nonhemorrhagic acute infarct throughout the right MCA territory and in the distal right ACA territory. 2. Remote left parietal occipital infarct. 3. Extensive debris in the central airways. Correlate for symptoms of aspiration.   Ct Cervical Spine Wo Contrast 01/14/2016  1. Hyperdense right MCA  indicative of thrombus, with evidence of subacute ischemia in the right MCA distribution, as above. 2. No evidence of significant acute traumatic injury to the facial bones or cervical spine. Critical Value/emergent results were called by telephone at the time of interpretation on 01/14/2016 at 4:34 pm to Dr. Shon Hale, who verbally acknowledged these results. Electronically Signed   By: Vinnie Langton M.D.   On: 01/14/2016 16:54   Ct Chest W Contrast Ct Abdomen Pelvis W Contrast 01/14/2016 1. Filling defect in the apex of the left ventricle highly concerning for thrombus. This may suggest an embolic source as a cause of the patient's right MCA occlusion. This was discussed by telephone at the time of interpretation on 01/14/2016 at 5:02 pm to Dr. Shon Hale, who  verbally acknowledged these results. 2. No evidence of significant acute traumatic injury to the chest, abdomen or pelvis. 3. The appearance of the lungs suggests mild congestive heart failure, with mild interstitial pulmonary edema and trace bilateral pleural effusions. 4. 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 Whether this is chronic (favored), or sequela of large embolic event is uncertain. 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 Celiac axis and SMA are both patent. 5. Area in the spleen which is likely related to prior splenic infarct, suggesting prior embolic event. 6. Cardiomegaly with left ventricular and left atrial dilatation. 7. Aortic atherosclerosis, in addition to 3 vessel coronary artery disease. Please note that although the presence of coronary artery calcium documents the presence of coronary artery disease, the severity of this disease and any potential stenosis cannot be assessed on this non-gated CT examination. Assessment for potential risk factor modification, dietary therapy or pharmacologic therapy may be warranted, if clinically indicated. 8. Additional incidental findings, as above.   Dg Chest Port 1 View 01/22/2016 1.  Stable lines and tubes. 2. Pulmonary hyperinflation.  No acute cardiopulmonary abnormality.  01/21/2016 No acute abnormalities.  01/20/2016 Endotracheal tube placed Right basilar pneumonia has improved.  01/20/2016 Increased right mid lower lung airspace opacities which may represent aspiration or infection. Enteric tube courses inferior to the diaphragm. Left IJ central venous catheter tip projects over the superior vena cava.  01/15/2016 Left IJ central line in place.  No pneumothorax.   2-D echocardiogram - Left ventricle: The cavity size was mildly dilated. Wall   thickness was normal. Systolic function was severely reduced. The   estimated ejection fraction was 15%.  Diffuse hypokinesis. - Aortic valve: There was mild regurgitation. - Mitral valve: Calcified annulus. There was moderate   regurgitation. - Left atrium: The atrium was severely dilated. - Right atrium: The atrium was mildly dilated. Impressions: - Severe global reduction in LV function; mild AI; moderate MR;   biatrial enlargement; mild TR.  EEG -  1. Mild diffuse slowing of the waking background. 2. Focal slowing over the left hemisphere. There were no epileptiform discharges or electrographic seizures seen.  PHYSICAL EXAM  Temp:  [97.5 F (36.4 C)-98.6 F (37 C)] 97.7 F (36.5 C) (08/16 1200) Pulse Rate:  [34-115] 107 (08/16 1103) Resp:  [11-27] 23 (08/16 1300) BP: (110-158)/(58-106) 110/69 (08/16 1103) SpO2:  [84 %-99 %] 92 % (08/16 0800) Weight:  [44.5 kg (98 lb 1.7 oz)] 44.5 kg (98 lb 1.7 oz) (08/16 0500)  General - malnourished with lack of muscle mass  Ophthalmologic - Fundi not visualized due to noncooperation.  Cardiovascular - irregularly irregular heart rate and rhythm.  Neuro - Awake and  alert, following peripheral commands on the right. PERRL, R gaze preference but able to cross midline. Positive corneal and gag. Against forced eye opening. Right upper extremity and lower extremity spontaneous movement, purposeful, and strong. Left upper extremity 0/5, left lower extremity proximal 3-/5 and distal 0/5. Muscle tone decreased on the left, DTR 1+, Babinski negative. Sensation, coordination, and gait not tested.   ASSESSMENT/PLAN [name] is a 70 y.o. male with history of AFib not on anticoagulation, S/P pacer, non-hodgkins lymphoma, GIB with tubular adenoma, HLD, and HTN presenting after a fall off a park bench with L sided weakness. He did not receive IV t-PA or IR due to high ASPECTS score.   Stroke:  Right MCA infarct in setting of hyperdense R MCA, felt to be embolic secondary to known atrial fibrillation not on anticoagulation vs cardiomyopathy (EF  15%)  Resultant  left hemiplegia, VDRF  CTA head and neck distal right ICA thrombus extending into right M1 and A1 segments. right MCA and right ACA territory infarct. Old left parietal occipital infarct.  MRI  / MRA  Pacemaker  CT repeat x 3 increasing edema and left lateral ventricle hydrocephalus  2D Echo  EF 15%. No source of embolus. Diffuse hypokinesis.  EEG no definite seizure activity, focal slowing on the left. Mildly diffuse slow.  LDL 64  HgbA1c 6.7  SCDs for VTE prophylaxis Diet NPO time specified  aspirin 81 mg daily prior to admission, now on aspirin 325 mg daily.   Ongoing aggressive stroke risk factor management  Therapy recommendations:  SNF  Disposition:  pending (homeless. recently discharged from jail, no family, reportedly has a girlfriend)  Deposition   Neuro and respiratory worsening 01/20/2016 - decreased level of reponsivness, tachycardia, tachypnea, fever, and chest congestion.   Repeat CT increasing edema, midline shift, and left lateral ventricle hydrocephalus  Poor neurologic prognosis, given large right MCA stroke, hydrocephalus, cerebral edema, PTA lymphoma, lack of social support  Agreeable to DO NOT RESUSCITATE   VDRF, Right lower lobe aspiration pneumonia resolved  Intubated 01/20/2016 for respiratory distress that occurred following ativan dosing  CCM on board  Self extubated 8/15  On vancomycin and Zosyn   Repeat CXR resolved RLL pneumonia  Cerebral edema, induced hypernatremia  Treated with 3% saline, now off  Na 153->154->149  Will let Na drift down  Obstructive hydrocephalus  Increasing left lateral ventricle hydrocephalus  Not a good candidate for EVD placement  Continue observation  Cardiomyopathy  EF 15%  Pacer in place  Not good candidate for anticoagulation due to social condition and noncompliance  On aspirin 325  Dysphagia  Secondary to stroke  CorTrack placed and on tube feedings  MBS  today  Consider PEG tube if pt able to tolerate extubation  Chronic Atrial Fibrillation  Home anticoagulation:  none   Not a good anticoagulation candidate so far secondary to social situation and compliance  On aspirin   Ongoing treatment with Cardizem   Leukocytosis  WBC 17.5-> 20.2-> 13.4  On Zosyn  CCM following  Hypertension  Stable Long-term BP goal normotensive  Hyperlipidemia  Hx of HLD but not on home meds  LDL 65, at goal < 70  Tobacco abuse  Current smoker  Smoking cessation counseling provided  Other Stroke Risk Factors  Advanced age  ETOH use, advised to drink no more than 2 drink(s) a day  Marijuana use, UDS not done this admission   Other Active Problems  History non-Hodgkin's lymphoma  History of GI bleeding  History tubular adenoma  History of complete heart block s/p pacer  Lower extremities leg wounds - patient states from insect bite - local pruritis, developing wounds. WOC consulted to evaluate, recommend treatment -> Cleanse left hip wound with normal saline, pat dry. Cover wound bed with Xeroform gauze and secure with foam dressing. Change every other day. (2) Apply protective foam dressing to left malleolus and left knee. Change prn    Hospital day # 10  Discussed with Dr. Marval Regal, MD  Internal Medicine PGY-3 01/24/2016 1:52 PM     To contact Stroke Continuity provider, please refer to [email or web address] After hours, contact General Neurology

## 2016-01-24 NOTE — Progress Notes (Signed)
70 year old homeless man with cardiomyopathy EF 15% and permanent pacemaker admitted 8/6 with a right MCA CVA intubated 8/12 for aspiration pneumonia,   On exam-cachectic, chronically ill-appearing, follows commands moves right side spontaneously, left hemiplegia, mouthing words decreased air movement bilateral, multiple abrasions over  face and arms  Labs show hypernatremia -improved Chest x-ray 8/14  shows mild bibasilar interstitial markings Impression/plan- Acute respiratory failure- he self extubated and seems to be doing well from a breathing standpoint, good cough, however remains high risk aspiration  See ethics note from 8/14, he may be able to regain some capacity Will involve palliative care at this point Maintain DO NOT RESUSCITATE status  Aspiration pneumonia-dc vancomycin, ct Zosyn until 8/17, then stop swallow evaluation -MBS planned   PT to assess - SNF eventually  PCCM available as needed  Rigoberto Noel. MD

## 2016-01-24 NOTE — Progress Notes (Signed)
Speech Language Pathology  Patient Details Name: [name] MRN: [id number] DOB: Oct 15, 1945 Today's Date: 01/24/2016 Time:  -      Plan was for MBS today which was unable to be performed. Plan on MBS tomorrow. RN notified.   Orbie Pyo [place].[employer] [phone]

## 2016-01-24 NOTE — Progress Notes (Signed)
[facility] Progress Note Patient Name: Klint Kaaihue DOB: 1945-09-19 MRN: [id number]   Date of Service  01/24/2016  HPI/Events of Note  ELink was contacted by patient advocate Lorn Junes who was questioning the decision of the ethics committee to make the pt DNR. He wanted the treatment team know that that he believes the pt should get aggressive care.  eICU Interventions  I discussed the pt care till date and let him know that we are still medically treating him and not withdrawing care. We will let the DNR status stay in place and let the day team, palliative care and ethics committee address the goals of care in AM.        Josehua Hammar 01/24/2016, 7:19 PM

## 2016-01-24 NOTE — Evaluation (Signed)
Physical Therapy Evaluation Patient Details Name: [name] MRN: [id number] DOB: 03-21-1946 Today's Date: 01/24/2016   History of Present Illness  70 y.o. male admitted to [facility] on 01/14/16 with left sided weakness.  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  Pt with significant PMHx of HTN, GIB, complete heart block, a-fib, and pacemaker.On 01/20/16 pt developed increased WOB, not responding to painful stimuli, stat CT ordered showing worsening midline shift without any hemorrhagic transformation.  Pt was transfered back to the ICU from the stepdown unit and intubated.  Tube feeds stopped.  Pt self extubated on 01/23/16.    Clinical Impression  Pt re-evaluation preformed today.  He was the most alert and participative that I have seen him here in the hospital.  He was able to follow simple, one step commands 75% of the time and knew where he was, what his name was, and the year.  He reported his left arm and leg were weak.  He did fatigue quickly EOB, but was much more engaged in therapy today compared to previous sessions.  Continue to recommend SNF at discharge.      Follow Up Recommendations SNF (possible hospice SNF?)    Equipment Recommendations  Wheelchair (measurements PT);Wheelchair cushion (measurements PT);3in1 (PT);Hospital bed    Recommendations for Other Services   NA    Precautions / Restrictions Precautions Precautions: Fall Precaution Comments: L hemi, L inattention/neglect      Mobility  Bed Mobility Overal bed mobility: Needs Assistance Bed Mobility: Supine to Sit;Sit to Supine     Supine to sit: Max assist Sit to supine: Max assist   General bed mobility comments: Pt with some initiation at trunk in both directions after therapist manually initiates movement, already had right leg over EOB when PT entered the room.      Modified Rankin (Stroke Patients Only) Modified Rankin (Stroke Patients Only) Pre-Morbid Rankin Score: No  symptoms Modified Rankin: Severe disability     Balance Overall balance assessment: Needs assistance Sitting-balance support: Feet supported;Single extremity supported Sitting balance-Leahy Scale: Zero Sitting balance - Comments: max assist pt pushing to the left when right arm is supported.  With manual assist and putting right hand prop further from body pt could get to min-mod assist for sitting balance EOB.  Tendancy towards left and posterior LOB.   Postural control: Posterior lean;Left lateral lean                                   Pertinent Vitals/Pain Pain Assessment: Faces Faces Pain Scale: Hurts even more Pain Location: rubbing bil thighs, reporting chest and abdominal pain, inconsistant answers/reports with each inquiry of where he hurts Pain Descriptors / Indicators: Grimacing;Guarding Pain Intervention(s): Limited activity within patient's tolerance;Monitored during session;Repositioned    Home Living Family/patient expects to be discharged to:: Skilled nursing facility                      Prior Function Level of Independence: Independent               Hand Dominance   Dominant Hand: Right    Extremity/Trunk Assessment   Upper Extremity Assessment: LUE deficits/detail       LUE Deficits / Details: flaccid, subluxed L upper extremity       LLE Deficits / Details: as far as I can tell, functionally, flaccid left lower extremity.  Keeps it flexed.   Cervical / Trunk Assessment: Kyphotic  Communication   Communication: Expressive difficulties  Cognition   Behavior During Therapy: Restless Overall Cognitive Status: Impaired/Different from baseline Area of Impairment: Orientation;Attention;Memory;Following commands;Safety/judgement;Awareness;Problem solving Orientation Level: Disoriented to;Time (Did not know the month, ) Current Attention Level: Focused (with brief periods of sustained) Memory: Decreased short-term  memory Following Commands: Follows one step commands inconsistently;Follows one step commands with increased time (with increased time and mutlimodal cues) Safety/Judgement: Decreased awareness of safety;Decreased awareness of deficits Awareness: Intellectual (heading towards emergent, reported L arm/leg weak) Problem Solving: Slow processing;Decreased initiation;Difficulty sequencing;Requires verbal cues;Requires tactile cues General Comments: This is the most alert I have seen pt, following some commands, and more aware of where he is.  Verbalizing his name, [name], 2017.  He reports he had a stroke and his left side is weak.  He did have decreased arousal at the end of our session when he fatigued.     General Comments General comments (skin integrity, edema, etc.): VSS throughout despite decreased level of alertness at end of session.            Assessment/Plan    PT Assessment Patient needs continued PT services  PT Diagnosis Difficulty walking;Abnormality of gait;Generalized weakness;Acute pain;Hemiplegia non-dominant side;Altered mental status   PT Problem List Decreased strength;Decreased range of motion;Decreased activity tolerance;Decreased balance;Decreased mobility;Decreased coordination;Decreased cognition;Decreased knowledge of use of DME;Decreased safety awareness;Decreased knowledge of precautions;Impaired tone;Impaired sensation;Pain  PT Treatment Interventions DME instruction;Gait training;Stair training;Functional mobility training;Therapeutic activities;Therapeutic exercise;Neuromuscular re-education;Balance training;Cognitive remediation;Patient/family education;Wheelchair mobility training   PT Goals (Current goals can be found in the Care Plan section) Acute Rehab PT Goals Patient Stated Goal: pt wants water PT Goal Formulation: Patient unable to participate in goal setting Time For Goal Achievement: 02/07/16 Potential to Achieve Goals: Fair    Frequency Min  3X/week   Barriers to discharge Inaccessible home environment;Decreased caregiver support per chart pt is homeless, palliative care and ethics comittee involved.         End of Session   Activity Tolerance: Patient limited by fatigue Patient left: in bed;with bed alarm set Nurse Communication: Mobility status         Time: [id number] PT Time Calculation (min) (ACUTE ONLY): 23 min   Charges:   PT Evaluation $PT Eval High Complexity: 1 Procedure PT Treatments $Therapeutic Activity: 8-22 mins        Arvella Massingale B. Bradford, [place], DPT [phone]   01/24/2016, 11:37 AM

## 2016-01-25 ENCOUNTER — Inpatient Hospital Stay (HOSPITAL_COMMUNITY): Payer: Medicare Other

## 2016-01-25 LAB — GLUCOSE, CAPILLARY
GLUCOSE-CAPILLARY: 120 mg/dL — AB (ref 65–99)
GLUCOSE-CAPILLARY: 74 mg/dL (ref 65–99)
Glucose-Capillary: 109 mg/dL — ABNORMAL HIGH (ref 65–99)
Glucose-Capillary: 116 mg/dL — ABNORMAL HIGH (ref 65–99)
Glucose-Capillary: 155 mg/dL — ABNORMAL HIGH (ref 65–99)
Glucose-Capillary: 180 mg/dL — ABNORMAL HIGH (ref 65–99)
Glucose-Capillary: 85 mg/dL (ref 65–99)

## 2016-01-25 LAB — MAGNESIUM
Magnesium: 1.9 mg/dL (ref 1.7–2.4)
Magnesium: 1.9 mg/dL (ref 1.7–2.4)

## 2016-01-25 LAB — PHOSPHORUS
Phosphorus: 2.9 mg/dL (ref 2.5–4.6)
Phosphorus: 3.4 mg/dL (ref 2.5–4.6)

## 2016-01-25 MED ORDER — LABETALOL HCL 5 MG/ML IV SOLN
10.0000 mg | INTRAVENOUS | Status: DC | PRN
  Administered 2016-01-25: 10 mg via INTRAVENOUS
  Filled 2016-01-25: qty 4

## 2016-01-25 MED ORDER — OSMOLITE 1.2 CAL PO LIQD
1000.0000 mL | ORAL | Status: DC
  Administered 2016-01-25: 1000 mL
  Filled 2016-01-25 (×3): qty 1000

## 2016-01-25 MED ORDER — SODIUM CHLORIDE 0.9% FLUSH
10.0000 mL | INTRAVENOUS | Status: DC | PRN
  Administered 2016-01-26: 30 mL
  Filled 2016-01-25: qty 40

## 2016-01-25 NOTE — Progress Notes (Signed)
Patient doesn't follow commands therefore staff unable to do neuro check. Will continue to monitor.

## 2016-01-25 NOTE — Progress Notes (Signed)
Nutrition Follow-up  DOCUMENTATION CODES:   Severe malnutrition in context of social or environmental circumstances, Underweight  INTERVENTION:   -Initiate Osmolite 1.2 @ 20 ml/hr via cortrak tube and increase by 10 ml every 4 hours to goal rate of 60 ml/hr.   Tube feeding regimen provides 1728 kcal (100% of needs), 80 grams of protein, and 1204 ml of H2O.   NUTRITION DIAGNOSIS:   Malnutrition (Severe) related to social / environmental circumstances as evidenced by severe depletion of body fat, severe depletion of muscle mass.  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 Pt found to have acute ischemic R MCA stroke.   Pt self-extubated on 01/23/16.   SLP peformed BSE; recommending continued NPO.   Pt unable to engage in conversation. Noted Vital High Protein hanging on IV pole but TF held.   Case discussed with RN. Pt observed to try to pull feeding tube out. TF has been held currently. Noted KUB obtained this afternoon and tip of tube was confirmed in stomach.   Noted TF orders of Vital High Protein @ 40 ml/hr and 30 ml Prostat BID (provides 1160 kcals, 114 grams protein, and 803 ml fluid daily). Currently meets 77% of estimated kcal needs and >100% of protein needs. RD will adjust regimen to better meet needs.   Per CSW, pt with legal guardianship meeting scheduled for 01/31/16. Palliative care team and ethics committee following as well.   Labs reviewed: K: 3.6.  Diet Order:  Diet NPO time specified  Skin:  Wound (see comment) (stage II hip, lacerations)  Last BM:   01/24/16  Height:   Ht Readings from Last 1 Encounters:  01/20/16 5\' 9"  (1.753 m)    Weight:   Wt Readings from Last 1 Encounters:  01/24/16 100 lb 8.5 oz (45.6 kg)    Ideal Body Weight:  72.7 kg  BMI:  Body mass index is 14.85 kg/m.  Estimated Nutritional Needs:   Kcal:  1550-1750  Protein:  75-90 grams  Fluid:  >1.5 L  EDUCATION NEEDS:   No education needs identified at this time  [name] A. Jimmye Norman, RD, LDN, CDE Pager: [phone] After hours Pager: [phone]

## 2016-01-25 NOTE — Progress Notes (Signed)
Palliative:  Mr. Dress is not answering my questions today. He continues to attempt to pull out NGT even with mittens (actively reaching for tube while I was in the room). Continues to be no guardian and no family located. Noted the concern from patient advocate per Dr. Matilde Bash note 01/24/2016 although I do not know who this person is or how to contact. I did contact ethics team again who agrees that further decision regarding nutrition will be for 2 physicians and the medical team to agree on next steps. I still do not recommend PEG d/t continuing to pull out any tubes we have and refusal for SNF in past and frequent AMA discharges. QOL at this point appears poor and he has not made any significant improvements. I would recommend no PEG which would be a hospice situation. Will discuss further with Dr. Erlinda Hong.   Vinie Sill, NP Palliative Medicine Team Pager # [phone] (M-F 8a-5p) Team Phone # [phone] (Nights/Weekends)

## 2016-01-25 NOTE — Progress Notes (Signed)
CSW continuing to follow for pt needs- discussed pt case with supervisor/ethics committee member- since pt does not have a legal guardian at this time 2 physicians are allowed to make medical decisions for this patient- if physicians unable to agree on plan of care please consult ethics committee  CSW updated palliative team member who will discuss with physicians- plan to address need for PEG vs no PEG and focus on comfort care  CSW will continue to follow for placement needs- likely hospice if physicians decide on comfort vs SNF if physicians decide to proceed with PEG  Jorge Ny, [place] Social Worker [phone]

## 2016-01-25 NOTE — Progress Notes (Signed)
STROKE TEAM PROGRESS NOTE   SUBJECTIVE (INTERVAL HISTORY) Pulled out NG tube today and needed to be re-inserted. Palliative care is contacting ethic committee regarding further care plan. Seems not a good candidate for PEG at this time.    OBJECTIVE Temp:  [97.8 F (36.6 C)-98.6 F (37 C)] 98.6 F (37 C) (08/17 1005) Pulse Rate:  [44-117] 57 (08/17 1500) Cardiac Rhythm: Atrial fibrillation (08/17 0700) Resp:  [17-29] 18 (08/17 1500) BP: (114-171)/(53-95) 141/83 (08/17 1500) SpO2:  [37 %-98 %] 93 % (08/17 1500) Weight:  [100 lb 8.5 oz (45.6 kg)] 100 lb 8.5 oz (45.6 kg) (08/16 2217)  CBC:   Recent Labs Lab 01/20/16 0514  01/22/16 0650 01/24/16 0530  WBC 12.6*  < > 20.2* 13.4*  NEUTROABS 10.5*  --   --   --   HGB 12.0*  < > 10.6* 11.1*  HCT 39.9  < > 35.3* 37.4*  MCV 97.6  < > 97.5 99.2  PLT 190  < > 150 157  < > = values in this interval not displayed.  Basic Metabolic Panel:  Recent Labs Lab 01/23/16 0538 01/24/16 0530  01/24/16 1800 01/25/16 0500  NA 154* 149*  --   --   --   K 4.5 4.0  --   --   --   CL 125* 119*  --   --   --   CO2 25 25  --   --   --   GLUCOSE 137* 120*  --   --   --   BUN 34* 28*  --   --   --   CREATININE 0.89 0.83  --   --   --   CALCIUM 8.3* 8.5*  --   --   --   MG  --   --   < > 2.1 1.9  PHOS  --   --   < > 3.7 2.9  < > = values in this interval not displayed.  Lipid Panel:     Component Value Date/Time   CHOL 104 01/15/2016 0405   TRIG 46 01/15/2016 0405   HDL 31 (L) 01/15/2016 0405   CHOLHDL 3.4 01/15/2016 0405   VLDL 9 01/15/2016 0405   LDLCALC 64 01/15/2016 0405   HgbA1c:  Lab Results  Component Value Date   HGBA1C 6.7 (H) 01/15/2016    IMAGING I have personally reviewed the radiological images below and agree with the radiology interpretations.  Ct Head Wo Contrast 01/22/2016 IMPRESSION: Little change in appearance from the most recent study in the large subacute infarct involving the right ACA and MCA  distributions. The degree of midline shift is unchanged, although there is slightly more dilatation of the left lateral ventricle.  01/20/2016 Right ACA and MCA infarct similar distribution to the prior study. There is no acute hemorrhage. There is increased edema and mass-effect with increased midline shift. Progressive obstruction of the left lateral ventricle.   01/17/2016 1. Large evolving subacute right MCA territory infarct, with associated smaller right ACA territory infarct. No evidence for hemorrhagic transformation. 2. Worsened localized edema with increased 11 mm of right-to-left shift. No hydrocephalus. Slightly worsened asymmetric dilatation of the left lateral ventricle concerning for possible developing ventricular trapping.   01/14/2016 Hyperdense right MCA indicative of thrombus, with evidence of subacute ischemia in the right MCA distribution,   Ct Angio Head and neck W Or Wo Contrast 01/15/2016 1. 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 Nonhemorrhagic acute infarct  throughout the right MCA territory and in the distal right ACA territory. 2. Remote left parietal occipital infarct. 3. Extensive debris in the central airways. Correlate for symptoms of aspiration.   Ct Cervical Spine Wo Contrast 01/14/2016  1. Hyperdense right MCA indicative of thrombus, with evidence of subacute ischemia in the right MCA distribution, as above. 2. No evidence of significant acute traumatic injury to the facial bones or cervical spine. Critical Value/emergent results were called by telephone at the time of interpretation on 01/14/2016 at 4:34 pm to Dr. Shon Hale, who verbally acknowledged these results. Electronically Signed   By: Vinnie Langton M.D.   On: 01/14/2016 16:54   Ct Chest W Contrast Ct Abdomen Pelvis W Contrast 01/14/2016 1. Filling defect in the apex of the left ventricle highly concerning for thrombus. This may suggest an embolic source as a cause of the patient's right  MCA occlusion. This was discussed by telephone at the time of interpretation on 01/14/2016 at 5:02 pm to Dr. Shon Hale, who verbally acknowledged these results. 2. No evidence of significant acute traumatic injury to the chest, abdomen or pelvis. 3. The appearance of the lungs suggests mild congestive heart failure, with mild interstitial pulmonary edema and trace bilateral pleural effusions. 4. 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 Whether this is chronic (favored), or sequela of large embolic event is uncertain. 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 Celiac axis and SMA are both patent. 5. Area in the spleen which is likely related to prior splenic infarct, suggesting prior embolic event. 6. Cardiomegaly with left ventricular and left atrial dilatation. 7. Aortic atherosclerosis, in addition to 3 vessel coronary artery disease. Please note that although the presence of coronary artery calcium documents the presence of coronary artery disease, the severity of this disease and any potential stenosis cannot be assessed on this non-gated CT examination. Assessment for potential risk factor modification, dietary therapy or pharmacologic therapy may be warranted, if clinically indicated. 8. Additional incidental findings, as above.   Dg Chest Port 1 View 01/22/2016 1.  Stable lines and tubes. 2. Pulmonary hyperinflation.  No acute cardiopulmonary abnormality.  01/21/2016 No acute abnormalities.  01/20/2016 Endotracheal tube placed Right basilar pneumonia has improved.  01/20/2016 Increased right mid lower lung airspace opacities which may represent aspiration or infection. Enteric tube courses inferior to the diaphragm. Left IJ central venous catheter tip projects over the superior vena cava.  01/15/2016 Left IJ central line in place.  No pneumothorax.   2-D echocardiogram - Left ventricle: The cavity size was mildly dilated.  Wall   thickness was normal. Systolic function was severely reduced. The   estimated ejection fraction was 15%. Diffuse hypokinesis. - Aortic valve: There was mild regurgitation. - Mitral valve: Calcified annulus. There was moderate   regurgitation. - Left atrium: The atrium was severely dilated. - Right atrium: The atrium was mildly dilated. Impressions: - Severe global reduction in LV function; mild AI; moderate MR;   biatrial enlargement; mild TR.  EEG -  1. Mild diffuse slowing of the waking background. 2. Focal slowing over the left hemisphere. There were no epileptiform discharges or electrographic seizures seen.  PHYSICAL EXAM  Temp:  [97.8 F (36.6 C)-98.6 F (37 C)] 98.6 F (37 C) (08/17 1005) Pulse Rate:  [44-117] 57 (08/17 1500) Resp:  [17-29] 18 (08/17 1500) BP: (114-171)/(53-95) 141/83 (08/17 1500) SpO2:  [37 %-98 %] 93 % (08/17 1500) Weight:  [100 lb 8.5  oz (45.6 kg)] 100 lb 8.5 oz (45.6 kg) (08/16 2217)  General - severely malnourished with lack of muscle mass  Ophthalmologic - Fundi not visualized due to noncooperation.  Cardiovascular - irregularly irregular heart rate and rhythm.  Neuro - sleepy but arousable, following peripheral commands on the right. NG tube in place, PERRL, R gaze preference but able to cross midline. Positive corneal and gag. Against forced eye opening. Right upper extremity and lower extremity spontaneous movement, purposeful, and strong. Left upper extremity 0/5, left lower extremity proximal 3-/5 and distal 0/5. Muscle tone decreased on the left, DTR 1+, Babinski negative. Sensation, coordination, and gait not tested.   ASSESSMENT/PLAN [name] is a 70 y.o. male with history of AFib not on anticoagulation, S/P pacer, non-hodgkins lymphoma, GIB with tubular adenoma, HLD, and HTN presenting after a fall off a park bench with L sided weakness. He did not receive IV t-PA or IR due to high ASPECTS score.   Stroke:  Right MCA infarct  in setting of hyperdense R MCA, felt to be embolic secondary to known atrial fibrillation not on anticoagulation vs cardiomyopathy (EF 15%)  Resultant  left hemiplegia, VDRF  CTA head and neck distal right ICA thrombus extending into right M1 and A1 segments. right MCA and right ACA territory infarct. Old left parietal occipital infarct.  MRI  / MRA  Pacemaker  CT repeat x 3 increasing edema and left lateral ventricle hydrocephalus  2D Echo  EF 15%. No source of embolus. Diffuse hypokinesis.  EEG no definite seizure activity, focal slowing on the left. Mildly diffuse slow.  LDL 64  HgbA1c 6.7  SCDs for VTE prophylaxis Diet NPO time specified  aspirin 81 mg daily prior to admission, now on aspirin 325 mg daily.   Ongoing aggressive stroke risk factor management  Therapy recommendations:  SNF  Disposition:  pending (homeless. recently discharged from jail, no family, reportedly has a girlfriend)  Deposition   Neuro and respiratory worsening 01/20/2016 - decreased level of reponsivness, tachycardia, tachypnea, fever, and chest congestion.   Repeat CT increasing edema, midline shift, and left lateral ventricle hydrocephalus  Poor neurologic prognosis, given large right MCA stroke, hydrocephalus, cerebral edema, PTA lymphoma, lack of social support  Agreeable to DO NOT RESUSCITATE   Self extubated 8/15  Pulled off NG tube 8/17 - re-inserted  Palliative care on board  Ethic committee on board  Right lower lobe aspiration pneumonia resolved  Intubated 01/20/2016 for respiratory distress that occurred following ativan dosing  CCM on board  Self extubated 8/15  vanco d/c'ed  On Zosyn - day 6 - d/c tomorrow  Repeat CXR resolved RLL pneumonia   Cerebral edema, induced hypernatremia  Treated with 3% saline, now off  Na 153->154->149  Will let Na drift down  Obstructive hydrocephalus  Increasing left lateral ventricle hydrocephalus  Not a good candidate for  EVD placement  Continue observation  Repeat CT in am  Cardiomyopathy  EF 15%  Pacer in place  Not good candidate for anticoagulation due to social condition and noncompliance  On aspirin 325  Dysphagia  Secondary to stroke  CorTrack placed and on tube feedings  Pulled off NG and re-inserted  NPO  Chronic Atrial Fibrillation  Home anticoagulation:  none   Not a good anticoagulation candidate so far secondary to social situation and compliance  On aspirin   Ongoing treatment with Cardizem   Leukocytosis  WBC 17.5-> 20.2-> 13.4  On Zosyn  Repeat in am  Hypertension  Stable Long-term BP goal normotensive  Hyperlipidemia  Hx of HLD but not on home meds  LDL 65, at goal < 70  Tobacco abuse  Current smoker  Smoking cessation counseling provided  Other Stroke Risk Factors  Advanced age  ETOH use, advised to drink no more than 2 drink(s) a day  Marijuana use, UDS not done this admission   Other Active Problems  History non-Hodgkin's lymphoma  History of GI bleeding  History tubular adenoma  History of complete heart block s/p pacer  Lower extremities leg wounds - patient states from insect bite - local pruritis, developing wounds. WOC consulted to evaluate, recommend treatment -> Cleanse left hip wound with normal saline, pat dry. Cover wound bed with Xeroform gauze and secure with foam dressing. Change every other day. (2) Apply protective foam dressing to left malleolus and left knee. Change prn    Hospital day # 11  Rosalin Hawking, MD PhD Stroke Neurology 01/25/2016 6:34 PM      To contact Stroke Continuity provider, please refer to [email or web address] After hours, contact General Neurology

## 2016-01-25 NOTE — Progress Notes (Signed)
Patient restless, pulled off his NG tube. MD notified. We are waiting for Xray to confirm NG tube placement. Will continue to monitor.

## 2016-01-25 NOTE — Care Management Important Message (Signed)
Important Message  Patient Details  Name: [name] MRN: [id number] Date of Birth: 28-Nov-1945   Medicare Important Message Given:  Yes    Nathen May 01/25/2016, 11:39 AM

## 2016-01-25 NOTE — Progress Notes (Signed)
Called MD regarding BP of 190/121. Still waiting for PRN medication. Ruben Gottron, RN 01/25/2016 10:54 PM

## 2016-01-25 NOTE — Progress Notes (Signed)
Speech Language Pathology Treatment: Dysphagia  Patient Details Name: [name] MRN: [id number] DOB: December 01, 1945 Today's Date: 01/25/2016 Time: [id number] SLP Time Calculation (min) (ACUTE ONLY): 16 min  Assessment / Plan / Recommendation Clinical Impression  Pt seen this date for dysphagia followup and readiness for MBSS. Pt was in process of pulling Cortrak when therapist entered room despite mitts and wrist restraints. RN was made aware. PO trials of ice chips were attempted however they elicited an immediate cough response and pt produced a large volume of very thick creamy secretions and ultimately required suction to clear due to inability to fully cough/clear. Pt with limited directive following this date. Will hold MBSS until secretions lessened or more manageable and pt better able to participate.    HPI HPI: 70 yo homeless male with known NHL, CHB with PPM,EF15%, CAF,HTN,GIB, homeless without family support. Pt had scuffle with [employer] and "fell off a bench". Noted weakness on left side and CT ervealed large subacute infarct involving the right ACA and MCA distributions with midline shift. Required intubation 8/12 after having RR 40, CXR with rt pna c/w aspiration and inability to protect airway. Self extubated 8/15. MBS 8/7 revealed silent aspiration with nectar with eventual cough. Dys 1 texture and honey liquids recommended. BSE today for possibility of po meds until objective completed next date.      SLP Plan  MBS (when able to participate)     Recommendations  Diet recommendations: NPO Medication Administration: Via alternative means             Oral Care Recommendations: Oral care BID Follow up Recommendations: Skilled Nursing facility Plan: MBS (when able to participate)     [place], [place] Pager [phone] 01/25/2016, 10:55 AM

## 2016-01-26 ENCOUNTER — Inpatient Hospital Stay (HOSPITAL_COMMUNITY): Payer: Medicare Other

## 2016-01-26 DIAGNOSIS — L8990 Pressure ulcer of unspecified site, unspecified stage: Secondary | ICD-10-CM

## 2016-01-26 DIAGNOSIS — Z515 Encounter for palliative care: Secondary | ICD-10-CM

## 2016-01-26 DIAGNOSIS — I482 Chronic atrial fibrillation: Secondary | ICD-10-CM

## 2016-01-26 LAB — BASIC METABOLIC PANEL
ANION GAP: 8 (ref 5–15)
BUN: 31 mg/dL — ABNORMAL HIGH (ref 6–20)
CALCIUM: 8.6 mg/dL — AB (ref 8.9–10.3)
CO2: 24 mmol/L (ref 22–32)
Chloride: 117 mmol/L — ABNORMAL HIGH (ref 101–111)
Creatinine, Ser: 0.81 mg/dL (ref 0.61–1.24)
GLUCOSE: 92 mg/dL (ref 65–99)
Potassium: 3.2 mmol/L — ABNORMAL LOW (ref 3.5–5.1)
Sodium: 149 mmol/L — ABNORMAL HIGH (ref 135–145)

## 2016-01-26 LAB — GLUCOSE, CAPILLARY
GLUCOSE-CAPILLARY: 102 mg/dL — AB (ref 65–99)
GLUCOSE-CAPILLARY: 105 mg/dL — AB (ref 65–99)
GLUCOSE-CAPILLARY: 163 mg/dL — AB (ref 65–99)
GLUCOSE-CAPILLARY: 90 mg/dL (ref 65–99)
Glucose-Capillary: 102 mg/dL — ABNORMAL HIGH (ref 65–99)
Glucose-Capillary: 72 mg/dL (ref 65–99)
Glucose-Capillary: 94 mg/dL (ref 65–99)

## 2016-01-26 LAB — CBC
HCT: 37.6 % — ABNORMAL LOW (ref 39.0–52.0)
HEMOGLOBIN: 11.3 g/dL — AB (ref 13.0–17.0)
MCH: 29 pg (ref 26.0–34.0)
MCHC: 30.1 g/dL (ref 30.0–36.0)
MCV: 96.4 fL (ref 78.0–100.0)
Platelets: 190 10*3/uL (ref 150–400)
RBC: 3.9 MIL/uL — AB (ref 4.22–5.81)
RDW: 16.4 % — ABNORMAL HIGH (ref 11.5–15.5)
WBC: 11.9 10*3/uL — ABNORMAL HIGH (ref 4.0–10.5)

## 2016-01-26 LAB — CULTURE, RESPIRATORY W GRAM STAIN

## 2016-01-26 LAB — CULTURE, RESPIRATORY

## 2016-01-26 MED ORDER — ONDANSETRON HCL 4 MG/2ML IJ SOLN: 4.0000 mg | Freq: Four times a day (QID) | INTRAMUSCULAR | Status: DC | PRN

## 2016-01-26 MED ORDER — HALOPERIDOL LACTATE 5 MG/ML IJ SOLN
0.5000 mg | INTRAMUSCULAR | Status: DC | PRN
  Administered 2016-01-28: 0.5 mg via INTRAVENOUS
  Filled 2016-01-26: qty 1

## 2016-01-26 MED ORDER — SODIUM CHLORIDE 0.9 % IV SOLN: INTRAVENOUS | Status: DC

## 2016-01-26 MED ORDER — ACETAMINOPHEN 325 MG PO TABS
650.0000 mg | ORAL_TABLET | Freq: Four times a day (QID) | ORAL | Status: DC | PRN
Start: 2016-01-26 — End: 2016-01-29

## 2016-01-26 MED ORDER — BIOTENE DRY MOUTH MT LIQD: 15.0000 mL | OROMUCOSAL | Status: DC | PRN

## 2016-01-26 MED ORDER — ACETAMINOPHEN 650 MG RE SUPP: 650.0000 mg | Freq: Four times a day (QID) | RECTAL | Status: DC | PRN

## 2016-01-26 MED ORDER — POTASSIUM CHLORIDE 10 MEQ/50ML IV SOLN
10.0000 meq | INTRAVENOUS | Status: AC
  Administered 2016-01-26 (×2): 10 meq via INTRAVENOUS
  Filled 2016-01-26 (×2): qty 50

## 2016-01-26 MED ORDER — HALOPERIDOL 0.5 MG PO TABS: 0.5000 mg | ORAL_TABLET | ORAL | Status: DC | PRN

## 2016-01-26 MED ORDER — HALOPERIDOL LACTATE 2 MG/ML PO CONC: 0.5000 mg | ORAL | Status: DC | PRN

## 2016-01-26 MED ORDER — ONDANSETRON 4 MG PO TBDP: 4.0000 mg | ORAL_TABLET | Freq: Four times a day (QID) | ORAL | Status: DC | PRN

## 2016-01-26 MED ORDER — POLYVINYL ALCOHOL 1.4 % OP SOLN: 1.0000 [drp] | Freq: Four times a day (QID) | OPHTHALMIC | Status: DC | PRN

## 2016-01-26 NOTE — Progress Notes (Signed)
Palliative:  Unfortunately [name] continues to not make any improvements. If anything he continues to decline. Now not answering any of my simple questions (even for his own name). Making less eye contact. Has again pulled out NGT used for feeding. He has previously removed NGT and did self extubate. I do believe he would remove PEG if placed as well. I also do not believe that artificial feeding will add to his QOL. Prognosis is very poor. Recommend transition to comfort care and consideration of hospice care. Discussed with Dr. Erlinda Hong and Dr. Domingo Cocking.   Vinie Sill, NP Palliative Medicine Team Pager # [phone] (M-F 8a-5p) Team Phone # [phone] (Nights/Weekends)

## 2016-01-26 NOTE — Progress Notes (Signed)
Physical Therapy Treatment Patient Details Name: [name] MRN: [id number] DOB: 12-Aug-1945 Today's Date: 01/26/2016    History of Present Illness 70 y.o. male admitted to [facility] on 01/14/16 with left sided weakness.  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  Pt with significant PMHx of HTN, GIB, complete heart block, a-fib, and pacemaker.On 01/20/16 pt developed increased WOB, not responding to painful stimuli, stat CT ordered showing worsening midline shift without any hemorrhagic transformation.  Pt was transfered back to the ICU from the stepdown unit and intubated.  Tube feeds stopped.  Pt self extubated on 01/23/16.      PT Comments    Progressing very slowly.  Limited by severe inattention, strong gaze preference to the right.  Emphasized sitting balance, with attempts to draw attention to the left.  Lots of rolling, sidelying and standing for pericare due to leaky rectal tube.   Follow Up Recommendations  SNF     Equipment Recommendations  Wheelchair (measurements PT);Wheelchair cushion (measurements PT);3in1 (PT);Hospital bed    Recommendations for Other Services       Precautions / Restrictions Precautions Precautions: Fall Precaution Comments: L hemi, L inattention/neglect    Mobility  Bed Mobility Overal bed mobility: Needs Assistance Bed Mobility: Rolling;Sidelying to Sit;Sit to Sidelying Rolling: Total assist;Max assist (depending on direction) Sidelying to sit: Max assist;+2 for physical assistance     Sit to sidelying: Max assist;+2 for physical assistance General bed mobility comments: After repetitive movement, pt made small attempts at movement, but still needed significant assist  Transfers Overall transfer level: Needs assistance Equipment used: 1 person hand held assist;2 person hand held assist Transfers: Sit to/from Stand Sit to Stand: +2 physical assistance;Total assist         General transfer comment: Only able to get pt  to full upright standing with face to face transfer technique.  Little assist from pt and extra support needed to attain full upright stance  Ambulation/Gait             General Gait Details: unable at this time   Stairs            Wheelchair Mobility    Modified Rankin (Stroke Patients Only) Modified Rankin (Stroke Patients Only) Pre-Morbid Rankin Score: No symptoms Modified Rankin: Severe disability     Balance Overall balance assessment: Needs assistance Sitting-balance support: Single extremity supported Sitting balance-Leahy Scale: Zero Sitting balance - Comments: mild push to left when not inhibited.  Sat EOB x 10 minutes working on positioning head, tracking to midline and sitting balance..  With fatigue, pt also started falling to the right   Standing balance support: During functional activity Standing balance-Leahy Scale: Zero Standing balance comment: standing x2 for pericare with full body face to face contact for upright support                    Cognition Arousal/Alertness: Lethargic Behavior During Therapy: Restless Overall Cognitive Status: Impaired/Different from baseline Area of Impairment: Orientation;Attention;Memory;Following commands;Safety/judgement;Awareness;Problem solving   Current Attention Level: Focused Memory: Decreased short-term memory Following Commands: Follows one step commands inconsistently;Follows one step commands with increased time Safety/Judgement: Decreased awareness of safety;Decreased awareness of deficits Awareness: Intellectual Problem Solving: Slow processing;Decreased initiation;Difficulty sequencing;Requires verbal cues;Requires tactile cues      Exercises      General Comments        Pertinent Vitals/Pain Pain Assessment: Faces Faces Pain Scale: Hurts little more Pain Location: neck Pain Descriptors /  Indicators: Sore Pain Intervention(s): Repositioned;Monitored during session    Home Living                       Prior Function            PT Goals (current goals can now be found in the care plan section) Acute Rehab PT Goals PT Goal Formulation: Patient unable to participate in goal setting Time For Goal Achievement: 02/07/16 Potential to Achieve Goals: Fair Progress towards PT goals: Progressing toward goals    Frequency  Min 3X/week    PT Plan Current plan remains appropriate    Co-evaluation             End of Session Equipment Utilized During Treatment: Gait belt Activity Tolerance: Patient limited by fatigue Patient left: in bed;with bed alarm set;with restraints reapplied     Time: 1425-1510 PT Time Calculation (min) (ACUTE ONLY): 45 min  Charges:  $Therapeutic Activity: 23-37 mins $Self Care/Home Management: 8-22                    G Codes:      [name], Tessie Fass 01/26/2016, 5:56 PM 01/26/2016  Donnella Sham, PT [phone]  (pager)

## 2016-01-26 NOTE — Progress Notes (Signed)
Patient seen and examined today.   I reviewed Mr. Berland chart and spoke with Vinie Sill from palliative team as well as Dr. Erlinda Hong.  Briefly, he is a 70 year old male with past history of A. fib (not on anticoagulation), status post pacer, non-Hodgkin's lymphoma, GI bleed, hyperlipidemia, and hypertension currently admitted with right MCA infarct and also found to have significantly decreased ejection fraction at 15%.  He has a long history of poor social support and numerous interactions with the health care system where he has refused recommendation for SNF placement and signed out [place].  He self extubated this admission and has also removed multiple prior NG tubes.  He attempts to remove telemetry as well as nasal cannula.  He continues to clinically decline and would not arouse for me today during encounter.  He does not have capacity to make decisions, has severe nutrition with inability to swallow, and has no relatives to active surrogate on his behalf.    I believe he is irreversibly ill and continued aggressive interventions such as PEG tube placement are not indicated as they would potentially add complications (likely him pulling out the tube and causing additional trauma) and prolong suffering without realistic opportunity for any meaningful improvement.  My assessment, based upon his poor prognosis and quality of life without chance of meaningful recovery, is that he would best be served by a focus on comfort and dignity with placement with hospice services if this can be arranged.  Micheline Rough, MD [place] Team [phone]

## 2016-01-26 NOTE — Progress Notes (Signed)
STROKE TEAM PROGRESS NOTE   SUBJECTIVE (INTERVAL HISTORY) Patient again pulled out NG tube today. Palliative care saw, arrived during rounds. Dr. Erlinda Hong discussed plan of care - will not replace NG at this time. Plan continued DNR and SNF placement.    OBJECTIVE Temp:  [97.5 F (36.4 C)-98.2 F (36.8 C)] 97.9 F (36.6 C) (08/18 1019) Pulse Rate:  [54-104] 54 (08/18 1019) Cardiac Rhythm: Atrial fibrillation;Bundle branch block (08/18 0814) Resp:  [16-18] 17 (08/18 1019) BP: (118-190)/(58-128) 146/90 (08/18 1019) SpO2:  [93 %-99 %] 93 % (08/18 1019) Weight:  [45.2 kg (99 lb 10.4 oz)] 45.2 kg (99 lb 10.4 oz) (08/18 0500)  CBC:   Recent Labs Lab 01/20/16 0514  01/24/16 0530 01/26/16 0331  WBC 12.6*  < > 13.4* 11.9*  NEUTROABS 10.5*  --   --   --   HGB 12.0*  < > 11.1* 11.3*  HCT 39.9  < > 37.4* 37.6*  MCV 97.6  < > 99.2 96.4  PLT 190  < > 157 190  < > = values in this interval not displayed.  Basic Metabolic Panel:  Recent Labs Lab 01/24/16 0530  01/25/16 0500 01/25/16 1745 01/26/16 0331  NA 149*  --   --   --  149*  K 4.0  --   --   --  3.2*  CL 119*  --   --   --  117*  CO2 25  --   --   --  24  GLUCOSE 120*  --   --   --  92  BUN 28*  --   --   --  31*  CREATININE 0.83  --   --   --  0.81  CALCIUM 8.5*  --   --   --  8.6*  MG  --   < > 1.9 1.9  --   PHOS  --   < > 2.9 3.4  --   < > = values in this interval not displayed.  Lipid Panel:     Component Value Date/Time   CHOL 104 01/15/2016 0405   TRIG 46 01/15/2016 0405   HDL 31 (L) 01/15/2016 0405   CHOLHDL 3.4 01/15/2016 0405   VLDL 9 01/15/2016 0405   LDLCALC 64 01/15/2016 0405   HgbA1c:  Lab Results  Component Value Date   HGBA1C 6.7 (H) 01/15/2016    IMAGING  Ct Head Wo Contrast  01/26/2016 1. Large right MCA and ACA infarcts with interval regressed edema and intracranial mass effect. Decreased leftward midline shift, now 7 mm. 2. Petechial hemorrhage without malignant hemorrhagic transformation.  3. Stable mass effect on the right lateral ventricle and minimal to mild enlargement of the left lateral ventricle. 4. No new intracranial abnormality. Chronic posterior left hemisphere infarct.  01/22/2016 Little change in appearance from the most recent study in the large subacute infarct involving the right ACA and MCA distributions. The degree of midline shift is unchanged, although there is slightly more dilatation of the left lateral ventricle.  01/20/2016 Right ACA and MCA infarct similar distribution to the prior study. There is no acute hemorrhage. There is increased edema and mass-effect with increased midline shift. Progressive obstruction of the left lateral ventricle.   01/17/2016 1. Large evolving subacute right MCA territory infarct, with associated smaller right ACA territory infarct. No evidence for hemorrhagic transformation. 2. Worsened localized edema with increased 11 mm of right-to-left shift. No hydrocephalus. Slightly worsened asymmetric dilatation of the left lateral ventricle concerning  for possible developing ventricular trapping.   01/14/2016 Hyperdense right MCA indicative of thrombus, with evidence of subacute ischemia in the right MCA distribution,   Ct Angio Head and neck W Or Wo Contrast 01/15/2016 1. 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 Nonhemorrhagic acute infarct throughout the right MCA territory and in the distal right ACA territory. 2. Remote left parietal occipital infarct. 3. Extensive debris in the central airways. Correlate for symptoms of aspiration.   Ct Cervical Spine Wo Contrast 01/14/2016  1. Hyperdense right MCA indicative of thrombus, with evidence of subacute ischemia in the right MCA distribution, as above. 2. No evidence of significant acute traumatic injury to the facial bones or cervical spine. Critical Value/emergent results were called by telephone at the time of interpretation on 01/14/2016 at 4:34 pm to Dr. Shon Hale, who  verbally acknowledged these results. Electronically Signed   By: Vinnie Langton M.D.   On: 01/14/2016 16:54   Ct Chest W Contrast Ct Abdomen Pelvis W Contrast 01/14/2016 1. Filling defect in the apex of the left ventricle highly concerning for thrombus. This may suggest an embolic source as a cause of the patient's right MCA occlusion. This was discussed by telephone at the time of interpretation on 01/14/2016 at 5:02 pm to Dr. Shon Hale, who verbally acknowledged these results. 2. No evidence of significant acute traumatic injury to the chest, abdomen or pelvis. 3. The appearance of the lungs suggests mild congestive heart failure, with mild interstitial pulmonary edema and trace bilateral pleural effusions. 4. 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 Whether this is chronic (favored), or sequela of large embolic event is uncertain. 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 Celiac axis and SMA are both patent. 5. Area in the spleen which is likely related to prior splenic infarct, suggesting prior embolic event. 6. Cardiomegaly with left ventricular and left atrial dilatation. 7. Aortic atherosclerosis, in addition to 3 vessel coronary artery disease. Please note that although the presence of coronary artery calcium documents the presence of coronary artery disease, the severity of this disease and any potential stenosis cannot be assessed on this non-gated CT examination. Assessment for potential risk factor modification, dietary therapy or pharmacologic therapy may be warranted, if clinically indicated. 8. Additional incidental findings, as above.   Dg Chest Port 1 View 01/24/2016 Endotracheal tube has been removed. Stable left IJ central line position. Streaky right basilar atelectasis or infiltrate. No pneumothorax. No pulmonary edema. 01/22/2016 1.  Stable lines and tubes. 2. Pulmonary hyperinflation.  No acute  cardiopulmonary abnormality.  01/21/2016 No acute abnormalities.  01/20/2016 Endotracheal tube placed Right basilar pneumonia has improved.  01/20/2016 Increased right mid lower lung airspace opacities which may represent aspiration or infection. Enteric tube courses inferior to the diaphragm. Left IJ central venous catheter tip projects over the superior vena cava.  01/15/2016 Left IJ central line in place.  No pneumothorax.   2-D echocardiogram - Left ventricle: The cavity size was mildly dilated. Wall   thickness was normal. Systolic function was severely reduced. The estimated ejection fraction was 15%. Diffuse hypokinesis.  - Aortic valve: There was mild regurgitation. - Mitral valve: Calcified annulus. There was moderate regurgitation. - Left atrium: The atrium was severely dilated. - Right atrium: The atrium was mildly dilated. Impressions:   - Severe global reduction in LV function; mild AI; moderate MR;  biatrial enlargement; mild TR.  EEG -  1. Mild diffuse  slowing of the waking background. 2. Focal slowing over the left hemisphere. There were no epileptiform discharges or electrographic seizures seen.   PHYSICAL EXAM  Temp:  [97.5 F (36.4 C)-98.2 F (36.8 C)] 97.9 F (36.6 C) (08/18 1019) Pulse Rate:  [54-104] 54 (08/18 1019) Resp:  [16-18] 17 (08/18 1019) BP: (118-190)/(58-128) 146/90 (08/18 1019) SpO2:  [93 %-99 %] 93 % (08/18 1019) Weight:  [45.2 kg (99 lb 10.4 oz)] 45.2 kg (99 lb 10.4 oz) (08/18 0500)  General - severely malnourished with lack of muscle mass  Ophthalmologic - Fundi not visualized due to noncooperation.  Cardiovascular - irregularly irregular heart rate and rhythm.  Neuro - sleepy but arousable, following peripheral commands on the right. NG tube in place, PERRL, R gaze preference but able to cross midline. Positive corneal and gag. Against forced eye opening. Right upper extremity and lower extremity spontaneous movement, purposeful, and strong.  Left upper extremity 0/5, left lower extremity proximal 3-/5 and distal 0/5. Muscle tone decreased on the left, DTR 1+, Babinski negative. Sensation, coordination, and gait not tested.   ASSESSMENT/PLAN [name] is a 70 y.o. male with history of AFib not on anticoagulation, S/P pacer, non-hodgkins lymphoma, GIB with tubular adenoma, HLD, and HTN presenting after a fall off a park bench with L sided weakness. He did not receive IV t-PA or IR due to high ASPECTS score.   Stroke:  Right MCA infarct in setting of hyperdense R MCA, felt to be embolic secondary to known atrial fibrillation not on anticoagulation vs cardiomyopathy (EF 15%)  Resultant  left hemiplegia, VDRF  CTA head and neck distal right ICA thrombus extending into right M1 and A1 segments. right MCA and right ACA territory infarct. Old left parietal occipital infarct.  MRI  / MRA  Pacemaker  CT repeat x 3 increasing edema and left lateral ventricle hydrocephalus  Repeat CT today shows resolving edema  2D Echo  EF 15%. No source of embolus. Diffuse hypokinesis.  EEG no definite seizure activity, focal slowing on the left. Mildly diffuse slow.  LDL 64  HgbA1c 6.7  SCDs for VTE prophylaxis Diet NPO time specified  aspirin 81 mg daily prior to admission, now on aspirin 300 mg suppository daily.   Therapy recommendations:  SNF  Disposition:  pending (homeless. recently discharged from jail, no family, reportedly has a girlfriend)  Deposition   Neuro and respiratory worsening 01/20/2016 - decreased level of reponsivness, tachycardia, tachypnea, fever, and chest congestion.   Repeat CT increasing edema, midline shift, and left lateral ventricle hydrocephalus  Poor neurologic prognosis, given large right MCA stroke, hydrocephalus, cerebral edema, PTA lymphoma, lack of social support  Agreeable to DO NOT RESUSCITATE   Self extubated 8/15  Pulled off NG tube 8/17 - re-inserted, then pulled out again  Ethic  committee defers care decision to 2 MDs which agree  Dr. Erlinda Hong to discuss plan of care with Palliative Care MD today - DNR, do not replace PEG, SNF placement  Right lower lobe aspiration pneumonia resolved  Intubated 01/20/2016 for respiratory distress that occurred following ativan dosing  CCM on board  Self extubated 8/15  vanco d/c'ed  On Zosyn - completed 7 day course  Repeat CXR resolved RLL pneumonia   Cerebral edema, induced hypernatremia  Treated with 3% saline, now off  Na 153->154->149  Will let Na drift down  Obstructive hydrocephalus  Increasing left lateral ventricle hydrocephalus  Not a good candidate for EVD placement  Continue observation  Repeat CT  shows decreasing edema/hydrocephalus  Cardiomyopathy  EF 15%  Pacer in place  Not good candidate for anticoagulation due to social condition and noncompliance  On aspirin 325  Dysphagia  Secondary to stroke  CorTrack placed and on tube feedings  Pulled off NG and re-inserted  NPO  Chronic Atrial Fibrillation  Home anticoagulation:  none   Not a good anticoagulation candidate so far secondary to social situation and compliance  On aspirin   Ongoing treatment with Cardizem   Leukocytosis  WBC 17.5-> 20.2-> 13.4->11.9  Treated with 7 day Zosyn course  Hypertension  Stable Long-term BP goal normotensive  Hyperlipidemia  Hx of HLD but not on home meds  LDL 65, at goal < 70  Tobacco abuse  Current smoker  Smoking cessation counseling provided  Other Stroke Risk Factors  Advanced age  ETOH use, advised to drink no more than 2 drink(s) a day  Marijuana use, UDS not done this admission   Other Active Problems  History non-Hodgkin's lymphoma  History of GI bleeding  History tubular adenoma  History of complete heart block s/p pacer  Lower extremities leg wounds - patient states from insect bite - local pruritis, developing wounds. WOC consulted to evaluate,  recommend treatment -> Cleanse left hip wound with normal saline, pat dry. Cover wound bed with Xeroform gauze and secure with foam dressing. Change every other day. (2) Apply protective foam dressing to left malleolus and left knee. Change prn    Hospital day # [place] for Pager information 01/26/2016 2:34 PM     To contact Stroke Continuity provider, please refer to [email or web address] After hours, contact General Neurology

## 2016-01-26 NOTE — Progress Notes (Signed)
SLP Cancellation Note  Patient Details Name: [name] MRN: [id number] DOB: [birth year]   Cancelled treatment:       Reason Eval/Treat Not Completed: Fatigue/lethargy limiting ability to participate; pt not sufficiently arousable for PO trials.  Will continue attempts and follow along for plan.    [name] 01/26/2016, 11:13 AM

## 2016-01-26 NOTE — Progress Notes (Signed)
Pt unable to take oral meds today on dayshift due to no Tube Cortrax tube in place. [place] MD notified. No new orders received. Will continue to monitor.

## 2016-01-27 MED ORDER — SODIUM CHLORIDE 0.9% FLUSH
10.0000 mL | INTRAVENOUS | Status: DC | PRN
Start: 2016-01-27 — End: 2016-01-29
  Administered 2016-01-27 (×2): 10 mL
  Administered 2016-01-27 – 2016-01-28 (×3): 20 mL
  Filled 2016-01-27 (×5): qty 40

## 2016-01-27 NOTE — Progress Notes (Signed)
I checked on [name] this afternoon.  No prn usage noted.  He is lying quietly in bed.  No distress. Appears comfortable.  Comfort care. Will continue to follow for symptom management.  Micheline Rough, MD [place] Team [phone]

## 2016-01-27 NOTE — Progress Notes (Signed)
Speech Language Pathology Treatment: Dysphagia  Patient Details Name: [name] MRN: [id number] DOB: 1946/01/28 Today's Date: 01/27/2016 Time: [id number] SLP Time Calculation (min) (ACUTE ONLY): 16 min  Assessment / Plan / Recommendation Clinical Impression  Reviewed chart and noted transition to comfort care. RN requested SLP assistance in determining potential for pleasure pos. Patient alert but with limited participation in today's treatment session. Oral care provided prior to po trials to facilitate safest intake possible in patient with known severe dysphagia and h/o recent aspiration. Patient nodding yes and no in response to questions in approximately 25% of attempts. Nodded head yes in response to ice chips. Ice chips provided with little oral manipulation of bolus, one swallow response out of 4 trials however patient appearing to take pleasure in intake. Aspiration risk remains high with any pos however would consider ice chips for pleasure after mouth care PRN. MD, please order if agree. Will f/u briefly.    HPI HPI: 70 yo homeless male with known NHL, CHB with PPM,EF15%, CAF,HTN,GIB, homeless without family support. Pt had scuffle with [employer] and "fell off a bench". Noted weakness on left side and CT ervealed large subacute infarct involving the right ACA and MCA distributions with midline shift. Required intubation 8/12 after having RR 40, CXR with rt pna c/w aspiration and inability to protect airway. Self extubated 8/15. MBS 8/7 revealed silent aspiration with nectar with eventual cough. Dys 1 texture and honey liquids recommended. BSE today for possibility of po meds until objective completed next date.      SLP Plan  Continue with current plan of care     Recommendations  Diet recommendations: Other(comment) (consider ice chips after oral care for pleasure) Medication Administration: Via alternative means             Oral Care Recommendations: Oral care QID;Oral care prior to  ice chip/H20 Follow up Recommendations: Skilled Nursing facility Plan: Continue with current plan of care     [place], [place] [phone]   [place] 01/27/2016, 11:29 AM

## 2016-01-27 NOTE — Progress Notes (Signed)
Pt had some V-tach, MD notified. He advised to just watch it as pt is comfort care. Will continue to monitor.

## 2016-01-27 NOTE — Progress Notes (Signed)
STROKE TEAM PROGRESS NOTE    HPI: This is a 70-yo man who was in jail last night and was released this afternoon. By report, he was released this afternoon. Per ED RN, EMS reported that he got into a "scuffle" with officers at the jail. They reportedly carried him out to a bench. He fell off of the bench and EMS was activated. They arrived to find the patient with left-sided weakness and abrasions over his face. He was initially thought to be a trauma but on arrival to ED the attending called [place]. The patient is not complaining of anything at this time apart from wanting something to eat. No further information is available at this time.   Last seen normal: 1430 NIHSS score: 19 tPA given: No, subacute stroke seen on CT, ASPECTS score 2    SUBJECTIVE (INTERVAL HISTORY) No family or associates at bedside.  Resting peacefully at this time.     OBJECTIVE Temp:  [98.5 F (36.9 C)-99 F (37.2 C)] 98.5 F (36.9 C) (08/19 1211) Pulse Rate:  [60-106] 106 (08/19 1211) Cardiac Rhythm: Atrial fibrillation;Bundle branch block (08/18 2001) Resp:  [18-35] 35 (08/19 1211) BP: (128-147)/(79-92) 147/79 (08/19 1211) SpO2:  [86 %-99 %] 86 % (08/19 1211)  CBC:   Recent Labs Lab 01/24/16 0530 01/26/16 0331  WBC 13.4* 11.9*  HGB 11.1* 11.3*  HCT 37.4* 37.6*  MCV 99.2 96.4  PLT 157 [id number]    Basic Metabolic Panel:  Recent Labs Lab 01/24/16 0530  01/25/16 0500 01/25/16 1745 01/26/16 0331  NA 149*  --   --   --  149*  K 4.0  --   --   --  3.2*  CL 119*  --   --   --  117*  CO2 25  --   --   --  24  GLUCOSE 120*  --   --   --  92  BUN 28*  --   --   --  31*  CREATININE 0.83  --   --   --  0.81  CALCIUM 8.5*  --   --   --  8.6*  MG  --   < > 1.9 1.9  --   PHOS  --   < > 2.9 3.4  --   < > = values in this interval not displayed.  Lipid Panel:     Component Value Date/Time   CHOL 104 01/15/2016 0405   TRIG 46 01/15/2016 0405   HDL 31 (L) 01/15/2016 0405   CHOLHDL 3.4  01/15/2016 0405   VLDL 9 01/15/2016 0405   LDLCALC 64 01/15/2016 0405   HgbA1c:  Lab Results  Component Value Date   HGBA1C 6.7 (H) 01/15/2016    IMAGING Ct Head Wo Contrast 01/26/2016 1. Large right MCA and ACA infarcts with interval regressed edema and intracranial mass effect. Decreased leftward  midline shift, now 7 mm.  2. Petechial hemorrhage without malignant hemorrhagic transformation.  3. Stable mass effect on the right lateral ventricle and minimal to mild enlargement of the left lateral ventricle.  4. No new intracranial abnormality. Chronic posterior left hemisphere infarct.   01/22/2016 Little change in appearance from the most recent study in the large subacute infarct involving the right ACA and MCA distributions. The degree of midline shift is unchanged, although there is slightly more dilatation of the left lateral ventricle.   01/20/2016 Right ACA and MCA infarct similar distribution to the prior study. There is no acute hemorrhage.  There is increased edema and mass-effect with increased midline shift. Progressive obstruction of the left lateral ventricle.   01/17/2016 1. Large evolving subacute right MCA territory infarct, with associated smaller right ACA territory infarct. No evidence for hemorrhagic transformation. 2. Worsened localized edema with increased 11 mm of right-to-left shift. No hydrocephalus. Slightly worsened asymmetric dilatation of the left lateral ventricle concerning for possible developing ventricular trapping.   01/14/2016 Hyperdense right MCA indicative of thrombus, with evidence of subacute ischemia in the right MCA distribution,   Ct Angio Head and neck W Or Wo Contrast 01/15/2016 1. 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 Nonhemorrhagic acute infarct throughout the right MCA territory and in the distal right ACA territory. 2. Remote left parietal occipital infarct. 3. Extensive debris in the central airways. Correlate  for symptoms of aspiration.   Ct Cervical Spine Wo Contrast 01/14/2016  1. Hyperdense right MCA indicative of thrombus, with evidence of subacute ischemia in the right MCA distribution, as above. 2. No evidence of significant acute traumatic injury to the facial bones or cervical spine. Critical Value/emergent results were called by telephone at the time of interpretation on 01/14/2016 at 4:34 pm to Dr. Shon Hale, who verbally acknowledged these results. Electronically Signed   By: Vinnie Langton M.D.   On: 01/14/2016 16:54   Ct Chest W Contrast Ct Abdomen Pelvis W Contrast 01/14/2016 1. Filling defect in the apex of the left ventricle highly concerning for thrombus. This may suggest an embolic source as a cause of the patient's right MCA occlusion. This was discussed by telephone at the time of interpretation on 01/14/2016 at 5:02 pm to Dr. Shon Hale, who verbally acknowledged these results. 2. No evidence of significant acute traumatic injury to the chest, abdomen or pelvis. 3. The appearance of the lungs suggests mild congestive heart failure, with mild interstitial pulmonary edema and trace bilateral pleural effusions. 4. 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 Whether this is chronic (favored), or sequela of large embolic event is uncertain. 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 Celiac axis and SMA are both patent. 5. Area in the spleen which is likely related to prior splenic infarct, suggesting prior embolic event. 6. Cardiomegaly with left ventricular and left atrial dilatation. 7. Aortic atherosclerosis, in addition to 3 vessel coronary artery disease. Please note that although the presence of coronary artery calcium documents the presence of coronary artery disease, the severity of this disease and any potential stenosis cannot be assessed on this non-gated CT examination. Assessment for potential risk factor  modification, dietary therapy or pharmacologic therapy may be warranted, if clinically indicated. 8. Additional incidental findings, as above.   Dg Chest Port 1 View 01/24/2016 Endotracheal tube has been removed. Stable left IJ central line position. Streaky right basilar atelectasis or infiltrate. No pneumothorax. No pulmonary edema. 01/22/2016 1.  Stable lines and tubes. 2. Pulmonary hyperinflation.  No acute cardiopulmonary abnormality.  01/21/2016 No acute abnormalities.  01/20/2016 Endotracheal tube placed Right basilar pneumonia has improved.  01/20/2016 Increased right mid lower lung airspace opacities which may represent aspiration or infection. Enteric tube courses inferior to the diaphragm. Left IJ central venous catheter tip projects over the superior vena cava.  01/15/2016 Left IJ central line in place.  No pneumothorax.   2-D echocardiogram - Left ventricle: The cavity size was mildly dilated. Wall   thickness was normal. Systolic function was severely reduced. The estimated ejection fraction  was 15%. Diffuse hypokinesis.  - Aortic valve: There was mild regurgitation. - Mitral valve: Calcified annulus. There was moderate regurgitation. - Left atrium: The atrium was severely dilated. - Right atrium: The atrium was mildly dilated. Impressions:   - Severe global reduction in LV function; mild AI; moderate MR;  biatrial enlargement; mild TR.  EEG -  1. Mild diffuse slowing of the waking background. 2. Focal slowing over the left hemisphere. There were no epileptiform discharges or electrographic seizures seen.   PHYSICAL EXAM  Temp:  [98.5 F (36.9 C)-99 F (37.2 C)] 98.5 F (36.9 C) (08/19 1211) Pulse Rate:  [60-106] 106 (08/19 1211) Resp:  [18-35] 35 (08/19 1211) BP: (128-147)/(79-92) 147/79 (08/19 1211) SpO2:  [86 %-99 %] 86 % (08/19 1211)  General - severely malnourished with lack of muscle mass  Cardiovascular - irregularly irregular heart rate and  rhythm.  Pulmonary:  Scattered rhonchi  Abd:  Flat, quiet bowel sounds  Extremities:  Multiple areas of skins changes left leg greater than right and ecchymosis on arms  Neuro - sleepy and poorly arousable.  Did not follow commands. PERRL, R gaze preference but able to cross midline. Positive corneal and gag. Right upper extremity and lower extremity spontaneous movement, purposeful, and strong. Left upper extremity 0/5, left lower extremity proximal 3-/5 and distal 0/5. Muscle tone decreased on the left, DTR 1+, Babinski negative. Sensation, coordination, and gait not tested.    ASSESSMENT/PLAN Mr. Desire Pedder is a 70 y.o. male with history of AFib not on anticoagulation, S/P pacer, non-hodgkins lymphoma, GIB with tubular adenoma, HLD, and HTN presenting after a fall off a park bench with L sided weakness. He did not receive IV t-PA or IR due to high ASPECTS score.   Stroke:  Right MCA infarct in setting of hyperdense R MCA, felt to be embolic secondary to known atrial fibrillation not on anticoagulation vs cardiomyopathy (EF 15%)  Resultant  left hemiplegia, VDRF  CTA head and neck distal right ICA thrombus extending into right M1 and A1 segments. right MCA and right ACA territory infarct. Old left parietal occipital infarct.  MRI  / MRA  Pacemaker  CT repeat x 3 increasing edema and left lateral ventricle hydrocephalus  Repeat CT today shows resolving edema  2D Echo  EF 15%. No source of embolus. Diffuse hypokinesis.  EEG no definite seizure activity, focal slowing on the left. Mildly diffuse slow.  LDL 64  HgbA1c 6.7  SCDs for VTE prophylaxis Diet NPO time specified  aspirin 81 mg daily prior to admission, now on aspirin 300 mg suppository daily.   Therapy recommendations:  SNF  Disposition:  pending (homeless. recently discharged from jail, no family, reportedly has a girlfriend)  Deposition   Neuro and respiratory worsening 01/20/2016 - decreased level of  reponsivness, tachycardia, tachypnea, fever, and chest congestion.   Repeat CT increasing edema, midline shift, and left lateral ventricle hydrocephalus  Poor neurologic prognosis, given large right MCA stroke, hydrocephalus, cerebral edema, PTA lymphoma, lack of social support  Agreeable to DO NOT RESUSCITATE   Self extubated 8/15  Pulled off NG tube 8/17 - re-inserted, then pulled out again  Ethic committee defers care decision to 2 MDs which agree  Dr. Erlinda Hong to discuss plan of care with Palliative Care MD today - DNR, do not replace PEG, SNF placement  Right lower lobe aspiration pneumonia resolved  Intubated 01/20/2016 for respiratory distress that occurred following ativan dosing  CCM on board  Self extubated 8/15  vanco  d/c'ed  On Zosyn - completed 7 day course (Saturday is day #8)  Repeat CXR resolved RLL pneumonia   Cerebral edema, induced hypernatremia  Treated with 3% saline, now off  Na 153->154->149  Will let Na drift down  Obstructive hydrocephalus  Increasing left lateral ventricle hydrocephalus  Not a good candidate for EVD placement  Continue observation  Repeat CT 01/26/2016 shows decreasing edema/hydrocephalus  Cardiomyopathy  EF 15%  Pacer in place  Not good candidate for anticoagulation due to social condition and noncompliance  On aspirin 325  Dysphagia  Secondary to stroke  CorTrack placed and on tube feedings  Pulled off NG and re-inserted  NPO  Chronic Atrial Fibrillation  Home anticoagulation:  none   Not a good anticoagulation candidate so far secondary to social situation and compliance  On aspirin   Ongoing treatment with Cardizem   Leukocytosis  WBC 17.5-> 20.2-> 13.4->11.9  Treated with 7 day Zosyn course  Hypertension  Stable Long-term BP goal normotensive  Hyperlipidemia  Hx of HLD but not on home meds  LDL 65, at goal < 70  Tobacco abuse  Current smoker  Smoking cessation counseling  provided  Other Stroke Risk Factors  Advanced age  ETOH use, advised to drink no more than 2 drink(s) a day  Marijuana use, UDS not done this admission   Other Active Problems  History non-Hodgkin's lymphoma  History of GI bleeding  History tubular adenoma  History of complete heart block s/p pacer  Lower extremities leg wounds - patient states from insect bite - local pruritis, developing wounds. WOC consulted to evaluate, recommend treatment -> Cleanse left hip wound with normal saline, pat dry. Cover wound bed with Xeroform gauze and secure with foam dressing. Change every other day. (2) Apply protective foam dressing to left malleolus and left knee. Change prn  01/31/2016 court hearing regarding comfort care measures.  Hypokalemia - 3.2  BUN 31    Hospital day # 13  ATTENDING NOTE: Patient was seen and examined by me personally. Documentation reflects findings. The laboratory and radiographic studies reviewed by me. ROS pertinent positives could not be fully documented due to LOC  Condition: unchanged medically; decreased LOC neurologically.  Comfort Care only  Assessment and plan completed by me personally and fully documented above. Plans/Recommendations include:     01/31/2016 court hearing  SIGNED BY: Dr. Elissa Hefty      To contact Stroke Continuity provider, please refer to [email or web address] After hours, contact General Neurology

## 2016-01-28 MED ORDER — WHITE PETROLATUM GEL
Status: AC
  Administered 2016-01-28: 13:00:00
  Filled 2016-01-28: qty 1

## 2016-01-28 NOTE — Progress Notes (Signed)
STROKE TEAM PROGRESS NOTE    HPI: This is a 70-yo man who was in jail last night and was released this afternoon. By report, he was released this afternoon. Per ED RN, EMS reported that he got into a "scuffle" with officers at the jail. They reportedly carried him out to a bench. He fell off of the bench and EMS was activated. They arrived to find the patient with left-sided weakness and abrasions over his face. He was initially thought to be a trauma but on arrival to ED the attending called [facility]. The patient is not complaining of anything at this time apart from wanting something to eat. No further information is available at this time.   Last seen normal: 1430 NIHSS score: 19 tPA given: No, subacute stroke seen on CT, ASPECTS score 2    SUBJECTIVE (INTERVAL HISTORY) No family or associates at bedside.  Resting peacefully at this time.     OBJECTIVE Temp:  [97.5 F (36.4 C)-98.5 F (36.9 C)] 97.5 F (36.4 C) (08/20 0606) Pulse Rate:  [55-106] 86 (08/20 0606) Resp:  [20-35] 20 (08/20 0606) BP: (147-158)/(60-87) 152/87 (08/20 0606) SpO2:  [86 %-96 %] 95 % (08/20 0606)  CBC:   Recent Labs Lab 01/24/16 0530 01/26/16 0331  WBC 13.4* 11.9*  HGB 11.1* 11.3*  HCT 37.4* 37.6*  MCV 99.2 96.4  PLT 157 [id number]    Basic Metabolic Panel:  Recent Labs Lab 01/24/16 0530  01/25/16 0500 01/25/16 1745 01/26/16 0331  NA 149*  --   --   --  149*  K 4.0  --   --   --  3.2*  CL 119*  --   --   --  117*  CO2 25  --   --   --  24  GLUCOSE 120*  --   --   --  92  BUN 28*  --   --   --  31*  CREATININE 0.83  --   --   --  0.81  CALCIUM 8.5*  --   --   --  8.6*  MG  --   < > 1.9 1.9  --   PHOS  --   < > 2.9 3.4  --   < > = values in this interval not displayed.  Lipid Panel:     Component Value Date/Time   CHOL 104 01/15/2016 0405   TRIG 46 01/15/2016 0405   HDL 31 (L) 01/15/2016 0405   CHOLHDL 3.4 01/15/2016 0405   VLDL 9 01/15/2016 0405   LDLCALC 64 01/15/2016 0405    HgbA1c:  Lab Results  Component Value Date   HGBA1C 6.7 (H) 01/15/2016    IMAGING Ct Head Wo Contrast 01/26/2016 1. Large right MCA and ACA infarcts with interval regressed edema and intracranial mass effect. Decreased leftward  midline shift, now 7 mm.  2. Petechial hemorrhage without malignant hemorrhagic transformation.  3. Stable mass effect on the right lateral ventricle and minimal to mild enlargement of the left lateral ventricle.  4. No new intracranial abnormality. Chronic posterior left hemisphere infarct.   01/22/2016 Little change in appearance from the most recent study in the large subacute infarct involving the right ACA and MCA distributions. The degree of midline shift is unchanged, although there is slightly more dilatation of the left lateral ventricle.   01/20/2016 Right ACA and MCA infarct similar distribution to the prior study. There is no acute hemorrhage. There is increased edema and mass-effect with increased  midline shift. Progressive obstruction of the left lateral ventricle.   01/17/2016 1. Large evolving subacute right MCA territory infarct, with associated smaller right ACA territory infarct. No evidence for hemorrhagic transformation. 2. Worsened localized edema with increased 11 mm of right-to-left shift. No hydrocephalus. Slightly worsened asymmetric dilatation of the left lateral ventricle concerning for possible developing ventricular trapping.   01/14/2016 Hyperdense right MCA indicative of thrombus, with evidence of subacute ischemia in the right MCA distribution,   Ct Angio Head and neck W Or Wo Contrast 01/15/2016 1. 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 Nonhemorrhagic acute infarct throughout the right MCA territory and in the distal right ACA territory. 2. Remote left parietal occipital infarct. 3. Extensive debris in the central airways. Correlate for symptoms of aspiration.   Ct Cervical Spine Wo Contrast 01/14/2016   1. Hyperdense right MCA indicative of thrombus, with evidence of subacute ischemia in the right MCA distribution, as above. 2. No evidence of significant acute traumatic injury to the facial bones or cervical spine. Critical Value/emergent results were called by telephone at the time of interpretation on 01/14/2016 at 4:34 pm to Dr. Shon Hale, who verbally acknowledged these results. Electronically Signed   By: Vinnie Langton M.D.   On: 01/14/2016 16:54   Ct Chest W Contrast Ct Abdomen Pelvis W Contrast 01/14/2016 1. Filling defect in the apex of the left ventricle highly concerning for thrombus. This may suggest an embolic source as a cause of the patient's right MCA occlusion. This was discussed by telephone at the time of interpretation on 01/14/2016 at 5:02 pm to Dr. Shon Hale, who verbally acknowledged these results. 2. No evidence of significant acute traumatic injury to the chest, abdomen or pelvis. 3. The appearance of the lungs suggests mild congestive heart failure, with mild interstitial pulmonary edema and trace bilateral pleural effusions. 4. 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 Whether this is chronic (favored), or sequela of large embolic event is uncertain. 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 Celiac axis and SMA are both patent. 5. Area in the spleen which is likely related to prior splenic infarct, suggesting prior embolic event. 6. Cardiomegaly with left ventricular and left atrial dilatation. 7. Aortic atherosclerosis, in addition to 3 vessel coronary artery disease. Please note that although the presence of coronary artery calcium documents the presence of coronary artery disease, the severity of this disease and any potential stenosis cannot be assessed on this non-gated CT examination. Assessment for potential risk factor modification, dietary therapy or pharmacologic therapy may be warranted, if clinically  indicated. 8. Additional incidental findings, as above.   Dg Chest Port 1 View 01/24/2016 Endotracheal tube has been removed. Stable left IJ central line position. Streaky right basilar atelectasis or infiltrate. No pneumothorax. No pulmonary edema. 01/22/2016 1.  Stable lines and tubes. 2. Pulmonary hyperinflation.  No acute cardiopulmonary abnormality.  01/21/2016 No acute abnormalities.  01/20/2016 Endotracheal tube placed Right basilar pneumonia has improved.  01/20/2016 Increased right mid lower lung airspace opacities which may represent aspiration or infection. Enteric tube courses inferior to the diaphragm. Left IJ central venous catheter tip projects over the superior vena cava.  01/15/2016 Left IJ central line in place.  No pneumothorax.   2-D echocardiogram - Left ventricle: The cavity size was mildly dilated. Wall   thickness was normal. Systolic function was severely reduced. The estimated ejection fraction was 15%. Diffuse hypokinesis.  - Aortic valve:  There was mild regurgitation. - Mitral valve: Calcified annulus. There was moderate regurgitation. - Left atrium: The atrium was severely dilated. - Right atrium: The atrium was mildly dilated. Impressions:   - Severe global reduction in LV function; mild AI; moderate MR;  biatrial enlargement; mild TR.  EEG -  1. Mild diffuse slowing of the waking background. 2. Focal slowing over the left hemisphere. There were no epileptiform discharges or electrographic seizures seen.   PHYSICAL EXAM  Temp:  [97.5 F (36.4 C)-98.5 F (36.9 C)] 97.5 F (36.4 C) (08/20 0606) Pulse Rate:  [55-106] 86 (08/20 0606) Resp:  [20-35] 20 (08/20 0606) BP: (147-158)/(60-87) 152/87 (08/20 0606) SpO2:  [86 %-96 %] 95 % (08/20 0606)  General - severely malnourished with lack of muscle mass  Cardiovascular - irregularly irregular heart rate and rhythm.  Pulmonary:  Scattered rhonchi  Abd:  Flat, quiet bowel sounds  Extremities:  Multiple  areas of skins changes left leg greater than right and ecchymosis on arms  Neuro - sleepy and poorly arousable.  Did attempt to vocalize but unintelligible.  Did not follow commands. PERRL, R gaze preference but able to cross midline. Positive corneal and gag. Right upper extremity and lower extremity spontaneous movement, purposeful, and strong. Left upper extremity 0/5, left lower extremity proximal 3-/5 and distal 0/5. Muscle tone decreased on the left, DTR 1+, Babinski negative. Sensation, coordination, and gait not tested.    ASSESSMENT/PLAN Mr. Kaimana Parker is a 70 y.o. male with history of AFib not on anticoagulation, S/P pacer, non-hodgkins lymphoma, GIB with tubular adenoma, HLD, and HTN presenting after a fall off a park bench with L sided weakness. He did not receive IV t-PA or IR due to high ASPECTS score.   Stroke:  Right MCA infarct in setting of hyperdense R MCA, felt to be embolic secondary to known atrial fibrillation not on anticoagulation vs cardiomyopathy (EF 15%)  Resultant  left hemiplegia, VDRF  CTA head and neck distal right ICA thrombus extending into right M1 and A1 segments. right MCA and right ACA territory infarct. Old left parietal occipital infarct.  MRI  / MRA  Pacemaker  CT repeat x 3 increasing edema and left lateral ventricle hydrocephalus  Repeat CT today shows resolving edema  2D Echo  EF 15%. No source of embolus. Diffuse hypokinesis.  EEG no definite seizure activity, focal slowing on the left. Mildly diffuse slow.  LDL 64  HgbA1c 6.7  SCDs for VTE prophylaxis Diet NPO time specified  aspirin 81 mg daily prior to admission, now on aspirin 300 mg suppository daily.   Therapy recommendations:  SNF  Disposition:  pending (homeless. recently discharged from jail, no family, reportedly has a girlfriend)  Deposition   Neuro and respiratory worsening 01/20/2016 - decreased level of reponsivness, tachycardia, tachypnea, fever, and chest  congestion.   Repeat CT increasing edema, midline shift, and left lateral ventricle hydrocephalus  Poor neurologic prognosis, given large right MCA stroke, hydrocephalus, cerebral edema, PTA lymphoma, lack of social support  Agreeable to DO NOT RESUSCITATE   Self extubated 8/15  Pulled off NG tube 8/17 - re-inserted, then pulled out again  Ethic committee defers care decision to 2 MDs which agree  Dr. Erlinda Hong to discuss plan of care with Palliative Care MD today - DNR, do not replace PEG, SNF placement  Right lower lobe aspiration pneumonia resolved  Intubated 01/20/2016 for respiratory distress that occurred following ativan dosing  CCM on board  Self extubated 8/15  vanco d/c'ed  On Zosyn - completed 7 day course (Saturday is day #8)  Repeat CXR resolved RLL pneumonia   Cerebral edema, induced hypernatremia  Treated with 3% saline, now off  Na 153->154->149  Will let Na drift down  Obstructive hydrocephalus  Increasing left lateral ventricle hydrocephalus  Not a good candidate for EVD placement  Continue observation  Repeat CT 01/26/2016 shows decreasing edema/hydrocephalus  Cardiomyopathy  EF 15%  Pacer in place  Not good candidate for anticoagulation due to social condition and noncompliance  On aspirin 325  Dysphagia  Secondary to stroke  CorTrack placed and on tube feedings  Pulled off NG and re-inserted  NPO  Chronic Atrial Fibrillation  Home anticoagulation:  none   Not a good anticoagulation candidate so far secondary to social situation and compliance  On aspirin   Ongoing treatment with Cardizem   Leukocytosis  WBC 17.5-> 20.2-> 13.4->11.9  Treated with 7 day Zosyn course  Hypertension  Stable Long-term BP goal normotensive  Hyperlipidemia  Hx of HLD but not on home meds  LDL 65, at goal < 70  Tobacco abuse  Current smoker  Smoking cessation counseling provided  Other Stroke Risk Factors  Advanced age  ETOH  use, advised to drink no more than 2 drink(s) a day  Marijuana use, UDS not done this admission   Other Active Problems  History non-Hodgkin's lymphoma  History of GI bleeding  History tubular adenoma  History of complete heart block s/p pacer  Lower extremities leg wounds - patient states from insect bite - local pruritis, developing wounds. WOC consulted to evaluate, recommend treatment -> Cleanse left hip wound with normal saline, pat dry. Cover wound bed with Xeroform gauze and secure with foam dressing. Change every other day. (2) Apply protective foam dressing to left malleolus and left knee. Change prn  01/31/2016 court hearing regarding comfort care measures.  Hypokalemia - 3.2  BUN 31    Hospital day # 14  ATTENDING NOTE: Patient was seen and examined by me personally. Documentation reflects findings. The laboratory and radiographic studies reviewed by me. ROS pertinent positives could not be fully documented due to LOC  Condition: unchanged medically;  LOC decreased.  Comfort Care only  Assessment and plan completed by me personally and fully documented above. Plans/Recommendations include:     01/31/2016 court hearing  SIGNED BY: Dr. Elissa Hefty      To contact Stroke Continuity provider, please refer to [email or web address] After hours, contact General Neurology

## 2016-01-28 NOTE — Progress Notes (Signed)
I checked on Mr. Tschirhart this afternoon.  Minimal prn usage noted.  He is lying quietly in bed.  No distress. Appears comfortable.  Comfort care. Will continue to follow for symptom management.  Micheline Rough, MD [place] Team [phone]

## 2016-01-29 DIAGNOSIS — E43 Unspecified severe protein-calorie malnutrition: Secondary | ICD-10-CM

## 2016-01-29 DIAGNOSIS — I63419 Cerebral infarction due to embolism of unspecified middle cerebral artery: Secondary | ICD-10-CM

## 2016-01-29 MED ORDER — HALOPERIDOL 0.5 MG PO TABS: 0.5000 mg | ORAL_TABLET | ORAL | 0 refills | Status: AC | PRN

## 2016-01-29 MED ORDER — ACETAMINOPHEN 650 MG RE SUPP: 650.0000 mg | Freq: Four times a day (QID) | RECTAL | 0 refills | Status: AC | PRN

## 2016-01-29 MED ORDER — HALOPERIDOL LACTATE 2 MG/ML PO CONC: 1.0000 mg | ORAL | 0 refills | Status: AC | PRN

## 2016-01-29 NOTE — Progress Notes (Signed)
I checked on [name] this afternoon.  Minimal prn usage noted.  He is lying quietly in bed.  No distress. Appears comfortable.  Comfort care. Will continue to follow for symptom management.    CSW inquired about appropriateness of hospice placement for EOL care.  While I am not sure of the process to transition to hospice due to pending guardianship from DSS, I fully support the appropriateness of transition to hospice and believe this is how [name] would be best served if it can be arranged.  Micheline Rough, MD [place] Team [phone]

## 2016-01-29 NOTE — Discharge Summary (Signed)
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 not on anticoagulation vs cardiomyopathy (EF 15%) Active Problems:   Pressure ulcer   Cytotoxic brain edema ([place])   Brain herniation ([facility])   Atrial fibrillation ([place])   Acute blood loss anemia   Acute on chronic combined systolic and diastolic heart failure ([place])   Benign essential HTN   Complete heart block ([facility])   Dysphagia, post-stroke   Head injury   Hyperkalemia   Hypernatremia   Lethargy   PAD (peripheral artery disease) ([place])   Tachycardia   Tachypnea   Tobacco abuse   Acute respiratory failure with hypoxia ([place])   Encounter for intubation   Palliative care encounter   Protein-calorie malnutrition, severe ([place])     BMI: Body mass index is 14.72 kg/m.  Past Medical History:  Diagnosis Date  . A-fib ([place])   . Cancer ([place])    non hodgkins  . Complete heart block ([place])   . GIB (gastrointestinal bleeding)   . Hyperlipidemia   . Hypertension   . Tubular adenoma    Past Surgical History:  Procedure Laterality Date  . APPENDECTOMY    . CHOLECYSTECTOMY    . ESOPHAGOGASTRODUODENOSCOPY    . PACEMAKER PLACEMENT    . PARTIAL COLECTOMY        Medication List    STOP taking these medications   albuterol (2.5 MG/3ML) 0.083% nebulizer solution Commonly known as:  PROVENTIL   albuterol 108 (90 Base) MCG/ACT inhaler Commonly known as:  PROVENTIL HFA;VENTOLIN HFA   allopurinol 100 MG tablet Commonly known as:   ZYLOPRIM   aspirin EC 81 MG tablet   carvedilol 6.25 MG tablet Commonly known as:  COREG   cephALEXin 500 MG capsule Commonly known as:  KEFLEX   oxyCODONE-acetaminophen 5-325 MG tablet Commonly known as:  PERCOCET/ROXICET   sertraline 25 MG tablet Commonly known as:  ZOLOFT     TAKE these medications   acetaminophen 650 MG suppository Commonly known as:  TYLENOL Place 1 suppository (650 mg total) rectally every 6 (six) hours as needed for mild pain (or Fever >/= 101).   haloperidol 0.5 MG tablet Commonly known as:  HALDOL Take 1 tablet (0.5 mg total) by mouth every 4 (four) hours as needed for agitation (or delirium).   haloperidol 2 MG/ML solution Commonly known as:  HALDOL Place 0.5 mLs (1 mg total) under the tongue every 4 (four) hours as needed for agitation (or delirium).       LABORATORY STUDIES CBC    Component Value Date/Time   WBC 11.9 (H) 01/26/2016 0331   RBC 3.90 (L) 01/26/2016 0331   HGB 11.3 (L) 01/26/2016 0331   HCT 37.6 (L) 01/26/2016 0331   PLT 190 01/26/2016 0331   MCV 96.4 01/26/2016 0331   MCH 29.0 01/26/2016 0331   MCHC 30.1 01/26/2016 0331   RDW 16.4 (H) 01/26/2016 0331   LYMPHSABS 1.3 01/20/2016 0514   MONOABS 0.8 01/20/2016 0514   EOSABS 0.0 01/20/2016 0514  BASOSABS 0.0 01/20/2016 0514   CMP    Component Value Date/Time   NA 149 (H) 01/26/2016 0331   K 3.2 (L) 01/26/2016 0331   CL 117 (H) 01/26/2016 0331   CO2 24 01/26/2016 0331   GLUCOSE 92 01/26/2016 0331   BUN 31 (H) 01/26/2016 0331   CREATININE 0.81 01/26/2016 0331   CALCIUM 8.6 (L) 01/26/2016 0331   PROT 6.7 01/20/2016 1458   ALBUMIN 2.8 (L) 01/20/2016 1458   AST 23 01/20/2016 1458   ALT 24 01/20/2016 1458   ALKPHOS 72 01/20/2016 1458   BILITOT 1.0 01/20/2016 1458   GFRNONAA >60 01/26/2016 0331   GFRAA >60 01/26/2016 0331   COAGS Lab Results  Component Value Date   INR 1.12 01/14/2016   Lipid Panel    Component Value Date/Time   CHOL 104 01/15/2016 0405    TRIG 46 01/15/2016 0405   HDL 31 (L) 01/15/2016 0405   CHOLHDL 3.4 01/15/2016 0405   VLDL 9 01/15/2016 0405   LDLCALC 64 01/15/2016 0405   HgbA1C  Lab Results  Component Value Date   HGBA1C 6.7 (H) 01/15/2016   Cardiac Panel (last 3 results) No results for input(s): CKTOTAL, CKMB, TROPONINI, RELINDX in the last 72 hours. Urinalysis No results found for: COLORURINE, APPEARANCEUR, LABSPEC, PHURINE, GLUCOSEU, HGBUR, BILIRUBINUR, KETONESUR, PROTEINUR, UROBILINOGEN, NITRITE, LEUKOCYTESUR Urine Drug Screen No results found for: LABOPIA, COCAINSCRNUR, LABBENZ, AMPHETMU, THCU, LABBARB  Alcohol Level    Component Value Date/Time   ETH <5 01/14/2016 1900     SIGNIFICANT DIAGNOSTIC STUDIES  CT Head Wo Contrast  01/26/2016 1. Large right MCA and ACA infarcts with interval regressed edema and intracranial mass effect. Decreased leftward midline shift, now 7 mm.  2. Petechial hemorrhage without malignant hemorrhagic transformation.  3. Stable mass effect on the right lateral ventricle and minimal to mild enlargement of the left lateral ventricle.  4. No new intracranial abnormality. Chronic posterior left hemisphere infarct.  01/22/2016 Little change in appearance from the most recent study in the large subacute infarct involving the right ACA and MCA distributions. The degree of midline shift is unchanged, although there is slightly more dilatation of the left lateral ventricle.  01/20/2016 Right ACA and MCA infarct similar distribution to the prior study. There is no acute hemorrhage. There is increased edema and mass-effect with increased midline shift. Progressive obstruction of the left lateral ventricle.   01/17/2016 1. Large evolving subacute right MCA territory infarct, with associated smaller right ACA territory infarct. No evidence for hemorrhagic transformation. 2. Worsened localized edema with increased 11 mm of right-to-left shift. No hydrocephalus. Slightly worsened asymmetric  dilatation of the left lateral ventricle concerning for possible developing ventricular trapping.   01/14/2016 Hyperdense right MCA indicative of thrombus, with evidence of subacute ischemia in the right MCA distribution,   Ct Angio Head and neck W Or Wo Contrast 01/15/2016 1. 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 Nonhemorrhagic acute infarct throughout the right MCA territory and in the distal right ACA territory. 2. Remote left parietal occipital infarct. 3. Extensive debris in the central airways. Correlate for symptoms of aspiration.   Ct Cervical Spine Wo Contrast 01/14/2016  1. Hyperdense right MCA indicative of thrombus, with evidence of subacute ischemia in the right MCA distribution, as above. 2. No evidence of significant acute traumatic injury to the facial bones or cervical spine. Critical Value/emergent results were called by telephone at the time of interpretation on 01/14/2016 at 4:34 pm to Dr. Shon Hale,  who verbally acknowledged these results. Electronically Signed   By: Vinnie Langton M.D.   On: 01/14/2016 16:54   Ct Chest W Contrast Ct Abdomen Pelvis W Contrast 01/14/2016 1. Filling defect in the apex of the left ventricle highly concerning for thrombus. This may suggest an embolic source as a cause of the patient's right MCA occlusion. This was discussed by telephone at the time of interpretation on 01/14/2016 at 5:02 pm to Dr. Shon Hale, who verbally acknowledged these results. 2. No evidence of significant acute traumatic injury to the chest, abdomen or pelvis. 3. The appearance of the lungs suggests mild congestive heart failure, with mild interstitial pulmonary edema and trace bilateral pleural effusions. 4. 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 Whether this is chronic (favored), or sequela of large embolic event is uncertain. 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 Celiac  axis and SMA are both patent. 5. Area in the spleen which is likely related to prior splenic infarct, suggesting prior embolic event. 6. Cardiomegaly with left ventricular and left atrial dilatation. 7. Aortic atherosclerosis, in addition to 3 vessel coronary artery disease. Please note that although the presence of coronary artery calcium documents the presence of coronary artery disease, the severity of this disease and any potential stenosis cannot be assessed on this non-gated CT examination. Assessment for potential risk factor modification, dietary therapy or pharmacologic therapy may be warranted, if clinically indicated. 8. Additional incidental findings, as above.   Dg Chest Port 1 View 01/24/2016 Endotracheal tube has been removed. Stable left IJ central line position. Streaky right basilar atelectasis or infiltrate. No pneumothorax. No pulmonary edema. 01/22/2016 1.  Stable lines and tubes. 2. Pulmonary hyperinflation.  No acute cardiopulmonary abnormality.  01/21/2016 No acute abnormalities.  01/20/2016 Endotracheal tube placed Right basilar pneumonia has improved.  01/20/2016 Increased right mid lower lung airspace opacities which may represent aspiration or infection. Enteric tube courses inferior to the diaphragm. Left IJ central venous catheter tip projects over the superior vena cava.  01/15/2016 Left IJ central line in place.  No pneumothorax.   2-D echocardiogram - Left ventricle: The cavity size was mildly dilated. Wall thickness was normal. Systolic function was severely reduced. The estimated ejection fraction was 15%. Diffuse hypokinesis.  - Aortic valve: There was mild regurgitation. - Mitral valve: Calcified annulus. There was moderateregurgitation. - Left atrium: The atrium was severely dilated. - Right atrium: The atrium was mildly dilated. Impressions:   - Severe global reduction in LV function; mild AI; moderate MR; biatrial enlargement; mild TR.  EEG  1. Mild  diffuse slowing of the waking background. 2. Focal slowing over the left hemisphere. There were no epileptiform discharges or electrographic seizures seen.     HISTORY OF PRESENT ILLNESS This is a 33-yo man who was in jail last night and by report, he was released this afternoon. Per ED RN, EMS reported that he got into a "scuffle" with officers at the jail. They reportedly carried him out to a bench. He fell off of the bench and EMS was activated. They arrived to find the patient with left-sided weakness and abrasions over his face. He was initially thought to be a trauma but on arrival to ED the attending called [place]. The patient is not complaining of anything at this time apart from wanting something to eat. No further information is available at this time.  He was last seen normal: 1430 on 01/14/2016. NIHSS score:  19. Patient was not administered IV t-PA or considered for intervention secondary to  Large subacute stroke seen on CT, ASPECTS score 2. He was admitted to the neuro ICU for further evaluation and treatment.    HOSPITAL COURSE [name] is a 69 y.o. male with history of AFib not on anticoagulation, S/P pacer, non-hodgkins lymphoma, GIB with tubular adenoma, HLD, and HTN presenting after a fall off a park bench with L sided weakness. He did not receive IV t-PA or IR due to high ASPECTS score.   Stroke:  Right MCA infarct in setting of hyperdense R MCA, felt to be embolic secondary to known atrial fibrillation not on anticoagulation vs cardiomyopathy (EF 15%)  Resultant  left hemiplegia, VDRF (resolved)  CTA head and neck distal right ICA thrombus extending into right M1 and A1 segments. right MCA and right ACA territory infarct. Old left parietal occipital infarct.  MRI  / MRA  Pacemaker  CT repeat x 3 increasing edema and left lateral ventricle hydrocephalus  Repeat CT shows resolving edema  2D Echo  EF 15%. No source of embolus. Diffuse hypokinesis.  EEG no  definite seizure activity, focal slowing on the left. Mildly diffuse slow.  LDL 64  HgbA1c 6.7  aspirin 81 mg daily prior to admission, initially placed on aspirin 300 mg suppository daily.   Therapy recommendations:  SNF  Disposition:   [facility] (homeless. recently discharged from jail, no family, reportedly has a girlfriend)  Diposition   Neuro and respiratory worsening 01/20/2016 - decreased level of reponsivness, tachycardia, tachypnea, fever, and chest congestion.   Repeat CT increasing edema, midline shift, and left lateral ventricle hydrocephalus  Poor neurologic prognosis, given large right MCA stroke, hydrocephalus, cerebral edema, PTA lymphoma, lack of social support  Agreeable to DO NOT RESUSCITATE   Self extubated 8/15  Pulled off NG tube 8/17 - re-inserted, then pulled out again  Ethic committee met, discussed case. defers care decision to 2 MDs which agree  Dr. Erlinda Hong discussed plan of care with Palliative Care MD - they agree - DNR, do not replace PEG, comfort care  01/31/2016 court hearing scheduled to appoint legal guardian  Right lower lobe aspiration pneumonia resolved  Intubated 01/20/2016 for respiratory distress that occurred following ativan dosing  Self extubated 8/15  Initial treatment with Vanc, d/c'd   On Zosyn - completed 7 day course   Repeat CXR resolved RLL pneumonia   Cerebral edema, induced hypernatremia  Treated with 3% saline, now off  Na 153->154->149  Obstructive hydrocephalus  Increasing left lateral ventricle hydrocephalus  Not a good candidate for EVD placement  Continue observation  Repeat CT 01/26/2016 shows decreasing edema/hydrocephalus  Cardiomyopathy  EF 15%  Pacer in place  Not good candidate for anticoagulation due to social condition and noncompliance  Dysphagia  Secondary to stroke  CorTrack placed and on tube feedings  Pulled off NG x 2  NPO  Chronic Atrial Fibrillation  Home  anticoagulation:  none   Not a good anticoagulation candidate so far secondary to social situation and compliance  Leukocytosis  WBC 17.5-> 20.2-> 13.4->11.9  Treated with 7 day Zosyn course  Hypertension  Stable  Hyperlipidemia  Hx of HLD but not on home meds  LDL 65, at goal < 70  Tobacco abuse  Current smoker  Smoking cessation counseling provided  Other Stroke Risk Factors  Advanced age  ETOH use, advised to drink no more than 2 drink(s) a day  Marijuana use, UDS not done this  admission   Other Active Problems  History non-Hodgkin's lymphoma  History of GI bleeding  History tubular adenoma  History of complete heart block s/p pacer  Lower extremities leg wounds - patient states from insect bite - local pruritis, developing wounds. WOC consulted to evaluate, recommend treatment -> Cleanse left hip wound with normal saline, pat dry. Cover wound bed with Xeroform gauze and secure with foam dressing. Change every other day. (2) Apply protective foam dressing to left malleolus and left knee. Change prn  Hypokalemia, replaced  - 3.2  BUN 31, Cr 0.81   DISCHARGE EXAM Blood pressure (!) 128/95, pulse (!) 113, temperature 97.8 F (36.6 C), resp. rate 18, height _0  (1.753 m), weight 45.2 kg (99 lb 10.4 oz), SpO2 92 %. General - severely malnourished with lack of muscle mass  Cardiovascular - irregularly irregular heart rate and rhythm.  Pulmonary:  Scattered rhonchi  Abd:  Flat, quiet bowel sounds  Extremities:  Multiple areas of skins changes left leg greater than right and ecchymosis on arms  Neuro - sleepy and poorly arousable.  Did attempt to vocalize but unintelligible.  Did not follow commands. PERRL, R gaze preference but able to cross midline. Positive corneal and gag. Right upper extremity and lower extremity spontaneous movement, purposeful, and strong. Left upper extremity 0/5, left lower extremity proximal 3-/5 and distal 0/5. Muscle  tone decreased on the left, DTR 1+, Babinski negative. Sensation, coordination, and gait not tested.  Discharge Diet   Diet NPO time specified liquids  DISCHARGE PLAN  Disposition:  [place] for comfort care  Dr. Orpah Melter to assume care  45 minutes were spent preparing discharge.  [place] for Pager information 01/29/2016 3:33 PM  I have personally examined this patient, reviewed notes, independently viewed imaging studies, participated in medical decision making and plan of care. I have made any additions or clarifications directly to the above note. Agree with note above. Antony Contras, MD Medical Director [facility] Pager: [phone] 01/29/2016 4:27 PM
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
## 2016-01-29 NOTE — Progress Notes (Signed)
[facility] Note for BP   Received request from [place] for interest in [facility] with request to transfer today.  Per SW , patient has no family or friends who would be capable   DSS involved and patient is to have hearing on Wednesday 8/23  to appoint legal guardian .  Chart reviewed, report received from staff RN.   Visited patient in room in attempt to explain services.  patient lying with eyes open but did not respond.   Met with Surveyor, quantity of SW for [facility] to complete registration paperwork for BP.  CSW is aware.  Dr. Orpah Melter to assume care   Please fax discharge summary to [phone].    RN please call report to [phone].  Please arrange transport as soon as possible.    Thank you for this referral.    Mickie Kay, [facility] Liaison  [phone]

## 2016-01-29 NOTE — Progress Notes (Signed)
Patient has bed at [facility] today- DSS aware  Patient will discharge to [facility] Anticipated discharge date: 8/21 Family notified: DSS notified Transportation by [facility]- called at 3:50pm  [place] signing off.  Jorge Ny, [place] Social Worker [phone]

## 2016-01-29 NOTE — Progress Notes (Addendum)
CSW discussed pt case with palliative MD who states that hospice is an appropriate plan and has been agreed upon by medical team.  New paperwork on chart for [facility] Canner to have authorization in obtaining medical records- this has no baring on medical decision making  Referrals made to [facility] for residential placement- CSW supervisor will sign patient into hospice when bed available.  [facility]eman is reviewing referral and can accept patient with pending guardianship from [place] unable to accept patient until interim guardian decided (hearing 8/23)  CSW will continue to follow   Jorge Ny, [place] Social Worker [phone]

## 2016-01-29 NOTE — Care Management Note (Signed)
Case Management Note  Patient Details  Name: [name] MRN: [id number] Date of Birth: 03-04-1946  Subjective/Objective:                 Patient to DC to [facility] today as facilitated by CSW.    Action/Plan:   Expected Discharge Date:                  Expected Discharge Plan:  [place]  In-House Referral:  Clinical Social Work  Discharge planning Services  CM Consult  Post Acute Care Choice:    Choice offered to:     DME Arranged:    DME Agency:     HH Arranged:    [place] Agency:     Status of Service:  Completed, signed off  If discussed at [employer], dates discussed:    Additional Comments:  Carles Collet, RN 01/29/2016, 4:39 PM

## 2016-01-29 NOTE — Progress Notes (Signed)
Nutrition Brief Note  Chart reviewed. Pt now transitioning to comfort care.  No further nutrition interventions warranted at this time.  Please re-consult as needed.   Zarria Towell A. Sherri Levenhagen, RD, LDN, CDE Pager: [phone] After hours Pager: [phone]  
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
## 2016-01-29 NOTE — Progress Notes (Signed)
Report called to hospice. Joslyn Hy, MSN, RN, [employer]

## 2016-01-30 ENCOUNTER — Encounter (HOSPITAL_COMMUNITY): Payer: Self-pay | Admitting: Emergency Medicine

## 2016-01-30 NOTE — Progress Notes (Signed)
(  Late Entry) 01/29/2016 1530 - CSW Surveyor, quantity signed admissions paperwork for [employer].   1040 - CSW Assisted Director was notified earlier in the morning that [employer] had passed away. A courtesy call voicemail with this information was left for Guardian Ad Litem [employer].  Elana Alm, Environmental manager of [place] [phone]

## 2016-01-31 NOTE — ED Provider Notes (Signed)
CSN: [id number] Arrival date &time: 7/24/171726  First Provider Contact: 9:45 PM  By signing my name below, I, [name], attest that this documentation has been prepared under the direction and in the presence of [employer], PA-C.  Electronically Signed: Reola Goodwin, ED Scribe. 01/01/16. 9:57 PM.  History  Chief Complaint    Chief Complaint  Patient presents with  . Hip Pain   LEVEL 5 CAVEAT: HPI and ROS limited due to age  HPI HPI Comments: [name] a 70 y.o.maleBIB [facility], with a PMHx significant of gout, depression, HTN, and HLD, who presents to the Emergency Department complaining of gradual onset, gradually worsening, constant left sided hip pain that radiates down to his leftknee onset PTA. Pt reports that is scheduled to have an amputation to his left knee.He reports that was recently let out of jail this morning after being in there for 6 months. No noted trauma or injury. Denies any other symptoms. Pt is a poor historian.       Past Medical History:  Diagnosis Date  . Aspiration pneumonia ([place]) 10/07/2014  . ATHEROSCLEROSIS, AORTIC 12/24/2006   Qualifier: Diagnosis of By: Hilma Favors DO, Beth   . Atypical angina ([place]) 12/01/2014  . BLOCK, AV, COMPLETE 12/24/2006   Annotation: Pacer placed Qualifier: Diagnosis of By: Hilma Favors DO, Beth   . BPH (benign prostatic hyperplasia)   . 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 Chronic atrial fibrillation ([place])   . Chronic chest pain   . Complete heart block ([place])    St. Jude pacemaker - followed by Dr. Lovena Le  . Depression   . Essential hypertension   . Fall at nursing home    December 2014  . Fracture femur, cervicotrochanteric ([place]) 05/27/2013  . Gastric ulcer   . GIB (gastrointestinal bleeding)   . Gout attack   . History of migraine   . HLD  (hyperlipidemia)   . Malingering   . Non Hodgkin's lymphoma ([place])   . Tubular adenoma of colon         Patient Active Problem List   Diagnosis Date Noted  . Ileus, postoperative 08/05/2015  . Chest pain 08/05/2015  . COPD exacerbation ([place]) 08/04/2015  . Homelessness 08/04/2015  . Dysplastic rectal polyp   . Bleeding rectal polyp s/p partial proctectomy 08/03/2015   . Chronic abdominal pain   . 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 Pressure ulcer 11/16/2014  . Hx of colectomy   . History of adenomatous polyp of colon   . Rectal mass   . Essential hypertension   . Rectal bleeding   . Acute blood loss anemia   . Delirium   . Protein-calorie malnutrition, severe ([place]) 10/08/2014  . Hematochezia   . Sigmoid & cecal bleeding colon masses s/p abdominal colectomy 10/05/2014   . HLD (hyperlipidemia) 09/30/2014  . Depression 09/30/2014  . Abdominal pain 09/30/2014  . BPH (benign prostatic hyperplasia) 09/29/2014  . Thrombocytopenia ([place]) 12/24/2006  . DEPRESSION, MAJOR, MODERATE 12/24/2006  . LEARNING DISABILITY 12/24/2006  . 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 CARDIOMYOPATHY 12/24/2006  . BLOCK, AV, COMPLETE 12/24/2006  . ATHEROSCLEROSIS, AORTIC 12/24/2006  . Atrial fibrillation ([place]) 12/24/2006  . History of other specified conditions presenting hazards to health 12/24/2006  . CHOLECYSTECTOMY, HX OF 12/24/2006         Past Surgical History:  Procedure Laterality Date  . APPENDECTOMY    .  CHOLECYSTECTOMY    . COLONOSCOPY N/A 10/01/2014   Procedure: COLONOSCOPY; Surgeon: Jerene Bears, MD; Location[facility] ENDOSCOPY; Service: Endoscopy; Laterality: N/A;  . ESOPHAGOGASTRODUODENOSCOPY (EGD) WITH PROPOFOL N/A 09/26/2014   Procedure: ESOPHAGOGASTRODUODENOSCOPY (EGD) WITH PROPOFOL; Surgeon: Jerene Bears, MD; Location: [facility] ENDOSCOPY; Service: Endoscopy; Laterality: N/A;  . FLEXIBLE SIGMOIDOSCOPY N/A 11/07/2014   Procedure:  FLEXIBLE SIGMOIDOSCOPY; Surgeon: Jerene Bears, MD; Location: [facility] ENDOSCOPY; Service: Endoscopy; Laterality: N/A;  . FLEXIBLE SIGMOIDOSCOPY N/A 07/30/2015   Procedure: FLEXIBLE SIGMOIDOSCOPY; Surgeon: Doran Stabler, MD; Location: [facility] ENDOSCOPY; Service: Endoscopy; Laterality: N/A;  . FLEXIBLE SIGMOIDOSCOPY N/A 08/03/2015   Procedure: FLEXIBLE SIGMOIDOSCOPY; Surgeon: [name], MD; Location: [facility] ORS; Service: General; Laterality: N/A;  . GASTRIC RESECTION    . OPEN REDUCTION OF HIP Right 05/28/2013   Procedure: OPEN REDUCTION OF HIP; Surgeon: Renette Butters, MD; Location: [place]; Service: Orthopedics; Laterality: Right;  . PACEMAKER GENERATOR CHANGE     Dr. Lovena Le  . PARTIAL COLECTOMY N/A 10/05/2014   Procedure: SUBTOTAL COLECTOMY WITH ILEORECTAL ANASTOMOSIS; Surgeon: Donnie Mesa, MD; Location: [place]; Service: General; Laterality: N/A;  . TRANSANAL EXCISION OF RECTAL MASS N/A 08/03/2015   Procedure: TRANSANAL MINIMALLY INVASIVE SURGERY TO RESECT RECTAL POLYP; Surgeon: [name], MD; Location: [facility] ORS; Service: General; Laterality: N/A;     Home Medications          Prior to Admission medications   Medication Sig Start Date End Date Taking? Authorizing Provider  albuterol (PROVENTIL HFA;VENTOLIN HFA) 108 (90 Base) MCG/ACT inhaler Inhale 2 puffs into the lungs every 4 (four) hours as needed for wheezing or shortness of breath. 08/07/15   Venetia Maxon Rama, MD  albuterol (PROVENTIL) (2.5 MG/3ML) 0.083% nebulizer solution Take 2.5 mg by nebulization every 6 (six) hours as needed for wheezing or shortness of breath.    Historical Provider, MD  alum & mag hydroxide-simeth (MAALOX/MYLANTA) 200-200-20 MG/5ML suspension Take 30 mLs by mouth 3 (three) times daily.    Historical Provider, MD  aspirin EC 81 MG tablet Take 81 mg by mouth daily. Reported on 11/19/2015    Historical Provider, MD  Calcium Carbonate-Vitamin D  (CALCIUM-VITAMIN D) 500-200 MG-UNIT tablet Take 1 tablet by mouth 2 (two) times daily.    Historical Provider, MD  carvedilol (COREG) 6.25 MG tablet Take 1 tablet (6.25 mg total) by mouth 2 (two) times daily with a meal. 09/23/15   Blanchie Dessert, MD  diazepam (VALIUM) 5 MG tablet Take 5 mg by mouth every 12 (twelve) hours as needed for anxiety. Reported on 11/19/2015    Historical Provider, MD  diphenoxylate-atropine (LOMOTIL) 2.5-0.025 MG tablet Take 1 tablet by mouth 4 (four) times daily as needed for diarrhea or loose stools. Patient not taking: Reported on 11/19/2015 09/25/15   Tanna Furry, MD  feeding supplement, ENSURE ENLIVE, (ENSURE ENLIVE) LIQD Take 237 mLs by mouth 2 (two) times daily between meals. Patient not taking: Reported on 11/19/2015 08/07/15   Venetia Maxon Rama, MD  finasteride (PROSCAR) 5 MG tablet Take 5 mg by mouth daily.    Historical Provider, MD  magnesium oxide (MAG-OX) 400 MG tablet Take 400 mg by mouth 2 (two) times daily.    Historical Provider, MD  omeprazole (PRILOSEC) 40 MG capsule Take 40 mg by mouth daily. Reported on 11/19/2015    Historical Provider, MD  oxyCODONE-acetaminophen (PERCOCET/ROXICET) 5-325 MG tablet Take 1 tablet by mouth every 6 (six) hours as needed for severe pain. Reported on 11/19/2015    Historical Provider, MD  pravastatin (  PRAVACHOL) 10 MG tablet Take 10 mg by mouth daily.    Historical Provider, MD  predniSONE (DELTASONE) 20 MG tablet Take 3 tablets (60 mg total) by mouth daily. Patient not taking: Reported on 11/19/2015 10/27/15   Delice Bison Ward, DO  thiamine 100 MG tablet Take 100 mg by mouth daily.    Historical Provider, MD    Family History      Family History  Problem Relation Age of Onset  . Colon cancer Mother     Social History        Social History  Substance Use Topics  . Smoking status: Current Every Day Smoker    Packs/day: 0.50    Years: 66.00    Types: Cigarettes  .  Smokeless tobacco: Former Systems developer    Types: Snuff, Chew     Comment: 05/27/2013 "haven't used chew or snuff in ~ 30 yr"  . Alcohol use 0.0 oz/week      Comment: occasionally     Allergies  Review of patient's allergies indicates no known allergies.   Review of Systems Review of Systems LEVEL 5 CAVEAT: HPI and ROS limited due to age  Physical Exam Updated Vital Signs BP 107/80 (BP Location: Right Arm)  Pulse 80  Temp 100.5 F (38.1 C) (Oral)  Resp 18  SpO2 94%   Physical Exam  Constitutional: He is well-developed, well-nourished, and in no distress.  Nontoxic appearing, in no distress.  HENT:  Head: Normocephalic and atraumatic.  Eyes: Conjunctivae and EOM are normal.  Neck: Normal range of motion.  Cardiovascular: Normal rate, regular rhythm and intact distal pulses.   DP pulse 2+ in the LLE  Pulmonary/Chest: Effort normal. No respiratory distress.  Respirations even and unlabored  Musculoskeletal: Normal range of motion.  Neurological: He is alert. He exhibits normal muscle tone. Coordination normal.  GCS 15. Patient moving all extremities. Ambulatory in the department with steady gait.  Skin: Skin is warm. No pallor.  Nursing note and vitals reviewed.    ED Treatments / Results  DIAGNOSTIC STUDIES: Oxygen Saturation is 94% on RA, adquateby my interpretation.   COORDINATION OF CARE: 9:44 PM-Discussed next steps with pt. Pt verbalized understanding and is agreeable with the plan.   Labs (all labs ordered are listed, but only abnormal results are displayed) Labs Reviewed - No data to display  EKG           EKG Interpretation None       Radiology  Imaging Results  Dg Hip Unilat With Pelvis 2-3 Views Left  Result Date: 01/01/2016 CLINICAL DATA:  Left hip pain following fall today, initial encounter EXAM: DG HIP (WITH OR WITHOUT PELVIS) 2-3V LEFT COMPARISON:  11/10/2015 FINDINGS: Postoperative changes  are again noted in the proximal right femur. The pelvic ring is intact. Colonic postsurgical changes are noted as well. Diffuse vascular calcifications are seen. No acute fracture or dislocation is noted. Mild degenerative change of the left hip joint is noted. IMPRESSION: Mild degenerative change without acute abnormality. Electronically Signed   By: Inez Catalina M.D.   On: 01/01/2016 22:36    Procedures Procedures(including critical care time)   Initial Impression / Assessment and Plan / ED Course  I have reviewed the triage vital signs and the nursing notes.  Pertinent labs & imaging results that were available during my care of the patient were reviewed by me and considered in my medical decision making (see chart for details).  Clinical Course    70 year old male, well-known  to the emergency department, presents to the ED for complaints of left hip pain. He was recently released from jail. He has been ambulatory in the department without difficulty. Patient neurovascularly intact. Patient cleaned up during visit as he soiled himself with stool while awaiting his Xray.  Patient accompanied by representative from the public defender's office, concerned about the patient and his access to care. I have explained our limited ability for continuity of care given the nature of our department, but will provide resource guide. No indication for further emergent work up. Patient discharged in satisfactory condition.   Final Clinical Impressions(s) / ED Diagnoses   1. Hip pain  New Prescriptions    New Prescriptions   No medications on file   I personally performed the services described in this documentation, which was scribed in my presence. The recorded information has been reviewed and is accurate.      Antonietta Breach, PA-C 01/03/16 [id number]    Antonietta Breach, PA-C 01/31/16 [id number]    Merrily Pew, MD 01/31/16 [phone]

## 2016-02-09 DEATH — deceased

## 2016-06-19 ENCOUNTER — Encounter: Payer: Self-pay | Admitting: Internal Medicine

## 2016-06-25 IMAGING — CR DG CHEST 2V
2 series · 2 of 2 positions shown · non-contrast
Comparison: Two-view chest x-ray 11/25/2014

CLINICAL DATA: Left-sided chest pain.  Shortness of breath.

EXAM:
CHEST - 2 VIEW

[chest pa]
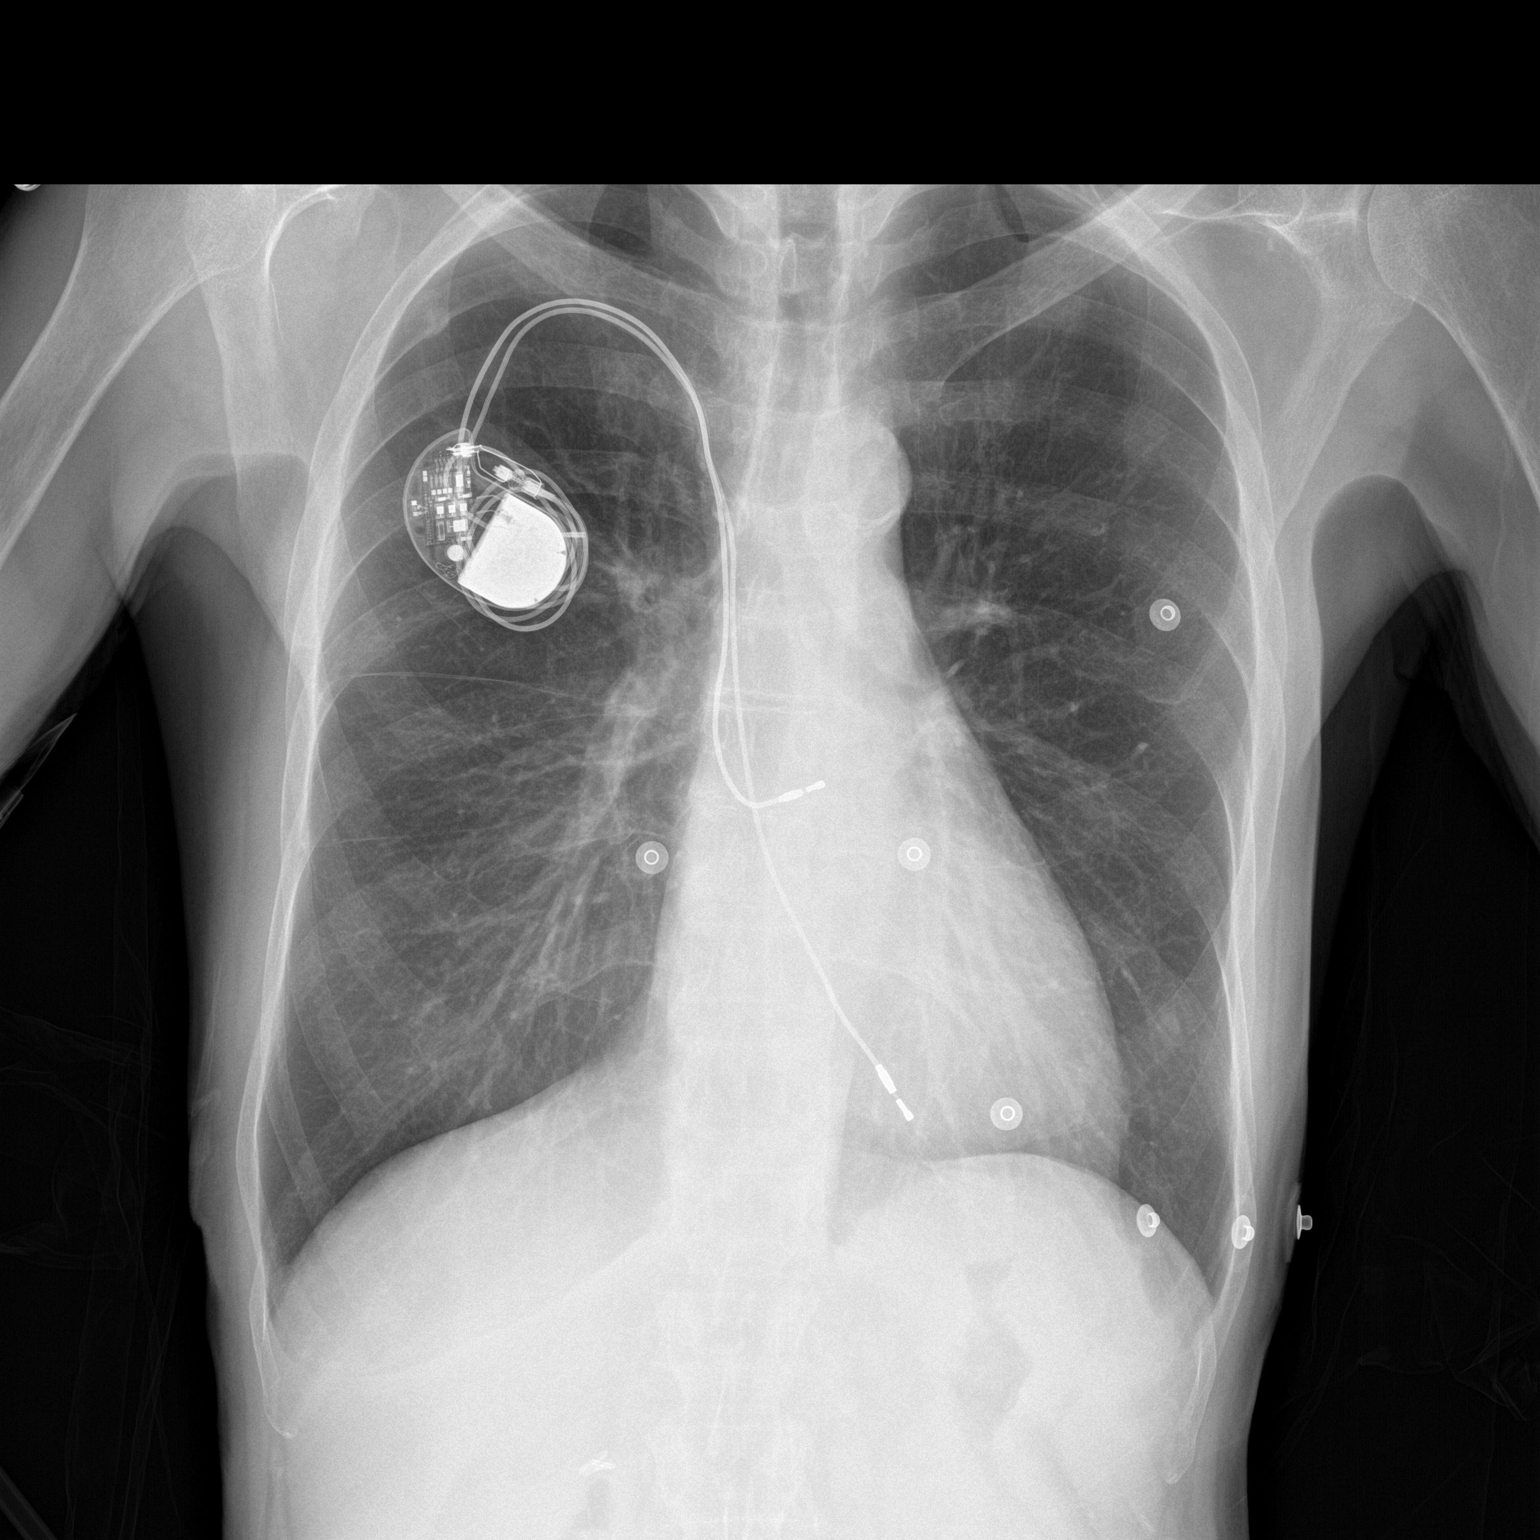

[chest lat]
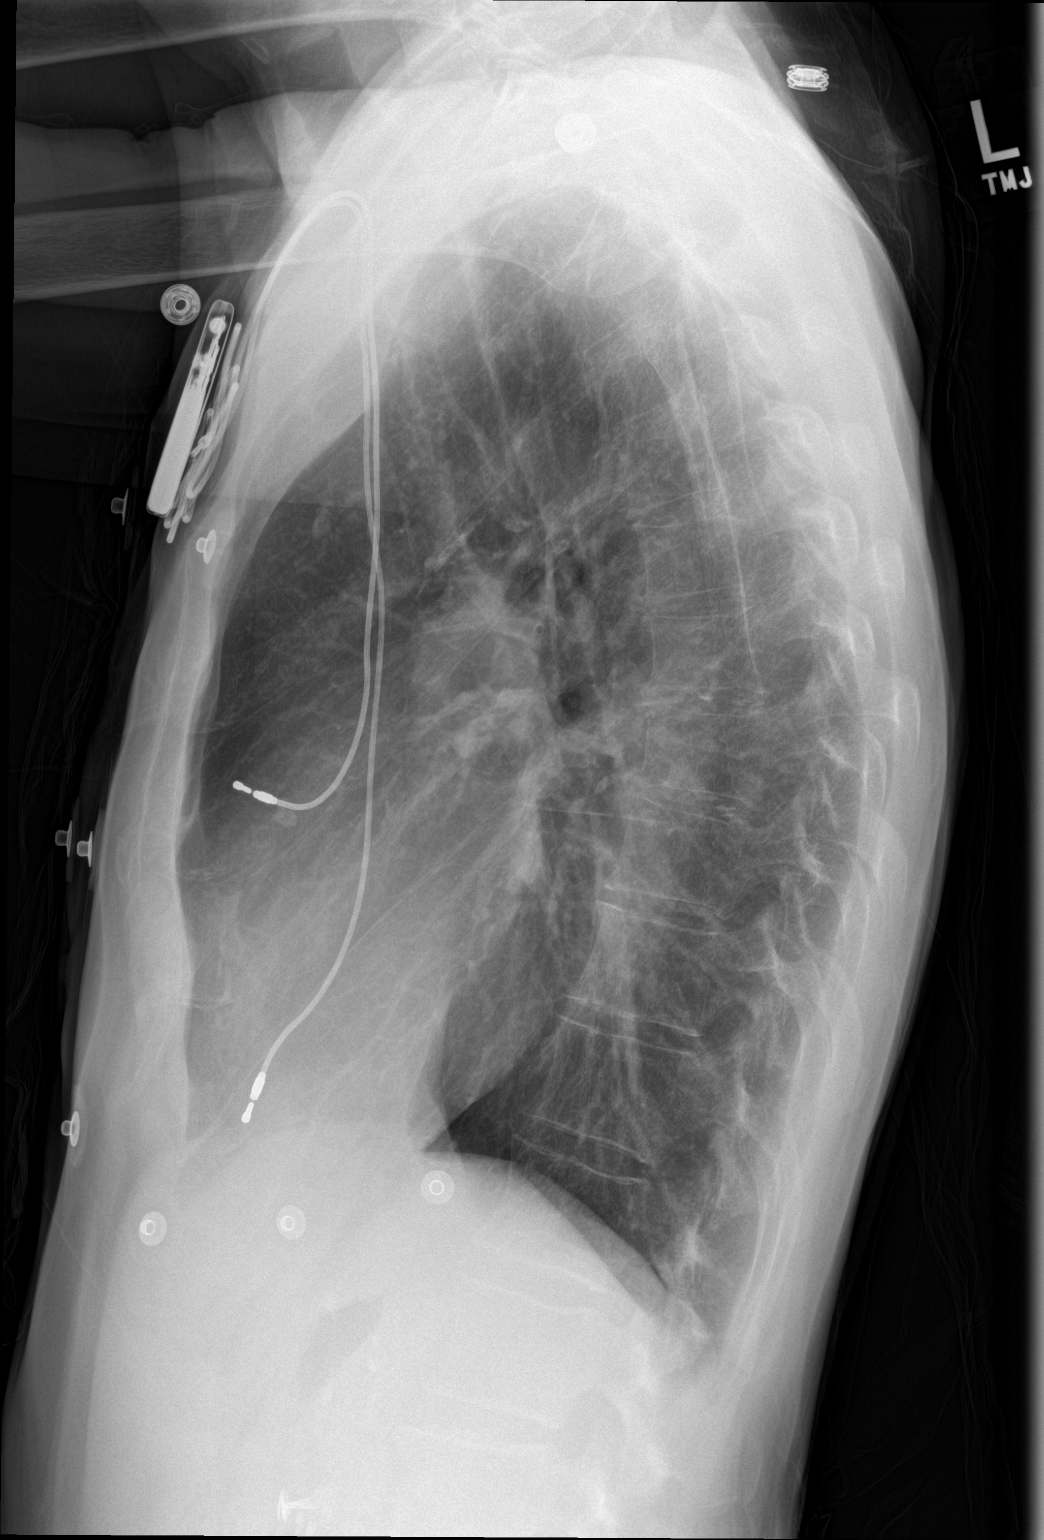

[2 of 2 positions shown; findings below may reference images not displayed]

FINDINGS: The heart size is normal. A dual lead pacemaker is stable in
position. Atherosclerotic changes are again noted at the aortic
arch. Emphysematous changes are present. No focal airspace disease
is evident. There is no edema or effusion to suggest failure. The
visualized soft tissues and bony thorax are unremarkable.
IMPRESSION: 1. No acute cardiopulmonary disease.
Imaging findings of potential clinical significance:

1. Emphysema
2. Atherosclerosis of the thoracic aorta.

## 2016-06-30 IMAGING — CR DG CHEST 1V PORT
1 series · 1 of 1 positions shown · non-contrast
Comparison: 12/01/2014, 11/25/2014, chest CT 11/23/2014

CLINICAL DATA: 68-year-old male with a history of chest pain.
Wheezing

EXAM:
PORTABLE CHEST - 1 VIEW

[AP]
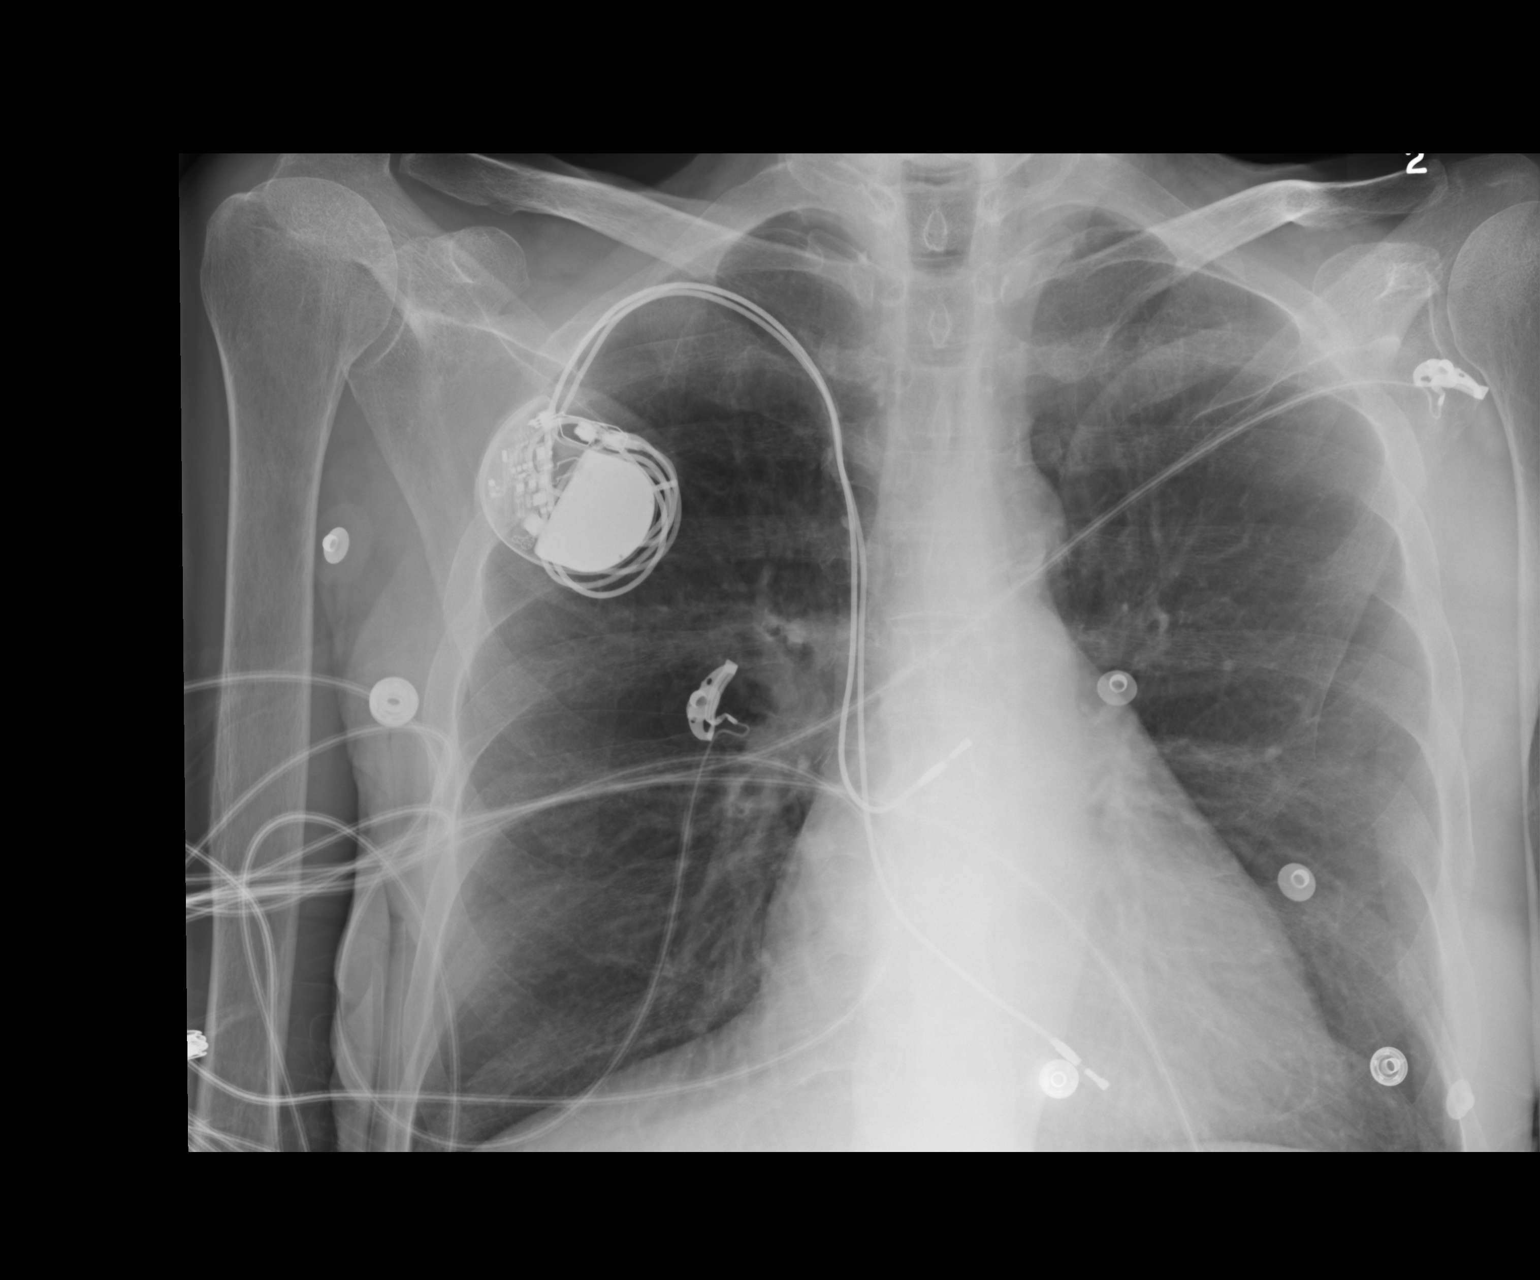

[1 of 1 positions shown; findings below may reference images not displayed]

FINDINGS: Cardiomediastinal silhouette unchanged in size and contour. No
evidence of pulmonary vascular congestion.

Cardiac pacing device on the right chest wall is unchanged with 2
leads in place.

Atherosclerotic calcification of the aortic arch.

No confluent airspace disease, pneumothorax, or pleural effusion.

Hyperinflation on the anterior view is again evident.
IMPRESSION: No radiographic evidence of acute cardiopulmonary disease, with
changes compatible with emphysema.

Unchanged position of cardiac pacing device on the right chest wall.

## 2016-07-04 IMAGING — DX DG CHEST 2V
2 series · 2 of 2 positions shown · non-contrast
Comparison: 12/06/2014

CLINICAL DATA: Chest pain and dyspnea for most of the day today

EXAM:
CHEST  2 VIEW

[chest pa]
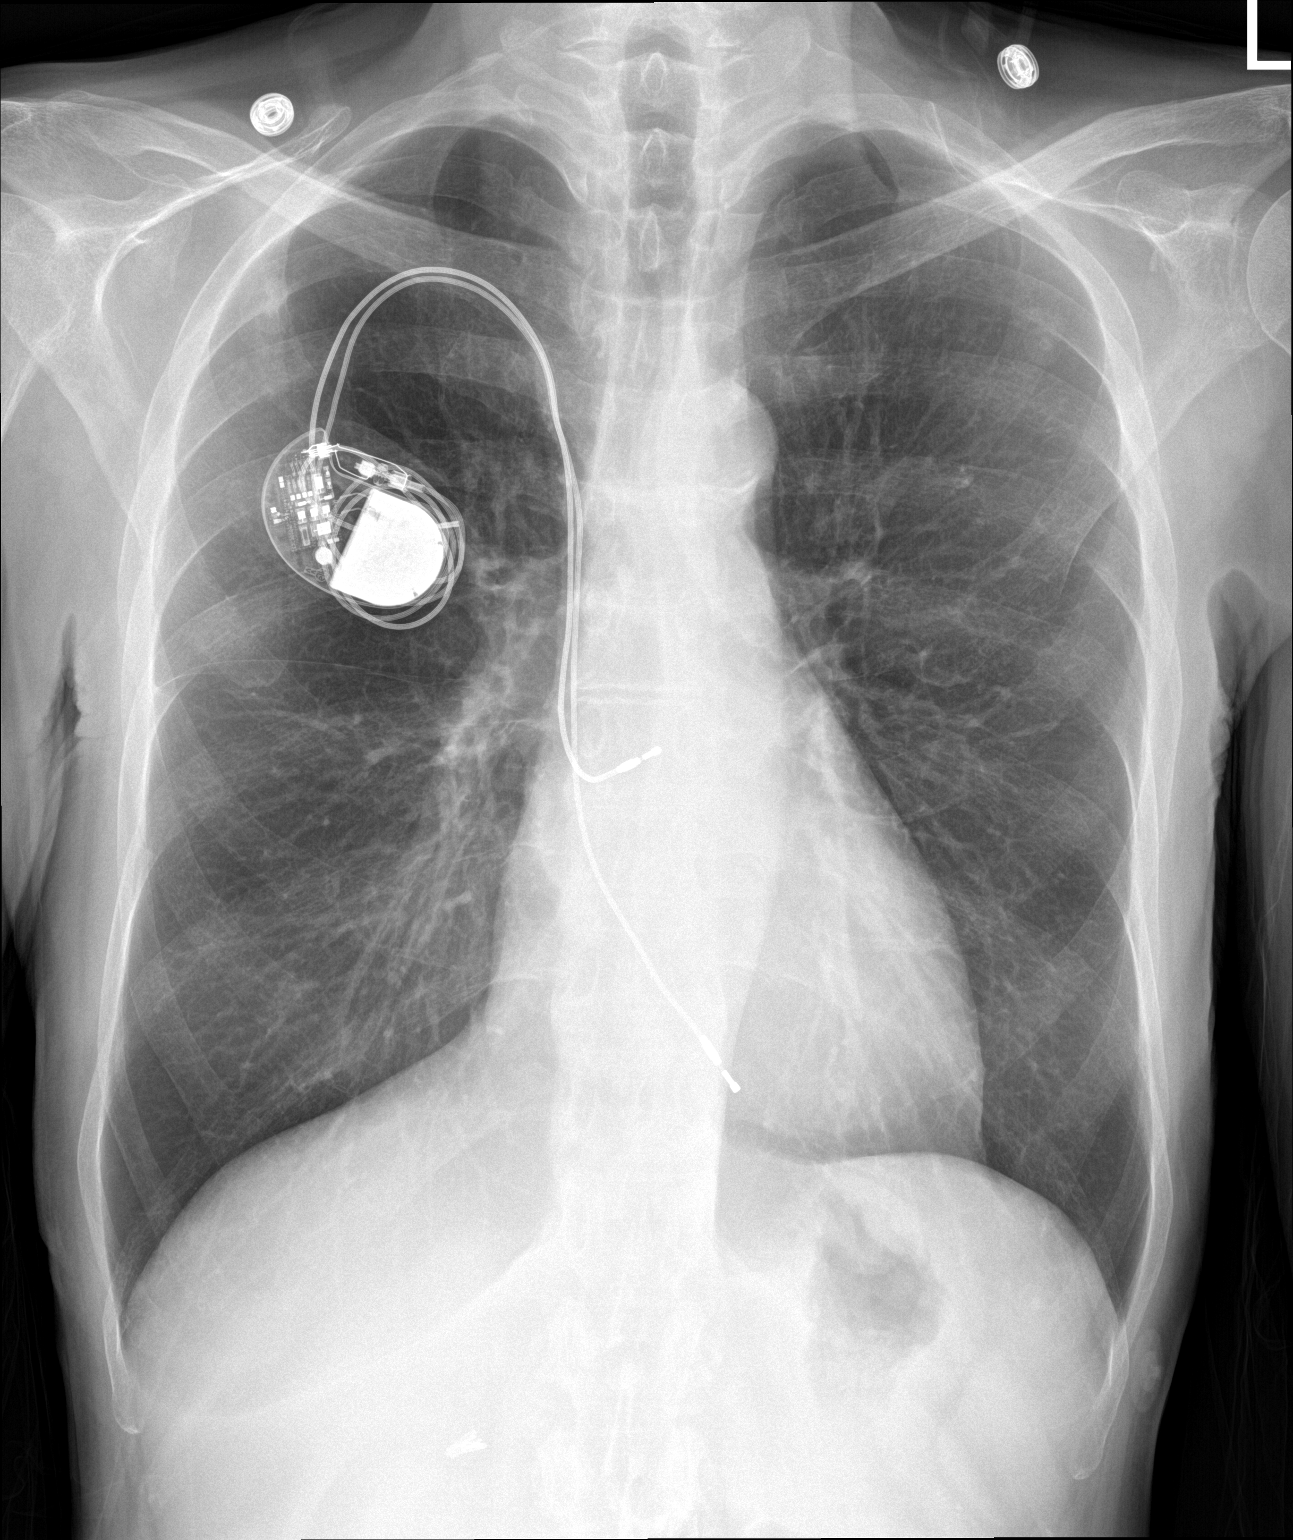

[chest lat]
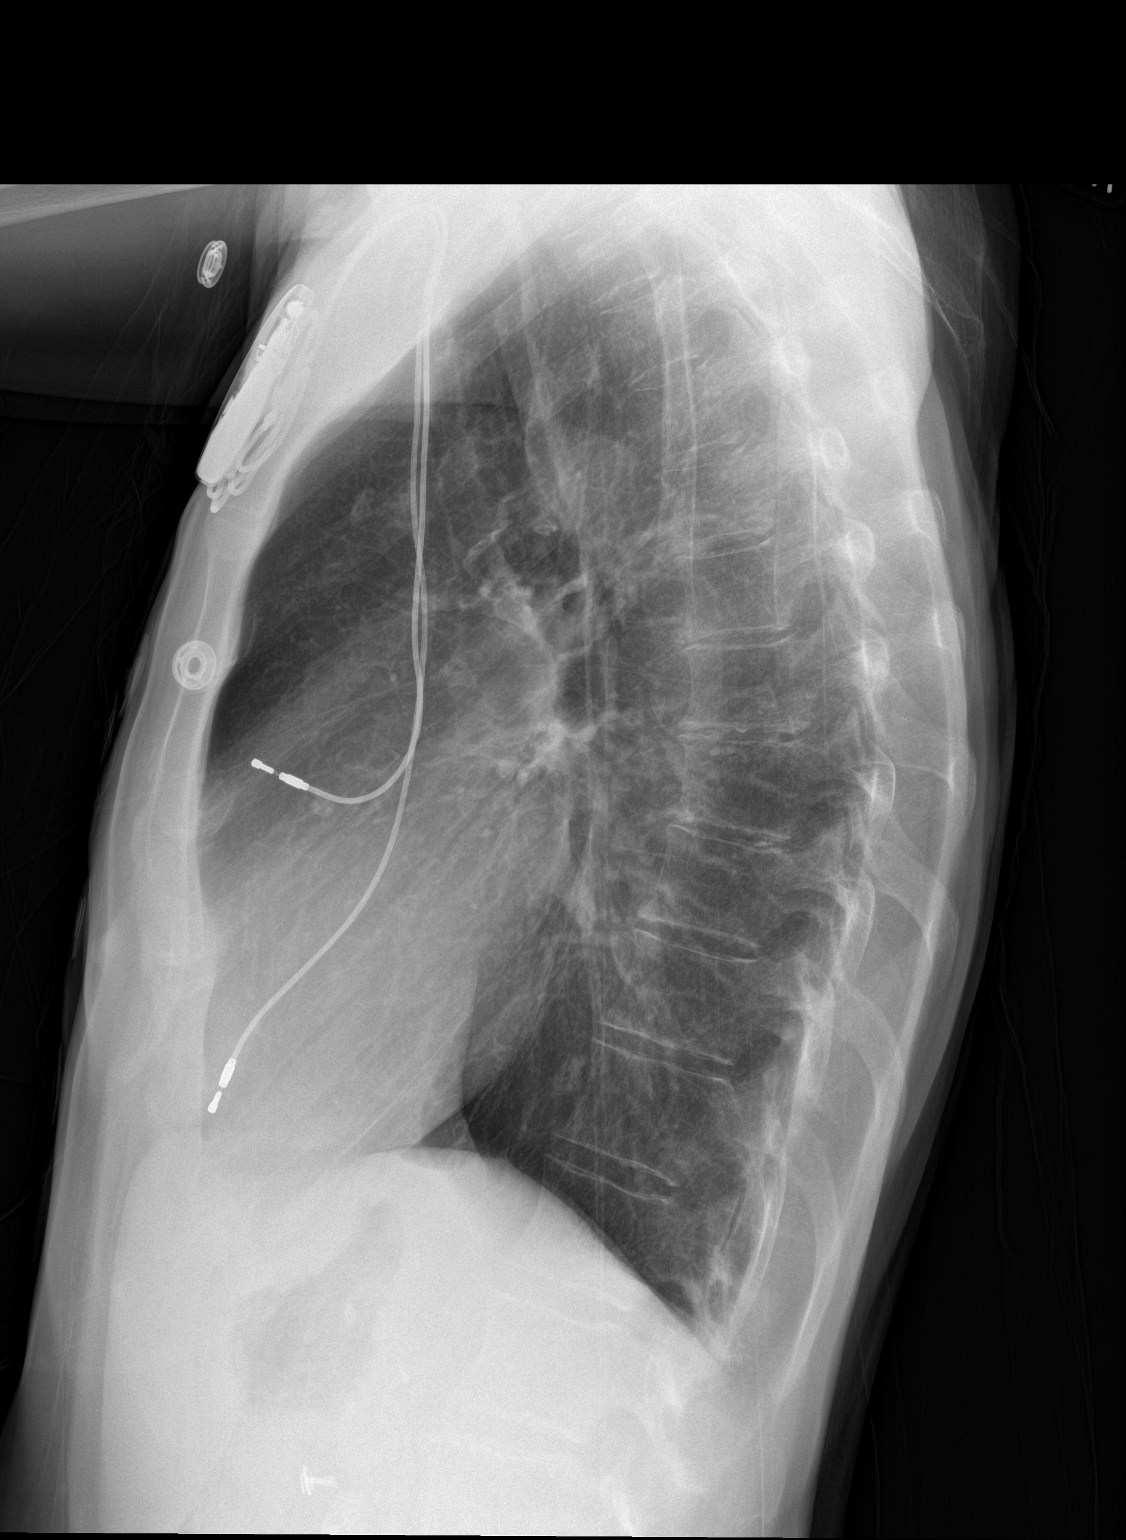

[2 of 2 positions shown; findings below may reference images not displayed]

FINDINGS: There is marked hyperinflation. Nodular-appearing opacity in the
right apex represents a benign focus of sclerosis in the right
posterior fourth rib, seen on the 11/23/2014 CT, combined with
monitor lead on the skin. The lungs are clear. The pulmonary
vasculature is normal. Hilar, mediastinal and cardiac contours are
unremarkable and unchanged. There is no pneumothorax. There is no
large effusion.

There are grossly intact appearances of the transvenous leads.
IMPRESSION: Hyperinflation.  No acute findings.
